# Patient Record
Sex: Female | Born: 1935 | Race: White | Hispanic: No | Marital: Married | Smoking: Never smoker
Health system: Southern US, Community
[De-identification: ages and names within clinical notes are randomized; demographics above are authoritative.]

## PROBLEM LIST (undated history)

## (undated) DIAGNOSIS — K859 Acute pancreatitis without necrosis or infection, unspecified: Secondary | ICD-10-CM

## (undated) DIAGNOSIS — D649 Anemia, unspecified: Secondary | ICD-10-CM

## (undated) DIAGNOSIS — Z8719 Personal history of other diseases of the digestive system: Secondary | ICD-10-CM

## (undated) DIAGNOSIS — R569 Unspecified convulsions: Secondary | ICD-10-CM

## (undated) DIAGNOSIS — N179 Acute kidney failure, unspecified: Secondary | ICD-10-CM

## (undated) DIAGNOSIS — F329 Major depressive disorder, single episode, unspecified: Secondary | ICD-10-CM

## (undated) DIAGNOSIS — I739 Peripheral vascular disease, unspecified: Secondary | ICD-10-CM

## (undated) DIAGNOSIS — L719 Rosacea, unspecified: Secondary | ICD-10-CM

## (undated) DIAGNOSIS — Z9889 Other specified postprocedural states: Secondary | ICD-10-CM

## (undated) DIAGNOSIS — F419 Anxiety disorder, unspecified: Secondary | ICD-10-CM

## (undated) DIAGNOSIS — K219 Gastro-esophageal reflux disease without esophagitis: Secondary | ICD-10-CM

## (undated) DIAGNOSIS — E785 Hyperlipidemia, unspecified: Secondary | ICD-10-CM

## (undated) DIAGNOSIS — M1990 Unspecified osteoarthritis, unspecified site: Secondary | ICD-10-CM

## (undated) DIAGNOSIS — I499 Cardiac arrhythmia, unspecified: Secondary | ICD-10-CM

## (undated) DIAGNOSIS — R112 Nausea with vomiting, unspecified: Secondary | ICD-10-CM

## (undated) DIAGNOSIS — I5032 Chronic diastolic (congestive) heart failure: Secondary | ICD-10-CM

## (undated) DIAGNOSIS — E039 Hypothyroidism, unspecified: Secondary | ICD-10-CM

## (undated) DIAGNOSIS — E079 Disorder of thyroid, unspecified: Secondary | ICD-10-CM

## (undated) DIAGNOSIS — M549 Dorsalgia, unspecified: Secondary | ICD-10-CM

## (undated) DIAGNOSIS — I6529 Occlusion and stenosis of unspecified carotid artery: Secondary | ICD-10-CM

## (undated) DIAGNOSIS — Z5189 Encounter for other specified aftercare: Secondary | ICD-10-CM

## (undated) DIAGNOSIS — K589 Irritable bowel syndrome without diarrhea: Secondary | ICD-10-CM

## (undated) DIAGNOSIS — F32A Depression, unspecified: Secondary | ICD-10-CM

## (undated) DIAGNOSIS — I639 Cerebral infarction, unspecified: Secondary | ICD-10-CM

## (undated) DIAGNOSIS — IMO0001 Reserved for inherently not codable concepts without codable children: Secondary | ICD-10-CM

## (undated) DIAGNOSIS — I10 Essential (primary) hypertension: Secondary | ICD-10-CM

## (undated) DIAGNOSIS — L659 Nonscarring hair loss, unspecified: Secondary | ICD-10-CM

## (undated) HISTORY — DX: Unspecified osteoarthritis, unspecified site: M19.90

## (undated) HISTORY — PX: ERCP: SHX60

## (undated) HISTORY — DX: Anxiety disorder, unspecified: F41.9

## (undated) HISTORY — PX: TONSILLECTOMY: SUR1361

## (undated) HISTORY — DX: Essential (primary) hypertension: I10

## (undated) HISTORY — PX: COLONOSCOPY: SHX174

## (undated) HISTORY — DX: Encounter for other specified aftercare: Z51.89

## (undated) HISTORY — DX: Gastro-esophageal reflux disease without esophagitis: K21.9

## (undated) HISTORY — DX: Reserved for inherently not codable concepts without codable children: IMO0001

## (undated) HISTORY — DX: Disorder of thyroid, unspecified: E07.9

## (undated) HISTORY — DX: Hyperlipidemia, unspecified: E78.5

## (undated) HISTORY — PX: ABDOMINAL HYSTERECTOMY: SHX81

## (undated) HISTORY — DX: Irritable bowel syndrome, unspecified: K58.9

## (undated) HISTORY — PX: BREAST REDUCTION SURGERY: SHX8

## (undated) HISTORY — DX: Dorsalgia, unspecified: M54.9

## (undated) HISTORY — PX: EYE SURGERY: SHX253

---

## 1986-12-13 HISTORY — PX: CHOLECYSTECTOMY: SHX55

## 2000-11-08 ENCOUNTER — Encounter: Admission: RE | Admit: 2000-11-08 | Discharge: 2000-11-08 | Payer: Self-pay | Admitting: *Deleted

## 2000-11-08 ENCOUNTER — Encounter: Payer: Self-pay | Admitting: *Deleted

## 2002-01-02 ENCOUNTER — Encounter (INDEPENDENT_AMBULATORY_CARE_PROVIDER_SITE_OTHER): Payer: Self-pay | Admitting: Specialist

## 2002-01-02 ENCOUNTER — Inpatient Hospital Stay (HOSPITAL_COMMUNITY): Admission: EM | Admit: 2002-01-02 | Discharge: 2002-01-04 | Payer: Self-pay | Admitting: Emergency Medicine

## 2003-01-29 ENCOUNTER — Encounter: Payer: Self-pay | Admitting: Internal Medicine

## 2003-01-29 ENCOUNTER — Encounter: Admission: RE | Admit: 2003-01-29 | Discharge: 2003-01-29 | Payer: Self-pay | Admitting: Internal Medicine

## 2003-12-14 HISTORY — PX: JOINT REPLACEMENT: SHX530

## 2004-02-07 ENCOUNTER — Ambulatory Visit (HOSPITAL_BASED_OUTPATIENT_CLINIC_OR_DEPARTMENT_OTHER): Admission: RE | Admit: 2004-02-07 | Discharge: 2004-02-07 | Payer: Self-pay | Admitting: Plastic Surgery

## 2004-02-07 ENCOUNTER — Ambulatory Visit (HOSPITAL_COMMUNITY): Admission: RE | Admit: 2004-02-07 | Discharge: 2004-02-07 | Payer: Self-pay | Admitting: Plastic Surgery

## 2004-02-07 ENCOUNTER — Encounter (INDEPENDENT_AMBULATORY_CARE_PROVIDER_SITE_OTHER): Payer: Self-pay | Admitting: Specialist

## 2004-02-08 ENCOUNTER — Inpatient Hospital Stay (HOSPITAL_COMMUNITY): Admission: EM | Admit: 2004-02-08 | Discharge: 2004-02-10 | Payer: Self-pay | Admitting: Emergency Medicine

## 2005-06-03 ENCOUNTER — Ambulatory Visit (HOSPITAL_COMMUNITY): Admission: RE | Admit: 2005-06-03 | Discharge: 2005-06-03 | Payer: Self-pay | Admitting: Gastroenterology

## 2007-04-28 ENCOUNTER — Ambulatory Visit: Admission: RE | Admit: 2007-04-28 | Discharge: 2007-04-28 | Payer: Self-pay | Admitting: Orthopedic Surgery

## 2007-05-31 ENCOUNTER — Inpatient Hospital Stay (HOSPITAL_COMMUNITY): Admission: RE | Admit: 2007-05-31 | Discharge: 2007-06-01 | Payer: Self-pay | Admitting: Orthopedic Surgery

## 2008-12-13 HISTORY — PX: BACK SURGERY: SHX140

## 2009-06-23 ENCOUNTER — Encounter: Admission: RE | Admit: 2009-06-23 | Discharge: 2009-06-23 | Payer: Self-pay | Admitting: Internal Medicine

## 2009-10-31 ENCOUNTER — Encounter: Admission: RE | Admit: 2009-10-31 | Discharge: 2009-10-31 | Payer: Self-pay | Admitting: Neurological Surgery

## 2010-06-29 ENCOUNTER — Encounter
Admission: RE | Admit: 2010-06-29 | Discharge: 2010-09-27 | Payer: Self-pay | Source: Home / Self Care | Admitting: Orthopedic Surgery

## 2010-09-28 ENCOUNTER — Encounter
Admission: RE | Admit: 2010-09-28 | Discharge: 2010-12-10 | Payer: Self-pay | Source: Home / Self Care | Attending: Orthopedic Surgery | Admitting: Orthopedic Surgery

## 2010-12-01 ENCOUNTER — Encounter
Admission: RE | Admit: 2010-12-01 | Discharge: 2010-12-08 | Payer: Self-pay | Source: Home / Self Care | Attending: Orthopedic Surgery | Admitting: Orthopedic Surgery

## 2010-12-14 ENCOUNTER — Encounter
Admission: RE | Admit: 2010-12-14 | Discharge: 2011-01-12 | Payer: Self-pay | Source: Home / Self Care | Attending: Orthopedic Surgery | Admitting: Orthopedic Surgery

## 2010-12-29 ENCOUNTER — Encounter: Admit: 2010-12-29 | Payer: Self-pay | Admitting: Orthopedic Surgery

## 2011-01-03 ENCOUNTER — Encounter: Payer: Self-pay | Admitting: Gastroenterology

## 2011-01-20 ENCOUNTER — Ambulatory Visit: Payer: Medicare Other | Attending: Orthopedic Surgery | Admitting: Rehabilitation

## 2011-01-20 DIAGNOSIS — IMO0001 Reserved for inherently not codable concepts without codable children: Secondary | ICD-10-CM | POA: Insufficient documentation

## 2011-01-20 DIAGNOSIS — M6281 Muscle weakness (generalized): Secondary | ICD-10-CM | POA: Insufficient documentation

## 2011-01-20 DIAGNOSIS — M546 Pain in thoracic spine: Secondary | ICD-10-CM | POA: Insufficient documentation

## 2011-01-20 DIAGNOSIS — R262 Difficulty in walking, not elsewhere classified: Secondary | ICD-10-CM | POA: Insufficient documentation

## 2011-01-21 ENCOUNTER — Ambulatory Visit: Payer: Medicare Other | Admitting: Rehabilitation

## 2011-01-26 ENCOUNTER — Ambulatory Visit: Payer: Medicare Other | Admitting: Rehabilitation

## 2011-01-27 ENCOUNTER — Ambulatory Visit: Payer: Medicare Other | Admitting: Rehabilitation

## 2011-01-28 ENCOUNTER — Encounter: Payer: Self-pay | Admitting: Rehabilitation

## 2011-02-03 ENCOUNTER — Ambulatory Visit: Payer: Medicare Other | Admitting: Rehabilitation

## 2011-02-04 ENCOUNTER — Encounter: Payer: Medicare Other | Admitting: Rehabilitation

## 2011-02-09 ENCOUNTER — Ambulatory Visit: Payer: Medicare Other | Admitting: Rehabilitation

## 2011-02-11 ENCOUNTER — Encounter: Payer: Medicare Other | Admitting: Rehabilitation

## 2011-02-24 ENCOUNTER — Encounter: Payer: Medicare Other | Admitting: Rehabilitation

## 2011-03-03 ENCOUNTER — Encounter: Payer: Medicare Other | Admitting: Rehabilitation

## 2011-03-09 ENCOUNTER — Ambulatory Visit: Payer: Medicare Other | Attending: Orthopedic Surgery | Admitting: Rehabilitation

## 2011-03-09 DIAGNOSIS — M6281 Muscle weakness (generalized): Secondary | ICD-10-CM | POA: Insufficient documentation

## 2011-03-09 DIAGNOSIS — IMO0001 Reserved for inherently not codable concepts without codable children: Secondary | ICD-10-CM | POA: Insufficient documentation

## 2011-03-09 DIAGNOSIS — R262 Difficulty in walking, not elsewhere classified: Secondary | ICD-10-CM | POA: Insufficient documentation

## 2011-03-09 DIAGNOSIS — M546 Pain in thoracic spine: Secondary | ICD-10-CM | POA: Insufficient documentation

## 2011-03-17 ENCOUNTER — Encounter: Payer: Medicare Other | Admitting: Rehabilitation

## 2011-03-31 ENCOUNTER — Encounter: Payer: Medicare Other | Admitting: Rehabilitation

## 2011-04-30 NOTE — Discharge Summary (Signed)
[place]. [facility]  Patient:    [name] [name], [name] [name] Visit Number: [id number] MRN: [id number]          Service Type: MED Location: [id number] Attending Physician:  Darnelle Bos Dictated by:   Theressa Millard, M.D. Admit Date:  01/02/2002 Discharge Date: 01/04/2002                             Discharge Summary  ADMISSION DIAGNOSIS:  Lower gastrointestinal bleed.  DISCHARGE DIAGNOSES: 1. Acute gastrointestinal bleed secondary to diverticular bleeding. 2. Acute blood loss anemia. 3. Hypertension. 4. Hypothyroidism.  Please see the admitting history and physical examination for details.  HOSPITAL COURSE:  The patient was admitted and her hemoglobin fell to 8.3. She was transfused with two units of packed cells.  Her hemoglobin came up to 10.8 and subsequently 11.1.  She was then colonoscoped by Dr. Laural Benes with findings of diverticular disease and two small polyps but no malignancy noted. She was discharged in improved condition.  DISCHARGE MEDICATIONS: 1. Lexapro 10 mg q.d. 2. Ativan at bedtime. 3. Tenoretic one daily. 4. Flovent two puffs b.i.d. 5. Prevacid 20 mg q.d. 6. Synthroid as before. 7. Xanax 0.5 mg b.i.d. 8. Lasix 40 mg q.d. 9. K-Dur three q.d.  FOLLOW-UP:  She will call to make and appointment to see me in approximately one week.  DIET:  No restrictions.  ACTIVITY:  No restrictions. Dictated by:   Theressa Millard, M.D. Attending Physician:  Darnelle Bos DD:  01/04/01 TD:  01/05/02 Job: [id number]

## 2011-04-30 NOTE — Op Note (Signed)
[name], [name]                 ACCOUNT NO.:  [id number]   MEDICAL RECORD NO.:  [id number]          PATIENT TYPE:  AMB   LOCATION:  ENDO                         FACILITY:  [facility]   PHYSICIAN:  Danise Edge, M.D.   DATE OF BIRTH:  Mar 23, 1936   DATE OF PROCEDURE:  06/03/2005  DATE OF DISCHARGE:                                 OPERATIVE REPORT   PROCEDURE:  Esophagogastroduodenoscopy with Savary esophageal dilatation.   PROCEDURE INDICATION:  [name] [name] is a 75 year old female, born  08-04-1936. [name] [name] has chronic heartburn and has intermittent solid  food dysphagia.   ENDOSCOPIST:  Danise Edge, M.D.   PREMEDICATION:  Versed 12 mg, Demerol 100 mg.   DESCRIPTION OF PROCEDURE:  After obtaining informed consent, [name] [name] was  placed in the left lateral decubitus position. I administered intravenous  Demerol and intravenous Versed to achieve conscious sedation for the  procedure. The patient's blood pressure, oxygen saturation, and cardiac  rhythm were monitored throughout the procedure and documented in the medical  record.   The Olympus gastroscope was passed through the posterior hypopharynx into  the proximal esophagus without difficulty. The hypopharynx, larynx, and  vocal cords appeared normal.   Esophagoscopy:  The proximal, mid, and lower segments of the esophageal  mucosa appear completely normal. There is no endoscopic evidence for the  presence of Barrett's esophagus, erosive esophagitis, esophageal mucosal  scarring, or esophageal stricture formation.   Gastroscopy:  Retroflex view of the gastric cardia and fundus was normal.  The gastric body, antrum, and pylorus appeared normal. There is a moderate  amount of retained solid food in the gastric body and antrum, compatible  with gastroparesis.  The pylorus is patent. and there are no signs of  gastric outlet obstruction.   Duodenoscopy:  The duodenal bulb and descending duodenum appeared  normal.   Savary esophageal dilation:  The Savary dilator wire was passed through the  gastroscope and the tip of the guide wire advanced to the distal gastric  antrum as confirmed endoscopically and fluoroscopically. Under fluoroscopic  guidance, the 15-mm Savary dilator passed without resistance. Repeat  esophagogastrostomy revealed an intact esophageal mucosa without signs of  stricture, dilation, and no gastric trauma due to the guide wire.   ASSESSMENT:  Chronic gastroesophageal reflux manifested by heartburn  associated with a possible gastroparesis and an esophageal motility disorder  accounting for her intermittent dysphagia.   PLAN:  I will place [name] [name] on sublingual hyoscyamine p.r.n. dysphagia.       MJ/MEDQ  D:  06/03/2005  T:  06/03/2005  Job:  [id number]   cc:   Theressa Millard, M.D.  [address] [place]  [place] [address]  Fax: [phone]

## 2011-04-30 NOTE — Consult Note (Signed)
NAME:  [name] [name], [name] [name]                           ACCOUNT NO.:  [id number]   MEDICAL RECORD NO.:  [id number]                   PATIENT TYPE:  INP   LOCATION:  0358                                 FACILITY:  [facility]   PHYSICIAN:  Georgann Housekeeper, M.D.                 DATE OF BIRTH:  Aug 17, 1936   DATE OF CONSULTATION:  02/09/2004  DATE OF DISCHARGE:                                   CONSULTATION   REFERRING PHYSICIAN:  Dr. Benna Dunks, plastic surgeon.   PRIMARY CARE PHYSICIAN:  Theressa Millard, M.D.   REASON FOR CONSULTATION:  Nausea, vomiting, and hypokalemia.   The patient is 75 years old with a past medical history of hypertension,  hypothyroidism, mild anxiety and had an outpatient procedure with breast  reduction surgery by Dr. Benna Dunks on February 07, 2004 uncomplicated.  Went  home.  The patient felt a little nauseous at the time of discharge, but the  last 24 hours unable to keep anything down, nausea and vomiting, no  diarrhea, no fevers, little bit of burning in the stomach.  No chest pain,  shortness of breath.  She did take some pain pills postop, Tylox, but never  had any problem with codeine.  Has not been able to take anything down.  Came in yesterday with a significant finding of hypokalemia with potassium  2.6.  She has been on Lasix and diuretics at home.  White count was mildly  elevated at 10.3 and mildly elevated liver function SGOT and SGPT.  Amylase  and lipase was negative.   PAST MEDICAL HISTORY:  1. Hypertension.  2. Hypothyroidism.  3. Anxiety.   SURGICAL HISTORY:  1. Gallbladder surgery.  2. Partial hysterectomy and liposuction.  3. Recent breast reduction on February 25th.   Lab data significant:  Potassium was 2.6, today is 3.3.  Normal BUN and  creatinine.  Sodium 141.  LFTs:  SGPT 103, SGOT 49, amylase and lipase 12  and 39 respectively.  UA was negative.   MEDICATION LIST AT HOME:  1. Tenoretic 50/25 mg one per day.  2. Potassium 10 mEq three times a  day.  3. Lasix 40 mg daily.  4. Effexor 37.5 mg q.h.s.  5. Synthroid 0.05 mg daily.   PHYSICAL EXAMINATION:  VITAL SIGNS:  Today, blood pressure 144/79,  temperature 97, 96% saturation, 90 heart rate normal sinus.  LUNGS:  Clear.  NECK:  Supple.  CARDIAC:  Normal S1 & S2 with no murmurs.  ABDOMEN:  Soft, slightly decreased bowel sounds without any tenderness or  distention.  BREASTS:  There are bandages on the breasts from the breast reduction.   LABORATORY DATA:  As mentioned above.  KUB upright obtained today showed no  air/fluid levels, no distention of the colon, some stool.   ASSESSMENT:  1. Nausea, vomiting, and hypokalemia.  Differential includes mild ileus with     some gastroenteritis.  I  suspect more ileus postop.  2. Hypertension.  3. Status post breast reduction surgery.   PLAN/RECOMMENDATION:  1. Continue with supportive care, NPO except meds, hold IV fluids with     potassium replacement.  Recheck BMET in the evening.  2. As far as blood pressure hold off Lasix and Tenoretic.  3. Hypothyroidism:  Continue on Synthroid and the Effexor.  4. The patient does not feel to be in pain.  She does not need any pain     medication at this point.  Will follow.                                               Georgann Housekeeper, M.D.    Arliss Journey  D:  02/09/2004  T:  02/09/2004  Job:  [id number]   cc:   Alfredia Ferguson, M.D.  [address] [address]  [place]  [place] [address]  Fax: [phone]

## 2011-04-30 NOTE — Procedures (Signed)
[place]. [facility]  Patient:    [name] [name], [name] [name] Visit Number: [id number] MRN: [id number]          Service Type: MED Location: [id number] Attending Physician:  Darnelle Bos Dictated by:   Verlin Grills, M.D. Proc. Date: 01/04/02 Admit Date:  01/02/2002 Discharge Date: 01/04/2002   CC:         Theressa Millard, M.D.                           Procedure Report  PROCEDURE:  Colonoscopy and polypectomy.  ENDOSCOPIST:  Verlin Grills, M.D.  INDICATIONS:  [name] [name] is a 75 year old female born Jun 04, 1936. She was hospitalized with hematochezia which has resolved.  PREMEDICATION:  Versed 15 mg, fentanyl 100 mcg.  ENDOSCOPE:  Olympus pediatric colonoscope.  DESCRIPTION OF PROCEDURE:  After obtaining informed consent, the patient was placed in the left lateral decubitus position.  I administered intravenous fentanyl and intravenous Versed to achieve conscious sedation for the procedure.  The patients blood pressure, oxygen saturation and cardiac rhythm were monitored throughout the procedure and documented in the medical record.  Anal inspection was normal.  Digital rectal exam was normal.  The Olympus pediatric videocolonoscope was introduced into the rectum and easily advanced to the cecum.  Colonic preparation for the exam today was satisfactory.  Rectum normal.  Sigmoid colon and descending colon:  From the distal sigmoid colon, a 1 mm sessile polyp was removed with electrocautery snare but not retrieved for pathological evaluation.  Left colonic diverticulosis noted.  Splenic flexure negative.  Transverse colon normal.  Hepatic flexure normal.  Ascending colon:  From the mid ascending colon a 1 mm sessile polyp was removed e with the electrocautery snare and submitted for pathologic interpretation.  Cecum and ileocecal valve:  Normal.  ASSESSMENT: 1. Left colonic diverticulosis. 2. A 1 mm sessile polyp  was removed from the mid ascending colon with    electrocautery snare and submitted for pathologic interpretation. 3. From the distal sigmoid colon, a 1 mm sessile polyp was removed with the    electrocautery snare but not retrieved for pathological evaluation.  RECOMMENDATIONS:  If ascending colon polyp returns neoplastic, [name] [name] should undergo a repeat colonoscopy in five years.  If her polyp is non-neoplastic, she should undergo a repeat colonoscopy in 10 years. Dictated by:   Verlin Grills, M.D. Attending Physician:  Darnelle Bos DD:  01/04/02 TD:  01/05/02 Job: [id number]

## 2011-09-29 LAB — CBC
HCT: 30.6 — ABNORMAL LOW
Hemoglobin: 10.4 — ABNORMAL LOW
MCV: 89.8
Platelets: 245
RDW: 12.3

## 2011-09-29 LAB — BASIC METABOLIC PANEL
BUN: 36 — ABNORMAL HIGH
Chloride: 99
Glucose, Bld: 146 — ABNORMAL HIGH
Potassium: 2.9 — ABNORMAL LOW
Sodium: 135

## 2011-09-30 LAB — COMPREHENSIVE METABOLIC PANEL
Albumin: 4.3
Alkaline Phosphatase: 48
BUN: 45 — ABNORMAL HIGH
Calcium: 9.8
Glucose, Bld: 97
Potassium: 4.3
Sodium: 141
Total Protein: 7.1

## 2011-09-30 LAB — URINE MICROSCOPIC-ADD ON

## 2011-09-30 LAB — PROTIME-INR
INR: 1
Prothrombin Time: 13

## 2011-09-30 LAB — CBC
HCT: 35.6 — ABNORMAL LOW
MCHC: 34.1
Platelets: 282
RDW: 12.6

## 2011-09-30 LAB — URINALYSIS, ROUTINE W REFLEX MICROSCOPIC
Hgb urine dipstick: NEGATIVE
Nitrite: NEGATIVE
Protein, ur: NEGATIVE
Specific Gravity, Urine: 1.01 (ref 1.005–1.035)
Urobilinogen, UA: 0.2

## 2012-02-15 DIAGNOSIS — E039 Hypothyroidism, unspecified: Secondary | ICD-10-CM | POA: Diagnosis not present

## 2012-02-15 DIAGNOSIS — I10 Essential (primary) hypertension: Secondary | ICD-10-CM | POA: Diagnosis not present

## 2012-02-15 DIAGNOSIS — N183 Chronic kidney disease, stage 3 unspecified: Secondary | ICD-10-CM | POA: Diagnosis not present

## 2012-02-15 DIAGNOSIS — R1031 Right lower quadrant pain: Secondary | ICD-10-CM | POA: Diagnosis not present

## 2012-02-15 DIAGNOSIS — Z1331 Encounter for screening for depression: Secondary | ICD-10-CM | POA: Diagnosis not present

## 2012-02-21 DIAGNOSIS — M545 Low back pain: Secondary | ICD-10-CM | POA: Diagnosis not present

## 2012-02-22 ENCOUNTER — Other Ambulatory Visit: Payer: Self-pay | Admitting: Internal Medicine

## 2012-02-28 ENCOUNTER — Ambulatory Visit
Admission: RE | Admit: 2012-02-28 | Discharge: 2012-02-28 | Disposition: A | Discharge status: Home / Self Care | Payer: Medicare Other | Source: Ambulatory Visit | Attending: Internal Medicine | Admitting: Internal Medicine

## 2012-02-28 DIAGNOSIS — R109 Unspecified abdominal pain: Secondary | ICD-10-CM | POA: Diagnosis not present

## 2012-02-28 DIAGNOSIS — K5730 Diverticulosis of large intestine without perforation or abscess without bleeding: Secondary | ICD-10-CM | POA: Diagnosis not present

## 2012-02-28 MED ORDER — IOHEXOL 300 MG/ML  SOLN
100.0000 mL | Freq: Once | INTRAMUSCULAR | Status: AC | PRN
  Administered 2012-02-28: 100 mL via INTRAVENOUS

## 2012-03-20 ENCOUNTER — Encounter (INDEPENDENT_AMBULATORY_CARE_PROVIDER_SITE_OTHER): Payer: Self-pay | Admitting: General Surgery

## 2012-03-22 ENCOUNTER — Ambulatory Visit (INDEPENDENT_AMBULATORY_CARE_PROVIDER_SITE_OTHER): Payer: Self-pay | Admitting: General Surgery

## 2012-03-23 ENCOUNTER — Telehealth (INDEPENDENT_AMBULATORY_CARE_PROVIDER_SITE_OTHER): Payer: Self-pay | Admitting: General Surgery

## 2012-03-23 ENCOUNTER — Encounter (INDEPENDENT_AMBULATORY_CARE_PROVIDER_SITE_OTHER): Payer: Self-pay | Admitting: General Surgery

## 2012-03-24 ENCOUNTER — Telehealth (INDEPENDENT_AMBULATORY_CARE_PROVIDER_SITE_OTHER): Payer: Self-pay

## 2012-03-24 NOTE — Telephone Encounter (Signed)
Pt notified that Dr Derrell Lolling has reviewed this case with Dr Michaell Cowing and we will be making appt with Him to see pt. Pt advised since this hernia is in an unusual area Dr Michaell Cowing will be seeing her. Info will be given to Alisha to call pt for appt.

## 2012-03-28 ENCOUNTER — Emergency Department (HOSPITAL_COMMUNITY): Payer: Medicare Other

## 2012-03-28 ENCOUNTER — Emergency Department (HOSPITAL_COMMUNITY)
Admission: EM | Admit: 2012-03-28 | Discharge: 2012-03-28 | Disposition: A | Discharge status: Home / Self Care | Payer: Medicare Other | Attending: Emergency Medicine | Admitting: Emergency Medicine

## 2012-03-28 ENCOUNTER — Encounter (HOSPITAL_COMMUNITY): Payer: Self-pay

## 2012-03-28 DIAGNOSIS — E079 Disorder of thyroid, unspecified: Secondary | ICD-10-CM | POA: Diagnosis not present

## 2012-03-28 DIAGNOSIS — I440 Atrioventricular block, first degree: Secondary | ICD-10-CM | POA: Diagnosis not present

## 2012-03-28 DIAGNOSIS — F4489 Other dissociative and conversion disorders: Secondary | ICD-10-CM | POA: Diagnosis not present

## 2012-03-28 DIAGNOSIS — K219 Gastro-esophageal reflux disease without esophagitis: Secondary | ICD-10-CM | POA: Diagnosis not present

## 2012-03-28 DIAGNOSIS — K589 Irritable bowel syndrome without diarrhea: Secondary | ICD-10-CM | POA: Diagnosis not present

## 2012-03-28 DIAGNOSIS — N189 Chronic kidney disease, unspecified: Secondary | ICD-10-CM | POA: Diagnosis not present

## 2012-03-28 DIAGNOSIS — R4182 Altered mental status, unspecified: Secondary | ICD-10-CM | POA: Insufficient documentation

## 2012-03-28 DIAGNOSIS — R51 Headache: Secondary | ICD-10-CM | POA: Diagnosis not present

## 2012-03-28 DIAGNOSIS — E785 Hyperlipidemia, unspecified: Secondary | ICD-10-CM | POA: Diagnosis not present

## 2012-03-28 DIAGNOSIS — I129 Hypertensive chronic kidney disease with stage 1 through stage 4 chronic kidney disease, or unspecified chronic kidney disease: Secondary | ICD-10-CM | POA: Diagnosis not present

## 2012-03-28 DIAGNOSIS — F29 Unspecified psychosis not due to a substance or known physiological condition: Secondary | ICD-10-CM | POA: Insufficient documentation

## 2012-03-28 DIAGNOSIS — Z0389 Encounter for observation for other suspected diseases and conditions ruled out: Secondary | ICD-10-CM | POA: Diagnosis not present

## 2012-03-28 DIAGNOSIS — M129 Arthropathy, unspecified: Secondary | ICD-10-CM | POA: Diagnosis not present

## 2012-03-28 DIAGNOSIS — M545 Low back pain: Secondary | ICD-10-CM | POA: Diagnosis not present

## 2012-03-28 LAB — POCT I-STAT, CHEM 8
BUN: 37 mg/dL — ABNORMAL HIGH (ref 6–23)
Calcium, Ion: 1.16 mmol/L (ref 1.12–1.32)
Chloride: 99 mEq/L (ref 96–112)
Creatinine, Ser: 1.1 mg/dL (ref 0.50–1.10)
Glucose, Bld: 118 mg/dL — ABNORMAL HIGH (ref 70–99)
HCT: 37 % (ref 36.0–46.0)
Potassium: 3.4 mEq/L — ABNORMAL LOW (ref 3.5–5.1)

## 2012-03-28 MED ORDER — ASPIRIN 81 MG PO CHEW
324.0000 mg | CHEWABLE_TABLET | Freq: Once | ORAL | Status: AC
  Administered 2012-03-28: 324 mg via ORAL
  Filled 2012-03-28: qty 4

## 2012-03-28 NOTE — ED Notes (Signed)
Per ems. Pt husband called stating that his wife was standing by the microwave and as he was trying to talk to her she was not responding and he states she did not recognize him.

## 2012-03-28 NOTE — Discharge Instructions (Signed)
I would like for you to take one aspirin daily and followup with your family doctor as soon as possible   Confusion Confusion is the inability to think with your usual speed or clarity. Confusion may come on quickly or slowly over time. How quickly the confusion comes on depends on the cause. Confusion can be due to any number of causes. CAUSES   Concussion, head injury, or head trauma.   Seizures.   Stroke.   Fever.   Senility.   Heightened emotional states like rage or terror.   Mental illness in which the person loses the ability to determine what is real and what is not (hallucinations).   Infections.   Toxic effects from alcohol, drugs, or prescription medicines.   Dehydration and an imbalance of salts in the body (electrolytes).   Lack of sleep.   Low blood sugar (diabetes).   Low levels of oxygen (for example from chronic lung disorders).   Drug interactions or other medication side effects.   Nutritional deficiencies, especially niacin, thiamine, vitamin C, or vitamin B.   Sudden drop in body temperature (hypothermia).   Illness in the elderly. Constipation can result in confusion. An elderly person who is hospitalized may become confused due to change in daily routine.  SYMPTOMS  People often describe their thinking as cloudy or unclear when they are confused. Confusion can also include feeling disoriented. That means you are unaware of where or who you are. You may also not know what the date or time is. If confused, you may also have difficulty paying attention, remembering and making decisions. Some people also act aggressively when they are confused.  DIAGNOSIS  The medical evaluation of confusion may include:  Blood and urine tests.   X-rays.   Brain and nervous system tests.   Analyzing your brain waves (electroencphalogram or EEG).   A special X-ray (MRI) of your head or other special studies.  Your physician will ask questions such as:  Do you  get days and nights mixed up?   Are you awake during regular sleep times?   Do you have trouble recognizing people?   Do you know where you are?   Do you know the date and time?   Does the confusion come and go?   Is the confusion quickly getting worse?   Has there been a recent illness?   Has there been a recent head injury?   Are you diabetic?   Do you have a lung disorder?   What medication are you taking?   Have you taken drugs or alcohol?  TREATMENT  An admission to the hospital may not be needed, but a confused person should not be left alone. Stay with a family member or friend until the confusion clears. Avoid alcohol, pain relievers or sedative drugs until you have fully recovered. Do not drive until your caregiver says it is okay. HOME CARE INSTRUCTIONS What family and friends can do:  To find out if someone is confused ask him or her their name, age, and the date. If the person is unsure or answers incorrectly, he or she is confused.   Always introduce yourself, no matter how well the person knows you.   Often remind the person of his or her location.   Place a calendar and clock near the confused person.   Talk about current events and plans for the day.   Try to keep the environment calm, quiet and peaceful.   Make sure the patient  keeps follow up appointments with their physician.  PREVENTION  Ways to prevent confusion:  Avoid alcohol.   Eat a balanced diet.   Get enough sleep.   Do not become isolated. Spend time with other people and make plans for your days.   Keep careful watch on your blood sugar levels if you are diabetic.  SEEK IMMEDIATE MEDICAL CARE IF:   You develop severe headaches, repeated vomiting, seizures, blackouts or slurred speech.   There is increasing confusion, weakness, numbness, restlessness or personality changes.   You develop a loss of balance, have marked dizziness, feel uncoordinated or fall.   You have  delusions, hallucinations or develop severe anxiety.   Your family members think you need to be rechecked.  Document Released: 01/06/2005 Document Revised: 11/18/2011 Document Reviewed: 09/03/2008 [facility] Patient Information 2012 [place], [place].

## 2012-03-28 NOTE — ED Provider Notes (Signed)
History     CSN: [id number]  Arrival date & time 03/28/12  8469   First MD Initiated Contact with Patient 03/28/12 [phone]      Chief Complaint  Patient presents with  . Altered Mental Status     HPI Per ems. Pt husband called stating that his wife was standing by the microwave and as he was trying to talk to her she was not responding and he states she did not recognize him.  Patient shortly returned to baseline and now has no complaints.  Patient does not have recollection of the events during this brief period.  Patient also states that she did take her pain medicines this morning which she normally does.  Patient suffers from chronic back pain.  Patient denies any lateralizing weakness, vision problems numbness or speech problems.  Past Medical History  Diagnosis Date  . Hypertension   . Hyperlipidemia   . Thyroid disease   . Arthritis   . GERD (gastroesophageal reflux disease)   . Anxiety   . IBS (irritable bowel syndrome)   . Back pain   . Chronic kidney disease     Past Surgical History  Procedure Date  . Back surgery   . Cholecystectomy     No family history on file.  History  Substance Use Topics  . Smoking status: Never Smoker   . Smokeless tobacco: Not on file  . Alcohol Use: Yes    OB History    Grav Para Term Preterm Abortions TAB SAB Ect Mult Living                  Review of Systems  All other systems reviewed and are negative.    Allergies  Yellow dyes (non-tartrazine)  Home Medications   Current Outpatient Rx  Name Route Sig Dispense Refill  . ALPRAZOLAM 1 MG PO TABS Oral Take 1 mg by mouth 4 (four) times daily.    . ATENOLOL 50 MG PO TABS Oral Take 50 mg by mouth daily.    . BUSPIRONE HCL 15 MG PO TABS Oral Take 30 mg by mouth 2 (two) times daily.    . CYCLOBENZAPRINE HCL 10 MG PO TABS Oral Take 10 mg by mouth every 8 (eight) hours as needed. For muscle spasms.    [name] ESOMEPRAZOLE MAGNESIUM 20 MG PO PACK Oral Take 20 mg by mouth daily.      . FENOFIBRATE 145 MG PO TABS Oral Take 145 mg by mouth daily.    . FUROSEMIDE 20 MG PO TABS Oral Take 20 mg by mouth daily.     [name] HYDROCHLOROTHIAZIDE 25 MG PO TABS Oral Take 25 mg by mouth daily.    [name] HYOSCYAMINE SULFATE ER 0.375 MG PO TB12 Oral Take 0.375 mg by mouth every 12 (twelve) hours as needed.    [name] LEVOTHYROXINE SODIUM 112 MCG PO TABS Oral Take 112 mcg by mouth daily.    [name] LIDOCAINE 5 % EX PTCH Transdermal Place 1-3 patches onto the skin daily. Remove & Discard patch within 12 hours or as directed by MD    . OXYCODONE HCL 15 MG PO TABS Oral Take 15-30 mg by mouth every 6 (six) hours as needed. For pain.    [name] POTASSIUM CHLORIDE CRYS ER 20 MEQ PO TBCR Oral Take 20 mEq by mouth 2 (two) times daily.    . VENLAFAXINE HCL ER 150 MG PO CP24 Oral Take 150 mg by mouth daily.    [name] ZOLPIDEM TARTRATE 10 MG  PO TABS Oral Take 10 mg by mouth at bedtime as needed. For sleep.      BP 115/45  Pulse 63  Temp(Src) 98.3 F (36.8 C) (Oral)  Resp 15  SpO2 95%  Physical Exam  Nursing note and vitals reviewed. Constitutional: She is oriented to person, place, and time. She appears well-developed and well-nourished. No distress.  HENT:  Head: Normocephalic and atraumatic.  Eyes: Pupils are equal, round, and reactive to light.  Neck: Normal range of motion.  Cardiovascular: Normal rate and intact distal pulses.          Date: 03/28/2012  Rate: 65  Rhythm: normal sinus rhythm  QRS Axis: normal  Intervals: PR prolonged  ST/T Wave abnormalities: nonspecific T wave changes  Conduction Disutrbances:first-degree A-V block :   Old EKG Reviewed: changes noted     Pulmonary/Chest: No respiratory distress.  Abdominal: Normal appearance. She exhibits no distension.  Musculoskeletal: Normal range of motion.  Neurological: She is alert and oriented to person, place, and time. No cranial nerve deficit or sensory deficit. GCS eye subscore is 4. GCS verbal subscore is 5. GCS motor subscore is 6.  Skin:  Skin is warm and dry. No rash noted.  Psychiatric: She has a normal mood and affect. Her behavior is normal.    ED Course  Procedures (including critical care time)  Labs Reviewed  POCT I-STAT, CHEM 8 - Abnormal; Notable for the following:    Potassium 3.4 (*)    BUN 37 (*)    Glucose, Bld 118 (*)    All other components within normal limits  LAB REPORT - SCANNED   Ct Head Wo Contrast  03/28/2012  *RADIOLOGY REPORT*  Clinical Data: Confusion. Transient inability to speak. Pain in the head.  CT HEAD WITHOUT CONTRAST  Technique:  Contiguous axial images were obtained from the base of the skull through the vertex without contrast.  Comparison: None.  Findings: The brain stem, cerebellum, cerebral peduncles, thalami, basal ganglia, basilar cisterns, and ventricular system appear unremarkable.  No intracranial hemorrhage, mass lesion, or acute infarction is identified.  The visualized paranasal sinuses appear clear.  IMPRESSION:  No significant abnormality identified.  Original Report Authenticated By: Dellia Cloud, M.D.     1. Change in mental status       MDM         Nelia Shi, MD 03/29/12 [phone]

## 2012-04-18 ENCOUNTER — Ambulatory Visit (INDEPENDENT_AMBULATORY_CARE_PROVIDER_SITE_OTHER): Payer: Medicare Other | Admitting: Surgery

## 2012-04-18 ENCOUNTER — Encounter (INDEPENDENT_AMBULATORY_CARE_PROVIDER_SITE_OTHER): Payer: Self-pay | Admitting: Surgery

## 2012-04-18 VITALS — BP 132/68 | HR 60 | Temp 97.2°F | Resp 12 | Ht 61.0 in | Wt 138.2 lb

## 2012-04-18 DIAGNOSIS — K458 Other specified abdominal hernia without obstruction or gangrene: Secondary | ICD-10-CM | POA: Diagnosis not present

## 2012-04-18 NOTE — Patient Instructions (Signed)
Hernia  A hernia occurs when an internal organ pushes out through a weak spot in the abdominal wall. Hernias most commonly occur in the groin and around the navel. Hernias often can be pushed back into place (reduced). Most hernias tend to get worse over time. Some abdominal hernias can get stuck in the opening (irreducible or incarcerated hernia) and cannot be reduced. An irreducible abdominal hernia which is tightly squeezed into the opening is at risk for impaired blood supply (strangulated hernia). A strangulated hernia is a medical emergency. Because of the risk for an irreducible or strangulated hernia, surgery may be recommended to repair a hernia.  CAUSES    Heavy lifting.   Prolonged coughing.   Straining to have a bowel movement.   A cut (incision) made during an abdominal surgery.  HOME CARE INSTRUCTIONS    Bed rest is not required. You may continue your normal activities.   Avoid lifting more than 10 pounds (4.5 kg) or straining.   Cough gently. If you are a smoker it is best to stop. Even the best hernia repair can break down with the continual strain of coughing. Even if you do not have your hernia repaired, a cough will continue to aggravate the problem.   Do not wear anything tight over your hernia. Do not try to keep it in with an outside bandage or truss. These can damage abdominal contents if they are trapped within the hernia sac.   Eat a normal diet.   Avoid constipation. Straining over long periods of time will increase hernia size and encourage breakdown of repairs. If you cannot do this with diet alone, stool softeners may be used.  SEEK IMMEDIATE MEDICAL CARE IF:    You have a fever.   You develop increasing abdominal pain.   You feel nauseous or vomit.   Your hernia is stuck outside the abdomen, looks discolored, feels hard, or is tender.   You have any changes in your bowel habits or in the hernia that are unusual for you.   You have increased pain or swelling around the  hernia.   You cannot push the hernia back in place by applying gentle pressure while lying down.  MAKE SURE YOU:    Understand these instructions.   Will watch your condition.   Will get help right away if you are not doing well or get worse.  Document Released: 11/29/2005 Document Revised: 11/18/2011 Document Reviewed: 07/18/2008  [employer] Patient Information 2012 [employer].

## 2012-04-18 NOTE — Progress Notes (Signed)
Subjective:     Patient ID: [name] [name], female   DOB: Aug 29, 1936, 76 y.o.   MRN: [id number]  HPI  KATHIE POSA  03/12/1936 [id number]  Patient Care Team: Darnelle Bos, MD as PCP - General (Internal Medicine)  This patient is a 76 y.o.female who presents today for surgical evaluation at the request of Dr. Earl Gala.   Reason for visit: Right posterior flank hernia.  The patient is a pleasant elderly woman with numerous back issues including scoliosis. She was referred by Dr. Yetta Barre to [facility]. She underwent complex back surgery with bilateral releases. She recovered from that. That was back in 2010. However, she noticed an abnormal lump on her right side. It has gotten bigger. It is uncomfortable for her. No nausea or vomiting. No fevers or chills. Pain can radiate around the side.  She noticed her primary care physician. CAT scan revealed evidence of small bowel exiting the right posterior flank consistent with a flank hernia. She was sent to [place] for surgical evaluation.  Patient Active Problem List  Diagnoses  . Hernia of Right posterior flank    Past Medical History  Diagnosis Date  . Hypertension   . Hyperlipidemia   . Thyroid disease   . Arthritis   . GERD (gastroesophageal reflux disease)   . Anxiety   . IBS (irritable bowel syndrome)   . Back pain   . Chronic kidney disease   . Blood transfusion     at age of 4    Past Surgical History  Procedure Date  . Back surgery   . Cholecystectomy   . Tonsillectomy   . Abdominal hysterectomy     partial    History   Social History  . Marital Status: Married    Spouse Name: N/A    Number of Children: N/A  . Years of Education: N/A   Occupational History  . Not on file.   Social History Main Topics  . Smoking status: Never Smoker   . Smokeless tobacco: Not on file  . Alcohol Use: Yes  . Drug Use: No  . Sexually Active:    Other Topics Concern  . Not on file   Social History Narrative   . No narrative on file    Family History  Problem Relation Age of Onset  . Heart disease Mother   . Cancer Father     lung    Current Outpatient Prescriptions  Medication Sig Dispense Refill  . ALPRAZolam (XANAX) 1 MG tablet Take 1 mg by mouth 4 (four) times daily.      [name] atenolol (TENORMIN) 50 MG tablet Take 50 mg by mouth daily.      . busPIRone (BUSPAR) 15 MG tablet Take 30 mg by mouth 2 (two) times daily.      . cyclobenzaprine (FLEXERIL) 10 MG tablet Take 10 mg by mouth every 8 (eight) hours as needed. For muscle spasms.      [name] esomeprazole (NEXIUM) 20 MG packet Take 20 mg by mouth daily.       . fenofibrate (TRICOR) 145 MG tablet Take 145 mg by mouth daily.      . furosemide (LASIX) 20 MG tablet Take 20 mg by mouth daily.       . hydrochlorothiazide (HYDRODIURIL) 25 MG tablet Take 25 mg by mouth daily.      . hyoscyamine (LEVBID) 0.375 MG 12 hr tablet Take 0.375 mg by mouth every 12 (twelve) hours as needed.      [name]  levothyroxine (SYNTHROID, LEVOTHROID) 112 MCG tablet Take 112 mcg by mouth daily.      [name] lidocaine (LIDODERM) 5 % Place 1-3 patches onto the skin daily. Remove & Discard patch within 12 hours or as directed by MD      . oxyCODONE (ROXICODONE) 15 MG immediate release tablet Take 15-30 mg by mouth every 6 (six) hours as needed. For pain.      . potassium chloride SA (K-DUR,KLOR-CON) 20 MEQ tablet Take 20 mEq by mouth 2 (two) times daily.      [name] venlafaxine (EFFEXOR-XR) 150 MG 24 hr capsule Take 150 mg by mouth daily.      [name] zolpidem (AMBIEN) 10 MG tablet Take 10 mg by mouth at bedtime as needed. For sleep.         Allergies  Allergen Reactions  . Yellow Dyes (Non-Tartrazine) Itching    BP 132/68  Pulse 60  Temp(Src) 97.2 F (36.2 C) (Temporal)  Resp 12  Ht 5\' 1"  (1.549 m)  Wt 138 lb 3.2 oz (62.687 kg)  BMI 26.11 kg/m2     Review of Systems  Constitutional: Negative for fever, chills, diaphoresis, appetite change and fatigue.  HENT: Positive for hearing  loss. Negative for ear pain, sore throat, trouble swallowing, neck pain and ear discharge.   Eyes: Negative for photophobia, discharge and visual disturbance.  Respiratory: Negative for cough, choking, chest tightness and shortness of breath.   Cardiovascular: Negative for chest pain and palpitations.       Patient walks 60 minutes for about 2 miles without difficulty on treadmill.  No exertional chest/neck/shoulder/arm pain.   Gastrointestinal: Negative for nausea, vomiting, abdominal pain, diarrhea, constipation, anal bleeding and rectal pain.       No personal nor family history of GI/colon cancer, inflammatory bowel disease, allergy such as Celiac Sprue, dietary/dairy problems, colitis, ulcers nor gastritis.   Possible IBS but no severe Constipation/Diarrhea  No recent sick contacts/gastroenteritis.  No travel outside the country.  No changes in diet.    Genitourinary: Negative for dysuria, frequency and difficulty urinating.  Musculoskeletal: Positive for myalgias and back pain. Negative for gait problem.  Skin: Negative for color change, pallor and rash.  Neurological: Negative for dizziness, speech difficulty, weakness and numbness.  Hematological: Negative for adenopathy. Bruises/bleeds easily.  Psychiatric/Behavioral: Negative for confusion and agitation. The patient is not nervous/anxious.        Objective:   Physical Exam  Constitutional: She is oriented to person, place, and time. She appears well-developed and well-nourished. No distress.  HENT:  Head: Normocephalic.  Mouth/Throat: Oropharynx is clear and moist. No oropharyngeal exudate.  Eyes: Conjunctivae and EOM are normal. Pupils are equal, round, and reactive to light. No scleral icterus.  Neck: Normal range of motion. Neck supple. No tracheal deviation present.  Cardiovascular: Normal rate, regular rhythm and intact distal pulses.   Pulmonary/Chest: Effort normal and breath sounds normal. No respiratory distress. She  exhibits no tenderness.  Abdominal: Soft. She exhibits no distension and no mass. There is no tenderness. Hernia confirmed negative in the right inguinal area and confirmed negative in the left inguinal area.  Genitourinary: No vaginal discharge found.  Musculoskeletal: Normal range of motion. She exhibits no tenderness.       Arms: Lymphadenopathy:    She has no cervical adenopathy.       Right: No inguinal adenopathy present.       Left: No inguinal adenopathy present.  Neurological: She is alert and oriented to person, place,  and time. No cranial nerve deficit. She exhibits normal muscle tone. Coordination normal.  Skin: Skin is warm and dry. No rash noted. She is not diaphoretic. No erythema.  Psychiatric: She has a normal mood and affect. Her behavior is normal. Judgment and thought content normal.       Assessment:     Right posterior flank hernia s/p numerous back surgeries    Plan:     I think she would benefit from repair especially since a loop of intestine is going up into this hernia. It is causing a lot of pain and discomfort in her. She and her husband are very interested in getting this repaired. It is reasonable start a laparoscopic approach with her in a 90 decubitus position. Probably split at the kidney level to help open up the area.  The anatomy & physiology of the abdominal wall was discussed.  The pathophysiology of hernias was discussed.  Natural history risks without surgery including progeressive enlargement, pain, incarceration & strangulation was discussed.   Contributors to complications such as smoking, obesity, diabetes, prior surgery, etc were discussed.   I feel the risks of no intervention will lead to serious problems that outweigh the operative risks; therefore, I recommended surgery to reduce and repair the hernia.  I explained laparoscopic techniques with possible need for an open approach.  I noted the probable use of mesh to patch and/or buttress the  hernia repair  Risks such as bleeding, infection, abscess, need for further treatment, heart attack, death, and other risks were discussed.  I noted a good likelihood this will help address the problem.   Goals of post-operative recovery were discussed as well.  Possibility that this will not correct all symptoms was explained.  I stressed the importance of low-impact activity, aggressive pain control, avoiding constipation, & not pushing through pain to minimize risk of post-operative chronic pain or injury. Possibility of reherniation especially with smoking, obesity, diabetes, immunosuppression, and other health conditions was discussed.  We will work to minimize complications.     An educational handout further explaining the pathology & treatment options was given as well.  Questions were answered.  The patient expresses understanding & wishes to proceed with surgery.

## 2012-04-26 DIAGNOSIS — M545 Low back pain: Secondary | ICD-10-CM | POA: Diagnosis not present

## 2012-05-29 ENCOUNTER — Encounter (HOSPITAL_COMMUNITY): Payer: Self-pay | Admitting: Pharmacy Technician

## 2012-06-01 ENCOUNTER — Encounter (HOSPITAL_COMMUNITY)
Admission: RE | Admit: 2012-06-01 | Discharge: 2012-06-01 | Disposition: A | Discharge status: Home / Self Care | Payer: Medicare Other | Source: Ambulatory Visit | Attending: Surgery | Admitting: Surgery

## 2012-06-01 ENCOUNTER — Encounter (HOSPITAL_COMMUNITY): Payer: Self-pay

## 2012-06-01 DIAGNOSIS — K660 Peritoneal adhesions (postprocedural) (postinfection): Secondary | ICD-10-CM | POA: Diagnosis not present

## 2012-06-01 DIAGNOSIS — R109 Unspecified abdominal pain: Secondary | ICD-10-CM | POA: Diagnosis not present

## 2012-06-01 DIAGNOSIS — F411 Generalized anxiety disorder: Secondary | ICD-10-CM | POA: Diagnosis not present

## 2012-06-01 DIAGNOSIS — K219 Gastro-esophageal reflux disease without esophagitis: Secondary | ICD-10-CM | POA: Diagnosis not present

## 2012-06-01 DIAGNOSIS — I10 Essential (primary) hypertension: Secondary | ICD-10-CM | POA: Diagnosis not present

## 2012-06-01 DIAGNOSIS — Z79899 Other long term (current) drug therapy: Secondary | ICD-10-CM | POA: Diagnosis not present

## 2012-06-01 DIAGNOSIS — K589 Irritable bowel syndrome without diarrhea: Secondary | ICD-10-CM | POA: Diagnosis not present

## 2012-06-01 DIAGNOSIS — K432 Incisional hernia without obstruction or gangrene: Secondary | ICD-10-CM | POA: Diagnosis not present

## 2012-06-01 DIAGNOSIS — M549 Dorsalgia, unspecified: Secondary | ICD-10-CM | POA: Diagnosis not present

## 2012-06-01 DIAGNOSIS — K4090 Unilateral inguinal hernia, without obstruction or gangrene, not specified as recurrent: Secondary | ICD-10-CM | POA: Diagnosis not present

## 2012-06-01 DIAGNOSIS — E785 Hyperlipidemia, unspecified: Secondary | ICD-10-CM | POA: Diagnosis not present

## 2012-06-01 DIAGNOSIS — E039 Hypothyroidism, unspecified: Secondary | ICD-10-CM | POA: Diagnosis not present

## 2012-06-01 DIAGNOSIS — M129 Arthropathy, unspecified: Secondary | ICD-10-CM | POA: Diagnosis not present

## 2012-06-01 HISTORY — DX: Anemia, unspecified: D64.9

## 2012-06-01 HISTORY — DX: Hypothyroidism, unspecified: E03.9

## 2012-06-01 HISTORY — DX: Personal history of other diseases of the digestive system: Z87.19

## 2012-06-01 LAB — BASIC METABOLIC PANEL
CO2: 32 mEq/L (ref 19–32)
Chloride: 98 mEq/L (ref 96–112)
Creatinine, Ser: 1.08 mg/dL (ref 0.50–1.10)
Glucose, Bld: 110 mg/dL — ABNORMAL HIGH (ref 70–99)

## 2012-06-01 LAB — CBC
Hemoglobin: 13.8 g/dL (ref 12.0–15.0)
MCH: 29.1 pg (ref 26.0–34.0)
MCV: 86.3 fL (ref 78.0–100.0)
Platelets: 260 10*3/uL (ref 150–400)
RBC: 4.75 MIL/uL (ref 3.87–5.11)
WBC: 8.6 10*3/uL (ref 4.0–10.5)

## 2012-06-01 NOTE — Pre-Procedure Instructions (Signed)
LOV of 02/15/12 with PCP - Dr Theressa Millard on chart

## 2012-06-01 NOTE — Progress Notes (Signed)
06/01/12 Dr Michaell Cowing ,       Just wanted to give you some information regarding this patient if you are not already aware.   Patient presented to ER on 03/28/12 with " Altered Mental Status" . CT of head done 03/28/12 - negative. Pt had EKG done with confirmed EKG reading " Consider anterior infarct. "  On 03/28/12.  Pt has had not followup with PCP done since ER visit of 03/28/12.  Last visit with Dr Earl Gala who is PCP was on 02/15/12.  Pt does not report any problems on preop visit done today.        Also I noted on CT abdomen/pelvis done 02/28/12 lung nodule noted in left lung.        Followup chest CT was recommended at 6-12 months.  Just wanted to make sure you were aware of above.

## 2012-06-01 NOTE — Pre-Procedure Instructions (Signed)
Teach Back Method of teaching used for preop appointment.

## 2012-06-01 NOTE — Pre-Procedure Instructions (Signed)
06/01/12 Patient reports history of yellow dye allergy.  Called pharmacy and asked if yellow dyes in betasept.  Pharmacy stated not listed in ingredients but Pharmacy stated there " probably was ".  Patient instructed to use Dial Soap for preop shower nite before and am of surgery.  Patient stated she could use Dial Soap without a problem.

## 2012-06-01 NOTE — Patient Instructions (Signed)
22 [name] [name]  06/01/2012   Your procedure is scheduled on:  06/08/12 0730am-1100am  Report to [facility] at 0530 AM.  Call this number if you have problems the morning of surgery: [phone]   Remember:   Do not eat food:After Midnight.  May have clear liquids:until Midnight . [name]  Take these medicines the morning of surgery with A SIP OF WATER:   Do not wear jewelry, make-up or nail polish.  Do not wear lotions, powders, or perfumes.   Do not shave 48 hours prior to surgery  Do not bring valuables to the hospital.  Contacts, dentures or bridgework may not be worn into surgery.  Leave suitcase in the car. After surgery it may be brought to your room.  For patients admitted to the hospital, checkout time is 11:00 AM the day of discharge.       Special Instructions: CHG Shower Use Special Wash: 1/2 bottle night before surgery and 1/2 bottle morning of surgery. shower chin to toes with CHG.  Wash face and private parts with regular soap.    Please read over the following fact sheets that you were given: MRSA Information, coughing and deep breathing exercises, leg exercises

## 2012-06-01 NOTE — Pre-Procedure Instructions (Signed)
06/01/12 Requested last office visit note from Dr Earl Gala- PCP.  They are to fax.  Requested due to Ct abdomen and pelvis done 02/28/12 to see if any followup done or notes regarding nodule in lung.   Also, EKG shows"  consider anterior infarct"  done on 03/28/12 at time of ER Visit for Altered Mental Status.  Pt states she has not had any followup since ER visit of 03/28/12.Pt denies any history of cardiac problems.  EKG from 2005 shows normal sinus rhythm.  Both EKGS are located on chart .   CT of Head done.   03/28/12 located on chart from ER visit.  Patient denies any problems at time of preop visit done 06/01/12.

## 2012-06-05 ENCOUNTER — Telehealth (INDEPENDENT_AMBULATORY_CARE_PROVIDER_SITE_OTHER): Payer: Self-pay | Admitting: General Surgery

## 2012-06-05 NOTE — Telephone Encounter (Signed)
If she has an abscess, surgery must be postponed.  No placement of mesh with an active infection occurring!

## 2012-06-05 NOTE — Telephone Encounter (Signed)
Pt called her dentist because she has a toothache.  DDS called in Pen VK to her take.  She is scheduled for surgery on Thursday.  Concerned she surgery will be cancelled.  She was not seen at the dentist today, only called them.  Please advise so I can call her back.

## 2012-06-05 NOTE — Progress Notes (Signed)
Patient called and reported that she had been seen by dentist for a toothache on 06/05/12.  Patient reported that she has been placed on Penicillin 500mg  every 6 hours by mouth for a total of 30 pills.  Patient instructed to call and to inform Dr Michaell Cowing at [phone].

## 2012-06-06 ENCOUNTER — Telehealth (INDEPENDENT_AMBULATORY_CARE_PROVIDER_SITE_OTHER): Payer: Self-pay

## 2012-06-06 NOTE — Telephone Encounter (Signed)
Called pt to notify her that the hospital notified Dr Michaell Cowing about her having a toothache and had to be started on Penicillin. The pt is scheduled for surgery on 06/08/12 to have hernia repair but Dr Michaell Cowing wants to delay her surgery if she is having an infection. I notified pt of Dr Gordy Savers recommendations about r/s and the pt is not happy with r/s her surgery. The pt put her husband on the phone which he started to tell me how we needed to think about this before we just r/s her sx b/c he is worried that something wrong is going to happen with the hernia if not fixed this week. I explained to the husband like I did the pt if she has an infection anywhere in her body at the time we do the hernia repair you are putting yourself at a high risk of having your body reject the mesh. I told the pt that Dr Michaell Cowing is the one recommending this but I will page him again and go over this with him. I will call the pt back.

## 2012-06-06 NOTE — Telephone Encounter (Signed)
Called pt after I spoke to Dr Michaell Cowing again about the toothache. Per Dr Michaell Cowing if the pt can go see her dentist today to prove she does not have an active infection in her mouth and can stop the Penicilling then the pt will be able to keep the surgery as planned. If the dentist see's an infection then we will have to r/s surgery. The husband will call the pt's dentist today to see about an appt. The husband will call me back to let me know what is going on.

## 2012-06-06 NOTE — Telephone Encounter (Signed)
Returned pt's call. The pt did go and speak with her dentist today Dr Idelle Leech who said she would have no mouth infection by her surgery date. Dr Idelle Leech requested to speak with Dr Michaell Cowing so I am paging Dr Michaell Cowing to call Dr Idelle Leech [phone].

## 2012-06-08 ENCOUNTER — Encounter (HOSPITAL_COMMUNITY): Payer: Self-pay | Admitting: Anesthesiology

## 2012-06-08 ENCOUNTER — Ambulatory Visit (HOSPITAL_COMMUNITY)
Admission: RE | Admit: 2012-06-08 | Discharge: 2012-06-12 | Disposition: A | Discharge status: Home / Self Care | Payer: Medicare Other | Source: Ambulatory Visit | Attending: Surgery | Admitting: Surgery

## 2012-06-08 ENCOUNTER — Encounter (HOSPITAL_COMMUNITY): Payer: Self-pay | Admitting: *Deleted

## 2012-06-08 ENCOUNTER — Ambulatory Visit (HOSPITAL_COMMUNITY): Payer: Medicare Other | Admitting: Anesthesiology

## 2012-06-08 ENCOUNTER — Encounter (HOSPITAL_COMMUNITY)
Admission: RE | Disposition: A | Discharge status: Home / Self Care | Payer: Self-pay | Source: Ambulatory Visit | Attending: Surgery

## 2012-06-08 DIAGNOSIS — M549 Dorsalgia, unspecified: Secondary | ICD-10-CM | POA: Diagnosis present

## 2012-06-08 DIAGNOSIS — K432 Incisional hernia without obstruction or gangrene: Secondary | ICD-10-CM | POA: Insufficient documentation

## 2012-06-08 DIAGNOSIS — K660 Peritoneal adhesions (postprocedural) (postinfection): Secondary | ICD-10-CM | POA: Diagnosis not present

## 2012-06-08 DIAGNOSIS — R109 Unspecified abdominal pain: Secondary | ICD-10-CM | POA: Insufficient documentation

## 2012-06-08 DIAGNOSIS — E039 Hypothyroidism, unspecified: Secondary | ICD-10-CM | POA: Insufficient documentation

## 2012-06-08 DIAGNOSIS — K458 Other specified abdominal hernia without obstruction or gangrene: Secondary | ICD-10-CM | POA: Diagnosis not present

## 2012-06-08 DIAGNOSIS — K4090 Unilateral inguinal hernia, without obstruction or gangrene, not specified as recurrent: Secondary | ICD-10-CM | POA: Insufficient documentation

## 2012-06-08 DIAGNOSIS — M1990 Unspecified osteoarthritis, unspecified site: Secondary | ICD-10-CM | POA: Diagnosis present

## 2012-06-08 DIAGNOSIS — I10 Essential (primary) hypertension: Secondary | ICD-10-CM | POA: Insufficient documentation

## 2012-06-08 DIAGNOSIS — F411 Generalized anxiety disorder: Secondary | ICD-10-CM | POA: Diagnosis present

## 2012-06-08 DIAGNOSIS — M129 Arthropathy, unspecified: Secondary | ICD-10-CM | POA: Insufficient documentation

## 2012-06-08 DIAGNOSIS — E785 Hyperlipidemia, unspecified: Secondary | ICD-10-CM | POA: Insufficient documentation

## 2012-06-08 DIAGNOSIS — K589 Irritable bowel syndrome without diarrhea: Secondary | ICD-10-CM | POA: Insufficient documentation

## 2012-06-08 DIAGNOSIS — Z79899 Other long term (current) drug therapy: Secondary | ICD-10-CM | POA: Insufficient documentation

## 2012-06-08 DIAGNOSIS — K219 Gastro-esophageal reflux disease without esophagitis: Secondary | ICD-10-CM | POA: Insufficient documentation

## 2012-06-08 HISTORY — PX: VENTRAL HERNIA REPAIR: SHX424

## 2012-06-08 LAB — CBC
MCH: 29.4 pg (ref 26.0–34.0)
MCV: 85.2 fL (ref 78.0–100.0)
Platelets: 220 10*3/uL (ref 150–400)
RDW: 13 % (ref 11.5–15.5)

## 2012-06-08 SURGERY — REPAIR, HERNIA, VENTRAL, LAPAROSCOPIC
Anesthesia: General | Site: Flank | Laterality: Right | Wound class: Clean

## 2012-06-08 MED ORDER — PROMETHAZINE HCL 25 MG/ML IJ SOLN: 12.5000 mg | Freq: Four times a day (QID) | INTRAMUSCULAR | Status: DC | PRN

## 2012-06-08 MED ORDER — METOCLOPRAMIDE HCL 5 MG/ML IJ SOLN
INTRAMUSCULAR | Status: DC | PRN
  Administered 2012-06-08: 10 mg via INTRAVENOUS

## 2012-06-08 MED ORDER — BUPIVACAINE 0.25 % ON-Q PUMP DUAL CATH 300 ML
300.0000 mL | INJECTION | Status: DC
  Filled 2012-06-08: qty 300

## 2012-06-08 MED ORDER — VENLAFAXINE HCL ER 75 MG PO CP24
75.0000 mg | ORAL_CAPSULE | Freq: Two times a day (BID) | ORAL | Status: DC
  Filled 2012-06-08 (×2): qty 1

## 2012-06-08 MED ORDER — BUPIVACAINE-EPINEPHRINE 0.25% -1:200000 IJ SOLN
INTRAMUSCULAR | Status: DC | PRN
  Administered 2012-06-08: 60 mL

## 2012-06-08 MED ORDER — HYDROMORPHONE HCL PF 1 MG/ML IJ SOLN
INTRAMUSCULAR | Status: AC
  Filled 2012-06-08: qty 1

## 2012-06-08 MED ORDER — DIPHENHYDRAMINE HCL 50 MG/ML IJ SOLN: 12.5000 mg | Freq: Four times a day (QID) | INTRAMUSCULAR | Status: DC | PRN

## 2012-06-08 MED ORDER — ATENOLOL 50 MG PO TABS
50.0000 mg | ORAL_TABLET | Freq: Every day | ORAL | Status: DC
  Administered 2012-06-09 – 2012-06-12 (×4): 50 mg via ORAL
  Filled 2012-06-08 (×6): qty 1

## 2012-06-08 MED ORDER — BUPIVACAINE-EPINEPHRINE 0.25% -1:200000 IJ SOLN
INTRAMUSCULAR | Status: AC
  Filled 2012-06-08: qty 1

## 2012-06-08 MED ORDER — BUPIVACAINE 0.25 % ON-Q PUMP DUAL CATH 300 ML
300.0000 mL | INJECTION | Status: DC
  Administered 2012-06-08: 300 mL
  Filled 2012-06-08: qty 300

## 2012-06-08 MED ORDER — HYDROMORPHONE HCL PF 1 MG/ML IJ SOLN
0.2500 mg | INTRAMUSCULAR | Status: DC | PRN
  Administered 2012-06-08 (×3): 0.5 mg via INTRAVENOUS

## 2012-06-08 MED ORDER — PANTOPRAZOLE SODIUM 40 MG PO TBEC
80.0000 mg | DELAYED_RELEASE_TABLET | Freq: Every day | ORAL | Status: DC
  Administered 2012-06-09: 40 mg via ORAL
  Administered 2012-06-10 – 2012-06-11 (×3): 80 mg via ORAL
  Filled 2012-06-08 (×5): qty 2

## 2012-06-08 MED ORDER — ONDANSETRON HCL 4 MG/2ML IJ SOLN
INTRAMUSCULAR | Status: DC | PRN
  Administered 2012-06-08: 4 mg via INTRAVENOUS

## 2012-06-08 MED ORDER — NEOSTIGMINE METHYLSULFATE 1 MG/ML IJ SOLN
INTRAMUSCULAR | Status: DC | PRN
  Administered 2012-06-08: 3 mg via INTRAVENOUS

## 2012-06-08 MED ORDER — MIDAZOLAM HCL 5 MG/5ML IJ SOLN
INTRAMUSCULAR | Status: DC | PRN
  Administered 2012-06-08 (×2): 1 mg via INTRAVENOUS

## 2012-06-08 MED ORDER — HYDROMORPHONE HCL PF 1 MG/ML IJ SOLN
0.5000 mg | INTRAMUSCULAR | Status: DC | PRN
  Administered 2012-06-08 – 2012-06-10 (×9): 1 mg via INTRAVENOUS
  Filled 2012-06-08 (×8): qty 1

## 2012-06-08 MED ORDER — STERILE WATER FOR IRRIGATION IR SOLN
Status: DC | PRN
  Administered 2012-06-08: 1500 mL

## 2012-06-08 MED ORDER — HYDROMORPHONE HCL PF 1 MG/ML IJ SOLN
INTRAMUSCULAR | Status: AC
  Administered 2012-06-08: 1 mg via INTRAVENOUS
  Filled 2012-06-08: qty 1

## 2012-06-08 MED ORDER — LIDOCAINE HCL (CARDIAC) 20 MG/ML IV SOLN
INTRAVENOUS | Status: DC | PRN
  Administered 2012-06-08: 50 mg via INTRAVENOUS

## 2012-06-08 MED ORDER — ALPRAZOLAM 1 MG PO TABS
1.0000 mg | ORAL_TABLET | Freq: Four times a day (QID) | ORAL | Status: DC
  Administered 2012-06-08 – 2012-06-12 (×15): 1 mg via ORAL
  Filled 2012-06-08 (×14): qty 1

## 2012-06-08 MED ORDER — LACTATED RINGERS IV BOLUS (SEPSIS): 1000.0000 mL | Freq: Three times a day (TID) | INTRAVENOUS | Status: AC | PRN

## 2012-06-08 MED ORDER — LACTATED RINGERS IV SOLN
INTRAVENOUS | Status: DC | PRN
  Administered 2012-06-08 (×3): via INTRAVENOUS

## 2012-06-08 MED ORDER — FENOFIBRATE 160 MG PO TABS
160.0000 mg | ORAL_TABLET | Freq: Every day | ORAL | Status: DC
  Administered 2012-06-08 – 2012-06-12 (×5): 160 mg via ORAL
  Filled 2012-06-08 (×5): qty 1

## 2012-06-08 MED ORDER — PROPOFOL 10 MG/ML IV BOLUS
INTRAVENOUS | Status: DC | PRN
  Administered 2012-06-08: 165 mg via INTRAVENOUS

## 2012-06-08 MED ORDER — ACETAMINOPHEN 10 MG/ML IV SOLN
INTRAVENOUS | Status: DC | PRN
  Administered 2012-06-08: 1000 mg via INTRAVENOUS

## 2012-06-08 MED ORDER — HYOSCYAMINE SULFATE ER 0.375 MG PO TB12
0.3750 mg | ORAL_TABLET | Freq: Two times a day (BID) | ORAL | Status: DC | PRN
  Filled 2012-06-08: qty 1

## 2012-06-08 MED ORDER — BIOTENE DRY MOUTH MT LIQD
15.0000 mL | Freq: Two times a day (BID) | OROMUCOSAL | Status: DC
  Administered 2012-06-08 – 2012-06-12 (×7): 15 mL via OROMUCOSAL

## 2012-06-08 MED ORDER — CHLORHEXIDINE GLUCONATE 4 % EX LIQD
1.0000 "application " | Freq: Once | CUTANEOUS | Status: DC
  Filled 2012-06-08: qty 15

## 2012-06-08 MED ORDER — CYCLOBENZAPRINE HCL 10 MG PO TABS
10.0000 mg | ORAL_TABLET | Freq: Three times a day (TID) | ORAL | Status: DC | PRN
  Filled 2012-06-08: qty 1

## 2012-06-08 MED ORDER — BISACODYL 10 MG RE SUPP: 10.0000 mg | Freq: Two times a day (BID) | RECTAL | Status: DC | PRN

## 2012-06-08 MED ORDER — BUSPIRONE HCL 15 MG PO TABS
15.0000 mg | ORAL_TABLET | Freq: Every day | ORAL | Status: DC
  Administered 2012-06-09 – 2012-06-12 (×4): 15 mg via ORAL
  Filled 2012-06-08 (×6): qty 1

## 2012-06-08 MED ORDER — ACETAMINOPHEN 325 MG PO TABS
650.0000 mg | ORAL_TABLET | Freq: Four times a day (QID) | ORAL | Status: DC
  Administered 2012-06-08 – 2012-06-12 (×16): 650 mg via ORAL
  Filled 2012-06-08 (×20): qty 2

## 2012-06-08 MED ORDER — HEPARIN SODIUM (PORCINE) 5000 UNIT/ML IJ SOLN
5000.0000 [IU] | Freq: Three times a day (TID) | INTRAMUSCULAR | Status: DC
  Administered 2012-06-09 – 2012-06-12 (×10): 5000 [IU] via SUBCUTANEOUS
  Filled 2012-06-08 (×14): qty 1

## 2012-06-08 MED ORDER — ZOLPIDEM TARTRATE 10 MG PO TABS: 20.0000 mg | ORAL_TABLET | Freq: Every day | ORAL | Status: DC

## 2012-06-08 MED ORDER — POTASSIUM CHLORIDE CRYS ER 20 MEQ PO TBCR
20.0000 meq | EXTENDED_RELEASE_TABLET | Freq: Two times a day (BID) | ORAL | Status: DC
  Administered 2012-06-08 – 2012-06-12 (×9): 20 meq via ORAL
  Filled 2012-06-08 (×10): qty 1

## 2012-06-08 MED ORDER — ACETAMINOPHEN 10 MG/ML IV SOLN
INTRAVENOUS | Status: AC
  Filled 2012-06-08: qty 100

## 2012-06-08 MED ORDER — LACTATED RINGERS IV SOLN
INTRAVENOUS | Status: DC
  Administered 2012-06-08: 19:00:00 via INTRAVENOUS

## 2012-06-08 MED ORDER — CISATRACURIUM BESYLATE (PF) 10 MG/5ML IV SOLN
INTRAVENOUS | Status: DC | PRN
  Administered 2012-06-08: 2 mg via INTRAVENOUS
  Administered 2012-06-08: 8 mg via INTRAVENOUS
  Administered 2012-06-08: 3 mg via INTRAVENOUS
  Administered 2012-06-08: 2 mg via INTRAVENOUS

## 2012-06-08 MED ORDER — MAGIC MOUTHWASH
15.0000 mL | Freq: Four times a day (QID) | ORAL | Status: DC | PRN
  Filled 2012-06-08: qty 15

## 2012-06-08 MED ORDER — LIDOCAINE 5 % EX PTCH
1.0000 | MEDICATED_PATCH | CUTANEOUS | Status: DC
  Administered 2012-06-09 – 2012-06-11 (×2): 1 via TRANSDERMAL
  Filled 2012-06-08 (×5): qty 3

## 2012-06-08 MED ORDER — HYDROCHLOROTHIAZIDE 25 MG PO TABS
25.0000 mg | ORAL_TABLET | Freq: Every day | ORAL | Status: DC
  Administered 2012-06-09 – 2012-06-12 (×4): 25 mg via ORAL
  Filled 2012-06-08 (×6): qty 1

## 2012-06-08 MED ORDER — LIP MEDEX EX OINT
1.0000 "application " | TOPICAL_OINTMENT | Freq: Two times a day (BID) | CUTANEOUS | Status: DC
  Administered 2012-06-08 – 2012-06-12 (×8): 1 via TOPICAL
  Filled 2012-06-08: qty 7

## 2012-06-08 MED ORDER — CEFAZOLIN SODIUM-DEXTROSE 2-3 GM-% IV SOLR
2.0000 g | INTRAVENOUS | Status: AC
  Administered 2012-06-08: 2 g via INTRAVENOUS

## 2012-06-08 MED ORDER — OXYCODONE HCL 15 MG PO TABS: 15.0000 mg | ORAL_TABLET | Freq: Four times a day (QID) | ORAL | Status: DC | PRN

## 2012-06-08 MED ORDER — LEVOTHYROXINE SODIUM 112 MCG PO TABS
112.0000 ug | ORAL_TABLET | Freq: Every day | ORAL | Status: DC
  Administered 2012-06-09 – 2012-06-12 (×4): 112 ug via ORAL
  Filled 2012-06-08 (×6): qty 1

## 2012-06-08 MED ORDER — EPHEDRINE SULFATE 50 MG/ML IJ SOLN
INTRAMUSCULAR | Status: DC | PRN
  Administered 2012-06-08: 10 mg via INTRAVENOUS
  Administered 2012-06-08: 5 mg via INTRAVENOUS
  Administered 2012-06-08: 10 mg via INTRAVENOUS

## 2012-06-08 MED ORDER — PHENOL 1.4 % MT LIQD
2.0000 | OROMUCOSAL | Status: DC | PRN
  Filled 2012-06-08: qty 177

## 2012-06-08 MED ORDER — OXYCODONE HCL 5 MG PO TABS
15.0000 mg | ORAL_TABLET | Freq: Four times a day (QID) | ORAL | Status: DC | PRN
  Administered 2012-06-09 (×2): 15 mg via ORAL
  Administered 2012-06-10: 30 mg via ORAL
  Administered 2012-06-10: 15 mg via ORAL
  Administered 2012-06-10 – 2012-06-12 (×7): 30 mg via ORAL
  Filled 2012-06-08: qty 2
  Filled 2012-06-08: qty 3
  Filled 2012-06-08: qty 6
  Filled 2012-06-08: qty 2
  Filled 2012-06-08 (×3): qty 6
  Filled 2012-06-08: qty 3
  Filled 2012-06-08: qty 4
  Filled 2012-06-08: qty 3
  Filled 2012-06-08 (×4): qty 6

## 2012-06-08 MED ORDER — GLYCOPYRROLATE 0.2 MG/ML IJ SOLN
INTRAMUSCULAR | Status: DC | PRN
  Administered 2012-06-08: .5 mg via INTRAVENOUS
  Administered 2012-06-08: 0.2 mg via INTRAVENOUS

## 2012-06-08 MED ORDER — FUROSEMIDE 20 MG PO TABS
20.0000 mg | ORAL_TABLET | Freq: Every day | ORAL | Status: DC
  Administered 2012-06-09 – 2012-06-12 (×4): 20 mg via ORAL
  Filled 2012-06-08 (×6): qty 1

## 2012-06-08 MED ORDER — ONDANSETRON HCL 4 MG PO TABS: 4.0000 mg | ORAL_TABLET | Freq: Four times a day (QID) | ORAL | Status: DC | PRN

## 2012-06-08 MED ORDER — ZOLPIDEM TARTRATE 5 MG PO TABS
5.0000 mg | ORAL_TABLET | Freq: Every day | ORAL | Status: DC
  Administered 2012-06-08 – 2012-06-11 (×4): 5 mg via ORAL
  Filled 2012-06-08 (×4): qty 1

## 2012-06-08 MED ORDER — VENLAFAXINE HCL ER 75 MG PO CP24
75.0000 mg | ORAL_CAPSULE | Freq: Two times a day (BID) | ORAL | Status: DC
  Administered 2012-06-08 – 2012-06-12 (×8): 75 mg via ORAL
  Filled 2012-06-08 (×10): qty 1

## 2012-06-08 MED ORDER — CEFAZOLIN SODIUM 1-5 GM-% IV SOLN
INTRAVENOUS | Status: AC
  Filled 2012-06-08: qty 100

## 2012-06-08 MED ORDER — MAGNESIUM HYDROXIDE 400 MG/5ML PO SUSP: 30.0000 mL | Freq: Two times a day (BID) | ORAL | Status: DC | PRN

## 2012-06-08 MED ORDER — BUPIVACAINE-EPINEPHRINE PF 0.25-1:200000 % IJ SOLN
INTRAMUSCULAR | Status: AC
  Filled 2012-06-08: qty 30

## 2012-06-08 MED ORDER — PROMETHAZINE HCL 25 MG/ML IJ SOLN: 6.2500 mg | INTRAMUSCULAR | Status: DC | PRN

## 2012-06-08 MED ORDER — PSYLLIUM 95 % PO PACK
1.0000 | PACK | Freq: Two times a day (BID) | ORAL | Status: DC
  Administered 2012-06-08 – 2012-06-12 (×9): 1 via ORAL
  Filled 2012-06-08 (×10): qty 1

## 2012-06-08 MED ORDER — 0.9 % SODIUM CHLORIDE (POUR BTL) OPTIME
TOPICAL | Status: DC | PRN
  Administered 2012-06-08: 2000 mL

## 2012-06-08 MED ORDER — ONDANSETRON HCL 4 MG/2ML IJ SOLN: 4.0000 mg | Freq: Four times a day (QID) | INTRAMUSCULAR | Status: DC | PRN

## 2012-06-08 MED ORDER — LORAZEPAM BOLUS VIA INFUSION
0.5000 mg | Freq: Three times a day (TID) | INTRAVENOUS | Status: DC | PRN
  Filled 2012-06-08: qty 1

## 2012-06-08 MED ORDER — LACTATED RINGERS IR SOLN
Status: DC | PRN
  Administered 2012-06-08: 1000 mL

## 2012-06-08 MED ORDER — FENTANYL CITRATE 0.05 MG/ML IJ SOLN
INTRAMUSCULAR | Status: DC | PRN
  Administered 2012-06-08 (×7): 50 ug via INTRAVENOUS

## 2012-06-08 SURGICAL SUPPLY — 48 items
APPLIER CLIP 5 13 M/L LIGAMAX5 (MISCELLANEOUS)
APR CLP MED LRG 5 ANG JAW (MISCELLANEOUS)
BINDER ABD UNIV 12 45-62 (WOUND CARE) IMPLANT
BINDER ABDOMINAL 46IN 62IN (WOUND CARE)
CANISTER SUCTION 2500CC (MISCELLANEOUS) ×3 IMPLANT
CATH KIT ON Q 7.5IN SLV (PAIN MANAGEMENT) ×2 IMPLANT
CLIP APPLIE 5 13 M/L LIGAMAX5 (MISCELLANEOUS) IMPLANT
CLOTH BEACON ORANGE TIMEOUT ST (SAFETY) ×2 IMPLANT
CLSR STERI-STRIP ANTIMIC 1/2X4 (GAUZE/BANDAGES/DRESSINGS) ×4 IMPLANT
DECANTER SPIKE VIAL GLASS SM (MISCELLANEOUS) ×2 IMPLANT
DEVICE SECURE STRAP 25 ABSORB (INSTRUMENTS) ×1 IMPLANT
DEVICE TROCAR PUNCTURE CLOSURE (ENDOMECHANICALS) ×1 IMPLANT
DRAPE LAPAROSCOPIC ABDOMINAL (DRAPES) ×2 IMPLANT
DRSG TEGADERM 2-3/8X2-3/4 SM (GAUZE/BANDAGES/DRESSINGS) ×6 IMPLANT
ELECT REM PT RETURN 9FT ADLT (ELECTROSURGICAL) ×2
ELECTRODE REM PT RTRN 9FT ADLT (ELECTROSURGICAL) ×1 IMPLANT
FILTER SMOKE EVAC LAPAROSHD (FILTER) IMPLANT
GAUZE SPONGE 2X2 8PLY STRL LF (GAUZE/BANDAGES/DRESSINGS) IMPLANT
GLOVE ECLIPSE 8.0 STRL XLNG CF (GLOVE) ×2 IMPLANT
GLOVE INDICATOR 8.0 STRL GRN (GLOVE) ×4 IMPLANT
GOWN STRL NON-REIN LRG LVL3 (GOWN DISPOSABLE) ×2 IMPLANT
GOWN STRL REIN XL XLG (GOWN DISPOSABLE) ×4 IMPLANT
HAND ACTIVATED (MISCELLANEOUS) IMPLANT
KIT BASIN OR (CUSTOM PROCEDURE TRAY) ×2 IMPLANT
MESH PHYSIO OVAL 25X35CM (Mesh General) ×1 IMPLANT
NDL SPNL 22GX3.5 QUINCKE BK (NEEDLE) IMPLANT
NEEDLE SPNL 22GX3.5 QUINCKE BK (NEEDLE) IMPLANT
NS IRRIG 1000ML POUR BTL (IV SOLUTION) ×2 IMPLANT
PEN SKIN MARKING BROAD (MISCELLANEOUS) ×2 IMPLANT
PENCIL BUTTON HOLSTER BLD 10FT (ELECTRODE) IMPLANT
SCISSORS LAP 5X35 DISP (ENDOMECHANICALS) ×2 IMPLANT
SET IRRIG TUBING LAPAROSCOPIC (IRRIGATION / IRRIGATOR) IMPLANT
SLEEVE Z-THREAD 5X100MM (TROCAR) ×4 IMPLANT
SPONGE GAUZE 2X2 STER 10/PKG (GAUZE/BANDAGES/DRESSINGS) ×5
STAPLER VISISTAT (STAPLE) ×1 IMPLANT
STRIP CLOSURE SKIN 1/2X4 (GAUZE/BANDAGES/DRESSINGS) ×2 IMPLANT
SUT MNCRL AB 4-0 PS2 18 (SUTURE) ×2 IMPLANT
SUT PROLENE 1 CT 1 30 (SUTURE) ×6 IMPLANT
SUT VIC AB 2-0 UR6 27 (SUTURE) IMPLANT
TACKER 5MM HERNIA 3.5CML NAB (ENDOMECHANICALS) ×1 IMPLANT
TOWEL OR 17X26 10 PK STRL BLUE (TOWEL DISPOSABLE) ×2 IMPLANT
TRAY FOLEY CATH 14FRSI W/METER (CATHETERS) IMPLANT
TRAY LAP CHOLE (CUSTOM PROCEDURE TRAY) ×2 IMPLANT
TROCAR XCEL BLADELESS 5X75MML (TROCAR) ×2 IMPLANT
TROCAR Z-THREAD FIOS 11X100 BL (TROCAR) ×2 IMPLANT
TROCAR Z-THREAD FIOS 5X100MM (TROCAR) ×2 IMPLANT
TUBING INSUFFLATION 10FT LAP (TUBING) ×2 IMPLANT
TUNNELER SHEATH ON-Q 16GX12 DP (PAIN MANAGEMENT) ×1 IMPLANT

## 2012-06-08 NOTE — Anesthesia Postprocedure Evaluation (Signed)
  Anesthesia Post-op Note  Patient: [name] [name]  Procedure(s) Performed: Procedure(s) (LRB): LAPAROSCOPIC VENTRAL HERNIA (Right) INSERTION OF MESH (Right)  Patient Location: PACU  Anesthesia Type: General  Level of Consciousness: awake and alert   Airway and Oxygen Therapy: Patient Spontanous Breathing  Post-op Pain: mild  Post-op Assessment: Post-op Vital signs reviewed, Patient's Cardiovascular Status Stable, Respiratory Function Stable, Patent Airway and No signs of Nausea or vomiting  Post-op Vital Signs: stable  Complications: No apparent anesthesia complications

## 2012-06-08 NOTE — H&P (Signed)
[name] [name]  Apr 24, 1936 [id number]  CARE TEAM:  PCP: Darnelle Bos, MD  Outpatient Care Team: Patient Care Team: Darnelle Bos, MD as PCP - General (Internal Medicine)  Inpatient Treatment Team: Treatment Team: Attending Provider: Ardeth Sportsman, MD   This patient is a 76 y.o.female who presents today for surgical evaluation.   Reason for visit: Right posterior flank hernia.  The patient is a pleasant elderly woman with numerous back issues including scoliosis. She was referred by Dr. Yetta Barre to [facility]. She underwent complex back surgery with bilateral releases. She recovered from that. That was back in 2010. However, she noticed an abnormal lump on her right side. It has gotten bigger. It is uncomfortable for her. No nausea or vomiting. No fevers or chills. Pain can radiate around the side.  She noticed her primary care physician. CAT scan revealed evidence of small bowel exiting the right posterior flank consistent with a flank hernia. She was sent to [place] for surgical evaluation Recent toothahce & cavities.  Initially a concern of abscess but was minor & has underwent root canals earlier this week.  No new events   Patient Active Problem List  Diagnosis  . Hernia of Right posterior flank    Past Medical History  Diagnosis Date  . Hypertension   . Hyperlipidemia   . Thyroid disease   . GERD (gastroesophageal reflux disease)   . Anxiety   . IBS (irritable bowel syndrome)   . Back pain   . Blood transfusion     at age of 4  . Hypothyroidism   . H/O hiatal hernia   . Arthritis     generalized   . Anemia     hx of years ago     Past Surgical History  Procedure Date  . Back surgery   . Cholecystectomy   . Tonsillectomy   . Abdominal hysterectomy     partial  . Joint replacement     left shoulder replacement     History   Social History  . Marital Status: Married    Spouse Name: N/A    Number of Children: N/A  . Years of  Education: N/A   Occupational History  . Not on file.   Social History Main Topics  . Smoking status: Never Smoker   . Smokeless tobacco: Never Used  . Alcohol Use: No  . Drug Use: No  . Sexually Active:    Other Topics Concern  . Not on file   Social History Narrative  . No narrative on file    Family History  Problem Relation Age of Onset  . Heart disease Mother   . Cancer Father     lung    Current Facility-Administered Medications  Medication Dose Route Frequency Provider Last Rate Last Dose  . ceFAZolin (ANCEF) IVPB 2 g/50 mL premix  2 g Intravenous 60 min Pre-Op Ardeth Sportsman, MD      . chlorhexidine (HIBICLENS) 4 % liquid 1 application  1 application Topical Once Ardeth Sportsman, MD      . chlorhexidine (HIBICLENS) 4 % liquid 1 application  1 application Topical Once Ardeth Sportsman, MD       Facility-Administered Medications Ordered in Other Encounters  Medication Dose Route Frequency Provider Last Rate Last Dose  . lactated ringers infusion    Continuous PRN Garth Bigness, CRNA         Allergies  Allergen Reactions  . Yellow Dyes (Non-Tartrazine) Itching  Makes patient nervous     Review of Systems  Constitutional: Negative for fever, chills, diaphoresis, appetite change and fatigue.  HENT: Positive for hearing loss. Negative for ear pain, sore throat, trouble swallowing, neck pain and ear discharge.  Eyes: Negative for photophobia, discharge and visual disturbance.  Respiratory: Negative for cough, choking, chest tightness and shortness of breath.  Cardiovascular: Negative for chest pain and palpitations.  Patient walks 60 minutes for about 2 miles without difficulty on treadmill. No exertional chest/neck/shoulder/arm pain.  Gastrointestinal: Negative for nausea, vomiting, abdominal pain, diarrhea, constipation, anal bleeding and rectal pain.  No personal nor family history of GI/colon cancer, inflammatory bowel disease, allergy such as Celiac  Sprue, dietary/dairy problems, colitis, ulcers nor gastritis.  Possible IBS but no severe Constipation/Diarrhea  No recent sick contacts/gastroenteritis. No travel outside the country. No changes in diet.  Genitourinary: Negative for dysuria, frequency and difficulty urinating.  Musculoskeletal: Positive for myalgias and back pain. Negative for gait problem.  Skin: Negative for color change, pallor and rash.  Neurological: Negative for dizziness, speech difficulty, weakness and numbness.  Hematological: Negative for adenopathy. Bruises/bleeds easily.  Psychiatric/Behavioral: Negative for confusion and agitation. The patient is not nervous/anxious.    Objective:   Physical Exam  Constitutional: She is oriented to person, place, and time. She appears well-developed and well-nourished. No distress.  HENT:  Head: Normocephalic.  Mouth/Throat: Oropharynx is clear and moist. No oropharyngeal exudate.  Eyes: Conjunctivae and EOM are normal. Pupils are equal, round, and reactive to light. No scleral icterus.  Neck: Normal range of motion. Neck supple. No tracheal deviation present.  Cardiovascular: Normal rate, regular rhythm and intact distal pulses.  Pulmonary/Chest: Effort normal and breath sounds normal. No respiratory distress. She exhibits no tenderness.  Abdominal: Soft. She exhibits no distension and no mass. There is no tenderness. Hernia confirmed negative in the right inguinal area and confirmed negative in the left inguinal area.  Genitourinary: No vaginal discharge found.  Musculoskeletal: Normal range of motion. She exhibits no tenderness.  R post VWH defect on flank Lymphadenopathy:  She has no cervical adenopathy.  Right: No inguinal adenopathy present.  Left: No inguinal adenopathy present.  Neurological: She is alert and oriented to person, place, and time. No cranial nerve deficit. She exhibits normal muscle tone. Coordination normal.  Skin: Skin is warm and dry. No rash  noted. She is not diaphoretic. No erythema.  Psychiatric: She has a normal mood and affect. Her behavior is normal. Judgment and thought content normal.       BP 118/73  Pulse 59  Temp 98.6 F (37 C)  Resp 20  SpO2 95%   Results:   Labs: No results found for this or any previous visit (from the past 48 hour(s)).  Imaging / Studies: No results found.  Medications / Allergies: per chart  Antibiotics: Anti-infectives     Start     Dose/Rate Route Frequency Ordered Stop   06/08/12 0556   ceFAZolin (ANCEF) IVPB 2 g/50 mL premix        2 g 100 mL/hr over 30 Minutes Intravenous 60 min pre-op 06/08/12 0556            Assessment  Lorenso Courier  77 y.o. female  Day of Surgery  Procedure(s): LAPAROSCOPIC VENTRAL HERNIA INSERTION OF MESH  Problem List:  Active Problems:  * No active hospital problems. *   Right posterior flank hernia s/p numerous back surgeries   Plan:    I think she  would benefit from repair especially since a loop of intestine is going up into this hernia. It is causing a lot of pain and discomfort in her. She and her husband are very interested in getting this repaired. It is reasonable start a laparoscopic approach with her in a 90 decubitus position. Probably split at the kidney level to help open up the area.  The anatomy & physiology of the abdominal wall was discussed. The pathophysiology of hernias was discussed. Natural history risks without surgery including progeressive enlargement, pain, incarceration & strangulation was discussed. Contributors to complications such as smoking, obesity, diabetes, prior surgery, etc were discussed.  I feel the risks of no intervention will lead to serious problems that outweigh the operative risks; therefore, I recommended surgery to reduce and repair the hernia. I explained laparoscopic techniques with possible need for an open approach. I noted the probable use of mesh to patch and/or buttress the hernia  repair  Risks such as bleeding, infection, abscess, need for further treatment, heart attack, death, and other risks were discussed. I noted a good likelihood this will help address the problem. Goals of post-operative recovery were discussed as well. Possibility that this will not correct all symptoms was explained. I stressed the importance of low-impact activity, aggressive pain control, avoiding constipation, & not pushing through pain to minimize risk of post-operative chronic pain or injury. Possibility of reherniation especially with smoking, obesity, diabetes, immunosuppression, and other health conditions was discussed. We will work to minimize complications.  An educational handout further explaining the pathology & treatment options was given as well. Questions were answered. The patient expresses understanding & wishes to proceed with surgery.   I have re-reviewed the the patient's records, history, medications, and allergies.  I have re-examined the patient.  I again discussed intraoperative plans and goals of post-operative recovery.  The patient agrees to proceed.    Ardeth Sportsman, M.D., F.A.C.S. Gastrointestinal and Minimally Invasive Surgery [facility] [address] [place], [place] [address] [phone] Main / Paging [phone] Voice Mail   06/08/2012

## 2012-06-08 NOTE — Progress Notes (Signed)
Dental extraction 06/07/2012.

## 2012-06-08 NOTE — Anesthesia Preprocedure Evaluation (Addendum)
Anesthesia Evaluation  Patient identified by MRN, date of birth, ID band Patient awake    Reviewed: Allergy & Precautions, H&P , NPO status , Patient's Chart, lab work & pertinent test results  Airway Mallampati: II TM Distance: >3 FB Neck ROM: Full    Dental No notable dental hx.    Pulmonary neg pulmonary ROS,  breath sounds clear to auscultation  Pulmonary exam normal       Cardiovascular hypertension, Pt. on home beta blockers Rhythm:Regular Rate:Normal     Neuro/Psych Anxiety  Neuromuscular disease    GI/Hepatic Neg liver ROS, hiatal hernia, GERD-  Medicated,  Endo/Other  Hypothyroidism   Renal/GU negative Renal ROS  negative genitourinary   Musculoskeletal negative musculoskeletal ROS (+)   Abdominal   Peds negative pediatric ROS (+)  Hematology negative hematology ROS (+)   Anesthesia Other Findings   Reproductive/Obstetrics negative OB ROS                           Anesthesia Physical Anesthesia Plan  ASA: II  Anesthesia Plan: General   Post-op Pain Management:    Induction: Intravenous  Airway Management Planned: Oral ETT  Additional Equipment:   Intra-op Plan:   Post-operative Plan: Extubation in OR  Informed Consent: I have reviewed the patients History and Physical, chart, labs and discussed the procedure including the risks, benefits and alternatives for the proposed anesthesia with the patient or authorized representative who has indicated his/her understanding and acceptance.   Dental advisory given  Plan Discussed with: CRNA  Anesthesia Plan Comments:        Anesthesia Quick Evaluation

## 2012-06-08 NOTE — Op Note (Signed)
[name] [name] NO.:  [id number]  MEDICAL RECORD NO.:  [id number]  LOCATION:  [facility]                         FACILITY:  [facility]  PHYSICIAN:  [name] Sportsman, MD     DATE OF BIRTH:  November 26, 1936  DATE OF PROCEDURE:  06/08/2012 DATE OF DISCHARGE:                              OPERATIVE REPORT   PRIMARY CARE PHYSICIAN:  [name] [name], M.D.  SURGEON:  [name] Sportsman, MD.  ASSIST:  R.N.  PREOPERATIVE DIAGNOSIS:  Right flank incisional hernia with colon within it.  POSTOPERATIVE DIAGNOSES: 1. Right flank incisional hernias x3 Swiss cheese pattern. 2. Right inguinal hernia.  PROCEDURE PERFORMED: 1. Laparoscopic lysis adhesions x45 minutes equals 1/3 of case. 2. Laparoscopic flank ventral hernia repair with mesh x3. 3. Laparoscopic right inguinal hernia repair with mesh.  ANESTHESIA: 1. General anesthesia. 2. Local anesthetic in a field block. 3. ON-Q continuous bupivacaine pain pump placed in the preperitoneal     plane.  SPECIMEN:  None.  DRAINS:  None.  ESTIMATED BLOOD LOSS:  30 mL.  COMPLICATIONS:  None.  MAJOR INDICATIONS:  Mr. [name] [name] is a pleasant 76 year old female, who has had numerous back issues including scoliosis.  She had surgery done at [facility], and had bilateral flank releases for spine reconstruction by Dr. Yetta [name] there.  Three years later, she has developed a lump on her right side.  She notes that it is gotten larger in size and uncomfortable.  CAT scan shows evidence of her ascending colon going into it.  Pathophysiology of herniation & risks of incarceration and strangulation were discussed.  Options were discussed. Recommendation was made for laparoscopic, possible open, repair of hernia with mesh.  Risks, benefits, and alternatives were discussed. Probable prolonged pain given her chronic pain already and given the location were discussed.  Questions answered.  She and her husband agreed to proceed.  She  did have an episode of toothache and pain and had a tooth infection last month.  She had recurrent episode.  She had getting root canals done, and there was no evidence of any abscess or infection, and therefore, we felt it was safe to proceed with surgery.  OPERATIVE FINDINGS:  She had a 15 x 10 cm region of Swiss-cheese hernias on her right posterior flank x3.  She also had a right inguinal hernia indirect in nature.  DESCRIPTION OF PROCEDURE:  Informed consent was confirmed.  Patient underwent general anesthesia without any difficulty.  She was placed in left decubitus with a sand bag with a gentle right arm air planed out and carefully padded.  She had sequential compression devices active during the entire case.  Her abdomen, chest, and back were prepped and draped in sterile fashion.  Surgical time-out confirmed our plan.  I placed a 5-mm port in the right upper quadrant using optical entry technique with the patient in steep reverse Trendelenburg and right side up. Entry was clean.  I induced carbon dioxide insufflation.  Camera inspection revealed some moderate adhesions of greater omentum in the upper abdomen.  I was able to move through them and placed a 5-mm port in the right medial abdomen and another  one in the right lower quadrant.  I did sharp dissection to free the omental adhesions off the anterior abdominal wall using scissors with minimized cautery.  I upsized the right upper quadrant port site to a 10-mm port site.  I did further dissection and mobilize the right colon and right kidney and right lobe of the liver in a lateral to medial fashion.  I went up more towards the right hepatic lobe in the area that was not operated on, cleaned, and then gradually marched inferiorly.  With that, I freed the peritoneum off the right flank and came around posteriorly.  With that, I could reduce the hernia sac and the ascending colon contents that were incarcerated in the  larger hernia.  Once this was reduced, it became apparent that she had 2 smaller but definite hernias just above that in sort of a Swiss cheese pattern consistent with all these transverse cuts that had happened in the midaxillary line from her prior back surgery.  I felt all these areas had to be repaired and mobilizing the peritoneum off the suprapubic region and noted that she had right inguinal hernia as well.  There was some cord lipomas within it and I was able to reduce that down, and freed that off the round ligament to help free that down.  I saw the right ureter and kept that posterior preserved.  I had good mobilization to the psoas muscle posteriorly and actually freed and mobilized the psoas muscle lateral to medial using careful blunt dissection as well.  Given the numerous defects, I chose a 35 x 25 cm dual sided mesh (Physiomesh = ultralight polypropylene/Monocryl mesh.  I laid it in the abdomen and onto the flank.  I secured it posteriorly using #1 Prolene stitches in and out the mesh around the level of the erector spinae posterolateral to the psoas muscle on that right side.  I could bring it around anteriorly and secured the medial edge in the right rectus paramedian region using #1 Prolene interrupted stitches x5.  That helped Lay the mesh coverage out.  I then proceeded to place some transabdominal fascial stitches in and out the mesh x4 5 cm medial to the hernias and then a few inferiorly along the iliac crest, where the largest and most inferior hernia was very close to.  I did have 1 cm of fascia just above the iliac crest between that and the hernia that I used to try and help hold the mesh to attach some descent tissue to hold to.  I then used a spiral Tacker to tack few areas on the iliac crest and then also on the pubic rim on the right side to help make sure that there was plenty of mesh covering over the right inguinal hernia as well.  I used a total of 16  transfascial abdominal stitches.  Then, I used an absorbable Tacker to re-tack the peritoneum off the right flank and abdomen and back up to part of the mesh to help cover and hold that place and help sandwich the posterior half of the mesh between vascularized layers.  The mesh laid well.  I used On-Q tunneler in the posterior axillary line down towards the posterior-superior iliac spine.  I used another catheter in the anterior axillary line and brought that around to the right paramedian region. The 10 mm port I closed using a 0 Vicryl stitch using laparoscopic intracorporeal suturing.  Hemostasis was excellent.  I evacuated carbon dioxide and removed  the ports.  I placed ON-Q catheter through the sheath and peeled away the sheaths, and then closed the skin sites using 4-0 Monocryl stitch.  Sterile dressings applied.  Puncture holes for the transabdominal fascial stitches were closed using Steri-Strips.  The patient is being extubated to go to recovery room.  We will watch her at least overnight.     [name] Sportsman, MD     SCG/MEDQ  D:  06/08/2012  T:  06/08/2012  Job:  [id number]  cc:   [name] [name], M.D. Fax: [phone]

## 2012-06-08 NOTE — Brief Op Note (Signed)
06/08/2012  11:17 AM  PATIENT:  [name] [name]  76 y.o. female  Patient Care Team: Darnelle Bos, MD as PCP - General (Internal Medicine)  PRE-OPERATIVE DIAGNOSIS:  right flank hernia   POST-OPERATIVE DIAGNOSIS:    Right flank incisional hernias x3 RIH  PROCEDURE:  Procedure(s) (LRB): LAPAROSCOPIC VENTRAL HERNIA (Right) x 3 Lap RIH repair w mesh INSERTION OF MESH (Right)  SURGEON:  Surgeon(s) and Role:    * Ardeth Sportsman, MD - Primary  PHYSICIAN ASSISTANT:   ASSISTANTS: none   ANESTHESIA:   local and general  EBL:  Total I/O In: 2300 [I.V.:2300] Out: 550 [Urine:475; Blood:75]  BLOOD ADMINISTERED:none  DRAINS: none   LOCAL MEDICATIONS USED:  BUPIVICAINE   SPECIMEN:  No Specimen  DISPOSITION OF SPECIMEN:  N/A  COUNTS:  YES  TOURNIQUET:  * No tourniquets in log *  DICTATION: .Other Dictation: Dictation Number [id number]  PLAN OF CARE: Admit for overnight observation  PATIENT DISPOSITION:  PACU - hemodynamically stable.   Delay start of Pharmacological VTE agent (>24hrs) due to surgical blood loss or risk of bleeding: no

## 2012-06-08 NOTE — Transfer of Care (Signed)
Immediate Anesthesia Transfer of Care Note  Patient: [name] [name]  Procedure(s) Performed: Procedure(s) (LRB): LAPAROSCOPIC VENTRAL HERNIA (Right) INSERTION OF MESH (Right)  Patient Location: PACU  Anesthesia Type: General  Level of Consciousness: awake, alert , oriented, sedated and patient cooperative  Airway & Oxygen Therapy: Patient Spontanous Breathing and Patient connected to face mask oxygen  Post-op Assessment: Report given to PACU RN and Patient moving all extremities  Post vital signs: Reviewed and stable  Complications: No apparent anesthesia complications

## 2012-06-08 NOTE — Plan of Care (Signed)
Problem: Phase I Progression Outcomes Goal: OOB as tolerated unless otherwise ordered Outcome: Completed/Met Date Met:  06/08/12 Dangled and stood at bedside.

## 2012-06-09 MED ORDER — BISACODYL 10 MG RE SUPP
10.0000 mg | Freq: Every day | RECTAL | Status: DC
  Administered 2012-06-09 – 2012-06-12 (×4): 10 mg via RECTAL
  Filled 2012-06-09 (×4): qty 1

## 2012-06-09 NOTE — Evaluation (Signed)
Occupational Therapy Evaluation Patient Details Name: [name] [name] MRN: [id number] DOB: Jun 13, 1936 Today's Date: 06/09/2012 Time: 4098-1191 OT Time Calculation (min): 33 min  OT Assessment / Plan / Recommendation Clinical Impression  This 76 y.o. admitted for hernia repair.  Pt limited by pain, but is motivated.  Currently she requires mod A overall for ADLs.  Feel she will benefit from OT to maximize safety and independence with BADLs to allow pt. to return home with husband at supervision to min A level    OT Assessment  Patient needs continued OT Services    Follow Up Recommendations  Home health OT;Supervision/Assistance - 24 hour    Barriers to Discharge None    Equipment Recommendations  None recommended by PT (pt states she has access to RW)    Recommendations for Other Services    Frequency  Min 2X/week    Precautions / Restrictions Precautions Precautions: Fall Precaution Comments: abdominal binder. On-Q pump R side Restrictions Weight Bearing Restrictions: No       ADL  Eating/Feeding: Simulated;Independent Where Assessed - Eating/Feeding: Bed level Grooming: Simulated;Wash/dry hands;Wash/dry face;Teeth care;Brushing hair;Minimal assistance Where Assessed - Grooming: Supported standing Upper Body Bathing: Simulated;Minimal assistance Where Assessed - Upper Body Bathing: Supported sitting Lower Body Bathing: Simulated;Maximal assistance Where Assessed - Lower Body Bathing: Supported sit to stand Upper Body Dressing: Performed;Moderate assistance Where Assessed - Upper Body Dressing: Unsupported sitting Lower Body Dressing: Simulated;+1 Total assistance Where Assessed - Lower Body Dressing: Supported sit to stand Toilet Transfer: Performed;Minimal assistance Toilet Transfer Method: Sit to Barista: Comfort height toilet Toileting - Clothing Manipulation and Hygiene: Performed;Moderate assistance Where Assessed - Toileting Clothing  Manipulation and Hygiene: Sit to stand from 3-in-1 or toilet Equipment Used: Gait belt;Rolling walker Transfers/Ambulation Related to ADLs: min A ADL Comments: Pt. crossed ankles over knees PTA for LB ADLs, but unable to access feet at this time due to increased pain    OT Diagnosis: Generalized weakness;Acute pain  OT Problem List: Decreased strength;Decreased activity tolerance;Impaired balance (sitting and/or standing);Decreased safety awareness;Pain OT Treatment Interventions: Self-care/ADL training;DME and/or AE instruction;Therapeutic activities;Patient/family education;Balance training   OT Goals Acute Rehab OT Goals OT Goal Formulation: With patient Time For Goal Achievement: 06/16/12 Potential to Achieve Goals: Good ADL Goals Pt Will Perform Lower Body Bathing: with supervision;Sit to stand from chair;Sit to stand from bed ADL Goal: Lower Body Bathing - Progress: Goal set today Pt Will Perform Lower Body Dressing: with supervision;Sit to stand from chair;Sit to stand from bed ADL Goal: Lower Body Dressing - Progress: Goal set today Pt Will Transfer to Toilet: with supervision;Ambulation;Comfort height toilet ADL Goal: Toilet Transfer - Progress: Goal set today Pt Will Perform Toileting - Clothing Manipulation: with supervision;Standing ADL Goal: Toileting - Clothing Manipulation - Progress: Goal set today Pt Will Perform Toileting - Hygiene: with supervision;Sit to stand from 3-in-1/toilet ADL Goal: Toileting - Hygiene - Progress: Goal set today Pt Will Perform Tub/Shower Transfer: with min assist;Ambulation;Shower seat with back ADL Goal: Tub/Shower Transfer - Progress: Goal set today  Visit Information  Last OT Received On: 06/09/12 Assistance Needed: +1 PT/OT Co-Evaluation/Treatment: Yes    Subjective Data  Subjective: "Oh, it hurts when I get moving" Patient Stated Goal: To regain independence   Prior Functioning  Home Living Lives With: Spouse Available Help at  Discharge: Available 24 hours/day;Family Type of Home: House Home Access: Stairs to enter [employer] of Steps: 1 Entrance Stairs-Rails: None Home Layout: Two level;Able to live on main level with  bedroom/bathroom (Pt. sleeps on the couch ) Alternate Level Stairs-Number of Steps: full flight Alternate Level Stairs-Rails: Left Bathroom Shower/Tub: Tub/shower unit;Curtain (1/2 bath on first floor) Bathroom Toilet: Standard Bathroom Accessibility: Yes How Accessible: Accessible via walker Home Adaptive Equipment: Walker - rolling;Shower chair with back;Bedside commode/3-in-1;Straight cane Prior Function Level of Independence: Independent Able to Take Stairs?: Yes Driving: Yes Vocation: Retired Musician: No difficulties Dominant Hand: Right    Cognition  Overall Cognitive Status: Appears within functional limits for tasks assessed/performed Arousal/Alertness: Awake/alert Orientation Level: Appears intact for tasks assessed Behavior During Session: [facility] for tasks performed    Extremity/Trunk Assessment Right Upper Extremity Assessment RUE ROM/Strength/Tone: Within functional levels RUE Coordination: WFL - gross/fine motor Left Upper Extremity Assessment LUE ROM/Strength/Tone: Within functional levels LUE Coordination: WFL - gross/fine motor Right Lower Extremity Assessment RLE ROM/Strength/Tone: Deficits RLE ROM/Strength/Tone Deficits: Strength at least 4/5 with functional activity RLE Coordination: WFL - gross motor Left Lower Extremity Assessment LLE ROM/Strength/Tone: Deficits LLE ROM/Strength/Tone Deficits: Strength at least 4/5 with functional activity LLE Coordination: WFL - gross motor Trunk Assessment Trunk Assessment: Normal   Mobility Bed Mobility Bed Mobility: Rolling Left;Left Sidelying to Sit;Sit to Sidelying Left Rolling Left: 4: Min assist Left Sidelying to Sit: 3: Mod assist Sitting - Scoot to Edge of Bed: 4: Min guard Sit to  Sidelying Left: 3: Mod assist Details for Bed Mobility Assistance: Assist for bil LEs off/onto bed and trunk to upright/sidelying. Increased time. VCs safety, technique, hand placment. Utilized bedpad to assist with positioning, scooting Transfers Transfers: Sit to Stand;Stand to Sit Sit to Stand: 4: Min assist;With upper extremity assist;From bed;From toilet Stand to Sit: 4: Min assist;With upper extremity assist;To bed;To toilet Details for Transfer Assistance: VCs safety, technique, hand placement. Assist to rise, stabilize, control descent.    Exercise    Balance    End of Session OT - End of Session Equipment Utilized During Treatment: Gait belt Activity Tolerance: Patient limited by pain Patient left: in bed;with call bell/phone within reach;with nursing in room Nurse Communication: Mobility status;Patient requests pain meds  GO Functional Limitation: Self care Self Care Current Status [phone]): At least 40 percent but less than 60 percent impaired, limited or restricted Self Care Goal Status (Q6578): At least 1 percent but less than 20 percent impaired, limited or restricted   [name] [name] M 06/09/2012, 3:04 PM

## 2012-06-09 NOTE — Evaluation (Signed)
Physical Therapy Evaluation Patient Details Name: [name] [name] MRN: [id number] DOB: 06-25-1936 Today's Date: 06/09/2012 Time: 7829-5621 PT Time Calculation (min): 32 min  PT Assessment / Plan / Recommendation Clinical Impression  76 yo female s/p lap ventral and R inguinal hernia repair(s). Pt was Independent PTA. On eval pt requires Min-Mod A for mobility, limited by pain and activity tolerance. Recommend HHPT at discharge.     PT Assessment  Patient needs continued PT services    Follow Up Recommendations  Home health PT;Supervision for mobility/OOB    Barriers to Discharge        Equipment Recommendations  None recommended by PT (pt states she has access to RW)    Recommendations for Other Services OT consult   Frequency Min 3X/week    Precautions / Restrictions Precautions Precautions: Fall Precaution Comments: abdominal binder. On-Q pump R side Restrictions Weight Bearing Restrictions: No   Pertinent Vitals/Pain       Mobility  Bed Mobility Bed Mobility: Rolling Left;Left Sidelying to Sit;Sit to Sidelying Left Rolling Left: 4: Min assist Left Sidelying to Sit: 3: Mod assist Sit to Sidelying Left: 3: Mod assist Details for Bed Mobility Assistance: Assist for bil LEs off/onto bed and trunk to upright/sidelying. Increased time. VCs safety, technique, hand placment. Utilized bedpad to assist with positioning, scooting Transfers Transfers: Sit to Stand;Stand to Sit Sit to Stand: 4: Min assist;With upper extremity assist;From bed;From toilet Stand to Sit: 4: Min assist;With upper extremity assist;To bed;To toilet Details for Transfer Assistance: VCs safety, technique, hand placement. Assist to rise, stabilize, control descent.  Ambulation/Gait Ambulation/Gait Assistance: 4: Min assist Ambulation Distance (Feet): 75 Feet Ambulation/Gait Assistance Details: VCs safety, distance from RW. Slow gait speed. Fatigues fairly easily.  Gait Pattern: Step-through  pattern;Decreased stride length;Decreased step length - right;Decreased step length - left    Exercises     PT Diagnosis: Difficulty walking;Generalized weakness;Acute pain  PT Problem List: Decreased strength;Decreased activity tolerance;Decreased mobility;Pain;Decreased knowledge of use of DME PT Treatment Interventions: DME instruction;Gait training;Functional mobility training;Therapeutic activities;Therapeutic exercise;Patient/family education   PT Goals Acute Rehab PT Goals PT Goal Formulation: With patient Time For Goal Achievement: 06/23/12 Potential to Achieve Goals: Good Pt will Roll Supine to Right Side: with supervision PT Goal: Rolling Supine to Right Side - Progress: Goal set today Pt will Roll Supine to Left Side: with supervision PT Goal: Rolling Supine to Left Side - Progress: Goal set today Pt will go Supine/Side to Sit: with supervision PT Goal: Supine/Side to Sit - Progress: Goal set today Pt will go Sit to Supine/Side: with supervision PT Goal: Sit to Supine/Side - Progress: Goal set today Pt will go Sit to Stand: with supervision PT Goal: Sit to Stand - Progress: Goal set today Pt will Ambulate: >150 feet;with supervision;with least restrictive assistive device PT Goal: Ambulate - Progress: Goal set today  Visit Information  Last PT Received On: 06/09/12 Assistance Needed: +1 PT/OT Co-Evaluation/Treatment: Yes    Subjective Data  Subjective: "The back and R side are both hurting now" Patient Stated Goal: Less pain. Get better   Prior Functioning  Home Living Lives With: Spouse Available Help at Discharge: Available 24 hours/day;Family Type of Home: House Home Access: Stairs to enter Entrance Stairs-Number of Steps: 1 Entrance Stairs-Rails: None Home Layout: Two level;Able to live on main level with bedroom/bathroom (Pt. sleeps on the couch ) Alternate Level Stairs-Number of Steps: full flight Alternate Level Stairs-Rails: Left Bathroom Shower/Tub:  Tub/shower unit;Curtain (1/2 bath on first floor) Bathroom Toilet:  Standard Bathroom Accessibility: Yes How Accessible: Accessible via walker Home Adaptive Equipment: Walker - rolling;Shower chair with back;Bedside commode/3-in-1;Straight cane Prior Function Level of Independence: Independent Able to Take Stairs?: Yes Driving: Yes Vocation: Retired Musician: No difficulties Dominant Hand: Right    Cognition  Overall Cognitive Status: Appears within functional limits for tasks assessed/performed Arousal/Alertness: Awake/alert Orientation Level: Appears intact for tasks assessed Behavior During Session: [facility] for tasks performed    Extremity/Trunk Assessment Right Lower Extremity Assessment RLE ROM/Strength/Tone: Deficits RLE ROM/Strength/Tone Deficits: Strength at least 4/5 with functional activity RLE Coordination: WFL - gross motor Left Lower Extremity Assessment LLE ROM/Strength/Tone: Deficits LLE ROM/Strength/Tone Deficits: Strength at least 4/5 with functional activity LLE Coordination: WFL - gross motor Trunk Assessment Trunk Assessment: Normal   Balance    End of Session PT - End of Session Equipment Utilized During Treatment: Gait belt Activity Tolerance: Patient limited by pain;Patient limited by fatigue Patient left: in bed;with call bell/phone within reach  GP Functional Assessment Tool Used: clinical judgement Functional Limitation: Mobility: Walking and moving around Mobility: Walking and Moving Around Current Status (Z6109): At least 40 percent but less than 60 percent impaired, limited or restricted Mobility: Walking and Moving Around Goal Status [phone]): At least 1 percent but less than 20 percent impaired, limited or restricted   [name] [name] Alert [facility] 06/09/2012, 2:48 PM [phone]

## 2012-06-09 NOTE — Progress Notes (Signed)
[name] [name] [id number] [birth year]  CARE TEAM:  PCP: Darnelle Bos, MD  Outpatient Care Team: Patient Care Team: Darnelle Bos, MD as PCP - General (Internal Medicine)  Inpatient Treatment Team: Treatment Team: Attending Provider: Ardeth Sportsman, MD; Technician: Joellyn Haff, NT; Technician: Lynden Ang, NT; Technician: Burnard Bunting, [place]; Registered Nurse: Skipper Cliche, RN  Subjective:  Tol clears but tired of it No n/v Stood up Sore in 1 spot   Objective:  Vital signs:  Filed Vitals:   06/08/12 1800 06/08/12 2200 06/09/12 0209 06/09/12 0540  BP: 132/66 133/68 131/68 141/69  Pulse: 72 71 75 77  Temp: 98.4 F (36.9 C) 99.1 F (37.3 C) 98.9 F (37.2 C) 98.1 F (36.7 C)  TempSrc: Oral Oral Oral Oral  Resp: 18 16 18 18   Height:      Weight:      SpO2: 96% 97% 97% 97%    Last BM Date: 06/07/12  Intake/Output   Yesterday:  06/27 0701 - 06/28 0700 In: 3460 [P.O.:660; I.V.:2800] Out: 2225 [Urine:2125; Blood:100] This shift:     Bowel function:  Flatus: small  BM: no  Physical Exam:  General: Pt awake/alert/oriented x4 in no acute distress Eyes: PERRL, normal EOM.  Sclera clear.  No icterus Neuro: CN II-XII intact w/o focal sensory/motor deficits. Lymph: No head/neck/groin lymphadenopathy Psych:  No delerium/psychosis/paranoia HENT: Normocephalic, Mucus membranes moist.  No thrush Neck: Supple, No tracheal deviation Chest: Mild R lat chest wall pain at stitch sites w good excursion CV:  Pulses intact.  Regular rhythm Abdomen: Soft.  Nondistended.  Mildly tender at incisions only.  No incarcerated hernias. Ext:  SCDs BLE.  No mjr edema.  No cyanosis Skin: No petechiae / purpurae  Results:   Labs: Results for orders placed during the hospital encounter of 06/08/12 (from the past 48 hour(s))  CBC     Status: Abnormal   Collection Time   06/08/12 11:41 AM      Component Value Range Comment   WBC 20.2 (*) 4.0 - 10.5  K/uL    RBC 3.98  3.87 - 5.11 MIL/uL    Hemoglobin 11.7 (*) 12.0 - 15.0 g/dL    HCT 40.9 (*) 81.1 - 46.0 %    MCV 85.2  78.0 - 100.0 fL    MCH 29.4  26.0 - 34.0 pg    MCHC 34.5  30.0 - 36.0 g/dL    RDW 91.4  78.2 - 95.6 %    Platelets 220  150 - 400 K/uL   CREATININE, SERUM     Status: Abnormal   Collection Time   06/08/12 11:41 AM      Component Value Range Comment   Creatinine, Ser 0.93  0.50 - 1.10 mg/dL    GFR calc non Af Amer 60 (*) >90 mL/min    GFR calc Af Amer 69 (*) >90 mL/min     Imaging / Studies: No results found.  Medications / Allergies: per chart  Antibiotics: Anti-infectives     Start     Dose/Rate Route Frequency Ordered Stop   06/08/12 0556   ceFAZolin (ANCEF) IVPB 2 g/50 mL premix        2 g 100 mL/hr over 30 Minutes Intravenous 60 min pre-op 06/08/12 0556 06/08/12 0730          Problem List:  Principal Problem:  *Hernia of Right posterior flank Active Problems:  Back pain  Arthritis  Hypertension  GERD (gastroesophageal reflux disease)  Anxiety  IBS (irritable bowel syndrome)   Assessment  [name] [name]  76 y.o. female  1 Day Post-Op  Procedure(s): LAPAROSCOPIC VENTRAL HERNIA INSERTION OF MESH  Stable  Plan:  -adv diet -VTE prophylaxis- SCDs, etc -mobilize as tolerated to help recovery -pain control regimen -bowel regimen -anxiolysis -PPI for GERD  -d/c when meets criteria - PO pain control the key limiting step ?home 1-2 days?  Not yet..  I discussed the patient's status to the family last night (could not locate family immediately post-op dur to communication error).  Questions were answered.  They expressed understanding & appreciation.   Ardeth Sportsman, M.D., F.A.C.S. Gastrointestinal and Minimally Invasive Surgery [facility] [address] [place], [place] [address] [phone] Main / Paging [phone] Voice Mail   06/09/2012

## 2012-06-10 NOTE — Progress Notes (Signed)
Dr. Michaell Cowing aware via phone pt's urine pink-tinged to cherry-colored. Frequency high this am yet pt going less frequent this pm with more volume noted. No complaints of burning or discomfort. No new orders received.

## 2012-06-10 NOTE — Progress Notes (Signed)
Patient ID: [name] [name], female   DOB: 1936/03/28, 76 y.o.   MRN: [id number]  General Surgery - [facility] - Progress Note  POD# 2  Subjective: Patient complains of right sided abdominal pain at surgical site.  Husband at bedside.  Tolerating diet.  Objective: Vital signs in last 24 hours: Temp:  [97.9 F (36.6 C)-98.8 F (37.1 C)] 98.8 F (37.1 C) (06/28 2200) Pulse Rate:  [76-92] 83  (06/28 2200) Resp:  [18] 18  (06/28 2200) BP: (131-173)/(47-78) 131/47 mmHg (06/28 2200) SpO2:  [95 %-96 %] 96 % (06/28 2200) Last BM Date: 06/10/12  Intake/Output from previous day: 06/28 0701 - 06/29 0700 In: 335 [P.O.:185; I.V.:150] Out: 2300 [Urine:2300]  Exam: HEENT - clear, not icteric Neck - soft Chest - clear bilaterally Cor - RRR, no murmur Abd - soft, binder on; wounds clear and dry; On-Q pump in place; BS present Ext - no significant edema Neuro - grossly intact, no focal deficits  Lab Results:   Basename 06/08/12 1141  WBC 20.2*  HGB 11.7*  HCT 33.9*  PLT 220     Basename 06/08/12 1141  NA --  K --  CL --  CO2 --  GLUCOSE --  BUN --  CREATININE 0.93  CALCIUM --    Studies/Results: No results found.  Assessment / Plan: 1.  Status post ventral hernia repair with mesh  - pain control with oral narcotics  - mobilize - encouraged OOB, ambulation  - home per Dr. Michaell Cowing - likely tomorrow  Velora Heckler, MD, [facility] Office: [phone]  06/10/2012

## 2012-06-10 NOTE — Progress Notes (Signed)
Occupational Therapy Treatment Patient Details Name: [name] [name] MRN: [id number] DOB: Mar 18, 1936 Today's Date: 06/10/2012 Time: 3244-0102 OT Time Calculation (min): 26 min  OT Assessment / Plan / Recommendation Comments on Treatment Session      Follow Up Recommendations       Barriers to Discharge       Equipment Recommendations       Recommendations for Other Services    Frequency     Plan      Precautions / Restrictions Precautions Precautions: Fall Precaution Comments: abdominal binder. On-Q pump R side   Pertinent Vitals/Pain     ADL  Lower Body Bathing: Simulated;Moderate assistance Where Assessed - Lower Body Bathing: Unsupported sit to stand Lower Body Dressing: Simulated;Maximal assistance Where Assessed - Lower Body Dressing: Supported sit to stand Toilet Transfer: Performed;Minimal assistance Toilet Transfer Method: Sit to Barista: Comfort height toilet Tub/Shower Transfer: Paramedic Method: Science writer: Shower seat with back Equipment Used: Rolling walker Transfers/Ambulation Related to ADLs: min guard assist ADL Comments: Pt. able to cross Lt. ankle over Rt. knee.  Unable to cross Rt. ankle over Lt. knee due to pain.  Pt reports husband will assist her at home.  She is eager to discharge    OT Diagnosis:    OT Problem List:   OT Treatment Interventions:     OT Goals    Visit Information  Last OT Received On: 06/10/12    Subjective Data      Prior Functioning       Cognition  Overall Cognitive Status: Appears within functional limits for tasks assessed/performed Arousal/Alertness: Awake/alert Orientation Level: Appears intact for tasks assessed Behavior During Session: [facility] for tasks performed    Mobility Bed Mobility Sit to Sidelying Left: 5: Supervision;HOB flat Transfers Transfers: Sit to Stand;Stand to Sit Sit to Stand: 4: Min assist;With  upper extremity assist;From bed Stand to Sit: 4: Min guard;To bed;With upper extremity assist   Exercises    Balance    End of Session    GO     Jeani Hawking M 06/10/2012, 2:02 PM

## 2012-06-11 NOTE — Progress Notes (Signed)
Patient ID: [name] [name], female   DOB: 07/23/1936, 76 y.o.   MRN: [id number]  General Surgery - [facility] - Progress Note  POD# 3  Subjective: Patient with complaints of pain.  Small oral intake - no nausea or emesis.  Husband at bedside.  Ambulatory.  Objective: Vital signs in last 24 hours: Temp:  [98.1 F (36.7 C)-98.6 F (37 C)] 98.6 F (37 C) (06/30 0645) Pulse Rate:  [69-99] 78  (06/30 0645) Resp:  [18] 18  (06/30 0645) BP: (116-144)/(51-70) 131/70 mmHg (06/30 0645) SpO2:  [94 %-95 %] 94 % (06/30 0645) Last BM Date: 06/10/12  Intake/Output from previous day: 06/29 0701 - 06/30 0700 In: 360 [P.O.:360] Out: 2175 [Urine:2175]  Exam: HEENT - clear, not icteric Neck - soft Chest - clear bilaterally Cor - RRR, no murmur Abd - soft without distension; BS present; BM yesterday; On-Q remains in place; wounds clear and dry Ext - no significant edema Neuro - grossly intact, no focal deficits  Lab Results:   Basename 06/08/12 1141  WBC 20.2*  HGB 11.7*  HCT 33.9*  PLT 220     Basename 06/08/12 1141  NA --  K --  CL --  CO2 --  GLUCOSE --  BUN --  CREATININE 0.93  CALCIUM --    Studies/Results: No results found.  Assessment / Plan: 1.  Status post lap ventral hernia repair with mesh, lap inguinal hernia repair with mesh  - pain control with oral narcotics   - mobilize - encouraged OOB, ambulation   - patient requests one more day in hospital prior to discharge - home per Dr. Michaell Cowing - likely tomorrow  Velora Heckler, MD, [facility] Office: [phone]  06/11/2012

## 2012-06-12 ENCOUNTER — Encounter (HOSPITAL_COMMUNITY): Payer: Self-pay | Admitting: Surgery

## 2012-06-12 MED ORDER — OXYCODONE HCL 15 MG PO TABS: 15.0000 mg | ORAL_TABLET | ORAL | Status: AC | PRN

## 2012-06-12 MED ORDER — BUPIVACAINE 0.25 % ON-Q PUMP DUAL CATH 300 ML
300.0000 mL | INJECTION | Status: DC
  Filled 2012-06-12: qty 300

## 2012-06-12 NOTE — Discharge Instructions (Signed)
HERNIA REPAIR: POST OP INSTRUCTIONS  1. DIET: Follow a light bland diet the first 24 hours after arrival home, such as soup, liquids, crackers, etc.  Be sure to include lots of fluids daily.  Avoid fast food or heavy meals as your are more likely to get nauseated.  Eat a low fat the next few days after surgery. 2. Take your usually prescribed home medications unless otherwise directed. 3. PAIN CONTROL: a. Pain is best controlled by a usual combination of three different methods TOGETHER: i. Ice/Heat ii. Over the counter pain medication iii. Prescription pain medication b. Most patients will experience some swelling and bruising around the hernia(s) such as the bellybutton, groins, or old incisions.  Ice packs or heating pads (30-60 minutes up to 6 times a day) will help. Use ice for the first few days to help decrease swelling and bruising, then switch to heat to help relax tight/sore spots and speed recovery.  Some people prefer to use ice alone, heat alone, alternating between ice & heat.  Experiment to what works for you.  Swelling and bruising can take several weeks to resolve.   c. It is helpful to take an over-the-counter pain medication regularly for the first few weeks.  Choose one of the following that works best for you: i. Naproxen (Aleve, etc)  Two 220mg  tabs twice a day ii. Ibuprofen (Advil, etc) Three 200mg  tabs four times a day (every meal & bedtime) iii. Acetaminophen (Tylenol, etc) 325-650mg  four times a day (every meal & bedtime) d. A  prescription for pain medication should be given to you upon discharge.  Take your pain medication as prescribed.  i. If you are having problems/concerns with the prescription medicine (does not control pain, nausea, vomiting, rash, itching, etc), please call us [phone] to see if we need to switch you to a different pain medicine that will work better for you and/or control your side effect better. ii. If you need a refill on your pain  medication, please contact your pharmacy.  They will contact our office to request authorization. Prescriptions will not be filled after 5 pm or on week-ends. 4. Avoid getting constipated.  Between the surgery and the pain medications, it is common to experience some constipation.  Increasing fluid intake and taking a fiber supplement (such as Metamucil, Citrucel, FiberCon, MiraLax, etc) 1-2 times a day regularly will usually help prevent this problem from occurring.  A mild laxative (prune juice, Milk of Magnesia, MiraLax, etc) should be taken according to package directions if there are no bowel movements after 48 hours.   5. Wash / shower every day.  You may shower over the dressings as they are waterproof.   6. Remove your waterproof bandages 3 days after arriving home.  This includes removing the plastic ON-Q pain pump catheters out of the skin.  You may leave the incision open to air.  You may replace a dressing/Band-Aid to cover the incision for comfort if you wish.  Continue to shower over incision(s) after the dressings are off.    7. ACTIVITIES as tolerated:   a. You may resume regular (light) daily activities beginning the next day--such as daily self-care, walking, climbing stairs--gradually increasing activities as tolerated.  If you can walk 30 minutes without difficulty, it is safe to try more intense activity such as jogging, treadmill, bicycling, low-impact aerobics, swimming, etc. b. Save the most intensive and strenuous activity for last such as sit-ups, heavy lifting, contact sports, etc  Refrain from  any heavy lifting or straining until you are off narcotics for pain control.   c. DO NOT PUSH THROUGH PAIN.  Let pain be your guide: If it hurts to do something, don't do it.  Pain is your body warning you to avoid that activity for another week until the pain goes down. d. You may drive when you are no longer taking prescription pain medication, you can comfortably wear a seatbelt, and  you can safely maneuver your car and apply brakes. e. Bonita Quin may have sexual intercourse when it is comfortable.  8. FOLLOW UP in our office a. Please call [facility] at [phone] to set up an appointment to see your surgeon in the office for a follow-up appointment approximately 2-3 weeks after your surgery. b. Make sure that you call for this appointment the day you arrive home to insure a convenient appointment time. 9.  IF YOU HAVE DISABILITY OR FAMILY LEAVE FORMS, BRING THEM TO THE OFFICE FOR PROCESSING.  DO NOT GIVE THEM TO YOUR DOCTOR.  WHEN TO CALL us [phone]: 1. Poor pain control 2. Reactions / problems with new medications (rash/itching, nausea, etc)  3. Fever over 101.5 F (38.5 C) 4. Inability to urinate 5. Nausea and/or vomiting 6. Worsening swelling or bruising 7. Continued bleeding from incision. 8. Increased pain, redness, or drainage from the incision   The clinic staff is available to answer your questions during regular business hours (8:30am-5pm).  Please don't hesitate to call and ask to speak to one of our nurses for clinical concerns.   If you have a medical emergency, go to the nearest emergency room or call 911.  A surgeon from [facility] is always on call at the hospitals in [facility], [place] [address], [address], [place], [place]  [address] ?  [address] [address], [place], [place]   [address] MAIN: [phone] ? TOLL FREE: [phone] ? FAX: [phone] [email or web address]

## 2012-06-12 NOTE — Progress Notes (Signed)
Pt is alert and oriented, vital signs are stable, discharge instructions reviewed with patient and spouse, pt to follow up with MD Gross, pt sent home with On Q Pump as ordered, other incisions x19 were clean dry and intact, questions and concerns answered, IV removed, will continue to monitor Means, Yoshua Geisinger N RN 06-12-12 13:29pm

## 2012-06-12 NOTE — Discharge Summary (Signed)
Physician Discharge Summary  Patient ID: [name] [name] MRN: [id number] DOB/AGE: September 24, 1936 76 y.o.  Admit date: 06/08/2012 Discharge date: 06/12/2012  Patient Care Team: Darnelle Bos, MD as PCP - General (Internal Medicine)  Admission Diagnoses: Principal Problem:  *Hernia of Right posterior flank Active Problems:  Back pain  Arthritis  Hypertension  GERD (gastroesophageal reflux disease)  Anxiety  IBS (irritable bowel syndrome)  Discharge Diagnoses:  Principal Problem:  *Hernia of Right posterior flank Active Problems:  Back pain  Arthritis  Hypertension  GERD (gastroesophageal reflux disease)  Anxiety  IBS (irritable bowel syndrome)   Discharged Condition: good  Hospital Course: The patient is a pleasant woman with chronic pain on chronic narcotics.  Has a worsening right flank incisional hernia.  She underwent laparoscopic repair.  She was found to have an inguinal hernia on the same side.  The mesh repair that as well.  Postoperatively she required parenteral narcotic pain control.  She was gradually transitioned over to her home pain regimen with increased oxycodone to compensate.  Physical and occupational therapy consultations were made & she gradually mobilized.  By the time of discharge she was walking with a walker, had adequate oral pain control, was urinating, was having bowel movements.  Was tolerating a solid diet.  Therefore I felt it was safe for her to be discharged home with close followup.  We will set up physical and occupational therapy consultations to help her continue to recover.  I will follow her closely.  I discussed instructions with the patient in detail.  They are written as well.  Consults: None  Significant Diagnostic Studies:   Treatments: surgery: Lap LOA & repair of Right flank & inguinal hernias  Discharge Exam: Blood pressure 138/74, pulse 79, temperature 97.8 F (36.6 C), temperature source Oral, resp. rate 18, height 5\' 1"   (1.549 m), weight 136 lb (61.689 kg), SpO2 97.00%.  General: Pt awake/alert/oriented x4 in no major acute distress Eyes: PERRL, normal EOM. Sclera nonicteric Neuro: CN II-XII intact w/o focal sensory/motor deficits. Lymph: No head/neck/groin lymphadenopathy Psych:  No delerium/psychosis/paranoia HENT: Normocephalic, Mucus membranes moist.  No thrush Neck: Supple, No tracheal deviation Chest: No pain.  Good respiratory excursion. CV:  Pulses intact.  Regular rhythm Abdomen: Soft, Nondistended.  Mild RLQ TTP.  No incarcerated hernias.  No cellulitis Ext:  SCDs BLE.  No significant edema.  No cyanosis Skin: No petechiae / purpurae   Disposition: 01-Home or Self Care  Discharge Orders    Future Orders Please Complete By Expires   Diet - low sodium heart healthy      Increase activity slowly        Medication List  As of 06/12/2012  7:03 AM   TAKE these medications         ALPRAZolam 1 MG tablet   Commonly known as: XANAX   Take 1 mg by mouth 4 (four) times daily. As needed      atenolol 50 MG tablet   Commonly known as: TENORMIN   Take 50 mg by mouth daily with breakfast.      busPIRone 15 MG tablet   Commonly known as: BUSPAR   Take 15 mg by mouth daily with breakfast.      cyclobenzaprine 10 MG tablet   Commonly known as: FLEXERIL   Take 10 mg by mouth every 8 (eight) hours as needed. For muscle spasms.      esomeprazole 40 MG capsule   Commonly known as: NEXIUM   Take  40 mg by mouth daily before breakfast.      fenofibrate 145 MG tablet   Commonly known as: TRICOR   Take 145 mg by mouth daily with breakfast.      furosemide 20 MG tablet   Commonly known as: LASIX   Take 20 mg by mouth daily with breakfast.      hydrochlorothiazide 25 MG tablet   Commonly known as: HYDRODIURIL   Take 25 mg by mouth daily with breakfast.      hyoscyamine 0.375 MG 12 hr tablet   Commonly known as: LEVBID   Take 0.375 mg by mouth every 12 (twelve) hours as needed. For stomach  cramps      levothyroxine 112 MCG tablet   Commonly known as: SYNTHROID, LEVOTHROID   Take 112 mcg by mouth daily with breakfast.      lidocaine 5 %   Commonly known as: LIDODERM   Place 1-3 patches onto the skin daily. Remove & Discard patch within 12 hours or as directed by MD, as needed per pt      oxyCODONE 15 MG immediate release tablet   Commonly known as: ROXICODONE   Take 15-30 mg by mouth every 6 (six) hours as needed. For pain.      oxyCODONE 15 MG immediate release tablet   Commonly known as: ROXICODONE   Take 1-2 tablets (15-30 mg total) by mouth every 6 (six) hours as needed for pain.      oxyCODONE 15 MG immediate release tablet   Commonly known as: ROXICODONE   Take 1-2 tablets (15-30 mg total) by mouth every 4 (four) hours as needed for pain.      penicillin v potassium 500 MG tablet   Commonly known as: VEETID   Take 500 mg by mouth 4 (four) times daily. Patient started on 06/05/12 .  Patient is to take every 6 hours for a total of 30 pills per patient.      potassium chloride SA 20 MEQ tablet   Commonly known as: K-DUR,KLOR-CON   Take 20 mEq by mouth 2 (two) times daily.      venlafaxine XR 75 MG 24 hr capsule   Commonly known as: EFFEXOR-XR   Take 75 mg by mouth 2 (two) times daily.      zolpidem 10 MG tablet   Commonly known as: AMBIEN   Take 20 mg by mouth at bedtime.           Follow-up Information    Follow up with Mariadelaluz Guggenheim C., MD in 2 weeks.   Contact information:   [employer] [address] [place] [address] [phone]          Signed: Ardeth Sportsman. 06/12/2012, 7:03 AM

## 2012-06-14 ENCOUNTER — Telehealth (INDEPENDENT_AMBULATORY_CARE_PROVIDER_SITE_OTHER): Payer: Self-pay

## 2012-06-14 ENCOUNTER — Telehealth (INDEPENDENT_AMBULATORY_CARE_PROVIDER_SITE_OTHER): Payer: Self-pay | Admitting: General Surgery

## 2012-06-14 DIAGNOSIS — R300 Dysuria: Secondary | ICD-10-CM

## 2012-06-14 NOTE — Telephone Encounter (Signed)
Per Dr Michaell Cowing, patient really needs a urinalysis prior to getting antibiotics. She may just be having sensitivity in the bladder due to the mesh and he would not want to treat the patient with antibiotics when unnecessary. Patient should be up moving around as much as possible and occupational therapy should be coming out to work with her. Called patient to discuss and number is busy.

## 2012-06-14 NOTE — Telephone Encounter (Signed)
Patient's husband called stating patient is having burning and difficulty with urination that has gradually gotten worse. Patient had hernia repair last week. Husband states that patient can not make it out to have any testing done when I advised we would probably need a urinalysis or have patient see her PCP to be diagnosed. He states she is having too difficult of a time moving around. Please advise.

## 2012-06-14 NOTE — Telephone Encounter (Signed)
Spoke with patient's husband and explained the importance of making sure antibiotics are necessary prior to prescribing them. I will put in an order for a urinalysis for when they are able to make it to [facility] lab. I explained where that was located. They have not heard from PT or Occupational therapy. I told them we would check on that referral and make sure they are scheduled to go out to see her. He will call with any questions. Urinalysis order faxed to [facility].

## 2012-06-14 NOTE — Telephone Encounter (Signed)
Called [facility] to check on the status of a referral that was made on the pt when she was discharged from the hospital. The nurse has an appt to see the pt on Friday. They will call the pt before they go to the home.

## 2012-06-30 DIAGNOSIS — M545 Low back pain: Secondary | ICD-10-CM | POA: Diagnosis not present

## 2012-07-12 ENCOUNTER — Ambulatory Visit (INDEPENDENT_AMBULATORY_CARE_PROVIDER_SITE_OTHER): Payer: Medicare Other | Admitting: Surgery

## 2012-07-12 ENCOUNTER — Encounter (INDEPENDENT_AMBULATORY_CARE_PROVIDER_SITE_OTHER): Payer: Self-pay | Admitting: Surgery

## 2012-07-12 VITALS — BP 130/70 | HR 64 | Temp 97.0°F | Resp 12 | Ht 61.0 in | Wt 131.4 lb

## 2012-07-12 DIAGNOSIS — K458 Other specified abdominal hernia without obstruction or gangrene: Secondary | ICD-10-CM

## 2012-07-12 NOTE — Progress Notes (Signed)
Subjective:     Patient ID: [name] [name], female   DOB: 04-Sep-1936, 76 y.o.   MRN: [id number]  HPI  [name] [name]  [birth year] [id number]  Patient Care Team: Darnelle Bos, MD as PCP - General (Internal Medicine)  This patient is a 76 y.o.female who presents today for surgical evaluation.   Procedure: Laparoscopic: Mobilization lysis lesions and repair of right flank ventral hernias 06/08/2012  The patient comes today with her husband.  Soreness is going down.  Still on some oxycodone.  She gets that from her chronic pain physician.  Overall the pain is gradually improving.  Walking better.  Eating well.  Regular bowel movements.  No fevers or chills.  Energy level slowly returning.  Definitely improved since discharge from hospital  Patient Active Problem List  Diagnosis  . Hernia of Right posterior flank  . Back pain  . Arthritis  . Hypertension  . GERD (gastroesophageal reflux disease)  . Anxiety  . IBS (irritable bowel syndrome)    Past Medical History  Diagnosis Date  . Hypertension   . Hyperlipidemia   . Thyroid disease   . GERD (gastroesophageal reflux disease)   . Anxiety   . IBS (irritable bowel syndrome)   . Back pain   . Blood transfusion     at age of 4  . Hypothyroidism   . H/O hiatal hernia   . Arthritis     generalized   . Anemia     hx of years ago     Past Surgical History  Procedure Date  . Back surgery   . Cholecystectomy   . Tonsillectomy   . Abdominal hysterectomy     partial  . Joint replacement     left shoulder replacement   . Ventral hernia repair 06/08/2012    Procedure: LAPAROSCOPIC VENTRAL HERNIA;  Surgeon: Ardeth Sportsman, MD;  Location: [facility];  Service: General;  Laterality: Right;    History   Social History  . Marital Status: Married    Spouse Name: N/A    Number of Children: N/A  . Years of Education: N/A   Occupational History  . Not on file.   Social History Main Topics  . Smoking status: Never Smoker   .  Smokeless tobacco: Never Used  . Alcohol Use: No  . Drug Use: No  . Sexually Active:    Other Topics Concern  . Not on file   Social History Narrative  . No narrative on file    Family History  Problem Relation Age of Onset  . Heart disease Mother   . Cancer Father     lung    Current Outpatient Prescriptions  Medication Sig Dispense Refill  . ALPRAZolam (XANAX) 1 MG tablet Take 1 mg by mouth 4 (four) times daily. As needed      . atenolol (TENORMIN) 50 MG tablet Take 50 mg by mouth daily with breakfast.       . busPIRone (BUSPAR) 15 MG tablet Take 15 mg by mouth daily with breakfast.       . cyclobenzaprine (FLEXERIL) 10 MG tablet Take 10 mg by mouth every 8 (eight) hours as needed. For muscle spasms.      [name] esomeprazole (NEXIUM) 40 MG capsule Take 40 mg by mouth daily before breakfast.      . fenofibrate (TRICOR) 145 MG tablet Take 145 mg by mouth daily with breakfast.       . furosemide (LASIX) 20 MG tablet Take  20 mg by mouth daily with breakfast.       . hydrochlorothiazide (HYDRODIURIL) 25 MG tablet Take 25 mg by mouth daily with breakfast.       . hyoscyamine (LEVBID) 0.375 MG 12 hr tablet Take 0.375 mg by mouth every 12 (twelve) hours as needed. For stomach cramps      . levothyroxine (SYNTHROID, LEVOTHROID) 112 MCG tablet Take 112 mcg by mouth daily with breakfast.       . lidocaine (LIDODERM) 5 % Place 1-3 patches onto the skin daily. Remove & Discard patch within 12 hours or as directed by MD, as needed per pt      . oxyCODONE (ROXICODONE) 15 MG immediate release tablet Take 15-30 mg by mouth every 6 (six) hours as needed. For pain.      [name] penicillin v potassium (VEETID) 500 MG tablet Take 500 mg by mouth 4 (four) times daily. Patient started on 06/05/12 .  Patient is to take every 6 hours for a total of 30 pills per patient.      . potassium chloride SA (K-DUR,KLOR-CON) 20 MEQ tablet Take 20 mEq by mouth 2 (two) times daily.      [name] venlafaxine XR (EFFEXOR-XR) 75 MG 24 hr  capsule Take 75 mg by mouth 2 (two) times daily.      [name] zolpidem (AMBIEN) 10 MG tablet Take 20 mg by mouth at bedtime.          Allergies  Allergen Reactions  . Yellow Dyes (Non-Tartrazine) Itching    Makes patient nervous     BP 130/70  Pulse 64  Temp 97 F (36.1 C) (Temporal)  Resp 12  Ht 5\' 1"  (1.549 m)  Wt 131 lb 6.4 oz (59.603 kg)  BMI 24.83 kg/m2  No results found.   Review of Systems  Constitutional: Negative for fever, chills and diaphoresis.  HENT: Negative for ear pain, sore throat and trouble swallowing.   Eyes: Negative for photophobia and visual disturbance.  Respiratory: Negative for cough and choking.   Cardiovascular: Negative for chest pain and palpitations.  Gastrointestinal: Negative for nausea, vomiting, abdominal pain, diarrhea, constipation, abdominal distention, anal bleeding and rectal pain.  Genitourinary: Negative for dysuria, frequency and difficulty urinating.  Musculoskeletal: Positive for myalgias, back pain and arthralgias.  Skin: Negative for color change, pallor and rash.  Neurological: Negative for dizziness, speech difficulty, weakness and numbness.  Hematological: Negative for adenopathy.  Psychiatric/Behavioral: Negative for confusion and agitation. The patient is not nervous/anxious.        Objective:   Physical Exam  Constitutional: She is oriented to person, place, and time. She appears well-developed and well-nourished. No distress.  HENT:  Head: Normocephalic.  Mouth/Throat: Oropharynx is clear and moist. No oropharyngeal exudate.  Eyes: Conjunctivae and EOM are normal. Pupils are equal, round, and reactive to light. No scleral icterus.  Neck: Normal range of motion. No tracheal deviation present.  Cardiovascular: Normal rate and intact distal pulses.   Pulmonary/Chest: Effort normal. No respiratory distress. She exhibits no tenderness.  Abdominal: Soft. She exhibits no distension. There is no tenderness. Hernia confirmed  negative in the right inguinal area and confirmed negative in the left inguinal area.       Incisions clean with normal healing ridges.  No hernias  Genitourinary: No vaginal discharge found.  Musculoskeletal: Normal range of motion. She exhibits no tenderness.  Lymphadenopathy:       Right: No inguinal adenopathy present.       Left: No  inguinal adenopathy present.  Neurological: She is alert and oriented to person, place, and time. She displays tremor. No cranial nerve deficit or sensory deficit. She exhibits normal muscle tone. Coordination normal. GCS eye subscore is 4. GCS verbal subscore is 5. GCS motor subscore is 6.       Mild facial tics/tremor  Skin: Skin is warm and dry. No rash noted. She is not diaphoretic.  Psychiatric: She has a normal mood and affect. Her behavior is normal.       Assessment:     One month status post repair of right flank ventral hernias that had incarcerated colon    Plan:     Increase activity as tolerated.  Do not push through pain.  Heat and anti-inflammatories as tolerated to deal with the soreness.  Advanced on diet as tolerated. Bowel regimen to avoid problems.  Return to clinic in a month versus p.r.n. The patient definitely feels like she is made improvement.  She postop trouble and just calling us if she needs [place].  She feels reassured.  The patient & husband expressed understanding and appreciation

## 2012-07-12 NOTE — Patient Instructions (Signed)
Managing Pain  Pain after surgery or related to activity is often due to strain/injury to muscle, tendon, nerves and/or incisions.  This pain is usually short-term and will improve in a few months.   Many people find it helpful to do the following things TOGETHER to help speed the process of healing and to get back to regular activity more quickly:  1. Avoid heavy physical activity a.  no lifting greater than 20 pounds b. Do not "push through" the pain.  Listen to your body and avoid positions and maneuvers than reproduce the pain c. Walking is okay as tolerated, but go slowly and stop when getting sore.  d. Remember: If it hurts to do it, then don't do it! 2. Take Anti-inflammatory medication  a. Take with food/snack around the clock for 1-2 weeks i. This helps the muscle and nerve tissues become less irritable and calm down faster b. Choose ONE of the following over-the-counter medications: i. Naproxen 220mg tabs (ex. Aleve) 1-2 pills twice a day  ii. Ibuprofen 200mg tabs (ex. Advil, Motrin) 3-4 pills with every meal and just before bedtime iii. Acetaminophen 500mg tabs (Tylenol) 1-2 pills with every meal and just before bedtime 3. Use a Heating pad or Ice/Cold Pack a. 4-6 times a day b. May use warm bath/hottub  or showers 4. Try Gentle Massage and/or Stretching  a. at the area of pain many times a day b. stop if you feel pain - do not overdo it  Try these steps together to help you body heal faster and avoid making things get worse.  Doing just one of these things may not be enough.    If you are not getting better after two weeks or are noticing you are getting worse, contact our office for further advice; we may need to re-evaluate you & see what other things we can do to help.  

## 2012-08-08 DIAGNOSIS — G4731 Primary central sleep apnea: Secondary | ICD-10-CM | POA: Diagnosis not present

## 2012-08-29 DIAGNOSIS — M545 Low back pain: Secondary | ICD-10-CM | POA: Diagnosis not present

## 2012-09-18 DIAGNOSIS — N183 Chronic kidney disease, stage 3 unspecified: Secondary | ICD-10-CM | POA: Diagnosis not present

## 2012-09-18 DIAGNOSIS — I10 Essential (primary) hypertension: Secondary | ICD-10-CM | POA: Diagnosis not present

## 2012-09-18 DIAGNOSIS — Z23 Encounter for immunization: Secondary | ICD-10-CM | POA: Diagnosis not present

## 2012-10-26 DIAGNOSIS — M545 Low back pain: Secondary | ICD-10-CM | POA: Diagnosis not present

## 2012-12-26 DIAGNOSIS — M545 Low back pain: Secondary | ICD-10-CM | POA: Diagnosis not present

## 2012-12-28 DIAGNOSIS — M545 Low back pain: Secondary | ICD-10-CM | POA: Diagnosis not present

## 2013-01-08 DIAGNOSIS — G4734 Idiopathic sleep related nonobstructive alveolar hypoventilation: Secondary | ICD-10-CM | POA: Diagnosis not present

## 2013-01-08 DIAGNOSIS — M26629 Arthralgia of temporomandibular joint, unspecified side: Secondary | ICD-10-CM | POA: Diagnosis not present

## 2013-01-08 DIAGNOSIS — G4737 Central sleep apnea in conditions classified elsewhere: Secondary | ICD-10-CM | POA: Diagnosis not present

## 2013-01-08 DIAGNOSIS — T426X5A Adverse effect of other antiepileptic and sedative-hypnotic drugs, initial encounter: Secondary | ICD-10-CM | POA: Diagnosis not present

## 2013-01-11 ENCOUNTER — Other Ambulatory Visit: Payer: Self-pay | Admitting: Dermatology

## 2013-01-11 DIAGNOSIS — D485 Neoplasm of uncertain behavior of skin: Secondary | ICD-10-CM | POA: Diagnosis not present

## 2013-01-11 DIAGNOSIS — L570 Actinic keratosis: Secondary | ICD-10-CM | POA: Diagnosis not present

## 2013-01-11 DIAGNOSIS — Z85828 Personal history of other malignant neoplasm of skin: Secondary | ICD-10-CM | POA: Diagnosis not present

## 2013-01-11 DIAGNOSIS — L719 Rosacea, unspecified: Secondary | ICD-10-CM | POA: Diagnosis not present

## 2013-03-05 DIAGNOSIS — M545 Low back pain: Secondary | ICD-10-CM | POA: Diagnosis not present

## 2013-05-11 ENCOUNTER — Encounter: Payer: Self-pay | Admitting: Neurology

## 2013-05-24 DIAGNOSIS — L570 Actinic keratosis: Secondary | ICD-10-CM | POA: Diagnosis not present

## 2013-05-29 DIAGNOSIS — Z961 Presence of intraocular lens: Secondary | ICD-10-CM | POA: Diagnosis not present

## 2013-05-29 DIAGNOSIS — H35379 Puckering of macula, unspecified eye: Secondary | ICD-10-CM | POA: Diagnosis not present

## 2013-05-29 DIAGNOSIS — H26499 Other secondary cataract, unspecified eye: Secondary | ICD-10-CM | POA: Diagnosis not present

## 2013-05-29 DIAGNOSIS — H40029 Open angle with borderline findings, high risk, unspecified eye: Secondary | ICD-10-CM | POA: Diagnosis not present

## 2013-06-28 DIAGNOSIS — H40059 Ocular hypertension, unspecified eye: Secondary | ICD-10-CM | POA: Diagnosis not present

## 2013-06-28 DIAGNOSIS — H35379 Puckering of macula, unspecified eye: Secondary | ICD-10-CM | POA: Diagnosis not present

## 2013-06-28 DIAGNOSIS — H04129 Dry eye syndrome of unspecified lacrimal gland: Secondary | ICD-10-CM | POA: Diagnosis not present

## 2013-06-28 DIAGNOSIS — H26499 Other secondary cataract, unspecified eye: Secondary | ICD-10-CM | POA: Diagnosis not present

## 2013-07-10 DIAGNOSIS — E039 Hypothyroidism, unspecified: Secondary | ICD-10-CM | POA: Diagnosis not present

## 2013-07-10 DIAGNOSIS — R5381 Other malaise: Secondary | ICD-10-CM | POA: Diagnosis not present

## 2013-07-10 DIAGNOSIS — R197 Diarrhea, unspecified: Secondary | ICD-10-CM | POA: Diagnosis not present

## 2013-07-10 DIAGNOSIS — R159 Full incontinence of feces: Secondary | ICD-10-CM | POA: Diagnosis not present

## 2013-07-10 DIAGNOSIS — R5383 Other fatigue: Secondary | ICD-10-CM | POA: Diagnosis not present

## 2013-07-11 DIAGNOSIS — R197 Diarrhea, unspecified: Secondary | ICD-10-CM | POA: Diagnosis not present

## 2013-07-11 DIAGNOSIS — E039 Hypothyroidism, unspecified: Secondary | ICD-10-CM | POA: Diagnosis not present

## 2013-07-19 DIAGNOSIS — R197 Diarrhea, unspecified: Secondary | ICD-10-CM | POA: Diagnosis not present

## 2013-07-24 ENCOUNTER — Other Ambulatory Visit: Payer: Self-pay | Admitting: Internal Medicine

## 2013-07-24 DIAGNOSIS — D126 Benign neoplasm of colon, unspecified: Secondary | ICD-10-CM | POA: Diagnosis not present

## 2013-07-24 DIAGNOSIS — R197 Diarrhea, unspecified: Secondary | ICD-10-CM | POA: Diagnosis not present

## 2013-08-15 DIAGNOSIS — N183 Chronic kidney disease, stage 3 unspecified: Secondary | ICD-10-CM | POA: Diagnosis not present

## 2013-08-15 DIAGNOSIS — M545 Low back pain, unspecified: Secondary | ICD-10-CM | POA: Diagnosis not present

## 2013-08-15 DIAGNOSIS — I10 Essential (primary) hypertension: Secondary | ICD-10-CM | POA: Diagnosis not present

## 2013-09-28 DIAGNOSIS — Z23 Encounter for immunization: Secondary | ICD-10-CM | POA: Diagnosis not present

## 2014-02-13 DIAGNOSIS — G479 Sleep disorder, unspecified: Secondary | ICD-10-CM | POA: Diagnosis not present

## 2014-02-13 DIAGNOSIS — M545 Low back pain, unspecified: Secondary | ICD-10-CM | POA: Diagnosis not present

## 2014-02-13 DIAGNOSIS — I10 Essential (primary) hypertension: Secondary | ICD-10-CM | POA: Diagnosis not present

## 2014-02-13 DIAGNOSIS — E039 Hypothyroidism, unspecified: Secondary | ICD-10-CM | POA: Diagnosis not present

## 2014-03-18 DIAGNOSIS — H02109 Unspecified ectropion of unspecified eye, unspecified eyelid: Secondary | ICD-10-CM | POA: Diagnosis not present

## 2014-03-18 DIAGNOSIS — H02839 Dermatochalasis of unspecified eye, unspecified eyelid: Secondary | ICD-10-CM | POA: Diagnosis not present

## 2014-05-15 DIAGNOSIS — Z981 Arthrodesis status: Secondary | ICD-10-CM | POA: Diagnosis not present

## 2014-06-18 ENCOUNTER — Emergency Department (HOSPITAL_COMMUNITY)
Admission: EM | Admit: 2014-06-18 | Discharge: 2014-06-18 | Disposition: A | Discharge status: Home / Self Care | Payer: Medicare Other | Attending: Emergency Medicine | Admitting: Emergency Medicine

## 2014-06-18 ENCOUNTER — Emergency Department (HOSPITAL_COMMUNITY): Payer: Medicare Other

## 2014-06-18 ENCOUNTER — Encounter (HOSPITAL_COMMUNITY): Payer: Self-pay | Admitting: Emergency Medicine

## 2014-06-18 DIAGNOSIS — K589 Irritable bowel syndrome without diarrhea: Secondary | ICD-10-CM | POA: Insufficient documentation

## 2014-06-18 DIAGNOSIS — F411 Generalized anxiety disorder: Secondary | ICD-10-CM | POA: Diagnosis not present

## 2014-06-18 DIAGNOSIS — I10 Essential (primary) hypertension: Secondary | ICD-10-CM | POA: Diagnosis not present

## 2014-06-18 DIAGNOSIS — E079 Disorder of thyroid, unspecified: Secondary | ICD-10-CM | POA: Diagnosis not present

## 2014-06-18 DIAGNOSIS — S42033A Displaced fracture of lateral end of unspecified clavicle, initial encounter for closed fracture: Secondary | ICD-10-CM | POA: Diagnosis not present

## 2014-06-18 DIAGNOSIS — Y9389 Activity, other specified: Secondary | ICD-10-CM | POA: Insufficient documentation

## 2014-06-18 DIAGNOSIS — Z96619 Presence of unspecified artificial shoulder joint: Secondary | ICD-10-CM | POA: Diagnosis not present

## 2014-06-18 DIAGNOSIS — Z79899 Other long term (current) drug therapy: Secondary | ICD-10-CM | POA: Insufficient documentation

## 2014-06-18 DIAGNOSIS — Z862 Personal history of diseases of the blood and blood-forming organs and certain disorders involving the immune mechanism: Secondary | ICD-10-CM | POA: Diagnosis not present

## 2014-06-18 DIAGNOSIS — M129 Arthropathy, unspecified: Secondary | ICD-10-CM | POA: Diagnosis not present

## 2014-06-18 DIAGNOSIS — S51809A Unspecified open wound of unspecified forearm, initial encounter: Secondary | ICD-10-CM | POA: Diagnosis not present

## 2014-06-18 DIAGNOSIS — S42002A Fracture of unspecified part of left clavicle, initial encounter for closed fracture: Secondary | ICD-10-CM

## 2014-06-18 DIAGNOSIS — Y9289 Other specified places as the place of occurrence of the external cause: Secondary | ICD-10-CM | POA: Insufficient documentation

## 2014-06-18 DIAGNOSIS — E039 Hypothyroidism, unspecified: Secondary | ICD-10-CM | POA: Insufficient documentation

## 2014-06-18 DIAGNOSIS — K219 Gastro-esophageal reflux disease without esophagitis: Secondary | ICD-10-CM | POA: Diagnosis not present

## 2014-06-18 DIAGNOSIS — E785 Hyperlipidemia, unspecified: Secondary | ICD-10-CM | POA: Diagnosis not present

## 2014-06-18 DIAGNOSIS — W010XXA Fall on same level from slipping, tripping and stumbling without subsequent striking against object, initial encounter: Secondary | ICD-10-CM | POA: Insufficient documentation

## 2014-06-18 DIAGNOSIS — S42009A Fracture of unspecified part of unspecified clavicle, initial encounter for closed fracture: Secondary | ICD-10-CM | POA: Diagnosis not present

## 2014-06-18 MED ORDER — OXYCODONE-ACETAMINOPHEN 5-325 MG PO TABS
2.0000 | ORAL_TABLET | Freq: Once | ORAL | Status: AC
  Administered 2014-06-18: 2 via ORAL
  Filled 2014-06-18: qty 2

## 2014-06-18 MED ORDER — HYDROCODONE-ACETAMINOPHEN 5-325 MG PO TABS: 2.0000 | ORAL_TABLET | ORAL | Status: DC | PRN

## 2014-06-18 NOTE — Discharge Instructions (Signed)
Wear sling-and-swathe as applied.  Apply ice for 20 minutes every 2 hours while awake for the next 2 days.  Hydrocodone as needed for pain.  Per Dr. French Ana, followup with Dr. Percell Miller in 7-10 days for a recheck.   Clavicle Fracture The clavicle, also called the collarbone, is the long bone that connects your shoulder to your rib cage. You can feel your collarbone at the top of your shoulders and rib cage. A clavicle fracture is a broken clavicle. It is a common injury that can happen at any age.  CAUSES Common causes of a clavicle fracture include:  A direct blow to your shoulder.  A car accident.  A fall, especially if you try to break your fall with an outstretched arm. RISK FACTORS You may be at increased risk if:  You are younger than 25 years or older than 71 years. Most clavicle fractures happen to people who are younger than 25 years.  You are a female.  You play contact sports. SIGNS AND SYMPTOMS A fractured clavicle is painful. It also makes it hard to move your arm. Other signs and symptoms may include:  A shoulder that drops downward and forward.  Pain when trying to lift your shoulder.  Bruising, swelling, and tenderness over your clavicle.  A grinding noise when you try to move your shoulder.  A bump over your clavicle. DIAGNOSIS Your health care provider can usually diagnose a clavicle fracture by asking about your injury and examining your shoulder and clavicle. He or she may take an X-ray to determine the position of your clavicle. TREATMENT Treatment depends on the position of your clavicle after the fracture:  If the broken ends of the bone are not out of place, your health care provider may put your arm in a sling or wrap a support bandage around your chest (figure-of-eight wrap).  If the broken ends of the bone are out of place, you may need surgery. Surgery may involve placing screws, pins, or plates to keep your clavicle stable while it heals. Healing  may take about 3 months. When your health care provider thinks your fracture has healed enough, you may have to do physical therapy to regain normal movement and build up your arm strength. HOME CARE INSTRUCTIONS   Apply ice to the injured area:  Put ice in a plastic bag.  Place a towel between your skin and the bag.  Leave the ice on for 20 minutes, 2-3 times a day.  If you have a wrap or splint:  Wear it all the time, and remove it only to take a bath or shower.  When you bathe or shower, keep your shoulder in the same position as when the sling or wrap is on.  Do not lift your arm.  If you have a figure-of-eight wrap:  Another person must tighten it every day.  It should be tight enough to hold your shoulders back.  Allow enough room to place your index finger between your body and the strap.  Loosen the wrap immediately if you feel numbness or tingling in your hands.  Only take medicines as directed by your health care provider.  Avoid activities that make the injury or pain worse for 4-6 weeks after surgery.  Keep all follow-up appointments. SEEK MEDICAL CARE IF:  Your medicine is not helping to relieve pain and swelling. SEEK IMMEDIATE MEDICAL CARE IF:  Your arm is numb, cold, or pale, even when the splint is loose. MAKE SURE YOU:  Understand these instructions.  Will watch your condition.  Will get help right away if you are not doing well or get worse. Document Released: 09/08/2005 Document Revised: 12/04/2013 Document Reviewed: 10/22/2013 [facility] Patient Information 2015 [place], [place]. This information is not intended to replace advice given to you by your health care provider. Make sure you discuss any questions you have with your health care provider.

## 2014-06-18 NOTE — ED Notes (Signed)
Pt reports falling up the steps while trying to carry something up the stairs, no loc. Having left shoulder pain and skin tear to left forearm. Decreased rom to left arm, +radial pulse.

## 2014-06-18 NOTE — ED Notes (Signed)
Placed sling on left arm and gauze dressing on left arm abrasion

## 2014-06-18 NOTE — ED Provider Notes (Signed)
CSN: [id number]     Arrival date & time 06/18/14  1314 History   First MD Initiated Contact with Patient 06/18/14 1329     Chief Complaint  Patient presents with  . Fall     (Consider location/radiation/quality/duration/timing/severity/associated sxs/prior Treatment) HPI Comments: Patient is a 78 year old female with past medical history hypertension, shoulder replacement. She presents today with complaints of left shoulder pain after a fall. 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 She hurt her left shoulder. She also has a skin tear to the dorsal aspect of her left forearm.  Patient is a 78 y.o. female presenting with fall. The history is provided by the patient.  Fall This is a new problem. The current episode started less than 1 hour ago. The problem occurs constantly. The problem has not changed since onset.Pertinent negatives include no abdominal pain, no headaches and no shortness of breath. Nothing aggravates the symptoms. Nothing relieves the symptoms. She has tried nothing for the symptoms. The treatment provided no relief.    Past Medical History  Diagnosis Date  . Hypertension   . Hyperlipidemia   . Thyroid disease   . GERD (gastroesophageal reflux disease)   . Anxiety   . IBS (irritable bowel syndrome)   . Back pain   . Blood transfusion     at age of 4  . Hypothyroidism   . H/O hiatal hernia   . Arthritis     generalized   . Anemia     hx of years ago    Past Surgical History  Procedure Laterality Date  . Back surgery    . Cholecystectomy    . Tonsillectomy    . Abdominal hysterectomy      partial  . Joint replacement      left shoulder replacement   . Ventral hernia repair  06/08/2012    Procedure: LAPAROSCOPIC VENTRAL HERNIA;  Surgeon: Adin Hector, MD;  Location: [facility];  Service: General;  Laterality: Right;   Family History  Problem Relation Age of Onset  . Heart disease Mother   . Cancer Father     lung   History  Substance Use  Topics  . Smoking status: Never Smoker   . Smokeless tobacco: Never Used  . Alcohol Use: No   OB History   Grav Para Term Preterm Abortions TAB SAB Ect Mult Living                 Review of Systems  Respiratory: Negative for shortness of breath.   Gastrointestinal: Negative for abdominal pain.  Neurological: Negative for headaches.  All other systems reviewed and are negative.     Allergies  Tetanus toxoids and Yellow dyes (non-tartrazine)  Home Medications   Prior to Admission medications   Medication Sig Start Date End Date Taking? Authorizing Provider  ALPRAZolam Duanne Moron) 1 MG tablet Take 1 mg by mouth 4 (four) times daily. As needed    Historical Provider, MD  atenolol (TENORMIN) 50 MG tablet Take 50 mg by mouth daily with breakfast.     Historical Provider, MD  busPIRone (BUSPAR) 15 MG tablet Take 15 mg by mouth daily with breakfast.     Historical Provider, MD  cyclobenzaprine (FLEXERIL) 10 MG tablet Take 10 mg by mouth every 8 (eight) hours as needed. For muscle spasms.    Historical Provider, MD  esomeprazole (NEXIUM) 40 MG capsule Take 40 mg by mouth daily before breakfast.    Historical Provider, MD  fenofibrate (  TRICOR) 145 MG tablet Take 145 mg by mouth daily with breakfast.     Historical Provider, MD  furosemide (LASIX) 20 MG tablet Take 20 mg by mouth daily with breakfast.     Historical Provider, MD  hydrochlorothiazide (HYDRODIURIL) 25 MG tablet Take 25 mg by mouth daily with breakfast.     Historical Provider, MD  hyoscyamine (LEVBID) 0.375 MG 12 hr tablet Take 0.375 mg by mouth every 12 (twelve) hours as needed. For stomach cramps    Historical Provider, MD  levothyroxine (SYNTHROID, LEVOTHROID) 112 MCG tablet Take 112 mcg by mouth daily with breakfast.     Historical Provider, MD  lidocaine (LIDODERM) 5 % Place 1-3 patches onto the skin daily. Remove & Discard patch within 12 hours or as directed by MD, as needed per pt    Historical Provider, MD  oxyCODONE  (ROXICODONE) 15 MG immediate release tablet Take 15-30 mg by mouth every 6 (six) hours as needed. For pain.    Historical Provider, MD  penicillin v potassium (VEETID) 500 MG tablet Take 500 mg by mouth 4 (four) times daily. Patient started on 06/05/12 .  Patient is to take every 6 hours for a total of 30 pills per patient.    Historical Provider, MD  potassium chloride SA (K-DUR,KLOR-CON) 20 MEQ tablet Take 20 mEq by mouth 2 (two) times daily.    Historical Provider, MD  venlafaxine XR (EFFEXOR-XR) 75 MG 24 hr capsule Take 75 mg by mouth 2 (two) times daily.    Historical Provider, MD  zolpidem (AMBIEN) 10 MG tablet Take 20 mg by mouth at bedtime.     Historical Provider, MD   BP 130/58  Pulse 64  Temp(Src) 98.6 F (37 C) (Oral)  Resp 18  SpO2 96% Physical Exam  Nursing note and vitals reviewed. Constitutional: She is oriented to person, place, and time. She appears well-developed and well-nourished. No distress.  HENT:  Head: Normocephalic and atraumatic.  Neck: Normal range of motion. Neck supple.  Cardiovascular: Normal rate, regular rhythm and normal heart sounds.   No murmur heard. Pulmonary/Chest: Effort normal and breath sounds normal. No respiratory distress.  Musculoskeletal: Normal range of motion.  There is tenderness to palpation over the anterior aspect of the left shoulder and clavicle. The distal ulnar and radial pulses are intact. She is able to flex and extend all fingers.  Neurological: She is alert and oriented to person, place, and time.  Skin: Skin is warm and dry. She is not diaphoretic.    ED Course  Procedures (including critical care time) Labs Review Labs Reviewed - No data to display  Imaging Review No results found.   EKG Interpretation None      MDM   Final diagnoses:  None    Patient presents after a fall. Her x-rays reveal a comminuted distal clavicle fracture. I discussed the results of the x-rays with Dr. French Ana from orthopedics. His  recommendations are a sling-and-swathe and followup in orthopedics in 7-10 days. She will be discharged with pain medication and followup with orthopedics.    Veryl Speak, MD 06/18/14 [phone]

## 2014-06-25 DIAGNOSIS — M19019 Primary osteoarthritis, unspecified shoulder: Secondary | ICD-10-CM | POA: Diagnosis not present

## 2014-07-02 DIAGNOSIS — M25519 Pain in unspecified shoulder: Secondary | ICD-10-CM | POA: Diagnosis not present

## 2014-07-16 ENCOUNTER — Encounter (HOSPITAL_BASED_OUTPATIENT_CLINIC_OR_DEPARTMENT_OTHER): Payer: Self-pay | Admitting: *Deleted

## 2014-07-16 ENCOUNTER — Other Ambulatory Visit: Payer: Self-pay | Admitting: Physician Assistant

## 2014-07-16 DIAGNOSIS — M25519 Pain in unspecified shoulder: Secondary | ICD-10-CM | POA: Diagnosis not present

## 2014-07-16 NOTE — Progress Notes (Signed)
To come in for ekg-bmet-no cardiac problems-will come prepaired to stay -bring overnight bag-all meds-if ok will go home-

## 2014-07-17 ENCOUNTER — Encounter (HOSPITAL_BASED_OUTPATIENT_CLINIC_OR_DEPARTMENT_OTHER)
Admission: RE | Admit: 2014-07-17 | Discharge: 2014-07-17 | Disposition: A | Discharge status: Home / Self Care | Payer: Medicare Other | Source: Ambulatory Visit | Attending: Orthopedic Surgery | Admitting: Orthopedic Surgery

## 2014-07-17 ENCOUNTER — Other Ambulatory Visit: Payer: Self-pay

## 2014-07-17 DIAGNOSIS — I10 Essential (primary) hypertension: Secondary | ICD-10-CM | POA: Diagnosis not present

## 2014-07-17 DIAGNOSIS — W108XXA Fall (on) (from) other stairs and steps, initial encounter: Secondary | ICD-10-CM | POA: Diagnosis not present

## 2014-07-17 DIAGNOSIS — Y929 Unspecified place or not applicable: Secondary | ICD-10-CM | POA: Diagnosis not present

## 2014-07-17 DIAGNOSIS — S42009A Fracture of unspecified part of unspecified clavicle, initial encounter for closed fracture: Secondary | ICD-10-CM | POA: Diagnosis not present

## 2014-07-17 LAB — BASIC METABOLIC PANEL
Anion gap: 15 (ref 5–15)
BUN: 51 mg/dL — ABNORMAL HIGH (ref 6–23)
CALCIUM: 9.9 mg/dL (ref 8.4–10.5)
CO2: 28 mEq/L (ref 19–32)
Chloride: 99 mEq/L (ref 96–112)
Creatinine, Ser: 1.25 mg/dL — ABNORMAL HIGH (ref 0.50–1.10)
GFR, EST AFRICAN AMERICAN: 48 mL/min — AB (ref >90)
GFR, EST NON AFRICAN AMERICAN: 41 mL/min — AB (ref >90)
GLUCOSE: 97 mg/dL (ref 70–99)
POTASSIUM: 5.3 meq/L (ref 3.7–5.3)
SODIUM: 142 meq/L (ref 137–147)

## 2014-07-17 NOTE — H&P (Signed)
  Dayn [facility] [address] [place], [place] [address] [phone] A Division of [facility]  Ninetta Lights, M.D.   Robert A. Noemi Chapel, M.D.   Faythe Casa, M.D.   Johnny Bridge, M.D.   Almedia Balls, M.D. Ernesta Amble. Percell Miller, M.D.  Joseph Pierini, M.D.  Lanier Prude, M.D.    Verner Chol, M.D. Mary L. Fenton Malling, PA-C  Kirstin A. Shepperson, PA-C  Josh Malta, PA-C [place], [place]  RE: [name], [name]   [id number]      DOB: 1936-02-04 PROGRESS NOTE: 07-16-14  HPI: [name] [name] comes in for follow-up.  Markedly displaced left clavicle fracture.  On her film on the last visit in her sling, this looked reduced enough that I was hoping we could get by with conservative treatment.  Coming in for recheck today.  Her pain is under control but she feels like the deformity in her shoulder is getting worse.     History and general exam is outlined and included in the chart.    EXAMINATION: Specifically, she is obviously displaced this more with the medial shaft of her clavicle starting to lift up almost tenting her skin.  She is neurovascularly intact.  X-RAYS: Follow-up x-rays show again recurrent displacement back to where we started.   DISPOSITION: I spent 25 minutes going over everything, most of it face-to-face with [name] [name] and her husband.  Based on what I am seeing now, I do not think this is going to work out with conservative treatment.  I have discussed open reduction internal fixation with an anterior plate.  Procedure, risks, benefits and complications were reviewed in detail.  Paperwork completed and all questions answered.  She and her husband understand and agree.  I will see her at the time of operative intervention.     Ninetta Lights, M.D.  Electronically verified by Ninetta Lights, M.D.  DFM:gde D 07-16-14 T 07-17-14

## 2014-07-17 NOTE — H&P (Signed)
  [name] [name]/[facility] [address], [place] [address] [phone] A Division of [facility]  Ninetta Lights, M.D.   Robert A. Noemi Chapel, M.D.   Faythe Casa, M.D.   Johnny Bridge, M.D.   Almedia Balls, M.D Ernesta Amble. Percell Miller, M.D.  Joseph Pierini, M.D.  Lanier Prude, M.D.    Verner Chol, M.D. Mary L. Fenton Malling, PA-C  Kirstin A. Shepperson, PA-C  Josh Exeter, PA-C [place], [place]   RE: Chann[place], [place]   [id number]      DOB: 11-08-1936 PROGRESS NOTE: 06-25-14 [name] is a 78 year old who presents with left clavicle pain. One week ago she fell going down a set of stairs landing on her left shoulder. She was seen at [facility] where x-rays showed a type I distal clavicle fracture. She had a left total shoulder replacement  in 6/08. She is seen today for follow-up. She is in a sling. She admits to a fair amount of pain over the left clavicle.  She has pain with left shoulder range of motion. She is not taking any pain medications for this.  Past medical, family, social history is detailed on the chart. Review of systems detailed in HPI all others are reviewed and are negative.   EXAMINATION: Well-developed well-nourished female in no acute distress. Alert and oriented x3. Exam of the left clavicle reveals tenderness to palpation over the distal clavicle. She has ecchymosis around the clavicle. There is obvious deformity noted. She is neurovascularly intact distally.  X-RAYS: X-rays from [facility] reviewed show type I distal clavicle fracture.   IMPRESSION: Type I distal clavicle fracture on the left.  PLAN: She will continue in the sling for a week. She will follow-up in a week when swelling and bruising has subsided for repeat x-rays. If she improves we will try to hold off from surgery.   Ninetta Lights, M.D.  Electronically verified by Ninetta Lights, M.D. DFM(MLA):kah D 06-25-14 T  06-26-14

## 2014-07-18 ENCOUNTER — Ambulatory Visit (HOSPITAL_BASED_OUTPATIENT_CLINIC_OR_DEPARTMENT_OTHER)
Admission: RE | Admit: 2014-07-18 | Discharge: 2014-07-18 | Disposition: A | Discharge status: Home / Self Care | Payer: Medicare Other | Source: Ambulatory Visit | Attending: Orthopedic Surgery | Admitting: Orthopedic Surgery

## 2014-07-18 ENCOUNTER — Encounter (HOSPITAL_BASED_OUTPATIENT_CLINIC_OR_DEPARTMENT_OTHER): Payer: Medicare Other | Admitting: Anesthesiology

## 2014-07-18 ENCOUNTER — Ambulatory Visit (HOSPITAL_BASED_OUTPATIENT_CLINIC_OR_DEPARTMENT_OTHER): Payer: Medicare Other | Admitting: Anesthesiology

## 2014-07-18 ENCOUNTER — Encounter (HOSPITAL_BASED_OUTPATIENT_CLINIC_OR_DEPARTMENT_OTHER)
Admission: RE | Disposition: A | Discharge status: Home / Self Care | Payer: Self-pay | Source: Ambulatory Visit | Attending: Orthopedic Surgery

## 2014-07-18 DIAGNOSIS — Y929 Unspecified place or not applicable: Secondary | ICD-10-CM | POA: Insufficient documentation

## 2014-07-18 DIAGNOSIS — S42009A Fracture of unspecified part of unspecified clavicle, initial encounter for closed fracture: Secondary | ICD-10-CM | POA: Diagnosis not present

## 2014-07-18 DIAGNOSIS — W108XXA Fall (on) (from) other stairs and steps, initial encounter: Secondary | ICD-10-CM | POA: Insufficient documentation

## 2014-07-18 DIAGNOSIS — S42023A Displaced fracture of shaft of unspecified clavicle, initial encounter for closed fracture: Secondary | ICD-10-CM | POA: Diagnosis not present

## 2014-07-18 DIAGNOSIS — I10 Essential (primary) hypertension: Secondary | ICD-10-CM | POA: Diagnosis not present

## 2014-07-18 HISTORY — PX: ORIF CLAVICULAR FRACTURE: SHX5055

## 2014-07-18 LAB — POCT HEMOGLOBIN-HEMACUE: Hemoglobin: 12.8 g/dL (ref 12.0–15.0)

## 2014-07-18 SURGERY — OPEN REDUCTION INTERNAL FIXATION (ORIF) CLAVICULAR FRACTURE
Anesthesia: General | Site: Shoulder | Laterality: Left

## 2014-07-18 MED ORDER — FENTANYL CITRATE 0.05 MG/ML IJ SOLN
25.0000 ug | INTRAMUSCULAR | Status: DC | PRN
  Administered 2014-07-18 (×2): 25 ug via INTRAVENOUS

## 2014-07-18 MED ORDER — CHLORHEXIDINE GLUCONATE 4 % EX LIQD: 60.0000 mL | Freq: Once | CUTANEOUS | Status: DC

## 2014-07-18 MED ORDER — LACTATED RINGERS IV SOLN
INTRAVENOUS | Status: DC
  Administered 2014-07-18: 13:00:00 via INTRAVENOUS

## 2014-07-18 MED ORDER — FENTANYL CITRATE 0.05 MG/ML IJ SOLN: 50.0000 ug | INTRAMUSCULAR | Status: DC | PRN

## 2014-07-18 MED ORDER — CEFAZOLIN SODIUM-DEXTROSE 2-3 GM-% IV SOLR
INTRAVENOUS | Status: AC
  Filled 2014-07-18: qty 50

## 2014-07-18 MED ORDER — DEXAMETHASONE SODIUM PHOSPHATE 4 MG/ML IJ SOLN
INTRAMUSCULAR | Status: DC | PRN
  Administered 2014-07-18: 10 mg via INTRAVENOUS

## 2014-07-18 MED ORDER — CEFAZOLIN SODIUM-DEXTROSE 2-3 GM-% IV SOLR
2.0000 g | INTRAVENOUS | Status: AC
  Administered 2014-07-18: 2 mg via INTRAVENOUS

## 2014-07-18 MED ORDER — ONDANSETRON HCL 4 MG/2ML IJ SOLN
INTRAMUSCULAR | Status: DC | PRN
  Administered 2014-07-18: 4 mg via INTRAVENOUS

## 2014-07-18 MED ORDER — METOCLOPRAMIDE HCL 5 MG/ML IJ SOLN: 5.0000 mg | Freq: Three times a day (TID) | INTRAMUSCULAR | Status: DC | PRN

## 2014-07-18 MED ORDER — HYDROMORPHONE HCL PF 1 MG/ML IJ SOLN: 0.5000 mg | INTRAMUSCULAR | Status: DC | PRN

## 2014-07-18 MED ORDER — ONDANSETRON HCL 4 MG/2ML IJ SOLN: 4.0000 mg | Freq: Four times a day (QID) | INTRAMUSCULAR | Status: DC | PRN

## 2014-07-18 MED ORDER — MIDAZOLAM HCL 2 MG/2ML IJ SOLN
INTRAMUSCULAR | Status: AC
  Filled 2014-07-18: qty 2

## 2014-07-18 MED ORDER — GLYCOPYRROLATE 0.2 MG/ML IJ SOLN
INTRAMUSCULAR | Status: DC | PRN
  Administered 2014-07-18: 0.2 mg via INTRAVENOUS

## 2014-07-18 MED ORDER — OXYCODONE-ACETAMINOPHEN 5-325 MG PO TABS: 1.0000 | ORAL_TABLET | ORAL | Status: DC | PRN

## 2014-07-18 MED ORDER — FENTANYL CITRATE 0.05 MG/ML IJ SOLN
INTRAMUSCULAR | Status: AC
  Filled 2014-07-18: qty 2

## 2014-07-18 MED ORDER — EPHEDRINE SULFATE 50 MG/ML IJ SOLN
INTRAMUSCULAR | Status: DC | PRN
  Administered 2014-07-18: 10 mg via INTRAVENOUS

## 2014-07-18 MED ORDER — BUPIVACAINE HCL (PF) 0.5 % IJ SOLN
INTRAMUSCULAR | Status: DC | PRN
  Administered 2014-07-18: 20 mL

## 2014-07-18 MED ORDER — LACTATED RINGERS IV SOLN: INTRAVENOUS | Status: DC

## 2014-07-18 MED ORDER — ONDANSETRON HCL 4 MG PO TABS: 4.0000 mg | ORAL_TABLET | Freq: Three times a day (TID) | ORAL | Status: DC | PRN

## 2014-07-18 MED ORDER — KETOROLAC TROMETHAMINE 30 MG/ML IJ SOLN
INTRAMUSCULAR | Status: AC
  Filled 2014-07-18: qty 1

## 2014-07-18 MED ORDER — BUPIVACAINE HCL (PF) 0.5 % IJ SOLN
INTRAMUSCULAR | Status: AC
  Filled 2014-07-18: qty 30

## 2014-07-18 MED ORDER — KETOROLAC TROMETHAMINE 30 MG/ML IJ SOLN
15.0000 mg | Freq: Once | INTRAMUSCULAR | Status: AC | PRN
  Administered 2014-07-18: 30 mg via INTRAVENOUS

## 2014-07-18 MED ORDER — METOCLOPRAMIDE HCL 5 MG PO TABS: 5.0000 mg | ORAL_TABLET | Freq: Three times a day (TID) | ORAL | Status: DC | PRN

## 2014-07-18 MED ORDER — FENTANYL CITRATE 0.05 MG/ML IJ SOLN
INTRAMUSCULAR | Status: AC
  Filled 2014-07-18: qty 4

## 2014-07-18 MED ORDER — HYDROMORPHONE HCL PF 1 MG/ML IJ SOLN
INTRAMUSCULAR | Status: AC
  Filled 2014-07-18: qty 1

## 2014-07-18 MED ORDER — OXYCODONE HCL 5 MG PO TABS
ORAL_TABLET | ORAL | Status: AC
  Filled 2014-07-18: qty 1

## 2014-07-18 MED ORDER — PROPOFOL 10 MG/ML IV BOLUS
INTRAVENOUS | Status: DC | PRN
  Administered 2014-07-18: 150 mg via INTRAVENOUS

## 2014-07-18 MED ORDER — MIDAZOLAM HCL 2 MG/2ML IJ SOLN: 1.0000 mg | INTRAMUSCULAR | Status: DC | PRN

## 2014-07-18 MED ORDER — FENTANYL CITRATE 0.05 MG/ML IJ SOLN
INTRAMUSCULAR | Status: DC | PRN
  Administered 2014-07-18 (×3): 50 ug via INTRAVENOUS
  Administered 2014-07-18 (×2): 25 ug via INTRAVENOUS
  Administered 2014-07-18 (×2): 50 ug via INTRAVENOUS

## 2014-07-18 MED ORDER — SUCCINYLCHOLINE CHLORIDE 20 MG/ML IJ SOLN
INTRAMUSCULAR | Status: DC | PRN
  Administered 2014-07-18: 90 mg via INTRAVENOUS

## 2014-07-18 MED ORDER — LIDOCAINE HCL (CARDIAC) 20 MG/ML IV SOLN
INTRAVENOUS | Status: DC | PRN
  Administered 2014-07-18: 50 mg via INTRAVENOUS

## 2014-07-18 MED ORDER — OXYCODONE HCL 5 MG PO TABS
5.0000 mg | ORAL_TABLET | Freq: Once | ORAL | Status: AC
  Administered 2014-07-18: 5 mg via ORAL

## 2014-07-18 MED ORDER — ONDANSETRON HCL 4 MG/2ML IJ SOLN: 4.0000 mg | Freq: Once | INTRAMUSCULAR | Status: DC | PRN

## 2014-07-18 MED ORDER — ONDANSETRON HCL 4 MG PO TABS: 4.0000 mg | ORAL_TABLET | Freq: Four times a day (QID) | ORAL | Status: DC | PRN

## 2014-07-18 SURGICAL SUPPLY — 60 items
APL SKNCLS STERI-STRIP NONHPOA (GAUZE/BANDAGES/DRESSINGS)
BENZOIN TINCTURE PRP APPL 2/3 (GAUZE/BANDAGES/DRESSINGS) IMPLANT
BIT DRILL 2.8X5 QR DISP (BIT) ×2 IMPLANT
BIT DRILL QUICK RELEASE 3.5MM (BIT) IMPLANT
BLADE CLIPPER SURG (BLADE) IMPLANT
BLADE SURG 15 STRL LF DISP TIS (BLADE) ×1 IMPLANT
BLADE SURG 15 STRL SS (BLADE) ×3
CLOSURE WOUND 1/2 X4 (GAUZE/BANDAGES/DRESSINGS) ×1
DECANTER SPIKE VIAL GLASS SM (MISCELLANEOUS) IMPLANT
DRAPE U-SHAPE 47X51 STRL (DRAPES) ×3 IMPLANT
DRAPE U-SHAPE 76X120 STRL (DRAPES) ×6 IMPLANT
DRILL QUICK RELEASE 3.5MM (BIT) ×3
DRSG TEGADERM 2-3/8X2-3/4 SM (GAUZE/BANDAGES/DRESSINGS) ×2 IMPLANT
DURAPREP 26ML APPLICATOR (WOUND CARE) ×3 IMPLANT
ELECT REM PT RETURN 9FT ADLT (ELECTROSURGICAL) ×3
ELECTRODE REM PT RTRN 9FT ADLT (ELECTROSURGICAL) ×1 IMPLANT
GAUZE SPONGE 4X4 12PLY STRL (GAUZE/BANDAGES/DRESSINGS) ×3 IMPLANT
GAUZE XEROFORM 1X8 LF (GAUZE/BANDAGES/DRESSINGS) ×3 IMPLANT
GLOVE BIOGEL PI IND STRL 7.0 (GLOVE) ×1 IMPLANT
GLOVE BIOGEL PI INDICATOR 7.0 (GLOVE) ×4
GLOVE ECLIPSE 6.5 STRL STRAW (GLOVE) ×5 IMPLANT
GLOVE EXAM NITRILE LRG STRL (GLOVE) ×2 IMPLANT
GLOVE ORTHO TXT STRL SZ7.5 (GLOVE) ×3 IMPLANT
GOWN STRL REUS W/ TWL LRG LVL3 (GOWN DISPOSABLE) ×2 IMPLANT
GOWN STRL REUS W/ TWL XL LVL3 (GOWN DISPOSABLE) ×1 IMPLANT
GOWN STRL REUS W/TWL LRG LVL3 (GOWN DISPOSABLE) ×3
GOWN STRL REUS W/TWL XL LVL3 (GOWN DISPOSABLE) ×7 IMPLANT
NS IRRIG 1000ML POUR BTL (IV SOLUTION) ×3 IMPLANT
PACK ARTHROSCOPY DSU (CUSTOM PROCEDURE TRAY) ×3 IMPLANT
PACK BASIN DAY SURGERY FS (CUSTOM PROCEDURE TRAY) ×3 IMPLANT
PENCIL BUTTON HOLSTER BLD 10FT (ELECTRODE) ×3 IMPLANT
PLATE CLAVICLE ANTERIOR 6HOLE (Plate) ×2 IMPLANT
SCREW HEXALOBE NON-LOCK 3.5X14 (Screw) ×2 IMPLANT
SCREW HEXALOBE NON-LOCK 3.5X16 (Screw) ×6 IMPLANT
SCREW NONLOCK HEX 3.5X12 (Screw) ×4 IMPLANT
SLEEVE SCD COMPRESS KNEE MED (MISCELLANEOUS) ×2 IMPLANT
SLING ARM IMMOBILIZER LRG (SOFTGOODS) IMPLANT
SLING ARM IMMOBILIZER MED (SOFTGOODS) IMPLANT
SLING ARM LRG ADULT FOAM STRAP (SOFTGOODS) ×2 IMPLANT
SLING ARM MED ADULT FOAM STRAP (SOFTGOODS) IMPLANT
SLING ARM XL FOAM STRAP (SOFTGOODS) IMPLANT
SPONGE LAP 18X18 X RAY DECT (DISPOSABLE) ×4 IMPLANT
STAPLER VISISTAT 35W (STAPLE) IMPLANT
STRIP CLOSURE SKIN 1/2X4 (GAUZE/BANDAGES/DRESSINGS) ×1 IMPLANT
SUCTION FRAZIER TIP 10 FR DISP (SUCTIONS) IMPLANT
SUT ETHILON 3 0 PS 1 (SUTURE) IMPLANT
SUT FIBERWIRE #2 38 T-5 BLUE (SUTURE) ×9
SUT MNCRL AB 4-0 PS2 18 (SUTURE) ×2 IMPLANT
SUT RETRIEVER MED (INSTRUMENTS) IMPLANT
SUT VIC AB 0 CT1 27 (SUTURE) ×3
SUT VIC AB 0 CT1 27XBRD ANBCTR (SUTURE) IMPLANT
SUT VIC AB 2-0 SH 27 (SUTURE)
SUT VIC AB 2-0 SH 27XBRD (SUTURE) IMPLANT
SUT VIC AB 3-0 SH 27 (SUTURE) ×3
SUT VIC AB 3-0 SH 27X BRD (SUTURE) IMPLANT
SUTURE FIBERWR #2 38 T-5 BLUE (SUTURE) ×3 IMPLANT
SYR BULB 3OZ (MISCELLANEOUS) ×3 IMPLANT
TOWEL OR 17X24 6PK STRL BLUE (TOWEL DISPOSABLE) ×3 IMPLANT
TOWEL OR NON WOVEN STRL DISP B (DISPOSABLE) ×2 IMPLANT
YANKAUER SUCT BULB TIP NO VENT (SUCTIONS) ×3 IMPLANT

## 2014-07-18 NOTE — Anesthesia Preprocedure Evaluation (Signed)
Anesthesia Evaluation  Patient identified by MRN, date of birth, ID band Patient awake    Reviewed: Allergy & Precautions, H&P , NPO status , Patient's Chart, lab work & pertinent test results  Airway Mallampati: II TM Distance: >3 FB Neck ROM: Full    Dental  (+) Teeth Intact   Pulmonary  breath sounds clear to auscultation        Cardiovascular hypertension, Rhythm:Regular Rate:Normal     Neuro/Psych    GI/Hepatic   Endo/Other    Renal/GU      Musculoskeletal   Abdominal   Peds  Hematology   Anesthesia Other Findings   Reproductive/Obstetrics                           Anesthesia Physical Anesthesia Plan  ASA: II  Anesthesia Plan: General   Post-op Pain Management:    Induction: Intravenous  Airway Management Planned: Oral ETT  Additional Equipment:   Intra-op Plan:   Post-operative Plan: Extubation in OR  Informed Consent: I have reviewed the patients History and Physical, chart, labs and discussed the procedure including the risks, benefits and alternatives for the proposed anesthesia with the patient or authorized representative who has indicated his/her understanding and acceptance.   Dental advisory given  Plan Discussed with: CRNA and Anesthesiologist  Anesthesia Plan Comments:         Anesthesia Quick Evaluation

## 2014-07-18 NOTE — Anesthesia Procedure Notes (Signed)
Procedure Name: Intubation Performed by: Terrance Mass Pre-anesthesia Checklist: Patient identified, Timeout performed, Emergency Drugs available, Suction available and Patient being monitored Patient Re-evaluated:Patient Re-evaluated prior to inductionOxygen Delivery Method: Circle system utilized Preoxygenation: Pre-oxygenation with 100% oxygen Intubation Type: IV induction Ventilation: Mask ventilation without difficulty Laryngoscope Size: Miller and 2 Grade View: Grade I Tube type: Oral Tube size: 7.0 mm Number of attempts: 1 Airway Equipment and Method: Stylet Secured at: 22 cm Tube secured with: Tape Dental Injury: Teeth and Oropharynx as per pre-operative assessment

## 2014-07-18 NOTE — Interval H&P Note (Signed)
History and Physical Interval Note:  07/18/2014 7:32 AM  [name] [name]  has presented today for surgery, with the diagnosis of LEFT CLAVICLE FRACTURE CLOSED   The various methods of treatment have been discussed with the patient and family. After consideration of risks, benefits and other options for treatment, the patient has consented to  Procedure(s): OPEN REDUCTION INTERNAL FIXATION (ORIF) LEFT CLAVICULAR FRACTURE (Left) as a surgical intervention .  The patient's history has been reviewed, patient examined, no change in status, stable for surgery.  I have reviewed the patient's chart and labs.  Questions were answered to the patient's satisfaction.     [name]

## 2014-07-18 NOTE — Discharge Instructions (Signed)
ORIF Clavicle Fracture   Wear sling until follow up appointment.  Change dressing daily starting in 3 days.  May shower in 3 days, but do not soak incision.  May apply ice for up to 20 minutes at a time for pain and swelling.  Follow up appointment in one week.  SEEK IMMEDIATE MEDICAL CARE IF:   You develop increased redness, swelling, or pain around your incision sites.  There is pus or any unusual drainage coming from your incision sites.  You develop a fever.  You notice a bad smell coming from your incision sites.  Any of your incisions break open (edges do not stay together) after sutures or staples have been removed. Document Released: 06/18/2005 Document Revised: 04/15/2014 Document Reviewed: 04/23/2012 [facility] Patient Information 2015 [place], [place]. This information is not intended to replace advice given to you by your health care provider. Make sure you discuss any questions you have with your health care provider.   Post Anesthesia Home Care Instructions  Activity: Get plenty of rest for the remainder of the day. A responsible adult should stay with you for 24 hours following the procedure.  For the next 24 hours, DO NOT: -Drive a car -Paediatric nurse -Drink alcoholic beverages -Take any medication unless instructed by your physician -Make any legal decisions or sign important papers.  Meals: Start with liquid foods such as gelatin or soup. Progress to regular foods as tolerated. Avoid greasy, spicy, heavy foods. If nausea and/or vomiting occur, drink only clear liquids until the nausea and/or vomiting subsides. Call your physician if vomiting continues.  Special Instructions/Symptoms: Your throat may feel dry or sore from the anesthesia or the breathing tube placed in your throat during surgery. If this causes discomfort, gargle with warm salt water. The discomfort should disappear within 24 hours.

## 2014-07-18 NOTE — Transfer of Care (Signed)
Immediate Anesthesia Transfer of Care Note  Patient: [name] [name]  Procedure(s) Performed: Procedure(s): OPEN REDUCTION INTERNAL FIXATION (ORIF) LEFT CLAVICULAR FRACTURE (Left)  Patient Location: PACU  Anesthesia Type:General  Level of Consciousness: awake, alert  and oriented  Airway & Oxygen Therapy: Patient Spontanous Breathing and Patient connected to face mask oxygen  Post-op Assessment: Report given to PACU RN and Post -op Vital signs reviewed and stable  Post vital signs: Reviewed and stable  Complications: No apparent anesthesia complications

## 2014-07-19 ENCOUNTER — Encounter (HOSPITAL_BASED_OUTPATIENT_CLINIC_OR_DEPARTMENT_OTHER): Payer: Self-pay | Admitting: Orthopedic Surgery

## 2014-07-19 NOTE — Op Note (Signed)
[name], [name] NO.:  [id number]  MEDICAL RECORD NO.:  [id number]  LOCATION:                                 FACILITY:  PHYSICIAN:  Ninetta Lights, M.D. DATE OF BIRTH:  08-Mar-1936  DATE OF PROCEDURE:  07/18/2014 DATE OF DISCHARGE:  07/18/2014                              OPERATIVE REPORT   PREOPERATIVE DIAGNOSIS:  Comminuted three-part closed left clavicle fracture.  POSTOPERATIVE DIAGNOSIS:  Comminuted three-part closed left clavicle fracture.  PROCEDURE:  Open reduction and internal fixation with an anterior based 6-hole pre shaped Acumed plate and screws.  SURGEON:  Ninetta Lights, MD  ASSISTANT:  Doran Stabler, PA, present throughout the entire case and necessary for timely completion of procedure.  ANESTHESIA:  General.  BLOOD LOSS:  Minimal.  SPECIMENS:  None.  CULTURES:  None.  COMPLICATIONS:  None.  DRESSINGS:  Soft compressive sling.  DESCRIPTION OF PROCEDURE:  The patient was brought to the operating room and after adequate anesthesia had been obtained, placed in a beach-chair position on the shoulder positioner, prepped and draped in usual sterile fashion.  Anterior incision.  Skin and subcutaneous tissue divided. Subperiosteal exposure of the markedly displaced fracture.  A large medial fragment elevated superiorly.  The lateral fragment had a triangular butterfly piece displaced inferiorly and anteriorly.  All of this reduced anatomically, held with clamp.  Applying an anterior plate. Three screws on the medial side, two on the lateral side, and then the last screw bridging the inner fragmentary comminuted butterfly fragment lag front to back onto  the plate.  At completion, nice solid stable fixation and anatomic alignment.  Wound irrigated.  The fascia oversewn in deltopectoral fashion with Vicryl over top of the repair.  Subcutaneous and subcuticular closure.  Margins were injected with Marcaine.  Sterile compressive  dressing applied. Anesthesia reversed.  Brought to the recovery room.  Tolerated the surgery well.  No complications.     Ninetta Lights, M.D.     DFM/MEDQ  D:  07/18/2014  T:  07/18/2014  Job:  [id number]

## 2014-07-19 NOTE — Anesthesia Postprocedure Evaluation (Signed)
  Anesthesia Post-op Note  Patient: [name] [name]  Procedure(s) Performed: Procedure(s): OPEN REDUCTION INTERNAL FIXATION (ORIF) LEFT CLAVICULAR FRACTURE (Left)  Patient Location: PACU  Anesthesia Type:General  Level of Consciousness: awake, alert  and oriented  Airway and Oxygen Therapy: Patient Spontanous Breathing and Patient connected to nasal cannula oxygen  Post-op Pain: mild  Post-op Assessment: Post-op Vital signs reviewed, Patient's Cardiovascular Status Stable, Respiratory Function Stable, Patent Airway and Pain level controlled  Post-op Vital Signs: stable  Last Vitals:  Filed Vitals:   07/18/14 1605  BP:   Pulse: 68  Temp: 36.9 C  Resp: 18    Complications: No apparent anesthesia complications

## 2014-08-23 ENCOUNTER — Emergency Department (HOSPITAL_COMMUNITY): Payer: Medicare Other

## 2014-08-23 ENCOUNTER — Inpatient Hospital Stay (HOSPITAL_COMMUNITY)
Admission: EM | Admit: 2014-08-23 | Discharge: 2014-08-28 | DRG: 444 | Disposition: A | Discharge status: Home Health | Payer: Medicare Other | Attending: Internal Medicine | Admitting: Internal Medicine

## 2014-08-23 ENCOUNTER — Encounter (HOSPITAL_COMMUNITY): Payer: Self-pay | Admitting: Emergency Medicine

## 2014-08-23 DIAGNOSIS — N179 Acute kidney failure, unspecified: Secondary | ICD-10-CM | POA: Diagnosis not present

## 2014-08-23 DIAGNOSIS — Z9089 Acquired absence of other organs: Secondary | ICD-10-CM

## 2014-08-23 DIAGNOSIS — R17 Unspecified jaundice: Secondary | ICD-10-CM

## 2014-08-23 DIAGNOSIS — K8050 Calculus of bile duct without cholangitis or cholecystitis without obstruction: Secondary | ICD-10-CM | POA: Diagnosis not present

## 2014-08-23 DIAGNOSIS — E43 Unspecified severe protein-calorie malnutrition: Secondary | ICD-10-CM | POA: Diagnosis present

## 2014-08-23 DIAGNOSIS — A0472 Enterocolitis due to Clostridium difficile, not specified as recurrent: Secondary | ICD-10-CM

## 2014-08-23 DIAGNOSIS — R630 Anorexia: Secondary | ICD-10-CM | POA: Diagnosis present

## 2014-08-23 DIAGNOSIS — R791 Abnormal coagulation profile: Secondary | ICD-10-CM | POA: Diagnosis present

## 2014-08-23 DIAGNOSIS — E46 Unspecified protein-calorie malnutrition: Secondary | ICD-10-CM | POA: Insufficient documentation

## 2014-08-23 DIAGNOSIS — K219 Gastro-esophageal reflux disease without esophagitis: Secondary | ICD-10-CM | POA: Diagnosis present

## 2014-08-23 DIAGNOSIS — G8929 Other chronic pain: Secondary | ICD-10-CM | POA: Diagnosis present

## 2014-08-23 DIAGNOSIS — M1990 Unspecified osteoarthritis, unspecified site: Secondary | ICD-10-CM

## 2014-08-23 DIAGNOSIS — R197 Diarrhea, unspecified: Secondary | ICD-10-CM | POA: Diagnosis not present

## 2014-08-23 DIAGNOSIS — I10 Essential (primary) hypertension: Secondary | ICD-10-CM | POA: Diagnosis not present

## 2014-08-23 DIAGNOSIS — Z9181 History of falling: Secondary | ICD-10-CM

## 2014-08-23 DIAGNOSIS — E785 Hyperlipidemia, unspecified: Secondary | ICD-10-CM | POA: Diagnosis present

## 2014-08-23 DIAGNOSIS — F419 Anxiety disorder, unspecified: Secondary | ICD-10-CM

## 2014-08-23 DIAGNOSIS — K869 Disease of pancreas, unspecified: Secondary | ICD-10-CM | POA: Diagnosis present

## 2014-08-23 DIAGNOSIS — R0602 Shortness of breath: Secondary | ICD-10-CM | POA: Diagnosis not present

## 2014-08-23 DIAGNOSIS — K838 Other specified diseases of biliary tract: Secondary | ICD-10-CM | POA: Diagnosis not present

## 2014-08-23 DIAGNOSIS — R634 Abnormal weight loss: Secondary | ICD-10-CM | POA: Diagnosis present

## 2014-08-23 DIAGNOSIS — K831 Obstruction of bile duct: Secondary | ICD-10-CM | POA: Diagnosis present

## 2014-08-23 DIAGNOSIS — F411 Generalized anxiety disorder: Secondary | ICD-10-CM | POA: Diagnosis present

## 2014-08-23 DIAGNOSIS — R296 Repeated falls: Secondary | ICD-10-CM

## 2014-08-23 DIAGNOSIS — Z96619 Presence of unspecified artificial shoulder joint: Secondary | ICD-10-CM | POA: Diagnosis not present

## 2014-08-23 DIAGNOSIS — E039 Hypothyroidism, unspecified: Secondary | ICD-10-CM | POA: Diagnosis present

## 2014-08-23 DIAGNOSIS — E876 Hypokalemia: Secondary | ICD-10-CM | POA: Diagnosis present

## 2014-08-23 DIAGNOSIS — M47812 Spondylosis without myelopathy or radiculopathy, cervical region: Secondary | ICD-10-CM | POA: Diagnosis not present

## 2014-08-23 DIAGNOSIS — S0993XA Unspecified injury of face, initial encounter: Secondary | ICD-10-CM | POA: Diagnosis not present

## 2014-08-23 DIAGNOSIS — S0990XA Unspecified injury of head, initial encounter: Secondary | ICD-10-CM | POA: Diagnosis not present

## 2014-08-23 DIAGNOSIS — D689 Coagulation defect, unspecified: Secondary | ICD-10-CM | POA: Diagnosis not present

## 2014-08-23 DIAGNOSIS — R5383 Other fatigue: Secondary | ICD-10-CM | POA: Diagnosis not present

## 2014-08-23 DIAGNOSIS — M545 Low back pain, unspecified: Secondary | ICD-10-CM | POA: Diagnosis present

## 2014-08-23 DIAGNOSIS — K8309 Other cholangitis: Principal | ICD-10-CM | POA: Diagnosis present

## 2014-08-23 DIAGNOSIS — K458 Other specified abdominal hernia without obstruction or gangrene: Secondary | ICD-10-CM

## 2014-08-23 DIAGNOSIS — R404 Transient alteration of awareness: Secondary | ICD-10-CM | POA: Diagnosis not present

## 2014-08-23 DIAGNOSIS — S199XXA Unspecified injury of neck, initial encounter: Secondary | ICD-10-CM | POA: Diagnosis not present

## 2014-08-23 DIAGNOSIS — K589 Irritable bowel syndrome without diarrhea: Secondary | ICD-10-CM | POA: Diagnosis present

## 2014-08-23 DIAGNOSIS — R5381 Other malaise: Secondary | ICD-10-CM | POA: Diagnosis not present

## 2014-08-23 DIAGNOSIS — J40 Bronchitis, not specified as acute or chronic: Secondary | ICD-10-CM | POA: Diagnosis not present

## 2014-08-23 DIAGNOSIS — R932 Abnormal findings on diagnostic imaging of liver and biliary tract: Secondary | ICD-10-CM | POA: Diagnosis not present

## 2014-08-23 DIAGNOSIS — D376 Neoplasm of uncertain behavior of liver, gallbladder and bile ducts: Secondary | ICD-10-CM | POA: Diagnosis not present

## 2014-08-23 DIAGNOSIS — N281 Cyst of kidney, acquired: Secondary | ICD-10-CM | POA: Diagnosis not present

## 2014-08-23 DIAGNOSIS — K5730 Diverticulosis of large intestine without perforation or abscess without bleeding: Secondary | ICD-10-CM | POA: Diagnosis not present

## 2014-08-23 DIAGNOSIS — R1011 Right upper quadrant pain: Secondary | ICD-10-CM | POA: Diagnosis not present

## 2014-08-23 DIAGNOSIS — R935 Abnormal findings on diagnostic imaging of other abdominal regions, including retroperitoneum: Secondary | ICD-10-CM | POA: Diagnosis not present

## 2014-08-23 HISTORY — DX: Acute kidney failure, unspecified: N17.9

## 2014-08-23 LAB — LIPASE, BLOOD: Lipase: 810 U/L — ABNORMAL HIGH (ref 11–59)

## 2014-08-23 LAB — HEPATIC FUNCTION PANEL
ALBUMIN: 2.2 g/dL — AB (ref 3.5–5.2)
ALT: 56 U/L — ABNORMAL HIGH (ref 0–35)
AST: 89 U/L — AB (ref 0–37)
Alkaline Phosphatase: 342 U/L — ABNORMAL HIGH (ref 39–117)
BILIRUBIN TOTAL: 9.2 mg/dL — AB (ref 0.3–1.2)
Bilirubin, Direct: 7.5 mg/dL — ABNORMAL HIGH (ref 0.0–0.3)
Indirect Bilirubin: 1.7 mg/dL — ABNORMAL HIGH (ref 0.3–0.9)
TOTAL PROTEIN: 6.7 g/dL (ref 6.0–8.3)

## 2014-08-23 LAB — CBC WITH DIFFERENTIAL/PLATELET
Basophils Absolute: 0 10*3/uL (ref 0.0–0.1)
Basophils Relative: 0 % (ref 0–1)
EOS ABS: 0.3 10*3/uL (ref 0.0–0.7)
Eosinophils Relative: 2 % (ref 0–5)
HCT: 32.4 % — ABNORMAL LOW (ref 36.0–46.0)
Hemoglobin: 10.9 g/dL — ABNORMAL LOW (ref 12.0–15.0)
LYMPHS ABS: 1.3 10*3/uL (ref 0.7–4.0)
Lymphocytes Relative: 9 % — ABNORMAL LOW (ref 12–46)
MCH: 29.8 pg (ref 26.0–34.0)
MCHC: 33.6 g/dL (ref 30.0–36.0)
MCV: 88.5 fL (ref 78.0–100.0)
MONOS PCT: 10 % (ref 3–12)
Monocytes Absolute: 1.4 10*3/uL — ABNORMAL HIGH (ref 0.1–1.0)
NEUTROS ABS: 11.1 10*3/uL — AB (ref 1.7–7.7)
NEUTROS PCT: 79 % — AB (ref 43–77)
PLATELETS: 197 10*3/uL (ref 150–400)
RBC: 3.66 MIL/uL — AB (ref 3.87–5.11)
RDW: 14.3 % (ref 11.5–15.5)
WBC: 14.2 10*3/uL — ABNORMAL HIGH (ref 4.0–10.5)

## 2014-08-23 LAB — I-STAT TROPONIN, ED: Troponin i, poc: 0.01 ng/mL (ref 0.00–0.08)

## 2014-08-23 LAB — PROTIME-INR
INR: 1.87 — AB (ref 0.00–1.49)
Prothrombin Time: 21.5 seconds — ABNORMAL HIGH (ref 11.6–15.2)

## 2014-08-23 LAB — BASIC METABOLIC PANEL
Anion gap: 18 — ABNORMAL HIGH (ref 5–15)
BUN: 57 mg/dL — AB (ref 6–23)
CHLORIDE: 100 meq/L (ref 96–112)
CO2: 20 mEq/L (ref 19–32)
Calcium: 9 mg/dL (ref 8.4–10.5)
Creatinine, Ser: 1.81 mg/dL — ABNORMAL HIGH (ref 0.50–1.10)
GFR, EST AFRICAN AMERICAN: 30 mL/min — AB (ref >90)
GFR, EST NON AFRICAN AMERICAN: 26 mL/min — AB (ref >90)
GLUCOSE: 87 mg/dL (ref 70–99)
POTASSIUM: 3.1 meq/L — AB (ref 3.7–5.3)
SODIUM: 138 meq/L (ref 137–147)

## 2014-08-23 LAB — APTT: APTT: 42 s — AB (ref 24–37)

## 2014-08-23 LAB — ACETAMINOPHEN LEVEL: Acetaminophen (Tylenol), Serum: <15 ug/mL (ref 10–30)

## 2014-08-23 MED ORDER — ACETAMINOPHEN 325 MG PO TABS
650.0000 mg | ORAL_TABLET | Freq: Four times a day (QID) | ORAL | Status: DC | PRN
  Administered 2014-08-28: 650 mg via ORAL
  Filled 2014-08-23 (×2): qty 2

## 2014-08-23 MED ORDER — ONDANSETRON HCL 4 MG PO TABS: 4.0000 mg | ORAL_TABLET | Freq: Four times a day (QID) | ORAL | Status: DC | PRN

## 2014-08-23 MED ORDER — PIPERACILLIN-TAZOBACTAM 3.375 G IVPB
3.3750 g | Freq: Three times a day (TID) | INTRAVENOUS | Status: DC
  Administered 2014-08-24 – 2014-08-26 (×7): 3.375 g via INTRAVENOUS
  Filled 2014-08-23 (×9): qty 50

## 2014-08-23 MED ORDER — GUAIFENESIN-DM 100-10 MG/5ML PO SYRP
5.0000 mL | ORAL_SOLUTION | ORAL | Status: DC | PRN
  Filled 2014-08-23: qty 5

## 2014-08-23 MED ORDER — IOHEXOL 300 MG/ML  SOLN
25.0000 mL | INTRAMUSCULAR | Status: DC
  Administered 2014-08-23: 25 mL via ORAL

## 2014-08-23 MED ORDER — SODIUM CHLORIDE 0.9 % IV SOLN
INTRAVENOUS | Status: DC
  Administered 2014-08-23 – 2014-08-24 (×2): via INTRAVENOUS

## 2014-08-23 MED ORDER — PANTOPRAZOLE SODIUM 40 MG IV SOLR
40.0000 mg | INTRAVENOUS | Status: DC
  Administered 2014-08-23 – 2014-08-25 (×3): 40 mg via INTRAVENOUS
  Filled 2014-08-23 (×4): qty 40

## 2014-08-23 MED ORDER — PHYTONADIONE 5 MG PO TABS
5.0000 mg | ORAL_TABLET | Freq: Once | ORAL | Status: AC
  Administered 2014-08-23: 5 mg via ORAL
  Filled 2014-08-23: qty 1

## 2014-08-23 MED ORDER — MORPHINE SULFATE 2 MG/ML IJ SOLN
1.0000 mg | INTRAMUSCULAR | Status: DC | PRN
  Administered 2014-08-26 – 2014-08-27 (×2): 1 mg via INTRAVENOUS
  Filled 2014-08-23 (×2): qty 1

## 2014-08-23 MED ORDER — ZOLPIDEM TARTRATE 5 MG PO TABS
10.0000 mg | ORAL_TABLET | Freq: Every evening | ORAL | Status: DC | PRN
  Administered 2014-08-23 – 2014-08-27 (×3): 10 mg via ORAL
  Filled 2014-08-23 (×3): qty 2

## 2014-08-23 MED ORDER — ONDANSETRON HCL 4 MG/2ML IJ SOLN
4.0000 mg | Freq: Four times a day (QID) | INTRAMUSCULAR | Status: DC | PRN
  Administered 2014-08-25 – 2014-08-27 (×2): 4 mg via INTRAVENOUS
  Filled 2014-08-23 (×2): qty 2

## 2014-08-23 MED ORDER — POTASSIUM CHLORIDE 10 MEQ/100ML IV SOLN
10.0000 meq | Freq: Once | INTRAVENOUS | Status: AC
Start: 2014-08-23 — End: 2014-08-23
  Administered 2014-08-23: 10 meq via INTRAVENOUS
  Filled 2014-08-23: qty 100

## 2014-08-23 MED ORDER — ACETAMINOPHEN 650 MG RE SUPP: 650.0000 mg | Freq: Four times a day (QID) | RECTAL | Status: DC | PRN

## 2014-08-23 MED ORDER — FAMOTIDINE IN NACL 20-0.9 MG/50ML-% IV SOLN
20.0000 mg | Freq: Once | INTRAVENOUS | Status: AC
  Administered 2014-08-23: 20 mg via INTRAVENOUS
  Filled 2014-08-23: qty 50

## 2014-08-23 MED ORDER — LEVOTHYROXINE SODIUM 100 MCG PO TABS
100.0000 ug | ORAL_TABLET | Freq: Every day | ORAL | Status: DC
  Administered 2014-08-24 – 2014-08-28 (×5): 100 ug via ORAL
  Filled 2014-08-23 (×6): qty 1

## 2014-08-23 MED ORDER — HYDROMORPHONE HCL PF 1 MG/ML IJ SOLN: 1.0000 mg | INTRAMUSCULAR | Status: DC | PRN

## 2014-08-23 MED ORDER — ALPRAZOLAM 0.5 MG PO TABS
1.0000 mg | ORAL_TABLET | Freq: Four times a day (QID) | ORAL | Status: DC
  Administered 2014-08-23 – 2014-08-27 (×11): 1 mg via ORAL
  Filled 2014-08-23 (×13): qty 2

## 2014-08-23 MED ORDER — SODIUM CHLORIDE 0.9 % IV BOLUS (SEPSIS)
1000.0000 mL | Freq: Once | INTRAVENOUS | Status: AC
  Administered 2014-08-23: 1000 mL via INTRAVENOUS

## 2014-08-23 MED ORDER — ONDANSETRON HCL 4 MG/2ML IJ SOLN
4.0000 mg | Freq: Three times a day (TID) | INTRAMUSCULAR | Status: DC | PRN
Start: 2014-08-23 — End: 2014-08-23

## 2014-08-23 MED ORDER — ALBUTEROL SULFATE (2.5 MG/3ML) 0.083% IN NEBU: 2.5000 mg | INHALATION_SOLUTION | RESPIRATORY_TRACT | Status: DC | PRN

## 2014-08-23 MED ORDER — ATENOLOL 25 MG PO TABS
25.0000 mg | ORAL_TABLET | Freq: Every day | ORAL | Status: DC
  Administered 2014-08-24 – 2014-08-25 (×2): 25 mg via ORAL
  Filled 2014-08-23 (×3): qty 1

## 2014-08-23 MED ORDER — LABETALOL HCL 5 MG/ML IV SOLN
10.0000 mg | INTRAVENOUS | Status: DC | PRN
  Administered 2014-08-27 – 2014-08-28 (×3): 10 mg via INTRAVENOUS
  Filled 2014-08-23 (×4): qty 4

## 2014-08-23 MED ORDER — OXYCODONE HCL 5 MG PO TABS
5.0000 mg | ORAL_TABLET | ORAL | Status: DC | PRN
  Administered 2014-08-24 – 2014-08-28 (×6): 5 mg via ORAL
  Filled 2014-08-23 (×6): qty 1

## 2014-08-23 MED ORDER — LORAZEPAM 2 MG/ML IJ SOLN
1.0000 mg | Freq: Four times a day (QID) | INTRAMUSCULAR | Status: DC | PRN
  Administered 2014-08-25 – 2014-08-26 (×2): 1 mg via INTRAVENOUS
  Filled 2014-08-23 (×2): qty 1

## 2014-08-23 NOTE — ED Notes (Signed)
C/o weakness and noticed jaundice 1 week ago also has loss of appetite pt had fx lt clavicle 8 weeks ago and prresc ribed oxycodone prn as needed pt states she has been taking it around the clock every 4 hours

## 2014-08-23 NOTE — H&P (Addendum)
PATIENT DETAILS Name: [name] [name] Age: 78 y.o. Sex: female Date of Birth: 30-Mar-1936 Admit Date: 08/23/2014 OIN:[facility],NOBSJ CHARLES, MD   CHIEF COMPLAINT:  Weakness, yellowish discoloration of  skin, nausea, vomiting-6 weeks  HPI: [name] [name] is a 78 y.o. female with a Past Medical History of hypertension, irritable bowel syndrome, hypothyroidism, gastroesophageal reflux disease who presents today with the above noted complaint. Per patient, in early August she fell and broke her clavicle- for which she underwent open reduction and internal fixation on 07/18/14. She claims that immediately following this procedure she was in her usual state of health and without major complaints. A few weeks later, she then started developing weakness, loss of appetite. This was then associated with nausea and a few episodes of vomiting over the past few weeks. She denies any abdominal pain. She's also noticed that her stools are light yellow/brown in color for the past 2-3 weeks. She has had some intermittent itching as well. She initially refused to seek medical, however after her husband noted that she was jaundiced 2-3 days back, she reluctantly agreed to come to the emergency room today. Patient says husband also give a history of having "chills"-lasting hours almost on a daily basis, however they have not checked her temperature at home. In the ED, she was found to be markedly jaundiced with a bilirubin of almost 9, a CT scan of the abdomen without contrast showed a possible distal CBD lesion with both extrahepatic intrahepatic ductal dilatation. I was asked to admit this patient for further evaluation and treatment  Please note, patient denies any abdominal pain. She has irritable bowel syndrome and has approximately 2-3 bowel movements at baseline. She has approximately lost 8-10 pounds in the past 6 weeks. Patient also gives a history of worsening weakness for the past 6 weeks, this has  apparently resulted in 2-3 falls as well. No syncopal episodes however. Husband claims, that at times he needs to lift her out of bed.   ALLERGIES:   Allergies  Allergen Reactions  . Tetanus Toxoids Swelling  . Yellow Dyes (Non-Tartrazine) Itching    Makes patient nervous     PAST MEDICAL HISTORY: Past Medical History  Diagnosis Date  . Hypertension   . Hyperlipidemia   . Thyroid disease   . GERD (gastroesophageal reflux disease)   . Anxiety   . IBS (irritable bowel syndrome)   . Back pain   . Blood transfusion     at age of 4  . Hypothyroidism   . H/O hiatal hernia   . Anemia     hx of years ago   . Arthritis     psoriatic arthritis  . ARF (acute renal failure) 08/23/2014    PAST SURGICAL HISTORY: Past Surgical History  Procedure Laterality Date  . Tonsillectomy    . Ventral hernia repair  06/08/2012    Procedure: LAPAROSCOPIC VENTRAL HERNIA;  Surgeon: Adin Hector, MD;  Location: [facility];  Service: General;  Laterality: Right;  . Back surgery  2010    thoractic-screws and rods  . Joint replacement  2005    left shoulder replacement   . Cholecystectomy  1988    lap  . Abdominal hysterectomy      partial  . Colonoscopy    . Ercp    . Orif clavicular fracture Left 07/18/2014    Procedure: OPEN REDUCTION INTERNAL FIXATION (ORIF) LEFT CLAVICULAR FRACTURE;  Surgeon: Ninetta Lights, MD;  Location: [facility];  Service: Orthopedics;  Laterality: Left;    MEDICATIONS AT HOME: Prior to Admission medications   Medication Sig Start Date End Date Taking? Authorizing Provider  ALPRAZolam Duanne Moron) 1 MG tablet Take 1 mg by mouth 4 (four) times daily. As needed   Yes Historical Provider, MD  atenolol (TENORMIN) 50 MG tablet Take 50 mg by mouth daily with breakfast.    Yes Historical Provider, MD  desvenlafaxine (PRISTIQ) 50 MG 24 hr tablet Take 50 mg by mouth daily.   Yes Historical Provider, MD  Dietary Management Product (ENLYTE) CAPS Take 1 tablet by mouth  daily.   Yes Historical Provider, MD  esomeprazole (NEXIUM) 40 MG capsule Take 40 mg by mouth daily before breakfast.   Yes Historical Provider, MD  fenofibrate (TRICOR) 145 MG tablet Take 145 mg by mouth daily with breakfast.    Yes Historical Provider, MD  furosemide (LASIX) 20 MG tablet Take 20 mg by mouth daily with breakfast.    Yes Historical Provider, MD  gabapentin (NEURONTIN) 400 MG capsule Take 400 mg by mouth 6 (six) times daily.   Yes Historical Provider, MD  hydrochlorothiazide (HYDRODIURIL) 25 MG tablet Take 25 mg by mouth daily with breakfast.    Yes Historical Provider, MD  hyoscyamine (LEVBID) 0.375 MG 12 hr tablet Take 0.375 mg by mouth every 12 (twelve) hours as needed. For stomach cramps   Yes Historical Provider, MD  levothyroxine (SYNTHROID, LEVOTHROID) 100 MCG tablet Take 100 mcg by mouth daily before breakfast.   Yes Historical Provider, MD  Multiple Vitamin (MULTIVITAMIN WITH MINERALS) TABS tablet Take 1 tablet by mouth daily.   Yes Historical Provider, MD  ondansetron (ZOFRAN) 4 MG tablet Take 1 tablet (4 mg total) by mouth every 8 (eight) hours as needed for nausea or vomiting. 07/18/14  Yes M. Doran Stabler, PA-C  oxyCODONE-acetaminophen (ROXICET) 5-325 MG per tablet Take 1-2 tablets by mouth every 4 (four) hours as needed. 07/18/14  Yes M. Doran Stabler, PA-C  potassium chloride SA (K-DUR,KLOR-CON) 20 MEQ tablet Take 40 mEq by mouth 2 (two) times daily.    Yes Historical Provider, MD  tiZANidine (ZANAFLEX) 4 MG tablet Take 4 mg by mouth every 8 (eight) hours as needed for muscle spasms.   Yes Historical Provider, MD  traMADol (ULTRAM) 50 MG tablet Take 50 mg by mouth every 6 (six) hours as needed.   Yes Historical Provider, MD  zolpidem (AMBIEN) 10 MG tablet Take 10-20 mg by mouth at bedtime.    Yes Historical Provider, MD    FAMILY HISTORY: Family History  Problem Relation Age of Onset  . Heart disease Mother   . Cancer Father     lung    SOCIAL HISTORY:  reports  that she has never smoked. She has never used smokeless tobacco. She reports that she does not drink alcohol or use illicit drugs.  REVIEW OF SYSTEMS:  Constitutional:   + generalized weakness, +chills  HEENT:    No headaches, Difficulty swallowing,Tooth/dental problems,Sore throat,    Cardio-vascular: No chest pain,  Orthopnea, PND, swelling in lower extremities, anasarca, dizziness, palpitations  GI:  No heartburn, indigestion, abdominal pain  Resp: No shortness of breath with exertion or at rest.  No excess mucus, no productive cough, No non-productive cough,  No coughing up of blood.No change in color of mucus.No wheezing.No chest wall deformity  Skin:  no rash   GU:  no dysuria, change in color of urine, no urgency or frequency.  No  flank pain.  Musculoskeletal: No joint pain or swelling.  No decreased range of motion.  No back pain.  Psych: No change in mood or affect. No depression or anxiety.  No memory loss.   PHYSICAL EXAM: Blood pressure 114/78, pulse 81, temperature 98.3 F (36.8 C), temperature source Oral, resp. rate 21, height 5\' 5"  (1.651 m), weight 59.421 kg (131 lb), SpO2 97.00%.  General appearance :Awake, alert, not in any distress. Speech Clear. Chronically sick-looking. Grossly icteric. HEENT: Atraumatic and Normocephalic, pupils equally reactive to light and accomodation Neck: supple, no JVD. No cervical lymphadenopathy.  Chest:Good air entry bilaterally, no added sounds  CVS: S1 S2 regular, no murmurs.  Abdomen: Bowel sounds present, Non tender and not distended with no gaurding, rigidity or rebound. Extremities: B/L Lower Ext shows no edema, both legs are warm to touch Neurology: Awake alert, and oriented X 3, CN II-XII intact, Non focal- probably generalized weakness Skin:No Rash Wounds:N/A  LABS ON ADMISSION:   Recent Labs  08/23/14 1237  NA 138  K 3.1*  CL 100  CO2 20  GLUCOSE 87  BUN 57*  CREATININE 1.81*  CALCIUM 9.0     Recent Labs  08/23/14 1237  AST 89*  ALT 56*  ALKPHOS 342*  BILITOT 9.2*  PROT 6.7  ALBUMIN 2.2*    Recent Labs  08/23/14 1237  LIPASE 810*    Recent Labs  08/23/14 1237  WBC 14.2*  NEUTROABS 11.1*  HGB 10.9*  HCT 32.4*  MCV 88.5  PLT 197   No results found for this basename: CKTOTAL, CKMB, CKMBINDEX, TROPONINI,  in the last 72 hours No results found for this basename: DDIMER,  in the last 72 hours No components found with this basename: POCBNP,    RADIOLOGIC STUDIES ON ADMISSION: Ct Abdomen Pelvis Wo Contrast  08/23/2014   CLINICAL DATA:  Anorexia. Generalized weakness. Symptoms worse over the last 8 weeks. 15 lb weight loss. Frequent falls. Jaundice. No abdominal pain, nausea, vomiting.  EXAM: CT ABDOMEN AND PELVIS WITHOUT CONTRAST  TECHNIQUE: Multidetector CT imaging of the abdomen and pelvis was performed following the standard protocol without IV contrast.  COMPARISON:  CT of the abdomen and pelvis on 02/28/2012  FINDINGS: Lower chest: There is mild fibrosis at the lung bases which may be chronic. Atherosclerotic calcification is noted in the coronary vessels. There is a diaphragmatic node measuring 1.0 cm.  Upper abdomen: There is marked intrahepatic and extrahepatic biliary ductal dilatation. The common bile duct measures as large as 2.6 cm. A distal common bile duct hyperdense mass is faintly seen and measures 1.4 cm. This may represent a gallstone or soft tissue mass. The patient has had previous cholecystectomy. Given the lack of intravenous contrast, it would be difficult to exclude a liver lesion but none are seen. A probable hyperdense cyst is identified involving the midpole region right kidney and measures 1.6 cm. The left kidney has a normal appearance. The spleen has a normal appearance. Adrenal glands are unremarkable in appearance. No focal abnormality identified within the pancreas.  Bowel: The stomach and small bowel loops are normal in appearance. There  is moderate sigmoid diverticulosis. Significant stool is identified throughout the colon. The appendix is well seen and has a normal appearance.  Pelvis: The uterus is surgically absent. The urinary bladder is distended. Suspect right adnexal mass/cyst measuring 1.7 cm. Left adnexal region has a normal appearance. There is no free pelvic fluid.  Retroperitoneum: There is atherosclerotic calcification of the aorta. No retroperitoneal  or mesenteric adenopathy.  Abdominal wall: Unremarkable.  Osseous structures: Significant hardware from prior spinal fusion. No suspicious lytic or blastic lesions.  IMPRESSION: 1. Marked biliary dilatation to the level of the distal common bile duct. Faint hyperdense mass measuring 1.4 cm seen in the distal common bile duct. Considerations include a stone or tumor. Further evaluation is warranted. Consider MRI/MRCP if the patient is able to tolerate MRI. Otherwise, consider ultrasound of the abdomen. 2. Probable right renal cyst. 3. Sigmoid diverticulosis. 4. Suspect right adnexal mass/cyst measuring 1.7 cm. This has benign characteristics. No further evaluation is felt to be necessary. This recommendation follows ACR consensus guidelines: White Paper of the ACR Incidental Findings Committee II on Adnexal Findings. J Am Coll Radiol [id number].   Electronically Signed   By: Shon Hale M.D.   On: 08/23/2014 16:54   Ct Head Wo Contrast  08/23/2014   CLINICAL DATA:  78 year old female with fall, weakness, head and neck injury.  EXAM: CT HEAD WITHOUT CONTRAST  CT CERVICAL SPINE WITHOUT CONTRAST  TECHNIQUE: Multidetector CT imaging of the head and cervical spine was performed following the standard protocol without intravenous contrast. Multiplanar CT image reconstructions of the cervical spine were also generated.  COMPARISON:  03/28/2012 head CT  FINDINGS: CT HEAD FINDINGS  Minimal chronic small-vessel white matter ischemic changes again noted.  No acute intracranial abnormalities  are identified, including mass lesion or mass effect, hydrocephalus, extra-axial fluid collection, midline shift, hemorrhage, or acute infarction. The visualized bony calvarium is unremarkable.  CT CERVICAL SPINE FINDINGS  There is no evidence of acute fracture or subluxation or prevertebral soft tissue swelling.  Multilevel degenerative disc disease, spondylosis and facet arthropathy identified with moderate degenerative disc disease from C5-C7.  The soft tissue structures are unremarkable.  IMPRESSION: No evidence of acute intracranial abnormality or static evidence of acute injury to the cervical spine.  Degenerative changes within the cervical spine.   Electronically Signed   By: Hassan Rowan M.D.   On: 08/23/2014 16:47   Ct Cervical Spine Wo Contrast  08/23/2014   CLINICAL DATA:  78 year old female with fall, weakness, head and neck injury.  EXAM: CT HEAD WITHOUT CONTRAST  CT CERVICAL SPINE WITHOUT CONTRAST  TECHNIQUE: Multidetector CT imaging of the head and cervical spine was performed following the standard protocol without intravenous contrast. Multiplanar CT image reconstructions of the cervical spine were also generated.  COMPARISON:  03/28/2012 head CT  FINDINGS: CT HEAD FINDINGS  Minimal chronic small-vessel white matter ischemic changes again noted.  No acute intracranial abnormalities are identified, including mass lesion or mass effect, hydrocephalus, extra-axial fluid collection, midline shift, hemorrhage, or acute infarction. The visualized bony calvarium is unremarkable.  CT CERVICAL SPINE FINDINGS  There is no evidence of acute fracture or subluxation or prevertebral soft tissue swelling.  Multilevel degenerative disc disease, spondylosis and facet arthropathy identified with moderate degenerative disc disease from C5-C7.  The soft tissue structures are unremarkable.  IMPRESSION: No evidence of acute intracranial abnormality or static evidence of acute injury to the cervical spine.  Degenerative  changes within the cervical spine.   Electronically Signed   By: Hassan Rowan M.D.   On: 08/23/2014 16:47   US Abdomen Complete  08/23/2014   CLINICAL DATA:  78 year old female with jaundice. History of cholecystectomy.  EXAM: ULTRASOUND ABDOMEN COMPLETE  COMPARISON:  02/28/2012 CT  FINDINGS: Gallbladder:  Gallbladder is not visualized compatible with cholecystectomy.  Common bile duct:  Diameter: 18.6 mm. Intrahepatic biliary dilatation is identified. An  obstructing cause is not identified on this study but the mid and distal CBD are not well visualized.  Liver:  No focal lesion identified. Within normal limits in parenchymal echogenicity.  IVC:  No abnormality visualized.  Pancreas:  The head and tail of the pancreas are difficult to visualize. Pancreatic ductal dilatation is noted.  Spleen:  Size and appearance within normal limits.  Right Kidney:  Length: 12.1 cm. Echogenicity is upper limits of normal. No mass or hydronephrosis visualized. Multiple cysts are identified.  Left Kidney:  Length: 11.7 cm. Echogenicity is upper limits of normal. No mass or hydronephrosis visualized.  Abdominal aorta:  No aneurysm visualized.  Other findings:  None.  IMPRESSION: CBD and intrahepatic biliary dilatation and pancreatic ductal dilatation without identifiable obstructing cause on this study. The mid and distal CBD as well as the pancreatic head are not well visualized. Recommend further evaluation.  Upper limits of normal renal echogenicity which may be seen with medical renal disease.  Cholecystectomy.   Electronically Signed   By: Hassan Rowan M.D.   On: 08/23/2014 16:26   Dg Chest Port 1 View  08/23/2014   CLINICAL DATA:  SOB.  Weakness, jaundice, hypertension.  EXAM: PORTABLE CHEST - 1 VIEW  COMPARISON:  04/28/2007  FINDINGS: Shallow lung inflation. The heart is enlarged. There is perihilar peribronchial thickening. No focal consolidations or pleural effusions. No pulmonary edema. The patient has had previous left  clavicle ORIF an left shoulder arthroplasty. Patient has had posterior thoracolumbar fusion. Surgical clips are noted in the right upper quadrant of the abdomen.  IMPRESSION: 1. Shallow inflation. 2. Bronchitic changes. 3.  No focal acute pulmonary abnormality.   Electronically Signed   By: Shon Hale M.D.   On: 08/23/2014 13:32     EKG: Independently reviewed. Normal sinus rhythm  ASSESSMENT AND PLAN: Present on Admission:  . Obstructive jaundice - Painless jaundice - CT scan of the abdomen without contrast - shows a distal CBD lesion-patient is status post cholecystectomy (done in the 1990s) - at this time not sure whether this is malignant lesion or secondary to choledocholithiasis. She will require a ERCP. Given subjective fever we will empirically start on Zosyn, clear liquids tonight, keep n.p.o. post midnight. Case was discussed with Dr. Alferd Apa gastroenterology who will evaluate and provide further recommendations tomorrow. Note, afebrile here in the emergency room, with stable hemodynamics.  . Suspected Acute cholangitis - Given history of chills, leukocytosis-suspect some amount of cholangitis. Afebrile here in the emergency room, vital signs stable. Empirically started on Zosyn, obtain blood cultures. Await GI evaluation.   . ARF (acute renal failure) - Likely prerenal, given poor oral intake, use of diuretics and acute illness. - Hydrate with IV fluids, hold diuretics-follow electrolytes closely. If no improvement, will need renal ultrasound and further workup.  . Coagulopathy - Mildly coagulopathic, INR 1.87-likely secondary to obstructive jaundice. - Will give 5 mg of vitamin K orally, and repeat INR in a.m.  [name] GERD (gastroesophageal reflux disease) - Stable, continue PPI.   [name] Anxiety - On chronic Xanax at home-will continue   . IBS (irritable bowel syndrome) - per history-diarrhea predominant irritable bowel syndrome - given acute illness, elevated LFTs-will hold  all home medications for now. Resume when able   . Hypertension - Hold diuretics, cautiously continue with atenolol-half of home dose. Follow closely   . Frequent falls - Likely secondary to generalized weakness from acute illness/dehydration. Hydrate, treat underlying acute illness, PT evaluation. CT of the head and  CT of the cervical spine negative for acute abnormalities.  Further plan will depend as patient's clinical course evolves and further radiologic and laboratory data become available. Patient will be monitored closely.  Above noted plan was discussed with patient/spouse, they were in agreement.   DVT Prophylaxis: SCD's-may need ERCP soon  Code Status: Full Code  Total time spent for admission equals 45 minutes.  [facility] Pager [phone]  If 7PM-7AM, please contact night-coverage [email or web address] Password [facility] 08/23/2014, 6:42 PM  **Disclaimer: This note may have been dictated with voice recognition software. Similar sounding words can inadvertently be transcribed and this note may contain transcription errors which may not have been corrected upon publication of note.**

## 2014-08-23 NOTE — ED Provider Notes (Signed)
See prior note   Janice Norrie, MD 08/23/14 [phone]

## 2014-08-23 NOTE — ED Notes (Signed)
rerurned from ct

## 2014-08-23 NOTE — ED Provider Notes (Signed)
Pt presents with jaundice for the past 3 days,she has unchanged chronic back pain for the past 5 years. She has no abdominal pain but has had loss of appetite and weight loss. She denies dark urine.   Pt has obvious jaundice and scleral icterus, her abdomen is soft.   Medical screening examination/treatment/procedure(s) were conducted as a shared visit with non-physician practitioner(s) and myself.  I personally evaluated the patient during the encounter.   EKG Interpretation   Date/Time:  Friday August 23 2014 11:56:01 EDT Ventricular Rate:  66 PR Interval:    QRS Duration: 94 QT Interval:  442 QTC Calculation: 463 R Axis:   -25 Text Interpretation:  Normal sinus rhythm Left ventricular hypertrophy  Probable anterior infarct, age indeterminate Baseline wander No  significant change since last tracing 17 Jul 2014 Confirmed by [facility]   MD-I, Merryl Buckels ([id number]) on 08/23/2014 12:42:22 PM       Rolland Porter, MD, Alanson Aly, MD 08/23/14 1511

## 2014-08-23 NOTE — ED Notes (Signed)
To us

## 2014-08-23 NOTE — ED Notes (Signed)
Admitting at Bedside.  

## 2014-08-23 NOTE — ED Provider Notes (Signed)
Pt with painless jaundice, along with weakness/anorexia/unintentional weight loss.  Currently await CT.  Suspect pancreatic cancer.    5:48 PM Patient's labs are remarkable for an INR of 1.87 currently not on anticoagulants, WBC of 14.2 without left shift, potassium of 3.1 which she has received oral supplementation in the ED. Evidence of acute renal injury with BUN 57, creatinine 1.81 with a GFR of 26. Anion gap is 18. Evidence of transaminitis with AST 89, ALT 56, alkaline phosphatase 342. Her total bili is 9.2, direct bili is 7.5 and her indirect bili is 1.7. She has an elevated lipase of 810 her abdominal ultrasound reveals a common bile duct and intrahepatic biliary dilatation and pancreatic duct dilatation without identifiable cause on this study. Abdominal and pelvic CT scan reveals marked biliary dilatation to the level of the distal common bile duct. Faint hyperdense mass measuring 1.0 cm seen in the distal common bile duct. Considerations include stone or tumor. Consider MRI/MRCP if patient is able to tolerates MRI.  Given this finding, concerns for pancreatic cancer. Patient made aware of the finding. Patient will be admitted for further evaluation. I have consulted Dr. Sloan Leiter who agrees to admit pt to med surg, team 10, under his care.    BP 142/60  Pulse 71  Temp(Src) 98.3 F (36.8 C) (Oral)  Resp 23  Ht 5\' 5"  (1.651 m)  Wt 131 lb (59.421 kg)  BMI 21.80 kg/m2  SpO2 99%  I have reviewed nursing notes and vital signs. I personally reviewed the imaging tests through PACS system  I reviewed available ER/hospitalization records thought the EMR  Results for orders placed during the hospital encounter of 08/23/14  CBC WITH DIFFERENTIAL      Result Value Ref Range   WBC 14.2 (*) 4.0 - 10.5 K/uL   RBC 3.66 (*) 3.87 - 5.11 MIL/uL   Hemoglobin 10.9 (*) 12.0 - 15.0 g/dL   HCT 32.4 (*) 36.0 - 46.0 %   MCV 88.5  78.0 - 100.0 fL   MCH 29.8  26.0 - 34.0 pg   MCHC 33.6  30.0 - 36.0 g/dL    RDW 14.3  11.5 - 15.5 %   Platelets 197  150 - 400 K/uL   Neutrophils Relative % 79 (*) 43 - 77 %   Neutro Abs 11.1 (*) 1.7 - 7.7 K/uL   Lymphocytes Relative 9 (*) 12 - 46 %   Lymphs Abs 1.3  0.7 - 4.0 K/uL   Monocytes Relative 10  3 - 12 %   Monocytes Absolute 1.4 (*) 0.1 - 1.0 K/uL   Eosinophils Relative 2  0 - 5 %   Eosinophils Absolute 0.3  0.0 - 0.7 K/uL   Basophils Relative 0  0 - 1 %   Basophils Absolute 0.0  0.0 - 0.1 K/uL  BASIC METABOLIC PANEL      Result Value Ref Range   Sodium 138  137 - 147 mEq/L   Potassium 3.1 (*) 3.7 - 5.3 mEq/L   Chloride 100  96 - 112 mEq/L   CO2 20  19 - 32 mEq/L   Glucose, Bld 87  70 - 99 mg/dL   BUN 57 (*) 6 - 23 mg/dL   Creatinine, Ser 1.81 (*) 0.50 - 1.10 mg/dL   Calcium 9.0  8.4 - 10.5 mg/dL   GFR calc non Af Amer 26 (*) >90 mL/min   GFR calc Af Amer 30 (*) >90 mL/min   Anion gap 18 (*) 5 - 15  HEPATIC FUNCTION PANEL      Result Value Ref Range   Total Protein 6.7  6.0 - 8.3 g/dL   Albumin 2.2 (*) 3.5 - 5.2 g/dL   AST 89 (*) 0 - 37 U/L   ALT 56 (*) 0 - 35 U/L   Alkaline Phosphatase 342 (*) 39 - 117 U/L   Total Bilirubin 9.2 (*) 0.3 - 1.2 mg/dL   Bilirubin, Direct 7.5 (*) 0.0 - 0.3 mg/dL   Indirect Bilirubin 1.7 (*) 0.3 - 0.9 mg/dL  LIPASE, BLOOD      Result Value Ref Range   Lipase 810 (*) 11 - 59 U/L  PROTIME-INR      Result Value Ref Range   Prothrombin Time 21.5 (*) 11.6 - 15.2 seconds   INR 1.87 (*) 0.00 - 1.49  APTT      Result Value Ref Range   aPTT 42 (*) 24 - 37 seconds  ACETAMINOPHEN LEVEL      Result Value Ref Range   Acetaminophen (Tylenol), Serum <15.0  10 - 30 ug/mL  I-STAT TROPOININ, ED      Result Value Ref Range   Troponin i, poc 0.01  0.00 - 0.08 ng/mL   Comment 3            Ct Abdomen Pelvis Wo Contrast  08/23/2014   CLINICAL DATA:  Anorexia. Generalized weakness. Symptoms worse over the last 8 weeks. 15 lb weight loss. Frequent falls. Jaundice. No abdominal pain, nausea, vomiting.  EXAM: CT ABDOMEN AND  PELVIS WITHOUT CONTRAST  TECHNIQUE: Multidetector CT imaging of the abdomen and pelvis was performed following the standard protocol without IV contrast.  COMPARISON:  CT of the abdomen and pelvis on 02/28/2012  FINDINGS: Lower chest: There is mild fibrosis at the lung bases which may be chronic. Atherosclerotic calcification is noted in the coronary vessels. There is a diaphragmatic node measuring 1.0 cm.  Upper abdomen: There is marked intrahepatic and extrahepatic biliary ductal dilatation. The common bile duct measures as large as 2.6 cm. A distal common bile duct hyperdense mass is faintly seen and measures 1.4 cm. This may represent a gallstone or soft tissue mass. The patient has had previous cholecystectomy. Given the lack of intravenous contrast, it would be difficult to exclude a liver lesion but none are seen. A probable hyperdense cyst is identified involving the midpole region right kidney and measures 1.6 cm. The left kidney has a normal appearance. The spleen has a normal appearance. Adrenal glands are unremarkable in appearance. No focal abnormality identified within the pancreas.  Bowel: The stomach and small bowel loops are normal in appearance. There is moderate sigmoid diverticulosis. Significant stool is identified throughout the colon. The appendix is well seen and has a normal appearance.  Pelvis: The uterus is surgically absent. The urinary bladder is distended. Suspect right adnexal mass/cyst measuring 1.7 cm. Left adnexal region has a normal appearance. There is no free pelvic fluid.  Retroperitoneum: There is atherosclerotic calcification of the aorta. No retroperitoneal or mesenteric adenopathy.  Abdominal wall: Unremarkable.  Osseous structures: Significant hardware from prior spinal fusion. No suspicious lytic or blastic lesions.  IMPRESSION: 1. Marked biliary dilatation to the level of the distal common bile duct. Faint hyperdense mass measuring 1.4 cm seen in the distal common bile  duct. Considerations include a stone or tumor. Further evaluation is warranted. Consider MRI/MRCP if the patient is able to tolerate MRI. Otherwise, consider ultrasound of the abdomen. 2. Probable right renal cyst. 3. Sigmoid diverticulosis.  4. Suspect right adnexal mass/cyst measuring 1.7 cm. This has benign characteristics. No further evaluation is felt to be necessary. This recommendation follows ACR consensus guidelines: White Paper of the ACR Incidental Findings Committee II on Adnexal Findings. J Am Coll Radiol [id number].   Electronically Signed   By: Shon Hale M.D.   On: 08/23/2014 16:54   Ct Head Wo Contrast  08/23/2014   CLINICAL DATA:  78 year old female with fall, weakness, head and neck injury.  EXAM: CT HEAD WITHOUT CONTRAST  CT CERVICAL SPINE WITHOUT CONTRAST  TECHNIQUE: Multidetector CT imaging of the head and cervical spine was performed following the standard protocol without intravenous contrast. Multiplanar CT image reconstructions of the cervical spine were also generated.  COMPARISON:  03/28/2012 head CT  FINDINGS: CT HEAD FINDINGS  Minimal chronic small-vessel white matter ischemic changes again noted.  No acute intracranial abnormalities are identified, including mass lesion or mass effect, hydrocephalus, extra-axial fluid collection, midline shift, hemorrhage, or acute infarction. The visualized bony calvarium is unremarkable.  CT CERVICAL SPINE FINDINGS  There is no evidence of acute fracture or subluxation or prevertebral soft tissue swelling.  Multilevel degenerative disc disease, spondylosis and facet arthropathy identified with moderate degenerative disc disease from C5-C7.  The soft tissue structures are unremarkable.  IMPRESSION: No evidence of acute intracranial abnormality or static evidence of acute injury to the cervical spine.  Degenerative changes within the cervical spine.   Electronically Signed   By: Hassan Rowan M.D.   On: 08/23/2014 16:47   Ct Cervical Spine Wo  Contrast  08/23/2014   CLINICAL DATA:  78 year old female with fall, weakness, head and neck injury.  EXAM: CT HEAD WITHOUT CONTRAST  CT CERVICAL SPINE WITHOUT CONTRAST  TECHNIQUE: Multidetector CT imaging of the head and cervical spine was performed following the standard protocol without intravenous contrast. Multiplanar CT image reconstructions of the cervical spine were also generated.  COMPARISON:  03/28/2012 head CT  FINDINGS: CT HEAD FINDINGS  Minimal chronic small-vessel white matter ischemic changes again noted.  No acute intracranial abnormalities are identified, including mass lesion or mass effect, hydrocephalus, extra-axial fluid collection, midline shift, hemorrhage, or acute infarction. The visualized bony calvarium is unremarkable.  CT CERVICAL SPINE FINDINGS  There is no evidence of acute fracture or subluxation or prevertebral soft tissue swelling.  Multilevel degenerative disc disease, spondylosis and facet arthropathy identified with moderate degenerative disc disease from C5-C7.  The soft tissue structures are unremarkable.  IMPRESSION: No evidence of acute intracranial abnormality or static evidence of acute injury to the cervical spine.  Degenerative changes within the cervical spine.   Electronically Signed   By: Hassan Rowan M.D.   On: 08/23/2014 16:47   US Abdomen Complete  08/23/2014   CLINICAL DATA:  78 year old female with jaundice. History of cholecystectomy.  EXAM: ULTRASOUND ABDOMEN COMPLETE  COMPARISON:  02/28/2012 CT  FINDINGS: Gallbladder:  Gallbladder is not visualized compatible with cholecystectomy.  Common bile duct:  Diameter: 18.6 mm. Intrahepatic biliary dilatation is identified. An obstructing cause is not identified on this study but the mid and distal CBD are not well visualized.  Liver:  No focal lesion identified. Within normal limits in parenchymal echogenicity.  IVC:  No abnormality visualized.  Pancreas:  The head and tail of the pancreas are difficult to visualize.  Pancreatic ductal dilatation is noted.  Spleen:  Size and appearance within normal limits.  Right Kidney:  Length: 12.1 cm. Echogenicity is upper limits of normal. No mass or hydronephrosis visualized. Multiple  cysts are identified.  Left Kidney:  Length: 11.7 cm. Echogenicity is upper limits of normal. No mass or hydronephrosis visualized.  Abdominal aorta:  No aneurysm visualized.  Other findings:  None.  IMPRESSION: CBD and intrahepatic biliary dilatation and pancreatic ductal dilatation without identifiable obstructing cause on this study. The mid and distal CBD as well as the pancreatic head are not well visualized. Recommend further evaluation.  Upper limits of normal renal echogenicity which may be seen with medical renal disease.  Cholecystectomy.   Electronically Signed   By: Hassan Rowan M.D.   On: 08/23/2014 16:26   Dg Chest Port 1 View  08/23/2014   CLINICAL DATA:  SOB.  Weakness, jaundice, hypertension.  EXAM: PORTABLE CHEST - 1 VIEW  COMPARISON:  04/28/2007  FINDINGS: Shallow lung inflation. The heart is enlarged. There is perihilar peribronchial thickening. No focal consolidations or pleural effusions. No pulmonary edema. The patient has had previous left clavicle ORIF an left shoulder arthroplasty. Patient has had posterior thoracolumbar fusion. Surgical clips are noted in the right upper quadrant of the abdomen.  IMPRESSION: 1. Shallow inflation. 2. Bronchitic changes. 3.  No focal acute pulmonary abnormality.   Electronically Signed   By: Shon Hale M.D.   On: 08/23/2014 13:32      Domenic Moras, PA-C 08/23/14 1752

## 2014-08-23 NOTE — ED Provider Notes (Signed)
CSN: [id number]     Arrival date & time 08/23/14  1145 History   First MD Initiated Contact with Patient 08/23/14 1218     Chief Complaint  Patient presents with  . Weakness     (Consider location/radiation/quality/duration/timing/severity/associated sxs/prior Treatment) HPI  [name] [name] is a pleasant 78 y.o. female with past medical history significant for hypertension, hyperlipidemia, hypothyroid, IBS, chronic low back pain, complaining of anorexia, generalized weakness worsening over the course of 8 weeks (15lb weight loss). Patient states she is so weak that she is having frequent falls with 3 falls yesterday. Her husband convinced her to come to the emergency room today. Jaundice was noticed 2 days ago. She denies any abdominal pain, nausea or vomiting. She does not drink any alcohol now and has never been a heavy drinker. Patient has been taking 5 mg Percocets 1-2 tabs every 4 hours. She has not been taking any extra acetaminophen. She is status post remote cholecystectomy. She states she has not had this in 3 days. Patient endorses chills with no fever. States that she is falling from generalized weakness. Denies headache, ataxia, change in vision, significant head trauma with falls, anticoagulation.  Patient had a clavicular fracture ago with ORIF on 8/6/20015.   PCP [name]: [name] (seen for colonoscopy)   Past Medical History  Diagnosis Date  . Hypertension   . Hyperlipidemia   . Thyroid disease   . GERD (gastroesophageal reflux disease)   . Anxiety   . IBS (irritable bowel syndrome)   . Back pain   . Blood transfusion     at age of 4  . Hypothyroidism   . H/O hiatal hernia   . Anemia     hx of years ago   . Arthritis     psoriatic arthritis   Past Surgical History  Procedure Laterality Date  . Tonsillectomy    . Ventral hernia repair  06/08/2012    Procedure: LAPAROSCOPIC VENTRAL HERNIA;  Surgeon: Adin Hector, MD;  Location: [facility];   Service: General;  Laterality: Right;  . Back surgery  2010    thoractic-screws and rods  . Joint replacement  2005    left shoulder replacement   . Cholecystectomy  1988    lap  . Abdominal hysterectomy      partial  . Colonoscopy    . Ercp    . Orif clavicular fracture Left 07/18/2014    Procedure: OPEN REDUCTION INTERNAL FIXATION (ORIF) LEFT CLAVICULAR FRACTURE;  Surgeon: Ninetta Lights, MD;  Location: [facility];  Service: Orthopedics;  Laterality: Left;   Family History  Problem Relation Age of Onset  . Heart disease Mother   . Cancer Father     lung   History  Substance Use Topics  . Smoking status: Never Smoker   . Smokeless tobacco: Never Used  . Alcohol Use: No     Comment: rare   OB History   Grav Para Term Preterm Abortions TAB SAB Ect Mult Living                 Review of Systems  10 systems reviewed and found to be negative, except as noted in the HPI.   Allergies  Tetanus toxoids and Yellow dyes (non-tartrazine)  Home Medications   Prior to Admission medications   Medication Sig Start Date End Date Taking? Authorizing Provider  ALPRAZolam Duanne Moron) 1 MG tablet Take 1 mg by mouth 4 (four) times daily. As needed  Yes Historical Provider, MD  atenolol (TENORMIN) 50 MG tablet Take 50 mg by mouth daily with breakfast.    Yes Historical Provider, MD  desvenlafaxine (PRISTIQ) 50 MG 24 hr tablet Take 50 mg by mouth daily.   Yes Historical Provider, MD  Dietary Management Product (ENLYTE) CAPS Take 1 tablet by mouth daily.   Yes Historical Provider, MD  esomeprazole (NEXIUM) 40 MG capsule Take 40 mg by mouth daily before breakfast.   Yes Historical Provider, MD  fenofibrate (TRICOR) 145 MG tablet Take 145 mg by mouth daily with breakfast.    Yes Historical Provider, MD  furosemide (LASIX) 20 MG tablet Take 20 mg by mouth daily with breakfast.    Yes Historical Provider, MD  gabapentin (NEURONTIN) 400 MG capsule Take 400 mg by mouth 6 (six) times  daily.   Yes Historical Provider, MD  hydrochlorothiazide (HYDRODIURIL) 25 MG tablet Take 25 mg by mouth daily with breakfast.    Yes Historical Provider, MD  hyoscyamine (LEVBID) 0.375 MG 12 hr tablet Take 0.375 mg by mouth every 12 (twelve) hours as needed. For stomach cramps   Yes Historical Provider, MD  levothyroxine (SYNTHROID, LEVOTHROID) 100 MCG tablet Take 100 mcg by mouth daily before breakfast.   Yes Historical Provider, MD  Multiple Vitamin (MULTIVITAMIN WITH MINERALS) TABS tablet Take 1 tablet by mouth daily.   Yes Historical Provider, MD  ondansetron (ZOFRAN) 4 MG tablet Take 1 tablet (4 mg total) by mouth every 8 (eight) hours as needed for nausea or vomiting. 07/18/14  Yes M. Doran Stabler, PA-C  oxyCODONE-acetaminophen (ROXICET) 5-325 MG per tablet Take 1-2 tablets by mouth every 4 (four) hours as needed. 07/18/14  Yes M. Doran Stabler, PA-C  potassium chloride SA (K-DUR,KLOR-CON) 20 MEQ tablet Take 40 mEq by mouth 2 (two) times daily.    Yes Historical Provider, MD  tiZANidine (ZANAFLEX) 4 MG tablet Take 4 mg by mouth every 8 (eight) hours as needed for muscle spasms.   Yes Historical Provider, MD  traMADol (ULTRAM) 50 MG tablet Take 50 mg by mouth every 6 (six) hours as needed.   Yes Historical Provider, MD  zolpidem (AMBIEN) 10 MG tablet Take 10-20 mg by mouth at bedtime.    Yes Historical Provider, MD   BP 136/61  Pulse 70  Temp(Src) 98.3 F (36.8 C) (Oral)  Resp 14  Ht 5\' 5"  (1.651 m)  Wt 131 lb (59.421 kg)  BMI 21.80 kg/m2  SpO2 96% Physical Exam  Nursing note and vitals reviewed. Constitutional: She is oriented to person, place, and time. She appears well-developed and well-nourished. No distress.  HENT:  Head: Normocephalic.  Mouth/Throat: Oropharynx is clear and moist.  Severe jaundice  Mild contusion on the right forehead  Eyes: Conjunctivae and EOM are normal. Pupils are equal, round, and reactive to light.  Neck: Normal range of motion. Neck supple.  No  midline C-spine  tenderness to palpation or step-offs appreciated. Patient has full range of motion without pain.   Cardiovascular: Normal rate, regular rhythm and intact distal pulses.   Pulmonary/Chest: Effort normal. No stridor.  Abdominal: Soft. She exhibits no distension and no mass. There is tenderness. There is no rebound and no guarding.  Mild tenderness palpation in the right upper quadrant, no guarding or rebound  Musculoskeletal: Normal range of motion. She exhibits no edema and no tenderness.  Remote surgical scars to lumbar back. No midline tenderness palpation.  Neurological: She is alert and oriented to person, place, and time.  Patient  is 3/5 strength to bilateral lower extremities, 4/5 strength to bilateral upper extremities. Distal sensation is intact. Patient is able to ambulate with support on both sides.  Psychiatric: She has a normal mood and affect.    ED Course  Procedures (including critical care time) Labs Review Labs Reviewed  CBC WITH DIFFERENTIAL - Abnormal; Notable for the following:    WBC 14.2 (*)    RBC 3.66 (*)    Hemoglobin 10.9 (*)    HCT 32.4 (*)    Neutrophils Relative % 79 (*)    Neutro Abs 11.1 (*)    Lymphocytes Relative 9 (*)    Monocytes Absolute 1.4 (*)    All other components within normal limits  BASIC METABOLIC PANEL - Abnormal; Notable for the following:    Potassium 3.1 (*)    BUN 57 (*)    Creatinine, Ser 1.81 (*)    GFR calc non Af Amer 26 (*)    GFR calc Af Amer 30 (*)    Anion gap 18 (*)    All other components within normal limits  HEPATIC FUNCTION PANEL - Abnormal; Notable for the following:    Albumin 2.2 (*)    AST 89 (*)    ALT 56 (*)    Alkaline Phosphatase 342 (*)    Total Bilirubin 9.2 (*)    Bilirubin, Direct 7.5 (*)    Indirect Bilirubin 1.7 (*)    All other components within normal limits  LIPASE, BLOOD - Abnormal; Notable for the following:    Lipase 810 (*)    All other components within normal limits   PROTIME-INR - Abnormal; Notable for the following:    Prothrombin Time 21.5 (*)    INR 1.87 (*)    All other components within normal limits  APTT - Abnormal; Notable for the following:    aPTT 42 (*)    All other components within normal limits  ACETAMINOPHEN LEVEL  I-STAT TROPOININ, ED    Imaging Review Dg Chest Port 1 View  08/23/2014   CLINICAL DATA:  SOB.  Weakness, jaundice, hypertension.  EXAM: PORTABLE CHEST - 1 VIEW  COMPARISON:  04/28/2007  FINDINGS: Shallow lung inflation. The heart is enlarged. There is perihilar peribronchial thickening. No focal consolidations or pleural effusions. No pulmonary edema. The patient has had previous left clavicle ORIF an left shoulder arthroplasty. Patient has had posterior thoracolumbar fusion. Surgical clips are noted in the right upper quadrant of the abdomen.  IMPRESSION: 1. Shallow inflation. 2. Bronchitic changes. 3.  No focal acute pulmonary abnormality.   Electronically Signed   By: Shon Hale M.D.   On: 08/23/2014 13:32     EKG Interpretation   Date/Time:  Friday August 23 2014 11:56:01 EDT Ventricular Rate:  66 PR Interval:    QRS Duration: 94 QT Interval:  442 QTC Calculation: 463 R Axis:   -25 Text Interpretation:  Normal sinus rhythm Left ventricular hypertrophy  Probable anterior infarct, age indeterminate Baseline wander No  significant change since last tracing 17 Jul 2014 Confirmed by KNAPP   MD-I, IVA ([id number]) on 08/23/2014 12:42:22 PM      MDM   Final diagnoses:  AKI (acute kidney injury)  Jaundice  Frequent falls    Filed Vitals:   08/23/14 1215 08/23/14 1230 08/23/14 1245 08/23/14 1415  BP: 123/57 125/51 112/46 136/61  Pulse: 66 65 65 70  Temp:      TempSrc:      Resp: 20 23 23 14   Height:  Weight:      SpO2: 98% 99% 100% 96%    Medications  iohexol (OMNIPAQUE) 300 MG/ML solution 25 mL (25 mLs Oral Contrast Given 08/23/14 1438)  potassium chloride 10 mEq in 100 mL IVPB (10 mEq Intravenous  New Bag/Given 08/23/14 1436)  sodium chloride 0.9 % bolus 1,000 mL (1,000 mLs Intravenous New Bag/Given 08/23/14 1356)  famotidine (PEPCID) IVPB 20 mg (0 mg Intravenous Stopped 08/23/14 1420)    GENEVER HENTGES is a 78 y.o. female presenting with generalized weakness, anorexia, unintentional weight loss, painless jaundice worsening over the course of 2 months. John the cyst appeared 3 days ago. Abdominal exam is benign. Patient has acute kidney injury with a creatinine of 1.8. Her liver function tests are mildly elevated at AST of 89 and ALT of 56. Alkaline phosphatase is 342, bili is 9.2 and direct bilirubin is 7.5. Her lipase is >800. In concern for a mass lesion blocking the biliary tree. Patient cannot have scan with contrast secondary to GFR. She will receive oral only contrast. Patient will have abdominal ultrasound and her head and neck CT secondary to frequent falls and increased INR.  Patient is pain-free, declines pain medication she will be aggressively hydrated (no cardiac issues).   Case signed out to PA Rona Ravens at shift change plan is to followup imaging and admission for acute kidney injury.  This is a shared visit with the attending physician who personally evaluated the patient and agrees with the care plan.         Monico Blitz, PA-C 08/23/14 1622

## 2014-08-23 NOTE — Progress Notes (Signed)
ANTIBIOTIC CONSULT NOTE - INITIAL  Pharmacy Consult for Zosyn Indication: Cholangitis  Allergies  Allergen Reactions  . Tetanus Toxoids Swelling  . Yellow Dyes (Non-Tartrazine) Itching    Makes patient nervous     Patient Measurements: Height: 5\' 5"  (165.1 cm) Weight: 131 lb (59.421 kg) IBW/kg (Calculated) : 57  Vital Signs: Temp: 98.3 F (36.8 C) (09/11 1156) Temp src: Oral (09/11 1156) BP: 114/78 mmHg (09/11 1830) Pulse Rate: 81 (09/11 1830) Intake/Output from previous day:   Intake/Output from this shift:    Labs:  Recent Labs  08/23/14 1237  WBC 14.2*  HGB 10.9*  PLT 197  CREATININE 1.81*   Estimated Creatinine Clearance: 24.2 ml/min (by C-G formula based on Cr of 1.81). No results found for this basename: VANCOTROUGH, VANCOPEAK, VANCORANDOM, GENTTROUGH, GENTPEAK, GENTRANDOM, TOBRATROUGH, TOBRAPEAK, TOBRARND, AMIKACINPEAK, AMIKACINTROU, AMIKACIN,  in the last 72 hours   Microbiology: No results found for this or any previous visit (from the past 720 hour(s)).  Medical History: Past Medical History  Diagnosis Date  . Hypertension   . Hyperlipidemia   . Thyroid disease   . GERD (gastroesophageal reflux disease)   . Anxiety   . IBS (irritable bowel syndrome)   . Back pain   . Blood transfusion     at age of 4  . Hypothyroidism   . H/O hiatal hernia   . Anemia     hx of years ago   . Arthritis     psoriatic arthritis    Medications:   (Not in a hospital admission) Assessment: 78 yo F admitted 08/23/2014  With jaundice, weakness and weight loss.  Pharmacy consulted to dose zosyn for cholangitis.  PMH: HTN, hyperlipidemia, hypothyroidism, arthritis, GERD, anxiety.  ID: cholangitis, WBC elevated, afeb 9/11 >> Zosyn  Goal of Therapy:  Renal adjustment of antibiotics.   Plan:  Zosyn 3.375g IV q8h infuse over 4h Follow up SCr, UOP, cultures, clinical course and adjust as clinically indicated.   Thank you for allowing pharmacy to be a part  of this patients care team.  Rowe Robert Pharm.D., BCPS, AQ-Cardiology Clinical Pharmacist 08/23/2014 6:41 PM Pager: [phone] Phone: [phone]

## 2014-08-24 DIAGNOSIS — E46 Unspecified protein-calorie malnutrition: Secondary | ICD-10-CM | POA: Insufficient documentation

## 2014-08-24 LAB — BASIC METABOLIC PANEL
ANION GAP: 17 — AB (ref 5–15)
BUN: 28 mg/dL — ABNORMAL HIGH (ref 6–23)
CALCIUM: 8 mg/dL — AB (ref 8.4–10.5)
CO2: 16 mEq/L — ABNORMAL LOW (ref 19–32)
CREATININE: 0.84 mg/dL (ref 0.50–1.10)
Chloride: 108 mEq/L (ref 96–112)
GFR calc Af Amer: 77 mL/min — ABNORMAL LOW (ref >90)
GFR, EST NON AFRICAN AMERICAN: 66 mL/min — AB (ref >90)
Glucose, Bld: 89 mg/dL (ref 70–99)
Potassium: 2.9 mEq/L — CL (ref 3.7–5.3)
Sodium: 141 mEq/L (ref 137–147)

## 2014-08-24 LAB — CBC
HEMATOCRIT: 29.1 % — AB (ref 36.0–46.0)
HEMOGLOBIN: 9.7 g/dL — AB (ref 12.0–15.0)
MCH: 29.4 pg (ref 26.0–34.0)
MCHC: 33.3 g/dL (ref 30.0–36.0)
MCV: 88.2 fL (ref 78.0–100.0)
Platelets: 199 10*3/uL (ref 150–400)
RBC: 3.3 MIL/uL — AB (ref 3.87–5.11)
RDW: 14.4 % (ref 11.5–15.5)
WBC: 13.6 10*3/uL — ABNORMAL HIGH (ref 4.0–10.5)

## 2014-08-24 LAB — HEPATIC FUNCTION PANEL
ALK PHOS: 367 U/L — AB (ref 39–117)
ALT: 47 U/L — ABNORMAL HIGH (ref 0–35)
AST: 64 U/L — ABNORMAL HIGH (ref 0–37)
Albumin: 1.8 g/dL — ABNORMAL LOW (ref 3.5–5.2)
BILIRUBIN INDIRECT: 1.1 mg/dL — AB (ref 0.3–0.9)
BILIRUBIN TOTAL: 8.5 mg/dL — AB (ref 0.3–1.2)
Bilirubin, Direct: 7.4 mg/dL — ABNORMAL HIGH (ref 0.0–0.3)
Total Protein: 5.5 g/dL — ABNORMAL LOW (ref 6.0–8.3)

## 2014-08-24 LAB — PROTIME-INR
INR: 1.93 — ABNORMAL HIGH (ref 0.00–1.49)
Prothrombin Time: 22.1 seconds — ABNORMAL HIGH (ref 11.6–15.2)

## 2014-08-24 LAB — CLOSTRIDIUM DIFFICILE BY PCR: Toxigenic C. Difficile by PCR: POSITIVE — AB

## 2014-08-24 MED ORDER — VITAMIN K1 10 MG/ML IJ SOLN
5.0000 mg | Freq: Once | INTRAMUSCULAR | Status: AC
  Administered 2014-08-24: 5 mg via INTRAVENOUS
  Filled 2014-08-24: qty 0.5

## 2014-08-24 MED ORDER — POTASSIUM CHLORIDE 10 MEQ/100ML IV SOLN
10.0000 meq | INTRAVENOUS | Status: AC
  Administered 2014-08-24 (×4): 10 meq via INTRAVENOUS
  Filled 2014-08-24 (×4): qty 100

## 2014-08-24 MED ORDER — SODIUM CHLORIDE 0.9 % IV SOLN
INTRAVENOUS | Status: DC
  Administered 2014-08-24 – 2014-08-28 (×5): via INTRAVENOUS
  Filled 2014-08-24 (×9): qty 1000

## 2014-08-24 MED ORDER — METRONIDAZOLE 500 MG PO TABS
500.0000 mg | ORAL_TABLET | Freq: Three times a day (TID) | ORAL | Status: DC
  Administered 2014-08-24 – 2014-08-28 (×12): 500 mg via ORAL
  Filled 2014-08-24 (×15): qty 1

## 2014-08-24 NOTE — ED Provider Notes (Signed)
Medical screening examination/treatment/procedure(s) were performed by non-physician practitioner and as supervising physician I was immediately available for consultation/collaboration.   Dot Lanes, MD 08/24/14 1043

## 2014-08-24 NOTE — Progress Notes (Signed)
Pt arrived on unit, alert oriented x4. Able to make needs known. In no acute distress. No SOB noted. Vital signs taken and stable. LFA IV site clean, dry and intact. Skin dry and intact, bruising noted and charted. Pt care guide packet provided. Oriented to room and staff. Call light place within reached. Bed on its lowest position. Pt categorized as high fall risk, fall risk plan initiated, pt verbalized understanding. We will continue to monitor.

## 2014-08-24 NOTE — Consult Note (Signed)
[facility] CONSULT Reason for consult: Biliary Obstruction Referring Physician: [facility]. PCP: Dr. Mirian Capuchin [name] [name] is an 78 y.o. female.  HPI: patient had a fall with a fractured clavicle about 6 weeks ago and underwent open reduction and internal fixation 8/6. This procedure was done with general anesthesia. She had a preop Bmet no LFT is done at that time. According to her husband she went home and gradually developed increasing weakness and anorexia but was not complaining of any pain. She did have some pain medicine initially because of her fractured clavicle. The patient denied any postprandial abdominal pain. Husband notes that for a couple days she had pale stools and they notice that her eyes were yellow. She was nauseated. She reported having chills but no fever. She presented to the emergency room and was found to have a bilirubin 9 and CT scan done without contrast a cause of elevated renal functions. Patient had marked dilated intra-and extra hepatic biliary system with a 1.4 cm mass located near the junction of the common bile duct and the pancreatic duct. I have reviewed these films with radiology and it appears that this is more of a soft tissue mass and may not be a stone. Unfortunately oral contrast is around the area and there is no IV contrast. It was felt by radiology that this was suspicious for a soft tissue tumor but a CBD stone could not be ruled out. Patient reports that she simply feels bad at this not have any abdominal pain currently. She is post cholecystectomy many years ago. Husband thinks it was approximately 25. Patient has had previous EGD is in dilatation's in the past by Dr. [name] and previous colonoscopies by Dr. [name]. It appears that she has lost approximately 15 pounds over the past 4 to 6 weeks. Past Medical History  Diagnosis Date  . Hypertension   . Hyperlipidemia   . Thyroid disease   . GERD (gastroesophageal reflux disease)    . Anxiety   . IBS (irritable bowel syndrome)   . Back pain   . Blood transfusion     at age of 4  . Hypothyroidism   . H/O hiatal hernia   . Anemia     hx of years ago   . Arthritis     psoriatic arthritis  . ARF (acute renal failure) 08/23/2014    Past Surgical History  Procedure Laterality Date  . Tonsillectomy    . Ventral hernia repair  06/08/2012    Procedure: LAPAROSCOPIC VENTRAL HERNIA;  Surgeon: Adin Hector, MD;  Location: [facility];  Service: General;  Laterality: Right;  . Back surgery  2010    thoractic-screws and rods  . Joint replacement  2005    left shoulder replacement   . Cholecystectomy  1988    lap  . Abdominal hysterectomy      partial  . Colonoscopy    . Ercp    . Orif clavicular fracture Left 07/18/2014    Procedure: OPEN REDUCTION INTERNAL FIXATION (ORIF) LEFT CLAVICULAR FRACTURE;  Surgeon: Ninetta Lights, MD;  Location: [facility];  Service: Orthopedics;  Laterality: Left;    Family History  Problem Relation Age of Onset  . Heart disease Mother   . Cancer Father     lung    Social History:  reports that she has never smoked. She has never used smokeless tobacco. She reports that she does not drink alcohol or use illicit drugs.  Allergies:  Allergies  Allergen Reactions  . Tetanus Toxoids Swelling  . Yellow Dyes (Non-Tartrazine) Itching    Makes patient nervous     Medications; Prior to Admission medications   Medication Sig Start Date End Date Taking? Authorizing Provider  ALPRAZolam Duanne Moron) 1 MG tablet Take 1 mg by mouth 4 (four) times daily. As needed   Yes Historical Provider, MD  atenolol (TENORMIN) 50 MG tablet Take 50 mg by mouth daily with breakfast.    Yes Historical Provider, MD  desvenlafaxine (PRISTIQ) 50 MG 24 hr tablet Take 50 mg by mouth daily.   Yes Historical Provider, MD  Dietary Management Product (ENLYTE) CAPS Take 1 tablet by mouth daily.   Yes Historical Provider, MD  esomeprazole (NEXIUM) 40 MG  capsule Take 40 mg by mouth daily before breakfast.   Yes Historical Provider, MD  fenofibrate (TRICOR) 145 MG tablet Take 145 mg by mouth daily with breakfast.    Yes Historical Provider, MD  furosemide (LASIX) 20 MG tablet Take 20 mg by mouth daily with breakfast.    Yes Historical Provider, MD  gabapentin (NEURONTIN) 400 MG capsule Take 400 mg by mouth 6 (six) times daily.   Yes Historical Provider, MD  hydrochlorothiazide (HYDRODIURIL) 25 MG tablet Take 25 mg by mouth daily with breakfast.    Yes Historical Provider, MD  hyoscyamine (LEVBID) 0.375 MG 12 hr tablet Take 0.375 mg by mouth every 12 (twelve) hours as needed. For stomach cramps   Yes Historical Provider, MD  levothyroxine (SYNTHROID, LEVOTHROID) 100 MCG tablet Take 100 mcg by mouth daily before breakfast.   Yes Historical Provider, MD  Multiple Vitamin (MULTIVITAMIN WITH MINERALS) TABS tablet Take 1 tablet by mouth daily.   Yes Historical Provider, MD  ondansetron (ZOFRAN) 4 MG tablet Take 1 tablet (4 mg total) by mouth every 8 (eight) hours as needed for nausea or vomiting. 07/18/14  Yes M. Doran Stabler, PA-C  oxyCODONE-acetaminophen (ROXICET) 5-325 MG per tablet Take 1-2 tablets by mouth every 4 (four) hours as needed. 07/18/14  Yes M. Doran Stabler, PA-C  potassium chloride SA (K-DUR,KLOR-CON) 20 MEQ tablet Take 40 mEq by mouth 2 (two) times daily.    Yes Historical Provider, MD  tiZANidine (ZANAFLEX) 4 MG tablet Take 4 mg by mouth every 8 (eight) hours as needed for muscle spasms.   Yes Historical Provider, MD  traMADol (ULTRAM) 50 MG tablet Take 50 mg by mouth every 6 (six) hours as needed.   Yes Historical Provider, MD  zolpidem (AMBIEN) 10 MG tablet Take 10-20 mg by mouth at bedtime.    Yes Historical Provider, MD   . ALPRAZolam  1 mg Oral QID  . atenolol  25 mg Oral Q breakfast  . levothyroxine  100 mcg Oral QAC breakfast  . pantoprazole (PROTONIX) IV  40 mg Intravenous Q24H  . piperacillin-tazobactam (ZOSYN)  IV  3.375 g  Intravenous 3 times per day  . potassium chloride  10 mEq Intravenous Q1 Hr x 4   PRN Meds acetaminophen, acetaminophen, albuterol, guaiFENesin-dextromethorphan, labetalol, LORazepam, morphine injection, ondansetron (ZOFRAN) IV, ondansetron, oxyCODONE, zolpidem Results for orders placed during the hospital encounter of 08/23/14 (from the past 48 hour(s))  CBC WITH DIFFERENTIAL     Status: Abnormal   Collection Time    08/23/14 12:37 PM      Result Value Ref Range   WBC 14.2 (*) 4.0 - 10.5 K/uL   RBC 3.66 (*) 3.87 - 5.11 MIL/uL   Hemoglobin 10.9 (*) 12.0 - 15.0 g/dL  HCT 32.4 (*) 36.0 - 46.0 %   MCV 88.5  78.0 - 100.0 fL   MCH 29.8  26.0 - 34.0 pg   MCHC 33.6  30.0 - 36.0 g/dL   RDW 14.3  11.5 - 15.5 %   Platelets 197  150 - 400 K/uL   Neutrophils Relative % 79 (*) 43 - 77 %   Neutro Abs 11.1 (*) 1.7 - 7.7 K/uL   Lymphocytes Relative 9 (*) 12 - 46 %   Lymphs Abs 1.3  0.7 - 4.0 K/uL   Monocytes Relative 10  3 - 12 %   Monocytes Absolute 1.4 (*) 0.1 - 1.0 K/uL   Eosinophils Relative 2  0 - 5 %   Eosinophils Absolute 0.3  0.0 - 0.7 K/uL   Basophils Relative 0  0 - 1 %   Basophils Absolute 0.0  0.0 - 0.1 K/uL  BASIC METABOLIC PANEL     Status: Abnormal   Collection Time    08/23/14 12:37 PM      Result Value Ref Range   Sodium 138  137 - 147 mEq/L   Potassium 3.1 (*) 3.7 - 5.3 mEq/L   Chloride 100  96 - 112 mEq/L   CO2 20  19 - 32 mEq/L   Glucose, Bld 87  70 - 99 mg/dL   BUN 57 (*) 6 - 23 mg/dL   Creatinine, Ser 1.81 (*) 0.50 - 1.10 mg/dL   Calcium 9.0  8.4 - 10.5 mg/dL   GFR calc non Af Amer 26 (*) >90 mL/min   GFR calc Af Amer 30 (*) >90 mL/min   Comment: (NOTE)     The eGFR has been calculated using the CKD EPI equation.     This calculation has not been validated in all clinical situations.     eGFR's persistently <90 mL/min signify possible Chronic Kidney     Disease.   Anion gap 18 (*) 5 - 15  HEPATIC FUNCTION PANEL     Status: Abnormal   Collection Time     08/23/14 12:37 PM      Result Value Ref Range   Total Protein 6.7  6.0 - 8.3 g/dL   Albumin 2.2 (*) 3.5 - 5.2 g/dL   AST 89 (*) 0 - 37 U/L   ALT 56 (*) 0 - 35 U/L   Alkaline Phosphatase 342 (*) 39 - 117 U/L   Total Bilirubin 9.2 (*) 0.3 - 1.2 mg/dL   Bilirubin, Direct 7.5 (*) 0.0 - 0.3 mg/dL   Indirect Bilirubin 1.7 (*) 0.3 - 0.9 mg/dL  LIPASE, BLOOD     Status: Abnormal   Collection Time    08/23/14 12:37 PM      Result Value Ref Range   Lipase 810 (*) 11 - 59 U/L  I-STAT TROPOININ, ED     Status: None   Collection Time    08/23/14 12:52 PM      Result Value Ref Range   Troponin i, poc 0.01  0.00 - 0.08 ng/mL   Comment 3            Comment: Due to the release kinetics of cTnI,     a negative result within the first hours     of the onset of symptoms does not rule out     myocardial infarction with certainty.     If myocardial infarction is still suspected,     repeat the test at appropriate intervals.  PROTIME-INR  Status: Abnormal   Collection Time    08/23/14  2:15 PM      Result Value Ref Range   Prothrombin Time 21.5 (*) 11.6 - 15.2 seconds   INR 1.87 (*) 0.00 - 1.49  APTT     Status: Abnormal   Collection Time    08/23/14  2:15 PM      Result Value Ref Range   aPTT 42 (*) 24 - 37 seconds   Comment:            IF BASELINE aPTT IS ELEVATED,     SUGGEST PATIENT RISK ASSESSMENT     BE USED TO DETERMINE APPROPRIATE     ANTICOAGULANT THERAPY.  ACETAMINOPHEN LEVEL     Status: None   Collection Time    08/23/14  2:29 PM      Result Value Ref Range   Acetaminophen (Tylenol), Serum <15.0  10 - 30 ug/mL   Comment:            THERAPEUTIC CONCENTRATIONS VARY     SIGNIFICANTLY. A RANGE OF 10-30     ug/mL MAY BE AN EFFECTIVE     CONCENTRATION FOR MANY PATIENTS.     HOWEVER, SOME ARE BEST TREATED     AT CONCENTRATIONS OUTSIDE THIS     RANGE.     ACETAMINOPHEN CONCENTRATIONS     >150 ug/mL AT 4 HOURS AFTER     INGESTION AND >50 ug/mL AT 12     HOURS AFTER INGESTION  ARE     OFTEN ASSOCIATED WITH TOXIC     REACTIONS.  CBC     Status: Abnormal   Collection Time    08/24/14  6:20 AM      Result Value Ref Range   WBC 13.6 (*) 4.0 - 10.5 K/uL   RBC 3.30 (*) 3.87 - 5.11 MIL/uL   Hemoglobin 9.7 (*) 12.0 - 15.0 g/dL   HCT 29.1 (*) 36.0 - 46.0 %   MCV 88.2  78.0 - 100.0 fL   MCH 29.4  26.0 - 34.0 pg   MCHC 33.3  30.0 - 36.0 g/dL   RDW 14.4  11.5 - 15.5 %   Platelets 199  150 - 400 K/uL  HEPATIC FUNCTION PANEL     Status: Abnormal   Collection Time    08/24/14  6:20 AM      Result Value Ref Range   Total Protein 5.5 (*) 6.0 - 8.3 g/dL   Albumin 1.8 (*) 3.5 - 5.2 g/dL   AST 64 (*) 0 - 37 U/L   ALT 47 (*) 0 - 35 U/L   Alkaline Phosphatase 367 (*) 39 - 117 U/L   Total Bilirubin 8.5 (*) 0.3 - 1.2 mg/dL   Bilirubin, Direct 7.4 (*) 0.0 - 0.3 mg/dL   Indirect Bilirubin 1.1 (*) 0.3 - 0.9 mg/dL  BASIC METABOLIC PANEL     Status: Abnormal   Collection Time    08/24/14  6:20 AM      Result Value Ref Range   Sodium 141  137 - 147 mEq/L   Potassium 2.9 (*) 3.7 - 5.3 mEq/L   Comment: CRITICAL RESULT CALLED TO, READ BACK BY AND VERIFIED WITH:     ZELLNERRRN [id number] MCCAULEG   Chloride 108  96 - 112 mEq/L   CO2 16 (*) 19 - 32 mEq/L   Glucose, Bld 89  70 - 99 mg/dL   BUN 28 (*) 6 - 23 mg/dL   Creatinine, Ser 0.84  0.50 - 1.10 mg/dL   Calcium 8.0 (*) 8.4 - 10.5 mg/dL   GFR calc non Af Amer 66 (*) >90 mL/min   GFR calc Af Amer 77 (*) >90 mL/min   Comment: (NOTE)     The eGFR has been calculated using the CKD EPI equation.     This calculation has not been validated in all clinical situations.     eGFR's persistently <90 mL/min signify possible Chronic Kidney     Disease.   Anion gap 17 (*) 5 - 15  PROTIME-INR     Status: Abnormal   Collection Time    08/24/14  6:20 AM      Result Value Ref Range   Prothrombin Time 22.1 (*) 11.6 - 15.2 seconds   INR 1.93 (*) 0.00 - 1.49    Ct Abdomen Pelvis Wo Contrast  08/23/2014   CLINICAL DATA:  Anorexia.  Generalized weakness. Symptoms worse over the last 8 weeks. 15 lb weight loss. Frequent falls. Jaundice. No abdominal pain, nausea, vomiting.  EXAM: CT ABDOMEN AND PELVIS WITHOUT CONTRAST  TECHNIQUE: Multidetector CT imaging of the abdomen and pelvis was performed following the standard protocol without IV contrast.  COMPARISON:  CT of the abdomen and pelvis on 02/28/2012  FINDINGS: Lower chest: There is mild fibrosis at the lung bases which may be chronic. Atherosclerotic calcification is noted in the coronary vessels. There is a diaphragmatic node measuring 1.0 cm.  Upper abdomen: There is marked intrahepatic and extrahepatic biliary ductal dilatation. The common bile duct measures as large as 2.6 cm. A distal common bile duct hyperdense mass is faintly seen and measures 1.4 cm. This may represent a gallstone or soft tissue mass. The patient has had previous cholecystectomy. Given the lack of intravenous contrast, it would be difficult to exclude a liver lesion but none are seen. A probable hyperdense cyst is identified involving the midpole region right kidney and measures 1.6 cm. The left kidney has a normal appearance. The spleen has a normal appearance. Adrenal glands are unremarkable in appearance. No focal abnormality identified within the pancreas.  Bowel: The stomach and small bowel loops are normal in appearance. There is moderate sigmoid diverticulosis. Significant stool is identified throughout the colon. The appendix is well seen and has a normal appearance.  Pelvis: The uterus is surgically absent. The urinary bladder is distended. Suspect right adnexal mass/cyst measuring 1.7 cm. Left adnexal region has a normal appearance. There is no free pelvic fluid.  Retroperitoneum: There is atherosclerotic calcification of the aorta. No retroperitoneal or mesenteric adenopathy.  Abdominal wall: Unremarkable.  Osseous structures: Significant hardware from prior spinal fusion. No suspicious lytic or blastic  lesions.  IMPRESSION: 1. Marked biliary dilatation to the level of the distal common bile duct. Faint hyperdense mass measuring 1.4 cm seen in the distal common bile duct. Considerations include a stone or tumor. Further evaluation is warranted. Consider MRI/MRCP if the patient is able to tolerate MRI. Otherwise, consider ultrasound of the abdomen. 2. Probable right renal cyst. 3. Sigmoid diverticulosis. 4. Suspect right adnexal mass/cyst measuring 1.7 cm. This has benign characteristics. No further evaluation is felt to be necessary. This recommendation follows ACR consensus guidelines: White Paper of the ACR Incidental Findings Committee II on Adnexal Findings. J Am Coll Radiol [phone].   Electronically Signed   By: Shon Hale M.D.   On: 08/23/2014 16:54   Ct Head Wo Contrast  08/23/2014   CLINICAL DATA:  78 year old female with fall, weakness, head and neck  injury.  EXAM: CT HEAD WITHOUT CONTRAST  CT CERVICAL SPINE WITHOUT CONTRAST  TECHNIQUE: Multidetector CT imaging of the head and cervical spine was performed following the standard protocol without intravenous contrast. Multiplanar CT image reconstructions of the cervical spine were also generated.  COMPARISON:  03/28/2012 head CT  FINDINGS: CT HEAD FINDINGS  Minimal chronic small-vessel white matter ischemic changes again noted.  No acute intracranial abnormalities are identified, including mass lesion or mass effect, hydrocephalus, extra-axial fluid collection, midline shift, hemorrhage, or acute infarction. The visualized bony calvarium is unremarkable.  CT CERVICAL SPINE FINDINGS  There is no evidence of acute fracture or subluxation or prevertebral soft tissue swelling.  Multilevel degenerative disc disease, spondylosis and facet arthropathy identified with moderate degenerative disc disease from C5-C7.  The soft tissue structures are unremarkable.  IMPRESSION: No evidence of acute intracranial abnormality or static evidence of acute injury to  the cervical spine.  Degenerative changes within the cervical spine.   Electronically Signed   By: Hassan Rowan M.D.   On: 08/23/2014 16:47   Ct Cervical Spine Wo Contrast  08/23/2014   CLINICAL DATA:  78 year old female with fall, weakness, head and neck injury.  EXAM: CT HEAD WITHOUT CONTRAST  CT CERVICAL SPINE WITHOUT CONTRAST  TECHNIQUE: Multidetector CT imaging of the head and cervical spine was performed following the standard protocol without intravenous contrast. Multiplanar CT image reconstructions of the cervical spine were also generated.  COMPARISON:  03/28/2012 head CT  FINDINGS: CT HEAD FINDINGS  Minimal chronic small-vessel white matter ischemic changes again noted.  No acute intracranial abnormalities are identified, including mass lesion or mass effect, hydrocephalus, extra-axial fluid collection, midline shift, hemorrhage, or acute infarction. The visualized bony calvarium is unremarkable.  CT CERVICAL SPINE FINDINGS  There is no evidence of acute fracture or subluxation or prevertebral soft tissue swelling.  Multilevel degenerative disc disease, spondylosis and facet arthropathy identified with moderate degenerative disc disease from C5-C7.  The soft tissue structures are unremarkable.  IMPRESSION: No evidence of acute intracranial abnormality or static evidence of acute injury to the cervical spine.  Degenerative changes within the cervical spine.   Electronically Signed   By: Hassan Rowan M.D.   On: 08/23/2014 16:47   US Abdomen Complete  08/23/2014   CLINICAL DATA:  78 year old female with jaundice. History of cholecystectomy.  EXAM: ULTRASOUND ABDOMEN COMPLETE  COMPARISON:  02/28/2012 CT  FINDINGS: Gallbladder:  Gallbladder is not visualized compatible with cholecystectomy.  Common bile duct:  Diameter: 18.6 mm. Intrahepatic biliary dilatation is identified. An obstructing cause is not identified on this study but the mid and distal CBD are not well visualized.  Liver:  No focal lesion  identified. Within normal limits in parenchymal echogenicity.  IVC:  No abnormality visualized.  Pancreas:  The head and tail of the pancreas are difficult to visualize. Pancreatic ductal dilatation is noted.  Spleen:  Size and appearance within normal limits.  Right Kidney:  Length: 12.1 cm. Echogenicity is upper limits of normal. No mass or hydronephrosis visualized. Multiple cysts are identified.  Left Kidney:  Length: 11.7 cm. Echogenicity is upper limits of normal. No mass or hydronephrosis visualized.  Abdominal aorta:  No aneurysm visualized.  Other findings:  None.  IMPRESSION: CBD and intrahepatic biliary dilatation and pancreatic ductal dilatation without identifiable obstructing cause on this study. The mid and distal CBD as well as the pancreatic head are not well visualized. Recommend further evaluation.  Upper limits of normal renal echogenicity which may be seen  with medical renal disease.  Cholecystectomy.   Electronically Signed   By: Hassan Rowan M.D.   On: 08/23/2014 16:26   Dg Chest Port 1 View  08/23/2014   CLINICAL DATA:  SOB.  Weakness, jaundice, hypertension.  EXAM: PORTABLE CHEST - 1 VIEW  COMPARISON:  04/28/2007  FINDINGS: Shallow lung inflation. The heart is enlarged. There is perihilar peribronchial thickening. No focal consolidations or pleural effusions. No pulmonary edema. The patient has had previous left clavicle ORIF an left shoulder arthroplasty. Patient has had posterior thoracolumbar fusion. Surgical clips are noted in the right upper quadrant of the abdomen.  IMPRESSION: 1. Shallow inflation. 2. Bronchitic changes. 3.  No focal acute pulmonary abnormality.   Electronically Signed   By: Shon Hale M.D.   On: 08/23/2014 13:32   ROS: Constitutional: weakness, chills, anorexia and weight loss. HEENT: has chronic dental problems no headache or neck pain  Cardiovascular: no chest pain Respiratory: negative GI: has history of dysphagia and reflux requiring dilatation in the past  history of colon polyps denies abdominal pain GU: no dysuria Musculoskeletal: weak and unable to get around the house Neuro/Psychiatric: family feels that she has been somewhat depressed Endocrine/Heme:            Blood pressure 146/58, pulse 94, temperature 99.9 F (37.7 C), temperature source Oral, resp. rate 25, height _0  (1.549 m), weight 63.05 kg (139 lb), SpO2 97.00%.  Physical exam:   General-- frail appearing white female Eyes-- sclera are enteric Heart-- normal as one has to without murmurs are gallops Lungs--clear Abdomen-- none distended in soft with good bowel sounds and nontender   Assessment: 1. Biliary obstruction. CT done without IV contrast to the azotemia. Appears that there is a mass of the distal bile duct appears to be soft tissue but cannot rule out a common duct stone. 2. Anorexia and Weight Loss. This does appear to be more consistent with the tumor and with a stone. 3. Painless jaundice. 4. Status post cholecystectomy 5. Possible cholangitis. Patient on antibiotics 6 Coagulopathy. Probably due to biliary obstruction she is not in any anticoagulants  Plan: 1. We will go ahead and obtain MRCP to try to determine if this is a stone or a soft tissue mass. If this is a mass she will likely need EUS in addition to ERCP stent. Will need to obtain tissue that is the case. That appears to be simply a stone ERCP sphincterotomy etc. would be performed. 2. Continue antibiotics for now 3. Will need to correct coags before any procedures would recommend some IV vitamin K. Have discussed this extensively with the patient and her family.   Jaskirat Zertuche JR,Eunice Winecoff L 08/24/2014, 12:13 PM

## 2014-08-24 NOTE — Evaluation (Signed)
Physical Therapy Evaluation Patient Details Name: [name] [name] MRN: [id number] DOB: [birth year] Today's Date: 08/24/2014   History of Present Illness  Patient is a 78 y/o female admitted with weakness, yellowish discoloration of skin, nausea and vomiting for 6 weeks. Pt had a fall with a fractured clavicle about 6 weeks ago and underwent open reduction and internal fixation 8/6. In ED, pt markedly jaundiced with a bilirubin of almost 9, a CT scan of the abdomen without contrast showed a possible distal CBD lesion with both extrahepatic intrahepatic ductal dilatation and 1.4 cm mass located near the junction of the common bile duct and the pancreatic duct. Workup pending.   Clinical Impression  Patient presents with increased pain in BLEs during gait training, balance deficits and generalized weakness putting pt at increased risk for falls. Pt required encouragement to participate in therapy. Tolerated short distance ambulation with extremely slow gait speed. Fatigues easily. Poor activity tolerance. [facility]iscussed [facility] of [facility] however pt plans on going home. TBD pending progress. Pt would benefit from acute PT for transfers, gait, balance and overall safe mobility so pt can maximize independence and decrease fall risk prior to discharge home.    Follow Up Recommendations Home health PT;Supervision/Assistance - 24 hour    Equipment Recommendations  None recommended by PT    Recommendations for Other Services OT consult     Precautions / Restrictions Precautions Precautions: Fall Restrictions Weight Bearing Restrictions: No      Mobility  Bed Mobility   Bed Mobility: Sit to Supine       Sit to supine: Mod assist   General bed mobility comments: Sitting EOB upon PT arrival. Required assist mobilizing BLEs into bed and assist with repositioning. HOB elevated, use of rails.  Transfers Overall transfer level: Needs assistance Equipment used: Rolling walker (2 wheeled) Transfers: Sit  to/from Stand Sit to Stand: Mod assist         General transfer comment: VC for hand placement, anterior weight shift and safety. Increased time and difficulty. VC for upright as pt in increased forward flexion.  Ambulation/Gait Ambulation/Gait assistance: Min assist Ambulation Distance (Feet): 8 Feet Assistive device: Rolling walker (2 wheeled) Gait Pattern/deviations: Step-through pattern Gait velocity: Very slow gait speed. Gait velocity interpretation: Below normal speed for age/gender General Gait Details: Pt with slow gait speed with difficulty advancing BLEs. Assist for weight shifting. Notable trembling in BUEs due to increased WB with stepping. Fatigues easily. Increased pain in BLEs. VC for posture and RW management.  Stairs            Wheelchair Mobility    Modified Rankin (Stroke Patients Only)       Balance Overall balance assessment: Needs assistance   Sitting balance-Leahy Scale: Fair     Standing balance support: During functional activity Standing balance-Leahy Scale: Poor                               Pertinent Vitals/Pain Pain Assessment: 0-10 Pain Score: 7  Pain Location: BLEs. Pain Descriptors / Indicators: Aching;Sore;Guarding Pain Intervention(s): Limited activity within patient's tolerance;Monitored during session;Repositioned    Home Living Family/patient expects to be discharged to:: Private residence Living Arrangements: Spouse/significant other Available Help at Discharge: Available 24 hours/day Type of Home: House Home Access: Stairs to enter Entrance Stairs-Rails: None Entrance Stairs-Number of Steps: 1 Home Layout: Bed/bath upstairs;Two level Home Equipment: Walker - 2 wheels;Cane - single point;Bedside commode  Prior Function Level of Independence: Independent         Comments: Pt independent for ADLs, IADLs, drives and does pilates for an hour weekly. Pt reports she got so weak her husband was picking  her up and carrying her to the bathroom.     Hand Dominance   Dominant Hand: Right    Extremity/Trunk Assessment   Upper Extremity Assessment: Generalized weakness (Pain with LUE movement due to recent clavicle fx.)           Lower Extremity Assessment: Generalized weakness         Communication   Communication: No difficulties  Cognition Arousal/Alertness: Awake/alert Behavior During Therapy: WFL for tasks assessed/performed Overall Cognitive Status: Within Functional Limits for tasks assessed                      General Comments General comments (skin integrity, edema, etc.): Pt jaundice all over with yellowish tint in eyes.    Exercises General Exercises - Lower Extremity Ankle Circles/Pumps: Both;10 reps;Seated Long Arc Quad: Both;10 reps;Seated      Assessment/Plan    PT Assessment Patient needs continued PT services  PT Diagnosis Difficulty walking;Generalized weakness;Acute pain   PT Problem List Decreased strength;Pain;Decreased activity tolerance;Decreased balance;Decreased mobility;Decreased safety awareness  PT Treatment Interventions DME instruction;Balance training;Gait training;Patient/family education;Functional mobility training;Therapeutic activities;Therapeutic exercise   PT Goals (Current goals can be found in the Care Plan section) Acute Rehab PT Goals Patient Stated Goal: to get better and get home PT Goal Formulation: With patient Time For Goal Achievement: 09/07/14 Potential to Achieve Goals: Good    Frequency Min 3X/week   Barriers to discharge        Co-evaluation               End of Session Equipment Utilized During Treatment: Gait belt Activity Tolerance: Patient limited by fatigue Patient left: in bed;with call bell/phone within reach;with nursing/sitter in room Nurse Communication: Mobility status         Time: 1308-6578 PT Time Calculation (min): 25 min   Charges:   PT Evaluation $Initial PT  Evaluation Tier I: 1 Procedure PT Treatments $Gait Training: 8-22 mins   PT G CodesCandy Sledge A 08/24/2014, 3:39 PM Candy Sledge, [place], DPT [phone]

## 2014-08-24 NOTE — Progress Notes (Signed)
PATIENT DETAILS Name: [name] [name] Age: 78 y.o. Sex: female Date of Birth: May 22, 1936 Admit Date: 08/23/2014 Admitting Physician Evalee Mutton Kristeen Mans, MD FOY:DXAJOIN,OMVEH [name], MD  Subjective: Essentially unchanged. Developed Diarrhea overnight  Assessment/Plan: Principal Problem: Obstructive jaundice  - Painless jaundice - CT scan of the abdomen without contrast - shows a distal CBD lesion-patient is status post cholecystectomy (done in the 1990s) -not sure whether this is malignant lesion or secondary to choledocholithiasis -GI consulted-recommendations are for MRCP to further delineate pathology-before deciding on further course of action.  Active Problems: Suspected Acute cholangitis  - Given history of chills, leukocytosis-suspect some amount of cholangitis started empirically on Zosyn-day 2. Remains afebrile since admission, leukocytosis down trending.Unfortunately developed diarrhea overnight-C Diff PCR positive-will start Flagyl, but given risk of Cholangitis-will continue with Zosyn. Blood Cultures on 9/11-pending  C Diff Colitis -developed diarrhea post admission. Suspect patient may have received prophylactic antibiotics during her 2 most recent admissions. C Diff PCR positive on 9/12 -start Flagyl-day 1 -will extend treatment by 1 week following completion of antibiotics for presumed cholangitis  ARF (acute renal failure)  - Likely prerenal, given poor oral intake, use of diuretics and acute illness. -resolved with IVF  Hypokalemia -secondary to diarrhea/diuretics -replete and recheck in am  Coagulopathy -secondary to obstructive jaundice -will give Vit K today as well-recheck INR in am  Hypertension -controlled with Atenolol-at half home dose. Diuretics remain on hold  IBS (irritable bowel syndrome)  - per history-diarrhea predominant irritable bowel syndrome - given acute illness, elevated LFTs-will hold all home medications for now. Resume when able     Frequent falls  - Likely secondary to generalized weakness from acute illness/dehydration. Hydrate, treat underlying acute illness, PT evaluation. CT of the head and CT of the cervical spine negative for acute abnormalities.  GERD (gastroesophageal reflux disease)  - Stable, continue PPI.   Anxiety  - On chronic Xanax at home-will continue   Disposition: Remain inpatient  DVT Prophylaxis:  SCD's  Code Status: Full code   Family Communication Husband on admission  Procedures:  NONE  CONSULTS:  GI  Time spent 40 minutes-which includes 50% of the time with face-to-face with patient/ family and coordinating care related to the above assessment and plan.  MEDICATIONS: Scheduled Meds: . ALPRAZolam  1 mg Oral QID  . atenolol  25 mg Oral Q breakfast  . levothyroxine  100 mcg Oral QAC breakfast  . pantoprazole (PROTONIX) IV  40 mg Intravenous Q24H  . piperacillin-tazobactam (ZOSYN)  IV  3.375 g Intravenous 3 times per day  . potassium chloride  10 mEq Intravenous Q1 Hr x 4   Continuous Infusions: . sodium chloride 125 mL/hr at 08/24/14 0440   PRN Meds:.acetaminophen, acetaminophen, albuterol, guaiFENesin-dextromethorphan, labetalol, LORazepam, morphine injection, ondansetron (ZOFRAN) IV, ondansetron, oxyCODONE, zolpidem  Antibiotics: Anti-infectives   Start     Dose/Rate Route Frequency Ordered Stop   08/23/14 1845  piperacillin-tazobactam (ZOSYN) IVPB 3.375 g     3.375 g 12.5 mL/hr over 240 Minutes Intravenous 3 times per day 08/23/14 1836         PHYSICAL EXAM: Vital signs in last 24 hours: Filed Vitals:   08/23/14 1830 08/23/14 1929 08/24/14 0702 08/24/14 1326  BP: 114/78 153/65 146/58 138/56  Pulse: 81 81 94 63  Temp:  98.6 F (37 C) 99.9 F (37.7 C) 97.9 F (36.6 C)  TempSrc:  Oral Oral Oral  Resp: 21 16 25 20   Height:  5'  1" (1.549 m)    Weight:  63.05 kg (139 lb)    SpO2: 97% 100% 97% 98%    Weight change:  Filed Weights   08/23/14 1156  08/23/14 1929  Weight: 59.421 kg (131 lb) 63.05 kg (139 lb)   Body mass index is 26.28 kg/(m^2).   Gen Exam: Awake and alert with clear speech.  +Grossly icteric Neck: Supple, No JVD.   Chest: B/L Clear.   CVS: S1 S2 Regular, no murmurs.  Abdomen: soft, BS +, non tender, non distended.  Extremities: no edema, lower extremities warm to touch. Neurologic: Non Focal.   Skin: No Rash.   Wounds: N/A.   Intake/Output from previous day:  Intake/Output Summary (Last 24 hours) at 08/24/14 1346 Last data filed at 08/24/14 0929  Gross per 24 hour  Intake 1493.75 ml  Output      0 ml  Net 1493.75 ml     LAB RESULTS: CBC  Recent Labs Lab 08/23/14 1237 08/24/14 0620  WBC 14.2* 13.6*  HGB 10.9* 9.7*  HCT 32.4* 29.1*  PLT 197 199  MCV 88.5 88.2  MCH 29.8 29.4  MCHC 33.6 33.3  RDW 14.3 14.4  LYMPHSABS 1.3  --   MONOABS 1.4*  --   EOSABS 0.3  --   BASOSABS 0.0  --     Chemistries   Recent Labs Lab 08/23/14 1237 08/24/14 0620  NA 138 141  K 3.1* 2.9*  CL 100 108  CO2 20 16*  GLUCOSE 87 89  BUN 57* 28*  CREATININE 1.81* 0.84  CALCIUM 9.0 8.0*    CBG: No results found for this basename: GLUCAP,  in the last 168 hours  GFR Estimated Creatinine Clearance: 49.2 ml/min (by C-G formula based on Cr of 0.84).  Coagulation profile  Recent Labs Lab 08/23/14 1415 08/24/14 0620  INR 1.87* 1.93*    Cardiac Enzymes No results found for this basename: CK, CKMB, TROPONINI, MYOGLOBIN,  in the last 168 hours  No components found with this basename: POCBNP,  No results found for this basename: DDIMER,  in the last 72 hours No results found for this basename: HGBA1C,  in the last 72 hours No results found for this basename: CHOL, HDL, LDLCALC, TRIG, CHOLHDL, LDLDIRECT,  in the last 72 hours No results found for this basename: TSH, T4TOTAL, FREET3, T3FREE, THYROIDAB,  in the last 72 hours No results found for this basename: VITAMINB12, FOLATE, FERRITIN, TIBC, IRON,  RETICCTPCT,  in the last 72 hours  Recent Labs  08/23/14 1237  LIPASE 810*    Urine Studies No results found for this basename: UACOL, UAPR, USPG, UPH, UTP, UGL, UKET, UBIL, UHGB, UNIT, UROB, ULEU, UEPI, UWBC, URBC, UBAC, CAST, CRYS, UCOM, BILUA,  in the last 72 hours  MICROBIOLOGY: Recent Results (from the past 240 hour(s))  CLOSTRIDIUM DIFFICILE BY PCR     Status: Abnormal   Collection Time    08/24/14  2:51 AM      Result Value Ref Range Status   C difficile by pcr POSITIVE (*) NEGATIVE Final   Comment: CRITICAL RESULT CALLED TO, READ BACK BY AND VERIFIED WITH:     ZELLNER R.,RN 08/24/14 1231 BY JONESJ    RADIOLOGY STUDIES/RESULTS: Ct Abdomen Pelvis Wo Contrast  08/23/2014   CLINICAL DATA:  Anorexia. Generalized weakness. Symptoms worse over the last 8 weeks. 15 lb weight loss. Frequent falls. Jaundice. No abdominal pain, nausea, vomiting.  EXAM: CT ABDOMEN AND PELVIS WITHOUT CONTRAST  TECHNIQUE: Multidetector CT  imaging of the abdomen and pelvis was performed following the standard protocol without IV contrast.  COMPARISON:  CT of the abdomen and pelvis on 02/28/2012  FINDINGS: Lower chest: There is mild fibrosis at the lung bases which may be chronic. Atherosclerotic calcification is noted in the coronary vessels. There is a diaphragmatic node measuring 1.0 cm.  Upper abdomen: There is marked intrahepatic and extrahepatic biliary ductal dilatation. The common bile duct measures as large as 2.6 cm. A distal common bile duct hyperdense mass is faintly seen and measures 1.4 cm. This may represent a gallstone or soft tissue mass. The patient has had previous cholecystectomy. Given the lack of intravenous contrast, it would be difficult to exclude a liver lesion but none are seen. A probable hyperdense cyst is identified involving the midpole region right kidney and measures 1.6 cm. The left kidney has a normal appearance. The spleen has a normal appearance. Adrenal glands are unremarkable in  appearance. No focal abnormality identified within the pancreas.  Bowel: The stomach and small bowel loops are normal in appearance. There is moderate sigmoid diverticulosis. Significant stool is identified throughout the colon. The appendix is well seen and has a normal appearance.  Pelvis: The uterus is surgically absent. The urinary bladder is distended. Suspect right adnexal mass/cyst measuring 1.7 cm. Left adnexal region has a normal appearance. There is no free pelvic fluid.  Retroperitoneum: There is atherosclerotic calcification of the aorta. No retroperitoneal or mesenteric adenopathy.  Abdominal wall: Unremarkable.  Osseous structures: Significant hardware from prior spinal fusion. No suspicious lytic or blastic lesions.  IMPRESSION: 1. Marked biliary dilatation to the level of the distal common bile duct. Faint hyperdense mass measuring 1.4 cm seen in the distal common bile duct. Considerations include a stone or tumor. Further evaluation is warranted. Consider MRI/MRCP if the patient is able to tolerate MRI. Otherwise, consider ultrasound of the abdomen. 2. Probable right renal cyst. 3. Sigmoid diverticulosis. 4. Suspect right adnexal mass/cyst measuring 1.7 cm. This has benign characteristics. No further evaluation is felt to be necessary. This recommendation follows ACR consensus guidelines: White Paper of the ACR Incidental Findings Committee II on Adnexal Findings. J Am Coll Radiol [phone].   Electronically Signed   By: Shon Hale M.D.   On: 08/23/2014 16:54   Ct Head Wo Contrast  08/23/2014   CLINICAL DATA:  78 year old female with fall, weakness, head and neck injury.  EXAM: CT HEAD WITHOUT CONTRAST  CT CERVICAL SPINE WITHOUT CONTRAST  TECHNIQUE: Multidetector CT imaging of the head and cervical spine was performed following the standard protocol without intravenous contrast. Multiplanar CT image reconstructions of the cervical spine were also generated.  COMPARISON:  03/28/2012 head CT   FINDINGS: CT HEAD FINDINGS  Minimal chronic small-vessel white matter ischemic changes again noted.  No acute intracranial abnormalities are identified, including mass lesion or mass effect, hydrocephalus, extra-axial fluid collection, midline shift, hemorrhage, or acute infarction. The visualized bony calvarium is unremarkable.  CT CERVICAL SPINE FINDINGS  There is no evidence of acute fracture or subluxation or prevertebral soft tissue swelling.  Multilevel degenerative disc disease, spondylosis and facet arthropathy identified with moderate degenerative disc disease from C5-C7.  The soft tissue structures are unremarkable.  IMPRESSION: No evidence of acute intracranial abnormality or static evidence of acute injury to the cervical spine.  Degenerative changes within the cervical spine.   Electronically Signed   By: Hassan Rowan M.D.   On: 08/23/2014 16:47   Ct Cervical Spine Wo Contrast  08/23/2014   CLINICAL DATA:  78 year old female with fall, weakness, head and neck injury.  EXAM: CT HEAD WITHOUT CONTRAST  CT CERVICAL SPINE WITHOUT CONTRAST  TECHNIQUE: Multidetector CT imaging of the head and cervical spine was performed following the standard protocol without intravenous contrast. Multiplanar CT image reconstructions of the cervical spine were also generated.  COMPARISON:  03/28/2012 head CT  FINDINGS: CT HEAD FINDINGS  Minimal chronic small-vessel white matter ischemic changes again noted.  No acute intracranial abnormalities are identified, including mass lesion or mass effect, hydrocephalus, extra-axial fluid collection, midline shift, hemorrhage, or acute infarction. The visualized bony calvarium is unremarkable.  CT CERVICAL SPINE FINDINGS  There is no evidence of acute fracture or subluxation or prevertebral soft tissue swelling.  Multilevel degenerative disc disease, spondylosis and facet arthropathy identified with moderate degenerative disc disease from C5-C7.  The soft tissue structures are  unremarkable.  IMPRESSION: No evidence of acute intracranial abnormality or static evidence of acute injury to the cervical spine.  Degenerative changes within the cervical spine.   Electronically Signed   By: Hassan Rowan M.D.   On: 08/23/2014 16:47   US Abdomen Complete  08/23/2014   CLINICAL DATA:  78 year old female with jaundice. History of cholecystectomy.  EXAM: ULTRASOUND ABDOMEN COMPLETE  COMPARISON:  02/28/2012 CT  FINDINGS: Gallbladder:  Gallbladder is not visualized compatible with cholecystectomy.  Common bile duct:  Diameter: 18.6 mm. Intrahepatic biliary dilatation is identified. An obstructing cause is not identified on this study but the mid and distal CBD are not well visualized.  Liver:  No focal lesion identified. Within normal limits in parenchymal echogenicity.  IVC:  No abnormality visualized.  Pancreas:  The head and tail of the pancreas are difficult to visualize. Pancreatic ductal dilatation is noted.  Spleen:  Size and appearance within normal limits.  Right Kidney:  Length: 12.1 cm. Echogenicity is upper limits of normal. No mass or hydronephrosis visualized. Multiple cysts are identified.  Left Kidney:  Length: 11.7 cm. Echogenicity is upper limits of normal. No mass or hydronephrosis visualized.  Abdominal aorta:  No aneurysm visualized.  Other findings:  None.  IMPRESSION: CBD and intrahepatic biliary dilatation and pancreatic ductal dilatation without identifiable obstructing cause on this study. The mid and distal CBD as well as the pancreatic head are not well visualized. Recommend further evaluation.  Upper limits of normal renal echogenicity which may be seen with medical renal disease.  Cholecystectomy.   Electronically Signed   By: Hassan Rowan M.D.   On: 08/23/2014 16:26   Dg Chest Port 1 View  08/23/2014   CLINICAL DATA:  SOB.  Weakness, jaundice, hypertension.  EXAM: PORTABLE CHEST - 1 VIEW  COMPARISON:  04/28/2007  FINDINGS: Shallow lung inflation. The heart is enlarged. There  is perihilar peribronchial thickening. No focal consolidations or pleural effusions. No pulmonary edema. The patient has had previous left clavicle ORIF an left shoulder arthroplasty. Patient has had posterior thoracolumbar fusion. Surgical clips are noted in the right upper quadrant of the abdomen.  IMPRESSION: 1. Shallow inflation. 2. Bronchitic changes. 3.  No focal acute pulmonary abnormality.   Electronically Signed   By: Shon Hale M.D.   On: 08/23/2014 13:32    Oren Binet, MD  [facility] Pager:[phone]  If 7PM-7AM, please contact night-coverage [email or web address] Password [email or web address] 08/24/2014, 1:46 PM   LOS: 1 day   **Disclaimer: This note may have been dictated with voice recognition software. Similar sounding words can inadvertently be transcribed and this note may  contain transcription errors which may not have been corrected upon publication of note.**

## 2014-08-24 NOTE — Progress Notes (Addendum)
INITIAL NUTRITION ASSESSMENT  DOCUMENTATION CODES Per approved criteria  -Severe malnutrition in the context of acute illness or injury   INTERVENTION: Magic cup TID with meals, each supplement provides 290 kcal and 9 grams of protein RD to follow for nutrition care plan  NUTRITION DIAGNOSIS: Inadequate oral intake related to altered GI function as evidenced by patient report  Goal: Pt to meet >/= 90% of their estimated nutrition needs   Monitor:  PO intake, weight, labs, I/O's  Reason for Assessment: Malnutrition Screening Tool Report  78 y.o. female  Admitting Dx: Obstructive jaundice  ASSESSMENT: 78 y.o. Female with PMH of HTN, irritable bowel syndrome, hypothyroidism, gastroesophageal reflux disease who presented with weakness, yellowish discoloration of skin, nausea and vomiting.  In the ED, she was found to be markedly jaundiced with a bilirubin of almost 9, a CT scan of the abdomen without contrast showed a possible distal CBD lesion with both extrahepatic intrahepatic ductal dilatation.   RD spoke with patient at bedside; reports her appetite has been decreased for the past 6 weeks; she was consuming on average 2 meals per day (ie [employer]), however, for the last week has only been consuming ice cream; also endorses an 18 lb weight loss in the past </= 4 weeks; amenable to [employer] supplement; RD to order.  Patient meets criteria for severe malnutrition in the context of acute illness as evidenced by < 50% intake of estimated energy requirement for > 5 days and 11% weight loss x 1 month.  Height: Ht Readings from Last 1 Encounters:  08/23/14 5\' 1"  (1.549 m)    Weight: Wt Readings from Last 1 Encounters:  08/23/14 139 lb (63.05 kg)    Ideal Body Weight: 105 lb  % Ideal Body Weight: 132%  Wt Readings from Last 10 Encounters:  08/23/14 139 lb (63.05 kg)  07/18/14 137 lb (62.143 kg)  07/18/14 137 lb (62.143 kg)  07/12/12 131 lb 6.4 oz (59.603 kg)   06/08/12 136 lb (61.689 kg)  06/08/12 136 lb (61.689 kg)  06/01/12 137 lb 11.2 oz (62.46 kg)  04/18/12 138 lb 3.2 oz (62.687 kg)    Usual Body Weight: 137 lb  % Usual Body Weight: 101%  BMI:  Body mass index is 26.28 kg/(m^2).  Estimated Nutritional Needs: Kcal: 1700-1900 Protein: 80-90 gm Fluid: 1.7-1.9 L  Skin: Intact  Diet Order: Full Liquid  EDUCATION NEEDS: -No education needs identified at this time   Intake/Output Summary (Last 24 hours) at 08/24/14 1444 Last data filed at 08/24/14 0929  Gross per 24 hour  Intake 1493.75 ml  Output      0 ml  Net 1493.75 ml    Labs:   Recent Labs Lab 08/23/14 1237 08/24/14 0620  NA 138 141  K 3.1* 2.9*  CL 100 108  CO2 20 16*  BUN 57* 28*  CREATININE 1.81* 0.84  CALCIUM 9.0 8.0*  GLUCOSE 87 89    Scheduled Meds: . ALPRAZolam  1 mg Oral QID  . atenolol  25 mg Oral Q breakfast  . levothyroxine  100 mcg Oral QAC breakfast  . metroNIDAZOLE  500 mg Oral 3 times per day  . pantoprazole (PROTONIX) IV  40 mg Intravenous Q24H  . phytonadione (VITAMIN K) IV  5 mg Intravenous Once  . piperacillin-tazobactam (ZOSYN)  IV  3.375 g Intravenous 3 times per day  . potassium chloride  10 mEq Intravenous Q1 Hr x 4    Continuous Infusions: . sodium chloride 0.9 %  1,000 mL with potassium chloride 10 mEq infusion      Past Medical History  Diagnosis Date  . Hypertension   . Hyperlipidemia   . Thyroid disease   . GERD (gastroesophageal reflux disease)   . Anxiety   . IBS (irritable bowel syndrome)   . Back pain   . Blood transfusion     at age of 4  . Hypothyroidism   . H/O hiatal hernia   . Anemia     hx of years ago   . Arthritis     psoriatic arthritis  . ARF (acute renal failure) 08/23/2014    Past Surgical History  Procedure Laterality Date  . Tonsillectomy    . Ventral hernia repair  06/08/2012    Procedure: LAPAROSCOPIC VENTRAL HERNIA;  Surgeon: Adin Hector, MD;  Location: [facility];  Service: General;   Laterality: Right;  . Back surgery  2010    thoractic-screws and rods  . Joint replacement  2005    left shoulder replacement   . Cholecystectomy  1988    lap  . Abdominal hysterectomy      partial  . Colonoscopy    . Ercp    . Orif clavicular fracture Left 07/18/2014    Procedure: OPEN REDUCTION INTERNAL FIXATION (ORIF) LEFT CLAVICULAR FRACTURE;  Surgeon: Ninetta Lights, MD;  Location: [place];  Service: Orthopedics;  Laterality: Left;    Arthur Holms, RD, LDN Pager #: [phone] After-Hours Pager #: [phone]

## 2014-08-24 NOTE — Progress Notes (Signed)
08/24/14 Lab called with positive results of PCR C-Diff.

## 2014-08-25 ENCOUNTER — Inpatient Hospital Stay (HOSPITAL_COMMUNITY): Payer: Medicare Other

## 2014-08-25 DIAGNOSIS — A0472 Enterocolitis due to Clostridium difficile, not specified as recurrent: Secondary | ICD-10-CM

## 2014-08-25 LAB — LIPASE, BLOOD: Lipase: 51 U/L (ref 11–59)

## 2014-08-25 LAB — HEPATIC FUNCTION PANEL
ALK PHOS: 345 U/L — AB (ref 39–117)
ALT: 40 U/L — ABNORMAL HIGH (ref 0–35)
AST: 37 U/L (ref 0–37)
Albumin: 1.7 g/dL — ABNORMAL LOW (ref 3.5–5.2)
BILIRUBIN DIRECT: 7.5 mg/dL — AB (ref 0.0–0.3)
BILIRUBIN TOTAL: 9.6 mg/dL — AB (ref 0.3–1.2)
Indirect Bilirubin: 2.1 mg/dL — ABNORMAL HIGH (ref 0.3–0.9)
Total Protein: 5.3 g/dL — ABNORMAL LOW (ref 6.0–8.3)

## 2014-08-25 LAB — BASIC METABOLIC PANEL
Anion gap: 13 (ref 5–15)
BUN: 19 mg/dL (ref 6–23)
CO2: 19 mEq/L (ref 19–32)
CREATININE: 0.67 mg/dL (ref 0.50–1.10)
Calcium: 8.5 mg/dL (ref 8.4–10.5)
Chloride: 111 mEq/L (ref 96–112)
GFR calc Af Amer: >90 mL/min (ref >90)
GFR, EST NON AFRICAN AMERICAN: 84 mL/min — AB (ref >90)
GLUCOSE: 94 mg/dL (ref 70–99)
Potassium: 3.3 mEq/L — ABNORMAL LOW (ref 3.7–5.3)
Sodium: 143 mEq/L (ref 137–147)

## 2014-08-25 LAB — CBC
HCT: 28.3 % — ABNORMAL LOW (ref 36.0–46.0)
Hemoglobin: 9.4 g/dL — ABNORMAL LOW (ref 12.0–15.0)
MCH: 29.3 pg (ref 26.0–34.0)
MCHC: 33.2 g/dL (ref 30.0–36.0)
MCV: 88.2 fL (ref 78.0–100.0)
PLATELETS: 214 10*3/uL (ref 150–400)
RBC: 3.21 MIL/uL — ABNORMAL LOW (ref 3.87–5.11)
RDW: 14.4 % (ref 11.5–15.5)
WBC: 9.2 10*3/uL (ref 4.0–10.5)

## 2014-08-25 LAB — CEA: CEA: 3.8 ng/mL (ref 0.0–5.0)

## 2014-08-25 LAB — PROTIME-INR
INR: 1.11 (ref 0.00–1.49)
Prothrombin Time: 14.3 seconds (ref 11.6–15.2)

## 2014-08-25 LAB — CANCER ANTIGEN 19-9: CA 19 9: 2.6 U/mL — AB (ref <35.0)

## 2014-08-25 MED ORDER — POTASSIUM CHLORIDE 10 MEQ/100ML IV SOLN
10.0000 meq | INTRAVENOUS | Status: AC
  Administered 2014-08-25 (×2): 10 meq via INTRAVENOUS
  Filled 2014-08-25 (×2): qty 100

## 2014-08-25 MED ORDER — SODIUM CHLORIDE 0.9 % IV SOLN
INTRAVENOUS | Status: DC
  Administered 2014-08-25 (×2): via INTRAVENOUS

## 2014-08-25 MED ORDER — ATENOLOL 50 MG PO TABS
50.0000 mg | ORAL_TABLET | Freq: Every day | ORAL | Status: DC
  Administered 2014-08-26 – 2014-08-28 (×3): 50 mg via ORAL
  Filled 2014-08-25 (×4): qty 1

## 2014-08-25 MED ORDER — GADOBENATE DIMEGLUMINE 529 MG/ML IV SOLN
13.0000 mL | Freq: Once | INTRAVENOUS | Status: AC | PRN
  Administered 2014-08-25: 13 mL via INTRAVENOUS

## 2014-08-25 NOTE — Progress Notes (Signed)
[facility] PROGRESS NOTE Subjective Pt feels better but "sick". + for c diff but no real diarrhea per staff. MRCP pending.   Objective: Vital signs in last 24 hours: Temp:  [97.9 F (36.6 C)-98.4 F (36.9 C)] 98.4 F (36.9 C) (09/13 0553) Pulse Rate:  [61-68] 68 (09/13 0553) Resp:  [17-20] 17 (09/13 0553) BP: (138-166)/(47-69) 166/69 mmHg (09/13 0553) SpO2:  [97 %-98 %] 97 % (09/13 0553) Last BM Date: 08/24/14  Intake/Output from previous day: 09/12 0701 - 09/13 0700 In: 1880 [P.O.:120; I.V.:1660; IV Piggyback:100] Out: -  Intake/Output this shift:    PE: General--alert, looks better, more talkative Heart--normal s1s2 Lungs--clear Abdomen--completely soft and nontender  Lab Results:  Recent Labs  08/23/14 1237 08/24/14 0620  WBC 14.2* 13.6*  HGB 10.9* 9.7*  HCT 32.4* 29.1*  PLT 197 199   BMET  Recent Labs  08/23/14 1237 08/24/14 0620  NA 138 141  K 3.1* 2.9*  CL 100 108  CO2 20 16*  CREATININE 1.81* 0.84   LFT  Recent Labs  08/23/14 1237 08/24/14 0620  PROT 6.7 5.5*  AST 89* 64*  ALT 56* 47*  ALKPHOS 342* 367*  BILITOT 9.2* 8.5*  BILIDIR 7.5* 7.4*  IBILI 1.7* 1.1*   PT/INR  Recent Labs  08/23/14 1415 08/24/14 0620  LABPROT 21.5* 22.1*  INR 1.87* 1.93*   PANCREAS  Recent Labs  08/23/14 1237  LIPASE 810*         Studies/Results: Ct Abdomen Pelvis Wo Contrast  08/23/2014   CLINICAL DATA:  Anorexia. Generalized weakness. Symptoms worse over the last 8 weeks. 15 lb weight loss. Frequent falls. Jaundice. No abdominal pain, nausea, vomiting.  EXAM: CT ABDOMEN AND PELVIS WITHOUT CONTRAST  TECHNIQUE: Multidetector CT imaging of the abdomen and pelvis was performed following the standard protocol without IV contrast.  COMPARISON:  CT of the abdomen and pelvis on 02/28/2012  FINDINGS: Lower chest: There is mild fibrosis at the lung bases which may be chronic. Atherosclerotic calcification is noted in the coronary vessels.  There is a diaphragmatic node measuring 1.0 cm.  Upper abdomen: There is marked intrahepatic and extrahepatic biliary ductal dilatation. The common bile duct measures as large as 2.6 cm. A distal common bile duct hyperdense mass is faintly seen and measures 1.4 cm. This may represent a gallstone or soft tissue mass. The patient has had previous cholecystectomy. Given the lack of intravenous contrast, it would be difficult to exclude a liver lesion but none are seen. A probable hyperdense cyst is identified involving the midpole region right kidney and measures 1.6 cm. The left kidney has a normal appearance. The spleen has a normal appearance. Adrenal glands are unremarkable in appearance. No focal abnormality identified within the pancreas.  Bowel: The stomach and small bowel loops are normal in appearance. There is moderate sigmoid diverticulosis. Significant stool is identified throughout the colon. The appendix is well seen and has a normal appearance.  Pelvis: The uterus is surgically absent. The urinary bladder is distended. Suspect right adnexal mass/cyst measuring 1.7 cm. Left adnexal region has a normal appearance. There is no free pelvic fluid.  Retroperitoneum: There is atherosclerotic calcification of the aorta. No retroperitoneal or mesenteric adenopathy.  Abdominal wall: Unremarkable.  Osseous structures: Significant hardware from prior spinal fusion. No suspicious lytic or blastic lesions.  IMPRESSION: 1. Marked biliary dilatation to the level of the distal common bile duct. Faint hyperdense mass measuring 1.4 cm seen in the distal common bile duct. Considerations include a  stone or tumor. Further evaluation is warranted. Consider MRI/MRCP if the patient is able to tolerate MRI. Otherwise, consider ultrasound of the abdomen. 2. Probable right renal cyst. 3. Sigmoid diverticulosis. 4. Suspect right adnexal mass/cyst measuring 1.7 cm. This has benign characteristics. No further evaluation is felt to be  necessary. This recommendation follows ACR consensus guidelines: White Paper of the ACR Incidental Findings Committee II on Adnexal Findings. J Am Coll Radiol [phone].   Electronically Signed   By: Shon Hale M.D.   On: 08/23/2014 16:54   Ct Head Wo Contrast  08/23/2014   CLINICAL DATA:  78 year old female with fall, weakness, head and neck injury.  EXAM: CT HEAD WITHOUT CONTRAST  CT CERVICAL SPINE WITHOUT CONTRAST  TECHNIQUE: Multidetector CT imaging of the head and cervical spine was performed following the standard protocol without intravenous contrast. Multiplanar CT image reconstructions of the cervical spine were also generated.  COMPARISON:  03/28/2012 head CT  FINDINGS: CT HEAD FINDINGS  Minimal chronic small-vessel white matter ischemic changes again noted.  No acute intracranial abnormalities are identified, including mass lesion or mass effect, hydrocephalus, extra-axial fluid collection, midline shift, hemorrhage, or acute infarction. The visualized bony calvarium is unremarkable.  CT CERVICAL SPINE FINDINGS  There is no evidence of acute fracture or subluxation or prevertebral soft tissue swelling.  Multilevel degenerative disc disease, spondylosis and facet arthropathy identified with moderate degenerative disc disease from C5-C7.  The soft tissue structures are unremarkable.  IMPRESSION: No evidence of acute intracranial abnormality or static evidence of acute injury to the cervical spine.  Degenerative changes within the cervical spine.   Electronically Signed   By: Hassan Rowan M.D.   On: 08/23/2014 16:47   Ct Cervical Spine Wo Contrast  08/23/2014   CLINICAL DATA:  78 year old female with fall, weakness, head and neck injury.  EXAM: CT HEAD WITHOUT CONTRAST  CT CERVICAL SPINE WITHOUT CONTRAST  TECHNIQUE: Multidetector CT imaging of the head and cervical spine was performed following the standard protocol without intravenous contrast. Multiplanar CT image reconstructions of the cervical  spine were also generated.  COMPARISON:  03/28/2012 head CT  FINDINGS: CT HEAD FINDINGS  Minimal chronic small-vessel white matter ischemic changes again noted.  No acute intracranial abnormalities are identified, including mass lesion or mass effect, hydrocephalus, extra-axial fluid collection, midline shift, hemorrhage, or acute infarction. The visualized bony calvarium is unremarkable.  CT CERVICAL SPINE FINDINGS  There is no evidence of acute fracture or subluxation or prevertebral soft tissue swelling.  Multilevel degenerative disc disease, spondylosis and facet arthropathy identified with moderate degenerative disc disease from C5-C7.  The soft tissue structures are unremarkable.  IMPRESSION: No evidence of acute intracranial abnormality or static evidence of acute injury to the cervical spine.  Degenerative changes within the cervical spine.   Electronically Signed   By: Hassan Rowan M.D.   On: 08/23/2014 16:47   US Abdomen Complete  08/23/2014   CLINICAL DATA:  78 year old female with jaundice. History of cholecystectomy.  EXAM: ULTRASOUND ABDOMEN COMPLETE  COMPARISON:  02/28/2012 CT  FINDINGS: Gallbladder:  Gallbladder is not visualized compatible with cholecystectomy.  Common bile duct:  Diameter: 18.6 mm. Intrahepatic biliary dilatation is identified. An obstructing cause is not identified on this study but the mid and distal CBD are not well visualized.  Liver:  No focal lesion identified. Within normal limits in parenchymal echogenicity.  IVC:  No abnormality visualized.  Pancreas:  The head and tail of the pancreas are difficult to visualize. Pancreatic ductal  dilatation is noted.  Spleen:  Size and appearance within normal limits.  Right Kidney:  Length: 12.1 cm. Echogenicity is upper limits of normal. No mass or hydronephrosis visualized. Multiple cysts are identified.  Left Kidney:  Length: 11.7 cm. Echogenicity is upper limits of normal. No mass or hydronephrosis visualized.  Abdominal aorta:  No  aneurysm visualized.  Other findings:  None.  IMPRESSION: CBD and intrahepatic biliary dilatation and pancreatic ductal dilatation without identifiable obstructing cause on this study. The mid and distal CBD as well as the pancreatic head are not well visualized. Recommend further evaluation.  Upper limits of normal renal echogenicity which may be seen with medical renal disease.  Cholecystectomy.   Electronically Signed   By: Hassan Rowan M.D.   On: 08/23/2014 16:26   Dg Chest Port 1 View  08/23/2014   CLINICAL DATA:  SOB.  Weakness, jaundice, hypertension.  EXAM: PORTABLE CHEST - 1 VIEW  COMPARISON:  04/28/2007  FINDINGS: Shallow lung inflation. The heart is enlarged. There is perihilar peribronchial thickening. No focal consolidations or pleural effusions. No pulmonary edema. The patient has had previous left clavicle ORIF an left shoulder arthroplasty. Patient has had posterior thoracolumbar fusion. Surgical clips are noted in the right upper quadrant of the abdomen.  IMPRESSION: 1. Shallow inflation. 2. Bronchitic changes. 3.  No focal acute pulmonary abnormality.   Electronically Signed   By: Shon Hale M.D.   On: 08/23/2014 13:32    Medications: I have reviewed the patient's current medications.  Assessment/Plan: 1. Biliary Obstruction. Panc mass vs stone. MRCP pending Wt loss,anorexia, lack of pain bothersome for tumor. 2. C Diff. On flagyl now. Actually having very little diarrhea.  Am trying to get her scheduled for ERCP tomorrow somewhere. [facility] apparently full, looking into [facility]. Will need anesthesia. Continue ABs, check MRCP, check labs.   Berenice Oehlert JR,Tambi Thole L 08/25/2014, 8:31 AM

## 2014-08-25 NOTE — Progress Notes (Addendum)
PATIENT DETAILS Name: [name] [name] Age: 78 y.o. Sex: female Date of Birth: January 21, 1936 Admit Date: 08/23/2014 Admitting Physician Evalee Mutton Kristeen Mans, MD OZY:[id number],[id number] [name], MD  Subjective: Essentially unchanged.Diarrhea better-one BM overnight  Assessment/Plan: Principal Problem: Obstructive jaundice  - Painless jaundice - CT scan of the abdomen without contrast - shows a distal CBD lesion-patient is status post cholecystectomy (done in the 1990s) -not sure whether this is malignant lesion or secondary to choledocholithiasis -GI consulted-recommendations are for MRCP to further delineate pathology-GI tentatively planning ERCP 9/14  Active Problems: Suspected Acute cholangitis  - Given history of chills, leukocytosis-suspect some amount of cholangitis started empirically on Zosyn-day 3. Remains afebrile since admission, leukocytosis down trending.Unfortunately developed slight worsening of chronic diarrhea (has IBS) -C Diff PCR positive-started Flagyl-day 2, but given risk of Cholangitis-will continue with Zosyn. Blood Cultures on 9/11-pending  C Diff Colitis -developed slight worsening of diarrhea post admission-now better and back to usual baseline. Suspect patient may have received prophylactic antibiotics during her 2 most recent admissions. C Diff PCR positive on 9/12. Has chronic diarrhea from what diarrhea predominant IBS. -start Flagyl-day 2 -will extend treatment by 1 week following completion of antibiotics for presumed cholangitis  ARF (acute renal failure)  - Likely prerenal, given poor oral intake, use of diuretics and acute illness. -resolved with IVF  Hypokalemia -secondary to diarrhea/diuretics -replete and recheck in am  Coagulopathy -secondary to obstructive jaundice -INR much better following Vit K   Hypertension -moderately well controlled with Atenolol-will increase Atenolol back to usual dose, currently at half home dose. Diuretics remain on  hold  IBS (irritable bowel syndrome)  - per history-diarrhea predominant irritable bowel syndrome - given acute illness, elevated LFTs-will hold all home medications for now. Resume when able   Frequent falls  - Likely secondary to generalized weakness from acute illness/dehydration. Hydrate, treat underlying acute illness, PT evaluation. CT of the head and CT of the cervical spine negative for acute abnormalities.  GERD (gastroesophageal reflux disease)  - Stable, continue PPI.   Anxiety  - On chronic Xanax at home-will continue   Severe malnutrition in the context of acute illness or injury  -supplements per nutrition  Disposition: Remain inpatient  DVT Prophylaxis:  SCD's  Code Status: Full code   Family Communication Daughter at bedside  Procedures:  NONE  CONSULTS:  GI  Time spent 40 minutes-which includes 50% of the time with face-to-face with patient/ family and coordinating care related to the above assessment and plan.  MEDICATIONS: Scheduled Meds: . ALPRAZolam  1 mg Oral QID  . atenolol  25 mg Oral Q breakfast  . levothyroxine  100 mcg Oral QAC breakfast  . metroNIDAZOLE  500 mg Oral 3 times per day  . pantoprazole (PROTONIX) IV  40 mg Intravenous Q24H  . piperacillin-tazobactam (ZOSYN)  IV  3.375 g Intravenous 3 times per day   Continuous Infusions: . sodium chloride 0.9 % 1,000 mL with potassium chloride 10 mEq infusion 75 mL/hr at 08/24/14 1538   PRN Meds:.acetaminophen, acetaminophen, albuterol, guaiFENesin-dextromethorphan, labetalol, LORazepam, morphine injection, ondansetron (ZOFRAN) IV, ondansetron, oxyCODONE, zolpidem  Antibiotics: Anti-infectives   Start     Dose/Rate Route Frequency Ordered Stop   08/24/14 1400  metroNIDAZOLE (FLAGYL) tablet 500 mg     500 mg Oral 3 times per day 08/24/14 1350 09/07/14 1359   08/23/14 1845  piperacillin-tazobactam (ZOSYN) IVPB 3.375 g     3.375 g 12.5 mL/hr over 240 Minutes Intravenous 3  times per day  08/23/14 1836         PHYSICAL EXAM: Vital signs in last 24 hours: Filed Vitals:   08/24/14 0702 08/24/14 1326 08/24/14 2103 08/25/14 0553  BP: 146/58 138/56 147/47 166/69  Pulse: 94 63 61 68  Temp: 99.9 F (37.7 C) 97.9 F (36.6 C) 98.4 F (36.9 C) 98.4 F (36.9 C)  TempSrc: Oral Oral Oral Oral  Resp: 25 20 18 17   Height:      Weight:      SpO2: 97% 98% 97% 97%    Weight change:  Filed Weights   08/23/14 1156 08/23/14 1929  Weight: 59.421 kg (131 lb) 63.05 kg (139 lb)   Body mass index is 26.28 kg/(m^2).   Gen Exam: Awake and alert with clear speech.  +Grossly icteric Neck: Supple, No JVD.   Chest: B/L Clear. No rales or rhonchi CVS: S1 S2 Regular, no murmurs.  Abdomen: soft, BS +, non tender, non distended.  Extremities: no edema, lower extremities warm to touch. Neurologic: Non Focal.   Skin: No Rash.   Wounds: N/A.   Intake/Output from previous day:  Intake/Output Summary (Last 24 hours) at 08/25/14 1156 Last data filed at 08/24/14 2223  Gross per 24 hour  Intake 436.25 ml  Output      0 ml  Net 436.25 ml     LAB RESULTS: CBC  Recent Labs Lab 08/23/14 1237 08/24/14 0620 08/25/14 0850  WBC 14.2* 13.6* 9.2  HGB 10.9* 9.7* 9.4*  HCT 32.4* 29.1* 28.3*  PLT 197 199 214  MCV 88.5 88.2 88.2  MCH 29.8 29.4 29.3  MCHC 33.6 33.3 33.2  RDW 14.3 14.4 14.4  LYMPHSABS 1.3  --   --   MONOABS 1.4*  --   --   EOSABS 0.3  --   --   BASOSABS 0.0  --   --     Chemistries   Recent Labs Lab 08/23/14 1237 08/24/14 0620 08/25/14 0850  NA 138 141 143  K 3.1* 2.9* 3.3*  CL 100 108 111  CO2 20 16* 19  GLUCOSE 87 89 94  BUN 57* 28* 19  CREATININE 1.81* 0.84 0.67  CALCIUM 9.0 8.0* 8.5    CBG: No results found for this basename: GLUCAP,  in the last 168 hours  GFR Estimated Creatinine Clearance: 51.7 ml/min (by C-G formula based on Cr of 0.67).  Coagulation profile  Recent Labs Lab 08/23/14 1415 08/24/14 0620 08/25/14 0850  INR 1.87* 1.93*  1.11    Cardiac Enzymes No results found for this basename: CK, CKMB, TROPONINI, MYOGLOBIN,  in the last 168 hours  No components found with this basename: POCBNP,  No results found for this basename: DDIMER,  in the last 72 hours No results found for this basename: HGBA1C,  in the last 72 hours No results found for this basename: CHOL, HDL, LDLCALC, TRIG, CHOLHDL, LDLDIRECT,  in the last 72 hours No results found for this basename: TSH, T4TOTAL, FREET3, T3FREE, THYROIDAB,  in the last 72 hours No results found for this basename: VITAMINB12, FOLATE, FERRITIN, TIBC, IRON, RETICCTPCT,  in the last 72 hours  Recent Labs  08/23/14 1237 08/25/14 0850  LIPASE 810* 51    Urine Studies No results found for this basename: UACOL, UAPR, USPG, UPH, UTP, UGL, UKET, UBIL, UHGB, UNIT, UROB, ULEU, UEPI, UWBC, URBC, UBAC, CAST, CRYS, UCOM, BILUA,  in the last 72 hours  MICROBIOLOGY: Recent Results (from the past 240 hour(s))  CLOSTRIDIUM DIFFICILE  BY PCR     Status: Abnormal   Collection Time    08/24/14  2:51 AM      Result Value Ref Range Status   C difficile by pcr POSITIVE (*) NEGATIVE Final   Comment: CRITICAL RESULT CALLED TO, READ BACK BY AND VERIFIED WITH:     ZELLNER R.,RN 08/24/14 1231 BY JONESJ    RADIOLOGY STUDIES/RESULTS: Ct Abdomen Pelvis Wo Contrast  08/23/2014   CLINICAL DATA:  Anorexia. Generalized weakness. Symptoms worse over the last 8 weeks. 15 lb weight loss. Frequent falls. Jaundice. No abdominal pain, nausea, vomiting.  EXAM: CT ABDOMEN AND PELVIS WITHOUT CONTRAST  TECHNIQUE: Multidetector CT imaging of the abdomen and pelvis was performed following the standard protocol without IV contrast.  COMPARISON:  CT of the abdomen and pelvis on 02/28/2012  FINDINGS: Lower chest: There is mild fibrosis at the lung bases which may be chronic. Atherosclerotic calcification is noted in the coronary vessels. There is a diaphragmatic node measuring 1.0 cm.  Upper abdomen: There is marked  intrahepatic and extrahepatic biliary ductal dilatation. The common bile duct measures as large as 2.6 cm. A distal common bile duct hyperdense mass is faintly seen and measures 1.4 cm. This may represent a gallstone or soft tissue mass. The patient has had previous cholecystectomy. Given the lack of intravenous contrast, it would be difficult to exclude a liver lesion but none are seen. A probable hyperdense cyst is identified involving the midpole region right kidney and measures 1.6 cm. The left kidney has a normal appearance. The spleen has a normal appearance. Adrenal glands are unremarkable in appearance. No focal abnormality identified within the pancreas.  Bowel: The stomach and small bowel loops are normal in appearance. There is moderate sigmoid diverticulosis. Significant stool is identified throughout the colon. The appendix is well seen and has a normal appearance.  Pelvis: The uterus is surgically absent. The urinary bladder is distended. Suspect right adnexal mass/cyst measuring 1.7 cm. Left adnexal region has a normal appearance. There is no free pelvic fluid.  Retroperitoneum: There is atherosclerotic calcification of the aorta. No retroperitoneal or mesenteric adenopathy.  Abdominal wall: Unremarkable.  Osseous structures: Significant hardware from prior spinal fusion. No suspicious lytic or blastic lesions.  IMPRESSION: 1. Marked biliary dilatation to the level of the distal common bile duct. Faint hyperdense mass measuring 1.4 cm seen in the distal common bile duct. Considerations include a stone or tumor. Further evaluation is warranted. Consider MRI/MRCP if the patient is able to tolerate MRI. Otherwise, consider ultrasound of the abdomen. 2. Probable right renal cyst. 3. Sigmoid diverticulosis. 4. Suspect right adnexal mass/cyst measuring 1.7 cm. This has benign characteristics. No further evaluation is felt to be necessary. This recommendation follows ACR consensus guidelines: White Paper of  the ACR Incidental Findings Committee II on Adnexal Findings. J Am Coll Radiol [phone].   Electronically Signed   By: Shon Hale M.D.   On: 08/23/2014 16:54   Ct Head Wo Contrast  08/23/2014   CLINICAL DATA:  78 year old female with fall, weakness, head and neck injury.  EXAM: CT HEAD WITHOUT CONTRAST  CT CERVICAL SPINE WITHOUT CONTRAST  TECHNIQUE: Multidetector CT imaging of the head and cervical spine was performed following the standard protocol without intravenous contrast. Multiplanar CT image reconstructions of the cervical spine were also generated.  COMPARISON:  03/28/2012 head CT  FINDINGS: CT HEAD FINDINGS  Minimal chronic small-vessel white matter ischemic changes again noted.  No acute intracranial abnormalities are identified, including mass  lesion or mass effect, hydrocephalus, extra-axial fluid collection, midline shift, hemorrhage, or acute infarction. The visualized bony calvarium is unremarkable.  CT CERVICAL SPINE FINDINGS  There is no evidence of acute fracture or subluxation or prevertebral soft tissue swelling.  Multilevel degenerative disc disease, spondylosis and facet arthropathy identified with moderate degenerative disc disease from C5-C7.  The soft tissue structures are unremarkable.  IMPRESSION: No evidence of acute intracranial abnormality or static evidence of acute injury to the cervical spine.  Degenerative changes within the cervical spine.   Electronically Signed   By: Hassan Rowan M.D.   On: 08/23/2014 16:47   Ct Cervical Spine Wo Contrast  08/23/2014   CLINICAL DATA:  78 year old female with fall, weakness, head and neck injury.  EXAM: CT HEAD WITHOUT CONTRAST  CT CERVICAL SPINE WITHOUT CONTRAST  TECHNIQUE: Multidetector CT imaging of the head and cervical spine was performed following the standard protocol without intravenous contrast. Multiplanar CT image reconstructions of the cervical spine were also generated.  COMPARISON:  03/28/2012 head CT  FINDINGS: CT HEAD  FINDINGS  Minimal chronic small-vessel white matter ischemic changes again noted.  No acute intracranial abnormalities are identified, including mass lesion or mass effect, hydrocephalus, extra-axial fluid collection, midline shift, hemorrhage, or acute infarction. The visualized bony calvarium is unremarkable.  CT CERVICAL SPINE FINDINGS  There is no evidence of acute fracture or subluxation or prevertebral soft tissue swelling.  Multilevel degenerative disc disease, spondylosis and facet arthropathy identified with moderate degenerative disc disease from C5-C7.  The soft tissue structures are unremarkable.  IMPRESSION: No evidence of acute intracranial abnormality or static evidence of acute injury to the cervical spine.  Degenerative changes within the cervical spine.   Electronically Signed   By: Hassan Rowan M.D.   On: 08/23/2014 16:47   US Abdomen Complete  08/23/2014   CLINICAL DATA:  78 year old female with jaundice. History of cholecystectomy.  EXAM: ULTRASOUND ABDOMEN COMPLETE  COMPARISON:  02/28/2012 CT  FINDINGS: Gallbladder:  Gallbladder is not visualized compatible with cholecystectomy.  Common bile duct:  Diameter: 18.6 mm. Intrahepatic biliary dilatation is identified. An obstructing cause is not identified on this study but the mid and distal CBD are not well visualized.  Liver:  No focal lesion identified. Within normal limits in parenchymal echogenicity.  IVC:  No abnormality visualized.  Pancreas:  The head and tail of the pancreas are difficult to visualize. Pancreatic ductal dilatation is noted.  Spleen:  Size and appearance within normal limits.  Right Kidney:  Length: 12.1 cm. Echogenicity is upper limits of normal. No mass or hydronephrosis visualized. Multiple cysts are identified.  Left Kidney:  Length: 11.7 cm. Echogenicity is upper limits of normal. No mass or hydronephrosis visualized.  Abdominal aorta:  No aneurysm visualized.  Other findings:  None.  IMPRESSION: CBD and intrahepatic  biliary dilatation and pancreatic ductal dilatation without identifiable obstructing cause on this study. The mid and distal CBD as well as the pancreatic head are not well visualized. Recommend further evaluation.  Upper limits of normal renal echogenicity which may be seen with medical renal disease.  Cholecystectomy.   Electronically Signed   By: Hassan Rowan M.D.   On: 08/23/2014 16:26   Dg Chest Port 1 View  08/23/2014   CLINICAL DATA:  SOB.  Weakness, jaundice, hypertension.  EXAM: PORTABLE CHEST - 1 VIEW  COMPARISON:  04/28/2007  FINDINGS: Shallow lung inflation. The heart is enlarged. There is perihilar peribronchial thickening. No focal consolidations or pleural effusions. No pulmonary  edema. The patient has had previous left clavicle ORIF an left shoulder arthroplasty. Patient has had posterior thoracolumbar fusion. Surgical clips are noted in the right upper quadrant of the abdomen.  IMPRESSION: 1. Shallow inflation. 2. Bronchitic changes. 3.  No focal acute pulmonary abnormality.   Electronically Signed   By: Shon Hale M.D.   On: 08/23/2014 13:32    Oren Binet, MD  [facility] Pager:[phone]  If 7PM-7AM, please contact night-coverage [email or web address] Password [email or web address] 08/25/2014, 11:56 AM   LOS: 2 days   **Disclaimer: This note may have been dictated with voice recognition software. Similar sounding words can inadvertently be transcribed and this note may contain transcription errors which may not have been corrected upon publication of note.**

## 2014-08-26 ENCOUNTER — Encounter (HOSPITAL_COMMUNITY)
Admission: EM | Disposition: A | Discharge status: Home Health | Payer: Self-pay | Source: Home / Self Care | Attending: Internal Medicine

## 2014-08-26 ENCOUNTER — Inpatient Hospital Stay (HOSPITAL_COMMUNITY): Payer: Medicare Other | Admitting: Anesthesiology

## 2014-08-26 ENCOUNTER — Inpatient Hospital Stay (HOSPITAL_COMMUNITY): Payer: Medicare Other

## 2014-08-26 ENCOUNTER — Encounter (HOSPITAL_COMMUNITY): Payer: Medicare Other | Admitting: Anesthesiology

## 2014-08-26 ENCOUNTER — Encounter (HOSPITAL_COMMUNITY): Payer: Self-pay | Admitting: Anesthesiology

## 2014-08-26 HISTORY — PX: BILE DUCT STENT PLACEMENT: SHX1227

## 2014-08-26 HISTORY — PX: ERCP: SHX5425

## 2014-08-26 LAB — COMPREHENSIVE METABOLIC PANEL
ALT: 35 U/L (ref 0–35)
AST: 28 U/L (ref 0–37)
Albumin: 1.7 g/dL — ABNORMAL LOW (ref 3.5–5.2)
Alkaline Phosphatase: 364 U/L — ABNORMAL HIGH (ref 39–117)
Anion gap: 13 (ref 5–15)
BUN: 12 mg/dL (ref 6–23)
CO2: 20 meq/L (ref 19–32)
CREATININE: 0.61 mg/dL (ref 0.50–1.10)
Calcium: 8.6 mg/dL (ref 8.4–10.5)
Chloride: 108 mEq/L (ref 96–112)
GFR calc Af Amer: >90 mL/min (ref >90)
GFR, EST NON AFRICAN AMERICAN: 87 mL/min — AB (ref >90)
Glucose, Bld: 113 mg/dL — ABNORMAL HIGH (ref 70–99)
Potassium: 3.7 mEq/L (ref 3.7–5.3)
SODIUM: 141 meq/L (ref 137–147)
Total Bilirubin: 6.7 mg/dL — ABNORMAL HIGH (ref 0.3–1.2)
Total Protein: 5.6 g/dL — ABNORMAL LOW (ref 6.0–8.3)

## 2014-08-26 LAB — CBC
HCT: 29.6 % — ABNORMAL LOW (ref 36.0–46.0)
Hemoglobin: 9.8 g/dL — ABNORMAL LOW (ref 12.0–15.0)
MCH: 29.6 pg (ref 26.0–34.0)
MCHC: 33.1 g/dL (ref 30.0–36.0)
MCV: 89.4 fL (ref 78.0–100.0)
PLATELETS: 260 10*3/uL (ref 150–400)
RBC: 3.31 MIL/uL — AB (ref 3.87–5.11)
RDW: 14.5 % (ref 11.5–15.5)
WBC: 7.6 10*3/uL (ref 4.0–10.5)

## 2014-08-26 SURGERY — ERCP, WITH INTERVENTION IF INDICATED
Anesthesia: General

## 2014-08-26 MED ORDER — LACTATED RINGERS IV SOLN
INTRAVENOUS | Status: DC | PRN
  Administered 2014-08-26: 10:00:00 via INTRAVENOUS

## 2014-08-26 MED ORDER — PROMETHAZINE HCL 25 MG/ML IJ SOLN: 6.2500 mg | INTRAMUSCULAR | Status: DC | PRN

## 2014-08-26 MED ORDER — LACTATED RINGERS IV SOLN
INTRAVENOUS | Status: DC
  Administered 2014-08-26: 1000 mL via INTRAVENOUS

## 2014-08-26 MED ORDER — ONDANSETRON HCL 4 MG/2ML IJ SOLN
INTRAMUSCULAR | Status: DC | PRN
  Administered 2014-08-26: 4 mg via INTRAVENOUS

## 2014-08-26 MED ORDER — PROPOFOL 10 MG/ML IV BOLUS
INTRAVENOUS | Status: DC | PRN
  Administered 2014-08-26: 130 mg via INTRAVENOUS

## 2014-08-26 MED ORDER — FENTANYL CITRATE 0.05 MG/ML IJ SOLN
INTRAMUSCULAR | Status: DC | PRN
  Administered 2014-08-26 (×3): 50 ug via INTRAVENOUS

## 2014-08-26 MED ORDER — PANTOPRAZOLE SODIUM 40 MG PO TBEC
40.0000 mg | DELAYED_RELEASE_TABLET | Freq: Every day | ORAL | Status: DC
  Administered 2014-08-26 – 2014-08-27 (×2): 40 mg via ORAL
  Filled 2014-08-26 (×2): qty 1

## 2014-08-26 MED ORDER — GLUCAGON HCL RDNA (DIAGNOSTIC) 1 MG IJ SOLR
INTRAMUSCULAR | Status: AC
  Filled 2014-08-26: qty 2

## 2014-08-26 MED ORDER — GLYCOPYRROLATE 0.2 MG/ML IJ SOLN
INTRAMUSCULAR | Status: DC | PRN
  Administered 2014-08-26: 0.4 mg via INTRAVENOUS

## 2014-08-26 MED ORDER — NEOSTIGMINE METHYLSULFATE 10 MG/10ML IV SOLN
INTRAVENOUS | Status: DC | PRN
  Administered 2014-08-26: 3 mg via INTRAVENOUS

## 2014-08-26 MED ORDER — EPHEDRINE SULFATE 50 MG/ML IJ SOLN
INTRAMUSCULAR | Status: DC | PRN
  Administered 2014-08-26: 10 mg via INTRAVENOUS

## 2014-08-26 MED ORDER — LIDOCAINE HCL (CARDIAC) 20 MG/ML IV SOLN
INTRAVENOUS | Status: DC | PRN
  Administered 2014-08-26: 70 mg via INTRAVENOUS

## 2014-08-26 MED ORDER — SODIUM CHLORIDE 0.9 % IV SOLN
INTRAVENOUS | Status: DC | PRN
  Administered 2014-08-26: 11:00:00

## 2014-08-26 MED ORDER — ROCURONIUM BROMIDE 100 MG/10ML IV SOLN
INTRAVENOUS | Status: DC | PRN
  Administered 2014-08-26: 30 mg via INTRAVENOUS

## 2014-08-26 MED ORDER — SODIUM CHLORIDE 0.9 % IV SOLN: INTRAVENOUS | Status: DC

## 2014-08-26 MED ORDER — HYDROMORPHONE HCL PF 1 MG/ML IJ SOLN: 0.2500 mg | INTRAMUSCULAR | Status: DC | PRN

## 2014-08-26 SURGICAL SUPPLY — 1 items: double pigtail biliary stent 10f-5cm ×2 IMPLANT

## 2014-08-26 NOTE — Care Management Note (Addendum)
    Page 1 of 2   08/28/2014     3:38:59 PM CARE MANAGEMENT NOTE 08/28/2014  Patient:  [name] [name], [name] [name]   Account Number:  [id number]  Date Initiated:  08/26/2014  Documentation initiated by:  Tomi Bamberger  Subjective/Objective Assessment:   dx obs jaudice, c diff (susp) cbd obstruction ? malignancy  admit- lives with spouse.     Action/Plan:   pt eval- rec hhpt with 24 hr  9/14- ERCP , Stent   Anticipated DC Date:  08/28/2014   Anticipated DC Plan:  [place]  CM consult      [facility] Choice  HOME HEALTH   Choice offered to / List presented to:  C-1 Patient        [place] arranged  HH-1 RN  [place]      [place].   Status of service:  Completed, signed off Medicare Important Message given?  YES (If response is "NO", the following Medicare IM given date fields will be blank) Date Medicare IM given:  08/26/2014 Medicare IM given by:  Tomi Bamberger Date Additional Medicare IM given:   Additional Medicare IM given by:    Discharge Disposition:  [place]  Per UR Regulation:  Reviewed for med. necessity/level of care/duration of stay  If discussed at [place] of Stay Meetings, dates discussed:    Comments:  08/28/14 Maish Vaya, BSN [phone] patient is for dc today, she and her husband chose [facility] for [facility], PT, aide and social work.  Referral made to [facility], [facility] notified, soc will begin 24-48 hrs post dc.  08/27/14 1606 Tomi Bamberger RN, BSN [phone] patient had ERCP /stent yesterday.

## 2014-08-26 NOTE — Progress Notes (Signed)
[name] [name] 9:50 AM  Subjective: Patient without any GI complaints and her case was discussed with my partner and the patient and her family and the hospital computer was reviewed including labs MRCP and CT  Objective: Vital signs stable afebrile no acute distress exam please see pre-assessment evaluation labs MRCP and CT reviewed as above  Assessment: Obstructive jaundice  Plan: The risks benefits methods and success rate of ERCP was rediscussed with the patient and her family and will proceed with anesthesia assistance with further workup and plans and recommendations pending those findings  [place] E

## 2014-08-26 NOTE — Anesthesia Preprocedure Evaluation (Addendum)
Anesthesia Evaluation  Patient identified by MRN, date of birth, ID band Patient awake    Reviewed: Allergy & Precautions, H&P , NPO status , Patient's Chart, lab work & pertinent test results  History of Anesthesia Complications Negative for: history of anesthetic complications  Airway Mallampati: II TM Distance: >3 FB Neck ROM: Full    Dental  (+) Teeth Intact, Caps, Dental Advisory Given   Pulmonary neg pulmonary ROS,    Pulmonary exam normal       Cardiovascular hypertension, Pt. on medications     Neuro/Psych Anxiety    GI/Hepatic Neg liver ROS, hiatal hernia, GERD-  Medicated,  Endo/Other  Hypothyroidism   Renal/GU Renal diseasenegative Renal ROS     Musculoskeletal  (+) Arthritis -,   Abdominal   Peds  Hematology   Anesthesia Other Findings   Reproductive/Obstetrics                          Anesthesia Physical Anesthesia Plan  ASA: III  Anesthesia Plan: General   Post-op Pain Management:    Induction: Intravenous  Airway Management Planned: Oral ETT  Additional Equipment:   Intra-op Plan:   Post-operative Plan: Extubation in OR  Informed Consent: I have reviewed the patients History and Physical, chart, labs and discussed the procedure including the risks, benefits and alternatives for the proposed anesthesia with the patient or authorized representative who has indicated his/her understanding and acceptance.   Dental advisory given and Consent reviewed with POA  Plan Discussed with: CRNA, Anesthesiologist and Surgeon  Anesthesia Plan Comments:        Anesthesia Quick Evaluation

## 2014-08-26 NOTE — Progress Notes (Signed)
Physical Therapy Treatment Patient Details Name: [name] [name] MRN: [id number] DOB: 1936/11/19 Today's Date: 08/26/2014    History of Present Illness Patient is a 78 y/o female admitted with weakness, yellowish discoloration of skin, nausea and vomiting for 6 weeks. Pt had a fall with a fractured clavicle about 6 weeks ago and underwent open reduction and internal fixation 8/6. In ED, pt markedly jaundiced with a bilirubin of almost 9, a CT scan of the abdomen without contrast showed a possible distal CBD lesion with both extrahepatic intrahepatic ductal dilatation and 1.4 cm mass located near the junction of the common bile duct and the pancreatic duct. S/p ECRP 9/14.    PT Comments    Patient progressing well with mobility. Tolerated ambulating community distances with use of RW for support. Mild unsteadiness noted during gait. Highly motivated and cooperative with therapy. Encourage daily ambulation with RN. Will need to assess stair negotiation next session. Will continue to follow and progress.   Follow Up Recommendations  Home health PT;Supervision/Assistance - 24 hour     Equipment Recommendations  None recommended by PT    Recommendations for Other Services       Precautions / Restrictions Precautions Precautions: Fall Restrictions Weight Bearing Restrictions: No    Mobility  Bed Mobility Overal bed mobility: Modified Independent Bed Mobility: Supine to Sit;Sit to Supine     Supine to sit: Modified independent (Device/Increase time) Sit to supine: Modified independent (Device/Increase time)   General bed mobility comments: HOB flat, no use of rails to simulate home environment.  Transfers Overall transfer level: Needs assistance Equipment used: Rolling walker (2 wheeled) Transfers: Sit to/from Stand Sit to Stand: Supervision         General transfer comment: SUpervision for safety upon standing. VC for hand placement.  Ambulation/Gait Ambulation/Gait  assistance: Min guard Ambulation Distance (Feet): 300 Feet Assistive device: Rolling walker (2 wheeled) Gait Pattern/deviations: Step-through pattern;Decreased stride length Gait velocity: 1.6 ft/sec   General Gait Details: Pt with mild unsteadiness but no overt LOB. Min guard for safety.    Stairs            Wheelchair Mobility    Modified Rankin (Stroke Patients Only)       Balance Overall balance assessment: Needs assistance   Sitting balance-Leahy Scale: Fair     Standing balance support: During functional activity Standing balance-Leahy Scale: Poor Standing balance comment: Requires UE support for dynamic standing activities due to generalized weakness.                     Cognition Arousal/Alertness: Awake/alert Behavior During Therapy: WFL for tasks assessed/performed Overall Cognitive Status: Within Functional Limits for tasks assessed                      Exercises General Exercises - Lower Extremity Ankle Circles/Pumps: Both;20 reps;Seated Long Arc Quad: Both;20 reps;Seated (x2 sets) Hip ABduction/ADduction: Both;20 reps;Seated (x2 sets) Hip Flexion/Marching: Both;20 reps;Seated Other Exercises Other Exercises: Sit<-> stand x10 without UE support with emphasize on slow descent for eccentric quad control.    General Comments General comments (skin integrity, edema, etc.): Coloring seems to be improving with less yellowish tint noted.      Pertinent Vitals/Pain Pain Assessment: No/denies pain    Home Living                      Prior Function  PT Goals (current goals can now be found in the care plan section) Progress towards PT goals: Progressing toward goals    Frequency  Min 3X/week    PT Plan Current plan remains appropriate    Co-evaluation             End of Session Equipment Utilized During Treatment: Gait belt Activity Tolerance: Patient tolerated treatment well Patient left: in bed;with  call bell/phone within reach;with bed alarm set     Time: 6720-9470 PT Time Calculation (min): 26 min  Charges:  $Gait Training: 8-22 mins $Therapeutic Exercise: 8-22 mins                    G CodesCandy Sledge A 2014-09-04, 4:53 PM Candy Sledge, [place], DPT [phone]

## 2014-08-26 NOTE — Op Note (Signed)
[facility] [place], [id number]   ERCP PROCEDURE REPORT  PATIENT: [name] [name], [name] [name].  MR# :[id number] BIRTHDATE: 05/19/1936  GENDER: Female ENDOSCOPIST: Clarene Essex, MD REFERRED BY: Nehemiah Settle, M.D. PROCEDURE DATE:  08/26/2014 PROCEDURE:   ERCP with sphincterotomy/papillotomy and ERCP with stent placement  with brushing and balloon pull-through ASA CLASS:    3 INDICATIONS: obstructive jaundice MEDICATIONS:    general anesthesia TOPICAL ANESTHETIC:  none  DESCRIPTION OF PROCEDURE:   After the risks benefits and alternatives of the procedure were thoroughly explained, informed consent was obtained.  The ercp pentax [id number]  endoscope was introduced through the mouth and advanced to the second portion of the duodenum .the scope was easily inserted into the duodenum and a slightly bulbous and erythematous ampulla was brought into view and using the triple-lumen sphincterotome loaded with the JAG Jagwire deep selective cannulation was obtained on the first attempt and a wire was advanced into the intrahepatics and CBD and intrahepatics were dilated and there was a short distal stricture and we went ahead and proceeded with a small to medium-sized sphincterotomy in the customary fashion until I could get the fully bowed sphincterotome easily in and out of the duct and then we proceeded with brushings in the customary fashion and then to better characterize the stricture we inserted the occlusion balloon and inflated to 15 mm and injected dye below the balloon and the stricture was confirmed distally and no additional findings were seen and as an aside there was no pancreatic duct injection or wire advancement throughout the procedure and the balloon even at 12 mm could not be pulled through the stricture and unfortunately we did not have a 4 cm removable stent so we elected to place a 10 French 5 cm double pigtail stent which we thought stay in  position better than the customary stent and the stent was deployed an adequate biliary drainage was seen and the stent was in the proper position both endoscopically and under fluoroscopy and the scope was removed and the patient tolerated the procedure well there was no obvious immediate complication        COMPLICATIONS:   none  ENDOSCOPIC IMPRESSION:#1 slightly bulbous and erythematous ampulla status post small to medium sphincterotomy 2 no pancreatic duct injection or wire advancement 3 short distal CBD stricture with dilation of the remaining system status post brushing and stenting as above  RECOMMENDATIONS:customary post-ERCP orders observe for delayed complications otherwise if no signs of other infection probably can stop Zosyn and just continue Flagyl for C. difficile and then slowly advance diet and hopefully go home soon and await cytology to decide further workup and plans and consider EUS and possible biliary endoscopy with spyglass and possible balloon dilatation or metal stenting in the future    _______________________________ eSigned:  Clarene Essex, MD 08/26/2014 11:18 AM   FO:YDXAJ Maxwell Caul, MD  PATIENT NAME:  [name] [name], [name] [name] MR#: [id number]

## 2014-08-26 NOTE — Progress Notes (Signed)
PROGRESS NOTE  [name] [name] HYW:[id number] DOB: 12/29/1935 DOA: 08/23/2014 PCP: Horton Finer, MD  HPI/Subjective: Essentially unchanged.  Pt states she had one bout of diarrhea yesterday, and no BM since.    Assessment/Plan: Principal Problem:  Obstructive jaundice  - Painless jaundice - CT scan of the abdomen without contrast - shows a distal CBD lesion-patient is status post cholecystectomy (done in the 1990s) -not sure whether this is malignant lesion or secondary to choledocholithiasis  -GI consulted-MRCP shows no stones, but CBD dilation and a 1.4 cm segment of stricture - ERCP with stent placement completed today. Await cytology/brushing results.  Active Problems:  Suspected Acute cholangitis  - Given history of chills, leukocytosis-suspect some amount of cholangitis started empirically on Zosyn-day 4. Remains afebrile since admission, leukocytosis down trending.Unfortunately developed slight worsening of chronic diarrhea (has IBS) -C Diff PCR positive-started Flagyl-day 3.  ERCP showed no signs of infection and will discontinue Zosyn on 9/14.  Blood Cultures on 9/11-pending - no growth to date.  C Diff Colitis  -developed slight worsening of diarrhea post admission-now better and back to usual baseline. Suspect patient may have received prophylactic antibiotics during her 2 most recent admissions. C Diff PCR positive on 9/12. Has chronic diarrhea from what diarrhea predominant IBS.  -start Flagyl-day 3 (14 more days from 9/14) -will plan for 14 days treatment from 9/14   ARF (acute renal failure)  - Likely prerenal, given poor oral intake, use of diuretics and acute illness.  - Resolved with IVF   Hypokalemia  - Secondary to diarrhea/diuretics  - Continue to replete and recheck in am   Coagulopathy  -Secondary to obstructive jaundice  -INR much better following Vit K   Hypertension  -Moderately well controlled with Atenolol. Diuretics remain on hold.  BP elevated  this morning. Suspect due to anxiety from ERCP procedure.   - Consider a dose of diuretics if it doesn't subside after procedure.  Continue to monitor.  IBS (irritable bowel syndrome)  - Per history-diarrhea predominant irritable bowel syndrome - given acute illness, elevated LFTs-will hold all home medications for now. Resume when able   Frequent falls  - Likely secondary to generalized weakness from acute illness/dehydration. Hydrate, treat underlying acute illness, PT evaluation. CT of the head and CT of the cervical spine negative for acute abnormalities.   GERD (gastroesophageal reflux disease)  - Stable, continue PPI.   Anxiety  - On chronic Xanax at home-will continue   Severe malnutrition in the context of acute illness or injury  -supplements per nutrition  DVT Prophylaxis:  SCDs  Code Status: Full Family Communication: Husband and daughter at bedside. Disposition Plan: Remain inpatient.    Consultants:  GI  Procedures:  MRCP, ERCP  Antibiotics: Anti-infectives   Start     Dose/Rate Route Frequency Ordered Stop   08/24/14 1400  [MAR Hold]  metroNIDAZOLE (FLAGYL) tablet 500 mg     (On MAR Hold since 08/26/14 0931)   500 mg Oral 3 times per day 08/24/14 1350 09/07/14 1359   08/23/14 1845  [MAR Hold]  piperacillin-tazobactam (ZOSYN) IVPB 3.375 g  Status:  Discontinued     (On MAR Hold since 08/26/14 0931)   3.375 g 12.5 mL/hr over 240 Minutes Intravenous 3 times per day 08/23/14 1836 08/26/14 1135      Objective: Filed Vitals:   08/25/14 1453 08/25/14 2122 08/26/14 0618 08/26/14 0845  BP: 158/65 146/65 145/73 139/78  Pulse: 68 72 66 74  Temp: 97.5 F (36.4 C)  98.6 F (37 C) 97.9 F (36.6 C)   TempSrc: Oral Axillary Oral   Resp: 16 46 66   Height:      Weight:      SpO2: 97% 100% 99%     Intake/Output Summary (Last 24 hours) at 08/26/14 0942 Last data filed at 08/25/14 1800  Gross per 24 hour  Intake 1911.25 ml  Output      0 ml  Net 1911.25 ml    Filed Weights   08/23/14 1156 08/23/14 1929  Weight: 59.421 kg (131 lb) 63.05 kg (139 lb)    Exam: General: Elderly female lying in bed, NAD, appears older than stated age.  Jaundiced.  HEENT:  Icteic Sclera.  Cardiovascular: RRR, S1 S2 auscultated, no rubs, murmurs or gallops.   Respiratory: Clear to auscultation bilaterally with equal chest rise.  No wheezes, rales, or crackles.  Abdomen: Mild tenderness to palpation in RUQ.  Soft, nondistended, + bowel sounds  Extremities: lower extremities warm dry without cyanosis clubbing or edema.  Skin: Bruises present on both arms. Psych: Normal affect and demeanor with poor judgement and insight.   Data Reviewed: Basic Metabolic Panel:  Recent Labs Lab 08/23/14 1237 08/24/14 0620 08/25/14 0850  NA 138 141 143  K 3.1* 2.9* 3.3*  CL 100 108 111  CO2 20 16* 19  GLUCOSE 87 89 94  BUN 57* 28* 19  CREATININE 1.81* 0.84 0.67  CALCIUM 9.0 8.0* 8.5   Liver Function Tests:  Recent Labs Lab 08/23/14 1237 08/24/14 0620 08/25/14 0850  AST 89* 64* 37  ALT 56* 47* 40*  ALKPHOS 342* 367* 345*  BILITOT 9.2* 8.5* 9.6*  PROT 6.7 5.5* 5.3*  ALBUMIN 2.2* 1.8* 1.7*    Recent Labs Lab 08/23/14 1237 08/25/14 0850  LIPASE 810* 51   No results found for this basename: AMMONIA,  in the last 168 hours CBC:  Recent Labs Lab 08/23/14 1237 08/24/14 0620 08/25/14 0850  WBC 14.2* 13.6* 9.2  NEUTROABS 11.1*  --   --   HGB 10.9* 9.7* 9.4*  HCT 32.4* 29.1* 28.3*  MCV 88.5 88.2 88.2  PLT 197 199 214    Recent Results (from the past 240 hour(s))  CULTURE, BLOOD (ROUTINE X 2)     Status: None   Collection Time    08/23/14  8:21 PM      Result Value Ref Range Status   Specimen Description BLOOD RIGHT HAND   Final   Special Requests BOTTLES DRAWN AEROBIC ONLY 2CC   Final   Culture  Setup Time     Final   Value: 08/24/2014 02:06     Performed at [facility]   Culture     Final   Value:        BLOOD CULTURE RECEIVED NO  GROWTH TO DATE CULTURE WILL BE HELD FOR 5 DAYS BEFORE ISSUING A FINAL NEGATIVE REPORT     Performed at [facility]   Report Status PENDING   Incomplete  CULTURE, BLOOD (ROUTINE X 2)     Status: None   Collection Time    08/23/14  8:26 PM      Result Value Ref Range Status   Specimen Description BLOOD LEFT HAND   Final   Special Requests BOTTLES DRAWN AEROBIC ONLY 2CC   Final   Culture  Setup Time     Final   Value: 08/24/2014 02:07     Performed at [facility]  Final   Value:        BLOOD CULTURE RECEIVED NO GROWTH TO DATE CULTURE WILL BE HELD FOR 5 DAYS BEFORE ISSUING A FINAL NEGATIVE REPORT     Performed at [facility]   Report Status PENDING   Incomplete  CLOSTRIDIUM DIFFICILE BY PCR     Status: Abnormal   Collection Time    08/24/14  2:51 AM      Result Value Ref Range Status   C difficile by pcr POSITIVE (*) NEGATIVE Final   Comment: CRITICAL RESULT CALLED TO, READ BACK BY AND VERIFIED WITH:     ZELLNER R.,RN 08/24/14 1231 BY JONESJ     Studies: Mr 3d Recon At Scanner  09-25-2014   CLINICAL DATA:  Evaluate for biliary tract obstruction.  EXAM: MRI ABDOMEN WITHOUT AND WITH CONTRAST (INCLUDING MRCP)  TECHNIQUE: Multiplanar multisequence MR imaging of the abdomen was performed both before and after the administration of intravenous contrast. Heavily T2-weighted images of the biliary and pancreatic ducts were obtained, and three-dimensional MRCP images were rendered by post processing.  CONTRAST:  64mL MULTIHANCE GADOBENATE DIMEGLUMINE 529 MG/ML IV SOLN  COMPARISON:  No priors. CT of the abdomen and pelvis without contrast 08/23/2014.  FINDINGS: Patient is status post cholecystectomy. MRCP images demonstrates moderate intra and extrahepatic biliary ductal dilatation. Common bile duct measures up to 10 mm distally, before it abruptly tapers to approximately 1 mm immediately adjacent to the level of the ampulla. This narrowing spans a length of  approximately 1.4 cm, and there is mild soft tissue prominence around this narrowed area of the duct, which demonstrates very subtle differential enhancement compared to the remainder of the pancreatic parenchyma. While there is no definite pancreatic head mass, the possibility of a tiny ampullary lesion is difficult to entirely exclude; however, these findings are favored to reflect a distal ductal stricture. Additionally, the pancreatic duct is not dilated. On image 31 of series 5 there is a tiny filling defect in the distal common bile duct, however, this is favored to be related to flow as there are no filling defects in this region on any of the other pulse sequences (including MRCP images) to suggest underlying ductal stone.  No suspicious hepatic lesion noted. There is slight heterogeneous signal intensity in a body and tail of the pancreas on T2 weighted images, with a small amount of fluid in the adjacent retroperitoneal regions, suggesting mild or resolving pancreatitis. This amorphous retroperitoneal fluid extends into the paracolic gutters bilaterally. No discrete peripancreatic fluid collections are noted at this time. The appearance of the spleen and bilateral adrenal glands is unremarkable. Multiple renal lesions are noted which are low T1 signal intensity, high T2 signal intensity, and do not enhance, compatible with simple cysts, largest of which measure up to 1.5 cm in diameter in the interpolar region of the right kidney.  Orthopedic fixation hardware in the visualized thoracolumbar spine resulting in artifact throughout this region. Small right pleural effusion layering dependently.  IMPRESSION: 1. The common bile duct is dilated to approximately 10 mm and there is moderate intrahepatic biliary ductal dilatation, indicative of biliary tract obstruction. This is associated with a long segment of narrowing over approximately 1.4 cm in the distal common bile duct immediately before the ampulla where  the duct tapers to approximately 1 mm in diameter. In the region of ductal narrowing there is some prominence of the surrounding soft tissues, which demonstrate very subtle differential enhancement. It is difficult to discern whether or  not this is simply a stricture, or if there is a small circumferential ampullary lesion in this region. Correlation with endoscopic ultrasound, ERCP and potential biopsy is recommended. 2. The appearance of the pancreas suggest either mild or resolving pancreatitis. No discrete peripancreatic fluid collection to suggest pseudocyst at this time. 3. Status post cholecystectomy. 4. Additional incidental findings, as above.   Electronically Signed   By: Vinnie Langton M.D.   On: 08/26/2014 08:41   Mr Abd W/wo Cm/mrcp  08/26/2014   CLINICAL DATA:  Evaluate for biliary tract obstruction.  EXAM: MRI ABDOMEN WITHOUT AND WITH CONTRAST (INCLUDING MRCP)  TECHNIQUE: Multiplanar multisequence MR imaging of the abdomen was performed both before and after the administration of intravenous contrast. Heavily T2-weighted images of the biliary and pancreatic ducts were obtained, and three-dimensional MRCP images were rendered by post processing.  CONTRAST:  57mL MULTIHANCE GADOBENATE DIMEGLUMINE 529 MG/ML IV SOLN  COMPARISON:  No priors. CT of the abdomen and pelvis without contrast 08/23/2014.  FINDINGS: Patient is status post cholecystectomy. MRCP images demonstrates moderate intra and extrahepatic biliary ductal dilatation. Common bile duct measures up to 10 mm distally, before it abruptly tapers to approximately 1 mm immediately adjacent to the level of the ampulla. This narrowing spans a length of approximately 1.4 cm, and there is mild soft tissue prominence around this narrowed area of the duct, which demonstrates very subtle differential enhancement compared to the remainder of the pancreatic parenchyma. While there is no definite pancreatic head mass, the possibility of a tiny ampullary  lesion is difficult to entirely exclude; however, these findings are favored to reflect a distal ductal stricture. Additionally, the pancreatic duct is not dilated. On image 31 of series 5 there is a tiny filling defect in the distal common bile duct, however, this is favored to be related to flow as there are no filling defects in this region on any of the other pulse sequences (including MRCP images) to suggest underlying ductal stone.  No suspicious hepatic lesion noted. There is slight heterogeneous signal intensity in a body and tail of the pancreas on T2 weighted images, with a small amount of fluid in the adjacent retroperitoneal regions, suggesting mild or resolving pancreatitis. This amorphous retroperitoneal fluid extends into the paracolic gutters bilaterally. No discrete peripancreatic fluid collections are noted at this time. The appearance of the spleen and bilateral adrenal glands is unremarkable. Multiple renal lesions are noted which are low T1 signal intensity, high T2 signal intensity, and do not enhance, compatible with simple cysts, largest of which measure up to 1.5 cm in diameter in the interpolar region of the right kidney.  Orthopedic fixation hardware in the visualized thoracolumbar spine resulting in artifact throughout this region. Small right pleural effusion layering dependently.  IMPRESSION: 1. The common bile duct is dilated to approximately 10 mm and there is moderate intrahepatic biliary ductal dilatation, indicative of biliary tract obstruction. This is associated with a long segment of narrowing over approximately 1.4 cm in the distal common bile duct immediately before the ampulla where the duct tapers to approximately 1 mm in diameter. In the region of ductal narrowing there is some prominence of the surrounding soft tissues, which demonstrate very subtle differential enhancement. It is difficult to discern whether or not this is simply a stricture, or if there is a small  circumferential ampullary lesion in this region. Correlation with endoscopic ultrasound, ERCP and potential biopsy is recommended. 2. The appearance of the pancreas suggest either mild or resolving pancreatitis.  No discrete peripancreatic fluid collection to suggest pseudocyst at this time. 3. Status post cholecystectomy. 4. Additional incidental findings, as above.   Electronically Signed   By: Vinnie Langton M.D.   On: 08/26/2014 08:41    Scheduled Meds: . [MAR HOLD] ALPRAZolam  1 mg Oral QID  . [facility] HOLD] atenolol  50 mg Oral Q breakfast  . [MAR HOLD] levothyroxine  100 mcg Oral QAC breakfast  . [MAR HOLD] metroNIDAZOLE  500 mg Oral 3 times per day  . [MAR HOLD] pantoprazole (PROTONIX) IV  40 mg Intravenous Q24H  . [MAR HOLD] piperacillin-tazobactam (ZOSYN)  IV  3.375 g Intravenous 3 times per day   Continuous Infusions: . sodium chloride Stopped (08/25/14 1504)  . sodium chloride    . sodium chloride 0.9 % 1,000 mL with potassium chloride 10 mEq infusion 75 mL/hr at 08/25/14 1504    Principal Problem:   Obstructive jaundice Active Problems:   Hypertension   GERD (gastroesophageal reflux disease)   Anxiety   IBS (irritable bowel syndrome)   Acute cholangitis   ARF (acute renal failure)   Coagulopathy   Protein-calorie malnutrition, severe   Rockwell Germany, PA-S   [facility] Pager [phone]. If 7PM-7AM, please contact night-coverage at [email or web address], password [facility] 08/26/2014, 9:42 AM  LOS: 3 days   Attending Patient was seen, examined,treatment plan was discussed with the Physician extender. I have directly reviewed the clinical findings, lab, imaging studies and management of this patient in detail. I have made the necessary changes to the above noted documentation, and agree with the documentation, as recorded by the Physician extender.  Nena Alexander MD [facility].

## 2014-08-26 NOTE — Transfer of Care (Signed)
Immediate Anesthesia Transfer of Care Note  Patient: [name] [name]  Procedure(s) Performed: Procedure(s): ENDOSCOPIC RETROGRADE CHOLANGIOPANCREATOGRAPHY (ERCP) (N/A)  Patient Location: Endoscopy Unit  Anesthesia Type:General  Level of Consciousness: awake, alert  and oriented  Airway & Oxygen Therapy: Patient Spontanous Breathing and Patient connected to nasal cannula oxygen  Post-op Assessment: Report given to PACU RN and Post -op Vital signs reviewed and stable  Post vital signs: Reviewed and stable  Complications: No apparent anesthesia complications

## 2014-08-26 NOTE — Anesthesia Procedure Notes (Signed)
Procedure Name: Intubation Date/Time: 08/26/2014 10:25 AM Performed by: Susa Loffler Pre-anesthesia Checklist: Patient identified, Patient being monitored, Emergency Drugs available, Timeout performed and Suction available Patient Re-evaluated:Patient Re-evaluated prior to inductionOxygen Delivery Method: Circle system utilized Preoxygenation: Pre-oxygenation with 100% oxygen Intubation Type: IV induction Ventilation: Mask ventilation without difficulty Laryngoscope Size: Mac and 3 Grade View: Grade II Tube type: Oral Tube size: 7.0 mm Airway Equipment and Method: Stylet Placement Confirmation: ETT inserted through vocal cords under direct vision,  positive ETCO2 and breath sounds checked- equal and bilateral Secured at: 22 cm Tube secured with: Tape Dental Injury: Teeth and Oropharynx as per pre-operative assessment

## 2014-08-26 NOTE — Anesthesia Postprocedure Evaluation (Signed)
Anesthesia Post Note  Patient: [name] [name]  Procedure(s) Performed: Procedure(s) (LRB): ENDOSCOPIC RETROGRADE CHOLANGIOPANCREATOGRAPHY (ERCP) (N/A)  Anesthesia type: general  Patient location: PACU  Post pain: Pain level controlled  Post assessment: Patient's Cardiovascular Status Stable  Last Vitals:  Filed Vitals:   08/26/14 1140  BP: 197/66  Pulse: 63  Temp:   Resp: 23    Post vital signs: Reviewed and stable  Level of consciousness: sedated  Complications: No apparent anesthesia complications

## 2014-08-27 ENCOUNTER — Encounter (HOSPITAL_COMMUNITY): Payer: Self-pay | Admitting: Gastroenterology

## 2014-08-27 DIAGNOSIS — K831 Obstruction of bile duct: Secondary | ICD-10-CM | POA: Diagnosis present

## 2014-08-27 DIAGNOSIS — A0472 Enterocolitis due to Clostridium difficile, not specified as recurrent: Secondary | ICD-10-CM

## 2014-08-27 LAB — HEPATIC FUNCTION PANEL
ALBUMIN: 1.7 g/dL — AB (ref 3.5–5.2)
ALT: 31 U/L (ref 0–35)
AST: 28 U/L (ref 0–37)
Alkaline Phosphatase: 338 U/L — ABNORMAL HIGH (ref 39–117)
BILIRUBIN INDIRECT: 1.5 mg/dL — AB (ref 0.3–0.9)
BILIRUBIN TOTAL: 5.4 mg/dL — AB (ref 0.3–1.2)
Bilirubin, Direct: 3.9 mg/dL — ABNORMAL HIGH (ref 0.0–0.3)
Total Protein: 5.2 g/dL — ABNORMAL LOW (ref 6.0–8.3)

## 2014-08-27 LAB — BASIC METABOLIC PANEL
Anion gap: 16 — ABNORMAL HIGH (ref 5–15)
BUN: 8 mg/dL (ref 6–23)
CALCIUM: 8.1 mg/dL — AB (ref 8.4–10.5)
CO2: 18 mEq/L — ABNORMAL LOW (ref 19–32)
Chloride: 111 mEq/L (ref 96–112)
Creatinine, Ser: 0.58 mg/dL (ref 0.50–1.10)
GFR, EST NON AFRICAN AMERICAN: 88 mL/min — AB (ref >90)
Glucose, Bld: 110 mg/dL — ABNORMAL HIGH (ref 70–99)
POTASSIUM: 3.4 meq/L — AB (ref 3.7–5.3)
Sodium: 145 mEq/L (ref 137–147)

## 2014-08-27 LAB — CBC
HEMATOCRIT: 27.8 % — AB (ref 36.0–46.0)
Hemoglobin: 9.1 g/dL — ABNORMAL LOW (ref 12.0–15.0)
MCH: 29.9 pg (ref 26.0–34.0)
MCHC: 32.7 g/dL (ref 30.0–36.0)
MCV: 91.4 fL (ref 78.0–100.0)
Platelets: 277 10*3/uL (ref 150–400)
RBC: 3.04 MIL/uL — ABNORMAL LOW (ref 3.87–5.11)
RDW: 14.8 % (ref 11.5–15.5)
WBC: 8.4 10*3/uL (ref 4.0–10.5)

## 2014-08-27 MED ORDER — DESVENLAFAXINE SUCCINATE ER 50 MG PO TB24
50.0000 mg | ORAL_TABLET | Freq: Every day | ORAL | Status: DC
  Administered 2014-08-27 – 2014-08-28 (×2): 50 mg via ORAL
  Filled 2014-08-27 (×3): qty 1

## 2014-08-27 MED ORDER — HYDROCHLOROTHIAZIDE 25 MG PO TABS
25.0000 mg | ORAL_TABLET | Freq: Every day | ORAL | Status: DC
Start: 2014-08-28 — End: 2014-08-27
  Filled 2014-08-27: qty 1

## 2014-08-27 MED ORDER — ALPRAZOLAM 0.5 MG PO TABS
1.0000 mg | ORAL_TABLET | Freq: Four times a day (QID) | ORAL | Status: DC
  Administered 2014-08-27 – 2014-08-28 (×4): 1 mg via ORAL
  Filled 2014-08-27 (×3): qty 2

## 2014-08-27 MED ORDER — ALPRAZOLAM 0.5 MG PO TABS: 1.0000 mg | ORAL_TABLET | Freq: Four times a day (QID) | ORAL | Status: DC

## 2014-08-27 MED ORDER — FUROSEMIDE 20 MG PO TABS
20.0000 mg | ORAL_TABLET | Freq: Every day | ORAL | Status: DC
  Administered 2014-08-28: 20 mg via ORAL
  Filled 2014-08-27 (×2): qty 1

## 2014-08-27 MED ORDER — POTASSIUM CHLORIDE CRYS ER 20 MEQ PO TBCR
40.0000 meq | EXTENDED_RELEASE_TABLET | Freq: Two times a day (BID) | ORAL | Status: DC
  Administered 2014-08-27 – 2014-08-28 (×3): 40 meq via ORAL
  Filled 2014-08-27 (×4): qty 2

## 2014-08-27 MED ORDER — HYDRALAZINE HCL 20 MG/ML IJ SOLN: 10.0000 mg | Freq: Four times a day (QID) | INTRAMUSCULAR | Status: DC | PRN

## 2014-08-27 NOTE — Progress Notes (Signed)
Physical Therapy Treatment Patient Details Name: [name] [name] MRN: [id number] DOB: September 08, 1936 Today's Date: 08/27/2014    History of Present Illness Patient is a 78 y/o female admitted with weakness, yellowish discoloration of skin, nausea and vomiting for 6 weeks. Pt had a fall with a fractured clavicle about 6 weeks ago and underwent open reduction and internal fixation 8/6. In ED, pt markedly jaundiced with a bilirubin of almost 9, a CT scan of the abdomen without contrast showed a possible distal CBD lesion with both extrahepatic intrahepatic ductal dilatation and 1.4 cm mass located near the junction of the common bile duct and the pancreatic duct. S/p ECRP 9/14.    PT Comments    Patient progressing well with mobility. Tolerated negotiating 1 flight of steps with Min guard assist and VC's for safety and technique. Pt with impaired muscular endurance post stair negotiation resulting in fatigue and muscle cramps on glutes upon return to room. Continues to require cues for safety. Husband present for treatment. Discussed disposition options. At this time, recommend 24/7 supervision at home with HHPT. Education provided on safety techniques if pt discharges home - no climbing steps holding sheets/laundry etc. Encourage sitting in chair for all meals and ambulating to bathroom with RN. Tech notified.  Will continue to follow and progress as tolerated.   Follow Up Recommendations  Home health PT;Supervision/Assistance - 24 hour     Equipment Recommendations  None recommended by PT    Recommendations for Other Services       Precautions / Restrictions Precautions Precautions: Fall Restrictions Weight Bearing Restrictions: No    Mobility  Bed Mobility Overal bed mobility: Modified Independent Bed Mobility: Rolling;Sidelying to Sit;Sit to Sidelying Rolling: Modified independent (Device/Increase time) Sidelying to sit: Modified independent (Device/Increase time)     Sit to  sidelying: Modified independent (Device/Increase time) General bed mobility comments: HOB flat, no use of rails to simulate home environment. Increased time to get to seated position but no physical assist required.  Transfers Overall transfer level: Needs assistance Equipment used: Rolling walker (2 wheeled) Transfers: Sit to/from Stand Sit to Stand: Supervision;Min guard         General transfer comment: Supervision for safety upon standing from EOB. VC for hand placement. Stood from low toilet with VC to use grab bar and Min guard assist.   Ambulation/Gait Ambulation/Gait assistance: Min guard Ambulation Distance (Feet): 250 Feet Assistive device: Rolling walker (2 wheeled) Gait Pattern/deviations: Step-through pattern;Decreased stride length Gait velocity: 1.3 ft/sec Gait velocity interpretation: <1.8 ft/sec, indicative of risk for recurrent falls General Gait Details: Pt with mild unsteadiness but no overt LOB. Min guard for safety. Fatigued post stair negotiation.   Stairs Stairs: Yes Stairs assistance: Min guard Stair Management: One rail Right;Step to pattern Number of Stairs: 11 General stair comments: VC for technique, use of BUEs on 1 rail for support. Slow speed. Rest break on top of steps.   Wheelchair Mobility    Modified Rankin (Stroke Patients Only)       Balance Overall balance assessment: Needs assistance   Sitting balance-Leahy Scale: Fair     Standing balance support: During functional activity Standing balance-Leahy Scale: Fair Standing balance comment: Able to perform dynamic standing activities at sink - washing hands/face without difficutly and no LOB, Min guard for safety. Requires use of UE support during gait training.                    Cognition Arousal/Alertness: Awake/alert Behavior During Therapy: [facility]  for tasks assessed/performed Overall Cognitive Status: Within Functional Limits for tasks assessed                       Exercises      General Comments General comments (skin integrity, edema, etc.): Discussed disposition options with husband and patient. Pt wants to return home, husband apprehensive about home due to multiple fall history.      Pertinent Vitals/Pain Pain Assessment: No/denies pain    Home Living                      Prior Function            PT Goals (current goals can now be found in the care plan section) Progress towards PT goals: Progressing toward goals    Frequency       PT Plan Current plan remains appropriate    Co-evaluation             End of Session Equipment Utilized During Treatment: Gait belt Activity Tolerance: Patient limited by fatigue Patient left: in bed;with call bell/phone within reach;with bed alarm set;with family/visitor present     Time: 1245-8099 PT Time Calculation (min): 32 min  Charges:  $Gait Training: 8-22 mins $Therapeutic Activity: 8-22 mins                    G CodesCandy Sledge A 2014-09-23, 12:39 PM Candy Sledge, [place], DPT [phone]

## 2014-08-27 NOTE — Progress Notes (Signed)
Clinical Social Work Department CLINICAL SOCIAL WORK PLACEMENT NOTE 08/27/2014  Patient:  GABRIELLAH, RABEL  Account Number:  [id number] Admit date:  08/23/2014  Clinical Social Worker:  Berton Mount, Latanya Presser  Date/time:  08/27/2014 11:30 AM  Clinical Social Work is seeking post-discharge placement for this patient at the following level of care:   [place]   (*CSW will update this form in Epic as items are completed)   08/27/2014  Patient/family provided with [facility] Department of Clinical Social Work's list of facilities offering this level of care within the geographic area requested by the patient (or if unable, by the patient's family).  08/27/2014  Patient/family informed of their freedom to choose among providers that offer the needed level of care, that participate in Medicare, Medicaid or managed care program needed by the patient, have an available bed and are willing to accept the patient.  08/27/2014  Patient/family informed of [facility]' ownership interest in [facility], as well as of the fact that they are under no obligation to receive care at this facility.  PASARR submitted to EDS on Existing PASARR number received on   FL2 transmitted to all facilities in geographic area requested by pt/family on  08/27/2014 FL2 transmitted to all facilities within larger geographic area on   Patient informed that his/her managed care company has contracts with or will negotiate with  certain facilities, including the following:     Patient/family informed of bed offers received:   Patient chooses bed at  Physician recommends and patient chooses bed at    Patient to be transferred to  on   Patient to be transferred to facility by  Patient and family notified of transfer on  Name of family member notified:    The following physician request were entered in Epic: Physician Request  Please sign FL2.    Additional CommentsBerton Mount,  [place]

## 2014-08-27 NOTE — Progress Notes (Addendum)
PROGRESS NOTE  [name] [name] HYQ:[id number] DOB: 03/27/1936 DOA: 08/23/2014 PCP: Horton Finer, MD  HPI/Subjective: Patient seems to have improved.  Appears more alert.  Decreased jaundice compared to yesterday.   Assessment/Plan:  Obstructive jaundice  -Secondary to CBD Stricture (?Malignancy). -GI consulted-MRCP shows no stones, but CBD dilation and a 1.4 cm segment of stricture  -ERCP with stent placement 08/26/2014.  No post procedure complications.  -Await cytology/brushing results.  -Bilirubin trending downward.  5.4 on 9/15.  Suspected Acute cholangitis  -Given history of chills, leukocytosis-suspect some amount of cholangitis started empirically on Zosyn.  D/C'd 9/14. -Remains afebrile since admission, leukocytosis down trending.Unfortunately developed slight worsening of chronic diarrhea -ERCP showed no signs of infection  Zosyn discontinued on 9/14.  -Blood Cultures (9/11) - no growth to date.   C Diff Colitis  -developed slight worsening of diarrhea post admission-now better and back to usual baseline.  -C Diff PCR positive on 9/12. Has chronic diarrhea from what diarrhea predominant IBS.  -Continue Flagyl -will plan for 14 days treatment from 9/14   ARF (acute renal failure)  - Likely prerenal, given poor oral intake, use of diuretics and acute illness.  - Resolved with IVF   Hypokalemia  - Secondary to diarrhea/diuretics  - Continue to replete and recheck in am   Coagulopathy  -Secondary to obstructive jaundice  -INR much better following Vit K   Hypertension  - Moderately well controlled with Atenolol. Restarted diuretics. - Continue to monitor. And administer Labetalol prn to maintain control.  IBS (irritable bowel syndrome)  - Per history-diarrhea predominant irritable bowel syndrome  Frequent falls  - Likely secondary to generalized weakness from acute illness/dehydration.  - Hydrate, treat underlying acute illness, PT evaluation completed -  24 assistance recommended.  Will go to SNF. - CT of the head and CT of the cervical spine both negative for acute abnormalities.   GERD (gastroesophageal reflux disease)  - Stable, continue PPI.   Anxiety  - On chronic Xanax at home-will continue   Severe malnutrition in the context of acute illness or injury  -Supplements per nutrition  DVT Prophylaxis:  SCDs  Code Status: Full Family Communication: Husband at bedside. Disposition Plan: Discharge to SNF tomorrow.  Consultants:  Gastroenterology  Procedures:  MRCP, ERCP with sphincterotomy and stent placement  Antibiotics: Anti-infectives   Start     Dose/Rate Route Frequency Ordered Stop   08/24/14 1400  metroNIDAZOLE (FLAGYL) tablet 500 mg     500 mg Oral 3 times per day 08/24/14 1350 09/07/14 1359   08/23/14 1845  [MAR Hold]  piperacillin-tazobactam (ZOSYN) IVPB 3.375 g  Status:  Discontinued     (On MAR Hold since 08/26/14 0931)   3.375 g 12.5 mL/hr over 240 Minutes Intravenous 3 times per day 08/23/14 1836 08/26/14 1135      Objective: Filed Vitals:   08/26/14 2117 08/27/14 0602 08/27/14 0642 08/27/14 0800  BP: 188/62 198/66 162/65 136/71  Pulse: 71 66 68 85  Temp: 98.4 F (36.9 C) 98 F (36.7 C)  97.9 F (36.6 C)  TempSrc: Oral Oral  Oral  Resp: 28 20  18   Height:      Weight:      SpO2: 99% 100%  95%    Intake/Output Summary (Last 24 hours) at 08/27/14 1218 Last data filed at 08/26/14 1550  Gross per 24 hour  Intake    240 ml  Output    200 ml  Net     40 ml  Filed Weights   08/23/14 1156 08/23/14 1929  Weight: 59.421 kg (131 lb) 63.05 kg (139 lb)    Exam: General: Elderly female lying in bed.  Awake and alert, NAD, Appears jaundiced. Smiling pleasant HEENT:  Icteic Sclera.   EOMI Cardiovascular: RRR, S1 S2 auscultated, no rubs, murmurs or gallops.   Respiratory: Clear to auscultation bilaterally with equal chest rise.  No abnormal breath sounds.   Abdomen: Soft, nontender, nondistended, +  bowel sounds.  Extremities: lower extremities warm dry without cyanosis clubbing or edema.  Neuro: AAOx3, cranial nerves grossly intact. Psych: Normal affect and demeanor with intact judgement and insight.  Data Reviewed: Basic Metabolic Panel:  Recent Labs Lab 08/23/14 1237 08/24/14 0620 08/25/14 0850 08/26/14 1343 08/27/14 0719  NA 138 141 143 141 145  K 3.1* 2.9* 3.3* 3.7 3.4*  CL 100 108 111 108 111  CO2 20 16* 19 20 18*  GLUCOSE 87 89 94 113* 110*  BUN 57* 28* 19 12 8   CREATININE 1.81* 0.84 0.67 0.61 0.58  CALCIUM 9.0 8.0* 8.5 8.6 8.1*   Liver Function Tests:  Recent Labs Lab 08/23/14 1237 08/24/14 0620 08/25/14 0850 08/26/14 1343 08/27/14 0719  AST 89* 64* 37 28 28  ALT 56* 47* 40* 35 31  ALKPHOS 342* 367* 345* 364* 338*  BILITOT 9.2* 8.5* 9.6* 6.7* 5.4*  PROT 6.7 5.5* 5.3* 5.6* 5.2*  ALBUMIN 2.2* 1.8* 1.7* 1.7* 1.7*    Recent Labs Lab 08/23/14 1237 08/25/14 0850  LIPASE 810* 51   CBC:  Recent Labs Lab 08/23/14 1237 08/24/14 0620 08/25/14 0850 08/26/14 1343 08/27/14 0719  WBC 14.2* 13.6* 9.2 7.6 8.4  NEUTROABS 11.1*  --   --   --   --   HGB 10.9* 9.7* 9.4* 9.8* 9.1*  HCT 32.4* 29.1* 28.3* 29.6* 27.8*  MCV 88.5 88.2 88.2 89.4 91.4  PLT 197 199 214 260 277    Recent Results (from the past 240 hour(s))  CULTURE, BLOOD (ROUTINE X 2)     Status: None   Collection Time    08/23/14  8:21 PM      Result Value Ref Range Status   Specimen Description BLOOD RIGHT HAND   Final   Special Requests BOTTLES DRAWN AEROBIC ONLY 2CC   Final   Culture  Setup Time     Final   Value: 08/24/2014 02:06     Performed at [facility]   Culture     Final   Value:        BLOOD CULTURE RECEIVED NO GROWTH TO DATE CULTURE WILL BE HELD FOR 5 DAYS BEFORE ISSUING A FINAL NEGATIVE REPORT     Performed at [facility]   Report Status PENDING   Incomplete  CULTURE, BLOOD (ROUTINE X 2)     Status: None   Collection Time    08/23/14  8:26 PM      Result  Value Ref Range Status   Specimen Description BLOOD LEFT HAND   Final   Special Requests BOTTLES DRAWN AEROBIC ONLY 2CC   Final   Culture  Setup Time     Final   Value: 08/24/2014 02:07     Performed at [facility]   Culture     Final   Value:        BLOOD CULTURE RECEIVED NO GROWTH TO DATE CULTURE WILL BE HELD FOR 5 DAYS BEFORE ISSUING A FINAL NEGATIVE REPORT     Performed at [facility]  Report Status PENDING   Incomplete  CLOSTRIDIUM DIFFICILE BY PCR     Status: Abnormal   Collection Time    08/24/14  2:51 AM      Result Value Ref Range Status   C difficile by pcr POSITIVE (*) NEGATIVE Final   Comment: CRITICAL RESULT CALLED TO, READ BACK BY AND VERIFIED WITH:     ZELLNER R.,RN 08/24/14 1231 BY JONESJ     Studies: Mr 3d Recon At Scanner  09-04-14   CLINICAL DATA:  Evaluate for biliary tract obstruction.  EXAM: MRI ABDOMEN WITHOUT AND WITH CONTRAST (INCLUDING MRCP)  TECHNIQUE: Multiplanar multisequence MR imaging of the abdomen was performed both before and after the administration of intravenous contrast. Heavily T2-weighted images of the biliary and pancreatic ducts were obtained, and three-dimensional MRCP images were rendered by post processing.  CONTRAST:  26mL MULTIHANCE GADOBENATE DIMEGLUMINE 529 MG/ML IV SOLN  COMPARISON:  No priors. CT of the abdomen and pelvis without contrast 08/23/2014.  FINDINGS: Patient is status post cholecystectomy. MRCP images demonstrates moderate intra and extrahepatic biliary ductal dilatation. Common bile duct measures up to 10 mm distally, before it abruptly tapers to approximately 1 mm immediately adjacent to the level of the ampulla. This narrowing spans a length of approximately 1.4 cm, and there is mild soft tissue prominence around this narrowed area of the duct, which demonstrates very subtle differential enhancement compared to the remainder of the pancreatic parenchyma. While there is no definite pancreatic head mass, the  possibility of a tiny ampullary lesion is difficult to entirely exclude; however, these findings are favored to reflect a distal ductal stricture. Additionally, the pancreatic duct is not dilated. On image 31 of series 5 there is a tiny filling defect in the distal common bile duct, however, this is favored to be related to flow as there are no filling defects in this region on any of the other pulse sequences (including MRCP images) to suggest underlying ductal stone.  No suspicious hepatic lesion noted. There is slight heterogeneous signal intensity in a body and tail of the pancreas on T2 weighted images, with a small amount of fluid in the adjacent retroperitoneal regions, suggesting mild or resolving pancreatitis. This amorphous retroperitoneal fluid extends into the paracolic gutters bilaterally. No discrete peripancreatic fluid collections are noted at this time. The appearance of the spleen and bilateral adrenal glands is unremarkable. Multiple renal lesions are noted which are low T1 signal intensity, high T2 signal intensity, and do not enhance, compatible with simple cysts, largest of which measure up to 1.5 cm in diameter in the interpolar region of the right kidney.  Orthopedic fixation hardware in the visualized thoracolumbar spine resulting in artifact throughout this region. Small right pleural effusion layering dependently.  IMPRESSION: 1. The common bile duct is dilated to approximately 10 mm and there is moderate intrahepatic biliary ductal dilatation, indicative of biliary tract obstruction. This is associated with a long segment of narrowing over approximately 1.4 cm in the distal common bile duct immediately before the ampulla where the duct tapers to approximately 1 mm in diameter. In the region of ductal narrowing there is some prominence of the surrounding soft tissues, which demonstrate very subtle differential enhancement. It is difficult to discern whether or not this is simply a  stricture, or if there is a small circumferential ampullary lesion in this region. Correlation with endoscopic ultrasound, ERCP and potential biopsy is recommended. 2. The appearance of the pancreas suggest either mild or resolving pancreatitis. No discrete  peripancreatic fluid collection to suggest pseudocyst at this time. 3. Status post cholecystectomy. 4. Additional incidental findings, as above.   Electronically Signed   By: Vinnie Langton M.D.   On: 08/26/2014 08:41   Dg Ercp Biliary & Pancreatic Ducts  08/26/2014   CLINICAL DATA:  Biliary obstruction.  EXAM: ERCP  TECHNIQUE: Multiple spot images obtained with the fluoroscopic device and submitted for interpretation post-procedure.  COMPARISON:  None.  FINDINGS: Images demonstrate cannulation of the common bile duct, contrast filling a dilated biliary tree, and placement of a stent across the ampulla of. Balloon stone extraction is also noted.  IMPRESSION: ERCP.  These images were submitted for radiologic interpretation only. Please see the procedural report for the amount of contrast and the fluoroscopy time utilized.   Electronically Signed   By: Maryclare Bean M.D.   On: 08/26/2014 12:50   Mr Abd W/wo Cm/mrcp  08/26/2014   CLINICAL DATA:  Evaluate for biliary tract obstruction.  EXAM: MRI ABDOMEN WITHOUT AND WITH CONTRAST (INCLUDING MRCP)  TECHNIQUE: Multiplanar multisequence MR imaging of the abdomen was performed both before and after the administration of intravenous contrast. Heavily T2-weighted images of the biliary and pancreatic ducts were obtained, and three-dimensional MRCP images were rendered by post processing.  CONTRAST:  42mL MULTIHANCE GADOBENATE DIMEGLUMINE 529 MG/ML IV SOLN  COMPARISON:  No priors. CT of the abdomen and pelvis without contrast 08/23/2014.  FINDINGS: Patient is status post cholecystectomy. MRCP images demonstrates moderate intra and extrahepatic biliary ductal dilatation. Common bile duct measures up to 10 mm distally,  before it abruptly tapers to approximately 1 mm immediately adjacent to the level of the ampulla. This narrowing spans a length of approximately 1.4 cm, and there is mild soft tissue prominence around this narrowed area of the duct, which demonstrates very subtle differential enhancement compared to the remainder of the pancreatic parenchyma. While there is no definite pancreatic head mass, the possibility of a tiny ampullary lesion is difficult to entirely exclude; however, these findings are favored to reflect a distal ductal stricture. Additionally, the pancreatic duct is not dilated. On image 31 of series 5 there is a tiny filling defect in the distal common bile duct, however, this is favored to be related to flow as there are no filling defects in this region on any of the other pulse sequences (including MRCP images) to suggest underlying ductal stone.  No suspicious hepatic lesion noted. There is slight heterogeneous signal intensity in a body and tail of the pancreas on T2 weighted images, with a small amount of fluid in the adjacent retroperitoneal regions, suggesting mild or resolving pancreatitis. This amorphous retroperitoneal fluid extends into the paracolic gutters bilaterally. No discrete peripancreatic fluid collections are noted at this time. The appearance of the spleen and bilateral adrenal glands is unremarkable. Multiple renal lesions are noted which are low T1 signal intensity, high T2 signal intensity, and do not enhance, compatible with simple cysts, largest of which measure up to 1.5 cm in diameter in the interpolar region of the right kidney.  Orthopedic fixation hardware in the visualized thoracolumbar spine resulting in artifact throughout this region. Small right pleural effusion layering dependently.  IMPRESSION: 1. The common bile duct is dilated to approximately 10 mm and there is moderate intrahepatic biliary ductal dilatation, indicative of biliary tract obstruction. This is  associated with a long segment of narrowing over approximately 1.4 cm in the distal common bile duct immediately before the ampulla where the duct tapers to approximately 1  mm in diameter. In the region of ductal narrowing there is some prominence of the surrounding soft tissues, which demonstrate very subtle differential enhancement. It is difficult to discern whether or not this is simply a stricture, or if there is a small circumferential ampullary lesion in this region. Correlation with endoscopic ultrasound, ERCP and potential biopsy is recommended. 2. The appearance of the pancreas suggest either mild or resolving pancreatitis. No discrete peripancreatic fluid collection to suggest pseudocyst at this time. 3. Status post cholecystectomy. 4. Additional incidental findings, as above.   Electronically Signed   By: Vinnie Langton M.D.   On: 08/26/2014 08:41    Scheduled Meds: . ALPRAZolam  1 mg Oral QID  . atenolol  50 mg Oral Q breakfast  . desvenlafaxine  50 mg Oral Daily  . [START ON 08/28/2014] furosemide  20 mg Oral Q breakfast  . [START ON 08/28/2014] hydrochlorothiazide  25 mg Oral Q breakfast  . levothyroxine  100 mcg Oral QAC breakfast  . metroNIDAZOLE  500 mg Oral 3 times per day  . pantoprazole  40 mg Oral QHS  . potassium chloride SA  40 mEq Oral BID   Continuous Infusions: . sodium chloride 0.9 % 1,000 mL with potassium chloride 10 mEq infusion 75 mL/hr at 08/26/14 2348    Principal Problem:   Obstructive jaundice Active Problems:   Common bile duct (CBD) stricture   Hypertension   GERD (gastroesophageal reflux disease)   Anxiety   IBS (irritable bowel syndrome)   Acute cholangitis   ARF (acute renal failure)   Coagulopathy   Protein-calorie malnutrition, severe   Enteritis due to Clostridium difficile   Rockwell Germany, PA-S [facility] Pager [phone]. If 7PM-7AM, please contact night-coverage at [email or web address], password [facility] 08/27/2014,  12:18 PM  LOS: 4 days   Attending Patient was seen, examined,treatment plan was discussed with the Physician extender. I have directly reviewed the clinical findings, lab, imaging studies and management of this patient in detail. I have made the necessary changes to the above noted documentation, and agree with the documentation, as recorded by the Physician extender.  Nena Alexander MD [facility].

## 2014-08-27 NOTE — Progress Notes (Signed)
[name] 10:57 AM  Subjective: Patient without complaints feeling better and eating better and no obvious post ERCP problems  Objective: Vital signs stable afebrile no acute distress abdomen is soft nontender liver tests decreased  Assessment: Distal biliary stricture awaiting cytology  Plan: Okay to go to rehabilitation we wait on her cytology and then we will see her back in one week to decide the timing of any further workup and plans to include possibly EUS and stent change and possibly intraduct endoscopy as well and all was discussed with her husband who agreed  [facility]

## 2014-08-28 DIAGNOSIS — E876 Hypokalemia: Secondary | ICD-10-CM

## 2014-08-28 LAB — COMPREHENSIVE METABOLIC PANEL
ALBUMIN: 1.8 g/dL — AB (ref 3.5–5.2)
ALK PHOS: 361 U/L — AB (ref 39–117)
ALT: 29 U/L (ref 0–35)
AST: 29 U/L (ref 0–37)
Anion gap: 13 (ref 5–15)
BUN: 5 mg/dL — ABNORMAL LOW (ref 6–23)
CO2: 20 mEq/L (ref 19–32)
Calcium: 7.9 mg/dL — ABNORMAL LOW (ref 8.4–10.5)
Chloride: 109 mEq/L (ref 96–112)
Creatinine, Ser: 0.56 mg/dL (ref 0.50–1.10)
GFR calc Af Amer: >90 mL/min (ref >90)
GFR calc non Af Amer: 89 mL/min — ABNORMAL LOW (ref >90)
Glucose, Bld: 118 mg/dL — ABNORMAL HIGH (ref 70–99)
POTASSIUM: 3.6 meq/L — AB (ref 3.7–5.3)
SODIUM: 142 meq/L (ref 137–147)
Total Bilirubin: 4.3 mg/dL — ABNORMAL HIGH (ref 0.3–1.2)
Total Protein: 5.2 g/dL — ABNORMAL LOW (ref 6.0–8.3)

## 2014-08-28 MED ORDER — AMLODIPINE BESYLATE 10 MG PO TABS: 10.0000 mg | ORAL_TABLET | Freq: Every day | ORAL | Status: DC

## 2014-08-28 MED ORDER — METRONIDAZOLE 500 MG PO TABS: 500.0000 mg | ORAL_TABLET | Freq: Three times a day (TID) | ORAL | Status: DC

## 2014-08-28 MED ORDER — ATENOLOL 50 MG PO TABS: 100.0000 mg | ORAL_TABLET | Freq: Every day | ORAL | Status: DC

## 2014-08-28 MED ORDER — AMLODIPINE BESYLATE 10 MG PO TABS
10.0000 mg | ORAL_TABLET | Freq: Every day | ORAL | Status: DC
  Administered 2014-08-28: 10 mg via ORAL
  Filled 2014-08-28: qty 1

## 2014-08-28 NOTE — Discharge Instructions (Signed)
Continue to increase activity as tolerated.

## 2014-08-28 NOTE — Progress Notes (Deleted)
Spoke to the patient and father about administering the tap water enema and educated them on how it works. Patient refused the enema and stated that it was unnecessary.

## 2014-08-28 NOTE — Progress Notes (Signed)
Transaminases remain nl, bilirubin continues to creep downward (currently 4.3).  Pt appears comfortable and abd is nontender.  Spoke w/ pt, husband and dtr at bedside:   Biliary cytology showed "atypical" cells, but on speaking w/ the pathologist, he feels this is probably reactive, low likelihood of them being malignant.     Plan:    1.  OK for dischg from GI standpoint 2.  Our office will contact pt/husband in the next few days to arrange an EUS/ERCP to be done sometime in the next few weeks, to further characterize the cause of the biliary stricture and decide on what type of further stenting (if any) to do.  Call me if questions or if I can be of further help.  Cleotis Nipper, M.D. [phone]

## 2014-08-28 NOTE — Progress Notes (Signed)
Pt BP 195/72. PRN labetolol requested from pharmacy. Med delay from pharmacy. Rechecked BP 158/66. Will continue to monitor pt.

## 2014-08-28 NOTE — Progress Notes (Signed)
[name] [name] to be D/C'd Home per MD order.  Discussed with the patient and family all questions fully answered.    Medication List    STOP taking these medications       hydrochlorothiazide 25 MG tablet  Commonly known as:  HYDRODIURIL      TAKE these medications       ALPRAZolam 1 MG tablet  Commonly known as:  XANAX  Take 1 mg by mouth 4 (four) times daily. As needed     amLODipine 10 MG tablet  Commonly known as:  NORVASC  Take 1 tablet (10 mg total) by mouth daily.     atenolol 50 MG tablet  Commonly known as:  TENORMIN  Take 2 tablets (100 mg total) by mouth daily with breakfast.     desvenlafaxine 50 MG 24 hr tablet  Commonly known as:  PRISTIQ  Take 50 mg by mouth daily.     ENLYTE Caps  Take 1 tablet by mouth daily.     esomeprazole 40 MG capsule  Commonly known as:  NEXIUM  Take 40 mg by mouth daily before breakfast.     fenofibrate 145 MG tablet  Commonly known as:  TRICOR  Take 145 mg by mouth daily with breakfast.     furosemide 20 MG tablet  Commonly known as:  LASIX  Take 20 mg by mouth daily with breakfast.     gabapentin 400 MG capsule  Commonly known as:  NEURONTIN  Take 400 mg by mouth 6 (six) times daily.     hyoscyamine 0.375 MG 12 hr tablet  Commonly known as:  LEVBID  Take 0.375 mg by mouth every 12 (twelve) hours as needed. For stomach cramps     levothyroxine 100 MCG tablet  Commonly known as:  SYNTHROID, LEVOTHROID  Take 100 mcg by mouth daily before breakfast.     metroNIDAZOLE 500 MG tablet  Commonly known as:  FLAGYL  Take 1 tablet (500 mg total) by mouth every 8 (eight) hours.     multivitamin with minerals Tabs tablet  Take 1 tablet by mouth daily.     ondansetron 4 MG tablet  Commonly known as:  ZOFRAN  Take 1 tablet (4 mg total) by mouth every 8 (eight) hours as needed for nausea or vomiting.     oxyCODONE-acetaminophen 5-325 MG per tablet  Commonly known as:  ROXICET  Take 1-2 tablets by mouth every 4 (four) hours  as needed.     potassium chloride SA 20 MEQ tablet  Commonly known as:  K-DUR,KLOR-CON  Take 40 mEq by mouth 2 (two) times daily.     tiZANidine 4 MG tablet  Commonly known as:  ZANAFLEX  Take 4 mg by mouth every 8 (eight) hours as needed for muscle spasms.     traMADol 50 MG tablet  Commonly known as:  ULTRAM  Take 50 mg by mouth every 6 (six) hours as needed.     zolpidem 10 MG tablet  Commonly known as:  AMBIEN  Take 10-20 mg by mouth at bedtime.        VVS, Skin clean, dry and intact without evidence of skin break down, no evidence of skin tears noted. IV catheter discontinued intact. Site without signs and symptoms of complications. Dressing and pressure applied.  An After Visit Summary was printed and given to the patient.  D/c education completed with patient/family including follow up instructions, medication list, d/c activities limitations if indicated, with other d/c instructions as  indicated by MD - patient able to verbalize understanding, all questions fully answered.   Patient instructed to return to ED, call 911, or call MD for any changes in condition.   Patient escorted via [place], and D/C home via private auto.  [name] L 08/28/2014 1:46 PM

## 2014-08-28 NOTE — Clinical Social Work Note (Signed)
This CSW spoke with patient and her husband at bedside this morning to advise about SNF bed confirmation at [facility]. Patient up ambulating from bathroom to sink independently- they advise CSW they have decided to go home with [facility] and not SNF- CSW will update SNF and RNCM- CSW will sign off-  Eduard Clos, MSW, [place]

## 2014-08-28 NOTE — Discharge Summary (Signed)
Physician Discharge Summary  [name] [name]:[id number] DOB: 08-06-1936 DOA: 08/23/2014  PCP: Horton Finer, MD  Admit date: 08/23/2014 Discharge date: 08/28/2014  Time spent: 45 minutes  Recommendations for Outpatient Follow-up:  1. Bile duct brushings showed atypical cells.  Meet with Dr. Watt Climes to determine next appropriate procedure. 2. Ensure bilirubin levels continue to decrease.   3. Discharge to home with homehealth (refused SNF).    Discharge Diagnoses:  Principal Problem:   Obstructive jaundice Active Problems:   Hypertension   GERD (gastroesophageal reflux disease)   Anxiety   IBS (irritable bowel syndrome)   Acute cholangitis   ARF (acute renal failure)   Coagulopathy   Protein-calorie malnutrition, severe   Enteritis due to Clostridium difficile   Common bile duct (CBD) stricture   Hypokalemia   Discharge Condition: stable    Diet recommendation: Heart healthy diet  Filed Weights   08/23/14 1156 08/23/14 1929  Weight: 59.421 kg (131 lb) 63.05 kg (139 lb)    History of present illness:  [name] [name] is a 78 y.o. female with a Past Medical History of hypertension, irritable bowel syndrome, hypothyroidism, gastroesophageal reflux disease who presented to ED with complaints of weakness, yellow discoloration of skin, nausea and vomiting x 6 weeks. Per patient, in early August she fell and broke her clavicle- for which she underwent open reduction and internal fixation on 07/18/14. She claims that immediately following this procedure she was in her usual state of health and without major complaints. A few weeks later, she then started developing weakness, loss of appetite. This was then associated with nausea and a few episodes of vomiting over the past few weeks. She denies any abdominal pain. She's also noticed that her stools are light yellow/brown in color for the past 2-3 weeks. She has had some intermittent itching as well. She initially refused to seek  medical, however after her husband noted that she was jaundiced 2-3 days back, she reluctantly agreed to come to the emergency room today. Patient says husband also give a history of having "chills"-lasting hours almost on a daily basis, however they have not checked her temperature at home. In the ED, she was found to be markedly jaundiced with a bilirubin of almost 9, a CT scan of the abdomen without contrast showed a possible distal CBD lesion with both extrahepatic intrahepatic ductal dilatation. I was asked to admit this patient for further evaluation and treatment.   Please note, patient denies any abdominal pain. She has irritable bowel syndrome and has approximately 2-3 bowel movements at baseline. She has approximately lost 8-10 pounds in the past 6 weeks.   Patient also gives a history of worsening weakness for the past 6 weeks, this has apparently resulted in 2-3 falls as well. No syncopal episodes however. Husband claims, that at times he needs to lift her out of bed.   Hospital Course:  Obstructive jaundice  -Secondary to CBD Stricture (?Malignancy).  -GI consulted-MRCP shows no stones, but CBD dilation and a 1.4 cm segment of stricture  -ERCP with stent placement 08/26/2014. No post procedure complications.  -Cytology/brushing results showed atypical cells not diagnostic of malignancy.  Will need further evaluation to rule out malignancy-deferred to outpatient setting -Bilirubin trending downward. 4.3 on 9/16.   Suspected Acute cholangitis  -Given history of chills, leukocytosis-suspect some amount of cholangitis started empirically on Zosyn. D/C'd 9/14.  -Remains afebrile since admission, leukocytosis down trending.Unfortunately developed slight worsening of chronic diarrhea  -ERCP showed no signs of infection  Zosyn discontinued on 9/14.  -Blood Cultures (9/11) - no growth to date.   C Diff Colitis  -developed slight worsening of diarrhea post admission-now better and back to  usual baseline.  -C Diff PCR positive on 9/12. Has chronic diarrhea from what diarrhea predominant IBS.  -Continue Flagyl -will plan for 14 days treatment from 9/14   ARF (acute renal failure)  - Likely prerenal, given poor oral intake, use of diuretics and acute illness.  - Resolved with IVF   Hypokalemia  - Secondary to diarrhea/diuretics  - Trending upwards with re-starting the patients K-Dur. 3.6 on 9/16.  Coagulopathy  -Secondary to obstructive jaundice   -INR much better following Vit K   Hypertension  - Moderately well controlled with Atenolol. Restarted diuretics. Administered Labetalol to maintain control. - Elevated blood pressure.  Will discharge home adding Amlodipine to current home regimen and increasing Atenolol while discontinuing HCTZ.     IBS (irritable bowel syndrome)  - Per history-diarrhea predominant irritable bowel syndrome   Frequent falls  - Likely secondary to generalized weakness from acute illness/dehydration.  - Hydrate, treat underlying acute illness, PT evaluation completed - 24 assistance recommended. Will go to SNF.  - CT of the head and CT of the cervical spine both negative for acute abnormalities.   GERD (gastroesophageal reflux disease)  - Stable, continue PPI.   Anxiety  - On chronic Xanax at home-will continue   Severe malnutrition in the context of acute illness or injury  -Supplements per nutrition  Procedures:  MRCP, ERCP with sphincterotomy and stent placement    Consultations:  Gastroenterology  Discharge Exam: Filed Vitals:   08/28/14 1039  BP: 190/62  Pulse:   Temp:   Resp:     General: Elderly female lying in bed. Awake and alert, NAD, Appears slightly jaundiced. Smiling pleasant, eating breakfast.  HEENT: Icteic Sclera.  Cardiovascular: RRR, S1 S2 auscultated, no rubs, murmurs or gallops.  Respiratory: Clear to auscultation bilaterally with equal chest rise. No abnormal breath sounds.  Abdomen: Soft, nontender,  nondistended, + bowel sounds.  Extremities: lower extremities warm dry without cyanosis clubbing or edema.  Neuro: AAOx3, cranial nerves grossly intact.  Psych: Normal affect and demeanor with intact judgement and insight.  Discharge Instructions Discharge Instructions   Call MD for:  persistant nausea and vomiting    Complete by:  As directed      Call MD for:  severe uncontrolled pain    Complete by:  As directed      Call MD for:  temperature >100.4    Complete by:  As directed      Diet - low sodium heart healthy    Complete by:  As directed      Increase activity slowly    Complete by:  As directed           Current Discharge Medication List    START taking these medications   Details  amLODipine (NORVASC) 10 MG tablet Take 1 tablet (10 mg total) by mouth daily. Qty: 30 tablet, Refills: 6    metroNIDAZOLE (FLAGYL) 500 MG tablet Take 1 tablet (500 mg total) by mouth every 8 (eight) hours. Qty: 39 tablet, Refills: 0      CONTINUE these medications which have CHANGED   Details  atenolol (TENORMIN) 50 MG tablet Take 2 tablets (100 mg total) by mouth daily with breakfast. Qty: 60 tablet, Refills: 3      CONTINUE these medications which have NOT CHANGED   Details  ALPRAZolam (XANAX) 1 MG tablet Take 1 mg by mouth 4 (four) times daily. As needed    desvenlafaxine (PRISTIQ) 50 MG 24 hr tablet Take 50 mg by mouth daily.    Dietary Management Product (ENLYTE) CAPS Take 1 tablet by mouth daily.    esomeprazole (NEXIUM) 40 MG capsule Take 40 mg by mouth daily before breakfast.    fenofibrate (TRICOR) 145 MG tablet Take 145 mg by mouth daily with breakfast.     furosemide (LASIX) 20 MG tablet Take 20 mg by mouth daily with breakfast.     gabapentin (NEURONTIN) 400 MG capsule Take 400 mg by mouth 6 (six) times daily.    hyoscyamine (LEVBID) 0.375 MG 12 hr tablet Take 0.375 mg by mouth every 12 (twelve) hours as needed. For stomach cramps    levothyroxine (SYNTHROID,  LEVOTHROID) 100 MCG tablet Take 100 mcg by mouth daily before breakfast.    Multiple Vitamin (MULTIVITAMIN WITH MINERALS) TABS tablet Take 1 tablet by mouth daily.    ondansetron (ZOFRAN) 4 MG tablet Take 1 tablet (4 mg total) by mouth every 8 (eight) hours as needed for nausea or vomiting. Qty: 40 tablet, Refills: 0    oxyCODONE-acetaminophen (ROXICET) 5-325 MG per tablet Take 1-2 tablets by mouth every 4 (four) hours as needed. Qty: 60 tablet, Refills: 0    potassium chloride SA (K-DUR,KLOR-CON) 20 MEQ tablet Take 40 mEq by mouth 2 (two) times daily.     tiZANidine (ZANAFLEX) 4 MG tablet Take 4 mg by mouth every 8 (eight) hours as needed for muscle spasms.    traMADol (ULTRAM) 50 MG tablet Take 50 mg by mouth every 6 (six) hours as needed.    zolpidem (AMBIEN) 10 MG tablet Take 10-20 mg by mouth at bedtime.       STOP taking these medications     hydrochlorothiazide (HYDRODIURIL) 25 MG tablet        Allergies  Allergen Reactions  . Tetanus Toxoids Swelling  . Yellow Dyes (Non-Tartrazine) Itching    Makes patient nervous    Follow-up Information   Follow up with [facility], MD. Schedule an appointment as soon as possible for a visit in 1 week. (for follow up of stent placement)    Specialty:  Gastroenterology   Contact information:   [address], [place] [address] [phone]       Follow up with Horton Finer, MD. Schedule an appointment as soon as possible for a visit in 1 week. (hospital follow up)    Specialty:  Internal Medicine   Contact information:   [address], [place] [address] [phone]        The results of significant diagnostics from this hospitalization (including imaging, microbiology, ancillary and laboratory) are listed below for reference.    Significant Diagnostic Studies: Ct Abdomen Pelvis Wo Contrast  08/23/2014   CLINICAL DATA:  Anorexia. Generalized weakness. Symptoms worse over the  last 8 weeks. 15 lb weight loss. Frequent falls. Jaundice. No abdominal pain, nausea, vomiting.  EXAM: CT ABDOMEN AND PELVIS WITHOUT CONTRAST  TECHNIQUE: Multidetector CT imaging of the abdomen and pelvis was performed following the standard protocol without IV contrast.  COMPARISON:  CT of the abdomen and pelvis on 02/28/2012  FINDINGS: Lower chest: There is mild fibrosis at the lung bases which may be chronic. Atherosclerotic calcification is noted in the coronary vessels. There is a diaphragmatic node measuring 1.0 cm.  Upper abdomen: There is marked intrahepatic and  extrahepatic biliary ductal dilatation. The common bile duct measures as large as 2.6 cm. A distal common bile duct hyperdense mass is faintly seen and measures 1.4 cm. This may represent a gallstone or soft tissue mass. The patient has had previous cholecystectomy. Given the lack of intravenous contrast, it would be difficult to exclude a liver lesion but none are seen. A probable hyperdense cyst is identified involving the midpole region right kidney and measures 1.6 cm. The left kidney has a normal appearance. The spleen has a normal appearance. Adrenal glands are unremarkable in appearance. No focal abnormality identified within the pancreas.  Bowel: The stomach and small bowel loops are normal in appearance. There is moderate sigmoid diverticulosis. Significant stool is identified throughout the colon. The appendix is well seen and has a normal appearance.  Pelvis: The uterus is surgically absent. The urinary bladder is distended. Suspect right adnexal mass/cyst measuring 1.7 cm. Left adnexal region has a normal appearance. There is no free pelvic fluid.  Retroperitoneum: There is atherosclerotic calcification of the aorta. No retroperitoneal or mesenteric adenopathy.  Abdominal wall: Unremarkable.  Osseous structures: Significant hardware from prior spinal fusion. No suspicious lytic or blastic lesions.  IMPRESSION: 1. Marked biliary dilatation  to the level of the distal common bile duct. Faint hyperdense mass measuring 1.4 cm seen in the distal common bile duct. Considerations include a stone or tumor. Further evaluation is warranted. Consider MRI/MRCP if the patient is able to tolerate MRI. Otherwise, consider ultrasound of the abdomen. 2. Probable right renal cyst. 3. Sigmoid diverticulosis. 4. Suspect right adnexal mass/cyst measuring 1.7 cm. This has benign characteristics. No further evaluation is felt to be necessary. This recommendation follows ACR consensus guidelines: White Paper of the ACR Incidental Findings Committee II on Adnexal Findings. J Am Coll Radiol [id number].   Electronically Signed   By: Shon Hale M.D.   On: 08/23/2014 16:54   Ct Head Wo Contrast  08/23/2014   CLINICAL DATA:  78 year old female with fall, weakness, head and neck injury.  EXAM: CT HEAD WITHOUT CONTRAST  CT CERVICAL SPINE WITHOUT CONTRAST  TECHNIQUE: Multidetector CT imaging of the head and cervical spine was performed following the standard protocol without intravenous contrast. Multiplanar CT image reconstructions of the cervical spine were also generated.  COMPARISON:  03/28/2012 head CT  FINDINGS: CT HEAD FINDINGS  Minimal chronic small-vessel white matter ischemic changes again noted.  No acute intracranial abnormalities are identified, including mass lesion or mass effect, hydrocephalus, extra-axial fluid collection, midline shift, hemorrhage, or acute infarction. The visualized bony calvarium is unremarkable.  CT CERVICAL SPINE FINDINGS  There is no evidence of acute fracture or subluxation or prevertebral soft tissue swelling.  Multilevel degenerative disc disease, spondylosis and facet arthropathy identified with moderate degenerative disc disease from C5-C7.  The soft tissue structures are unremarkable.  IMPRESSION: No evidence of acute intracranial abnormality or static evidence of acute injury to the cervical spine.  Degenerative changes within  the cervical spine.   Electronically Signed   By: Hassan Rowan M.D.   On: 08/23/2014 16:47   Ct Cervical Spine Wo Contrast  08/23/2014   CLINICAL DATA:  78 year old female with fall, weakness, head and neck injury.  EXAM: CT HEAD WITHOUT CONTRAST  CT CERVICAL SPINE WITHOUT CONTRAST  TECHNIQUE: Multidetector CT imaging of the head and cervical spine was performed following the standard protocol without intravenous contrast. Multiplanar CT image reconstructions of the cervical spine were also generated.  COMPARISON:  03/28/2012 head CT  FINDINGS:  CT HEAD FINDINGS  Minimal chronic small-vessel white matter ischemic changes again noted.  No acute intracranial abnormalities are identified, including mass lesion or mass effect, hydrocephalus, extra-axial fluid collection, midline shift, hemorrhage, or acute infarction. The visualized bony calvarium is unremarkable.  CT CERVICAL SPINE FINDINGS  There is no evidence of acute fracture or subluxation or prevertebral soft tissue swelling.  Multilevel degenerative disc disease, spondylosis and facet arthropathy identified with moderate degenerative disc disease from C5-C7.  The soft tissue structures are unremarkable.  IMPRESSION: No evidence of acute intracranial abnormality or static evidence of acute injury to the cervical spine.  Degenerative changes within the cervical spine.   Electronically Signed   By: Hassan Rowan M.D.   On: 08/23/2014 16:47   US Abdomen Complete  08/23/2014   CLINICAL DATA:  78 year old female with jaundice. History of cholecystectomy.  EXAM: ULTRASOUND ABDOMEN COMPLETE  COMPARISON:  02/28/2012 CT  FINDINGS: Gallbladder:  Gallbladder is not visualized compatible with cholecystectomy.  Common bile duct:  Diameter: 18.6 mm. Intrahepatic biliary dilatation is identified. An obstructing cause is not identified on this study but the mid and distal CBD are not well visualized.  Liver:  No focal lesion identified. Within normal limits in parenchymal  echogenicity.  IVC:  No abnormality visualized.  Pancreas:  The head and tail of the pancreas are difficult to visualize. Pancreatic ductal dilatation is noted.  Spleen:  Size and appearance within normal limits.  Right Kidney:  Length: 12.1 cm. Echogenicity is upper limits of normal. No mass or hydronephrosis visualized. Multiple cysts are identified.  Left Kidney:  Length: 11.7 cm. Echogenicity is upper limits of normal. No mass or hydronephrosis visualized.  Abdominal aorta:  No aneurysm visualized.  Other findings:  None.  IMPRESSION: CBD and intrahepatic biliary dilatation and pancreatic ductal dilatation without identifiable obstructing cause on this study. The mid and distal CBD as well as the pancreatic head are not well visualized. Recommend further evaluation.  Upper limits of normal renal echogenicity which may be seen with medical renal disease.  Cholecystectomy.   Electronically Signed   By: Hassan Rowan M.D.   On: 08/23/2014 16:26   Mr 3d Recon At Scanner  08/26/2014   CLINICAL DATA:  Evaluate for biliary tract obstruction.  EXAM: MRI ABDOMEN WITHOUT AND WITH CONTRAST (INCLUDING MRCP)  TECHNIQUE: Multiplanar multisequence MR imaging of the abdomen was performed both before and after the administration of intravenous contrast. Heavily T2-weighted images of the biliary and pancreatic ducts were obtained, and three-dimensional MRCP images were rendered by post processing.  CONTRAST:  50mL MULTIHANCE GADOBENATE DIMEGLUMINE 529 MG/ML IV SOLN  COMPARISON:  No priors. CT of the abdomen and pelvis without contrast 08/23/2014.  FINDINGS: Patient is status post cholecystectomy. MRCP images demonstrates moderate intra and extrahepatic biliary ductal dilatation. Common bile duct measures up to 10 mm distally, before it abruptly tapers to approximately 1 mm immediately adjacent to the level of the ampulla. This narrowing spans a length of approximately 1.4 cm, and there is mild soft tissue prominence around this  narrowed area of the duct, which demonstrates very subtle differential enhancement compared to the remainder of the pancreatic parenchyma. While there is no definite pancreatic head mass, the possibility of a tiny ampullary lesion is difficult to entirely exclude; however, these findings are favored to reflect a distal ductal stricture. Additionally, the pancreatic duct is not dilated. On image 31 of series 5 there is a tiny filling defect in the distal common bile duct, however, this  is favored to be related to flow as there are no filling defects in this region on any of the other pulse sequences (including MRCP images) to suggest underlying ductal stone.  No suspicious hepatic lesion noted. There is slight heterogeneous signal intensity in a body and tail of the pancreas on T2 weighted images, with a small amount of fluid in the adjacent retroperitoneal regions, suggesting mild or resolving pancreatitis. This amorphous retroperitoneal fluid extends into the paracolic gutters bilaterally. No discrete peripancreatic fluid collections are noted at this time. The appearance of the spleen and bilateral adrenal glands is unremarkable. Multiple renal lesions are noted which are low T1 signal intensity, high T2 signal intensity, and do not enhance, compatible with simple cysts, largest of which measure up to 1.5 cm in diameter in the interpolar region of the right kidney.  Orthopedic fixation hardware in the visualized thoracolumbar spine resulting in artifact throughout this region. Small right pleural effusion layering dependently.  IMPRESSION: 1. The common bile duct is dilated to approximately 10 mm and there is moderate intrahepatic biliary ductal dilatation, indicative of biliary tract obstruction. This is associated with a long segment of narrowing over approximately 1.4 cm in the distal common bile duct immediately before the ampulla where the duct tapers to approximately 1 mm in diameter. In the region of ductal  narrowing there is some prominence of the surrounding soft tissues, which demonstrate very subtle differential enhancement. It is difficult to discern whether or not this is simply a stricture, or if there is a small circumferential ampullary lesion in this region. Correlation with endoscopic ultrasound, ERCP and potential biopsy is recommended. 2. The appearance of the pancreas suggest either mild or resolving pancreatitis. No discrete peripancreatic fluid collection to suggest pseudocyst at this time. 3. Status post cholecystectomy. 4. Additional incidental findings, as above.   Electronically Signed   By: Vinnie Langton M.D.   On: 08/26/2014 08:41   Dg Chest Port 1 View  08/23/2014   CLINICAL DATA:  SOB.  Weakness, jaundice, hypertension.  EXAM: PORTABLE CHEST - 1 VIEW  COMPARISON:  04/28/2007  FINDINGS: Shallow lung inflation. The heart is enlarged. There is perihilar peribronchial thickening. No focal consolidations or pleural effusions. No pulmonary edema. The patient has had previous left clavicle ORIF an left shoulder arthroplasty. Patient has had posterior thoracolumbar fusion. Surgical clips are noted in the right upper quadrant of the abdomen.  IMPRESSION: 1. Shallow inflation. 2. Bronchitic changes. 3.  No focal acute pulmonary abnormality.   Electronically Signed   By: Shon Hale M.D.   On: 08/23/2014 13:32   Dg Ercp Biliary & Pancreatic Ducts  08/26/2014   CLINICAL DATA:  Biliary obstruction.  EXAM: ERCP  TECHNIQUE: Multiple spot images obtained with the fluoroscopic device and submitted for interpretation post-procedure.  COMPARISON:  None.  FINDINGS: Images demonstrate cannulation of the common bile duct, contrast filling a dilated biliary tree, and placement of a stent across the ampulla of. Balloon stone extraction is also noted.  IMPRESSION: ERCP.  These images were submitted for radiologic interpretation only. Please see the procedural report for the amount of contrast and the  fluoroscopy time utilized.   Electronically Signed   By: Maryclare Bean M.D.   On: 08/26/2014 12:50   Mr Abd W/wo Cm/mrcp  08/26/2014   CLINICAL DATA:  Evaluate for biliary tract obstruction.  EXAM: MRI ABDOMEN WITHOUT AND WITH CONTRAST (INCLUDING MRCP)  TECHNIQUE: Multiplanar multisequence MR imaging of the abdomen was performed both before and after  the administration of intravenous contrast. Heavily T2-weighted images of the biliary and pancreatic ducts were obtained, and three-dimensional MRCP images were rendered by post processing.  CONTRAST:  49mL MULTIHANCE GADOBENATE DIMEGLUMINE 529 MG/ML IV SOLN  COMPARISON:  No priors. CT of the abdomen and pelvis without contrast 08/23/2014.  FINDINGS: Patient is status post cholecystectomy. MRCP images demonstrates moderate intra and extrahepatic biliary ductal dilatation. Common bile duct measures up to 10 mm distally, before it abruptly tapers to approximately 1 mm immediately adjacent to the level of the ampulla. This narrowing spans a length of approximately 1.4 cm, and there is mild soft tissue prominence around this narrowed area of the duct, which demonstrates very subtle differential enhancement compared to the remainder of the pancreatic parenchyma. While there is no definite pancreatic head mass, the possibility of a tiny ampullary lesion is difficult to entirely exclude; however, these findings are favored to reflect a distal ductal stricture. Additionally, the pancreatic duct is not dilated. On image 31 of series 5 there is a tiny filling defect in the distal common bile duct, however, this is favored to be related to flow as there are no filling defects in this region on any of the other pulse sequences (including MRCP images) to suggest underlying ductal stone.  No suspicious hepatic lesion noted. There is slight heterogeneous signal intensity in a body and tail of the pancreas on T2 weighted images, with a small amount of fluid in the adjacent  retroperitoneal regions, suggesting mild or resolving pancreatitis. This amorphous retroperitoneal fluid extends into the paracolic gutters bilaterally. No discrete peripancreatic fluid collections are noted at this time. The appearance of the spleen and bilateral adrenal glands is unremarkable. Multiple renal lesions are noted which are low T1 signal intensity, high T2 signal intensity, and do not enhance, compatible with simple cysts, largest of which measure up to 1.5 cm in diameter in the interpolar region of the right kidney.  Orthopedic fixation hardware in the visualized thoracolumbar spine resulting in artifact throughout this region. Small right pleural effusion layering dependently.  IMPRESSION: 1. The common bile duct is dilated to approximately 10 mm and there is moderate intrahepatic biliary ductal dilatation, indicative of biliary tract obstruction. This is associated with a long segment of narrowing over approximately 1.4 cm in the distal common bile duct immediately before the ampulla where the duct tapers to approximately 1 mm in diameter. In the region of ductal narrowing there is some prominence of the surrounding soft tissues, which demonstrate very subtle differential enhancement. It is difficult to discern whether or not this is simply a stricture, or if there is a small circumferential ampullary lesion in this region. Correlation with endoscopic ultrasound, ERCP and potential biopsy is recommended. 2. The appearance of the pancreas suggest either mild or resolving pancreatitis. No discrete peripancreatic fluid collection to suggest pseudocyst at this time. 3. Status post cholecystectomy. 4. Additional incidental findings, as above.   Electronically Signed   By: Vinnie Langton M.D.   On: 08/26/2014 08:41    Microbiology: Recent Results (from the past 240 hour(s))  CULTURE, BLOOD (ROUTINE X 2)     Status: None   Collection Time    08/23/14  8:21 PM      Result Value Ref Range Status    Specimen Description BLOOD RIGHT HAND   Final   Special Requests BOTTLES DRAWN AEROBIC ONLY 2CC   Final   Culture  Setup Time     Final   Value: 08/24/2014 02:06  Performed at [facility]     Final   Value:        BLOOD CULTURE RECEIVED NO GROWTH TO DATE CULTURE WILL BE HELD FOR 5 DAYS BEFORE ISSUING A FINAL NEGATIVE REPORT     Performed at [facility]   Report Status PENDING   Incomplete  CULTURE, BLOOD (ROUTINE X 2)     Status: None   Collection Time    08/23/14  8:26 PM      Result Value Ref Range Status   Specimen Description BLOOD LEFT HAND   Final   Special Requests BOTTLES DRAWN AEROBIC ONLY 2CC   Final   Culture  Setup Time     Final   Value: 08/24/2014 02:07     Performed at [facility]   Culture     Final   Value:        BLOOD CULTURE RECEIVED NO GROWTH TO DATE CULTURE WILL BE HELD FOR 5 DAYS BEFORE ISSUING A FINAL NEGATIVE REPORT     Performed at [facility]   Report Status PENDING   Incomplete  CLOSTRIDIUM DIFFICILE BY PCR     Status: Abnormal   Collection Time    08/24/14  2:51 AM      Result Value Ref Range Status   C difficile by pcr POSITIVE (*) NEGATIVE Final   Comment: CRITICAL RESULT CALLED TO, READ BACK BY AND VERIFIED WITH:     ZELLNER R.,RN 08/24/14 1231 BY JONESJ     Labs: Basic Metabolic Panel:  Recent Labs Lab 08/24/14 0620 08/25/14 0850 08/26/14 1343 08/27/14 0719 08/28/14 0505  NA 141 143 141 145 142  K 2.9* 3.3* 3.7 3.4* 3.6*  CL 108 111 108 111 109  CO2 16* 19 20 18* 20  GLUCOSE 89 94 113* 110* 118*  BUN 28* 19 12 8  5*  CREATININE 0.84 0.67 0.61 0.58 0.56  CALCIUM 8.0* 8.5 8.6 8.1* 7.9*   Liver Function Tests:  Recent Labs Lab 08/24/14 0620 08/25/14 0850 08/26/14 1343 08/27/14 0719 08/28/14 0505  AST 64* 37 28 28 29   ALT 47* 40* 35 31 29  ALKPHOS 367* 345* 364* 338* 361*  BILITOT 8.5* 9.6* 6.7* 5.4* 4.3*  PROT 5.5* 5.3* 5.6* 5.2* 5.2*  ALBUMIN 1.8* 1.7* 1.7* 1.7* 1.8*     Recent Labs Lab 08/23/14 1237 08/25/14 0850  LIPASE 810* 51   CBC:  Recent Labs Lab 08/23/14 1237 08/24/14 0620 08/25/14 0850 08/26/14 1343 08/27/14 0719  WBC 14.2* 13.6* 9.2 7.6 8.4  NEUTROABS 11.1*  --   --   --   --   HGB 10.9* 9.7* 9.4* 9.8* 9.1*  HCT 32.4* 29.1* 28.3* 29.6* 27.8*  MCV 88.5 88.2 88.2 89.4 91.4  PLT 197 199 214 260 277     Signed:  Keely Reichel, PA-S2 [facility] 08/28/2014, 11:36 AM  Attending Patient was seen, examined,treatment plan was discussed with the Physician extender. I have directly reviewed the clinical findings, lab, imaging studies and management of this patient in detail. I have made the necessary changes to the above noted documentation, and agree with the documentation, as recorded by the Physician extender.  Nena Alexander MD [facility].

## 2014-08-29 DIAGNOSIS — I10 Essential (primary) hypertension: Secondary | ICD-10-CM | POA: Diagnosis not present

## 2014-08-29 DIAGNOSIS — K219 Gastro-esophageal reflux disease without esophagitis: Secondary | ICD-10-CM | POA: Diagnosis not present

## 2014-08-29 DIAGNOSIS — K831 Obstruction of bile duct: Secondary | ICD-10-CM | POA: Diagnosis not present

## 2014-08-29 DIAGNOSIS — K835 Biliary cyst: Secondary | ICD-10-CM | POA: Diagnosis not present

## 2014-08-29 DIAGNOSIS — F419 Anxiety disorder, unspecified: Secondary | ICD-10-CM | POA: Diagnosis not present

## 2014-08-29 DIAGNOSIS — E43 Unspecified severe protein-calorie malnutrition: Secondary | ICD-10-CM | POA: Diagnosis not present

## 2014-08-30 ENCOUNTER — Encounter (HOSPITAL_COMMUNITY): Payer: Self-pay | Admitting: Pharmacy Technician

## 2014-08-30 LAB — CULTURE, BLOOD (ROUTINE X 2)
CULTURE: NO GROWTH
Culture: NO GROWTH

## 2014-09-02 ENCOUNTER — Encounter (HOSPITAL_COMMUNITY): Payer: Self-pay | Admitting: *Deleted

## 2014-09-03 DIAGNOSIS — F419 Anxiety disorder, unspecified: Secondary | ICD-10-CM | POA: Diagnosis not present

## 2014-09-03 DIAGNOSIS — K835 Biliary cyst: Secondary | ICD-10-CM | POA: Diagnosis not present

## 2014-09-03 DIAGNOSIS — K219 Gastro-esophageal reflux disease without esophagitis: Secondary | ICD-10-CM | POA: Diagnosis not present

## 2014-09-03 DIAGNOSIS — I10 Essential (primary) hypertension: Secondary | ICD-10-CM | POA: Diagnosis not present

## 2014-09-03 DIAGNOSIS — K831 Obstruction of bile duct: Secondary | ICD-10-CM | POA: Diagnosis not present

## 2014-09-03 DIAGNOSIS — E43 Unspecified severe protein-calorie malnutrition: Secondary | ICD-10-CM | POA: Diagnosis not present

## 2014-09-04 ENCOUNTER — Emergency Department (HOSPITAL_COMMUNITY): Payer: Medicare Other

## 2014-09-04 ENCOUNTER — Inpatient Hospital Stay (HOSPITAL_COMMUNITY)
Admission: EM | Admit: 2014-09-04 | Discharge: 2014-09-08 | DRG: 919 | Disposition: A | Discharge status: Home / Self Care | Payer: Medicare Other | Attending: Internal Medicine | Admitting: Internal Medicine

## 2014-09-04 ENCOUNTER — Encounter (HOSPITAL_COMMUNITY): Payer: Self-pay | Admitting: Emergency Medicine

## 2014-09-04 DIAGNOSIS — K8309 Other cholangitis: Secondary | ICD-10-CM

## 2014-09-04 DIAGNOSIS — K567 Ileus, unspecified: Secondary | ICD-10-CM

## 2014-09-04 DIAGNOSIS — R7402 Elevation of levels of lactic acid dehydrogenase (LDH): Secondary | ICD-10-CM | POA: Diagnosis not present

## 2014-09-04 DIAGNOSIS — T85898A Other specified complication of other internal prosthetic devices, implants and grafts, initial encounter: Secondary | ICD-10-CM

## 2014-09-04 DIAGNOSIS — R112 Nausea with vomiting, unspecified: Secondary | ICD-10-CM

## 2014-09-04 DIAGNOSIS — F419 Anxiety disorder, unspecified: Secondary | ICD-10-CM

## 2014-09-04 DIAGNOSIS — Y831 Surgical operation with implant of artificial internal device as the cause of abnormal reaction of the patient, or of later complication, without mention of misadventure at the time of the procedure: Secondary | ICD-10-CM | POA: Diagnosis present

## 2014-09-04 DIAGNOSIS — E876 Hypokalemia: Secondary | ICD-10-CM

## 2014-09-04 DIAGNOSIS — T85520A Displacement of bile duct prosthesis, initial encounter: Secondary | ICD-10-CM

## 2014-09-04 DIAGNOSIS — R1110 Vomiting, unspecified: Secondary | ICD-10-CM | POA: Diagnosis present

## 2014-09-04 DIAGNOSIS — R4181 Age-related cognitive decline: Secondary | ICD-10-CM | POA: Diagnosis present

## 2014-09-04 DIAGNOSIS — J811 Chronic pulmonary edema: Secondary | ICD-10-CM | POA: Diagnosis present

## 2014-09-04 DIAGNOSIS — A0472 Enterocolitis due to Clostridium difficile, not specified as recurrent: Secondary | ICD-10-CM | POA: Diagnosis present

## 2014-09-04 DIAGNOSIS — Z801 Family history of malignant neoplasm of trachea, bronchus and lung: Secondary | ICD-10-CM

## 2014-09-04 DIAGNOSIS — Z9089 Acquired absence of other organs: Secondary | ICD-10-CM | POA: Diagnosis not present

## 2014-09-04 DIAGNOSIS — F411 Generalized anxiety disorder: Secondary | ICD-10-CM | POA: Diagnosis present

## 2014-09-04 DIAGNOSIS — K633 Ulcer of intestine: Secondary | ICD-10-CM | POA: Diagnosis present

## 2014-09-04 DIAGNOSIS — Z79899 Other long term (current) drug therapy: Secondary | ICD-10-CM | POA: Diagnosis not present

## 2014-09-04 DIAGNOSIS — N179 Acute kidney failure, unspecified: Secondary | ICD-10-CM | POA: Diagnosis not present

## 2014-09-04 DIAGNOSIS — I10 Essential (primary) hypertension: Secondary | ICD-10-CM | POA: Diagnosis present

## 2014-09-04 DIAGNOSIS — T184XXA Foreign body in colon, initial encounter: Secondary | ICD-10-CM | POA: Diagnosis not present

## 2014-09-04 DIAGNOSIS — K831 Obstruction of bile duct: Secondary | ICD-10-CM | POA: Diagnosis present

## 2014-09-04 DIAGNOSIS — K6389 Other specified diseases of intestine: Secondary | ICD-10-CM | POA: Diagnosis not present

## 2014-09-04 DIAGNOSIS — IMO0002 Reserved for concepts with insufficient information to code with codable children: Secondary | ICD-10-CM | POA: Diagnosis not present

## 2014-09-04 DIAGNOSIS — K219 Gastro-esophageal reflux disease without esophagitis: Secondary | ICD-10-CM | POA: Diagnosis present

## 2014-09-04 DIAGNOSIS — K5730 Diverticulosis of large intestine without perforation or abscess without bleeding: Secondary | ICD-10-CM | POA: Diagnosis not present

## 2014-09-04 DIAGNOSIS — E039 Hypothyroidism, unspecified: Secondary | ICD-10-CM | POA: Diagnosis present

## 2014-09-04 DIAGNOSIS — L4050 Arthropathic psoriasis, unspecified: Secondary | ICD-10-CM | POA: Diagnosis present

## 2014-09-04 DIAGNOSIS — E43 Unspecified severe protein-calorie malnutrition: Secondary | ICD-10-CM

## 2014-09-04 DIAGNOSIS — Z887 Allergy status to serum and vaccine status: Secondary | ICD-10-CM

## 2014-09-04 DIAGNOSIS — Z96619 Presence of unspecified artificial shoulder joint: Secondary | ICD-10-CM | POA: Diagnosis not present

## 2014-09-04 DIAGNOSIS — T85698A Other mechanical complication of other specified internal prosthetic devices, implants and grafts, initial encounter: Principal | ICD-10-CM | POA: Diagnosis present

## 2014-09-04 DIAGNOSIS — D689 Coagulation defect, unspecified: Secondary | ICD-10-CM

## 2014-09-04 DIAGNOSIS — R1011 Right upper quadrant pain: Secondary | ICD-10-CM

## 2014-09-04 DIAGNOSIS — I059 Rheumatic mitral valve disease, unspecified: Secondary | ICD-10-CM | POA: Diagnosis not present

## 2014-09-04 DIAGNOSIS — Z8249 Family history of ischemic heart disease and other diseases of the circulatory system: Secondary | ICD-10-CM | POA: Diagnosis not present

## 2014-09-04 DIAGNOSIS — K5900 Constipation, unspecified: Secondary | ICD-10-CM | POA: Diagnosis not present

## 2014-09-04 DIAGNOSIS — E785 Hyperlipidemia, unspecified: Secondary | ICD-10-CM | POA: Diagnosis present

## 2014-09-04 DIAGNOSIS — D72829 Elevated white blood cell count, unspecified: Secondary | ICD-10-CM | POA: Diagnosis not present

## 2014-09-04 DIAGNOSIS — K859 Acute pancreatitis without necrosis or infection, unspecified: Secondary | ICD-10-CM | POA: Diagnosis not present

## 2014-09-04 DIAGNOSIS — R17 Unspecified jaundice: Secondary | ICD-10-CM | POA: Diagnosis not present

## 2014-09-04 DIAGNOSIS — K589 Irritable bowel syndrome without diarrhea: Secondary | ICD-10-CM | POA: Diagnosis present

## 2014-09-04 DIAGNOSIS — R933 Abnormal findings on diagnostic imaging of other parts of digestive tract: Secondary | ICD-10-CM | POA: Diagnosis not present

## 2014-09-04 DIAGNOSIS — T183XXA Foreign body in small intestine, initial encounter: Secondary | ICD-10-CM | POA: Diagnosis not present

## 2014-09-04 DIAGNOSIS — K560 Paralytic ileus: Secondary | ICD-10-CM | POA: Diagnosis not present

## 2014-09-04 DIAGNOSIS — E46 Unspecified protein-calorie malnutrition: Secondary | ICD-10-CM | POA: Diagnosis present

## 2014-09-04 DIAGNOSIS — R109 Unspecified abdominal pain: Secondary | ICD-10-CM | POA: Diagnosis not present

## 2014-09-04 DIAGNOSIS — M1990 Unspecified osteoarthritis, unspecified site: Secondary | ICD-10-CM

## 2014-09-04 DIAGNOSIS — K458 Other specified abdominal hernia without obstruction or gangrene: Secondary | ICD-10-CM

## 2014-09-04 HISTORY — DX: Acute pancreatitis without necrosis or infection, unspecified: K85.90

## 2014-09-04 LAB — PROTIME-INR
INR: 1.02 (ref 0.00–1.49)
Prothrombin Time: 13.4 seconds (ref 11.6–15.2)

## 2014-09-04 LAB — COMPREHENSIVE METABOLIC PANEL
ALK PHOS: 292 U/L — AB (ref 39–117)
ALT: 21 U/L (ref 0–35)
ANION GAP: 17 — AB (ref 5–15)
AST: 33 U/L (ref 0–37)
Albumin: 3.5 g/dL (ref 3.5–5.2)
BUN: 19 mg/dL (ref 6–23)
CHLORIDE: 101 meq/L (ref 96–112)
CO2: 23 mEq/L (ref 19–32)
CREATININE: 0.83 mg/dL (ref 0.50–1.10)
Calcium: 9.9 mg/dL (ref 8.4–10.5)
GFR, EST AFRICAN AMERICAN: 78 mL/min — AB (ref >90)
GFR, EST NON AFRICAN AMERICAN: 67 mL/min — AB (ref >90)
GLUCOSE: 186 mg/dL — AB (ref 70–99)
POTASSIUM: 4.1 meq/L (ref 3.7–5.3)
Sodium: 141 mEq/L (ref 137–147)
Total Bilirubin: 2.7 mg/dL — ABNORMAL HIGH (ref 0.3–1.2)
Total Protein: 8.1 g/dL (ref 6.0–8.3)

## 2014-09-04 LAB — CBC WITH DIFFERENTIAL/PLATELET
Basophils Absolute: 0 10*3/uL (ref 0.0–0.1)
Basophils Relative: 0 % (ref 0–1)
Eosinophils Absolute: 0.1 10*3/uL (ref 0.0–0.7)
Eosinophils Relative: 0 % (ref 0–5)
HCT: 41.3 % (ref 36.0–46.0)
HEMOGLOBIN: 13.3 g/dL (ref 12.0–15.0)
LYMPHS ABS: 1.5 10*3/uL (ref 0.7–4.0)
Lymphocytes Relative: 10 % — ABNORMAL LOW (ref 12–46)
MCH: 29.9 pg (ref 26.0–34.0)
MCHC: 32.2 g/dL (ref 30.0–36.0)
MCV: 92.8 fL (ref 78.0–100.0)
Monocytes Absolute: 0.6 10*3/uL (ref 0.1–1.0)
Monocytes Relative: 4 % (ref 3–12)
NEUTROS ABS: 12.4 10*3/uL — AB (ref 1.7–7.7)
NEUTROS PCT: 86 % — AB (ref 43–77)
Platelets: 446 10*3/uL — ABNORMAL HIGH (ref 150–400)
RBC: 4.45 MIL/uL (ref 3.87–5.11)
RDW: 15.2 % (ref 11.5–15.5)
WBC: 14.6 10*3/uL — ABNORMAL HIGH (ref 4.0–10.5)

## 2014-09-04 LAB — URINALYSIS, ROUTINE W REFLEX MICROSCOPIC
BILIRUBIN URINE: NEGATIVE
Glucose, UA: NEGATIVE mg/dL
Hgb urine dipstick: NEGATIVE
KETONES UR: NEGATIVE mg/dL
Leukocytes, UA: NEGATIVE
Nitrite: NEGATIVE
PROTEIN: NEGATIVE mg/dL
Specific Gravity, Urine: 1.012 (ref 1.005–1.030)
Urobilinogen, UA: 0.2 mg/dL (ref 0.0–1.0)
pH: 7 (ref 5.0–8.0)

## 2014-09-04 LAB — LIPASE, BLOOD: Lipase: 44 U/L (ref 11–59)

## 2014-09-04 LAB — TROPONIN I: Troponin I: <0.3 ng/mL (ref <0.30)

## 2014-09-04 MED ORDER — HYDROMORPHONE HCL 1 MG/ML IJ SOLN
1.0000 mg | INTRAMUSCULAR | Status: DC | PRN
  Administered 2014-09-04 – 2014-09-05 (×3): 1 mg via INTRAVENOUS
  Filled 2014-09-04 (×3): qty 1

## 2014-09-04 MED ORDER — FUROSEMIDE 10 MG/ML IJ SOLN
20.0000 mg | Freq: Two times a day (BID) | INTRAMUSCULAR | Status: DC
  Administered 2014-09-04 – 2014-09-05 (×2): 20 mg via INTRAVENOUS
  Filled 2014-09-04 (×3): qty 2

## 2014-09-04 MED ORDER — GABAPENTIN 400 MG PO CAPS
400.0000 mg | ORAL_CAPSULE | Freq: Four times a day (QID) | ORAL | Status: DC | PRN
  Filled 2014-09-04: qty 1

## 2014-09-04 MED ORDER — ONDANSETRON HCL 4 MG PO TABS
4.0000 mg | ORAL_TABLET | Freq: Four times a day (QID) | ORAL | Status: DC | PRN
  Administered 2014-09-04: 4 mg via ORAL
  Filled 2014-09-04: qty 1

## 2014-09-04 MED ORDER — POTASSIUM CHLORIDE CRYS ER 20 MEQ PO TBCR
20.0000 meq | EXTENDED_RELEASE_TABLET | Freq: Two times a day (BID) | ORAL | Status: DC
  Administered 2014-09-04 – 2014-09-08 (×8): 20 meq via ORAL
  Filled 2014-09-04 (×12): qty 1

## 2014-09-04 MED ORDER — ZOLPIDEM TARTRATE 5 MG PO TABS
5.0000 mg | ORAL_TABLET | Freq: Every evening | ORAL | Status: DC | PRN
  Administered 2014-09-04 – 2014-09-07 (×4): 5 mg via ORAL
  Filled 2014-09-04 (×4): qty 1

## 2014-09-04 MED ORDER — ONDANSETRON HCL 4 MG/2ML IJ SOLN
4.0000 mg | Freq: Three times a day (TID) | INTRAMUSCULAR | Status: DC | PRN
Start: 2014-09-04 — End: 2014-09-04
  Administered 2014-09-04: 4 mg via INTRAVENOUS
  Filled 2014-09-04 (×2): qty 2

## 2014-09-04 MED ORDER — ENOXAPARIN SODIUM 40 MG/0.4ML ~~LOC~~ SOLN
40.0000 mg | SUBCUTANEOUS | Status: DC
  Administered 2014-09-04 – 2014-09-07 (×4): 40 mg via SUBCUTANEOUS
  Filled 2014-09-04 (×5): qty 0.4

## 2014-09-04 MED ORDER — ONDANSETRON HCL 4 MG/2ML IJ SOLN
4.0000 mg | Freq: Four times a day (QID) | INTRAMUSCULAR | Status: DC | PRN
  Administered 2014-09-05 – 2014-09-07 (×4): 4 mg via INTRAVENOUS
  Filled 2014-09-04 (×5): qty 2

## 2014-09-04 MED ORDER — AMLODIPINE BESYLATE 10 MG PO TABS
10.0000 mg | ORAL_TABLET | Freq: Every morning | ORAL | Status: DC
Start: 2014-09-05 — End: 2014-09-08
  Administered 2014-09-05 – 2014-09-08 (×4): 10 mg via ORAL
  Filled 2014-09-04 (×4): qty 1

## 2014-09-04 MED ORDER — ACETAMINOPHEN 650 MG RE SUPP: 650.0000 mg | Freq: Four times a day (QID) | RECTAL | Status: DC | PRN

## 2014-09-04 MED ORDER — PANTOPRAZOLE SODIUM 40 MG PO TBEC
40.0000 mg | DELAYED_RELEASE_TABLET | Freq: Every day | ORAL | Status: DC
  Administered 2014-09-05 – 2014-09-06 (×2): 40 mg via ORAL
  Filled 2014-09-04: qty 1

## 2014-09-04 MED ORDER — FENOFIBRATE 160 MG PO TABS
160.0000 mg | ORAL_TABLET | Freq: Every day | ORAL | Status: DC
  Administered 2014-09-05 – 2014-09-08 (×4): 160 mg via ORAL
  Filled 2014-09-04 (×4): qty 1

## 2014-09-04 MED ORDER — HYDROMORPHONE HCL 1 MG/ML IJ SOLN: 0.5000 mg | INTRAMUSCULAR | Status: DC | PRN

## 2014-09-04 MED ORDER — ADULT MULTIVITAMIN W/MINERALS CH
1.0000 | ORAL_TABLET | Freq: Every day | ORAL | Status: DC
  Administered 2014-09-05 – 2014-09-08 (×4): 1 via ORAL
  Filled 2014-09-04 (×4): qty 1

## 2014-09-04 MED ORDER — ACETAMINOPHEN 325 MG PO TABS: 650.0000 mg | ORAL_TABLET | Freq: Four times a day (QID) | ORAL | Status: DC | PRN

## 2014-09-04 MED ORDER — LEVOTHYROXINE SODIUM 100 MCG PO TABS
100.0000 ug | ORAL_TABLET | Freq: Every day | ORAL | Status: DC
  Administered 2014-09-05 – 2014-09-08 (×4): 100 ug via ORAL
  Filled 2014-09-04 (×5): qty 1

## 2014-09-04 MED ORDER — ALPRAZOLAM 0.5 MG PO TABS
1.0000 mg | ORAL_TABLET | Freq: Four times a day (QID) | ORAL | Status: DC | PRN
  Administered 2014-09-04 – 2014-09-07 (×7): 1 mg via ORAL
  Filled 2014-09-04 (×7): qty 2

## 2014-09-04 MED ORDER — ATENOLOL 50 MG PO TABS
50.0000 mg | ORAL_TABLET | Freq: Every morning | ORAL | Status: DC
  Administered 2014-09-05 – 2014-09-08 (×4): 50 mg via ORAL
  Filled 2014-09-04 (×4): qty 1

## 2014-09-04 MED ORDER — METRONIDAZOLE 500 MG PO TABS
500.0000 mg | ORAL_TABLET | Freq: Three times a day (TID) | ORAL | Status: DC
  Administered 2014-09-04 – 2014-09-08 (×11): 500 mg via ORAL
  Filled 2014-09-04 (×14): qty 1

## 2014-09-04 MED ORDER — SODIUM CHLORIDE 0.9 % IV SOLN
INTRAVENOUS | Status: DC
  Administered 2014-09-04: 14:00:00 via INTRAVENOUS

## 2014-09-04 NOTE — ED Notes (Signed)
Vomiting since yesterday has bile stent, is to have removed next wed

## 2014-09-04 NOTE — ED Notes (Signed)
IV Team at Bedside. 

## 2014-09-04 NOTE — H&P (Signed)
[facility] History and Physical  TENEE WISH DUK:[id number] DOB: 1936/03/25 DOA: 09/04/2014  Referring physician: EDP PCP: Horton Finer, MD   Chief Complaint: nausea and vomiting  HPI: TANAYIA WAHLQUIST is a 78 y.o. female with PMH of hypertension, irritable bowel syndrome, hypothyroidism, GERD was just discharged from [facility] 9/16 after admission for obstructive jaundice due to biliary stricture, she had MRCP, and ERCP with temporary stent biliary placed 9/14 per Dr.Magod, she did well after this her LFTs started trending down and was discharged home 9/16. In addition she was also diagnosed with Cdiff colitis last admission and was sent home on PO Flagyl to complete a 14 day course starting 9/14. Yesterday she developed R sided abd pain with nausea and vomiting. In addition, also noted some chills, no fever, no diarrhea at this time. This morning too was having incraesing pain and another episode of vomiting and came to the ER. In ER, Labs improving bili and ALP, leukocytosis, KUB with migration of biliary stent, borderline dilated small bowel loops and some interstitial pulm edema. EDP called and d/w Dr.Magod and [facility] was consulted for admission   Review of Systems:  Constitutional:  No weight loss, night sweats, Fevers, chills, fatigue.  HEENT:  No headaches, Difficulty swallowing,Tooth/dental problems,Sore throat,  No sneezing, itching, ear ache, nasal congestion, post nasal drip,  Cardio-vascular:  No chest pain, Orthopnea, PND, swelling in lower extremities, anasarca, dizziness, palpitations  GI:  No heartburn, indigestion, abdominal pain, nausea, vomiting, diarrhea, change in bowel habits, loss of appetite  Resp:  No shortness of breath with exertion or at rest. No excess mucus, no productive cough, No non-productive cough, No coughing up of blood.No change in color of mucus.No wheezing.No chest wall deformity  Skin:  no rash or lesions.  GU:  no dysuria, change in  color of urine, no urgency or frequency. No flank pain.  Musculoskeletal:  No joint pain or swelling. No decreased range of motion. No back pain.  Psych:  No change in mood or affect. No depression or anxiety. No memory loss.   Past Medical History  Diagnosis Date  . Hypertension   . Hyperlipidemia   . Thyroid disease   . GERD (gastroesophageal reflux disease)   . Anxiety   . IBS (irritable bowel syndrome)   . Back pain   . Blood transfusion     at age of 4  . Hypothyroidism   . H/O hiatal hernia   . Anemia     hx of years ago   . Arthritis     psoriatic arthritis  . ARF (acute renal failure) 08/23/2014  . Pancreatitis    Past Surgical History  Procedure Laterality Date  . Ventral hernia repair  06/08/2012    Procedure: LAPAROSCOPIC VENTRAL HERNIA;  Surgeon: Adin Hector, MD;  Location: [facility];  Service: General;  Laterality: Right;  . Back surgery  2010    thoractic-screws and rods  . Joint replacement  2005    left shoulder replacement   . Cholecystectomy  1988    lap  . Abdominal hysterectomy      partial  . Colonoscopy    . Ercp    . Orif clavicular fracture Left 07/18/2014    Procedure: OPEN REDUCTION INTERNAL FIXATION (ORIF) LEFT CLAVICULAR FRACTURE;  Surgeon: Ninetta Lights, MD;  Location: [place];  Service: Orthopedics;  Laterality: Left;  . Ercp N/A 08/26/2014    Procedure: ENDOSCOPIC RETROGRADE CHOLANGIOPANCREATOGRAPHY (ERCP);  Surgeon: Jeryl Columbia,  MD;  Location: [place] [facility];  Service: Endoscopy;  Laterality: N/A;  . Tonsillectomy  as child   Social History:  reports that she has never smoked. She has never used smokeless tobacco. She reports that she does not drink alcohol or use illicit drugs.  Allergies  Allergen Reactions  . Tetanus Toxoids Swelling  . Yellow Dyes (Non-Tartrazine) Itching and Other (See Comments)    Makes patient nervous     Family History  Problem Relation Age of Onset  . Heart disease Mother   . Cancer  Father     lung     Prior to Admission medications   Medication Sig Start Date End Date Taking? Authorizing Provider  acetaminophen (TYLENOL) 500 MG tablet Take 1,000 mg by mouth once as needed for moderate pain.   Yes Historical Provider, MD  ALPRAZolam Duanne Moron) 1 MG tablet Take 1 mg by mouth 4 (four) times daily as needed for anxiety.    Yes Historical Provider, MD  amLODipine (NORVASC) 10 MG tablet Take 10 mg by mouth every morning.   Yes Historical Provider, MD  atenolol (TENORMIN) 50 MG tablet Take 50 mg by mouth every morning.   Yes Historical Provider, MD  desvenlafaxine (PRISTIQ) 50 MG 24 hr tablet Take 50 mg by mouth every morning.    Yes Historical Provider, MD  Dietary Management Product (ENLYTE) CAPS Take 1 tablet by mouth daily.   Yes Historical Provider, MD  esomeprazole (NEXIUM) 40 MG capsule Take 40 mg by mouth daily before breakfast.   Yes Historical Provider, MD  fenofibrate (TRICOR) 145 MG tablet Take 145 mg by mouth daily with breakfast.    Yes Historical Provider, MD  furosemide (LASIX) 20 MG tablet Take 20 mg by mouth daily with breakfast.    Yes Historical Provider, MD  gabapentin (NEURONTIN) 400 MG capsule Take 400 mg by mouth 4 (four) times daily as needed (for pain).    Yes Historical Provider, MD  hydrochlorothiazide (HYDRODIURIL) 25 MG tablet Take 25 mg by mouth daily. 08/29/14  Yes Historical Provider, MD  hyoscyamine (LEVBID) 0.375 MG 12 hr tablet Take 0.375 mg by mouth every 12 (twelve) hours as needed for cramping.    Yes Historical Provider, MD  levothyroxine (SYNTHROID, LEVOTHROID) 100 MCG tablet Take 100 mcg by mouth daily before breakfast.   Yes Historical Provider, MD  metroNIDAZOLE (FLAGYL) 500 MG tablet Take 1 tablet (500 mg total) by mouth every 8 (eight) hours. 08/28/14  Yes Bobby Rumpf York, PA-C  Multiple Vitamin (MULTIVITAMIN WITH MINERALS) TABS tablet Take 1 tablet by mouth daily.   Yes Historical Provider, MD  ondansetron (ZOFRAN) 4 MG tablet Take 1  tablet (4 mg total) by mouth every 8 (eight) hours as needed for nausea or vomiting. 07/18/14  Yes M. Doran Stabler, PA-C  potassium chloride SA (K-DUR,KLOR-CON) 20 MEQ tablet Take 20 mEq by mouth 2 (two) times daily.    Yes Historical Provider, MD  tiZANidine (ZANAFLEX) 4 MG tablet Take 4 mg by mouth every 8 (eight) hours as needed for muscle spasms.   Yes Historical Provider, MD  traMADol (ULTRAM) 50 MG tablet Take 50 mg by mouth every 6 (six) hours as needed for moderate pain.    Yes Historical Provider, MD  zolpidem (AMBIEN) 10 MG tablet Take 10 mg by mouth at bedtime.    Yes Historical Provider, MD   Physical Exam: Filed Vitals:   09/04/14 1422 09/04/14 1430 09/04/14 1500 09/04/14 1600  BP: 154/58 152/60 148/59 152/62  Pulse: 95  96 96 96  Temp:      TempSrc:      Resp: 27 30 22 18   SpO2: 96% 94% 94% 95%    Wt Readings from Last 3 Encounters:  08/23/14 63.05 kg (139 lb)  08/23/14 63.05 kg (139 lb)  08/23/14 63.05 kg (139 lb)    General:  Frail, elderly female, no distress Eyes: PERRL, normal lids, irises & conjunctiva ENT: grossly normal lips & tongue Neck: no LAD, masses or thyromegaly Cardiovascular: RRR, no m/r/g. No LE edema. Telemetry: SR, no arrhythmias  Respiratory: CTA bilaterally, no w/r/r. Normal respiratory effort. Abdomen: soft, NT, ND, BS diminished Skin: no rash or induration seen on limited exam Musculoskeletal: grossly normal tone BUE/BLE Psychiatric: grossly normal mood and affect, speech fluent and appropriate Neurologic: grossly non-focal.          Labs on Admission:  Basic Metabolic Panel:  Recent Labs Lab 09/04/14 1037  NA 141  K 4.1  CL 101  CO2 23  GLUCOSE 186*  BUN 19  CREATININE 0.83  CALCIUM 9.9   Liver Function Tests:  Recent Labs Lab 09/04/14 1037  AST 33  ALT 21  ALKPHOS 292*  BILITOT 2.7*  PROT 8.1  ALBUMIN 3.5    Recent Labs Lab 09/04/14 1426  LIPASE 44   No results found for this basename: AMMONIA,  in the last  168 hours CBC:  Recent Labs Lab 09/04/14 1037  WBC 14.6*  NEUTROABS 12.4*  HGB 13.3  HCT 41.3  MCV 92.8  PLT 446*   Cardiac Enzymes:  Recent Labs Lab 09/04/14 1426  TROPONINI <0.30    BNP (last 3 results) No results found for this basename: PROBNP,  in the last 8760 hours CBG: No results found for this basename: GLUCAP,  in the last 168 hours  Radiological Exams on Admission: US Abdomen Limited  09/04/2014   CLINICAL DATA:  Right upper quadrant pain. History of cholecystectomy and common bile duct stent.  EXAM: US ABDOMEN LIMITED - RIGHT UPPER QUADRANT  COMPARISON:  ERCP 08/26/2014 and MRCP 08/25/2014. Ultrasound abdomen 08/23/2014.  FINDINGS: Gallbladder:  Surgically absent.  Common bile duct:  Diameter: Measures up to 12 mm (previously 10 mm on 08/25/2014).  Liver:  No focal lesion identified. Within normal limits in parenchymal echogenicity.  IMPRESSION: Common bile duct is stable to minimally more prominent than on 08/25/2014. Patient is status post cholecystectomy.   Electronically Signed   By: Lorin Picket M.D.   On: 09/04/2014 14:49   Dg Abd Acute W/chest  09/04/2014   CLINICAL DATA:  78 year old female with abdominal pain and vomiting. Recent bowel obstruction  EXAM: ACUTE ABDOMEN SERIES (ABDOMEN 2 VIEW & CHEST 1 VIEW)  COMPARISON:  CT abdomen pelvis 08/23/2014, chest x-ray 08/23/2014 new  FINDINGS: Chest:  Low lung volumes persist, which accentuates the interstitium. Interlobular septal thickening, with some indistinctness around the central pulmonary vasculature. No pneumothorax or pleural effusion. No confluent airspace disease.  Surgical changes of prior reverse left shoulder hemi arthroplasty, as well as plate and screw fixation of prior left clavicle fracture.  Surgical changes of prior cholecystectomy and spinal fixation.  Abdomen:  Multiple borderline dilated small bowel loops. There is a relative paucity of colonic gas. Formed stool within the rectum.  Left  decubitus image demonstrates no evidence of layering free air.  The biliary stent projects over the right colon, having migrated from its previous placement on 08/26/2014.  No displaced fracture.  IMPRESSION: Evidence of developing interstitial pulmonary edema.  No evidence of free air, however, multiple borderline dilated small bowel loops suggest ileus and/or developing small bowel obstruction. Further imaging is recommended, such as serial plain film until resolution of the bowel gas pattern, or alternatively CT to evaluate for acute abnormality.  Migration of previous biliary stent, potentially within the small bowel or right colon.  Previous surgical changes, as above.  Signed,  Dulcy Fanny. Earleen Newport, DO  [facility]  [facility]   Electronically Signed   By: Corrie Mckusick O.D.   On: 09/04/2014 15:53    Assessment/Plan   Nausea and Vomiting/Biliary stricture with migrated stent -clinically no evidence of cholangitis at this time, no fevers, bili improving -Bile duct brushings with Atypical cells, no malignancy noted on Path -KuB in am to re-eval position of stent -CMet in am -Dr.Magod consulted, will FU in am -Clears for now, NPO after MN   Mild Interstitial Pulm edema -asymptomatic -gentle lasix for now, takes PO lasix and HCTZ at home -check ECHO -Follow I/Os, weights    Hypertension -continue home regimen, except HCTZ    GERD (gastroesophageal reflux disease) -continue PPI     Anxiety -continue anxiolytics PRN    Protein-calorie malnutrition, severe -to be addressed when #1 improves    Clostridium difficile colitis -no diarrhea at this time -complete Flagyl regimen, last day 9/28, based on DC summary  Code Status: Full DVT Prophylaxis: lovenox Family Communication: d/w spouse and daughter at bedside Disposition Plan: home when improved  Time spent: 53min  Graziella Connery [facility] Pager [phone]

## 2014-09-04 NOTE — ED Provider Notes (Signed)
CSN: [id number]     Arrival date & time 09/04/14  8657 History   First MD Initiated Contact with Patient 09/04/14 1151     Chief Complaint  Patient presents with  . Emesis     HPI Pt was seen at 1155. Per pt, c/o gradual onset and persistence of multiple intermittent episodes of N/V that began yesterday. Has been associated with RUQ abd "pain." Describes the vomiting as "forceful" and "projectile." Pt states she was admitted 2 weeks ago for jaundice and biliary duct dilatation, and was tx with biliary stent. Denies diarrhea, no CP/SOB, no back pain, no fevers, no black or blood in stools or emesis.     Past Medical History  Diagnosis Date  . Hypertension   . Hyperlipidemia   . Thyroid disease   . GERD (gastroesophageal reflux disease)   . Anxiety   . IBS (irritable bowel syndrome)   . Back pain   . Blood transfusion     at age of 78  . Hypothyroidism   . H/O hiatal hernia   . Anemia     hx of years ago   . Arthritis     psoriatic arthritis  . ARF (acute renal failure) 08/23/2014  . Pancreatitis    Past Surgical History  Procedure Laterality Date  . Ventral hernia repair  06/08/2012    Procedure: LAPAROSCOPIC VENTRAL HERNIA;  Surgeon: Adin Hector, MD;  Location: [facility];  Service: General;  Laterality: Right;  . Back surgery  2010    thoractic-screws and rods  . Joint replacement  2005    left shoulder replacement   . Cholecystectomy  1988    lap  . Abdominal hysterectomy      partial  . Colonoscopy    . Ercp    . Orif clavicular fracture Left 07/18/2014    Procedure: OPEN REDUCTION INTERNAL FIXATION (ORIF) LEFT CLAVICULAR FRACTURE;  Surgeon: Ninetta Lights, MD;  Location: [facility];  Service: Orthopedics;  Laterality: Left;  . Ercp N/A 08/26/2014    Procedure: ENDOSCOPIC RETROGRADE CHOLANGIOPANCREATOGRAPHY (ERCP);  Surgeon: Jeryl Columbia, MD;  Location: [facility] ENDOSCOPY;  Service: Endoscopy;  Laterality: N/A;  . Tonsillectomy  as child   Family History   Problem Relation Age of Onset  . Heart disease Mother   . Cancer Father     lung   History  Substance Use Topics  . Smoking status: Never Smoker   . Smokeless tobacco: Never Used  . Alcohol Use: No     Comment: rare    Review of Systems ROS: Statement: All systems negative except as marked or noted in the HPI; Constitutional: Negative for fever and chills. ; ; Eyes: Negative for eye pain, redness and discharge. ; ; ENMT: Negative for ear pain, hoarseness, nasal congestion, sinus pressure and sore throat. ; ; Cardiovascular: Negative for chest pain, palpitations, diaphoresis, dyspnea and peripheral edema. ; ; Respiratory: Negative for cough, wheezing and stridor. ; ; Gastrointestinal: +N/V, abd pain. Negative for diarrhea, blood in stool, hematemesis, jaundice and rectal bleeding. . ; ; Genitourinary: Negative for dysuria, flank pain and hematuria. ; ; Musculoskeletal: Negative for back pain and neck pain. Negative for swelling and trauma.; ; Skin: Negative for pruritus, rash, abrasions, blisters, bruising and skin lesion.; ; Neuro: Negative for headache, lightheadedness and neck stiffness. Negative for weakness, altered level of consciousness , altered mental status, extremity weakness, paresthesias, involuntary movement, seizure and syncope.      Allergies  Tetanus toxoids  and Yellow dyes (non-tartrazine)  Home Medications   Prior to Admission medications   Medication Sig Start Date End Date Taking? Authorizing Provider  acetaminophen (TYLENOL) 500 MG tablet Take 1,000 mg by mouth once as needed for moderate pain.   Yes Historical Provider, MD  ALPRAZolam Duanne Moron) 1 MG tablet Take 1 mg by mouth 4 (four) times daily as needed for anxiety.    Yes Historical Provider, MD  amLODipine (NORVASC) 10 MG tablet Take 10 mg by mouth every morning.   Yes Historical Provider, MD  atenolol (TENORMIN) 50 MG tablet Take 50 mg by mouth every morning.   Yes Historical Provider, MD  desvenlafaxine  (PRISTIQ) 50 MG 24 hr tablet Take 50 mg by mouth every morning.    Yes Historical Provider, MD  Dietary Management Product (ENLYTE) CAPS Take 1 tablet by mouth daily.   Yes Historical Provider, MD  esomeprazole (NEXIUM) 40 MG capsule Take 40 mg by mouth daily before breakfast.   Yes Historical Provider, MD  fenofibrate (TRICOR) 145 MG tablet Take 145 mg by mouth daily with breakfast.    Yes Historical Provider, MD  furosemide (LASIX) 20 MG tablet Take 20 mg by mouth daily with breakfast.    Yes Historical Provider, MD  gabapentin (NEURONTIN) 400 MG capsule Take 400 mg by mouth 4 (four) times daily as needed (for pain).    Yes Historical Provider, MD  hydrochlorothiazide (HYDRODIURIL) 25 MG tablet Take 25 mg by mouth daily. 08/29/14  Yes Historical Provider, MD  hyoscyamine (LEVBID) 0.375 MG 12 hr tablet Take 0.375 mg by mouth every 12 (twelve) hours as needed for cramping.    Yes Historical Provider, MD  levothyroxine (SYNTHROID, LEVOTHROID) 100 MCG tablet Take 100 mcg by mouth daily before breakfast.   Yes Historical Provider, MD  metroNIDAZOLE (FLAGYL) 500 MG tablet Take 1 tablet (500 mg total) by mouth every 8 (eight) hours. 08/28/14  Yes Bobby Rumpf York, PA-C  Multiple Vitamin (MULTIVITAMIN WITH MINERALS) TABS tablet Take 1 tablet by mouth daily.   Yes Historical Provider, MD  ondansetron (ZOFRAN) 4 MG tablet Take 1 tablet (4 mg total) by mouth every 8 (eight) hours as needed for nausea or vomiting. 07/18/14  Yes M. Doran Stabler, PA-C  potassium chloride SA (K-DUR,KLOR-CON) 20 MEQ tablet Take 20 mEq by mouth 2 (two) times daily.    Yes Historical Provider, MD  tiZANidine (ZANAFLEX) 4 MG tablet Take 4 mg by mouth every 8 (eight) hours as needed for muscle spasms.   Yes Historical Provider, MD  traMADol (ULTRAM) 50 MG tablet Take 50 mg by mouth every 6 (six) hours as needed for moderate pain.    Yes Historical Provider, MD  zolpidem (AMBIEN) 10 MG tablet Take 10 mg by mouth at bedtime.    Yes  Historical Provider, MD   BP 136/73  Pulse 94  Temp(Src) 97.7 F (36.5 C) (Oral)  Resp 18  SpO2 96% Physical Exam 1200: Physical examination:  Nursing notes reviewed; Vital signs and O2 SAT reviewed;  Constitutional: Well developed, Well nourished, In no acute distress; Head:  Normocephalic, atraumatic; Eyes: EOMI, PERRL, +scleral icterus; ENMT: Mouth and pharynx normal, Mucous membranes dry and cracked; Neck: Supple, Full range of motion, No lymphadenopathy; Cardiovascular: Regular rate and rhythm, No gallop; Respiratory: Breath sounds clear & equal bilaterally, No wheezes.  Speaking full sentences with ease, Normal respiratory effort/excursion; Chest: Nontender, Movement normal; Abdomen: Soft, +RLQ tender to palp. No rebound or guarding. Nondistended, Normal bowel sounds; Genitourinary: No CVA tenderness;  Extremities: Pulses normal, No tenderness, No edema, No calf edema or asymmetry.; Neuro: AA&Ox3, Major CN grossly intact.  Speech clear. No gross focal motor or sensory deficits in extremities.; Skin: Color jaundiced, Warm, Dry.   ED Course  Procedures     EKG Interpretation None      MDM  MDM Reviewed: previous chart, nursing note and vitals Reviewed previous: labs, MRI, ultrasound and CT scan Interpretation: labs, x-ray and ultrasound     Date: 09/04/2014  Rate: 83  Rhythm: normal sinus rhythm  QRS Axis: left  Intervals: normal  ST/T Wave abnormalities: normal, LVH  Conduction Disutrbances:none  Narrative Interpretation:   Old EKG Reviewed: unchanged; no significant changes from previous EKG dated 07/17/2014.   Results for orders placed during the hospital encounter of 09/04/14  CBC WITH DIFFERENTIAL      Result Value Ref Range   WBC 14.6 (*) 4.0 - 10.5 K/uL   RBC 4.45  3.87 - 5.11 MIL/uL   Hemoglobin 13.3  12.0 - 15.0 g/dL   HCT 41.3  36.0 - 46.0 %   MCV 92.8  78.0 - 100.0 fL   MCH 29.9  26.0 - 34.0 pg   MCHC 32.2  30.0 - 36.0 g/dL   RDW 15.2  11.5 - 15.5 %    Platelets 446 (*) 150 - 400 K/uL   Neutrophils Relative % 86 (*) 43 - 77 %   Neutro Abs 12.4 (*) 1.7 - 7.7 K/uL   Lymphocytes Relative 10 (*) 12 - 46 %   Lymphs Abs 1.5  0.7 - 4.0 K/uL   Monocytes Relative 4  3 - 12 %   Monocytes Absolute 0.6  0.1 - 1.0 K/uL   Eosinophils Relative 0  0 - 5 %   Eosinophils Absolute 0.1  0.0 - 0.7 K/uL   Basophils Relative 0  0 - 1 %   Basophils Absolute 0.0  0.0 - 0.1 K/uL  COMPREHENSIVE METABOLIC PANEL      Result Value Ref Range   Sodium 141  137 - 147 mEq/L   Potassium 4.1  3.7 - 5.3 mEq/L   Chloride 101  96 - 112 mEq/L   CO2 23  19 - 32 mEq/L   Glucose, Bld 186 (*) 70 - 99 mg/dL   BUN 19  6 - 23 mg/dL   Creatinine, Ser 0.83  0.50 - 1.10 mg/dL   Calcium 9.9  8.4 - 10.5 mg/dL   Total Protein 8.1  6.0 - 8.3 g/dL   Albumin 3.5  3.5 - 5.2 g/dL   AST 33  0 - 37 U/L   ALT 21  0 - 35 U/L   Alkaline Phosphatase 292 (*) 39 - 117 U/L   Total Bilirubin 2.7 (*) 0.3 - 1.2 mg/dL   GFR calc non Af Amer 67 (*) >90 mL/min   GFR calc Af Amer 78 (*) >90 mL/min   Anion gap 17 (*) 5 - 15  URINALYSIS, ROUTINE W REFLEX MICROSCOPIC      Result Value Ref Range   Color, Urine AMBER (*) YELLOW   APPearance CLEAR  CLEAR   Specific Gravity, Urine 1.012  1.005 - 1.030   pH 7.0  5.0 - 8.0   Glucose, UA NEGATIVE  NEGATIVE mg/dL   Hgb urine dipstick NEGATIVE  NEGATIVE   Bilirubin Urine NEGATIVE  NEGATIVE   Ketones, ur NEGATIVE  NEGATIVE mg/dL   Protein, ur NEGATIVE  NEGATIVE mg/dL   Urobilinogen, UA 0.2  0.0 - 1.0 mg/dL  Nitrite NEGATIVE  NEGATIVE   Leukocytes, UA NEGATIVE  NEGATIVE  PROTIME-INR      Result Value Ref Range   Prothrombin Time 13.4  11.6 - 15.2 seconds   INR 1.02  0.00 - 1.49  LIPASE, BLOOD      Result Value Ref Range   Lipase 44  11 - 59 U/L  TROPONIN I      Result Value Ref Range   Troponin I <0.30  <0.30 ng/mL   US Abdomen Limited 09/04/2014   CLINICAL DATA:  Right upper quadrant pain. History of cholecystectomy and common bile duct stent.   EXAM: US ABDOMEN LIMITED - RIGHT UPPER QUADRANT  COMPARISON:  ERCP 08/26/2014 and MRCP 08/25/2014. Ultrasound abdomen 08/23/2014.  FINDINGS: Gallbladder:  Surgically absent.  Common bile duct:  Diameter: Measures up to 12 mm (previously 10 mm on 08/25/2014).  Liver:  No focal lesion identified. Within normal limits in parenchymal echogenicity.  IMPRESSION: Common bile duct is stable to minimally more prominent than on 08/25/2014. Patient is status post cholecystectomy.   Electronically Signed   By: Lorin Picket M.D.   On: 09/04/2014 14:49    1520:  AXR pending since order at 1154. [place] with mild increase in CBD dilatation compared to recent multiple imaging studies. Tbili trending downward.  T/C to GI Dr. Watt Climes, case discussed, including:  HPI, pertinent PM/SHx, VS/PE, dx testing, ED course and treatment:  Requests to admit overnight to medicine service, recheck labs in morning, he will see pt in the morning. Dx and testing d/w pt and family.  Questions answered.  Verb understanding, agreeable to admit.   1545:  T/C to [facility] Dr. Broadus John, case discussed, including:  HPI, pertinent PM/SHx, VS/PE, dx testing, ED course and treatment:  Aware AXR is pending; she is agreeable to admit, requests to write temporary orders, obtain observation medical bed to team [facility]ADMIT.   Dg Abd Acute W/chest 09/04/2014   CLINICAL DATA:  78 year old female with abdominal pain and vomiting. Recent bowel obstruction  EXAM: ACUTE ABDOMEN SERIES (ABDOMEN 2 VIEW & CHEST 1 VIEW)  COMPARISON:  CT abdomen pelvis 08/23/2014, chest x-ray 08/23/2014 new  FINDINGS: Chest:  Low lung volumes persist, which accentuates the interstitium. Interlobular septal thickening, with some indistinctness around the central pulmonary vasculature. No pneumothorax or pleural effusion. No confluent airspace disease.  Surgical changes of prior reverse left shoulder hemi arthroplasty, as well as plate and screw fixation of prior left clavicle fracture.  Surgical  changes of prior cholecystectomy and spinal fixation.  Abdomen:  Multiple borderline dilated small bowel loops. There is a relative paucity of colonic gas. Formed stool within the rectum.  Left decubitus image demonstrates no evidence of layering free air.  The biliary stent projects over the right colon, having migrated from its previous placement on 08/26/2014.  No displaced fracture.  IMPRESSION: Evidence of developing interstitial pulmonary edema.  No evidence of free air, however, multiple borderline dilated small bowel loops suggest ileus and/or developing small bowel obstruction. Further imaging is recommended, such as serial plain film until resolution of the bowel gas pattern, or alternatively CT to evaluate for acute abnormality.  Migration of previous biliary stent, potentially within the small bowel or right colon.  Previous surgical changes, as above.  Signed,  Dulcy Fanny. Earleen Newport, DO  [facility]  [facility]   Electronically Signed   By: Corrie Mckusick O.D.   On: 09/04/2014 15:53    1605:  AXR results as above. T/C to  GI Dr. Watt Climes, case discussed, including:  HPI, pertinent PM/SHx, VS/PE, dx testing, ED course and treatment:  States pt's generally will pass the stent (inform pt and family of this), have [facility] also obtain repeat AXR in the morning, he will consult tomorrow. [facility] Dr. Broadus John updated.     Francine Graven, DO 09/06/14 2151

## 2014-09-04 NOTE — ED Notes (Signed)
Unable to obtain IV x2 RNs.

## 2014-09-04 NOTE — ED Notes (Signed)
MD at bedside. 

## 2014-09-04 NOTE — ED Notes (Signed)
Patient returned from [place]. PLaced back on the monitor.

## 2014-09-04 NOTE — ED Notes (Signed)
Patient transported to Ultrasound 

## 2014-09-05 ENCOUNTER — Observation Stay (HOSPITAL_COMMUNITY): Payer: Medicare Other

## 2014-09-05 ENCOUNTER — Encounter (HOSPITAL_COMMUNITY): Payer: Self-pay | Admitting: General Practice

## 2014-09-05 DIAGNOSIS — D72829 Elevated white blood cell count, unspecified: Secondary | ICD-10-CM

## 2014-09-05 DIAGNOSIS — T85520A Displacement of bile duct prosthesis, initial encounter: Secondary | ICD-10-CM

## 2014-09-05 DIAGNOSIS — I059 Rheumatic mitral valve disease, unspecified: Secondary | ICD-10-CM

## 2014-09-05 DIAGNOSIS — I10 Essential (primary) hypertension: Secondary | ICD-10-CM

## 2014-09-05 DIAGNOSIS — K567 Ileus, unspecified: Secondary | ICD-10-CM

## 2014-09-05 DIAGNOSIS — K6389 Other specified diseases of intestine: Secondary | ICD-10-CM | POA: Diagnosis not present

## 2014-09-05 LAB — CBC WITH DIFFERENTIAL/PLATELET
BASOS PCT: 0 % (ref 0–1)
Basophils Absolute: 0.1 10*3/uL (ref 0.0–0.1)
EOS ABS: 0.1 10*3/uL (ref 0.0–0.7)
Eosinophils Relative: 1 % (ref 0–5)
HEMATOCRIT: 32.7 % — AB (ref 36.0–46.0)
HEMOGLOBIN: 10.6 g/dL — AB (ref 12.0–15.0)
LYMPHS ABS: 1.4 10*3/uL (ref 0.7–4.0)
Lymphocytes Relative: 8 % — ABNORMAL LOW (ref 12–46)
MCH: 30 pg (ref 26.0–34.0)
MCHC: 32.4 g/dL (ref 30.0–36.0)
MCV: 92.6 fL (ref 78.0–100.0)
MONO ABS: 1.5 10*3/uL — AB (ref 0.1–1.0)
Monocytes Relative: 9 % (ref 3–12)
NEUTROS PCT: 82 % — AB (ref 43–77)
Neutro Abs: 13.7 10*3/uL — ABNORMAL HIGH (ref 1.7–7.7)
Platelets: 319 10*3/uL (ref 150–400)
RBC: 3.53 MIL/uL — AB (ref 3.87–5.11)
RDW: 15 % (ref 11.5–15.5)
WBC: 16.7 10*3/uL — ABNORMAL HIGH (ref 4.0–10.5)

## 2014-09-05 LAB — COMPREHENSIVE METABOLIC PANEL
ALT: 14 U/L (ref 0–35)
AST: 22 U/L (ref 0–37)
Albumin: 2.7 g/dL — ABNORMAL LOW (ref 3.5–5.2)
Alkaline Phosphatase: 208 U/L — ABNORMAL HIGH (ref 39–117)
Anion gap: 15 (ref 5–15)
BUN: 25 mg/dL — ABNORMAL HIGH (ref 6–23)
CHLORIDE: 97 meq/L (ref 96–112)
CO2: 24 meq/L (ref 19–32)
CREATININE: 1.19 mg/dL — AB (ref 0.50–1.10)
Calcium: 8.7 mg/dL (ref 8.4–10.5)
GFR calc Af Amer: 50 mL/min — ABNORMAL LOW (ref >90)
GFR, EST NON AFRICAN AMERICAN: 44 mL/min — AB (ref >90)
Glucose, Bld: 111 mg/dL — ABNORMAL HIGH (ref 70–99)
Potassium: 3.8 mEq/L (ref 3.7–5.3)
Sodium: 136 mEq/L — ABNORMAL LOW (ref 137–147)
Total Bilirubin: 2.5 mg/dL — ABNORMAL HIGH (ref 0.3–1.2)
Total Protein: 6.6 g/dL (ref 6.0–8.3)

## 2014-09-05 LAB — CBC
HCT: 33.7 % — ABNORMAL LOW (ref 36.0–46.0)
Hemoglobin: 10.8 g/dL — ABNORMAL LOW (ref 12.0–15.0)
MCH: 30.7 pg (ref 26.0–34.0)
MCHC: 32 g/dL (ref 30.0–36.0)
MCV: 95.7 fL (ref 78.0–100.0)
PLATELETS: 323 10*3/uL (ref 150–400)
RBC: 3.52 MIL/uL — ABNORMAL LOW (ref 3.87–5.11)
RDW: 15.3 % (ref 11.5–15.5)
WBC: 19.6 10*3/uL — AB (ref 4.0–10.5)

## 2014-09-05 MED ORDER — FLEET ENEMA 7-19 GM/118ML RE ENEM
1.0000 | ENEMA | Freq: Once | RECTAL | Status: DC
  Filled 2014-09-05: qty 1

## 2014-09-05 MED ORDER — METOCLOPRAMIDE HCL 5 MG/ML IJ SOLN
10.0000 mg | Freq: Three times a day (TID) | INTRAMUSCULAR | Status: DC
  Filled 2014-09-05 (×4): qty 2

## 2014-09-05 MED ORDER — SODIUM CHLORIDE 0.9 % IV SOLN
INTRAVENOUS | Status: DC
  Administered 2014-09-05: 11:00:00 via INTRAVENOUS

## 2014-09-05 MED ORDER — POLYETHYLENE GLYCOL 3350 17 GM/SCOOP PO POWD
1.0000 | Freq: Once | ORAL | Status: AC
  Administered 2014-09-05: 255 g via ORAL
  Filled 2014-09-05: qty 255

## 2014-09-05 MED ORDER — BOOST / RESOURCE BREEZE PO LIQD
1.0000 | Freq: Three times a day (TID) | ORAL | Status: DC
Start: 2014-09-05 — End: 2014-09-08
  Administered 2014-09-05 – 2014-09-08 (×5): 1 via ORAL

## 2014-09-05 MED ORDER — DOCUSATE SODIUM 100 MG PO CAPS
100.0000 mg | ORAL_CAPSULE | Freq: Every day | ORAL | Status: DC
  Administered 2014-09-06 – 2014-09-08 (×3): 100 mg via ORAL
  Filled 2014-09-05 (×3): qty 1

## 2014-09-05 MED ORDER — PROMETHAZINE HCL 25 MG/ML IJ SOLN
12.5000 mg | Freq: Four times a day (QID) | INTRAMUSCULAR | Status: DC | PRN
  Administered 2014-09-06 – 2014-09-07 (×3): 12.5 mg via INTRAVENOUS
  Filled 2014-09-05 (×3): qty 1

## 2014-09-05 MED ORDER — HYDROMORPHONE HCL 1 MG/ML IJ SOLN
1.0000 mg | INTRAMUSCULAR | Status: DC | PRN
  Administered 2014-09-05 – 2014-09-07 (×4): 1 mg via INTRAVENOUS
  Filled 2014-09-05 (×4): qty 1

## 2014-09-05 NOTE — Progress Notes (Signed)
[name] [name] 11:48 AM  Subjective: Patient seen and examined and discussed with the hospital team and her husband and well-known to me from previous hospital stay and she was fine at home except for a chill about a week ago which she has had in the past and no medical attention was seeked but then she started vomiting and having upper abdominal pain and presented to the emergency room and had obvious stent migration and probable ileus versus partial obstruct and an elevated white count but her liver tests were decreased and no other new complaint  Objective: Vital signs stable afebrile no acute distress abdomen is mildly tender throughout with good bowel sound minimal guarding no rebound labs and x-rays reviewed and x-ray unchanged over 12 hours and white count slight increase but liver tests continue to decrease  Assessment: Probable pain nausea vomiting from partial obstruction from stent in patient with recent distal biliary stricture awaiting EUS on Wednesday already scheduled  Plan: Will try some laxatives and see if stent we'll move and make her feel better and she might need a colonoscopy to retrieve it but hopefully it'll pass on its own and we'll watch liver tests closely while she is here and consider a CT scan and if she does not improve in all was discussed with the husband and medical team who agrees with the plan [facility] E

## 2014-09-05 NOTE — Progress Notes (Signed)
[facility]  Patient Status: Active (receiving services up to time of hospitalization)  [facility] is providing the following services: RN and PT  If patient discharges after hours, please call [phone].   Consepcion Hearing 09/05/2014, 4:37 PM

## 2014-09-05 NOTE — Progress Notes (Signed)
Echocardiogram 2D Echocardiogram has been performed.  [name] [name] 09/05/2014, 9:49 AM

## 2014-09-05 NOTE — Progress Notes (Signed)
INITIAL NUTRITION ASSESSMENT  DOCUMENTATION CODES Per approved criteria  -Not Applicable   INTERVENTION: Resource Breeze po TID, each supplement provides 250 kcal and 9 grams of protein  NUTRITION DIAGNOSIS: Inadequate oral intake related to altered GI function as evidenced by NPO/clear liquids, n/v.   Goal: Pt will meet >90% of estimated nutritional needs  Monitor:  Po/supplement intake, labs, weight changes, I/O's  Reason for Assessment: MST=4  78 y.o. female  Admitting Dx: <principal problem not specified>  [name] [name] is a 78 y.o. female with PMH of hypertension, irritable bowel syndrome, hypothyroidism, GERD was just discharged from [facility] 9/16 after admission for obstructive jaundice due to biliary stricture, she had MRCP, and ERCP with temporary stent biliary placed 9/14 per Dr.Magod, she did well after this her LFTs started trending down and was discharged home 9/16.  ASSESSMENT: Pt admitted with n/v from partial obstruction from stent. EUS pending. Pt may need colonoscopy tomorrow to retrieve stent. Pt is being given Miralax in attempt to get stent out. Possible d/c 09/06/14 with close GI follow-up.  Attempted to visit pt x 3 however, pt unavailable due to being evaluated by other healthcare providers.  Pt with poor po intake PTA due to n/v. Documented wt hx reveals UBW of 137#. Noted minimal weight loss over the past 1-2 months.  Labs reviewed. Na: 136. BUN/Creat: 25/1.19. Glucose: 111.   Height: Ht Readings from Last 1 Encounters:  09/04/14 5\' 1"  (1.549 m)    Weight: Wt Readings from Last 1 Encounters:  09/04/14 135 lb 9.3 oz (61.5 kg)    Ideal Body Weight: 105#  % Ideal Body Weight: 129%  Wt Readings from Last 10 Encounters:  09/04/14 135 lb 9.3 oz (61.5 kg)  08/23/14 139 lb (63.05 kg)  08/23/14 139 lb (63.05 kg)  08/23/14 139 lb (63.05 kg)  07/18/14 137 lb (62.143 kg)  07/18/14 137 lb (62.143 kg)  07/12/12 131 lb 6.4 oz (59.603 kg)  06/08/12 136 lb  (61.689 kg)  06/08/12 136 lb (61.689 kg)  06/01/12 137 lb 11.2 oz (62.46 kg)    Usual Body Weight: 137#  % Usual Body Weight: 99%  BMI:  Body mass index is 25.63 kg/(m^2). Overweight  Estimated Nutritional Needs: Kcal: 1600-1800 Protein: 74-84 grams Fluid: 1.6-1.8 L  Skin: ecchymosis rt arm and anterior lt leg  Diet Order: NPO  EDUCATION NEEDS: -Education not appropriate at this time   Intake/Output Summary (Last 24 hours) at 09/05/14 0952 Last data filed at 09/04/14 2217  Gross per 24 hour  Intake      0 ml  Output    200 ml  Net   -200 ml    Last BM: 09/02/14  Labs:   Recent Labs Lab 09/04/14 1037 09/05/14 0657  NA 141 136*  K 4.1 3.8  CL 101 97  CO2 23 24  BUN 19 25*  CREATININE 0.83 1.19*  CALCIUM 9.9 8.7  GLUCOSE 186* 111*    CBG (last 3)  No results found for this basename: GLUCAP,  in the last 72 hours  Scheduled Meds: . amLODipine  10 mg Oral q morning - 10a  . atenolol  50 mg Oral q morning - 10a  . docusate sodium  100 mg Oral Daily  . enoxaparin (LOVENOX) injection  40 mg Subcutaneous Q24H  . fenofibrate  160 mg Oral Daily  . furosemide  20 mg Intravenous Q12H  . levothyroxine  100 mcg Oral QAC breakfast  . metroNIDAZOLE  500 mg Oral 3  times per day  . multivitamin with minerals  1 tablet Oral Daily  . pantoprazole  40 mg Oral Daily  . potassium chloride SA  20 mEq Oral BID  . sodium phosphate  1 enema Rectal Once    Continuous Infusions: . sodium chloride      Past Medical History  Diagnosis Date  . Hypertension   . Hyperlipidemia   . Thyroid disease   . GERD (gastroesophageal reflux disease)   . Anxiety   . IBS (irritable bowel syndrome)   . Back pain   . Blood transfusion     at age of 4  . Hypothyroidism   . H/O hiatal hernia   . Anemia     hx of years ago   . Arthritis     psoriatic arthritis  . ARF (acute renal failure) 08/23/2014  . Pancreatitis     Past Surgical History  Procedure Laterality Date  .  Ventral hernia repair  06/08/2012    Procedure: LAPAROSCOPIC VENTRAL HERNIA;  Surgeon: Adin Hector, MD;  Location: [facility];  Service: General;  Laterality: Right;  . Back surgery  2010    thoractic-screws and rods  . Joint replacement  2005    left shoulder replacement   . Cholecystectomy  1988    lap  . Abdominal hysterectomy      partial  . Colonoscopy    . Ercp    . Orif clavicular fracture Left 07/18/2014    Procedure: OPEN REDUCTION INTERNAL FIXATION (ORIF) LEFT CLAVICULAR FRACTURE;  Surgeon: Ninetta Lights, MD;  Location: [place];  Service: Orthopedics;  Laterality: Left;  . Ercp N/A 08/26/2014    Procedure: ENDOSCOPIC RETROGRADE CHOLANGIOPANCREATOGRAPHY (ERCP);  Surgeon: Jeryl Columbia, MD;  Location: [facility] ENDOSCOPY;  Service: Endoscopy;  Laterality: N/A;  . Tonsillectomy  as child    Heiress Williamson A. Jimmye Norman, RD, LDN Pager: [phone] After hours Pager: [phone]

## 2014-09-05 NOTE — Progress Notes (Addendum)
PROGRESS NOTE  [name] [name] CVE:[id number] DOB: [birth year] DOA: 09/04/2014 PCP: Horton Finer, MD  HPI/Subjective: Patient is a 78 yo female with a prior history of hypertension, IBS, hypothyroidism, GERD who was recently hospitalized for obstructive jaundice due to biliary stricture.  She had an MRCP, ERCP with temporary stent placement 9/14 per Dr. Watt Climes.  She is re-admitted with abdominal pain, nausea and vomiting on 9/24 and KUB showed migration of stent.   Appears comfortable in exam room today.  C/O cramping epigastric pain, nausea, vomiting.  Denies diarrhea.    Assessment/Plan: Biliary stricture with migrated stent  -Bile duct brushings with Atypical cells, no malignancy noted on Path  -KuB on 9/24 shows no further migration of stent overnight. Remains in the Small bowel. -Dr.Magod consulted.  He recommends gentle miralax prep to attempt to was the stent out.  Reassess 9/25. -If stent dislodges - advance diet - and likely d/c 9/25.  Will follow up with GI next week to reconsider stenting  -Clear liquid diet.  Ileus with nausea and vomiting. -Due to stent and constipation. -Abdominal radiograph shows small bowel dilatation with rectal stool impaction -GI is following. -Monitor electrolytes.  Ambulate as tolerated. -Give Miralax and Fleet enema.   -Supportive care with phenergan and zofran  Leukocytosis -Unclear etiology -Possibly due to stent obstruction.  Urine and cxr clear.  LFTs continue to trend down.  Patient afebrile.  Unlikely cholangitis. -Recent C-diff.  Now constipated.  Continues on Flagyl PO TID. -Continue to monitor.  Ordered Differential added on to CBC 9/24.    Acute Kidney Injury - Likely pre-renal due to N/V and diuretics. - BUN of 25 and Cr of 1.19 on 9/24.   - Discontinue IV lasix.   PO lasix and HCTZ being held - Recheck BMP tomorrow.    Mild Interstitial Pulm edema  -Asymptomatic  -Will discontinue IV lasix, and saline lock fluids. -Echo  completed - shows grade 1 diastolic dysfunction with no wall motion abnormalities.  EF 60-65% -Follow I/Os, weights   Hypertension  -Continue home regimen, except HCTZ & lasix  GERD (gastroesophageal reflux disease)  -Continue PPI   Anxiety  -Continue anxiolytics PRN   Protein-calorie malnutrition, severe  -To be addressed when N/V and ileus improves  Clostridium difficile colitis  -No diarrhea at this time.  Rather constipation. -Complete Flagyl regimen, last day 9/28, based on DC summary  DVT Prophylaxis:  Lovenox  Code Status: Full Family Communication: Husband at bedside. Disposition Plan: Remain inpatient and DC to home when medically stable.  Consultants:  Gastroenterology  Antibiotics: Anti-infectives   Start     Dose/Rate Route Frequency Ordered Stop   09/04/14 1715  metroNIDAZOLE (FLAGYL) tablet 500 mg     500 mg Oral 3 times per day 09/04/14 1707        Objective: Filed Vitals:   09/04/14 1951 09/04/14 2125 09/05/14 0520 09/05/14 1047  BP: 157/57 141/57 127/51 130/60  Pulse: 107 95 76 72  Temp: 98 F (36.7 C) 99.4 F (37.4 C) 98.2 F (36.8 C)   TempSrc: Oral Oral Oral   Resp: 20 20 18    Height: 5\' 1"  (1.549 m)     Weight: 61.5 kg (135 lb 9.3 oz)     SpO2: 96% 94% 93%     Intake/Output Summary (Last 24 hours) at 09/05/14 1134 Last data filed at 09/04/14 2217  Gross per 24 hour  Intake      0 ml  Output    200 ml  Net   -200 ml   Filed Weights   09/04/14 1951  Weight: 61.5 kg (135 lb 9.3 oz)    Exam: General: Elderly female, NAD, appears stated age  14:  Icteic Sclera, MMM. Neck is supple, no JVD, no masses.  Cardiovascular:  RRR, S1 S2 auscultated, no rubs, murmurs or gallops.   Respiratory: Clear to auscultation bilaterally with equal chest rise  Abdomen: Soft, nondistended, + bowel sounds, mild tenderness to palpation throughout abdomen.  Extremities: Warm dry without cyanosis clubbing or edema.  Neuro: AAOx3, cranial nerves  grossly intact. Strength 5/5 in upper and lower extremities  Skin:  No evidence of jaundice in skin. Psych: Normal affect and demeanor with intact judgement and insight  Data Reviewed: Basic Metabolic Panel:  Recent Labs Lab 09/04/14 1037 09/05/14 0657  NA 141 136*  K 4.1 3.8  CL 101 97  CO2 23 24  GLUCOSE 186* 111*  BUN 19 25*  CREATININE 0.83 1.19*  CALCIUM 9.9 8.7   Liver Function Tests:  Recent Labs Lab 09/04/14 1037 09/05/14 0657  AST 33 22  ALT 21 14  ALKPHOS 292* 208*  BILITOT 2.7* 2.5*  PROT 8.1 6.6  ALBUMIN 3.5 2.7*    Recent Labs Lab 09/04/14 1426  LIPASE 44   CBC  Recent Labs Lab 09/04/14 1037 09/05/14 0657  WBC 14.6* 19.6*  NEUTROABS 12.4*  --   HGB 13.3 10.8*  HCT 41.3 33.7*  MCV 92.8 95.7  PLT 446* 323   Cardiac Enzymes:  Recent Labs Lab 09/04/14 1426  TROPONINI <0.30   Studies:  2D Echo Study Conclusions  - Left ventricle: The cavity size was normal. Wall thickness was normal. Systolic function was normal. The estimated ejection fraction was in the range of 60% to 65%. Wall motion was normal; there were no regional wall motion abnormalities. There was an increased relative contribution of atrial contraction to ventricular filling. Doppler parameters are consistent with abnormal left ventricular relaxation (grade 1 diastolic dysfunction). - Aortic valve: Moderate diffuse thickening and calcification, consistent with sclerosis. - Mitral valve: There was mild regurgitation. - Atrial septum: No defect or patent foramen ovale was identified. - Tricuspid valve: There was trivial regurgitation.   Dg Abd 1 View  09/05/2014   CLINICAL DATA:  Biliary stent.  EXAM: ABDOMEN - 1 VIEW  COMPARISON:  September 04, 2014.  FINDINGS: Status post posterior fusion of lower thoracic and lumbar spine. Status post cholecystectomy. Biliary stent is unchanged in position along the right side of the abdomen most likely within the cecum. Mildly dilated  small bowel loops are again noted and unchanged suggesting possible ileus. Stool is noted in the rectum.  IMPRESSION: Biliary stent is unchanged in position within the cecum. Stable mild small bowel dilatation is noted most consistent with ileus.   Electronically Signed   By: Sabino Dick M.D.   On: 09/05/2014 08:53   US Abdomen Limited  09/04/2014   CLINICAL DATA:  Right upper quadrant pain. History of cholecystectomy and common bile duct stent.  EXAM: US ABDOMEN LIMITED - RIGHT UPPER QUADRANT  COMPARISON:  ERCP 08/26/2014 and MRCP 08/25/2014. Ultrasound abdomen 08/23/2014.  FINDINGS: Gallbladder:  Surgically absent.  Common bile duct:  Diameter: Measures up to 12 mm (previously 10 mm on 08/25/2014).  Liver:  No focal lesion identified. Within normal limits in parenchymal echogenicity.  IMPRESSION: Common bile duct is stable to minimally more prominent than on 08/25/2014. Patient is status post cholecystectomy.   Electronically Signed   By:  Lorin Picket M.D.   On: 09/04/2014 14:49   Dg Abd Acute W/chest  09/04/2014   CLINICAL DATA:  78 year old female with abdominal pain and vomiting. Recent bowel obstruction  EXAM: ACUTE ABDOMEN SERIES (ABDOMEN 2 VIEW & CHEST 1 VIEW)  COMPARISON:  CT abdomen pelvis 08/23/2014, chest x-ray 08/23/2014 new  FINDINGS: Chest:  Low lung volumes persist, which accentuates the interstitium. Interlobular septal thickening, with some indistinctness around the central pulmonary vasculature. No pneumothorax or pleural effusion. No confluent airspace disease.  Surgical changes of prior reverse left shoulder hemi arthroplasty, as well as plate and screw fixation of prior left clavicle fracture.  Surgical changes of prior cholecystectomy and spinal fixation.  Abdomen:  Multiple borderline dilated small bowel loops. There is a relative paucity of colonic gas. Formed stool within the rectum.  Left decubitus image demonstrates no evidence of layering free air.  The biliary stent projects  over the right colon, having migrated from its previous placement on 08/26/2014.  No displaced fracture.  IMPRESSION: Evidence of developing interstitial pulmonary edema.  No evidence of free air, however, multiple borderline dilated small bowel loops suggest ileus and/or developing small bowel obstruction. Further imaging is recommended, such as serial plain film until resolution of the bowel gas pattern, or alternatively CT to evaluate for acute abnormality.  Migration of previous biliary stent, potentially within the small bowel or right colon.  Previous surgical changes, as above.  Signed,  Dulcy Fanny. Earleen Newport, DO  [facility]  [facility]   Electronically Signed   By: Corrie Mckusick O.D.   On: 09/04/2014 15:53    Scheduled Meds: . amLODipine  10 mg Oral q morning - 10a  . atenolol  50 mg Oral q morning - 10a  . docusate sodium  100 mg Oral Daily  . enoxaparin (LOVENOX) injection  40 mg Subcutaneous Q24H  . fenofibrate  160 mg Oral Daily  . furosemide  20 mg Intravenous Q12H  . levothyroxine  100 mcg Oral QAC breakfast  . metroNIDAZOLE  500 mg Oral 3 times per day  . multivitamin with minerals  1 tablet Oral Daily  . pantoprazole  40 mg Oral Daily  . polyethylene glycol powder  1 Container Oral Once  . potassium chloride SA  20 mEq Oral BID  . sodium phosphate  1 enema Rectal Once   Continuous Infusions: . sodium chloride 75 mL/hr at 09/05/14 1046    Principal Problem:   Nausea and vomiting Active Problems:   Common bile duct (CBD) stricture   Migration of biliary stent   Enteritis due to Clostridium difficile   Ileus   Leukocytosis, unspecified   Hypertension   GERD (gastroesophageal reflux disease)   Anxiety   Protein-calorie malnutrition, severe   Pulmonary edema   Rockwell Germany, PA-S2 Imogene Burn, PA-C  [facility] Pager [phone]. If 7PM-7AM, please contact night-coverage at [email or web address], password [facility] 09/05/2014,  11:34 AM  LOS: 1 day   Attending Patient was seen, examined,treatment plan was discussed with the Physician extender. I have directly reviewed the clinical findings, lab, imaging studies and management of this patient in detail. I have made the necessary changes to the above noted documentation, and agree with the documentation, as recorded by the Physician extender.  Recent admission for obstructive jaundice from biliary stricture-brushing neg for malignancy, stent placed and discharged. Readmitted for PSBO from stent migration. Seen by GI-supportive care with laxative to see if passes stent, otherwise will need a colonoscopy and stent  retrievalNena Alexander MD [facility].

## 2014-09-05 NOTE — Care Management Note (Signed)
    Page 1 of 1   09/06/2014     12:16:46 PM CARE MANAGEMENT NOTE 09/06/2014  Patient:  ALAIJAH, GIBLER   Account Number:  [id number]  Date Initiated:  09/05/2014  Documentation initiated by:  Tomi Bamberger  Subjective/Objective Assessment:   dx n/v, ileus  admit- lives with spouse.     Action/Plan:   Anticipated DC Date:  09/07/2014   Anticipated DC Plan:  [place]  CM consult      Choice offered to / List presented to:             Status of service:  In process, will continue to follow Medicare Important Message given?  YES (If response is "NO", the following Medicare IM given date fields will be blank) Date Medicare IM given:  09/06/2014 Medicare IM given by:  Tomi Bamberger Date Additional Medicare IM given:   Additional Medicare IM given by:    Discharge Disposition:    Per UR Regulation:  Reviewed for med. necessity/level of care/duration of stay  If discussed at [place] of Stay Meetings, dates discussed:    Comments:  09/05/14 [place], BSN [phone] patient with n/v, and ileus, NCM will continue to follow for dc needs.

## 2014-09-06 ENCOUNTER — Inpatient Hospital Stay (HOSPITAL_COMMUNITY): Payer: Medicare Other

## 2014-09-06 DIAGNOSIS — N179 Acute kidney failure, unspecified: Secondary | ICD-10-CM

## 2014-09-06 DIAGNOSIS — K219 Gastro-esophageal reflux disease without esophagitis: Secondary | ICD-10-CM

## 2014-09-06 LAB — COMPREHENSIVE METABOLIC PANEL
ALBUMIN: 2.7 g/dL — AB (ref 3.5–5.2)
ALT: 12 U/L (ref 0–35)
AST: 20 U/L (ref 0–37)
Alkaline Phosphatase: 184 U/L — ABNORMAL HIGH (ref 39–117)
Anion gap: 15 (ref 5–15)
BUN: 16 mg/dL (ref 6–23)
CALCIUM: 9.2 mg/dL (ref 8.4–10.5)
CO2: 23 mEq/L (ref 19–32)
Chloride: 101 mEq/L (ref 96–112)
Creatinine, Ser: 0.79 mg/dL (ref 0.50–1.10)
GFR calc non Af Amer: 79 mL/min — ABNORMAL LOW (ref >90)
GLUCOSE: 138 mg/dL — AB (ref 70–99)
Potassium: 4 mEq/L (ref 3.7–5.3)
Sodium: 139 mEq/L (ref 137–147)
TOTAL PROTEIN: 6.7 g/dL (ref 6.0–8.3)
Total Bilirubin: 2.1 mg/dL — ABNORMAL HIGH (ref 0.3–1.2)

## 2014-09-06 LAB — CBC WITH DIFFERENTIAL/PLATELET
BASOS PCT: 1 % (ref 0–1)
Basophils Absolute: 0.1 10*3/uL (ref 0.0–0.1)
EOS PCT: 1 % (ref 0–5)
Eosinophils Absolute: 0.1 10*3/uL (ref 0.0–0.7)
HCT: 33.3 % — ABNORMAL LOW (ref 36.0–46.0)
HEMOGLOBIN: 10.7 g/dL — AB (ref 12.0–15.0)
LYMPHS ABS: 0.9 10*3/uL (ref 0.7–4.0)
Lymphocytes Relative: 9 % — ABNORMAL LOW (ref 12–46)
MCH: 29.8 pg (ref 26.0–34.0)
MCHC: 32.1 g/dL (ref 30.0–36.0)
MCV: 92.8 fL (ref 78.0–100.0)
MONOS PCT: 9 % (ref 3–12)
Monocytes Absolute: 0.9 10*3/uL (ref 0.1–1.0)
NEUTROS PCT: 80 % — AB (ref 43–77)
Neutro Abs: 8.4 10*3/uL — ABNORMAL HIGH (ref 1.7–7.7)
PLATELETS: 305 10*3/uL (ref 150–400)
RBC: 3.59 MIL/uL — AB (ref 3.87–5.11)
RDW: 14.7 % (ref 11.5–15.5)
WBC: 10.4 10*3/uL (ref 4.0–10.5)

## 2014-09-06 MED ORDER — POLYETHYLENE GLYCOL 3350 17 GM/SCOOP PO POWD
255.0000 g | Freq: Once | ORAL | Status: AC
  Administered 2014-09-06: 255 g via ORAL
  Filled 2014-09-06: qty 255

## 2014-09-06 MED ORDER — VENLAFAXINE HCL ER 75 MG PO CP24
75.0000 mg | ORAL_CAPSULE | Freq: Every day | ORAL | Status: DC
  Administered 2014-09-07 – 2014-09-08 (×2): 75 mg via ORAL
  Filled 2014-09-06 (×3): qty 1

## 2014-09-06 MED ORDER — FAMOTIDINE 20 MG PO TABS
20.0000 mg | ORAL_TABLET | Freq: Every day | ORAL | Status: DC
  Administered 2014-09-06 – 2014-09-08 (×3): 20 mg via ORAL
  Filled 2014-09-06 (×3): qty 1

## 2014-09-06 NOTE — Progress Notes (Signed)
[name] [name] 12:20 PM  Subjective: Patient is doing much better and has had multiple bowel movements and is tolerating clear liquid and has no new complaints  Objective: Vital signs stable afebrile no acute distress abdomen is soft nontender good bowel sounds labs improved both white count and liver tests and x-ray seems improved to me and stent does seem to be more medial and possibly in a different spot to me Assessment: Multiple medical problems including improved biliary stricture with stent migration  Plan: I had a very long talk with the patient and her family and we discussed advancing her diet and seeing how she does and hoping this stent will pass on its own but they are uncomfortable taking her home and would rather continue laxatives and clear liquid and probably if she does not pass it I will ask my partner to try to retrieve it with a colonoscopy tomorrow and the risks of that was discussed and we also rediscussed the upcoming EUS and possible biliary endoscopy and a decision about restenting or not which is scheduled as an outpatient on Wednesday and please call me sooner when necessary particularly if stent passes with prep so we can advance diet and stop laxatives at that time [facility]

## 2014-09-06 NOTE — Progress Notes (Signed)
PATIENT DETAILS Name: [name] [name] Age: 78 y.o. Sex: female Date of Birth: 1936/03/21 Admit Date: 09/04/2014 Admitting Physician Domenic Polite, MD [id number] [name], MD  Subjective: Minimal abd pain-feels much better  Assessment/Plan: Principal Problem: Mild ileus with nausea/vomiting - Likely secondary to dislodged biliary stent - Admitted and given laxatives, stent did not pass, essentially at the same level on x-ray of the abdomen today. - Seen by gastroenterology, plans are to continue with laxatives- repeat x-ray in a.m.-if stent still in place, and GI planning a colonoscopy tomorrow.  Biliary stricture with migrated stent  -Bile duct brushings with Atypical cells, no malignancy noted on Path  -KuB on 9/24 shows no further migration of stent overnight. See above for further details - Per GI note-US with possible biliary endoscopy/restenting scheduled for this coming Wednesday as outpatient. LFTs show decreasing bilirubin and alkaline phosphatase levels. Patient afebrile and without any abdominal pain this morning   Leukocytosis  -Unclear etiology- suspect secondary to stress margination - Continue to monitor off antibiotics- remains on Flagyl for recent C. difficile colitis. Remains afebrile and nontoxic looking. No clinical signs/symptoms of cholangitis at this time .  Acute Kidney Injury  - Likely pre-renal due to N/V and diuretics.  - Resolved with IV fluids  Mild Interstitial Pulm edema  -Asymptomatic,Will discontinue IV lasix, and saline lock fluids.  -Echo completed - shows grade 1 diastolic dysfunction with no wall motion abnormalities. EF 60-65%  -Follow I/Os, weights   Hypertension  - Controlled-Continue home regimen, except HCTZ & lasix   GERD (gastroesophageal reflux disease)  - Discontinue PPI-place on Pepcid given  C. difficile colitis  Anxiety  -Continue anxiolytics PRN   Protein-calorie malnutrition, severe  -To be addressed when  N/V and ileus improves   Clostridium difficile colitis  -No diarrhea at this time. Rather constipation.  -Complete Flagyl regimen, last day 9/28, based on DC summary   History of depression - Resume Pristiq  Hypothyroidism - Continue with levothyroxine  Disposition: Remain inpatient  DVT Prophylaxis: Prophylactic Lovenox   Code Status: Full code   Family Communication Spouse at bedside  Procedures:  None  CONSULTS:  GI  Time spent 40 minutes-which includes 50% of the time with face-to-face with patient/ family and coordinating care related to the above assessment and plan.    MEDICATIONS: Scheduled Meds: . amLODipine  10 mg Oral q morning - 10a  . atenolol  50 mg Oral q morning - 10a  . docusate sodium  100 mg Oral Daily  . enoxaparin (LOVENOX) injection  40 mg Subcutaneous Q24H  . feeding supplement (RESOURCE BREEZE)  1 Container Oral TID BM  . fenofibrate  160 mg Oral Daily  . levothyroxine  100 mcg Oral QAC breakfast  . metroNIDAZOLE  500 mg Oral 3 times per day  . multivitamin with minerals  1 tablet Oral Daily  . pantoprazole  40 mg Oral Daily  . potassium chloride SA  20 mEq Oral BID  . sodium phosphate  1 enema Rectal Once   Continuous Infusions:  PRN Meds:.acetaminophen, acetaminophen, ALPRAZolam, gabapentin, HYDROmorphone (DILAUDID) injection, ondansetron (ZOFRAN) IV, ondansetron, promethazine, zolpidem  Antibiotics: Anti-infectives   Start     Dose/Rate Route Frequency Ordered Stop   09/04/14 1715  metroNIDAZOLE (FLAGYL) tablet 500 mg     500 mg Oral 3 times per day 09/04/14 1707         PHYSICAL EXAM: Vital signs in last 24 hours: Filed Vitals:  09/05/14 2311 09/06/14 0518 09/06/14 1001 09/06/14 1325  BP: 158/57 137/80 140/76 149/61  Pulse: 70 80 80 74  Temp: 98.3 F (36.8 C) 99 F (37.2 C)  99.3 F (37.4 C)  TempSrc: Oral Oral  Oral  Resp: 16 20  20   Height:      Weight:      SpO2: 96% 97%  96%    Weight change:  Filed  Weights   09/04/14 1951  Weight: 61.5 kg (135 lb 9.3 oz)   Body mass index is 25.63 kg/(m^2).   Gen Exam: Awake and alert with clear speech.   Neck: Supple, No JVD.   Chest: B/L Clear.   CVS: S1 S2 Regular, no murmurs.  Abdomen: soft, BS +, non tender, non distended.  Extremities: no edema, lower extremities warm to touch. Neurologic: Non Focal.   Skin: No Rash.   Wounds: N/A.   Intake/Output from previous day:  Intake/Output Summary (Last 24 hours) at 09/06/14 1452 Last data filed at 09/05/14 1933  Gross per 24 hour  Intake    120 ml  Output      1 ml  Net    119 ml     LAB RESULTS: CBC  Recent Labs Lab 09/04/14 1037 09/05/14 0657 09/05/14 1235 09/06/14 0929  WBC 14.6* 19.6* 16.7* 10.4  HGB 13.3 10.8* 10.6* 10.7*  HCT 41.3 33.7* 32.7* 33.3*  PLT 446* 323 319 305  MCV 92.8 95.7 92.6 92.8  MCH 29.9 30.7 30.0 29.8  MCHC 32.2 32.0 32.4 32.1  RDW 15.2 15.3 15.0 14.7  LYMPHSABS 1.5  --  1.4 0.9  MONOABS 0.6  --  1.5* 0.9  EOSABS 0.1  --  0.1 0.1  BASOSABS 0.0  --  0.1 0.1    Chemistries   Recent Labs Lab 09/04/14 1037 09/05/14 0657 09/06/14 0929  NA 141 136* 139  K 4.1 3.8 4.0  CL 101 97 101  CO2 23 24 23   GLUCOSE 186* 111* 138*  BUN 19 25* 16  CREATININE 0.83 1.19* 0.79  CALCIUM 9.9 8.7 9.2    CBG: No results found for this basename: GLUCAP,  in the last 168 hours  GFR Estimated Creatinine Clearance: 51.1 ml/min (by C-G formula based on Cr of 0.79).  Coagulation profile  Recent Labs Lab 09/04/14 1153  INR 1.02    Cardiac Enzymes  Recent Labs Lab 09/04/14 1426  TROPONINI <0.30    No components found with this basename: POCBNP,  No results found for this basename: DDIMER,  in the last 72 hours No results found for this basename: HGBA1C,  in the last 72 hours No results found for this basename: CHOL, HDL, LDLCALC, TRIG, CHOLHDL, LDLDIRECT,  in the last 72 hours No results found for this basename: TSH, T4TOTAL, FREET3, T3FREE,  THYROIDAB,  in the last 72 hours No results found for this basename: VITAMINB12, FOLATE, FERRITIN, TIBC, IRON, RETICCTPCT,  in the last 72 hours  Recent Labs  09/04/14 1426  LIPASE 44    Urine Studies No results found for this basename: UACOL, UAPR, USPG, UPH, UTP, UGL, UKET, UBIL, UHGB, UNIT, UROB, ULEU, UEPI, UWBC, URBC, UBAC, CAST, CRYS, UCOM, BILUA,  in the last 72 hours  MICROBIOLOGY: No results found for this or any previous visit (from the past 240 hour(s)).  RADIOLOGY STUDIES/RESULTS: Ct Abdomen Pelvis Wo Contrast  08/23/2014   CLINICAL DATA:  Anorexia. Generalized weakness. Symptoms worse over the last 8 weeks. 15 lb weight loss. Frequent falls. Jaundice. No  abdominal pain, nausea, vomiting.  EXAM: CT ABDOMEN AND PELVIS WITHOUT CONTRAST  TECHNIQUE: Multidetector CT imaging of the abdomen and pelvis was performed following the standard protocol without IV contrast.  COMPARISON:  CT of the abdomen and pelvis on 02/28/2012  FINDINGS: Lower chest: There is mild fibrosis at the lung bases which may be chronic. Atherosclerotic calcification is noted in the coronary vessels. There is a diaphragmatic node measuring 1.0 cm.  Upper abdomen: There is marked intrahepatic and extrahepatic biliary ductal dilatation. The common bile duct measures as large as 2.6 cm. A distal common bile duct hyperdense mass is faintly seen and measures 1.4 cm. This may represent a gallstone or soft tissue mass. The patient has had previous cholecystectomy. Given the lack of intravenous contrast, it would be difficult to exclude a liver lesion but none are seen. A probable hyperdense cyst is identified involving the midpole region right kidney and measures 1.6 cm. The left kidney has a normal appearance. The spleen has a normal appearance. Adrenal glands are unremarkable in appearance. No focal abnormality identified within the pancreas.  Bowel: The stomach and small bowel loops are normal in appearance. There is moderate  sigmoid diverticulosis. Significant stool is identified throughout the colon. The appendix is well seen and has a normal appearance.  Pelvis: The uterus is surgically absent. The urinary bladder is distended. Suspect right adnexal mass/cyst measuring 1.7 cm. Left adnexal region has a normal appearance. There is no free pelvic fluid.  Retroperitoneum: There is atherosclerotic calcification of the aorta. No retroperitoneal or mesenteric adenopathy.  Abdominal wall: Unremarkable.  Osseous structures: Significant hardware from prior spinal fusion. No suspicious lytic or blastic lesions.  IMPRESSION: 1. Marked biliary dilatation to the level of the distal common bile duct. Faint hyperdense mass measuring 1.4 cm seen in the distal common bile duct. Considerations include a stone or tumor. Further evaluation is warranted. Consider MRI/MRCP if the patient is able to tolerate MRI. Otherwise, consider ultrasound of the abdomen. 2. Probable right renal cyst. 3. Sigmoid diverticulosis. 4. Suspect right adnexal mass/cyst measuring 1.7 cm. This has benign characteristics. No further evaluation is felt to be necessary. This recommendation follows ACR consensus guidelines: White Paper of the ACR Incidental Findings Committee II on Adnexal Findings. J Am Coll Radiol [id number].   Electronically Signed   By: Shon Hale M.D.   On: 08/23/2014 16:54   Dg Abd 1 View  09/05/2014   CLINICAL DATA:  Biliary stent.  EXAM: ABDOMEN - 1 VIEW  COMPARISON:  September 04, 2014.  FINDINGS: Status post posterior fusion of lower thoracic and lumbar spine. Status post cholecystectomy. Biliary stent is unchanged in position along the right side of the abdomen most likely within the cecum. Mildly dilated small bowel loops are again noted and unchanged suggesting possible ileus. Stool is noted in the rectum.  IMPRESSION: Biliary stent is unchanged in position within the cecum. Stable mild small bowel dilatation is noted most consistent with  ileus.   Electronically Signed   By: Sabino Dick M.D.   On: 09/05/2014 08:53   Ct Head Wo Contrast  08/23/2014   CLINICAL DATA:  78 year old female with fall, weakness, head and neck injury.  EXAM: CT HEAD WITHOUT CONTRAST  CT CERVICAL SPINE WITHOUT CONTRAST  TECHNIQUE: Multidetector CT imaging of the head and cervical spine was performed following the standard protocol without intravenous contrast. Multiplanar CT image reconstructions of the cervical spine were also generated.  COMPARISON:  03/28/2012 head CT  FINDINGS: CT HEAD  FINDINGS  Minimal chronic small-vessel white matter ischemic changes again noted.  No acute intracranial abnormalities are identified, including mass lesion or mass effect, hydrocephalus, extra-axial fluid collection, midline shift, hemorrhage, or acute infarction. The visualized bony calvarium is unremarkable.  CT CERVICAL SPINE FINDINGS  There is no evidence of acute fracture or subluxation or prevertebral soft tissue swelling.  Multilevel degenerative disc disease, spondylosis and facet arthropathy identified with moderate degenerative disc disease from C5-C7.  The soft tissue structures are unremarkable.  IMPRESSION: No evidence of acute intracranial abnormality or static evidence of acute injury to the cervical spine.  Degenerative changes within the cervical spine.   Electronically Signed   By: Hassan Rowan M.D.   On: 08/23/2014 16:47   Ct Cervical Spine Wo Contrast  08/23/2014   CLINICAL DATA:  78 year old female with fall, weakness, head and neck injury.  EXAM: CT HEAD WITHOUT CONTRAST  CT CERVICAL SPINE WITHOUT CONTRAST  TECHNIQUE: Multidetector CT imaging of the head and cervical spine was performed following the standard protocol without intravenous contrast. Multiplanar CT image reconstructions of the cervical spine were also generated.  COMPARISON:  03/28/2012 head CT  FINDINGS: CT HEAD FINDINGS  Minimal chronic small-vessel white matter ischemic changes again noted.  No  acute intracranial abnormalities are identified, including mass lesion or mass effect, hydrocephalus, extra-axial fluid collection, midline shift, hemorrhage, or acute infarction. The visualized bony calvarium is unremarkable.  CT CERVICAL SPINE FINDINGS  There is no evidence of acute fracture or subluxation or prevertebral soft tissue swelling.  Multilevel degenerative disc disease, spondylosis and facet arthropathy identified with moderate degenerative disc disease from C5-C7.  The soft tissue structures are unremarkable.  IMPRESSION: No evidence of acute intracranial abnormality or static evidence of acute injury to the cervical spine.  Degenerative changes within the cervical spine.   Electronically Signed   By: Hassan Rowan M.D.   On: 08/23/2014 16:47   US Abdomen Complete  08/23/2014   CLINICAL DATA:  78 year old female with jaundice. History of cholecystectomy.  EXAM: ULTRASOUND ABDOMEN COMPLETE  COMPARISON:  02/28/2012 CT  FINDINGS: Gallbladder:  Gallbladder is not visualized compatible with cholecystectomy.  Common bile duct:  Diameter: 18.6 mm. Intrahepatic biliary dilatation is identified. An obstructing cause is not identified on this study but the mid and distal CBD are not well visualized.  Liver:  No focal lesion identified. Within normal limits in parenchymal echogenicity.  IVC:  No abnormality visualized.  Pancreas:  The head and tail of the pancreas are difficult to visualize. Pancreatic ductal dilatation is noted.  Spleen:  Size and appearance within normal limits.  Right Kidney:  Length: 12.1 cm. Echogenicity is upper limits of normal. No mass or hydronephrosis visualized. Multiple cysts are identified.  Left Kidney:  Length: 11.7 cm. Echogenicity is upper limits of normal. No mass or hydronephrosis visualized.  Abdominal aorta:  No aneurysm visualized.  Other findings:  None.  IMPRESSION: CBD and intrahepatic biliary dilatation and pancreatic ductal dilatation without identifiable obstructing  cause on this study. The mid and distal CBD as well as the pancreatic head are not well visualized. Recommend further evaluation.  Upper limits of normal renal echogenicity which may be seen with medical renal disease.  Cholecystectomy.   Electronically Signed   By: Hassan Rowan M.D.   On: 08/23/2014 16:26   Mr 3d Recon At Scanner  08/26/2014   CLINICAL DATA:  Evaluate for biliary tract obstruction.  EXAM: MRI ABDOMEN WITHOUT AND WITH CONTRAST (INCLUDING MRCP)  TECHNIQUE: Multiplanar  multisequence MR imaging of the abdomen was performed both before and after the administration of intravenous contrast. Heavily T2-weighted images of the biliary and pancreatic ducts were obtained, and three-dimensional MRCP images were rendered by post processing.  CONTRAST:  59mL MULTIHANCE GADOBENATE DIMEGLUMINE 529 MG/ML IV SOLN  COMPARISON:  No priors. CT of the abdomen and pelvis without contrast 08/23/2014.  FINDINGS: Patient is status post cholecystectomy. MRCP images demonstrates moderate intra and extrahepatic biliary ductal dilatation. Common bile duct measures up to 10 mm distally, before it abruptly tapers to approximately 1 mm immediately adjacent to the level of the ampulla. This narrowing spans a length of approximately 1.4 cm, and there is mild soft tissue prominence around this narrowed area of the duct, which demonstrates very subtle differential enhancement compared to the remainder of the pancreatic parenchyma. While there is no definite pancreatic head mass, the possibility of a tiny ampullary lesion is difficult to entirely exclude; however, these findings are favored to reflect a distal ductal stricture. Additionally, the pancreatic duct is not dilated. On image 31 of series 5 there is a tiny filling defect in the distal common bile duct, however, this is favored to be related to flow as there are no filling defects in this region on any of the other pulse sequences (including MRCP images) to suggest underlying  ductal stone.  No suspicious hepatic lesion noted. There is slight heterogeneous signal intensity in a body and tail of the pancreas on T2 weighted images, with a small amount of fluid in the adjacent retroperitoneal regions, suggesting mild or resolving pancreatitis. This amorphous retroperitoneal fluid extends into the paracolic gutters bilaterally. No discrete peripancreatic fluid collections are noted at this time. The appearance of the spleen and bilateral adrenal glands is unremarkable. Multiple renal lesions are noted which are low T1 signal intensity, high T2 signal intensity, and do not enhance, compatible with simple cysts, largest of which measure up to 1.5 cm in diameter in the interpolar region of the right kidney.  Orthopedic fixation hardware in the visualized thoracolumbar spine resulting in artifact throughout this region. Small right pleural effusion layering dependently.  IMPRESSION: 1. The common bile duct is dilated to approximately 10 mm and there is moderate intrahepatic biliary ductal dilatation, indicative of biliary tract obstruction. This is associated with a long segment of narrowing over approximately 1.4 cm in the distal common bile duct immediately before the ampulla where the duct tapers to approximately 1 mm in diameter. In the region of ductal narrowing there is some prominence of the surrounding soft tissues, which demonstrate very subtle differential enhancement. It is difficult to discern whether or not this is simply a stricture, or if there is a small circumferential ampullary lesion in this region. Correlation with endoscopic ultrasound, ERCP and potential biopsy is recommended. 2. The appearance of the pancreas suggest either mild or resolving pancreatitis. No discrete peripancreatic fluid collection to suggest pseudocyst at this time. 3. Status post cholecystectomy. 4. Additional incidental findings, as above.   Electronically Signed   By: Vinnie Langton M.D.   On:  08/26/2014 08:41   US Abdomen Limited  09/04/2014   CLINICAL DATA:  Right upper quadrant pain. History of cholecystectomy and common bile duct stent.  EXAM: US ABDOMEN LIMITED - RIGHT UPPER QUADRANT  COMPARISON:  ERCP 08/26/2014 and MRCP 08/25/2014. Ultrasound abdomen 08/23/2014.  FINDINGS: Gallbladder:  Surgically absent.  Common bile duct:  Diameter: Measures up to 12 mm (previously 10 mm on 08/25/2014).  Liver:  No focal  lesion identified. Within normal limits in parenchymal echogenicity.  IMPRESSION: Common bile duct is stable to minimally more prominent than on 08/25/2014. Patient is status post cholecystectomy.   Electronically Signed   By: Lorin Picket M.D.   On: 09/04/2014 14:49   Dg Chest Port 1 View  08/23/2014   CLINICAL DATA:  SOB.  Weakness, jaundice, hypertension.  EXAM: PORTABLE CHEST - 1 VIEW  COMPARISON:  04/28/2007  FINDINGS: Shallow lung inflation. The heart is enlarged. There is perihilar peribronchial thickening. No focal consolidations or pleural effusions. No pulmonary edema. The patient has had previous left clavicle ORIF an left shoulder arthroplasty. Patient has had posterior thoracolumbar fusion. Surgical clips are noted in the right upper quadrant of the abdomen.  IMPRESSION: 1. Shallow inflation. 2. Bronchitic changes. 3.  No focal acute pulmonary abnormality.   Electronically Signed   By: Shon Hale M.D.   On: 08/23/2014 13:32   Dg Ercp Biliary & Pancreatic Ducts  08/26/2014   CLINICAL DATA:  Biliary obstruction.  EXAM: ERCP  TECHNIQUE: Multiple spot images obtained with the fluoroscopic device and submitted for interpretation post-procedure.  COMPARISON:  None.  FINDINGS: Images demonstrate cannulation of the common bile duct, contrast filling a dilated biliary tree, and placement of a stent across the ampulla of. Balloon stone extraction is also noted.  IMPRESSION: ERCP.  These images were submitted for radiologic interpretation only. Please see the procedural report for  the amount of contrast and the fluoroscopy time utilized.   Electronically Signed   By: Maryclare Bean M.D.   On: 08/26/2014 12:50   Dg Abd 2 Views  09/06/2014   CLINICAL DATA:  Reassess stent location.  EXAM: ABDOMEN - 2 VIEW  COMPARISON:  Abdominal films of September 05, 2014.  FINDINGS: The biliary stent tube. Projects over the upper aspect of the right iliac crest likely in the cecum. The bowel gas pattern suggests a mild small bowel ileusbstruction. Extensive postfusion changes of the lumbar spine are present. There surgical clips in the gallbladder fossa.  IMPRESSION: The biliary structure remains in the right lower quadrant of the abdomen. The small bowel gas pattern suggests a mild ileus but no significant obstructive findings are demonstrated.   Electronically Signed   By: David  Martinique   On: 09/06/2014 07:52   Dg Abd Acute W/chest  09/04/2014   CLINICAL DATA:  78 year old female with abdominal pain and vomiting. Recent bowel obstruction  EXAM: ACUTE ABDOMEN SERIES (ABDOMEN 2 VIEW & CHEST 1 VIEW)  COMPARISON:  CT abdomen pelvis 08/23/2014, chest x-ray 08/23/2014 new  FINDINGS: Chest:  Low lung volumes persist, which accentuates the interstitium. Interlobular septal thickening, with some indistinctness around the central pulmonary vasculature. No pneumothorax or pleural effusion. No confluent airspace disease.  Surgical changes of prior reverse left shoulder hemi arthroplasty, as well as plate and screw fixation of prior left clavicle fracture.  Surgical changes of prior cholecystectomy and spinal fixation.  Abdomen:  Multiple borderline dilated small bowel loops. There is a relative paucity of colonic gas. Formed stool within the rectum.  Left decubitus image demonstrates no evidence of layering free air.  The biliary stent projects over the right colon, having migrated from its previous placement on 08/26/2014.  No displaced fracture.  IMPRESSION: Evidence of developing interstitial pulmonary edema.  No  evidence of free air, however, multiple borderline dilated small bowel loops suggest ileus and/or developing small bowel obstruction. Further imaging is recommended, such as serial plain film until resolution of the bowel gas  pattern, or alternatively CT to evaluate for acute abnormality.  Migration of previous biliary stent, potentially within the small bowel or right colon.  Previous surgical changes, as above.  Signed,  Dulcy Fanny. Earleen Newport, DO  [facility]  [facility]   Electronically Signed   By: Corrie Mckusick O.D.   On: 09/04/2014 15:53   Mr Abd W/wo Cm/mrcp  08/26/2014   CLINICAL DATA:  Evaluate for biliary tract obstruction.  EXAM: MRI ABDOMEN WITHOUT AND WITH CONTRAST (INCLUDING MRCP)  TECHNIQUE: Multiplanar multisequence MR imaging of the abdomen was performed both before and after the administration of intravenous contrast. Heavily T2-weighted images of the biliary and pancreatic ducts were obtained, and three-dimensional MRCP images were rendered by post processing.  CONTRAST:  50mL MULTIHANCE GADOBENATE DIMEGLUMINE 529 MG/ML IV SOLN  COMPARISON:  No priors. CT of the abdomen and pelvis without contrast 08/23/2014.  FINDINGS: Patient is status post cholecystectomy. MRCP images demonstrates moderate intra and extrahepatic biliary ductal dilatation. Common bile duct measures up to 10 mm distally, before it abruptly tapers to approximately 1 mm immediately adjacent to the level of the ampulla. This narrowing spans a length of approximately 1.4 cm, and there is mild soft tissue prominence around this narrowed area of the duct, which demonstrates very subtle differential enhancement compared to the remainder of the pancreatic parenchyma. While there is no definite pancreatic head mass, the possibility of a tiny ampullary lesion is difficult to entirely exclude; however, these findings are favored to reflect a distal ductal stricture. Additionally, the pancreatic  duct is not dilated. On image 31 of series 5 there is a tiny filling defect in the distal common bile duct, however, this is favored to be related to flow as there are no filling defects in this region on any of the other pulse sequences (including MRCP images) to suggest underlying ductal stone.  No suspicious hepatic lesion noted. There is slight heterogeneous signal intensity in a body and tail of the pancreas on T2 weighted images, with a small amount of fluid in the adjacent retroperitoneal regions, suggesting mild or resolving pancreatitis. This amorphous retroperitoneal fluid extends into the paracolic gutters bilaterally. No discrete peripancreatic fluid collections are noted at this time. The appearance of the spleen and bilateral adrenal glands is unremarkable. Multiple renal lesions are noted which are low T1 signal intensity, high T2 signal intensity, and do not enhance, compatible with simple cysts, largest of which measure up to 1.5 cm in diameter in the interpolar region of the right kidney.  Orthopedic fixation hardware in the visualized thoracolumbar spine resulting in artifact throughout this region. Small right pleural effusion layering dependently.  IMPRESSION: 1. The common bile duct is dilated to approximately 10 mm and there is moderate intrahepatic biliary ductal dilatation, indicative of biliary tract obstruction. This is associated with a long segment of narrowing over approximately 1.4 cm in the distal common bile duct immediately before the ampulla where the duct tapers to approximately 1 mm in diameter. In the region of ductal narrowing there is some prominence of the surrounding soft tissues, which demonstrate very subtle differential enhancement. It is difficult to discern whether or not this is simply a stricture, or if there is a small circumferential ampullary lesion in this region. Correlation with endoscopic ultrasound, ERCP and potential biopsy is recommended. 2. The appearance  of the pancreas suggest either mild or resolving pancreatitis. No discrete peripancreatic fluid collection to suggest pseudocyst at this time. 3. Status post cholecystectomy. 4.  Additional incidental findings, as above.   Electronically Signed   By: Vinnie Langton M.D.   On: 08/26/2014 08:41    Oren Binet, MD  [facility] Pager:[phone]  If 7PM-7AM, please contact night-coverage [email or web address] Password [email or web address] 09/06/2014, 2:52 PM   LOS: 2 days   **Disclaimer: This note may have been dictated with voice recognition software. Similar sounding words can inadvertently be transcribed and this note may contain transcription errors which may not have been corrected upon publication of note.**

## 2014-09-07 ENCOUNTER — Inpatient Hospital Stay (HOSPITAL_COMMUNITY): Payer: Medicare Other

## 2014-09-07 LAB — COMPREHENSIVE METABOLIC PANEL
ALBUMIN: 3.1 g/dL — AB (ref 3.5–5.2)
ALT: 13 U/L (ref 0–35)
AST: 25 U/L (ref 0–37)
Alkaline Phosphatase: 190 U/L — ABNORMAL HIGH (ref 39–117)
Anion gap: 16 — ABNORMAL HIGH (ref 5–15)
BILIRUBIN TOTAL: 2.1 mg/dL — AB (ref 0.3–1.2)
BUN: 14 mg/dL (ref 6–23)
CHLORIDE: 102 meq/L (ref 96–112)
CO2: 24 meq/L (ref 19–32)
Calcium: 9.6 mg/dL (ref 8.4–10.5)
Creatinine, Ser: 0.79 mg/dL (ref 0.50–1.10)
GFR calc Af Amer: >90 mL/min (ref >90)
GFR calc non Af Amer: 79 mL/min — ABNORMAL LOW (ref >90)
Glucose, Bld: 137 mg/dL — ABNORMAL HIGH (ref 70–99)
POTASSIUM: 3.8 meq/L (ref 3.7–5.3)
SODIUM: 142 meq/L (ref 137–147)
Total Protein: 7.2 g/dL (ref 6.0–8.3)

## 2014-09-07 MED ORDER — LORAZEPAM 2 MG/ML IJ SOLN: 0.5000 mg | Freq: Once | INTRAMUSCULAR | Status: DC

## 2014-09-07 MED ORDER — POLYETHYLENE GLYCOL 3350 17 GM/SCOOP PO POWD
255.0000 g | ORAL | Status: DC
  Administered 2014-09-07: 255 g via ORAL
  Filled 2014-09-07: qty 255

## 2014-09-07 MED ORDER — POLYETHYLENE GLYCOL 3350 17 GM/SCOOP PO POWD
1.0000 | Freq: Once | ORAL | Status: AC
Start: 2014-09-07 — End: 2014-09-07
  Administered 2014-09-07: 255 g via ORAL
  Filled 2014-09-07: qty 255

## 2014-09-07 NOTE — Progress Notes (Signed)
Subjective: Feels nauseated. Didn't take much of the Miralax prep, didn't like taste. Has had several bowel movements, but still has solid fecal matter, which I personally viewed in the toilet.  Objective: Vital signs in last 24 hours: Temp:  [98.5 F (36.9 C)-99.3 F (37.4 C)] 98.6 F (37 C) (09/26 0415) Pulse Rate:  [63-74] 63 (09/26 0415) Resp:  [19-20] 19 (09/26 0415) BP: (149-162)/(61-78) 162/78 mmHg (09/26 0415) SpO2:  [96 %-98 %] 98 % (09/26 0415) Weight:  [58.605 kg (129 lb 3.2 oz)] 58.605 kg (129 lb 3.2 oz) (09/26 0415) Weight change:  Last BM Date: 09/06/14  PE: GEN:  Alert, NAD ABD:  Soft, non-tender.  Lab Results: CBC    Component Value Date/Time   WBC 10.4 09/06/2014 0929   RBC 3.59* 09/06/2014 0929   HGB 10.7* 09/06/2014 0929   HCT 33.3* 09/06/2014 0929   PLT 305 09/06/2014 0929   MCV 92.8 09/06/2014 0929   MCH 29.8 09/06/2014 0929   MCHC 32.1 09/06/2014 0929   RDW 14.7 09/06/2014 0929   LYMPHSABS 0.9 09/06/2014 0929   MONOABS 0.9 09/06/2014 0929   EOSABS 0.1 09/06/2014 0929   BASOSABS 0.1 09/06/2014 0929   CMP     Component Value Date/Time   NA 142 09/07/2014 0642   K 3.8 09/07/2014 0642   CL 102 09/07/2014 0642   CO2 24 09/07/2014 0642   GLUCOSE 137* 09/07/2014 0642   BUN 14 09/07/2014 0642   CREATININE 0.79 09/07/2014 0642   CALCIUM 9.6 09/07/2014 0642   PROT 7.2 09/07/2014 0642   ALBUMIN 3.1* 09/07/2014 0642   AST 25 09/07/2014 0642   ALT 13 09/07/2014 0642   ALKPHOS 190* 09/07/2014 0642   BILITOT 2.1* 09/07/2014 0642   GFRNONAA 79* 09/07/2014 0642   GFRAA >90 09/07/2014 9629   Studies/Results: Abd xray 09/07/14, personally reviewed by me, stent appears to have migrated a few centimeters more distally but is still in the right lower quadrant  Assessment:  1.  Migrated biliary stent.  Now in the region of the cecum. 2.  Partial small bowel obstruction, likely from stent, symptoms are improving in this regard. 3.  Distal bile duct stricture, unclear significance,  LFTs downtrending despite stent passage.  Plan:  1.  I had a long discussion with both the patient and her family.  I feel it is safe to wait and see if this stent passes spontaneously after continued laxative therapy.  However, I understand and respect that patient and her husband want to go ahead and have the stent retrieved.  I have instructed patient that she will need to have further bowel purgatives in order to cleanse the colon in turn so that I can see and safely make my way around her colon in order to both visualized and safely retrieve the stent.  2.  Patient is willing to retry Miralax, but does not want it to be with Gatorade; she wants it to be mixed in some other sort of liquid, which is fine by me. 3.  Will repeat abdominal xray early in the morning; if no significant stent passage, will try to do colonoscopy in the morning.  Patient tells me that she did not tolerate her colonoscopy a few years ago very well and tells me she needs anesthesia for the procedure.  I will see what I can work out.  However, I did tell her that if anesthesia isn't available or is backed up by emergency surgeries, her colonoscopy may have to be  delayed, or she will need to be willing to give moderate sedation another shot. 4.  Patient and husband understand and are in agreement with current course of management.   [name] [name] 09/07/2014, 11:19 AM

## 2014-09-07 NOTE — Progress Notes (Signed)
PATIENT DETAILS Name: [name] [name] Age: 78 y.o. Sex: female Date of Birth: 1936-08-31 Admit Date: 09/04/2014 Admitting Physician Domenic Polite, MD [id number] [name], MD  Subjective: Numerous bowel movements overnight. No major issues.  Assessment/Plan: Principal Problem: Mild ileus with nausea/vomiting - Likely secondary to dislodged biliary stent - Admitted and given laxatives, stent still in place, seen on x-ray of the abdomen today as well. - Seen by gastroenterology, plans are to continue with laxatives- repeat x-ray in a.m.-if stent still in place, and GI planning a colonoscopy tomorrow.  Biliary stricture with migrated stent  -Bile duct brushings with Atypical cells, no malignancy noted on Path  -KuB on 9/24 shows no further migration of stent overnight. See above for further details - Per GI note-US with possible biliary endoscopy/restenting scheduled for this coming Wednesday as outpatient. LFTs show decreasing bilirubin and alkaline phosphatase levels. Patient afebrile and without any abdominal pain this morning   Leukocytosis  -Unclear etiology- suspect secondary to stress margination - Continue to monitor off antibiotics- remains on Flagyl for recent C. difficile colitis. Remains afebrile and nontoxic looking. No clinical signs/symptoms of cholangitis at this time .  Acute Kidney Injury  - Likely pre-renal due to N/V and diuretics.  - Resolved with IV fluids  Mild Interstitial Pulm edema  -Asymptomatic,Will discontinue IV lasix, and saline lock fluids.  -Echo completed - shows grade 1 diastolic dysfunction with no wall motion abnormalities. EF 60-65%  -Follow I/Os, weights   Hypertension  - Controlled-Continue home regimen, except HCTZ & lasix   GERD (gastroesophageal reflux disease)  - Discontinue PPI-place on Pepcid given  C. difficile colitis  Anxiety  -Continue anxiolytics PRN   Protein-calorie malnutrition, severe  -To be addressed when  N/V and ileus improves   Clostridium difficile colitis  -No diarrhea at this time. Rather constipation.  -Complete Flagyl regimen, last day 9/28, based on DC summary   History of depression - Resume Pristiq  Hypothyroidism - Continue with levothyroxine  Disposition: Remain inpatient  DVT Prophylaxis: Prophylactic Lovenox   Code Status: Full code   Family Communication Spouse at bedside  Procedures:  None  CONSULTS:  GI  MEDICATIONS: Scheduled Meds: . amLODipine  10 mg Oral q morning - 10a  . atenolol  50 mg Oral q morning - 10a  . docusate sodium  100 mg Oral Daily  . enoxaparin (LOVENOX) injection  40 mg Subcutaneous Q24H  . famotidine  20 mg Oral Daily  . feeding supplement (RESOURCE BREEZE)  1 Container Oral TID BM  . fenofibrate  160 mg Oral Daily  . levothyroxine  100 mcg Oral QAC breakfast  . metroNIDAZOLE  500 mg Oral 3 times per day  . multivitamin with minerals  1 tablet Oral Daily  . potassium chloride SA  20 mEq Oral BID  . sodium phosphate  1 enema Rectal Once  . venlafaxine XR  75 mg Oral Q breakfast   Continuous Infusions:  PRN Meds:.acetaminophen, acetaminophen, ALPRAZolam, gabapentin, HYDROmorphone (DILAUDID) injection, ondansetron (ZOFRAN) IV, ondansetron, promethazine, zolpidem  Antibiotics: Anti-infectives   Start     Dose/Rate Route Frequency Ordered Stop   09/04/14 1715  metroNIDAZOLE (FLAGYL) tablet 500 mg     500 mg Oral 3 times per day 09/04/14 1707         PHYSICAL EXAM: Vital signs in last 24 hours: Filed Vitals:   09/06/14 1001 09/06/14 1325 09/06/14 2110 09/07/14 0415  BP: 140/76 149/61 158/77 162/78  Pulse: 80  74 66 63  Temp:  99.3 F (37.4 C) 98.5 F (36.9 C) 98.6 F (37 C)  TempSrc:  Oral Oral Oral  Resp:  20 20 19   Height:      Weight:    58.605 kg (129 lb 3.2 oz)  SpO2:  96% 96% 98%    Weight change:  Filed Weights   09/04/14 1951 09/07/14 0415  Weight: 61.5 kg (135 lb 9.3 oz) 58.605 kg (129 lb 3.2 oz)    Body mass index is 24.42 kg/(m^2).   Gen Exam: Awake and alert with clear speech.   Neck: Supple, No JVD.   Chest: B/L Clear.   CVS: S1 S2 Regular, no murmurs.  Abdomen: soft, BS +, non tender, non distended.  Extremities: no edema, lower extremities warm to touch. Neurologic: Non Focal.   Skin: No Rash.   Wounds: N/A.   Intake/Output from previous day:  Intake/Output Summary (Last 24 hours) at 09/07/14 1419 Last data filed at 09/07/14 0704  Gross per 24 hour  Intake    400 ml  Output    200 ml  Net    200 ml     LAB RESULTS: CBC  Recent Labs Lab 09/04/14 1037 09/05/14 0657 09/05/14 1235 09/06/14 0929  WBC 14.6* 19.6* 16.7* 10.4  HGB 13.3 10.8* 10.6* 10.7*  HCT 41.3 33.7* 32.7* 33.3*  PLT 446* 323 319 305  MCV 92.8 95.7 92.6 92.8  MCH 29.9 30.7 30.0 29.8  MCHC 32.2 32.0 32.4 32.1  RDW 15.2 15.3 15.0 14.7  LYMPHSABS 1.5  --  1.4 0.9  MONOABS 0.6  --  1.5* 0.9  EOSABS 0.1  --  0.1 0.1  BASOSABS 0.0  --  0.1 0.1    Chemistries   Recent Labs Lab 09/04/14 1037 09/05/14 0657 09/06/14 0929 09/07/14 0642  NA 141 136* 139 142  K 4.1 3.8 4.0 3.8  CL 101 97 101 102  CO2 23 24 23 24   GLUCOSE 186* 111* 138* 137*  BUN 19 25* 16 14  CREATININE 0.83 1.19* 0.79 0.79  CALCIUM 9.9 8.7 9.2 9.6    CBG: No results found for this basename: GLUCAP,  in the last 168 hours  GFR Estimated Creatinine Clearance: 50 ml/min (by C-G formula based on Cr of 0.79).  Coagulation profile  Recent Labs Lab 09/04/14 1153  INR 1.02    Cardiac Enzymes  Recent Labs Lab 09/04/14 1426  TROPONINI <0.30    No components found with this basename: POCBNP,  No results found for this basename: DDIMER,  in the last 72 hours No results found for this basename: HGBA1C,  in the last 72 hours No results found for this basename: CHOL, HDL, LDLCALC, TRIG, CHOLHDL, LDLDIRECT,  in the last 72 hours No results found for this basename: TSH, T4TOTAL, FREET3, T3FREE, THYROIDAB,  in the  last 72 hours No results found for this basename: VITAMINB12, FOLATE, FERRITIN, TIBC, IRON, RETICCTPCT,  in the last 72 hours  Recent Labs  09/04/14 1426  LIPASE 44    Urine Studies No results found for this basename: UACOL, UAPR, USPG, UPH, UTP, UGL, UKET, UBIL, UHGB, UNIT, UROB, ULEU, UEPI, UWBC, URBC, UBAC, CAST, CRYS, UCOM, BILUA,  in the last 72 hours  MICROBIOLOGY: No results found for this or any previous visit (from the past 240 hour(s)).  RADIOLOGY STUDIES/RESULTS: Ct Abdomen Pelvis Wo Contrast  08/23/2014   CLINICAL DATA:  Anorexia. Generalized weakness. Symptoms worse over the last 8 weeks. 15 lb weight loss.  Frequent falls. Jaundice. No abdominal pain, nausea, vomiting.  EXAM: CT ABDOMEN AND PELVIS WITHOUT CONTRAST  TECHNIQUE: Multidetector CT imaging of the abdomen and pelvis was performed following the standard protocol without IV contrast.  COMPARISON:  CT of the abdomen and pelvis on 02/28/2012  FINDINGS: Lower chest: There is mild fibrosis at the lung bases which may be chronic. Atherosclerotic calcification is noted in the coronary vessels. There is a diaphragmatic node measuring 1.0 cm.  Upper abdomen: There is marked intrahepatic and extrahepatic biliary ductal dilatation. The common bile duct measures as large as 2.6 cm. A distal common bile duct hyperdense mass is faintly seen and measures 1.4 cm. This may represent a gallstone or soft tissue mass. The patient has had previous cholecystectomy. Given the lack of intravenous contrast, it would be difficult to exclude a liver lesion but none are seen. A probable hyperdense cyst is identified involving the midpole region right kidney and measures 1.6 cm. The left kidney has a normal appearance. The spleen has a normal appearance. Adrenal glands are unremarkable in appearance. No focal abnormality identified within the pancreas.  Bowel: The stomach and small bowel loops are normal in appearance. There is moderate sigmoid  diverticulosis. Significant stool is identified throughout the colon. The appendix is well seen and has a normal appearance.  Pelvis: The uterus is surgically absent. The urinary bladder is distended. Suspect right adnexal mass/cyst measuring 1.7 cm. Left adnexal region has a normal appearance. There is no free pelvic fluid.  Retroperitoneum: There is atherosclerotic calcification of the aorta. No retroperitoneal or mesenteric adenopathy.  Abdominal wall: Unremarkable.  Osseous structures: Significant hardware from prior spinal fusion. No suspicious lytic or blastic lesions.  IMPRESSION: 1. Marked biliary dilatation to the level of the distal common bile duct. Faint hyperdense mass measuring 1.4 cm seen in the distal common bile duct. Considerations include a stone or tumor. Further evaluation is warranted. Consider MRI/MRCP if the patient is able to tolerate MRI. Otherwise, consider ultrasound of the abdomen. 2. Probable right renal cyst. 3. Sigmoid diverticulosis. 4. Suspect right adnexal mass/cyst measuring 1.7 cm. This has benign characteristics. No further evaluation is felt to be necessary. This recommendation follows ACR consensus guidelines: White Paper of the ACR Incidental Findings Committee II on Adnexal Findings. J Am Coll Radiol [phone].   Electronically Signed   By: Shon Hale M.D.   On: 08/23/2014 16:54   Dg Abd 1 View  09/05/2014   CLINICAL DATA:  Biliary stent.  EXAM: ABDOMEN - 1 VIEW  COMPARISON:  September 04, 2014.  FINDINGS: Status post posterior fusion of lower thoracic and lumbar spine. Status post cholecystectomy. Biliary stent is unchanged in position along the right side of the abdomen most likely within the cecum. Mildly dilated small bowel loops are again noted and unchanged suggesting possible ileus. Stool is noted in the rectum.  IMPRESSION: Biliary stent is unchanged in position within the cecum. Stable mild small bowel dilatation is noted most consistent with ileus.    Electronically Signed   By: Sabino Dick M.D.   On: 09/05/2014 08:53   Ct Head Wo Contrast  08/23/2014   CLINICAL DATA:  78 year old female with fall, weakness, head and neck injury.  EXAM: CT HEAD WITHOUT CONTRAST  CT CERVICAL SPINE WITHOUT CONTRAST  TECHNIQUE: Multidetector CT imaging of the head and cervical spine was performed following the standard protocol without intravenous contrast. Multiplanar CT image reconstructions of the cervical spine were also generated.  COMPARISON:  03/28/2012 head CT  FINDINGS: CT HEAD FINDINGS  Minimal chronic small-vessel white matter ischemic changes again noted.  No acute intracranial abnormalities are identified, including mass lesion or mass effect, hydrocephalus, extra-axial fluid collection, midline shift, hemorrhage, or acute infarction. The visualized bony calvarium is unremarkable.  CT CERVICAL SPINE FINDINGS  There is no evidence of acute fracture or subluxation or prevertebral soft tissue swelling.  Multilevel degenerative disc disease, spondylosis and facet arthropathy identified with moderate degenerative disc disease from C5-C7.  The soft tissue structures are unremarkable.  IMPRESSION: No evidence of acute intracranial abnormality or static evidence of acute injury to the cervical spine.  Degenerative changes within the cervical spine.   Electronically Signed   By: Hassan Rowan M.D.   On: 08/23/2014 16:47   Ct Cervical Spine Wo Contrast  08/23/2014   CLINICAL DATA:  78 year old female with fall, weakness, head and neck injury.  EXAM: CT HEAD WITHOUT CONTRAST  CT CERVICAL SPINE WITHOUT CONTRAST  TECHNIQUE: Multidetector CT imaging of the head and cervical spine was performed following the standard protocol without intravenous contrast. Multiplanar CT image reconstructions of the cervical spine were also generated.  COMPARISON:  03/28/2012 head CT  FINDINGS: CT HEAD FINDINGS  Minimal chronic small-vessel white matter ischemic changes again noted.  No acute  intracranial abnormalities are identified, including mass lesion or mass effect, hydrocephalus, extra-axial fluid collection, midline shift, hemorrhage, or acute infarction. The visualized bony calvarium is unremarkable.  CT CERVICAL SPINE FINDINGS  There is no evidence of acute fracture or subluxation or prevertebral soft tissue swelling.  Multilevel degenerative disc disease, spondylosis and facet arthropathy identified with moderate degenerative disc disease from C5-C7.  The soft tissue structures are unremarkable.  IMPRESSION: No evidence of acute intracranial abnormality or static evidence of acute injury to the cervical spine.  Degenerative changes within the cervical spine.   Electronically Signed   By: Hassan Rowan M.D.   On: 08/23/2014 16:47   US Abdomen Complete  08/23/2014   CLINICAL DATA:  78 year old female with jaundice. History of cholecystectomy.  EXAM: ULTRASOUND ABDOMEN COMPLETE  COMPARISON:  02/28/2012 CT  FINDINGS: Gallbladder:  Gallbladder is not visualized compatible with cholecystectomy.  Common bile duct:  Diameter: 18.6 mm. Intrahepatic biliary dilatation is identified. An obstructing cause is not identified on this study but the mid and distal CBD are not well visualized.  Liver:  No focal lesion identified. Within normal limits in parenchymal echogenicity.  IVC:  No abnormality visualized.  Pancreas:  The head and tail of the pancreas are difficult to visualize. Pancreatic ductal dilatation is noted.  Spleen:  Size and appearance within normal limits.  Right Kidney:  Length: 12.1 cm. Echogenicity is upper limits of normal. No mass or hydronephrosis visualized. Multiple cysts are identified.  Left Kidney:  Length: 11.7 cm. Echogenicity is upper limits of normal. No mass or hydronephrosis visualized.  Abdominal aorta:  No aneurysm visualized.  Other findings:  None.  IMPRESSION: CBD and intrahepatic biliary dilatation and pancreatic ductal dilatation without identifiable obstructing cause on  this study. The mid and distal CBD as well as the pancreatic head are not well visualized. Recommend further evaluation.  Upper limits of normal renal echogenicity which may be seen with medical renal disease.  Cholecystectomy.   Electronically Signed   By: Hassan Rowan M.D.   On: 08/23/2014 16:26   Mr 3d Recon At Scanner  08/26/2014   CLINICAL DATA:  Evaluate for biliary tract obstruction.  EXAM: MRI ABDOMEN WITHOUT AND WITH CONTRAST (INCLUDING MRCP)  TECHNIQUE: Multiplanar multisequence MR imaging of the abdomen was performed both before and after the administration of intravenous contrast. Heavily T2-weighted images of the biliary and pancreatic ducts were obtained, and three-dimensional MRCP images were rendered by post processing.  CONTRAST:  79mL MULTIHANCE GADOBENATE DIMEGLUMINE 529 MG/ML IV SOLN  COMPARISON:  No priors. CT of the abdomen and pelvis without contrast 08/23/2014.  FINDINGS: Patient is status post cholecystectomy. MRCP images demonstrates moderate intra and extrahepatic biliary ductal dilatation. Common bile duct measures up to 10 mm distally, before it abruptly tapers to approximately 1 mm immediately adjacent to the level of the ampulla. This narrowing spans a length of approximately 1.4 cm, and there is mild soft tissue prominence around this narrowed area of the duct, which demonstrates very subtle differential enhancement compared to the remainder of the pancreatic parenchyma. While there is no definite pancreatic head mass, the possibility of a tiny ampullary lesion is difficult to entirely exclude; however, these findings are favored to reflect a distal ductal stricture. Additionally, the pancreatic duct is not dilated. On image 31 of series 5 there is a tiny filling defect in the distal common bile duct, however, this is favored to be related to flow as there are no filling defects in this region on any of the other pulse sequences (including MRCP images) to suggest underlying ductal  stone.  No suspicious hepatic lesion noted. There is slight heterogeneous signal intensity in a body and tail of the pancreas on T2 weighted images, with a small amount of fluid in the adjacent retroperitoneal regions, suggesting mild or resolving pancreatitis. This amorphous retroperitoneal fluid extends into the paracolic gutters bilaterally. No discrete peripancreatic fluid collections are noted at this time. The appearance of the spleen and bilateral adrenal glands is unremarkable. Multiple renal lesions are noted which are low T1 signal intensity, high T2 signal intensity, and do not enhance, compatible with simple cysts, largest of which measure up to 1.5 cm in diameter in the interpolar region of the right kidney.  Orthopedic fixation hardware in the visualized thoracolumbar spine resulting in artifact throughout this region. Small right pleural effusion layering dependently.  IMPRESSION: 1. The common bile duct is dilated to approximately 10 mm and there is moderate intrahepatic biliary ductal dilatation, indicative of biliary tract obstruction. This is associated with a long segment of narrowing over approximately 1.4 cm in the distal common bile duct immediately before the ampulla where the duct tapers to approximately 1 mm in diameter. In the region of ductal narrowing there is some prominence of the surrounding soft tissues, which demonstrate very subtle differential enhancement. It is difficult to discern whether or not this is simply a stricture, or if there is a small circumferential ampullary lesion in this region. Correlation with endoscopic ultrasound, ERCP and potential biopsy is recommended. 2. The appearance of the pancreas suggest either mild or resolving pancreatitis. No discrete peripancreatic fluid collection to suggest pseudocyst at this time. 3. Status post cholecystectomy. 4. Additional incidental findings, as above.   Electronically Signed   By: Vinnie Langton M.D.   On: 08/26/2014  08:41   US Abdomen Limited  09/04/2014   CLINICAL DATA:  Right upper quadrant pain. History of cholecystectomy and common bile duct stent.  EXAM: US ABDOMEN LIMITED - RIGHT UPPER QUADRANT  COMPARISON:  ERCP 08/26/2014 and MRCP 08/25/2014. Ultrasound abdomen 08/23/2014.  FINDINGS: Gallbladder:  Surgically absent.  Common bile duct:  Diameter: Measures up to 12 mm (previously 10 mm on 08/25/2014).  Liver:  No focal lesion identified. Within normal limits in parenchymal echogenicity.  IMPRESSION: Common bile duct is stable to minimally more prominent than on 08/25/2014. Patient is status post cholecystectomy.   Electronically Signed   By: Lorin Picket M.D.   On: 09/04/2014 14:49   Dg Chest Port 1 View  08/23/2014   CLINICAL DATA:  SOB.  Weakness, jaundice, hypertension.  EXAM: PORTABLE CHEST - 1 VIEW  COMPARISON:  04/28/2007  FINDINGS: Shallow lung inflation. The heart is enlarged. There is perihilar peribronchial thickening. No focal consolidations or pleural effusions. No pulmonary edema. The patient has had previous left clavicle ORIF an left shoulder arthroplasty. Patient has had posterior thoracolumbar fusion. Surgical clips are noted in the right upper quadrant of the abdomen.  IMPRESSION: 1. Shallow inflation. 2. Bronchitic changes. 3.  No focal acute pulmonary abnormality.   Electronically Signed   By: Shon Hale M.D.   On: 08/23/2014 13:32   Dg Ercp Biliary & Pancreatic Ducts  08/26/2014   CLINICAL DATA:  Biliary obstruction.  EXAM: ERCP  TECHNIQUE: Multiple spot images obtained with the fluoroscopic device and submitted for interpretation post-procedure.  COMPARISON:  None.  FINDINGS: Images demonstrate cannulation of the common bile duct, contrast filling a dilated biliary tree, and placement of a stent across the ampulla of. Balloon stone extraction is also noted.  IMPRESSION: ERCP.  These images were submitted for radiologic interpretation only. Please see the procedural report for the amount  of contrast and the fluoroscopy time utilized.   Electronically Signed   By: Maryclare Bean M.D.   On: 08/26/2014 12:50   Dg Abd 2 Views  09/06/2014   CLINICAL DATA:  Reassess stent location.  EXAM: ABDOMEN - 2 VIEW  COMPARISON:  Abdominal films of September 05, 2014.  FINDINGS: The biliary stent tube. Projects over the upper aspect of the right iliac crest likely in the cecum. The bowel gas pattern suggests a mild small bowel ileusbstruction. Extensive postfusion changes of the lumbar spine are present. There surgical clips in the gallbladder fossa.  IMPRESSION: The biliary structure remains in the right lower quadrant of the abdomen. The small bowel gas pattern suggests a mild ileus but no significant obstructive findings are demonstrated.   Electronically Signed   By: David  Martinique   On: 09/06/2014 07:52   Dg Abd Acute W/chest  09/04/2014   CLINICAL DATA:  78 year old female with abdominal pain and vomiting. Recent bowel obstruction  EXAM: ACUTE ABDOMEN SERIES (ABDOMEN 2 VIEW & CHEST 1 VIEW)  COMPARISON:  CT abdomen pelvis 08/23/2014, chest x-ray 08/23/2014 new  FINDINGS: Chest:  Low lung volumes persist, which accentuates the interstitium. Interlobular septal thickening, with some indistinctness around the central pulmonary vasculature. No pneumothorax or pleural effusion. No confluent airspace disease.  Surgical changes of prior reverse left shoulder hemi arthroplasty, as well as plate and screw fixation of prior left clavicle fracture.  Surgical changes of prior cholecystectomy and spinal fixation.  Abdomen:  Multiple borderline dilated small bowel loops. There is a relative paucity of colonic gas. Formed stool within the rectum.  Left decubitus image demonstrates no evidence of layering free air.  The biliary stent projects over the right colon, having migrated from its previous placement on 08/26/2014.  No displaced fracture.  IMPRESSION: Evidence of developing interstitial pulmonary edema.  No evidence of  free air, however, multiple borderline dilated small bowel loops suggest ileus and/or developing small bowel obstruction. Further imaging is recommended, such as serial plain film until resolution of the  bowel gas pattern, or alternatively CT to evaluate for acute abnormality.  Migration of previous biliary stent, potentially within the small bowel or right colon.  Previous surgical changes, as above.  Signed,  Dulcy Fanny. Earleen Newport, DO  [facility]  [facility]   Electronically Signed   By: Corrie Mckusick O.D.   On: 09/04/2014 15:53   Mr Abd W/wo Cm/mrcp  08/26/2014   CLINICAL DATA:  Evaluate for biliary tract obstruction.  EXAM: MRI ABDOMEN WITHOUT AND WITH CONTRAST (INCLUDING MRCP)  TECHNIQUE: Multiplanar multisequence MR imaging of the abdomen was performed both before and after the administration of intravenous contrast. Heavily T2-weighted images of the biliary and pancreatic ducts were obtained, and three-dimensional MRCP images were rendered by post processing.  CONTRAST:  61mL MULTIHANCE GADOBENATE DIMEGLUMINE 529 MG/ML IV SOLN  COMPARISON:  No priors. CT of the abdomen and pelvis without contrast 08/23/2014.  FINDINGS: Patient is status post cholecystectomy. MRCP images demonstrates moderate intra and extrahepatic biliary ductal dilatation. Common bile duct measures up to 10 mm distally, before it abruptly tapers to approximately 1 mm immediately adjacent to the level of the ampulla. This narrowing spans a length of approximately 1.4 cm, and there is mild soft tissue prominence around this narrowed area of the duct, which demonstrates very subtle differential enhancement compared to the remainder of the pancreatic parenchyma. While there is no definite pancreatic head mass, the possibility of a tiny ampullary lesion is difficult to entirely exclude; however, these findings are favored to reflect a distal ductal stricture. Additionally, the pancreatic duct is not  dilated. On image 31 of series 5 there is a tiny filling defect in the distal common bile duct, however, this is favored to be related to flow as there are no filling defects in this region on any of the other pulse sequences (including MRCP images) to suggest underlying ductal stone.  No suspicious hepatic lesion noted. There is slight heterogeneous signal intensity in a body and tail of the pancreas on T2 weighted images, with a small amount of fluid in the adjacent retroperitoneal regions, suggesting mild or resolving pancreatitis. This amorphous retroperitoneal fluid extends into the paracolic gutters bilaterally. No discrete peripancreatic fluid collections are noted at this time. The appearance of the spleen and bilateral adrenal glands is unremarkable. Multiple renal lesions are noted which are low T1 signal intensity, high T2 signal intensity, and do not enhance, compatible with simple cysts, largest of which measure up to 1.5 cm in diameter in the interpolar region of the right kidney.  Orthopedic fixation hardware in the visualized thoracolumbar spine resulting in artifact throughout this region. Small right pleural effusion layering dependently.  IMPRESSION: 1. The common bile duct is dilated to approximately 10 mm and there is moderate intrahepatic biliary ductal dilatation, indicative of biliary tract obstruction. This is associated with a long segment of narrowing over approximately 1.4 cm in the distal common bile duct immediately before the ampulla where the duct tapers to approximately 1 mm in diameter. In the region of ductal narrowing there is some prominence of the surrounding soft tissues, which demonstrate very subtle differential enhancement. It is difficult to discern whether or not this is simply a stricture, or if there is a small circumferential ampullary lesion in this region. Correlation with endoscopic ultrasound, ERCP and potential biopsy is recommended. 2. The appearance of the  pancreas suggest either mild or resolving pancreatitis. No discrete peripancreatic fluid collection to suggest pseudocyst at this time. 3. Status post  cholecystectomy. 4. Additional incidental findings, as above.   Electronically Signed   By: Vinnie Langton M.D.   On: 08/26/2014 08:41    Oren Binet, MD  [facility] Pager:[phone]  If 7PM-7AM, please contact night-coverage [email or web address] Password [email or web address] 09/07/2014, 2:19 PM   LOS: 3 days   **Disclaimer: This note may have been dictated with voice recognition software. Similar sounding words can inadvertently be transcribed and this note may contain transcription errors which may not have been corrected upon publication of note.**

## 2014-09-08 ENCOUNTER — Encounter (HOSPITAL_COMMUNITY)
Admission: EM | Disposition: A | Discharge status: Home / Self Care | Payer: Medicare Other | Source: Home / Self Care | Attending: Internal Medicine

## 2014-09-08 ENCOUNTER — Inpatient Hospital Stay (HOSPITAL_COMMUNITY): Payer: Medicare Other

## 2014-09-08 ENCOUNTER — Encounter (HOSPITAL_COMMUNITY): Payer: Medicare Other | Admitting: Anesthesiology

## 2014-09-08 ENCOUNTER — Encounter (HOSPITAL_COMMUNITY)
Admission: EM | Disposition: A | Discharge status: Home / Self Care | Payer: Self-pay | Source: Home / Self Care | Attending: Internal Medicine

## 2014-09-08 ENCOUNTER — Inpatient Hospital Stay (HOSPITAL_COMMUNITY): Payer: Medicare Other | Admitting: Anesthesiology

## 2014-09-08 HISTORY — PX: COLONOSCOPY: SHX5424

## 2014-09-08 SURGERY — COLONOSCOPY
Anesthesia: Monitor Anesthesia Care | Laterality: Left

## 2014-09-08 SURGERY — COLONOSCOPY
Anesthesia: General

## 2014-09-08 MED ORDER — PROMETHAZINE HCL 25 MG/ML IJ SOLN: 6.2500 mg | INTRAMUSCULAR | Status: DC | PRN

## 2014-09-08 MED ORDER — FENTANYL CITRATE 0.05 MG/ML IJ SOLN: 25.0000 ug | INTRAMUSCULAR | Status: DC | PRN

## 2014-09-08 MED ORDER — MIDAZOLAM HCL 2 MG/2ML IJ SOLN
INTRAMUSCULAR | Status: DC | PRN
  Administered 2014-09-08: 2 mg via INTRAVENOUS

## 2014-09-08 MED ORDER — BOOST / RESOURCE BREEZE PO LIQD: 1.0000 | Freq: Three times a day (TID) | ORAL | Status: DC

## 2014-09-08 MED ORDER — METRONIDAZOLE 500 MG PO TABS: 500.0000 mg | ORAL_TABLET | Freq: Three times a day (TID) | ORAL | Status: DC

## 2014-09-08 MED ORDER — SODIUM CHLORIDE 0.9 % IV SOLN
INTRAVENOUS | Status: DC
  Administered 2014-09-08: 13:00:00 via INTRAVENOUS

## 2014-09-08 MED ORDER — FAMOTIDINE 20 MG PO TABS: 20.0000 mg | ORAL_TABLET | Freq: Every day | ORAL | Status: DC

## 2014-09-08 MED ORDER — LACTATED RINGERS IV SOLN
INTRAVENOUS | Status: DC | PRN
  Administered 2014-09-08: 10:00:00 via INTRAVENOUS

## 2014-09-08 MED ORDER — PROPOFOL 10 MG/ML IV BOLUS
INTRAVENOUS | Status: DC | PRN
  Administered 2014-09-08: 10 mg via INTRAVENOUS
  Administered 2014-09-08 (×2): 20 mg via INTRAVENOUS
  Administered 2014-09-08 (×2): 10 mg via INTRAVENOUS
  Administered 2014-09-08: 20 mg via INTRAVENOUS
  Administered 2014-09-08: 30 mg via INTRAVENOUS
  Administered 2014-09-08: 20 mg via INTRAVENOUS
  Administered 2014-09-08: 30 mg via INTRAVENOUS
  Administered 2014-09-08 (×3): 20 mg via INTRAVENOUS
  Administered 2014-09-08 (×2): 10 mg via INTRAVENOUS

## 2014-09-08 MED ORDER — LORAZEPAM 2 MG/ML IJ SOLN
0.5000 mg | Freq: Once | INTRAMUSCULAR | Status: AC
  Administered 2014-09-08: 0.5 mg via INTRAVENOUS
  Filled 2014-09-08: qty 1

## 2014-09-08 NOTE — H&P (View-Only) (Signed)
PATIENT DETAILS Name: [name] [name] Age: 78 y.o. Sex: female Date of Birth: Dec 10, 1936 Admit Date: 09/04/2014 Admitting Physician Domenic Polite, MD TTS:[id number] [name], MD  Subjective: Numerous bowel movements overnight. No major issues.  Assessment/Plan: Principal Problem: Mild ileus with nausea/vomiting - Likely secondary to dislodged biliary stent - Admitted and given laxatives, stent still in place, seen on x-ray of the abdomen today as well. - Seen by gastroenterology, plans are to continue with laxatives- repeat x-ray in a.m.-if stent still in place, and GI planning a colonoscopy tomorrow.  Biliary stricture with migrated stent  -Bile duct brushings with Atypical cells, no malignancy noted on Path  -KuB on 9/24 shows no further migration of stent overnight. See above for further details - Per GI note-US with possible biliary endoscopy/restenting scheduled for this coming Wednesday as outpatient. LFTs show decreasing bilirubin and alkaline phosphatase levels. Patient afebrile and without any abdominal pain this morning   Leukocytosis  -Unclear etiology- suspect secondary to stress margination - Continue to monitor off antibiotics- remains on Flagyl for recent C. difficile colitis. Remains afebrile and nontoxic looking. No clinical signs/symptoms of cholangitis at this time .  Acute Kidney Injury  - Likely pre-renal due to N/V and diuretics.  - Resolved with IV fluids  Mild Interstitial Pulm edema  -Asymptomatic,Will discontinue IV lasix, and saline lock fluids.  -Echo completed - shows grade 1 diastolic dysfunction with no wall motion abnormalities. EF 60-65%  -Follow I/Os, weights   Hypertension  - Controlled-Continue home regimen, except HCTZ & lasix   GERD (gastroesophageal reflux disease)  - Discontinue PPI-place on Pepcid given  C. difficile colitis  Anxiety  -Continue anxiolytics PRN   Protein-calorie malnutrition, severe  -To be addressed when  N/V and ileus improves   Clostridium difficile colitis  -No diarrhea at this time. Rather constipation.  -Complete Flagyl regimen, last day 9/28, based on DC summary   History of depression - Resume Pristiq  Hypothyroidism - Continue with levothyroxine  Disposition: Remain inpatient  DVT Prophylaxis: Prophylactic Lovenox   Code Status: Full code   Family Communication Spouse at bedside  Procedures:  None  CONSULTS:  GI  MEDICATIONS: Scheduled Meds: . amLODipine  10 mg Oral q morning - 10a  . atenolol  50 mg Oral q morning - 10a  . docusate sodium  100 mg Oral Daily  . enoxaparin (LOVENOX) injection  40 mg Subcutaneous Q24H  . famotidine  20 mg Oral Daily  . feeding supplement (RESOURCE BREEZE)  1 Container Oral TID BM  . fenofibrate  160 mg Oral Daily  . levothyroxine  100 mcg Oral QAC breakfast  . metroNIDAZOLE  500 mg Oral 3 times per day  . multivitamin with minerals  1 tablet Oral Daily  . potassium chloride SA  20 mEq Oral BID  . sodium phosphate  1 enema Rectal Once  . venlafaxine XR  75 mg Oral Q breakfast   Continuous Infusions:  PRN Meds:.acetaminophen, acetaminophen, ALPRAZolam, gabapentin, HYDROmorphone (DILAUDID) injection, ondansetron (ZOFRAN) IV, ondansetron, promethazine, zolpidem  Antibiotics: Anti-infectives   Start     Dose/Rate Route Frequency Ordered Stop   09/04/14 1715  metroNIDAZOLE (FLAGYL) tablet 500 mg     500 mg Oral 3 times per day 09/04/14 1707         PHYSICAL EXAM: Vital signs in last 24 hours: Filed Vitals:   09/06/14 1001 09/06/14 1325 09/06/14 2110 09/07/14 0415  BP: 140/76 149/61 158/77 162/78  Pulse: 80  74 66 63  Temp:  99.3 F (37.4 C) 98.5 F (36.9 C) 98.6 F (37 C)  TempSrc:  Oral Oral Oral  Resp:  20 20 19   Height:      Weight:    58.605 kg (129 lb 3.2 oz)  SpO2:  96% 96% 98%    Weight change:  Filed Weights   09/04/14 1951 09/07/14 0415  Weight: 61.5 kg (135 lb 9.3 oz) 58.605 kg (129 lb 3.2 oz)    Body mass index is 24.42 kg/(m^2).   Gen Exam: Awake and alert with clear speech.   Neck: Supple, No JVD.   Chest: B/L Clear.   CVS: S1 S2 Regular, no murmurs.  Abdomen: soft, BS +, non tender, non distended.  Extremities: no edema, lower extremities warm to touch. Neurologic: Non Focal.   Skin: No Rash.   Wounds: N/A.   Intake/Output from previous day:  Intake/Output Summary (Last 24 hours) at 09/07/14 1419 Last data filed at 09/07/14 0704  Gross per 24 hour  Intake    400 ml  Output    200 ml  Net    200 ml     LAB RESULTS: CBC  Recent Labs Lab 09/04/14 1037 09/05/14 0657 09/05/14 1235 09/06/14 0929  WBC 14.6* 19.6* 16.7* 10.4  HGB 13.3 10.8* 10.6* 10.7*  HCT 41.3 33.7* 32.7* 33.3*  PLT 446* 323 319 305  MCV 92.8 95.7 92.6 92.8  MCH 29.9 30.7 30.0 29.8  MCHC 32.2 32.0 32.4 32.1  RDW 15.2 15.3 15.0 14.7  LYMPHSABS 1.5  --  1.4 0.9  MONOABS 0.6  --  1.5* 0.9  EOSABS 0.1  --  0.1 0.1  BASOSABS 0.0  --  0.1 0.1    Chemistries   Recent Labs Lab 09/04/14 1037 09/05/14 0657 09/06/14 0929 09/07/14 0642  NA 141 136* 139 142  K 4.1 3.8 4.0 3.8  CL 101 97 101 102  CO2 23 24 23 24   GLUCOSE 186* 111* 138* 137*  BUN 19 25* 16 14  CREATININE 0.83 1.19* 0.79 0.79  CALCIUM 9.9 8.7 9.2 9.6    CBG: No results found for this basename: GLUCAP,  in the last 168 hours  GFR Estimated Creatinine Clearance: 50 ml/min (by C-G formula based on Cr of 0.79).  Coagulation profile  Recent Labs Lab 09/04/14 1153  INR 1.02    Cardiac Enzymes  Recent Labs Lab 09/04/14 1426  TROPONINI <0.30    No components found with this basename: POCBNP,  No results found for this basename: DDIMER,  in the last 72 hours No results found for this basename: HGBA1C,  in the last 72 hours No results found for this basename: CHOL, HDL, LDLCALC, TRIG, CHOLHDL, LDLDIRECT,  in the last 72 hours No results found for this basename: TSH, T4TOTAL, FREET3, T3FREE, THYROIDAB,  in the  last 72 hours No results found for this basename: VITAMINB12, FOLATE, FERRITIN, TIBC, IRON, RETICCTPCT,  in the last 72 hours  Recent Labs  09/04/14 1426  LIPASE 44    Urine Studies No results found for this basename: UACOL, UAPR, USPG, UPH, UTP, UGL, UKET, UBIL, UHGB, UNIT, UROB, ULEU, UEPI, UWBC, URBC, UBAC, CAST, CRYS, UCOM, BILUA,  in the last 72 hours  MICROBIOLOGY: No results found for this or any previous visit (from the past 240 hour(s)).  RADIOLOGY STUDIES/RESULTS: Ct Abdomen Pelvis Wo Contrast  08/23/2014   CLINICAL DATA:  Anorexia. Generalized weakness. Symptoms worse over the last 8 weeks. 15 lb weight loss.  Frequent falls. Jaundice. No abdominal pain, nausea, vomiting.  EXAM: CT ABDOMEN AND PELVIS WITHOUT CONTRAST  TECHNIQUE: Multidetector CT imaging of the abdomen and pelvis was performed following the standard protocol without IV contrast.  COMPARISON:  CT of the abdomen and pelvis on 02/28/2012  FINDINGS: Lower chest: There is mild fibrosis at the lung bases which may be chronic. Atherosclerotic calcification is noted in the coronary vessels. There is a diaphragmatic node measuring 1.0 cm.  Upper abdomen: There is marked intrahepatic and extrahepatic biliary ductal dilatation. The common bile duct measures as large as 2.6 cm. A distal common bile duct hyperdense mass is faintly seen and measures 1.4 cm. This may represent a gallstone or soft tissue mass. The patient has had previous cholecystectomy. Given the lack of intravenous contrast, it would be difficult to exclude a liver lesion but none are seen. A probable hyperdense cyst is identified involving the midpole region right kidney and measures 1.6 cm. The left kidney has a normal appearance. The spleen has a normal appearance. Adrenal glands are unremarkable in appearance. No focal abnormality identified within the pancreas.  Bowel: The stomach and small bowel loops are normal in appearance. There is moderate sigmoid  diverticulosis. Significant stool is identified throughout the colon. The appendix is well seen and has a normal appearance.  Pelvis: The uterus is surgically absent. The urinary bladder is distended. Suspect right adnexal mass/cyst measuring 1.7 cm. Left adnexal region has a normal appearance. There is no free pelvic fluid.  Retroperitoneum: There is atherosclerotic calcification of the aorta. No retroperitoneal or mesenteric adenopathy.  Abdominal wall: Unremarkable.  Osseous structures: Significant hardware from prior spinal fusion. No suspicious lytic or blastic lesions.  IMPRESSION: 1. Marked biliary dilatation to the level of the distal common bile duct. Faint hyperdense mass measuring 1.4 cm seen in the distal common bile duct. Considerations include a stone or tumor. Further evaluation is warranted. Consider MRI/MRCP if the patient is able to tolerate MRI. Otherwise, consider ultrasound of the abdomen. 2. Probable right renal cyst. 3. Sigmoid diverticulosis. 4. Suspect right adnexal mass/cyst measuring 1.7 cm. This has benign characteristics. No further evaluation is felt to be necessary. This recommendation follows ACR consensus guidelines: White Paper of the ACR Incidental Findings Committee II on Adnexal Findings. J Am Coll Radiol [phone].   Electronically Signed   By: Shon Hale M.D.   On: 08/23/2014 16:54   Dg Abd 1 View  09/05/2014   CLINICAL DATA:  Biliary stent.  EXAM: ABDOMEN - 1 VIEW  COMPARISON:  September 04, 2014.  FINDINGS: Status post posterior fusion of lower thoracic and lumbar spine. Status post cholecystectomy. Biliary stent is unchanged in position along the right side of the abdomen most likely within the cecum. Mildly dilated small bowel loops are again noted and unchanged suggesting possible ileus. Stool is noted in the rectum.  IMPRESSION: Biliary stent is unchanged in position within the cecum. Stable mild small bowel dilatation is noted most consistent with ileus.    Electronically Signed   By: Sabino Dick M.D.   On: 09/05/2014 08:53   Ct Head Wo Contrast  08/23/2014   CLINICAL DATA:  78 year old female with fall, weakness, head and neck injury.  EXAM: CT HEAD WITHOUT CONTRAST  CT CERVICAL SPINE WITHOUT CONTRAST  TECHNIQUE: Multidetector CT imaging of the head and cervical spine was performed following the standard protocol without intravenous contrast. Multiplanar CT image reconstructions of the cervical spine were also generated.  COMPARISON:  03/28/2012 head CT  FINDINGS: CT HEAD FINDINGS  Minimal chronic small-vessel white matter ischemic changes again noted.  No acute intracranial abnormalities are identified, including mass lesion or mass effect, hydrocephalus, extra-axial fluid collection, midline shift, hemorrhage, or acute infarction. The visualized bony calvarium is unremarkable.  CT CERVICAL SPINE FINDINGS  There is no evidence of acute fracture or subluxation or prevertebral soft tissue swelling.  Multilevel degenerative disc disease, spondylosis and facet arthropathy identified with moderate degenerative disc disease from C5-C7.  The soft tissue structures are unremarkable.  IMPRESSION: No evidence of acute intracranial abnormality or static evidence of acute injury to the cervical spine.  Degenerative changes within the cervical spine.   Electronically Signed   By: Hassan Rowan M.D.   On: 08/23/2014 16:47   Ct Cervical Spine Wo Contrast  08/23/2014   CLINICAL DATA:  79 year old female with fall, weakness, head and neck injury.  EXAM: CT HEAD WITHOUT CONTRAST  CT CERVICAL SPINE WITHOUT CONTRAST  TECHNIQUE: Multidetector CT imaging of the head and cervical spine was performed following the standard protocol without intravenous contrast. Multiplanar CT image reconstructions of the cervical spine were also generated.  COMPARISON:  03/28/2012 head CT  FINDINGS: CT HEAD FINDINGS  Minimal chronic small-vessel white matter ischemic changes again noted.  No acute  intracranial abnormalities are identified, including mass lesion or mass effect, hydrocephalus, extra-axial fluid collection, midline shift, hemorrhage, or acute infarction. The visualized bony calvarium is unremarkable.  CT CERVICAL SPINE FINDINGS  There is no evidence of acute fracture or subluxation or prevertebral soft tissue swelling.  Multilevel degenerative disc disease, spondylosis and facet arthropathy identified with moderate degenerative disc disease from C5-C7.  The soft tissue structures are unremarkable.  IMPRESSION: No evidence of acute intracranial abnormality or static evidence of acute injury to the cervical spine.  Degenerative changes within the cervical spine.   Electronically Signed   By: Hassan Rowan M.D.   On: 08/23/2014 16:47   US Abdomen Complete  08/23/2014   CLINICAL DATA:  78 year old female with jaundice. History of cholecystectomy.  EXAM: ULTRASOUND ABDOMEN COMPLETE  COMPARISON:  02/28/2012 CT  FINDINGS: Gallbladder:  Gallbladder is not visualized compatible with cholecystectomy.  Common bile duct:  Diameter: 18.6 mm. Intrahepatic biliary dilatation is identified. An obstructing cause is not identified on this study but the mid and distal CBD are not well visualized.  Liver:  No focal lesion identified. Within normal limits in parenchymal echogenicity.  IVC:  No abnormality visualized.  Pancreas:  The head and tail of the pancreas are difficult to visualize. Pancreatic ductal dilatation is noted.  Spleen:  Size and appearance within normal limits.  Right Kidney:  Length: 12.1 cm. Echogenicity is upper limits of normal. No mass or hydronephrosis visualized. Multiple cysts are identified.  Left Kidney:  Length: 11.7 cm. Echogenicity is upper limits of normal. No mass or hydronephrosis visualized.  Abdominal aorta:  No aneurysm visualized.  Other findings:  None.  IMPRESSION: CBD and intrahepatic biliary dilatation and pancreatic ductal dilatation without identifiable obstructing cause on  this study. The mid and distal CBD as well as the pancreatic head are not well visualized. Recommend further evaluation.  Upper limits of normal renal echogenicity which may be seen with medical renal disease.  Cholecystectomy.   Electronically Signed   By: Hassan Rowan M.D.   On: 08/23/2014 16:26   Mr 3d Recon At Scanner  08/26/2014   CLINICAL DATA:  Evaluate for biliary tract obstruction.  EXAM: MRI ABDOMEN WITHOUT AND WITH CONTRAST (INCLUDING MRCP)  TECHNIQUE: Multiplanar multisequence MR imaging of the abdomen was performed both before and after the administration of intravenous contrast. Heavily T2-weighted images of the biliary and pancreatic ducts were obtained, and three-dimensional MRCP images were rendered by post processing.  CONTRAST:  37mL MULTIHANCE GADOBENATE DIMEGLUMINE 529 MG/ML IV SOLN  COMPARISON:  No priors. CT of the abdomen and pelvis without contrast 08/23/2014.  FINDINGS: Patient is status post cholecystectomy. MRCP images demonstrates moderate intra and extrahepatic biliary ductal dilatation. Common bile duct measures up to 10 mm distally, before it abruptly tapers to approximately 1 mm immediately adjacent to the level of the ampulla. This narrowing spans a length of approximately 1.4 cm, and there is mild soft tissue prominence around this narrowed area of the duct, which demonstrates very subtle differential enhancement compared to the remainder of the pancreatic parenchyma. While there is no definite pancreatic head mass, the possibility of a tiny ampullary lesion is difficult to entirely exclude; however, these findings are favored to reflect a distal ductal stricture. Additionally, the pancreatic duct is not dilated. On image 31 of series 5 there is a tiny filling defect in the distal common bile duct, however, this is favored to be related to flow as there are no filling defects in this region on any of the other pulse sequences (including MRCP images) to suggest underlying ductal  stone.  No suspicious hepatic lesion noted. There is slight heterogeneous signal intensity in a body and tail of the pancreas on T2 weighted images, with a small amount of fluid in the adjacent retroperitoneal regions, suggesting mild or resolving pancreatitis. This amorphous retroperitoneal fluid extends into the paracolic gutters bilaterally. No discrete peripancreatic fluid collections are noted at this time. The appearance of the spleen and bilateral adrenal glands is unremarkable. Multiple renal lesions are noted which are low T1 signal intensity, high T2 signal intensity, and do not enhance, compatible with simple cysts, largest of which measure up to 1.5 cm in diameter in the interpolar region of the right kidney.  Orthopedic fixation hardware in the visualized thoracolumbar spine resulting in artifact throughout this region. Small right pleural effusion layering dependently.  IMPRESSION: 1. The common bile duct is dilated to approximately 10 mm and there is moderate intrahepatic biliary ductal dilatation, indicative of biliary tract obstruction. This is associated with a long segment of narrowing over approximately 1.4 cm in the distal common bile duct immediately before the ampulla where the duct tapers to approximately 1 mm in diameter. In the region of ductal narrowing there is some prominence of the surrounding soft tissues, which demonstrate very subtle differential enhancement. It is difficult to discern whether or not this is simply a stricture, or if there is a small circumferential ampullary lesion in this region. Correlation with endoscopic ultrasound, ERCP and potential biopsy is recommended. 2. The appearance of the pancreas suggest either mild or resolving pancreatitis. No discrete peripancreatic fluid collection to suggest pseudocyst at this time. 3. Status post cholecystectomy. 4. Additional incidental findings, as above.   Electronically Signed   By: Vinnie Langton M.D.   On: 08/26/2014  08:41   US Abdomen Limited  09/04/2014   CLINICAL DATA:  Right upper quadrant pain. History of cholecystectomy and common bile duct stent.  EXAM: US ABDOMEN LIMITED - RIGHT UPPER QUADRANT  COMPARISON:  ERCP 08/26/2014 and MRCP 08/25/2014. Ultrasound abdomen 08/23/2014.  FINDINGS: Gallbladder:  Surgically absent.  Common bile duct:  Diameter: Measures up to 12 mm (previously 10 mm on 08/25/2014).  Liver:  No focal lesion identified. Within normal limits in parenchymal echogenicity.  IMPRESSION: Common bile duct is stable to minimally more prominent than on 08/25/2014. Patient is status post cholecystectomy.   Electronically Signed   By: Lorin Picket M.D.   On: 09/04/2014 14:49   Dg Chest Port 1 View  08/23/2014   CLINICAL DATA:  SOB.  Weakness, jaundice, hypertension.  EXAM: PORTABLE CHEST - 1 VIEW  COMPARISON:  04/28/2007  FINDINGS: Shallow lung inflation. The heart is enlarged. There is perihilar peribronchial thickening. No focal consolidations or pleural effusions. No pulmonary edema. The patient has had previous left clavicle ORIF an left shoulder arthroplasty. Patient has had posterior thoracolumbar fusion. Surgical clips are noted in the right upper quadrant of the abdomen.  IMPRESSION: 1. Shallow inflation. 2. Bronchitic changes. 3.  No focal acute pulmonary abnormality.   Electronically Signed   By: Shon Hale M.D.   On: 08/23/2014 13:32   Dg Ercp Biliary & Pancreatic Ducts  08/26/2014   CLINICAL DATA:  Biliary obstruction.  EXAM: ERCP  TECHNIQUE: Multiple spot images obtained with the fluoroscopic device and submitted for interpretation post-procedure.  COMPARISON:  None.  FINDINGS: Images demonstrate cannulation of the common bile duct, contrast filling a dilated biliary tree, and placement of a stent across the ampulla of. Balloon stone extraction is also noted.  IMPRESSION: ERCP.  These images were submitted for radiologic interpretation only. Please see the procedural report for the amount  of contrast and the fluoroscopy time utilized.   Electronically Signed   By: Maryclare Bean M.D.   On: 08/26/2014 12:50   Dg Abd 2 Views  09/06/2014   CLINICAL DATA:  Reassess stent location.  EXAM: ABDOMEN - 2 VIEW  COMPARISON:  Abdominal films of September 05, 2014.  FINDINGS: The biliary stent tube. Projects over the upper aspect of the right iliac crest likely in the cecum. The bowel gas pattern suggests a mild small bowel ileusbstruction. Extensive postfusion changes of the lumbar spine are present. There surgical clips in the gallbladder fossa.  IMPRESSION: The biliary structure remains in the right lower quadrant of the abdomen. The small bowel gas pattern suggests a mild ileus but no significant obstructive findings are demonstrated.   Electronically Signed   By: David  Martinique   On: 09/06/2014 07:52   Dg Abd Acute W/chest  09/04/2014   CLINICAL DATA:  78 year old female with abdominal pain and vomiting. Recent bowel obstruction  EXAM: ACUTE ABDOMEN SERIES (ABDOMEN 2 VIEW & CHEST 1 VIEW)  COMPARISON:  CT abdomen pelvis 08/23/2014, chest x-ray 08/23/2014 new  FINDINGS: Chest:  Low lung volumes persist, which accentuates the interstitium. Interlobular septal thickening, with some indistinctness around the central pulmonary vasculature. No pneumothorax or pleural effusion. No confluent airspace disease.  Surgical changes of prior reverse left shoulder hemi arthroplasty, as well as plate and screw fixation of prior left clavicle fracture.  Surgical changes of prior cholecystectomy and spinal fixation.  Abdomen:  Multiple borderline dilated small bowel loops. There is a relative paucity of colonic gas. Formed stool within the rectum.  Left decubitus image demonstrates no evidence of layering free air.  The biliary stent projects over the right colon, having migrated from its previous placement on 08/26/2014.  No displaced fracture.  IMPRESSION: Evidence of developing interstitial pulmonary edema.  No evidence of  free air, however, multiple borderline dilated small bowel loops suggest ileus and/or developing small bowel obstruction. Further imaging is recommended, such as serial plain film until resolution of the  bowel gas pattern, or alternatively CT to evaluate for acute abnormality.  Migration of previous biliary stent, potentially within the small bowel or right colon.  Previous surgical changes, as above.  Signed,  Dulcy Fanny. Earleen Newport, DO  [facility]  [facility]   Electronically Signed   By: Corrie Mckusick O.D.   On: 09/04/2014 15:53   Mr Abd W/wo Cm/mrcp  08/26/2014   CLINICAL DATA:  Evaluate for biliary tract obstruction.  EXAM: MRI ABDOMEN WITHOUT AND WITH CONTRAST (INCLUDING MRCP)  TECHNIQUE: Multiplanar multisequence MR imaging of the abdomen was performed both before and after the administration of intravenous contrast. Heavily T2-weighted images of the biliary and pancreatic ducts were obtained, and three-dimensional MRCP images were rendered by post processing.  CONTRAST:  34mL MULTIHANCE GADOBENATE DIMEGLUMINE 529 MG/ML IV SOLN  COMPARISON:  No priors. CT of the abdomen and pelvis without contrast 08/23/2014.  FINDINGS: Patient is status post cholecystectomy. MRCP images demonstrates moderate intra and extrahepatic biliary ductal dilatation. Common bile duct measures up to 10 mm distally, before it abruptly tapers to approximately 1 mm immediately adjacent to the level of the ampulla. This narrowing spans a length of approximately 1.4 cm, and there is mild soft tissue prominence around this narrowed area of the duct, which demonstrates very subtle differential enhancement compared to the remainder of the pancreatic parenchyma. While there is no definite pancreatic head mass, the possibility of a tiny ampullary lesion is difficult to entirely exclude; however, these findings are favored to reflect a distal ductal stricture. Additionally, the pancreatic duct is not  dilated. On image 31 of series 5 there is a tiny filling defect in the distal common bile duct, however, this is favored to be related to flow as there are no filling defects in this region on any of the other pulse sequences (including MRCP images) to suggest underlying ductal stone.  No suspicious hepatic lesion noted. There is slight heterogeneous signal intensity in a body and tail of the pancreas on T2 weighted images, with a small amount of fluid in the adjacent retroperitoneal regions, suggesting mild or resolving pancreatitis. This amorphous retroperitoneal fluid extends into the paracolic gutters bilaterally. No discrete peripancreatic fluid collections are noted at this time. The appearance of the spleen and bilateral adrenal glands is unremarkable. Multiple renal lesions are noted which are low T1 signal intensity, high T2 signal intensity, and do not enhance, compatible with simple cysts, largest of which measure up to 1.5 cm in diameter in the interpolar region of the right kidney.  Orthopedic fixation hardware in the visualized thoracolumbar spine resulting in artifact throughout this region. Small right pleural effusion layering dependently.  IMPRESSION: 1. The common bile duct is dilated to approximately 10 mm and there is moderate intrahepatic biliary ductal dilatation, indicative of biliary tract obstruction. This is associated with a long segment of narrowing over approximately 1.4 cm in the distal common bile duct immediately before the ampulla where the duct tapers to approximately 1 mm in diameter. In the region of ductal narrowing there is some prominence of the surrounding soft tissues, which demonstrate very subtle differential enhancement. It is difficult to discern whether or not this is simply a stricture, or if there is a small circumferential ampullary lesion in this region. Correlation with endoscopic ultrasound, ERCP and potential biopsy is recommended. 2. The appearance of the  pancreas suggest either mild or resolving pancreatitis. No discrete peripancreatic fluid collection to suggest pseudocyst at this time. 3. Status post  cholecystectomy. 4. Additional incidental findings, as above.   Electronically Signed   By: Vinnie Langton M.D.   On: 08/26/2014 08:41    Oren Binet, MD  [facility] Pager:[phone]  If 7PM-7AM, please contact night-coverage [email or web address] Password [email or web address] 09/07/2014, 2:19 PM   LOS: 3 days   **Disclaimer: This note may have been dictated with voice recognition software. Similar sounding words can inadvertently be transcribed and this note may contain transcription errors which may not have been corrected upon publication of note.**

## 2014-09-08 NOTE — Op Note (Signed)
[facility] [place], [id number]   COLONOSCOPY PROCEDURE REPORT  PATIENT: [name] [name], [name] [name]  MR#: [id number] BIRTHDATE: 19-Feb-1936 , 79  yrs. old GENDER: female ENDOSCOPIST: Arta Silence, MD REFERRED OI:[facility] PROCEDURE DATE:  09/08/2014 PROCEDURE:   Colonoscopy with fb removal ASA CLASS:   Class III INDICATIONS:Foreign body (biliary stent retained in cecum). MEDICATIONS: Monitored anesthesia care  DESCRIPTION OF PROCEDURE:   After the risks benefits and alternatives of the procedure were thoroughly explained, informed consent was obtained.  revealed no abnormalities of the rectum. The pediatric colonoscope was introduced through the anus and advanced to the cecum, which was identified by both the appendix and ileocecal valve. No adverse events experienced.   The quality of the prep was adequate.  The instrument was then slowly withdrawn as the colon was fully examined.    Findings:  Normal digital rectal exam.  Prep quality was adequate. Scattered diverticula seen throughout the colon.  No obvious large colonic pathology was noted, but today's exam was not for colon cancer screening, and I did not evaluate the colon for polyps or cancer.     The double pigtail stent was seen protruding from the cecum; one end of the pigtail was in the base of the cecum and the other end was extending into the proximal ascending colon.  There was some ulceration in the cecum, likely from stent-related irritation.  The distal end of the stent was grasped with a snare and the stent was removed from the colon in toto without immediate complication.  Retroflexion into rectum was not performed. Withdrawal time was   .  The scope was withdrawn and the procedure completed.  COMPLICATIONS: None  ENDOSCOPIC IMPRESSION:     As above.  Successful removal of biliary stent from base of cecum.  RECOMMENDATIONS:     1.  Watch for potential  complications of procedure. 2.  If ok after today's procedure, she can probably go home later today. 3.  Outpatient EUS/ERCP next Wednesday, as already scheduled. 4.  Timing of repeat screening/surveillance colonoscopy should be based on findings after her most recent colonoscopy prior to today; today's exam was not performed for colon cancer screening or polyp surveillance.  eSigned:  Arta Silence, MD 09/08/2014 11:05 AM   cc:

## 2014-09-08 NOTE — Progress Notes (Signed)
09/08/14 Patient being discharged home today, IV site removed, Discharge instructions given for home discussed

## 2014-09-08 NOTE — Interval H&P Note (Signed)
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 After consideration of risks, benefits and other options for treatment, the patient has consented to  Procedure(s): COLONOSCOPY (Left) as a surgical intervention .  The patient's history has been reviewed, patient examined, no change in status, stable for surgery.  I have reviewed the patient's chart and labs.  Questions were answered to the patient's satisfaction.     Danuel Felicetti M  Assessment:    1.  Retained biliary stent near ileocecal valve; not migrating significantly despite bowel purgatives.  Plan:  1.  Colonoscopy for foreign body retrieval. 2.  Risks (bleeding, infection, bowel perforation that could require surgery, sedation-related changes in cardiopulmonary systems), benefits (identification and possible treatment of source of symptoms, exclusion of certain causes of symptoms), and alternatives (watchful waiting, radiographic imaging studies, empiric medical treatment) of colonoscopy were explained to patient/family in detail and patient wishes to proceed.

## 2014-09-08 NOTE — Discharge Summary (Addendum)
PATIENT DETAILS Name: [name] [name] Age: 78 y.o. Sex: female Date of Birth: 1935/12/23 MRN: [id number]. Admitting Physician: Domenic Polite, MD HUT:[id number] [name], MD  Admit Date: 09/04/2014 Discharge date: 09/08/2014  Recommendations for Outpatient Follow-up:  1. Needs further work up for Biliary Stricture-has appt on 9/30 with [facility] for further procedures including biliary endoscopy  PRIMARY DISCHARGE DIAGNOSIS:  Principal Problem:   Nausea and vomiting Active Problems:   Hypertension   GERD (gastroesophageal reflux disease)   Anxiety   Protein-calorie malnutrition, severe   Enteritis due to Clostridium difficile   Common bile duct (CBD) stricture   Pulmonary edema   Migration of biliary stent   Ileus   Leukocytosis, unspecified      PAST MEDICAL HISTORY: Past Medical History  Diagnosis Date  . Hypertension   . Hyperlipidemia   . Thyroid disease   . GERD (gastroesophageal reflux disease)   . Anxiety   . IBS (irritable bowel syndrome)   . Back pain   . Blood transfusion     at age of 4  . Hypothyroidism   . H/O hiatal hernia   . Anemia     hx of years ago   . Arthritis     psoriatic arthritis  . ARF (acute renal failure) 08/23/2014  . Pancreatitis     DISCHARGE MEDICATIONS:   Medication List    STOP taking these medications       esomeprazole 40 MG capsule  Commonly known as:  [place] these medications       acetaminophen 500 MG tablet  Commonly known as:  TYLENOL  Take 1,000 mg by mouth once as needed for moderate pain.     ALPRAZolam 1 MG tablet  Commonly known as:  XANAX  Take 1 mg by mouth 4 (four) times daily as needed for anxiety.     amLODipine 10 MG tablet  Commonly known as:  NORVASC  Take 10 mg by mouth every morning.     atenolol 50 MG tablet  Commonly known as:  TENORMIN  Take 50 mg by mouth every morning.     desvenlafaxine 50 MG 24 hr tablet  Commonly known as:  PRISTIQ  Take 50 mg by mouth every  morning.     ENLYTE Caps  Take 1 tablet by mouth daily.     feeding supplement (RESOURCE BREEZE) Liqd  Take 1 Container by mouth 3 (three) times daily between meals.     fenofibrate 145 MG tablet  Commonly known as:  TRICOR  Take 145 mg by mouth daily with breakfast.     furosemide 20 MG tablet  Commonly known as:  LASIX  Take 20 mg by mouth daily with breakfast.     gabapentin 400 MG capsule  Commonly known as:  NEURONTIN  Take 400 mg by mouth 4 (four) times daily as needed (for pain).     hydrochlorothiazide 25 MG tablet  Commonly known as:  HYDRODIURIL  Take 25 mg by mouth daily.     hyoscyamine 0.375 MG 12 hr tablet  Commonly known as:  LEVBID  Take 0.375 mg by mouth every 12 (twelve) hours as needed for cramping.     levothyroxine 100 MCG tablet  Commonly known as:  SYNTHROID, LEVOTHROID  Take 100 mcg by mouth daily before breakfast.     metroNIDAZOLE 500 MG tablet  Commonly known as:  FLAGYL  Take 1 tablet (500 mg total) by mouth every 8 (eight) hours.  Last day 9/28     multivitamin with minerals Tabs tablet  Take 1 tablet by mouth daily.     ondansetron 4 MG tablet  Commonly known as:  ZOFRAN  Take 1 tablet (4 mg total) by mouth every 8 (eight) hours as needed for nausea or vomiting.     potassium chloride SA 20 MEQ tablet  Commonly known as:  K-DUR,KLOR-CON  Take 20 mEq by mouth 2 (two) times daily.     tiZANidine 4 MG tablet  Commonly known as:  ZANAFLEX  Take 4 mg by mouth every 8 (eight) hours as needed for muscle spasms.     traMADol 50 MG tablet  Commonly known as:  ULTRAM  Take 50 mg by mouth every 6 (six) hours as needed for moderate pain.     zolpidem 10 MG tablet  Commonly known as:  AMBIEN  Take 10 mg by mouth at bedtime.        ALLERGIES:   Allergies  Allergen Reactions  . Tetanus Toxoids Swelling  . Yellow Dyes (Non-Tartrazine) Itching and Other (See Comments)    Makes patient nervous     BRIEF HPI:  See H&P, Labs, Consult  and Test reports for all details in brief, patient is a 78 y.o. female with HTN, recent admission for obstructive jaundice due to biliary stricture-s/p stenting, admitted for vomiting, abdominal pain, XRay in the ED showed dislodged biliary stent.  CONSULTATIONS:   GI  PERTINENT RADIOLOGIC STUDIES: Ct Abdomen Pelvis Wo Contrast  08/23/2014   CLINICAL DATA:  Anorexia. Generalized weakness. Symptoms worse over the last 8 weeks. 15 lb weight loss. Frequent falls. Jaundice. No abdominal pain, nausea, vomiting.  EXAM: CT ABDOMEN AND PELVIS WITHOUT CONTRAST  TECHNIQUE: Multidetector CT imaging of the abdomen and pelvis was performed following the standard protocol without IV contrast.  COMPARISON:  CT of the abdomen and pelvis on 02/28/2012  FINDINGS: Lower chest: There is mild fibrosis at the lung bases which may be chronic. Atherosclerotic calcification is noted in the coronary vessels. There is a diaphragmatic node measuring 1.0 cm.  Upper abdomen: There is marked intrahepatic and extrahepatic biliary ductal dilatation. The common bile duct measures as large as 2.6 cm. A distal common bile duct hyperdense mass is faintly seen and measures 1.4 cm. This may represent a gallstone or soft tissue mass. The patient has had previous cholecystectomy. Given the lack of intravenous contrast, it would be difficult to exclude a liver lesion but none are seen. A probable hyperdense cyst is identified involving the midpole region right kidney and measures 1.6 cm. The left kidney has a normal appearance. The spleen has a normal appearance. Adrenal glands are unremarkable in appearance. No focal abnormality identified within the pancreas.  Bowel: The stomach and small bowel loops are normal in appearance. There is moderate sigmoid diverticulosis. Significant stool is identified throughout the colon. The appendix is well seen and has a normal appearance.  Pelvis: The uterus is surgically absent. The urinary bladder is distended.  Suspect right adnexal mass/cyst measuring 1.7 cm. Left adnexal region has a normal appearance. There is no free pelvic fluid.  Retroperitoneum: There is atherosclerotic calcification of the aorta. No retroperitoneal or mesenteric adenopathy.  Abdominal wall: Unremarkable.  Osseous structures: Significant hardware from prior spinal fusion. No suspicious lytic or blastic lesions.  IMPRESSION: 1. Marked biliary dilatation to the level of the distal common bile duct. Faint hyperdense mass measuring 1.4 cm seen in the distal common bile duct. Considerations include a  stone or tumor. Further evaluation is warranted. Consider MRI/MRCP if the patient is able to tolerate MRI. Otherwise, consider ultrasound of the abdomen. 2. Probable right renal cyst. 3. Sigmoid diverticulosis. 4. Suspect right adnexal mass/cyst measuring 1.7 cm. This has benign characteristics. No further evaluation is felt to be necessary. This recommendation follows ACR consensus guidelines: White Paper of the ACR Incidental Findings Committee II on Adnexal Findings. J Am Coll Radiol [phone].   Electronically Signed   By: Shon Hale M.D.   On: 08/23/2014 16:54   Dg Abd 1 View  09/05/2014   CLINICAL DATA:  Biliary stent.  EXAM: ABDOMEN - 1 VIEW  COMPARISON:  September 04, 2014.  FINDINGS: Status post posterior fusion of lower thoracic and lumbar spine. Status post cholecystectomy. Biliary stent is unchanged in position along the right side of the abdomen most likely within the cecum. Mildly dilated small bowel loops are again noted and unchanged suggesting possible ileus. Stool is noted in the rectum.  IMPRESSION: Biliary stent is unchanged in position within the cecum. Stable mild small bowel dilatation is noted most consistent with ileus.   Electronically Signed   By: Sabino Dick M.D.   On: 09/05/2014 08:53   Ct Head Wo Contrast  08/23/2014   CLINICAL DATA:  78 year old female with fall, weakness, head and neck injury.  EXAM: CT HEAD  WITHOUT CONTRAST  CT CERVICAL SPINE WITHOUT CONTRAST  TECHNIQUE: Multidetector CT imaging of the head and cervical spine was performed following the standard protocol without intravenous contrast. Multiplanar CT image reconstructions of the cervical spine were also generated.  COMPARISON:  03/28/2012 head CT  FINDINGS: CT HEAD FINDINGS  Minimal chronic small-vessel white matter ischemic changes again noted.  No acute intracranial abnormalities are identified, including mass lesion or mass effect, hydrocephalus, extra-axial fluid collection, midline shift, hemorrhage, or acute infarction. The visualized bony calvarium is unremarkable.  CT CERVICAL SPINE FINDINGS  There is no evidence of acute fracture or subluxation or prevertebral soft tissue swelling.  Multilevel degenerative disc disease, spondylosis and facet arthropathy identified with moderate degenerative disc disease from C5-C7.  The soft tissue structures are unremarkable.  IMPRESSION: No evidence of acute intracranial abnormality or static evidence of acute injury to the cervical spine.  Degenerative changes within the cervical spine.   Electronically Signed   By: Hassan Rowan M.D.   On: 08/23/2014 16:47   Ct Cervical Spine Wo Contrast  08/23/2014   CLINICAL DATA:  78 year old female with fall, weakness, head and neck injury.  EXAM: CT HEAD WITHOUT CONTRAST  CT CERVICAL SPINE WITHOUT CONTRAST  TECHNIQUE: Multidetector CT imaging of the head and cervical spine was performed following the standard protocol without intravenous contrast. Multiplanar CT image reconstructions of the cervical spine were also generated.  COMPARISON:  03/28/2012 head CT  FINDINGS: CT HEAD FINDINGS  Minimal chronic small-vessel white matter ischemic changes again noted.  No acute intracranial abnormalities are identified, including mass lesion or mass effect, hydrocephalus, extra-axial fluid collection, midline shift, hemorrhage, or acute infarction. The visualized bony calvarium is  unremarkable.  CT CERVICAL SPINE FINDINGS  There is no evidence of acute fracture or subluxation or prevertebral soft tissue swelling.  Multilevel degenerative disc disease, spondylosis and facet arthropathy identified with moderate degenerative disc disease from C5-C7.  The soft tissue structures are unremarkable.  IMPRESSION: No evidence of acute intracranial abnormality or static evidence of acute injury to the cervical spine.  Degenerative changes within the cervical spine.   Electronically Signed  By: Hassan Rowan M.D.   On: 08/23/2014 16:47   US Abdomen Complete  08/23/2014   CLINICAL DATA:  78 year old female with jaundice. History of cholecystectomy.  EXAM: ULTRASOUND ABDOMEN COMPLETE  COMPARISON:  02/28/2012 CT  FINDINGS: Gallbladder:  Gallbladder is not visualized compatible with cholecystectomy.  Common bile duct:  Diameter: 18.6 mm. Intrahepatic biliary dilatation is identified. An obstructing cause is not identified on this study but the mid and distal CBD are not well visualized.  Liver:  No focal lesion identified. Within normal limits in parenchymal echogenicity.  IVC:  No abnormality visualized.  Pancreas:  The head and tail of the pancreas are difficult to visualize. Pancreatic ductal dilatation is noted.  Spleen:  Size and appearance within normal limits.  Right Kidney:  Length: 12.1 cm. Echogenicity is upper limits of normal. No mass or hydronephrosis visualized. Multiple cysts are identified.  Left Kidney:  Length: 11.7 cm. Echogenicity is upper limits of normal. No mass or hydronephrosis visualized.  Abdominal aorta:  No aneurysm visualized.  Other findings:  None.  IMPRESSION: CBD and intrahepatic biliary dilatation and pancreatic ductal dilatation without identifiable obstructing cause on this study. The mid and distal CBD as well as the pancreatic head are not well visualized. Recommend further evaluation.  Upper limits of normal renal echogenicity which may be seen with medical renal  disease.  Cholecystectomy.   Electronically Signed   By: Hassan Rowan M.D.   On: 08/23/2014 16:26   Mr 3d Recon At Scanner  08/26/2014   CLINICAL DATA:  Evaluate for biliary tract obstruction.  EXAM: MRI ABDOMEN WITHOUT AND WITH CONTRAST (INCLUDING MRCP)  TECHNIQUE: Multiplanar multisequence MR imaging of the abdomen was performed both before and after the administration of intravenous contrast. Heavily T2-weighted images of the biliary and pancreatic ducts were obtained, and three-dimensional MRCP images were rendered by post processing.  CONTRAST:  69mL MULTIHANCE GADOBENATE DIMEGLUMINE 529 MG/ML IV SOLN  COMPARISON:  No priors. CT of the abdomen and pelvis without contrast 08/23/2014.  FINDINGS: Patient is status post cholecystectomy. MRCP images demonstrates moderate intra and extrahepatic biliary ductal dilatation. Common bile duct measures up to 10 mm distally, before it abruptly tapers to approximately 1 mm immediately adjacent to the level of the ampulla. This narrowing spans a length of approximately 1.4 cm, and there is mild soft tissue prominence around this narrowed area of the duct, which demonstrates very subtle differential enhancement compared to the remainder of the pancreatic parenchyma. While there is no definite pancreatic head mass, the possibility of a tiny ampullary lesion is difficult to entirely exclude; however, these findings are favored to reflect a distal ductal stricture. Additionally, the pancreatic duct is not dilated. On image 31 of series 5 there is a tiny filling defect in the distal common bile duct, however, this is favored to be related to flow as there are no filling defects in this region on any of the other pulse sequences (including MRCP images) to suggest underlying ductal stone.  No suspicious hepatic lesion noted. There is slight heterogeneous signal intensity in a body and tail of the pancreas on T2 weighted images, with a small amount of fluid in the adjacent  retroperitoneal regions, suggesting mild or resolving pancreatitis. This amorphous retroperitoneal fluid extends into the paracolic gutters bilaterally. No discrete peripancreatic fluid collections are noted at this time. The appearance of the spleen and bilateral adrenal glands is unremarkable. Multiple renal lesions are noted which are low T1 signal intensity, high T2 signal intensity,  and do not enhance, compatible with simple cysts, largest of which measure up to 1.5 cm in diameter in the interpolar region of the right kidney.  Orthopedic fixation hardware in the visualized thoracolumbar spine resulting in artifact throughout this region. Small right pleural effusion layering dependently.  IMPRESSION: 1. The common bile duct is dilated to approximately 10 mm and there is moderate intrahepatic biliary ductal dilatation, indicative of biliary tract obstruction. This is associated with a long segment of narrowing over approximately 1.4 cm in the distal common bile duct immediately before the ampulla where the duct tapers to approximately 1 mm in diameter. In the region of ductal narrowing there is some prominence of the surrounding soft tissues, which demonstrate very subtle differential enhancement. It is difficult to discern whether or not this is simply a stricture, or if there is a small circumferential ampullary lesion in this region. Correlation with endoscopic ultrasound, ERCP and potential biopsy is recommended. 2. The appearance of the pancreas suggest either mild or resolving pancreatitis. No discrete peripancreatic fluid collection to suggest pseudocyst at this time. 3. Status post cholecystectomy. 4. Additional incidental findings, as above.   Electronically Signed   By: Vinnie Langton M.D.   On: 08/26/2014 08:41   US Abdomen Limited  09/04/2014   CLINICAL DATA:  Right upper quadrant pain. History of cholecystectomy and common bile duct stent.  EXAM: US ABDOMEN LIMITED - RIGHT UPPER QUADRANT   COMPARISON:  ERCP 08/26/2014 and MRCP 08/25/2014. Ultrasound abdomen 08/23/2014.  FINDINGS: Gallbladder:  Surgically absent.  Common bile duct:  Diameter: Measures up to 12 mm (previously 10 mm on 08/25/2014).  Liver:  No focal lesion identified. Within normal limits in parenchymal echogenicity.  IMPRESSION: Common bile duct is stable to minimally more prominent than on 08/25/2014. Patient is status post cholecystectomy.   Electronically Signed   By: Lorin Picket M.D.   On: 09/04/2014 14:49   Dg Chest Port 1 View  08/23/2014   CLINICAL DATA:  SOB.  Weakness, jaundice, hypertension.  EXAM: PORTABLE CHEST - 1 VIEW  COMPARISON:  04/28/2007  FINDINGS: Shallow lung inflation. The heart is enlarged. There is perihilar peribronchial thickening. No focal consolidations or pleural effusions. No pulmonary edema. The patient has had previous left clavicle ORIF an left shoulder arthroplasty. Patient has had posterior thoracolumbar fusion. Surgical clips are noted in the right upper quadrant of the abdomen.  IMPRESSION: 1. Shallow inflation. 2. Bronchitic changes. 3.  No focal acute pulmonary abnormality.   Electronically Signed   By: Shon Hale M.D.   On: 08/23/2014 13:32   Dg Ercp Biliary & Pancreatic Ducts  08/26/2014   CLINICAL DATA:  Biliary obstruction.  EXAM: ERCP  TECHNIQUE: Multiple spot images obtained with the fluoroscopic device and submitted for interpretation post-procedure.  COMPARISON:  None.  FINDINGS: Images demonstrate cannulation of the common bile duct, contrast filling a dilated biliary tree, and placement of a stent across the ampulla of. Balloon stone extraction is also noted.  IMPRESSION: ERCP.  These images were submitted for radiologic interpretation only. Please see the procedural report for the amount of contrast and the fluoroscopy time utilized.   Electronically Signed   By: Maryclare Bean M.D.   On: 08/26/2014 12:50   Dg Abd 2 Views  09/08/2014   CLINICAL DATA:  Biliary stent migration   EXAM: ABDOMEN - 2 VIEW  COMPARISON:  09/07/2014  FINDINGS: Biliary stent projects over the RIGHT lower quadrant likely cecum.  Nonobstructive bowel gas pattern.  No bowel  dilatation or bowel wall thickening.  Nonspecific air-filled loops of small bowel in RIGHT mid abdomen.  Bones demineralized.  Prior thoracolumbar fusion.  Surgical clips RIGHT upper quadrant likely cholecystectomy.  Coils at lateral RIGHT mid abdomen into RIGHT pelvis.  IMPRESSION: Biliary stent is again identified in the RIGHT lower quadrant projecting likely over cecum.  No interval change.   Electronically Signed   By: Lavonia Dana M.D.   On: 09/08/2014 09:34   Dg Abd 2 Views  09/07/2014   CLINICAL DATA:  Pancreatitis.  Evaluate biliary stent position.  EXAM: ABDOMEN - 2 VIEW  COMPARISON:  09/06/2014.  09/05/2014.  FINDINGS: Biliary stent tube again noted projected over the cecum. Surgical clips right upper quadrant. Scattered air-fluid levels noted throughout the small bowel. No prominent bowel distention. Mild adynamic ileus cannot be excluded. Surgical clips right abdomen. Prior thoraco lumbar and lumbosacral fusion.  IMPRESSION: 1. Biliary stent position remains unchanged and is projected over the cecum.  2. Mild adynamic ileus.   Electronically Signed   By: Marcello Moores  Register   On: 09/07/2014 09:45   Dg Abd 2 Views  09/06/2014   CLINICAL DATA:  Reassess stent location.  EXAM: ABDOMEN - 2 VIEW  COMPARISON:  Abdominal films of September 05, 2014.  FINDINGS: The biliary stent tube. Projects over the upper aspect of the right iliac crest likely in the cecum. The bowel gas pattern suggests a mild small bowel ileusbstruction. Extensive postfusion changes of the lumbar spine are present. There surgical clips in the gallbladder fossa.  IMPRESSION: The biliary structure remains in the right lower quadrant of the abdomen. The small bowel gas pattern suggests a mild ileus but no significant obstructive findings are demonstrated.   Electronically  Signed   By: David  Martinique   On: 09/06/2014 07:52   Dg Abd Acute W/chest  09/04/2014   CLINICAL DATA:  78 year old female with abdominal pain and vomiting. Recent bowel obstruction  EXAM: ACUTE ABDOMEN SERIES (ABDOMEN 2 VIEW & CHEST 1 VIEW)  COMPARISON:  CT abdomen pelvis 08/23/2014, chest x-ray 08/23/2014 new  FINDINGS: Chest:  Low lung volumes persist, which accentuates the interstitium. Interlobular septal thickening, with some indistinctness around the central pulmonary vasculature. No pneumothorax or pleural effusion. No confluent airspace disease.  Surgical changes of prior reverse left shoulder hemi arthroplasty, as well as plate and screw fixation of prior left clavicle fracture.  Surgical changes of prior cholecystectomy and spinal fixation.  Abdomen:  Multiple borderline dilated small bowel loops. There is a relative paucity of colonic gas. Formed stool within the rectum.  Left decubitus image demonstrates no evidence of layering free air.  The biliary stent projects over the right colon, having migrated from its previous placement on 08/26/2014.  No displaced fracture.  IMPRESSION: Evidence of developing interstitial pulmonary edema.  No evidence of free air, however, multiple borderline dilated small bowel loops suggest ileus and/or developing small bowel obstruction. Further imaging is recommended, such as serial plain film until resolution of the bowel gas pattern, or alternatively CT to evaluate for acute abnormality.  Migration of previous biliary stent, potentially within the small bowel or right colon.  Previous surgical changes, as above.  Signed,  Dulcy Fanny. Earleen Newport, DO  [facility]  [facility]   Electronically Signed   By: Corrie Mckusick O.D.   On: 09/04/2014 15:53   Mr Abd W/wo Cm/mrcp  08/26/2014   CLINICAL DATA:  Evaluate for biliary tract obstruction.  EXAM: MRI ABDOMEN WITHOUT AND  WITH CONTRAST (INCLUDING MRCP)  TECHNIQUE: Multiplanar  multisequence MR imaging of the abdomen was performed both before and after the administration of intravenous contrast. Heavily T2-weighted images of the biliary and pancreatic ducts were obtained, and three-dimensional MRCP images were rendered by post processing.  CONTRAST:  2mL MULTIHANCE GADOBENATE DIMEGLUMINE 529 MG/ML IV SOLN  COMPARISON:  No priors. CT of the abdomen and pelvis without contrast 08/23/2014.  FINDINGS: Patient is status post cholecystectomy. MRCP images demonstrates moderate intra and extrahepatic biliary ductal dilatation. Common bile duct measures up to 10 mm distally, before it abruptly tapers to approximately 1 mm immediately adjacent to the level of the ampulla. This narrowing spans a length of approximately 1.4 cm, and there is mild soft tissue prominence around this narrowed area of the duct, which demonstrates very subtle differential enhancement compared to the remainder of the pancreatic parenchyma. While there is no definite pancreatic head mass, the possibility of a tiny ampullary lesion is difficult to entirely exclude; however, these findings are favored to reflect a distal ductal stricture. Additionally, the pancreatic duct is not dilated. On image 31 of series 5 there is a tiny filling defect in the distal common bile duct, however, this is favored to be related to flow as there are no filling defects in this region on any of the other pulse sequences (including MRCP images) to suggest underlying ductal stone.  No suspicious hepatic lesion noted. There is slight heterogeneous signal intensity in a body and tail of the pancreas on T2 weighted images, with a small amount of fluid in the adjacent retroperitoneal regions, suggesting mild or resolving pancreatitis. This amorphous retroperitoneal fluid extends into the paracolic gutters bilaterally. No discrete peripancreatic fluid collections are noted at this time. The appearance of the spleen and bilateral adrenal glands is  unremarkable. Multiple renal lesions are noted which are low T1 signal intensity, high T2 signal intensity, and do not enhance, compatible with simple cysts, largest of which measure up to 1.5 cm in diameter in the interpolar region of the right kidney.  Orthopedic fixation hardware in the visualized thoracolumbar spine resulting in artifact throughout this region. Small right pleural effusion layering dependently.  IMPRESSION: 1. The common bile duct is dilated to approximately 10 mm and there is moderate intrahepatic biliary ductal dilatation, indicative of biliary tract obstruction. This is associated with a long segment of narrowing over approximately 1.4 cm in the distal common bile duct immediately before the ampulla where the duct tapers to approximately 1 mm in diameter. In the region of ductal narrowing there is some prominence of the surrounding soft tissues, which demonstrate very subtle differential enhancement. It is difficult to discern whether or not this is simply a stricture, or if there is a small circumferential ampullary lesion in this region. Correlation with endoscopic ultrasound, ERCP and potential biopsy is recommended. 2. The appearance of the pancreas suggest either mild or resolving pancreatitis. No discrete peripancreatic fluid collection to suggest pseudocyst at this time. 3. Status post cholecystectomy. 4. Additional incidental findings, as above.   Electronically Signed   By: Vinnie Langton M.D.   On: 08/26/2014 08:41     PERTINENT LAB RESULTS: CBC:  Recent Labs  09/06/14 0929  WBC 10.4  HGB 10.7*  HCT 33.3*  PLT 305   CMET CMP     Component Value Date/Time   NA 142 09/07/2014 0642   K 3.8 09/07/2014 0642   CL 102 09/07/2014 0642   CO2 24 09/07/2014 0642   GLUCOSE  137* 09/07/2014 0642   BUN 14 09/07/2014 0642   CREATININE 0.79 09/07/2014 0642   CALCIUM 9.6 09/07/2014 0642   PROT 7.2 09/07/2014 0642   ALBUMIN 3.1* 09/07/2014 0642   AST 25 09/07/2014 0642   ALT 13  09/07/2014 0642   ALKPHOS 190* 09/07/2014 0642   BILITOT 2.1* 09/07/2014 0642   GFRNONAA 79* 09/07/2014 0642   GFRAA >90 09/07/2014 0642    GFR Estimated Creatinine Clearance: 50.5 ml/min (by C-G formula based on Cr of 0.79). No results found for this basename: LIPASE, AMYLASE,  in the last 72 hours No results found for this basename: CKTOTAL, CKMB, CKMBINDEX, TROPONINI,  in the last 72 hours No components found with this basename: POCBNP,  No results found for this basename: DDIMER,  in the last 72 hours No results found for this basename: HGBA1C,  in the last 72 hours No results found for this basename: CHOL, HDL, LDLCALC, TRIG, CHOLHDL, LDLDIRECT,  in the last 72 hours No results found for this basename: TSH, T4TOTAL, FREET3, T3FREE, THYROIDAB,  in the last 72 hours No results found for this basename: VITAMINB12, FOLATE, FERRITIN, TIBC, IRON, RETICCTPCT,  in the last 72 hours Coags: No results found for this basename: PT, INR,  in the last 72 hours Microbiology: No results found for this or any previous visit (from the past 240 hour(s)).   BRIEF HOSPITAL COURSE:   Principal Problem:  Mild ileus with nausea/vomiting  - Likely secondary to dislodged biliary stent  - Admitted and given laxatives, stent still in place inspite of numerous BM's following laxatives.  - Seen by gastroenterology, plans were to continue with laxatives to see if stent would pass, since it did not pass-colonoscopy done on 9/27 with stent retrieval. Doing well, eating and ambulating post colonoscopy-stable for discharge. Patient has appt with [facility] on 8/30 for EUS/ERCP.  Biliary stricture with migrated stent  -Bile duct brushings with Atypical cells, no malignancy noted on Path  -stent migrated-and removed by Colonoscopy on 8/30.LFT's stable, even with stent not in place, has appt with [facility] coming up in the next few days. - Per GI note-US with possible biliary endoscopy/restenting scheduled for this  coming Wednesday as outpatient. LFTs show decreasing bilirubin and alkaline phosphatase levels. Patient afebrile and without any abdominal pain this morning   Leukocytosis  -Unclear etiology- suspect secondary to stress margination  - Continue to monitor off antibiotics- remains on Flagyl for recent C. difficile colitis. Remains afebrile and nontoxic looking. No clinical signs/symptoms of cholangitis at this time  .  Acute Kidney Injury  - Likely pre-renal due to N/V and diuretics.  - Resolved with IV fluids   Mild Interstitial Pulm edema  -Asymptomatic,Will discontinue IV lasix, and saline lock fluids.  -Echo completed - shows grade 1 diastolic dysfunction with no wall motion abnormalities. EF 60-65%  -Follow I/Os, weights   Hypertension  - Controlled-Continue home regimen, except HCTZ & lasix   GERD (gastroesophageal reflux disease)  - Discontinue PPI-placed on Pepcid given C. difficile colitis   Anxiety  -Continue anxiolytics PRN   Clostridium difficile colitis  -No diarrhea at this time. Rather constipation.  -Complete Flagyl regimen, last day 9/28, based on DC summary   History of depression  - Resume Pristiq   Hypothyroidism  - Continue with levothyroxine  TODAY-DAY OF DISCHARGE:  Subjective:   Latangela Mccomas today has no headache,no chest abdominal pain,no new weakness tingling or numbness, feels much better wants to go home today.  Objective:   Blood pressure 148/57, pulse 85, temperature 98.2 F (36.8 C), temperature source Oral, resp. rate 22, height 5\' 1"  (1.549 m), weight 59.7 kg (131 lb 9.8 oz), SpO2 99.00%.  Intake/Output Summary (Last 24 hours) at 09/08/14 1349 Last data filed at 09/08/14 1251  Gross per 24 hour  Intake    697 ml  Output    100 ml  Net    597 ml   Filed Weights   09/04/14 1951 09/07/14 0415 09/08/14 0528  Weight: 61.5 kg (135 lb 9.3 oz) 58.605 kg (129 lb 3.2 oz) 59.7 kg (131 lb 9.8 oz)    Exam Awake Alert, Oriented *3, No new F.N  deficits, Normal affect [place].AT,PERRAL Supple Neck,No JVD, No cervical lymphadenopathy appriciated.  Symmetrical Chest wall movement, Good air movement bilaterally, CTAB RRR,No Gallops,Rubs or new Murmurs, No Parasternal Heave +ve B.Sounds, Abd Soft, Non tender, No organomegaly appriciated, No rebound -guarding or rigidity. No Cyanosis, Clubbing or edema, No new Rash or bruise  DISCHARGE CONDITION: Stable  DISPOSITION: Home  DISCHARGE INSTRUCTIONS:    Activity:  As tolerated   Diet recommendation: Heart Healthy diet  Discharge Instructions   Call MD for:  difficulty breathing, headache or visual disturbances    Complete by:  As directed      Call MD for:  extreme fatigue    Complete by:  As directed      Diet - low sodium heart healthy    Complete by:  As directed      Increase activity slowly    Complete by:  As directed            Follow-up Information   Follow up with Horton Finer, MD. Schedule an appointment as soon as possible for a visit in 1 week.   Specialty:  Internal Medicine   Contact information:   [address], [address] [place] [address] [phone]       Follow up with [facility], MD On 09/11/2014. (keep appt for EUS/Biliary Stent)    Specialty:  Gastroenterology   Contact information:   [address], [place] [address] [phone]      Total Time spent on discharge equals 45 minutes.  SignedOren Binet 09/08/2014 1:49 PM  **Disclaimer: This note may have been dictated with voice recognition software. Similar sounding words can inadvertently be transcribed and this note may contain transcription errors which may not have been corrected upon publication of note.**

## 2014-09-08 NOTE — Anesthesia Postprocedure Evaluation (Signed)
  Anesthesia Post-op Note  Patient: [name] [name]  Procedure(s) Performed: Procedure(s): COLONOSCOPY (Left)  Patient Location: PACU  Anesthesia Type:MAC  Level of Consciousness: awake, alert  and oriented  Airway and Oxygen Therapy: Patient Spontanous Breathing  Post-op Pain: none  Post-op Assessment: Post-op Vital signs reviewed. Called by PACU nurse for pt complaint of eye pain. Upon examining pt she states that her eye pain has resolved completely. Pt counseled on periop eye injuries and instructed to have primary team call anesthesia department if pain returns.   Post-op Vital Signs: Reviewed  Last Vitals:  Filed Vitals:   09/08/14 1130  BP:   Pulse:   Temp: 36.7 C  Resp:     Complications: No apparent anesthesia complications

## 2014-09-08 NOTE — Progress Notes (Signed)
09/08/14 Patient ambulated in hallway without difficulty, no SOB or fatigue.

## 2014-09-08 NOTE — Anesthesia Preprocedure Evaluation (Addendum)
Anesthesia Evaluation  Patient identified by MRN, date of birth, ID band Patient awake    Reviewed: Allergy & Precautions, H&P , NPO status , Patient's Chart, lab work & pertinent test results  History of Anesthesia Complications Negative for: history of anesthetic complications  Airway Mallampati: II TM Distance: >3 FB Neck ROM: Full    Dental  (+) Teeth Intact, Caps, Dental Advisory Given   Pulmonary neg pulmonary ROS,    Pulmonary exam normal       Cardiovascular hypertension, Pt. on medications     Neuro/Psych Anxiety negative neurological ROS     GI/Hepatic Neg liver ROS, hiatal hernia, GERD-  Medicated,  Endo/Other  Hypothyroidism   Renal/GU Renal diseasenegative Renal ROS     Musculoskeletal  (+) Arthritis -,   Abdominal   Peds  Hematology  (+) anemia ,   Anesthesia Other Findings   Reproductive/Obstetrics                         Anesthesia Physical  Anesthesia Plan  ASA: III  Anesthesia Plan: MAC   Post-op Pain Management:    Induction: Intravenous  Airway Management Planned:   Additional Equipment:   Intra-op Plan:   Post-operative Plan:   Informed Consent: I have reviewed the patients History and Physical, chart, labs and discussed the procedure including the risks, benefits and alternatives for the proposed anesthesia with the patient or authorized representative who has indicated his/her understanding and acceptance.   Dental advisory given  Plan Discussed with: CRNA, Anesthesiologist and Surgeon  Anesthesia Plan Comments:        Anesthesia Quick Evaluation

## 2014-09-08 NOTE — Transfer of Care (Signed)
Immediate Anesthesia Transfer of Care Note  Patient: [name] [name]  Procedure(s) Performed: Procedure(s): COLONOSCOPY (Left)  Patient Location: PACU  Anesthesia Type:MAC  Level of Consciousness: awake  Airway & Oxygen Therapy: Patient Spontanous Breathing and Patient connected to nasal cannula oxygen  Post-op Assessment: Report given to PACU RN and Post -op Vital signs reviewed and stable  Post vital signs: Reviewed and stable  Complications: No apparent anesthesia complications

## 2014-09-09 ENCOUNTER — Other Ambulatory Visit: Payer: Self-pay | Admitting: Gastroenterology

## 2014-09-09 ENCOUNTER — Encounter (HOSPITAL_COMMUNITY): Payer: Self-pay | Admitting: Gastroenterology

## 2014-09-09 NOTE — Addendum Note (Signed)
Addended by: Arta Silence on: 09/09/2014 06:31 PM   Modules accepted: Orders

## 2014-09-10 NOTE — Anesthesia Preprocedure Evaluation (Addendum)
Anesthesia Evaluation  Patient identified by MRN, date of birth, ID band Patient awake    Reviewed: Allergy & Precautions, H&P , NPO status , Patient's Chart, lab work & pertinent test results, reviewed documented beta blocker date and time   History of Anesthesia Complications Negative for: history of anesthetic complications  Airway Mallampati: II TM Distance: >3 FB Neck ROM: Limited    Dental no notable dental hx.    Pulmonary neg pulmonary ROS,  breath sounds clear to auscultation  Pulmonary exam normal       Cardiovascular Exercise Tolerance: Good hypertension, Pt. on medications and Pt. on home beta blockers Rhythm:Regular Rate:Normal     Neuro/Psych PSYCHIATRIC DISORDERS Anxiety negative neurological ROS     GI/Hepatic negative GI ROS, Neg liver ROS, GERD-  Medicated and Controlled,  Endo/Other  negative endocrine ROSHypothyroidism   Renal/GU Renal diseasenegative Renal ROS  negative genitourinary   Musculoskeletal negative musculoskeletal ROS (+) Arthritis -, Osteoarthritis,    Abdominal   Peds negative pediatric ROS (+)  Hematology negative hematology ROS (+) anemia ,   Anesthesia Other Findings   Reproductive/Obstetrics negative OB ROS                          Anesthesia Physical Anesthesia Plan  ASA: II  Anesthesia Plan: General   Post-op Pain Management:    Induction: Intravenous  Airway Management Planned: Oral ETT  Additional Equipment:   Intra-op Plan:   Post-operative Plan: Extubation in OR  Informed Consent: I have reviewed the patients History and Physical, chart, labs and discussed the procedure including the risks, benefits and alternatives for the proposed anesthesia with the patient or authorized representative who has indicated his/her understanding and acceptance.   Dental advisory given  Plan Discussed with: CRNA  Anesthesia Plan Comments:          Anesthesia Quick Evaluation

## 2014-09-11 ENCOUNTER — Encounter (HOSPITAL_COMMUNITY)
Admission: RE | Disposition: A | Discharge status: Home / Self Care | Payer: Self-pay | Source: Ambulatory Visit | Attending: Gastroenterology

## 2014-09-11 ENCOUNTER — Ambulatory Visit (HOSPITAL_COMMUNITY): Payer: Medicare Other

## 2014-09-11 ENCOUNTER — Other Ambulatory Visit: Payer: Self-pay | Admitting: Gastroenterology

## 2014-09-11 ENCOUNTER — Ambulatory Visit (HOSPITAL_COMMUNITY): Payer: Medicare Other | Admitting: Anesthesiology

## 2014-09-11 ENCOUNTER — Encounter (HOSPITAL_COMMUNITY): Payer: Medicare Other | Admitting: Anesthesiology

## 2014-09-11 ENCOUNTER — Ambulatory Visit (HOSPITAL_COMMUNITY)
Admission: RE | Admit: 2014-09-11 | Discharge: 2014-09-11 | Disposition: A | Discharge status: Home / Self Care | Payer: Medicare Other | Source: Ambulatory Visit | Attending: Gastroenterology | Admitting: Gastroenterology

## 2014-09-11 ENCOUNTER — Encounter (HOSPITAL_COMMUNITY): Payer: Self-pay

## 2014-09-11 DIAGNOSIS — K831 Obstruction of bile duct: Secondary | ICD-10-CM | POA: Insufficient documentation

## 2014-09-11 DIAGNOSIS — K838 Other specified diseases of biliary tract: Secondary | ICD-10-CM | POA: Diagnosis not present

## 2014-09-11 DIAGNOSIS — R932 Abnormal findings on diagnostic imaging of liver and biliary tract: Secondary | ICD-10-CM | POA: Diagnosis not present

## 2014-09-11 DIAGNOSIS — R7401 Elevation of levels of liver transaminase levels: Secondary | ICD-10-CM | POA: Diagnosis not present

## 2014-09-11 DIAGNOSIS — R7402 Elevation of levels of lactic acid dehydrogenase (LDH): Secondary | ICD-10-CM | POA: Diagnosis not present

## 2014-09-11 DIAGNOSIS — R17 Unspecified jaundice: Secondary | ICD-10-CM | POA: Diagnosis not present

## 2014-09-11 DIAGNOSIS — R933 Abnormal findings on diagnostic imaging of other parts of digestive tract: Secondary | ICD-10-CM | POA: Insufficient documentation

## 2014-09-11 HISTORY — PX: ERCP: SHX5425

## 2014-09-11 HISTORY — PX: EUS: SHX5427

## 2014-09-11 HISTORY — PX: SPYGLASS CHOLANGIOSCOPY: SHX5441

## 2014-09-11 LAB — COMPREHENSIVE METABOLIC PANEL
ALBUMIN: 3.6 g/dL (ref 3.5–5.2)
ALT: 13 U/L (ref 0–35)
AST: 21 U/L (ref 0–37)
Alkaline Phosphatase: 147 U/L — ABNORMAL HIGH (ref 39–117)
Anion gap: 16 — ABNORMAL HIGH (ref 5–15)
BUN: 35 mg/dL — ABNORMAL HIGH (ref 6–23)
CALCIUM: 9.2 mg/dL (ref 8.4–10.5)
CO2: 25 mEq/L (ref 19–32)
Chloride: 101 mEq/L (ref 96–112)
Creatinine, Ser: 0.97 mg/dL (ref 0.50–1.10)
GFR calc Af Amer: 65 mL/min — ABNORMAL LOW (ref >90)
GFR calc non Af Amer: 56 mL/min — ABNORMAL LOW (ref >90)
Glucose, Bld: 104 mg/dL — ABNORMAL HIGH (ref 70–99)
Potassium: 3.8 mEq/L (ref 3.7–5.3)
SODIUM: 142 meq/L (ref 137–147)
TOTAL PROTEIN: 7.8 g/dL (ref 6.0–8.3)
Total Bilirubin: 1.8 mg/dL — ABNORMAL HIGH (ref 0.3–1.2)

## 2014-09-11 SURGERY — ESOPHAGEAL ENDOSCOPIC ULTRASOUND (EUS) RADIAL
Anesthesia: General

## 2014-09-11 MED ORDER — PROPOFOL 10 MG/ML IV BOLUS
INTRAVENOUS | Status: AC
  Filled 2014-09-11: qty 20

## 2014-09-11 MED ORDER — GLUCAGON HCL RDNA (DIAGNOSTIC) 1 MG IJ SOLR
INTRAMUSCULAR | Status: AC
  Filled 2014-09-11: qty 2

## 2014-09-11 MED ORDER — SODIUM CHLORIDE 0.9 % IV SOLN
INTRAVENOUS | Status: DC | PRN
  Administered 2014-09-11: 15:00:00

## 2014-09-11 MED ORDER — FENTANYL CITRATE 0.05 MG/ML IJ SOLN
INTRAMUSCULAR | Status: DC | PRN
  Administered 2014-09-11 (×2): 50 ug via INTRAVENOUS

## 2014-09-11 MED ORDER — CIPROFLOXACIN IN D5W 400 MG/200ML IV SOLN
INTRAVENOUS | Status: AC
  Filled 2014-09-11: qty 200

## 2014-09-11 MED ORDER — SODIUM CHLORIDE 0.9 % IV SOLN
INTRAVENOUS | Status: DC
  Administered 2014-09-11: 12:00:00 via INTRAVENOUS

## 2014-09-11 MED ORDER — SODIUM CHLORIDE 0.9 % IV SOLN: INTRAVENOUS | Status: DC

## 2014-09-11 MED ORDER — FENTANYL CITRATE 0.05 MG/ML IJ SOLN
INTRAMUSCULAR | Status: AC
  Filled 2014-09-11: qty 2

## 2014-09-11 MED ORDER — PROPOFOL 10 MG/ML IV BOLUS
INTRAVENOUS | Status: DC | PRN
  Administered 2014-09-11: 150 mg via INTRAVENOUS

## 2014-09-11 MED ORDER — LIDOCAINE HCL (CARDIAC) 20 MG/ML IV SOLN
INTRAVENOUS | Status: AC
  Filled 2014-09-11: qty 5

## 2014-09-11 MED ORDER — LIDOCAINE HCL (CARDIAC) 20 MG/ML IV SOLN
INTRAVENOUS | Status: DC | PRN
Start: 2014-09-11 — End: 2014-09-11
  Administered 2014-09-11: 50 mg via INTRAVENOUS

## 2014-09-11 NOTE — Transfer of Care (Signed)
Immediate Anesthesia Transfer of Care Note  Patient: [name] [name]  Procedure(s) Performed: Procedure(s): ESOPHAGEAL ENDOSCOPIC ULTRASOUND (EUS) RADIAL (N/A) ENDOSCOPIC RETROGRADE CHOLANGIOPANCREATOGRAPHY (ERCP) (N/A) SPYGLASS CHOLANGIOSCOPY (N/A)  Patient Location: PACU  Anesthesia Type:General  Level of Consciousness: awake, alert  and oriented  Airway & Oxygen Therapy: Patient Spontanous Breathing and Patient connected to face mask oxygen  Post-op Assessment: Report given to PACU RN and Post -op Vital signs reviewed and stable  Post vital signs: Reviewed and stable  Complications: No apparent anesthesia complications

## 2014-09-11 NOTE — Anesthesia Postprocedure Evaluation (Signed)
  Anesthesia Post-op Note  Patient: [name] [name]  Procedure(s) Performed: Procedure(s) (LRB): ESOPHAGEAL ENDOSCOPIC ULTRASOUND (EUS) RADIAL (N/A) ENDOSCOPIC RETROGRADE CHOLANGIOPANCREATOGRAPHY (ERCP) (N/A) SPYGLASS CHOLANGIOSCOPY (N/A)  Patient Location: PACU  Anesthesia Type: General  Level of Consciousness: awake and alert   Airway and Oxygen Therapy: Patient Spontanous Breathing  Post-op Pain: mild  Post-op Assessment: Post-op Vital signs reviewed, Patient's Cardiovascular Status Stable, Respiratory Function Stable, Patent Airway and No signs of Nausea or vomiting  Last Vitals:  Filed Vitals:   09/11/14 1447  BP: 129/77  Pulse: 67  Temp: 36.6 C  Resp: 21    Post-op Vital Signs: stable   Complications: No apparent anesthesia complications

## 2014-09-11 NOTE — Discharge Instructions (Addendum)
Clear liquids only until 7 PM and if doing well may have soft solids and call sooner if any question or problem otherwise followup in one month to recheck symptoms and labs and make sure no further workup and plans needed and specifically call if any fever yellow jaundice nausea vomiting or increased abdominal pain   Endoscopic Retrograde Cholangiopancreatography (ERCP), Care After Refer to this sheet in the next few weeks. These instructions provide you with information on caring for yourself after your procedure. Your health care provider may also give you more specific instructions. Your treatment has been planned according to current medical practices, but problems sometimes occur. Call your health care provider if you have any problems or questions after your procedure.  WHAT TO EXPECT AFTER THE PROCEDURE  After your procedure, it is typical to feel:   Soreness in your throat.   Sick to your stomach (nauseous).   Bloated.  Dizzy.   Fatigued. HOME CARE INSTRUCTIONS  Have a friend or family member stay with you for the first 24 hours after your procedure.  Start taking your usual medicines and eating normally as soon as you feel well enough to do so or as directed by your health care provider. SEEK MEDICAL CARE IF:  You have abdominal pain.   You develop signs of infection, such as:   Chills.   Feeling unwell.  SEEK IMMEDIATE MEDICAL CARE IF:  You have difficulty swallowing.  You have worsening throat, chest, or abdominal pain.  You vomit.  You have bloody or very black stools.  You have a fever. Document Released: 09/19/2013 Document Reviewed: 09/19/2013 [facility] Patient Information 2015 [place], [place]. This information is not intended to replace advice given to you by your health care provider. Make sure you discuss any questions you have with your health care provider.   Esophagogastroduodenoscopy Care After Refer to this sheet in the next few weeks.  These instructions provide you with information on caring for yourself after your procedure. Your caregiver may also give you more specific instructions. Your treatment has been planned according to current medical practices, but problems sometimes occur. Call your caregiver if you have any problems or questions after your procedure.  HOME CARE INSTRUCTIONS  Do not eat or drink anything until the numbing medicine (local anesthetic) has worn off and your gag reflex has returned. You will know that the local anesthetic has worn off when you can swallow comfortably.  Do not drive for 12 hours after the procedure or as directed by your caregiver.  Only take medicines as directed by your caregiver. SEEK MEDICAL CARE IF:   You cannot stop coughing.  You are not urinating at all or less than usual. SEEK IMMEDIATE MEDICAL CARE IF:  You have difficulty swallowing.  You cannot eat or drink.  You have worsening throat or chest pain.  You have dizziness, lightheadedness, or you faint.  You have nausea or vomiting.  You have chills.  You have a fever.  You have severe abdominal pain.  You have black, tarry, or bloody stools. Document Released: 11/15/2012 Document Reviewed: 11/15/2012 [facility] Patient Information 2015 [place]. This information is not intended to replace advice given to you by your health care provider. Make sure you discuss any questions you have with your health care provider.

## 2014-09-11 NOTE — H&P (Signed)
Patient interval history reviewed.  Patient examined again.  There has been no change from documented H/P dated 09/09/14 (scanned into chart from our office) except as documented above.  Assessment:  1.  Bile duct stricture.  Plan:  1.  Endoscopic ultrasound with possible fine needle aspiration biopsies (FNA). 2.  Risks (bleeding, infection, bowel perforation that could require surgery, sedation-related changes in cardiopulmonary systems), benefits (identification and possible treatment of source of symptoms, exclusion of certain causes of symptoms), and alternatives (watchful waiting, radiographic imaging studies, empiric medical treatment) of upper endoscopy with ultrasound and possible fine needle aspiration (EUS +/- FNA) were explained to patient/family in detail and patient wishes to proceed. 3.  Endoscopic retrograde cholangiopancreatgraphy with possible cholangioscopy, possible stenting. 4.  Risks (up to and including bleeding, infection, perforation, pancreatitis that can be complicated by infected necrosis and death), benefits (removal of stones, alleviating blockage, decreasing risk of cholangitis or choledocholithiasis-related pancreatitis), and alternatives (watchful waiting, percutaneous transhepatic cholangiography) of ERCP were explained to patient/family in detail and patient elects to proceed.

## 2014-09-11 NOTE — Op Note (Signed)
[facility] [place], [address]   ENDOSCOPIC ULTRASOUND PROCEDURE REPORT  PATIENT: [name], [name]  MR#: [id number] BIRTHDATE: [birth year]  GENDER: female ENDOSCOPIST: Arta Silence, MD REFERRED BY:  Clarene Essex, M.D. PROCEDURE DATE:  09/11/2014 PROCEDURE:   Upper EUS ASA CLASS:      Class II INDICATIONS:   1.  bile duct stricture, elevated LFTs. MEDICATIONS: Per Anesthesia  DESCRIPTION OF PROCEDURE:   After the risks benefits and alternatives of the procedure were  explained, informed consent was obtained. The patient was then placed in the left, lateral, decubitus postion and IV sedation was administered. Throughout the procedure, the patients blood pressure, pulse and oxygen saturations were monitored continuously.  Under direct visualization, the radial forward-viewing echoendoscope was introduced through the mouth  and advanced to the second portion of the duodenum .  Water was used as necessary to provide an acoustic interface.  Upon completion of the imaging, water was removed and the patient was sent to the recovery room in satisfactory condition.    FINDINGS:      Prominent ampulla via EUS, measuring about 15 x 73mm in size, consistent with prior endoscopically prominent ampulla at time of her ERCP.  There was common bile duct dilatation about 12 mm proximal CBD and about 73mm within the intrapancreatic portion. There was some symmetrical thickening of distal bile duct of unclear significance.  No choledocholithiasis was seen.  A very small mass in this area is not completely excluded, but is deemed unlikely.  Pancreatic duct in the body and tail of pancreas is not dilated.  Few periportal triangular benign-appearing lymph nodes were identified.  IMPRESSION:     As above.  Small distal bile duct stricture favored. Prominent ampulla (and not distal bile duct mass) favored.  RECOMMENDATIONS:     1.  Watch for potential complications  of procedure. 2.  ERCP with cholangioscopy to be performed today by Dr. Watt Climes.    _______________________________ Lorrin MaisArta Silence, MD 09/11/2014 1:38 PM   CC:

## 2014-09-11 NOTE — Op Note (Signed)
[facility] [place], [address]   ERCP PROCEDURE REPORT  PATIENT: [name], [name]  MR# :[id number] BIRTHDATE: 1936-05-14  GENDER: female ENDOSCOPIST: Clarene Essex, MD REFERRED BY: PROCEDURE DATE:  09/11/2014 PROCEDURE:   ERCP with balloon dilation and ERCP with removal of calculus/calculi  i.e. balloon pull through  and Spyglas i.e. cholangioscopy ASA CLASS:    3 INDICATIONS: distal CBD stricture want to reevaluate MEDICATIONS:   Gen. anesthesia TOPICAL ANESTHETIC:  none  DESCRIPTION OF PROCEDURE:   After the risks benefits and alternatives of the procedure were thoroughly explained, informed consent was obtained.  The ercp pentax [id number]  endoscope was introduced through the mouth and advanced to the second portion of the duodenum .a normal appearing ampulla with the previous sphincterotomy was brought into view and using the adjustable stone balloon loaded with the Jagwire we initially had a minimal pancreatic injection and once we realized it was the pancreas the balloon and wire were repositioned and keep selective cannulation was obtained the rest of the procedure and we went ahead and injected dye which did reveal a distal tapering but the 12 mm balloon was easily pulled through the patent sphincterotomy site x2 without any obvious debris or sludge being seen we then went ahead then balloon dilated the sphincter using the 10 mm x 4 cm dilating balloon and once done we had excellent biliary drainage and were able to easily cannulated with the spyglass and distal CBD enteroscopy was done without obvious mass lesion or abnormality and we even remove the wire and was able to cannulate with just  the spyglass and we believe an adequate look was obtained and afterward we elected to remove the spyglass and the endoscope was removed after the stomach was suctioned and the patient tolerated the procedure well there was no obvious immediate  complication           COMPLICATIONS: none  ENDOSCOPIC IMPRESSION:#1 dilated CBD with tapered distal duct status post sphincter dilation and negative balloon pull-through x2 and negative spyglass as above 2. 1 minimal pancreatic duct injection only  RECOMMENDATIONS:observe for delayed complications if none followup in the office in one month to recheck symptoms and liver tests and make sure no further workup and plans are needed and call us sooner when necessary particularly if yellow jaundice returns     _______________________________ eSigned:  Clarene Essex, MD 09/11/2014 2:41 PM   CC:  PATIENT NAME:  [name], [name] MR#: [id number]

## 2014-09-11 NOTE — Addendum Note (Signed)
Addended byClarene Essex on: 09/11/2014 09:38 AM   Modules accepted: Orders

## 2014-09-11 NOTE — Progress Notes (Signed)
[name] [name] 1:34 PM  Subjective: Patient seen multiple times last week as an inpatient and doing fine as an outpt and her EUS results was discussed with my partner as well as observed by me  Objective: Vital signs stable afebrile exam please see pre-assessment evaluation labs pertinent for liver tests continue to decrease  Assessment: CBD strictrure  Plan: Will reevaluate the stricture with a ERCP and probable spyglass with further workup and plans pending those findings  [facility] E

## 2014-09-12 ENCOUNTER — Encounter (HOSPITAL_COMMUNITY): Payer: Self-pay | Admitting: Gastroenterology

## 2014-10-04 DIAGNOSIS — Z4789 Encounter for other orthopedic aftercare: Secondary | ICD-10-CM | POA: Diagnosis not present

## 2014-10-10 DIAGNOSIS — M545 Low back pain: Secondary | ICD-10-CM | POA: Diagnosis not present

## 2014-10-10 DIAGNOSIS — K830 Cholangitis: Secondary | ICD-10-CM | POA: Diagnosis not present

## 2014-10-10 DIAGNOSIS — I10 Essential (primary) hypertension: Secondary | ICD-10-CM | POA: Diagnosis not present

## 2014-10-10 DIAGNOSIS — R1310 Dysphagia, unspecified: Secondary | ICD-10-CM | POA: Diagnosis not present

## 2014-10-24 DIAGNOSIS — R1310 Dysphagia, unspecified: Secondary | ICD-10-CM | POA: Diagnosis not present

## 2014-10-24 DIAGNOSIS — K831 Obstruction of bile duct: Secondary | ICD-10-CM | POA: Diagnosis not present

## 2014-11-06 DIAGNOSIS — Z23 Encounter for immunization: Secondary | ICD-10-CM | POA: Diagnosis not present

## 2014-11-12 DIAGNOSIS — M25512 Pain in left shoulder: Secondary | ICD-10-CM | POA: Diagnosis not present

## 2015-02-05 DIAGNOSIS — M25512 Pain in left shoulder: Secondary | ICD-10-CM | POA: Diagnosis not present

## 2015-07-21 DIAGNOSIS — R110 Nausea: Secondary | ICD-10-CM | POA: Diagnosis not present

## 2015-07-21 DIAGNOSIS — I10 Essential (primary) hypertension: Secondary | ICD-10-CM | POA: Diagnosis not present

## 2015-07-21 DIAGNOSIS — F418 Other specified anxiety disorders: Secondary | ICD-10-CM | POA: Diagnosis not present

## 2015-07-21 DIAGNOSIS — N183 Chronic kidney disease, stage 3 (moderate): Secondary | ICD-10-CM | POA: Diagnosis not present

## 2015-07-21 DIAGNOSIS — E785 Hyperlipidemia, unspecified: Secondary | ICD-10-CM | POA: Diagnosis not present

## 2015-07-21 DIAGNOSIS — E039 Hypothyroidism, unspecified: Secondary | ICD-10-CM | POA: Diagnosis not present

## 2015-07-21 DIAGNOSIS — M545 Low back pain: Secondary | ICD-10-CM | POA: Diagnosis not present

## 2015-09-26 DIAGNOSIS — M25562 Pain in left knee: Secondary | ICD-10-CM | POA: Diagnosis not present

## 2016-01-09 DIAGNOSIS — M25562 Pain in left knee: Secondary | ICD-10-CM | POA: Diagnosis not present

## 2016-01-28 DIAGNOSIS — Z01818 Encounter for other preprocedural examination: Secondary | ICD-10-CM | POA: Diagnosis not present

## 2016-02-24 ENCOUNTER — Other Ambulatory Visit: Payer: Self-pay | Admitting: Physician Assistant

## 2016-02-24 DIAGNOSIS — M25562 Pain in left knee: Secondary | ICD-10-CM | POA: Diagnosis not present

## 2016-02-24 NOTE — H&P (Signed)
TOTAL KNEE ADMISSION H&P  Patient is being admitted for left total knee arthroplasty.  Subjective:  Chief Complaint:left knee pain.  HPI: [name] [name], 80 y.o. female, has a history of pain and functional disability in the left knee due to arthritis and has failed non-surgical conservative treatments for greater than 12 weeks to includeNSAID's and/or analgesics and corticosteriod injections.  Onset of symptoms was gradual, starting 1 years ago with gradually worsening course since that time. The patient noted no past surgery on the right knee(s).  Patient currently rates pain in the left knee(s) at 6 out of 10 with activity. Patient has night pain, worsening of pain with activity and weight bearing and pain that interferes with activities of daily living.  Patient has evidence of subchondral sclerosis and joint space narrowing by imaging studies. There is no active infection.  Patient Active Problem List   Diagnosis Date Noted  . 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 Ileus ([place]) 09/05/2014  . Leukocytosis, unspecified 09/05/2014  . Nausea and vomiting 09/04/2014  . Pulmonary edema 09/04/2014  . Hypokalemia 08/28/2014  . Enteritis due to Clostridium difficile 08/27/2014  . 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 Protein-calorie malnutrition, severe ([place]) 08/24/2014  . Obstructive jaundice 08/23/2014  . Acute cholangitis 08/23/2014  . ARF (acute renal failure) ([place]) 08/23/2014  . Coagulopathy ([place]) 08/23/2014  . Back pain   . Arthritis   . Hypertension   . GERD (gastroesophageal reflux disease)   . Anxiety   . IBS (irritable bowel syndrome)   . Hernia of Right posterior flank 04/18/2012   Past Medical History  Diagnosis Date  . Hypertension   . Hyperlipidemia   . Thyroid disease   . GERD (gastroesophageal reflux disease)   . Anxiety   . IBS (irritable bowel syndrome)   . Back pain   . Blood transfusion     at age of 4  . Hypothyroidism   . H/O hiatal hernia   .  Anemia     hx of years ago   . Arthritis     psoriatic arthritis  . ARF (acute renal failure) 08/23/2014  . Pancreatitis     Past Surgical History  Procedure Laterality Date  . Ventral hernia repair  06/08/2012    Procedure: LAPAROSCOPIC VENTRAL HERNIA;  Surgeon: Adin Hector, MD;  Location: [facility];  Service: General;  Laterality: Right;  . Back surgery  2010    thoractic-screws and rods  . Joint replacement  2005    left shoulder replacement   . Cholecystectomy  1988    lap  . Abdominal hysterectomy      partial  . Colonoscopy    . Ercp    . Orif clavicular fracture Left 07/18/2014    Procedure: OPEN REDUCTION INTERNAL FIXATION (ORIF) LEFT CLAVICULAR FRACTURE;  Surgeon: Ninetta Lights, MD;  Location: [facility];  Service: Orthopedics;  Laterality: Left;  . Ercp N/A 08/26/2014    Procedure: ENDOSCOPIC RETROGRADE CHOLANGIOPANCREATOGRAPHY (ERCP);  Surgeon: Jeryl Columbia, MD;  Location: [facility] ENDOSCOPY;  Service: Endoscopy;  Laterality: N/A;  . Tonsillectomy  as child  . Bile duct stent placement  08/26/2014  . Colonoscopy Left 09/08/2014    Procedure: COLONOSCOPY;  Surgeon: Arta Silence, MD;  Location: [facility] ENDOSCOPY;  Service: Endoscopy;  Laterality: Left;  . Eus N/A 09/11/2014    Procedure: ESOPHAGEAL ENDOSCOPIC ULTRASOUND (EUS) RADIAL;  Surgeon: Arta Silence, MD;  Location: [facility] ENDOSCOPY;  Service: Endoscopy;  Laterality: N/A;  .  Ercp N/A 09/11/2014    Procedure: ENDOSCOPIC RETROGRADE CHOLANGIOPANCREATOGRAPHY (ERCP);  Surgeon: Arta Silence, MD;  Location: [facility];  Service: Endoscopy;  Laterality: N/A;  . Spyglass cholangioscopy N/A 09/11/2014    Procedure: [id number] CHOLANGIOSCOPY;  Surgeon: Arta Silence, MD;  Location: [facility] ENDOSCOPY;  Service: Endoscopy;  Laterality: N/A;     (Not in a hospital admission) Allergies  Allergen Reactions  . Tetanus Toxoids Swelling  . Yellow Dyes (Non-Tartrazine) Itching and Other (See Comments)    Makes patient nervous      Social History  Substance Use Topics  . Smoking status: Never Smoker   . Smokeless tobacco: Never Used  . Alcohol Use: No     Comment: rare    Family History  Problem Relation Age of Onset  . Heart disease Mother   . Cancer Father     lung     Review of Systems  Constitutional: Negative.   HENT: Negative.   Eyes: Negative.   Respiratory: Negative.   Cardiovascular: Negative.   Gastrointestinal: Negative.   Genitourinary: Negative.   Musculoskeletal: Positive for joint pain.  Skin: Negative.   Neurological: Negative.   Endo/Heme/Allergies: Negative.   Psychiatric/Behavioral: Negative.     Objective:  Physical Exam  Constitutional: She is oriented to person, place, and time. She appears well-developed and well-nourished.  HENT:  Head: Normocephalic and atraumatic.  Eyes: EOM are normal. Pupils are equal, round, and reactive to light.  Neck: Normal range of motion. Neck supple.  Cardiovascular: Normal rate and regular rhythm.   Respiratory: Effort normal and breath sounds normal.  GI: Soft. Bowel sounds are normal.  Musculoskeletal:  Examination of her left knee reveals a trace effusion.  Range of motion 0-125 degrees.  Medial and lateral joint line tenderness.  Negative log roll.  Negative straight leg raise.  She is neurovascularly intact distally.    Neurological: She is alert and oriented to person, place, and time.  Skin: Skin is warm and dry.  Psychiatric: She has a normal mood and affect. Her behavior is normal. Judgment and thought content normal.    Vital signs in last 24 hours: @VSRANGES @  Labs:   Estimated body mass index is 24.88 kg/(m^2) as calculated from the following:   Height as of 09/04/14: 5\' 1"  (1.549 m).   Weight as of 09/04/14: 59.7 kg (131 lb 9.8 oz).   Imaging Review Plain radiographs demonstrate severe degenerative joint disease of the left knee(s). The overall alignment isneutral. The bone quality appears to be fair for age and  reported activity level.  Assessment/Plan:  End stage arthritis, left knee   The patient history, physical examination, clinical judgment of the provider and imaging studies are consistent with end stage degenerative joint disease of the left knee(s) and total knee arthroplasty is deemed medically necessary. The treatment options including medical management, injection therapy arthroscopy and arthroplasty were discussed at length. The risks and benefits of total knee arthroplasty were presented and reviewed. The risks due to aseptic loosening, infection, stiffness, patella tracking problems, thromboembolic complications and other imponderables were discussed. The patient acknowledged the explanation, agreed to proceed with the plan and consent was signed. Patient is being admitted for inpatient treatment for surgery, pain control, PT, OT, prophylactic antibiotics, VTE prophylaxis, progressive ambulation and ADL's and discharge planning. The patient is planning to be discharged home with home health services

## 2016-02-26 NOTE — Pre-Procedure Instructions (Signed)
[name] [name]  02/26/2016     Your procedure is scheduled on : Wednesday March 10, 2016 at 10:55 AM.  Report to [facility] Admitting at 8:55 AM.  Call this number if you have problems the morning of surgery: [phone]    Remember:  Do not eat food or drink liquids after midnight.  Take these medicines the morning of surgery with A SIP OF WATER : Acetaminophen (Tylenol) if needed, Alprazolam (Xanax) if needed, Atenolol (Tenormin), Hydrocodone if needed, Levothyroxine (Synthroid), Tramadol (Ultram) if needed, Venlafaxine (Effexor XR)   Stop taking any vitamins, herbal medications/supplements, NSAIDs, Ibuprofen, Advil, Motrin, Aleve, etc on Wednesday March 22nd   Do not wear jewelry, make-up or nail polish.  Do not wear lotions, powders, or perfumes.   Do not shave 48 hours prior to surgery.    Do not bring valuables to the hospital.  [facility] is not responsible for any belongings or valuables.  Contacts, dentures or bridgework may not be worn into surgery.  Leave your suitcase in the car.  After surgery it may be brought to your room.  For patients admitted to the hospital, discharge time will be determined by your treatment team.  Patients discharged the day of surgery will not be allowed to drive home.   Name and phone number of your driver:    Special instructions:  Shower using CHG soap the night before and the morning of your surgery  Please read over the following fact sheets that you were given. Pain Booklet, Coughing and Deep Breathing, Blood Transfusion Information, Total Joint Packet, MRSA Information and Surgical Site Infection Prevention

## 2016-02-27 ENCOUNTER — Encounter (HOSPITAL_COMMUNITY): Payer: Self-pay

## 2016-02-27 ENCOUNTER — Encounter (HOSPITAL_COMMUNITY)
Admission: RE | Admit: 2016-02-27 | Discharge: 2016-02-27 | Disposition: A | Discharge status: Home / Self Care | Payer: Medicare Other | Source: Ambulatory Visit | Attending: Orthopedic Surgery | Admitting: Orthopedic Surgery

## 2016-02-27 DIAGNOSIS — R9431 Abnormal electrocardiogram [ECG] [EKG]: Secondary | ICD-10-CM | POA: Insufficient documentation

## 2016-02-27 DIAGNOSIS — Z01812 Encounter for preprocedural laboratory examination: Secondary | ICD-10-CM | POA: Diagnosis not present

## 2016-02-27 DIAGNOSIS — M1712 Unilateral primary osteoarthritis, left knee: Secondary | ICD-10-CM | POA: Diagnosis not present

## 2016-02-27 DIAGNOSIS — Z79899 Other long term (current) drug therapy: Secondary | ICD-10-CM | POA: Insufficient documentation

## 2016-02-27 DIAGNOSIS — E079 Disorder of thyroid, unspecified: Secondary | ICD-10-CM | POA: Diagnosis not present

## 2016-02-27 DIAGNOSIS — Z0183 Encounter for blood typing: Secondary | ICD-10-CM | POA: Diagnosis not present

## 2016-02-27 DIAGNOSIS — Z01818 Encounter for other preprocedural examination: Secondary | ICD-10-CM | POA: Diagnosis not present

## 2016-02-27 DIAGNOSIS — I10 Essential (primary) hypertension: Secondary | ICD-10-CM | POA: Insufficient documentation

## 2016-02-27 DIAGNOSIS — E785 Hyperlipidemia, unspecified: Secondary | ICD-10-CM | POA: Insufficient documentation

## 2016-02-27 DIAGNOSIS — K219 Gastro-esophageal reflux disease without esophagitis: Secondary | ICD-10-CM | POA: Diagnosis not present

## 2016-02-27 HISTORY — DX: Depression, unspecified: F32.A

## 2016-02-27 HISTORY — DX: Other specified postprocedural states: R11.2

## 2016-02-27 HISTORY — DX: Other specified postprocedural states: Z98.890

## 2016-02-27 HISTORY — DX: Major depressive disorder, single episode, unspecified: F32.9

## 2016-02-27 LAB — CBC WITH DIFFERENTIAL/PLATELET
Basophils Absolute: 0 10*3/uL (ref 0.0–0.1)
Basophils Relative: 1 %
EOS PCT: 3 %
Eosinophils Absolute: 0.3 10*3/uL (ref 0.0–0.7)
HCT: 43.1 % (ref 36.0–46.0)
HEMOGLOBIN: 14.4 g/dL (ref 12.0–15.0)
LYMPHS ABS: 2.4 10*3/uL (ref 0.7–4.0)
LYMPHS PCT: 27 %
MCH: 30.1 pg (ref 26.0–34.0)
MCHC: 33.4 g/dL (ref 30.0–36.0)
MCV: 90.2 fL (ref 78.0–100.0)
MONOS PCT: 7 %
Monocytes Absolute: 0.7 10*3/uL (ref 0.1–1.0)
Neutro Abs: 5.5 10*3/uL (ref 1.7–7.7)
Neutrophils Relative %: 62 %
PLATELETS: 305 10*3/uL (ref 150–400)
RBC: 4.78 MIL/uL (ref 3.87–5.11)
RDW: 12.6 % (ref 11.5–15.5)
WBC: 8.9 10*3/uL (ref 4.0–10.5)

## 2016-02-27 LAB — ABO/RH: ABO/RH(D): A POS

## 2016-02-27 LAB — TYPE AND SCREEN
ABO/RH(D): A POS
Antibody Screen: NEGATIVE

## 2016-02-27 LAB — COMPREHENSIVE METABOLIC PANEL
ALK PHOS: 57 U/L (ref 38–126)
ALT: 18 U/L (ref 14–54)
ANION GAP: 16 — AB (ref 5–15)
AST: 22 U/L (ref 15–41)
Albumin: 4.2 g/dL (ref 3.5–5.0)
BUN: 58 mg/dL — ABNORMAL HIGH (ref 6–20)
CALCIUM: 10.5 mg/dL — AB (ref 8.9–10.3)
CO2: 25 mmol/L (ref 22–32)
CREATININE: 1.65 mg/dL — AB (ref 0.44–1.00)
Chloride: 100 mmol/L — ABNORMAL LOW (ref 101–111)
GFR, EST AFRICAN AMERICAN: 33 mL/min — AB (ref >60)
GFR, EST NON AFRICAN AMERICAN: 29 mL/min — AB (ref >60)
Glucose, Bld: 121 mg/dL — ABNORMAL HIGH (ref 65–99)
Potassium: 4.2 mmol/L (ref 3.5–5.1)
Sodium: 141 mmol/L (ref 135–145)
Total Bilirubin: 0.3 mg/dL (ref 0.3–1.2)
Total Protein: 7.4 g/dL (ref 6.5–8.1)

## 2016-02-27 LAB — APTT: aPTT: 30 seconds (ref 24–37)

## 2016-02-27 LAB — SURGICAL PCR SCREEN
MRSA, PCR: NEGATIVE
Staphylococcus aureus: NEGATIVE

## 2016-02-27 LAB — PROTIME-INR
INR: 1.04 (ref 0.00–1.49)
PROTHROMBIN TIME: 13.8 s (ref 11.6–15.2)

## 2016-02-29 LAB — URINE CULTURE

## 2016-03-01 NOTE — Progress Notes (Addendum)
Anesthesia Chart Review:  Pt is a 80 year old female scheduled for L total knee arthroplasty on 03/10/2016 with Dr. Maryla Morrow.   PCP is Dr. Amedeo Kinsman at [facility] at [facility].   PMH includes:  HTN, hyperlipidemia, thyroid disease, GERD, post-op N/V. Never smoker. BMI 27. S/p ERCP 09/11/14, 08/26/14. S/p ORIF L clavicle fx 07/18/14. S/p ventral hernia repair 06/08/12.   Medications include: atenolol, fenofibrate, lasix, levothyroxine, potassium  Preoperative labs reviewed.  Cr 1.65, BUN 58.   EKG 02/27/16: NSR. LAD. LVH with repolarization abnormality. Anteroseptal infarct , age undetermined.  Echo 09/05/14:  - Left ventricle: The cavity size was normal. Wall thickness was normal. Systolic function was normal. The estimated ejection fraction was in the range of 60% to 65%. Wall motion was normal; there were no regional wall motion abnormalities. There was an increased relative contribution of atrial contraction to ventricular filling. Doppler parameters are consistent with abnormal left ventricular relaxation (grade 1 diastolic dysfunction). - Aortic valve: Moderate diffuse thickening and calcification, consistent with sclerosis. - Mitral valve: There was mild regurgitation. - Tricuspid valve: There was trivial regurgitation.  Willeen Cass, FNP-BC [facility]/Anesthesiology Phone: [phone] 03/01/2016 4:46 PM   Addendum:  I faxed lab results to Dr. Amedeo Kinsman for review.  I spoke with Hinton Dyer, Dr. Corinne Ports assistant, and pt is ok to proceed with surgery as scheduled.   Willeen Cass, FNP-BC [facility] Stay Surgical [facility]/Anesthesiology Phone: [phone] 03/08/2016 4:43 PM

## 2016-03-09 MED ORDER — TRANEXAMIC ACID 1000 MG/10ML IV SOLN
1000.0000 mg | INTRAVENOUS | Status: AC
  Administered 2016-03-10: 1000 mg via INTRAVENOUS
  Filled 2016-03-09: qty 10

## 2016-03-09 MED ORDER — LACTATED RINGERS IV SOLN
INTRAVENOUS | Status: DC
  Administered 2016-03-10: 09:00:00 via INTRAVENOUS

## 2016-03-09 MED ORDER — CEFAZOLIN SODIUM-DEXTROSE 2-4 GM/100ML-% IV SOLN
2.0000 g | INTRAVENOUS | Status: AC
  Administered 2016-03-10: 2 g via INTRAVENOUS
  Filled 2016-03-09: qty 100

## 2016-03-10 ENCOUNTER — Inpatient Hospital Stay (HOSPITAL_COMMUNITY): Payer: Medicare Other | Admitting: Anesthesiology

## 2016-03-10 ENCOUNTER — Inpatient Hospital Stay (HOSPITAL_COMMUNITY): Payer: Medicare Other

## 2016-03-10 ENCOUNTER — Inpatient Hospital Stay (HOSPITAL_COMMUNITY)
Admission: RE | Admit: 2016-03-10 | Discharge: 2016-03-11 | DRG: 470 | Disposition: A | Discharge status: Home Health | Payer: Medicare Other | Source: Ambulatory Visit | Attending: Orthopedic Surgery | Admitting: Orthopedic Surgery

## 2016-03-10 ENCOUNTER — Encounter (HOSPITAL_COMMUNITY)
Admission: RE | Disposition: A | Discharge status: Home Health | Payer: Self-pay | Source: Ambulatory Visit | Attending: Orthopedic Surgery

## 2016-03-10 ENCOUNTER — Inpatient Hospital Stay (HOSPITAL_COMMUNITY): Payer: Medicare Other | Admitting: Emergency Medicine

## 2016-03-10 ENCOUNTER — Encounter (HOSPITAL_COMMUNITY): Payer: Self-pay | Admitting: Surgery

## 2016-03-10 DIAGNOSIS — M1712 Unilateral primary osteoarthritis, left knee: Principal | ICD-10-CM | POA: Diagnosis present

## 2016-03-10 DIAGNOSIS — E039 Hypothyroidism, unspecified: Secondary | ICD-10-CM | POA: Diagnosis present

## 2016-03-10 DIAGNOSIS — Z96612 Presence of left artificial shoulder joint: Secondary | ICD-10-CM | POA: Diagnosis present

## 2016-03-10 DIAGNOSIS — Z96659 Presence of unspecified artificial knee joint: Secondary | ICD-10-CM

## 2016-03-10 DIAGNOSIS — K219 Gastro-esophageal reflux disease without esophagitis: Secondary | ICD-10-CM | POA: Diagnosis present

## 2016-03-10 DIAGNOSIS — M179 Osteoarthritis of knee, unspecified: Secondary | ICD-10-CM | POA: Diagnosis not present

## 2016-03-10 DIAGNOSIS — Z471 Aftercare following joint replacement surgery: Secondary | ICD-10-CM | POA: Diagnosis not present

## 2016-03-10 DIAGNOSIS — E785 Hyperlipidemia, unspecified: Secondary | ICD-10-CM | POA: Diagnosis present

## 2016-03-10 DIAGNOSIS — F329 Major depressive disorder, single episode, unspecified: Secondary | ICD-10-CM | POA: Diagnosis present

## 2016-03-10 DIAGNOSIS — Z23 Encounter for immunization: Secondary | ICD-10-CM

## 2016-03-10 DIAGNOSIS — G8918 Other acute postprocedural pain: Secondary | ICD-10-CM | POA: Diagnosis not present

## 2016-03-10 DIAGNOSIS — Z96652 Presence of left artificial knee joint: Secondary | ICD-10-CM | POA: Diagnosis not present

## 2016-03-10 DIAGNOSIS — M25562 Pain in left knee: Secondary | ICD-10-CM | POA: Diagnosis not present

## 2016-03-10 DIAGNOSIS — I10 Essential (primary) hypertension: Secondary | ICD-10-CM | POA: Diagnosis present

## 2016-03-10 HISTORY — PX: TOTAL KNEE ARTHROPLASTY: SHX125

## 2016-03-10 SURGERY — ARTHROPLASTY, KNEE, TOTAL
Anesthesia: General | Site: Knee | Laterality: Left

## 2016-03-10 MED ORDER — ONDANSETRON HCL 4 MG PO TABS: 4.0000 mg | ORAL_TABLET | Freq: Three times a day (TID) | ORAL | Status: DC | PRN

## 2016-03-10 MED ORDER — ONDANSETRON HCL 4 MG/2ML IJ SOLN: 4.0000 mg | Freq: Once | INTRAMUSCULAR | Status: DC | PRN

## 2016-03-10 MED ORDER — BISACODYL 10 MG RE SUPP: 10.0000 mg | Freq: Every day | RECTAL | Status: DC | PRN

## 2016-03-10 MED ORDER — POTASSIUM CHLORIDE CRYS ER 20 MEQ PO TBCR
20.0000 meq | EXTENDED_RELEASE_TABLET | Freq: Every day | ORAL | Status: DC
  Administered 2016-03-11: 20 meq via ORAL
  Filled 2016-03-10 (×2): qty 1

## 2016-03-10 MED ORDER — METOPROLOL TARTRATE 1 MG/ML IV SOLN
INTRAVENOUS | Status: DC | PRN
  Administered 2016-03-10 (×2): 2 mg via INTRAVENOUS

## 2016-03-10 MED ORDER — DEXAMETHASONE SODIUM PHOSPHATE 4 MG/ML IJ SOLN
INTRAMUSCULAR | Status: AC
Start: 2016-03-10 — End: 2016-03-10
  Filled 2016-03-10: qty 1

## 2016-03-10 MED ORDER — ONDANSETRON HCL 4 MG/2ML IJ SOLN
INTRAMUSCULAR | Status: AC
  Filled 2016-03-10: qty 2

## 2016-03-10 MED ORDER — OXYCODONE-ACETAMINOPHEN 5-325 MG PO TABS: 1.0000 | ORAL_TABLET | ORAL | Status: DC | PRN

## 2016-03-10 MED ORDER — LIDOCAINE HCL (CARDIAC) 20 MG/ML IV SOLN
INTRAVENOUS | Status: DC | PRN
  Administered 2016-03-10: 50 mg via INTRAVENOUS

## 2016-03-10 MED ORDER — LACTATED RINGERS IV SOLN
INTRAVENOUS | Status: DC | PRN
  Administered 2016-03-10: 10:00:00 via INTRAVENOUS

## 2016-03-10 MED ORDER — ALUM & MAG HYDROXIDE-SIMETH 200-200-20 MG/5ML PO SUSP: 30.0000 mL | ORAL | Status: DC | PRN

## 2016-03-10 MED ORDER — POTASSIUM CHLORIDE IN NACL 20-0.9 MEQ/L-% IV SOLN
INTRAVENOUS | Status: DC
  Administered 2016-03-10 – 2016-03-11 (×2): via INTRAVENOUS
  Filled 2016-03-10 (×2): qty 1000

## 2016-03-10 MED ORDER — METOCLOPRAMIDE HCL 5 MG/ML IJ SOLN
INTRAMUSCULAR | Status: DC | PRN
  Administered 2016-03-10: 10 mg via INTRAVENOUS

## 2016-03-10 MED ORDER — ACETAMINOPHEN 650 MG RE SUPP: 650.0000 mg | Freq: Four times a day (QID) | RECTAL | Status: DC | PRN

## 2016-03-10 MED ORDER — PHENOL 1.4 % MT LIQD: 1.0000 | OROMUCOSAL | Status: DC | PRN

## 2016-03-10 MED ORDER — FENTANYL CITRATE (PF) 100 MCG/2ML IJ SOLN
INTRAMUSCULAR | Status: DC | PRN
  Administered 2016-03-10 (×4): 50 ug via INTRAVENOUS

## 2016-03-10 MED ORDER — DOCUSATE SODIUM 100 MG PO CAPS
100.0000 mg | ORAL_CAPSULE | Freq: Two times a day (BID) | ORAL | Status: DC
  Administered 2016-03-10 – 2016-03-11 (×2): 100 mg via ORAL
  Filled 2016-03-10 (×2): qty 1

## 2016-03-10 MED ORDER — BUPIVACAINE-EPINEPHRINE (PF) 0.5% -1:200000 IJ SOLN
INTRAMUSCULAR | Status: DC | PRN
  Administered 2016-03-10: 30 mL

## 2016-03-10 MED ORDER — METOCLOPRAMIDE HCL 5 MG/ML IJ SOLN
INTRAMUSCULAR | Status: AC
  Filled 2016-03-10: qty 2

## 2016-03-10 MED ORDER — VENLAFAXINE HCL ER 75 MG PO CP24
75.0000 mg | ORAL_CAPSULE | Freq: Two times a day (BID) | ORAL | Status: DC
  Administered 2016-03-10 – 2016-03-11 (×2): 75 mg via ORAL
  Filled 2016-03-10 (×2): qty 1

## 2016-03-10 MED ORDER — BUPIVACAINE HCL (PF) 0.5 % IJ SOLN
INTRAMUSCULAR | Status: AC
  Filled 2016-03-10: qty 30

## 2016-03-10 MED ORDER — BUPIVACAINE LIPOSOME 1.3 % IJ SUSP
20.0000 mL | INTRAMUSCULAR | Status: AC
  Administered 2016-03-10: 20 mL
  Filled 2016-03-10: qty 20

## 2016-03-10 MED ORDER — ALPRAZOLAM 0.5 MG PO TABS
0.5000 mg | ORAL_TABLET | Freq: Two times a day (BID) | ORAL | Status: DC | PRN
  Administered 2016-03-10 – 2016-03-11 (×2): 0.5 mg via ORAL
  Filled 2016-03-10 (×2): qty 1

## 2016-03-10 MED ORDER — BUPIVACAINE HCL 0.5 % IJ SOLN
INTRAMUSCULAR | Status: DC | PRN
  Administered 2016-03-10: 30 mL

## 2016-03-10 MED ORDER — FENTANYL CITRATE (PF) 250 MCG/5ML IJ SOLN
INTRAMUSCULAR | Status: AC
  Filled 2016-03-10: qty 5

## 2016-03-10 MED ORDER — CHLORHEXIDINE GLUCONATE 4 % EX LIQD: 60.0000 mL | Freq: Once | CUTANEOUS | Status: DC

## 2016-03-10 MED ORDER — ONDANSETRON HCL 4 MG PO TABS: 4.0000 mg | ORAL_TABLET | Freq: Four times a day (QID) | ORAL | Status: DC | PRN

## 2016-03-10 MED ORDER — SODIUM CHLORIDE 0.9 % IJ SOLN
INTRAMUSCULAR | Status: DC | PRN
  Administered 2016-03-10 (×4): 10 mL via INTRAVENOUS

## 2016-03-10 MED ORDER — DIPHENHYDRAMINE HCL 12.5 MG/5ML PO ELIX: 12.5000 mg | ORAL_SOLUTION | ORAL | Status: DC | PRN

## 2016-03-10 MED ORDER — OXYCODONE HCL 5 MG PO TABS
5.0000 mg | ORAL_TABLET | ORAL | Status: DC | PRN
  Administered 2016-03-10 – 2016-03-11 (×3): 10 mg via ORAL
  Filled 2016-03-10 (×4): qty 2

## 2016-03-10 MED ORDER — SODIUM CHLORIDE 0.9 % IR SOLN
Status: DC | PRN
  Administered 2016-03-10 (×2): 1000 mL

## 2016-03-10 MED ORDER — PROPOFOL 10 MG/ML IV BOLUS
INTRAVENOUS | Status: DC | PRN
  Administered 2016-03-10: 140 mg via INTRAVENOUS
  Administered 2016-03-10: 30 mg via INTRAVENOUS

## 2016-03-10 MED ORDER — POLYETHYLENE GLYCOL 3350 17 G PO PACK: 17.0000 g | PACK | Freq: Every day | ORAL | Status: DC | PRN

## 2016-03-10 MED ORDER — APIXABAN 2.5 MG PO TABS
2.5000 mg | ORAL_TABLET | Freq: Two times a day (BID) | ORAL | Status: DC
  Administered 2016-03-11: 2.5 mg via ORAL
  Filled 2016-03-10: qty 1

## 2016-03-10 MED ORDER — 0.9 % SODIUM CHLORIDE (POUR BTL) OPTIME
TOPICAL | Status: DC | PRN
  Administered 2016-03-10: 1000 mL

## 2016-03-10 MED ORDER — DIPHENHYDRAMINE HCL 50 MG/ML IJ SOLN
INTRAMUSCULAR | Status: DC | PRN
  Administered 2016-03-10: 6.25 mg via INTRAVENOUS

## 2016-03-10 MED ORDER — ONDANSETRON HCL 4 MG/2ML IJ SOLN
INTRAMUSCULAR | Status: DC | PRN
  Administered 2016-03-10: 4 mg via INTRAVENOUS

## 2016-03-10 MED ORDER — PROPOFOL 10 MG/ML IV BOLUS
INTRAVENOUS | Status: AC
  Filled 2016-03-10: qty 20

## 2016-03-10 MED ORDER — HYOSCYAMINE SULFATE ER 0.375 MG PO TB12
0.3750 mg | ORAL_TABLET | Freq: Two times a day (BID) | ORAL | Status: DC | PRN
  Administered 2016-03-10 – 2016-03-11 (×2): 0.375 mg via ORAL
  Filled 2016-03-10 (×3): qty 1

## 2016-03-10 MED ORDER — APIXABAN 2.5 MG PO TABS: ORAL_TABLET | ORAL | Status: DC

## 2016-03-10 MED ORDER — ACETAMINOPHEN 325 MG PO TABS: 650.0000 mg | ORAL_TABLET | Freq: Four times a day (QID) | ORAL | Status: DC | PRN

## 2016-03-10 MED ORDER — FENTANYL CITRATE (PF) 100 MCG/2ML IJ SOLN
INTRAMUSCULAR | Status: AC
  Filled 2016-03-10: qty 2

## 2016-03-10 MED ORDER — ZOLPIDEM TARTRATE 5 MG PO TABS
5.0000 mg | ORAL_TABLET | Freq: Every day | ORAL | Status: DC
  Administered 2016-03-10: 5 mg via ORAL
  Filled 2016-03-10: qty 1

## 2016-03-10 MED ORDER — INFLUENZA VAC SPLIT QUAD 0.5 ML IM SUSY
0.5000 mL | PREFILLED_SYRINGE | INTRAMUSCULAR | Status: AC
  Administered 2016-03-11: 0.5 mL via INTRAMUSCULAR
  Filled 2016-03-10: qty 0.5

## 2016-03-10 MED ORDER — METOCLOPRAMIDE HCL 5 MG/ML IJ SOLN
5.0000 mg | Freq: Three times a day (TID) | INTRAMUSCULAR | Status: DC | PRN
Start: 2016-03-10 — End: 2016-03-11

## 2016-03-10 MED ORDER — MENTHOL 3 MG MT LOZG: 1.0000 | LOZENGE | OROMUCOSAL | Status: DC | PRN

## 2016-03-10 MED ORDER — METOCLOPRAMIDE HCL 5 MG PO TABS: 5.0000 mg | ORAL_TABLET | Freq: Three times a day (TID) | ORAL | Status: DC | PRN

## 2016-03-10 MED ORDER — LEVOTHYROXINE SODIUM 100 MCG PO TABS
100.0000 ug | ORAL_TABLET | Freq: Every day | ORAL | Status: DC
  Administered 2016-03-11: 100 ug via ORAL
  Filled 2016-03-10: qty 1

## 2016-03-10 MED ORDER — ZOLPIDEM TARTRATE 5 MG PO TABS: 10.0000 mg | ORAL_TABLET | Freq: Every day | ORAL | Status: DC

## 2016-03-10 MED ORDER — ATENOLOL 50 MG PO TABS
50.0000 mg | ORAL_TABLET | Freq: Every morning | ORAL | Status: DC
  Administered 2016-03-11: 50 mg via ORAL
  Filled 2016-03-10: qty 1

## 2016-03-10 MED ORDER — FENOFIBRATE 54 MG PO TABS
54.0000 mg | ORAL_TABLET | Freq: Every day | ORAL | Status: DC
  Administered 2016-03-10 – 2016-03-11 (×2): 54 mg via ORAL
  Filled 2016-03-10 (×4): qty 1

## 2016-03-10 MED ORDER — DEXAMETHASONE SODIUM PHOSPHATE 4 MG/ML IJ SOLN
INTRAMUSCULAR | Status: DC | PRN
  Administered 2016-03-10: 4 mg via INTRAVENOUS

## 2016-03-10 MED ORDER — CEFAZOLIN SODIUM-DEXTROSE 2-4 GM/100ML-% IV SOLN
2.0000 g | Freq: Two times a day (BID) | INTRAVENOUS | Status: AC
  Administered 2016-03-10: 2 g via INTRAVENOUS
  Filled 2016-03-10: qty 100

## 2016-03-10 MED ORDER — DIPHENHYDRAMINE HCL 50 MG/ML IJ SOLN
INTRAMUSCULAR | Status: AC
  Filled 2016-03-10: qty 1

## 2016-03-10 MED ORDER — ONDANSETRON HCL 4 MG/2ML IJ SOLN: 4.0000 mg | Freq: Four times a day (QID) | INTRAMUSCULAR | Status: DC | PRN

## 2016-03-10 MED ORDER — MIDAZOLAM HCL 2 MG/2ML IJ SOLN
INTRAMUSCULAR | Status: AC
  Filled 2016-03-10: qty 2

## 2016-03-10 MED ORDER — FENTANYL CITRATE (PF) 100 MCG/2ML IJ SOLN
25.0000 ug | INTRAMUSCULAR | Status: DC | PRN
  Administered 2016-03-10 (×3): 50 ug via INTRAVENOUS

## 2016-03-10 MED ORDER — FUROSEMIDE 20 MG PO TABS
20.0000 mg | ORAL_TABLET | Freq: Every day | ORAL | Status: DC
  Filled 2016-03-10: qty 1

## 2016-03-10 MED ORDER — MAGNESIUM CITRATE PO SOLN: 1.0000 | Freq: Once | ORAL | Status: DC | PRN

## 2016-03-10 SURGICAL SUPPLY — 72 items
APL SKNCLS STERI-STRIP NONHPOA (GAUZE/BANDAGES/DRESSINGS) ×1
BANDAGE ACE 6X5 VEL STRL LF (GAUZE/BANDAGES/DRESSINGS) ×2 IMPLANT
BANDAGE ELASTIC 4 VELCRO ST LF (GAUZE/BANDAGES/DRESSINGS) ×3 IMPLANT
BANDAGE ELASTIC 6 VELCRO ST LF (GAUZE/BANDAGES/DRESSINGS) ×3 IMPLANT
BANDAGE ESMARK 6X9 LF (GAUZE/BANDAGES/DRESSINGS) ×1 IMPLANT
BENZOIN TINCTURE PRP APPL 2/3 (GAUZE/BANDAGES/DRESSINGS) ×3 IMPLANT
BLADE SAG 18X100X1.27 (BLADE) ×6 IMPLANT
BNDG CMPR 9X6 STRL LF SNTH (GAUZE/BANDAGES/DRESSINGS) ×1
BNDG ESMARK 6X9 LF (GAUZE/BANDAGES/DRESSINGS) ×3
BOWL SMART MIX CTS (DISPOSABLE) ×3 IMPLANT
CAPT KNEE TOTAL 3 ×2 IMPLANT
CEMENT BONE SIMPLEX SPEEDSET (Cement) ×6 IMPLANT
CLOSURE STERI-STRIP 1/2X4 (GAUZE/BANDAGES/DRESSINGS) ×1
CLOSURE WOUND 1/2 X4 (GAUZE/BANDAGES/DRESSINGS) ×2
CLSR STERI-STRIP ANTIMIC 1/2X4 (GAUZE/BANDAGES/DRESSINGS) ×1 IMPLANT
COVER SURGICAL LIGHT HANDLE (MISCELLANEOUS) ×3 IMPLANT
CUFF TOURNIQUET SINGLE 34IN LL (TOURNIQUET CUFF) ×3 IMPLANT
DRAPE EXTREMITY T 121X128X90 (DRAPE) ×3 IMPLANT
DRAPE IMP U-DRAPE 54X76 (DRAPES) ×3 IMPLANT
DRAPE PROXIMA HALF (DRAPES) ×3 IMPLANT
DRAPE U-SHAPE 47X51 STRL (DRAPES) ×3 IMPLANT
DRSG PAD ABDOMINAL 8X10 ST (GAUZE/BANDAGES/DRESSINGS) ×3 IMPLANT
DURAPREP 26ML APPLICATOR (WOUND CARE) ×2 IMPLANT
ELECT CAUTERY BLADE 6.4 (BLADE) ×3 IMPLANT
ELECT REM PT RETURN 9FT ADLT (ELECTROSURGICAL) ×3
ELECTRODE REM PT RTRN 9FT ADLT (ELECTROSURGICAL) ×1 IMPLANT
EVACUATOR 1/8 PVC DRAIN (DRAIN) ×3 IMPLANT
FACESHIELD WRAPAROUND (MASK) ×6 IMPLANT
FACESHIELD WRAPAROUND OR TEAM (MASK) ×2 IMPLANT
GAUZE SPONGE 4X4 12PLY STRL (GAUZE/BANDAGES/DRESSINGS) ×3 IMPLANT
GLOVE BIOGEL PI IND STRL 7.0 (GLOVE) ×1 IMPLANT
GLOVE BIOGEL PI INDICATOR 7.0 (GLOVE) ×2
GLOVE ORTHO TXT STRL SZ7.5 (GLOVE) ×3 IMPLANT
GLOVE SURG ORTHO 7.0 STRL STRW (GLOVE) ×3 IMPLANT
GOWN STRL REUS W/ TWL LRG LVL3 (GOWN DISPOSABLE) ×2 IMPLANT
GOWN STRL REUS W/ TWL XL LVL3 (GOWN DISPOSABLE) ×1 IMPLANT
GOWN STRL REUS W/TWL LRG LVL3 (GOWN DISPOSABLE) ×6
GOWN STRL REUS W/TWL XL LVL3 (GOWN DISPOSABLE) ×3
HANDPIECE INTERPULSE COAX TIP (DISPOSABLE) ×3
IMMOBILIZER KNEE 22 UNIV (SOFTGOODS) ×3 IMPLANT
IMMOBILIZER KNEE 24 THIGH 36 (MISCELLANEOUS) IMPLANT
IMMOBILIZER KNEE 24 UNIV (MISCELLANEOUS)
KIT BASIN OR (CUSTOM PROCEDURE TRAY) ×3 IMPLANT
KIT ROOM TURNOVER OR (KITS) ×3 IMPLANT
MANIFOLD NEPTUNE II (INSTRUMENTS) ×3 IMPLANT
NDL 18GX1X1/2 (RX/OR ONLY) (NEEDLE) ×1 IMPLANT
NDL HYPO 25GX1X1/2 BEV (NEEDLE) ×1 IMPLANT
NEEDLE 18GX1X1/2 (RX/OR ONLY) (NEEDLE) ×3 IMPLANT
NEEDLE HYPO 25GX1X1/2 BEV (NEEDLE) ×3 IMPLANT
NS IRRIG 1000ML POUR BTL (IV SOLUTION) ×3 IMPLANT
PACK TOTAL JOINT (CUSTOM PROCEDURE TRAY) ×3 IMPLANT
PACK UNIVERSAL I (CUSTOM PROCEDURE TRAY) ×3 IMPLANT
PAD ARMBOARD 7.5X6 YLW CONV (MISCELLANEOUS) ×4 IMPLANT
PAD CAST 4YDX4 CTTN HI CHSV (CAST SUPPLIES) ×1 IMPLANT
PADDING CAST COTTON 4X4 STRL (CAST SUPPLIES) ×3
SET HNDPC FAN SPRY TIP SCT (DISPOSABLE) ×1 IMPLANT
SPONGE GAUZE 4X4 12PLY STER LF (GAUZE/BANDAGES/DRESSINGS) ×2 IMPLANT
STRIP CLOSURE SKIN 1/2X4 (GAUZE/BANDAGES/DRESSINGS) ×4 IMPLANT
SUCTION FRAZIER HANDLE 10FR (MISCELLANEOUS) ×2
SUCTION TUBE FRAZIER 10FR DISP (MISCELLANEOUS) ×1 IMPLANT
SUT MNCRL AB 4-0 PS2 18 (SUTURE) ×3 IMPLANT
SUT VIC AB 0 CT1 27 (SUTURE)
SUT VIC AB 0 CT1 27XBRD ANBCTR (SUTURE) IMPLANT
SUT VIC AB 1 CTX 36 (SUTURE) ×3
SUT VIC AB 1 CTX36XBRD ANBCTR (SUTURE) ×1 IMPLANT
SUT VIC AB 2-0 CT1 27 (SUTURE) ×6
SUT VIC AB 2-0 CT1 TAPERPNT 27 (SUTURE) ×2 IMPLANT
SYR 50ML LL SCALE MARK (SYRINGE) ×3 IMPLANT
SYR CONTROL 10ML LL (SYRINGE) ×3 IMPLANT
TOWEL OR 17X24 6PK STRL BLUE (TOWEL DISPOSABLE) ×3 IMPLANT
TOWEL OR 17X26 10 PK STRL BLUE (TOWEL DISPOSABLE) ×3 IMPLANT
WATER STERILE IRR 1000ML POUR (IV SOLUTION) ×1 IMPLANT

## 2016-03-10 NOTE — Op Note (Signed)
[name] [name], IMGRUND NO.:  [id number]  MEDICAL RECORD NO.:  [id number]  LOCATION:  [facility]                         FACILITY:  [facility]  PHYSICIAN:  Ninetta Lights, M.D. DATE OF BIRTH:  1936/09/26  DATE OF PROCEDURE: DATE OF DISCHARGE:                              OPERATIVE REPORT   PREOPERATIVE DIAGNOSIS:  Left knee end-stage arthritis, primary localized.  Valgus alignment with bone loss, lateral compartment.  POSTOPERATIVE DIAGNOSIS:  Left knee end-stage arthritis, primary localized.  Valgus alignment with bone loss, lateral compartment.  PROCEDURE:  Left knee modified minimally invasive total knee replacement Stryker Triathlon prosthesis.  Cemented pegged cruciate retaining #2 femoral component.  Cemented #3 tibial component, 9 mm CS insert. Cemented resurfacing pegged medial offset 32-mm patellar component.  SURGEON:  Ninetta Lights, MD  ASSISTANT:  Elmyra Ricks, PA, present throughout the entire case and necessary for timely completion of procedure.  ANESTHESIA:  General.  BLOOD LOSS:  Minimal.  SPECIMENS:  None.  CULTURES:  None.  COMPLICATIONS:  None.  DRESSINGS:  Soft compressive knee immobilizer.  TOURNIQUET TIME:  45 minutes.  PROCEDURE IN DETAIL:  Patient was brought to operating room and after adequate anesthesia had been obtained, tourniquet applied, prepped and draped in usual sterile fashion.  Exsanguinated with elevation of Esmarch.  Tourniquet inflated to 300 mmHg.  Valgus alignment correctable.  Longitudinal incision above the patella down to the tibial tubercle.  Medial arthrotomy, vastus splitting.  Intramedullary guide distal femur.  8 mm resection 5 degrees of valgus.  Using epicondylar axis, the femur was sized, cut, and fitted for a pegged cruciate retaining #2 component.  Proximal tibial resection with extramedullary guide.  Size #3 component.  Rotation was set with trials.  With the 9 mm CS insert, pleased with  balancing, alignment, motion, and stability. Patella exposed.  Posterior 10 mm removed, drilled, sized, and fitted for a 32-mm patellar component.  Excellent tracking with trials in place.  All trials removed.  Copious irrigation with pulse irrigating device.  Cement prepared, placed on all components, firmly seated. Polyethylene attached to tibia.  Knee reduced.  Patella held with a clamp.  Once the cement hardened, the knee was irrigated once again. Soft tissues injected with Exparel.  Arthrotomy closed with #1 Vicryl. Subcutaneous and subcuticular closure.  Margins were injected with Marcaine.  Sterile compressive dressing applied.  Tourniquet deflated and removed.  Knee immobilizer applied.  Anesthesia reversed.  Brought to the recovery room.  Tolerated the surgery well.  No complications.     Ninetta Lights, M.D.     DFM/MEDQ  D:  03/10/2016  T:  03/10/2016  Job:  [id number]

## 2016-03-10 NOTE — Anesthesia Preprocedure Evaluation (Signed)
Anesthesia Evaluation  Patient identified by MRN, date of birth, ID band Patient awake    Reviewed: Allergy & Precautions, NPO status , Patient's Chart, lab work & pertinent test results  History of Anesthesia Complications (+) PONV and history of anesthetic complications  Airway Mallampati: III  TM Distance: >3 FB Neck ROM: Full    Dental no notable dental hx. (+) Dental Advisory Given   Pulmonary neg pulmonary ROS,    Pulmonary exam normal breath sounds clear to auscultation       Cardiovascular hypertension, Pt. on medications Normal cardiovascular exam Rhythm:Regular Rate:Normal     Neuro/Psych PSYCHIATRIC DISORDERS Anxiety Depression negative neurological ROS     GI/Hepatic Neg liver ROS, GERD  Medicated and Controlled,  Endo/Other  Hypothyroidism   Renal/GU negative Renal ROS  negative genitourinary   Musculoskeletal  (+) Arthritis , Osteoarthritis,    Abdominal   Peds negative pediatric ROS (+)  Hematology negative hematology ROS (+)   Anesthesia Other Findings   Reproductive/Obstetrics negative OB ROS                             Anesthesia Physical Anesthesia Plan  ASA: II  Anesthesia Plan: General   Post-op Pain Management: GA combined w/ Regional for post-op pain   Induction: Intravenous  Airway Management Planned: Oral ETT  Additional Equipment:   Intra-op Plan:   Post-operative Plan: Extubation in OR  Informed Consent: I have reviewed the patients History and Physical, chart, labs and discussed the procedure including the risks, benefits and alternatives for the proposed anesthesia with the patient or authorized representative who has indicated his/her understanding and acceptance.   Dental advisory given  Plan Discussed with: CRNA  Anesthesia Plan Comments:         Anesthesia Quick Evaluation

## 2016-03-10 NOTE — Progress Notes (Signed)
Orthopedic Tech Progress Note Patient Details:  [name] [name] 01-31-1936 [id number]  CPM Left Knee CPM Left Knee: On Left Knee Flexion (Degrees): 90 Left Knee Extension (Degrees): 0 Pt unable to use trapeze bar patient helper; RN notified and RN agrees   Sharlet Salina, Sharlette Jansma 03/10/2016, 12:20 PM

## 2016-03-10 NOTE — Progress Notes (Signed)
Utilization review completed.  

## 2016-03-10 NOTE — Transfer of Care (Signed)
Immediate Anesthesia Transfer of Care Note  Patient: [name] [name]  Procedure(s) Performed: Procedure(s): LEFT TOTAL KNEE ARTHROPLASTY (Left)  Patient Location: PACU  Anesthesia Type:General  Level of Consciousness: awake  Airway & Oxygen Therapy: Patient Spontanous Breathing and Patient connected to nasal cannula oxygen  Post-op Assessment: Report given to RN and Post -op Vital signs reviewed and stable  Post vital signs: Reviewed and stable  Last Vitals:  Filed Vitals:   03/10/16 0818 03/10/16 0934  BP:  188/64  Pulse:  54  Temp: 36.9 C   Resp:  16    Complications: No apparent anesthesia complications

## 2016-03-10 NOTE — Anesthesia Procedure Notes (Addendum)
Anesthesia Regional Block:  Adductor canal block  Pre-Anesthetic Checklist: ,, timeout performed, Correct Patient, Correct Site, Correct Laterality, Correct Procedure, Correct Position, site marked, Risks and benefits discussed,  Surgical consent,  Pre-op evaluation,  At surgeon's request and post-op pain management  Laterality: Left  Prep: Maximum Sterile Barrier Precautions used and chloraprep       Needles:  Injection technique: Single-shot  Needle Type: Echogenic Stimulator Needle     Needle Length: 10cm 10 cm Needle Gauge: 21 and 21 G    Additional Needles:  Procedures: ultrasound guided (picture in chart) and nerve stimulator Adductor canal block Narrative:  Injection made incrementally with aspirations every 5 mL.  Performed by: Personally   Additional Notes: Patient tolerated the procedure well without complications   Procedure Name: LMA Insertion Date/Time: 03/10/2016 9:53 AM Performed by: Manus Gunning, Sena Hoopingarner J Pre-anesthesia Checklist: Patient identified, Timeout performed, Emergency Drugs available, Suction available and Patient being monitored Patient Re-evaluated:Patient Re-evaluated prior to inductionOxygen Delivery Method: Circle system utilized Preoxygenation: Pre-oxygenation with 100% oxygen Intubation Type: IV induction Ventilation: Mask ventilation without difficulty LMA: LMA inserted LMA Size: 4.0 Number of attempts: 1 Placement Confirmation: positive ETCO2 and breath sounds checked- equal and bilateral Tube secured with: Tape Dental Injury: Teeth and Oropharynx as per pre-operative assessment

## 2016-03-10 NOTE — Progress Notes (Signed)
Orthopedic Tech Progress Note Patient Details:  [name] [name] 26-Jan-1936 [id number] Ortho visit put on cpm at 1810 Patient ID: [name] [name], female   DOB: 1936-02-26, 80 y.o.   MRN: [id number]   Braulio Bosch 03/10/2016, 6:13 PM

## 2016-03-10 NOTE — Anesthesia Postprocedure Evaluation (Signed)
Anesthesia Post Note  Patient: [name] [name]  Procedure(s) Performed: Procedure(s) (LRB): LEFT TOTAL KNEE ARTHROPLASTY (Left)  Patient location during evaluation: PACU Anesthesia Type: General Level of consciousness: awake and alert Pain management: pain level controlled Vital Signs Assessment: post-procedure vital signs reviewed and stable Respiratory status: spontaneous breathing, nonlabored ventilation, respiratory function stable and patient connected to nasal cannula oxygen Cardiovascular status: blood pressure returned to baseline and stable Postop Assessment: no signs of nausea or vomiting Anesthetic complications: no    Last Vitals:  Filed Vitals:   03/10/16 1345 03/10/16 1400  BP: 158/67 168/82  Pulse: 69 75  Temp:    Resp: 16 20    Last Pain:  Filed Vitals:   03/10/16 1413  PainSc: 6                  Olson Lucarelli JENNETTE

## 2016-03-10 NOTE — H&P (View-Only) (Signed)
TOTAL KNEE ADMISSION H&P  Patient is being admitted for left total knee arthroplasty.  Subjective:  Chief Complaint:left knee pain.  HPI: [name] [name], 80 y.o. female, has a history of pain and functional disability in the left knee due to arthritis and has failed non-surgical conservative treatments for greater than 12 weeks to includeNSAID's and/or analgesics and corticosteriod injections.  Onset of symptoms was gradual, starting 1 years ago with gradually worsening course since that time. The patient noted no past surgery on the right knee(s).  Patient currently rates pain in the left knee(s) at 6 out of 10 with activity. Patient has night pain, worsening of pain with activity and weight bearing and pain that interferes with activities of daily living.  Patient has evidence of subchondral sclerosis and joint space narrowing by imaging studies. There is no active infection.  Patient Active Problem List   Diagnosis Date Noted  . 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 Ileus ([place]) 09/05/2014  . Leukocytosis, unspecified 09/05/2014  . Nausea and vomiting 09/04/2014  . Pulmonary edema 09/04/2014  . Hypokalemia 08/28/2014  . Enteritis due to Clostridium difficile 08/27/2014  . 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 Protein-calorie malnutrition, severe ([place]) 08/24/2014  . Obstructive jaundice 08/23/2014  . Acute cholangitis 08/23/2014  . ARF (acute renal failure) ([place]) 08/23/2014  . Coagulopathy ([place]) 08/23/2014  . Back pain   . Arthritis   . Hypertension   . GERD (gastroesophageal reflux disease)   . Anxiety   . IBS (irritable bowel syndrome)   . Hernia of Right posterior flank 04/18/2012   Past Medical History  Diagnosis Date  . Hypertension   . Hyperlipidemia   . Thyroid disease   . GERD (gastroesophageal reflux disease)   . Anxiety   . IBS (irritable bowel syndrome)   . Back pain   . Blood transfusion     at age of 4  . Hypothyroidism   . H/O hiatal hernia   .  Anemia     hx of years ago   . Arthritis     psoriatic arthritis  . ARF (acute renal failure) 08/23/2014  . Pancreatitis     Past Surgical History  Procedure Laterality Date  . Ventral hernia repair  06/08/2012    Procedure: LAPAROSCOPIC VENTRAL HERNIA;  Surgeon: Adin Hector, MD;  Location: [facility];  Service: General;  Laterality: Right;  . Back surgery  2010    thoractic-screws and rods  . Joint replacement  2005    left shoulder replacement   . Cholecystectomy  1988    lap  . Abdominal hysterectomy      partial  . Colonoscopy    . Ercp    . Orif clavicular fracture Left 07/18/2014    Procedure: OPEN REDUCTION INTERNAL FIXATION (ORIF) LEFT CLAVICULAR FRACTURE;  Surgeon: Ninetta Lights, MD;  Location: [facility];  Service: Orthopedics;  Laterality: Left;  . Ercp N/A 08/26/2014    Procedure: ENDOSCOPIC RETROGRADE CHOLANGIOPANCREATOGRAPHY (ERCP);  Surgeon: Jeryl Columbia, MD;  Location: [facility] ENDOSCOPY;  Service: Endoscopy;  Laterality: N/A;  . Tonsillectomy  as child  . Bile duct stent placement  08/26/2014  . Colonoscopy Left 09/08/2014    Procedure: COLONOSCOPY;  Surgeon: Arta Silence, MD;  Location: [facility] ENDOSCOPY;  Service: Endoscopy;  Laterality: Left;  . Eus N/A 09/11/2014    Procedure: ESOPHAGEAL ENDOSCOPIC ULTRASOUND (EUS) RADIAL;  Surgeon: Arta Silence, MD;  Location: [facility] ENDOSCOPY;  Service: Endoscopy;  Laterality: N/A;  .  Ercp N/A 09/11/2014    Procedure: ENDOSCOPIC RETROGRADE CHOLANGIOPANCREATOGRAPHY (ERCP);  Surgeon: Arta Silence, MD;  Location: [facility];  Service: Endoscopy;  Laterality: N/A;  . Spyglass cholangioscopy N/A 09/11/2014    Procedure: [id number] CHOLANGIOSCOPY;  Surgeon: Arta Silence, MD;  Location: [facility] ENDOSCOPY;  Service: Endoscopy;  Laterality: N/A;     (Not in a hospital admission) Allergies  Allergen Reactions  . Tetanus Toxoids Swelling  . Yellow Dyes (Non-Tartrazine) Itching and Other (See Comments)    Makes patient nervous      Social History  Substance Use Topics  . Smoking status: Never Smoker   . Smokeless tobacco: Never Used  . Alcohol Use: No     Comment: rare    Family History  Problem Relation Age of Onset  . Heart disease Mother   . Cancer Father     lung     Review of Systems  Constitutional: Negative.   HENT: Negative.   Eyes: Negative.   Respiratory: Negative.   Cardiovascular: Negative.   Gastrointestinal: Negative.   Genitourinary: Negative.   Musculoskeletal: Positive for joint pain.  Skin: Negative.   Neurological: Negative.   Endo/Heme/Allergies: Negative.   Psychiatric/Behavioral: Negative.     Objective:  Physical Exam  Constitutional: She is oriented to person, place, and time. She appears well-developed and well-nourished.  HENT:  Head: Normocephalic and atraumatic.  Eyes: EOM are normal. Pupils are equal, round, and reactive to light.  Neck: Normal range of motion. Neck supple.  Cardiovascular: Normal rate and regular rhythm.   Respiratory: Effort normal and breath sounds normal.  GI: Soft. Bowel sounds are normal.  Musculoskeletal:  Examination of her left knee reveals a trace effusion.  Range of motion 0-125 degrees.  Medial and lateral joint line tenderness.  Negative log roll.  Negative straight leg raise.  She is neurovascularly intact distally.    Neurological: She is alert and oriented to person, place, and time.  Skin: Skin is warm and dry.  Psychiatric: She has a normal mood and affect. Her behavior is normal. Judgment and thought content normal.    Vital signs in last 24 hours: @VSRANGES @  Labs:   Estimated body mass index is 24.88 kg/(m^2) as calculated from the following:   Height as of 09/04/14: 5\' 1"  (1.549 m).   Weight as of 09/04/14: 59.7 kg (131 lb 9.8 oz).   Imaging Review Plain radiographs demonstrate severe degenerative joint disease of the left knee(s). The overall alignment isneutral. The bone quality appears to be fair for age and  reported activity level.  Assessment/Plan:  End stage arthritis, left knee   The patient history, physical examination, clinical judgment of the provider and imaging studies are consistent with end stage degenerative joint disease of the left knee(s) and total knee arthroplasty is deemed medically necessary. The treatment options including medical management, injection therapy arthroscopy and arthroplasty were discussed at length. The risks and benefits of total knee arthroplasty were presented and reviewed. The risks due to aseptic loosening, infection, stiffness, patella tracking problems, thromboembolic complications and other imponderables were discussed. The patient acknowledged the explanation, agreed to proceed with the plan and consent was signed. Patient is being admitted for inpatient treatment for surgery, pain control, PT, OT, prophylactic antibiotics, VTE prophylaxis, progressive ambulation and ADL's and discharge planning. The patient is planning to be discharged home with home health services

## 2016-03-10 NOTE — Interval H&P Note (Signed)
History and Physical Interval Note:  03/10/2016 8:31 AM  [name] [name]  has presented today for surgery, with the diagnosis of djd left knee  The various methods of treatment have been discussed with the patient and family. After consideration of risks, benefits and other options for treatment, the patient has consented to  Procedure(s): LEFT TOTAL KNEE ARTHROPLASTY (Left) as a surgical intervention .  The patient's history has been reviewed, patient examined, no change in status, stable for surgery.  I have reviewed the patient's chart and labs.  Questions were answered to the patient's satisfaction.     Ninetta Lights

## 2016-03-10 NOTE — Discharge Summary (Addendum)
Patient ID: [name] [name] MRN: [id number] DOB/AGE: 78-09-1936 78 y.o.  Admit date: 03/10/2016 Discharge date: 03/11/2016  Admission Diagnoses:  Active Problems:   S/P total knee replacement   Discharge Diagnoses:  Same  Past Medical History  Diagnosis Date  . Hypertension   . Hyperlipidemia   . Thyroid disease   . GERD (gastroesophageal reflux disease)   . Anxiety   . IBS (irritable bowel syndrome)   . Back pain   . Blood transfusion     at age of 4  . Hypothyroidism   . H/O hiatal hernia   . Anemia     hx of years ago   . Arthritis     psoriatic arthritis  . Pancreatitis   . PONV (postoperative nausea and vomiting)   . Depression   . ARF (acute renal failure) ([place]) 08/23/2014    Surgeries: Procedure(s): LEFT TOTAL KNEE ARTHROPLASTY on 03/10/2016   Consultants:    Discharged Condition: Improved  Hospital Course: [name] [name] is an 80 y.o. female who was admitted 03/10/2016 for operative treatment of primary localized osteoarthritis left knee. Patient has severe unremitting pain that affects sleep, daily activities, and work/hobbies. After pre-op clearance the patient was taken to the operating room on 03/10/2016 and underwent  Procedure(s): LEFT TOTAL KNEE ARTHROPLASTY.  Patient with a pre-op Hb of 14.4 developed ABLA on pod #1 with a Hb of 10.7.  She is currently stable but we will continue to follow.  Patient was given perioperative antibiotics:      Anti-infectives    Start     Dose/Rate Route Frequency Ordered Stop   03/10/16 2200  ceFAZolin (ANCEF) IVPB 2g/100 mL premix     2 g 200 mL/hr over 30 Minutes Intravenous Every 12 hours 03/10/16 1054 03/10/16 2143   03/10/16 0945  ceFAZolin (ANCEF) IVPB 2g/100 mL premix     2 g 200 mL/hr over 30 Minutes Intravenous To ShortStay Surgical 03/09/16 1101 03/10/16 0942       Patient was given sequential compression devices, early ambulation, and chemoprophylaxis to prevent DVT.  Patient benefited maximally from  hospital stay and there were no complications.    Recent vital signs:  Patient Vitals for the past 24 hrs:  BP Temp Temp src Pulse Resp SpO2 Height Weight  03/11/16 0300 (!) 179/69 mmHg - Oral 90 18 94 % - -  03/10/16 2024 (!) 199/74 mmHg 98.1 F (36.7 C) Oral (!) 103 17 100 % - -  03/10/16 1851 - - - - - 95 % - -  03/10/16 1800 - - - - - 98 % - -  03/10/16 1623 - - - - - 100 % - -  03/10/16 1444 (!) 155/62 mmHg 98.2 F (36.8 C) Oral 66 18 93 % - -  03/10/16 1415 - - - 66 13 95 % - -  03/10/16 1400 (!) 168/82 mmHg 98.7 F (37.1 C) - 75 20 97 % - -  03/10/16 1345 (!) 158/67 mmHg - - 69 16 99 % - -  03/10/16 1330 (!) 159/62 mmHg - - 62 11 100 % - -  03/10/16 1315 (!) 167/73 mmHg - - 63 20 100 % - -  03/10/16 1300 (!) 158/67 mmHg - - 63 14 100 % - -  03/10/16 1245 - - - 63 17 100 % - -  03/10/16 1230 (!) 156/64 mmHg - - 62 12 98 % - -  03/10/16 1215 (!) 158/78 mmHg - - 65 (!) 21  97 % - -  03/10/16 1200 (!) 179/78 mmHg - - 65 18 95 % - -  03/10/16 1145 (!) 179/78 mmHg - - 74 18 98 % - -  03/10/16 1136 (!) 188/88 mmHg 97.4 F (36.3 C) - 76 16 98 % - -  03/10/16 0934 (!) 188/64 mmHg - - (!) 54 16 98 % - -  03/10/16 0818 - 98.4 F (36.9 C) - - - - - -  03/10/16 0817 (!) 171/70 mmHg - - (!) 58 20 97 % 5\' 1"  (1.549 m) 64.411 kg (142 lb)     Recent laboratory studies:   Recent Labs  03/11/16 0333  WBC 10.0  HGB 10.7*  HCT 33.7*  PLT 211  NA 142  K 4.3  CL 108  CO2 24  BUN 24*  CREATININE 1.17*  GLUCOSE 131*  CALCIUM 8.7*     Discharge Medications:     Medication List    STOP taking these medications        acetaminophen 500 MG tablet  Commonly known as:  TYLENOL     aspirin-acetaminophen-caffeine 250-250-65 MG tablet  Commonly known as:  EXCEDRIN MIGRAINE     HYDROcodone-acetaminophen 5-325 MG tablet  Commonly known as:  NORCO/VICODIN      TAKE these medications        ALPRAZolam 1 MG tablet  Commonly known as:  XANAX  Take 1 mg by mouth 4 (four) times  daily as needed for anxiety.     apixaban 2.5 MG Tabs tablet  Commonly known as:  ELIQUIS  Take 1 tab po q12 hours x 14 days following surgery to prevent blood clots     atenolol 50 MG tablet  Commonly known as:  TENORMIN  Take 50 mg by mouth every morning.     fenofibrate 145 MG tablet  Commonly known as:  TRICOR  Take 145 mg by mouth daily with breakfast.     furosemide 20 MG tablet  Commonly known as:  LASIX  Take 20 mg by mouth daily with breakfast.     hyoscyamine 0.375 MG 12 hr tablet  Commonly known as:  LEVBID  Take 0.375 mg by mouth every 12 (twelve) hours as needed for cramping.     levothyroxine 100 MCG tablet  Commonly known as:  SYNTHROID, LEVOTHROID  Take 100 mcg by mouth daily before breakfast.     multivitamin with minerals Tabs tablet  Take 1 tablet by mouth daily.     ondansetron 4 MG tablet  Commonly known as:  ZOFRAN  Take 1 tablet (4 mg total) by mouth every 8 (eight) hours as needed for nausea or vomiting.     oxyCODONE-acetaminophen 5-325 MG tablet  Commonly known as:  ROXICET  Take 1-2 tablets by mouth every 4 (four) hours as needed.     potassium chloride SA 20 MEQ tablet  Commonly known as:  K-DUR,KLOR-CON  Take 20 mEq by mouth daily.     venlafaxine XR 75 MG 24 hr capsule  Commonly known as:  EFFEXOR-XR  Take 75 mg by mouth 2 (two) times daily.     zolpidem 10 MG tablet  Commonly known as:  AMBIEN  Take 10 mg by mouth at bedtime.        Diagnostic Studies: Dg Knee Left Port  03/10/2016  CLINICAL DATA:  Status post left knee replacement EXAM: PORTABLE LEFT KNEE - 1-2 VIEW COMPARISON:  None. FINDINGS: A left knee prosthesis seen. Air is noted in the surgical  bed. No acute bony abnormality is noted. IMPRESSION: Status post left knee replacement Electronically Signed   By: Inez Catalina M.D.   On: 03/10/2016 12:31    Disposition: 01-Home or Self Care    Follow-up Information    Follow up with Ninetta Lights, MD. Schedule an  appointment as soon as possible for a visit in 2 weeks.   Specialty:  Orthopedic Surgery   Contact information:   [address] [place] [address] [phone]        Signed: [name] 03/11/2016, 6:43 AM

## 2016-03-11 ENCOUNTER — Encounter (HOSPITAL_COMMUNITY): Payer: Self-pay | Admitting: Orthopedic Surgery

## 2016-03-11 DIAGNOSIS — M1712 Unilateral primary osteoarthritis, left knee: Secondary | ICD-10-CM | POA: Diagnosis not present

## 2016-03-11 LAB — CBC
HCT: 33.7 % — ABNORMAL LOW (ref 36.0–46.0)
Hemoglobin: 10.7 g/dL — ABNORMAL LOW (ref 12.0–15.0)
MCH: 28.7 pg (ref 26.0–34.0)
MCHC: 31.8 g/dL (ref 30.0–36.0)
MCV: 90.3 fL (ref 78.0–100.0)
Platelets: 211 10*3/uL (ref 150–400)
RBC: 3.73 MIL/uL — ABNORMAL LOW (ref 3.87–5.11)
RDW: 12.6 % (ref 11.5–15.5)
WBC: 10 10*3/uL (ref 4.0–10.5)

## 2016-03-11 LAB — BASIC METABOLIC PANEL
Anion gap: 10 (ref 5–15)
BUN: 24 mg/dL — AB (ref 6–20)
CO2: 24 mmol/L (ref 22–32)
CREATININE: 1.17 mg/dL — AB (ref 0.44–1.00)
Calcium: 8.7 mg/dL — ABNORMAL LOW (ref 8.9–10.3)
Chloride: 108 mmol/L (ref 101–111)
GFR calc non Af Amer: 44 mL/min — ABNORMAL LOW (ref >60)
GFR, EST AFRICAN AMERICAN: 51 mL/min — AB (ref >60)
Glucose, Bld: 131 mg/dL — ABNORMAL HIGH (ref 65–99)
Potassium: 4.3 mmol/L (ref 3.5–5.1)
SODIUM: 142 mmol/L (ref 135–145)

## 2016-03-11 MED ORDER — OXYCODONE HCL 5 MG PO TABS
5.0000 mg | ORAL_TABLET | ORAL | Status: DC | PRN
  Administered 2016-03-11 (×2): 15 mg via ORAL
  Filled 2016-03-11 (×2): qty 3

## 2016-03-11 NOTE — Progress Notes (Signed)
Orthopedic Tech Progress Note Patient Details:  [name] [name] 02-23-1936 [id number]  Patient ID: [name], female   DOB: 02/08/1936, 80 y.o.   MRN: [id number] Applied cpm 0-35  [name] 03/11/2016, 6:19 AM

## 2016-03-11 NOTE — Progress Notes (Signed)
Occupational Therapy Evaluation Patient Details Name: [name] [name] MRN: [id number] DOB: 1935-12-18 Today's Date: 03/11/2016    History of Present Illness Pt admitted for L TKA. PMHx: back pain, OA, anxiety, HTN   Clinical Impression   PTA, pt was independent with ADLs and mobility. Pt currently requires min assist for LB ADLs which her husband can provide and min guard assist for transfers and ambulation. Pt also appeared disoriented this session and claimed x3 that it was 9 o'clock at night and had some short-term memory deficits - suspect this is due to medications. Pt plans to d/c home with 24/7 assistance from her husband. Pt will benefit from continued acute OT to increase independence and safety with ADLs and mobility to allow for safe discharge home. Recommend HHOT upon discharge.    Follow Up Recommendations  Home health OT;Supervision/Assistance - 24 hour    Equipment Recommendations  None recommended by OT    Recommendations for Other Services       Precautions / Restrictions Precautions Precautions: Knee;Fall Restrictions Weight Bearing Restrictions: Yes LLE Weight Bearing: Weight bearing as tolerated      Mobility Bed Mobility Overal bed mobility: Needs Assistance Bed Mobility: Supine to Sit;Sit to Supine     Supine to sit: Supervision Sit to supine: Supervision   General bed mobility comments: HOB flat, no use of bedrails, exited on R side to simulate home environment. Educated pt to use R foot under L ankle to assist with moving LLE on/off bed. No physical assist required.  Transfers Overall transfer level: Needs assistance Equipment used: Rolling walker (2 wheeled) Transfers: Sit to/from Stand Sit to Stand: Min guard         General transfer comment: Min guard for safety and balance as pt with increased shaking this session. Max verbal cues for safe hand placement on seated surfaces and proper positioning of RW while ambulating.     Balance  Overall balance assessment: Needs assistance Sitting-balance support: No upper extremity supported;Feet supported Sitting balance-Leahy Scale: Good     Standing balance support: Bilateral upper extremity supported;During functional activity Standing balance-Leahy Scale: Poor                              ADL Overall ADL's : Needs assistance/impaired     Grooming: Wash/dry hands;Min guard;Standing   Upper Body Bathing: Set up;Sitting   Lower Body Bathing: Minimal assistance;Sit to/from stand Lower Body Bathing Details (indicate cue type and reason): unable to reach feet Upper Body Dressing : Set up;Sitting   Lower Body Dressing: Minimal assistance;Cueing for compensatory techniques;Sit to/from stand Lower Body Dressing Details (indicate cue type and reason): Cues to dress LLE first and undress it last, husband can assist Toilet Transfer: Min guard;Cueing for safety;Ambulation;BSC;RW Toilet Transfer Details (indicate cue type and reason): BSC over toilet, cues to feel BSC on back of legs and to reach back before sitting Toileting- Clothing Manipulation and Hygiene: Min guard;Sit to/from stand       Functional mobility during ADLs: Min guard;Rolling walker General ADL Comments: Reviewed knee precautions, use of CPM/foam to elevate L heel, compensatory strategies for LB ADLs, energy conservation, pain/edema management, and fall prevention strategies. Advised pt to wait until Alexander comes to house to attempt getting into tub to bathe - pt agreed.     Vision Vision Assessment?: No apparent visual deficits   Perception     Praxis      Pertinent Vitals/Pain Pain Assessment:  0-10 Pain Score: 7  Pain Location: L knee Pain Descriptors / Indicators: Aching Pain Intervention(s): Monitored during session;Limited activity within patient's tolerance;Repositioned     Hand Dominance Right   Extremity/Trunk Assessment Upper Extremity Assessment Upper Extremity Assessment:  Generalized weakness (overall shaking - pt has bilateral hand tremors from medicat)   Lower Extremity Assessment Lower Extremity Assessment: Generalized weakness;LLE deficits/detail LLE Deficits / Details: decreased strength and ROM post op   Cervical / Trunk Assessment Cervical / Trunk Assessment: Normal   Communication Communication Communication: HOH   Cognition Arousal/Alertness: Awake/alert Behavior During Therapy: WFL for tasks assessed/performed Overall Cognitive Status: Impaired/Different from baseline Area of Impairment: Orientation;Memory Orientation Level: Disoriented to;Time   Memory: Decreased short-term memory             General Comments       Exercises       Shoulder Instructions      Home Living Family/patient expects to be discharged to:: Private residence Living Arrangements: Spouse/significant other Available Help at Discharge: Available 24 hours/day;Family Type of Home: House Home Access: Stairs to enter Technical brewer of Steps: 1   Home Layout: Two level;Able to live on main level with bedroom/bathroom Alternate Level Stairs-Number of Steps: 10 Alternate Level Stairs-Rails: Left Bathroom Shower/Tub: Tub/shower unit;Curtain Shower/tub characteristics: Architectural technologist: Standard     Home Equipment: Environmental consultant - 2 wheels;Cane - single point;Bedside commode;Shower seat          Prior Functioning/Environment Level of Independence: Independent             OT Diagnosis: Generalized weakness;Acute pain;Cognitive deficits   OT Problem List: Decreased strength;Decreased range of motion;Decreased activity tolerance;Impaired balance (sitting and/or standing);Decreased coordination;Decreased cognition;Decreased safety awareness;Decreased knowledge of use of DME or AE;Decreased knowledge of precautions;Pain   OT Treatment/Interventions: Self-care/ADL training;Therapeutic exercise;Energy conservation;DME and/or AE  instruction;Therapeutic activities;Patient/family education;Balance training    OT Goals(Current goals can be found in the care plan section) Acute Rehab OT Goals Patient Stated Goal: to go home today and get back to working out OT Goal Formulation: With patient Time For Goal Achievement: 03/25/16 Potential to Achieve Goals: Good ADL Goals Pt Will Perform Lower Body Bathing: with modified independence;sit to/from stand Pt Will Perform Lower Body Dressing: with modified independence;sit to/from stand Pt Will Transfer to Toilet: with modified independence;ambulating;bedside commode (over toilet) Pt Will Perform Toileting - Clothing Manipulation and hygiene: with modified independence;sit to/from stand Pt Will Perform Tub/Shower Transfer: Tub transfer;with supervision;ambulating;shower seat;rolling walker  OT Frequency: Min 2X/week   Barriers to D/C:            Co-evaluation              End of Session Equipment Utilized During Treatment: Gait belt;Rolling walker CPM Left Knee CPM Left Knee: Off Nurse Communication: Mobility status  Activity Tolerance: Patient tolerated treatment well Patient left: in bed;with call bell/phone within reach;Other (comment) (LLe elevated)   Time: [id number] OT Time Calculation (min): 31 min Charges:  OT General Charges $OT Visit: 1 Procedure OT Evaluation $OT Eval Moderate Complexity: 1 Procedure OT Treatments $Self Care/Home Management : 8-22 mins G-Codes:    Redmond Baseman, OTR/L Pager: [phone] 03/11/2016, 10:22 AM

## 2016-03-11 NOTE — Progress Notes (Signed)
Physical Therapy Treatment Patient Details Name: [name] [name] MRN: [id number] DOB: 1936/11/17 Today's Date: 03/11/2016    History of Present Illness Pt admitted for L TKA. PMHx: back pain, OA, anxiety, HTN    PT Comments    Pt very motivated to continue with therapy to progress towards goals. Pt still has deficits in ROM and strength. Pt shows vast improvements from AM visit with ambulation, strength and ROM, but still requires cueing during transfers. Will continue to recommend HHPT to increase independence and quality of life.  Follow Up Recommendations  Home health PT     Equipment Recommendations       Recommendations for Other Services       Precautions / Restrictions Precautions Precautions: Knee;Fall Restrictions Weight Bearing Restrictions: Yes LLE Weight Bearing: Weight bearing as tolerated    Mobility  Bed Mobility Overal bed mobility: Needs Assistance Bed Mobility: Supine to Sit     Supine to sit: Supervision Sit to supine: Supervision   General bed mobility comments: supervision for safety. pt reported that she learned to use right LE to help move left LE, but pt did not need assistance neither from therapist nor from other foot.  Transfers Overall transfer level: Needs assistance Equipment used: Rolling walker (2 wheeled) Transfers: Sit to/from Stand Sit to Stand: Min guard         General transfer comment: sit to stand from toilet and from bed. min guard for safety and balance. cues for hand placement and foot placement.  Ambulation/Gait Ambulation/Gait assistance: Min guard Ambulation Distance (Feet): 500 Feet Assistive device: Rolling walker (2 wheeled) Gait Pattern/deviations: Step-through pattern;Trunk flexed   Gait velocity interpretation: Below normal speed for age/gender General Gait Details: cues for posture, step length, and increasing dorsiflexion. pt still shaky throughout ambulation, but improved from this morning   Stairs             Wheelchair Mobility    Modified Rankin (Stroke Patients Only)       Balance Overall balance assessment: Needs assistance Sitting-balance support: No upper extremity supported;Feet unsupported Sitting balance-Leahy Scale: Good     Standing balance support: Bilateral upper extremity supported Standing balance-Leahy Scale: Fair                      Cognition Arousal/Alertness: Awake/alert Behavior During Therapy: WFL for tasks assessed/performed Overall Cognitive Status: Impaired/Different from baseline Area of Impairment: Orientation;Memory Orientation Level: Disoriented to;Time   Memory: Decreased short-term memory              Exercises Total Joint Exercises Heel Slides: AAROM;Left;15 reps;Seated Straight Leg Raises: AROM;Left;15 reps;Seated Long Arc Quad: AROM;Left;15 reps;Seated Goniometric ROM: 8-100    General Comments        Pertinent Vitals/Pain Pain Assessment: 0-10 Pain Score: 7  Pain Location: left knee Pain Descriptors / Indicators: Aching Pain Intervention(s): Monitored during session;Premedicated before session;Repositioned    Home Living Family/patient expects to be discharged to:: Private residence Living Arrangements: Spouse/significant other Available Help at Discharge: Available 24 hours/day;Family Type of Home: House Home Access: Stairs to enter   Home Layout: Two level;Able to live on main level with bedroom/bathroom Home Equipment: Gilford Rile - 2 wheels;Cane - single point;Bedside commode;Shower seat      Prior Function Level of Independence: Independent          PT Goals (current goals can now be found in the care plan section) Acute Rehab PT Goals Patient Stated Goal: to go home today and get back  to working out Potential to Achieve Goals: Good Progress towards PT goals: Progressing toward goals    Frequency       PT Plan Current plan remains appropriate    Co-evaluation             End of  Session Equipment Utilized During Treatment: Gait belt Activity Tolerance: Patient tolerated treatment well Patient left: in chair;with call bell/phone within reach;with family/visitor present     Time: [id number] PT Time Calculation (min) (ACUTE ONLY): 37 min  Charges:  $Gait Training: 8-22 mins $Therapeutic Exercise: 8-22 mins                    G CodesHaze Justin 03-21-16, 12:36 PM   Haze Justin, SPT [phone]

## 2016-03-11 NOTE — Progress Notes (Signed)
Physical Therapy Evaluation Patient Details Name: [name] [name] MRN: [id number] DOB: 03/04/1936 Today's Date: 03/11/2016   History of Present Illness  Pt admitted for L TKA. PMHx: back pain, OA, anxiety, HTN  Clinical Impression  Pt somewhat distracted throughout session with cues for attention and precautions. Pt educated for knee without roll under, HEP, plan, progression and stairs this session. Pt with decreased strength, ROM, transfers and function who will benefit from acute therapy to maximize mobility, function, gait and independence. Pt in bone foam end of session.    Follow Up Recommendations Home health PT    Equipment Recommendations  None recommended by PT    Recommendations for Other Services OT consult     Precautions / Restrictions Precautions Precautions: Knee;Fall Restrictions Weight Bearing Restrictions: Yes LLE Weight Bearing: Weight bearing as tolerated      Mobility  Bed Mobility Overal bed mobility: Needs Assistance Bed Mobility: Supine to Sit     Supine to sit: Supervision     General bed mobility comments: increased time, pt with initial posterior lean in sitting needing min assist for balance  Transfers Overall transfer level: Needs assistance   Transfers: Sit to/from Stand Sit to Stand: Min guard         General transfer comment: cues for hand and LLE positioning from bed, BSc and to recliner  Ambulation/Gait Ambulation/Gait assistance: Min guard Ambulation Distance (Feet): 250 Feet Assistive device: Rolling walker (2 wheeled) Gait Pattern/deviations: Step-through pattern;Decreased stride length;Trunk flexed   Gait velocity interpretation: Below normal speed for age/gender General Gait Details: cues for position in RW, posture, sequence and increased dorsiflexion left. pt generally shaky throughout mobility which pt reports as worse than normal  Stairs Stairs: Yes Stairs assistance: Supervision Stair Management: With  walker;Backwards Number of Stairs: 1 General stair comments: cues for sequence  Wheelchair Mobility    Modified Rankin (Stroke Patients Only)       Balance Overall balance assessment: Needs assistance   Sitting balance-Leahy Scale: Fair       Standing balance-Leahy Scale: Poor                               Pertinent Vitals/Pain Pain Assessment: 0-10 Pain Score: 8  Pain Location: left knee Pain Descriptors / Indicators: Aching Pain Intervention(s): Limited activity within patient's tolerance;Monitored during session;Repositioned;RN gave pain meds during session    [place] expects to be discharged to:: Private residence Living Arrangements: Spouse/significant other Available Help at Discharge: Available 24 hours/day Type of Home: House Home Access: Stairs to enter   [employer] of Steps: 1 Home Layout: Two level;Able to live on main level with bedroom/bathroom Home Equipment: Gilford Rile - 2 wheels;Cane - single point;Bedside commode      Prior Function Level of Independence: Independent               Hand Dominance   Dominant Hand: Right    Extremity/Trunk Assessment   Upper Extremity Assessment: Overall WFL for tasks assessed           Lower Extremity Assessment: Generalized weakness;LLE deficits/detail   LLE Deficits / Details: decreased strength and ROM post op     Communication   Communication: HOH  Cognition Arousal/Alertness: Awake/alert Behavior During Therapy: WFL for tasks assessed/performed Overall Cognitive Status: Within Functional Limits for tasks assessed  General Comments      Exercises Total Joint Exercises Heel Slides: AAROM;Left;5 reps;Supine Straight Leg Raises: AROM;Left;5 reps;Supine Long Arc Quad: Left;5 reps;Seated;AAROM      Assessment/Plan    PT Assessment Patient needs continued PT services  PT Diagnosis Difficulty walking;Acute  pain;Generalized weakness   PT Problem List Decreased strength;Decreased range of motion;Decreased activity tolerance;Decreased balance;Pain;Decreased mobility;Decreased knowledge of use of DME  PT Treatment Interventions DME instruction;Gait training;Stair training;Functional mobility training;Therapeutic activities;Therapeutic exercise;Patient/family education   PT Goals (Current goals can be found in the Care Plan section) Acute Rehab PT Goals Patient Stated Goal: return home adn to working out PT Goal Formulation: With patient Time For Goal Achievement: 03/18/16 Potential to Achieve Goals: Good    Frequency 7X/week   Barriers to discharge        Co-evaluation               End of Session Equipment Utilized During Treatment: Gait belt Activity Tolerance: Patient tolerated treatment well Patient left: in chair;with call bell/phone within reach Nurse Communication: Mobility status         Time: 0724-0801 PT Time Calculation (min) (ACUTE ONLY): 37 min   Charges:   PT Evaluation $PT Eval Moderate Complexity: 1 Procedure PT Treatments $Gait Training: 8-22 mins   PT G CodesMelford Aase 03/11/2016, 8:03 AM Elwyn Reach, [place]

## 2016-03-11 NOTE — Progress Notes (Signed)
Subjective: 1 Day Post-Op Procedure(s) (LRB): LEFT TOTAL [name] ARTHROPLASTY (Left) Patient reports pain as moderate.  No nausea/vomiting, lightheadedness/dizziness, chest pain/sob.  Positive flatus but no bm.  Tolerating diet.  Objective: Vital signs in last 24 hours: Temp:  [97.4 F (36.3 C)-98.7 F (37.1 C)] 98.1 F (36.7 C) (03/29 2024) Pulse Rate:  [54-103] 90 (03/30 0300) Resp:  [11-21] 18 (03/30 0300) BP: (155-199)/(62-88) 179/69 mmHg (03/30 0300) SpO2:  [93 %-100 %] 94 % (03/30 0300) Weight:  [64.411 kg (142 lb)] 64.411 kg (142 lb) (03/29 0817)  Intake/Output from previous day: 03/29 0701 - 03/30 0700 In: 1215 [I.V.:1215] Out: 600 [Urine:550; Blood:50] Intake/Output this shift:     Recent Labs  03/11/16 0333  HGB 10.7*    Recent Labs  03/11/16 0333  WBC 10.0  RBC 3.73*  HCT 33.7*  PLT 211    Recent Labs  03/11/16 0333  NA 142  K 4.3  CL 108  CO2 24  BUN 24*  CREATININE 1.17*  GLUCOSE 131*  CALCIUM 8.7*   No results for input(s): LABPT, INR in the last 72 hours.  Neurologically intact Neurovascular intact Sensation intact distally Intact pulses distally Dorsiflexion/Plantar flexion intact Compartment soft  Negative homans bilaterally  Assessment/Plan: 1 Day Post-Op Procedure(s) (LRB): LEFT TOTAL [name] ARTHROPLASTY (Left) Advance diet Up with therapy Discharge home with home health likely today as long as she progresses with PT Will increase pain meds I am not going to add muscle relaxer for cramping as she is on xanax ABLA-mild and stable Dry derssing change prn WBAT LLE Please place bone foam   [name] [name] 03/11/2016, 7:02 AM

## 2016-03-11 NOTE — Care Management Note (Signed)
Case Management Note  Patient Details  Name: [name] [name] MRN: [id number] Date of Birth: 02-11-1936  Subjective/Objective:         S/p left total knee arthroplasty           Action/Plan: Set up with [facility] for HHPT by MD office. Spoke with patient and her husband, no change in discharge plan. OT recommending [place], contacted [facility] with [facility] and set up [place]. Patient's husband will be assisting her after discharge. Patient stated that CPM has been delivered to her home by [employer] and she already had rolling walker and 3N1. No other discharge needs identified.  Expected Discharge Date:                  Expected Discharge Plan:  [place]  In-House Referral:  NA  Discharge planning Services  CM Consult  Post Acute Care Choice:  Durable Medical Equipment, Home Health Choice offered to:  Patient  DME Arranged:  CPM DME Agency:  [employer]  HH Arranged:  PT, OT HH Agency:  [facility]  Status of Service:  Completed, signed off  Medicare Important Message Given:    Date Medicare IM Given:    Medicare IM give by:    Date Additional Medicare IM Given:    Additional Medicare Important Message give by:     If discussed at [place] of Stay Meetings, dates discussed:    Additional Comments:  [name], RN 03/11/2016, 12:00 PM

## 2016-03-11 NOTE — Discharge Instructions (Addendum)
INSTRUCTIONS AFTER JOINT REPLACEMENT  ° °o Remove items at home which could result in a fall. This includes throw rugs or furniture in walking pathways °o ICE to the affected joint every three hours while awake for 30 minutes at a time, for at least the first 3-5 days, and then as needed for pain and swelling.  Continue to use ice for pain and swelling. You may notice swelling that will progress down to the foot and ankle.  This is normal after surgery.  Elevate your leg when you are not up walking on it.   °o Continue to use the breathing machine you got in the hospital (incentive spirometer) which will help keep your temperature down.  It is common for your temperature to cycle up and down following surgery, especially at night when you are not up moving around and exerting yourself.  The breathing machine keeps your lungs expanded and your temperature down. ° ° °DIET:  As you were doing prior to hospitalization, we recommend a well-balanced diet. ° °DRESSING / WOUND CARE / SHOWERING ° °Keep the surgical dressing until follow up.  The dressing is water proof, so you can shower without any extra covering.  IF THE DRESSING FALLS OFF or the wound gets wet inside, change the dressing with sterile gauze.  Please use good hand washing techniques before changing the dressing.  Do not use any lotions or creams on the incision until instructed by your surgeon.   ° °ACTIVITY ° °o Increase activity slowly as tolerated, but follow the weight bearing instructions below.   °o No driving for 6 weeks or until further direction given by your physician.  You cannot drive while taking narcotics.  °o No lifting or carrying greater than 10 lbs. until further directed by your surgeon. °o Avoid periods of inactivity such as sitting longer than an hour when not asleep. This helps prevent blood clots.  °o You may return to work once you are authorized by your doctor.  ° ° ° °WEIGHT BEARING  ° °Weight bearing as tolerated with assist  device (walker, cane, etc) as directed, use it as long as suggested by your surgeon or therapist, typically at least 4-6 weeks. ° ° °EXERCISES ° °Results after joint replacement surgery are often greatly improved when you follow the exercise, range of motion and muscle strengthening exercises prescribed by your doctor. Safety measures are also important to protect the joint from further injury. Any time any of these exercises cause you to have increased pain or swelling, decrease what you are doing until you are comfortable again and then slowly increase them. If you have problems or questions, call your caregiver or physical therapist for advice.  ° °Rehabilitation is important following a joint replacement. After just a few days of immobilization, the muscles of the leg can become weakened and shrink (atrophy).  These exercises are designed to build up the tone and strength of the thigh and leg muscles and to improve motion. Often times heat used for twenty to thirty minutes before working out will loosen up your tissues and help with improving the range of motion but do not use heat for the first two weeks following surgery (sometimes heat can increase post-operative swelling).  ° °These exercises can be done on a training (exercise) mat, on the floor, on a table or on a bed. Use whatever works the best and is most comfortable for you.    Use music or television while you are exercising so that   the exercises are a pleasant break in your day. This will make your life better with the exercises acting as a break in your routine that you can look forward to.   Perform all exercises about fifteen times, three times per day or as directed.  You should exercise both the operative leg and the other leg as well. ° °Exercises include: °  °• Quad Sets - Tighten up the muscle on the front of the thigh (Quad) and hold for 5-10 seconds.   °• Straight Leg Raises - With your knee straight (if you were given a brace, keep it on),  lift the leg to 60 degrees, hold for 3 seconds, and slowly lower the leg.  Perform this exercise against resistance later as your leg gets stronger.  °• Leg Slides: Lying on your back, slowly slide your foot toward your buttocks, bending your knee up off the floor (only go as far as is comfortable). Then slowly slide your foot back down until your leg is flat on the floor again.  °• Angel Wings: Lying on your back spread your legs to the side as far apart as you can without causing discomfort.  °• Hamstring Strength:  Lying on your back, push your heel against the floor with your leg straight by tightening up the muscles of your buttocks.  Repeat, but this time bend your knee to a comfortable angle, and push your heel against the floor.  You may put a pillow under the heel to make it more comfortable if necessary.  ° °A rehabilitation program following joint replacement surgery can speed recovery and prevent re-injury in the future due to weakened muscles. Contact your doctor or a physical therapist for more information on knee rehabilitation.  ° ° °CONSTIPATION ° °Constipation is defined medically as fewer than three stools per week and severe constipation as less than one stool per week.  Even if you have a regular bowel pattern at home, your normal regimen is likely to be disrupted due to multiple reasons following surgery.  Combination of anesthesia, postoperative narcotics, change in appetite and fluid intake all can affect your bowels.  ° °YOU MUST use at least one of the following options; they are listed in order of increasing strength to get the job done.  They are all available over the counter, and you may need to use some, POSSIBLY even all of these options:   ° °Drink plenty of fluids (prune juice may be helpful) and high fiber foods °Colace 100 mg by mouth twice a day  °Senokot for constipation as directed and as needed Dulcolax (bisacodyl), take with full glass of water  °Miralax (polyethylene glycol)  once or twice a day as needed. ° °If you have tried all these things and are unable to have a bowel movement in the first 3-4 days after surgery call either your surgeon or your primary doctor.   ° °If you experience loose stools or diarrhea, hold the medications until you stool forms back up.  If your symptoms do not get better within 1 week or if they get worse, check with your doctor.  If you experience "the worst abdominal pain ever" or develop nausea or vomiting, please contact the office immediately for further recommendations for treatment. ° ° °ITCHING:  If you experience itching with your medications, try taking only a single pain pill, or even half a pain pill at a time.  You can also use Benadryl over the counter for itching or also to   help with sleep.   TED HOSE STOCKINGS:  Use stockings on both legs until for at least 2 weeks or as directed by physician office. They may be removed at night for sleeping.  MEDICATIONS:  See your medication summary on the After Visit Summary that nursing will review with you.  You may have some home medications which will be placed on hold until you complete the course of blood thinner medication.  It is important for you to complete the blood thinner medication as prescribed.  PRECAUTIONS:  If you experience chest pain or shortness of breath - call 911 immediately for transfer to the hospital emergency department.   If you develop a fever greater that 101 F, purulent drainage from wound, increased redness or drainage from wound, foul odor from the wound/dressing, or calf pain - CONTACT YOUR SURGEON.                                                   FOLLOW-UP APPOINTMENTS:  If you do not already have a post-op appointment, please call the office for an appointment to be seen by your surgeon.  Guidelines for how soon to be seen are listed in your After Visit Summary, but are typically between 1-4 weeks after surgery.  OTHER INSTRUCTIONS:   Knee  Replacement:  Do not place pillow under knee, focus on keeping the knee straight while resting. CPM instructions: 0-90 degrees, 2 hours in the morning, 2 hours in the afternoon, and 2 hours in the evening. Place foam block, curve side up under heel at all times except when in CPM or when walking.  DO NOT modify, tear, cut, or change the foam block in any way.  MAKE SURE YOU:   Understand these instructions.   Get help right away if you are not doing well or get worse.    Thank you for letting us be a part of your medical care team.  It is a privilege we respect greatly.  We hope these instructions will help you stay on track for a fast and full recovery!   Information on my medicine - ELIQUIS (apixaban)  This medication education was reviewed with me or my healthcare representative as part of my discharge preparation.  The pharmacist that spoke with me during my hospital stay was:  Arman Bogus, [facility]  Why was Eliquis prescribed for you? Eliquis was prescribed for you to reduce the risk of blood clots forming after orthopedic surgery.    What do You need to know about Eliquis? Take your Eliquis TWICE DAILY - one tablet in the morning and one tablet in the evening with or without food.  It would be best to take the dose about the same time each day.  If you have difficulty swallowing the tablet whole please discuss with your pharmacist how to take the medication safely.  Take Eliquis exactly as prescribed by your doctor and DO NOT stop taking Eliquis without talking to the doctor who prescribed the medication.  Stopping without other medication to take the place of Eliquis may increase your risk of developing a clot.  After discharge, you should have regular check-up appointments with your healthcare provider that is prescribing your Eliquis.  What do you do if you miss a dose? If a dose of ELIQUIS is not taken at the scheduled time, take  it as soon as possible on the same  day and twice-daily administration should be resumed.  The dose should not be doubled to make up for a missed dose.  Do not take more than one tablet of ELIQUIS at the same time.  Important Safety Information A possible side effect of Eliquis is bleeding. You should call your healthcare provider right away if you experience any of the following: ? Bleeding from an injury or your nose that does not stop. ? Unusual colored urine (red or dark brown) or unusual colored stools (red or black). ? Unusual bruising for unknown reasons. ? A serious fall or if you hit your head (even if there is no bleeding).  Some medicines may interact with Eliquis and might increase your risk of bleeding or clotting while on Eliquis. To help avoid this, consult your healthcare provider or pharmacist prior to using any new prescription or non-prescription medications, including herbals, vitamins, non-steroidal anti-inflammatory drugs (NSAIDs) and supplements.  This website has more information on Eliquis (apixaban): http://www.eliquis.com/eliquis/home

## 2016-03-13 DIAGNOSIS — Z96652 Presence of left artificial knee joint: Secondary | ICD-10-CM | POA: Diagnosis not present

## 2016-03-13 DIAGNOSIS — F329 Major depressive disorder, single episode, unspecified: Secondary | ICD-10-CM | POA: Diagnosis not present

## 2016-03-13 DIAGNOSIS — I10 Essential (primary) hypertension: Secondary | ICD-10-CM | POA: Diagnosis not present

## 2016-03-13 DIAGNOSIS — M1990 Unspecified osteoarthritis, unspecified site: Secondary | ICD-10-CM | POA: Diagnosis not present

## 2016-03-13 DIAGNOSIS — Z471 Aftercare following joint replacement surgery: Secondary | ICD-10-CM | POA: Diagnosis not present

## 2016-03-13 DIAGNOSIS — F419 Anxiety disorder, unspecified: Secondary | ICD-10-CM | POA: Diagnosis not present

## 2016-03-15 DIAGNOSIS — Z96652 Presence of left artificial knee joint: Secondary | ICD-10-CM | POA: Diagnosis not present

## 2016-03-15 DIAGNOSIS — M1990 Unspecified osteoarthritis, unspecified site: Secondary | ICD-10-CM | POA: Diagnosis not present

## 2016-03-15 DIAGNOSIS — Z471 Aftercare following joint replacement surgery: Secondary | ICD-10-CM | POA: Diagnosis not present

## 2016-03-15 DIAGNOSIS — F329 Major depressive disorder, single episode, unspecified: Secondary | ICD-10-CM | POA: Diagnosis not present

## 2016-03-15 DIAGNOSIS — I10 Essential (primary) hypertension: Secondary | ICD-10-CM | POA: Diagnosis not present

## 2016-03-15 DIAGNOSIS — F419 Anxiety disorder, unspecified: Secondary | ICD-10-CM | POA: Diagnosis not present

## 2016-03-17 DIAGNOSIS — Z96652 Presence of left artificial knee joint: Secondary | ICD-10-CM | POA: Diagnosis not present

## 2016-03-17 DIAGNOSIS — I10 Essential (primary) hypertension: Secondary | ICD-10-CM | POA: Diagnosis not present

## 2016-03-17 DIAGNOSIS — F329 Major depressive disorder, single episode, unspecified: Secondary | ICD-10-CM | POA: Diagnosis not present

## 2016-03-17 DIAGNOSIS — M1990 Unspecified osteoarthritis, unspecified site: Secondary | ICD-10-CM | POA: Diagnosis not present

## 2016-03-17 DIAGNOSIS — Z471 Aftercare following joint replacement surgery: Secondary | ICD-10-CM | POA: Diagnosis not present

## 2016-03-17 DIAGNOSIS — F419 Anxiety disorder, unspecified: Secondary | ICD-10-CM | POA: Diagnosis not present

## 2016-03-19 DIAGNOSIS — F329 Major depressive disorder, single episode, unspecified: Secondary | ICD-10-CM | POA: Diagnosis not present

## 2016-03-19 DIAGNOSIS — I10 Essential (primary) hypertension: Secondary | ICD-10-CM | POA: Diagnosis not present

## 2016-03-19 DIAGNOSIS — Z471 Aftercare following joint replacement surgery: Secondary | ICD-10-CM | POA: Diagnosis not present

## 2016-03-19 DIAGNOSIS — M1990 Unspecified osteoarthritis, unspecified site: Secondary | ICD-10-CM | POA: Diagnosis not present

## 2016-03-19 DIAGNOSIS — F419 Anxiety disorder, unspecified: Secondary | ICD-10-CM | POA: Diagnosis not present

## 2016-03-19 DIAGNOSIS — Z96652 Presence of left artificial knee joint: Secondary | ICD-10-CM | POA: Diagnosis not present

## 2016-03-22 DIAGNOSIS — M1990 Unspecified osteoarthritis, unspecified site: Secondary | ICD-10-CM | POA: Diagnosis not present

## 2016-03-22 DIAGNOSIS — I10 Essential (primary) hypertension: Secondary | ICD-10-CM | POA: Diagnosis not present

## 2016-03-22 DIAGNOSIS — F419 Anxiety disorder, unspecified: Secondary | ICD-10-CM | POA: Diagnosis not present

## 2016-03-22 DIAGNOSIS — F329 Major depressive disorder, single episode, unspecified: Secondary | ICD-10-CM | POA: Diagnosis not present

## 2016-03-22 DIAGNOSIS — Z471 Aftercare following joint replacement surgery: Secondary | ICD-10-CM | POA: Diagnosis not present

## 2016-03-22 DIAGNOSIS — Z96652 Presence of left artificial knee joint: Secondary | ICD-10-CM | POA: Diagnosis not present

## 2016-03-23 DIAGNOSIS — Z96652 Presence of left artificial knee joint: Secondary | ICD-10-CM | POA: Diagnosis not present

## 2016-03-29 DIAGNOSIS — M1712 Unilateral primary osteoarthritis, left knee: Secondary | ICD-10-CM | POA: Diagnosis not present

## 2016-03-29 DIAGNOSIS — Z96652 Presence of left artificial knee joint: Secondary | ICD-10-CM | POA: Diagnosis not present

## 2016-03-29 DIAGNOSIS — M25562 Pain in left knee: Secondary | ICD-10-CM | POA: Diagnosis not present

## 2016-03-29 DIAGNOSIS — M25662 Stiffness of left knee, not elsewhere classified: Secondary | ICD-10-CM | POA: Diagnosis not present

## 2016-04-02 DIAGNOSIS — M1712 Unilateral primary osteoarthritis, left knee: Secondary | ICD-10-CM | POA: Diagnosis not present

## 2016-04-02 DIAGNOSIS — Z96652 Presence of left artificial knee joint: Secondary | ICD-10-CM | POA: Diagnosis not present

## 2016-04-02 DIAGNOSIS — M25562 Pain in left knee: Secondary | ICD-10-CM | POA: Diagnosis not present

## 2016-04-02 DIAGNOSIS — M25662 Stiffness of left knee, not elsewhere classified: Secondary | ICD-10-CM | POA: Diagnosis not present

## 2016-04-07 DIAGNOSIS — M25562 Pain in left knee: Secondary | ICD-10-CM | POA: Diagnosis not present

## 2016-04-07 DIAGNOSIS — M1712 Unilateral primary osteoarthritis, left knee: Secondary | ICD-10-CM | POA: Diagnosis not present

## 2016-04-07 DIAGNOSIS — Z96652 Presence of left artificial knee joint: Secondary | ICD-10-CM | POA: Diagnosis not present

## 2016-04-07 DIAGNOSIS — M25662 Stiffness of left knee, not elsewhere classified: Secondary | ICD-10-CM | POA: Diagnosis not present

## 2016-04-14 DIAGNOSIS — M25562 Pain in left knee: Secondary | ICD-10-CM | POA: Diagnosis not present

## 2016-04-14 DIAGNOSIS — Z96652 Presence of left artificial knee joint: Secondary | ICD-10-CM | POA: Diagnosis not present

## 2016-04-14 DIAGNOSIS — M25662 Stiffness of left knee, not elsewhere classified: Secondary | ICD-10-CM | POA: Diagnosis not present

## 2016-04-14 DIAGNOSIS — M1712 Unilateral primary osteoarthritis, left knee: Secondary | ICD-10-CM | POA: Diagnosis not present

## 2016-04-16 DIAGNOSIS — M25562 Pain in left knee: Secondary | ICD-10-CM | POA: Diagnosis not present

## 2016-04-16 DIAGNOSIS — Z96652 Presence of left artificial knee joint: Secondary | ICD-10-CM | POA: Diagnosis not present

## 2016-04-16 DIAGNOSIS — M25662 Stiffness of left knee, not elsewhere classified: Secondary | ICD-10-CM | POA: Diagnosis not present

## 2016-04-16 DIAGNOSIS — M1712 Unilateral primary osteoarthritis, left knee: Secondary | ICD-10-CM | POA: Diagnosis not present

## 2016-04-20 DIAGNOSIS — Z96652 Presence of left artificial knee joint: Secondary | ICD-10-CM | POA: Diagnosis not present

## 2016-04-21 DIAGNOSIS — M25662 Stiffness of left knee, not elsewhere classified: Secondary | ICD-10-CM | POA: Diagnosis not present

## 2016-04-21 DIAGNOSIS — M25562 Pain in left knee: Secondary | ICD-10-CM | POA: Diagnosis not present

## 2016-04-21 DIAGNOSIS — Z96652 Presence of left artificial knee joint: Secondary | ICD-10-CM | POA: Diagnosis not present

## 2016-04-21 DIAGNOSIS — M1712 Unilateral primary osteoarthritis, left knee: Secondary | ICD-10-CM | POA: Diagnosis not present

## 2016-05-05 DIAGNOSIS — E039 Hypothyroidism, unspecified: Secondary | ICD-10-CM | POA: Diagnosis not present

## 2016-05-05 DIAGNOSIS — I129 Hypertensive chronic kidney disease with stage 1 through stage 4 chronic kidney disease, or unspecified chronic kidney disease: Secondary | ICD-10-CM | POA: Diagnosis not present

## 2016-05-05 DIAGNOSIS — M545 Low back pain: Secondary | ICD-10-CM | POA: Diagnosis not present

## 2016-05-05 DIAGNOSIS — N183 Chronic kidney disease, stage 3 (moderate): Secondary | ICD-10-CM | POA: Diagnosis not present

## 2016-06-01 DIAGNOSIS — Z96652 Presence of left artificial knee joint: Secondary | ICD-10-CM | POA: Diagnosis not present

## 2016-07-29 DIAGNOSIS — I129 Hypertensive chronic kidney disease with stage 1 through stage 4 chronic kidney disease, or unspecified chronic kidney disease: Secondary | ICD-10-CM | POA: Diagnosis not present

## 2016-07-29 DIAGNOSIS — E7800 Pure hypercholesterolemia, unspecified: Secondary | ICD-10-CM | POA: Diagnosis not present

## 2016-07-29 DIAGNOSIS — M4800 Spinal stenosis, site unspecified: Secondary | ICD-10-CM | POA: Diagnosis not present

## 2016-07-29 DIAGNOSIS — D689 Coagulation defect, unspecified: Secondary | ICD-10-CM | POA: Diagnosis not present

## 2016-07-29 DIAGNOSIS — I10 Essential (primary) hypertension: Secondary | ICD-10-CM | POA: Diagnosis not present

## 2016-07-29 DIAGNOSIS — K767 Hepatorenal syndrome: Secondary | ICD-10-CM | POA: Diagnosis not present

## 2016-07-29 DIAGNOSIS — E039 Hypothyroidism, unspecified: Secondary | ICD-10-CM | POA: Diagnosis not present

## 2016-08-10 DIAGNOSIS — H903 Sensorineural hearing loss, bilateral: Secondary | ICD-10-CM | POA: Diagnosis not present

## 2016-08-11 ENCOUNTER — Other Ambulatory Visit: Payer: Self-pay | Admitting: Otolaryngology

## 2016-08-11 DIAGNOSIS — H903 Sensorineural hearing loss, bilateral: Secondary | ICD-10-CM

## 2016-08-24 ENCOUNTER — Ambulatory Visit
Admission: RE | Admit: 2016-08-24 | Discharge: 2016-08-24 | Disposition: A | Discharge status: Home / Self Care | Payer: Medicare Other | Source: Ambulatory Visit | Attending: Otolaryngology | Admitting: Otolaryngology

## 2016-08-24 DIAGNOSIS — H9313 Tinnitus, bilateral: Secondary | ICD-10-CM | POA: Diagnosis not present

## 2016-08-24 DIAGNOSIS — H903 Sensorineural hearing loss, bilateral: Secondary | ICD-10-CM

## 2016-08-24 MED ORDER — GADOBENATE DIMEGLUMINE 529 MG/ML IV SOLN
13.0000 mL | Freq: Once | INTRAVENOUS | Status: AC | PRN
  Administered 2016-08-24: 13 mL via INTRAVENOUS

## 2016-09-06 ENCOUNTER — Emergency Department (HOSPITAL_COMMUNITY)
Admission: EM | Admit: 2016-09-06 | Discharge: 2016-09-06 | Disposition: A | Discharge status: Home / Self Care | Payer: Medicare Other | Attending: Emergency Medicine | Admitting: Emergency Medicine

## 2016-09-06 ENCOUNTER — Emergency Department (HOSPITAL_COMMUNITY): Payer: Medicare Other

## 2016-09-06 ENCOUNTER — Encounter (HOSPITAL_COMMUNITY): Payer: Self-pay

## 2016-09-06 DIAGNOSIS — Y999 Unspecified external cause status: Secondary | ICD-10-CM | POA: Insufficient documentation

## 2016-09-06 DIAGNOSIS — S0990XA Unspecified injury of head, initial encounter: Secondary | ICD-10-CM

## 2016-09-06 DIAGNOSIS — S0101XA Laceration without foreign body of scalp, initial encounter: Secondary | ICD-10-CM | POA: Diagnosis not present

## 2016-09-06 DIAGNOSIS — Z7901 Long term (current) use of anticoagulants: Secondary | ICD-10-CM | POA: Diagnosis not present

## 2016-09-06 DIAGNOSIS — S00531A Contusion of lip, initial encounter: Secondary | ICD-10-CM | POA: Diagnosis not present

## 2016-09-06 DIAGNOSIS — I10 Essential (primary) hypertension: Secondary | ICD-10-CM | POA: Insufficient documentation

## 2016-09-06 DIAGNOSIS — E039 Hypothyroidism, unspecified: Secondary | ICD-10-CM | POA: Diagnosis not present

## 2016-09-06 DIAGNOSIS — G4489 Other headache syndrome: Secondary | ICD-10-CM | POA: Diagnosis not present

## 2016-09-06 DIAGNOSIS — S0181XA Laceration without foreign body of other part of head, initial encounter: Secondary | ICD-10-CM | POA: Diagnosis not present

## 2016-09-06 DIAGNOSIS — Y929 Unspecified place or not applicable: Secondary | ICD-10-CM | POA: Diagnosis not present

## 2016-09-06 DIAGNOSIS — T148 Other injury of unspecified body region: Secondary | ICD-10-CM | POA: Diagnosis not present

## 2016-09-06 DIAGNOSIS — S0083XA Contusion of other part of head, initial encounter: Secondary | ICD-10-CM | POA: Diagnosis not present

## 2016-09-06 DIAGNOSIS — Z96652 Presence of left artificial knee joint: Secondary | ICD-10-CM | POA: Diagnosis not present

## 2016-09-06 DIAGNOSIS — W109XXA Fall (on) (from) unspecified stairs and steps, initial encounter: Secondary | ICD-10-CM | POA: Insufficient documentation

## 2016-09-06 DIAGNOSIS — Y939 Activity, unspecified: Secondary | ICD-10-CM | POA: Insufficient documentation

## 2016-09-06 DIAGNOSIS — S199XXA Unspecified injury of neck, initial encounter: Secondary | ICD-10-CM | POA: Diagnosis not present

## 2016-09-06 DIAGNOSIS — S299XXA Unspecified injury of thorax, initial encounter: Secondary | ICD-10-CM | POA: Diagnosis not present

## 2016-09-06 DIAGNOSIS — S0003XA Contusion of scalp, initial encounter: Secondary | ICD-10-CM | POA: Diagnosis not present

## 2016-09-06 MED ORDER — LIDOCAINE-EPINEPHRINE 1 %-1:100000 IJ SOLN
10.0000 mL | Freq: Once | INTRAMUSCULAR | Status: AC
  Administered 2016-09-06: 10 mL
  Filled 2016-09-06: qty 1

## 2016-09-06 NOTE — ED Provider Notes (Signed)
[facility] DEPT Provider Note   CSN: [id number] Arrival date & time: 09/06/16  [id number]  By signing my name below, I, [name], attest that this documentation has been prepared under [name] direction and in [name] presence of Virgel Manifold, MD . Electronically Signed: [name], Scribe. 09/06/2016. 7:04 PM.  History   Chief Complaint Chief Complaint  Patient presents with  . Fall  . Bleeding/Bruising    [name] history is provided by [name] patient, a relative and [name] EMS personnel. No language interpreter was used.    HPI Comments:  [name] [name] is a 80 y.o. female brought in by ambulance, who presents to [name] s/p fall this afternoon ~ 1600 complaining of a head injury following [name] incident. Pt has a wound to [name] left forehead region; notes mild pain to [name] site at this time. Pt states she misstepped on [name] living room step which caused her to fall. She denies LOC. Family states she was bleeding profusely from wound site when he found her after [name] fall. Pt denies acute neck or back pain. She also denies abdominal pain and urinary symptoms. She is allergic to Tdap and has never had one. She has no other acute complaints or injuries at this time. Family states pt had an MRI ~ 2 weeks ago that showed pt had a stroke. Per Triage note pt admitted to having wine this afternoon.   Past Medical History:  Diagnosis Date  . Anemia    hx of years ago   . Anxiety   . ARF (acute renal failure) ([place]) 08/23/2014  . Arthritis    psoriatic arthritis  . Back pain   . Blood transfusion    at age of 4  . Depression   . GERD (gastroesophageal reflux disease)   . H/O hiatal hernia   . Hyperlipidemia   . Hypertension   . Hypothyroidism   . IBS (irritable bowel syndrome)   . Pancreatitis   . PONV (postoperative nausea and vomiting)   . Thyroid disease     Patient Active Problem List   Diagnosis Date Noted  . S/P total knee replacement 03/10/2016  . 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 Ileus ([place]) 09/05/2014  . Leukocytosis, unspecified 09/05/2014  . Nausea and vomiting 09/04/2014  . Pulmonary edema 09/04/2014  . Hypokalemia 08/28/2014  . Enteritis due to Clostridium difficile 08/27/2014  . 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 Protein-calorie malnutrition, severe ([place]) 08/24/2014  . Obstructive jaundice 08/23/2014  . Acute cholangitis 08/23/2014  . ARF (acute renal failure) ([place]) 08/23/2014  . Coagulopathy ([place]) 08/23/2014  . Back pain   . Arthritis   . Hypertension   . GERD (gastroesophageal reflux disease)   . Anxiety   . IBS (irritable bowel syndrome)   . Hernia of Right posterior flank 04/18/2012    Past Surgical History:  Procedure Laterality Date  . ABDOMINAL HYSTERECTOMY     partial  . BACK SURGERY  2010   thoractic-screws and rods  . BILE DUCT STENT PLACEMENT  08/26/2014  . BREAST REDUCTION SURGERY    . CHOLECYSTECTOMY  1988   lap  . COLONOSCOPY    . COLONOSCOPY Left 09/08/2014   Procedure: COLONOSCOPY;  Surgeon: Arta Silence, MD;  Location: [facility] ENDOSCOPY;  Service: Endoscopy;  Laterality: Left;  . ERCP    . ERCP N/A 08/26/2014   Procedure: ENDOSCOPIC RETROGRADE CHOLANGIOPANCREATOGRAPHY (ERCP);  Surgeon: Jeryl Columbia, MD;  Location: [facility] ENDOSCOPY;  Service: Endoscopy;  Laterality:  N/A;  . ERCP N/A 09/11/2014   Procedure: ENDOSCOPIC RETROGRADE CHOLANGIOPANCREATOGRAPHY (ERCP);  Surgeon: Arta Silence, MD;  Location: [facility] ENDOSCOPY;  Service: Endoscopy;  Laterality: N/A;  . EUS N/A 09/11/2014   Procedure: ESOPHAGEAL ENDOSCOPIC ULTRASOUND (EUS) RADIAL;  Surgeon: Arta Silence, MD;  Location: [facility] ENDOSCOPY;  Service: Endoscopy;  Laterality: N/A;  . JOINT REPLACEMENT  2005   left shoulder replacement   . ORIF CLAVICULAR FRACTURE Left 07/18/2014   Procedure: OPEN REDUCTION INTERNAL FIXATION (ORIF) LEFT CLAVICULAR FRACTURE;  Surgeon: Ninetta Lights, MD;  Location: [facility];  Service: Orthopedics;  Laterality: Left;  .  SPYGLASS CHOLANGIOSCOPY N/A 09/11/2014   Procedure: SPYGLASS CHOLANGIOSCOPY;  Surgeon: Arta Silence, MD;  Location: [facility] ENDOSCOPY;  Service: Endoscopy;  Laterality: N/A;  . TONSILLECTOMY  as child  . TOTAL KNEE ARTHROPLASTY Left 03/10/2016   Procedure: LEFT TOTAL KNEE ARTHROPLASTY;  Surgeon: Ninetta Lights, MD;  Location: [facility];  Service: Orthopedics;  Laterality: Left;  [name] VENTRAL HERNIA REPAIR  06/08/2012   Procedure: LAPAROSCOPIC VENTRAL HERNIA;  Surgeon: Adin Hector, MD;  Location: [facility] ORS;  Service: General;  Laterality: Right;    OB History    No data available       Home Medications    Prior to Admission medications   Medication Sig Start Date End Date Taking? Authorizing Provider  ALPRAZolam Duanne Moron) 1 MG tablet Take 1 mg by mouth 4 (four) times daily as needed for anxiety.     Historical Provider, MD  apixaban (ELIQUIS) 2.5 MG TABS tablet Take 1 tab po q12 hours x 14 days following surgery to prevent blood clots 03/10/16   Aundra Dubin, PA-C  atenolol (TENORMIN) 50 MG tablet Take 50 mg by mouth every morning.    Historical Provider, MD  fenofibrate (TRICOR) 145 MG tablet Take 145 mg by mouth daily with breakfast.     Historical Provider, MD  furosemide (LASIX) 20 MG tablet Take 20 mg by mouth daily with breakfast.     Historical Provider, MD  hyoscyamine (LEVBID) 0.375 MG 12 hr tablet Take 0.375 mg by mouth every 12 (twelve) hours as needed for cramping.     Historical Provider, MD  levothyroxine (SYNTHROID, LEVOTHROID) 100 MCG tablet Take 100 mcg by mouth daily before breakfast.    Historical Provider, MD  Multiple Vitamin (MULTIVITAMIN WITH MINERALS) TABS tablet Take 1 tablet by mouth daily.    Historical Provider, MD  ondansetron (ZOFRAN) 4 MG tablet Take 1 tablet (4 mg total) by mouth every 8 (eight) hours as needed for nausea or vomiting. 03/10/16   Aundra Dubin, PA-C  oxyCODONE-acetaminophen (ROXICET) 5-325 MG tablet Take 1-2 tablets by mouth every 4 (four) hours as  needed. 03/10/16   Aundra Dubin, PA-C  potassium chloride SA (K-DUR,KLOR-CON) 20 MEQ tablet Take 20 mEq by mouth daily.     Historical Provider, MD  venlafaxine XR (EFFEXOR-XR) 75 MG 24 hr capsule Take 75 mg by mouth 2 (two) times daily.    Historical Provider, MD  zolpidem (AMBIEN) 10 MG tablet Take 10 mg by mouth at bedtime.     Historical Provider, MD    Family History Family History  Problem Relation Age of Onset  . Heart disease Mother   . Cancer Father     lung    Social History Social History  Substance Use Topics  . Smoking status: Never Smoker  . Smokeless tobacco: Never Used  . Alcohol use Yes     Comment:  rare     Allergies   Tetanus toxoids and Yellow dyes (non-tartrazine)   Review of Systems Review of Systems  Gastrointestinal: Negative for abdominal pain.  Genitourinary: Negative for dysuria, flank pain, hematuria and urgency.  Skin: Positive for wound.  Neurological: Negative for syncope and numbness.  All other systems reviewed and are negative.    Physical Exam Updated Vital Signs There were no vitals taken for this visit.  Physical Exam  Constitutional: She is oriented to person, place, and time. She appears well-developed and well-nourished. No distress.  HENT:  Head: Normocephalic and atraumatic.  Eyes: Conjunctivae are normal.  Cardiovascular: Normal rate.   Pulmonary/Chest: Effort normal.  Abdominal: She exhibits no distension.  Musculoskeletal: Normal range of motion.  Neurological: She is alert and oriented to person, place, and time.  Slow to respond to questions   Difficulty following along with EOM   CN otherwise seems intact  Normal strength and sensation in all extremities     Skin: Skin is warm and dry.  Multiple areas of ecchymosis to face and head Bruising on right cheek looks subacute  Large hematoma left forehead with 1 cm laceration overlying it    Psychiatric: She has a normal mood and affect.  Nursing note and vitals  reviewed.  ED Treatments / Results  DIAGNOSTIC STUDIES:  Oxygen Saturation is 99% on [place], normal by my interpretation.    COORDINATION OF CARE:  6:55 PM Discussed treatment plan with pt at bedside and pt agreed to plan.  Labs (all labs ordered are listed, but only abnormal results are displayed) Labs Reviewed - No data to display  EKG  EKG Interpretation None       Radiology Dg Chest 2 View  Result Date: 09/06/2016 CLINICAL DATA:  Fall from standing around 1600 hours today. Facial injuries. Stroke 2 weeks ago. EXAM: CHEST  2 VIEW COMPARISON:  Chest x-ray dated 09/04/2014. FINDINGS: Heart size is upper normal, stable. Overall cardiomediastinal silhouette appears stable in size and configuration. Lungs are clear. No pleural effusion or pneumothorax seen. Plate and screw fixation hardware within [name] distal left clavicle appears stable in position. Left shoulder prosthesis appears stable in position. Fixation hardware within [name] thoracolumbar spine is incompletely imaged but with visualized portions appearing intact and stable in alignment. IMPRESSION: No acute findings. Electronically Signed   By: Franki Cabot M.D.   On: 09/06/2016 20:01   Ct Head Wo Contrast  Result Date: 09/06/2016 CLINICAL DATA:  80 y/o [name]; fell down steps with hematoma of left forehead, bilateral black eyes, and bruising around [name] lips. EXAM: CT HEAD WITHOUT CONTRAST CT MAXILLOFACIAL WITHOUT CONTRAST CT CERVICAL SPINE WITHOUT CONTRAST TECHNIQUE: Multidetector CT imaging of [name] head, cervical spine, and maxillofacial structures were performed using [name] standard protocol without intravenous contrast. Multiplanar CT image reconstructions of [name] cervical spine and maxillofacial structures were also generated. COMPARISON:  08/24/2016 MRI brain. 08/23/2016 CT of head and cervical spine. FINDINGS: CT HEAD FINDINGS Brain: No evidence of acute infarction, hemorrhage, hydrocephalus, extra-axial collection or mass lesion/mass  effect. Vascular: No hyperdense vessel. Moderate calcific atherosclerosis of cavernous internal carotid arteries. Skull: Large left frontal scalp contusion and hematoma. No displaced calvarial fracture is identified. Sinuses/Orbits: [name] bilateral intra-ocular lens replacement. Other: None. CT MAXILLOFACIAL FINDINGS Osseous: No fracture or mandibular dislocation. No destructive process. Orbits: Negative. No traumatic or inflammatory finding. Sinuses: Clear. Soft tissues: Left frontal scalp contusion and hematoma. Soft tissue swelling overlying [name] mandible. Limited intracranial: No significant or unexpected finding. CT  CERVICAL SPINE FINDINGS Alignment: Normal cervical lordosis. Grade 1 C4-5 anterolisthesis is stable. Skull base and vertebrae: No acute fracture. No primary bone lesion or focal pathologic process. Soft tissues and spinal canal: No prevertebral fluid or swelling. No visible canal hematoma. Paraspinal muscles are unremarkable. Patent aerodigestive tract. No lymphadenopathy or discrete cervical mass is identified on this noncontrast examination. Mild calcific atherosclerosis of carotid bifurcations bilaterally. Disc levels: Cervical spondylosis is greatest at [name] C5 through C7 levels where there are endplate degenerative changes and disc space narrowing. Additionally there is upper cervical facet arthropathy, right greater than left, from C3 through C5. Degenerative changes result in mild bilateral C6-7 and moderate left-sided C3-4 bony foraminal narrowing. No high-grade bony canal stenosis. Upper chest: Mosaic attenuation, question mild edema. Other: None. IMPRESSION: 1. No acute intracranial abnormality. 2. Large left frontal scalp contusion and hematoma. No displaced calvarial fracture. 3. No facial fracture or mandibular dislocation. 4. No cervical fracture or malalignment. 5. Stable cervical spondylosis greatest at C5 through C7. Electronically Signed   By: Kristine Garbe M.D.   On:  09/06/2016 21:14   Ct Cervical Spine Wo Contrast  Result Date: 09/06/2016 CLINICAL DATA:  80 y/o [name]; fell down steps with hematoma of left forehead, bilateral black eyes, and bruising around [name] lips. EXAM: CT HEAD WITHOUT CONTRAST CT MAXILLOFACIAL WITHOUT CONTRAST CT CERVICAL SPINE WITHOUT CONTRAST TECHNIQUE: Multidetector CT imaging of [name] head, cervical spine, and maxillofacial structures were performed using [name] standard protocol without intravenous contrast. Multiplanar CT image reconstructions of [name] cervical spine and maxillofacial structures were also generated. COMPARISON:  08/24/2016 MRI brain. 08/23/2016 CT of head and cervical spine. FINDINGS: CT HEAD FINDINGS Brain: No evidence of acute infarction, hemorrhage, hydrocephalus, extra-axial collection or mass lesion/mass effect. Vascular: No hyperdense vessel. Moderate calcific atherosclerosis of cavernous internal carotid arteries. Skull: Large left frontal scalp contusion and hematoma. No displaced calvarial fracture is identified. Sinuses/Orbits: [name] bilateral intra-ocular lens replacement. Other: None. CT MAXILLOFACIAL FINDINGS Osseous: No fracture or mandibular dislocation. No destructive process. Orbits: Negative. No traumatic or inflammatory finding. Sinuses: Clear. Soft tissues: Left frontal scalp contusion and hematoma. Soft tissue swelling overlying [name] mandible. Limited intracranial: No significant or unexpected finding. CT CERVICAL SPINE FINDINGS Alignment: Normal cervical lordosis. Grade 1 C4-5 anterolisthesis is stable. Skull base and vertebrae: No acute fracture. No primary bone lesion or focal pathologic process. Soft tissues and spinal canal: No prevertebral fluid or swelling. No visible canal hematoma. Paraspinal muscles are unremarkable. Patent aerodigestive tract. No lymphadenopathy or discrete cervical mass is identified on this noncontrast examination. Mild calcific atherosclerosis of carotid bifurcations bilaterally. Disc levels:  Cervical spondylosis is greatest at [name] C5 through C7 levels where there are endplate degenerative changes and disc space narrowing. Additionally there is upper cervical facet arthropathy, right greater than left, from C3 through C5. Degenerative changes result in mild bilateral C6-7 and moderate left-sided C3-4 bony foraminal narrowing. No high-grade bony canal stenosis. Upper chest: Mosaic attenuation, question mild edema. Other: None. IMPRESSION: 1. No acute intracranial abnormality. 2. Large left frontal scalp contusion and hematoma. No displaced calvarial fracture. 3. No facial fracture or mandibular dislocation. 4. No cervical fracture or malalignment. 5. Stable cervical spondylosis greatest at C5 through C7. Electronically Signed   By: Kristine Garbe M.D.   On: 09/06/2016 21:14   Ct Maxillofacial Wo Contrast  Result Date: 09/06/2016 CLINICAL DATA:  80 y/o [name]; fell down steps with hematoma of left forehead, bilateral black eyes, and bruising around [name] lips. EXAM: CT HEAD  WITHOUT CONTRAST CT MAXILLOFACIAL WITHOUT CONTRAST CT CERVICAL SPINE WITHOUT CONTRAST TECHNIQUE: Multidetector CT imaging of [name] head, cervical spine, and maxillofacial structures were performed using [name] standard protocol without intravenous contrast. Multiplanar CT image reconstructions of [name] cervical spine and maxillofacial structures were also generated. COMPARISON:  08/24/2016 MRI brain. 08/23/2016 CT of head and cervical spine. FINDINGS: CT HEAD FINDINGS Brain: No evidence of acute infarction, hemorrhage, hydrocephalus, extra-axial collection or mass lesion/mass effect. Vascular: No hyperdense vessel. Moderate calcific atherosclerosis of cavernous internal carotid arteries. Skull: Large left frontal scalp contusion and hematoma. No displaced calvarial fracture is identified. Sinuses/Orbits: [name] bilateral intra-ocular lens replacement. Other: None. CT MAXILLOFACIAL FINDINGS Osseous: No fracture or mandibular dislocation. No  destructive process. Orbits: Negative. No traumatic or inflammatory finding. Sinuses: Clear. Soft tissues: Left frontal scalp contusion and hematoma. Soft tissue swelling overlying [name] mandible. Limited intracranial: No significant or unexpected finding. CT CERVICAL SPINE FINDINGS Alignment: Normal cervical lordosis. Grade 1 C4-5 anterolisthesis is stable. Skull base and vertebrae: No acute fracture. No primary bone lesion or focal pathologic process. Soft tissues and spinal canal: No prevertebral fluid or swelling. No visible canal hematoma. Paraspinal muscles are unremarkable. Patent aerodigestive tract. No lymphadenopathy or discrete cervical mass is identified on this noncontrast examination. Mild calcific atherosclerosis of carotid bifurcations bilaterally. Disc levels: Cervical spondylosis is greatest at [name] C5 through C7 levels where there are endplate degenerative changes and disc space narrowing. Additionally there is upper cervical facet arthropathy, right greater than left, from C3 through C5. Degenerative changes result in mild bilateral C6-7 and moderate left-sided C3-4 bony foraminal narrowing. No high-grade bony canal stenosis. Upper chest: Mosaic attenuation, question mild edema. Other: None. IMPRESSION: 1. No acute intracranial abnormality. 2. Large left frontal scalp contusion and hematoma. No displaced calvarial fracture. 3. No facial fracture or mandibular dislocation. 4. No cervical fracture or malalignment. 5. Stable cervical spondylosis greatest at C5 through C7. Electronically Signed   By: Kristine Garbe M.D.   On: 09/06/2016 21:14    Procedures Procedures (including critical care time)  LACERATION REPAIR Performed by: Virgel Manifold Authorized by: Virgel Manifold Consent: Verbal consent obtained. Risks and benefits: risks, benefits and alternatives were discussed Consent given by: patient Patient identity confirmed: provided demographic data Prepped and Draped in normal  sterile fashion Wound explored  Laceration Location: forehead  Laceration Length: 1 cm  No Foreign Bodies seen or palpated  Anesthesia: local infiltration  Local anesthetic: lidocaine 1% with  Anesthetic total: 2 ml  Irrigation method: syringe Amount of cleaning: standard  Skin closure: 5-0 prolene  Number of sutures: 2  Technique: simple interupted Patient tolerance: Patient tolerated [name] procedure well with no immediate complications.   Medications Ordered in ED Medications - No data to display   Initial Impression / Assessment and Plan / ED Course  I have reviewed [name] triage vital signs and [name] nursing notes.  Pertinent labs & imaging results that were available during my care of [name] patient were reviewed by me and considered in my medical decision making (see chart for details).  Clinical Course     Final Clinical Impressions(s) / ED Diagnoses   Final diagnoses:  Head injury, initial encounter  Facial contusion, initial encounter  Scalp laceration, initial encounter    New Prescriptions New Prescriptions   No medications on file   I personally preformed [name] services scribed in my presence. [name] recorded information has been reviewed is accurate. Virgel Manifold, MD.     Virgel Manifold, MD 09/08/16 [phone]

## 2016-09-06 NOTE — ED Triage Notes (Signed)
Per EMS: Pt admits to ETOH this afternoon (wine). Pt states missed step in living room and fell. Pt struck L side of forehead and back of head. Pt with new hematomas to head and L lip. OLD hematoma to R cheek. Pt a/o x 4. Some slurred speech and repetative questioning upon arrival.

## 2016-09-23 ENCOUNTER — Ambulatory Visit (INDEPENDENT_AMBULATORY_CARE_PROVIDER_SITE_OTHER): Payer: Medicare Other | Admitting: Neurology

## 2016-09-23 ENCOUNTER — Encounter: Payer: Self-pay | Admitting: Neurology

## 2016-09-23 VITALS — BP 141/75 | HR 55 | Ht 61.0 in | Wt 141.0 lb

## 2016-09-23 DIAGNOSIS — E784 Other hyperlipidemia: Secondary | ICD-10-CM | POA: Diagnosis not present

## 2016-09-23 DIAGNOSIS — I63012 Cerebral infarction due to thrombosis of left vertebral artery: Secondary | ICD-10-CM | POA: Diagnosis not present

## 2016-09-23 DIAGNOSIS — E7849 Other hyperlipidemia: Secondary | ICD-10-CM

## 2016-09-23 DIAGNOSIS — E0800 Diabetes mellitus due to underlying condition with hyperosmolarity without nonketotic hyperglycemic-hyperosmolar coma (NKHHC): Secondary | ICD-10-CM | POA: Diagnosis not present

## 2016-09-23 DIAGNOSIS — I6930 Unspecified sequelae of cerebral infarction: Secondary | ICD-10-CM | POA: Diagnosis not present

## 2016-09-23 NOTE — Progress Notes (Signed)
[facility] [address] [place]. [place] [address] [phone]       OFFICE CONSULT NOTE  [name]. [name] [name] Date of Birth:  11-12-1936 Medical Record Number:  [id number]   Referring MD:  Vicie Mutters  Reason for Referral:  Lacunar infarcts HPI: [name] [name] is a 6 year pleasant Caucasian lady who was recently found to have silent lacunar infarcts on brain MRI scan performed 08/24/16 as per evaluation for her hearing loss. I personally reviewed the images but showed tiny remote lacunar infarct in the left cerebellar white matter as well as bilateral basal ganglia. Mild age-related changes of chronic microvascular ischemia and generalized atrophy as well. Patient denies any clinical episodes suggestive of a stroke or TIA. She specifically denies any double vision, vertigo, extremity weakness, numbness. She does have some chronic hearing loss and ringing in the ears for the last 2-3 years for which she was evaluated by Dr. Vicie Mutters from ENT. Audiometry evaluation showed moderate sensory neural hearing loss left greater than right. MRI scan of the brain with internal auditory canal sections did not show any structural vascular lesion. Patient states that she had some chronic balance difficulties. She in fact tripped on 09/06/16 at home and felt she was seen in the ER and was found to have left frontal scalp hematoma requiring 2 stitches. She recovered from that and has now resolving bruises on her cheeks and forehead. The patient has high blood pressure but denies known history of diabetes or hyperlipidemia or smoking. She has not been referred to physical therapy for gait or balance but would like a referral. ROS:   14 system review of systems is positive for  hearing loss, easy bruising, joint pain, ringing in the ears, aching muscles, anxiety, depression, not enough sleep, decreased energy, disinterest in activities, racing thoughts, snoring, insomnia and all the systems  negative  PMH:  Past Medical History:  Diagnosis Date  . Anemia    hx of years ago   . Anxiety   . ARF (acute renal failure) ([place]) 08/23/2014  . Arthritis    psoriatic arthritis  . Back pain   . Blood transfusion    at age of 4  . Depression   . GERD (gastroesophageal reflux disease)   . H/O hiatal hernia   . Hyperlipidemia   . Hypertension   . Hypothyroidism   . IBS (irritable bowel syndrome)   . Pancreatitis   . PONV (postoperative nausea and vomiting)   . Thyroid disease     Social History:  Social History   Social History  . Marital status: Married    Spouse name: N/A  . Number of children: N/A  . Years of education: N/A   Occupational History  . Not on file.   Social History Main Topics  . Smoking status: Never Smoker  . Smokeless tobacco: Never Used  . Alcohol use 0.6 oz/week    1 Glasses of wine per week     Comment: rare  . Drug use: No  . Sexual activity: Not on file   Other Topics Concern  . Not on file   Social History Narrative  . No narrative on file    Medications:   Current Outpatient Prescriptions on File Prior to Visit  Medication Sig Dispense Refill  . ALPRAZolam (XANAX) 1 MG tablet Take 1 mg by mouth 4 (four) times daily as needed for anxiety.     [name] atenolol (TENORMIN) 50 MG tablet Take 50 mg by  mouth every morning.    [name] esomeprazole (NEXIUM) 40 MG capsule Take 40 mg by mouth daily.    . furosemide (LASIX) 20 MG tablet Take 20 mg by mouth daily with breakfast.     . gabapentin (NEURONTIN) 400 MG capsule Take 400 mg by mouth 6 (six) times daily. As needed for nerve pain    . hyoscyamine (LEVBID) 0.375 MG 12 hr tablet Take 0.375 mg by mouth every 12 (twelve) hours as needed for cramping.     [name] levothyroxine (SYNTHROID, LEVOTHROID) 75 MCG tablet Take 75 mcg by mouth daily before breakfast.     . Multiple Vitamin (MULTIVITAMIN WITH MINERALS) TABS tablet Take 1 tablet by mouth daily as needed (for supplementation).     . potassium chloride  SA (K-DUR,KLOR-CON) 20 MEQ tablet Take 40 mEq by mouth daily.     [name] SILENOR 6 MG TABS Take 6 mg by mouth at bedtime.    . simvastatin (ZOCOR) 10 MG tablet Take 10 mg by mouth at bedtime.    [name] tiZANidine (ZANAFLEX) 4 MG tablet Take 4 mg by mouth 3 (three) times daily as needed for muscle spasms.     [name] venlafaxine XR (EFFEXOR-XR) 75 MG 24 hr capsule Take 75 mg by mouth daily with breakfast.     . zolpidem (AMBIEN) 5 MG tablet Take 5 mg by mouth at bedtime.     No current facility-administered medications on file prior to visit.     Allergies:   Allergies  Allergen Reactions  . Tetanus Toxoids Swelling  . Yellow Dyes (Non-Tartrazine) Itching and Other (See Comments)    Makes patient nervous     Physical Exam General: Frail elderly Caucasian lady, seated, in no evident distress Head: head normocephalic and atraumatic.   Neck: supple with no carotid or supraclavicular bruits Cardiovascular: regular rate and rhythm, no murmurs Musculoskeletal: no deformity mild kyphoscoliosis Skin:  no rash.Ecchymosis bilateral cheeks and left forehand. Soft tissue swelling from healing scalp hematoma on the left forehand with scab and stitches Vascular:  Normal pulses all extremities  Neurologic Exam Mental Status: Awake and fully alert. Oriented to place and time. Recent and remote memory intact. Attention span, concentration and fund of knowledge appropriate. Mood and affect appropriate.  Cranial Nerves: Fundoscopic exam reveals sharp disc margins. Pupils equal, briskly reactive to light. Extraocular movements full without nystagmus. Visual fields full to confrontation. Hearing is reduced bilaterally right greater than left. Facial sensation intact. Face, tongue, palate moves normally and symmetrically.  Motor: Normal bulk and tone. Normal strength in all tested extremity muscles. Sensory.: intact to touch , pinprick , position and vibratory sensation.  Coordination: Rapid alternating movements normal in  all extremities. Finger-to-nose and heel-to-shin performed accurately bilaterally. Gait and Station: Arises from chair without difficulty. Stance is normal. Gait demonstrates normal stride length and balance . Unable to heel, toe and tandem walk without difficulty.  Reflexes: 1+ and symmetric. Toes downgoing.   NIHSS  0 Modified Rankin  1   ASSESSMENT: 21 year Caucasian lady with a incidental finding of a silent lacunar infarcts on brain MRI scan. Vascular risk factors of hypertension and age. Recent fall with scalp and face hematomas with history of poor balance and hearing loss    PLAN: I had a long d/w patient and her husband about her recent daignosis of silent lacunar stroke, risk for recurrent stroke/TIAs, personally independently reviewed imaging studies and stroke evaluation results and answered questions.Start  aspirin 81 mg daily  for secondary stroke  prevention and maintain strict control of hypertension with blood pressure goal below 130/90, diabetes with hemoglobin A1c goal below 6.5% and lipids with LDL cholesterol goal below 70 mg/dL. I also advised the patient to eat a healthy diet with plenty of whole grains, cereals, fruits and vegetables, exercise regularly and maintain ideal body weight .refer to outpatient physical and occupational therapy for gait and balance training. I advised her fall prevention precautions. Check MRA of the brain and neck, transthoracic echocardiogram, fasting lipid profile and A1c. The patient requested removal of  sutures from stitches she had got on  her forehead from ER visit from 2 weeks ago. This was performed without incident by my nurse practitioner Ward Givens. Greater than 50% time during this 50 minute visit was spent on counseling and coordination of care about lacunar infarcts, gait and balance Followup in the future with me in 2 months or call earlier if necessary Antony Contras, MD  [facility] [address] [place], [place] [address]  Phone [phone] Fax [phone] Note: This document was prepared with digital dictation and possible smart phrase technology. Any transcriptional errors that result from this process are unintentional.

## 2016-09-23 NOTE — Patient Instructions (Signed)
I had a long d/w patient and her husband about her recent daignosis of silent lacunar stroke, risk for recurrent stroke/TIAs, personally independently reviewed imaging studies and stroke evaluation results and answered questions.Start  aspirin 81 mg daily  for secondary stroke prevention and maintain strict control of hypertension with blood pressure goal below 130/90, diabetes with hemoglobin A1c goal below 6.5% and lipids with LDL cholesterol goal below 70 mg/dL. I also advised the patient to eat a healthy diet with plenty of whole grains, cereals, fruits and vegetables, exercise regularly and maintain ideal body weight .refer to outpatient physical and occupational therapy for gait and balance training. I advised her fall prevention precautions. Check MRA of the brain and neck, transthoracic echocardiogram, fasting lipid profile and A1c. Followup in the future with me in 2 months or call earlier if necessary   Fall Prevention in the Home  Falls can cause injuries. They can happen to people of all ages. There are many things you can do to make your home safe and to help prevent falls.  WHAT CAN I DO ON THE OUTSIDE OF MY HOME?  Regularly fix the edges of walkways and driveways and fix any cracks.  Remove anything that might make you trip as you walk through a door, such as a raised step or threshold.  Trim any bushes or trees on the path to your home.  Use bright outdoor lighting.  Clear any walking paths of anything that might make someone trip, such as rocks or tools.  Regularly check to see if handrails are loose or broken. Make sure that both sides of any steps have handrails.  Any raised decks and porches should have guardrails on the edges.  Have any leaves, snow, or ice cleared regularly.  Use sand or salt on walking paths during winter.  Clean up any spills in your garage right away. This includes oil or grease spills. WHAT CAN I DO IN THE BATHROOM?   Use night lights.  Install  grab bars by the toilet and in the tub and shower. Do not use towel bars as grab bars.  Use non-skid mats or decals in the tub or shower.  If you need to sit down in the shower, use a plastic, non-slip stool.  Keep the floor dry. Clean up any water that spills on the floor as soon as it happens.  Remove soap buildup in the tub or shower regularly.  Attach bath mats securely with double-sided non-slip rug tape.  Do not have throw rugs and other things on the floor that can make you trip. WHAT CAN I DO IN THE BEDROOM?  Use night lights.  Make sure that you have a light by your bed that is easy to reach.  Do not use any sheets or blankets that are too big for your bed. They should not hang down onto the floor.  Have a firm chair that has side arms. You can use this for support while you get dressed.  Do not have throw rugs and other things on the floor that can make you trip. WHAT CAN I DO IN THE KITCHEN?  Clean up any spills right away.  Avoid walking on wet floors.  Keep items that you use a lot in easy-to-reach places.  If you need to reach something above you, use a strong step stool that has a grab bar.  Keep electrical cords out of the way.  Do not use floor polish or wax that makes floors slippery. If  you must use wax, use non-skid floor wax.  Do not have throw rugs and other things on the floor that can make you trip. WHAT CAN I DO WITH MY STAIRS?  Do not leave any items on the stairs.  Make sure that there are handrails on both sides of the stairs and use them. Fix handrails that are broken or loose. Make sure that handrails are as long as the stairways.  Check any carpeting to make sure that it is firmly attached to the stairs. Fix any carpet that is loose or worn.  Avoid having throw rugs at the top or bottom of the stairs. If you do have throw rugs, attach them to the floor with carpet tape.  Make sure that you have a light switch at the top of the stairs and  the bottom of the stairs. If you do not have them, ask someone to add them for you. WHAT ELSE CAN I DO TO HELP PREVENT FALLS?  Wear shoes that:  Do not have high heels.  Have rubber bottoms.  Are comfortable and fit you well.  Are closed at the toe. Do not wear sandals.  If you use a stepladder:  Make sure that it is fully opened. Do not climb a closed stepladder.  Make sure that both sides of the stepladder are locked into place.  Ask someone to hold it for you, if possible.  Clearly mark and make sure that you can see:  Any grab bars or handrails.  First and last steps.  Where the edge of each step is.  Use tools that help you move around (mobility aids) if they are needed. These include:  Canes.  Walkers.  Scooters.  Crutches.  Turn on the lights when you go into a dark area. Replace any light bulbs as soon as they burn out.  Set up your furniture so you have a clear path. Avoid moving your furniture around.  If any of your floors are uneven, fix them.  If there are any pets around you, be aware of where they are.  Review your medicines with your doctor. Some medicines can make you feel dizzy. This can increase your chance of falling. Ask your doctor what other things that you can do to help prevent falls.   This information is not intended to replace advice given to you by your health care provider. Make sure you discuss any questions you have with your health care provider.   Document Released: 09/25/2009 Document Revised: 04/15/2015 Document Reviewed: 01/03/2015 Elsevier Interactive Patient Education [employer].

## 2016-09-30 DIAGNOSIS — M85852 Other specified disorders of bone density and structure, left thigh: Secondary | ICD-10-CM | POA: Diagnosis not present

## 2016-09-30 DIAGNOSIS — M8588 Other specified disorders of bone density and structure, other site: Secondary | ICD-10-CM | POA: Diagnosis not present

## 2016-10-04 DIAGNOSIS — W19XXXD Unspecified fall, subsequent encounter: Secondary | ICD-10-CM | POA: Diagnosis not present

## 2016-10-04 DIAGNOSIS — R269 Unspecified abnormalities of gait and mobility: Secondary | ICD-10-CM | POA: Diagnosis not present

## 2016-10-04 DIAGNOSIS — I639 Cerebral infarction, unspecified: Secondary | ICD-10-CM | POA: Diagnosis not present

## 2016-10-04 DIAGNOSIS — M545 Low back pain: Secondary | ICD-10-CM | POA: Diagnosis not present

## 2016-10-04 DIAGNOSIS — I129 Hypertensive chronic kidney disease with stage 1 through stage 4 chronic kidney disease, or unspecified chronic kidney disease: Secondary | ICD-10-CM | POA: Diagnosis not present

## 2016-10-04 DIAGNOSIS — M4800 Spinal stenosis, site unspecified: Secondary | ICD-10-CM | POA: Diagnosis not present

## 2016-10-04 DIAGNOSIS — K767 Hepatorenal syndrome: Secondary | ICD-10-CM | POA: Diagnosis not present

## 2016-10-04 DIAGNOSIS — I10 Essential (primary) hypertension: Secondary | ICD-10-CM | POA: Diagnosis not present

## 2016-10-04 DIAGNOSIS — Z23 Encounter for immunization: Secondary | ICD-10-CM | POA: Diagnosis not present

## 2016-10-04 DIAGNOSIS — E039 Hypothyroidism, unspecified: Secondary | ICD-10-CM | POA: Diagnosis not present

## 2016-10-19 ENCOUNTER — Ambulatory Visit
Admission: RE | Admit: 2016-10-19 | Discharge: 2016-10-19 | Disposition: A | Discharge status: Home / Self Care | Payer: Medicare Other | Source: Ambulatory Visit | Attending: Neurology | Admitting: Neurology

## 2016-10-19 DIAGNOSIS — I63012 Cerebral infarction due to thrombosis of left vertebral artery: Secondary | ICD-10-CM

## 2016-10-19 DIAGNOSIS — I6930 Unspecified sequelae of cerebral infarction: Secondary | ICD-10-CM

## 2016-10-19 DIAGNOSIS — G468 Other vascular syndromes of brain in cerebrovascular diseases: Secondary | ICD-10-CM | POA: Diagnosis not present

## 2016-10-19 DIAGNOSIS — I6522 Occlusion and stenosis of left carotid artery: Secondary | ICD-10-CM | POA: Diagnosis not present

## 2016-10-19 MED ORDER — GADOBENATE DIMEGLUMINE 529 MG/ML IV SOLN
7.0000 mL | Freq: Once | INTRAVENOUS | Status: AC | PRN
  Administered 2016-10-19: 7 mL via INTRAVENOUS

## 2016-10-29 ENCOUNTER — Other Ambulatory Visit: Payer: Self-pay | Admitting: Neurology

## 2016-10-29 ENCOUNTER — Telehealth: Payer: Self-pay | Admitting: Neurology

## 2016-10-29 DIAGNOSIS — I6522 Occlusion and stenosis of left carotid artery: Secondary | ICD-10-CM | POA: Insufficient documentation

## 2016-10-29 NOTE — Telephone Encounter (Signed)
I called patient and gave her results of MRA of the neck showing severe proximal left ICA stenosis and MRA of the brain showing no significant stenosis. I recommend she see a vascular surgeon for elective left carotid endarterectomy. She voiced understanding. I placed a referral to vascular surgery

## 2016-10-29 NOTE — Telephone Encounter (Signed)
Please check with Dr. Leonie Man before call patient MRA report  Abnormal MRA of the neck w/wo contrast showing high-grade proximal left ICA stenosis of 90% with preserved distal flow. Both vertebral arteries have antegrade flow.  No significant large vessel disease on MRA of brain

## 2016-10-29 NOTE — Telephone Encounter (Signed)
Patient called to request results of MRI, states she hasn't heard from anyone.

## 2016-10-29 NOTE — Telephone Encounter (Addendum)
I see that results are back.

## 2016-11-03 ENCOUNTER — Other Ambulatory Visit (HOSPITAL_COMMUNITY): Payer: Medicare Other

## 2016-11-08 ENCOUNTER — Other Ambulatory Visit (HOSPITAL_COMMUNITY): Payer: Medicare Other

## 2016-11-10 ENCOUNTER — Emergency Department (HOSPITAL_COMMUNITY): Payer: Medicare Other

## 2016-11-10 ENCOUNTER — Encounter (HOSPITAL_COMMUNITY): Payer: Self-pay | Admitting: Emergency Medicine

## 2016-11-10 ENCOUNTER — Inpatient Hospital Stay (HOSPITAL_COMMUNITY)
Admission: EM | Admit: 2016-11-10 | Discharge: 2016-11-18 | DRG: 897 | Disposition: A | Discharge status: Skilled Nursing Facility | Payer: Medicare Other | Attending: Internal Medicine | Admitting: Internal Medicine

## 2016-11-10 DIAGNOSIS — F10931 Alcohol use, unspecified with withdrawal delirium: Secondary | ICD-10-CM

## 2016-11-10 DIAGNOSIS — F419 Anxiety disorder, unspecified: Secondary | ICD-10-CM | POA: Diagnosis not present

## 2016-11-10 DIAGNOSIS — E039 Hypothyroidism, unspecified: Secondary | ICD-10-CM | POA: Diagnosis present

## 2016-11-10 DIAGNOSIS — K589 Irritable bowel syndrome without diarrhea: Secondary | ICD-10-CM | POA: Diagnosis present

## 2016-11-10 DIAGNOSIS — Z9102 Food additives allergy status: Secondary | ICD-10-CM

## 2016-11-10 DIAGNOSIS — R1110 Vomiting, unspecified: Secondary | ICD-10-CM

## 2016-11-10 DIAGNOSIS — F13931 Sedative, hypnotic or anxiolytic use, unspecified with withdrawal delirium: Secondary | ICD-10-CM

## 2016-11-10 DIAGNOSIS — R404 Transient alteration of awareness: Secondary | ICD-10-CM | POA: Diagnosis not present

## 2016-11-10 DIAGNOSIS — G9340 Encephalopathy, unspecified: Secondary | ICD-10-CM

## 2016-11-10 DIAGNOSIS — R410 Disorientation, unspecified: Secondary | ICD-10-CM | POA: Diagnosis not present

## 2016-11-10 DIAGNOSIS — Z781 Physical restraint status: Secondary | ICD-10-CM

## 2016-11-10 DIAGNOSIS — G4089 Other seizures: Secondary | ICD-10-CM | POA: Diagnosis present

## 2016-11-10 DIAGNOSIS — I679 Cerebrovascular disease, unspecified: Secondary | ICD-10-CM

## 2016-11-10 DIAGNOSIS — R4182 Altered mental status, unspecified: Secondary | ICD-10-CM | POA: Diagnosis present

## 2016-11-10 DIAGNOSIS — I110 Hypertensive heart disease with heart failure: Secondary | ICD-10-CM | POA: Diagnosis not present

## 2016-11-10 DIAGNOSIS — F13231 Sedative, hypnotic or anxiolytic dependence with withdrawal delirium: Secondary | ICD-10-CM | POA: Diagnosis present

## 2016-11-10 DIAGNOSIS — Z79899 Other long term (current) drug therapy: Secondary | ICD-10-CM

## 2016-11-10 DIAGNOSIS — F411 Generalized anxiety disorder: Secondary | ICD-10-CM | POA: Diagnosis present

## 2016-11-10 DIAGNOSIS — R509 Fever, unspecified: Secondary | ICD-10-CM

## 2016-11-10 DIAGNOSIS — Z96652 Presence of left artificial knee joint: Secondary | ICD-10-CM | POA: Diagnosis present

## 2016-11-10 DIAGNOSIS — Z8673 Personal history of transient ischemic attack (TIA), and cerebral infarction without residual deficits: Secondary | ICD-10-CM

## 2016-11-10 DIAGNOSIS — Z8249 Family history of ischemic heart disease and other diseases of the circulatory system: Secondary | ICD-10-CM

## 2016-11-10 DIAGNOSIS — R531 Weakness: Secondary | ICD-10-CM | POA: Diagnosis not present

## 2016-11-10 DIAGNOSIS — G40909 Epilepsy, unspecified, not intractable, without status epilepticus: Secondary | ICD-10-CM | POA: Diagnosis not present

## 2016-11-10 DIAGNOSIS — F10231 Alcohol dependence with withdrawal delirium: Secondary | ICD-10-CM | POA: Diagnosis not present

## 2016-11-10 DIAGNOSIS — E876 Hypokalemia: Secondary | ICD-10-CM | POA: Diagnosis not present

## 2016-11-10 DIAGNOSIS — Z9049 Acquired absence of other specified parts of digestive tract: Secondary | ICD-10-CM

## 2016-11-10 DIAGNOSIS — E860 Dehydration: Secondary | ICD-10-CM | POA: Diagnosis present

## 2016-11-10 DIAGNOSIS — R569 Unspecified convulsions: Secondary | ICD-10-CM

## 2016-11-10 DIAGNOSIS — K219 Gastro-esophageal reflux disease without esophagitis: Secondary | ICD-10-CM | POA: Diagnosis present

## 2016-11-10 DIAGNOSIS — I5032 Chronic diastolic (congestive) heart failure: Secondary | ICD-10-CM | POA: Diagnosis not present

## 2016-11-10 DIAGNOSIS — K5641 Fecal impaction: Secondary | ICD-10-CM | POA: Diagnosis present

## 2016-11-10 DIAGNOSIS — I160 Hypertensive urgency: Secondary | ICD-10-CM | POA: Diagnosis present

## 2016-11-10 DIAGNOSIS — F329 Major depressive disorder, single episode, unspecified: Secondary | ICD-10-CM | POA: Diagnosis present

## 2016-11-10 DIAGNOSIS — Z888 Allergy status to other drugs, medicaments and biological substances status: Secondary | ICD-10-CM

## 2016-11-10 DIAGNOSIS — I5042 Chronic combined systolic (congestive) and diastolic (congestive) heart failure: Secondary | ICD-10-CM | POA: Diagnosis present

## 2016-11-10 DIAGNOSIS — I161 Hypertensive emergency: Secondary | ICD-10-CM | POA: Diagnosis present

## 2016-11-10 DIAGNOSIS — Z96612 Presence of left artificial shoulder joint: Secondary | ICD-10-CM | POA: Diagnosis present

## 2016-11-10 DIAGNOSIS — E785 Hyperlipidemia, unspecified: Secondary | ICD-10-CM | POA: Diagnosis present

## 2016-11-10 DIAGNOSIS — I480 Paroxysmal atrial fibrillation: Secondary | ICD-10-CM

## 2016-11-10 HISTORY — DX: Chronic diastolic (congestive) heart failure: I50.32

## 2016-11-10 LAB — DIFFERENTIAL
BASOS ABS: 0 10*3/uL (ref 0.0–0.1)
BASOS PCT: 0 %
Eosinophils Absolute: 0.2 10*3/uL (ref 0.0–0.7)
Eosinophils Relative: 3 %
Lymphocytes Relative: 20 %
Lymphs Abs: 1.6 10*3/uL (ref 0.7–4.0)
MONO ABS: 0.7 10*3/uL (ref 0.1–1.0)
Monocytes Relative: 9 %
NEUTROS ABS: 5.4 10*3/uL (ref 1.7–7.7)
NEUTROS PCT: 68 %

## 2016-11-10 LAB — CBC
HCT: 37.7 % (ref 36.0–46.0)
Hemoglobin: 12.6 g/dL (ref 12.0–15.0)
MCH: 29.8 pg (ref 26.0–34.0)
MCHC: 33.4 g/dL (ref 30.0–36.0)
MCV: 89.1 fL (ref 78.0–100.0)
PLATELETS: 182 10*3/uL (ref 150–400)
RBC: 4.23 MIL/uL (ref 3.87–5.11)
RDW: 13.5 % (ref 11.5–15.5)
WBC: 7.9 10*3/uL (ref 4.0–10.5)

## 2016-11-10 LAB — COMPREHENSIVE METABOLIC PANEL
ALT: 19 U/L (ref 14–54)
AST: 26 U/L (ref 15–41)
Albumin: 3.3 g/dL — ABNORMAL LOW (ref 3.5–5.0)
Alkaline Phosphatase: 95 U/L (ref 38–126)
Anion gap: 9 (ref 5–15)
BUN: 14 mg/dL (ref 6–20)
CHLORIDE: 105 mmol/L (ref 101–111)
CO2: 26 mmol/L (ref 22–32)
CREATININE: 0.84 mg/dL (ref 0.44–1.00)
Calcium: 8.6 mg/dL — ABNORMAL LOW (ref 8.9–10.3)
GFR calc non Af Amer: >60 mL/min (ref >60)
Glucose, Bld: 137 mg/dL — ABNORMAL HIGH (ref 65–99)
Potassium: 3.1 mmol/L — ABNORMAL LOW (ref 3.5–5.1)
SODIUM: 140 mmol/L (ref 135–145)
Total Bilirubin: 0.8 mg/dL (ref 0.3–1.2)
Total Protein: 6.3 g/dL — ABNORMAL LOW (ref 6.5–8.1)

## 2016-11-10 LAB — I-STAT CHEM 8, ED
BUN: 17 mg/dL (ref 6–20)
Calcium, Ion: 1.04 mmol/L — ABNORMAL LOW (ref 1.15–1.40)
Chloride: 104 mmol/L (ref 101–111)
Creatinine, Ser: 0.9 mg/dL (ref 0.44–1.00)
Glucose, Bld: 132 mg/dL — ABNORMAL HIGH (ref 65–99)
HEMATOCRIT: 36 % (ref 36.0–46.0)
HEMOGLOBIN: 12.2 g/dL (ref 12.0–15.0)
POTASSIUM: 3.1 mmol/L — AB (ref 3.5–5.1)
Sodium: 142 mmol/L (ref 135–145)
TCO2: 25 mmol/L (ref 0–100)

## 2016-11-10 LAB — CBG MONITORING, ED: GLUCOSE-CAPILLARY: 127 mg/dL — AB (ref 65–99)

## 2016-11-10 LAB — I-STAT TROPONIN, ED: Troponin i, poc: 0 ng/mL (ref 0.00–0.08)

## 2016-11-10 LAB — PROTIME-INR
INR: 1.01
PROTHROMBIN TIME: 13.3 s (ref 11.4–15.2)

## 2016-11-10 LAB — APTT: APTT: 28 s (ref 24–36)

## 2016-11-10 MED ORDER — ACETAMINOPHEN 325 MG PO TABS
650.0000 mg | ORAL_TABLET | ORAL | Status: DC | PRN
  Administered 2016-11-11 – 2016-11-16 (×5): 650 mg via ORAL
  Filled 2016-11-10 (×6): qty 2

## 2016-11-10 MED ORDER — VENLAFAXINE HCL ER 75 MG PO CP24
75.0000 mg | ORAL_CAPSULE | Freq: Every day | ORAL | Status: DC
  Filled 2016-11-10: qty 1

## 2016-11-10 MED ORDER — ENOXAPARIN SODIUM 40 MG/0.4ML ~~LOC~~ SOLN
40.0000 mg | Freq: Every day | SUBCUTANEOUS | Status: DC
  Filled 2016-11-10 (×2): qty 0.4

## 2016-11-10 MED ORDER — SODIUM CHLORIDE 0.9 % IV SOLN
1000.0000 mg | Freq: Once | INTRAVENOUS | Status: DC
  Filled 2016-11-10: qty 10

## 2016-11-10 MED ORDER — FUROSEMIDE 20 MG PO TABS
20.0000 mg | ORAL_TABLET | Freq: Every day | ORAL | Status: DC
  Filled 2016-11-10 (×2): qty 1

## 2016-11-10 MED ORDER — SIMVASTATIN 5 MG PO TABS: 10.0000 mg | ORAL_TABLET | Freq: Every day | ORAL | Status: DC

## 2016-11-10 MED ORDER — LORAZEPAM 2 MG/ML IJ SOLN
1.0000 mg | INTRAMUSCULAR | Status: DC | PRN
  Administered 2016-11-11 – 2016-11-16 (×5): 1 mg via INTRAVENOUS
  Filled 2016-11-10 (×5): qty 1

## 2016-11-10 MED ORDER — ATENOLOL 25 MG PO TABS
50.0000 mg | ORAL_TABLET | Freq: Every morning | ORAL | Status: DC
  Filled 2016-11-10: qty 1

## 2016-11-10 MED ORDER — ADULT MULTIVITAMIN W/MINERALS CH: 1.0000 | ORAL_TABLET | Freq: Every day | ORAL | Status: DC | PRN

## 2016-11-10 MED ORDER — STROKE: EARLY STAGES OF RECOVERY BOOK
Freq: Once | Status: DC
  Filled 2016-11-10: qty 1

## 2016-11-10 MED ORDER — GABAPENTIN 400 MG PO CAPS
400.0000 mg | ORAL_CAPSULE | Freq: Every day | ORAL | Status: DC
  Filled 2016-11-10 (×4): qty 1

## 2016-11-10 MED ORDER — DOXEPIN HCL 6 MG PO TABS: 6.0000 mg | ORAL_TABLET | Freq: Every day | ORAL | Status: DC

## 2016-11-10 MED ORDER — ALPRAZOLAM 0.5 MG PO TABS: 1.0000 mg | ORAL_TABLET | Freq: Four times a day (QID) | ORAL | Status: DC | PRN

## 2016-11-10 MED ORDER — POTASSIUM CHLORIDE 20 MEQ/15ML (10%) PO SOLN: 40.0000 meq | Freq: Once | ORAL | Status: DC

## 2016-11-10 MED ORDER — PANTOPRAZOLE SODIUM 40 MG PO TBEC
40.0000 mg | DELAYED_RELEASE_TABLET | Freq: Every day | ORAL | Status: DC
  Filled 2016-11-10: qty 1

## 2016-11-10 MED ORDER — HYDROXYZINE HCL 50 MG/ML IM SOLN
25.0000 mg | Freq: Four times a day (QID) | INTRAMUSCULAR | Status: DC | PRN
  Administered 2016-11-11 – 2016-11-12 (×2): 25 mg via INTRAMUSCULAR
  Filled 2016-11-10: qty 1
  Filled 2016-11-10: qty 0.5
  Filled 2016-11-10: qty 1

## 2016-11-10 MED ORDER — TIZANIDINE HCL 4 MG PO TABS: 4.0000 mg | ORAL_TABLET | Freq: Three times a day (TID) | ORAL | Status: DC | PRN

## 2016-11-10 MED ORDER — SENNOSIDES-DOCUSATE SODIUM 8.6-50 MG PO TABS
1.0000 | ORAL_TABLET | Freq: Every evening | ORAL | Status: DC | PRN
  Filled 2016-11-10: qty 1

## 2016-11-10 MED ORDER — SODIUM CHLORIDE 0.9 % IV SOLN
500.0000 mg | Freq: Two times a day (BID) | INTRAVENOUS | Status: DC
  Administered 2016-11-11 – 2016-11-13 (×7): 500 mg via INTRAVENOUS
  Filled 2016-11-10 (×10): qty 5

## 2016-11-10 MED ORDER — LEVOTHYROXINE SODIUM 75 MCG PO TABS
75.0000 ug | ORAL_TABLET | Freq: Every day | ORAL | Status: DC
  Filled 2016-11-10 (×2): qty 1

## 2016-11-10 MED ORDER — ASPIRIN 325 MG PO TABS
325.0000 mg | ORAL_TABLET | Freq: Every day | ORAL | Status: DC
  Administered 2016-11-13 – 2016-11-18 (×5): 325 mg via ORAL
  Filled 2016-11-10 (×7): qty 1

## 2016-11-10 MED ORDER — HYDRALAZINE HCL 20 MG/ML IJ SOLN
10.0000 mg | Freq: Once | INTRAMUSCULAR | Status: AC
  Administered 2016-11-10: 10 mg via INTRAVENOUS
  Filled 2016-11-10: qty 1

## 2016-11-10 MED ORDER — ASPIRIN 300 MG RE SUPP
300.0000 mg | Freq: Every day | RECTAL | Status: DC
  Administered 2016-11-12: 300 mg via RECTAL
  Filled 2016-11-10: qty 1

## 2016-11-10 MED ORDER — HYDRALAZINE HCL 20 MG/ML IJ SOLN
5.0000 mg | INTRAMUSCULAR | Status: DC | PRN
  Administered 2016-11-11 – 2016-11-15 (×7): 5 mg via INTRAVENOUS
  Filled 2016-11-10 (×7): qty 1

## 2016-11-10 MED ORDER — ACETAMINOPHEN 650 MG RE SUPP
650.0000 mg | RECTAL | Status: DC | PRN
  Administered 2016-11-12: 650 mg via RECTAL
  Filled 2016-11-10: qty 1

## 2016-11-10 MED ORDER — HYOSCYAMINE SULFATE ER 0.375 MG PO TB12
0.3750 mg | ORAL_TABLET | Freq: Two times a day (BID) | ORAL | Status: DC | PRN
  Filled 2016-11-10: qty 1

## 2016-11-10 MED ORDER — ZOLPIDEM TARTRATE 5 MG PO TABS: 5.0000 mg | ORAL_TABLET | Freq: Every day | ORAL | Status: DC

## 2016-11-10 MED ORDER — SODIUM CHLORIDE 0.9 % IV SOLN
1000.0000 mg | Freq: Two times a day (BID) | INTRAVENOUS | Status: DC
  Administered 2016-11-10: 1000 mg via INTRAVENOUS
  Filled 2016-11-10 (×2): qty 10

## 2016-11-10 NOTE — ED Notes (Signed)
Family at bedside. 

## 2016-11-10 NOTE — ED Notes (Signed)
The pts husband just told this RN that before I entered the room " she was so rigid, she was levitating off the bed" Pt husband says she has had seizure before but not like that.

## 2016-11-10 NOTE — H&P (Signed)
History and Physical    [name] [name] [id number] DOB: 1936-08-30 DOA: 11/10/2016  Referring MD/NP/PA:   PCP: Birdie Sons, MD (Inactive)   Patient coming from:  The patient is coming from home.  At baseline, pt is independent for most of ADL.   Chief Complaint: Altered mental status and seizure  HPI: [name] [name] is a 80 y.o. female with medical history significant of hypertension, hyperlipidemia, GERD, hypothyroidism, depression, PsyD, pancreatitis, stroke, dCHF, who presents with altered mental status and seizure.  Per pt's husband, pt became confused at about 3:30 PM. No slurred speech, facial droop. No unilateral weakness noted. Per EDP, pt had one episode of seizure activity in ED. She had a witnessed seizure in ED, has a minor lip laceration with minimal bleeding. Per husband, patient had a too loose stool about once yesterday, but no diarrhea today. Patient does not seem to have nausea, vomiting or abdominal pain today. No chest pain, cough or shortness of breath. Patient does not have fever or chills. Blood pressure in the ED was 215/92, which improved to 176/76 after giving 1 dose of hydralazine 10 mg IV.  ED Course: pt was found to have WBC 7.9, negative troponin, INR 1.01, PTT 28, potassium 3.1, normal creatinine, pending urinalysis, temperature normal, O2 saturation 97% on room air, negative CT head for acute intracranial abnormalities. Patient is placed on telemetry bed for observation. Neurology, Dr. Nicole Kindred was consulted by EDP. Patient was loaded with 1 g of Keppra in ED.  Review of Systems: Could not be viewed adequately due to altered mental status.   Allergy:  Allergies  Allergen Reactions  . Tetanus Toxoids Swelling  . Yellow Dyes (Non-Tartrazine) Itching and Other (See Comments)    Makes patient nervous     Past Medical History:  Diagnosis Date  . Anemia    hx of years ago   . Anxiety   . ARF (acute renal failure) ([place]) 08/23/2014  . Arthritis    psoriatic arthritis  . Back pain   . Blood transfusion    at age of 4  . Chronic diastolic CHF (congestive heart failure) ([place])   . Depression   . GERD (gastroesophageal reflux disease)   . H/O hiatal hernia   . Hyperlipidemia   . Hypertension   . Hypothyroidism   . IBS (irritable bowel syndrome)   . Pancreatitis   . PONV (postoperative nausea and vomiting)   . Thyroid disease     Past Surgical History:  Procedure Laterality Date  . ABDOMINAL HYSTERECTOMY     partial  . BACK SURGERY  2010   thoractic-screws and rods  . BILE DUCT STENT PLACEMENT  08/26/2014  . BREAST REDUCTION SURGERY    . CHOLECYSTECTOMY  1988   lap  . COLONOSCOPY    . COLONOSCOPY Left 09/08/2014   Procedure: COLONOSCOPY;  Surgeon: Arta Silence, MD;  Location: [facility] ENDOSCOPY;  Service: Endoscopy;  Laterality: Left;  . ERCP    . ERCP N/A 08/26/2014   Procedure: ENDOSCOPIC RETROGRADE CHOLANGIOPANCREATOGRAPHY (ERCP);  Surgeon: Jeryl Columbia, MD;  Location: [facility] ENDOSCOPY;  Service: Endoscopy;  Laterality: N/A;  . ERCP N/A 09/11/2014   Procedure: ENDOSCOPIC RETROGRADE CHOLANGIOPANCREATOGRAPHY (ERCP);  Surgeon: Arta Silence, MD;  Location: [facility] ENDOSCOPY;  Service: Endoscopy;  Laterality: N/A;  . EUS N/A 09/11/2014   Procedure: ESOPHAGEAL ENDOSCOPIC ULTRASOUND (EUS) RADIAL;  Surgeon: Arta Silence, MD;  Location: [facility] ENDOSCOPY;  Service: Endoscopy;  Laterality: N/A;  . JOINT REPLACEMENT  2005   left shoulder  replacement   . ORIF CLAVICULAR FRACTURE Left 07/18/2014   Procedure: OPEN REDUCTION INTERNAL FIXATION (ORIF) LEFT CLAVICULAR FRACTURE;  Surgeon: Ninetta Lights, MD;  Location: [place];  Service: Orthopedics;  Laterality: Left;  . SPYGLASS CHOLANGIOSCOPY N/A 09/11/2014   Procedure: SPYGLASS CHOLANGIOSCOPY;  Surgeon: Arta Silence, MD;  Location: [facility] ENDOSCOPY;  Service: Endoscopy;  Laterality: N/A;  . TONSILLECTOMY  as child  . TOTAL KNEE ARTHROPLASTY Left 03/10/2016   Procedure: LEFT TOTAL KNEE  ARTHROPLASTY;  Surgeon: Ninetta Lights, MD;  Location: [place];  Service: Orthopedics;  Laterality: Left;  [name] VENTRAL HERNIA REPAIR  06/08/2012   Procedure: LAPAROSCOPIC VENTRAL HERNIA;  Surgeon: Adin Hector, MD;  Location: [facility] ORS;  Service: General;  Laterality: Right;    Social History:  reports that she has never smoked. She has never used smokeless tobacco. She reports that she drinks about 0.6 oz of alcohol per week . She reports that she does not use drugs.  Family History:  Family History  Problem Relation Age of Onset  . Heart disease Mother   . Cancer Father     lung     Prior to Admission medications   Medication Sig Start Date End Date Taking? Authorizing Provider  ALPRAZolam Duanne Moron) 1 MG tablet Take 1 mg by mouth 4 (four) times daily as needed for anxiety.     Historical Provider, MD  atenolol (TENORMIN) 50 MG tablet Take 50 mg by mouth every morning.    Historical Provider, MD  esomeprazole (NEXIUM) 40 MG capsule Take 40 mg by mouth daily. 08/31/16   Historical Provider, MD  furosemide (LASIX) 20 MG tablet Take 20 mg by mouth daily with breakfast.     Historical Provider, MD  gabapentin (NEURONTIN) 400 MG capsule Take 400 mg by mouth 6 (six) times daily. As needed for nerve pain    Historical Provider, MD  hyoscyamine (LEVBID) 0.375 MG 12 hr tablet Take 0.375 mg by mouth every 12 (twelve) hours as needed for cramping.     Historical Provider, MD  levothyroxine (SYNTHROID, LEVOTHROID) 75 MCG tablet Take 75 mcg by mouth daily before breakfast.  08/28/16   Historical Provider, MD  Multiple Vitamin (MULTIVITAMIN WITH MINERALS) TABS tablet Take 1 tablet by mouth daily as needed (for supplementation).     Historical Provider, MD  potassium chloride SA (K-DUR,KLOR-CON) 20 MEQ tablet Take 40 mEq by mouth daily.     Historical Provider, MD  SILENOR 6 MG TABS Take 6 mg by mouth at bedtime. 09/02/16   Historical Provider, MD  simvastatin (ZOCOR) 10 MG tablet Take 10 mg by mouth at bedtime.  08/09/16   Historical Provider, MD  tiZANidine (ZANAFLEX) 4 MG tablet Take 4 mg by mouth 3 (three) times daily as needed for muscle spasms.  07/09/16   Historical Provider, MD  venlafaxine XR (EFFEXOR-XR) 75 MG 24 hr capsule Take 75 mg by mouth daily with breakfast.     Historical Provider, MD  zolpidem (AMBIEN) 5 MG tablet Take 5 mg by mouth at bedtime. 08/09/16   Historical Provider, MD    Physical Exam: Vitals:   11/11/16 0215 11/11/16 0230 11/11/16 0330 11/11/16 0345  BP: 172/84 153/77 166/69 155/73  Pulse: 84 85 82 84  Resp: 23 18 20 19   Temp:      TempSrc:      SpO2: 97% 96% 96% 95%  Weight:      Height:       General: Not in acute distress  HEENT:       Eyes: PERRL, EOMI, no scleral icterus.       ENT: No discharge from the ears and nose, no pharynx injection, no tonsillar enlargement.        Neck: No JVD, no bruit, no mass felt. Heme: No neck lymph node enlargement. Cardiac: S1/S2, RRR, No murmurs, No gallops or rubs. Respiratory:  No rales, wheezing, rhonchi or rubs. GI: Soft, nondistended, nontender, no rebound pain, no organomegaly, BS present. GU: No hematuria Ext: has trace leg edema bilaterally. 2+DP/PT pulse bilaterally. Musculoskeletal: No joint deformities, No joint redness or warmth, no limitation of ROM in spin. Skin: No rashes.  Neuro: confused, oriented to place and person, but not to time, cranial nerves II-XII grossly intact. Muscle strength 5/5 in all extremities, sensation to light touch intact. Brachial reflex 2+ bilaterally. Negative Babinski's sign.   Labs on Admission: I have personally reviewed following labs and imaging studies  CBC:  Recent Labs Lab 11/10/16 2152 11/10/16 2158  WBC 7.9  --   NEUTROABS 5.4  --   HGB 12.6 12.2  HCT 37.7 36.0  MCV 89.1  --   PLT 182  --    Basic Metabolic Panel:  Recent Labs Lab 11/10/16 2152 11/10/16 2158 11/11/16 0200  NA 140 142  --   K 3.1* 3.1*  --   CL 105 104  --   CO2 26  --   --   GLUCOSE  137* 132*  --   BUN 14 17  --   CREATININE 0.84 0.90  --   CALCIUM 8.6*  --   --   MG  --   --  1.6*   GFR: Estimated Creatinine Clearance: 45.4 mL/min (by C-G formula based on SCr of 0.9 mg/dL). Liver Function Tests:  Recent Labs Lab 11/10/16 2152  AST 26  ALT 19  ALKPHOS 95  BILITOT 0.8  PROT 6.3*  ALBUMIN 3.3*   No results for input(s): LIPASE, AMYLASE in the last 168 hours. No results for input(s): AMMONIA in the last 168 hours. Coagulation Profile:  Recent Labs Lab 11/10/16 2152 11/10/16 2348  INR 1.01 1.01   Cardiac Enzymes: No results for input(s): CKTOTAL, CKMB, CKMBINDEX, TROPONINI in the last 168 hours. BNP (last 3 results) No results for input(s): PROBNP in the last 8760 hours. HbA1C: No results for input(s): HGBA1C in the last 72 hours. CBG:  Recent Labs Lab 11/10/16 2151  GLUCAP 127*   Lipid Profile:  Recent Labs  11/11/16 0202  CHOL 204*  HDL 61  LDLCALC 120*  TRIG 117  CHOLHDL 3.3   Thyroid Function Tests:  Recent Labs  11/11/16 0201  TSH 1.125   Anemia Panel: No results for input(s): VITAMINB12, FOLATE, FERRITIN, TIBC, IRON, RETICCTPCT in the last 72 hours. Urine analysis:    Component Value Date/Time   COLORURINE YELLOW 11/11/2016 0418   APPEARANCEUR CLEAR 11/11/2016 0418   LABSPEC 1.010 11/11/2016 0418   PHURINE 6.0 11/11/2016 0418   GLUCOSEU NEGATIVE 11/11/2016 0418   HGBUR TRACE (A) 11/11/2016 0418   BILIRUBINUR NEGATIVE 11/11/2016 0418   KETONESUR NEGATIVE 11/11/2016 0418   PROTEINUR 100 (A) 11/11/2016 0418   UROBILINOGEN 0.2 09/04/2014 1340   NITRITE NEGATIVE 11/11/2016 0418   LEUKOCYTESUR NEGATIVE 11/11/2016 0418   Sepsis Labs: @LABRCNTIP (procalcitonin:4,lacticidven:4) )No results found for this or any previous visit (from the past 240 hour(s)).   Radiological Exams on Admission: Ct Head Code Stroke W/o Cm  Addendum Date: 11/10/2016   ADDENDUM REPORT:  11/10/2016 22:21 ADDENDUM: These results were called by  telephone at the time of interpretation on 11/10/2016 at 10:20 pm to Dr. Carmin Muskrat , who verbally acknowledged these results. Electronically Signed   By: Kristine Garbe M.D.   On: 11/10/2016 22:21   Result Date: 11/10/2016 CLINICAL DATA:  Code stroke.  Acute confusion and weakness. EXAM: CT HEAD WITHOUT CONTRAST TECHNIQUE: Contiguous axial images were obtained from the base of the skull through the vertex without intravenous contrast. COMPARISON:  09/06/2016 CT of the head. FINDINGS: Brain: No evidence of acute infarction, hemorrhage, hydrocephalus, extra-axial collection or mass lesion/mass effect. Few stable nonspecific foci of white matter hypoattenuation are compatible with mild chronic microvascular ischemic changes. Mild brain parenchymal volume loss. Stable lucency in pons compatible with chronic lacunar infarct. Vascular: No hyperdense vessel. Calcific atherosclerosis of cavernous internal carotid arteries. Skull: Normal. Negative for fracture or focal lesion. Sinuses/Orbits: No acute finding. Other: None. ASPECTS [facility] Stroke Program Early CT Score) - Ganglionic level infarction (caudate, lentiform nuclei, internal capsule, insula, M1-M3 cortex): 7 - Supraganglionic infarction (M4-M6 cortex): 3 Total score (0-10 with 10 being normal): 10 IMPRESSION: 1. No acute intracranial abnormality. 2. Mild chronic microvascular ischemic changes and brain parenchymal volume loss. 3. ASPECTS is 10 Electronically Signed: By: Kristine Garbe M.D. On: 11/10/2016 22:13     EKG: Independently reviewed.  Sinus rhythm, QTC 523, anteroseptal infarction pattern.  Assessment/Plan Principal Problem:   Acute encephalopathy Active Problems:   GERD (gastroesophageal reflux disease)   Anxiety   Hypokalemia   Chronic diastolic CHF (congestive heart failure) (HCC)   Hypothyroidism   Hypertensive urgency   Altered mental state   Seizure ([place])   Acute encephalopathy: Etiology is not clear.  CT head is negative for acute intracranial abnormalities. Neurology, Dr. Nicole Kindred was consulted-->possible acute hypertensive encephalopathy. Pt had seizure in Ed. Dr. Nicole Kindred recommended MRI and EEG  -will place on telemetry bed for observation -Highly appreciate Dr. Nicole Kindred consultation, we follow-up recommendations. -MRI for brain -EEG -Seizure precaution -Keppra: Patient was loaded with 1 g of Keppra, we'll continue 500 g twice a day -When necessary Ativan for seizure -Frequent neuro check -will start ASA empirically before ruling out stroke  Seizure: -see above  Hypertensive urgency: blood pressure 251/95, responded to IV hydralazine, improved to 176/76 -Continue atenolol, Lasix -IV hydralazine.  Chronic systolic congestive heart failure: 2-D echo 09/05/14 showed EF 60-65 percent with grade 1 diastolic dysfunction. Patient is taking low-dose Lasix, 20 mg daily at home. No JVD, has only trace amount of leg edema. CHF is compensated on admission. -Continue atenolol and aspirin -Continue home dose Lasix -Check BNP  HLD: Last LDL was not on record -Continue home medications: Zocor -Check FLP  GERD: -Protonix  Depression and anxiety: -Continue home medications: . Xanax, Effexor  Hypokalemia and hypomagnesemia: K= 3.1 on admission. Mg=1.6 - Repleted K - give 2 g of magnesium sulfate of IV  Hypothyroidism: Last TSH was not on record  -Continue home Synthroid -Check TSH    DVT ppx: SQ Lovenox Code Status: Full code Family Communication:  Yes, patient's husband at bed side Disposition Plan:  Anticipate discharge back to previous home environment Consults called:  Neurology, Dr. Nicole Kindred Admission status: Obs / tele    Date of Service 11/11/2016    Ivor Costa [facility] Pager [phone]  If 7PM-7AM, please contact night-coverage [email or web address] Password [email or web address] 11/11/2016, 5:56 AM

## 2016-11-10 NOTE — ED Triage Notes (Signed)
Per EMS: pt has a Hx "small strokes" and was last scene well at 2000 today; pt is currently confused.  Pt is A&Ox2 to just person and place. Her BP on scene was 232/120, CBG of 158 and SPO2 92% on RA but was put on 2 L O2. Code stroke was call in route.

## 2016-11-10 NOTE — Consult Note (Signed)
Admission H&P    Chief Complaint: Altered mental status with hypertensive urgency.  HPI: [name] [name] is an 80 y.o. female with a history of hypertension, hyperlipidemia, hypothyroidism, depression and anxiety, brought to the emergency room and code stroke status following acute onset of mental status with confusion. Patient was noted to have markedly elevated pressure by EMS. No facial droop was noted. There was no dysarthria. No focal weakness was noted. CT scan of the head showed no acute intracranial abnormality. She was disoriented to time and place, but had no focal findings on exam in the ED. Code stroke was canceled. Blood sugar was 132 and potassium was 3.1. Blood pressure in the ED was 215/92. She was given hydralazine 10 mg IV. She was afebrile.  Past Medical History:  Diagnosis Date  . Anemia    hx of years ago   . Anxiety   . ARF (acute renal failure) ([place]) 08/23/2014  . Arthritis    psoriatic arthritis  . Back pain   . Blood transfusion    at age of 4  . Depression   . GERD (gastroesophageal reflux disease)   . H/O hiatal hernia   . Hyperlipidemia   . Hypertension   . Hypothyroidism   . IBS (irritable bowel syndrome)   . Pancreatitis   . PONV (postoperative nausea and vomiting)   . Thyroid disease     Past Surgical History:  Procedure Laterality Date  . ABDOMINAL HYSTERECTOMY     partial  . BACK SURGERY  2010   thoractic-screws and rods  . BILE DUCT STENT PLACEMENT  08/26/2014  . BREAST REDUCTION SURGERY    . CHOLECYSTECTOMY  1988   lap  . COLONOSCOPY    . COLONOSCOPY Left 09/08/2014   Procedure: COLONOSCOPY;  Surgeon: Arta Silence, MD;  Location: [facility] ENDOSCOPY;  Service: Endoscopy;  Laterality: Left;  . ERCP    . ERCP N/A 08/26/2014   Procedure: ENDOSCOPIC RETROGRADE CHOLANGIOPANCREATOGRAPHY (ERCP);  Surgeon: Jeryl Columbia, MD;  Location: [facility] ENDOSCOPY;  Service: Endoscopy;  Laterality: N/A;  . ERCP N/A 09/11/2014   Procedure: ENDOSCOPIC RETROGRADE  CHOLANGIOPANCREATOGRAPHY (ERCP);  Surgeon: Arta Silence, MD;  Location: [facility] ENDOSCOPY;  Service: Endoscopy;  Laterality: N/A;  . EUS N/A 09/11/2014   Procedure: ESOPHAGEAL ENDOSCOPIC ULTRASOUND (EUS) RADIAL;  Surgeon: Arta Silence, MD;  Location: [facility] ENDOSCOPY;  Service: Endoscopy;  Laterality: N/A;  . JOINT REPLACEMENT  2005   left shoulder replacement   . ORIF CLAVICULAR FRACTURE Left 07/18/2014   Procedure: OPEN REDUCTION INTERNAL FIXATION (ORIF) LEFT CLAVICULAR FRACTURE;  Surgeon: Ninetta Lights, MD;  Location: [place];  Service: Orthopedics;  Laterality: Left;  . SPYGLASS CHOLANGIOSCOPY N/A 09/11/2014   Procedure: SPYGLASS CHOLANGIOSCOPY;  Surgeon: Arta Silence, MD;  Location: [facility] ENDOSCOPY;  Service: Endoscopy;  Laterality: N/A;  . TONSILLECTOMY  as child  . TOTAL KNEE ARTHROPLASTY Left 03/10/2016   Procedure: LEFT TOTAL KNEE ARTHROPLASTY;  Surgeon: Ninetta Lights, MD;  Location: [place];  Service: Orthopedics;  Laterality: Left;  [name] VENTRAL HERNIA REPAIR  06/08/2012   Procedure: LAPAROSCOPIC VENTRAL HERNIA;  Surgeon: Adin Hector, MD;  Location: [facility];  Service: General;  Laterality: Right;    Family History  Problem Relation Age of Onset  . Heart disease Mother   . Cancer Father     lung   Social History:  reports that she has never smoked. She has never used smokeless tobacco. She reports that she drinks about 0.6 oz of alcohol  per week . She reports that she does not use drugs.  Allergies:  Allergies  Allergen Reactions  . Tetanus Toxoids Swelling  . Yellow Dyes (Non-Tartrazine) Itching and Other (See Comments)    Makes patient nervous     Medications: Preadmission medications were reviewed by me.  ROS: Unavailable due to patient's confusional state.  Physical Examination: Blood pressure (!) 215/92, pulse 76, temperature 98.7 F (37.1 C), temperature source Oral, resp. rate 22, height 5\' 1"  (1.549 m), weight 67.7 kg (149 lb 4 oz), SpO2 97  %.  HEENT-  Normocephalic, no lesions, without obvious abnormality.  Normal external eye and conjunctiva.  Normal TM's bilaterally.  Normal auditory canals and external ears. Normal external nose, mucus membranes and septum.  Normal pharynx. Neck supple with no masses, nodes, nodules or enlargement. Cardiovascular - regular rate and rhythm, S1, S2 normal, no murmur, click, rub or gallop Lungs - chest clear, no wheezing, rales, normal symmetric air entry Abdomen - soft, non-tender; bowel sounds normal; no masses,  no organomegaly Extremities - no joint deformities, effusion, or inflammation  Neurologic Examination: Mental Status: Alert, disoriented to time and place, no acute distress.  Speech fluent without evidence of aphasia. Able to follow commands without difficulty. Cranial Nerves: II-Visual fields were normal. III/IV/VI-Pupils were equal and reacted normally to light. Extraocular movements were full and conjugate.    V/VII-no facial numbness and no facial weakness. VIII-normal. X-normal speech and symmetrical palatal movement. Motor: 5/5 bilaterally with normal tone and bulk Sensory: Normal throughout. Deep Tendon Reflexes: 1+ and symmetric. Plantars: Flexor bilaterally Cerebellar: Normal finger-to-nose testing. Carotid auscultation: Normal  Results for orders placed or performed during the hospital encounter of 11/10/16 (from the past 48 hour(s))  CBG monitoring, ED     Status: Abnormal   Collection Time: 11/10/16  9:51 PM  Result Value Ref Range   Glucose-Capillary 127 (H) 65 - 99 mg/dL  Protime-INR     Status: None   Collection Time: 11/10/16  9:52 PM  Result Value Ref Range   Prothrombin Time 13.3 11.4 - 15.2 seconds   INR 1.01   APTT     Status: None   Collection Time: 11/10/16  9:52 PM  Result Value Ref Range   aPTT 28 24 - 36 seconds  CBC     Status: None   Collection Time: 11/10/16  9:52 PM  Result Value Ref Range   WBC 7.9 4.0 - 10.5 K/uL   RBC 4.23 3.87 -  5.11 MIL/uL   Hemoglobin 12.6 12.0 - 15.0 g/dL   HCT 37.7 36.0 - 46.0 %   MCV 89.1 78.0 - 100.0 fL   MCH 29.8 26.0 - 34.0 pg   MCHC 33.4 30.0 - 36.0 g/dL   RDW 13.5 11.5 - 15.5 %   Platelets 182 150 - 400 K/uL  Differential     Status: None   Collection Time: 11/10/16  9:52 PM  Result Value Ref Range   Neutrophils Relative % 68 %   Neutro Abs 5.4 1.7 - 7.7 K/uL   Lymphocytes Relative 20 %   Lymphs Abs 1.6 0.7 - 4.0 K/uL   Monocytes Relative 9 %   Monocytes Absolute 0.7 0.1 - 1.0 K/uL   Eosinophils Relative 3 %   Eosinophils Absolute 0.2 0.0 - 0.7 K/uL   Basophils Relative 0 %   Basophils Absolute 0.0 0.0 - 0.1 K/uL  I-stat troponin, ED     Status: None   Collection Time: 11/10/16  9:56 PM  Result Value Ref Range   Troponin i, poc 0.00 0.00 - 0.08 ng/mL   Comment 3            Comment: Due to the release kinetics of cTnI, a negative result within the first hours of the onset of symptoms does not rule out myocardial infarction with certainty. If myocardial infarction is still suspected, repeat the test at appropriate intervals.   I-Stat Chem 8, ED     Status: Abnormal   Collection Time: 11/10/16  9:58 PM  Result Value Ref Range   Sodium 142 135 - 145 mmol/L   Potassium 3.1 (L) 3.5 - 5.1 mmol/L   Chloride 104 101 - 111 mmol/L   BUN 17 6 - 20 mg/dL   Creatinine, Ser 0.90 0.44 - 1.00 mg/dL   Glucose, Bld 132 (H) 65 - 99 mg/dL   Calcium, Ion 1.04 (L) 1.15 - 1.40 mmol/L   TCO2 25 0 - 100 mmol/L   Hemoglobin 12.2 12.0 - 15.0 g/dL   HCT 36.0 36.0 - 46.0 %   Ct Head Code Stroke W/o Cm  Result Date: 11/10/2016 CLINICAL DATA:  Code stroke.  Acute confusion and weakness. EXAM: CT HEAD WITHOUT CONTRAST TECHNIQUE: Contiguous axial images were obtained from the base of the skull through the vertex without intravenous contrast. COMPARISON:  09/06/2016 CT of the head. FINDINGS: Brain: No evidence of acute infarction, hemorrhage, hydrocephalus, extra-axial collection or mass lesion/mass  effect. Few stable nonspecific foci of white matter hypoattenuation are compatible with mild chronic microvascular ischemic changes. Mild brain parenchymal volume loss. Stable lucency in pons compatible with chronic lacunar infarct. Vascular: No hyperdense vessel. Calcific atherosclerosis of cavernous internal carotid arteries. Skull: Normal. Negative for fracture or focal lesion. Sinuses/Orbits: No acute finding. Other: None. ASPECTS [facility] Stroke Program Early CT Score) - Ganglionic level infarction (caudate, lentiform nuclei, internal capsule, insula, M1-M3 cortex): 7 - Supraganglionic infarction (M4-M6 cortex): 3 Total score (0-10 with 10 being normal): 10 IMPRESSION: 1. No acute intracranial abnormality. 2. Mild chronic microvascular ischemic changes and brain parenchymal volume loss. 3. ASPECTS is 10 Electronically Signed   By: Kristine Garbe M.D.   On: 11/10/2016 22:13    Assessment/Plan 80 year old lady presenting with markedly elevated blood pressure and altered mental status, most likely manifestations of acute hypertensive encephalopathy. She has no focal deficits. Acute stroke is unlikely. She does not appear to have an acute febrile illness. Urinalysis is pending. Infectious process cannot be ruled out at this point.  Recommendations: 1. MRI of the brain to rule out acute stroke as well as to rule out signs of hypertensive encephalopathy, such as PRES 2. EEG, routine about study to assess severity of encephalopathy as well as rule out possible focal seizure activity 3. Management of hypertensive emergency per ED physician and admitting hospitalist  We will continue to follow this patient with you.  C.R. Nicole Kindred, MD [facility] [phone]  11/10/2016, 10:19 PM

## 2016-11-10 NOTE — ED Notes (Signed)
Pt is denying any pain, numbness, or tingling. Pt has clear speech.

## 2016-11-10 NOTE — ED Provider Notes (Signed)
[place] DEPT Provider Note   CSN: [id number] Arrival date & time: 11/10/16  2148     History   Chief Complaint Chief Complaint  Patient presents with  . Altered Mental Status    HPI [name] [name] is a 80 y.o. female.  HPI  Patient presents as a code stroke. Last seen normal time was about 2 hours prior to ED arrival. History is provided by the patient's husband after the initial evaluation, and after initial fluid resuscitation. Patient has a history of prior stroke, and today, had relatively sudden onset of confusion, disorientation, generalized weakness. On arrival the patient was incapable of providing history of present illness, level V caveat No report of fall, trauma.   Past Medical History:  Diagnosis Date  . Anemia    hx of years ago   . Anxiety   . ARF (acute renal failure) ([place]) 08/23/2014  . Arthritis    psoriatic arthritis  . Back pain   . Blood transfusion    at age of 4  . Chronic diastolic CHF (congestive heart failure) ([place])   . Depression   . GERD (gastroesophageal reflux disease)   . H/O hiatal hernia   . Hyperlipidemia   . Hypertension   . Hypothyroidism   . IBS (irritable bowel syndrome)   . Pancreatitis   . PONV (postoperative nausea and vomiting)   . Thyroid disease     Patient Active Problem List   Diagnosis Date Noted  . Acute encephalopathy 11/10/2016  . Hypothyroidism 11/10/2016  . Hypertensive urgency 11/10/2016  . Altered mental state 11/10/2016  . Seizure ([place]) 11/10/2016  . Chronic diastolic CHF (congestive heart failure) ([place])   . Left carotid stenosis 10/29/2016  . S/P total knee replacement 03/10/2016  . 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 Ileus ([place]) 09/05/2014  . Leukocytosis, unspecified 09/05/2014  . Nausea and vomiting 09/04/2014  . Pulmonary edema 09/04/2014  . Hypokalemia 08/28/2014  . Enteritis due to Clostridium difficile 08/27/2014  . 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  Protein-calorie malnutrition, severe ([place]) 08/24/2014  . Obstructive jaundice 08/23/2014  . Acute cholangitis 08/23/2014  . ARF (acute renal failure) ([place]) 08/23/2014  . Coagulopathy ([place]) 08/23/2014  . Back pain   . Arthritis   . Hypertension   . GERD (gastroesophageal reflux disease)   . Anxiety   . IBS (irritable bowel syndrome)   . Hernia of Right posterior flank 04/18/2012    Past Surgical History:  Procedure Laterality Date  . ABDOMINAL HYSTERECTOMY     partial  . BACK SURGERY  2010   thoractic-screws and rods  . BILE DUCT STENT PLACEMENT  08/26/2014  . BREAST REDUCTION SURGERY    . CHOLECYSTECTOMY  1988   lap  . COLONOSCOPY    . COLONOSCOPY Left 09/08/2014   Procedure: COLONOSCOPY;  Surgeon: Arta Silence, MD;  Location: [facility] ENDOSCOPY;  Service: Endoscopy;  Laterality: Left;  . ERCP    . ERCP N/A 08/26/2014   Procedure: ENDOSCOPIC RETROGRADE CHOLANGIOPANCREATOGRAPHY (ERCP);  Surgeon: Jeryl Columbia, MD;  Location[facility] ENDOSCOPY;  Service: Endoscopy;  Laterality: N/A;  . ERCP N/A 09/11/2014   Procedure: ENDOSCOPIC RETROGRADE CHOLANGIOPANCREATOGRAPHY (ERCP);  Surgeon: Arta Silence, MD;  Location: [facility] ENDOSCOPY;  Service: Endoscopy;  Laterality: N/A;  . EUS N/A 09/11/2014   Procedure: ESOPHAGEAL ENDOSCOPIC ULTRASOUND (EUS) RADIAL;  Surgeon: Arta Silence, MD;  Location: [facility] ENDOSCOPY;  Service: Endoscopy;  Laterality: N/A;  . JOINT REPLACEMENT  2005   left shoulder replacement   .  ORIF CLAVICULAR FRACTURE Left 07/18/2014   Procedure: OPEN REDUCTION INTERNAL FIXATION (ORIF) LEFT CLAVICULAR FRACTURE;  Surgeon: Ninetta Lights, MD;  Location: [place];  Service: Orthopedics;  Laterality: Left;  . SPYGLASS CHOLANGIOSCOPY N/A 09/11/2014   Procedure: SPYGLASS CHOLANGIOSCOPY;  Surgeon: Arta Silence, MD;  Location: [facility] ENDOSCOPY;  Service: Endoscopy;  Laterality: N/A;  . TONSILLECTOMY  as child  . TOTAL KNEE ARTHROPLASTY Left 03/10/2016   Procedure: LEFT TOTAL KNEE  ARTHROPLASTY;  Surgeon: Ninetta Lights, MD;  Location: [place];  Service: Orthopedics;  Laterality: Left;  [name] VENTRAL HERNIA REPAIR  06/08/2012   Procedure: LAPAROSCOPIC VENTRAL HERNIA;  Surgeon: Adin Hector, MD;  Location: [facility] ORS;  Service: General;  Laterality: Right;    OB History    No data available       Home Medications    Prior to Admission medications   Medication Sig Start Date End Date Taking? Authorizing Provider  ALPRAZolam Duanne Moron) 1 MG tablet Take 1 mg by mouth 4 (four) times daily as needed for anxiety.    Yes Historical Provider, MD  atenolol (TENORMIN) 50 MG tablet Take 50 mg by mouth every morning.   Yes Historical Provider, MD  esomeprazole (NEXIUM) 40 MG capsule Take 40 mg by mouth daily. 08/31/16  Yes Historical Provider, MD  furosemide (LASIX) 20 MG tablet Take 20 mg by mouth daily with breakfast.    Yes Historical Provider, MD  gabapentin (NEURONTIN) 400 MG capsule Take 400 mg by mouth 6 (six) times daily. As needed for nerve pain   Yes Historical Provider, MD  hyoscyamine (LEVBID) 0.375 MG 12 hr tablet Take 0.375 mg by mouth every 12 (twelve) hours as needed for cramping.    Yes Historical Provider, MD  levothyroxine (SYNTHROID, LEVOTHROID) 75 MCG tablet Take 75 mcg by mouth daily before breakfast.  08/28/16  Yes Historical Provider, MD  Multiple Vitamin (MULTIVITAMIN WITH MINERALS) TABS tablet Take 1 tablet by mouth daily as needed (for supplementation).    Yes Historical Provider, MD  potassium chloride SA (K-DUR,KLOR-CON) 20 MEQ tablet Take 40 mEq by mouth daily.    Yes Historical Provider, MD  SILENOR 6 MG TABS Take 6 mg by mouth at bedtime. 09/02/16  Yes Historical Provider, MD  simvastatin (ZOCOR) 10 MG tablet Take 10 mg by mouth at bedtime. 08/09/16  Yes Historical Provider, MD  tiZANidine (ZANAFLEX) 4 MG tablet Take 4 mg by mouth 3 (three) times daily as needed for muscle spasms.  07/09/16  Yes Historical Provider, MD  venlafaxine XR (EFFEXOR-XR) 75 MG 24 hr  capsule Take 75 mg by mouth daily with breakfast.    Yes Historical Provider, MD  zolpidem (AMBIEN) 5 MG tablet Take 5 mg by mouth at bedtime. 08/09/16  Yes Historical Provider, MD    Family History Family History  Problem Relation Age of Onset  . Heart disease Mother   . Cancer Father     lung    Social History Social History  Substance Use Topics  . Smoking status: Never Smoker  . Smokeless tobacco: Never Used  . Alcohol use 0.6 oz/week    1 Glasses of wine per week     Comment: rare     Allergies   Tetanus toxoids and Yellow dyes (non-tartrazine)   Review of Systems Review of Systems  Unable to perform ROS: Mental status change     Physical Exam Updated Vital Signs BP 169/66   Pulse 81   Temp 98.7 F (37.1 C) (  Oral)   Resp 23   Ht 5\' 1"  (1.549 m)   Wt 149 lb 4 oz (67.7 kg)   SpO2 95%   BMI 28.20 kg/m   Physical Exam  Constitutional: No distress.  Minimally interactive elderly frail-appearing female inconsistently follows commands, does deny pain speech is weak, slow, clear  HENT:  Head: Normocephalic and atraumatic.  Eyes: Conjunctivae and EOM are normal.  Cardiovascular: Normal rate and regular rhythm.   Pulmonary/Chest: Effort normal and breath sounds normal. No stridor. No respiratory distress.  Abdominal: She exhibits no distension.  Musculoskeletal: She exhibits no edema.  Neurological: She is disoriented. She displays no tremor. No cranial nerve deficit. She exhibits abnormal muscle tone.  Skin: Skin is warm and dry.  Psychiatric: Cognition and memory are impaired.  Nursing note and vitals reviewed.    ED Treatments / Results  Labs (all labs ordered are listed, but only abnormal results are displayed) Labs Reviewed  COMPREHENSIVE METABOLIC PANEL - Abnormal; Notable for the following:       Result Value   Potassium 3.1 (*)    Glucose, Bld 137 (*)    Calcium 8.6 (*)    Total Protein 6.3 (*)    Albumin 3.3 (*)    All other components  within normal limits  CBG MONITORING, ED - Abnormal; Notable for the following:    Glucose-Capillary 127 (*)    All other components within normal limits  I-STAT CHEM 8, ED - Abnormal; Notable for the following:    Potassium 3.1 (*)    Glucose, Bld 132 (*)    Calcium, Ion 1.04 (*)    All other components within normal limits  PROTIME-INR  APTT  CBC  DIFFERENTIAL  MAGNESIUM  BRAIN NATRIURETIC PEPTIDE  TSH  HEMOGLOBIN A1C  LIPID PANEL  PROTIME-INR  I-STAT TROPOININ, ED    EKG  EKG Interpretation  Date/Time:  Wednesday November 10 2016 22:12:44 EST Ventricular Rate:  75 PR Interval:    QRS Duration: 90 QT Interval:  468 QTC Calculation: 523 R Axis:   -22 Text Interpretation:  Sinus rhythm Prolonged PR interval Left axis deviation Artifact T wave abnormality Abnormal ekg Confirmed by Carmin Muskrat  MD [phone]) on 11/11/2016 12:07:09 AM       Radiology Ct Head Code Stroke W/o Cm  Addendum Date: 11/10/2016   ADDENDUM REPORT: 11/10/2016 22:21 ADDENDUM: These results were called by telephone at the time of interpretation on 11/10/2016 at 10:20 pm to Dr. Carmin Muskrat , who verbally acknowledged these results. Electronically Signed   By: Kristine Garbe M.D.   On: 11/10/2016 22:21   Result Date: 11/10/2016 CLINICAL DATA:  Code stroke.  Acute confusion and weakness. EXAM: CT HEAD WITHOUT CONTRAST TECHNIQUE: Contiguous axial images were obtained from the base of the skull through the vertex without intravenous contrast. COMPARISON:  09/06/2016 CT of the head. FINDINGS: Brain: No evidence of acute infarction, hemorrhage, hydrocephalus, extra-axial collection or mass lesion/mass effect. Few stable nonspecific foci of white matter hypoattenuation are compatible with mild chronic microvascular ischemic changes. Mild brain parenchymal volume loss. Stable lucency in pons compatible with chronic lacunar infarct. Vascular: No hyperdense vessel. Calcific atherosclerosis of  cavernous internal carotid arteries. Skull: Normal. Negative for fracture or focal lesion. Sinuses/Orbits: No acute finding. Other: None. ASPECTS [facility] Stroke Program Early CT Score) - Ganglionic level infarction (caudate, lentiform nuclei, internal capsule, insula, M1-M3 cortex): 7 - Supraganglionic infarction (M4-M6 cortex): 3 Total score (0-10 with 10 being normal): 10 IMPRESSION: 1. No acute  intracranial abnormality. 2. Mild chronic microvascular ischemic changes and brain parenchymal volume loss. 3. ASPECTS is 10 Electronically Signed: By: Kristine Garbe M.D. On: 11/10/2016 22:13    Procedures Procedures (including critical care time)  Medications Ordered in ED Medications  levETIRAcetam (KEPPRA) 500 mg in sodium chloride 0.9 % 100 mL IVPB (not administered)  pantoprazole (PROTONIX) EC tablet 40 mg (not administered)  gabapentin (NEURONTIN) capsule 400 mg (not administered)  levothyroxine (SYNTHROID, LEVOTHROID) tablet 75 mcg (not administered)  Doxepin HCl TABS 6 mg (not administered)  simvastatin (ZOCOR) tablet 10 mg (not administered)  tiZANidine (ZANAFLEX) tablet 4 mg (not administered)  zolpidem (AMBIEN) tablet 5 mg (not administered)  venlafaxine XR (EFFEXOR-XR) 24 hr capsule 75 mg (not administered)  atenolol (TENORMIN) tablet 50 mg (not administered)  multivitamin with minerals tablet 1 tablet (not administered)  ALPRAZolam (XANAX) tablet 1 mg (not administered)  furosemide (LASIX) tablet 20 mg (not administered)  hyoscyamine (LEVBID) 0.375 MG 12 hr tablet 0.375 mg (not administered)  potassium chloride 20 MEQ/15ML (10%) solution 40 mEq (not administered)  hydrALAZINE (APRESOLINE) injection 5 mg (not administered)  LORazepam (ATIVAN) injection 1 mg (not administered)   stroke: mapping our early stages of recovery book (not administered)  senna-docusate (Senokot-S) tablet 1 tablet (not administered)  enoxaparin (LOVENOX) injection 40 mg (not administered)  aspirin  suppository 300 mg (not administered)    Or  aspirin tablet 325 mg (not administered)  acetaminophen (TYLENOL) tablet 650 mg (not administered)    Or  acetaminophen (TYLENOL) suppository 650 mg (not administered)  hydrOXYzine (VISTARIL) injection 25 mg (not administered)  hydrALAZINE (APRESOLINE) injection 10 mg (10 mg Intravenous Given 11/10/16 2223)     Initial Impression / Assessment and Plan / ED Course  I have reviewed the triage vital signs and the nursing notes.  Pertinent labs & imaging results that were available during my care of the patient were reviewed by me and considered in my medical decision making (see chart for details).  Clinical Course   Patient's initial blood pressure greater than [id number] systolic. Patient received hydralazine, family members confirm that the patient is only taking atenolol. Not long after the patient's initial evaluation she had a witnessed seizure, has a minor lip laceration, minimal bleeding. Patient received Keppra after additional consultation with our neurology colleagues.  On repeat exam the patient's blood pressure has improved substantially, no additional seizure activity, she is slightly more interactive.  Patient with history of prior strokes presents after an episode of change in cognition. Here the patient is slow to respond, but is moving all extremity spontaneously. There is a lower suspicion for acute stroke, but given the patient's seizure activity here, there is some suspicion for this entity. Patient received Keppra after discussion with neurology, was admitted to the hospitalist team for further evaluation and management.  Final Clinical Impressions(s) / ED Diagnoses   Final diagnoses:  Chronic diastolic CHF (congestive heart failure) ([place])  Delirium  Seizure ([place])  Acute encephalopathy     Carmin Muskrat, MD 11/11/16 0010

## 2016-11-10 NOTE — ED Notes (Signed)
This RN was called into the room by the pt's husband.  The Pt appeared to be having a seizure. Pt was rigid in bed not responding to voice or touch. Pt appeared to have bitten her lip. Pt's SPO2 dropped down to the 50's and I placed  the pt on 10L  Non-rebreather. Pt was unresponsive for 45 seconds but her SP02 stayed at 100%.  Once the pt was lucid she could only answer what her first name was. Pt keeps saying " I feel sick, I feel sick".

## 2016-11-11 ENCOUNTER — Observation Stay (HOSPITAL_COMMUNITY): Payer: Medicare Other

## 2016-11-11 DIAGNOSIS — I5042 Chronic combined systolic (congestive) and diastolic (congestive) heart failure: Secondary | ICD-10-CM | POA: Diagnosis present

## 2016-11-11 DIAGNOSIS — F10231 Alcohol dependence with withdrawal delirium: Secondary | ICD-10-CM | POA: Diagnosis present

## 2016-11-11 DIAGNOSIS — I160 Hypertensive urgency: Secondary | ICD-10-CM

## 2016-11-11 DIAGNOSIS — Z888 Allergy status to other drugs, medicaments and biological substances status: Secondary | ICD-10-CM | POA: Diagnosis not present

## 2016-11-11 DIAGNOSIS — Z96652 Presence of left artificial knee joint: Secondary | ICD-10-CM | POA: Diagnosis present

## 2016-11-11 DIAGNOSIS — Z9049 Acquired absence of other specified parts of digestive tract: Secondary | ICD-10-CM | POA: Diagnosis not present

## 2016-11-11 DIAGNOSIS — F329 Major depressive disorder, single episode, unspecified: Secondary | ICD-10-CM | POA: Diagnosis present

## 2016-11-11 DIAGNOSIS — E785 Hyperlipidemia, unspecified: Secondary | ICD-10-CM | POA: Diagnosis present

## 2016-11-11 DIAGNOSIS — I10 Essential (primary) hypertension: Secondary | ICD-10-CM | POA: Diagnosis not present

## 2016-11-11 DIAGNOSIS — G4089 Other seizures: Secondary | ICD-10-CM | POA: Diagnosis present

## 2016-11-11 DIAGNOSIS — R1110 Vomiting, unspecified: Secondary | ICD-10-CM | POA: Diagnosis not present

## 2016-11-11 DIAGNOSIS — R4182 Altered mental status, unspecified: Secondary | ICD-10-CM | POA: Diagnosis not present

## 2016-11-11 DIAGNOSIS — R509 Fever, unspecified: Secondary | ICD-10-CM | POA: Diagnosis not present

## 2016-11-11 DIAGNOSIS — G9340 Encephalopathy, unspecified: Secondary | ICD-10-CM | POA: Diagnosis not present

## 2016-11-11 DIAGNOSIS — I5032 Chronic diastolic (congestive) heart failure: Secondary | ICD-10-CM | POA: Diagnosis not present

## 2016-11-11 DIAGNOSIS — E876 Hypokalemia: Secondary | ICD-10-CM | POA: Diagnosis not present

## 2016-11-11 DIAGNOSIS — I161 Hypertensive emergency: Secondary | ICD-10-CM | POA: Diagnosis present

## 2016-11-11 DIAGNOSIS — K5641 Fecal impaction: Secondary | ICD-10-CM | POA: Diagnosis present

## 2016-11-11 DIAGNOSIS — F13931 Sedative, hypnotic or anxiolytic use, unspecified with withdrawal delirium: Secondary | ICD-10-CM | POA: Diagnosis not present

## 2016-11-11 DIAGNOSIS — K589 Irritable bowel syndrome without diarrhea: Secondary | ICD-10-CM | POA: Diagnosis present

## 2016-11-11 DIAGNOSIS — I4891 Unspecified atrial fibrillation: Secondary | ICD-10-CM | POA: Diagnosis not present

## 2016-11-11 DIAGNOSIS — K219 Gastro-esophageal reflux disease without esophagitis: Secondary | ICD-10-CM | POA: Diagnosis present

## 2016-11-11 DIAGNOSIS — F419 Anxiety disorder, unspecified: Secondary | ICD-10-CM | POA: Diagnosis not present

## 2016-11-11 DIAGNOSIS — Z781 Physical restraint status: Secondary | ICD-10-CM | POA: Diagnosis not present

## 2016-11-11 DIAGNOSIS — R410 Disorientation, unspecified: Secondary | ICD-10-CM | POA: Diagnosis not present

## 2016-11-11 DIAGNOSIS — F13231 Sedative, hypnotic or anxiolytic dependence with withdrawal delirium: Secondary | ICD-10-CM | POA: Diagnosis present

## 2016-11-11 DIAGNOSIS — Z8249 Family history of ischemic heart disease and other diseases of the circulatory system: Secondary | ICD-10-CM | POA: Diagnosis not present

## 2016-11-11 DIAGNOSIS — I110 Hypertensive heart disease with heart failure: Secondary | ICD-10-CM | POA: Diagnosis present

## 2016-11-11 DIAGNOSIS — Z79899 Other long term (current) drug therapy: Secondary | ICD-10-CM | POA: Diagnosis not present

## 2016-11-11 DIAGNOSIS — E860 Dehydration: Secondary | ICD-10-CM | POA: Diagnosis present

## 2016-11-11 DIAGNOSIS — I480 Paroxysmal atrial fibrillation: Secondary | ICD-10-CM | POA: Diagnosis not present

## 2016-11-11 DIAGNOSIS — E039 Hypothyroidism, unspecified: Secondary | ICD-10-CM | POA: Diagnosis not present

## 2016-11-11 DIAGNOSIS — Z96612 Presence of left artificial shoulder joint: Secondary | ICD-10-CM | POA: Diagnosis present

## 2016-11-11 LAB — URINE MICROSCOPIC-ADD ON: SQUAMOUS EPITHELIAL / LPF: NONE SEEN

## 2016-11-11 LAB — CBC
HEMATOCRIT: 36 % (ref 36.0–46.0)
Hemoglobin: 12.1 g/dL (ref 12.0–15.0)
MCH: 29.7 pg (ref 26.0–34.0)
MCHC: 33.6 g/dL (ref 30.0–36.0)
MCV: 88.2 fL (ref 78.0–100.0)
Platelets: 226 10*3/uL (ref 150–400)
RBC: 4.08 MIL/uL (ref 3.87–5.11)
RDW: 13.6 % (ref 11.5–15.5)
WBC: 9.6 10*3/uL (ref 4.0–10.5)

## 2016-11-11 LAB — COMPREHENSIVE METABOLIC PANEL
ALBUMIN: 3.4 g/dL — AB (ref 3.5–5.0)
ALT: 19 U/L (ref 14–54)
ANION GAP: 15 (ref 5–15)
AST: 25 U/L (ref 15–41)
Alkaline Phosphatase: 84 U/L (ref 38–126)
BILIRUBIN TOTAL: 1.3 mg/dL — AB (ref 0.3–1.2)
BUN: 13 mg/dL (ref 6–20)
CO2: 22 mmol/L (ref 22–32)
Calcium: 8.6 mg/dL — ABNORMAL LOW (ref 8.9–10.3)
Chloride: 104 mmol/L (ref 101–111)
Creatinine, Ser: 0.97 mg/dL (ref 0.44–1.00)
GFR, EST NON AFRICAN AMERICAN: 55 mL/min — AB (ref >60)
Glucose, Bld: 132 mg/dL — ABNORMAL HIGH (ref 65–99)
POTASSIUM: 3 mmol/L — AB (ref 3.5–5.1)
Sodium: 141 mmol/L (ref 135–145)
TOTAL PROTEIN: 6.2 g/dL — AB (ref 6.5–8.1)

## 2016-11-11 LAB — TSH: TSH: 1.125 u[IU]/mL (ref 0.350–4.500)

## 2016-11-11 LAB — PROTIME-INR
INR: 1.01
Prothrombin Time: 13.3 seconds (ref 11.4–15.2)

## 2016-11-11 LAB — URINALYSIS, ROUTINE W REFLEX MICROSCOPIC
BILIRUBIN URINE: NEGATIVE
Glucose, UA: NEGATIVE mg/dL
Ketones, ur: NEGATIVE mg/dL
Leukocytes, UA: NEGATIVE
NITRITE: NEGATIVE
Protein, ur: 100 mg/dL — AB
SPECIFIC GRAVITY, URINE: 1.01 (ref 1.005–1.030)
pH: 6 (ref 5.0–8.0)

## 2016-11-11 LAB — LIPID PANEL
CHOLESTEROL: 204 mg/dL — AB (ref 0–200)
HDL: 61 mg/dL (ref >40)
LDL CALC: 120 mg/dL — AB (ref 0–99)
TRIGLYCERIDES: 117 mg/dL (ref <150)
Total CHOL/HDL Ratio: 3.3 RATIO
VLDL: 23 mg/dL (ref 0–40)

## 2016-11-11 LAB — HEMOGLOBIN A1C
HEMOGLOBIN A1C: 5.4 % (ref 4.8–5.6)
MEAN PLASMA GLUCOSE: 108 mg/dL

## 2016-11-11 LAB — CBG MONITORING, ED: GLUCOSE-CAPILLARY: 145 mg/dL — AB (ref 65–99)

## 2016-11-11 LAB — BRAIN NATRIURETIC PEPTIDE: B Natriuretic Peptide: 257.8 pg/mL — ABNORMAL HIGH (ref 0.0–100.0)

## 2016-11-11 LAB — MAGNESIUM: Magnesium: 1.6 mg/dL — ABNORMAL LOW (ref 1.7–2.4)

## 2016-11-11 MED ORDER — LORAZEPAM 2 MG/ML IJ SOLN: 1.0000 mg | Freq: Four times a day (QID) | INTRAMUSCULAR | Status: DC | PRN

## 2016-11-11 MED ORDER — THIAMINE HCL 100 MG/ML IJ SOLN
100.0000 mg | Freq: Every day | INTRAMUSCULAR | Status: DC
  Administered 2016-11-12: 100 mg via INTRAVENOUS
  Filled 2016-11-11: qty 2

## 2016-11-11 MED ORDER — LORAZEPAM 1 MG PO TABS: 1.0000 mg | ORAL_TABLET | Freq: Four times a day (QID) | ORAL | Status: DC | PRN

## 2016-11-11 MED ORDER — SODIUM CHLORIDE 0.9 % IV SOLN
1.5000 g | Freq: Four times a day (QID) | INTRAVENOUS | Status: DC
  Administered 2016-11-11 – 2016-11-12 (×4): 1.5 g via INTRAVENOUS
  Filled 2016-11-11 (×6): qty 1.5

## 2016-11-11 MED ORDER — FOLIC ACID 1 MG PO TABS: 1.0000 mg | ORAL_TABLET | Freq: Every day | ORAL | Status: DC

## 2016-11-11 MED ORDER — SODIUM CHLORIDE 0.9 % IV SOLN
30.0000 meq | Freq: Once | INTRAVENOUS | Status: AC
  Administered 2016-11-11: 30 meq via INTRAVENOUS
  Filled 2016-11-11: qty 15

## 2016-11-11 MED ORDER — LORAZEPAM 2 MG/ML IJ SOLN
1.0000 mg | Freq: Once | INTRAMUSCULAR | Status: AC
  Administered 2016-11-11: 1 mg via INTRAVENOUS
  Filled 2016-11-11: qty 1

## 2016-11-11 MED ORDER — ONDANSETRON HCL 4 MG/2ML IJ SOLN
4.0000 mg | Freq: Once | INTRAMUSCULAR | Status: AC
  Administered 2016-11-11: 4 mg via INTRAVENOUS
  Filled 2016-11-11: qty 2

## 2016-11-11 MED ORDER — ADULT MULTIVITAMIN W/MINERALS CH: 1.0000 | ORAL_TABLET | Freq: Every day | ORAL | Status: DC

## 2016-11-11 MED ORDER — VITAMIN B-1 100 MG PO TABS
100.0000 mg | ORAL_TABLET | Freq: Every day | ORAL | Status: DC
  Administered 2016-11-13 – 2016-11-14 (×2): 100 mg via ORAL
  Filled 2016-11-11 (×3): qty 1

## 2016-11-11 MED ORDER — ONDANSETRON HCL 4 MG/2ML IJ SOLN
4.0000 mg | Freq: Four times a day (QID) | INTRAMUSCULAR | Status: DC | PRN
  Administered 2016-11-11 – 2016-11-16 (×6): 4 mg via INTRAVENOUS
  Filled 2016-11-11 (×8): qty 2

## 2016-11-11 MED ORDER — LIP MEDEX EX OINT
TOPICAL_OINTMENT | CUTANEOUS | Status: DC | PRN
  Filled 2016-11-11: qty 7

## 2016-11-11 MED ORDER — MAGNESIUM SULFATE 2 GM/50ML IV SOLN: 2.0000 g | Freq: Once | INTRAVENOUS | Status: DC

## 2016-11-11 MED ORDER — BLISTEX MEDICATED EX OINT
TOPICAL_OINTMENT | CUTANEOUS | Status: DC | PRN
  Administered 2016-11-11: 12:00:00 via TOPICAL
  Filled 2016-11-11: qty 6.3

## 2016-11-11 NOTE — Progress Notes (Signed)
Pt on telemetry monitoring.

## 2016-11-11 NOTE — ED Notes (Signed)
Gave mouth swabs and cup of water so patient could wet mouth.

## 2016-11-11 NOTE — Progress Notes (Signed)
[facility] PROGRESS NOTE  [name] [name] [id number] DOB: Jul 08, 1936 DOA: 11/10/2016  PCP: Birdie Sons, MD (Inactive)  Brief History/Interval Summary: 80 year old Caucasian female with a past medical history of hypertension, hyperlipidemia, GERD, hypothyroidism, history of stroke, presented from home with complaints of altered mental status and seizure activity. History was provided by the patient's husband. She does not have any known history of seizure disorder. Patient was found to have elevated blood pressure. She was seen by neurology. Hospitalized for further management.  Reason for Visit: Seizure  Consultants: Neurology  Procedures: EEG is pending  Antibiotics: None currently  Subjective/Interval History: Patient appears to be distracted and confused this morning. Unable to answer any questions. Her husband is at the bedside.  ROS: Unable to do due to altered mental status  Objective:  Vital Signs  Vitals:   11/11/16 0715 11/11/16 0730 11/11/16 0745 11/11/16 1014  BP: 153/74 155/74 156/94   Pulse: 83 81 90   Resp: 25 19 22    Temp:    100.6 F (38.1 C)  TempSrc:    Rectal  SpO2: 96% 94% 90%   Weight:      Height:        Intake/Output Summary (Last 24 hours) at 11/11/16 1136 Last data filed at 11/11/16 1015  Gross per 24 hour  Intake              120 ml  Output              650 ml  Net             -530 ml   Filed Weights   11/10/16 2215  Weight: 67.7 kg (149 lb 4 oz)    General appearance: alert, appears stated age, distracted, no distress and slowed mentation Resp: Diminished air entry at the bases. Crackles or wheezing. No rhonchi. Cardio: regular rate and rhythm, S1, S2 normal, no murmur, click, rub or gallop GI: Abdomen is soft, nondistended. Prominence is palpated in the suprapubic area. Slightly tender in that area. Bowel sounds present. No masses or organomegaly otherwise. Extremities: extremities normal, atraumatic, no  cyanosis or edema Neurologic: She is awake, alert. Slightly tremulous. No facial asymmetry. Motor strength is equal bilateral upper and lower extremities. No definite pronator drift. Some difficulty in getting the patient to cooperate with examination.  Lab Results:  Data Reviewed: I have personally reviewed following labs and imaging studies  CBC:  Recent Labs Lab 11/10/16 2152 11/10/16 2158  WBC 7.9  --   NEUTROABS 5.4  --   HGB 12.6 12.2  HCT 37.7 36.0  MCV 89.1  --   PLT 182  --     Basic Metabolic Panel:  Recent Labs Lab 11/10/16 2152 11/10/16 2158 11/11/16 0200  NA 140 142  --   K 3.1* 3.1*  --   CL 105 104  --   CO2 26  --   --   GLUCOSE 137* 132*  --   BUN 14 17  --   CREATININE 0.84 0.90  --   CALCIUM 8.6*  --   --   MG  --   --  1.6*    GFR: Estimated Creatinine Clearance: 45.4 mL/min (by C-G formula based on SCr of 0.9 mg/dL).  Liver Function Tests:  Recent Labs Lab 11/10/16 2152  AST 26  ALT 19  ALKPHOS 95  BILITOT 0.8  PROT 6.3*  ALBUMIN 3.3*    Coagulation Profile:  Recent Labs Lab 11/10/16 2152 11/10/16  2348  INR 1.01 1.01    CBG:  Recent Labs Lab 11/10/16 2151 11/11/16 0813  GLUCAP 127* 145*    Lipid Profile:  Recent Labs  11/11/16 0202  CHOL 204*  HDL 61  LDLCALC 120*  TRIG 117  CHOLHDL 3.3    Thyroid Function Tests:  Recent Labs  11/11/16 0201  TSH 1.125     Radiology Studies: Mr Brain Wo Contrast  Result Date: 11/11/2016 CLINICAL DATA:  Acute encephalopathy EXAM: MRI HEAD WITHOUT CONTRAST TECHNIQUE: Multiplanar, multiecho pulse sequences of the brain and surrounding structures were obtained without intravenous contrast. COMPARISON:  CT head 11/10/2016 FINDINGS: Brain: Generalized atrophy. Negative for hydrocephalus. Negative for acute infarct. Scattered small white matter hyperintensities consistent with chronic microvascular ischemia. Mild chronic ischemia in the central pons. Negative for hemorrhage  or mass. No shift of the midline structures. Vascular: Normal arterial flow voids. Skull and upper cervical spine: Negative Sinuses/Orbits: Bilateral lens replacement. Paranasal sinuses clear. Other: None IMPRESSION: Atrophy and mild chronic microvascular ischemic change. No acute abnormality. Electronically Signed   By: Franchot Gallo M.D.   On: 11/11/2016 06:56   Ct Head Code Stroke W/o Cm  Addendum Date: 11/10/2016   ADDENDUM REPORT: 11/10/2016 22:21 ADDENDUM: These results were called by telephone at the time of interpretation on 11/10/2016 at 10:20 pm to Dr. Carmin Muskrat , who verbally acknowledged these results. Electronically Signed   By: Kristine Garbe M.D.   On: 11/10/2016 22:21   Result Date: 11/10/2016 CLINICAL DATA:  Code stroke.  Acute confusion and weakness. EXAM: CT HEAD WITHOUT CONTRAST TECHNIQUE: Contiguous axial images were obtained from the base of the skull through the vertex without intravenous contrast. COMPARISON:  09/06/2016 CT of the head. FINDINGS: Brain: No evidence of acute infarction, hemorrhage, hydrocephalus, extra-axial collection or mass lesion/mass effect. Few stable nonspecific foci of white matter hypoattenuation are compatible with mild chronic microvascular ischemic changes. Mild brain parenchymal volume loss. Stable lucency in pons compatible with chronic lacunar infarct. Vascular: No hyperdense vessel. Calcific atherosclerosis of cavernous internal carotid arteries. Skull: Normal. Negative for fracture or focal lesion. Sinuses/Orbits: No acute finding. Other: None. ASPECTS [facility] Stroke Program Early CT Score) - Ganglionic level infarction (caudate, lentiform nuclei, internal capsule, insula, M1-M3 cortex): 7 - Supraganglionic infarction (M4-M6 cortex): 3 Total score (0-10 with 10 being normal): 10 IMPRESSION: 1. No acute intracranial abnormality. 2. Mild chronic microvascular ischemic changes and brain parenchymal volume loss. 3. ASPECTS is 10  Electronically Signed: By: Kristine Garbe M.D. On: 11/10/2016 22:13     Medications:  Scheduled: .  stroke: mapping our early stages of recovery book   Does not apply Once  . aspirin  300 mg Rectal Daily   Or  . aspirin  325 mg Oral Daily  . atenolol  50 mg Oral q morning - 10a  . enoxaparin (LOVENOX) injection  40 mg Subcutaneous Daily  . furosemide  20 mg Oral Q breakfast  . gabapentin  400 mg Oral 6 X Daily  . levothyroxine  75 mcg Oral QAC breakfast  . ondansetron (ZOFRAN) IV  4 mg Intravenous Once  . pantoprazole  40 mg Oral Daily  . potassium chloride  40 mEq Oral Once  . simvastatin  10 mg Oral QHS  . venlafaxine XR  75 mg Oral Q breakfast  . zolpidem  5 mg Oral QHS   Continuous: . levETIRAcetam    . magnesium sulfate 1 - 4 g bolus IVPB     [id number] **OR** acetaminophen,  ALPRAZolam, hydrALAZINE, hydrOXYzine, hyoscyamine, lip balm, LORazepam, multivitamin with minerals, ondansetron (ZOFRAN) IV, senna-docusate, tiZANidine  Assessment/Plan:  Principal Problem:   Acute encephalopathy Active Problems:   GERD (gastroesophageal reflux disease)   Anxiety   Hypokalemia   Chronic diastolic CHF (congestive heart failure) (H[facility])   Hypothyroidism   Hypertensive urgency   Altered mental state   Seizure ([place])     Acute encephalopathy Encephalopathy is most likely due to seizures. CT head was unremarkable. MRI brain does not show any stroke or any other concerning findings. EEG is pending. Reorient daily.   Seizures. No known history of same. Continue seizure precautions. Continue Keppra. Neurochecks. EEG is pending. MRI as mentioned above.  Low-grade fever UA does not suggest any infection. Get blood cultures, chest x-ray. Repeat labs. Acetaminophen as needed.  Hypertensive urgency Blood pressure was 251/95 . At initial assessment. Responded to intravenous hydralazine. Continue her home medications. Hydralazine as needed.  Chronic systolic  congestive heart failure 2-D echo 09/05/14 showed EF 60-65 percent with grade 1 diastolic dysfunction. Patient is taking low-dose Lasix, 20 mg daily at home. No JVD, has only trace amount of leg edema. CHF is compensated on admission. Continue atenolol and aspirin. Continue home dose Lasix.   Hyperlipidemia  Continue home medications. LDL is 120.  GERD: Protonix  Depression and anxiety: Continue home medications. Could consider holding her psychotropic medications if her confusion persists.  Hypokalemia and hypomagnesemia These have been repleted. Repeat labs tomorrow morning.  Hypothyroidism Continue home Synthroid. TSH is normal.  DVT Prophylaxis: Lovenox   Code Status: Full code  Family Communication: Discussed with the patient and her husband  Disposition Plan: Management as outlined above. PT and OT evaluation.    LOS: 0 days   [facility] Pager [phone] 11/11/2016, 11:36 AM  If 7PM-7AM, please contact night-coverage at [email or web address], password [facility]

## 2016-11-11 NOTE — Progress Notes (Signed)
Pt had x1 episode of green emesis.

## 2016-11-11 NOTE — Procedures (Signed)
Electroencephalogram (EEG) Report  Date of study: 11/11/16  Requesting clinician: Ivor Costa, MD  Reason for study: Evaluate for seizure  Brief clinical history: This is a 58-yo woman admitted with delirium. EEG is performed for further evaluation.   Medications:  Current Facility-Administered Medications:  .   stroke: mapping our early stages of recovery book, , Does not apply, Once, [name] Costa, MD .  acetaminophen (TYLENOL) tablet 650 mg, 650 mg, Oral, Q4H PRN, 650 mg at 11/11/16 0247 **OR** acetaminophen (TYLENOL) suppository 650 mg, 650 mg, Rectal, Q4H PRN, [name] Costa, MD .  ALPRAZolam Duanne Moron) tablet 1 mg, 1 mg, Oral, QID PRN, [name] Costa, MD .  ampicillin-sulbactam (UNASYN) 1.5 g in sodium chloride 0.9 % 50 mL IVPB, 1.5 g, Intravenous, Q6H, [name] Haff, MD, 1.5 g at 11/11/16 1540 .  aspirin suppository 300 mg, 300 mg, Rectal, Daily **OR** aspirin tablet 325 mg, 325 mg, Oral, Daily, [name] Costa, MD, Stopped at 11/11/16 1136 .  atenolol (TENORMIN) tablet 50 mg, 50 mg, Oral, q morning - 10a, [name] Costa, MD, Stopped at 11/11/16 1137 .  enoxaparin (LOVENOX) injection 40 mg, 40 mg, Subcutaneous, Daily, [name] Costa, MD .  furosemide (LASIX) tablet 20 mg, 20 mg, Oral, Q breakfast, [name] Costa, MD, Stopped at 11/11/16 1137 .  gabapentin (NEURONTIN) capsule 400 mg, 400 mg, Oral, 6 X Daily, [name] Costa, MD, Stopped at 11/11/16 1138 .  hydrALAZINE (APRESOLINE) injection 5 mg, 5 mg, Intravenous, Q2H PRN, [name] Costa, MD, 5 mg at 11/11/16 1722 .  hydrOXYzine (VISTARIL) injection 25 mg, 25 mg, Intramuscular, Q6H PRN, [name] Costa, MD .  hyoscyamine (LEVBID) 0.375 MG 12 hr tablet 0.375 mg, 0.375 mg, Oral, Q12H PRN, [name] Costa, MD .  levETIRAcetam (KEPPRA) 500 mg in sodium chloride 0.9 % 100 mL IVPB, 500 mg, Intravenous, BID, [name] Muskrat, MD, Stopped at 11/11/16 1433 .  levothyroxine (SYNTHROID, LEVOTHROID) tablet 75 mcg, 75 mcg, Oral, QAC breakfast, [name] Costa, MD, Stopped at 11/11/16 1139 .  lip balm (BLISTEX)  ointment, , Topical, PRN, [name] Haff, MD .  LORazepam (ATIVAN) injection 1 mg, 1 mg, Intravenous, Q2H PRN, [name] Costa, MD .  magnesium sulfate IVPB 2 g 50 mL, 2 g, Intravenous, Once, [name] Costa, MD .  multivitamin with minerals tablet 1 tablet, 1 tablet, Oral, Daily PRN, [name] Costa, MD .  ondansetron [facility]) injection 4 mg, 4 mg, Intravenous, Q6H PRN, [name] Haff, MD, 4 mg at 11/11/16 1535 .  pantoprazole (PROTONIX) EC tablet 40 mg, 40 mg, Oral, Daily, [name] Costa, MD, Stopped at 11/11/16 1139 .  potassium chloride 20 MEQ/15ML (10%) solution 40 mEq, 40 mEq, Oral, Once, [name] Costa, MD .  potassium chloride 30 mEq in sodium chloride 0.9 % 265 mL (KCL MULTIRUN) IVPB, 30 mEq, Intravenous, Once, [name] Haff, MD, 30 mEq at 11/11/16 1759 .  senna-docusate (Senokot-S) tablet 1 tablet, 1 tablet, Oral, QHS PRN, [name] Costa, MD .  simvastatin (ZOCOR) tablet 10 mg, 10 mg, Oral, QHS, [name] Costa, MD .  tiZANidine (ZANAFLEX) tablet 4 mg, 4 mg, Oral, TID PRN, [name] Costa, MD .  venlafaxine XR (EFFEXOR-XR) 24 hr capsule 75 mg, 75 mg, Oral, Q breakfast, [name] Costa, MD, Stopped at 11/11/16 1140 .  zolpidem (AMBIEN) tablet 5 mg, 5 mg, Oral, QHS, [name] Costa, MD  Description: This is a routine EEG performed using standard international 10-20 electrode placement. A total of 18 channels are recorded, including one for the EKG. Wakefulness and drowsiness are recorded.   Activating Maneuvers: None  Findings:  The EKG channel demonstrates a regular rhythm with a rate of 80-90 beats per minute.   The background consists of alpha activity averaging about 8 Hz. The best dominant posterior rhythm is 9 Hz. This is symmetric and reacts as expected with eye opening. There is a mild degree of intermixed theta.  There are no focal asymmetries. No epileptiform discharges are present. No seizures are recorded.   Frequent tremors are noted by the technician but there are no electrical correlates for these to suggest they are  seizure.   Drowsiness is recorded and is normal in appearance.   Impression: This is a mildly abnormal EEG due to mild intermixed slowing. This is suggestive of a mild encephalopathy but is nonspecific as to etiology. No evidence of seizure. Tremors are noted but have no electrical correlate.    [name] Coon, MD [facility]

## 2016-11-11 NOTE — Care Management Note (Signed)
Case Management Note  Patient Details  Name: AMELDA CUTAIA MRN: [id number] Date of Birth: [birth year]  Subjective/Objective:                  80 y.o. female with medical history significant of hypertension, hyperlipidemia, GERD, hypothyroidism, depression, PsyD, pancreatitis, stroke, dCHF, who presents with altered mental status and seizure./ From home with spouse.  Action/Plan: Follow for disposition needs.   Expected Discharge Date:  11/13/16               Expected Discharge Plan:  [place]  In-House Referral:  NA  Discharge planning Services  CM Consult  Post Acute Care Choice:    Choice offered to:     DME Arranged:    DME Agency:     HH Arranged:    HH Agency:     Status of Service:  In process, will continue to follow  If discussed at Long Length of Stay Meetings, dates discussed:    Additional Comments:  Fuller Mandril, RN 11/11/2016, 11:45 AM

## 2016-11-11 NOTE — ED Notes (Signed)
Bladder scan showed >490ml

## 2016-11-11 NOTE — ED Notes (Signed)
Admitting doctor notified of bladder scan results. He request a foley inserted.

## 2016-11-11 NOTE — Progress Notes (Signed)
Bedside EEG completed, results pending. 

## 2016-11-11 NOTE — Progress Notes (Signed)
Patient placed in 4 point soft wrist and ankle restraints per order. Patient given 1 mg ativan and 25 mg IV for nausea and vomiting. Patient and family informed and update on current treatment plan. RN will continue to monitor patient.

## 2016-11-11 NOTE — ED Notes (Signed)
Notified doctor of patient actively vomiting. Doctor requested that I hold all meds and give patient one time additional dose of zofran.

## 2016-11-11 NOTE — Progress Notes (Signed)
Pt received at this time from ED reporting extreme nausea. Administered prn Zofran. Pt alert, oriented x3. Safety measures in place. Pt oriented to room. Call bell within reach. Will continue to monitor.

## 2016-11-11 NOTE — ED Notes (Signed)
Dr at bedside.  Requested an order for nausea meds. Dr wants a bladder scan of bladder to make sure patient is not retaining.

## 2016-11-11 NOTE — ED Notes (Signed)
Notified Doctor of patient's increased temp and to discontinue the NPO order. Patient has passed swallow screen.

## 2016-11-11 NOTE — ED Notes (Addendum)
Patient presented to ED with stroke like symptoms.  CT and MRI were negative of stroke. Initial NIH was 2 related to questions being answered.  My NIH showed 1 with questions.  Patient is Alert/oriented x 4. Foley catheter placed due to urinary retention.  Potassium 3.0, Calcium 8.6, Albumin 3.4, Total Protein 6.2, BNP 257.8.  Latest temp was 100.6 Rectal.  Patient has been extremely nauseated and having vomiting spells. All meds were held due to vomiting and nausea per Admitting Doc.  IV's are placed in both left and right forearms.  Patient passed swallow screen.  EEG recently done in ED. Pt hard of hearing at times.  Husband normally at bedside.

## 2016-11-11 NOTE — ED Notes (Signed)
Pt in MRI.

## 2016-11-11 NOTE — Progress Notes (Signed)
RN reported seizure lasting 40 seconds to oncall MD. Patient seizure activity witnessed by both NT following patients BM. Patient had full body stiffness, hypoxic during and following event. RN applied oxygen which brought patient o2 level to 94%, patient placed on left side, RN provided oral suction and administered 1 mg ativan IV.

## 2016-11-12 ENCOUNTER — Inpatient Hospital Stay (HOSPITAL_COMMUNITY): Payer: Medicare Other

## 2016-11-12 DIAGNOSIS — R509 Fever, unspecified: Secondary | ICD-10-CM

## 2016-11-12 LAB — COMPREHENSIVE METABOLIC PANEL
ALT: 17 U/L (ref 14–54)
AST: 25 U/L (ref 15–41)
Albumin: 3.3 g/dL — ABNORMAL LOW (ref 3.5–5.0)
Alkaline Phosphatase: 83 U/L (ref 38–126)
Anion gap: 13 (ref 5–15)
BUN: 11 mg/dL (ref 6–20)
CHLORIDE: 108 mmol/L (ref 101–111)
CO2: 24 mmol/L (ref 22–32)
Calcium: 8.7 mg/dL — ABNORMAL LOW (ref 8.9–10.3)
Creatinine, Ser: 1.03 mg/dL — ABNORMAL HIGH (ref 0.44–1.00)
GFR, EST AFRICAN AMERICAN: 59 mL/min — AB (ref >60)
GFR, EST NON AFRICAN AMERICAN: 51 mL/min — AB (ref >60)
Glucose, Bld: 146 mg/dL — ABNORMAL HIGH (ref 65–99)
POTASSIUM: 3 mmol/L — AB (ref 3.5–5.1)
SODIUM: 145 mmol/L (ref 135–145)
Total Bilirubin: 0.9 mg/dL (ref 0.3–1.2)
Total Protein: 6.3 g/dL — ABNORMAL LOW (ref 6.5–8.1)

## 2016-11-12 LAB — BLOOD GAS, ARTERIAL
Acid-Base Excess: 2.3 mmol/L — ABNORMAL HIGH (ref 0.0–2.0)
BICARBONATE: 26 mmol/L (ref 20.0–28.0)
Drawn by: 330991
FIO2: 21
O2 Saturation: 90.5 %
PCO2 ART: 37 mmHg (ref 32.0–48.0)
PH ART: 7.459 — AB (ref 7.350–7.450)
PO2 ART: 59.1 mmHg — AB (ref 83.0–108.0)
Patient temperature: 98.2

## 2016-11-12 LAB — GLUCOSE, CAPILLARY: Glucose-Capillary: 146 mg/dL — ABNORMAL HIGH (ref 65–99)

## 2016-11-12 LAB — CSF CELL COUNT WITH DIFFERENTIAL
EOS CSF: 0 % (ref 0–1)
LYMPHS CSF: 8 % — AB (ref 40–80)
MONOCYTE-MACROPHAGE-SPINAL FLUID: 12 % — AB (ref 15–45)
OTHER CELLS CSF: 0
RBC COUNT CSF: 17 /mm3 — AB
SEGMENTED NEUTROPHILS-CSF: 80 % — AB (ref 0–6)
Tube #: 4
WBC, CSF: 18 /mm3 (ref 0–5)

## 2016-11-12 LAB — CBC
HEMATOCRIT: 35.9 % — AB (ref 36.0–46.0)
HEMOGLOBIN: 11.9 g/dL — AB (ref 12.0–15.0)
MCH: 29.8 pg (ref 26.0–34.0)
MCHC: 33.1 g/dL (ref 30.0–36.0)
MCV: 89.8 fL (ref 78.0–100.0)
PLATELETS: 232 10*3/uL (ref 150–400)
RBC: 4 MIL/uL (ref 3.87–5.11)
RDW: 14.1 % (ref 11.5–15.5)
WBC: 14.3 10*3/uL — ABNORMAL HIGH (ref 4.0–10.5)

## 2016-11-12 LAB — GLUCOSE, CSF: GLUCOSE CSF: 78 mg/dL — AB (ref 40–70)

## 2016-11-12 LAB — PROTEIN, CSF: TOTAL PROTEIN, CSF: 62 mg/dL — AB (ref 15–45)

## 2016-11-12 MED ORDER — WHITE PETROLATUM GEL
Status: AC
  Administered 2016-11-12: 04:00:00
  Filled 2016-11-12: qty 1

## 2016-11-12 MED ORDER — LIDOCAINE HCL 1 % IJ SOLN
INTRAMUSCULAR | Status: AC
  Filled 2016-11-12: qty 10

## 2016-11-12 MED ORDER — LIDOCAINE HCL (PF) 1 % IJ SOLN
5.0000 mL | Freq: Once | INTRAMUSCULAR | Status: DC
  Filled 2016-11-12: qty 5

## 2016-11-12 MED ORDER — VANCOMYCIN HCL IN DEXTROSE 1-5 GM/200ML-% IV SOLN
1000.0000 mg | INTRAVENOUS | Status: DC
  Administered 2016-11-13: 1000 mg via INTRAVENOUS
  Filled 2016-11-12 (×2): qty 200

## 2016-11-12 MED ORDER — MAGNESIUM SULFATE 2 GM/50ML IV SOLN
2.0000 g | Freq: Once | INTRAVENOUS | Status: AC
  Administered 2016-11-12: 2 g via INTRAVENOUS
  Filled 2016-11-12: qty 50

## 2016-11-12 MED ORDER — DEXTROSE 5 % IV SOLN
2.0000 g | Freq: Two times a day (BID) | INTRAVENOUS | Status: DC
  Administered 2016-11-12 – 2016-11-14 (×5): 2 g via INTRAVENOUS
  Filled 2016-11-12 (×6): qty 2

## 2016-11-12 MED ORDER — FLEET ENEMA 7-19 GM/118ML RE ENEM: 1.0000 | ENEMA | Freq: Once | RECTAL | Status: DC

## 2016-11-12 MED ORDER — LORAZEPAM 2 MG/ML IJ SOLN
2.0000 mg | Freq: Four times a day (QID) | INTRAMUSCULAR | Status: AC | PRN
  Filled 2016-11-12: qty 1

## 2016-11-12 MED ORDER — LORAZEPAM 1 MG PO TABS: 2.0000 mg | ORAL_TABLET | Freq: Four times a day (QID) | ORAL | Status: AC | PRN

## 2016-11-12 MED ORDER — SODIUM CHLORIDE 0.9 % IV SOLN
30.0000 meq | Freq: Once | INTRAVENOUS | Status: AC
  Administered 2016-11-12: 30 meq via INTRAVENOUS
  Filled 2016-11-12: qty 15

## 2016-11-12 MED ORDER — SODIUM CHLORIDE 0.9 % IV SOLN
2.0000 g | Freq: Four times a day (QID) | INTRAVENOUS | Status: DC
  Administered 2016-11-12 – 2016-11-14 (×7): 2 g via INTRAVENOUS
  Filled 2016-11-12 (×10): qty 2000

## 2016-11-12 MED ORDER — SODIUM CHLORIDE 0.9 % IV SOLN
1250.0000 mg | Freq: Once | INTRAVENOUS | Status: AC
  Administered 2016-11-12: 1250 mg via INTRAVENOUS
  Filled 2016-11-12: qty 1250

## 2016-11-12 MED ORDER — SODIUM CHLORIDE 0.9 % IV SOLN
INTRAVENOUS | Status: DC
  Administered 2016-11-12: 09:00:00 via INTRAVENOUS

## 2016-11-12 NOTE — Significant Event (Signed)
CRITICAL VALUE ALERT  Critical value received:  WBC 18  Date of notification:  11/12/16  Time of notification:  1909  Critical value read back:Yes.    Nurse who received alert:  Madalyn Rob, RN  MD notified (1st page):  M. Donnal Debar, NP  Time of first page:  1929  MD notified (2nd page):  Time of second page:  Responding MD:  M. Donnal Debar, NP  Time MD responded:  1931, no new orders received at this time

## 2016-11-12 NOTE — Procedures (Signed)
CLINICAL DATA: [Acute encephalopathy in altered mental status.] EXAM: DIAGNOSTIC LUMBAR PUNCTURE UNDER FLUOROSCOPIC GUIDANCE FLUOROSCOPY TIME: Fluoroscopy Time: [0 minutes, 36 seconds] Radiation Exposure Index (if provided by the fluoroscopic device): [2.8 mGy] Number of Acquired Spot Images: [0] PROCEDURE:  I discussed the risks (including hemorrhage, infection, headache, and nerve damage, among others), benefits, and alternatives to fluoroscopically guided lumbar puncture with the patient's husband, who is making her medical decisions at this time.  We specifically discussed the high technical likelihood of success of the procedure.  Mr. Kamber understood and elected for the patient to undergo the procedure.    Standard time-out was employed. Following sterile skin prep and local anesthetic administration consisting of 1 percent lidocaine, a 22 gauge spinal needle was advanced without difficulty into the thecal sac at the at the [L2-3] level. Clear CSF was returned.  Opening pressure was [18 cm of water].  However, this estimate was made with the patient supine, as I did not feel it was safe to try to turn her on her side due to the altered mental status.    13 cc of CSF was collected.  The first 2 both blood tinged, but this cleared on the subsequent tubes.  The needle was subsequently removed and the skin cleansed and bandaged. No immediate complications were observed.     IMPRESSION: [ 1. Technically successful fluoroscopically guided lumbar puncture, yielding 13 cc of CSF. No immediate complications observed.  ]

## 2016-11-12 NOTE — Progress Notes (Signed)
Neurology Progress Note  Subjective: Chart reviewed.  In brief, this is a 73-yo woman who initially presented to the ED on 11/10/16 as a CODE STROKE after she was noted to have the acute onset of confusion and altered mental status at home. She was seen by the on-call neurologist, Dr. Nicole Kindred in the ED. She was noted to have BP 215/92 with disorientation, no aphasia, no focal deficits. It was felt that her presentation was most likely due to hypertensive encephalopathy and he recommended MRI brain and EEG for further evaluation. In the ED she apparently had a witnessed seizure, presumably GTC in nature, for which she was given Keppra 1000 mg, followed by 500 mg BID. She was admitted to the hospitalist service for further evaluation. MRI brain showed no acute abnormality and EEG showed mild slowing but nothing epileptiform.   Last night she had a witnessed seizure on the floor. Per RN note, the nursing techs witnessed full body stiffening with hypoxia after she had a bowel movement. This lasted 40 seconds and she was given Ativan 1 mg IV. About an hour later she complained of headache to the RN who noted upper extremity tremor and diaphoresis. She also noted that the patient was reachign for objects that were not present. The patient's daughter told the RN that the "patient drinks alcohol and overmedicates herself." The RN discussed this with the patient who admitted taking extra doses of her Xanax but denied alcohol use. The patient required four-point soft limb restraints due to agitation. She had some nausea and vomiting for which she was given Ativan 1 mg IV and phenergan 25 mg IV. She was febrile overnight, peaking at 102.9 F this morning. She was given Tylenol and CXR, urine cx, and blood cx were ordered.   The patient is currently encephalopathy and non-verbal so she does not participate with ROS.   Current Meds:   Current Facility-Administered Medications:  .   stroke: mapping our early stages  of recovery book, , Does not apply, Once, Ivor Costa, MD .  0.9 %  sodium chloride infusion, , Intravenous, Continuous, Bonnielee Haff, MD .  acetaminophen (TYLENOL) tablet 650 mg, 650 mg, Oral, Q4H PRN, 650 mg at 11/11/16 0247 **OR** acetaminophen (TYLENOL) suppository 650 mg, 650 mg, Rectal, Q4H PRN, Ivor Costa, MD, 650 mg at 11/12/16 [id number] .  ampicillin-sulbactam (UNASYN) 1.5 g in sodium chloride 0.9 % 50 mL IVPB, 1.5 g, Intravenous, Q6H, Bonnielee Haff, MD, 1.5 g at 11/12/16 0644 .  aspirin suppository 300 mg, 300 mg, Rectal, Daily **OR** aspirin tablet 325 mg, 325 mg, Oral, Daily, Ivor Costa, MD, Stopped at 11/11/16 1136 .  enoxaparin (LOVENOX) injection 40 mg, 40 mg, Subcutaneous, Daily, Ivor Costa, MD .  hydrALAZINE (APRESOLINE) injection 5 mg, 5 mg, Intravenous, Q2H PRN, Ivor Costa, MD, 5 mg at 11/12/16 0253 .  levETIRAcetam (KEPPRA) 500 mg in sodium chloride 0.9 % 100 mL IVPB, 500 mg, Intravenous, BID, Carmin Muskrat, MD, Last Rate: 420 mL/hr at 11/12/16 0358, 500 mg at 11/12/16 0358 .  lip balm (BLISTEX) ointment, , Topical, PRN, Bonnielee Haff, MD .  LORazepam (ATIVAN) injection 1 mg, 1 mg, Intravenous, Q2H PRN, Ivor Costa, MD, 1 mg at 11/12/16 0210 .  LORazepam (ATIVAN) tablet 2 mg, 2 mg, Oral, Q6H PRN **OR** LORazepam (ATIVAN) injection 2 mg, 2 mg, Intravenous, Q6H PRN, Wallie Char .  magnesium sulfate IVPB 2 g 50 mL, 2 g, Intravenous, Once, Ivor Costa, MD .  ondansetron [facility]) injection 4 mg, 4 mg,  Intravenous, Q6H PRN, Bonnielee Haff, MD, 4 mg at 11/12/16 0247 .  potassium chloride 30 mEq in sodium chloride 0.9 % 265 mL (KCL MULTIRUN) IVPB, 30 mEq, Intravenous, Once, Bonnielee Haff, MD .  senna-docusate (Senokot-S) tablet 1 tablet, 1 tablet, Oral, QHS PRN, Ivor Costa, MD .  thiamine (VITAMIN B-1) tablet 100 mg, 100 mg, Oral, Daily **OR** thiamine (B-1) injection 100 mg, 100 mg, Intravenous, Daily, Wallie Char  Objective:  Temp:  [98.2 F (36.8 C)-102.9 F (39.4 C)] 99.7 F (37.6  C) (12/01 0846) Pulse Rate:  [76-113] 98 (12/01 0846) Resp:  [17-42] 20 (12/01 0846) BP: (132-215)/(66-141) 160/141 (12/01 0846) SpO2:  [84 %-99 %] 95 % (12/01 0846)  General: WDWN elderly woman lying in bed. She is initially sleeping and snoring through an open mouth. She rouses briefly during portions of the exam but goes immediately back to sleep again. She moans intermittently but is largely nonverbal except for saying "stop that" when I was checking her reflexes. HEENT: Neck is supple without lymphadenopathy. Sclerae are anicteric. There is mild conjunctival injection.  CV: Tachy, regular, no murmur. Carotid pulses are 2+ and symmetric with no bruits. Distal pulses 2+ and symmetric.  Lungs: CTAB on anterior exam Extremities: No C/C/E. Soft limb restraints are in place.  Neuro: MS: As noted above. No aphasia.  CN: Pupils are equal and reactive from 3-->2 mm bilaterally. She blinks to threat x4. Eyes are conjugate. Corneals are intact and symmetric. Face is symmetric at rest with symmetric grimace. She resists passive eye opening with good strength. The remainder of her cranial nerves cannot be accurately assessed as she does not participate with the exam.   Motor: Normal bulk, tone. She had gegenhalten paratonia. She moves and pulls against restraints with good strength x4. She had some mild tremulousness when alert.  Sensation: She withdraws from light noxious stimulation x4.  DTRs: Brisk 2+, symmetric. Toes are downgoing bilaterally. No pathological reflexes.  Coordination and gait: These cannot be assessed as the patient is unable to participate.   Labs: Lab Results  Component Value Date   WBC 14.3 (H) 11/12/2016   HGB 11.9 (L) 11/12/2016   HCT 35.9 (L) 11/12/2016   PLT 232 11/12/2016   GLUCOSE 146 (H) 11/12/2016   CHOL 204 (H) 11/11/2016   TRIG 117 11/11/2016   HDL 61 11/11/2016   LDLCALC 120 (H) 11/11/2016   ALT 17 11/12/2016   AST 25 11/12/2016   NA 145 11/12/2016   K 3.0  (L) 11/12/2016   CL 108 11/12/2016   CREATININE 1.03 (H) 11/12/2016   BUN 11 11/12/2016   CO2 24 11/12/2016   TSH 1.125 11/11/2016   INR 1.01 11/10/2016   HGBA1C 5.4 11/11/2016   CBC Latest Ref Rng & Units 11/12/2016 11/11/2016 11/10/2016  WBC 4.0 - 10.5 K/uL 14.3(H) 9.6 -  Hemoglobin 12.0 - 15.0 g/dL 11.9(L) 12.1 12.2  Hematocrit 36.0 - 46.0 % 35.9(L) 36.0 36.0  Platelets 150 - 400 K/uL 232 226 -    Lab Results  Component Value Date   HGBA1C 5.4 11/11/2016   Lab Results  Component Value Date   ALT 17 11/12/2016   AST 25 11/12/2016   ALKPHOS 83 11/12/2016   BILITOT 0.9 11/12/2016    Radiology:  I have personally and independently reviewed the MRI brain without contrast from 11/11/16. This shows a moderate burden of chronic small vessel ischemic disease in the bihemispheric white matter and pons. There is mild to moderate diffuse generalized atrophy. No  acute ischemia or other acute intracranial pathology is present.   Other diagnostic studies:  EEG 11/11/16 showed mild diffuse generalized slowing with no epileptiform activity and no seizure.   A/P:   1. Seizure: Based upon available information, I suspect these are most likely due to EtOH/benzo withdrawal. Her fever, tachycardia, hypertension, diaphoresis, and hallucinations overnight are concerning for severe withdrawal vs DTs. CNS infection is possible though seems less likely given absence of meningismus and lack of initial fever. LP could be considered if she is able to tolerate the procedure safely given her agitation. EEG did not show any epileptic focus and MRI brain did not show any acute pathology. Continue Keppra. Continue Ativan per CIWA protocol. Seizure precautions.   2. Acute encephalopathy: This is most likely multifactorial in etiology with potential contributions from seizure and withdrawal. Continue to optimize metabolic status as you are. Continue to treat any underlying infection as needed. Minimize the use of  opiates or any medication with strong anticholinergic properties as much as possible. Benzos should be reserved for treatment of withdrawal symptoms per CIWA protocol or for seizure activity lasting more than two minutes. Optimize sleep-wake cycles as much as you can by keeping the room bright with activity during the day and dark and quiet at night.   3. Cerebrovascular disease: This is chronic with no acute stroke on MRI. Known risk factors in this patient include HTN, hyperlipidemia, possible EtOH abuse, and age. Continue risk factor modification. Continue aspirin, statin.   4. Alcohol withdrawal vs. Benzo withdrawal: Patient's daughter reported that the patient drinks excessively. The patient denied this to the RN but admitted to taking excessive Xanax. Continue CIWA protocol. Substance abuse counseling.   No family was present at the time of my visit.   [name], MD [facility]

## 2016-11-12 NOTE — Progress Notes (Signed)
Patient visible upper extremity tremor, diaphoretic, c/o of horrible h/a and continual reaching for objects that are not present. Patients daughter reported to RN that "patient drinks alcohol and overmedicates herself." Patients daughter states she encouraged her father to be honest with treatment team. RN spoke with patient with no family members present and patient reports taking extra doses of her xanax but denies alcohol use. All information reported to MD and CIWA order initiated.

## 2016-11-12 NOTE — Significant Event (Signed)
Rapid Response Event Note  Overview: Time Called: [id number] Arrival Time: [id number] Event Type: Neurologic, Respiratory  Initial Focused Assessment: Informed by [facility] that per nursing staff, patient was having frequent seizures and a SDU transfer might be needed.  I went to assess the patient.  Upon assessment, patient did respond to voice, did follow commands in all extremities, patient was not completely alert, but appeared encephalopathic.  Patient was mouth breathing but not in distress, clear lung sounds, + pulses, bruises all over (fall at home), + restraints for agitation.  Per primary RN patient's vitals have been stable, BP in the 150-160s/60s, HR 80-90s, on 2L [place].  Overnight patient did have fever and tachycardia along with HTN and diaphoresis --> patient might need LP. Patient is on CIWA for withdrawal.  Pupils are reactive.   Once I spoke to primary nurse, primary nurse stated patient has not had frequent seizures.    Interventions: -none-  Plan of Care (if not transferred): - per this assessment, patient does not need SDU level transfer.   - If LP is done at bedside, RR RN can help with sedation if it is needed. - Informed to call RR RN for changes or concerns  Event Summary:   at      at    Outcome: Stayed in room and stabalized  Event End Time: [place], [place]

## 2016-11-12 NOTE — Progress Notes (Signed)
Unable to administer po medication. Md notified. Temp 99.7

## 2016-11-12 NOTE — Progress Notes (Addendum)
[facility] PROGRESS NOTE  [name] [name] [id number] DOB: July 09, 1936 DOA: 11/10/2016  PCP: Birdie Sons, MD (Inactive)  Brief History/Interval Summary: 80 year old Caucasian female with a past medical history of hypertension, hyperlipidemia, GERD, hypothyroidism, history of stroke, presented from home with complaints of altered mental status and seizure activity. History was provided by the patient's husband. She does not have any known history of seizure disorder. Patient was found to have elevated blood pressure. She was seen by neurology. Hospitalized for further management. Patient became more encephalopathic. She had more episodes of seizures and became febrile.  Reason for Visit: Seizure  Consultants: Neurology  Procedures:  EEG "Impression: This is a mildly abnormal EEG due to mild intermixed slowing. This is suggestive of a mild encephalopathy but is nonspecific as to etiology. No evidence of seizure. Tremors are noted but have no electrical correlate. "  Antibiotics: Started on Unasyn last night. Changed over to ceftriaxone, vancomycin and ampicillin this morning for presumed CNS infection.  Subjective/Interval History: Patient very encephalopathic. Distracted and confused.   ROS: Unable to do due to altered mental status  Objective:  Vital Signs  Vitals:   11/12/16 0253 11/12/16 0422 11/12/16 0545 11/12/16 0846  BP: (!) 183/121 (!) 156/75 (!) 162/71 (!) 160/141  Pulse:  (!) 112 (!) 110 98  Resp:    20  Temp:  (!) 101.3 F (38.5 C) (!) 102.4 F (39.1 C) 99.7 F (37.6 C)  TempSrc:  Axillary Axillary Oral  SpO2:    95%  Weight:      Height:        Intake/Output Summary (Last 24 hours) at 11/12/16 0900 Last data filed at 11/11/16 1433  Gross per 24 hour  Intake              115 ml  Output                0 ml  Net              115 ml   Filed Weights   11/10/16 2215  Weight: 67.7 kg (149 lb 4 oz)    General appearance: Awake, alert,  delirious Resp: Diminished air entry at the bases. Crackles or wheezing. No rhonchi. Cardio: regular rate and rhythm, S1, S2 normal, no murmur, click, rub or gallop GI: Abdomen is soft, nondistended. Bowel sounds present. No masses or organomegaly otherwise. Extremities: extremities normal, atraumatic, no cyanosis or edema Neurologic: Mental status is waxing and waning. She has been agitated. She is delirious. Currently, she is in restraints. Tremulous. Slight restriction with range of motion of neck. Unable to get the patient to cooperate with examination.  Lab Results:  Data Reviewed: I have personally reviewed following labs and imaging studies  CBC:  Recent Labs Lab 11/10/16 2152 11/10/16 2158 11/11/16 1217 11/12/16 0530  WBC 7.9  --  9.6 14.3*  NEUTROABS 5.4  --   --   --   HGB 12.6 12.2 12.1 11.9*  HCT 37.7 36.0 36.0 35.9*  MCV 89.1  --  88.2 89.8  PLT 182  --  226 [id number]    Basic Metabolic Panel:  Recent Labs Lab 11/10/16 2152 11/10/16 2158 11/11/16 0200 11/11/16 1217 11/12/16 0530  NA 140 142  --  141 145  K 3.1* 3.1*  --  3.0* 3.0*  CL 105 104  --  104 108  CO2 26  --   --  22 24  GLUCOSE 137* 132*  --  132* 146*  BUN 14 17  --  13 11  CREATININE 0.84 0.90  --  0.97 1.03*  CALCIUM 8.6*  --   --  8.6* 8.7*  MG  --   --  1.6*  --   --     GFR: Estimated Creatinine Clearance: 39.7 mL/min (by C-G formula based on SCr of 1.03 mg/dL (H)).  Liver Function Tests:  Recent Labs Lab 11/10/16 2152 11/11/16 1217 11/12/16 0530  AST 26 25 25   ALT 19 19 17   ALKPHOS 95 84 83  BILITOT 0.8 1.3* 0.9  PROT 6.3* 6.2* 6.3*  ALBUMIN 3.3* 3.4* 3.3*    Coagulation Profile:  Recent Labs Lab 11/10/16 2152 11/10/16 2348  INR 1.01 1.01    CBG:  Recent Labs Lab 11/10/16 2151 11/11/16 0813 11/12/16 0626  GLUCAP 127* 145* 146*    Lipid Profile:  Recent Labs  11/11/16 0202  CHOL 204*  HDL 61  LDLCALC 120*  TRIG 117  CHOLHDL 3.3    Thyroid Function  Tests:  Recent Labs  11/11/16 0201  TSH 1.125     Radiology Studies: Mr Brain Wo Contrast  Result Date: 11/11/2016 CLINICAL DATA:  Acute encephalopathy EXAM: MRI HEAD WITHOUT CONTRAST TECHNIQUE: Multiplanar, multiecho pulse sequences of the brain and surrounding structures were obtained without intravenous contrast. COMPARISON:  CT head 11/10/2016 FINDINGS: Brain: Generalized atrophy. Negative for hydrocephalus. Negative for acute infarct. Scattered small white matter hyperintensities consistent with chronic microvascular ischemia. Mild chronic ischemia in the central pons. Negative for hemorrhage or mass. No shift of the midline structures. Vascular: Normal arterial flow voids. Skull and upper cervical spine: Negative Sinuses/Orbits: Bilateral lens replacement. Paranasal sinuses clear. Other: None IMPRESSION: Atrophy and mild chronic microvascular ischemic change. No acute abnormality. Electronically Signed   By: Franchot Gallo M.D.   On: 11/11/2016 06:56   Dg Chest Port 1 View  Result Date: 11/11/2016 CLINICAL DATA:  Fever.  Possible code stroke . EXAM: PORTABLE CHEST 1 VIEW COMPARISON:  09/06/2016 . FINDINGS: Mediastinum and hilar structures normal. Stable mild cardiomegaly. Mild bilateral pulmonary interstitial prominence. Pneumonitis cannot be excluded. No pleural effusion or pneumothorax. ORIF left clavicle again noted. Left shoulder replacement again noted. Prior thoracolumbar spine fusion. Prior cholecystectomy. IMPRESSION: Mild bilateral pulmonary interstitial prominence consistent with mild pneumonitis. Electronically Signed   By: Marcello Moores  Register   On: 11/11/2016 12:08   Dg Abd Portable 2v  Result Date: 11/11/2016 CLINICAL DATA:  Vomiting EXAM: PORTABLE ABDOMEN - 2 VIEW COMPARISON:  09/08/2014 FINDINGS: Streaky atelectasis or possible scarring at the left lung base. Stable mild elevation of the right diaphragm. No free air beneath the diaphragm. Surgical clips in the right upper  quadrant. Overall nonobstructed bowel-gas pattern with mild fecal impaction in the rectum. Surgical coils over the right abdomen. Similar appearance of posterior spinal stabilization rods and fixating screws. IMPRESSION: Nonobstructed bowel gas pattern with mild fecal impaction in the rectum. Electronically Signed   By: Donavan Foil M.D.   On: 11/11/2016 18:41   Ct Head Code Stroke W/o Cm  Addendum Date: 11/10/2016   ADDENDUM REPORT: 11/10/2016 22:21 ADDENDUM: These results were called by telephone at the time of interpretation on 11/10/2016 at 10:20 pm to Dr. Carmin Muskrat , who verbally acknowledged these results. Electronically Signed   By: Kristine Garbe M.D.   On: 11/10/2016 22:21   Result Date: 11/10/2016 CLINICAL DATA:  Code stroke.  Acute confusion and weakness. EXAM: CT HEAD WITHOUT CONTRAST TECHNIQUE: Contiguous axial images were obtained from the base  of the skull through the vertex without intravenous contrast. COMPARISON:  09/06/2016 CT of the head. FINDINGS: Brain: No evidence of acute infarction, hemorrhage, hydrocephalus, extra-axial collection or mass lesion/mass effect. Few stable nonspecific foci of white matter hypoattenuation are compatible with mild chronic microvascular ischemic changes. Mild brain parenchymal volume loss. Stable lucency in pons compatible with chronic lacunar infarct. Vascular: No hyperdense vessel. Calcific atherosclerosis of cavernous internal carotid arteries. Skull: Normal. Negative for fracture or focal lesion. Sinuses/Orbits: No acute finding. Other: None. ASPECTS [facility] Stroke Program Early CT Score) - Ganglionic level infarction (caudate, lentiform nuclei, internal capsule, insula, M1-M3 cortex): 7 - Supraganglionic infarction (M4-M6 cortex): 3 Total score (0-10 with 10 being normal): 10 IMPRESSION: 1. No acute intracranial abnormality. 2. Mild chronic microvascular ischemic changes and brain parenchymal volume loss. 3. ASPECTS is 10  Electronically Signed: By: Kristine Garbe M.D. On: 11/10/2016 22:13     Medications:  Scheduled: .  stroke: mapping our early stages of recovery book   Does not apply Once  . ampicillin-sulbactam (UNASYN) IV  1.5 g Intravenous Q6H  . aspirin  300 mg Rectal Daily   Or  . aspirin  325 mg Oral Daily  . atenolol  50 mg Oral q morning - 10a  . enoxaparin (LOVENOX) injection  40 mg Subcutaneous Daily  . folic acid  1 mg Oral Daily  . furosemide  20 mg Oral Q breakfast  . gabapentin  400 mg Oral 6 X Daily  . levETIRAcetam  500 mg Intravenous BID  . levothyroxine  75 mcg Oral QAC breakfast  . magnesium sulfate 1 - 4 g bolus IVPB  2 g Intravenous Once  . multivitamin with minerals  1 tablet Oral Daily  . pantoprazole  40 mg Oral Daily  . potassium chloride  40 mEq Oral Once  . simvastatin  10 mg Oral QHS  . thiamine  100 mg Oral Daily   Or  . thiamine  100 mg Intravenous Daily  . venlafaxine XR  75 mg Oral Q breakfast  . zolpidem  5 mg Oral QHS   Continuous:  [id number] **OR** acetaminophen, ALPRAZolam, hydrALAZINE, hydrOXYzine, hyoscyamine, lip balm, LORazepam, LORazepam **OR** LORazepam, multivitamin with minerals, ondansetron (ZOFRAN) IV, senna-docusate, tiZANidine  Assessment/Plan:  Principal Problem:   Acute encephalopathy Active Problems:   GERD (gastroesophageal reflux disease)   Anxiety   Hypokalemia   Chronic diastolic CHF (congestive heart failure) (HCC)   Hypothyroidism   Hypertensive urgency   Altered mental state   Seizure ([place])     Acute encephalopathy, Acute delirium/possible withdrawal Apparently, according to patient's family, she abuses xanax and consumes alcohol at home. It is unclear how much she drinks. I was told at the time of initial assessment that it was 2 glasses of wines per day, but apparently it's more than that. Etiology for delirium appears to be multifactorial including drug withdrawal, alcohol withdrawal, seizures. She also  has developed a fever. Chest x-ray done last night, suggesting pneumonitis. She has had a few episodes of vomiting and so she was started on Unasyn. However, chest x-ray this morning appears to be clear. CNS infection needs to be ruled out. An LP will be done today. CT head was unremarkable. MRI brain does not show any stroke or any other concerning findings. EEG is as above. Neurology is following  Fever See above. Could also be due to the withdrawal process. Blood cultures have been sent. LP is pending. Stop Unasyn and initiated ceftriaxone, vancomycin and ampicillin for now.  Proper precautions.   Seizures. No known history of same. Continue seizure precautions. Continue Keppra. Neurochecks. EEG is as above. MRI as mentioned above. Urology is following.  Nausea and vomiting Apparently had multiple episodes of emesis overnight. Abdominal film done last night does not show any obstruction. Evidence for mild fecal impaction. Abdomen is soft morning. Continue to monitor for now. Could be due to withdrawal process.  Hypertensive urgency Blood pressure remains high, but improved compared to the time of admission. Continue hydralazine as needed.   Dehydration with mildly elevated creatinine She has had poor oral intake over the last few days. Her mouth is dry. Creatinine has crept up. Initiate IV fluids.  Chronic systolic congestive heart failure 2-D echo 09/05/14 showed EF 60-65 percent with grade 1 diastolic dysfunction. See above. Mildly dehydrated today. Initiate IV fluids and monitor carefully.   Hyperlipidemia  Continuing home medications when able to take orally  GERD: Protonix  Depression and anxiety: Hold her home medications.  Hypokalemia and hypomagnesemia Replace potassium.  Hypothyroidism TSH is normal. Reinitiate Synthroid, when able to take orally.  DVT Prophylaxis: Lovenox   Code Status: Full code  Family Communication: No family at bedside. Tried calling husband  with no success. Disposition Plan: Management as outlined above.     LOS: 1 day   [place] [facility] Pager [phone] 11/12/2016, 9:00 AM  If 7PM-7AM, please contact night-coverage at [email or web address], password [facility]

## 2016-11-12 NOTE — Progress Notes (Signed)
Patient administered 650 mg Tylenol rectal for axillary temp of 102. Per Dr Nicole Kindred CXR, urine culture and blood culture ordered. Patient BP WDL following IV hydralazine administration. RN will continue to monitor.

## 2016-11-12 NOTE — Progress Notes (Signed)
Pharmacy Antibiotic Note  [name] [name] is a 80 y.o. female admitted on 11/10/2016 with questionable meningitis.  Pharmacy has been consulted for vancomycin and ampicillin dosing.  SCr has been trending up slowly since admission- currently 1.03 with eCrCl ~35-80mL/min.    Plan: -Ceftriaxone 2g IV q12h per MD (appropriate) -Vancomycin 1250mg  IV x1 as loading dose, then 1g IV q24h. Goal trough 15-18mcg/mL -Ampicillin 2g IV q6h -follow clinical progression, c/s, renal function, trough at SS   Height: 5\' 1"  (154.9 cm) Weight: 149 lb 4 oz (67.7 kg) IBW/kg (Calculated) : 47.8  Temp (24hrs), Avg:100.3 F (37.9 C), Min:98.2 F (36.8 C), Max:102.9 F (39.4 C)   Recent Labs Lab 11/10/16 2152 11/10/16 2158 11/11/16 1217 11/12/16 0530  WBC 7.9  --  9.6 14.3*  CREATININE 0.84 0.90 0.97 1.03*    Estimated Creatinine Clearance: 39.7 mL/min (by C-G formula based on SCr of 1.03 mg/dL (H)).    Allergies  Allergen Reactions  . Tetanus Toxoids Swelling  . Yellow Dyes (Non-Tartrazine) Itching and Other (See Comments)    Makes patient nervous     Antimicrobials this admission:  CTX 12/1 >>  Vanc 12/1 >>  Amp 12/1>>  Dose adjustments this admission:  N/a  Microbiology results:  11/30 BCx x2: sent 12/1 BCx x1: sent 12/1 UCx: sent  Thank you for allowing pharmacy to be a part of this patient's care.  [name] [name] D. Damarie Schoolfield, PharmD, BCPS Clinical Pharmacist Pager: [phone] 11/12/2016 11:38 AM

## 2016-11-12 NOTE — Progress Notes (Signed)
SLP Cancellation Note  Patient Details Name: [name] [name] MRN: [id number] DOB: [birth year]   Cancelled treatment:       Reason Eval/Treat Not Completed: Medical issues which prohibited therapy  Alinda Egolf B. Rutherford Nail, M.S., CCC-SLP Speech-Language Pathologist   Loral Campi 11/12/2016, 2:10 PM

## 2016-11-12 NOTE — Progress Notes (Signed)
Initial Nutrition Assessment   INTERVENTION:  Diet advancement per MD Recommend providing Boost Breeze and/or Ensure Enlive supplements 2-3 times daily when diet is advanced Multivitamin with minerals daily   NUTRITION DIAGNOSIS:   Predicted suboptimal nutrient intake related to lethargy/confusion, inability to eat as evidenced by NPO status.   GOAL:   Patient will meet greater than or equal to 90% of their needs   MONITOR:   Diet advancement, Supplement acceptance, PO intake, Skin, I & O's, Labs, Weight trends  REASON FOR ASSESSMENT:   Malnutrition Screening Tool    ASSESSMENT:   80 year old Caucasian female with a past medical history of hypertension, hyperlipidemia, GERD, hypothyroidism, history of stroke, presented from home with complaints of altered mental status and seizure activity.   Pt out of room at time of visit. Per malnutrition screening tool, pt has had unintentional weight loss and has been eating poorly due to a decreased appetite. There is no evidence of weight loss per weight history. Pt is currently NPO. SLP unable to evaluate today due to medical issues.  Nausea and vomiting; ossible alcohol withdrawal per MD note.   Labs: low potassium, low calcium   Diet Order:  Diet NPO time specified  Skin:  Reviewed, no issues  Last BM:  PTA  Height:   Ht Readings from Last 1 Encounters:  11/10/16 5\' 1"  (1.549 m)    Weight:   Wt Readings from Last 1 Encounters:  11/10/16 149 lb 4 oz (67.7 kg)    Ideal Body Weight:  47.7 kg  BMI:  Body mass index is 28.2 kg/m.  Estimated Nutritional Needs:   Kcal:  1500-1700  Protein:  70-80 grams  Fluid:  1.6-1.8 L/day  EDUCATION NEEDS:   No education needs identified at this time  Scarlette Ar RD, CSP, LDN Inpatient Clinical Dietitian Pager: [phone] After Hours Pager: [phone]

## 2016-11-12 NOTE — Progress Notes (Signed)
OT Cancellation Note  Patient Details Name: LAURIELLE PLOCK MRN: [id number] DOB: 23-Sep-1936   Cancelled Treatment:    Reason Eval/Treat Not Completed: Patient not medically ready Rn and rapid response at bedside evaluating patient at this time. Staff requesting OT / PT to hold evaluations. Pt to have LP at radiology later this afternoon  Vonita Moss   OTR/L Pager: [phone] Office: [phone] .  11/12/2016, 11:36 AM

## 2016-11-12 NOTE — Progress Notes (Signed)
PT Cancellation Note  Patient Details Name: MACADY TORBET MRN: [id number] DOB: 09/15/1936   Cancelled Treatment:    Reason Eval/Treat Not Completed: Medical issues which prohibited therapy   Per notes, pt with frequent seizures this morning and not following commands. To have further diagnostic testing. Will attempt evaluation 12/2, if appropriate.   Jeanie Cooks Naphtali Zywicki 11/12/2016, 12:31 PM Pager [phone]

## 2016-11-12 NOTE — Progress Notes (Signed)
RRT follow up visit - patient sleeping soundly from procedure - arouses to name - face flushed - follows commands - oriented to person and place - calm and co-op - slight tremors noted but no agitation -pleasant - conversing with daughter.  Feels hot to touch - rectal temp done - 99.3.  Bp 170/65 RR 20 O2 sats 97% on RA.  Available as needed.  Handoff to [employer].

## 2016-11-13 ENCOUNTER — Encounter (HOSPITAL_COMMUNITY): Payer: Self-pay | Admitting: *Deleted

## 2016-11-13 DIAGNOSIS — F10231 Alcohol dependence with withdrawal delirium: Secondary | ICD-10-CM

## 2016-11-13 DIAGNOSIS — I679 Cerebrovascular disease, unspecified: Secondary | ICD-10-CM

## 2016-11-13 DIAGNOSIS — R112 Nausea with vomiting, unspecified: Secondary | ICD-10-CM

## 2016-11-13 DIAGNOSIS — R569 Unspecified convulsions: Secondary | ICD-10-CM

## 2016-11-13 DIAGNOSIS — I499 Cardiac arrhythmia, unspecified: Secondary | ICD-10-CM

## 2016-11-13 DIAGNOSIS — F10931 Alcohol use, unspecified with withdrawal delirium: Secondary | ICD-10-CM

## 2016-11-13 HISTORY — DX: Unspecified convulsions: R56.9

## 2016-11-13 HISTORY — DX: Cardiac arrhythmia, unspecified: I49.9

## 2016-11-13 LAB — CBC
HCT: 33.8 % — ABNORMAL LOW (ref 36.0–46.0)
Hemoglobin: 10.7 g/dL — ABNORMAL LOW (ref 12.0–15.0)
MCH: 29.6 pg (ref 26.0–34.0)
MCHC: 31.7 g/dL (ref 30.0–36.0)
MCV: 93.6 fL (ref 78.0–100.0)
PLATELETS: 170 10*3/uL (ref 150–400)
RBC: 3.61 MIL/uL — AB (ref 3.87–5.11)
RDW: 14.9 % (ref 11.5–15.5)
WBC: 9.4 10*3/uL (ref 4.0–10.5)

## 2016-11-13 LAB — COMPREHENSIVE METABOLIC PANEL
ALBUMIN: 2.5 g/dL — AB (ref 3.5–5.0)
ALT: 15 U/L (ref 14–54)
AST: 18 U/L (ref 15–41)
Alkaline Phosphatase: 68 U/L (ref 38–126)
Anion gap: 11 (ref 5–15)
BUN: 13 mg/dL (ref 6–20)
CHLORIDE: 115 mmol/L — AB (ref 101–111)
CO2: 22 mmol/L (ref 22–32)
CREATININE: 0.89 mg/dL (ref 0.44–1.00)
Calcium: 8.1 mg/dL — ABNORMAL LOW (ref 8.9–10.3)
GFR calc Af Amer: >60 mL/min (ref >60)
GLUCOSE: 107 mg/dL — AB (ref 65–99)
POTASSIUM: 3.1 mmol/L — AB (ref 3.5–5.1)
SODIUM: 148 mmol/L — AB (ref 135–145)
Total Bilirubin: 0.6 mg/dL (ref 0.3–1.2)
Total Protein: 5 g/dL — ABNORMAL LOW (ref 6.5–8.1)

## 2016-11-13 LAB — URINE CULTURE: CULTURE: NO GROWTH

## 2016-11-13 LAB — GLUCOSE, CAPILLARY: Glucose-Capillary: 114 mg/dL — ABNORMAL HIGH (ref 65–99)

## 2016-11-13 LAB — MAGNESIUM: MAGNESIUM: 2.3 mg/dL (ref 1.7–2.4)

## 2016-11-13 MED ORDER — METOPROLOL TARTRATE 5 MG/5ML IV SOLN
5.0000 mg | Freq: Once | INTRAVENOUS | Status: AC
  Administered 2016-11-13: 5 mg via INTRAVENOUS
  Filled 2016-11-13: qty 5

## 2016-11-13 MED ORDER — POTASSIUM CHLORIDE CRYS ER 20 MEQ PO TBCR
40.0000 meq | EXTENDED_RELEASE_TABLET | Freq: Once | ORAL | Status: AC
  Administered 2016-11-13: 40 meq via ORAL
  Filled 2016-11-13: qty 2

## 2016-11-13 MED ORDER — SODIUM CHLORIDE 0.9 % IV SOLN
30.0000 meq | Freq: Once | INTRAVENOUS | Status: DC
  Filled 2016-11-13: qty 15

## 2016-11-13 MED ORDER — LEVOTHYROXINE SODIUM 75 MCG PO TABS
75.0000 ug | ORAL_TABLET | Freq: Every day | ORAL | Status: DC
  Administered 2016-11-14 – 2016-11-18 (×5): 75 ug via ORAL
  Filled 2016-11-13 (×5): qty 1

## 2016-11-13 MED ORDER — ATENOLOL 25 MG PO TABS
50.0000 mg | ORAL_TABLET | Freq: Every morning | ORAL | Status: DC
  Administered 2016-11-13 – 2016-11-14 (×2): 50 mg via ORAL
  Filled 2016-11-13 (×2): qty 2

## 2016-11-13 MED ORDER — SODIUM CHLORIDE 0.9% FLUSH
10.0000 mL | INTRAVENOUS | Status: DC | PRN
  Administered 2016-11-14: 10 mL
  Filled 2016-11-13: qty 40

## 2016-11-13 MED ORDER — SODIUM CHLORIDE 0.9 % IV SOLN
30.0000 meq | Freq: Once | INTRAVENOUS | Status: AC
  Administered 2016-11-13: 30 meq via INTRAVENOUS
  Filled 2016-11-13: qty 15

## 2016-11-13 MED ORDER — DILTIAZEM HCL 30 MG PO TABS
60.0000 mg | ORAL_TABLET | Freq: Four times a day (QID) | ORAL | Status: DC
  Administered 2016-11-13 – 2016-11-14 (×3): 60 mg via ORAL
  Filled 2016-11-13 (×3): qty 2

## 2016-11-13 MED ORDER — PANTOPRAZOLE SODIUM 40 MG PO TBEC
40.0000 mg | DELAYED_RELEASE_TABLET | Freq: Every day | ORAL | Status: DC
  Administered 2016-11-13 – 2016-11-18 (×5): 40 mg via ORAL
  Filled 2016-11-13 (×6): qty 1

## 2016-11-13 MED ORDER — KCL IN DEXTROSE-NACL 20-5-0.45 MEQ/L-%-% IV SOLN
INTRAVENOUS | Status: DC
  Administered 2016-11-13 – 2016-11-16 (×3): via INTRAVENOUS
  Filled 2016-11-13 (×4): qty 1000

## 2016-11-13 MED ORDER — DEXTROSE 5 % IV SOLN
10.0000 mg/kg | Freq: Three times a day (TID) | INTRAVENOUS | Status: DC
  Administered 2016-11-13 – 2016-11-15 (×7): 675 mg via INTRAVENOUS
  Filled 2016-11-13 (×11): qty 13.5

## 2016-11-13 NOTE — Evaluation (Signed)
Clinical/Bedside Swallow Evaluation Patient Details  Name: [name] [name] MRN: [id number] Date of Birth: 1936-10-23  Today's Date: 11/13/2016 Time: SLP Start Time (ACUTE ONLY): [id number] SLP Stop Time (ACUTE ONLY): 1146 SLP Time Calculation (min) (ACUTE ONLY): 15 min  Past Medical History:  Past Medical History:  Diagnosis Date  . Anemia    hx of years ago   . Anxiety   . ARF (acute renal failure) ([place]) 08/23/2014  . Arthritis    psoriatic arthritis  . Back pain   . Blood transfusion    at age of 4  . Chronic diastolic CHF (congestive heart failure) ([place])   . Depression   . GERD (gastroesophageal reflux disease)   . H/O hiatal hernia   . Hyperlipidemia   . Hypertension   . Hypothyroidism   . IBS (irritable bowel syndrome)   . Pancreatitis   . PONV (postoperative nausea and vomiting)   . Thyroid disease    Past Surgical History:  Past Surgical History:  Procedure Laterality Date  . ABDOMINAL HYSTERECTOMY     partial  . BACK SURGERY  2010   thoractic-screws and rods  . BILE DUCT STENT PLACEMENT  08/26/2014  . BREAST REDUCTION SURGERY    . CHOLECYSTECTOMY  1988   lap  . COLONOSCOPY    . COLONOSCOPY Left 09/08/2014   Procedure: COLONOSCOPY;  Surgeon: Arta Silence, MD;  Location[facility] ENDOSCOPY;  Service: Endoscopy;  Laterality: Left;  . ERCP    . ERCP N/A 08/26/2014   Procedure: ENDOSCOPIC RETROGRADE CHOLANGIOPANCREATOGRAPHY (ERCP);  Surgeon: Jeryl Columbia, MD;  Location: [facility] ENDOSCOPY;  Service: Endoscopy;  Laterality: N/A;  . ERCP N/A 09/11/2014   Procedure: ENDOSCOPIC RETROGRADE CHOLANGIOPANCREATOGRAPHY (ERCP);  Surgeon: Arta Silence, MD;  Location: [facility] ENDOSCOPY;  Service: Endoscopy;  Laterality: N/A;  . EUS N/A 09/11/2014   Procedure: ESOPHAGEAL ENDOSCOPIC ULTRASOUND (EUS) RADIAL;  Surgeon: Arta Silence, MD;  Location: [facility] ENDOSCOPY;  Service: Endoscopy;  Laterality: N/A;  . JOINT REPLACEMENT  2005   left shoulder replacement   . ORIF CLAVICULAR FRACTURE Left 07/18/2014   Procedure: OPEN REDUCTION INTERNAL FIXATION (ORIF) LEFT CLAVICULAR FRACTURE;  Surgeon: Ninetta Lights, MD;  Location: [place];  Service: Orthopedics;  Laterality: Left;  . SPYGLASS CHOLANGIOSCOPY N/A 09/11/2014   Procedure: SPYGLASS CHOLANGIOSCOPY;  Surgeon: Arta Silence, MD;  Location: [facility] ENDOSCOPY;  Service: Endoscopy;  Laterality: N/A;  . TONSILLECTOMY  as child  . TOTAL KNEE ARTHROPLASTY Left 03/10/2016   Procedure: LEFT TOTAL KNEE ARTHROPLASTY;  Surgeon: Ninetta Lights, MD;  Location: [place];  Service: Orthopedics;  Laterality: Left;  [name] VENTRAL HERNIA REPAIR  06/08/2012   Procedure: LAPAROSCOPIC VENTRAL HERNIA;  Surgeon: Adin Hector, MD;  Location: [facility] ORS;  Service: General;  Laterality: Right;   HPI:  [name] Cramblit Dixonis a 80 y.o.femalewith medical history significant of hypertension, hyperlipidemia, GERD, hypothyroidism, depression, PsyD, pancreatitis, stroke, dCHF, whopresents with altered mental status and seizure. MRI of brain without acute abnormalities.   Assessment / Plan / Recommendation Clinical Impression  Pts swallow appears within functional limits. No overt signs or symptoms of aspiration with any PO this date. Oral motor exam unremarkable. Pt will likely require some assistance during self feeding secondary to upper exremity tremors which she states having at baseline. Recommend regular diet and thin liquids no further swallow needs identified. ST to evaluate cognitive linguistic function.     Aspiration Risk  Mild aspiration risk    Diet Recommendation   Regular, thin liquids  Medication Administration: Whole meds with liquid    Other  Recommendations Oral Care Recommendations: Oral care BID   Follow up Recommendations 24 hour supervision/assistance      Frequency and Duration            Prognosis        Swallow Study   General Date of Onset: 11/10/16 HPI: [name] Keefauver Dixonis a 80 y.o.femalewith medical history significant of  hypertension, hyperlipidemia, GERD, hypothyroidism, depression, PsyD, pancreatitis, stroke, dCHF, whopresents with altered mental status and seizure. MRI of brain without acute abnormalities. Type of Study: Bedside Swallow Evaluation Previous Swallow Assessment: none on file Diet Prior to this Study: NPO Temperature Spikes Noted: Yes Respiratory Status: Room air History of Recent Intubation: No Behavior/Cognition: Alert;Cooperative Oral Cavity Assessment: Dry Oral Cavity - Dentition: Adequate natural dentition Self-Feeding Abilities: Able to feed self;Other (Comment) (upper extremity tremors, pt states having at basline ) Patient Positioning: Upright in bed Baseline Vocal Quality: Low vocal intensity Volitional Cough: Strong Volitional Swallow: Able to elicit    Oral/Motor/Sensory Function Overall Oral Motor/Sensory Function: Generalized oral weakness   Ice Chips Ice chips: Within functional limits   Thin Liquid Thin Liquid: Within functional limits    Nectar Thick Nectar Thick Liquid: Not tested   Honey Thick Honey Thick Liquid: Not tested   Puree Puree: Within functional limits   Solid   GO   Solid: Within functional limits       Arvil Chaco MA, CCC-SLP Acute Care Speech Language Pathologist    Levi Aland 11/13/2016,12:02 PM

## 2016-11-13 NOTE — Evaluation (Signed)
Speech Language Pathology Evaluation Patient Details Name: [name] [name] MRN: [id number] DOB: [birth year] Today's Date: 11/13/2016 Time: [id number] SLP Time Calculation (min) (ACUTE ONLY): 14 min  Problem List:  Patient Active Problem List   Diagnosis Date Noted  . Cerebrovascular disease   . Alcohol withdrawal delirium ([place])   . Febrile   . Acute encephalopathy 11/10/2016  . Hypothyroidism 11/10/2016  . Hypertensive urgency 11/10/2016  . Altered mental state 11/10/2016  . Seizure ([place]) 11/10/2016  . Chronic diastolic CHF (congestive heart failure) ([place])   . Left carotid stenosis 10/29/2016  . S/P total knee replacement 03/10/2016  . 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 Ileus ([place]) 09/05/2014  . Leukocytosis, unspecified 09/05/2014  . Vomiting 09/04/2014  . Pulmonary edema 09/04/2014  . Hypokalemia 08/28/2014  . Enteritis due to Clostridium difficile 08/27/2014  . 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 Protein-calorie malnutrition, severe ([place]) 08/24/2014  . Obstructive jaundice 08/23/2014  . Acute cholangitis 08/23/2014  . ARF (acute renal failure) ([place]) 08/23/2014  . Coagulopathy ([place]) 08/23/2014  . Back pain   . Arthritis   . Hypertension   . GERD (gastroesophageal reflux disease)   . Anxiety   . IBS (irritable bowel syndrome)   . Hernia of Right posterior flank 04/18/2012   Past Medical History:  Past Medical History:  Diagnosis Date  . Anemia    hx of years ago   . Anxiety   . ARF (acute renal failure) ([place]) 08/23/2014  . Arthritis    psoriatic arthritis  . Back pain   . Blood transfusion    at age of 4  . Chronic diastolic CHF (congestive heart failure) ([place])   . Depression   . GERD (gastroesophageal reflux disease)   . H/O hiatal hernia   . Hyperlipidemia   . Hypertension   . Hypothyroidism   . IBS (irritable bowel syndrome)   . Pancreatitis   . PONV (postoperative nausea and vomiting)   . Thyroid disease    Past Surgical History:   Past Surgical History:  Procedure Laterality Date  . ABDOMINAL HYSTERECTOMY     partial  . BACK SURGERY  2010   thoractic-screws and rods  . BILE DUCT STENT PLACEMENT  08/26/2014  . BREAST REDUCTION SURGERY    . CHOLECYSTECTOMY  1988   lap  . COLONOSCOPY    . COLONOSCOPY Left 09/08/2014   Procedure: COLONOSCOPY;  Surgeon: Arta Silence, MD;  Location[name] ENDOSCOPY;  Service: Endoscopy;  Laterality: Left;  . ERCP    . ERCP N/A 08/26/2014   Procedure: ENDOSCOPIC RETROGRADE CHOLANGIOPANCREATOGRAPHY (ERCP);  Surgeon: Jeryl Columbia, MD;  Location: [facility] ENDOSCOPY;  Service: Endoscopy;  Laterality: N/A;  . ERCP N/A 09/11/2014   Procedure: ENDOSCOPIC RETROGRADE CHOLANGIOPANCREATOGRAPHY (ERCP);  Surgeon: Arta Silence, MD;  Location: [facility] ENDOSCOPY;  Service: Endoscopy;  Laterality: N/A;  . EUS N/A 09/11/2014   Procedure: ESOPHAGEAL ENDOSCOPIC ULTRASOUND (EUS) RADIAL;  Surgeon: Arta Silence, MD;  Location: [facility] ENDOSCOPY;  Service: Endoscopy;  Laterality: N/A;  . JOINT REPLACEMENT  2005   left shoulder replacement   . ORIF CLAVICULAR FRACTURE Left 07/18/2014   Procedure: OPEN REDUCTION INTERNAL FIXATION (ORIF) LEFT CLAVICULAR FRACTURE;  Surgeon: Ninetta Lights, MD;  Location: [facility];  Service: Orthopedics;  Laterality: Left;  . SPYGLASS CHOLANGIOSCOPY N/A 09/11/2014   Procedure: SPYGLASS CHOLANGIOSCOPY;  Surgeon: Arta Silence, MD;  Location: [facility] ENDOSCOPY;  Service: Endoscopy;  Laterality: N/A;  . TONSILLECTOMY  as child  . TOTAL KNEE ARTHROPLASTY  Left 03/10/2016   Procedure: LEFT TOTAL KNEE ARTHROPLASTY;  Surgeon: Ninetta Lights, MD;  Location: [place];  Service: Orthopedics;  Laterality: Left;  [name] VENTRAL HERNIA REPAIR  06/08/2012   Procedure: LAPAROSCOPIC VENTRAL HERNIA;  Surgeon: Adin Hector, MD;  Location: [facility] ORS;  Service: General;  Laterality: Right;   HPI:  Pt is a 80 year old Caucasian female with a past medical history of hypertension, hyperlipidemia, GERD,  hypothyroidism, history of stroke, presented from home with complaints of altered mental status and seizure activity. History was provided by the patient's husband. She does not have any known history of seizure disorder. Patient was found to have elevated blood pressure. She was seen by neurology. Hospitalized for further management. Patient became more encephalopathic. She had more episodes of seizures and became febrile. According to patient's family, she abuses xanax and consumes alcohol at home. It is unclear how much she drinks. I was told at the time of initial assessment that it was 2 glasses of wines per day, but apparently it's more than that. Etiology for delirium appears to be multifactorial including drug withdrawal, alcohol withdrawal, seizures.   Assessment / Plan / Recommendation Clinical Impression  Pt presents with mild to moderate cognitive deficits unclear if worsening from baseline, no family or caregiver at bedside. Note MRI of brain negative for acute intracranial abnormality. Per MD notes family reports xanax abuse and alcohol consumption possibly attributing to cognitive decline. Pt affirms multiple falls at home over the past few months. Informal cognitive assessment reveals reduced thought organization, mild temporal disorientation, decreased novel recall, decreased executive functioning skills, and slowed information processing. PLOF pt states living with spouse and independenlty managing medicines and other complex ADL tasks. Concern for continued fall risk and interference with complex daily living tasks including medicine mismanagment. Recommend 24 hour care and SLP follow up to aid in improved cognitive functioning/ recommendations for care needs post DC.      SLP Assessment  Patient needs continued Speech Lanaguage Pathology Services    Follow Up Recommendations  24 hour supervision/assistance    Frequency and Duration min 1 x/week  1 week      SLP  Evaluation Cognition  Overall Cognitive Status: Difficult to assess Orientation Level: Disoriented to time;Oriented to person;Oriented to place;Oriented to situation Memory: Impaired Memory Impairment: Decreased recall of new information Problem Solving: Impaired Problem Solving Impairment: Functional complex Executive Function: Self Monitoring;Organizing;Sequencing Sequencing: Impaired Organizing: Impaired Self Monitoring: Impaired       Comprehension  Auditory Comprehension Overall Auditory Comprehension: Appears within functional limits for tasks assessed Yes/No Questions: Within Functional Limits    Expression Expression Primary Mode of Expression: Verbal Verbal Expression Overall Verbal Expression: Appears within functional limits for tasks assessed Written Expression Dominant Hand: Right   Oral / Motor  Oral Motor/Sensory Function Overall Oral Motor/Sensory Function: Generalized oral weakness Motor Speech Overall Motor Speech: Appears within functional limits for tasks assessed   GO                   Arvil Chaco MA, CCC-SLP Acute Care Speech Language Pathologist    Levi Aland 11/13/2016, 12:18 PM

## 2016-11-13 NOTE — Progress Notes (Signed)
Neurology Progress Note  Subjective: Patient is more after waking her up this morning. She remains confused, however. She denies any headache or neck stiffness. She is thirsty and wants something to drink. She is mildly tremulous. She denies any other problems on 12-pt ROS. LP was performed yesterday and showed a mild pleocytosis with neutrophilic predominance. She has since been placed on empiric abx vs meningitis including ceftriaxone, vancomycin, and ampicillin. She had not had any further seizure activity reported. She has been afebrile over the past 24 hours.  Pertinent meds: Ampicillin 2 g IV q6h Ceftriaxone 2 g IV q12h Vancomycin 1000 mg q24h Keppra 500 mg q12h Lorazepam PRN, last received 1 mg 12/1 0210  Current Meds:   Current Facility-Administered Medications:  .   stroke: mapping our early stages of recovery book, , Does not apply, Once, Ivor Costa, MD .  0.9 %  sodium chloride infusion, , Intravenous, Continuous, Bonnielee Haff, MD, Last Rate: 100 mL/hr at 11/12/16 0915 .  acetaminophen (TYLENOL) tablet 650 mg, 650 mg, Oral, Q4H PRN, 650 mg at 11/11/16 0247 **OR** acetaminophen (TYLENOL) suppository 650 mg, 650 mg, Rectal, Q4H PRN, Ivor Costa, MD, 650 mg at 11/12/16 [id number] .  ampicillin (OMNIPEN) 2 g in sodium chloride 0.9 % 50 mL IVPB, 2 g, Intravenous, Q6H, Lauren D Bajbus, RPH, 2 g at 11/13/16 0533 .  aspirin suppository 300 mg, 300 mg, Rectal, Daily, 300 mg at 11/12/16 1100 **OR** aspirin tablet 325 mg, 325 mg, Oral, Daily, Ivor Costa, MD, Stopped at 11/11/16 1136 .  cefTRIAXone (ROCEPHIN) 2 g in dextrose 5 % 50 mL IVPB, 2 g, Intravenous, Q12H, Bonnielee Haff, MD, 2 g at 11/12/16 2239 .  hydrALAZINE (APRESOLINE) injection 5 mg, 5 mg, Intravenous, Q2H PRN, Ivor Costa, MD, 5 mg at 11/12/16 0253 .  levETIRAcetam (KEPPRA) 500 mg in sodium chloride 0.9 % 100 mL IVPB, 500 mg, Intravenous, BID, Carmin Muskrat, MD, Last Rate: 420 mL/hr at 11/12/16 2239, 500 mg at 11/12/16 2239 .  lidocaine  (PF) (XYLOCAINE) 1 % injection 5 mL, 5 mL, Intradermal, Once, Bonnielee Haff, MD .  lip balm (BLISTEX) ointment, , Topical, PRN, Bonnielee Haff, MD .  LORazepam (ATIVAN) injection 1 mg, 1 mg, Intravenous, Q2H PRN, Ivor Costa, MD, 1 mg at 11/12/16 0210 .  LORazepam (ATIVAN) tablet 2 mg, 2 mg, Oral, Q6H PRN **OR** LORazepam (ATIVAN) injection 2 mg, 2 mg, Intravenous, Q6H PRN, Wallie Char .  ondansetron (ZOFRAN) injection 4 mg, 4 mg, Intravenous, Q6H PRN, Bonnielee Haff, MD, 4 mg at 11/12/16 0247 .  senna-docusate (Senokot-S) tablet 1 tablet, 1 tablet, Oral, QHS PRN, Ivor Costa, MD .  sodium phosphate (FLEET) 7-19 GM/118ML enema 1 enema, 1 enema, Rectal, Once, Bonnielee Haff, MD .  thiamine (VITAMIN B-1) tablet 100 mg, 100 mg, Oral, Daily **OR** thiamine (B-1) injection 100 mg, 100 mg, Intravenous, Daily, Wallie Char, 100 mg at 11/12/16 1108 .  vancomycin (VANCOCIN) IVPB 1000 mg/200 mL premix, 1,000 mg, Intravenous, Q24H, Lauren D Bajbus, RPH  Objective:  Temp:  [98 F (36.7 C)-99.7 F (37.6 C)] 99.3 F (37.4 C) (12/02 0653) Pulse Rate:  [81-98] 85 (12/02 0653) Resp:  [18-20] 18 (12/02 0653) BP: (156-188)/(65-141) 188/76 (12/02 0653) SpO2:  [93 %-98 %] 93 % (12/02 0653)  General: WDWN who was initially sleeping but who roused easily to voice. When alert she is oriented to self, month, day and date. Sustained attention is mildly impaired and she has some mild impulsivity and perseveration. Speech is clear without dysarthria.  Comportment is normal.  HEENT: Neck is supple without lymphadenopathy. Mucous membranes are slightly dry and the oropharynx is clear. Sclerae are anicteric. There is mild conjunctival injection.  CV: Regular, no murmur. Carotid pulses are 2+ and symmetric with no bruits. Distal pulses 2+ and symmetric.  Lungs: CTAB  Extremities: No C/C/E. Neuro: MS: As noted above. No aphasia.  CN: Pupils are equal and reactive from 4-->2 mm bilaterally. Visual fields appear full.  EOMI with breakup of smooth pursuits, no nystagmus. Facial sensation is intact to light touch. Face is symmetric at rest with normal strength and mobility. Hearing is intact to conversational voice. Voice is normal in tone and quality. Palate elevates symmetrically. Uvula is midline. Bilateral SCM and trapezii are 5/5. Tongue is midline with normal bulk and mobility.  Motor: Normal bulk, tone, and strength throughout. No pronator drift. She is mildly tremulous.   Sensation: Intact to light touch.  DTRs: Brisk 2+, symmetric. Toes are downgoing bilaterally. No pathological reflexes.  Coordination: Finger-to-nose is without dysmetria bilaterally.    Labs: Lab Results  Component Value Date   WBC 9.4 11/13/2016   HGB 10.7 (L) 11/13/2016   HCT 33.8 (L) 11/13/2016   PLT 170 11/13/2016   GLUCOSE 107 (H) 11/13/2016   CHOL 204 (H) 11/11/2016   TRIG 117 11/11/2016   HDL 61 11/11/2016   LDLCALC 120 (H) 11/11/2016   ALT 15 11/13/2016   AST 18 11/13/2016   NA 148 (H) 11/13/2016   K 3.1 (L) 11/13/2016   CL 115 (H) 11/13/2016   CREATININE 0.89 11/13/2016   BUN 13 11/13/2016   CO2 22 11/13/2016   TSH 1.125 11/11/2016   INR 1.01 11/10/2016   HGBA1C 5.4 11/11/2016   CBC Latest Ref Rng & Units 11/13/2016 11/12/2016 11/11/2016  WBC 4.0 - 10.5 K/uL 9.4 14.3(H) 9.6  Hemoglobin 12.0 - 15.0 g/dL 10.7(L) 11.9(L) 12.1  Hematocrit 36.0 - 46.0 % 33.8(L) 35.9(L) 36.0  Platelets 150 - 400 K/uL 170 232 226    Lab Results  Component Value Date   HGBA1C 5.4 11/11/2016   Lab Results  Component Value Date   ALT 15 11/13/2016   AST 18 11/13/2016   ALKPHOS 68 11/13/2016   BILITOT 0.6 11/13/2016   Mg 2.3  CSF wbc 18, 80% PMNs CSF rbc 17 CSF protein 62 CSF glucose 78 CSF gs negative CSF cx pending  Urine cx 11/12/16 pending Blood cx 12/1/17pending Blood cx 11/11/16 NGTD  Radiology:  There is no new neuroimaging for review   A/P:   1. Seizure: Presentation is most suggestive of possible  withdrawal (benzos +/- EtOH based on reports). LP showed a mild neutrophilic pleocytosis with slight elevation in protein and normal glucose. This profile is non-specific with potential explanations including post-seizure changes and early viral meningitis. It is not particularly consistent with bacterial meningitis with normal glucose and only slight elevations in WBCs and protein. It is reasonable, however, to continue empiric antibiotics until CSF cx proven negative. Continue CIWA protocol to cover possible withdrawal. Continue Keppra for now, switch to PO once able. Seizure precautions. Side effects and tolerability of Keppra were discussed with her, though her encephalopathy limits discussion.   2. Acute encephalopathy: Likely multifactorial with seizure, possible withdrawal. CSF shows slight pleocytosis and mild protein elevation, most suggestive of post-seizure changes or early viral meningitis. Doubt bacterial meningitis but will await cx and continue abx until this is resulted. She is better this morning, able to participate with the exam and answer some  questions. However, she remains pleasantly confused. Continue CIWA protocol. Minimize CNS active meds, particularly opiates and anything with strong anticholinergic properties. Benzos should be reserved for management of withdrawal symptoms or seizure lasting longer than two minutes. Continue to optimize metabolic status.   3. Cerebrovascular disease: Chronic, no acute issues. Continue risk factor modification, aspirin, statin.   4. Possible alcohol vs benzo withdrawal: Continue CIWA protocol.   This was discussed with the patient as much as her encephalopathy allows. No family was present.   [name], MD [facility]

## 2016-11-13 NOTE — Progress Notes (Signed)
Patient oriented x3-4 during shift (occasionaly disorientation with time or place). Slight tremors noted in patient at times. Patient appropriate with response, attention, questions, following commands. Continue to monitor for changes.

## 2016-11-13 NOTE — Progress Notes (Signed)
PT Cancellation Note  Patient Details Name: [name] [name] MRN: [id number] DOB: [birth year]   Cancelled Treatment:    Reason Eval/Treat Not Completed: Other (comment). Pt currently with bedrest orders in place. PT will continue to f/u with pt as activity level is adjusted, when pt is appropriate for PT evaluation.    Clearnce Sorrel Armstrong Creasy 11/13/2016, 8:29 AM Sherie Don, PT, DPT [phone]

## 2016-11-13 NOTE — Progress Notes (Addendum)
[facility] PROGRESS NOTE  PANTERA MICHELL [id number] DOB: 12-Jan-1936 DOA: 11/10/2016  PCP: Birdie Sons, MD (Inactive)  Brief History/Interval Summary: 80 year old Caucasian female with a past medical history of hypertension, hyperlipidemia, GERD, hypothyroidism, history of stroke, presented from home with complaints of altered mental status and seizure activity. History was provided by the patient's husband. She does not have any known history of seizure disorder. Patient was found to have elevated blood pressure. She was seen by neurology. Hospitalized for further management. Patient became more encephalopathic. She had more episodes of seizures and became febrile. Patient underwent lumbar puncture. CSF was inconclusive for infection.  Reason for Visit: Seizure  Consultants: Neurology  Procedures:  EEG "Impression: This is a mildly abnormal EEG due to mild intermixed slowing. This is suggestive of a mild encephalopathy but is nonspecific as to etiology. No evidence of seizure. Tremors are noted but have no electrical correlate. "  Lumbar puncture under fluoroscopy  Antibiotics: Started on Unasyn 11/30. Changed over to ceftriaxone, vancomycin and ampicillin 12/1 for presumed CNS infection. Acyclovir added 12/2  Subjective/Interval History: Patient's mental status has improved. She is answering some questions appropriately today. Still remains distracted.  ROS: Unable to do due to altered mental status  Objective:  Vital Signs  Vitals:   11/12/16 2252 11/13/16 0146 11/13/16 0200 11/13/16 0653  BP: (!) 169/72 (!) 170/70  (!) 188/76  Pulse:  88  85  Resp:  18  18  Temp:  99.7 F (37.6 C) 99.2 F (37.3 C) 99.3 F (37.4 C)  TempSrc:  Oral Oral Oral  SpO2:  93%  93%  Weight:      Height:        Intake/Output Summary (Last 24 hours) at 11/13/16 0846 Last data filed at 11/13/16 [id number]  Gross per 24 hour  Intake             2245 ml  Output               750 ml  Net             1495 ml   Filed Weights   11/10/16 2215  Weight: 67.7 kg (149 lb 4 oz)    General appearance: Awake, alert, Not as delirious as yesterday Resp: Diminished air entry at the bases. Crackles or wheezing. No rhonchi. Cardio: regular rate and rhythm, S1, S2 normal, no murmur, click, rub or gallop GI: Abdomen is soft, nondistended. Bowel sounds present. No masses or organomegaly otherwise. Extremities: extremities normal, atraumatic, no cyanosis or edema Neurologic: Awake, alert. Oriented to place, month. Cannot tell me the year. Could tell me the president's name. Off of restraints. Moving all her extremities. Continues to be tremulous.   Lab Results:  Data Reviewed: I have personally reviewed following labs and imaging studies  CBC:  Recent Labs Lab 11/10/16 2152 11/10/16 2158 11/11/16 1217 11/12/16 0530 11/13/16 0522  WBC 7.9  --  9.6 14.3* 9.4  NEUTROABS 5.4  --   --   --   --   HGB 12.6 12.2 12.1 11.9* 10.7*  HCT 37.7 36.0 36.0 35.9* 33.8*  MCV 89.1  --  88.2 89.8 93.6  PLT 182  --  226 232 [id number]    Basic Metabolic Panel:  Recent Labs Lab 11/10/16 2152 11/10/16 2158 11/11/16 0200 11/11/16 1217 11/12/16 0530 11/13/16 0522  NA 140 142  --  141 145 148*  K 3.1* 3.1*  --  3.0* 3.0* 3.1*  CL 105 104  --  104 108 115*  CO2 26  --   --  22 24 22   GLUCOSE 137* 132*  --  132* 146* 107*  BUN 14 17  --  13 11 13   CREATININE 0.84 0.90  --  0.97 1.03* 0.89  CALCIUM 8.6*  --   --  8.6* 8.7* 8.1*  MG  --   --  1.6*  --   --  2.3    GFR: Estimated Creatinine Clearance: 45.9 mL/min (by C-G formula based on SCr of 0.89 mg/dL).  Liver Function Tests:  Recent Labs Lab 11/10/16 2152 11/11/16 1217 11/12/16 0530 11/13/16 0522  AST 26 25 25 18   ALT 19 19 17 15   ALKPHOS 95 84 83 68  BILITOT 0.8 1.3* 0.9 0.6  PROT 6.3* 6.2* 6.3* 5.0*  ALBUMIN 3.3* 3.4* 3.3* 2.5*    Coagulation Profile:  Recent Labs Lab 11/10/16 2152 11/10/16 2348  INR 1.01  1.01    CBG:  Recent Labs Lab 11/10/16 2151 11/11/16 0813 11/12/16 0626 11/13/16 0658  GLUCAP 127* 145* 146* 114*    Lipid Profile:  Recent Labs  11/11/16 0202  CHOL 204*  HDL 61  LDLCALC 120*  TRIG 117  CHOLHDL 3.3    Thyroid Function Tests:  Recent Labs  11/11/16 0201  TSH 1.125     Radiology Studies: Dg Chest Port 1 View  Result Date: 11/12/2016 CLINICAL DATA:  Fever.  Confusion. EXAM: PORTABLE CHEST 1 VIEW COMPARISON:  11/11/2016 FINDINGS: Chronic cardiomegaly. Stable aortic and hilar contours. Low volume chest with interstitial crowding. No edema, effusion, or air bronchogram. Previous spinal, left glenohumeral, and left clavicle surgery with hardware. IMPRESSION: Limited low volume chest without acute or focal finding. Electronically Signed   By: Monte Fantasia M.D.   On: 11/12/2016 09:51   Dg Chest Port 1 View  Result Date: 11/11/2016 CLINICAL DATA:  Fever.  Possible code stroke . EXAM: PORTABLE CHEST 1 VIEW COMPARISON:  09/06/2016 . FINDINGS: Mediastinum and hilar structures normal. Stable mild cardiomegaly. Mild bilateral pulmonary interstitial prominence. Pneumonitis cannot be excluded. No pleural effusion or pneumothorax. ORIF left clavicle again noted. Left shoulder replacement again noted. Prior thoracolumbar spine fusion. Prior cholecystectomy. IMPRESSION: Mild bilateral pulmonary interstitial prominence consistent with mild pneumonitis. Electronically Signed   By: Marcello Moores  Register   On: 11/11/2016 12:08   Dg Abd Portable 2v  Result Date: 11/11/2016 CLINICAL DATA:  Vomiting EXAM: PORTABLE ABDOMEN - 2 VIEW COMPARISON:  09/08/2014 FINDINGS: Streaky atelectasis or possible scarring at the left lung base. Stable mild elevation of the right diaphragm. No free air beneath the diaphragm. Surgical clips in the right upper quadrant. Overall nonobstructed bowel-gas pattern with mild fecal impaction in the rectum. Surgical coils over the right abdomen. Similar  appearance of posterior spinal stabilization rods and fixating screws. IMPRESSION: Nonobstructed bowel gas pattern with mild fecal impaction in the rectum. Electronically Signed   By: Donavan Foil M.D.   On: 11/11/2016 18:41   Dg Fluoro Guide Lumbar Puncture  Result Date: 11/12/2016 CLINICAL DATA:  Acute encephalopathy in altered mental status. EXAM: DIAGNOSTIC LUMBAR PUNCTURE UNDER FLUOROSCOPIC GUIDANCE FLUOROSCOPY TIME:  Fluoroscopy Time:  0 minutes, 36 seconds Radiation Exposure Index (if provided by the fluoroscopic device): 2.8 mGy Number of Acquired Spot Images: 0 PROCEDURE: I discussed the risks (including hemorrhage, infection, headache, and nerve damage, among others), benefits, and alternatives to fluoroscopically guided lumbar puncture with the patient's husband, who is making her medical decisions at this time. We specifically discussed the high  technical likelihood of success of the procedure. Mr. Titterington understood and elected for the patient to undergo the procedure. Standard time-out was employed. Following sterile skin prep and local anesthetic administration consisting of 1 percent lidocaine, a 22 gauge spinal needle was advanced without difficulty into the thecal sac at the at the L2-3 level. Clear CSF was returned. Opening pressure was 18 cm of water. However, this estimate was made with the patient supine, as I did not feel it was safe to try to turn her on her side due to the altered mental status. 13 cc of CSF was collected. The first 2 both blood tinged, but this cleared on the subsequent tubes. The needle was subsequently removed and the skin cleansed and bandaged. No immediate complications were observed. IMPRESSION: 1. Technically successful fluoroscopically guided lumbar puncture, yielding 13 cc of CSF. No immediate complications observed. Electronically Signed   By: Van Clines M.D.   On: 11/12/2016 16:51     Medications:  Scheduled: .  stroke: mapping our early stages of  recovery book   Does not apply Once  . acyclovir  10 mg/kg Intravenous Q8H  . ampicillin (OMNIPEN) IV  2 g Intravenous Q6H  . aspirin  300 mg Rectal Daily   Or  . aspirin  325 mg Oral Daily  . cefTRIAXone (ROCEPHIN)  IV  2 g Intravenous Q12H  . levETIRAcetam  500 mg Intravenous BID  . lidocaine (PF)  5 mL Intradermal Once  . sodium phosphate  1 enema Rectal Once  . thiamine  100 mg Oral Daily   Or  . thiamine  100 mg Intravenous Daily  . vancomycin  1,000 mg Intravenous Q24H   Continuous: . sodium chloride 100 mL/hr at 11/12/16 0915   [id number] **OR** acetaminophen, hydrALAZINE, lip balm, LORazepam, LORazepam **OR** LORazepam, ondansetron (ZOFRAN) IV, senna-docusate  Assessment/Plan:  Principal Problem:   Acute encephalopathy Active Problems:   GERD (gastroesophageal reflux disease)   Anxiety   Hypokalemia   Chronic diastolic CHF (congestive heart failure) (HCC)   Hypothyroidism   Hypertensive urgency   Altered mental state   Seizure ([place])   Fever   Cerebrovascular disease   Alcohol withdrawal delirium ([place])     Acute encephalopathy, Acute delirium/possible withdrawal Apparently, according to patient's family, she abuses xanax and consumes alcohol at home. It is unclear how much she drinks. I was told that She drinks 2-4 glasses of wines per day, but apparently it's more than that. Etiology for delirium appears to be multifactorial including drug withdrawal, alcohol withdrawal, seizures. She also developed fever. Initial x-rays suggested pneumonitis. However, subsequent x-ray did not show any acute abnormalities. Mental status is much improved today. LP was done yesterday. Appreciate radiology assistance. CT head was unremarkable. MRI brain does not show any stroke or any other concerning findings. EEG is as above. Neurology is following  Fever See above. Could also be due to the withdrawal process. Blood cultures have been sent. LP was done yesterday. CSF does  show few WBCs. Glucose is normal. Protein is mildly elevated. Not conclusive for bacterial meningitis. Could be suggestive of viral meningitis. Gram stain did not show any organisms. Continue ceftriaxone, vancomycin and ampicillin for now. Add acyclovir. HSV PCR is pending. Cultures are pending. VDRL is pending. Continue droplet precautions.   Seizures. No known history of same. Hasn't had any further seizures in the last 24 hours Continue seizure precautions. Continue Keppra. Neurochecks. EEG is as above. MRI as mentioned above. Neurology is following.  Nausea  and vomiting No episodes of vomiting since yesterday. Abdominal film done last night does not show any obstruction. Evidence for mild fecal impaction. Abdomen remains benign. Continue to monitor for now.  Hypertensive urgency Blood pressure remains high, but improved compared to the time of admission. Continue hydralazine as needed. We'll reinitiate oral medications when she is able to take orally.  Dehydration with mildly elevated creatinine She has had poor oral intake over the last few days. Creatinine is improved this morning. Continue gentle IV hydration.   Chronic systolic congestive heart failure 2-D echo 09/05/14 showed EF 60-65 percent with grade 1 diastolic dysfunction.   Hyperlipidemia  Continuing home medications when able to take orally  GERD: Protonix  Depression and anxiety: Hold her home medications.  Hypokalemia and hypomagnesemia Replace potassium.  Hypothyroidism TSH is normal. Reinitiate Synthroid, when able to take orally.  ADDENDUM Called by Rn for tachycardia. Tele shows Afib. Patient asymptomatic. No previous history of afib per patient or husband. Will get EKG. Metoprolol Iv x1. She received Atenolol earlier today. Cardizem orally. If HR worsens and if she needs IV infusion, will transfer to stepdown or cardiac tele. No previous history of stroke. Will get ECHO. TSH was normal. Will need to discuss  anticoagulation once more stable.  DVT Prophylaxis: Lovenox   Code Status: Full code  Family Communication: No family at bedside. Tried calling husband with no success. Disposition Plan: Management as outlined above. Await further improvement. PT/OT evaluation is pending.    LOS: 2 days   [facility] Pager [phone] 11/13/2016, 8:46 AM  If 7PM-7AM, please contact night-coverage at [email or web address], password [facility]

## 2016-11-13 NOTE — Progress Notes (Addendum)
Patient converted to afib this afternoon with HR as high in 160s, MD aware by dayshift RN, orders were changed.  At beginning of shift, CCMD called stating patient back in NSR, HR 71. 2150 patient still in NSR with HR in 60s. Patient continues answering most questions appropriately. Continue to monitor patient.

## 2016-11-14 ENCOUNTER — Encounter (HOSPITAL_COMMUNITY): Payer: Self-pay | Admitting: *Deleted

## 2016-11-14 ENCOUNTER — Inpatient Hospital Stay (HOSPITAL_COMMUNITY): Payer: Medicare Other

## 2016-11-14 DIAGNOSIS — I4891 Unspecified atrial fibrillation: Secondary | ICD-10-CM

## 2016-11-14 DIAGNOSIS — I480 Paroxysmal atrial fibrillation: Secondary | ICD-10-CM

## 2016-11-14 LAB — CBC
HCT: 33.3 % — ABNORMAL LOW (ref 36.0–46.0)
Hemoglobin: 10.9 g/dL — ABNORMAL LOW (ref 12.0–15.0)
MCH: 29.9 pg (ref 26.0–34.0)
MCHC: 32.7 g/dL (ref 30.0–36.0)
MCV: 91.2 fL (ref 78.0–100.0)
PLATELETS: 201 10*3/uL (ref 150–400)
RBC: 3.65 MIL/uL — ABNORMAL LOW (ref 3.87–5.11)
RDW: 14.2 % (ref 11.5–15.5)
WBC: 11.7 10*3/uL — AB (ref 4.0–10.5)

## 2016-11-14 LAB — BASIC METABOLIC PANEL
ANION GAP: 8 (ref 5–15)
BUN: 11 mg/dL (ref 6–20)
CALCIUM: 8.2 mg/dL — AB (ref 8.9–10.3)
CO2: 22 mmol/L (ref 22–32)
CREATININE: 0.86 mg/dL (ref 0.44–1.00)
Chloride: 110 mmol/L (ref 101–111)
Glucose, Bld: 130 mg/dL — ABNORMAL HIGH (ref 65–99)
Potassium: 3.4 mmol/L — ABNORMAL LOW (ref 3.5–5.1)
SODIUM: 140 mmol/L (ref 135–145)

## 2016-11-14 LAB — ECHOCARDIOGRAM COMPLETE
HEIGHTINCHES: 61 in
WEIGHTICAEL: 2388.02 [oz_av]

## 2016-11-14 LAB — HERPES SIMPLEX VIRUS(HSV) DNA BY PCR
HSV 1 DNA: NEGATIVE
HSV 2 DNA: NEGATIVE

## 2016-11-14 LAB — GLUCOSE, CAPILLARY: Glucose-Capillary: 127 mg/dL — ABNORMAL HIGH (ref 65–99)

## 2016-11-14 MED ORDER — SACCHAROMYCES BOULARDII 250 MG PO CAPS
250.0000 mg | ORAL_CAPSULE | Freq: Two times a day (BID) | ORAL | Status: DC
  Administered 2016-11-14 – 2016-11-18 (×8): 250 mg via ORAL
  Filled 2016-11-14 (×8): qty 1

## 2016-11-14 MED ORDER — LEVETIRACETAM 500 MG PO TABS
500.0000 mg | ORAL_TABLET | Freq: Two times a day (BID) | ORAL | Status: DC
  Administered 2016-11-14 – 2016-11-16 (×4): 500 mg via ORAL
  Filled 2016-11-14 (×5): qty 1

## 2016-11-14 MED ORDER — DILTIAZEM HCL ER COATED BEADS 180 MG PO CP24
180.0000 mg | ORAL_CAPSULE | Freq: Every day | ORAL | Status: DC
  Administered 2016-11-14 – 2016-11-18 (×4): 180 mg via ORAL
  Filled 2016-11-14 (×5): qty 1

## 2016-11-14 MED ORDER — POTASSIUM CHLORIDE CRYS ER 20 MEQ PO TBCR
40.0000 meq | EXTENDED_RELEASE_TABLET | Freq: Once | ORAL | Status: AC
  Administered 2016-11-14: 40 meq via ORAL
  Filled 2016-11-14: qty 2

## 2016-11-14 NOTE — Progress Notes (Signed)
  Echocardiogram 2D Echocardiogram has been performed.  [name] [name] 11/14/2016, 10:17 AM

## 2016-11-14 NOTE — Progress Notes (Signed)
Neurology Progress Note  Subjective: Chart reviewed. She went into afib/flutter yesterday afternoon with HR into the 160s. She converted to sinus rhythm with diltiazem. She has remained more alert and per RN notes has been able to answer most questions appropriately. On my exam today, she has no specific complaints on 14-pt ROS. She is alert. RN reports that she has not eaten much yesterday; patient reports that she is not hungry and she doesn't want chicken. She seemed appropriate through most of the encounter but then asked me, "Do you love Jesus?" She then proceeded to tell me that she is ready to be with Jesus and that she thinks the he is coming soon. She says that she knows Edmonia Lynch is here and points to her SCD pump. She believes that Edmonia Lynch is using her pump to torture her by shooting water into her vagina. She became slightly paranoid and evasive at this point, unable to be convinced that the pump is not sinister in any way.   No seizure activity has been reported.   Pertinent meds: Acyclovir 675 mg q8h Ampicillin 2 g IV q6h Ceftriaxone 2 g IV q12h Vancomycin 1000 mg q24h Keppra 500 mg q12h Lorazepam PRN, last received 1 mg 12/1 0210, since d/c'd  Current Meds:   Current Facility-Administered Medications:  .   stroke: mapping our early stages of recovery book, , Does not apply, Once, Ivor Costa, MD .  acetaminophen (TYLENOL) tablet 650 mg, 650 mg, Oral, Q4H PRN, 650 mg at 11/14/16 0217 **OR** acetaminophen (TYLENOL) suppository 650 mg, 650 mg, Rectal, Q4H PRN, Ivor Costa, MD, 650 mg at 11/12/16 [id number] .  acyclovir (ZOVIRAX) 675 mg in dextrose 5 % 100 mL IVPB, 10 mg/kg, Intravenous, Q8H, Bonnielee Haff, MD, 675 mg at 11/14/16 0550 .  ampicillin (OMNIPEN) 2 g in sodium chloride 0.9 % 50 mL IVPB, 2 g, Intravenous, Q6H, Lauren D Bajbus, RPH, 2 g at 11/14/16 0522 .  [DISCONTINUED] aspirin suppository 300 mg, 300 mg, Rectal, Daily, 300 mg at 11/12/16 1100 **OR** aspirin tablet 325 mg, 325 mg, Oral,  Daily, Ivor Costa, MD, 325 mg at 11/13/16 1211 .  atenolol (TENORMIN) tablet 50 mg, 50 mg, Oral, q morning - 10a, Bonnielee Haff, MD, 50 mg at 11/13/16 1318 .  cefTRIAXone (ROCEPHIN) 2 g in dextrose 5 % 50 mL IVPB, 2 g, Intravenous, Q12H, Bonnielee Haff, MD, 2 g at 11/13/16 2209 .  dextrose 5 % and 0.45 % NaCl with KCl 20 mEq/L infusion, , Intravenous, Continuous, Bonnielee Haff, MD, Last Rate: 75 mL/hr at 11/13/16 0922 .  diltiazem (CARDIZEM) tablet 60 mg, 60 mg, Oral, Q6H, Bonnielee Haff, MD, 60 mg at 11/14/16 0522 .  hydrALAZINE (APRESOLINE) injection 5 mg, 5 mg, Intravenous, Q2H PRN, Ivor Costa, MD, 5 mg at 11/14/16 0534 .  levETIRAcetam (KEPPRA) 500 mg in sodium chloride 0.9 % 100 mL IVPB, 500 mg, Intravenous, BID, Carmin Muskrat, MD, Last Rate: 420 mL/hr at 11/13/16 2027, 500 mg at 11/13/16 2027 .  levothyroxine (SYNTHROID, LEVOTHROID) tablet 75 mcg, 75 mcg, Oral, QAC breakfast, Bonnielee Haff, MD, 75 mcg at 11/14/16 0803 .  lidocaine (PF) (XYLOCAINE) 1 % injection 5 mL, 5 mL, Intradermal, Once, Bonnielee Haff, MD .  lip balm (BLISTEX) ointment, , Topical, PRN, Bonnielee Haff, MD .  LORazepam (ATIVAN) injection 1 mg, 1 mg, Intravenous, Q2H PRN, Ivor Costa, MD, 1 mg at 11/12/16 0210 .  LORazepam (ATIVAN) tablet 2 mg, 2 mg, Oral, Q6H PRN **OR** LORazepam (ATIVAN) injection 2 mg, 2 mg,  Intravenous, Q6H PRN, Wallie Char .  ondansetron (ZOFRAN) injection 4 mg, 4 mg, Intravenous, Q6H PRN, Bonnielee Haff, MD, 4 mg at 11/14/16 0342 .  pantoprazole (PROTONIX) EC tablet 40 mg, 40 mg, Oral, Daily, Bonnielee Haff, MD, 40 mg at 11/13/16 1319 .  senna-docusate (Senokot-S) tablet 1 tablet, 1 tablet, Oral, QHS PRN, Ivor Costa, MD .  sodium chloride flush (NS) 0.9 % injection 10-40 mL, 10-40 mL, Intracatheter, PRN, Bonnielee Haff, MD, 10 mL at 11/14/16 0447 .  sodium phosphate (FLEET) 7-19 GM/118ML enema 1 enema, 1 enema, Rectal, Once, Bonnielee Haff, MD .  thiamine (VITAMIN B-1) tablet 100 mg, 100 mg, Oral,  Daily, 100 mg at 11/13/16 1210 **OR** [DISCONTINUED] thiamine (B-1) injection 100 mg, 100 mg, Intravenous, Daily, Wallie Char, 100 mg at 11/12/16 1108 .  vancomycin (VANCOCIN) IVPB 1000 mg/200 mL premix, 1,000 mg, Intravenous, Q24H, Lauren D Bajbus, RPH, 1,000 mg at 11/13/16 1404  Objective:  Temp:  [98.4 F (36.9 C)-99.3 F (37.4 C)] 98.8 F (37.1 C) (12/03 0522) Pulse Rate:  [63-160] 65 (12/03 0522) Resp:  [16-20] 18 (12/03 0134) BP: (128-194)/(53-87) 158/55 (12/03 0556) SpO2:  [95 %-98 %] 95 % (12/03 0522)  General: WDWN who is awake and alert. She was initially appropriate, able to answer questions and oriented to all but the year (said date was the 3rd or 4th).  Sustained attention is better today, still with a little impulsivity. She is delusional and a little bit paranoid as noted in subjective above. No overt evidence of AH or VH. She is not attending to internal stimuli during my visit. Comportment is normal.  HEENT: Neck is supple without lymphadenopathy. Mucous membranes are slightly dry and the oropharynx is clear. Sclerae are anicteric. There is mild conjunctival injection.  CV: Regular, no murmur. Carotid pulses are 2+ and symmetric with no bruits. Distal pulses 2+ and symmetric.  Lungs: CTAB  Extremities: No C/C/E. Neuro: MS: As noted above. No aphasia.  CN: Pupils are equal and reactive from 4-->2 mm bilaterally. Visual fields appear full. EOMI with breakup of smooth pursuits, no nystagmus. Facial sensation is intact to light touch. Face is symmetric at rest with normal strength and mobility. Hearing is intact to conversational voice. Voice is normal in tone and quality. Palate elevates symmetrically. Uvula is midline. Bilateral SCM and trapezii are 5/5. Tongue is midline with normal bulk and mobility.  Motor: Normal bulk, tone, and strength throughout. No pronator drift. She is mildly tremulous.   Sensation: Intact to light touch.  DTRs: Brisk 2+, symmetric. Toes are  downgoing bilaterally. No pathological reflexes.  Coordination: Finger-to-nose is without dysmetria bilaterally.    Labs: Lab Results  Component Value Date   WBC 11.7 (H) 11/14/2016   HGB 10.9 (L) 11/14/2016   HCT 33.3 (L) 11/14/2016   PLT 201 11/14/2016   GLUCOSE 130 (H) 11/14/2016   CHOL 204 (H) 11/11/2016   TRIG 117 11/11/2016   HDL 61 11/11/2016   LDLCALC 120 (H) 11/11/2016   ALT 15 11/13/2016   AST 18 11/13/2016   NA 140 11/14/2016   K 3.4 (L) 11/14/2016   CL 110 11/14/2016   CREATININE 0.86 11/14/2016   BUN 11 11/14/2016   CO2 22 11/14/2016   TSH 1.125 11/11/2016   INR 1.01 11/10/2016   HGBA1C 5.4 11/11/2016   CBC Latest Ref Rng & Units 11/14/2016 11/13/2016 11/12/2016  WBC 4.0 - 10.5 K/uL 11.7(H) 9.4 14.3(H)  Hemoglobin 12.0 - 15.0 g/dL 10.9(L) 10.7(L) 11.9(L)  Hematocrit 36.0 -  46.0 % 33.3(L) 33.8(L) 35.9(L)  Platelets 150 - 400 K/uL 201 170 232    Lab Results  Component Value Date   HGBA1C 5.4 11/11/2016   Lab Results  Component Value Date   ALT 15 11/13/2016   AST 18 11/13/2016   ALKPHOS 68 11/13/2016   BILITOT 0.6 11/13/2016   Mg 2.3  CSF wbc 18, 80% PMNs CSF rbc 17 CSF protein 62 CSF glucose 78 CSF gs negative CSF cx NGTD CSF HSV PCR pending CSF VDRL pending  Urine cx 11/12/16 NGTD Blood cx 11/12/16 NG at 1 day Blood cx 11/11/16 NGTD  Radiology:  There is no new neuroimaging for review   A/P:   1. Seizure: Presentation is most suggestive of possible withdrawal (benzos +/- EtOH based on reports). Cannot exclude the possibility of aseptic meningitis or even early HSV encephalitis based upon CSF findings. Continue CIWA protocol to cover possible withdrawal. Continue Keppra for now. Seizure precautions. Side effects and tolerability of Keppra were discussed with her, She voices understanding. She is a delusional and paranoid this morning. I suspect this is due to her encephalopathy but may need to consider switching AEDs if this persists as Keppra  can rarely cause psychosis.   2. Acute encephalopathy: Likely multifactorial with seizure, possible withdrawal possible CNS infection. CSF shows slight pleocytosis and mild protein elevation, most suggestive of post-seizure changes or early viral meningitis or early HSV encephalitis. This is not consistent bacterial meningitis with CSF cx negative. Stop antibiotics, continue acyclovir until HSV PCR confirmed negative. Overall her mentation is better but she is delusional this morning. Continue CIWA protocol. Minimize CNS active meds, particularly opiates and anything with strong anticholinergic properties. Benzos should be reserved for management of withdrawal symptoms or seizure lasting longer than two minutes. Continue to optimize metabolic status. She may benefit from low dose olanzapine 2.5 mg BID to start.   3. Cerebrovascular disease: Chronic, no acute issues. Continue risk factor modification, aspirin, statin.   4. Possible alcohol vs benzo withdrawal: Continue CIWA protocol.   This was discussed with the patient as much as her encephalopathy allows. No family was present.   This was also discussed with Dr. [place] Pink at the time of my visit.   [name], MD [facility]

## 2016-11-14 NOTE — Progress Notes (Signed)
[facility] PROGRESS NOTE  [name] [name] [id number] DOB: 20-Jun-1936 DOA: 11/10/2016  PCP: Birdie Sons, MD (Inactive)  Brief History/Interval Summary: 80 year old Caucasian female with a past medical history of hypertension, hyperlipidemia, GERD, hypothyroidism, history of stroke, presented from home with complaints of altered mental status and seizure activity. History was provided by the patient's husband. She does not have any known history of seizure disorder. Patient was found to have elevated blood pressure. She was seen by neurology. Hospitalized for further management. Patient became more encephalopathic. She had more episodes of seizures and became febrile. Patient underwent lumbar puncture. CSF was inconclusive for infection. Patient's mental status started improving.  Reason for Visit: Seizure  Consultants: Neurology  Procedures:  EEG "Impression: This is a mildly abnormal EEG due to mild intermixed slowing. This is suggestive of a mild encephalopathy but is nonspecific as to etiology. No evidence of seizure. Tremors are noted but have no electrical correlate. "  Lumbar puncture under fluoroscopy  Antibiotics: Started on Unasyn 11/30. Changed over to ceftriaxone, vancomycin and ampicillin 12/1 for presumed CNS infection. Acyclovir added 12/2  Subjective/Interval History: Patient seems to be doing well. She denies any complaints. Still appears to be distracted. Asking about going home.   ROS: Unable to do due to altered mental status  Objective:  Vital Signs  Vitals:   11/14/16 0134 11/14/16 0522 11/14/16 0545 11/14/16 0556  BP: (!) 153/53 (!) 182/70 (!) 148/60 (!) 158/55  Pulse: 63 65    Resp: 18     Temp: 98.4 F (36.9 C) 98.8 F (37.1 C)    TempSrc: Oral Oral    SpO2: 97% 95%    Weight:      Height:        Intake/Output Summary (Last 24 hours) at 11/14/16 0807 Last data filed at 11/14/16 0400  Gross per 24 hour  Intake              2448 ml  Output                1 ml  Net             2447 ml   Filed Weights   11/10/16 2215  Weight: 67.7 kg (149 lb 4 oz)    General appearance: Awake, alert, Mildly distracted Resp: Diminished air entry at the bases. Crackles or wheezing. No rhonchi. Cardio: regular rate and rhythm, S1, S2 normal, no murmur, click, rub or gallop GI: Abdomen is soft, nondistended. Bowel sounds present. No masses or organomegaly otherwise. Neurologic: Awake, alert. Oriented to place, month. Cannot tell me the year. Could tell me the president's name. Moving all her extremities. Continues to be tremulous.   Lab Results:  Data Reviewed: I have personally reviewed following labs and imaging studies  CBC:  Recent Labs Lab 11/10/16 2152 11/10/16 2158 11/11/16 1217 11/12/16 0530 11/13/16 0522 11/14/16 0220  WBC 7.9  --  9.6 14.3* 9.4 11.7*  NEUTROABS 5.4  --   --   --   --   --   HGB 12.6 12.2 12.1 11.9* 10.7* 10.9*  HCT 37.7 36.0 36.0 35.9* 33.8* 33.3*  MCV 89.1  --  88.2 89.8 93.6 91.2  PLT 182  --  226 232 170 [id number]    Basic Metabolic Panel:  Recent Labs Lab 11/10/16 2152 11/10/16 2158 11/11/16 0200 11/11/16 1217 11/12/16 0530 11/13/16 0522 11/14/16 0220  NA 140 142  --  141 145 148* 140  K 3.1* 3.1*  --  3.0* 3.0* 3.1* 3.4*  CL 105 104  --  104 108 115* 110  CO2 26  --   --  22 24 22 22   GLUCOSE 137* 132*  --  132* 146* 107* 130*  BUN 14 17  --  13 11 13 11   CREATININE 0.84 0.90  --  0.97 1.03* 0.89 0.86  CALCIUM 8.6*  --   --  8.6* 8.7* 8.1* 8.2*  MG  --   --  1.6*  --   --  2.3  --     GFR: Estimated Creatinine Clearance: 47.5 mL/min (by C-G formula based on SCr of 0.86 mg/dL).  Liver Function Tests:  Recent Labs Lab 11/10/16 2152 11/11/16 1217 11/12/16 0530 11/13/16 0522  AST 26 25 25 18   ALT 19 19 17 15   ALKPHOS 95 84 83 68  BILITOT 0.8 1.3* 0.9 0.6  PROT 6.3* 6.2* 6.3* 5.0*  ALBUMIN 3.3* 3.4* 3.3* 2.5*    Coagulation Profile:  Recent Labs Lab  11/10/16 2152 11/10/16 2348  INR 1.01 1.01    CBG:  Recent Labs Lab 11/10/16 2151 11/11/16 0813 11/12/16 0626 11/13/16 0658 11/14/16 0655  GLUCAP 127* 145* 146* 114* 127*     Radiology Studies: Dg Chest Port 1 View  Result Date: 11/12/2016 CLINICAL DATA:  Fever.  Confusion. EXAM: PORTABLE CHEST 1 VIEW COMPARISON:  11/11/2016 FINDINGS: Chronic cardiomegaly. Stable aortic and hilar contours. Low volume chest with interstitial crowding. No edema, effusion, or air bronchogram. Previous spinal, left glenohumeral, and left clavicle surgery with hardware. IMPRESSION: Limited low volume chest without acute or focal finding. Electronically Signed   By: Monte Fantasia M.D.   On: 11/12/2016 09:51   Dg Fluoro Guide Lumbar Puncture  Result Date: 11/12/2016 CLINICAL DATA:  Acute encephalopathy in altered mental status. EXAM: DIAGNOSTIC LUMBAR PUNCTURE UNDER FLUOROSCOPIC GUIDANCE FLUOROSCOPY TIME:  Fluoroscopy Time:  0 minutes, 36 seconds Radiation Exposure Index (if provided by the fluoroscopic device): 2.8 mGy Number of Acquired Spot Images: 0 PROCEDURE: I discussed the risks (including hemorrhage, infection, headache, and nerve damage, among others), benefits, and alternatives to fluoroscopically guided lumbar puncture with the patient's husband, who is making her medical decisions at this time. We specifically discussed the high technical likelihood of success of the procedure. Mr. Corrie understood and elected for the patient to undergo the procedure. Standard time-out was employed. Following sterile skin prep and local anesthetic administration consisting of 1 percent lidocaine, a 22 gauge spinal needle was advanced without difficulty into the thecal sac at the at the L2-3 level. Clear CSF was returned. Opening pressure was 18 cm of water. However, this estimate was made with the patient supine, as I did not feel it was safe to try to turn her on her side due to the altered mental status. 13 cc of  CSF was collected. The first 2 both blood tinged, but this cleared on the subsequent tubes. The needle was subsequently removed and the skin cleansed and bandaged. No immediate complications were observed. IMPRESSION: 1. Technically successful fluoroscopically guided lumbar puncture, yielding 13 cc of CSF. No immediate complications observed. Electronically Signed   By: Van Clines M.D.   On: 11/12/2016 16:51     Medications:  Scheduled: .  stroke: mapping our early stages of recovery book   Does not apply Once  . acyclovir  10 mg/kg Intravenous Q8H  . ampicillin (OMNIPEN) IV  2 g Intravenous Q6H  . aspirin  325 mg Oral Daily  . atenolol  50 mg Oral q morning - 10a  . cefTRIAXone (ROCEPHIN)  IV  2 g Intravenous Q12H  . diltiazem  180 mg Oral Daily  . levETIRAcetam  500 mg Intravenous BID  . levothyroxine  75 mcg Oral QAC breakfast  . lidocaine (PF)  5 mL Intradermal Once  . pantoprazole  40 mg Oral Daily  . potassium chloride  40 mEq Oral Once  . saccharomyces boulardii  250 mg Oral BID  . sodium phosphate  1 enema Rectal Once  . thiamine  100 mg Oral Daily  . vancomycin  1,000 mg Intravenous Q24H   Continuous: . dextrose 5 % and 0.45 % NaCl with KCl 20 mEq/L 75 mL/hr at 11/13/16 [id number]   [id number] **OR** acetaminophen, hydrALAZINE, lip balm, LORazepam, LORazepam **OR** LORazepam, ondansetron (ZOFRAN) IV, senna-docusate, sodium chloride flush  Assessment/Plan:  Principal Problem:   Acute encephalopathy Active Problems:   GERD (gastroesophageal reflux disease)   Anxiety   Hypokalemia   Chronic diastolic CHF (congestive heart failure) (HCC)   Hypothyroidism   Hypertensive urgency   Altered mental state   Seizure ([place])   Febrile   Cerebrovascular disease   Alcohol withdrawal delirium ([place])     Acute encephalopathy, Acute delirium/possible withdrawal Apparently, according to patient's family, she abuses xanax and consumes alcohol at home. It is unclear how  much she drinks. I was told that she drinks 2-4 glasses of wines per day, but apparently it's more than that. Etiology for delirium appears to be multifactorial including drug withdrawal, alcohol withdrawal, seizures. She also developed fever. Initial x-rays suggested pneumonitis. However, subsequent x-ray did not show any acute abnormalities. LP was done 12/1. CT head was unremarkable. MRI brain does not show any stroke or any other concerning findings. EEG is as above. Neurology is following  Paroxysmal Atrial Fibrillation Patient went into rapid atrial fibrillation on 12/2. Patient does not have any history of same. Could be due to acute illness. TSH was normal. Echocardiogram has been ordered. Patient was already on a beta blocker. Patient was given oral Cardizem. Sometime yesterday evening she converted back to sinus rhythm.  Fever/possible viral meningitis See above. Could also be due to the withdrawal process. Blood cultures negative so far. Lumbar puncture was done. CSF does show few WBCs. Glucose is normal. Protein is mildly elevated. Not conclusive for bacterial meningitis. Could be suggestive of viral meningitis. Gram stain did not show any organisms. Continue ceftriaxone, vancomycin and ampicillin for now. Added acyclovir. HSV PCR is pending. Cultures are pending. VDRL is pending. Continue droplet precautions. If cultures remain negative, could discontinue antibacterial agents. Discussed with neurology.  Seizures. No known history of same. Hasn't had any further seizures in the last 24 hours Continue seizure precautions. Continue Keppra, but change to oral. EEG is as above. MRI as mentioned above. Neurology is following.  Nausea and vomiting Appears to have resolved. Abdominal film did not show any obstruction. Evidence for mild fecal impaction. Abdomen remains benign. Continue to monitor for now.  Hypertensive urgency Blood pressure remains high, but improved compared to the time of  admission. Continue hydralazine as needed. Her home medications have been reinitiated.  Dehydration with mildly elevated creatinine/mild acute renal failure Creatinine is improved. Patient is now taking orally. Cut back on IV fluids.  Chronic systolic congestive heart failure 2-D echo 09/05/14 showed EF 60-65 percent with grade 1 diastolic dysfunction. Echocardiogram has been ordered due to A. fib as discussed above.  Hyperlipidemia  Continuing home medications when able to take  orally  GERD: Protonix  Depression and anxiety: Hold her home medications. Due to altered mental status.  Hypokalemia and hypomagnesemia Replace potassium.  Hypothyroidism TSH is normal. Continue Synthroid   DVT Prophylaxis: Lovenox   Code Status: Full code  Family Communication: No family at bedside. Discussed with husband at bedside yesterday Disposition Plan: Management as outlined above. Await further improvement. PT/OT evaluation is pending.    LOS: 3 days   [facility] Pager [phone] 11/14/2016, 8:07 AM  If 7PM-7AM, please contact night-coverage at [email or web address], password [facility]

## 2016-11-14 NOTE — Progress Notes (Signed)
Occupational Therapy Evaluation Patient Details Name: [name] [name] MRN: [id number] DOB: Feb 17, 1936 Today's Date: 11/14/2016    History of Present Illness 80 year old Caucasian female with a past medical history of hypertension, hyperlipidemia, GERD, hypothyroidism, history of stroke, presented from home with complaints of altered mental status and seizure activity. History was provided by the patient's husband. She does not have any known history of seizure disorder. Patient was found to have elevated blood pressure. She was seen by neurology. Hospitalized for further management. Patient became more encephalopathic. She had more episodes of seizures and became febrile. Patient underwent lumbar puncture. CSF was inconclusive for infection.   Clinical Impression   PTA, pt lived at home with husband and was independent with ADL and mobility. Pt currently requrires min A with mobility and mod A with ADL and demonstrates apparent cognitive deficits. Pt will benefi tform rehab at SNF. Family agreeable and  prefers SNF in [facility] area. Will follow acutley to address established goals.     Follow Up Recommendations  SNF;Supervision/Assistance - 24 hour    Equipment Recommendations  3 in 1 bedside comode    Recommendations for Other Services       Precautions / Restrictions Precautions Precautions: Fall;Other (comment) (seizure) Precaution Comments: rail pads intact, mat on right side floor next to bed, alarm      Mobility Bed Mobility Overal bed mobility: Needs Assistance Bed Mobility: Supine to Sit;Sit to Supine;Rolling Rolling: Modified independent (Device/Increase time)   Supine to sit: Supervision Sit to supine: Supervision   General bed mobility comments: supervision for safety given cog/psych concerns and seizure precautions but physically able to perform unassisted  Transfers Overall transfer level: Needs assistance Equipment used: 1 person hand held assist Transfers:  Sit to/from Stand Sit to Stand: Min assist         General transfer comment: needs incr assist first trial due to insecurity about stability; improved to min assist pt-90% with RW    Balance Overall balance assessment: History of Falls Sitting-balance support: Single extremity supported;Feet supported Sitting balance-Leahy Scale: Good     Standing balance support: Bilateral upper extremity supported;During functional activity Standing balance-Leahy Scale: Poor                              ADL Overall ADL's : Needs assistance/impaired Eating/Feeding: Set up;Supervision/ safety   Grooming: Minimal assistance Grooming Details (indicate cue type and reason): difficulty with sequencing. Cues to put cap on toothbrush. Cues to turn off water Upper Body Bathing: Moderate assistance   Lower Body Bathing: Moderate assistance;Sit to/from stand   Upper Body Dressing : Moderate assistance;Sitting   Lower Body Dressing: Maximal assistance;Sit to/from stand   Toilet Transfer: Ambulation;Minimal assistance   Toileting- Clothing Manipulation and Hygiene: Maximal assistance (foley/incontinenet)       Functional mobility during ADLs: Minimal assistance       Vision Vision Assessment?:  (will further assess)   Perception     Praxis Praxis Praxis tested?: Deficits Deficits: Organization    Pertinent Vitals/Pain Pain Assessment: No/denies pain     Hand Dominance Right   Extremity/Trunk Assessment Upper Extremity Assessment Upper Extremity Assessment: Generalized weakness   Lower Extremity Assessment Lower Extremity Assessment: Generalized weakness   Cervical / Trunk Assessment Cervical / Trunk Assessment: Normal   Communication Communication Communication: HOH   Cognition Arousal/Alertness: Awake/alert Behavior During Therapy: Anxious Overall Cognitive Status: Impaired/Different from baseline Area of Impairment: Awareness;Following  commands;Safety/judgement;Problem solving  Memory: Decreased short-term memory Following Commands: Follows one step commands with increased time Safety/Judgement: Decreased awareness of deficits;Decreased awareness of safety Awareness: Intellectual Problem Solving: Slow processing;Decreased initiation;Difficulty sequencing General Comments: Pt's communciation appropriate today. Difficulty with sequencing ADL tasks. Pt saw herself in [place] and stated that she looked like an "alien"   General Comments       Exercises       Shoulder Instructions      Home Living Family/patient expects to be discharged to:: Skilled nursing facility Living Arrangements: Spouse/significant other Available Help at Discharge: Available 24 hours/day;Family Type of Home: House Home Access: Stairs to enter Entrance Stairs-Number of Steps: 1   Home Layout: Two level;Able to live on main level with bedroom/bathroom Alternate Level Stairs-Number of Steps: 10 Alternate Level Stairs-Rails: Left Bathroom Shower/Tub: Tub/shower unit;Curtain   Bathroom Toilet: Standard     Home Equipment: Environmental consultant - 2 wheels;Cane - single point;Bedside commode;Shower seat      Lives With: Spouse    Prior Functioning/Environment Level of Independence: Independent with assistive device(s)        Comments: per daughter, pt has variable independence, needing RW some days, walking without on others; hx falls, could not suss out details        OT Problem List: Decreased strength;Decreased activity tolerance;Impaired balance (sitting and/or standing);Decreased cognition;Decreased safety awareness;Decreased knowledge of use of DME or AE   OT Treatment/Interventions: Self-care/ADL training;Therapeutic exercise;Energy conservation;DME and/or AE instruction;Therapeutic activities;Cognitive remediation/compensation;Patient/family education;Balance training    OT Goals(Current goals can be found in the care plan section) Acute  Rehab OT Goals Patient Stated Goal: to get better OT Goal Formulation: With patient Time For Goal Achievement: 11/28/16 Potential to Achieve Goals: Good ADL Goals Pt Will Perform Upper Body Bathing: with set-up;with supervision;sitting Pt Will Perform Lower Body Bathing: with set-up;sitting/lateral leans;with supervision Pt Will Perform Upper Body Dressing: with set-up;with supervision;sitting Pt Will Perform Lower Body Dressing: with set-up;with supervision;sit to/from stand Pt Will Transfer to Toilet: with supervision;ambulating;bedside commode Pt Will Perform Toileting - Clothing Manipulation and hygiene: with supervision;sitting/lateral leans Additional ADL Goal #1: Pt will demonstrate emegent awareness in moderately distracting environment  OT Frequency: Min 2X/week   Barriers to D/C:            Co-evaluation              End of Session Equipment Utilized During Treatment: Gait belt Nurse Communication: Mobility status  Activity Tolerance: Patient tolerated treatment well Patient left: in bed;with call bell/phone within reach;with bed alarm set;with family/visitor present;with SCD's reapplied   Time: [id number] OT Time Calculation (min): 26 min Charges:  OT General Charges $OT Visit: 1 Procedure OT Evaluation $OT Eval Moderate Complexity: 1 Procedure OT Treatments $Self Care/Home Management : 8-22 mins G-Codes:    Rubens Cranston,HILLARY 2016/12/14, 2:42 PM   [facility], OTR/L  [phone] 12/14/16

## 2016-11-14 NOTE — Progress Notes (Signed)
Physical Therapy Evaluation Patient Details Name: [name] [name] MRN: [id number] DOB: [birth year] Today's Date: 11/14/2016   History of Present Illness   80 year old Caucasian female with a past medical history of hypertension, hyperlipidemia, GERD, hypothyroidism, history of stroke, presented from home with complaints of altered mental status and seizure activity. History was provided by the patient's husband. She does not have any known history of seizure disorder. Patient was found to have elevated blood pressure. She was seen by neurology. Hospitalized for further management. Patient became more encephalopathic. She had more episodes of seizures and became febrile. Patient underwent lumbar puncture. CSF was inconclusive for infection.   Clinical Impression  Pt presents with moderate limitations to functional mobility due to weakness and instability resulting in dependencies in basic mobility primarily for safety.  Pt is delusional, would neuropsych consult be indicated?  Pt was independent prior to admission, though daughter reports fluctuating need for RW for ambulation at home over past 1-2 weeks.  Recommend initiate PT in acute setting to progress mobility. At d/c pt will likely need 24 hour supervision/assistance, will need to clarify with spouse how independent she needs to be for him to provide care at home. If pt cannot perform adequately for safe home d/c (which daughter prefers), may need to consider SNF.  PT will assist with d/c recommendations as d/c draws near.      Follow Up Recommendations  (tbd as cog/psych resolves or progresses)    Equipment Recommendations  None recommended by PT    Recommendations for Other Services       Precautions / Restrictions Precautions Precautions: Fall;Other (comment) (seizure) Precaution Comments: rail pads intact, mat on right side floor next to bed, alarm      Mobility  Bed Mobility Overal bed mobility: Needs Assistance Bed Mobility:  Supine to Sit;Sit to Supine;Rolling Rolling: Modified independent (Device/Increase time)   Supine to sit: Supervision Sit to supine: Supervision   General bed mobility comments: supervision for safety given cog/psych concerns and seizure precautions but physically able to perform unassisted  Transfers Overall transfer level: Needs assistance Equipment used: Rolling walker (2 wheeled) Transfers: Sit to/from Stand Sit to Stand: Min assist         General transfer comment: needs incr assist first trial due to insecurity about stability; improved to min assist pt-90% with RW  Ambulation/Gait Ambulation/Gait assistance: Min assist Ambulation Distance (Feet): 20 Feet Assistive device: Rolling walker (2 wheeled) Gait Pattern/deviations: Step-through pattern;Decreased stride length;Trunk flexed;Narrow base of support     General Gait Details: in room only (droplet precautions and pt reluctant to wear mask), needs incr assist manevuering over fall mat on floor and in tight spaces, especially to adjust RW relative to body with turns and to prevent line entanglement  Stairs            Wheelchair Mobility    Modified Rankin (Stroke Patients Only)       Balance Overall balance assessment: History of Falls Sitting-balance support: Single extremity supported;Feet supported Sitting balance-Leahy Scale: Good     Standing balance support: Bilateral upper extremity supported;During functional activity Standing balance-Leahy Scale: Poor                               Pertinent Vitals/Pain Pain Assessment: No/denies pain    Home Living Family/patient expects to be discharged to:: Private residence Living Arrangements: Spouse/significant other Available Help at Discharge: Available 24 hours/day;Family Type of Home: [place]  Access: Stairs to enter   [employer] of Steps: 1 Home Layout: Two level;Able to live on main level with bedroom/bathroom Home  Equipment: Gilford Rile - 2 wheels;Cane - single point;Bedside commode;Shower seat      Prior Function Level of Independence: Independent with assistive device(s)         Comments: per daughter, pt has variable independence, needing RW some days, walking without on others; hx falls, could not suss out details     Hand Dominance   Dominant Hand: Right    Extremity/Trunk Assessment   Upper Extremity Assessment: Overall WFL for tasks assessed;Defer to OT evaluation           Lower Extremity Assessment: Overall WFL for tasks assessed;Generalized weakness      Cervical / Trunk Assessment: Normal  Communication   Communication: HOH  Cognition Arousal/Alertness: Awake/alert Behavior During Therapy: Anxious Overall Cognitive Status: Impaired/Different from baseline Area of Impairment: Awareness           Awareness: Emergent   General Comments: pt remains delusional, says she's ready to die, to go to a better place.  RN aware, unable to determine etiology currently    General Comments      Exercises     Assessment/Plan    PT Assessment Patient needs continued PT services  PT Problem List Decreased safety awareness;Decreased knowledge of use of DME;Decreased cognition;Decreased mobility;Decreased strength          PT Treatment Interventions DME instruction;Gait training;Functional mobility training;Therapeutic activities;Patient/family education    PT Goals (Current goals can be found in the Care Plan section)  Acute Rehab PT Goals Patient Stated Goal: go to a better place... PT Goal Formulation: With family Time For Goal Achievement: 11/28/16 Potential to Achieve Goals: Good    Frequency Min 3X/week   Barriers to discharge   tbd what level of independence spouse requires and whether cog/psych problem persists or risks safety    Co-evaluation               End of Session Equipment Utilized During Treatment: Gait belt Activity Tolerance: Patient  tolerated treatment well Patient left: in bed;with call bell/phone within reach;with family/visitor present Nurse Communication: Mobility status         Time: [id number] PT Time Calculation (min) (ACUTE ONLY): 27 min   Charges:   PT Evaluation $PT Eval Moderate Complexity: 1 Procedure PT Treatments $Gait Training: 8-22 mins   PT G Codes:        Herbie Drape 11/14/2016, 12:01 PM

## 2016-11-15 DIAGNOSIS — Z888 Allergy status to other drugs, medicaments and biological substances status: Secondary | ICD-10-CM

## 2016-11-15 DIAGNOSIS — Z8249 Family history of ischemic heart disease and other diseases of the circulatory system: Secondary | ICD-10-CM

## 2016-11-15 DIAGNOSIS — Z79899 Other long term (current) drug therapy: Secondary | ICD-10-CM

## 2016-11-15 DIAGNOSIS — F13931 Sedative, hypnotic or anxiolytic use, unspecified with withdrawal delirium: Secondary | ICD-10-CM

## 2016-11-15 DIAGNOSIS — R410 Disorientation, unspecified: Secondary | ICD-10-CM

## 2016-11-15 DIAGNOSIS — Z801 Family history of malignant neoplasm of trachea, bronchus and lung: Secondary | ICD-10-CM

## 2016-11-15 LAB — GLUCOSE, CAPILLARY: GLUCOSE-CAPILLARY: 146 mg/dL — AB (ref 65–99)

## 2016-11-15 LAB — VDRL, CSF: VDRL Quant, CSF: NONREACTIVE

## 2016-11-15 LAB — PATHOLOGIST SMEAR REVIEW

## 2016-11-15 MED ORDER — CLONAZEPAM 0.5 MG PO TABS
0.5000 mg | ORAL_TABLET | Freq: Two times a day (BID) | ORAL | Status: DC
  Administered 2016-11-15 – 2016-11-18 (×6): 0.5 mg via ORAL
  Filled 2016-11-15 (×6): qty 1

## 2016-11-15 MED ORDER — THIAMINE HCL 100 MG/ML IJ SOLN: 100.0000 mg | Freq: Every day | INTRAMUSCULAR | Status: DC

## 2016-11-15 MED ORDER — ENSURE ENLIVE PO LIQD
237.0000 mL | Freq: Two times a day (BID) | ORAL | Status: DC
  Administered 2016-11-15 – 2016-11-18 (×5): 237 mL via ORAL
  Filled 2016-11-15 (×9): qty 237

## 2016-11-15 MED ORDER — QUETIAPINE FUMARATE 25 MG PO TABS
25.0000 mg | ORAL_TABLET | Freq: Every day | ORAL | Status: DC
  Administered 2016-11-15 – 2016-11-17 (×3): 25 mg via ORAL
  Filled 2016-11-15 (×3): qty 1

## 2016-11-15 MED ORDER — GABAPENTIN 800 MG PO TABS
400.0000 mg | ORAL_TABLET | Freq: Three times a day (TID) | ORAL | Status: DC
  Filled 2016-11-15 (×2): qty 0.5

## 2016-11-15 MED ORDER — VENLAFAXINE HCL ER 75 MG PO CP24
75.0000 mg | ORAL_CAPSULE | Freq: Every day | ORAL | Status: DC
  Administered 2016-11-16 – 2016-11-18 (×3): 75 mg via ORAL
  Filled 2016-11-15 (×3): qty 1

## 2016-11-15 MED ORDER — GABAPENTIN 400 MG PO CAPS
400.0000 mg | ORAL_CAPSULE | Freq: Three times a day (TID) | ORAL | Status: DC
  Administered 2016-11-15 – 2016-11-18 (×8): 400 mg via ORAL
  Filled 2016-11-15 (×9): qty 1

## 2016-11-15 MED ORDER — THIAMINE HCL 100 MG/ML IJ SOLN
500.0000 mg | Freq: Three times a day (TID) | INTRAVENOUS | Status: DC
  Administered 2016-11-15 – 2016-11-17 (×7): 500 mg via INTRAVENOUS
  Filled 2016-11-15 (×9): qty 5

## 2016-11-15 MED ORDER — ATENOLOL 100 MG PO TABS
100.0000 mg | ORAL_TABLET | Freq: Every morning | ORAL | Status: DC
  Administered 2016-11-16 – 2016-11-18 (×3): 100 mg via ORAL
  Filled 2016-11-15 (×4): qty 1

## 2016-11-15 NOTE — Progress Notes (Signed)
Attempting a few bites of her dinner meal; drinking fluids without issue; patient states,"I have no taste for food". Patient stating, "these people are still over me and want me to do bad things"; support and reassurance of safety given to patient; continue with CIWA assessment.

## 2016-11-15 NOTE — Progress Notes (Signed)
[facility] PROGRESS NOTE  [name] [name] [id number] DOB: Mar 05, 1936 DOA: 11/10/2016  PCP: Birdie Sons, MD (Inactive)  Brief History/Interval Summary: 80 year old Caucasian female with a past medical history of hypertension, hyperlipidemia, GERD, hypothyroidism, history of stroke, presented from home with complaints of altered mental status and seizure activity. History was provided by the patient's husband. She does not have any known history of seizure disorder. Patient was found to have elevated blood pressure. She was seen by neurology. Hospitalized for further management. Patient became more encephalopathic. She had more episodes of seizures and became febrile. Patient underwent lumbar puncture. CSF was inconclusive for infection. Patient's mental status started improving.  Reason for Visit: Seizure  Consultants: Neurology. Psychiatry  Procedures:  EEG "Impression: This is a mildly abnormal EEG due to mild intermixed slowing. This is suggestive of a mild encephalopathy but is nonspecific as to etiology. No evidence of seizure. Tremors are noted but have no electrical correlate. "  Lumbar puncture under fluoroscopy  Transthoracic echocardiogram Study Conclusions  - Procedure narrative: Transthoracic echocardiography. Image   quality was adequate. The study was technically difficult. - Left ventricle: The cavity size was normal. There was moderate   concentric hypertrophy. Systolic function was normal. The   estimated ejection fraction was in the range of 60% to 65%. Wall   motion was normal; there were no regional wall motion   abnormalities. Features are consistent with a pseudonormal left   ventricular filling pattern, with concomitant abnormal relaxation   and increased filling pressure (grade 2 diastolic dysfunction). - Aortic valve: There was trivial regurgitation. - Mitral valve: Calcified annulus. Mildly thickened leaflets .   There was mild  regurgitation. - Left atrium: The atrium was mildly dilated. - Pulmonary arteries: Systolic pressure was mildly increased. PA   peak pressure: 37 mm Hg (S).  Antibiotics: Started on Unasyn 11/30. Changed over to ceftriaxone, vancomycin and ampicillin 12/1 for presumed CNS infection. Acyclovir added 12/2 Ceftriaxone, vancomycin and ampicillin discontinued 12/3  Subjective/Interval History: Patient not as talkative today as yesterday. Denies any complaints.   ROS: Denies any nausea or vomiting.  Objective:  Vital Signs  Vitals:   11/15/16 0137 11/15/16 0548 11/15/16 0623 11/15/16 0655  BP: (!) 171/62 (!) 190/67 (!) 183/63 (!) 170/62  Pulse: 87 80    Resp: 20 20    Temp: 98.4 F (36.9 C) 98.8 F (37.1 C)    TempSrc: Oral Oral    SpO2: 96% 96%    Weight:      Height:        Intake/Output Summary (Last 24 hours) at 11/15/16 0927 Last data filed at 11/15/16 0449  Gross per 24 hour  Intake           2035.5 ml  Output             1625 ml  Net            410.5 ml   Filed Weights   11/10/16 2215  Weight: 67.7 kg (149 lb 4 oz)    General appearance: Awake, alert, Mildly distracted Resp: Diminished air entry at the bases. Crackles or wheezing. No rhonchi. Cardio: regular rate and rhythm, S1, S2 normal, no murmur, click, rub or gallop GI: Abdomen is soft, nondistended. Bowel sounds present. No masses or organomegaly otherwise. Neurologic: Awake, alert. Oriented to place, month. Cannot tell me the year. Could tell me the president's name. Moving all her extremities. Continues to be tremulous. Somewhat flat affect this morning.  Lab  Results:  Data Reviewed: I have personally reviewed following labs and imaging studies  CBC:  Recent Labs Lab 11/10/16 2152 11/10/16 2158 11/11/16 1217 11/12/16 0530 11/13/16 0522 11/14/16 0220  WBC 7.9  --  9.6 14.3* 9.4 11.7*  NEUTROABS 5.4  --   --   --   --   --   HGB 12.6 12.2 12.1 11.9* 10.7* 10.9*  HCT 37.7 36.0 36.0 35.9* 33.8*  33.3*  MCV 89.1  --  88.2 89.8 93.6 91.2  PLT 182  --  226 232 170 [id number]    Basic Metabolic Panel:  Recent Labs Lab 11/10/16 2152 11/10/16 2158 11/11/16 0200 11/11/16 1217 11/12/16 0530 11/13/16 0522 11/14/16 0220  NA 140 142  --  141 145 148* 140  K 3.1* 3.1*  --  3.0* 3.0* 3.1* 3.4*  CL 105 104  --  104 108 115* 110  CO2 26  --   --  22 24 22 22   GLUCOSE 137* 132*  --  132* 146* 107* 130*  BUN 14 17  --  13 11 13 11   CREATININE 0.84 0.90  --  0.97 1.03* 0.89 0.86  CALCIUM 8.6*  --   --  8.6* 8.7* 8.1* 8.2*  MG  --   --  1.6*  --   --  2.3  --     GFR: Estimated Creatinine Clearance: 47.5 mL/min (by C-G formula based on SCr of 0.86 mg/dL).  Liver Function Tests:  Recent Labs Lab 11/10/16 2152 11/11/16 1217 11/12/16 0530 11/13/16 0522  AST 26 25 25 18   ALT 19 19 17 15   ALKPHOS 95 84 83 68  BILITOT 0.8 1.3* 0.9 0.6  PROT 6.3* 6.2* 6.3* 5.0*  ALBUMIN 3.3* 3.4* 3.3* 2.5*    Coagulation Profile:  Recent Labs Lab 11/10/16 2152 11/10/16 2348  INR 1.01 1.01    CBG:  Recent Labs Lab 11/11/16 0813 11/12/16 0626 11/13/16 0658 11/14/16 0655 11/15/16 0634  GLUCAP 145* 146* 114* 127* 146*     Radiology Studies: No results found.   Medications:  Scheduled: .  stroke: mapping our early stages of recovery book   Does not apply Once  . acyclovir  10 mg/kg Intravenous Q8H  . aspirin  325 mg Oral Daily  . atenolol  100 mg Oral q morning - 10a  . diltiazem  180 mg Oral Daily  . levETIRAcetam  500 mg Oral BID  . levothyroxine  75 mcg Oral QAC breakfast  . lidocaine (PF)  5 mL Intradermal Once  . pantoprazole  40 mg Oral Daily  . saccharomyces boulardii  250 mg Oral BID  . sodium phosphate  1 enema Rectal Once  . thiamine  100 mg Oral Daily   Continuous: . dextrose 5 % and 0.45 % NaCl with KCl 20 mEq/L 50 mL/hr at 11/15/16 0449   [id number] **OR** acetaminophen, hydrALAZINE, lip balm, LORazepam, ondansetron (ZOFRAN) IV, senna-docusate, sodium  chloride flush  Assessment/Plan:  Principal Problem:   Acute encephalopathy Active Problems:   GERD (gastroesophageal reflux disease)   Anxiety   Hypokalemia   Chronic diastolic CHF (congestive heart failure) (HCC)   Hypothyroidism   Hypertensive urgency   Altered mental state   Seizure ([place])   Fever   Cerebrovascular disease   Alcohol withdrawal delirium (HCC)   Paroxysmal atrial fibrillation (HCC)     Acute encephalopathy, Acute delirium/possible withdrawal Apparently, according to patient's family, she abuses xanax and consumes alcohol at home. It is unclear how much she  drinks. I was told that she drinks 2-4 glasses of wines per day, but apparently it's more than that. Etiology for delirium appears to be multifactorial including drug withdrawal, alcohol withdrawal, seizures. She also developed fever. Initial x-rays suggested pneumonitis. However, subsequent x-ray did not show any acute abnormalities. LP was done 12/1. CT head was unremarkable. MRI brain does not show any stroke or any other concerning findings. EEG is as above. Neurology is following. Patient has made bizarre statements. See neurology note from 12/3. Will consult psychiatry to assist with management.  Paroxysmal Atrial Fibrillation Patient went into rapid atrial fibrillation on 12/2. Patient does not have any history of same. Could be due to acute illness. TSH was normal. Echocardiogram has been ordered. Patient was already on a beta blocker. Patient was given oral Cardizem. She then converted back to sinus rhythm within a few hours. Echocardiogram does show evidence for diastolic dysfunction. Chads2vasc score appears to be 5. However, patient does have a significant history of alcohol abuse. The risks of putting her on oral anticoagulation at this time is high. Her HAS-BLED is 3 with an estimated risk of major bleeding with one year of oral anticoagulation to be 4.9-19.6%. We will place her just on aspirin for now.  Outpatient follow-up with primary care to determine if she would be a candidate for oral anticoagulation in the near future. But she will need to stop abusing alcohol.  Fever/possible viral meningitis See above. Could also be due to the withdrawal process. Blood cultures negative so far. Lumbar puncture was done. CSF does show few WBCs. Glucose is normal. Protein is mildly elevated. Not conclusive for bacterial meningitis. Could be suggestive of viral meningitis. Gram stain did not show any organisms. Patient was placed on ceftriaxone, vancomycin and ampicillin. Acyclovir was added. CSF cultures are all negative so far. Antibiotics were stopped yesterday. Acyclovir is being continued. HSV appears to be negative. VDRL is pending. Okay to discontinue droplet precautions.  Seizures possibly due to drug withdrawal. No known history of same. Hasn't had any further seizures in the last 24 hours Continue seizure precautions. Continue Keppra, EEG is as above. MRI as mentioned above. Neurology is following.  Nausea and vomiting Appears to have resolved. Abdominal film did not show any obstruction. Evidence for mild fecal impaction. Abdomen remains benign. Continue to monitor for now.  Hypertensive urgency Blood pressure remains high, but improved compared to the time of admission. Continue hydralazine as needed. Increase dose of atenolol. Continue Cardizem. May need to further increase the doses of these medications.   Dehydration with mildly elevated creatinine/mild acute renal failure Creatinine is improved. Patient is now taking orally. Cut back on IV fluids.  Chronic systolic congestive heart failure Cardiogram as above. Grade 2 diastolic dysfunction noted.  Hyperlipidemia  Continuing home medications when able to take orally  GERD: Protonix  Depression and anxiety: Since she is now much more awake and alert, she could be restarted on her home medications. However, due to her bizarre  behavior would prefer psychiatry address these.  Hypokalemia and hypomagnesemia Replace potassium. Recheck labs tomorrow  Hypothyroidism TSH is normal. Continue Synthroid   DVT Prophylaxis: Lovenox   Code Status: Full code  Family Communication: No family at bedside. Discussed with husband. Disposition Plan: Management as outlined above. Await further improvement in mental status. Will likely need to go to skilled nursing facility for rehabilitation.    LOS: 4 days   [facility] Pager [phone] 11/15/2016, 9:27 AM  If 7PM-7AM,  please contact night-coverage at [email or web address], password [facility]

## 2016-11-15 NOTE — Progress Notes (Signed)
Patient refusing po medications; patient stating,"I don't need them, I am going to die."  My husband would agree to me not taking them. MD notified, husband in to visit at bedside now.

## 2016-11-15 NOTE — Progress Notes (Addendum)
Subjective: No further seizures overnight. Husband states that she's been waxing and waning as far as her mentation. Currently she is confabulating about tape on her nose. Patient states that she is feeling very edgy and husband states that she seems to be paranoid and very anxious more than usual. She is currently on the CIWA protocol.  In talking to the patient she admits to abusing her Xanax. She states that she takes 1 mg or times a day however she admits at times to be taking up to 8 or 9 Xanax a day. Husband states that when she would run out she would drink wine, when she would drink wine this would include 2-3 bottles a day.  Currently she is tremulous, dilated pupils, confabulating and having difficulty with thought processing.  She continues to currently be on Keppra 500 mg twice a day with no complications.  She has been on thiamine 100 mg daily however given her prolonged history of Xanax use and also alcohol use will start patient on 500 mg of thiamine IV 3 times a day for 3 days and then go back down to 100 mg daily.  Exam: Vitals:   11/15/16 0655 11/15/16 1022  BP: (!) 170/62 (!) 181/77  Pulse:  99  Resp:  20  Temp:  98.3 F (36.8 C)        Gen: In bed, NAD MS: Patient is alert, able to follow simple commands, confused at date and place. Patient is discussing about a piece of tape that is on her nose which makes no sense. CN: Pupils are 4 mm and equally and reactive, extra intraocular muscles are intact however she has significant lateral nystagmus but no vertical nystagmus. Face is symmetrical and sensation is intact Motor: Patient is moving all extremities 5 out of 5 strength however she has significant tremor with arms held outstretched. Sensory: Sensation is intact.   Pertinent Labs/Diagnostics: HSV lab was nonreactive and thus acyclovir will be stopped  [name] PA-C [facility] [phone]  Impression/ plan 1) seizure: As per husband patient  ran out of her Xanax. Patient seizure most likely provoked secondary to benzo and alcohol withdrawal. Patient was on the CIWA protocol. Given patient's significant amount of Xanax she was on prior to being in the hospital would start her on clonazepam at a lower dose possibly .5 mg twice a day.  2) it was noted the patient was on 75 mg Effexor XR daily. This drug was immediately [place]. Effexor is one of the dopamine reuptake medications and I cannot be stopped abruptly. As this will cause significant side effects. Would place patient back on 75 mg a day or 37.5 mg a day if possible. To help with side effects.   3) Per [facility] statutes, patients with seizures are not allowed to drive until  they have been seizure-free for six months. Use caution when using heavy equipment or power tools. Avoid working on ladders or at heights. Take showers instead of baths. Ensure the water temperature is not too high on the home water heater. Do not go swimming alone. When caring for infants or small children, sit down when holding, feeding, or changing them to minimize risk of injury to the child in the event you have a seizure.   Dr. Leonel Ramsay to follow up in addend this note   11/15/2016, 11:57 AM    I have seen the patient and agree with the above note. 80 year old female with new onset seizures, subsequent delirium in  the setting of likely alcohol/benzodiazepine withdrawal. I suspect this essentially represents delirium tremens and appreciate psychiatry assistance. The pleocytosis is unsusual in such a case, but this could be due to seizure alone. Aseptic meningitis also possible.  Benzodiazepine withdrawal has much more prolonged course then alcohol withdrawal. I suspect that she will need a very long slow taper from benzodiazepines over the course of weeks to months.  Roland Rack, MD [facility] [phone]  If 7pm- 7am, please page neurology on call as listed in [place].

## 2016-11-15 NOTE — Care Management Note (Signed)
Case Management Note  Patient Details  Name: [name] [name] MRN: [id number] Date of Birth: [birth year]  Subjective/Objective:                    Action/Plan: Pt continues with confusion. On IV Zovirax. CM following for d/c needs.  Expected Discharge Date:  11/13/16               Expected Discharge Plan:  [place]  In-House Referral:  NA  Discharge planning Services  CM Consult  Post Acute Care Choice:    Choice offered to:     DME Arranged:    DME Agency:     HH Arranged:    HH Agency:     Status of Service:  In process, will continue to follow  If discussed at Long Length of Stay Meetings, dates discussed:    Additional Comments:  Pollie Friar, RN 11/15/2016, 11:39 AM

## 2016-11-15 NOTE — Consult Note (Signed)
[facility] Consult   Reason for Consult:  AMS, history of substance abuse (alcohol and xanax) and depression Referring Physician:  Dr.Krishnan Patient Identification: KARUNA BALDUCCI MRN:  [id number] Principal Diagnosis: Alcohol withdrawal delirium [facility]) Diagnosis:   Patient Active Problem List   Diagnosis Date Noted  . Paroxysmal atrial fibrillation (HCC) [I48.0]   . Cerebrovascular disease [I67.9]   . Alcohol withdrawal delirium ([place]) [F10.231]   . Fever [R50.9]   . Acute encephalopathy [G93.40] 11/10/2016  . Hypothyroidism [E03.9] 11/10/2016  . Hypertensive urgency [I16.0] 11/10/2016  . Altered mental state [R41.82] 11/10/2016  . Seizure ([place]) [R56.9] 11/10/2016  . Chronic diastolic CHF (congestive heart failure) ([place]) [I50.32]   . Left carotid stenosis [I65.22] 10/29/2016  . S/P total knee replacement [Z96.659] 03/10/2016  . 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 Ileus ([place]) [K56.7] 09/05/2014  . Leukocytosis, unspecified [D72.829] 09/05/2014  . Vomiting [R11.10] 09/04/2014  . Pulmonary edema [J81.1] 09/04/2014  . Hypokalemia [E87.6] 08/28/2014  . Enteritis due to Clostridium difficile [A04.72] 08/27/2014  . Common bile duct (CBD) stricture [K83.1] 08/27/2014  . Protein-calorie malnutrition, severe ([place]) [E43] 08/24/2014  . Obstructive jaundice [K83.8] 08/23/2014  . Acute cholangitis [K83.0] 08/23/2014  . ARF (acute renal failure) ([place]) [N17.9] 08/23/2014  . Coagulopathy ([place]) [D68.9] 08/23/2014  . Back pain [M54.9]   . Arthritis [M19.90]   . Hypertension [I10]   . GERD (gastroesophageal reflux disease) [K21.9]   . Anxiety [F41.9]   . IBS (irritable bowel syndrome) [K58.9]   . Hernia of Right posterior flank [K45.8] 04/18/2012    Total Time spent with patient: 45 minutes  Subjective:   CRISSIE ALOI is a 80 y.o. female patient admitted with Withdrawal seizures.   HPI: Joycelyn Liska is a 80 years old female admitted to [facility] with  clonic tonic seizures without history of seizure disorder. Patient was initially received neurology consult in the emergency department and loaded with Keppra. Further neurological evaluation indicated patient does not have seizure activity or stroke. Patient family reported that patient has been suffering with chronic depression, anxiety and receiving outpatient psychiatric medications including Xanax, Effexor and Neurontin and also reportedly taking Xanax excessively up to 7-8 pills a day and then ran out of the medications he started drinking wine up to 1-2 bottles a day. Patient is a poor historian during my evaluation reported she has visions of being doomed, rumination about destruction especially 911 event and reportedly seeing people who were not there and reportedly they are going to cause significant damage. Patient feels she is weak and tired could not fight with them. Patient also felt that "I want to die because of tired being here". Patient husband at bedside reported she has been seeing Dr. Toy Care, is a private psychiatrist providing outpatient medication management for long time. Patient denies previous history of alcohol/benzodiazepine withdrawal seizures and encephalopathy. She has no urine drug screen or blood alcohol level the time of presentation due to being presented with the violent seizure, encephalopathy in the emergency department.  Medical history: 80 year old Caucasian female with a past medical history of hypertension, hyperlipidemia, GERD, hypothyroidism, history of stroke, presented from home with complaints of altered mental status and seizure activity. History was provided by the patient's husband. She does not have any known history of seizure disorder. Patient was found to have elevated blood pressure. She was seen by neurology. Hospitalized for further management. Patient became more encephalopathic. She had more episodes of seizures and became febrile. Patient underwent lumbar  puncture. CSF was inconclusive for infection. Patient's mental status started improving.   Past Psychiatric History: Patient has been diagnosed with a chronic anxiety and depression and denied acute psychiatric admission.  Risk to Self: Is patient at risk for suicide?: No Risk to Others:   Prior Inpatient Therapy:   Prior Outpatient Therapy:    Past Medical History:  Past Medical History:  Diagnosis Date  . Anemia    hx of years ago   . Anxiety   . ARF (acute renal failure) ([place]) 08/23/2014  . Arthritis    psoriatic arthritis  . Back pain   . Blood transfusion    at age of 4  . Chronic diastolic CHF (congestive heart failure) ([place])   . Depression   . GERD (gastroesophageal reflux disease)   . H/O hiatal hernia   . Hyperlipidemia   . Hypertension   . Hypothyroidism   . IBS (irritable bowel syndrome)   . Pancreatitis   . PONV (postoperative nausea and vomiting)   . Thyroid disease     Past Surgical History:  Procedure Laterality Date  . ABDOMINAL HYSTERECTOMY     partial  . BACK SURGERY  2010   thoractic-screws and rods  . BILE DUCT STENT PLACEMENT  08/26/2014  . BREAST REDUCTION SURGERY    . CHOLECYSTECTOMY  1988   lap  . COLONOSCOPY    . COLONOSCOPY Left 09/08/2014   Procedure: COLONOSCOPY;  Surgeon: Arta Silence, MD;  Location: [facility] ENDOSCOPY;  Service: Endoscopy;  Laterality: Left;  . ERCP    . ERCP N/A 08/26/2014   Procedure: ENDOSCOPIC RETROGRADE CHOLANGIOPANCREATOGRAPHY (ERCP);  Surgeon: Jeryl Columbia, MD;  Location: [facility] ENDOSCOPY;  Service: Endoscopy;  Laterality: N/A;  . ERCP N/A 09/11/2014   Procedure: ENDOSCOPIC RETROGRADE CHOLANGIOPANCREATOGRAPHY (ERCP);  Surgeon: Arta Silence, MD;  Location: [facility];  Service: Endoscopy;  Laterality: N/A;  . EUS N/A 09/11/2014   Procedure: ESOPHAGEAL ENDOSCOPIC ULTRASOUND (EUS) RADIAL;  Surgeon: Arta Silence, MD;  Location: [facility] ENDOSCOPY;  Service: Endoscopy;  Laterality: N/A;  . JOINT REPLACEMENT  2005   left  shoulder replacement   . ORIF CLAVICULAR FRACTURE Left 07/18/2014   Procedure: OPEN REDUCTION INTERNAL FIXATION (ORIF) LEFT CLAVICULAR FRACTURE;  Surgeon: Ninetta Lights, MD;  Location: [facility];  Service: Orthopedics;  Laterality: Left;  . SPYGLASS CHOLANGIOSCOPY N/A 09/11/2014   Procedure: SPYGLASS CHOLANGIOSCOPY;  Surgeon: Arta Silence, MD;  Location: [facility] ENDOSCOPY;  Service: Endoscopy;  Laterality: N/A;  . TONSILLECTOMY  as child  . TOTAL KNEE ARTHROPLASTY Left 03/10/2016   Procedure: LEFT TOTAL KNEE ARTHROPLASTY;  Surgeon: Ninetta Lights, MD;  Location: [facility];  Service: Orthopedics;  Laterality: Left;  [name] VENTRAL HERNIA REPAIR  06/08/2012   Procedure: LAPAROSCOPIC VENTRAL HERNIA;  Surgeon: Adin Hector, MD;  Location: [facility] ORS;  Service: General;  Laterality: Right;   Family History:  Family History  Problem Relation Age of Onset  . Heart disease Mother   . Cancer Father     lung   Family Psychiatric  History: Patient has a brother with unknown mental illness and has a daughter who is a grownup with the depression and anxiety and also takes medications Xanax.  Social History:  History  Alcohol Use  . 0.6 oz/week  . 1 Glasses of wine per week    Comment: rare     History  Drug Use  . Types: Benzodiazepines    Social History   Social History  . Marital status: Married  Spouse name: N/A  . Number of children: N/A  . Years of education: N/A   Social History Main Topics  . Smoking status: Never Smoker  . Smokeless tobacco: Never Used  . Alcohol use 0.6 oz/week    1 Glasses of wine per week     Comment: rare  . Drug use:     Types: Benzodiazepines  . Sexual activity: Not Currently   Other Topics Concern  . None   Social History Narrative  . None   Additional Social History:    Allergies:   Allergies  Allergen Reactions  . Tetanus Toxoids Swelling  . Yellow Dyes (Non-Tartrazine) Itching and Other (See Comments)    Makes patient nervous      Labs:  Results for orders placed or performed during the hospital encounter of 11/10/16 (from the past 48 hour(s))  CBC     Status: Abnormal   Collection Time: 11/14/16  2:20 AM  Result Value Ref Range   WBC 11.7 (H) 4.0 - 10.5 K/uL   RBC 3.65 (L) 3.87 - 5.11 MIL/uL   Hemoglobin 10.9 (L) 12.0 - 15.0 g/dL   HCT 33.3 (L) 36.0 - 46.0 %   MCV 91.2 78.0 - 100.0 fL   MCH 29.9 26.0 - 34.0 pg   MCHC 32.7 30.0 - 36.0 g/dL   RDW 14.2 11.5 - 15.5 %   Platelets 201 150 - 400 K/uL  Basic metabolic panel     Status: Abnormal   Collection Time: 11/14/16  2:20 AM  Result Value Ref Range   Sodium 140 135 - 145 mmol/L    Comment: DELTA CHECK NOTED   Potassium 3.4 (L) 3.5 - 5.1 mmol/L   Chloride 110 101 - 111 mmol/L   CO2 22 22 - 32 mmol/L   Glucose, Bld 130 (H) 65 - 99 mg/dL   BUN 11 6 - 20 mg/dL   Creatinine, Ser 0.86 0.44 - 1.00 mg/dL   Calcium 8.2 (L) 8.9 - 10.3 mg/dL   GFR calc non Af Amer >60 >60 mL/min   GFR calc Af Amer >60 >60 mL/min    Comment: (NOTE) The eGFR has been calculated using the CKD EPI equation. This calculation has not been validated in all clinical situations. eGFR's persistently <60 mL/min signify possible Chronic Kidney Disease.    Anion gap 8 5 - 15  Glucose, capillary     Status: Abnormal   Collection Time: 11/14/16  6:55 AM  Result Value Ref Range   Glucose-Capillary 127 (H) 65 - 99 mg/dL  Glucose, capillary     Status: Abnormal   Collection Time: 11/15/16  6:34 AM  Result Value Ref Range   Glucose-Capillary 146 (H) 65 - 99 mg/dL   Comment 1 Notify RN    Comment 2 Document in Chart     Current Facility-Administered Medications  Medication Dose Route Frequency Provider Last Rate Last Dose  .  stroke: mapping our early stages of recovery book   Does not apply Once Ivor Costa, MD      . acetaminophen (TYLENOL) tablet 650 mg  650 mg Oral Q4H PRN Ivor Costa, MD   650 mg at 11/14/16 0217   Or  . acetaminophen (TYLENOL) suppository 650 mg  650 mg Rectal Q4H  PRN Ivor Costa, MD   650 mg at 11/12/16 1610  . acyclovir (ZOVIRAX) 675 mg in dextrose 5 % 100 mL IVPB  10 mg/kg Intravenous Q8H Bonnielee Haff, MD   675 mg at 11/15/16 0607  .  aspirin tablet 325 mg  325 mg Oral Daily Ivor Costa, MD   325 mg at 11/15/16 1053  . atenolol (TENORMIN) tablet 100 mg  100 mg Oral q morning - 10a Bonnielee Haff, MD   100 mg at 11/15/16 1053  . dextrose 5 % and 0.45 % NaCl with KCl 20 mEq/L infusion   Intravenous Continuous Bonnielee Haff, MD 50 mL/hr at 11/15/16 0449    . diltiazem (CARDIZEM CD) 24 hr capsule 180 mg  180 mg Oral Daily Bonnielee Haff, MD   180 mg at 11/15/16 1053  . hydrALAZINE (APRESOLINE) injection 5 mg  5 mg Intravenous Q2H PRN Ivor Costa, MD   5 mg at 11/15/16 0607  . levETIRAcetam (KEPPRA) tablet 500 mg  500 mg Oral BID Bonnielee Haff, MD   500 mg at 11/15/16 1053  . levothyroxine (SYNTHROID, LEVOTHROID) tablet 75 mcg  75 mcg Oral QAC breakfast Bonnielee Haff, MD   75 mcg at 11/15/16 0801  . lidocaine (PF) (XYLOCAINE) 1 % injection 5 mL  5 mL Intradermal Once Bonnielee Haff, MD      . lip balm (BLISTEX) ointment   Topical PRN Bonnielee Haff, MD      . LORazepam (ATIVAN) injection 1 mg  1 mg Intravenous Q2H PRN Ivor Costa, MD   1 mg at 11/12/16 0210  . ondansetron (ZOFRAN) injection 4 mg  4 mg Intravenous Q6H PRN Bonnielee Haff, MD   4 mg at 11/14/16 0342  . pantoprazole (PROTONIX) EC tablet 40 mg  40 mg Oral Daily Bonnielee Haff, MD   40 mg at 11/15/16 1053  . saccharomyces boulardii (FLORASTOR) capsule 250 mg  250 mg Oral BID Bonnielee Haff, MD   250 mg at 11/15/16 1053  . senna-docusate (Senokot-S) tablet 1 tablet  1 tablet Oral QHS PRN Ivor Costa, MD      . sodium chloride flush (NS) 0.9 % injection 10-40 mL  10-40 mL Intracatheter PRN Bonnielee Haff, MD   10 mL at 11/14/16 0447  . sodium phosphate (FLEET) 7-19 GM/118ML enema 1 enema  1 enema Rectal Once Bonnielee Haff, MD      . thiamine (VITAMIN B-1) tablet 100 mg  100 mg Oral Daily Charles Stewart    100 mg at 11/15/16 1053    Musculoskeletal: Strength & Muscle Tone: decreased Gait & Station: unable to stand Patient leans: N/A  Psychiatric Specialty Exam: Physical Exam as per history and physical   ROS complaining about depression, anxiety, auditory/visual hallucinations, delusions and paranoia. Patients has status post seizure episodes and initially required loading with Keppra.  No Fever-chills, No Headache, No changes with Vision or hearing, reports vertigo No problems swallowing food or Liquids, No Chest pain, Cough or Shortness of Breath, No Abdominal pain, No Nausea or Vommitting, Bowel movements are regular, No Blood in stool or Urine, No dysuria, No new skin rashes or bruises, No new joints pains-aches,  No new weakness, tingling, numbness in any extremity, No recent weight gain or loss, No polyuria, polydypsia or polyphagia,   A full 10 point Review of Systems was done, except as stated above, all other Review of Systems were negative.  Blood pressure (!) 181/77, pulse 99, temperature 98.3 F (36.8 C), temperature source Oral, resp. rate 20, height 5' 1"  (1.549 m), weight 67.7 kg (149 lb 4 oz), SpO2 96 %.Body mass index is 28.2 kg/m.  General Appearance: Bizarre and Guarded  Eye Contact:  Fair  Speech:  Blocked, Slow and Slurred  Volume:  Decreased  Mood:  Anxious, Depressed and Worthless  Affect:  Constricted and Depressed  Thought Process:  Disorganized and Irrelevant  Orientation:  Full (Time, Place, and Person)  Thought Content:  Delusions, Hallucinations: Auditory Visual, Rumination and Tangential  Suicidal Thoughts:  No  Homicidal Thoughts:  No  Memory:  Immediate;   Fair Recent;   Poor Remote;   Fair  Judgement:  Impaired  Insight:  Fair  Psychomotor Activity:  Restlessness  Concentration:  Concentration: Fair and Attention Span: Poor  Recall:  Poor  Fund of Knowledge:  Fair  Language:  Good  Akathisia:  Negative  Handed:  Right  AIMS (if  indicated):     Assets:  Communication Skills Desire for Improvement Financial Resources/Insurance Housing Intimacy Leisure Time Resilience Social Support Transportation  ADL's:  Impaired  Cognition:  Impaired,  Mild  Sleep:        Treatment Plan Summary: 80 years old female with a history of chronic mental illness manic depression, anxiety and also possibly abuse /dependence on Xanax and alcohol presented with possibly withdrawal seizures required loading up with Keppra on admission. Patient does not have a history of seizure disorder  and further neurological examination to be patient has no evidence of stroke or seizures.    Case discussed with Dr. [place] Pink and patient husband Reviewed neurology consultation and appreciated findings and recommendations Will start Seroquel 25 mg twice daily for hallucinations We start clonazepam 0.5 mg twice daily for possible withdrawal from benzodiazepines Will restart Effexor XR 75 mg daily for depression and anxiety We will restart gabapentin 400 mg 3 times daily for anxiety Recommend acute psychiatric hospitalization when medically stable as patient cannot care for herself due to excessive symptoms of anxiety and psychosis.  Daily contact with patient to assess and evaluate symptoms and progress in treatment and Medication management Appreciate psychiatric consultation and follow up as clinically required Please contact [phone] or [phone] if needs further assistance  Disposition: Patient does not recover from current medication management benefit from acute geriatric psychiatric hospitalization. Referred to the unit social service for possible placement Recommend psychiatric Inpatient admission when medically cleared. Supportive therapy provided about ongoing stressors.  Ambrose Finland, MD 11/15/2016 11:21 AM

## 2016-11-15 NOTE — Care Management Important Message (Signed)
Important Message  Patient Details  Name: [name] [name] MRN: [id number] Date of Birth: 12/28/1935   Medicare Important Message Given:  Yes    [name] 11/15/2016, 11:02 AM

## 2016-11-15 NOTE — Progress Notes (Signed)
Nutrition Follow-up  INTERVENTION:  Ensure Enlive po BID, each supplement provides 350 kcal and 20 grams of protein Magic cup BID with meals/meds, each supplement provides 290 kcal and 9 grams of protein Multivitamin with minerals daily   NUTRITION DIAGNOSIS:   Inadequate oral intake related to lethargy/confusion as evidenced by meal completion < 25%.  ongoing  GOAL:   Patient will meet greater than or equal to 90% of their needs  Unmet  MONITOR:   Supplement acceptance, Skin, I & O's, Labs, Weight trends  REASON FOR ASSESSMENT:   Malnutrition Screening Tool    ASSESSMENT:   80 year old Caucasian female with a past medical history of hypertension, hyperlipidemia, GERD, hypothyroidism, history of stroke, presented from home with complaints of altered mental status and seizure activity.   RN reports that patient has been very confused and refusing meals; pt drank an Ensure yesterday, but is refusing all food and meds today. RD visited patient and patient was talking about being punished, not deserving to get better, and seeing bad tapes play. She reports having no appetite. She states that she doesn't like the Ensure. Per husband at bedside, pt doesn't typically eat much and is a picky eater, but has maintained her usual weight of 135 lbs. RD offered a variety of foods and assessed pt's usual intake. RD discussed the importance of nutrition and encouraged patient to try to eat at each meal.   Labs: low calcium, low hemoglobin, low potassium  Diet Order:  Diet heart healthy/carb modified Room service appropriate? Yes; Fluid consistency: Thin  Skin:  Reviewed, no issues  Last BM:  12/3  Height:   Ht Readings from Last 1 Encounters:  11/10/16 5\' 1"  (1.549 m)    Weight:   Wt Readings from Last 1 Encounters:  11/10/16 149 lb 4 oz (67.7 kg)    Ideal Body Weight:  47.7 kg  BMI:  Body mass index is 28.2 kg/m.  Estimated Nutritional Needs:   Kcal:   1500-1700  Protein:  70-80 grams  Fluid:  1.6-1.8 L/day  EDUCATION NEEDS:   No education needs identified at this time  Scarlette Ar RD, CSP, LDN Inpatient Clinical Dietitian Pager: [phone] After Hours Pager: [phone]

## 2016-11-16 DIAGNOSIS — F13931 Sedative, hypnotic or anxiolytic use, unspecified with withdrawal delirium: Secondary | ICD-10-CM

## 2016-11-16 LAB — CSF CULTURE: CULTURE: NO GROWTH

## 2016-11-16 LAB — FUNGUS STAIN

## 2016-11-16 LAB — CBC
HCT: 34.3 % — ABNORMAL LOW (ref 36.0–46.0)
HEMOGLOBIN: 11 g/dL — AB (ref 12.0–15.0)
MCH: 29.8 pg (ref 26.0–34.0)
MCHC: 32.1 g/dL (ref 30.0–36.0)
MCV: 93 fL (ref 78.0–100.0)
PLATELETS: 196 10*3/uL (ref 150–400)
RBC: 3.69 MIL/uL — ABNORMAL LOW (ref 3.87–5.11)
RDW: 14.6 % (ref 11.5–15.5)
WBC: 9.9 10*3/uL (ref 4.0–10.5)

## 2016-11-16 LAB — CULTURE, BLOOD (ROUTINE X 2)
CULTURE: NO GROWTH
Culture: NO GROWTH

## 2016-11-16 LAB — BASIC METABOLIC PANEL
Anion gap: 7 (ref 5–15)
BUN: 8 mg/dL (ref 6–20)
CALCIUM: 8.9 mg/dL (ref 8.9–10.3)
CHLORIDE: 110 mmol/L (ref 101–111)
CO2: 25 mmol/L (ref 22–32)
CREATININE: 0.88 mg/dL (ref 0.44–1.00)
GFR calc non Af Amer: >60 mL/min (ref >60)
GLUCOSE: 124 mg/dL — AB (ref 65–99)
Potassium: 3.8 mmol/L (ref 3.5–5.1)
Sodium: 142 mmol/L (ref 135–145)

## 2016-11-16 LAB — FUNGAL STAIN REFLEX

## 2016-11-16 LAB — GLUCOSE, CAPILLARY: GLUCOSE-CAPILLARY: 110 mg/dL — AB (ref 65–99)

## 2016-11-16 LAB — CSF CULTURE W GRAM STAIN

## 2016-11-16 MED ORDER — SODIUM CHLORIDE 0.9 % IV SOLN
INTRAVENOUS | Status: DC
  Administered 2016-11-16: 17:00:00 via INTRAVENOUS

## 2016-11-16 MED ORDER — CLONAZEPAM 0.5 MG PO TABS
0.5000 mg | ORAL_TABLET | Freq: Once | ORAL | Status: AC
  Administered 2016-11-16: 0.5 mg via ORAL
  Filled 2016-11-16: qty 1

## 2016-11-16 NOTE — Progress Notes (Signed)
Speech Language Pathology Treatment: Cognitive-Linquistic  Patient Details Name: [name] [name] MRN: [id number] DOB: 1936/03/16 Today's Date: 11/16/2016 Time: [id number] SLP Time Calculation (min) (ACUTE ONLY): 18 min  Assessment / Plan / Recommendation Clinical Impression  Treatment focused on cognition primarily memory/organization techniques. SLP educated pt on strategies including continuing to write information on calendars and put in same location, use of an am/pm pill box to fill weekly to keep track of medications taken daily. Per chart plan is for SNF for continued rehab. She may benefit from ST eval and treat (home health) once back home.    HPI HPI: Pt is a 80 year old Caucasian female with a past medical history of hypertension, hyperlipidemia, GERD, hypothyroidism, history of stroke, presented from home with complaints of altered mental status and seizure activity. History was provided by the patient's husband. She does not have any known history of seizure disorder. Patient was found to have elevated blood pressure. She was seen by neurology. Hospitalized for further management. Patient became more encephalopathic. She had more episodes of seizures and became febrile. According to patient's family, she abuses xanax and consumes alcohol at home. It is unclear how much she drinks. I was told at the time of initial assessment that it was 2 glasses of wines per day, but apparently it's more than that. Etiology for delirium appears to be multifactorial including drug withdrawal, alcohol withdrawal, seizures.      SLP Plan  Continue with current plan of care     Recommendations                   Oral Care Recommendations: Oral care BID Follow up Recommendations: 24 hour supervision/assistance Plan: Continue with current plan of care       GO                [name] [name] 11/16/2016, 3:05 PM  [name] [name] Caroli.[employer] [phone]

## 2016-11-16 NOTE — Consult Note (Signed)
[facility] CM Primary Care Navigator  11/16/2016  [name] [name] June 12, 1936 [id number]   Met with patient at the bedside to identify possible discharge needs. Patient reports that "confusion and hallucinations" had led to this admission.  Patient states that she had Dr. Louie Casa for 30 years as a primary care provider but had been assigned to Dr. Carney Living with [facility] as her primary care provider.   Patient shared using [facility] to obtain medications without difficulty at present.   She reports managing her own medications at home straight out of the containers and mentioned having "some trouble keeping up with it". She was encouraged to use a "pill box" to help her organize and she stated to use it when she gets home.  Husband [name]) will be providing transportation to her doctors' appointments after discharge.  Her husband will be her primary caregiver at home as stated.  Discharge plan per MD is await for further improvement in mental status and will likely need to go to skilled nursing facility for rehabilitation.  Patient voiced understanding to call primary care provider's office when she gets home, for a post discharge follow-up appointment within a week or sooner if needed. Patient letter provided as a reminder.  Patient denies any care needs or concerns at this time. [facility] care management contact information provided if future needs arise.  For additional questions please contact:  Edwena Felty A. Aasiya Creasey, BSN, RN-BC [facility] PRIMARY CARE Navigator Cell: [phone]

## 2016-11-16 NOTE — Progress Notes (Signed)
[facility] PROGRESS NOTE  SEVEN CHALLA [id number] DOB: [birth year] DOA: 11/10/2016  PCP: Birdie Sons, MD (Inactive)  Brief History/Interval Summary: 80 year old Caucasian female with a past medical history of hypertension, hyperlipidemia, GERD, hypothyroidism, history of stroke, presented from home with complaints of altered mental status and seizure activity. History was provided by the patient's husband. She does not have any known history of seizure disorder. Patient was found to have elevated blood pressure. She was seen by neurology. Hospitalized for further management. Patient became more encephalopathic. She had more episodes of seizures and became febrile. Patient underwent lumbar puncture. CSF was inconclusive for infection. Patient's mental status started improving. Psychiatry was also consulted to assist with management.  Reason for Visit: Seizure. Withdrawal from benzodiazepines and alcohol.  Consultants: Neurology. Psychiatry  Procedures:  EEG "Impression: This is a mildly abnormal EEG due to mild intermixed slowing. This is suggestive of a mild encephalopathy but is nonspecific as to etiology. No evidence of seizure. Tremors are noted but have no electrical correlate. "  Lumbar puncture under fluoroscopy  Transthoracic echocardiogram Study Conclusions  - Procedure narrative: Transthoracic echocardiography. Image   quality was adequate. The study was technically difficult. - Left ventricle: The cavity size was normal. There was moderate   concentric hypertrophy. Systolic function was normal. The   estimated ejection fraction was in the range of 60% to 65%. Wall   motion was normal; there were no regional wall motion   abnormalities. Features are consistent with a pseudonormal left   ventricular filling pattern, with concomitant abnormal relaxation   and increased filling pressure (grade 2 diastolic dysfunction). - Aortic valve: There was trivial  regurgitation. - Mitral valve: Calcified annulus. Mildly thickened leaflets .   There was mild regurgitation. - Left atrium: The atrium was mildly dilated. - Pulmonary arteries: Systolic pressure was mildly increased. PA   peak pressure: 37 mm Hg (S).  Antibiotics: Started on Unasyn 11/30. Changed over to ceftriaxone, vancomycin and ampicillin 12/1 for presumed CNS infection. Acyclovir added 12/2 Ceftriaxone, vancomycin and ampicillin discontinued 12/3  Subjective/Interval History: Patient states that she feels better this morning. Denies any nausea, vomiting. Denies any abdominal pain.  ROS: Denies any chest pain or shortness of breath  Objective:  Vital Signs  Vitals:   11/15/16 2350 11/16/16 0107 11/16/16 0531 11/16/16 0938  BP: (!) 167/65 (!) 175/66 (!) 176/70 (!) 162/58  Pulse:  88 82 95  Resp:  18 18 18   Temp:  99 F (37.2 C) 98.8 F (37.1 C) 98.6 F (37 C)  TempSrc:  Oral Oral Oral  SpO2:  98% 98% 100%  Weight:      Height:        Intake/Output Summary (Last 24 hours) at 11/16/16 1259 Last data filed at 11/16/16 1000  Gross per 24 hour  Intake              320 ml  Output              525 ml  Net             -205 ml   Filed Weights   11/10/16 2215  Weight: 67.7 kg (149 lb 4 oz)    General appearance: Awake, alert, distracted Resp: Diminished air entry at the bases. Crackles or wheezing. No rhonchi. Cardio: regular rate and rhythm, S1, S2 normal, no murmur, click, rub or gallop GI: Abdomen is soft, nondistended. Bowel sounds present. No masses or organomegaly otherwise. Neurologic: Awake, alert. Oriented to  place, month. Cannot tell me the year. Could tell me the president's name. Moving all her extremities. Tremors have improved.  Lab Results:  Data Reviewed: I have personally reviewed following labs and imaging studies  CBC:  Recent Labs Lab 11/10/16 2152  11/11/16 1217 11/12/16 0530 11/13/16 0522 11/14/16 0220 11/16/16 0556  WBC 7.9  --  9.6  14.3* 9.4 11.7* 9.9  NEUTROABS 5.4  --   --   --   --   --   --   HGB 12.6  < > 12.1 11.9* 10.7* 10.9* 11.0*  HCT 37.7  < > 36.0 35.9* 33.8* 33.3* 34.3*  MCV 89.1  --  88.2 89.8 93.6 91.2 93.0  PLT 182  --  226 232 170 201 196  < > = values in this interval not displayed.  Basic Metabolic Panel:  Recent Labs Lab 11/11/16 0200 11/11/16 1217 11/12/16 0530 11/13/16 0522 11/14/16 0220 11/16/16 1028  NA  --  141 145 148* 140 142  K  --  3.0* 3.0* 3.1* 3.4* 3.8  CL  --  104 108 115* 110 110  CO2  --  22 24 22 22 25   GLUCOSE  --  132* 146* 107* 130* 124*  BUN  --  13 11 13 11 8   CREATININE  --  0.97 1.03* 0.89 0.86 0.88  CALCIUM  --  8.6* 8.7* 8.1* 8.2* 8.9  MG 1.6*  --   --  2.3  --   --     GFR: Estimated Creatinine Clearance: 46.4 mL/min (by C-G formula based on SCr of 0.88 mg/dL).  Liver Function Tests:  Recent Labs Lab 11/10/16 2152 11/11/16 1217 11/12/16 0530 11/13/16 0522  AST 26 25 25 18   ALT 19 19 17 15   ALKPHOS 95 84 83 68  BILITOT 0.8 1.3* 0.9 0.6  PROT 6.3* 6.2* 6.3* 5.0*  ALBUMIN 3.3* 3.4* 3.3* 2.5*    Coagulation Profile:  Recent Labs Lab 11/10/16 2152 11/10/16 2348  INR 1.01 1.01    CBG:  Recent Labs Lab 11/12/16 0626 11/13/16 0658 11/14/16 0655 11/15/16 0634 11/16/16 0630  GLUCAP 146* 114* 127* 146* 110*     Radiology Studies: No results found.   Medications:  Scheduled: .  stroke: mapping our early stages of recovery book   Does not apply Once  . aspirin  325 mg Oral Daily  . atenolol  100 mg Oral q morning - 10a  . clonazePAM  0.5 mg Oral BID  . diltiazem  180 mg Oral Daily  . feeding supplement (ENSURE ENLIVE)  237 mL Oral BID BM  . gabapentin  400 mg Oral TID  . levETIRAcetam  500 mg Oral BID  . levothyroxine  75 mcg Oral QAC breakfast  . lidocaine (PF)  5 mL Intradermal Once  . pantoprazole  40 mg Oral Daily  . QUEtiapine  25 mg Oral QHS  . saccharomyces boulardii  250 mg Oral BID  . sodium phosphate  1 enema  Rectal Once  . [START ON 11/18/2016] thiamine injection  100 mg Intravenous Daily  . thiamine injection  500 mg Intravenous TID  . venlafaxine XR  75 mg Oral Q breakfast   Continuous: . dextrose 5 % and 0.45 % NaCl with KCl 20 mEq/L 50 mL/hr at 11/16/16 0607   [id number] **OR** acetaminophen, hydrALAZINE, lip balm, LORazepam, ondansetron (ZOFRAN) IV, senna-docusate, sodium chloride flush  Assessment/Plan:  Principal Problem:   Alcohol withdrawal delirium (HCC) Active Problems:   GERD (gastroesophageal reflux  disease)   Anxiety   Hypokalemia   Acute encephalopathy   Chronic diastolic CHF (congestive heart failure) (HCC)   Hypothyroidism   Hypertensive urgency   Altered mental state   Seizure ([place])   Fever   Cerebrovascular disease   Paroxysmal atrial fibrillation (HCC)   Sedative, hypnotic, or anxiolytic withdrawal delirium ([place])     Acute encephalopathy, Acute delirium/possible withdrawal Apparently, according to patient's family, she abuses xanax and consumes alcohol at home. It is unclear how much she drinks. I was told that she drinks 2-4 glasses of wines per day, but apparently it's more than that. Etiology for delirium appears to be multifactorial including drug withdrawal, alcohol withdrawal, seizures. She also developed fever. Initial x-rays suggested pneumonitis. However, subsequent x-ray did not show any acute abnormalities. LP was done 12/1. CT head was unremarkable. MRI brain does not show any stroke or any other concerning findings. EEG is as above. Neurology is following. Patient has made bizarre statements. See neurology note from 12/3. Psychiatry was consulted. Appreciate their assistance. Patient started on Klonopin, Seroquel. Restarted on her Effexor.  Paroxysmal Atrial Fibrillation Patient went into rapid atrial fibrillation on 12/2. Patient does not have any history of same. Most likely due to acute illness. TSH was normal. Echocardiogram has been  ordered. Patient was already on a beta blocker. Patient was given oral Cardizem. She then converted back to sinus rhythm within a few hours. Echocardiogram does show evidence for diastolic dysfunction. Chads2vasc score appears to be 5. However, patient does have a significant history of alcohol abuse. The risks of putting her on oral anticoagulation at this time is high. Her HAS-BLED is 3 with an estimated risk of major bleeding with one year of oral anticoagulation to be 4.9-19.6%. We will place her just on aspirin for now. Outpatient follow-up with primary care to determine if she would be a candidate for oral anticoagulation in the near future. But she will need to stop abusing alcohol.  Fever/possible viral meningitis See above. Could also be due to the withdrawal process. Blood cultures negative so far. Lumbar puncture was done. CSF does show few WBCs. Glucose is normal. Protein is mildly elevated. Not conclusive for bacterial meningitis. Could be suggestive of viral meningitis. Gram stain did not show any organisms. Patient was placed on ceftriaxone, vancomycin and ampicillin. Acyclovir was added. CSF cultures are all negative so far. Antibiotics were stopped yesterday. Acyclovir is being continued. HSV appears to be negative. VDRL is negative as well.   Seizures possibly due to drug withdrawal. No known history of same. Seizures are most likely due to withdrawal from benzodiazepine and alcohol. Patient remains on [place] for now. EEG as above. MRI as mentioned below. Neurology is following.   Nausea and vomiting Appears to have resolved. Abdominal film did not show any obstruction. Evidence for mild fecal impaction. Given an enema. Abdomen remains benign. Continue to monitor for now.  Accelerated hypertension Blood pressure remains high, but improved compared to the time of admission. Continue hydralazine as needed. Dose of atenolol was increased. Continue Cardizem. May need to increase the dose of  Cardizem as well. Some of the elevation of blood pressures is due to withdrawal. Continue to monitor for now.   Dehydration with mildly elevated creatinine/mild acute renal failure Creatinine is improved. Patient is now taking orally. Okay to stop IV fluids  Chronic systolic congestive heart failure EchoCardiogram as above. Grade 2 diastolic dysfunction noted. She is well compensated. Blood pressure control.  Hyperlipidemia  Continuing home  medications when able to take orally  GERD: Protonix  Depression and anxiety: Appreciate psychiatry assistance. Patient restarted on psychotropic medications. Depending on how she does over the next few days, she may be referred to the geriatric inpatient psychiatric center. Patient started on Klonopin, Seroquel. Restarted on her Effexor.  Hypokalemia and hypomagnesemia Calcium is improved this morning.  Hypothyroidism TSH is normal. Continue Synthroid   DVT Prophylaxis: Lovenox   Code Status: Full code  Family Communication: No family at bedside.  Disposition Plan: Management as outlined above. Await further improvement in mental status. Will likely need to go to skilled nursing facility for rehabilitation. Wait for withdrawal symptoms to improve. May need inpatient psychiatric care.    LOS: 5 days   [facility] Pager [phone] 11/16/2016, 12:59 PM  If 7PM-7AM, please contact night-coverage at [email or web address], password [facility]

## 2016-11-16 NOTE — Progress Notes (Signed)
Subjective: Patient is feeling much more calm today after receiving both her Effexor and 0.5 mg of clonazepam today. Patient admits that she does not want to be on benzodiazepines and in addition would like to stay away from drinking.  Exam: Vitals:   11/16/16 0531 11/16/16 0938  BP: (!) 176/70 (!) 162/58  Pulse: 82 95  Resp: 18 18  Temp: 98.8 F (37.1 C) 98.6 F (37 C)        Gen: In bed, NAD MS: She is alert and oriented follows all commands speech is clear CN: Cranial nerves II through XII are intact Motor: Mild tremor when hands are held out straight but much improved Sensory: Sensory grossly intact   Pertinent Labs/Diagnostics: None  [name] PA-C [facility] [phone]  I discussed with her husband is at bedside. He states that she seems like she is essentially at baseline.  Given her improvement with radiation of the benzodiazepines and Effexor, I think that this hospitalization is predominantly been related to alcohol/benzodiazepine withdrawal. I discussed with both her and her husband that the pleocytosis on LP is unusual in the setting, but that seizure(including withdrawal seizures) can sometimes cause a mild pleocytosis. Also possible is that this represented a mild aseptic meningitis which contributed to a multifactorial delirium, but with a negative HSV, no specific treatment remains for this.  Impression:  1) slow benzodiazepine taper 2) can discontinue Keppra 3) no further recommendations at this time, neurology to sign off. Please call with any further questions or concerns.  Roland Rack, MD [facility] [phone]  If 7pm- 7am, please page neurology on call as listed in [place].

## 2016-11-17 DIAGNOSIS — F10231 Alcohol dependence with withdrawal delirium: Principal | ICD-10-CM

## 2016-11-17 LAB — ANAEROBIC CULTURE

## 2016-11-17 LAB — CBC
HEMATOCRIT: 32.1 % — AB (ref 36.0–46.0)
HEMOGLOBIN: 10.3 g/dL — AB (ref 12.0–15.0)
MCH: 29.9 pg (ref 26.0–34.0)
MCHC: 32.1 g/dL (ref 30.0–36.0)
MCV: 93.3 fL (ref 78.0–100.0)
Platelets: 179 10*3/uL (ref 150–400)
RBC: 3.44 MIL/uL — ABNORMAL LOW (ref 3.87–5.11)
RDW: 14.5 % (ref 11.5–15.5)
WBC: 13.2 10*3/uL — AB (ref 4.0–10.5)

## 2016-11-17 LAB — BASIC METABOLIC PANEL
ANION GAP: 5 (ref 5–15)
BUN: 14 mg/dL (ref 6–20)
CALCIUM: 8.4 mg/dL — AB (ref 8.9–10.3)
CHLORIDE: 112 mmol/L — AB (ref 101–111)
CO2: 24 mmol/L (ref 22–32)
Creatinine, Ser: 0.82 mg/dL (ref 0.44–1.00)
GFR calc non Af Amer: >60 mL/min (ref >60)
Glucose, Bld: 102 mg/dL — ABNORMAL HIGH (ref 65–99)
POTASSIUM: 3.7 mmol/L (ref 3.5–5.1)
Sodium: 141 mmol/L (ref 135–145)

## 2016-11-17 LAB — GLUCOSE, CAPILLARY: Glucose-Capillary: 110 mg/dL — ABNORMAL HIGH (ref 65–99)

## 2016-11-17 MED ORDER — VITAMIN B-1 100 MG PO TABS: 100.0000 mg | ORAL_TABLET | Freq: Every day | ORAL | Status: DC

## 2016-11-17 MED ORDER — AMLODIPINE BESYLATE 2.5 MG PO TABS
2.5000 mg | ORAL_TABLET | Freq: Every day | ORAL | Status: DC
  Administered 2016-11-17 – 2016-11-18 (×2): 2.5 mg via ORAL
  Filled 2016-11-17 (×2): qty 1

## 2016-11-17 NOTE — NC FL2 (Signed)
[place] LEVEL OF CARE SCREENING TOOL     IDENTIFICATION  Patient Name: [name] [name] Birthdate: 1936/06/21 Sex: female Admission Date (Current Location): 11/10/2016  [facility] and [place] Number:  Herbalist and Address:  [facility], [place] [address], [place], [place] [address]      Provider Number: [id number]  Attending Physician Name and Address:  Elmarie Shiley, MD  Relative Name and Phone Number:  [place];  [phone]     Current Level of Care: SNF Recommended Level of Care: [place] Prior Approval Number:    Date Approved/Denied:   PASRR Number:  (submitted for PASRR 11/17/16 - MUST [id number])  Discharge Plan: SNF    Current Diagnoses: Patient Active Problem List   Diagnosis Date Noted  . Sedative, hypnotic, or anxiolytic withdrawal delirium ([place]) 11/15/2016  . Paroxysmal atrial fibrillation (HCC)   . Cerebrovascular disease   . Alcohol withdrawal delirium ([place])   . Fever   . Acute encephalopathy 11/10/2016  . Hypothyroidism 11/10/2016  . Hypertensive urgency 11/10/2016  . Altered mental state 11/10/2016  . Seizure ([place]) 11/10/2016  . Chronic diastolic CHF (congestive heart failure) ([place])   . Left carotid stenosis 10/29/2016  . S/P total knee replacement 03/10/2016  . 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 Ileus ([place]) 09/05/2014  . Leukocytosis, unspecified 09/05/2014  . Vomiting 09/04/2014  . Pulmonary edema 09/04/2014  . Hypokalemia 08/28/2014  . Enteritis due to Clostridium difficile 08/27/2014  . 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 Protein-calorie malnutrition, severe ([place]) 08/24/2014  . Obstructive jaundice 08/23/2014  . Acute cholangitis 08/23/2014  . ARF (acute renal failure) ([place]) 08/23/2014  . Coagulopathy ([place]) 08/23/2014  . Back pain   . Arthritis   . Hypertension   . GERD (gastroesophageal reflux disease)   . Anxiety   . IBS (irritable  bowel syndrome)   . Hernia of Right posterior flank 04/18/2012    Orientation RESPIRATION BLADDER Height & Weight     Self, Time, Situation, Place  Normal Incontinent Weight: 149 lb 4 oz (67.7 kg) Height:  5\' 1"  (154.9 cm)  BEHAVIORAL SYMPTOMS/MOOD NEUROLOGICAL BOWEL NUTRITION STATUS    Convulsions/Seizures (History of seizures) Incontinent Diet (Heart healthy-carb modified)  AMBULATORY STATUS COMMUNICATION OF NEEDS Skin   Limited Assist Verbally Other (Comment) (Ecchymosis: face, breast, back, knee, flank, hip, arm, cervical, ankle, leg. Excoriated arm and buttocks)                       Personal Care Assistance Level of Assistance  Bathing, Feeding, Dressing Bathing Assistance: Maximum assistance Feeding assistance: Independent (Assistance with set-up) Dressing Assistance: Maximum assistance     Functional Limitations Info  Sight, Hearing, Speech Sight Info: Adequate Hearing Info: Adequate Speech Info: Impaired (Patient HOH at times)    Mobile  PT (By licensed PT), OT (By licensed OT)     PT Frequency: Evaluated 12/3 and a minimum of 3X per week therapy recommended OT Frequency: Evaluated 12/3 and a minimum of 2X per week therapy recommended            Contractures Contractures Info: Not present    Additional Factors Info  Code Status, Allergies Code Status Info: Full Allergies Info: Tetanus Toxoids, Yellow Dyes (Non-Tartrazine)           Current Medications (11/17/2016):  This is the current hospital active medication list Current Facility-Administered Medications  Medication Dose  Route Frequency Provider Last Rate Last Dose  .  stroke: mapping our early stages of recovery book   Does not apply Once Ivor Costa, MD      . 0.9 %  sodium chloride infusion   Intravenous Continuous Bonnielee Haff, MD 10 mL/hr at 11/16/16 1659    . acetaminophen (TYLENOL) tablet 650 mg  650 mg Oral Q4H PRN Ivor Costa, MD   650 mg at 11/16/16 1135   Or  .  acetaminophen (TYLENOL) suppository 650 mg  650 mg Rectal Q4H PRN Ivor Costa, MD   650 mg at 11/12/16 [id number]  . aspirin tablet 325 mg  325 mg Oral Daily Ivor Costa, MD   325 mg at 11/17/16 1028  . atenolol (TENORMIN) tablet 100 mg  100 mg Oral q morning - 10a Bonnielee Haff, MD   100 mg at 11/17/16 1027  . clonazePAM (KLONOPIN) tablet 0.5 mg  0.5 mg Oral BID Ambrose Finland, MD   0.5 mg at 11/17/16 1028  . diltiazem (CARDIZEM CD) 24 hr capsule 180 mg  180 mg Oral Daily Bonnielee Haff, MD   180 mg at 11/17/16 1027  . feeding supplement (ENSURE ENLIVE) (ENSURE ENLIVE) liquid 237 mL  237 mL Oral BID BM Bonnielee Haff, MD   237 mL at 11/17/16 1029  . gabapentin (NEURONTIN) capsule 400 mg  400 mg Oral TID Bonnielee Haff, MD   400 mg at 11/17/16 1028  . hydrALAZINE (APRESOLINE) injection 5 mg  5 mg Intravenous Q2H PRN Ivor Costa, MD   5 mg at 11/15/16 0607  . levothyroxine (SYNTHROID, LEVOTHROID) tablet 75 mcg  75 mcg Oral QAC breakfast Bonnielee Haff, MD   75 mcg at 11/17/16 0751  . lidocaine (PF) (XYLOCAINE) 1 % injection 5 mL  5 mL Intradermal Once Bonnielee Haff, MD      . lip balm (BLISTEX) ointment   Topical PRN Bonnielee Haff, MD      . LORazepam (ATIVAN) injection 1 mg  1 mg Intravenous Q2H PRN Ivor Costa, MD   1 mg at 11/16/16 1853  . ondansetron (ZOFRAN) injection 4 mg  4 mg Intravenous Q6H PRN Bonnielee Haff, MD   4 mg at 11/16/16 1234  . pantoprazole (PROTONIX) EC tablet 40 mg  40 mg Oral Daily Bonnielee Haff, MD   40 mg at 11/17/16 1029  . QUEtiapine (SEROQUEL) tablet 25 mg  25 mg Oral QHS Ambrose Finland, MD   25 mg at 11/16/16 2216  . saccharomyces boulardii (FLORASTOR) capsule 250 mg  250 mg Oral BID Bonnielee Haff, MD   250 mg at 11/17/16 1028  . senna-docusate (Senokot-S) tablet 1 tablet  1 tablet Oral QHS PRN Ivor Costa, MD      . sodium chloride flush (NS) 0.9 % injection 10-40 mL  10-40 mL Intracatheter PRN Bonnielee Haff, MD   10 mL at 11/14/16 0447  . sodium phosphate  (FLEET) 7-19 GM/118ML enema 1 enema  1 enema Rectal Once Bonnielee Haff, MD      . Derrill Memo ON 11/18/2016] thiamine (VITAMIN B-1) tablet 100 mg  100 mg Oral Daily Marliss Coots, PA-C      . venlafaxine XR (EFFEXOR-XR) 24 hr capsule 75 mg  75 mg Oral Q breakfast Ambrose Finland, MD   75 mg at 11/17/16 0750     Discharge Medications: Please see discharge summary for a list of discharge medications.  Relevant Imaging Results:  Relevant Lab Results:   Additional Information [phone].  Sable Feil, LCSW

## 2016-11-17 NOTE — Progress Notes (Signed)
[facility] PROGRESS NOTE  DARELY NOVEL [id number] DOB: [birth year] DOA: 11/10/2016  PCP: Birdie Sons, MD (Inactive)  Brief History/Interval Summary: 80 year old Caucasian female with a past medical history of hypertension, hyperlipidemia, GERD, hypothyroidism, history of stroke, presented from home with complaints of altered mental status and seizure activity. History was provided by the patient's husband. She does not have any known history of seizure disorder. Patient was found to have elevated blood pressure. She was seen by neurology. Hospitalized for further management. Patient became more encephalopathic. She had more episodes of seizures and became febrile. Patient underwent lumbar puncture. CSF was inconclusive for infection. Patient's mental status started improving. Psychiatry was also consulted to assist with management.  Reason for Visit: Seizure. Withdrawal from benzodiazepines and alcohol.  Consultants: Neurology. Psychiatry  Procedures:  EEG "Impression: This is a mildly abnormal EEG due to mild intermixed slowing. This is suggestive of a mild encephalopathy but is nonspecific as to etiology. No evidence of seizure. Tremors are noted but have no electrical correlate. "  Lumbar puncture under fluoroscopy  Transthoracic echocardiogram Study Conclusions  - Procedure narrative: Transthoracic echocardiography. Image   quality was adequate. The study was technically difficult. - Left ventricle: The cavity size was normal. There was moderate   concentric hypertrophy. Systolic function was normal. The   estimated ejection fraction was in the range of 60% to 65%. Wall   motion was normal; there were no regional wall motion   abnormalities. Features are consistent with a pseudonormal left   ventricular filling pattern, with concomitant abnormal relaxation   and increased filling pressure (grade 2 diastolic dysfunction). - Aortic valve: There was trivial  regurgitation. - Mitral valve: Calcified annulus. Mildly thickened leaflets .   There was mild regurgitation. - Left atrium: The atrium was mildly dilated. - Pulmonary arteries: Systolic pressure was mildly increased. PA   peak pressure: 37 mm Hg (S).  Antibiotics: Started on Unasyn 11/30. Changed over to ceftriaxone, vancomycin and ampicillin 12/1 for presumed CNS infection. Acyclovir added 12/2 Ceftriaxone, vancomycin and ampicillin discontinued 12/3  Subjective/Interval History: Report 2 BM, loose, has history of IBS.  She is tired, was not able to sleep last night.  Oriented, MS improving.   ROS: Denies any chest pain or shortness of breath  Objective:  Vital Signs  Vitals:   11/17/16 0056 11/17/16 0555 11/17/16 1001 11/17/16 1415  BP: (!) 133/53 (!) 144/59 (!) 180/66 (!) 165/65  Pulse: (!) 58 67 63 72  Resp: 18 18 18 16   Temp: 98.1 F (36.7 C) 98.5 F (36.9 C) 98.3 F (36.8 C) 98.5 F (36.9 C)  TempSrc: Axillary Oral Oral Oral  SpO2: 95% 94% 98% 97%  Weight:      Height:        Intake/Output Summary (Last 24 hours) at 11/17/16 1550 Last data filed at 11/17/16 1100  Gross per 24 hour  Intake              480 ml  Output                0 ml  Net              480 ml   Filed Weights   11/10/16 2215  Weight: 67.7 kg (149 lb 4 oz)    General appearance: Awake, alert, distracted Resp: Diminished air entry at the bases. Crackles or wheezing. No rhonchi. Cardio: regular rate and rhythm, S1, S2 normal, no murmur, click, rub or gallop GI: Abdomen is  soft, nondistended. Bowel sounds present. No masses or organomegaly otherwise. Neurologic: Awake, alert. Oriented to place, month. Cannot tell me the year. Could tell me the president's name. Moving all her extremities. Tremors have improved.  Lab Results:  Data Reviewed: I have personally reviewed following labs and imaging studies  CBC:  Recent Labs Lab 11/10/16 2152  11/12/16 0530 11/13/16 0522 11/14/16 0220  11/16/16 0556 11/17/16 0731  WBC 7.9  < > 14.3* 9.4 11.7* 9.9 13.2*  NEUTROABS 5.4  --   --   --   --   --   --   HGB 12.6  < > 11.9* 10.7* 10.9* 11.0* 10.3*  HCT 37.7  < > 35.9* 33.8* 33.3* 34.3* 32.1*  MCV 89.1  < > 89.8 93.6 91.2 93.0 93.3  PLT 182  < > 232 170 201 196 179  < > = values in this interval not displayed.  Basic Metabolic Panel:  Recent Labs Lab 11/11/16 0200  11/12/16 0530 11/13/16 0522 11/14/16 0220 11/16/16 1028 11/17/16 0731  NA  --   < > 145 148* 140 142 141  K  --   < > 3.0* 3.1* 3.4* 3.8 3.7  CL  --   < > 108 115* 110 110 112*  CO2  --   < > 24 22 22 25 24   GLUCOSE  --   < > 146* 107* 130* 124* 102*  BUN  --   < > 11 13 11 8 14   CREATININE  --   < > 1.03* 0.89 0.86 0.88 0.82  CALCIUM  --   < > 8.7* 8.1* 8.2* 8.9 8.4*  MG 1.6*  --   --  2.3  --   --   --   < > = values in this interval not displayed.  GFR: Estimated Creatinine Clearance: 49.8 mL/min (by C-G formula based on SCr of 0.82 mg/dL).  Liver Function Tests:  Recent Labs Lab 11/10/16 2152 11/11/16 1217 11/12/16 0530 11/13/16 0522  AST 26 25 25 18   ALT 19 19 17 15   ALKPHOS 95 84 83 68  BILITOT 0.8 1.3* 0.9 0.6  PROT 6.3* 6.2* 6.3* 5.0*  ALBUMIN 3.3* 3.4* 3.3* 2.5*    Coagulation Profile:  Recent Labs Lab 11/10/16 2152 11/10/16 2348  INR 1.01 1.01    CBG:  Recent Labs Lab 11/13/16 0658 11/14/16 0655 11/15/16 0634 11/16/16 0630 11/17/16 0554  GLUCAP 114* 127* 146* 110* 110*     Radiology Studies: No results found.   Medications:  Scheduled: .  stroke: mapping our early stages of recovery book   Does not apply Once  . aspirin  325 mg Oral Daily  . atenolol  100 mg Oral q morning - 10a  . clonazePAM  0.5 mg Oral BID  . diltiazem  180 mg Oral Daily  . feeding supplement (ENSURE ENLIVE)  237 mL Oral BID BM  . gabapentin  400 mg Oral TID  . levothyroxine  75 mcg Oral QAC breakfast  . lidocaine (PF)  5 mL Intradermal Once  . pantoprazole  40 mg Oral Daily  .  QUEtiapine  25 mg Oral QHS  . saccharomyces boulardii  250 mg Oral BID  . sodium phosphate  1 enema Rectal Once  . [START ON 11/18/2016] thiamine  100 mg Oral Daily  . venlafaxine XR  75 mg Oral Q breakfast   Continuous: . sodium chloride 10 mL/hr at 11/17/16 1500   [id number] **OR** acetaminophen, hydrALAZINE, lip balm, LORazepam, ondansetron (  ZOFRAN) IV, senna-docusate, sodium chloride flush  Assessment/Plan:  Principal Problem:   Alcohol withdrawal delirium (HCC) Active Problems:   GERD (gastroesophageal reflux disease)   Anxiety   Hypokalemia   Acute encephalopathy   Chronic diastolic CHF (congestive heart failure) (HCC)   Hypothyroidism   Hypertensive urgency   Altered mental state   Seizure ([place])   Fever   Cerebrovascular disease   Paroxysmal atrial fibrillation (HCC)   Sedative, hypnotic, or anxiolytic withdrawal delirium ([place])     Acute encephalopathy, Acute delirium/possible withdrawal Apparently, according to patient's family, she abuses xanax and consumes alcohol at home. -Etiology for delirium appears to be multifactorial including drug withdrawal, alcohol withdrawal, seizures. She also developed fever. Initial x-rays suggested pneumonitis. However, subsequent x-ray did not show any acute abnormalities. LP was done 12/1. CT head was unremarkable. MRI brain does not show any stroke or any other concerning findings. EEG is as above. Neurology is following. Patient has made bizarre statements. See neurology note from 12/3. Psychiatry was consulted. Appreciate their assistance. Patient started on Klonopin, Seroquel. Restarted on her Effexor. AMS related to withdrawal per neurology.  Per psych patient needs inpatient Psych facility   Paroxysmal Atrial Fibrillation Patient went into rapid atrial fibrillation on 12/2. Patient does not have any history of same. Most likely due to acute illness. TSH was normal. Echocardiogram has been ordered. Patient was already on  a beta blocker. Patient was given oral Cardizem. She then converted back to sinus rhythm within a few hours. Echocardiogram does show evidence for diastolic dysfunction. Chads2vasc score appears to be 5. However, patient does have a significant history of alcohol abuse. The risks of putting her on oral anticoagulation at this time is high. Her HAS-BLED is 3 with an estimated risk of major bleeding with one year of oral anticoagulation to be 4.9-19.6%. Agree with aspirin. Outpatient follow-up with primary care to determine if she would be a candidate for oral anticoagulation in the near future. But she will need to stop abusing alcohol.  Fever/possible viral meningitis See above. Could also be due to the withdrawal process. Blood cultures negative so far. Lumbar puncture was done. CSF does show few WBCs. Glucose is normal. Protein is mildly elevated. Not conclusive for bacterial meningitis. Could be suggestive of viral meningitis. Gram stain did not show any organisms. Patient was placed on ceftriaxone, vancomycin and ampicillin. Acyclovir was added. CSF cultures are all negative so far. Antibiotics were stopped yesterday. Acyclovir is being continued. HSV appears to be negative. VDRL is negative as well.   Seizures possibly due to drug withdrawal. No known history of same. Seizures are most likely due to withdrawal from benzodiazepine and alcohol.  EEG as above. MRI as mentioned below. Neurology is following.  Neurology recommend stopping keppra.   Nausea and vomiting Appears to have resolved. Abdominal film did not show any obstruction. Evidence for mild fecal impaction. Given an enema. Abdomen remains benign. Continue to monitor for now.  Accelerated hypertension Blood pressure remains high, but improved compared to the time of admission. Continue hydralazine as needed. Dose of atenolol was increased. Continue Cardizem. May need to increase the dose of Cardizem as well. Some of the elevation of blood  pressures is due to withdrawal. Continue to monitor for now.   Dehydration with mildly elevated creatinine/mild acute renal failure Creatinine is improved.   Chronic systolic congestive heart failure EchoCardiogram as above. Grade 2 diastolic dysfunction noted. She is well compensated. Blood pressure control.  Hyperlipidemia  Continuing home medications  when able to take orally  GERD: Protonix  Depression and anxiety: Appreciate psychiatry assistance. Patient restarted on psychotropic medications. Depending on how she does over the next few days, she may be referred to the geriatric inpatient psychiatric center. Patient started on Klonopin, Seroquel. Restarted on her Effexor.  Hypokalemia and hypomagnesemia Calcium is improved   Hypothyroidism TSH is normal. Continue Synthroid Leukocytosis; fluctuates.  Diarrhea; has history of IBS. Monitor.    DVT Prophylaxis: Lovenox   Code Status: Full code  Family Communication: No family at bedside.  Disposition Plan: Management as outlined above. Await further improvement in mental status. Will likely need to go to skilled nursing facility for rehabilitation. Wait for withdrawal symptoms to improve. May need inpatient psychiatric care.    LOS: 6 days   Niel Hummer A  [facility] Pager [phone] 11/17/2016, 3:50 PM  If 7PM-7AM, please contact night-coverage at [email or web address], password [facility]

## 2016-11-17 NOTE — Clinical Social Work Note (Signed)
CSW received consult for inpatient psychiatric placement and is following. SNF placement is recommended and an option if psychiatric placement not needed at discharge. CSW talked with patient and family (husband, daughter) at bedside regarding both options and provided bed offers for SNF placement. Patient's facility preference is [facility] as she and husband live in that area and patient has been to [facility] before. Full assessment to follow.   Carrera Kiesel Givens, MSW, LCSW Licensed Clinical Social Worker [place] [phone]

## 2016-11-17 NOTE — Progress Notes (Signed)
Physical Therapy Treatment Patient Details Name: [name] [name] MRN: [id number] DOB: 21-Jan-1936 Today's Date: 11/17/2016    History of Present Illness 80 year old Caucasian female with a past medical history of hypertension, hyperlipidemia, GERD, hypothyroidism, history of stroke, presented from home with complaints of altered mental status and seizure activity. History was provided by the patient's husband. She does not have any known history of seizure disorder. Patient was found to have elevated blood pressure. She was seen by neurology. Hospitalized for further management. Patient became more encephalopathic. She had more episodes of seizures and became febrile. Patient underwent lumbar puncture. CSF was inconclusive for infection.    PT Comments    Balance and ability to follow commands improved. Tends to shuffle with gait with decr velocity (despite stating she needed "to hurry" to get to toilet). Noted ? inpt psych admission per MD notes. Patient not yet independent with RW (if that is a requirement).   Follow Up Recommendations  SNF (?inpt psych per MD )     Equipment Recommendations  None recommended by PT    Recommendations for Other Services       Precautions / Restrictions Precautions Precautions: Fall;Other (comment) (seizure) Precaution Comments: rail pads intact, mat on right side floor next to bed, alarm    Mobility  Bed Mobility Overal bed mobility: Needs Assistance Bed Mobility: Sit to Supine       Sit to supine: Supervision   General bed mobility comments: supervision for safety  Transfers Overall transfer level: Needs assistance Equipment used: Rolling walker (2 wheeled) Transfers: Sit to/from Stand Sit to Stand: Min guard         General transfer comment: proper sequencing; for safety  Ambulation/Gait Ambulation/Gait assistance: Min guard Ambulation Distance (Feet): 10 Feet (toileted; 10) Assistive device: Rolling walker (2 wheeled) Gait  Pattern/deviations: Step-through pattern;Decreased stride length;Shuffle;Trunk flexed   Gait velocity interpretation: Below normal speed for age/gender General Gait Details: needed to use bathroom (having diarrhea) and then did not want to walk more due to fear of incontinence   Stairs            Wheelchair Mobility    Modified Rankin (Stroke Patients Only)       Balance     Sitting balance-Leahy Scale: Good     Standing balance support: Single extremity supported Standing balance-Leahy Scale: Poor                      Cognition Arousal/Alertness: Awake/alert Behavior During Therapy: WFL for tasks assessed/performed Overall Cognitive Status: Impaired/Different from baseline Area of Impairment: Awareness;Following commands;Problem solving       Following Commands: Follows one step commands consistently   Awareness: Anticipatory (re: diarrhea and not wanting to walk much) Problem Solving: Slow processing;Difficulty sequencing General Comments: Difficulty problem-solving how to avoid IV tubing wrap around her    Exercises Low Level/ICU Exercises Ankle Circles/Pumps: AROM;Both;15 reps;Supine Stabilized Bridging: AROM;Both;5 reps;Supine    General Comments        Pertinent Vitals/Pain Pain Assessment: Faces Faces Pain Scale: Hurts whole lot Pain Location: bottom Pain Descriptors / Indicators: Burning (raw) Pain Intervention(s): Repositioned;Other (comment) (RN notified re: ?cream)    Home Living                      Prior Function            PT Goals (current goals can now be found in the care plan section) Acute Rehab PT Goals Patient Stated  Goal: to get better Time For Goal Achievement: 11/28/16 Progress towards PT goals: Progressing toward goals    Frequency    Min 2X/week      PT Plan Current plan remains appropriate    Co-evaluation             End of Session Equipment Utilized During Treatment: Gait  belt Activity Tolerance: Patient tolerated treatment well Patient left: in bed;with call bell/phone within reach     Time: 1046-1110 PT Time Calculation (min) (ACUTE ONLY): 24 min  Charges:  $Gait Training: 8-22 mins $Therapeutic Exercise: 8-22 mins                    G CodesJeanie Cooks Mashell Sieben Nov 20, 2016, 12:27 PM Pager [phone]

## 2016-11-18 LAB — CULTURE, BLOOD (SINGLE): Culture: NO GROWTH

## 2016-11-18 LAB — GLUCOSE, CAPILLARY: Glucose-Capillary: 114 mg/dL — ABNORMAL HIGH (ref 65–99)

## 2016-11-18 MED ORDER — ASPIRIN 325 MG PO TABS: 325.0000 mg | ORAL_TABLET | Freq: Every day | ORAL | 0 refills | Status: DC

## 2016-11-18 MED ORDER — FOLIC ACID 1 MG PO TABS: 1.0000 mg | ORAL_TABLET | Freq: Every day | ORAL | 0 refills | Status: DC

## 2016-11-18 MED ORDER — THIAMINE HCL 100 MG PO TABS: 100.0000 mg | ORAL_TABLET | Freq: Every day | ORAL | 0 refills | Status: DC

## 2016-11-18 MED ORDER — ENSURE ENLIVE PO LIQD: 237.0000 mL | Freq: Two times a day (BID) | ORAL | 12 refills | Status: DC

## 2016-11-18 MED ORDER — LORAZEPAM 1 MG PO TABS
2.0000 mg | ORAL_TABLET | Freq: Four times a day (QID) | ORAL | Status: DC | PRN
  Administered 2016-11-18: 2 mg via ORAL
  Filled 2016-11-18 (×2): qty 2

## 2016-11-18 MED ORDER — QUETIAPINE FUMARATE 25 MG PO TABS: 25.0000 mg | ORAL_TABLET | Freq: Every day | ORAL | 0 refills | Status: DC

## 2016-11-18 MED ORDER — DILTIAZEM HCL ER COATED BEADS 180 MG PO CP24: 180.0000 mg | ORAL_CAPSULE | Freq: Every day | ORAL | 0 refills | Status: DC

## 2016-11-18 MED ORDER — FOLIC ACID 1 MG PO TABS
1.0000 mg | ORAL_TABLET | Freq: Every day | ORAL | Status: DC
  Administered 2016-11-18: 1 mg via ORAL
  Filled 2016-11-18: qty 1

## 2016-11-18 MED ORDER — ATENOLOL 100 MG PO TABS: 100.0000 mg | ORAL_TABLET | Freq: Every morning | ORAL | 0 refills | Status: DC

## 2016-11-18 MED ORDER — THIAMINE HCL 100 MG/ML IJ SOLN: 100.0000 mg | Freq: Every day | INTRAMUSCULAR | Status: DC

## 2016-11-18 MED ORDER — SACCHAROMYCES BOULARDII 250 MG PO CAPS: 250.0000 mg | ORAL_CAPSULE | Freq: Two times a day (BID) | ORAL | 0 refills | Status: DC

## 2016-11-18 MED ORDER — CLONAZEPAM 0.5 MG PO TABS: 0.5000 mg | ORAL_TABLET | Freq: Two times a day (BID) | ORAL | 0 refills | Status: DC

## 2016-11-18 MED ORDER — VITAMIN B-1 100 MG PO TABS
100.0000 mg | ORAL_TABLET | Freq: Every day | ORAL | Status: DC
  Administered 2016-11-18: 100 mg via ORAL
  Filled 2016-11-18: qty 1

## 2016-11-18 MED ORDER — LORAZEPAM 2 MG/ML IJ SOLN: 2.0000 mg | Freq: Four times a day (QID) | INTRAMUSCULAR | Status: DC | PRN

## 2016-11-18 MED ORDER — AMLODIPINE BESYLATE 2.5 MG PO TABS: 2.5000 mg | ORAL_TABLET | Freq: Every day | ORAL | 0 refills | Status: DC

## 2016-11-18 NOTE — Consult Note (Signed)
[place] [facility] Consult   Reason for Consult:  AMS, history of substance abuse (alcohol and xanax) and depression Referring Physician:  Dr.Krishnan Patient Identification: [name] [name] MRN:  [id number] Principal Diagnosis: Alcohol withdrawal delirium [facility]) Diagnosis:   Patient Active Problem List   Diagnosis Date Noted  . 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 Paroxysmal atrial fibrillation (HCC) [I48.0]   . Cerebrovascular disease [I67.9]   . Alcohol withdrawal delirium ([place]) [F10.231]   . Fever [R50.9]   . Acute encephalopathy [G93.40] 11/10/2016  . Hypothyroidism [E03.9] 11/10/2016  . Hypertensive urgency [I16.0] 11/10/2016  . Altered mental state [R41.82] 11/10/2016  . Seizure ([place]) [R56.9] 11/10/2016  . Chronic diastolic CHF (congestive heart failure) ([place]) [I50.32]   . Left carotid stenosis [I65.22] 10/29/2016  . S/P total knee replacement [Z96.659] 03/10/2016  . 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 Ileus ([place]) [K56.7] 09/05/2014  . Leukocytosis, unspecified [D72.829] 09/05/2014  . Vomiting [R11.10] 09/04/2014  . Pulmonary edema [J81.1] 09/04/2014  . Hypokalemia [E87.6] 08/28/2014  . Enteritis due to Clostridium difficile [A04.72] 08/27/2014  . Common bile duct (CBD) stricture [K83.1] 08/27/2014  . Protein-calorie malnutrition, severe ([place]) [E43] 08/24/2014  . Obstructive jaundice [K83.8] 08/23/2014  . Acute cholangitis [K83.0] 08/23/2014  . ARF (acute renal failure) ([place]) [N17.9] 08/23/2014  . Coagulopathy ([place]) [D68.9] 08/23/2014  . Back pain [M54.9]   . Arthritis [M19.90]   . Hypertension [I10]   . GERD (gastroesophageal reflux disease) [K21.9]   . Anxiety [F41.9]   . IBS (irritable bowel syndrome) [K58.9]   . Hernia of Right posterior flank [K45.8] 04/18/2012    Total Time spent with patient: 45 minutes  Subjective:   [name] [name] is a 80 y.o. female patient admitted with Withdrawal  seizures.   HPI: [name] [name] is a 80 years old female admitted to [facility] with clonic tonic seizures without history of seizure disorder. Patient was initially received neurology consult in the emergency department and loaded with Keppra. Further neurological evaluation indicated patient does not have seizure activity or stroke. Patient family reported that patient has been suffering with chronic depression, anxiety and receiving outpatient psychiatric medications including Xanax, Effexor and Neurontin and also reportedly taking Xanax excessively up to 7-8 pills a day and then ran out of the medications he started drinking wine up to 1-2 bottles a day. Patient is a poor historian during my evaluation reported she has visions of being doomed, rumination about destruction especially 911 event and reportedly seeing people who were not there and reportedly they are going to cause significant damage. Patient feels she is weak and tired could not fight with them. Patient also felt that "I want to die because of tired being here". Patient husband at bedside reported she has been seeing Dr. Toy Care, is a private psychiatrist providing outpatient medication management for long time. Patient denies previous history of alcohol/benzodiazepine withdrawal seizures and encephalopathy. She has no urine drug screen or blood alcohol level the time of presentation due to being presented with the violent seizure, encephalopathy in the emergency department.  Medical history: 80 year old Caucasian female with a past medical history of hypertension, hyperlipidemia, GERD, hypothyroidism, history of stroke, presented from home with complaints of altered mental status and seizure activity. History was provided by the patient's husband. She does not have any known history of seizure disorder. Patient was found to have elevated blood pressure. She was seen by neurology. Hospitalized for further management. Patient became more  encephalopathic.  She had more episodes of seizures and became febrile. Patient underwent lumbar puncture. CSF was inconclusive for infection. Patient's mental status started improving.   Past Psychiatric History: Patient has been diagnosed with a chronic anxiety and depression and denied acute psychiatric admission.  Interval History: Patient seen for this face-to-face psychiatric consultation follow-up. Patient appeared awake, alert, oriented to time place person and situation. Patient has been free from altered mental status and her mental status is back to normal and she also has no known symptoms of acute alcohol or benzodiazepine withdrawal symptoms. Patient has no safety concerns and has mild generalized weakness and also complaining about disturbed sleep last night and able to sleep only after giving sleep medications morning. Patient is agreeing to go to [facility] - skilled nursing facility for rehabilitation services.  Risk to Self: Is patient at risk for suicide?: No Risk to Others:   Prior Inpatient Therapy:   Prior Outpatient Therapy:    Past Medical History:  Past Medical History:  Diagnosis Date  . Anemia    hx of years ago   . Anxiety   . ARF (acute renal failure) (HCC) 08/23/2014  . Arthritis    psoriatic arthritis  . Back pain   . Blood transfusion    at age of 4  . Chronic diastolic CHF (congestive heart failure) (HCC)   . Depression   . GERD (gastroesophageal reflux disease)   . H/O hiatal hernia   . Hyperlipidemia   . Hypertension   . Hypothyroidism   . IBS (irritable bowel syndrome)   . Pancreatitis   . PONV (postoperative nausea and vomiting)   . Thyroid disease     Past Surgical History:  Procedure Laterality Date  . ABDOMINAL HYSTERECTOMY     partial  . BACK SURGERY  2010   thoractic-screws and rods  . BILE DUCT STENT PLACEMENT  08/26/2014  . BREAST REDUCTION SURGERY    . CHOLECYSTECTOMY  1988   lap  . COLONOSCOPY    . COLONOSCOPY Left 09/08/2014    Procedure: COLONOSCOPY;  Surgeon: [name] Modena, MD;  Location: [facility] ENDOSCOPY;  Service: Endoscopy;  Laterality: Left;  . ERCP    . ERCP N/A 08/26/2014   Procedure: ENDOSCOPIC RETROGRADE CHOLANGIOPANCREATOGRAPHY (ERCP);  Surgeon: [name] Kuba, MD;  Location: [facility] ENDOSCOPY;  Service: Endoscopy;  Laterality: N/A;  . ERCP N/A 09/11/2014   Procedure: ENDOSCOPIC RETROGRADE CHOLANGIOPANCREATOGRAPHY (ERCP);  Surgeon: [name] Modena, MD;  Location: [facility] ENDOSCOPY;  Service: Endoscopy;  Laterality: N/A;  . EUS N/A 09/11/2014   Procedure: ESOPHAGEAL ENDOSCOPIC ULTRASOUND (EUS) RADIAL;  Surgeon: [name] Modena, MD;  Location: [facility] ENDOSCOPY;  Service: Endoscopy;  Laterality: N/A;  . JOINT REPLACEMENT  2005   left shoulder replacement   . ORIF CLAVICULAR FRACTURE Left 07/18/2014   Procedure: OPEN REDUCTION INTERNAL FIXATION (ORIF) LEFT CLAVICULAR FRACTURE;  Surgeon: Loreta Ave, MD;  Location: [facility];  Service: Orthopedics;  Laterality: Left;  . SPYGLASS CHOLANGIOSCOPY N/A 09/11/2014   Procedure: SPYGLASS CHOLANGIOSCOPY;  Surgeon: [name] Modena, MD;  Location: [facility] ENDOSCOPY;  Service: Endoscopy;  Laterality: N/A;  . TONSILLECTOMY  as child  . TOTAL KNEE ARTHROPLASTY Left 03/10/2016   Procedure: LEFT TOTAL KNEE ARTHROPLASTY;  Surgeon: Loreta Ave, MD;  Location: [facility] OR;  Service: Orthopedics;  Laterality: Left;  [name] VENTRAL HERNIA REPAIR  06/08/2012   Procedure: LAPAROSCOPIC VENTRAL HERNIA;  Surgeon: Ardeth Sportsman, MD;  Location: [facility] ORS;  Service: General;  Laterality: Right;   Family History:  Family  History  Problem Relation Age of Onset  . Heart disease Mother   . Cancer Father     lung   Family Psychiatric  History: Patient has a brother with unknown mental illness and has a daughter who is a grownup with the depression and anxiety and also takes medications Xanax.  Social History:  History  Alcohol Use  . 0.6 oz/week  . 1 Glasses of wine per week    Comment: rare     History   Drug Use  . Types: Benzodiazepines    Social History   Social History  . Marital status: Married    Spouse name: N/A  . Number of children: N/A  . Years of education: N/A   Social History Main Topics  . Smoking status: Never Smoker  . Smokeless tobacco: Never Used  . Alcohol use 0.6 oz/week    1 Glasses of wine per week     Comment: rare  . Drug use:     Types: Benzodiazepines  . Sexual activity: Not Currently   Other Topics Concern  . None   Social History Narrative  . None   Additional Social History:    Allergies:   Allergies  Allergen Reactions  . Tetanus Toxoids Swelling  . Yellow Dyes (Non-Tartrazine) Itching and Other (See Comments)    Makes patient nervous     Labs:  Results for orders placed or performed during the hospital encounter of 11/10/16 (from the past 48 hour(s))  Glucose, capillary     Status: Abnormal   Collection Time: 11/17/16  5:54 AM  Result Value Ref Range   Glucose-Capillary 110 (H) 65 - 99 mg/dL   Comment 1 Notify RN    Comment 2 Document in Chart   CBC     Status: Abnormal   Collection Time: 11/17/16  7:31 AM  Result Value Ref Range   WBC 13.2 (H) 4.0 - 10.5 K/uL   RBC 3.44 (L) 3.87 - 5.11 MIL/uL   Hemoglobin 10.3 (L) 12.0 - 15.0 g/dL   HCT 32.1 (L) 36.0 - 46.0 %   MCV 93.3 78.0 - 100.0 fL   MCH 29.9 26.0 - 34.0 pg   MCHC 32.1 30.0 - 36.0 g/dL   RDW 14.5 11.5 - 15.5 %   Platelets 179 150 - 400 K/uL  Basic metabolic panel     Status: Abnormal   Collection Time: 11/17/16  7:31 AM  Result Value Ref Range   Sodium 141 135 - 145 mmol/L   Potassium 3.7 3.5 - 5.1 mmol/L   Chloride 112 (H) 101 - 111 mmol/L   CO2 24 22 - 32 mmol/L   Glucose, Bld 102 (H) 65 - 99 mg/dL   BUN 14 6 - 20 mg/dL   Creatinine, Ser 0.82 0.44 - 1.00 mg/dL   Calcium 8.4 (L) 8.9 - 10.3 mg/dL   GFR calc non Af Amer >60 >60 mL/min   GFR calc Af Amer >60 >60 mL/min    Comment: (NOTE) The eGFR has been calculated using the CKD EPI equation. This  calculation has not been validated in all clinical situations. eGFR's persistently <60 mL/min signify possible Chronic Kidney Disease.    Anion gap 5 5 - 15  Glucose, capillary     Status: Abnormal   Collection Time: 11/18/16  6:15 AM  Result Value Ref Range   Glucose-Capillary 114 (H) 65 - 99 mg/dL   Comment 1 Notify RN    Comment 2 Document in Chart  Current Facility-Administered Medications  Medication Dose Route Frequency Provider Last Rate Last Dose  .  stroke: mapping our early stages of recovery book   Does not apply Once [name] Costa, MD      . 0.9 %  sodium chloride infusion   Intravenous Continuous [name] Haff, MD   Stopped at 11/17/16 1930  . acetaminophen (TYLENOL) tablet 650 mg  650 mg Oral Q4H PRN [name] Costa, MD   650 mg at 11/16/16 1135   Or  . acetaminophen (TYLENOL) suppository 650 mg  650 mg Rectal Q4H PRN [name] Costa, MD   650 mg at 11/12/16 7829  . amLODipine (NORVASC) tablet 2.5 mg  2.5 mg Oral Daily [name] A Regalado, MD   2.5 mg at 11/18/16 1023  . aspirin tablet 325 mg  325 mg Oral Daily [name] Costa, MD   325 mg at 11/18/16 1022  . atenolol (TENORMIN) tablet 100 mg  100 mg Oral q morning - 10a [name] Haff, MD   100 mg at 11/18/16 1021  . clonazePAM (KLONOPIN) tablet 0.5 mg  0.5 mg Oral BID [name] Finland, MD   0.5 mg at 11/18/16 1023  . diltiazem (CARDIZEM CD) 24 hr capsule 180 mg  180 mg Oral Daily [name] Haff, MD   180 mg at 11/18/16 1020  . feeding supplement (ENSURE ENLIVE) (ENSURE ENLIVE) liquid 237 mL  237 mL Oral BID BM [name] Haff, MD   237 mL at 11/18/16 1019  . folic acid (FOLVITE) tablet 1 mg  1 mg Oral Daily [name] Reach, MD   1 mg at 11/18/16 1024  . gabapentin (NEURONTIN) capsule 400 mg  400 mg Oral TID [name] Haff, MD   400 mg at 11/18/16 1022  . hydrALAZINE (APRESOLINE) injection 5 mg  5 mg Intravenous Q2H PRN [name] Costa, MD   5 mg at 11/15/16 0607  . levothyroxine (SYNTHROID, LEVOTHROID) tablet 75 mcg  75 mcg Oral QAC  breakfast [name] Haff, MD   75 mcg at 11/18/16 0751  . lidocaine (PF) (XYLOCAINE) 1 % injection 5 mL  5 mL Intradermal Once [name] Haff, MD      . lip balm (BLISTEX) ointment   Topical PRN [name] Haff, MD      . LORazepam (ATIVAN) injection 1 mg  1 mg Intravenous Q2H PRN [name] Costa, MD   1 mg at 11/16/16 1853  . LORazepam (ATIVAN) tablet 2 mg  2 mg Oral Q6H PRN [name] Reach, MD   2 mg at 11/18/16 0335   Or  . LORazepam (ATIVAN) injection 2 mg  2 mg Intravenous Q6H PRN [name] Reach, MD      . ondansetron [facility]) injection 4 mg  4 mg Intravenous Q6H PRN [name] Haff, MD   4 mg at 11/16/16 1234  . pantoprazole (PROTONIX) EC tablet 40 mg  40 mg Oral Daily [name] Haff, MD   40 mg at 11/18/16 1024  . QUEtiapine (SEROQUEL) tablet 25 mg  25 mg Oral QHS [name] Finland, MD   25 mg at 11/17/16 2302  . saccharomyces boulardii (FLORASTOR) capsule 250 mg  250 mg Oral BID [name] Haff, MD   250 mg at 11/18/16 1021  . senna-docusate (Senokot-S) tablet 1 tablet  1 tablet Oral QHS PRN [name] Costa, MD      . sodium chloride flush (NS) 0.9 % injection 10-40 mL  10-40 mL Intracatheter PRN [name] Haff, MD   10 mL at 11/14/16 0447  . thiamine (VITAMIN B-1) tablet 100 mg  100 mg Oral Daily [name] Reach, MD   100 mg at 11/18/16 1022  . venlafaxine XR (EFFEXOR-XR) 24 hr capsule 75 mg  75 mg Oral Q breakfast [name] Finland, MD   75 mg at 11/18/16 0751    Musculoskeletal: Strength & Muscle Tone: decreased Gait & Station: unable to stand Patient leans: N/A  Psychiatric Specialty Exam: Physical Exam  as per history and physical   ROS   Blood pressure (!) 127/48, pulse 68, temperature 98.4 F (36.9 C), temperature source Oral, resp. rate 18, height _0  (1.549 m), weight 67.7 kg (149 lb 4 oz), SpO2 96 %.Body mass index is 28.2 kg/m.  General Appearance: Casual  Eye Contact:  Fair  Speech:  Clear and Coherent  Volume:  Decreased  Mood:  Euthymic  Affect:   Appropriate and Congruent  Thought Process:  Coherent and Goal Directed  Orientation:  Full (Time, Place, and Person)  Thought Content:  WDL  Suicidal Thoughts:  No  Homicidal Thoughts:  No  Memory:  Immediate;   Fair Recent;   Poor Remote;   Fair  Judgement:  Intact  Insight:  Fair  Psychomotor Activity:  Normal  Concentration:  Concentration: Good and Attention Span: Good  Recall:  Good  Fund of Knowledge:  Fair  Language:  Good  Akathisia:  Negative  Handed:  Right  AIMS (if indicated):     Assets:  Communication Skills Desire for Improvement Financial Resources/Insurance Housing Intimacy Leisure Time Resilience Social Support Transportation  ADL's:  Intact  Cognition:  WNL  Sleep:        Treatment Plan Summary: 80 years old female with a history of chronic mental illness manic depression, anxiety and also possibly abuse /dependence on Xanax and alcohol presented with possibly withdrawal seizures required loading up with Keppra on admission. Patient does not have a history of seizure disorder  and further neurological examination to be patient has no evidence of stroke or seizures.   Case discussed with case manager Altered mental status: Patient improved mental status back to her baseline  Patient has no safety concerns WAnd has no further withdrawal seizures.  Patient has completed her detox treatment for benzodiazepines and alcohol Continue Seroquel 25 mg twice daily for mood Continue clonazepam 0.5 mg mg twice daily as needed for possible withdrawal from benzo Continue Effexor XR 75 mg mg daily for depression and anxiety Continue  gabapentin 400 mg 3 times daily for anxiety Patient counseled stay free from alcohol and benzodiazepines abuse versus dependence Patient does not meet criteria for acute psychiatric hospitalization   Appreciate psychiatric consultation sign off as of today Please contact [phone] or [phone] if needs further  assistance  Disposition: Patient will be referred to the skilled nursing facility with rehabilitation No evidence of imminent risk to self or others at present.   Supportive therapy provided about ongoing stressors.  [name] Finland, MD 11/18/2016 2:05 PM

## 2016-11-18 NOTE — Progress Notes (Signed)
Patient was transported to [facility] via Ambulance. Report was called. Family was notified.  Arta Silence, RN  11/18/2016 4:16 PM

## 2016-11-18 NOTE — Progress Notes (Signed)
Occupational Therapy Treatment Patient Details Name: [name] [name] MRN: [id number] DOB: 03/11/1936 Today's Date: 11/18/2016    History of present illness 80 year old Caucasian female with a past medical history of hypertension, hyperlipidemia, GERD, hypothyroidism, history of stroke, presented from home with complaints of altered mental status and seizure activity. History was provided by the patient's husband. She does not have any known history of seizure disorder. Patient was found to have elevated blood pressure. She was seen by neurology. Hospitalized for further management. Patient became more encephalopathic. She had more episodes of seizures and became febrile. Patient underwent lumbar puncture. CSF was inconclusive for infection.   OT comments  Pt progressing towards acute OT goals. Focus of session was toilet transfer and pericare, and grooming task standing at sink. Pt min A for LB ADLs and pericare. D/c plan remains appropriate.   Follow Up Recommendations  SNF;Supervision/Assistance - 24 hour    Equipment Recommendations  3 in 1 bedside commode    Recommendations for Other Services      Precautions / Restrictions Precautions Precautions: Fall;Other (comment) Precaution Comments: rail pads intact, mat on right side floor next to bed, alarm Restrictions Weight Bearing Restrictions: No       Mobility Bed Mobility Overal bed mobility: Needs Assistance Bed Mobility: Sit to Supine;Rolling;Sit to Sidelying Rolling: Modified independent (Device/Increase time)   Supine to sit: Min guard   Sit to sidelying: Min guard General bed mobility comments: instructed on log rolling on return to bed for comfort due to back and bottom pain.   Transfers Overall transfer level: Needs assistance Equipment used: Rolling walker (2 wheeled) Transfers: Sit to/from Stand Sit to Stand: Min guard         General transfer comment: from EOB and 3n1 over toilet    Balance Overall balance  assessment: History of Falls         Standing balance support: Single extremity supported;During functional activity Standing balance-Leahy Scale: Poor                     ADL Overall ADL's : Needs assistance/impaired     Grooming: Min guard;Standing;Oral care               Lower Body Dressing: Minimal assistance;Sit to/from stand Lower Body Dressing Details (indicate cue type and reason): assist for balance Toilet Transfer: Min guard;Ambulation;RW   Toileting- Clothing Manipulation and Hygiene: Minimal assistance;Sit to/from stand Toileting - Clothing Manipulation Details (indicate cue type and reason): assist for pericare     Functional mobility during ADLs: Min guard;Rolling walker General ADL Comments: Pt completed toilet transfer, pericare, grooming task at sink as detailed above. Pt alert and appropriately conversational.       Vision                     Perception     Praxis      Cognition   Behavior During Therapy: [facility] for tasks assessed/performed Overall Cognitive Status: Impaired/Different from baseline Area of Impairment: Problem solving     Memory: Decreased short-term memory  Following Commands: Follows one step commands consistently;Follows multi-step commands with increased time   Awareness: Anticipatory Problem Solving: Slow processing      Extremity/Trunk Assessment               Exercises     Shoulder Instructions       General Comments      Pertinent Vitals/ Pain       Pain Assessment:  Faces Faces Pain Scale: Hurts whole lot Pain Location: bottom; back pain Pain Descriptors / Indicators: Burning;Aching Pain Intervention(s): Limited activity within patient's tolerance;Monitored during session;Repositioned;Other (comment) (applied pericare cream after toileting)  Home Living                                          Prior Functioning/Environment              Frequency  Min 2X/week         Progress Toward Goals  OT Goals(current goals can now be found in the care plan section)  Progress towards OT goals: Progressing toward goals  Acute Rehab OT Goals Patient Stated Goal: to get better OT Goal Formulation: With patient Time For Goal Achievement: 11/28/16 Potential to Achieve Goals: Good ADL Goals Pt Will Perform Upper Body Bathing: with set-up;with supervision;sitting Pt Will Perform Lower Body Bathing: with set-up;sitting/lateral leans;with supervision Pt Will Perform Upper Body Dressing: with set-up;with supervision;sitting Pt Will Perform Lower Body Dressing: with set-up;with supervision;sit to/from stand Pt Will Transfer to Toilet: with supervision;ambulating;bedside commode Pt Will Perform Toileting - Clothing Manipulation and hygiene: with supervision;sitting/lateral leans Additional ADL Goal #1: Pt will demonstrate emegent awareness in moderately distracting environment  Plan Discharge plan remains appropriate    Co-evaluation                 End of Session Equipment Utilized During Treatment: Rolling walker   Activity Tolerance Patient tolerated treatment well   Patient Left in bed;with call bell/phone within reach;with bed alarm set   Nurse Communication Other (comment) (improved cognition)        Time: 1125-1150 OT Time Calculation (min): 25 min  Charges: OT General Charges $OT Visit: 1 Procedure OT Treatments $Self Care/Home Management : 23-37 mins  Hortencia Pilar 11/18/2016, 2:07 PM

## 2016-11-18 NOTE — Care Management Note (Signed)
Case Management Note  Patient Details  Name: KOA CRICKMORE MRN: [id number] Date of Birth: [birth year]  Subjective/Objective:                    Action/Plan: Pt discharging to [facility]. No further needs per CM.   Expected Discharge Date:  11/13/16               Expected Discharge Plan:  [place]  In-House Referral:  NA  Discharge planning Services  CM Consult  Post Acute Care Choice:    Choice offered to:     DME Arranged:    DME Agency:     HH Arranged:    HH Agency:     Status of Service:  Completed, signed off  If discussed at [employer], dates discussed:    Additional Comments:  Pollie Friar, RN 11/18/2016, 3:09 PM

## 2016-11-18 NOTE — Clinical Social Work Placement (Signed)
   CLINICAL SOCIAL WORK PLACEMENT  NOTE 11/18/16 - DISCHARGED TO [facility]  Date:  11/18/2016  Patient Details  Name: [name] [name] MRN: [id number] Date of Birth: [birth year]  Clinical Social Work is seeking post-discharge placement for this patient at the [place] level of care (*CSW will initial, date and re-position this form in  chart as items are completed):  No (family had facility preference)   Patient/family provided with [facility] Work Department's list of facilities offering this level of care within the geographic area requested by the patient (or if unable, by the patient's family).  Yes   Patient/family informed of their freedom to choose among providers that offer the needed level of care, that participate in Medicare, Medicaid or managed care program needed by the patient, have an available bed and are willing to accept the patient.  No   Patient/family informed of [facility]'s ownership interest in [facility] and [facility], as well as of the fact that they are under no obligation to receive care at these facilities.  PASRR submitted to EDS on 11/17/16     PASRR number received on 11/18/16     Existing PASRR number confirmed on       FL2 transmitted to all facilities in geographic area requested by pt/family on 11/17/16     FL2 transmitted to all facilities within larger geographic area on       Patient informed that his/her managed care company has contracts with or will negotiate with certain facilities, including the following:        Yes   Patient/family informed of bed offers received.  Patient chooses bed at [facility], [place]     Physician recommends and patient chooses bed at      Patient to be transferred to [facility] on 11/18/16.  Patient to be transferred to facility by ambulance     Patient family notified on 11/18/16 of transfer.  Name of family member notified:  Ettie Picton (nurse contacted spouse 12/7 to confirm that patient going to [facility])     PHYSICIAN       Additional Comment:    _______________________________________________ Sable Feil, LCSW 11/18/2016, 2:38 PM

## 2016-11-18 NOTE — Discharge Summary (Signed)
Physician Discharge Summary  [name] [name] [id number] DOB: [birth year] DOA: 11/10/2016  PCP: Birdie Sons, MD (Inactive)  Admit date: 11/10/2016 Discharge date: 11/18/2016  Admitted From: Home  Disposition: SNF  Recommendations for Outpatient Follow-up:  1. Follow up with PCP in 1-2 weeks 2. Please obtain BMP/CBC in one week 3. Adjust medications as needed. Needs slow taper of benzo,.     Discharge Condition; Stable.  CODE STATUS; Full code.  Diet recommendation: Heart Healthy  Brief/Interim Summary: 80 year old Caucasian female with a past medical history of hypertension, hyperlipidemia, GERD, hypothyroidism, history of stroke, presented from home with complaints of altered mental status and seizure activity. History was provided by the patient's husband. She does not have any known history of seizure disorder. Patient was found to have elevated blood pressure. She was seen by neurology. Hospitalized for further management. Patient became more encephalopathic. She had more episodes of seizures and became febrile. Patient underwent lumbar puncture. CSF was inconclusive for infection. Patient's mental status started improving. Psychiatry was also consulted to assist with management.  Acute encephalopathy, Acute delirium/possible withdrawal Apparently, according to patient's family, she abuses xanax and consumes alcohol at home. -Etiology for delirium appears to be multifactorial including drug withdrawal, alcohol withdrawal, seizures. She also developed fever. Initial x-rays suggested pneumonitis. However, subsequent x-ray did not show any acute abnormalities. LP was done 12/1. CT head was unremarkable. MRI brain does not show any stroke or any other concerning findings. EEG is as above. Neurology is following. Patient has made bizarre statements. See neurology note from 12/3. Psychiatry was consulted. Appreciate their assistance. Patient started on Klonopin, Seroquel. Restarted  on her Effexor. Needs benzo taper.  AMS related to withdrawal per neurology.  Clear by psych, ok to discharge patient to SNF>   Paroxysmal Atrial Fibrillation Patient went into rapid atrial fibrillation on 12/2. Patient does not have any history of same. Most likely due to acute illness. TSH was normal. Echocardiogram has been ordered. Patient was already on a beta blocker. Patient was given oral Cardizem. She then converted back to sinus rhythm within a few hours. Echocardiogram does show evidence for diastolic dysfunction. Chads2vasc score appears to be 5. However, patient does have a significant history of alcohol abuse. The risks of putting her on oral anticoagulation at this time is high. Her HAS-BLED is 3 with an estimated risk of major bleeding with one year of oral anticoagulation to be 4.9-19.6%. Agree with aspirin. Outpatient follow-up with primary care to determine if she would be a candidate for oral anticoagulation in the near future. But she will need to stop abusing alcohol.  Fever/possible viral meningitis See above. Could also be due to the withdrawal process. Blood cultures negative so far. Lumbar puncture was done. CSF does show few WBCs. Glucose is normal. Protein is mildly elevated. Not conclusive for bacterial meningitis. Could be suggestive of viral meningitis. Gram stain did not show any organisms. Patient was placed on ceftriaxone, vancomycin and ampicillin. Acyclovir was added. CSF cultures are all negative so far. Antibiotics were stopped . Acyclovir is being continued. HSV appears to be negative. VDRL is negative as well.  resolved.   Seizures possibly due to drug withdrawal. No known history of same. Seizures are most likely due to withdrawal from benzodiazepine and alcohol.  EEG as above. MRI as mentioned below. Neurology is following.  Neurology recommend stopping keppra.   Nausea and vomiting Appears to have resolved. Abdominal film did not show any obstruction.  Evidence for mild fecal impaction. Given  an enema. Abdomen remains benign. Continue to monitor for now.  Accelerated hypertension Blood pressure remains high, but improved compared to the time of admission. Continue hydralazine as needed. Dose of atenolol was increased. Continue Cardizem. May need to increase the dose of Cardizem as well. Some of the elevation of blood pressures is due to withdrawal. Continue to monitor for now.   Dehydration with mildly elevated creatinine/mild acute renal failure Creatinine is improved.   Chronic systolic congestive heart failure EchoCardiogram as above. Grade 2 diastolic dysfunction noted. She is well compensated. Blood pressure control.  Hyperlipidemia  Continuing home medications   GERD: Protonix  Depression and anxiety: Appreciate psychiatry assistance. Patient restarted on psychotropic medications. Depending on how she does over the next few days, she may be referred to the geriatric inpatient psychiatric center. Patient started on Klonopin, Seroquel. Restarted on her Effexor. needs follow up with psychiatrist   Hypokalemia and hypomagnesemia Calcium is improved   Hypothyroidism TSH is normal. Continue Synthroid Leukocytosis; fluctuates.  Diarrhea; has history of IBS. Monitor. Improved.     Discharge Diagnoses:  Principal Problem:   Alcohol withdrawal delirium (HCC) Active Problems:   GERD (gastroesophageal reflux disease)   Anxiety   Hypokalemia   Acute encephalopathy   Chronic diastolic CHF (congestive heart failure) (HCC)   Hypothyroidism   Hypertensive urgency   Altered mental state   Seizure ([place])   Fever   Cerebrovascular disease   Paroxysmal atrial fibrillation (HCC)   Sedative, hypnotic, or anxiolytic withdrawal delirium [facility])    Discharge Instructions  Discharge Instructions    Diet - low sodium heart healthy    Complete by:  As directed    Increase activity slowly    Complete by:  As directed         Medication List    STOP taking these medications   ALPRAZolam 1 MG tablet Commonly known as:  XANAX   SILENOR 6 MG Tabs Generic drug:  Doxepin HCl   tiZANidine 4 MG tablet Commonly known as:  ZANAFLEX   zolpidem 5 MG tablet Commonly known as:  AMBIEN     TAKE these medications   amLODipine 2.5 MG tablet Commonly known as:  NORVASC Take 1 tablet (2.5 mg total) by mouth daily. Start taking on:  11/19/2016   aspirin 325 MG tablet Take 1 tablet (325 mg total) by mouth daily. Start taking on:  11/19/2016   atenolol 100 MG tablet Commonly known as:  TENORMIN Take 1 tablet (100 mg total) by mouth every morning. Start taking on:  11/19/2016 What changed:  medication strength  how much to take   clonazePAM 0.5 MG tablet Commonly known as:  KLONOPIN Take 1 tablet (0.5 mg total) by mouth 2 (two) times daily.   diltiazem 180 MG 24 hr capsule Commonly known as:  CARDIZEM CD Take 1 capsule (180 mg total) by mouth daily. Start taking on:  11/19/2016   esomeprazole 40 MG capsule Commonly known as:  NEXIUM Take 40 mg by mouth daily.   feeding supplement (ENSURE ENLIVE) Liqd Take 237 mLs by mouth 2 (two) times daily between meals.   folic acid 1 MG tablet Commonly known as:  FOLVITE Take 1 tablet (1 mg total) by mouth daily. Start taking on:  11/19/2016   furosemide 20 MG tablet Commonly known as:  LASIX Take 20 mg by mouth daily with breakfast.   gabapentin 400 MG capsule Commonly known as:  NEURONTIN Take 400 mg by mouth 6 (six) times daily. As needed  for nerve pain   hyoscyamine 0.375 MG 12 hr tablet Commonly known as:  LEVBID Take 0.375 mg by mouth every 12 (twelve) hours as needed for cramping.   levothyroxine 75 MCG tablet Commonly known as:  SYNTHROID, LEVOTHROID Take 75 mcg by mouth daily before breakfast.   multivitamin with minerals Tabs tablet Take 1 tablet by mouth daily as needed (for supplementation).   potassium chloride SA 20 MEQ  tablet Commonly known as:  K-DUR,KLOR-CON Take 40 mEq by mouth daily.   QUEtiapine 25 MG tablet Commonly known as:  SEROQUEL Take 1 tablet (25 mg total) by mouth at bedtime.   saccharomyces boulardii 250 MG capsule Commonly known as:  FLORASTOR Take 1 capsule (250 mg total) by mouth 2 (two) times daily.   simvastatin 10 MG tablet Commonly known as:  ZOCOR Take 10 mg by mouth at bedtime.   thiamine 100 MG tablet Take 1 tablet (100 mg total) by mouth daily. Start taking on:  11/19/2016   venlafaxine XR 75 MG 24 hr capsule Commonly known as:  EFFEXOR-XR Take 75 mg by mouth daily with breakfast.       Allergies  Allergen Reactions  . Tetanus Toxoids Swelling  . Yellow Dyes (Non-Tartrazine) Itching and Other (See Comments)    Makes patient nervous     Consultations: Psych Neurology   Procedures/Studies: Mr Virgel Paling Wo Contrast  Result Date: 10/22/2016  [facility] [address], [place], [place] [address] [phone] NEUROIMAGING REPORT STUDY DATE: 10/20/2016 PATIENT NAME: TALEIGH SEPE DOB: Aug 27, 1936 MRN: [id number] ORDERING CLINICIAN: Dr Leonie Man CLINICAL HISTORY:  67 year patient with silent lacunar infarcts COMPARISON FILMS:  None EXAM: MRA brain wo TECHNIQUE: MR angiogram of the head was obtained utilizing 3D time of flight sequences from below the vertebrobasilar junction up to the intracranial vasculature without contrast.  Computerized reconstructions were obtained. CONTRAST: none IMAGING SITE: [facility] FINDINGS: Both internal carotid arteries demonstrate normal flow and caliber in depth petrous, cavernous and terminal supraclinoid portions. Right middle cerebral artery show normal flow and caliber. Both anterior cerebral arteries appear to fill from the right and the A1 segment of the left anterior cerebral artery appears to be hypoplastic which is a birth variants. The right vertebral artery is dominant and left vertebral artery appears to  be hypoplastic in its terminal portion but is patent. Both posterior cerebral arteries arise from the basilar artery and show no significant stenosis. There are no definite visualized aneurysms aneurysms smaller than 3 mm in size are not adequately visualized on MRA due to technical limitations    Unremarkable bowel MRA of the brain show no significant stenosis of large and medium size intracranial vessels. 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 [address] [phone] NEUROIMAGING REPORT STUDY DATE: 10/20/2016 PATIENT NAME: JOLYNN CLOUATRE DOB: 07-05-1936 MRN: [id number] ORDERING CLINICIAN: Dr Leonie Man CLINICAL HISTORY:  19 year lady with silent infarcts COMPARISON FILMS: none EXAM: MRA neck w/wo TECHNIQUE: MR angiogram of the neck was obtained utilizing 3D time-of-flight sequences from the aortic arch up to the intracranial vasculature postbolus contrast infusion.  2D time-of-flight noncontrast views and computerized reconstructions were obtained. CONTRAST: 7 ml iv  multihance IMAGING SITE: [facility] FINDINGS: The great vessels of the neck show normal pattern of origin from the aorta. The right common carotid artery and carotid bifurcation appear widely patent. Both right internal and external carotid arteries appear normal. Left common carotid artery appears normal. Left carotid bifurcation is patent but that there is a focal area of high-grade stenosis of 90% involving proximal left internal carotid artery with a kink but  with good flow beyond it. Both vertebral arteries have antegrade flow to the right appears to be the dominant one.     Abnormal MRA of the neck w/wo contrast showing high-grade proximal left ICA stenosis of 90% with preserved distal flow. Both vertebral arteries have antegrade flow. INTERPRETING PHYSICIAN: Antony Contras, MD Certified in  Neuroimaging by [place] [employer] [facility] and [place] for Neurological Subspecialities   Mr Brain Wo Contrast  Result Date: 11/11/2016 CLINICAL DATA:  Acute encephalopathy EXAM: MRI HEAD WITHOUT CONTRAST TECHNIQUE: Multiplanar, multiecho pulse sequences of the brain and surrounding structures were obtained without intravenous contrast. COMPARISON:  CT head 11/10/2016 FINDINGS: Brain: Generalized atrophy. Negative for hydrocephalus. Negative for acute infarct. Scattered small white matter hyperintensities consistent with chronic microvascular ischemia. Mild chronic ischemia in the central pons. Negative for hemorrhage or mass. No shift of the midline structures. Vascular: Normal arterial flow voids. Skull and upper cervical spine: Negative Sinuses/Orbits: Bilateral lens replacement. Paranasal sinuses clear. Other: None IMPRESSION: Atrophy and mild chronic microvascular ischemic change. No acute abnormality. Electronically Signed   By: Franchot Gallo M.D.   On: 11/11/2016 06:56   Dg Chest Port 1 View  Result Date: 11/12/2016 CLINICAL DATA:  Fever.  Confusion. EXAM: PORTABLE CHEST 1 VIEW COMPARISON:  11/11/2016 FINDINGS: Chronic cardiomegaly. Stable aortic and hilar contours. Low volume chest with interstitial crowding. No edema, effusion, or air bronchogram. Previous spinal, left glenohumeral, and left clavicle surgery with hardware. IMPRESSION: Limited low volume chest without acute or focal finding. Electronically Signed   By: Monte Fantasia M.D.   On: 11/12/2016 09:51   Dg Chest Port 1 View  Result Date: 11/11/2016 CLINICAL DATA:  Fever.  Possible code stroke . EXAM: PORTABLE CHEST 1 VIEW COMPARISON:  09/06/2016 . FINDINGS: Mediastinum and hilar structures normal.  Stable mild cardiomegaly. Mild bilateral pulmonary interstitial prominence. Pneumonitis cannot be excluded. No pleural effusion or pneumothorax. ORIF left clavicle again noted. Left shoulder replacement again noted. Prior thoracolumbar spine fusion. Prior cholecystectomy. IMPRESSION: Mild bilateral pulmonary interstitial prominence consistent with mild pneumonitis. Electronically Signed   By: Marcello Moores  Register   On: 11/11/2016 12:08   Dg Abd Portable 2v  Result Date: 11/11/2016 CLINICAL DATA:  Vomiting EXAM: PORTABLE ABDOMEN - 2 VIEW COMPARISON:  09/08/2014 FINDINGS: Streaky atelectasis or possible scarring at the left lung base. Stable mild elevation of the right diaphragm. No free air beneath the diaphragm. Surgical clips in the right upper quadrant. Overall nonobstructed bowel-gas pattern with mild fecal impaction in the rectum. Surgical coils over the right abdomen. Similar appearance of posterior spinal stabilization rods and fixating screws. IMPRESSION: Nonobstructed bowel gas pattern with mild fecal impaction in the rectum. Electronically Signed   By: Donavan Foil M.D.   On: 11/11/2016 18:41   Ct Head Code Stroke W/o Cm  Addendum Date: 11/10/2016   ADDENDUM REPORT: 11/10/2016 22:21 ADDENDUM: These results were called by telephone at the time of interpretation on 11/10/2016 at 10:20 pm to Dr. Carmin Muskrat , who verbally acknowledged these results. Electronically Signed   By: Edgardo Roys.D.  On: 11/10/2016 22:21   Result Date: 11/10/2016 CLINICAL DATA:  Code stroke.  Acute confusion and weakness. EXAM: CT HEAD WITHOUT CONTRAST TECHNIQUE: Contiguous axial images were obtained from the base of the skull through the vertex without intravenous contrast. COMPARISON:  09/06/2016 CT of the head. FINDINGS: Brain: No evidence of acute infarction, hemorrhage, hydrocephalus, extra-axial collection or mass lesion/mass effect. Few stable nonspecific foci of white matter hypoattenuation are  compatible with mild chronic microvascular ischemic changes. Mild brain parenchymal volume loss. Stable lucency in pons compatible with chronic lacunar infarct. Vascular: No hyperdense vessel. Calcific atherosclerosis of cavernous internal carotid arteries. Skull: Normal. Negative for fracture or focal lesion. Sinuses/Orbits: No acute finding. Other: None. ASPECTS [facility] Stroke Program Early CT Score) - Ganglionic level infarction (caudate, lentiform nuclei, internal capsule, insula, M1-M3 cortex): 7 - Supraganglionic infarction (M4-M6 cortex): 3 Total score (0-10 with 10 being normal): 10 IMPRESSION: 1. No acute intracranial abnormality. 2. Mild chronic microvascular ischemic changes and brain parenchymal volume loss. 3. ASPECTS is 10 Electronically Signed: By: Kristine Garbe M.D. On: 11/10/2016 22:13   Dg Fluoro Guide Lumbar Puncture  Result Date: 11/12/2016 CLINICAL DATA:  Acute encephalopathy in altered mental status. EXAM: DIAGNOSTIC LUMBAR PUNCTURE UNDER FLUOROSCOPIC GUIDANCE FLUOROSCOPY TIME:  Fluoroscopy Time:  0 minutes, 36 seconds Radiation Exposure Index (if provided by the fluoroscopic device): 2.8 mGy Number of Acquired Spot Images: 0 PROCEDURE: I discussed the risks (including hemorrhage, infection, headache, and nerve damage, among others), benefits, and alternatives to fluoroscopically guided lumbar puncture with the patient's husband, who is making her medical decisions at this time. We specifically discussed the high technical likelihood of success of the procedure. Mr. Wishon understood and elected for the patient to undergo the procedure. Standard time-out was employed. Following sterile skin prep and local anesthetic administration consisting of 1 percent lidocaine, a 22 gauge spinal needle was advanced without difficulty into the thecal sac at the at the L2-3 level. Clear CSF was returned. Opening pressure was 18 cm of water. However, this estimate was made with the patient  supine, as I did not feel it was safe to try to turn her on her side due to the altered mental status. 13 cc of CSF was collected. The first 2 both blood tinged, but this cleared on the subsequent tubes. The needle was subsequently removed and the skin cleansed and bandaged. No immediate complications were observed. IMPRESSION: 1. Technically successful fluoroscopically guided lumbar puncture, yielding 13 cc of CSF. No immediate complications observed. Electronically Signed   By: Van Clines M.D.   On: 11/12/2016 16:51       Subjective: Feeling better. Alert and oriented.    Discharge Exam: Vitals:   11/18/16 0946 11/18/16 1336  BP: (!) 142/48 (!) 127/48  Pulse: 73 68  Resp: 18 18  Temp: 99.3 F (37.4 C) 98.4 F (36.9 C)   Vitals:   11/18/16 0200 11/18/16 0514 11/18/16 0946 11/18/16 1336  BP: (!) 162/64 (!) 120/54 (!) 142/48 (!) 127/48  Pulse:  61 73 68  Resp:  18 18 18   Temp:  98.6 F (37 C) 99.3 F (37.4 C) 98.4 F (36.9 C)  TempSrc:  Oral Oral Oral  SpO2:  97% 94% 96%  Weight:      Height:        General: Pt is alert, awake, not in acute distress Cardiovascular: RRR, S1/S2 +, no rubs, no gallops Respiratory: CTA bilaterally, no wheezing, no rhonchi Abdominal: Soft, NT, ND, bowel sounds + Extremities: no  edema, no cyanosis    The results of significant diagnostics from this hospitalization (including imaging, microbiology, ancillary and laboratory) are listed below for reference.     Microbiology: Recent Results (from the past 240 hour(s))  Culture, blood (Routine X 2) w Reflex to ID Panel     Status: None   Collection Time: 11/11/16 12:20 PM  Result Value Ref Range Status   Specimen Description BLOOD RIGHT ANTECUBITAL  Final   Special Requests BOTTLES DRAWN AEROBIC AND ANAEROBIC  10CC  Final   Culture NO GROWTH 5 DAYS  Final   Report Status 11/16/2016 FINAL  Final  Culture, blood (Routine X 2) w Reflex to ID Panel     Status: None   Collection Time:  11/11/16 12:30 PM  Result Value Ref Range Status   Specimen Description BLOOD RIGHT HAND  Final   Special Requests BOTTLES DRAWN AEROBIC AND ANAEROBIC  5CC  Final   Culture NO GROWTH 5 DAYS  Final   Report Status 11/16/2016 FINAL  Final  Culture, Urine     Status: None   Collection Time: 11/12/16  7:57 AM  Result Value Ref Range Status   Specimen Description URINE, RANDOM  Final   Special Requests NONE  Final   Culture NO GROWTH  Final   Report Status 11/13/2016 FINAL  Final  Culture, blood (single)     Status: None   Collection Time: 11/12/16  9:03 AM  Result Value Ref Range Status   Specimen Description BLOOD LEFT HAND  Final   Special Requests IN PEDIATRIC BOTTLE 2CC  Final   Culture NO GROWTH 6 DAYS  Final   Report Status 11/18/2016 FINAL  Final  Anaerobic culture     Status: None   Collection Time: 11/12/16  4:00 PM  Result Value Ref Range Status   Specimen Description CSF  Final   Special Requests NONE  Final   Culture NO ANAEROBES ISOLATED  Final   Report Status 11/17/2016 FINAL  Final  CSF culture     Status: None   Collection Time: 11/12/16  4:00 PM  Result Value Ref Range Status   Specimen Description CSF  Final   Special Requests NONE  Final   Gram Stain   Final    WBC PRESENT, PREDOMINANTLY PMN NO ORGANISMS SEEN CYTOSPIN SMEAR    Culture NO GROWTH 3 DAYS  Final   Report Status 11/16/2016 FINAL  Final  Fungus Stain     Status: None   Collection Time: 11/12/16  4:00 PM  Result Value Ref Range Status   FUNGUS STAIN Final report  Final    Comment: (NOTE) Performed At: [facility] [place], [place] [id number] Lindon Romp MD [id number]    Fungal Source CSF  Final  Fungal Stain reflex     Status: None   Collection Time: 11/12/16  4:00 PM  Result Value Ref Range Status   Fungal stain result 1 Comment  Final    Comment: (NOTE) KOH/Calcofluor preparation:  no fungus observed. Performed At: [facility] [place], [place] [id number] Lindon Romp MD [id number]      Labs: BNP (last 3 results)  Recent Labs  11/10/16 2346  BNP [id number]*   Basic Metabolic Panel:  Recent Labs Lab 11/12/16 0530 11/13/16 0522 11/14/16 0220 11/16/16 1028 11/17/16 0731  NA 145 148* 140 142 141  K 3.0* 3.1* 3.4* 3.8 3.7  CL 108 115* 110 110 112*  CO2 24 22  22 25 24   GLUCOSE 146* 107* 130* 124* 102*  BUN 11 13 11 8 14   CREATININE 1.03* 0.89 0.86 0.88 0.82  CALCIUM 8.7* 8.1* 8.2* 8.9 8.4*  MG  --  2.3  --   --   --    Liver Function Tests:  Recent Labs Lab 11/12/16 0530 11/13/16 0522  AST 25 18  ALT 17 15  ALKPHOS 83 68  BILITOT 0.9 0.6  PROT 6.3* 5.0*  ALBUMIN 3.3* 2.5*   No results for input(s): LIPASE, AMYLASE in the last 168 hours. No results for input(s): AMMONIA in the last 168 hours. CBC:  Recent Labs Lab 11/12/16 0530 11/13/16 0522 11/14/16 0220 11/16/16 0556 11/17/16 0731  WBC 14.3* 9.4 11.7* 9.9 13.2*  HGB 11.9* 10.7* 10.9* 11.0* 10.3*  HCT 35.9* 33.8* 33.3* 34.3* 32.1*  MCV 89.8 93.6 91.2 93.0 93.3  PLT 232 170 201 196 179   Cardiac Enzymes: No results for input(s): CKTOTAL, CKMB, CKMBINDEX, TROPONINI in the last 168 hours. BNP: Invalid input(s): POCBNP CBG:  Recent Labs Lab 11/14/16 0655 11/15/16 0634 11/16/16 0630 11/17/16 0554 11/18/16 0615  GLUCAP 127* 146* 110* 110* 114*   D-Dimer No results for input(s): DDIMER in the last 72 hours. Hgb A1c No results for input(s): HGBA1C in the last 72 hours. Lipid Profile No results for input(s): CHOL, HDL, LDLCALC, TRIG, CHOLHDL, LDLDIRECT in the last 72 hours. Thyroid function studies No results for input(s): TSH, T4TOTAL, T3FREE, THYROIDAB in the last 72 hours.  Invalid input(s): FREET3 Anemia work up No results for input(s): VITAMINB12, FOLATE, FERRITIN, TIBC, IRON, RETICCTPCT in the last 72 hours. Urinalysis    Component Value Date/Time   COLORURINE YELLOW 11/11/2016 0418   APPEARANCEUR CLEAR  11/11/2016 0418   LABSPEC 1.010 11/11/2016 0418   PHURINE 6.0 11/11/2016 0418   GLUCOSEU NEGATIVE 11/11/2016 0418   HGBUR TRACE (A) 11/11/2016 0418   BILIRUBINUR NEGATIVE 11/11/2016 0418   KETONESUR NEGATIVE 11/11/2016 0418   PROTEINUR 100 (A) 11/11/2016 0418   UROBILINOGEN 0.2 09/04/2014 1340   NITRITE NEGATIVE 11/11/2016 0418   LEUKOCYTESUR NEGATIVE 11/11/2016 0418   Sepsis Labs Invalid input(s): PROCALCITONIN,  WBC,  LACTICIDVEN Microbiology Recent Results (from the past 240 hour(s))  Culture, blood (Routine X 2) w Reflex to ID Panel     Status: None   Collection Time: 11/11/16 12:20 PM  Result Value Ref Range Status   Specimen Description BLOOD RIGHT ANTECUBITAL  Final   Special Requests BOTTLES DRAWN AEROBIC AND ANAEROBIC  10CC  Final   Culture NO GROWTH 5 DAYS  Final   Report Status 11/16/2016 FINAL  Final  Culture, blood (Routine X 2) w Reflex to ID Panel     Status: None   Collection Time: 11/11/16 12:30 PM  Result Value Ref Range Status   Specimen Description BLOOD RIGHT HAND  Final   Special Requests BOTTLES DRAWN AEROBIC AND ANAEROBIC  5CC  Final   Culture NO GROWTH 5 DAYS  Final   Report Status 11/16/2016 FINAL  Final  Culture, Urine     Status: None   Collection Time: 11/12/16  7:57 AM  Result Value Ref Range Status   Specimen Description URINE, RANDOM  Final   Special Requests NONE  Final   Culture NO GROWTH  Final   Report Status 11/13/2016 FINAL  Final  Culture, blood (single)     Status: None   Collection Time: 11/12/16  9:03 AM  Result Value Ref Range Status   Specimen Description BLOOD  LEFT HAND  Final   Special Requests IN PEDIATRIC BOTTLE [facility]  Final   Culture NO GROWTH 6 DAYS  Final   Report Status 11/18/2016 FINAL  Final  Anaerobic culture     Status: None   Collection Time: 11/12/16  4:00 PM  Result Value Ref Range Status   Specimen Description CSF  Final   Special Requests NONE  Final   Culture NO ANAEROBES ISOLATED  Final   Report Status  11/17/2016 FINAL  Final  CSF culture     Status: None   Collection Time: 11/12/16  4:00 PM  Result Value Ref Range Status   Specimen Description CSF  Final   Special Requests NONE  Final   Gram Stain   Final    WBC PRESENT, PREDOMINANTLY PMN NO ORGANISMS SEEN CYTOSPIN SMEAR    Culture NO GROWTH 3 DAYS  Final   Report Status 11/16/2016 FINAL  Final  Fungus Stain     Status: None   Collection Time: 11/12/16  4:00 PM  Result Value Ref Range Status   FUNGUS STAIN Final report  Final    Comment: (NOTE) Performed At: [facility] [place], [place] [id number] Lindon Romp MD [id number]    Fungal Source CSF  Final  Fungal Stain reflex     Status: None   Collection Time: 11/12/16  4:00 PM  Result Value Ref Range Status   Fungal stain result 1 Comment  Final    Comment: (NOTE) KOH/Calcofluor preparation:  no fungus observed. Performed At: [facility] [place], [place] [id number] Lindon Romp MD [id number]      Time coordinating discharge: Over 30 minutes  SIGNED:   Elmarie Shiley, MD  [facility] 11/18/2016, 1:58 PM Pager [phone]  If 7PM-7AM, please contact night-coverage [email or web address] Password [email or web address]

## 2016-11-18 NOTE — Care Management (Signed)
CM spoke to Dr Louretta Shorten and he feels the patient no longer needs inpatient geripsyche. He states she is appropriate for a regular SNF discharge.

## 2016-11-18 NOTE — Clinical Social Work Note (Signed)
Clinical Social Work Assessment  Patient Details  Name: [name] [name] MRN: [id number] Date of Birth: 24-Feb-1936  Date of referral:  11/17/16               Reason for consult:  Facility Placement                Permission sought to share information with:  Family Supports Permission granted to share information::  Yes, Verbal Permission Granted  Name::     [name] [name] and [name] [name]::     Relationship::  Husband and daughter  Sport and exercise psychologist Information:  [phone] and [phone] (dau.-mobile)  Housing/Transportation Living arrangements for the past 2 months:  Single Family Home Source of Information:  Patient, Adult Children, Spouse Patient Interpreter Needed:  None Criminal Activity/Legal Involvement Pertinent to Current Situation/Hospitalization:  No - Comment as needed Significant Relationships:  Adult Children, Spouse Lives with:  Spouse Do you feel safe going back to the place where you live?  No (Patient and family in agreement that ST rehab needed prior to returinng home) Need for family participation in patient care:  Yes (Comment)  Care giving concerns:  Patient understands that rehab needed as her husband unable to care for her at home.   Social Worker assessment / plan:  CSW talked with patient, her spouse and daughter at the bedside regarding discharge disposition and recommendation of ST rehab. Patient was sitting up in a chair and was alert, oriented and pleasant during conversation with CSW. [name] [name] in agreement and requested [facility] as she has been there before.  Employment status:  Retired Forensic scientist:  Medicare PT Recommendations:  [place] / Referral to community resources:  Other (Comment Required) (SNF list not needed as patient as facility prefefrence)  Patient/Family's Response to care:  No concerns expressed regarding care during hospitalization.  Patient/Family's Understanding of and  Emotional Response to Diagnosis, Current Treatment, and Prognosis:  Not discussed.  Emotional Assessment Appearance:  Appears stated age Attitude/Demeanor/Rapport:  Other (appropriate) Affect (typically observed):  Appropriate Orientation:  Oriented to Self, Oriented to Place, Oriented to  Time, Oriented to Situation Alcohol / Substance use:  Alcohol Use (Patient reports that she does not smoke, drinks 0.6 ouces of alcohol per week and does not smoke) Psych involvement (Current and /or in the community):  No (Comment)  Discharge Needs  Concerns to be addressed:  Discharge Planning Concerns Readmission within the last 30 days:  No Current discharge risk:  None Barriers to Discharge:  No Barriers Identified   [name] Feil, LCSW 11/18/2016, 11:52 AM

## 2016-11-23 ENCOUNTER — Other Ambulatory Visit: Payer: Self-pay

## 2016-11-23 DIAGNOSIS — I6522 Occlusion and stenosis of left carotid artery: Secondary | ICD-10-CM

## 2016-12-01 ENCOUNTER — Telehealth: Payer: Self-pay | Admitting: Neurology

## 2016-12-01 NOTE — Telephone Encounter (Signed)
Dr.Sethi just FYI.

## 2016-12-01 NOTE — Telephone Encounter (Signed)
Barbara/[facility] called to advise the pt c/a her against medical advise to keep it  [facility]

## 2016-12-03 DIAGNOSIS — G9340 Encephalopathy, unspecified: Secondary | ICD-10-CM | POA: Diagnosis not present

## 2016-12-03 DIAGNOSIS — I10 Essential (primary) hypertension: Secondary | ICD-10-CM | POA: Diagnosis not present

## 2016-12-03 DIAGNOSIS — E7800 Pure hypercholesterolemia, unspecified: Secondary | ICD-10-CM | POA: Diagnosis not present

## 2016-12-03 DIAGNOSIS — R569 Unspecified convulsions: Secondary | ICD-10-CM | POA: Diagnosis not present

## 2016-12-03 DIAGNOSIS — G4700 Insomnia, unspecified: Secondary | ICD-10-CM | POA: Diagnosis not present

## 2016-12-03 DIAGNOSIS — N183 Chronic kidney disease, stage 3 (moderate): Secondary | ICD-10-CM | POA: Diagnosis not present

## 2016-12-03 DIAGNOSIS — F419 Anxiety disorder, unspecified: Secondary | ICD-10-CM | POA: Diagnosis not present

## 2016-12-03 DIAGNOSIS — E039 Hypothyroidism, unspecified: Secondary | ICD-10-CM | POA: Diagnosis not present

## 2016-12-13 DIAGNOSIS — I6529 Occlusion and stenosis of unspecified carotid artery: Secondary | ICD-10-CM

## 2016-12-13 HISTORY — DX: Occlusion and stenosis of unspecified carotid artery: I65.29

## 2016-12-15 ENCOUNTER — Encounter: Payer: Self-pay | Admitting: Vascular Surgery

## 2016-12-15 ENCOUNTER — Ambulatory Visit (INDEPENDENT_AMBULATORY_CARE_PROVIDER_SITE_OTHER): Payer: Medicare Other | Admitting: Vascular Surgery

## 2016-12-15 ENCOUNTER — Ambulatory Visit (HOSPITAL_COMMUNITY)
Admission: RE | Admit: 2016-12-15 | Discharge: 2016-12-15 | Disposition: A | Discharge status: Home / Self Care | Payer: Medicare Other | Source: Ambulatory Visit | Attending: Vascular Surgery | Admitting: Vascular Surgery

## 2016-12-15 ENCOUNTER — Other Ambulatory Visit: Payer: Self-pay

## 2016-12-15 VITALS — BP 148/70 | HR 55 | Temp 97.9°F | Resp 18 | Ht 61.0 in | Wt 147.8 lb

## 2016-12-15 DIAGNOSIS — I6522 Occlusion and stenosis of left carotid artery: Secondary | ICD-10-CM | POA: Diagnosis not present

## 2016-12-15 DIAGNOSIS — I6523 Occlusion and stenosis of bilateral carotid arteries: Secondary | ICD-10-CM | POA: Diagnosis not present

## 2016-12-15 LAB — VAS US CAROTID
LCCADDIAS: 20 cm/s
LCCAPDIAS: 13 cm/s
LEFT ECA DIAS: -13 cm/s
LEFT VERTEBRAL DIAS: 17 cm/s
LICADDIAS: -23 cm/s
LICADSYS: -88 cm/s
Left CCA dist sys: 96 cm/s
Left CCA prox sys: 126 cm/s
RIGHT CCA MID DIAS: 24 cm/s
RIGHT ECA DIAS: -12 cm/s
RIGHT VERTEBRAL DIAS: 17 cm/s
Right CCA prox dias: -14 cm/s
Right CCA prox sys: -142 cm/s
Right cca dist sys: -92 cm/s

## 2016-12-15 NOTE — Progress Notes (Signed)
Patient name: [name] [name] MRN: [id number] DOB: 1936-01-25 Sex: female  REASON FOR CONSULT: Left carotid stenosis. Referred by Dr. Leonie Man.  HPI: [name] [name] is a 81 y.o. female, who was admitted on 11/10/2016 with altered mental status and seizure activity. MRI of the brain on 11/11/2016 showed atrophy and some mild chronic microvascular ischemic changes. There was no acute abnormality noted. MR angiogram showed a 90% left carotid stenosis. The patient sent for carotid evaluation.  The patient is right-handed. She denies any history of stroke, TIAs, expressive or receptive aphasia, or amaurosis fugax. She is on aspirin and is on a statin. She is not a smoker.  She has complained of some frontal headaches for several months.  She transiently had some chest pain when she was coming off of Xanax but this has resolved.  Past Medical History:  Diagnosis Date  . Anemia    hx of years ago   . Anxiety   . ARF (acute renal failure) ([place]) 08/23/2014  . Arthritis    psoriatic arthritis  . Back pain   . Blood transfusion    at age of 4  . Chronic diastolic CHF (congestive heart failure) ([place])   . Depression   . GERD (gastroesophageal reflux disease)   . H/O hiatal hernia   . Hyperlipidemia   . Hypertension   . Hypothyroidism   . IBS (irritable bowel syndrome)   . Pancreatitis   . PONV (postoperative nausea and vomiting)   . Thyroid disease     Family History  Problem Relation Age of Onset  . Heart disease Mother   . Cancer Father     lung    SOCIAL HISTORY: Social History   Social History  . Marital status: Married    Spouse name: N/A  . Number of children: N/A  . Years of education: N/A   Occupational History  . Not on file.   Social History Main Topics  . Smoking status: Never Smoker  . Smokeless tobacco: Never Used  . Alcohol use 0.6 oz/week    1 Glasses of wine per week     Comment: rare  . Drug use:     Types: Benzodiazepines  . Sexual activity: Not  Currently   Other Topics Concern  . Not on file   Social History Narrative  . No narrative on file    Allergies  Allergen Reactions  . Tetanus Toxoids Swelling  . Yellow Dyes (Non-Tartrazine) Itching and Other (See Comments)    Makes patient nervous     Current Outpatient Prescriptions  Medication Sig Dispense Refill  . acetaminophen (TYLENOL 8 HOUR ARTHRITIS PAIN) 650 MG CR tablet Take 650 mg by mouth every 8 (eight) hours as needed for pain.    [name] amLODipine (NORVASC) 2.5 MG tablet Take 1 tablet (2.5 mg total) by mouth daily. 30 tablet 0  . aspirin 325 MG tablet Take 1 tablet (325 mg total) by mouth daily. 30 tablet 0  . atenolol (TENORMIN) 100 MG tablet Take 1 tablet (100 mg total) by mouth every morning. 30 tablet 0  . clonazePAM (KLONOPIN) 0.5 MG tablet Take 1 tablet (0.5 mg total) by mouth 2 (two) times daily. 30 tablet 0  . diltiazem (CARDIZEM CD) 180 MG 24 hr capsule Take 1 capsule (180 mg total) by mouth daily. 30 capsule 0  . esomeprazole (NEXIUM) 40 MG capsule Take 40 mg by mouth daily.    . folic acid (FOLVITE) 1 MG tablet Take 1 tablet (  1 mg total) by mouth daily. 30 tablet 0  . furosemide (LASIX) 20 MG tablet Take 20 mg by mouth daily with breakfast.     . gabapentin (NEURONTIN) 400 MG capsule Take 400 mg by mouth 6 (six) times daily. As needed for nerve pain    . levothyroxine (SYNTHROID, LEVOTHROID) 75 MCG tablet Take 75 mcg by mouth daily before breakfast.     . Multiple Vitamin (MULTIVITAMIN WITH MINERALS) TABS tablet Take 1 tablet by mouth daily as needed (for supplementation).     . potassium chloride SA (K-DUR,KLOR-CON) 20 MEQ tablet Take 40 mEq by mouth daily.     . QUEtiapine (SEROQUEL) 25 MG tablet Take 1 tablet (25 mg total) by mouth at bedtime. 30 tablet 0  . saccharomyces boulardii (FLORASTOR) 250 MG capsule Take 1 capsule (250 mg total) by mouth 2 (two) times daily. 60 capsule 0  . simvastatin (ZOCOR) 10 MG tablet Take 10 mg by mouth at bedtime.    .  thiamine 100 MG tablet Take 1 tablet (100 mg total) by mouth daily. 30 tablet 0  . venlafaxine XR (EFFEXOR-XR) 75 MG 24 hr capsule Take 75 mg by mouth daily with breakfast.     . zolpidem (AMBIEN) 5 MG tablet Take 5 mg by mouth at bedtime as needed for sleep.    . feeding supplement, ENSURE ENLIVE, (ENSURE ENLIVE) LIQD Take 237 mLs by mouth 2 (two) times daily between meals. (Patient not taking: Reported on 12/15/2016) 237 mL 12  . hyoscyamine (LEVBID) 0.375 MG 12 hr tablet Take 0.375 mg by mouth every 12 (twelve) hours as needed for cramping.      No current facility-administered medications for this visit.     REVIEW OF SYSTEMS:  [X]  denotes positive finding, [ ]  denotes negative finding Cardiac  Comments:  Chest pain or chest pressure: X   Shortness of breath upon exertion:    Short of breath when lying flat:    Irregular heart rhythm:        Vascular    Pain in calf, thigh, or hip brought on by ambulation:    Pain in feet at night that wakes you up from your sleep:     Blood clot in your veins:    Leg swelling:         Pulmonary    Oxygen at home:    Productive cough:     Wheezing:         Neurologic    Sudden weakness in arms or legs:     Sudden numbness in arms or legs:     Sudden onset of difficulty speaking or slurred speech:    Temporary loss of vision in one eye:     Problems with dizziness:         Gastrointestinal    Blood in stool:     Vomited blood:         Genitourinary    Burning when urinating:     Blood in urine:        Psychiatric    Major depression:  X       Hematologic    Bleeding problems:    Problems with blood clotting too easily:        Skin    Rashes or ulcers:        Constitutional    Fever or chills:      PHYSICAL EXAM: Vitals:   12/15/16 1219 12/15/16 1222  BP: 137/68 (!) 148/70  Pulse: [name])  55   Resp: 18   Temp: 97.9 F (36.6 C)   TempSrc: Oral   SpO2: 96%   Weight: 147 lb 12.8 oz (67 kg)   Height: 5\' 1"  (1.549 m)      GENERAL: The patient is a well-nourished female, in no acute distress. The vital signs are documented above. CARDIAC: There is a regular rate and rhythm.  VASCULAR: She has a left carotid bruit. She has mild bilateral lower extremity swelling. PULMONARY: There is good air exchange bilaterally without wheezing or rales. ABDOMEN: Soft and non-tender with normal pitched bowel sounds.  MUSCULOSKELETAL: There are no major deformities or cyanosis. NEUROLOGIC: No focal weakness or paresthesias are detected. SKIN: There are no ulcers or rashes noted. PSYCHIATRIC: The patient has a normal affect.  DATA:   CAROTID DUPLEX: I have independently interpreted her carotid duplex scan today.   On the left side, there is a greater than 80% left carotid stenosis. The ICA appears normal beyond the stenosis. The bifurcation is noted to be somewhat high.  There is a less than 40% carotid stenosis on the right. Both vertebral arteries are patent with antegrade flow.  MR ANGIOGRAM NECK: I have reviewed the MR angiogram of the neck that was done on 10/19/2016. This was a workup for silent infarcts. This shows a high-grade proximal left internal carotid artery stenosis of 90%.  MEDICAL ISSUES:  ASYMPTOMATIC 90% LEFT CAROTID STENOSIS: Given the severity of stenosis and risk of stroke I have recommended left carotid endarterectomy. I have reviewed the indications for carotid endarterectomy, that is to lower the risk of future stroke. I have also reviewed the potential complications of surgery, including but not limited to: bleeding, stroke (perioperative risk 1-2%), MI, nerve injury of other unpredictable medical problems. All of the patients questions were answered and they are agreeable to proceed with surgery. She knows to continue her aspirin perioperatively. Her surgery is scheduled for 12/28/2016.  Deitra Mayo [facility] [phone]

## 2016-12-16 ENCOUNTER — Ambulatory Visit: Payer: Medicare Other | Admitting: Neurology

## 2016-12-22 ENCOUNTER — Encounter: Payer: Medicare Other | Admitting: Vascular Surgery

## 2016-12-22 ENCOUNTER — Encounter (HOSPITAL_COMMUNITY): Payer: Medicare Other

## 2016-12-23 ENCOUNTER — Encounter (HOSPITAL_COMMUNITY)
Admission: RE | Admit: 2016-12-23 | Discharge: 2016-12-23 | Disposition: A | Discharge status: Home / Self Care | Payer: Medicare Other | Source: Ambulatory Visit | Attending: Vascular Surgery | Admitting: Vascular Surgery

## 2016-12-23 ENCOUNTER — Encounter (HOSPITAL_COMMUNITY): Payer: Self-pay

## 2016-12-23 DIAGNOSIS — K567 Ileus, unspecified: Secondary | ICD-10-CM | POA: Diagnosis not present

## 2016-12-23 DIAGNOSIS — F419 Anxiety disorder, unspecified: Secondary | ICD-10-CM | POA: Insufficient documentation

## 2016-12-23 DIAGNOSIS — G9340 Encephalopathy, unspecified: Secondary | ICD-10-CM | POA: Insufficient documentation

## 2016-12-23 DIAGNOSIS — F10231 Alcohol dependence with withdrawal delirium: Secondary | ICD-10-CM | POA: Insufficient documentation

## 2016-12-23 DIAGNOSIS — R4182 Altered mental status, unspecified: Secondary | ICD-10-CM | POA: Diagnosis not present

## 2016-12-23 DIAGNOSIS — K458 Other specified abdominal hernia without obstruction or gangrene: Secondary | ICD-10-CM | POA: Insufficient documentation

## 2016-12-23 DIAGNOSIS — D689 Coagulation defect, unspecified: Secondary | ICD-10-CM | POA: Insufficient documentation

## 2016-12-23 DIAGNOSIS — Z01812 Encounter for preprocedural laboratory examination: Secondary | ICD-10-CM | POA: Insufficient documentation

## 2016-12-23 DIAGNOSIS — I480 Paroxysmal atrial fibrillation: Secondary | ICD-10-CM | POA: Insufficient documentation

## 2016-12-23 DIAGNOSIS — D72829 Elevated white blood cell count, unspecified: Secondary | ICD-10-CM | POA: Diagnosis not present

## 2016-12-23 DIAGNOSIS — K830 Cholangitis: Secondary | ICD-10-CM | POA: Diagnosis not present

## 2016-12-23 DIAGNOSIS — K219 Gastro-esophageal reflux disease without esophagitis: Secondary | ICD-10-CM | POA: Insufficient documentation

## 2016-12-23 DIAGNOSIS — I10 Essential (primary) hypertension: Secondary | ICD-10-CM | POA: Diagnosis not present

## 2016-12-23 DIAGNOSIS — I6522 Occlusion and stenosis of left carotid artery: Secondary | ICD-10-CM | POA: Insufficient documentation

## 2016-12-23 HISTORY — DX: Unspecified convulsions: R56.9

## 2016-12-23 HISTORY — DX: Cerebral infarction, unspecified: I63.9

## 2016-12-23 HISTORY — DX: Nonscarring hair loss, unspecified: L65.9

## 2016-12-23 HISTORY — DX: Peripheral vascular disease, unspecified: I73.9

## 2016-12-23 HISTORY — DX: Cardiac arrhythmia, unspecified: I49.9

## 2016-12-23 HISTORY — DX: Rosacea, unspecified: L71.9

## 2016-12-23 LAB — CBC
HCT: 36.6 % (ref 36.0–46.0)
HEMOGLOBIN: 11.6 g/dL — AB (ref 12.0–15.0)
MCH: 28 pg (ref 26.0–34.0)
MCHC: 31.7 g/dL (ref 30.0–36.0)
MCV: 88.4 fL (ref 78.0–100.0)
PLATELETS: 231 10*3/uL (ref 150–400)
RBC: 4.14 MIL/uL (ref 3.87–5.11)
RDW: 13.3 % (ref 11.5–15.5)
WBC: 8 10*3/uL (ref 4.0–10.5)

## 2016-12-23 LAB — COMPREHENSIVE METABOLIC PANEL
ALK PHOS: 91 U/L (ref 38–126)
ALT: 13 U/L — ABNORMAL LOW (ref 14–54)
ANION GAP: 11 (ref 5–15)
AST: 18 U/L (ref 15–41)
Albumin: 3.6 g/dL (ref 3.5–5.0)
BUN: 24 mg/dL — ABNORMAL HIGH (ref 6–20)
CALCIUM: 8.9 mg/dL (ref 8.9–10.3)
CO2: 23 mmol/L (ref 22–32)
Chloride: 106 mmol/L (ref 101–111)
Creatinine, Ser: 1.1 mg/dL — ABNORMAL HIGH (ref 0.44–1.00)
GFR calc non Af Amer: 47 mL/min — ABNORMAL LOW (ref >60)
GFR, EST AFRICAN AMERICAN: 54 mL/min — AB (ref >60)
Glucose, Bld: 103 mg/dL — ABNORMAL HIGH (ref 65–99)
POTASSIUM: 4.3 mmol/L (ref 3.5–5.1)
SODIUM: 140 mmol/L (ref 135–145)
TOTAL PROTEIN: 6.8 g/dL (ref 6.5–8.1)
Total Bilirubin: 1 mg/dL (ref 0.3–1.2)

## 2016-12-23 LAB — URINALYSIS, ROUTINE W REFLEX MICROSCOPIC
BILIRUBIN URINE: NEGATIVE
Glucose, UA: NEGATIVE mg/dL
Hgb urine dipstick: NEGATIVE
KETONES UR: NEGATIVE mg/dL
Nitrite: NEGATIVE
Protein, ur: 30 mg/dL — AB
Specific Gravity, Urine: 1.008 (ref 1.005–1.030)
pH: 5 (ref 5.0–8.0)

## 2016-12-23 LAB — TYPE AND SCREEN
ABO/RH(D): A POS
Antibody Screen: NEGATIVE

## 2016-12-23 LAB — APTT: aPTT: 32 seconds (ref 24–36)

## 2016-12-23 LAB — SURGICAL PCR SCREEN
MRSA, PCR: NEGATIVE
STAPHYLOCOCCUS AUREUS: NEGATIVE

## 2016-12-23 LAB — PROTIME-INR
INR: 0.97
Prothrombin Time: 12.9 seconds (ref 11.4–15.2)

## 2016-12-23 NOTE — Pre-Procedure Instructions (Signed)
    [name] [name]  12/23/2016     Your procedure is scheduled on Tuesday, January 16.  Report to [facility] Admitting at 5:30  A.M.                For any other questions, please call [phone], Monday - Friday 8 AM - 4 PM.   Call this number if you have problems the morning of surgery: [phone]               For any other questions, please call [phone], Monday - Friday 8 AM - 4 PM.   Remember:  Do not eat food or drink liquids after midnight Monday, January 15  Take these medicines the morning of surgery with A SIP OF WATER: amLODipine (NORVASC), aspirin, atenolol (TENORMIN), clonazePAM (KLONOPIN), diltiazem (CARDIZEM CD), esomeprazole ([place]), levothyroxine (SYNTHROID, LEVOTHROID).                     STOP taking,Aspirin Products [employer], Excedrin Migraine), Ibuprofen (Advil), Naproxen (Aleve), Vitamins and Herbal Products (ie Fish Oil)   Do not wear jewelry, make-up or nail polish.  Do not wear lotions, powders, or perfumes, or deodorant.  Do not shave 48 hours prior to surgery.    Do not bring valuables to the hospital.  [name] is not responsible for any belongings or valuables.  Contacts, dentures or bridgework may not be worn into surgery.  Leave your suitcase in the car.  After surgery it may be brought to your room.  For patients admitted to the hospital, discharge time will be determined by your treatment team.  Special instructions:  Review  [facility] - Preparing For Surgery.  Please read over the following fact sheets that you were given: [facility]- Preparing For Surgery and Patient Instructions for Mupirocin Application,Coughing and Deep Breathing,Pain Booklet

## 2016-12-23 NOTE — Progress Notes (Addendum)
[name] [name] denies chest pain irregular heart. Patient reported that she was placed on Klonopin for seizures in Dec.  Patient is poor historian, but alert and oriented and is knowlegable about surgery schedule for 12/28/16.  Patient denies history of stroke or ARF (2015).  Creatinine today was 1.1 , was 0.82 11/17/16.   Chart to be reviewed by Shelby Dubin. PA for anesthesia.

## 2016-12-24 NOTE — Progress Notes (Signed)
I spoke with [name] [name] and asked if she has had an EKG at PCP, patient said she has not.  I told patient that we would like an EKG prior to the day of surgery to show that she is in a regular rhythm as noted in discharge notes.  "Oh yes, it was just irregular because they stopped my Xanax, it has been fine."  Patient said that she will come in Monday, January 15 prior to noon.

## 2016-12-24 NOTE — Progress Notes (Addendum)
Anesthesia Chart Review: Patient is a 81 year old female scheduled for left carotid endarterectomy on 12/28/16 by Dr. Deitra Mayo. In September, patient had a brain MRI by ENT Dr. Vicie Mutters for evaluation of hearing loss and tinnitus. MRI showed remote lacunar infarct within the central pons. She was referred to neurology. MRA 123456 showed severe LICA stenosis. She was referred to vascular surgery for surgical intervention.  PMH includes non-smoker, HTN, hyperlipidemia, thyroid disease, GERD, hiatal hernia, anxiety, depression, pancreatitis, post-op N/V, IBS, chronic diastolic CHF, paroxsymal atrial flutter 11/13/16, seizure (felt related to benzo/Xanax withdrawal) 10/2016, CVA (silent CVA; remote lacunar infarcts within the central pons MRI 08/24/16), PVD, anemia, acute renal failure 08/23/14, alopecia, back surgery '10, cholecystectomy, VHR '13, left TKA 03/10/16.  - Hospitalization 11/10/16-11/18/16 for altered mental status and seizure activity. She became febrile and underwent LP which was inconclusive for infection. Brain MRI did not show any acute abnormality. EEG was suggestive of encephalopathy. Ultimately seizures and encephalopathy were felt related to Xanax and ETOH withdrawal. Neurology and psychiatry consulted. She completed detox treatment for benzodiazepine and alcohol. Of note, on 11/13/16 she developed aflutter and given oral Cardizem (was already on b-blocker). She converted to NSR within a few hours. Echo showed diastolic dysfunction. Chads2vasc score was 5, but given history of ETOH abuse, anticoagulation therapy was not initiated and instead placed on ASA only. Hospitalist deferred long term decision to her PCP. They felt PAF was likely due to acute illness. Cardiology was not consulted.   PCP is listed as Dr. Birdie Sons. Neurologist is Dr. Leonie Man.  Medications include amlodipine, aspirin 81 mg, Excedrin, atenolol, clonazepam, Cardizem CD, Nexium, folic acid, Lasix,  Neurontin, hyoscyamine, levothyroxine, KCL, Seroquel, Florastor, Zocor, thiamine, Effexor XR, Ambien.   BP (!) 148/68   Pulse (!) 54   Temp 36.7 C   Resp 18   Ht 5\' 1"  (1.549 m)   Wt 148 lb 14.4 oz (67.5 kg)   SpO2 97%   BMI 28.13 kg/m   EKG 11/13/16: Aflutter with variable AV block. LVH with repolarization abnormality. Cannot rule out septal infarct (age undetermined). Aflutter is new when compared to 11/10/16 tracing. She denied CP and irregular heart rate at PAT.   Echo 11/14/16: Study Conclusions - Procedure narrative: Transthoracic echocardiography. Image   quality was adequate. The study was technically difficult. - Left ventricle: The cavity size was normal. There was moderate   concentric hypertrophy. Systolic function was normal. The   estimated ejection fraction was in the range of 60% to 65%. Wall   motion was normal; there were no regional wall motion   abnormalities. Features are consistent with a pseudonormal left   ventricular filling pattern, with concomitant abnormal relaxation   and increased filling pressure (grade 2 diastolic dysfunction). - Aortic valve: There was trivial regurgitation. - Mitral valve: Calcified annulus. Mildly thickened leaflets .   There was mild regurgitation. - Left atrium: The atrium was mildly dilated. - Pulmonary arteries: Systolic pressure was mildly increased. PA   peak pressure: 37 mm Hg (S).  1V CXR 11/12/16: IMPRESSION: Limited low volume chest without acute or focal finding.  Carotid U/S 12/15/16: Impression: 1. < 40% RICA stenosis. 2. 123456 LICA stenosis.  EEG 11/11/16: Impression: This is a mildly abnormal EEG due to mild intermixed slowing. This is suggestive of a mild encephalopathy but is nonspecific as to etiology. No evidence of seizure. Tremors are noted but have no electrical correlate.   MRA head/neck 10/19/16:  IMPRESSION (brain):  Unremarkable bowel  MRA of the brain show no significant stenosis of large and  medium size intracranial vessels. 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 Both vertebral arteries have antegrade flow.  MRI Brain 11/11/16: FINDINGS: Brain: Generalized atrophy. Negative for hydrocephalus. Negative for acute infarct. Scattered small white matter hyperintensities consistent with chronic microvascular ischemia. Mild chronic ischemia in the central pons. Negative for hemorrhage or mass. No shift of the midline structures. Vascular: Normal arterial flow voids. Skull and upper cervical spine: Negative Sinuses/Orbits: Bilateral lens replacement. Paranasal sinuses clear. Other: None IMPRESSION: Atrophy and mild chronic microvascular ischemic change. No acute abnormality.  Preoperative labs noted. Cr 1.10. H/H 11.6/36.6. PT/PTT WNL. Glucose 103. T&S done. UA showed small leukocytes, negative nitrites, rare bacteria, 6-30 WBC.  Discussed above with anesthesiologist Dr. Linna Caprice. Brief  paroxsymal aflutter was in the setting of acute illness/hospitalization for Xanax/ETOH withdrawal. She is on atenolol and diltiazem. LVEF and wall motion were normal on echo. Recommend repeat EKG prior to surgery (preferrable prior to the day of surgery if patient is able to come back in). If no acute CV symptoms and maintaining SR with out worrisome EKG changes then it is anticipated that she could proceed as planned. PAT staff will contact patient to see if she can come in for EKG.  George Hugh [facility] Stay [facility]/Anesthesiology Phone [phone] 12/24/2016 2:26 PM  Addendum: EKG 12/27/16 showed SB at 58 bpm, first degree AV block, LVH, cannot rule out septal infarct (age undetermined). Patient is maintaining SR. No significant T wave abnormalities, and possible  septal infarct changes have been present going back to at least 08/23/14. Borderline LAD is also an old finding. If no acute changes then I would anticipate that she can proceed as planned.  George Hugh [facility] Short Stay Center/Anesthesiology Phone [phone] 12/27/2016 11:49 AM

## 2016-12-27 DIAGNOSIS — I5032 Chronic diastolic (congestive) heart failure: Secondary | ICD-10-CM | POA: Diagnosis not present

## 2016-12-27 DIAGNOSIS — K589 Irritable bowel syndrome without diarrhea: Secondary | ICD-10-CM | POA: Diagnosis not present

## 2016-12-27 DIAGNOSIS — Z8249 Family history of ischemic heart disease and other diseases of the circulatory system: Secondary | ICD-10-CM | POA: Diagnosis not present

## 2016-12-27 DIAGNOSIS — E039 Hypothyroidism, unspecified: Secondary | ICD-10-CM | POA: Diagnosis not present

## 2016-12-27 DIAGNOSIS — I6522 Occlusion and stenosis of left carotid artery: Secondary | ICD-10-CM | POA: Diagnosis not present

## 2016-12-27 DIAGNOSIS — K219 Gastro-esophageal reflux disease without esophagitis: Secondary | ICD-10-CM | POA: Diagnosis not present

## 2016-12-27 DIAGNOSIS — K449 Diaphragmatic hernia without obstruction or gangrene: Secondary | ICD-10-CM | POA: Diagnosis not present

## 2016-12-27 DIAGNOSIS — I739 Peripheral vascular disease, unspecified: Secondary | ICD-10-CM | POA: Diagnosis not present

## 2016-12-27 DIAGNOSIS — F329 Major depressive disorder, single episode, unspecified: Secondary | ICD-10-CM | POA: Diagnosis not present

## 2016-12-27 DIAGNOSIS — I110 Hypertensive heart disease with heart failure: Secondary | ICD-10-CM | POA: Diagnosis not present

## 2016-12-27 DIAGNOSIS — E785 Hyperlipidemia, unspecified: Secondary | ICD-10-CM | POA: Diagnosis not present

## 2016-12-27 DIAGNOSIS — F419 Anxiety disorder, unspecified: Secondary | ICD-10-CM | POA: Diagnosis not present

## 2016-12-27 NOTE — Anesthesia Preprocedure Evaluation (Addendum)
Anesthesia Evaluation  Patient identified by MRN, date of birth, ID band Patient awake    Reviewed: Allergy & Precautions, H&P , NPO status , Patient's Chart, lab work & pertinent test results  History of Anesthesia Complications (+) PONV  Airway Mallampati: II  TM Distance: >3 FB Neck ROM: Full    Dental no notable dental hx. (+) Teeth Intact, Dental Advisory Given   Pulmonary neg pulmonary ROS,    Pulmonary exam normal breath sounds clear to auscultation       Cardiovascular hypertension, Pt. on medications and Pt. on home beta blockers + Peripheral Vascular Disease  + dysrhythmias Atrial Fibrillation  Rhythm:Regular Rate:Normal     Neuro/Psych Seizures -,  Anxiety Depression CVA negative psych ROS   GI/Hepatic Neg liver ROS, GERD  Medicated and Controlled,  Endo/Other  Hypothyroidism   Renal/GU negative Renal ROS  negative genitourinary   Musculoskeletal  (+) Arthritis , Osteoarthritis,    Abdominal   Peds  Hematology negative hematology ROS (+) anemia ,   Anesthesia Other Findings   Reproductive/Obstetrics negative OB ROS                           Anesthesia Physical Anesthesia Plan  ASA: III  Anesthesia Plan: General   Post-op Pain Management:    Induction: Intravenous  Airway Management Planned: Oral ETT  Additional Equipment: Arterial line  Intra-op Plan:   Post-operative Plan: Extubation in OR and Possible Post-op intubation/ventilation  Informed Consent: I have reviewed the patients History and Physical, chart, labs and discussed the procedure including the risks, benefits and alternatives for the proposed anesthesia with the patient or authorized representative who has indicated his/her understanding and acceptance.   Dental advisory given  Plan Discussed with: CRNA  Anesthesia Plan Comments:         Anesthesia Quick Evaluation

## 2016-12-28 ENCOUNTER — Encounter: Payer: Medicare Other | Admitting: Vascular Surgery

## 2016-12-28 ENCOUNTER — Encounter (HOSPITAL_COMMUNITY): Payer: Medicare Other

## 2016-12-28 ENCOUNTER — Inpatient Hospital Stay (HOSPITAL_COMMUNITY)
Admission: RE | Admit: 2016-12-28 | Discharge: 2016-12-29 | DRG: 038 | Disposition: A | Discharge status: Home / Self Care | Payer: Medicare Other | Source: Ambulatory Visit | Attending: Vascular Surgery | Admitting: Vascular Surgery

## 2016-12-28 ENCOUNTER — Inpatient Hospital Stay (HOSPITAL_COMMUNITY): Payer: Medicare Other | Admitting: Anesthesiology

## 2016-12-28 ENCOUNTER — Inpatient Hospital Stay (HOSPITAL_COMMUNITY): Payer: Medicare Other | Admitting: Vascular Surgery

## 2016-12-28 ENCOUNTER — Encounter (HOSPITAL_COMMUNITY)
Admission: RE | Disposition: A | Discharge status: Home / Self Care | Payer: Self-pay | Source: Ambulatory Visit | Attending: Vascular Surgery

## 2016-12-28 ENCOUNTER — Encounter (HOSPITAL_COMMUNITY): Payer: Self-pay | Admitting: *Deleted

## 2016-12-28 DIAGNOSIS — I739 Peripheral vascular disease, unspecified: Secondary | ICD-10-CM | POA: Diagnosis present

## 2016-12-28 DIAGNOSIS — E876 Hypokalemia: Secondary | ICD-10-CM | POA: Diagnosis not present

## 2016-12-28 DIAGNOSIS — K219 Gastro-esophageal reflux disease without esophagitis: Secondary | ICD-10-CM | POA: Diagnosis present

## 2016-12-28 DIAGNOSIS — F329 Major depressive disorder, single episode, unspecified: Secondary | ICD-10-CM | POA: Diagnosis present

## 2016-12-28 DIAGNOSIS — Z888 Allergy status to other drugs, medicaments and biological substances status: Secondary | ICD-10-CM

## 2016-12-28 DIAGNOSIS — Z7982 Long term (current) use of aspirin: Secondary | ICD-10-CM

## 2016-12-28 DIAGNOSIS — Z8249 Family history of ischemic heart disease and other diseases of the circulatory system: Secondary | ICD-10-CM | POA: Diagnosis not present

## 2016-12-28 DIAGNOSIS — F419 Anxiety disorder, unspecified: Secondary | ICD-10-CM | POA: Diagnosis present

## 2016-12-28 DIAGNOSIS — E785 Hyperlipidemia, unspecified: Secondary | ICD-10-CM | POA: Diagnosis present

## 2016-12-28 DIAGNOSIS — K589 Irritable bowel syndrome without diarrhea: Secondary | ICD-10-CM | POA: Diagnosis present

## 2016-12-28 DIAGNOSIS — K449 Diaphragmatic hernia without obstruction or gangrene: Secondary | ICD-10-CM | POA: Diagnosis present

## 2016-12-28 DIAGNOSIS — I6529 Occlusion and stenosis of unspecified carotid artery: Secondary | ICD-10-CM | POA: Diagnosis present

## 2016-12-28 DIAGNOSIS — I110 Hypertensive heart disease with heart failure: Secondary | ICD-10-CM | POA: Diagnosis present

## 2016-12-28 DIAGNOSIS — E039 Hypothyroidism, unspecified: Secondary | ICD-10-CM | POA: Diagnosis present

## 2016-12-28 DIAGNOSIS — I5032 Chronic diastolic (congestive) heart failure: Secondary | ICD-10-CM | POA: Diagnosis present

## 2016-12-28 DIAGNOSIS — Z9102 Food additives allergy status: Secondary | ICD-10-CM

## 2016-12-28 DIAGNOSIS — Z79899 Other long term (current) drug therapy: Secondary | ICD-10-CM | POA: Diagnosis not present

## 2016-12-28 DIAGNOSIS — I6522 Occlusion and stenosis of left carotid artery: Principal | ICD-10-CM | POA: Diagnosis present

## 2016-12-28 DIAGNOSIS — M1990 Unspecified osteoarthritis, unspecified site: Secondary | ICD-10-CM | POA: Diagnosis not present

## 2016-12-28 HISTORY — PX: ENDARTERECTOMY: SHX5162

## 2016-12-28 HISTORY — DX: Personal history of other diseases of the digestive system: Z87.19

## 2016-12-28 HISTORY — DX: Occlusion and stenosis of unspecified carotid artery: I65.29

## 2016-12-28 LAB — CREATININE, SERUM: CREATININE: 0.88 mg/dL (ref 0.44–1.00)

## 2016-12-28 LAB — CBC
HCT: 36.8 % (ref 36.0–46.0)
Hemoglobin: 11.7 g/dL — ABNORMAL LOW (ref 12.0–15.0)
MCH: 28.5 pg (ref 26.0–34.0)
MCHC: 31.8 g/dL (ref 30.0–36.0)
MCV: 89.8 fL (ref 78.0–100.0)
Platelets: 166 10*3/uL (ref 150–400)
RBC: 4.1 MIL/uL (ref 3.87–5.11)
RDW: 13.5 % (ref 11.5–15.5)
WBC: 9.4 10*3/uL (ref 4.0–10.5)

## 2016-12-28 SURGERY — ENDARTERECTOMY, CAROTID
Anesthesia: General | Site: Neck | Laterality: Left

## 2016-12-28 MED ORDER — HYOSCYAMINE SULFATE ER 0.375 MG PO TB12
0.3750 mg | ORAL_TABLET | Freq: Two times a day (BID) | ORAL | Status: DC | PRN
  Filled 2016-12-28: qty 1

## 2016-12-28 MED ORDER — SODIUM CHLORIDE 0.9 % IV SOLN: INTRAVENOUS | Status: DC

## 2016-12-28 MED ORDER — PHENYLEPHRINE HCL 10 MG/ML IJ SOLN
INTRAVENOUS | Status: DC | PRN
  Administered 2016-12-28: 25 ug/min via INTRAVENOUS

## 2016-12-28 MED ORDER — LABETALOL HCL 5 MG/ML IV SOLN
INTRAVENOUS | Status: AC
  Filled 2016-12-28: qty 4

## 2016-12-28 MED ORDER — DEXTRAN 40 IN SALINE 10-0.9 % IV SOLN
INTRAVENOUS | Status: DC | PRN
  Administered 2016-12-28: 500 mL

## 2016-12-28 MED ORDER — ACETAMINOPHEN 325 MG PO TABS
325.0000 mg | ORAL_TABLET | ORAL | Status: DC | PRN
  Administered 2016-12-28: 650 mg via ORAL
  Filled 2016-12-28: qty 2

## 2016-12-28 MED ORDER — SUGAMMADEX SODIUM 200 MG/2ML IV SOLN
INTRAVENOUS | Status: DC | PRN
  Administered 2016-12-28: 200 mg via INTRAVENOUS

## 2016-12-28 MED ORDER — FUROSEMIDE 40 MG PO TABS
20.0000 mg | ORAL_TABLET | Freq: Every day | ORAL | Status: DC
  Administered 2016-12-29: 20 mg via ORAL
  Filled 2016-12-28: qty 1

## 2016-12-28 MED ORDER — ACETAMINOPHEN 650 MG RE SUPP: 325.0000 mg | RECTAL | Status: DC | PRN

## 2016-12-28 MED ORDER — ATENOLOL 50 MG PO TABS
100.0000 mg | ORAL_TABLET | Freq: Every morning | ORAL | Status: DC
  Administered 2016-12-29: 100 mg via ORAL
  Filled 2016-12-28: qty 2

## 2016-12-28 MED ORDER — BISACODYL 5 MG PO TBEC: 5.0000 mg | DELAYED_RELEASE_TABLET | Freq: Every day | ORAL | Status: DC | PRN

## 2016-12-28 MED ORDER — HEPARIN SODIUM (PORCINE) 1000 UNIT/ML IJ SOLN
INTRAMUSCULAR | Status: DC | PRN
  Administered 2016-12-28: 6500 [IU] via INTRAVENOUS

## 2016-12-28 MED ORDER — VENLAFAXINE HCL ER 75 MG PO CP24
75.0000 mg | ORAL_CAPSULE | Freq: Every day | ORAL | Status: DC
  Administered 2016-12-28 – 2016-12-29 (×2): 75 mg via ORAL
  Filled 2016-12-28 (×2): qty 1

## 2016-12-28 MED ORDER — SIMVASTATIN 20 MG PO TABS
10.0000 mg | ORAL_TABLET | Freq: Every day | ORAL | Status: DC
  Administered 2016-12-28: 10 mg via ORAL
  Filled 2016-12-28: qty 1

## 2016-12-28 MED ORDER — POTASSIUM CHLORIDE CRYS ER 20 MEQ PO TBCR
40.0000 meq | EXTENDED_RELEASE_TABLET | Freq: Every day | ORAL | Status: DC
  Administered 2016-12-28 – 2016-12-29 (×2): 40 meq via ORAL
  Filled 2016-12-28 (×2): qty 2

## 2016-12-28 MED ORDER — SENNOSIDES-DOCUSATE SODIUM 8.6-50 MG PO TABS: 1.0000 | ORAL_TABLET | Freq: Every evening | ORAL | Status: DC | PRN

## 2016-12-28 MED ORDER — SODIUM CHLORIDE 0.9 % IV SOLN
INTRAVENOUS | Status: DC | PRN
  Administered 2016-12-28: 08:00:00

## 2016-12-28 MED ORDER — PHENOL 1.4 % MT LIQD: 1.0000 | OROMUCOSAL | Status: DC | PRN

## 2016-12-28 MED ORDER — GLYCOPYRROLATE 0.2 MG/ML IJ SOLN
INTRAMUSCULAR | Status: DC | PRN
  Administered 2016-12-28: .2 mg via INTRAVENOUS

## 2016-12-28 MED ORDER — PROTAMINE SULFATE 10 MG/ML IV SOLN
INTRAVENOUS | Status: DC | PRN
  Administered 2016-12-28: 40 mg via INTRAVENOUS

## 2016-12-28 MED ORDER — FOLIC ACID 1 MG PO TABS
1.0000 mg | ORAL_TABLET | Freq: Every day | ORAL | Status: DC
  Administered 2016-12-28 – 2016-12-29 (×2): 1 mg via ORAL
  Filled 2016-12-28 (×2): qty 1

## 2016-12-28 MED ORDER — DILTIAZEM HCL ER COATED BEADS 180 MG PO CP24
180.0000 mg | ORAL_CAPSULE | Freq: Every day | ORAL | Status: DC
  Administered 2016-12-29: 180 mg via ORAL
  Filled 2016-12-28: qty 1

## 2016-12-28 MED ORDER — LIDOCAINE HCL (CARDIAC) 20 MG/ML IV SOLN
INTRAVENOUS | Status: DC | PRN
  Administered 2016-12-28: 80 mg via INTRATRACHEAL

## 2016-12-28 MED ORDER — PROPOFOL 10 MG/ML IV BOLUS
INTRAVENOUS | Status: DC | PRN
  Administered 2016-12-28: 90 mg via INTRAVENOUS

## 2016-12-28 MED ORDER — ASPIRIN EC 81 MG PO TBEC
81.0000 mg | DELAYED_RELEASE_TABLET | Freq: Every day | ORAL | Status: DC
  Administered 2016-12-29: 81 mg via ORAL
  Filled 2016-12-28: qty 1

## 2016-12-28 MED ORDER — ALUM & MAG HYDROXIDE-SIMETH 200-200-20 MG/5ML PO SUSP: 15.0000 mL | ORAL | Status: DC | PRN

## 2016-12-28 MED ORDER — 0.9 % SODIUM CHLORIDE (POUR BTL) OPTIME
TOPICAL | Status: DC | PRN
  Administered 2016-12-28 (×3): 1000 mL

## 2016-12-28 MED ORDER — FENTANYL CITRATE (PF) 250 MCG/5ML IJ SOLN
INTRAMUSCULAR | Status: DC | PRN
  Administered 2016-12-28: 100 ug via INTRAVENOUS

## 2016-12-28 MED ORDER — LACTATED RINGERS IV SOLN
INTRAVENOUS | Status: DC | PRN
  Administered 2016-12-28 (×2): via INTRAVENOUS

## 2016-12-28 MED ORDER — LIDOCAINE-EPINEPHRINE (PF) 1 %-1:200000 IJ SOLN
INTRAMUSCULAR | Status: DC | PRN
  Administered 2016-12-28: 5 mL via INTRADERMAL

## 2016-12-28 MED ORDER — PANTOPRAZOLE SODIUM 40 MG PO TBEC
40.0000 mg | DELAYED_RELEASE_TABLET | Freq: Every day | ORAL | Status: DC
  Administered 2016-12-28 – 2016-12-29 (×2): 40 mg via ORAL
  Filled 2016-12-28 (×2): qty 1

## 2016-12-28 MED ORDER — ZOLPIDEM TARTRATE 5 MG PO TABS
5.0000 mg | ORAL_TABLET | Freq: Every evening | ORAL | Status: DC | PRN
  Administered 2016-12-28: 5 mg via ORAL
  Filled 2016-12-28: qty 1

## 2016-12-28 MED ORDER — LEVOTHYROXINE SODIUM 75 MCG PO TABS
75.0000 ug | ORAL_TABLET | Freq: Every day | ORAL | Status: DC
  Administered 2016-12-29: 75 ug via ORAL
  Filled 2016-12-28: qty 1

## 2016-12-28 MED ORDER — LIDOCAINE HCL (PF) 1 % IJ SOLN
INTRAMUSCULAR | Status: AC
  Filled 2016-12-28: qty 30

## 2016-12-28 MED ORDER — FENTANYL CITRATE (PF) 100 MCG/2ML IJ SOLN
INTRAMUSCULAR | Status: AC
  Filled 2016-12-28: qty 2

## 2016-12-28 MED ORDER — HEPARIN SODIUM (PORCINE) 1000 UNIT/ML IJ SOLN
INTRAMUSCULAR | Status: AC
  Filled 2016-12-28: qty 1

## 2016-12-28 MED ORDER — THROMBIN 20000 UNITS EX SOLR
CUTANEOUS | Status: AC
  Filled 2016-12-28: qty 20000

## 2016-12-28 MED ORDER — PROPOFOL 10 MG/ML IV BOLUS
INTRAVENOUS | Status: AC
  Filled 2016-12-28: qty 20

## 2016-12-28 MED ORDER — ONDANSETRON HCL 4 MG/2ML IJ SOLN
INTRAMUSCULAR | Status: DC | PRN
  Administered 2016-12-28: 4 mg via INTRAVENOUS

## 2016-12-28 MED ORDER — ROCURONIUM BROMIDE 100 MG/10ML IV SOLN
INTRAVENOUS | Status: DC | PRN
  Administered 2016-12-28: 50 mg via INTRAVENOUS

## 2016-12-28 MED ORDER — SODIUM CHLORIDE 0.9 % IV SOLN: 500.0000 mL | Freq: Once | INTRAVENOUS | Status: DC | PRN

## 2016-12-28 MED ORDER — FENTANYL CITRATE (PF) 250 MCG/5ML IJ SOLN
INTRAMUSCULAR | Status: AC
  Filled 2016-12-28: qty 5

## 2016-12-28 MED ORDER — DEXTROSE 5 % IV SOLN
1.5000 g | Freq: Two times a day (BID) | INTRAVENOUS | Status: AC
  Administered 2016-12-28 – 2016-12-29 (×2): 1.5 g via INTRAVENOUS
  Filled 2016-12-28 (×2): qty 1.5

## 2016-12-28 MED ORDER — SODIUM CHLORIDE 0.9 % IV SOLN
INTRAVENOUS | Status: DC
  Administered 2016-12-28: 23:00:00 via INTRAVENOUS
  Administered 2016-12-28: 100 mL/h via INTRAVENOUS

## 2016-12-28 MED ORDER — QUETIAPINE FUMARATE 25 MG PO TABS
25.0000 mg | ORAL_TABLET | Freq: Every day | ORAL | Status: DC
  Administered 2016-12-28: 25 mg via ORAL
  Filled 2016-12-28: qty 1

## 2016-12-28 MED ORDER — ENOXAPARIN SODIUM 40 MG/0.4ML ~~LOC~~ SOLN: 40.0000 mg | SUBCUTANEOUS | Status: DC

## 2016-12-28 MED ORDER — FENTANYL CITRATE (PF) 100 MCG/2ML IJ SOLN
25.0000 ug | INTRAMUSCULAR | Status: DC | PRN
  Administered 2016-12-28 (×4): 25 ug via INTRAVENOUS

## 2016-12-28 MED ORDER — CHLORHEXIDINE GLUCONATE CLOTH 2 % EX PADS: 6.0000 | MEDICATED_PAD | Freq: Once | CUTANEOUS | Status: DC

## 2016-12-28 MED ORDER — CLONAZEPAM 0.5 MG PO TABS
0.2500 mg | ORAL_TABLET | Freq: Two times a day (BID) | ORAL | Status: DC
  Administered 2016-12-28 – 2016-12-29 (×2): 0.25 mg via ORAL
  Filled 2016-12-28 (×2): qty 1

## 2016-12-28 MED ORDER — DEXTROSE 5 % IV SOLN
1.5000 g | INTRAVENOUS | Status: AC
  Administered 2016-12-28: 1.5 g via INTRAVENOUS
  Filled 2016-12-28: qty 1.5

## 2016-12-28 MED ORDER — LIDOCAINE-EPINEPHRINE (PF) 1 %-1:200000 IJ SOLN
INTRAMUSCULAR | Status: AC
  Filled 2016-12-28: qty 30

## 2016-12-28 MED ORDER — DOCUSATE SODIUM 100 MG PO CAPS
100.0000 mg | ORAL_CAPSULE | Freq: Every day | ORAL | Status: DC
  Administered 2016-12-29: 100 mg via ORAL
  Filled 2016-12-28: qty 1

## 2016-12-28 MED ORDER — MORPHINE SULFATE (PF) 2 MG/ML IV SOLN
2.0000 mg | INTRAVENOUS | Status: DC | PRN
  Administered 2016-12-28 (×3): 2 mg via INTRAVENOUS
  Administered 2016-12-29: 3 mg via INTRAVENOUS
  Filled 2016-12-28 (×2): qty 1
  Filled 2016-12-28: qty 2
  Filled 2016-12-28: qty 1

## 2016-12-28 MED ORDER — POTASSIUM CHLORIDE CRYS ER 20 MEQ PO TBCR: 20.0000 meq | EXTENDED_RELEASE_TABLET | Freq: Every day | ORAL | Status: DC | PRN

## 2016-12-28 MED ORDER — ADULT MULTIVITAMIN W/MINERALS CH: 1.0000 | ORAL_TABLET | Freq: Every day | ORAL | Status: DC | PRN

## 2016-12-28 MED ORDER — OXYCODONE-ACETAMINOPHEN 5-325 MG PO TABS
1.0000 | ORAL_TABLET | ORAL | Status: DC | PRN
  Administered 2016-12-28 – 2016-12-29 (×2): 2 via ORAL
  Filled 2016-12-28 (×2): qty 2

## 2016-12-28 MED ORDER — GABAPENTIN 400 MG PO CAPS
400.0000 mg | ORAL_CAPSULE | Freq: Three times a day (TID) | ORAL | Status: DC
  Administered 2016-12-28 – 2016-12-29 (×3): 400 mg via ORAL
  Filled 2016-12-28 (×3): qty 1

## 2016-12-28 MED ORDER — DEXTRAN 40 IN SALINE 10-0.9 % IV SOLN
INTRAVENOUS | Status: AC
  Filled 2016-12-28: qty 500

## 2016-12-28 MED ORDER — ONDANSETRON HCL 4 MG/2ML IJ SOLN: 4.0000 mg | Freq: Four times a day (QID) | INTRAMUSCULAR | Status: DC | PRN

## 2016-12-28 MED ORDER — AMLODIPINE BESYLATE 2.5 MG PO TABS
2.5000 mg | ORAL_TABLET | Freq: Every day | ORAL | Status: DC
  Administered 2016-12-29: 2.5 mg via ORAL
  Filled 2016-12-28: qty 1

## 2016-12-28 MED ORDER — HYDRALAZINE HCL 20 MG/ML IJ SOLN
5.0000 mg | INTRAMUSCULAR | Status: AC | PRN
  Administered 2016-12-28 (×2): 5 mg via INTRAVENOUS
  Filled 2016-12-28 (×2): qty 1

## 2016-12-28 MED ORDER — METOPROLOL TARTRATE 5 MG/5ML IV SOLN: 2.0000 mg | INTRAVENOUS | Status: DC | PRN

## 2016-12-28 MED ORDER — PROTAMINE SULFATE 10 MG/ML IV SOLN
INTRAVENOUS | Status: AC
  Filled 2016-12-28: qty 5

## 2016-12-28 MED ORDER — GUAIFENESIN-DM 100-10 MG/5ML PO SYRP: 15.0000 mL | ORAL_SOLUTION | ORAL | Status: DC | PRN

## 2016-12-28 MED ORDER — VITAMIN B-1 100 MG PO TABS
100.0000 mg | ORAL_TABLET | Freq: Every day | ORAL | Status: DC
  Administered 2016-12-28 – 2016-12-29 (×2): 100 mg via ORAL
  Filled 2016-12-28 (×2): qty 1

## 2016-12-28 MED ORDER — MAGNESIUM SULFATE 2 GM/50ML IV SOLN: 2.0000 g | Freq: Every day | INTRAVENOUS | Status: DC | PRN

## 2016-12-28 MED ORDER — LABETALOL HCL 5 MG/ML IV SOLN
10.0000 mg | INTRAVENOUS | Status: AC | PRN
  Administered 2016-12-28 (×4): 10 mg via INTRAVENOUS
  Filled 2016-12-28 (×3): qty 4

## 2016-12-28 SURGICAL SUPPLY — 57 items
ADH SKN CLS APL DERMABOND .7 (GAUZE/BANDAGES/DRESSINGS) ×1
BAG DECANTER FOR FLEXI CONT (MISCELLANEOUS) ×3 IMPLANT
CANISTER SUCTION 2500CC (MISCELLANEOUS) ×3 IMPLANT
CANNULA VESSEL 3MM 2 BLNT TIP (CANNULA) ×3 IMPLANT
CATH ROBINSON RED A/P 18FR (CATHETERS) ×3 IMPLANT
CLIP TI MEDIUM 24 (CLIP) ×3 IMPLANT
CLIP TI WIDE RED SMALL 24 (CLIP) ×3 IMPLANT
CLIP TI WIDE RED SMALL 6 (CLIP) ×2 IMPLANT
CRADLE DONUT ADULT HEAD (MISCELLANEOUS) ×3 IMPLANT
DERMABOND ADVANCED (GAUZE/BANDAGES/DRESSINGS) ×2
DERMABOND ADVANCED .7 DNX12 (GAUZE/BANDAGES/DRESSINGS) ×1 IMPLANT
DRAIN CHANNEL 15F RND FF W/TCR (WOUND CARE) IMPLANT
ELECT REM PT RETURN 9FT ADLT (ELECTROSURGICAL) ×3
ELECTRODE REM PT RTRN 9FT ADLT (ELECTROSURGICAL) ×1 IMPLANT
EVACUATOR SILICONE 100CC (DRAIN) IMPLANT
GLOVE BIO SURGEON STRL SZ7 (GLOVE) ×2 IMPLANT
GLOVE BIO SURGEON STRL SZ7.5 (GLOVE) ×5 IMPLANT
GLOVE BIOGEL PI IND STRL 6.5 (GLOVE) IMPLANT
GLOVE BIOGEL PI IND STRL 7.0 (GLOVE) IMPLANT
GLOVE BIOGEL PI IND STRL 7.5 (GLOVE) IMPLANT
GLOVE BIOGEL PI IND STRL 8 (GLOVE) ×1 IMPLANT
GLOVE BIOGEL PI INDICATOR 6.5 (GLOVE) ×2
GLOVE BIOGEL PI INDICATOR 7.0 (GLOVE) ×2
GLOVE BIOGEL PI INDICATOR 7.5 (GLOVE) ×4
GLOVE BIOGEL PI INDICATOR 8 (GLOVE) ×4
GLOVE ECLIPSE 6.5 STRL STRAW (GLOVE) ×8 IMPLANT
GOWN STRL REUS W/ TWL LRG LVL3 (GOWN DISPOSABLE) ×3 IMPLANT
GOWN STRL REUS W/ TWL XL LVL3 (GOWN DISPOSABLE) IMPLANT
GOWN STRL REUS W/TWL LRG LVL3 (GOWN DISPOSABLE) ×21
GOWN STRL REUS W/TWL XL LVL3 (GOWN DISPOSABLE) ×3
KIT BASIN OR (CUSTOM PROCEDURE TRAY) ×3 IMPLANT
KIT ROOM TURNOVER OR (KITS) ×3 IMPLANT
NDL 18GX1X1/2 (RX/OR ONLY) (NEEDLE) IMPLANT
NDL HYPO 25GX1X1/2 BEV (NEEDLE) IMPLANT
NDL HYPO 25X1 1.5 SAFETY (NEEDLE) ×1 IMPLANT
NEEDLE 18GX1X1/2 (RX/OR ONLY) (NEEDLE) ×3 IMPLANT
NEEDLE HYPO 25GX1X1/2 BEV (NEEDLE) ×3 IMPLANT
NEEDLE HYPO 25X1 1.5 SAFETY (NEEDLE) ×3 IMPLANT
NS IRRIG 1000ML POUR BTL (IV SOLUTION) ×6 IMPLANT
PACK CAROTID (CUSTOM PROCEDURE TRAY) ×3 IMPLANT
PAD ARMBOARD 7.5X6 YLW CONV (MISCELLANEOUS) ×6 IMPLANT
PATCH VASC XENOSURE 1CMX6CM (Vascular Products) ×3 IMPLANT
PATCH VASC XENOSURE 1X6 (Vascular Products) IMPLANT
SHUNT CAROTID BYPASS 10 (VASCULAR PRODUCTS) IMPLANT
SHUNT CAROTID BYPASS 12 (VASCULAR PRODUCTS) ×2 IMPLANT
SHUNT CAROTID BYPASS 12FRX15.5 (VASCULAR PRODUCTS) ×2 IMPLANT
SPONGE INTESTINAL PEANUT (DISPOSABLE) ×3 IMPLANT
SPONGE SURGIFOAM ABS GEL 100 (HEMOSTASIS) IMPLANT
SUT PROLENE 6 0 BV (SUTURE) ×3 IMPLANT
SUT PROLENE 7 0 BV 1 (SUTURE) IMPLANT
SUT SILK 2 0 FS (SUTURE) IMPLANT
SUT VIC AB 3-0 SH 27 (SUTURE) ×3
SUT VIC AB 3-0 SH 27X BRD (SUTURE) ×1 IMPLANT
SUT VICRYL 4-0 PS2 18IN ABS (SUTURE) ×3 IMPLANT
SYR 20CC LL (SYRINGE) ×4 IMPLANT
SYR CONTROL 10ML LL (SYRINGE) ×5 IMPLANT
WATER STERILE IRR 1000ML POUR (IV SOLUTION) ×3 IMPLANT

## 2016-12-28 NOTE — Progress Notes (Signed)
Patient received from PACU with a left radial A-line in place. BP's between A-line and cuff are 60-70 off. Whip is present. Maureen, [place] notified and stated it was ok to d/c A-line at this time. Line removed, pressure dressing applied, pt tolerated without any issues.  Joellen Jersey, RN.

## 2016-12-28 NOTE — Progress Notes (Addendum)
   Left neck incision clean and dry without hematoma No tongue deviation, and smile is symmetric  S/P Left CEA Stable disposition  COLLINS, EMMA MAUREEN PA-C  I have interviewed the patient and examined the patient. I agree with the findings by the PA.  Gae Gallop, MD [phone]

## 2016-12-28 NOTE — Transfer of Care (Signed)
Immediate Anesthesia Transfer of Care Note  Patient: [name] [name]  Procedure(s) Performed: Procedure(s): ENDARTERECTOMY CAROTID LEFT WITH XENOSURE BIOLOGIC PATCH ANGIOPLASTY (Left)  Patient Location: PACU  Anesthesia Type:General  Level of Consciousness: awake, alert  and oriented  Airway & Oxygen Therapy: Patient Spontanous Breathing and Patient connected to nasal cannula oxygen  Post-op Assessment: Report given to RN, Post -op Vital signs reviewed and stable and Patient moving all extremities X 4  Post vital signs: Reviewed and stable  Last Vitals:  Vitals:   12/28/16 0637  BP: (!) 153/48  Pulse: (!) 55  Resp: 18  Temp: 36.7 C    Last Pain:  Vitals:   12/28/16 0637  TempSrc: Oral         Complications: No apparent anesthesia complications

## 2016-12-28 NOTE — Anesthesia Postprocedure Evaluation (Signed)
Anesthesia Post Note  Patient: [name] [name]  Procedure(s) Performed: Procedure(s) (LRB): ENDARTERECTOMY CAROTID LEFT WITH XENOSURE BIOLOGIC PATCH ANGIOPLASTY (Left)  Patient location during evaluation: PACU Anesthesia Type: General Level of consciousness: awake and alert Pain management: pain level controlled Vital Signs Assessment: post-procedure vital signs reviewed and stable Respiratory status: spontaneous breathing, nonlabored ventilation, respiratory function stable and patient connected to nasal cannula oxygen Cardiovascular status: blood pressure returned to baseline and stable Postop Assessment: no signs of nausea or vomiting Anesthetic complications: no       Last Vitals:  Vitals:   12/28/16 1140 12/28/16 1145  BP: (!) 138/57   Pulse: 65 66  Resp: 14 (!) 21  Temp:      Last Pain:  Vitals:   12/28/16 1045  TempSrc:   PainSc: Asleep                 Sneijder Bernards,W. EDMOND

## 2016-12-28 NOTE — Anesthesia Procedure Notes (Signed)
Procedure Name: Intubation Date/Time: 12/28/2016 7:45 AM Performed by: Mariea Clonts Pre-anesthesia Checklist: Patient identified, Emergency Drugs available, Suction available and Patient being monitored Patient Re-evaluated:Patient Re-evaluated prior to inductionOxygen Delivery Method: Circle System Utilized Preoxygenation: Pre-oxygenation with 100% oxygen Intubation Type: IV induction Ventilation: Mask ventilation without difficulty Laryngoscope Size: Miller and 2 Grade View: Grade II Tube type: Oral Tube size: 7.0 mm Number of attempts: 1 Airway Equipment and Method: Stylet and Oral airway Placement Confirmation: ETT inserted through vocal cords under direct vision,  positive ETCO2 and breath sounds checked- equal and bilateral Tube secured with: Tape Dental Injury: Teeth and Oropharynx as per pre-operative assessment

## 2016-12-28 NOTE — Op Note (Signed)
    NAME: [name] [name]    MRN: [id number] DOB: Sep 24, 1936    DATE OF OPERATION: 12/28/2016  PREOP DIAGNOSIS: Greater than 80% left carotid stenosis  POSTOP DIAGNOSIS: Same  PROCEDURE: Left carotid endarterectomy with bovine pericardial patch angioplasty  SURGEON: Judeth Cornfield. Scot Dock, MD, FACS  ASSIST: Adele Barthel, MD, Gerri Lins PA  ANESTHESIA: Gen.   EBL: Minimal  INDICATIONS: [name] [name] is a 81 y.o. female who was admitted in November with an altered mental status and seizure activity. Workup included an MRI which showed a 90% left carotid stenosis. This was confirmed by duplex scan. Left carotid endarterectomy was recommended to lower her risk of future stroke.  FINDINGS: Focal 90% left carotid stenosis  TECHNIQUE: The patient was taken to the operating room and received a general anesthetic. Arterial line had been placed by anesthesia. The left neck was prepped and draped in usual sterile fashion. An incision was made along the anterior border of the sternocleidomastoid and the dissection carried down to the common carotid artery which was dissected free and controlled with a Rummel tourniquet. The facial vein was divided between 2-0 silk ties. The internal carotid artery was controlled above the plaque area and the superior thyroid artery and external carotid arteries were controlled. The patient was heparinized. Clamps were then placed on the internal then the common and then the external carotid artery. A longitudinal arteriotomy was made in the common carotid artery and this is extended through the plaque into the internal carotid artery above the plaque. A 12 shunt was placed into the internal carotid artery, backbled and then placed into the common carotid artery and secured with Rummel tourniquet. Flow was reestablished shunt. An endarterectomy plane was established proximally and the plaque was sharply divided. Eversion endarterectomy was performed of the external carotid  artery. Distally there was a nice taper in the plaque and no tacking sutures were required. The artery was irrigated with Some months of heparin and dextran and all loose debris removed. A bovine pericardial patch was sewn using continuous 6-0 Prolene suture. Prior to completing the patch closure, the shunt was removed. The arteries were back bled and flushed appropriately and the anastomosis completed. Flow was reestablished first to the external carotid artery and into the internal carotid artery. At the completion was a good pulse distal to the patch and a good Doppler signal. The heparin was partially reversed with protamine. Hemostasis was obtained in the wound. The wound was closed with deeper 3-0 Vicryl, the platysma was closed with running 3-0 Vicryl, and the skin was closed with a 4-0 subcuticular stitch. Liquid band was applied. The patient tolerated the procedure well and was transferred to the recovery room in stable condition. All needle and sponge counts were correct.  Deitra Mayo, MD, FACS [facility]  DATE OF DICTATION:   12/28/2016

## 2016-12-28 NOTE — Care Management Note (Signed)
Case Management Note  Patient Details  Name: [name] [name] MRN: [id number] Date of Birth: 23-Aug-1936  Subjective/Objective:   S/p CEA, NCM will cont to follow for dc needs.                 Action/Plan:   Expected Discharge Date:                  Expected Discharge Plan:  [place]  In-House Referral:     Discharge planning Services  CM Consult  Post Acute Care Choice:    Choice offered to:     DME Arranged:    DME Agency:     HH Arranged:    [place] Agency:     Status of Service:  In process, will continue to follow  If discussed at Long Length of Stay Meetings, dates discussed:    Additional Comments:  Zenon Mayo, RN 12/28/2016, 3:33 PM

## 2016-12-28 NOTE — H&P (View-Only) (Signed)
Patient name: [name] [name] MRN: [id number] DOB: January 06, 1936 Sex: female  REASON FOR CONSULT: Left carotid stenosis. Referred by Dr. Leonie [name].  HPI: [name] [name] is a 81 y.o. female, who was admitted on 11/10/2016 with altered mental status and seizure activity. MRI of the brain on 11/11/2016 showed atrophy and some mild chronic microvascular ischemic changes. There was no acute abnormality noted. MR angiogram showed a 90% left carotid stenosis. The patient sent for carotid evaluation.  The patient is right-handed. She denies any history of stroke, TIAs, expressive or receptive aphasia, or amaurosis fugax. She is on aspirin and is on a statin. She is not a smoker.  She has complained of some frontal headaches for several months.  She transiently had some chest pain when she was coming off of Xanax but this has resolved.  Past Medical History:  Diagnosis Date  . Anemia    hx of years ago   . Anxiety   . ARF (acute renal failure) ([place]) 08/23/2014  . Arthritis    psoriatic arthritis  . Back pain   . Blood transfusion    at age of 4  . Chronic diastolic CHF (congestive heart failure) ([place])   . Depression   . GERD (gastroesophageal reflux disease)   . H/O hiatal hernia   . Hyperlipidemia   . Hypertension   . Hypothyroidism   . IBS (irritable bowel syndrome)   . Pancreatitis   . PONV (postoperative nausea and vomiting)   . Thyroid disease     Family History  Problem Relation Age of Onset  . Heart disease Mother   . Cancer Father     lung    SOCIAL HISTORY: Social History   Social History  . Marital status: Married    Spouse name: N/A  . Number of children: N/A  . Years of education: N/A   Occupational History  . Not on file.   Social History Main Topics  . Smoking status: Never Smoker  . Smokeless tobacco: Never Used  . Alcohol use 0.6 oz/week    1 Glasses of wine per week     Comment: rare  . Drug use:     Types: Benzodiazepines  . Sexual activity: Not  Currently   Other Topics Concern  . Not on file   Social History Narrative  . No narrative on file    Allergies  Allergen Reactions  . Tetanus Toxoids Swelling  . Yellow Dyes (Non-Tartrazine) Itching and Other (See Comments)    Makes patient nervous     Current Outpatient Prescriptions  Medication Sig Dispense Refill  . acetaminophen (TYLENOL 8 HOUR ARTHRITIS PAIN) 650 MG CR tablet Take 650 mg by mouth every 8 (eight) hours as needed for pain.    [name] amLODipine (NORVASC) 2.5 MG tablet Take 1 tablet (2.5 mg total) by mouth daily. 30 tablet 0  . aspirin 325 MG tablet Take 1 tablet (325 mg total) by mouth daily. 30 tablet 0  . atenolol (TENORMIN) 100 MG tablet Take 1 tablet (100 mg total) by mouth every morning. 30 tablet 0  . clonazePAM (KLONOPIN) 0.5 MG tablet Take 1 tablet (0.5 mg total) by mouth 2 (two) times daily. 30 tablet 0  . diltiazem (CARDIZEM CD) 180 MG 24 hr capsule Take 1 capsule (180 mg total) by mouth daily. 30 capsule 0  . esomeprazole (NEXIUM) 40 MG capsule Take 40 mg by mouth daily.    . folic acid (FOLVITE) 1 MG tablet Take 1 tablet (  1 mg total) by mouth daily. 30 tablet 0  . furosemide (LASIX) 20 MG tablet Take 20 mg by mouth daily with breakfast.     . gabapentin (NEURONTIN) 400 MG capsule Take 400 mg by mouth 6 (six) times daily. As needed for nerve pain    . levothyroxine (SYNTHROID, LEVOTHROID) 75 MCG tablet Take 75 mcg by mouth daily before breakfast.     . Multiple Vitamin (MULTIVITAMIN WITH MINERALS) TABS tablet Take 1 tablet by mouth daily as needed (for supplementation).     . potassium chloride SA (K-DUR,KLOR-CON) 20 MEQ tablet Take 40 mEq by mouth daily.     . QUEtiapine (SEROQUEL) 25 MG tablet Take 1 tablet (25 mg total) by mouth at bedtime. 30 tablet 0  . saccharomyces boulardii (FLORASTOR) 250 MG capsule Take 1 capsule (250 mg total) by mouth 2 (two) times daily. 60 capsule 0  . simvastatin (ZOCOR) 10 MG tablet Take 10 mg by mouth at bedtime.    .  thiamine 100 MG tablet Take 1 tablet (100 mg total) by mouth daily. 30 tablet 0  . venlafaxine XR (EFFEXOR-XR) 75 MG 24 hr capsule Take 75 mg by mouth daily with breakfast.     . zolpidem (AMBIEN) 5 MG tablet Take 5 mg by mouth at bedtime as needed for sleep.    . feeding supplement, ENSURE ENLIVE, (ENSURE ENLIVE) LIQD Take 237 mLs by mouth 2 (two) times daily between meals. (Patient not taking: Reported on 12/15/2016) 237 mL 12  . hyoscyamine (LEVBID) 0.375 MG 12 hr tablet Take 0.375 mg by mouth every 12 (twelve) hours as needed for cramping.      No current facility-administered medications for this visit.     REVIEW OF SYSTEMS:  [X]  denotes positive finding, [ ]  denotes negative finding Cardiac  Comments:  Chest pain or chest pressure: X   Shortness of breath upon exertion:    Short of breath when lying flat:    Irregular heart rhythm:        Vascular    Pain in calf, thigh, or hip brought on by ambulation:    Pain in feet at night that wakes you up from your sleep:     Blood clot in your veins:    Leg swelling:         Pulmonary    Oxygen at home:    Productive cough:     Wheezing:         Neurologic    Sudden weakness in arms or legs:     Sudden numbness in arms or legs:     Sudden onset of difficulty speaking or slurred speech:    Temporary loss of vision in one eye:     Problems with dizziness:         Gastrointestinal    Blood in stool:     Vomited blood:         Genitourinary    Burning when urinating:     Blood in urine:        Psychiatric    Major depression:  X       Hematologic    Bleeding problems:    Problems with blood clotting too easily:        Skin    Rashes or ulcers:        Constitutional    Fever or chills:      PHYSICAL EXAM: Vitals:   12/15/16 1219 12/15/16 1222  BP: 137/68 (!) 148/70  Pulse: [name])  55   Resp: 18   Temp: 97.9 F (36.6 C)   TempSrc: Oral   SpO2: 96%   Weight: 147 lb 12.8 oz (67 kg)   Height: 5\' 1"  (1.549 m)      GENERAL: The patient is a well-nourished female, in no acute distress. The vital signs are documented above. CARDIAC: There is a regular rate and rhythm.  VASCULAR: She has a left carotid bruit. She has mild bilateral lower extremity swelling. PULMONARY: There is good air exchange bilaterally without wheezing or rales. ABDOMEN: Soft and non-tender with normal pitched bowel sounds.  MUSCULOSKELETAL: There are no major deformities or cyanosis. NEUROLOGIC: No focal weakness or paresthesias are detected. SKIN: There are no ulcers or rashes noted. PSYCHIATRIC: The patient has a normal affect.  DATA:   CAROTID DUPLEX: I have independently interpreted her carotid duplex scan today.   On the left side, there is a greater than 80% left carotid stenosis. The ICA appears normal beyond the stenosis. The bifurcation is noted to be somewhat high.  There is a less than 40% carotid stenosis on the right. Both vertebral arteries are patent with antegrade flow.  MR ANGIOGRAM NECK: I have reviewed the MR angiogram of the neck that was done on 10/19/2016. This was a workup for silent infarcts. This shows a high-grade proximal left internal carotid artery stenosis of 90%.  MEDICAL ISSUES:  ASYMPTOMATIC 90% LEFT CAROTID STENOSIS: Given the severity of stenosis and risk of stroke I have recommended left carotid endarterectomy. I have reviewed the indications for carotid endarterectomy, that is to lower the risk of future stroke. I have also reviewed the potential complications of surgery, including but not limited to: bleeding, stroke (perioperative risk 1-2%), MI, nerve injury of other unpredictable medical problems. All of the patients questions were answered and they are agreeable to proceed with surgery. She knows to continue her aspirin perioperatively. Her surgery is scheduled for 12/28/2016.  Deitra Mayo [facility] [place] [phone]

## 2016-12-28 NOTE — Interval H&P Note (Signed)
History and Physical Interval Note:  12/28/2016 7:23 AM  [name] [name]  has presented today for surgery, with the diagnosis of Left carotid artery stenosis I65.22  The various methods of treatment have been discussed with the patient and family. After consideration of risks, benefits and other options for treatment, the patient has consented to  Procedure(s): ENDARTERECTOMY CAROTID (Left) as a surgical intervention .  The patient's history has been reviewed, patient examined, no change in status, stable for surgery.  I have reviewed the patient's chart and labs.  Questions were answered to the patient's satisfaction.     Deitra Mayo

## 2016-12-29 ENCOUNTER — Encounter (HOSPITAL_COMMUNITY): Payer: Self-pay | Admitting: Vascular Surgery

## 2016-12-29 LAB — CBC
HEMATOCRIT: 33.4 % — AB (ref 36.0–46.0)
HEMOGLOBIN: 10.6 g/dL — AB (ref 12.0–15.0)
MCH: 29 pg (ref 26.0–34.0)
MCHC: 31.7 g/dL (ref 30.0–36.0)
MCV: 91.3 fL (ref 78.0–100.0)
Platelets: 166 10*3/uL (ref 150–400)
RBC: 3.66 MIL/uL — ABNORMAL LOW (ref 3.87–5.11)
RDW: 13.8 % (ref 11.5–15.5)
WBC: 7.5 10*3/uL (ref 4.0–10.5)

## 2016-12-29 LAB — BASIC METABOLIC PANEL
Anion gap: 8 (ref 5–15)
BUN: 10 mg/dL (ref 6–20)
CALCIUM: 8.5 mg/dL — AB (ref 8.9–10.3)
CHLORIDE: 105 mmol/L (ref 101–111)
CO2: 24 mmol/L (ref 22–32)
CREATININE: 0.86 mg/dL (ref 0.44–1.00)
GFR calc non Af Amer: >60 mL/min (ref >60)
GLUCOSE: 111 mg/dL — AB (ref 65–99)
Potassium: 4.2 mmol/L (ref 3.5–5.1)
Sodium: 137 mmol/L (ref 135–145)

## 2016-12-29 MED ORDER — OXYCODONE-ACETAMINOPHEN 5-325 MG PO TABS: 1.0000 | ORAL_TABLET | ORAL | 0 refills | Status: DC | PRN

## 2016-12-29 NOTE — Progress Notes (Signed)
Explained and discussed discharge instructions, f/u appt. Prescriptions given to patient. Pt going home with husband via w/c with belongings.

## 2016-12-29 NOTE — Care Management Note (Signed)
Case Management Note  Patient Details  Name: [name] [name] MRN: [id number] Date of Birth: [birth year]  Subjective/Objective:   S/p CEA,, for dc to home today, no needs.                 Action/Plan:   Expected Discharge Date:  12/29/16               Expected Discharge Plan:  Home/Self Care  In-House Referral:     Discharge planning Services  CM Consult  Post Acute Care Choice:    Choice offered to:     DME Arranged:    DME Agency:     HH Arranged:    HH Agency:     Status of Service:  Completed, signed off  If discussed at [employer] of Stay Meetings, dates discussed:    Additional Comments:  Zenon Mayo, RN 12/29/2016, 10:13 AM

## 2016-12-29 NOTE — Progress Notes (Signed)
   VASCULAR SURGERY ASSESSMENT & PLAN:  1 Day Post-Op s/p: Left CEA.  Doing well.  Home today on ASA and Zocor.   SUBJECTIVE: Some incisional discomfort.   PHYSICAL EXAM: Vitals:   12/29/16 0000 12/29/16 0100 12/29/16 0319 12/29/16 0400  BP:   (!) 173/72   Pulse:   72   Resp:   14   Temp:   99.1 F (37.3 C)   TempSrc:   Oral   SpO2: 90% 98% 94%   Weight:      Height:    5\' 1"  (1.549 m)   Neuro intact. Left neck incision looks fine.   LABS: Lab Results  Component Value Date   WBC 9.4 12/28/2016   HGB 11.7 (L) 12/28/2016   HCT 36.8 12/28/2016   MCV 89.8 12/28/2016   PLT 166 12/28/2016   Lab Results  Component Value Date   CREATININE 0.88 12/28/2016   Lab Results  Component Value Date   INR 0.97 12/23/2016   Active Problems:   Carotid stenosis  Gae Gallop Beeper: [id number] 12/29/2016

## 2016-12-30 ENCOUNTER — Telehealth: Payer: Self-pay | Admitting: Vascular Surgery

## 2016-12-30 NOTE — Telephone Encounter (Signed)
-----   Message from Mena Goes, RN sent at 12/29/2016  1:46 PM EST ----- Regarding: 2 weeks post op CEA   ----- Message ----- From: Alvia Grove, PA-C Sent: 12/29/2016   7:45 AM To: [facility] Charge Pool  S/p left CEA 12/28/16  F/u with Dr. Scot Dock in 2 weeks  Thanks Maudie Mercury

## 2016-12-30 NOTE — Telephone Encounter (Signed)
spoke to pt for appt on 2/7 will mail letter as well

## 2016-12-31 NOTE — Discharge Summary (Signed)
[facility] Discharge Summary  [name] [name] June 07, 1936 81 y.o. female  [id number]  Admission Date: 12/28/2016  Discharge Date: 12/29/2016  Physician: Deitra Mayo, MD  Admission Diagnosis: Left carotid artery stenosis I65.22  HPI:   This is a 81 y.o. female who was admitted on 11/10/2016 with altered mental status and seizure activity. MRI of the brain on 11/11/2016 showed atrophy and some mild chronic microvascular ischemic changes. There was no acute abnormality noted. MR angiogram showed a 90% left carotid stenosis. The patient sent for carotid evaluation.  The patient is right-handed. She denies any history of stroke, TIAs, expressive or receptive aphasia, or amaurosis fugax. She is on aspirin and is on a statin. She is not a smoker.  She has complained of some frontal headaches for several months.  She transiently had some chest pain when she was coming off of Xanax but this has resolved.  Hospital Course:  The patient was admitted to the hospital and taken to the operating room on 12/28/2016 and underwent left carotid endarterectomy.  The patient tolerated the procedure well and was transported to the PACU in stable condition.  By POD 1, the patient's neuro status was intact. Her left neck incision was without hematoma. She was tolerating a diet, ambulating without difficulty and pain well controlled. She was discharged home on POD 1 in good condition.     Recent Labs  12/29/16 0733  NA 137  K 4.2  CL 105  CO2 24  GLUCOSE 111*  BUN 10  CALCIUM 8.5*    Recent Labs  12/28/16 1655 12/29/16 0733  WBC 9.4 7.5  HGB 11.7* 10.6*  HCT 36.8 33.4*  PLT 166 166   No results for input(s): INR in the last 72 hours.  Discharge Instructions:   The patient is discharged to home with extensive instructions on wound care and progressive ambulation.  They are instructed not to drive or perform any heavy lifting until returning to see the  physician in his office.  Discharge Instructions    Call MD for:  redness, tenderness, or signs of infection (pain, swelling, bleeding, redness, odor or green/yellow discharge around incision site)    Complete by:  As directed    Call MD for:  severe or increased pain, loss or decreased feeling  in affected limb(s)    Complete by:  As directed    Call MD for:  temperature >100.5    Complete by:  As directed    Discharge instructions    Complete by:  As directed    You may shower in 24 hours   Driving Restrictions    Complete by:  As directed    No driving for 1 week   Increase activity slowly    Complete by:  As directed    Walk with assistance use walker or cane as needed   Lifting restrictions    Complete by:  As directed    No heavy  lifting for 4 weeks   Resume previous diet    Complete by:  As directed       Discharge Diagnosis:  Left carotid artery stenosis I65.22  Secondary Diagnosis: Patient Active Problem List   Diagnosis Date Noted  . Carotid stenosis 12/28/2016  . 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 Paroxysmal atrial fibrillation (HCC)   . Cerebrovascular disease   . Alcohol withdrawal delirium ([place])   . Fever   . Acute encephalopathy 11/10/2016  . Hypothyroidism 11/10/2016  .  Hypertensive urgency 11/10/2016  . Altered mental state 11/10/2016  . Seizure ([place]) 11/10/2016  . Chronic diastolic CHF (congestive heart failure) ([place])   . Left carotid stenosis 10/29/2016  . S/P total knee replacement 03/10/2016  . 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 Ileus ([place]) 09/05/2014  . Leukocytosis, unspecified 09/05/2014  . Vomiting 09/04/2014  . Pulmonary edema 09/04/2014  . Hypokalemia 08/28/2014  . Enteritis due to Clostridium difficile 08/27/2014  . 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 Protein-calorie malnutrition, severe ([place]) 08/24/2014  . Obstructive jaundice 08/23/2014  . Acute cholangitis 08/23/2014  . ARF  (acute renal failure) ([place]) 08/23/2014  . Coagulopathy ([place]) 08/23/2014  . Back pain   . Arthritis   . Hypertension   . GERD (gastroesophageal reflux disease)   . Anxiety   . IBS (irritable bowel syndrome)   . Hernia of Right posterior flank 04/18/2012   Past Medical History:  Diagnosis Date  . Alopecia   . Anemia    hx of years ago   . Anxiety   . ARF (acute renal failure) ([place]) 08/23/2014  . Arthritis    psoriatic arthritis  . Back pain   . Blood transfusion    at age of 4  . Carotid stenosis 12/2016  . Chronic diastolic CHF (congestive heart failure) ([place])   . Depression   . Dysrhythmia 11/13/2016   PAFib for a short time- Echo done 11/14/16 PAF- to follow up with PCP- to see if she is a candiate for anticoags.  Not started at times time due to alcohol abuse.  [name] GERD (gastroesophageal reflux disease)   . H/O hiatal hernia   . History of acute cholangitis   . Hyperlipidemia   . Hypertension   . Hypothyroidism   . IBS (irritable bowel syndrome)   . Pancreatitis   . Peripheral vascular disease ([place])   . PONV (postoperative nausea and vomiting)    with Breast reduction- only time  . Rosacea   . Seizures ([place]) 11/13/2016   Thopught to be from withdrawl Benzodiazepine  . Stroke [facility])    patient denies-  . Thyroid disease     Allergies as of 12/29/2016      Reactions   Tetanus Toxoids Swelling   Yellow Dyes (non-tartrazine) Itching, Other (See Comments)   Makes patient nervous       Medication List    TAKE these medications   amLODipine 2.5 MG tablet Commonly known as:  NORVASC Take 1 tablet (2.5 mg total) by mouth daily.   aspirin EC 81 MG tablet Take 81 mg by mouth daily.   atenolol 100 MG tablet Commonly known as:  TENORMIN Take 1 tablet (100 mg total) by mouth every morning.   clonazePAM 0.5 MG tablet Commonly known as:  KLONOPIN Take 1 tablet (0.5 mg total) by mouth 2 (two) times daily. What changed:  how much to take   diltiazem 180 MG 24 hr  capsule Commonly known as:  CARDIZEM CD Take 1 capsule (180 mg total) by mouth daily.   esomeprazole 40 MG capsule Commonly known as:  NEXIUM Take 40 mg by mouth daily.   EXCEDRIN PO Take 2 tablets by mouth every 4 (four) hours as needed (headaches).   folic acid 1 MG tablet Commonly known as:  FOLVITE Take 1 tablet (1 mg total) by mouth daily.   furosemide 20 MG tablet Commonly known as:  LASIX Take 20 mg by mouth daily with breakfast.   gabapentin 400 MG capsule Commonly  known as:  NEURONTIN Take 400 mg by mouth 3 (three) times daily. As needed for nerve pain   hyoscyamine 0.375 MG 12 hr tablet Commonly known as:  LEVBID Take 0.375 mg by mouth every 12 (twelve) hours as needed for cramping.   levothyroxine 75 MCG tablet Commonly known as:  SYNTHROID, LEVOTHROID Take 75 mcg by mouth daily before breakfast.   multivitamin with minerals Tabs tablet Take 1 tablet by mouth daily as needed (for supplementation).   oxyCODONE-acetaminophen 5-325 MG tablet Commonly known as:  PERCOCET/ROXICET Take 1-2 tablets by mouth every 4 (four) hours as needed for moderate pain.   potassium chloride SA 20 MEQ tablet Commonly known as:  K-DUR,KLOR-CON Take 40 mEq by mouth daily.   QUEtiapine 25 MG tablet Commonly known as:  SEROQUEL Take 1 tablet (25 mg total) by mouth at bedtime.   simvastatin 10 MG tablet Commonly known as:  ZOCOR Take 10 mg by mouth at bedtime.   thiamine 100 MG tablet Take 1 tablet (100 mg total) by mouth daily.   TYLENOL 8 HOUR ARTHRITIS PAIN 650 MG CR tablet Generic drug:  acetaminophen Take 1,300 mg by mouth every 8 (eight) hours as needed for pain.   venlafaxine XR 75 MG 24 hr capsule Commonly known as:  EFFEXOR-XR Take 75 mg by mouth daily with breakfast.   zolpidem 5 MG tablet Commonly known as:  AMBIEN Take 5 mg by mouth at bedtime.       Percocet #8 No Refill  Disposition: Home  Patient's condition: is Good  Follow up: 1. Dr.   Scot Dock in 2 weeks.   Virgina Jock, PA-C [facility] [phone]  --- For [facility] use --- Instructions: Press F2 to tab through selections.  Delete question if not applicable.   Modified Rankin score at D/C (0-6): 0  IV medication needed for:  1. Hypertension: No 2. Hypotension: No  Post-op Complications: No  1. Post-op CVA or TIA: No  2. CN injury: No  3. Myocardial infarction: No  4.  CHF: No  5.  Dysrhythmia (new): No  6. Wound infection: No  7. Reperfusion symptoms: No  8. Return to OR: No   Discharge medications: Statin use:  Yes If No: [ ]  For Medical reasons, [ ]  Non-compliant, [ ]  Not-indicated ASA use:  Yes  If No: [ ]  For Medical reasons, [ ]  Non-compliant, [ ]  Not-indicated Beta blocker use:  Yes If No: [ ]  For Medical reasons, [ ]  Non-compliant, [ ]  Not-indicated ACE-Inhibitor use:  No If No: [ ]  For Medical reasons, [ ]  Non-compliant, [x ] Not-indicated P2Y12 Antagonist use: No, [ ]  Plavix, [ ]  Plasugrel, [ ]  Ticlopinine, [ ]  Ticagrelor, [ ]  Other, [ ]  No for medical reason, [ ]  Non-compliant, [x ] Not-indicated Anti-coagulant use:  No, [ ]  Warfarin, [ ]  Rivaroxaban, [ ]  Dabigatran, [ ]  Other, [ ]  No for medical reason, [ ]  Non-compliant, [x ] Not-indicated

## 2017-01-03 DIAGNOSIS — N183 Chronic kidney disease, stage 3 (moderate): Secondary | ICD-10-CM | POA: Diagnosis not present

## 2017-01-03 DIAGNOSIS — I779 Disorder of arteries and arterioles, unspecified: Secondary | ICD-10-CM | POA: Diagnosis not present

## 2017-01-03 DIAGNOSIS — F419 Anxiety disorder, unspecified: Secondary | ICD-10-CM | POA: Diagnosis not present

## 2017-01-03 DIAGNOSIS — E7800 Pure hypercholesterolemia, unspecified: Secondary | ICD-10-CM | POA: Diagnosis not present

## 2017-01-04 ENCOUNTER — Other Ambulatory Visit: Payer: Self-pay | Admitting: *Deleted

## 2017-01-04 DIAGNOSIS — G8918 Other acute postprocedural pain: Secondary | ICD-10-CM

## 2017-01-04 MED ORDER — OXYCODONE-ACETAMINOPHEN 5-325 MG PO TABS: 1.0000 | ORAL_TABLET | Freq: Four times a day (QID) | ORAL | 0 refills | Status: DC | PRN

## 2017-01-04 NOTE — Telephone Encounter (Signed)
Patient called to see if we could Refill her pain meds. She was Rx'd Percocet 5-325 #8 tablets at discharge. She had a left CEA with Xenosure patch on 12-28-16 by Dr. Scot Dock. She is eating and drinking well and has no problems with breathing. Her incision is well approximated and there is no erythema or drainage. She is afebrile. She states that her pain is 7-10 and 10/10 when she rotates her head. Per Dr. Donnetta Hutching, we will refill her pain medication; Percocet 5-325mg  # 8 tablets and she knows that this has to last until her postop appt.on 01-19-17. Patient voiced understanding and agreement with this plan.

## 2017-01-14 ENCOUNTER — Encounter: Payer: Self-pay | Admitting: Vascular Surgery

## 2017-01-19 ENCOUNTER — Encounter: Payer: Medicare Other | Admitting: Vascular Surgery

## 2017-01-25 DIAGNOSIS — G4700 Insomnia, unspecified: Secondary | ICD-10-CM | POA: Diagnosis not present

## 2017-02-02 ENCOUNTER — Encounter: Payer: Self-pay | Admitting: Vascular Surgery

## 2017-02-09 ENCOUNTER — Ambulatory Visit (INDEPENDENT_AMBULATORY_CARE_PROVIDER_SITE_OTHER): Payer: Self-pay | Admitting: Vascular Surgery

## 2017-02-09 ENCOUNTER — Encounter: Payer: Self-pay | Admitting: Vascular Surgery

## 2017-02-09 VITALS — BP 196/90 | HR 66 | Temp 98.0°F | Resp 20 | Ht 61.0 in | Wt 146.0 lb

## 2017-02-09 DIAGNOSIS — I6523 Occlusion and stenosis of bilateral carotid arteries: Secondary | ICD-10-CM

## 2017-02-09 DIAGNOSIS — Z48812 Encounter for surgical aftercare following surgery on the circulatory system: Secondary | ICD-10-CM

## 2017-02-09 NOTE — Progress Notes (Signed)
Patient name: [name] [name] MRN: [id number] DOB: [birth year] Sex: female  REASON FOR VISIT: Follow up after left carotid endarterectomy.  HPI: [name] [name] is a 81 y.o. female who was admitted in November 2017 with an altered mental status. Workup included an MRI which showed a 90% left carotid stenosis. This was confirmed by duplex. She had a less than 40% right carotid stenosis. Left carotid endarterectomy was recommended in order to lower her risk of future stroke.  She underwent a left carotid endarterectomy with bovine pericardial patch angioplasty on 12/28/2016. She did well postoperatively and returns for her first outpatient visit. She denies any focal weakness or paresthesias.  She is on aspirin and is on a statin.  Current Outpatient Prescriptions  Medication Sig Dispense Refill  . acetaminophen (TYLENOL 8 HOUR ARTHRITIS PAIN) 650 MG CR tablet Take 1,300 mg by mouth every 8 (eight) hours as needed for pain.     [name] amLODipine (NORVASC) 2.5 MG tablet Take 1 tablet (2.5 mg total) by mouth daily. 30 tablet 0  . aspirin EC 81 MG tablet Take 81 mg by mouth daily.    . Aspirin-Acetaminophen-Caffeine (EXCEDRIN PO) Take 2 tablets by mouth every 4 (four) hours as needed (headaches).    [name] atenolol (TENORMIN) 100 MG tablet Take 1 tablet (100 mg total) by mouth every morning. 30 tablet 0  . clonazePAM (KLONOPIN) 0.5 MG tablet Take 1 tablet (0.5 mg total) by mouth 2 (two) times daily. (Patient taking differently: Take 0.25 mg by mouth 2 (two) times daily. ) 30 tablet 0  . diltiazem (CARDIZEM CD) 180 MG 24 hr capsule Take 1 capsule (180 mg total) by mouth daily. 30 capsule 0  . esomeprazole (NEXIUM) 40 MG capsule Take 40 mg by mouth daily.    . folic acid (FOLVITE) 1 MG tablet Take 1 tablet (1 mg total) by mouth daily. 30 tablet 0  . furosemide (LASIX) 20 MG tablet Take 20 mg by mouth daily with breakfast.     . gabapentin (NEURONTIN) 400 MG capsule Take 400 mg by mouth 3 (three) times daily. As  needed for nerve pain    . hyoscyamine (LEVBID) 0.375 MG 12 hr tablet Take 0.375 mg by mouth every 12 (twelve) hours as needed for cramping.     [name] levothyroxine (SYNTHROID, LEVOTHROID) 75 MCG tablet Take 75 mcg by mouth daily before breakfast.     . Multiple Vitamin (MULTIVITAMIN WITH MINERALS) TABS tablet Take 1 tablet by mouth daily as needed (for supplementation).     . potassium chloride SA (K-DUR,KLOR-CON) 20 MEQ tablet Take 40 mEq by mouth daily.     . QUEtiapine (SEROQUEL) 25 MG tablet Take 1 tablet (25 mg total) by mouth at bedtime. 30 tablet 0  . simvastatin (ZOCOR) 10 MG tablet Take 10 mg by mouth at bedtime.    . thiamine 100 MG tablet Take 1 tablet (100 mg total) by mouth daily. 30 tablet 0  . venlafaxine XR (EFFEXOR-XR) 75 MG 24 hr capsule Take 75 mg by mouth daily with breakfast.     . zolpidem (AMBIEN) 5 MG tablet Take 5 mg by mouth at bedtime.      No current facility-administered medications for this visit.     REVIEW OF SYSTEMS:  [X]  denotes positive finding, [ ]  denotes negative finding Cardiac  Comments:  Chest pain or chest pressure:    Shortness of breath upon exertion:    Short of breath when lying flat:    Irregular  heart rhythm:    Constitutional    Fever or chills:      PHYSICAL EXAM: Vitals:   02/09/17 1531 02/09/17 1542  BP: (!) 169/89 (!) 196/90  Pulse: 66   Resp: 20   Temp: 98 F (36.7 C)   TempSrc: Oral   SpO2: 99%   Weight: 146 lb (66.2 kg)   Height: 5\' 1"  (1.549 m)     GENERAL: The patient is a well-nourished female, in no acute distress. The vital signs are documented above. CARDIOVASCULAR: There is a regular rate and rhythm. PULMONARY: There is good air exchange bilaterally without wheezing or rales. Her left neck incision is healing nicely. NEURO: She has no focal weakness or paresthesias.  MEDICAL ISSUES:  STATUS POST LEFT CAROTID ENDARTERECTOMY: The patient is doing well status post left carotid endarterectomy. She is on aspirin and  is on a statin. She has a less than 40% right carotid stenosis. I have ordered a follow up carotid duplex scan in 6 months and I'll see her back at that time. She knows to call sooner if she has problems.  Deitra Mayo [facility] [place] [phone]

## 2017-02-15 NOTE — Addendum Note (Signed)
Addended by: Lianne Cure A on: 02/15/2017 03:27 PM   Modules accepted: Orders

## 2017-03-10 ENCOUNTER — Encounter (HOSPITAL_COMMUNITY): Payer: Self-pay

## 2017-03-10 ENCOUNTER — Inpatient Hospital Stay (HOSPITAL_COMMUNITY)
Admission: EM | Admit: 2017-03-10 | Discharge: 2017-03-15 | DRG: 690 | Disposition: A | Discharge status: Home Health | Payer: Medicare Other | Attending: Family Medicine | Admitting: Family Medicine

## 2017-03-10 DIAGNOSIS — Z887 Allergy status to serum and vaccine status: Secondary | ICD-10-CM

## 2017-03-10 DIAGNOSIS — F419 Anxiety disorder, unspecified: Secondary | ICD-10-CM

## 2017-03-10 DIAGNOSIS — N390 Urinary tract infection, site not specified: Secondary | ICD-10-CM | POA: Diagnosis not present

## 2017-03-10 DIAGNOSIS — E039 Hypothyroidism, unspecified: Secondary | ICD-10-CM | POA: Diagnosis not present

## 2017-03-10 DIAGNOSIS — K589 Irritable bowel syndrome without diarrhea: Secondary | ICD-10-CM | POA: Diagnosis present

## 2017-03-10 DIAGNOSIS — Z8249 Family history of ischemic heart disease and other diseases of the circulatory system: Secondary | ICD-10-CM

## 2017-03-10 DIAGNOSIS — D72829 Elevated white blood cell count, unspecified: Secondary | ICD-10-CM | POA: Diagnosis present

## 2017-03-10 DIAGNOSIS — E876 Hypokalemia: Secondary | ICD-10-CM | POA: Diagnosis present

## 2017-03-10 DIAGNOSIS — R42 Dizziness and giddiness: Secondary | ICD-10-CM | POA: Diagnosis not present

## 2017-03-10 DIAGNOSIS — Z90710 Acquired absence of both cervix and uterus: Secondary | ICD-10-CM

## 2017-03-10 DIAGNOSIS — Z96612 Presence of left artificial shoulder joint: Secondary | ICD-10-CM | POA: Diagnosis present

## 2017-03-10 DIAGNOSIS — Z96652 Presence of left artificial knee joint: Secondary | ICD-10-CM | POA: Diagnosis present

## 2017-03-10 DIAGNOSIS — A084 Viral intestinal infection, unspecified: Secondary | ICD-10-CM | POA: Diagnosis present

## 2017-03-10 DIAGNOSIS — I110 Hypertensive heart disease with heart failure: Secondary | ICD-10-CM | POA: Diagnosis present

## 2017-03-10 DIAGNOSIS — R030 Elevated blood-pressure reading, without diagnosis of hypertension: Secondary | ICD-10-CM | POA: Diagnosis not present

## 2017-03-10 DIAGNOSIS — R1111 Vomiting without nausea: Secondary | ICD-10-CM | POA: Diagnosis not present

## 2017-03-10 DIAGNOSIS — Z79899 Other long term (current) drug therapy: Secondary | ICD-10-CM

## 2017-03-10 DIAGNOSIS — Z9049 Acquired absence of other specified parts of digestive tract: Secondary | ICD-10-CM

## 2017-03-10 DIAGNOSIS — I160 Hypertensive urgency: Secondary | ICD-10-CM

## 2017-03-10 DIAGNOSIS — Z7982 Long term (current) use of aspirin: Secondary | ICD-10-CM

## 2017-03-10 DIAGNOSIS — R1114 Bilious vomiting: Secondary | ICD-10-CM | POA: Diagnosis not present

## 2017-03-10 DIAGNOSIS — I10 Essential (primary) hypertension: Secondary | ICD-10-CM

## 2017-03-10 DIAGNOSIS — F329 Major depressive disorder, single episode, unspecified: Secondary | ICD-10-CM | POA: Diagnosis present

## 2017-03-10 DIAGNOSIS — R112 Nausea with vomiting, unspecified: Secondary | ICD-10-CM

## 2017-03-10 DIAGNOSIS — I5032 Chronic diastolic (congestive) heart failure: Secondary | ICD-10-CM | POA: Diagnosis present

## 2017-03-10 DIAGNOSIS — R531 Weakness: Secondary | ICD-10-CM | POA: Diagnosis present

## 2017-03-10 DIAGNOSIS — E785 Hyperlipidemia, unspecified: Secondary | ICD-10-CM | POA: Diagnosis present

## 2017-03-10 DIAGNOSIS — K219 Gastro-esophageal reflux disease without esophagitis: Secondary | ICD-10-CM | POA: Diagnosis present

## 2017-03-10 DIAGNOSIS — R110 Nausea: Secondary | ICD-10-CM | POA: Diagnosis not present

## 2017-03-10 DIAGNOSIS — W19XXXA Unspecified fall, initial encounter: Secondary | ICD-10-CM | POA: Diagnosis present

## 2017-03-10 DIAGNOSIS — B9620 Unspecified Escherichia coli [E. coli] as the cause of diseases classified elsewhere: Secondary | ICD-10-CM | POA: Diagnosis present

## 2017-03-10 DIAGNOSIS — I480 Paroxysmal atrial fibrillation: Secondary | ICD-10-CM | POA: Diagnosis present

## 2017-03-10 DIAGNOSIS — F411 Generalized anxiety disorder: Secondary | ICD-10-CM | POA: Diagnosis present

## 2017-03-10 DIAGNOSIS — I739 Peripheral vascular disease, unspecified: Secondary | ICD-10-CM | POA: Diagnosis present

## 2017-03-10 DIAGNOSIS — B961 Klebsiella pneumoniae [K. pneumoniae] as the cause of diseases classified elsewhere: Secondary | ICD-10-CM | POA: Diagnosis present

## 2017-03-10 DIAGNOSIS — L4050 Arthropathic psoriasis, unspecified: Secondary | ICD-10-CM | POA: Diagnosis present

## 2017-03-10 DIAGNOSIS — Z9102 Food additives allergy status: Secondary | ICD-10-CM

## 2017-03-10 LAB — CBC
HEMATOCRIT: 45.5 % (ref 36.0–46.0)
HEMOGLOBIN: 15.1 g/dL — AB (ref 12.0–15.0)
MCH: 27.9 pg (ref 26.0–34.0)
MCHC: 33.2 g/dL (ref 30.0–36.0)
MCV: 83.9 fL (ref 78.0–100.0)
Platelets: 208 10*3/uL (ref 150–400)
RBC: 5.42 MIL/uL — AB (ref 3.87–5.11)
RDW: 13.1 % (ref 11.5–15.5)
WBC: 13.4 10*3/uL — AB (ref 4.0–10.5)

## 2017-03-10 LAB — URINALYSIS, ROUTINE W REFLEX MICROSCOPIC
BILIRUBIN URINE: NEGATIVE
GLUCOSE, UA: NEGATIVE mg/dL
KETONES UR: NEGATIVE mg/dL
NITRITE: NEGATIVE
PH: 5 (ref 5.0–8.0)
Protein, ur: >=300 mg/dL — AB
Specific Gravity, Urine: 1.014 (ref 1.005–1.030)

## 2017-03-10 LAB — COMPREHENSIVE METABOLIC PANEL
ALT: 15 U/L (ref 14–54)
AST: 25 U/L (ref 15–41)
Albumin: 4 g/dL (ref 3.5–5.0)
Alkaline Phosphatase: 96 U/L (ref 38–126)
Anion gap: 12 (ref 5–15)
BUN: 11 mg/dL (ref 6–20)
CHLORIDE: 106 mmol/L (ref 101–111)
CO2: 20 mmol/L — AB (ref 22–32)
CREATININE: 0.84 mg/dL (ref 0.44–1.00)
Calcium: 9.3 mg/dL (ref 8.9–10.3)
GFR calc Af Amer: >60 mL/min (ref >60)
Glucose, Bld: 130 mg/dL — ABNORMAL HIGH (ref 65–99)
Potassium: 4.2 mmol/L (ref 3.5–5.1)
SODIUM: 138 mmol/L (ref 135–145)
Total Bilirubin: 1.3 mg/dL — ABNORMAL HIGH (ref 0.3–1.2)
Total Protein: 7.2 g/dL (ref 6.5–8.1)

## 2017-03-10 LAB — I-STAT TROPONIN, ED: TROPONIN I, POC: 0 ng/mL (ref 0.00–0.08)

## 2017-03-10 LAB — POC OCCULT BLOOD, ED: Fecal Occult Bld: NEGATIVE

## 2017-03-10 LAB — LIPASE, BLOOD: LIPASE: 11 U/L (ref 11–51)

## 2017-03-10 MED ORDER — ONDANSETRON HCL 4 MG/2ML IJ SOLN
4.0000 mg | Freq: Once | INTRAMUSCULAR | Status: AC
  Administered 2017-03-10: 4 mg via INTRAVENOUS
  Filled 2017-03-10: qty 2

## 2017-03-10 MED ORDER — ATENOLOL 50 MG PO TABS
100.0000 mg | ORAL_TABLET | ORAL | Status: AC
Start: 2017-03-10 — End: 2017-03-10
  Administered 2017-03-10: 100 mg via ORAL
  Filled 2017-03-10: qty 2

## 2017-03-10 MED ORDER — SODIUM CHLORIDE 0.9 % IV BOLUS (SEPSIS)
500.0000 mL | Freq: Once | INTRAVENOUS | Status: AC
  Administered 2017-03-10: 500 mL via INTRAVENOUS

## 2017-03-10 MED ORDER — DILTIAZEM HCL ER COATED BEADS 180 MG PO CP24
180.0000 mg | ORAL_CAPSULE | Freq: Every day | ORAL | Status: DC
  Administered 2017-03-10 – 2017-03-15 (×6): 180 mg via ORAL
  Filled 2017-03-10 (×6): qty 1

## 2017-03-10 NOTE — ED Notes (Signed)
Pt up to use bedside commode upon sitting up pt became very dizzy and stated that she is very nauseous still and that she threw up about 30 min ago.

## 2017-03-10 NOTE — ED Provider Notes (Signed)
[facility] DEPT Provider Note   CSN: [id number] Arrival date & time: 03/10/17  1739     History   Chief Complaint Chief Complaint  Patient presents with  . Nausea  . Emesis    HPI [name] [name] is a 81 y.o. female.  HPI  This is a 81 year old female with history of right is stenosis, anemia, and acute renal failure who presents today with nausea and vomiting that began yesterday.  She was unable to keep down her medications today. She has had some greenish emesis. She denies abdominal pain. She is not that that think that she has had chills but has had some subjective fever she was given Zofran prior to my valuation. She continues to be very nauseated but has not vomited here in the department.  Past Medical History:  Diagnosis Date  . Alopecia   . Anemia    hx of years ago   . Anxiety   . ARF (acute renal failure) ([place]) 08/23/2014  . Arthritis    psoriatic arthritis  . Back pain   . Blood transfusion    at age of 4  . Carotid stenosis 12/2016  . Chronic diastolic CHF (congestive heart failure) ([place])   . Depression   . Dysrhythmia 11/13/2016   PAFib for a short time- Echo done 11/14/16 PAF- to follow up with PCP- to see if she is a candiate for anticoags.  Not started at times time due to alcohol abuse.  [name] GERD (gastroesophageal reflux disease)   . H/O hiatal hernia   . History of acute cholangitis   . Hyperlipidemia   . Hypertension   . Hypothyroidism   . IBS (irritable bowel syndrome)   . Pancreatitis   . Peripheral vascular disease ([place])   . PONV (postoperative nausea and vomiting)    with Breast reduction- only time  . Rosacea   . Seizures ([place]) 11/13/2016   Thopught to be from withdrawl Benzodiazepine  . Stroke [facility])    patient denies-  . Thyroid disease     Patient Active Problem List   Diagnosis Date Noted  . Carotid stenosis 12/28/2016  . 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 Paroxysmal atrial fibrillation  (H[facility])   . Cerebrovascular disease   . Alcohol withdrawal delirium ([place])   . Fever   . Acute encephalopathy 11/10/2016  . Hypothyroidism 11/10/2016  . Hypertensive urgency 11/10/2016  . Altered mental state 11/10/2016  . Seizure ([place]) 11/10/2016  . Chronic diastolic CHF (congestive heart failure) ([place])   . Left carotid stenosis 10/29/2016  . S/P total knee replacement 03/10/2016  . 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 Ileus ([place]) 09/05/2014  . Leukocytosis, unspecified 09/05/2014  . Vomiting 09/04/2014  . Pulmonary edema 09/04/2014  . Hypokalemia 08/28/2014  . Enteritis due to Clostridium difficile 08/27/2014  . 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 Protein-calorie malnutrition, severe ([place]) 08/24/2014  . Obstructive jaundice 08/23/2014  . Acute cholangitis 08/23/2014  . ARF (acute renal failure) ([place]) 08/23/2014  . Coagulopathy ([place]) 08/23/2014  . Back pain   . Arthritis   . Hypertension   . GERD (gastroesophageal reflux disease)   . Anxiety   . IBS (irritable bowel syndrome)   . Hernia of Right posterior flank 04/18/2012    Past Surgical History:  Procedure Laterality Date  . ABDOMINAL HYSTERECTOMY     partial  . BACK SURGERY  2010   thoractic-screws and rods  . BILE DUCT STENT PLACEMENT  08/26/2014  .  BREAST REDUCTION SURGERY    . CHOLECYSTECTOMY  1988   lap  . COLONOSCOPY    . COLONOSCOPY Left 09/08/2014   Procedure: COLONOSCOPY;  Surgeon: Arta Silence, MD;  Location: [facility] ENDOSCOPY;  Service: Endoscopy;  Laterality: Left;  . ENDARTERECTOMY Left 12/28/2016  . ENDARTERECTOMY Left 12/28/2016   Procedure: ENDARTERECTOMY CAROTID LEFT WITH XENOSURE BIOLOGIC PATCH ANGIOPLASTY;  Surgeon: Angelia Mould, MD;  Location: [facility];  Service: Vascular;  Laterality: Left;  . ERCP    . ERCP N/A 08/26/2014   Procedure: ENDOSCOPIC RETROGRADE CHOLANGIOPANCREATOGRAPHY (ERCP);  Surgeon: Jeryl Columbia, MD;  Location: [facility] ENDOSCOPY;  Service: Endoscopy;  Laterality: N/A;   . ERCP N/A 09/11/2014   Procedure: ENDOSCOPIC RETROGRADE CHOLANGIOPANCREATOGRAPHY (ERCP);  Surgeon: Arta Silence, MD;  Location: [facility] ENDOSCOPY;  Service: Endoscopy;  Laterality: N/A;  . EUS N/A 09/11/2014   Procedure: ESOPHAGEAL ENDOSCOPIC ULTRASOUND (EUS) RADIAL;  Surgeon: Arta Silence, MD;  Location: [facility] ENDOSCOPY;  Service: Endoscopy;  Laterality: N/A;  . EYE SURGERY Bilateral    Cataract  . JOINT REPLACEMENT  2005   left shoulder replacement   . ORIF CLAVICULAR FRACTURE Left 07/18/2014   Procedure: OPEN REDUCTION INTERNAL FIXATION (ORIF) LEFT CLAVICULAR FRACTURE;  Surgeon: Ninetta Lights, MD;  Location: [place];  Service: Orthopedics;  Laterality: Left;  . SPYGLASS CHOLANGIOSCOPY N/A 09/11/2014   Procedure: SPYGLASS CHOLANGIOSCOPY;  Surgeon: Arta Silence, MD;  Location: [facility] ENDOSCOPY;  Service: Endoscopy;  Laterality: N/A;  . TONSILLECTOMY  as child  . TOTAL KNEE ARTHROPLASTY Left 03/10/2016   Procedure: LEFT TOTAL KNEE ARTHROPLASTY;  Surgeon: Ninetta Lights, MD;  Location: [facility];  Service: Orthopedics;  Laterality: Left;  [name] VENTRAL HERNIA REPAIR  06/08/2012   Procedure: LAPAROSCOPIC VENTRAL HERNIA;  Surgeon: Adin Hector, MD;  Location: [facility] ORS;  Service: General;  Laterality: Right;    OB History    No data available       Home Medications    Prior to Admission medications   Medication Sig Start Date End Date Taking? Authorizing Provider  acetaminophen (TYLENOL 8 HOUR ARTHRITIS PAIN) 650 MG CR tablet Take 1,300 mg by mouth every 8 (eight) hours as needed for pain.    Yes Historical Provider, MD  ALPRAZolam Duanne Moron) 0.5 MG tablet Take 0.5 mg by mouth daily as needed for anxiety. 02/18/17  Yes Historical Provider, MD  amLODipine (NORVASC) 2.5 MG tablet Take 1 tablet (2.5 mg total) by mouth daily. 11/19/16  Yes [place] A Regalado, MD  Aspirin-Acetaminophen-Caffeine (EXCEDRIN PO) Take 2 tablets by mouth every 4 (four) hours as needed (headaches).   Yes Historical  Provider, MD  atenolol (TENORMIN) 100 MG tablet Take 1 tablet (100 mg total) by mouth every morning. 11/19/16  Yes Belkys A Regalado, MD  esomeprazole (NEXIUM) 40 MG capsule Take 40 mg by mouth daily. 08/31/16  Yes Historical Provider, MD  folic acid (FOLVITE) 1 MG tablet Take 1 tablet (1 mg total) by mouth daily. 11/19/16  Yes Belkys A Regalado, MD  furosemide (LASIX) 20 MG tablet Take 20 mg by mouth daily with breakfast.    Yes Historical Provider, MD  gabapentin (NEURONTIN) 400 MG capsule Take 400 mg by mouth 3 (three) times daily as needed (neuropathy). As needed for nerve pain   Yes Historical Provider, MD  levothyroxine (SYNTHROID, LEVOTHROID) 75 MCG tablet Take 75 mcg by mouth daily before breakfast.  08/28/16  Yes Historical Provider, MD  potassium chloride SA (K-DUR,KLOR-CON) 20 MEQ tablet Take 40 mEq by mouth  daily.    Yes Historical Provider, MD  QUEtiapine (SEROQUEL) 25 MG tablet Take 1 tablet (25 mg total) by mouth at bedtime. 11/18/16  Yes Belkys A Regalado, MD  simvastatin (ZOCOR) 10 MG tablet Take 10 mg by mouth at bedtime. 08/09/16  Yes Historical Provider, MD  thiamine 100 MG tablet Take 1 tablet (100 mg total) by mouth daily. Patient taking differently: Take 100 mg by mouth 3 (three) times a week.  11/19/16  Yes Belkys A Regalado, MD  venlafaxine XR (EFFEXOR-XR) 150 MG 24 hr capsule Take 150 mg by mouth daily. 02/18/17  Yes Historical Provider, MD  zolpidem (AMBIEN) 10 MG tablet Take 10 mg by mouth at bedtime. 02/14/17  Yes Historical Provider, MD  aspirin EC 81 MG tablet Take 81 mg by mouth daily.    Historical Provider, MD  diltiazem (CARDIZEM CD) 180 MG 24 hr capsule Take 1 capsule (180 mg total) by mouth daily. Patient not taking: Reported on 03/10/2017 11/19/16   Elmarie Shiley, MD    Family History Family History  Problem Relation Age of Onset  . Heart disease Mother   . Cancer Father     lung    Social History Social History  Substance Use Topics  . Smoking status: Never  Smoker  . Smokeless tobacco: Never Used  . Alcohol use No     Comment: rare     Allergies   Tetanus toxoids and Yellow dyes (non-tartrazine)   Review of Systems Review of Systems  All other systems reviewed and are negative.    Physical Exam Updated Vital Signs BP (!) 228/84   Pulse 61   Temp 97.4 F (36.3 C) (Rectal)   Resp (!) 22   Ht 5\' 1"  (1.549 m)   Wt 63.5 kg   SpO2 98%   BMI 26.45 kg/m   Physical Exam  Constitutional: She is oriented to person, place, and time. She appears well-developed and well-nourished. No distress.  HENT:  Head: Normocephalic and atraumatic.  Right Ear: External ear normal.  Left Ear: External ear normal.  Nose: Nose normal.  Eyes: Conjunctivae and EOM are normal. Pupils are equal, round, and reactive to light.  Neck: Normal range of motion. Neck supple.  Cardiovascular: Normal rate, regular rhythm, normal heart sounds and intact distal pulses.   Pulmonary/Chest: Effort normal and breath sounds normal.  Abdominal: Soft. Bowel sounds are normal. There is no tenderness.  Musculoskeletal: Normal range of motion.  Neurological: She is alert and oriented to person, place, and time. She exhibits normal muscle tone. Coordination normal.  Skin: Skin is warm and dry.  Psychiatric: She has a normal mood and affect. Her behavior is normal. Thought content normal.  Nursing note and vitals reviewed.    ED Treatments / Results  Labs (all labs ordered are listed, but only abnormal results are displayed) Labs Reviewed  COMPREHENSIVE METABOLIC PANEL - Abnormal; Notable for the following:       Result Value   CO2 20 (*)    Glucose, Bld 130 (*)    Total Bilirubin 1.3 (*)    All other components within normal limits  CBC - Abnormal; Notable for the following:    WBC 13.4 (*)    RBC 5.42 (*)    Hemoglobin 15.1 (*)    All other components within normal limits  LIPASE, BLOOD  URINALYSIS, ROUTINE W REFLEX MICROSCOPIC  OCCULT BLOOD X 1 CARD TO  LAB, STOOL  POC OCCULT BLOOD, ED  Randolm Idol, ED  EKG  EKG Interpretation  Date/Time:  Thursday March 10 2017 17:44:37 EDT Ventricular Rate:  114 PR Interval:    QRS Duration: 99 QT Interval:  415 QTC Calculation: 497 R Axis:   -17 Text Interpretation:  Normal sinus rhythm Electrode noise No significant change since last tracing Confirmed by Ananya Mccleese MD, Andee Poles ([id number]) on 03/10/2017 6:17:24 PM       Radiology No results found.  Procedures Procedures (including critical care time)  Medications Ordered in ED Medications  ondansetron (ZOFRAN) injection 4 mg (not administered)  sodium chloride 0.9 % bolus 500 mL (not administered)  atenolol (TENORMIN) tablet 100 mg (not administered)  diltiazem (CARDIZEM CD) 24 hr capsule 180 mg (not administered)     Initial Impression / Assessment and Plan / ED Course  I have reviewed the triage vital signs and the nursing notes.  Pertinent labs & imaging results that were available during my care of the patient were reviewed by me and considered in my medical decision making (see chart for details).  1- nausea/vomiting- patient unable to tolerate by mouth fluids and is receiving IV fluids and Antiemetics.Patient has attempted to drink fluids but continues to have green emesis. 2-hypertension-she is unable to tolerate her by mouth fluids today and is extremely hypertensive.  Patient given Her Tenormin and Cardizem here but vomited afterwards.  Discussed with Dr. Tamala Julian and he will see and admit  Final Clinical Impressions(s) / ED Diagnoses   Final diagnoses:  Bilious vomiting with nausea  Hypertension, unspecified type    New Prescriptions New Prescriptions   No medications on file     Pattricia Boss, MD 03/10/17 [id number]

## 2017-03-10 NOTE — ED Triage Notes (Signed)
Pt from home with nausea and vomiting today. Pt has new on set of slight irregular heart beat per EMS. Pt had some a-fib but not consistent. Pt got 8 mg of zofran which helped.

## 2017-03-10 NOTE — ED Notes (Signed)
Assisted pt back to bed cause she felt like she was going to be sick. Pt dry heaving at this time

## 2017-03-10 NOTE — ED Notes (Signed)
Pt vomiting after fluid/po challenge. Emesis green in appearance.

## 2017-03-10 NOTE — H&P (Signed)
History and Physical    ELON LOMELI ZOX:[id number] DOB: [birth year] DOA: 03/10/2017  Referring MD/NP/PA: Dr. Jeanell Sparrow PCP: Kandice Hams, MD  Patient coming from: home  Chief Complaint: nausea and vomiting  HPI: [name] [name] is a 81 y.o. female with medical history significant of chronic diastolic CHF, PAF, psoriatic arthritis, back pain, GERD, and hypothyroidism; who presents with complaints of vomiting starting today. Patient reports that she's actually been nauseous, however for the last 3 days. Patient reports acute onset of nonbilious emesis with inability to keep any fluids or liquids down. Associated symptoms include reports of diarrhea, headache, dysuria, and generalized weakness all starting today. She was very weak today and reports falling hitting her back and right hip. She complains of some mild discomfort on her right hip, but was still able to ambulate and bear weight on the affected leg. Denies any fever, chills, chest pain, shortness of breath, abdominal pain, or leg swelling.  ED Course:  Upon admission into the emergency department patient was seen to be afebrile , pulse 59 and 74, respirations 13-22, blood pressure as high as 228/84, and O2 saturations maintained on room air. Labs revealed WBC 13.4, hemoglobin 15.1, BUN 11, creatinine 0.84, total bilirubin 1.3. Urinalysis appeared to show possible signs of infection versus contamination. Patient was just given a one-time dose of Zofran and 500 mL of normal saline IV fluids.  Review of Systems: As per HPI otherwise 10 point review of systems negative.   Past Medical History:  Diagnosis Date  . Alopecia   . Anemia    hx of years ago   . Anxiety   . ARF (acute renal failure) ([place]) 08/23/2014  . Arthritis    psoriatic arthritis  . Back pain   . Blood transfusion    at age of 4  . Carotid stenosis 12/2016  . Chronic diastolic CHF (congestive heart failure) ([place])   . Depression   . Dysrhythmia 11/13/2016   PAFib for a  short time- Echo done 11/14/16 PAF- to follow up with PCP- to see if she is a candiate for anticoags.  Not started at times time due to alcohol abuse.  [name] GERD (gastroesophageal reflux disease)   . H/O hiatal hernia   . History of acute cholangitis   . Hyperlipidemia   . Hypertension   . Hypothyroidism   . IBS (irritable bowel syndrome)   . Pancreatitis   . Peripheral vascular disease ([place])   . PONV (postoperative nausea and vomiting)    with Breast reduction- only time  . Rosacea   . Seizures ([place]) 11/13/2016   Thopught to be from withdrawl Benzodiazepine  . Stroke [facility])    patient denies-  . Thyroid disease     Past Surgical History:  Procedure Laterality Date  . ABDOMINAL HYSTERECTOMY     partial  . BACK SURGERY  2010   thoractic-screws and rods  . BILE DUCT STENT PLACEMENT  08/26/2014  . BREAST REDUCTION SURGERY    . CHOLECYSTECTOMY  1988   lap  . COLONOSCOPY    . COLONOSCOPY Left 09/08/2014   Procedure: COLONOSCOPY;  Surgeon: Arta Silence, MD;  Location: [facility] ENDOSCOPY;  Service: Endoscopy;  Laterality: Left;  . ENDARTERECTOMY Left 12/28/2016  . ENDARTERECTOMY Left 12/28/2016   Procedure: ENDARTERECTOMY CAROTID LEFT WITH XENOSURE BIOLOGIC PATCH ANGIOPLASTY;  Surgeon: Angelia Mould, MD;  Location: [place];  Service: Vascular;  Laterality: Left;  . ERCP    . ERCP N/A 08/26/2014   Procedure: ENDOSCOPIC RETROGRADE  CHOLANGIOPANCREATOGRAPHY (ERCP);  Surgeon: Jeryl Columbia, MD;  Location: [facility] ENDOSCOPY;  Service: Endoscopy;  Laterality: N/A;  . ERCP N/A 09/11/2014   Procedure: ENDOSCOPIC RETROGRADE CHOLANGIOPANCREATOGRAPHY (ERCP);  Surgeon: Arta Silence, MD;  Location: [facility] ENDOSCOPY;  Service: Endoscopy;  Laterality: N/A;  . EUS N/A 09/11/2014   Procedure: ESOPHAGEAL ENDOSCOPIC ULTRASOUND (EUS) RADIAL;  Surgeon: Arta Silence, MD;  Location: [facility] ENDOSCOPY;  Service: Endoscopy;  Laterality: N/A;  . EYE SURGERY Bilateral    Cataract  . JOINT REPLACEMENT  2005   left shoulder  replacement   . ORIF CLAVICULAR FRACTURE Left 07/18/2014   Procedure: OPEN REDUCTION INTERNAL FIXATION (ORIF) LEFT CLAVICULAR FRACTURE;  Surgeon: Ninetta Lights, MD;  Location: [place];  Service: Orthopedics;  Laterality: Left;  . SPYGLASS CHOLANGIOSCOPY N/A 09/11/2014   Procedure: SPYGLASS CHOLANGIOSCOPY;  Surgeon: Arta Silence, MD;  Location: [facility] ENDOSCOPY;  Service: Endoscopy;  Laterality: N/A;  . TONSILLECTOMY  as child  . TOTAL KNEE ARTHROPLASTY Left 03/10/2016   Procedure: LEFT TOTAL KNEE ARTHROPLASTY;  Surgeon: Ninetta Lights, MD;  Location: [place];  Service: Orthopedics;  Laterality: Left;  [name] VENTRAL HERNIA REPAIR  06/08/2012   Procedure: LAPAROSCOPIC VENTRAL HERNIA;  Surgeon: Adin Hector, MD;  Location: [facility] ORS;  Service: General;  Laterality: Right;     reports that she has never smoked. She has never used smokeless tobacco. She reports that she uses drugs, including Benzodiazepines. She reports that she does not drink alcohol.  Allergies  Allergen Reactions  . Tetanus Toxoids Swelling  . Yellow Dyes (Non-Tartrazine) Itching and Other (See Comments)    Makes patient nervous     Family History  Problem Relation Age of Onset  . Heart disease Mother   . Cancer Father     lung    Prior to Admission medications   Medication Sig Start Date End Date Taking? Authorizing Provider  acetaminophen (TYLENOL 8 HOUR ARTHRITIS PAIN) 650 MG CR tablet Take 1,300 mg by mouth every 8 (eight) hours as needed for pain.    Yes Historical Provider, MD  ALPRAZolam Duanne Moron) 0.5 MG tablet Take 0.5 mg by mouth daily as needed for anxiety. 02/18/17  Yes Historical Provider, MD  amLODipine (NORVASC) 2.5 MG tablet Take 1 tablet (2.5 mg total) by mouth daily. 11/19/16  Yes Belkys A Regalado, MD  Aspirin-Acetaminophen-Caffeine (EXCEDRIN PO) Take 2 tablets by mouth every 4 (four) hours as needed (headaches).   Yes Historical Provider, MD  atenolol (TENORMIN) 100 MG tablet Take 1 tablet (100  mg total) by mouth every morning. 11/19/16  Yes Belkys A Regalado, MD  esomeprazole (NEXIUM) 40 MG capsule Take 40 mg by mouth daily. 08/31/16  Yes Historical Provider, MD  folic acid (FOLVITE) 1 MG tablet Take 1 tablet (1 mg total) by mouth daily. 11/19/16  Yes Belkys A Regalado, MD  furosemide (LASIX) 20 MG tablet Take 20 mg by mouth daily with breakfast.    Yes Historical Provider, MD  gabapentin (NEURONTIN) 400 MG capsule Take 400 mg by mouth 3 (three) times daily as needed (neuropathy). As needed for nerve pain   Yes Historical Provider, MD  levothyroxine (SYNTHROID, LEVOTHROID) 75 MCG tablet Take 75 mcg by mouth daily before breakfast.  08/28/16  Yes Historical Provider, MD  potassium chloride SA (K-DUR,KLOR-CON) 20 MEQ tablet Take 40 mEq by mouth daily.    Yes Historical Provider, MD  QUEtiapine (SEROQUEL) 25 MG tablet Take 1 tablet (25 mg total) by mouth at bedtime. 11/18/16  Yes Belkys  A Regalado, MD  simvastatin (ZOCOR) 10 MG tablet Take 10 mg by mouth at bedtime. 08/09/16  Yes Historical Provider, MD  thiamine 100 MG tablet Take 1 tablet (100 mg total) by mouth daily. Patient taking differently: Take 100 mg by mouth 3 (three) times a week.  11/19/16  Yes Belkys A Regalado, MD  venlafaxine XR (EFFEXOR-XR) 150 MG 24 hr capsule Take 150 mg by mouth daily. 02/18/17  Yes Historical Provider, MD  zolpidem (AMBIEN) 10 MG tablet Take 10 mg by mouth at bedtime. 02/14/17  Yes Historical Provider, MD  aspirin EC 81 MG tablet Take 81 mg by mouth daily.    Historical Provider, MD  diltiazem (CARDIZEM CD) 180 MG 24 hr capsule Take 1 capsule (180 mg total) by mouth daily. Patient not taking: Reported on 03/10/2017 11/19/16   Elmarie Shiley, MD    Physical Exam: Constitutional: NAD, calm, comfortable Vitals:   03/10/17 2000 03/10/17 2030 03/10/17 2130 03/10/17 2300  BP: (!) 200/81 (!) 214/85 (!) 215/69 (!) 212/89  Pulse: 61 62 63 63  Resp: 15 16 18  (!) 21  Temp:      TempSrc:      SpO2: 99% 97% 97% 98%    Weight:      Height:       Eyes: PERRL, lids and conjunctivae normal ENMT: Mucous membranes are moist. Posterior pharynx clear of any exudate or lesions.Normal dentition.  Neck: normal, supple, no masses, no thyromegaly Respiratory: clear to auscultation bilaterally, no wheezing, no crackles. Normal respiratory effort. No accessory muscle use.  Cardiovascular: Regular rate and rhythm, no murmurs / rubs / gallops. No extremity edema. 2+ pedal pulses. No carotid bruits.  Abdomen: no tenderness, no masses palpated. No hepatosplenomegaly. Bowel sounds positive.  Musculoskeletal: no clubbing / cyanosis. No joint deformity upper and lower extremities. Good ROM, no contractures. Normal muscle tone.  Skin: no rashes, lesions, ulcers. No induration Neurologic: CN 2-12 grossly intact. Sensation intact, DTR normal. Strength 5/5 in all 4.  Psychiatric: Normal judgment and insight. Alert and oriented x 3. Normal mood.     Labs on Admission: I have personally reviewed following labs and imaging studies  CBC:  Recent Labs Lab 03/10/17 1800  WBC 13.4*  HGB 15.1*  HCT 45.5  MCV 83.9  PLT 592   Basic Metabolic Panel:  Recent Labs Lab 03/10/17 1800  NA 138  K 4.2  CL 106  CO2 20*  GLUCOSE 130*  BUN 11  CREATININE 0.84  CALCIUM 9.3   GFR: Estimated Creatinine Clearance: 47.1 mL/min (by C-G formula based on SCr of 0.84 mg/dL). Liver Function Tests:  Recent Labs Lab 03/10/17 1800  AST 25  ALT 15  ALKPHOS 96  BILITOT 1.3*  PROT 7.2  ALBUMIN 4.0    Recent Labs Lab 03/10/17 1800  LIPASE 11   No results for input(s): AMMONIA in the last 168 hours. Coagulation Profile: No results for input(s): INR, PROTIME in the last 168 hours. Cardiac Enzymes: No results for input(s): CKTOTAL, CKMB, CKMBINDEX, TROPONINI in the last 168 hours. BNP (last 3 results) No results for input(s): PROBNP in the last 8760 hours. HbA1C: No results for input(s): HGBA1C in the last 72  hours. CBG: No results for input(s): GLUCAP in the last 168 hours. Lipid Profile: No results for input(s): CHOL, HDL, LDLCALC, TRIG, CHOLHDL, LDLDIRECT in the last 72 hours. Thyroid Function Tests: No results for input(s): TSH, T4TOTAL, FREET4, T3FREE, THYROIDAB in the last 72 hours. Anemia Panel: No results for  input(s): VITAMINB12, FOLATE, FERRITIN, TIBC, IRON, RETICCTPCT in the last 72 hours. Urine analysis:    Component Value Date/Time   COLORURINE AMBER (A) 03/10/2017 2011   APPEARANCEUR CLOUDY (A) 03/10/2017 2011   LABSPEC 1.014 03/10/2017 2011   PHURINE 5.0 03/10/2017 2011   GLUCOSEU NEGATIVE 03/10/2017 2011   HGBUR SMALL (A) 03/10/2017 2011   [place] NEGATIVE 03/10/2017 2011   KETONESUR NEGATIVE 03/10/2017 2011   PROTEINUR >=300 (A) 03/10/2017 2011   UROBILINOGEN 0.2 09/04/2014 1340   NITRITE NEGATIVE 03/10/2017 2011   LEUKOCYTESUR SMALL (A) 03/10/2017 2011   Sepsis Labs: No results found for this or any previous visit (from the past 240 hour(s)).   Radiological Exams on Admission: No results found.  EKG: Independently reviewed. Sinus rhythm with background artifact.  Assessment/Plan Intractable nausea and vomiting: Acute than the last 24 hours. Suspect possibility of gastroenteritis versus underlying infection such as UTI causing symptoms. Patient only given Zofran 1 dose while in the ED. - Admit to a telemetry bed - NPO except for meds and will advance diet as tolerated - Monitor In and outs - Check KUB - Zofran/Compazine IV   - gentle IVF NS at 50 ml/hr  Leukocytosis: WBC elevated to 13.4. Urinalysis appears - Check chest x-ray for possible aspiration - Follow-up repeat CBC in a.m. `  Hypertensive urgency: Patient wasSeen to have blood pressure as high as 228/84 on the ED. Patient was given atenolol and diltiazem while in the ED, but unsure if these medications stayed down as patient had vomiting episode thereafter. - Continue diltiazem, atenolol,  amlodipine - Hydralazine IV prn   Abnormal UA /suspected urinary tract infection - Follow-up urine culture - Started on empiric antibiotics of Rocephin IV  Anxiety - Xanax po changed to Ativan IV  Prn anxiety for now  Hypothyroidism - Added on TSH - Continue Synthroid  Hyperlipidemia - Continue simvastatin   DVT prophylaxis: lovenox   Code Status: Full Family Communication: No family present at bedside Disposition Plan: Likely discharge home in 1-2 days  Consults called: none  Admission status: Observation  Norval Morton MD [facility] Pager [phone]  If 7PM-7AM, please contact night-coverage [email or web address] Password [email or web address]  03/10/2017, 11:19 PM

## 2017-03-10 NOTE — ED Notes (Signed)
Pt able to keep water down. Still feels nauseous.

## 2017-03-11 ENCOUNTER — Observation Stay (HOSPITAL_COMMUNITY): Payer: Medicare Other

## 2017-03-11 DIAGNOSIS — N390 Urinary tract infection, site not specified: Secondary | ICD-10-CM | POA: Diagnosis not present

## 2017-03-11 DIAGNOSIS — F329 Major depressive disorder, single episode, unspecified: Secondary | ICD-10-CM | POA: Diagnosis not present

## 2017-03-11 DIAGNOSIS — Z79899 Other long term (current) drug therapy: Secondary | ICD-10-CM | POA: Diagnosis not present

## 2017-03-11 DIAGNOSIS — Z96612 Presence of left artificial shoulder joint: Secondary | ICD-10-CM | POA: Diagnosis present

## 2017-03-11 DIAGNOSIS — A084 Viral intestinal infection, unspecified: Secondary | ICD-10-CM | POA: Diagnosis not present

## 2017-03-11 DIAGNOSIS — R112 Nausea with vomiting, unspecified: Secondary | ICD-10-CM | POA: Diagnosis not present

## 2017-03-11 DIAGNOSIS — R531 Weakness: Secondary | ICD-10-CM | POA: Diagnosis present

## 2017-03-11 DIAGNOSIS — I110 Hypertensive heart disease with heart failure: Secondary | ICD-10-CM | POA: Diagnosis not present

## 2017-03-11 DIAGNOSIS — B961 Klebsiella pneumoniae [K. pneumoniae] as the cause of diseases classified elsewhere: Secondary | ICD-10-CM | POA: Diagnosis present

## 2017-03-11 DIAGNOSIS — E039 Hypothyroidism, unspecified: Secondary | ICD-10-CM | POA: Diagnosis not present

## 2017-03-11 DIAGNOSIS — Z7982 Long term (current) use of aspirin: Secondary | ICD-10-CM | POA: Diagnosis not present

## 2017-03-11 DIAGNOSIS — K589 Irritable bowel syndrome without diarrhea: Secondary | ICD-10-CM | POA: Diagnosis present

## 2017-03-11 DIAGNOSIS — Z96652 Presence of left artificial knee joint: Secondary | ICD-10-CM | POA: Diagnosis present

## 2017-03-11 DIAGNOSIS — W19XXXA Unspecified fall, initial encounter: Secondary | ICD-10-CM | POA: Diagnosis present

## 2017-03-11 DIAGNOSIS — Z9049 Acquired absence of other specified parts of digestive tract: Secondary | ICD-10-CM | POA: Diagnosis not present

## 2017-03-11 DIAGNOSIS — K219 Gastro-esophageal reflux disease without esophagitis: Secondary | ICD-10-CM | POA: Diagnosis not present

## 2017-03-11 DIAGNOSIS — E785 Hyperlipidemia, unspecified: Secondary | ICD-10-CM | POA: Diagnosis not present

## 2017-03-11 DIAGNOSIS — I480 Paroxysmal atrial fibrillation: Secondary | ICD-10-CM | POA: Diagnosis not present

## 2017-03-11 DIAGNOSIS — E038 Other specified hypothyroidism: Secondary | ICD-10-CM | POA: Diagnosis not present

## 2017-03-11 DIAGNOSIS — Z90710 Acquired absence of both cervix and uterus: Secondary | ICD-10-CM | POA: Diagnosis not present

## 2017-03-11 DIAGNOSIS — I5032 Chronic diastolic (congestive) heart failure: Secondary | ICD-10-CM | POA: Diagnosis not present

## 2017-03-11 DIAGNOSIS — B9620 Unspecified Escherichia coli [E. coli] as the cause of diseases classified elsewhere: Secondary | ICD-10-CM | POA: Diagnosis present

## 2017-03-11 DIAGNOSIS — I10 Essential (primary) hypertension: Secondary | ICD-10-CM | POA: Diagnosis not present

## 2017-03-11 DIAGNOSIS — I739 Peripheral vascular disease, unspecified: Secondary | ICD-10-CM | POA: Diagnosis not present

## 2017-03-11 DIAGNOSIS — F419 Anxiety disorder, unspecified: Secondary | ICD-10-CM | POA: Diagnosis not present

## 2017-03-11 DIAGNOSIS — E876 Hypokalemia: Secondary | ICD-10-CM | POA: Diagnosis present

## 2017-03-11 DIAGNOSIS — I160 Hypertensive urgency: Secondary | ICD-10-CM | POA: Diagnosis present

## 2017-03-11 DIAGNOSIS — R1114 Bilious vomiting: Secondary | ICD-10-CM | POA: Diagnosis not present

## 2017-03-11 DIAGNOSIS — L4050 Arthropathic psoriasis, unspecified: Secondary | ICD-10-CM | POA: Diagnosis not present

## 2017-03-11 LAB — CBC
HEMATOCRIT: 41 % (ref 36.0–46.0)
Hemoglobin: 13.7 g/dL (ref 12.0–15.0)
MCH: 27.5 pg (ref 26.0–34.0)
MCHC: 33.4 g/dL (ref 30.0–36.0)
MCV: 82.3 fL (ref 78.0–100.0)
PLATELETS: 254 10*3/uL (ref 150–400)
RBC: 4.98 MIL/uL (ref 3.87–5.11)
RDW: 13 % (ref 11.5–15.5)
WBC: 14.3 10*3/uL — AB (ref 4.0–10.5)

## 2017-03-11 LAB — TSH: TSH: 1.364 u[IU]/mL (ref 0.350–4.500)

## 2017-03-11 LAB — BASIC METABOLIC PANEL
ANION GAP: 15 (ref 5–15)
BUN: 12 mg/dL (ref 6–20)
CALCIUM: 9.2 mg/dL (ref 8.9–10.3)
CO2: 21 mmol/L — AB (ref 22–32)
CREATININE: 0.83 mg/dL (ref 0.44–1.00)
Chloride: 105 mmol/L (ref 101–111)
Glucose, Bld: 129 mg/dL — ABNORMAL HIGH (ref 65–99)
Potassium: 3.2 mmol/L — ABNORMAL LOW (ref 3.5–5.1)
SODIUM: 141 mmol/L (ref 135–145)

## 2017-03-11 MED ORDER — DEXTROSE 5 % IV SOLN
1.0000 g | INTRAVENOUS | Status: DC
  Administered 2017-03-11 – 2017-03-14 (×4): 1 g via INTRAVENOUS
  Filled 2017-03-11 (×5): qty 10

## 2017-03-11 MED ORDER — SODIUM CHLORIDE 0.9 % IV SOLN
INTRAVENOUS | Status: DC
  Administered 2017-03-11 – 2017-03-12 (×2): via INTRAVENOUS

## 2017-03-11 MED ORDER — SODIUM CHLORIDE 0.9 % IV SOLN
30.0000 meq | Freq: Once | INTRAVENOUS | Status: AC
  Administered 2017-03-11: 30 meq via INTRAVENOUS
  Filled 2017-03-11 (×2): qty 15

## 2017-03-11 MED ORDER — PROMETHAZINE HCL 25 MG/ML IJ SOLN
12.5000 mg | Freq: Four times a day (QID) | INTRAMUSCULAR | Status: DC | PRN
  Administered 2017-03-11: 12.5 mg via INTRAVENOUS
  Filled 2017-03-11 (×2): qty 1

## 2017-03-11 MED ORDER — PROCHLORPERAZINE EDISYLATE 5 MG/ML IJ SOLN
10.0000 mg | Freq: Four times a day (QID) | INTRAMUSCULAR | Status: DC | PRN
  Administered 2017-03-11 (×2): 10 mg via INTRAVENOUS
  Filled 2017-03-11 (×3): qty 2

## 2017-03-11 MED ORDER — QUETIAPINE FUMARATE 25 MG PO TABS
25.0000 mg | ORAL_TABLET | Freq: Every day | ORAL | Status: DC
  Administered 2017-03-11 – 2017-03-14 (×4): 25 mg via ORAL
  Filled 2017-03-11 (×4): qty 1

## 2017-03-11 MED ORDER — ZOLPIDEM TARTRATE 5 MG PO TABS
5.0000 mg | ORAL_TABLET | Freq: Every day | ORAL | Status: DC
  Administered 2017-03-11 – 2017-03-14 (×4): 5 mg via ORAL
  Filled 2017-03-11 (×4): qty 1

## 2017-03-11 MED ORDER — ACETAMINOPHEN 325 MG PO TABS
650.0000 mg | ORAL_TABLET | Freq: Four times a day (QID) | ORAL | Status: DC | PRN
Start: 2017-03-11 — End: 2017-03-15

## 2017-03-11 MED ORDER — POTASSIUM CHLORIDE CRYS ER 20 MEQ PO TBCR
40.0000 meq | EXTENDED_RELEASE_TABLET | Freq: Every day | ORAL | Status: DC
  Administered 2017-03-11 – 2017-03-15 (×5): 40 meq via ORAL
  Filled 2017-03-11 (×6): qty 2

## 2017-03-11 MED ORDER — ACETAMINOPHEN 650 MG RE SUPP: 650.0000 mg | Freq: Four times a day (QID) | RECTAL | Status: DC | PRN

## 2017-03-11 MED ORDER — ALPRAZOLAM 0.5 MG PO TABS
0.5000 mg | ORAL_TABLET | Freq: Every day | ORAL | Status: DC | PRN
  Administered 2017-03-12 – 2017-03-14 (×3): 0.5 mg via ORAL
  Filled 2017-03-11 (×3): qty 1

## 2017-03-11 MED ORDER — DEXTROSE 5 % IV SOLN
1.0000 g | INTRAVENOUS | Status: AC
  Administered 2017-03-11: 1 g via INTRAVENOUS
  Filled 2017-03-11: qty 10

## 2017-03-11 MED ORDER — ONDANSETRON HCL 4 MG PO TABS: 4.0000 mg | ORAL_TABLET | Freq: Four times a day (QID) | ORAL | Status: DC | PRN

## 2017-03-11 MED ORDER — AMLODIPINE BESYLATE 2.5 MG PO TABS
2.5000 mg | ORAL_TABLET | Freq: Every day | ORAL | Status: DC
  Administered 2017-03-11 – 2017-03-13 (×3): 2.5 mg via ORAL
  Filled 2017-03-11 (×3): qty 1

## 2017-03-11 MED ORDER — SIMVASTATIN 10 MG PO TABS
10.0000 mg | ORAL_TABLET | Freq: Every day | ORAL | Status: DC
  Administered 2017-03-11 – 2017-03-14 (×4): 10 mg via ORAL
  Filled 2017-03-11 (×4): qty 1

## 2017-03-11 MED ORDER — LORAZEPAM 2 MG/ML IJ SOLN: 0.2500 mg | Freq: Four times a day (QID) | INTRAMUSCULAR | Status: DC | PRN

## 2017-03-11 MED ORDER — ONDANSETRON HCL 4 MG/2ML IJ SOLN
4.0000 mg | Freq: Four times a day (QID) | INTRAMUSCULAR | Status: DC | PRN
  Administered 2017-03-11: 4 mg via INTRAVENOUS
  Filled 2017-03-11: qty 2

## 2017-03-11 MED ORDER — VENLAFAXINE HCL ER 75 MG PO CP24
150.0000 mg | ORAL_CAPSULE | Freq: Every day | ORAL | Status: DC
  Administered 2017-03-11 – 2017-03-15 (×5): 150 mg via ORAL
  Filled 2017-03-11 (×5): qty 2

## 2017-03-11 MED ORDER — ALBUTEROL SULFATE (2.5 MG/3ML) 0.083% IN NEBU: 2.5000 mg | INHALATION_SOLUTION | RESPIRATORY_TRACT | Status: DC | PRN

## 2017-03-11 MED ORDER — ASPIRIN EC 81 MG PO TBEC
81.0000 mg | DELAYED_RELEASE_TABLET | Freq: Every day | ORAL | Status: DC
  Administered 2017-03-11 – 2017-03-15 (×5): 81 mg via ORAL
  Filled 2017-03-11 (×5): qty 1

## 2017-03-11 MED ORDER — ENOXAPARIN SODIUM 40 MG/0.4ML ~~LOC~~ SOLN
40.0000 mg | Freq: Every day | SUBCUTANEOUS | Status: DC
  Administered 2017-03-11 – 2017-03-14 (×4): 40 mg via SUBCUTANEOUS
  Filled 2017-03-11 (×5): qty 0.4

## 2017-03-11 MED ORDER — PANTOPRAZOLE SODIUM 40 MG PO TBEC
40.0000 mg | DELAYED_RELEASE_TABLET | Freq: Every day | ORAL | Status: DC
  Administered 2017-03-11 – 2017-03-15 (×5): 40 mg via ORAL
  Filled 2017-03-11 (×5): qty 1

## 2017-03-11 MED ORDER — GABAPENTIN 400 MG PO CAPS: 400.0000 mg | ORAL_CAPSULE | Freq: Three times a day (TID) | ORAL | Status: DC | PRN

## 2017-03-11 MED ORDER — HYDRALAZINE HCL 20 MG/ML IJ SOLN
10.0000 mg | INTRAMUSCULAR | Status: DC | PRN
  Administered 2017-03-11 – 2017-03-14 (×3): 10 mg via INTRAVENOUS
  Filled 2017-03-11 (×4): qty 1

## 2017-03-11 MED ORDER — LEVOTHYROXINE SODIUM 75 MCG PO TABS
75.0000 ug | ORAL_TABLET | Freq: Every day | ORAL | Status: DC
  Administered 2017-03-12 – 2017-03-15 (×4): 75 ug via ORAL
  Filled 2017-03-11 (×4): qty 1

## 2017-03-11 MED ORDER — ONDANSETRON HCL 4 MG/2ML IJ SOLN
4.0000 mg | Freq: Four times a day (QID) | INTRAMUSCULAR | Status: DC
  Administered 2017-03-11 – 2017-03-13 (×8): 4 mg via INTRAVENOUS
  Filled 2017-03-11 (×8): qty 2

## 2017-03-11 MED ORDER — POTASSIUM CHLORIDE CRYS ER 20 MEQ PO TBCR
40.0000 meq | EXTENDED_RELEASE_TABLET | ORAL | Status: DC
  Filled 2017-03-11: qty 2

## 2017-03-11 MED ORDER — FOLIC ACID 1 MG PO TABS
1.0000 mg | ORAL_TABLET | Freq: Every day | ORAL | Status: DC
  Administered 2017-03-11 – 2017-03-15 (×5): 1 mg via ORAL
  Filled 2017-03-11 (×5): qty 1

## 2017-03-11 MED ORDER — VITAMIN B-1 100 MG PO TABS
100.0000 mg | ORAL_TABLET | ORAL | Status: DC
  Administered 2017-03-11 – 2017-03-14 (×2): 100 mg via ORAL
  Filled 2017-03-11 (×2): qty 1

## 2017-03-11 NOTE — Care Management Obs Status (Signed)
[place] NOTIFICATION   Patient Details  Name: [name] [name] MRN: [id number] Date of Birth: 12-27-1935   Medicare Observation Status Notification Given:  Yes    Carles Collet, RN 03/11/2017, 2:33 PM

## 2017-03-11 NOTE — Progress Notes (Signed)
Pharmacy Antibiotic Note  [name] [name] is a 81 y.o. female admitted on 03/10/2017 with UTI.  Pharmacy has been consulted for Ceftriaxone dosing. Here with N/V, found to have abnormal U/A. WBC mildly elevated.   Plan: -Ceftriaxone 1g IV q24h -F/U urine culture for directed therapy  Height: 5\' 1"  (154.9 cm) Weight: 140 lb (63.5 kg) IBW/kg (Calculated) : 47.8  Temp (24hrs), Avg:97.8 F (36.6 C), Min:97.4 F (36.3 C), Max:98.1 F (36.7 C)   Recent Labs Lab 03/10/17 1800  WBC 13.4*  CREATININE 0.84    Estimated Creatinine Clearance: 47.1 mL/min (by C-G formula based on SCr of 0.84 mg/dL).    Allergies  Allergen Reactions  . Tetanus Toxoids Swelling  . Yellow Dyes (Non-Tartrazine) Itching and Other (See Comments)    Makes patient nervous     [name] 03/11/2017 12:36 AM

## 2017-03-11 NOTE — ED Notes (Signed)
Smith, MD at bedside.  

## 2017-03-11 NOTE — Progress Notes (Signed)
Pt tolerated glass of water and morning medications. No reports of emesis. Ordered pt clear liquid tray and she does not want to try anything at this time. Will continue to monitor  [name] [name]

## 2017-03-11 NOTE — Progress Notes (Addendum)
PROGRESS NOTE        PATIENT DETAILS Name: [name] [name] Age: 81 y.o. Sex: female Date of Birth: April 01, 1936 Admit Date: 03/10/2017 Admitting Physician Norval Morton, MD GNO:[id number] D, MD  Brief Narrative: Patient is a 81 y.o. female with past medical history of chronic diastolic heart failure, hypertension, hypothyroidism, dyslipidemia-history of left carotid endarterectomy January 2018, paroxysmal atrial fibrillation-and not felt to be a anticoagulation for prior chart review (see discharge summary on 11/18/16) admitted for intractable vomiting for the past 2 days. See below for further details  Subjective: Vomited earlier this morning. No vomiting overnight. Vomiting Seems to have slowed down. Also had a few episodes of loose stools yesterday-none since admission.  ROS: No abdominal pain  No SOB No headache  Assessment/Plan: Intractable vomiting: Suspect a viral syndrome. Her abdominal exam is completely benign. Denies any sick contacts, her husband does not have any of her symptoms. She did have some diarrhea yesterday that has since resolved. I would continue with supportive measures, will change Zofran to schedule and advance to clear liquids when she is able to tolerate diet. If she continues to vomit over the next few days, we will then initiate further workup.  Hypokalemia: Replete and recheck. Likely secondary to GI loss in the setting of vomiting.  UTI: Denies dysuria, but does acknowledge frequency. Continue Rocephin, await cultures-would plan a 3 day course.  Anxiety: Continue as needed Xanax.  Hypertension: Blood pressure was uncontrolled on admission-slowly improving-continue with amlodipine, atenolol for now-watch another 24 hours before adjusting any further. Suspect as the vomiting improves, blood pressure should improve with time.  Hypothyroidism: Continue with Synthroid  Dyslipidemia: Continue Zocor.  Depression: Continue  Seroquel and Effexor or  Anxiety: Slightly anxious due to ongoing vomiting-continue as needed Xanax for now.  History of paroxysmal atrial fibrillation: Maintaining sinus rhythm, reviewed most recent discharge summary December 2017-continue with aspirin.  DVT Prophylaxis: Prophylactic Lovenox   Code Status: Full code   Family Communication: None at bedside  Disposition Plan: Remain inpatient-home when no longer vomting  Antimicrobial agents: Anti-infectives    Start     Dose/Rate Route Frequency Ordered Stop   03/11/17 2200  cefTRIAXone (ROCEPHIN) 1 g in dextrose 5 % 50 mL IVPB     1 g 100 mL/hr over 30 Minutes Intravenous Every 24 hours 03/11/17 0038     03/11/17 0030  cefTRIAXone (ROCEPHIN) 1 g in dextrose 5 % 50 mL IVPB     1 g 100 mL/hr over 30 Minutes Intravenous STAT 03/11/17 0023 03/11/17 0142      Procedures: None  CONSULTS:  None  Time spent: 25 minutes-Greater than 50% of this time was spent in counseling, explanation of diagnosis, planning of further management, and coordination of care.  MEDICATIONS: Scheduled Meds: . amLODipine  2.5 mg Oral Daily  . aspirin EC  81 mg Oral Daily  . cefTRIAXone (ROCEPHIN)  IV  1 g Intravenous Q24H  . diltiazem  180 mg Oral Daily  . enoxaparin (LOVENOX) injection  40 mg Subcutaneous Daily  . folic acid  1 mg Oral Daily  . [START ON 03/12/2017] levothyroxine  75 mcg Oral QAC breakfast  . ondansetron (ZOFRAN) IV  4 mg Intravenous Q6H  . pantoprazole  40 mg Oral Daily  . potassium chloride (KCL MULTIRUN) 30 mEq in 265 mL IVPB  30 mEq Intravenous  Once  . potassium chloride  40 mEq Oral STAT  . potassium chloride SA  40 mEq Oral Daily  . QUEtiapine  25 mg Oral QHS  . simvastatin  10 mg Oral QHS  . thiamine  100 mg Oral Once per day on Mon Wed Fri  . venlafaxine XR  150 mg Oral Daily  . zolpidem  5 mg Oral QHS   Continuous Infusions: . sodium chloride 50 mL/hr at 03/11/17 0234   PRN Meds:.acetaminophen **OR**  acetaminophen, albuterol, ALPRAZolam, gabapentin, hydrALAZINE, promethazine   PHYSICAL EXAM: Vital signs: Vitals:   03/11/17 0100 03/11/17 0126 03/11/17 0218 03/11/17 0455  BP: (!) 208/86 (!) 154/87 (!) 192/62 (!) 172/60  Pulse: 66 74 77 73  Resp: 20 (!) 21 20 18   Temp:   98.2 F (36.8 C) 98.2 F (36.8 C)  TempSrc:   Oral Oral  SpO2: 95% 96% 98% 95%  Weight:   62 kg (136 lb 11.2 oz)   Height:   5\' 1"  (1.549 m)    Filed Weights   03/10/17 1746 03/11/17 0218  Weight: 63.5 kg (140 lb) 62 kg (136 lb 11.2 oz)   Body mass index is 25.83 kg/m.   General appearance :Awake, alert, not in any distress. Speech Clear. Not toxic Looking Eyes:, pupils equally reactive to light and accomodation,no scleral icterus.Pink conjunctiva HEENT: Atraumatic and Normocephalic Neck: supple, no JVD. No cervical lymphadenopathy. No thyromegaly Resp:Good air entry bilaterally, no added sounds  CVS: S1 S2 regular, no murmurs.  GI: Bowel sounds present, Non tender and not distended with no gaurding, rigidity or rebound.No organomegaly Extremities: B/L Lower Ext shows no edema, both legs are warm to touch Neurology:  speech clear,Non focal, sensation is grossly intact. Psychiatric: Normal judgment and insight. Alert and oriented x 3. Normal mood. Musculoskeletal:No digital cyanosis Skin:No Rash, warm and dry Wounds:N/A  I have personally reviewed following labs and imaging studies  LABORATORY DATA: CBC:  Recent Labs Lab 03/10/17 1800 03/11/17 0423  WBC 13.4* 14.3*  HGB 15.1* 13.7  HCT 45.5 41.0  MCV 83.9 82.3  PLT 208 937    Basic Metabolic Panel:  Recent Labs Lab 03/10/17 1800 03/11/17 0423  NA 138 141  K 4.2 3.2*  CL 106 105  CO2 20* 21*  GLUCOSE 130* 129*  BUN 11 12  CREATININE 0.84 0.83  CALCIUM 9.3 9.2    GFR: Estimated Creatinine Clearance: 47.2 mL/min (by C-G formula based on SCr of 0.83 mg/dL).  Liver Function Tests:  Recent Labs Lab 03/10/17 1800  AST 25  ALT  15  ALKPHOS 96  BILITOT 1.3*  PROT 7.2  ALBUMIN 4.0    Recent Labs Lab 03/10/17 1800  LIPASE 11   No results for input(s): AMMONIA in the last 168 hours.  Coagulation Profile: No results for input(s): INR, PROTIME in the last 168 hours.  Cardiac Enzymes: No results for input(s): CKTOTAL, CKMB, CKMBINDEX, TROPONINI in the last 168 hours.  BNP (last 3 results) No results for input(s): PROBNP in the last 8760 hours.  HbA1C: No results for input(s): HGBA1C in the last 72 hours.  CBG: No results for input(s): GLUCAP in the last 168 hours.  Lipid Profile: No results for input(s): CHOL, HDL, LDLCALC, TRIG, CHOLHDL, LDLDIRECT in the last 72 hours.  Thyroid Function Tests:  Recent Labs  03/11/17 0423  TSH 1.364    Anemia Panel: No results for input(s): VITAMINB12, FOLATE, FERRITIN, TIBC, IRON, RETICCTPCT in the last 72 hours.  Urine analysis:  Component Value Date/Time   COLORURINE AMBER (A) 03/10/2017 2011   APPEARANCEUR CLOUDY (A) 03/10/2017 2011   LABSPEC 1.014 03/10/2017 2011   PHURINE 5.0 03/10/2017 2011   GLUCOSEU NEGATIVE 03/10/2017 2011   HGBUR SMALL (A) 03/10/2017 2011   BILIRUBINUR NEGATIVE 03/10/2017 2011   KETONESUR NEGATIVE 03/10/2017 2011   PROTEINUR >=300 (A) 03/10/2017 2011   UROBILINOGEN 0.2 09/04/2014 1340   NITRITE NEGATIVE 03/10/2017 2011   LEUKOCYTESUR SMALL (A) 03/10/2017 2011    Sepsis Labs: Lactic Acid, Venous No results found for: LATICACIDVEN  MICROBIOLOGY: No results found for this or any previous visit (from the past 240 hour(s)).  RADIOLOGY STUDIES/RESULTS: Dg Abd 1 View  Result Date: 03/11/2017 CLINICAL DATA:  Nausea, vomiting. EXAM: ABDOMEN - 1 VIEW COMPARISON:  11/11/2016 FINDINGS: Postoperative changes in the thoracolumbar spine. No evidence of bowel obstruction. No supine evidence of free air. No organomegaly or suspicious calcification. IMPRESSION: No acute findings. Electronically Signed   By: Rolm Baptise M.D.   On:  03/11/2017 09:37   Dg Chest Port 1 View  Result Date: 03/11/2017 CLINICAL DATA:  Intractable nausea and vomiting. EXAM: PORTABLE CHEST 1 VIEW COMPARISON:  Chest radiograph 11/12/2016 FINDINGS: Low lung volumes persist. Unchanged heart size and mediastinal contours. Stable mild elevation of right hemidiaphragm. No pulmonary edema, focal airspace disease, pleural effusion or pneumothorax. Postsurgical change of the left distal clavicle and humerus. Postsurgical change in the thoracolumbar spine, partially included. The bones are under mineralized. IMPRESSION: Unchanged low lung volumes without acute abnormality. Electronically Signed   By: Jeb Levering M.D.   On: 03/11/2017 00:41     LOS: 0 days   Oren Binet, MD  [facility] Pager:[phone]  If 7PM-7AM, please contact night-coverage [email or web address] Password [email or web address] 03/11/2017, 10:00 AM

## 2017-03-11 NOTE — ED Notes (Signed)
Pt in room vomiting   

## 2017-03-11 NOTE — Evaluation (Signed)
Physical Therapy Evaluation Patient Details Name: [name] [name] MRN: [id number] DOB: [birth year] Today's Date: 03/11/2017   History of Present Illness  Pt is a 81 y.o. female admitted to ED on 03/10/17 secondary to 3-day history of nausea and recent fall; suspect due to viral syndrome. Pertinent PMH includes CHF, HTN, psoriatic arthritis, back pain, GERD, CVA, depression, PVD, a-fib, seizures.   Clinical Impression  Pt presents to PT with generalized weakness, increased confusion, dizziness with position changes, and an overall decrease in functional mobility secondary to above. PTA, pt was indep with all functional mobility and ADLs, and lives at home with husband; pt acknowledged confusion regarding PLOF and home set-up, but husband able to verify her details are correct via phone convo. Husband reports ability to provide 24-7 care at home. Today, pt able to stand and take steps to chair with minA; limited by c/o dizziness with sitting and standing. Pt would benefit from continued PT services to maximize functional mobility and independence.    Follow Up Recommendations SNF;Supervision for mobility/OOB    Equipment Recommendations  None recommended by PT    Recommendations for Other Services OT consult     Precautions / Restrictions Precautions Precautions: Fall Restrictions Weight Bearing Restrictions: No      Mobility  Bed Mobility Overal bed mobility: Needs Assistance Bed Mobility: Supine to Sit     Supine to sit: HOB elevated;Min assist     General bed mobility comments: Min assist to scoot hips to EOB.   Transfers Overall transfer level: Needs assistance Equipment used: Rolling walker (2 wheeled) Transfers: Sit to/from Stand Sit to Stand: Min assist         General transfer comment: Sit<>stand x4 with RW and minA for balance and trunk support. C/o dizziness with standing, needing to sit immediately x1; subsided in sitting.  Ambulation/Gait Ambulation/Gait  assistance: Min assist Ambulation Distance (Feet): 3 Feet Assistive device: Rolling walker (2 wheeled) Gait Pattern/deviations: Step-to pattern     General Gait Details: Amb a few steps to bedside chair with RW and minA for balance. Unable to amb further secondary to c/o dizziness with amb.  Stairs            Wheelchair Mobility    Modified Rankin (Stroke Patients Only)       Balance Overall balance assessment: Needs assistance Sitting-balance support: No upper extremity supported;Feet supported Sitting balance-Leahy Scale: Fair     Standing balance support: Bilateral upper extremity supported;During functional activity Standing balance-Leahy Scale: Poor Standing balance comment: Reliant on RW for standing balance                             Pertinent Vitals/Pain Pain Assessment: No/denies pain    Home Living Family/patient expects to be discharged to:: Private residence Living Arrangements: Spouse/significant other Available Help at Discharge: Available 24 hours/day;Family (Husband) Type of Home: House Home Access: Level entry     Home Layout: Two level Home Equipment: Environmental consultant - 2 wheels;Cane - single point;Bedside commode      Prior Function Level of Independence: Independent         Comments: Pt reports indep with all mobility and ADLs, still driving; husband able to confirm this over phone     Hand Dominance        Extremity/Trunk Assessment   Upper Extremity Assessment Upper Extremity Assessment: Generalized weakness    Lower Extremity Assessment Lower Extremity Assessment: Generalized weakness    Cervical /  Trunk Assessment Cervical / Trunk Assessment: Kyphotic  Communication   Communication: HOH  Cognition Arousal/Alertness: Awake/alert Behavior During Therapy: WFL for tasks assessed/performed Overall Cognitive Status: No family/caregiver present to determine baseline cognitive functioning Area of Impairment:  Attention;Memory;Following commands;Awareness;Problem solving                   Current Attention Level: Sustained Memory: Decreased short-term memory Following Commands: Follows one step commands with increased time;Follows multi-step commands inconsistently   Awareness: Emergent Problem Solving: Slow processing;Requires verbal cues General Comments: Pt A&O x4. Reports feeling "scatter brained" and anxious about her confusion. Husband able to verify all details are correct from pt regarding PLOF and home set-up. Pt occasionally saying something unrelated to conversation, but then says "I'm sorry, that's not right". According to her and her husband (via phone convo), she has no cognitive deficits at baseline.       General Comments General comments (skin integrity, edema, etc.): C/o dizziness with all position changes; sitting BP 130s/50s, standing BP 116/53    Exercises     Assessment/Plan    PT Assessment Patient needs continued PT services  PT Problem List Decreased strength;Decreased mobility;Decreased safety awareness;Decreased activity tolerance;Decreased cognition;Decreased balance;Cardiopulmonary status limiting activity;Decreased knowledge of use of DME       PT Treatment Interventions DME instruction;Gait training;Therapeutic activities;Therapeutic exercise;Cognitive remediation;Patient/family education;Balance training;Stair training;Functional mobility training    PT Goals (Current goals can be found in the Care Plan section)  Acute Rehab PT Goals Patient Stated Goal: Return home and feel less confused PT Goal Formulation: With patient Time For Goal Achievement: 03/25/17 Potential to Achieve Goals: Good    Frequency Min 2X/week   Barriers to discharge        Co-evaluation               End of Session Equipment Utilized During Treatment: Gait belt Activity Tolerance: Patient tolerated treatment well;Other (comment) (Limited by dizziness) Patient  left: in chair;with chair alarm set;with call bell/phone within reach Nurse Communication: Mobility status PT Visit Diagnosis: Muscle weakness (generalized) (M62.81);Unsteadiness on feet (R26.81)    Time: 8343-7357 PT Time Calculation (min) (ACUTE ONLY): 42 min   Charges:   PT Evaluation $PT Eval Low Complexity: 1 Procedure PT Treatments $Therapeutic Activity: 23-37 mins   PT G Codes:   PT G-Codes **NOT FOR INPATIENT CLASS** Functional Assessment Tool Used: Clinical judgement Functional Limitation: Mobility: Walking and moving around Mobility: Walking and Moving Around Current Status (I9784): At least 20 percent but less than 40 percent impaired, limited or restricted Mobility: Walking and Moving Around Goal Status [phone]): At least 1 percent but less than 20 percent impaired, limited or restricted   Enis Gash, SPT [phone]  Mabeline Caras 03/11/2017, 4:17 PM

## 2017-03-11 NOTE — Progress Notes (Signed)
MD aware pt has been vomiting green on night shift, not seen on day shift. MD ordered EKG, completed and placed in chart. Tried to progress pt diet, pt states she can not eat or drink anything at this time. Will try again later  [name] [name]

## 2017-03-11 NOTE — Progress Notes (Signed)
Patient arrived to unit, complained of nausea. Patient oriented to unit, CCMD called, call bell within reach.

## 2017-03-11 NOTE — Progress Notes (Addendum)
Patient hasn't vomited since admitted to unit, has complained of some nausea and "spit" (green in color) after dry heaving. No real emesis.

## 2017-03-12 DIAGNOSIS — R1114 Bilious vomiting: Secondary | ICD-10-CM

## 2017-03-12 DIAGNOSIS — E038 Other specified hypothyroidism: Secondary | ICD-10-CM

## 2017-03-12 LAB — BASIC METABOLIC PANEL
Anion gap: 9 (ref 5–15)
BUN: 17 mg/dL (ref 6–20)
CALCIUM: 9 mg/dL (ref 8.9–10.3)
CO2: 22 mmol/L (ref 22–32)
CREATININE: 0.96 mg/dL (ref 0.44–1.00)
Chloride: 110 mmol/L (ref 101–111)
GFR calc Af Amer: >60 mL/min (ref >60)
GFR calc non Af Amer: 55 mL/min — ABNORMAL LOW (ref >60)
Glucose, Bld: 122 mg/dL — ABNORMAL HIGH (ref 65–99)
Potassium: 3.5 mmol/L (ref 3.5–5.1)
SODIUM: 141 mmol/L (ref 135–145)

## 2017-03-12 LAB — CBC
HCT: 37.8 % (ref 36.0–46.0)
Hemoglobin: 12.3 g/dL (ref 12.0–15.0)
MCH: 27.3 pg (ref 26.0–34.0)
MCHC: 32.5 g/dL (ref 30.0–36.0)
MCV: 84 fL (ref 78.0–100.0)
PLATELETS: 210 10*3/uL (ref 150–400)
RBC: 4.5 MIL/uL (ref 3.87–5.11)
RDW: 13.5 % (ref 11.5–15.5)
WBC: 11.2 10*3/uL — ABNORMAL HIGH (ref 4.0–10.5)

## 2017-03-12 LAB — MAGNESIUM: MAGNESIUM: 1.7 mg/dL (ref 1.7–2.4)

## 2017-03-12 MED ORDER — POTASSIUM CHLORIDE CRYS ER 20 MEQ PO TBCR
40.0000 meq | EXTENDED_RELEASE_TABLET | Freq: Once | ORAL | Status: AC
  Administered 2017-03-12: 40 meq via ORAL
  Filled 2017-03-12: qty 2

## 2017-03-12 MED ORDER — SODIUM CHLORIDE 0.9 % IV SOLN
INTRAVENOUS | Status: DC
  Administered 2017-03-12: 50 mL/h via INTRAVENOUS

## 2017-03-12 MED ORDER — ATENOLOL 50 MG PO TABS
50.0000 mg | ORAL_TABLET | Freq: Every day | ORAL | Status: DC
  Administered 2017-03-12 – 2017-03-15 (×4): 50 mg via ORAL
  Filled 2017-03-12 (×5): qty 1

## 2017-03-12 NOTE — Progress Notes (Signed)
PROGRESS NOTE        PATIENT DETAILS Name: [name] [name] Age: 81 y.o. Sex: female Date of Birth: 03-25-1936 Admit Date: 03/10/2017 Admitting Physician Norval Morton, MD ION:[id number],UXLKGM D, MD  Brief Narrative: Patient is a 81 y.o. female with past medical history of chronic diastolic heart failure, hypertension, hypothyroidism, dyslipidemia-history of left carotid endarterectomy January 2018, paroxysmal atrial fibrillation-and not felt to be a anticoagulation for prior chart review (see discharge summary on 11/18/16) admitted for intractable vomiting for the past 2 days due to combination of UTI and possibly gastroenteritis as well.    Subjective: Patient in bed, denies any headache, no fever or chills, no abdominal pain, minimal nausea but no emesis in the last 16-18 hours. No abdominal pain or chest pain. No focal weakness. Does have some generalized weakness.  Assessment/Plan:  UTI: Denies dysuria, but does acknowledge frequency. Continue Rocephin, await cultures-would plan a 3 day course.  Intractable vomiting: Likely due to viral gastroenteritis, x-ray unremarkable, was placed on scheduled Zofran along with as needed Phenergan with good relief, no emesis in the last 16-18 hours, only mild nausea, will advance diet to soft, gentle hydration for another 10 hours and monitor. If stable discontinue scheduled Zofran tomorrow and switch to when necessary Zofran with further increasing her diet.  Hypokalemia: Repleted , monitor.  Anxiety: Continue as needed Xanax.  Hypertension: Norvasc and diltiazem, resume atenolol at 50 mg daily and monitor.  Hypothyroidism: Continue with Synthroid, TSH stable.  Dyslipidemia: Continue Zocor.  Depression: Continue Seroquel and Effexor.  History of paroxysmal atrial fibrillation: Maintaining sinus rhythm, reviewed most recent discharge summary December 2017-continue with aspirin. Continue on combination of beta blocker and  diltiazem.  Generalized weakness. Seen by PT. Recommended SNF, social work informed to arrange.   DVT Prophylaxis: Prophylactic Lovenox   Code Status: Full code   Family Communication: None at bedside  Disposition Plan: SNF  Antimicrobial agents: Anti-infectives    Start     Dose/Rate Route Frequency Ordered Stop   03/11/17 2200  cefTRIAXone (ROCEPHIN) 1 g in dextrose 5 % 50 mL IVPB     1 g 100 mL/hr over 30 Minutes Intravenous Every 24 hours 03/11/17 0038     03/11/17 0030  cefTRIAXone (ROCEPHIN) 1 g in dextrose 5 % 50 mL IVPB     1 g 100 mL/hr over 30 Minutes Intravenous STAT 03/11/17 0023 03/11/17 0142      Procedures: None  CONSULTS:  None  Time spent: 25 minutes-Greater than 50% of this time was spent in counseling, explanation of diagnosis, planning of further management, and coordination of care.  MEDICATIONS: Scheduled Meds: . amLODipine  2.5 mg Oral Daily  . aspirin EC  81 mg Oral Daily  . cefTRIAXone (ROCEPHIN)  IV  1 g Intravenous Q24H  . diltiazem  180 mg Oral Daily  . enoxaparin (LOVENOX) injection  40 mg Subcutaneous Daily  . folic acid  1 mg Oral Daily  . levothyroxine  75 mcg Oral QAC breakfast  . ondansetron (ZOFRAN) IV  4 mg Intravenous Q6H  . pantoprazole  40 mg Oral Daily  . potassium chloride SA  40 mEq Oral Daily  . potassium chloride  40 mEq Oral Once  . QUEtiapine  25 mg Oral QHS  . simvastatin  10 mg Oral QHS  . thiamine  100 mg Oral Once per  day on Mon Wed Fri  . venlafaxine XR  150 mg Oral Daily  . zolpidem  5 mg Oral QHS   Continuous Infusions: . sodium chloride     PRN Meds:.acetaminophen **OR** [DISCONTINUED] acetaminophen, albuterol, ALPRAZolam, gabapentin, hydrALAZINE, promethazine   PHYSICAL EXAM: Vital signs: Vitals:   03/11/17 2135 03/11/17 2136 03/12/17 0019 03/12/17 0531  BP: (!) 171/80 (!) 165/71 (!) 148/64 (!) 152/74  Pulse: 70  60 66  Resp: 18  18 16   Temp: 98.2 F (36.8 C)  98.5 F (36.9 C) 98 F (36.7  C)  TempSrc: Tympanic  Oral Oral  SpO2: 96%  95% 97%  Weight:    61.3 kg (135 lb 1.6 oz)  Height:       Filed Weights   03/10/17 1746 03/11/17 0218 03/12/17 0531  Weight: 63.5 kg (140 lb) 62 kg (136 lb 11.2 oz) 61.3 kg (135 lb 1.6 oz)   Body mass index is 25.53 kg/m.   General appearance :Awake, alert, not in any distress. Speech Clear. Not toxic Looking Eyes:, pupils equally reactive to light and accomodation,no scleral icterus.Pink conjunctiva HEENT: Atraumatic and Normocephalic Neck: supple, no JVD. No cervical lymphadenopathy. No thyromegaly Resp:Good air entry bilaterally, no added sounds  CVS: S1 S2 regular, no murmurs.  GI: Bowel sounds present, Non tender and not distended with no gaurding, rigidity or rebound.No organomegaly Extremities: B/L Lower Ext shows no edema, both legs are warm to touch Neurology:  speech clear,Non focal, sensation is grossly intact. Psychiatric: Normal judgment and insight. Alert and oriented x 3. Normal mood. Musculoskeletal:No digital cyanosis Skin:No Rash, warm and dry Wounds:N/A  I have personally reviewed following labs and imaging studies  LABORATORY DATA: CBC:  Recent Labs Lab 03/10/17 1800 03/11/17 0423 03/12/17 0300  WBC 13.4* 14.3* 11.2*  HGB 15.1* 13.7 12.3  HCT 45.5 41.0 37.8  MCV 83.9 82.3 84.0  PLT 208 254 700    Basic Metabolic Panel:  Recent Labs Lab 03/10/17 1800 03/11/17 0423 03/12/17 0300  NA 138 141 141  K 4.2 3.2* 3.5  CL 106 105 110  CO2 20* 21* 22  GLUCOSE 130* 129* 122*  BUN 11 12 17   CREATININE 0.84 0.83 0.96  CALCIUM 9.3 9.2 9.0  MG  --   --  1.7    GFR: Estimated Creatinine Clearance: 40.6 mL/min (by C-G formula based on SCr of 0.96 mg/dL).  Liver Function Tests:  Recent Labs Lab 03/10/17 1800  AST 25  ALT 15  ALKPHOS 96  BILITOT 1.3*  PROT 7.2  ALBUMIN 4.0    Recent Labs Lab 03/10/17 1800  LIPASE 11   No results for input(s): AMMONIA in the last 168 hours.  Coagulation  Profile: No results for input(s): INR, PROTIME in the last 168 hours.  Cardiac Enzymes: No results for input(s): CKTOTAL, CKMB, CKMBINDEX, TROPONINI in the last 168 hours.  BNP (last 3 results) No results for input(s): PROBNP in the last 8760 hours.  HbA1C: No results for input(s): HGBA1C in the last 72 hours.  CBG: No results for input(s): GLUCAP in the last 168 hours.  Lipid Profile: No results for input(s): CHOL, HDL, LDLCALC, TRIG, CHOLHDL, LDLDIRECT in the last 72 hours.  Thyroid Function Tests:  Recent Labs  03/11/17 0423  TSH 1.364    Anemia Panel: No results for input(s): VITAMINB12, FOLATE, FERRITIN, TIBC, IRON, RETICCTPCT in the last 72 hours.  Urine analysis:    Component Value Date/Time   COLORURINE AMBER (A) 03/10/2017 2011  APPEARANCEUR CLOUDY (A) 03/10/2017 2011   LABSPEC 1.014 03/10/2017 2011   PHURINE 5.0 03/10/2017 2011   GLUCOSEU NEGATIVE 03/10/2017 2011   HGBUR SMALL (A) 03/10/2017 2011   BILIRUBINUR NEGATIVE 03/10/2017 2011   KETONESUR NEGATIVE 03/10/2017 2011   PROTEINUR >=300 (A) 03/10/2017 2011   UROBILINOGEN 0.2 09/04/2014 1340   NITRITE NEGATIVE 03/10/2017 2011   LEUKOCYTESUR SMALL (A) 03/10/2017 2011    Sepsis Labs: Lactic Acid, Venous No results found for: LATICACIDVEN  MICROBIOLOGY: No results found for this or any previous visit (from the past 240 hour(s)).  RADIOLOGY STUDIES/RESULTS: Dg Abd 1 View  Result Date: 03/11/2017 CLINICAL DATA:  Nausea, vomiting. EXAM: ABDOMEN - 1 VIEW COMPARISON:  11/11/2016 FINDINGS: Postoperative changes in the thoracolumbar spine. No evidence of bowel obstruction. No supine evidence of free air. No organomegaly or suspicious calcification. IMPRESSION: No acute findings. Electronically Signed   By: Rolm Baptise M.D.   On: 03/11/2017 09:37   Dg Chest Port 1 View  Result Date: 03/11/2017 CLINICAL DATA:  Intractable nausea and vomiting. EXAM: PORTABLE CHEST 1 VIEW COMPARISON:  Chest radiograph  11/12/2016 FINDINGS: Low lung volumes persist. Unchanged heart size and mediastinal contours. Stable mild elevation of right hemidiaphragm. No pulmonary edema, focal airspace disease, pleural effusion or pneumothorax. Postsurgical change of the left distal clavicle and humerus. Postsurgical change in the thoracolumbar spine, partially included. The bones are under mineralized. IMPRESSION: Unchanged low lung volumes without acute abnormality. Electronically Signed   By: Jeb Levering M.D.   On: 03/11/2017 00:41     LOS: 1 day   Signature  Lala Lund K M.D on 03/12/2017 at 9:52 AM  Between 7am to 7pm - Pager - [phone] ( page via [place], text pages only, please mention full 10 digit call back number).  After 7pm go to [email or web address] - password [facility]

## 2017-03-12 NOTE — Progress Notes (Addendum)
Patient is feeling a lot better tonight, she is transferring better (seems like she's getting stronger), and has even been asking to get up by herself. We informed her that staff needs to be present for safety. Only complained of nausea once.

## 2017-03-13 LAB — BASIC METABOLIC PANEL
ANION GAP: 9 (ref 5–15)
BUN: 16 mg/dL (ref 6–20)
CALCIUM: 8.8 mg/dL — AB (ref 8.9–10.3)
CO2: 22 mmol/L (ref 22–32)
CREATININE: 1.05 mg/dL — AB (ref 0.44–1.00)
Chloride: 108 mmol/L (ref 101–111)
GFR, EST AFRICAN AMERICAN: 57 mL/min — AB (ref >60)
GFR, EST NON AFRICAN AMERICAN: 50 mL/min — AB (ref >60)
Glucose, Bld: 118 mg/dL — ABNORMAL HIGH (ref 65–99)
Potassium: 3.5 mmol/L (ref 3.5–5.1)
SODIUM: 139 mmol/L (ref 135–145)

## 2017-03-13 LAB — URINE CULTURE: Culture: >=100000 — AB

## 2017-03-13 MED ORDER — ONDANSETRON HCL 4 MG/2ML IJ SOLN: 4.0000 mg | Freq: Four times a day (QID) | INTRAMUSCULAR | Status: DC | PRN

## 2017-03-13 NOTE — Progress Notes (Signed)
Pt has had  Restful night with no complaints. IV flushed a patent.

## 2017-03-13 NOTE — Progress Notes (Signed)
PROGRESS NOTE        PATIENT DETAILS Name: [name] [name] Age: 81 y.o. Sex: female Date of Birth: [birth year] Admit Date: 03/10/2017 Admitting Physician Norval Morton, MD WUJ:[id number],NWGNFA D, MD  Brief Narrative: Patient is a 81 y.o. female with past medical history of chronic diastolic heart failure, hypertension, hypothyroidism, dyslipidemia-history of left carotid endarterectomy January 2018, paroxysmal atrial fibrillation-and not felt to be a anticoagulation for prior chart review (see discharge summary on 11/18/16) admitted for intractable vomiting for the past 2 days due to combination of UTI and possibly gastroenteritis as well.    Subjective: No new complaints reported.  Assessment/Plan:  UTI: Denies dysuria, but does acknowledge frequency. Continue Rocephin, awaiting cultures-would plan a 3 day course.  Intractable vomiting: Likely due to viral gastroenteritis, x-ray unremarkable, was placed on scheduled Zofran along with as needed Phenergan with good relief, no emesis in the last 16-18 hours, only mild nausea, will advance diet to soft, gentle hydration for another 10 hours and monitor. If stable discontinue scheduled Zofran tomorrow and switch to when necessary Zofran with further increasing her diet.  Hypokalemia: Repleted , wnl on last check.  Anxiety: Continue as needed Xanax.  Hypertension: Norvasc and diltiazem, resume atenolol at 50 mg daily and monitor.  Hypothyroidism: Continue with Synthroid, TSH stable.  Dyslipidemia: Continue Zocor.  Depression: Continue Seroquel and Effexor.  History of paroxysmal atrial fibrillation: Maintaining sinus rhythm, reviewed most recent discharge summary December 2017-continue with aspirin. Continue on combination of beta blocker and diltiazem.  Generalized weakness. Seen by PT. Recommended SNF, social work informed to arrange.   DVT Prophylaxis: Prophylactic Lovenox   Code Status: Full code    Family Communication: None at bedside  Disposition Plan: SNF  Antimicrobial agents: Anti-infectives    Start     Dose/Rate Route Frequency Ordered Stop   03/11/17 2200  cefTRIAXone (ROCEPHIN) 1 g in dextrose 5 % 50 mL IVPB     1 g 100 mL/hr over 30 Minutes Intravenous Every 24 hours 03/11/17 0038     03/11/17 0030  cefTRIAXone (ROCEPHIN) 1 g in dextrose 5 % 50 mL IVPB     1 g 100 mL/hr over 30 Minutes Intravenous STAT 03/11/17 0023 03/11/17 0142      Procedures: None  CONSULTS:  None  Time spent: 36 minutes-Greater than 50% of this time was spent in counseling, explanation of diagnosis, planning of further management, and coordination of care.  MEDICATIONS: Scheduled Meds: . amLODipine  2.5 mg Oral Daily  . aspirin EC  81 mg Oral Daily  . atenolol  50 mg Oral Daily  . cefTRIAXone (ROCEPHIN)  IV  1 g Intravenous Q24H  . diltiazem  180 mg Oral Daily  . enoxaparin (LOVENOX) injection  40 mg Subcutaneous Daily  . folic acid  1 mg Oral Daily  . levothyroxine  75 mcg Oral QAC breakfast  . pantoprazole  40 mg Oral Daily  . potassium chloride SA  40 mEq Oral Daily  . QUEtiapine  25 mg Oral QHS  . simvastatin  10 mg Oral QHS  . thiamine  100 mg Oral Once per day on Mon Wed Fri  . venlafaxine XR  150 mg Oral Daily  . zolpidem  5 mg Oral QHS   Continuous Infusions:  PRN Meds:.acetaminophen **OR** [DISCONTINUED] acetaminophen, albuterol, ALPRAZolam, gabapentin, hydrALAZINE, ondansetron (ZOFRAN) IV, promethazine  PHYSICAL EXAM: Vital signs: Vitals:   03/13/17 0035 03/13/17 0300 03/13/17 0620 03/13/17 1007  BP: 139/67  (!) 196/74 (!) 151/58  Pulse: 69  65 87  Resp: 18  18 18   Temp: 98.2 F (36.8 C)  98.3 F (36.8 C)   TempSrc: Oral  Oral   SpO2: 97%  98% (!) 84%  Weight:  63 kg (139 lb)    Height:       Filed Weights   03/11/17 0218 03/12/17 0531 03/13/17 0300  Weight: 62 kg (136 lb 11.2 oz) 61.3 kg (135 lb 1.6 oz) 63 kg (139 lb)   Body mass index is 26.26  kg/m.   General appearance :Awake, alert, not in any distress. Speech Clear. Not toxic Looking Eyes:, pupils equally reactive to light and accomodation,no scleral icterus.Pink conjunctiva HEENT: Atraumatic and Normocephalic Neck: supple, no JVD. No cervical lymphadenopathy. No thyromegaly Resp: equal chest rise, no wheezes, no rhales CVS: S1 S2 regular, no murmurs.  GI: Bowel sounds present, Non tender and not distended with no gaurding, rigidity or rebound.No organomegaly Extremities: B/L Lower Ext shows no edema, both legs are warm to touch Neurology:  speech clear,Non focal, sensation is grossly intact. Psychiatric: Normal judgment and insight. Alert and oriented x 3. Normal mood. Musculoskeletal:No digital cyanosis Skin:No Rash, warm and dry Wounds:N/A  I have personally reviewed following labs and imaging studies  LABORATORY DATA: CBC:  Recent Labs Lab 03/10/17 1800 03/11/17 0423 03/12/17 0300  WBC 13.4* 14.3* 11.2*  HGB 15.1* 13.7 12.3  HCT 45.5 41.0 37.8  MCV 83.9 82.3 84.0  PLT 208 254 323    Basic Metabolic Panel:  Recent Labs Lab 03/10/17 1800 03/11/17 0423 03/12/17 0300 03/13/17 0421  NA 138 141 141 139  K 4.2 3.2* 3.5 3.5  CL 106 105 110 108  CO2 20* 21* 22 22  GLUCOSE 130* 129* 122* 118*  BUN 11 12 17 16   CREATININE 0.84 0.83 0.96 1.05*  CALCIUM 9.3 9.2 9.0 8.8*  MG  --   --  1.7  --     GFR: Estimated Creatinine Clearance: 37.6 mL/min (A) (by C-G formula based on SCr of 1.05 mg/dL (H)).  Liver Function Tests:  Recent Labs Lab 03/10/17 1800  AST 25  ALT 15  ALKPHOS 96  BILITOT 1.3*  PROT 7.2  ALBUMIN 4.0    Recent Labs Lab 03/10/17 1800  LIPASE 11   No results for input(s): AMMONIA in the last 168 hours.  Coagulation Profile: No results for input(s): INR, PROTIME in the last 168 hours.  Cardiac Enzymes: No results for input(s): CKTOTAL, CKMB, CKMBINDEX, TROPONINI in the last 168 hours.  BNP (last 3 results) No results for  input(s): PROBNP in the last 8760 hours.  HbA1C: No results for input(s): HGBA1C in the last 72 hours.  CBG: No results for input(s): GLUCAP in the last 168 hours.  Lipid Profile: No results for input(s): CHOL, HDL, LDLCALC, TRIG, CHOLHDL, LDLDIRECT in the last 72 hours.  Thyroid Function Tests:  Recent Labs  03/11/17 0423  TSH 1.364    Anemia Panel: No results for input(s): VITAMINB12, FOLATE, FERRITIN, TIBC, IRON, RETICCTPCT in the last 72 hours.  Urine analysis:    Component Value Date/Time   COLORURINE AMBER (A) 03/10/2017 2011   APPEARANCEUR CLOUDY (A) 03/10/2017 2011   LABSPEC 1.014 03/10/2017 2011   PHURINE 5.0 03/10/2017 2011   GLUCOSEU NEGATIVE 03/10/2017 2011   HGBUR SMALL (A) 03/10/2017 2011   [place] NEGATIVE 03/10/2017 2011  [place] NEGATIVE 03/10/2017 2011   PROTEINUR >=300 (A) 03/10/2017 2011   UROBILINOGEN 0.2 09/04/2014 1340   NITRITE NEGATIVE 03/10/2017 2011   LEUKOCYTESUR SMALL (A) 03/10/2017 2011    Sepsis Labs: Lactic Acid, Venous No results found for: LATICACIDVEN  MICROBIOLOGY: Recent Results (from the past 240 hour(s))  Culture, Urine     Status: Abnormal   Collection Time: 03/10/17  8:11 PM  Result Value Ref Range Status   Specimen Description URINE, RANDOM  Final   Special Requests NONE  Final   Culture (A)  Final    >=100,000 COLONIES/mL KLEBSIELLA PNEUMONIAE >=100,000 COLONIES/mL ESCHERICHIA COLI    Report Status 03/13/2017 FINAL  Final   Organism ID, Bacteria KLEBSIELLA PNEUMONIAE (A)  Final   Organism ID, Bacteria ESCHERICHIA COLI (A)  Final      Susceptibility   Escherichia coli - MIC*    AMPICILLIN 4 SENSITIVE Sensitive     CEFAZOLIN <=4 SENSITIVE Sensitive     CEFTRIAXONE <=1 SENSITIVE Sensitive     CIPROFLOXACIN <=0.25 SENSITIVE Sensitive     GENTAMICIN <=1 SENSITIVE Sensitive     IMIPENEM <=0.25 SENSITIVE Sensitive     NITROFURANTOIN <=16 SENSITIVE Sensitive     TRIMETH/SULFA <=20 SENSITIVE Sensitive      AMPICILLIN/SULBACTAM <=2 SENSITIVE Sensitive     PIP/TAZO <=4 SENSITIVE Sensitive     Extended ESBL NEGATIVE Sensitive     * >=100,000 COLONIES/mL ESCHERICHIA COLI   Klebsiella pneumoniae - MIC*    AMPICILLIN RESISTANT Resistant     CEFAZOLIN <=4 SENSITIVE Sensitive     CEFTRIAXONE <=1 SENSITIVE Sensitive     CIPROFLOXACIN <=0.25 SENSITIVE Sensitive     GENTAMICIN <=1 SENSITIVE Sensitive     IMIPENEM 0.5 SENSITIVE Sensitive     NITROFURANTOIN 32 SENSITIVE Sensitive     TRIMETH/SULFA <=20 SENSITIVE Sensitive     AMPICILLIN/SULBACTAM 4 SENSITIVE Sensitive     PIP/TAZO <=4 SENSITIVE Sensitive     Extended ESBL NEGATIVE Sensitive     * >=100,000 COLONIES/mL KLEBSIELLA PNEUMONIAE    RADIOLOGY STUDIES/RESULTS: Dg Abd 1 View  Result Date: 03/11/2017 CLINICAL DATA:  Nausea, vomiting. EXAM: ABDOMEN - 1 VIEW COMPARISON:  11/11/2016 FINDINGS: Postoperative changes in the thoracolumbar spine. No evidence of bowel obstruction. No supine evidence of free air. No organomegaly or suspicious calcification. IMPRESSION: No acute findings. Electronically Signed   By: Rolm Baptise M.D.   On: 03/11/2017 09:37   Dg Chest Port 1 View  Result Date: 03/11/2017 CLINICAL DATA:  Intractable nausea and vomiting. EXAM: PORTABLE CHEST 1 VIEW COMPARISON:  Chest radiograph 11/12/2016 FINDINGS: Low lung volumes persist. Unchanged heart size and mediastinal contours. Stable mild elevation of right hemidiaphragm. No pulmonary edema, focal airspace disease, pleural effusion or pneumothorax. Postsurgical change of the left distal clavicle and humerus. Postsurgical change in the thoracolumbar spine, partially included. The bones are under mineralized. IMPRESSION: Unchanged low lung volumes without acute abnormality. Electronically Signed   By: Jeb Levering M.D.   On: 03/11/2017 00:41     LOS: 2 days   Signature  Velvet Bathe M.D on 03/13/2017 at 2:11 PM  Between 7am to 7pm - Pager - [phone] ( page via [facility], text  pages only, please mention full 10 digit call back number).  After 7pm go to [email or web address] - password [facility]

## 2017-03-13 NOTE — Progress Notes (Signed)
Pt is experiencing fine tremors and flushed in face. With blood pressure 175/62 pulse 76 a febrile.

## 2017-03-13 NOTE — Progress Notes (Signed)
qPhysical Therapy Treatment Patient Details Name: [name] [name] MRN: [id number] DOB: Jul 26, 1936 Today's Date: 03/13/2017    History of Present Illness Pt is a 81 y.o. female admitted to ED on 03/10/17 secondary to 3-day history of nausea and recent fall; suspect due to viral syndrome. Pertinent PMH includes CHF, HTN, psoriatic arthritis, back pain, GERD, CVA, depression, PVD, a-fib, seizures.     PT Comments    Pt with much improved mobility and no dizziness with ambulation today. If husband able to provide needed assist pt likely can go home instead of SNF. If husband unable to provide will still need SNF.   Follow Up Recommendations  Supervision for mobility/OOB;Home health PT (if husband able to provide 24 hour assist)     Equipment Recommendations  None recommended by PT    Recommendations for Other Services       Precautions / Restrictions Precautions Precautions: Fall Restrictions Weight Bearing Restrictions: No    Mobility  Bed Mobility               General bed mobility comments: Pt sitting in chair  Transfers Overall transfer level: Needs assistance Equipment used: Rolling walker (2 wheeled) Transfers: Sit to/from Stand Sit to Stand: Min guard         General transfer comment: Assist for safety  Ambulation/Gait Ambulation/Gait assistance: Min guard Ambulation Distance (Feet): 150 Feet Assistive device: Rolling walker (2 wheeled) Gait Pattern/deviations: Step-to pattern;Decreased step length - right;Decreased step length - left Gait velocity: decr Gait velocity interpretation: Below normal speed for age/gender General Gait Details: Assist for safety. Pt denies any dizziness.   Stairs            Wheelchair Mobility    Modified Rankin (Stroke Patients Only)       Balance Overall balance assessment: Needs assistance Sitting-balance support: No upper extremity supported;Feet supported Sitting balance-Leahy Scale: Fair     Standing  balance support: Bilateral upper extremity supported;During functional activity Standing balance-Leahy Scale: Poor Standing balance comment: walker and supervision for static standing                            Cognition Arousal/Alertness: Awake/alert Behavior During Therapy: WFL for tasks assessed/performed Overall Cognitive Status: Within Functional Limits for tasks assessed                                        Exercises      General Comments        Pertinent Vitals/Pain Pain Assessment: No/denies pain    Home Living                      Prior Function            PT Goals (current goals can now be found in the care plan section) Progress towards PT goals: Progressing toward goals    Frequency    Min 3X/week      PT Plan Frequency needs to be updated;Discharge plan needs to be updated    Co-evaluation             End of Session Equipment Utilized During Treatment: Gait belt Activity Tolerance: Patient tolerated treatment well Patient left: in chair;with chair alarm set;with call bell/phone within reach Nurse Communication: Mobility status PT Visit Diagnosis: Muscle weakness (generalized) (M62.81);Unsteadiness on feet (R26.81)  Time: 1209-1224 PT Time Calculation (min) (ACUTE ONLY): 15 min  Charges:  $Gait Training: 8-22 mins                    G Codes:       [facility] PT [place] 03/13/2017, 1:29 PM

## 2017-03-14 MED ORDER — AMLODIPINE BESYLATE 5 MG PO TABS
5.0000 mg | ORAL_TABLET | Freq: Every day | ORAL | Status: DC
  Administered 2017-03-14 – 2017-03-15 (×2): 5 mg via ORAL
  Filled 2017-03-14 (×2): qty 1

## 2017-03-14 NOTE — Progress Notes (Signed)
Patient stable throughout the night, was asking for antianxiety medicine to help her rest.

## 2017-03-14 NOTE — Progress Notes (Signed)
qPhysical Therapy Treatment Patient Details Name: [name] [name] MRN: [id number] DOB: [birth year] Today's Date: 03/14/2017    History of Present Illness Pt is a 81 y.o. female admitted to ED on 03/10/17 secondary to 3-day history of nausea and recent fall; suspect due to viral syndrome. Pertinent PMH includes CHF, HTN, psoriatic arthritis, back pain, GERD, CVA, depression, PVD, a-fib, seizures.     PT Comments    Pt progressing well toward goals with increase ambulation distance. Pt confirmed husband will be able to provide 24 hour assist/supervision. Recommend d/c home with HHPT for safe transition home, when medically ready. PT will continue to follow.   Follow Up Recommendations  Home health PT;Supervision for mobility/OOB     Equipment Recommendations  None recommended by PT    Recommendations for Other Services       Precautions / Restrictions Precautions Precautions: Fall Restrictions Weight Bearing Restrictions: No    Mobility  Bed Mobility               General bed mobility comments: seated in chair upon PT arrival  Transfers Overall transfer level: Needs assistance Equipment used: Rolling walker (2 wheeled) Transfers: Sit to/from Stand Sit to Stand: Min guard         General transfer comment: min guard for safety. no reports of dizziness. sit<>stand x 2 with transfer to bathroom. verbal cues for hand placement  Ambulation/Gait Ambulation/Gait assistance: Min guard Ambulation Distance (Feet): 300 Feet Assistive device: Rolling walker (2 wheeled) Gait Pattern/deviations: Step-through pattern;Decreased stride length;Trunk flexed Gait velocity: decreased Gait velocity interpretation: Below normal speed for age/gender General Gait Details: min guard for safety. verbal cues for upright posture. denies dizziness.    Stairs            Wheelchair Mobility    Modified Rankin (Stroke Patients Only)       Balance Overall balance assessment: Needs  assistance Sitting-balance support: No upper extremity supported;Feet supported Sitting balance-Leahy Scale: Fair     Standing balance support: No upper extremity supported;During functional activity Standing balance-Leahy Scale: Fair Standing balance comment: able to perform toilet hygeine with supervision without LOB or UE assist                            Cognition Arousal/Alertness: Awake/alert Behavior During Therapy: WFL for tasks assessed/performed Overall Cognitive Status: Within Functional Limits for tasks assessed                                 General Comments: Pt reports that she has been having trouble with confusion for several days, however, was A&O x 4 and does not exhibit any cognitive limitations during session      Exercises      General Comments General comments (skin integrity, edema, etc.): transferred to toilet in bathroom, pt able to perform toilet hygeine and hand hygeine safely with supervision      Pertinent Vitals/Pain Pain Assessment: No/denies pain    Home Living                      Prior Function            PT Goals (current goals can now be found in the care plan section) Acute Rehab PT Goals Patient Stated Goal: Go home with husband Progress towards PT goals: Progressing toward goals    Frequency  Min 3X/week      PT Plan Current plan remains appropriate    Co-evaluation             End of Session Equipment Utilized During Treatment: Gait belt Activity Tolerance: Patient tolerated treatment well Patient left: in chair;with call bell/phone within reach;with chair alarm set Nurse Communication: Mobility status PT Visit Diagnosis: Unsteadiness on feet (R26.81);Muscle weakness (generalized) (M62.81)     Time: 8185-9093 PT Time Calculation (min) (ACUTE ONLY): 24 min  Charges:  $Gait Training: 8-22 mins $Therapeutic Activity: 8-22 mins                    G Codes:       [employer], SPT Acute Rehab SPT [place] 03/14/2017, 10:45 AM

## 2017-03-14 NOTE — Progress Notes (Addendum)
PROGRESS NOTE        PATIENT DETAILS Name: [name] [name] Age: 81 y.o. Sex: female Date of Birth: Sep 21, 1936 Admit Date: 03/10/2017 Admitting Physician Norval Morton, MD LOV:FIEPPI,RJJOAC D, MD  Brief Narrative: Patient is a 81 y.o. female with past medical history of chronic diastolic heart failure, hypertension, hypothyroidism, dyslipidemia-history of left carotid endarterectomy January 2018, paroxysmal atrial fibrillation-and not felt to be a anticoagulation for prior chart review (see discharge summary on 11/18/16) admitted for intractable vomiting for the past 2 days due to combination of UTI and possibly gastroenteritis as well.   Nausea and emesis resolving but hospital stay complicated by uncontrolled HTN  Subjective: No new complaints reported.  Assessment/Plan:  UTI: Denies dysuria, but does acknowledge frequency. Continue Rocephin  Addendum: U/C: growing Klebsiella and E coli both sensitive to Rocephin. Will administer another dose to complete a 5 day treatment regimen. May consider prolonging antibiotic regimen will defer to pcp caring for patient next am.  Intractable vomiting: Likely due to viral gastroenteritis, x-ray unremarkable, was placed on scheduled Zofran along with as needed Phenergan with good relief, no emesis in the last 16-18 hours, only mild nausea, will advance diet to soft, gentle hydration for another 10 hours and monitor. If stable discontinue scheduled Zofran tomorrow and switch to when necessary Zofran with further increasing her diet.  Hypokalemia: Repleted , wnl on last check.  Anxiety: Continue as needed Xanax.  Hypertension: Norvasc and diltiazem, resume atenolol at 50 mg daily and monitor. - Currently not well controlled will increase Norvasc dose and reassess through the course of the next 24 hours  Hypothyroidism: Continue with Synthroid, TSH stable.  Dyslipidemia: Continue Zocor.  Depression: Continue Seroquel and  Effexor.  History of paroxysmal atrial fibrillation: Maintaining sinus rhythm, reviewed most recent discharge summary December 2017-continue with aspirin. Continue on combination of beta blocker and diltiazem.  Generalized weakness. Seen by PT. Recommended HH PT   DVT Prophylaxis: Prophylactic Lovenox   Code Status: Full code   Family Communication: None at bedside  Disposition Plan: [place] [facility]. D/c with improvement in blood pressures  Antimicrobial agents: Anti-infectives    Start     Dose/Rate Route Frequency Ordered Stop   03/11/17 2200  cefTRIAXone (ROCEPHIN) 1 g in dextrose 5 % 50 mL IVPB     1 g 100 mL/hr over 30 Minutes Intravenous Every 24 hours 03/11/17 0038     03/11/17 0030  cefTRIAXone (ROCEPHIN) 1 g in dextrose 5 % 50 mL IVPB     1 g 100 mL/hr over 30 Minutes Intravenous STAT 03/11/17 0023 03/11/17 0142      Procedures: None  CONSULTS:  None  Time spent: 36 minutes-Greater than 50% of this time was spent in counseling, explanation of diagnosis, planning of further management, and coordination of care.  MEDICATIONS: Scheduled Meds: . amLODipine  5 mg Oral Daily  . aspirin EC  81 mg Oral Daily  . atenolol  50 mg Oral Daily  . cefTRIAXone (ROCEPHIN)  IV  1 g Intravenous Q24H  . diltiazem  180 mg Oral Daily  . enoxaparin (LOVENOX) injection  40 mg Subcutaneous Daily  . folic acid  1 mg Oral Daily  . levothyroxine  75 mcg Oral QAC breakfast  . pantoprazole  40 mg Oral Daily  . potassium chloride SA  40 mEq Oral Daily  . QUEtiapine  25 mg Oral QHS  . simvastatin  10 mg Oral QHS  . thiamine  100 mg Oral Once per day on Mon Wed Fri  . venlafaxine XR  150 mg Oral Daily  . zolpidem  5 mg Oral QHS   Continuous Infusions:  PRN Meds:.acetaminophen **OR** [DISCONTINUED] acetaminophen, albuterol, ALPRAZolam, gabapentin, hydrALAZINE, ondansetron (ZOFRAN) IV, promethazine   PHYSICAL EXAM: Vital signs: Vitals:   03/13/17 1400 03/13/17 2042 03/14/17 0500  03/14/17 1251  BP: (!) 191/48 (!) 181/60 (!) 172/40 (!) 190/64  Pulse: (!) 59 61 67 (!) 59  Resp: 18 18 18 18   Temp: 98.7 F (37.1 C) 98.4 F (36.9 C) 98.2 F (36.8 C) 98 F (36.7 C)  TempSrc: Oral Oral Oral Oral  SpO2: 95% 99% 98% 98%  Weight:   63 kg (138 lb 14.4 oz)   Height:       Filed Weights   03/12/17 0531 03/13/17 0300 03/14/17 0500  Weight: 61.3 kg (135 lb 1.6 oz) 63 kg (139 lb) 63 kg (138 lb 14.4 oz)   Body mass index is 26.24 kg/m.   General appearance :Awake, alert, not in any distress. Speech Clear. Not toxic Looking Eyes:, pupils equally reactive to light and accomodation,no scleral icterus.Pink conjunctiva HEENT: Atraumatic and Normocephalic Neck: supple, no JVD. No cervical lymphadenopathy. No thyromegaly Resp: equal chest rise, no wheezes, no rhales CVS: S1 S2 regular, no murmurs.  GI: Bowel sounds present, Non tender and not distended with no gaurding, rigidity or rebound.No organomegaly Extremities: B/L Lower Ext shows no edema, both legs are warm to touch Neurology:  speech clear,Non focal, sensation is grossly intact. Psychiatric: Normal judgment and insight. Alert and oriented x 3. Normal mood. Musculoskeletal:No digital cyanosis Skin:No Rash, warm and dry Wounds:N/A  I have personally reviewed following labs and imaging studies  LABORATORY DATA: CBC:  Recent Labs Lab 03/10/17 1800 03/11/17 0423 03/12/17 0300  WBC 13.4* 14.3* 11.2*  HGB 15.1* 13.7 12.3  HCT 45.5 41.0 37.8  MCV 83.9 82.3 84.0  PLT 208 254 656    Basic Metabolic Panel:  Recent Labs Lab 03/10/17 1800 03/11/17 0423 03/12/17 0300 03/13/17 0421  NA 138 141 141 139  K 4.2 3.2* 3.5 3.5  CL 106 105 110 108  CO2 20* 21* 22 22  GLUCOSE 130* 129* 122* 118*  BUN 11 12 17 16   CREATININE 0.84 0.83 0.96 1.05*  CALCIUM 9.3 9.2 9.0 8.8*  MG  --   --  1.7  --     GFR: Estimated Creatinine Clearance: 37.6 mL/min (A) (by C-G formula based on SCr of 1.05 mg/dL (H)).  Liver  Function Tests:  Recent Labs Lab 03/10/17 1800  AST 25  ALT 15  ALKPHOS 96  BILITOT 1.3*  PROT 7.2  ALBUMIN 4.0    Recent Labs Lab 03/10/17 1800  LIPASE 11   No results for input(s): AMMONIA in the last 168 hours.  Coagulation Profile: No results for input(s): INR, PROTIME in the last 168 hours.  Cardiac Enzymes: No results for input(s): CKTOTAL, CKMB, CKMBINDEX, TROPONINI in the last 168 hours.  BNP (last 3 results) No results for input(s): PROBNP in the last 8760 hours.  HbA1C: No results for input(s): HGBA1C in the last 72 hours.  CBG: No results for input(s): GLUCAP in the last 168 hours.  Lipid Profile: No results for input(s): CHOL, HDL, LDLCALC, TRIG, CHOLHDL, LDLDIRECT in the last 72 hours.  Thyroid Function Tests: No results for input(s): TSH, T4TOTAL, FREET4, T3FREE, THYROIDAB  in the last 72 hours.  Anemia Panel: No results for input(s): VITAMINB12, FOLATE, FERRITIN, TIBC, IRON, RETICCTPCT in the last 72 hours.  Urine analysis:    Component Value Date/Time   COLORURINE AMBER (A) 03/10/2017 2011   APPEARANCEUR CLOUDY (A) 03/10/2017 2011   LABSPEC 1.014 03/10/2017 2011   PHURINE 5.0 03/10/2017 2011   GLUCOSEU NEGATIVE 03/10/2017 2011   HGBUR SMALL (A) 03/10/2017 2011   BILIRUBINUR NEGATIVE 03/10/2017 2011   [place] 03/10/2017 2011   PROTEINUR >=300 (A) 03/10/2017 2011   UROBILINOGEN 0.2 09/04/2014 1340   NITRITE NEGATIVE 03/10/2017 2011   LEUKOCYTESUR SMALL (A) 03/10/2017 2011    Sepsis Labs: Lactic Acid, Venous No results found for: LATICACIDVEN  MICROBIOLOGY: Recent Results (from the past 240 hour(s))  Culture, Urine     Status: Abnormal   Collection Time: 03/10/17  8:11 PM  Result Value Ref Range Status   Specimen Description URINE, RANDOM  Final   Special Requests NONE  Final   Culture (A)  Final    >=100,000 COLONIES/mL KLEBSIELLA PNEUMONIAE >=100,000 COLONIES/mL ESCHERICHIA COLI    Report Status 03/13/2017 FINAL   Final   Organism ID, Bacteria KLEBSIELLA PNEUMONIAE (A)  Final   Organism ID, Bacteria ESCHERICHIA COLI (A)  Final      Susceptibility   Escherichia coli - MIC*    AMPICILLIN 4 SENSITIVE Sensitive     CEFAZOLIN <=4 SENSITIVE Sensitive     CEFTRIAXONE <=1 SENSITIVE Sensitive     CIPROFLOXACIN <=0.25 SENSITIVE Sensitive     GENTAMICIN <=1 SENSITIVE Sensitive     IMIPENEM <=0.25 SENSITIVE Sensitive     NITROFURANTOIN <=16 SENSITIVE Sensitive     TRIMETH/SULFA <=20 SENSITIVE Sensitive     AMPICILLIN/SULBACTAM <=2 SENSITIVE Sensitive     PIP/TAZO <=4 SENSITIVE Sensitive     Extended ESBL NEGATIVE Sensitive     * >=100,000 COLONIES/mL ESCHERICHIA COLI   Klebsiella pneumoniae - MIC*    AMPICILLIN RESISTANT Resistant     CEFAZOLIN <=4 SENSITIVE Sensitive     CEFTRIAXONE <=1 SENSITIVE Sensitive     CIPROFLOXACIN <=0.25 SENSITIVE Sensitive     GENTAMICIN <=1 SENSITIVE Sensitive     IMIPENEM 0.5 SENSITIVE Sensitive     NITROFURANTOIN 32 SENSITIVE Sensitive     TRIMETH/SULFA <=20 SENSITIVE Sensitive     AMPICILLIN/SULBACTAM 4 SENSITIVE Sensitive     PIP/TAZO <=4 SENSITIVE Sensitive     Extended ESBL NEGATIVE Sensitive     * >=100,000 COLONIES/mL KLEBSIELLA PNEUMONIAE    RADIOLOGY STUDIES/RESULTS: Dg Abd 1 View  Result Date: 03/11/2017 CLINICAL DATA:  Nausea, vomiting. EXAM: ABDOMEN - 1 VIEW COMPARISON:  11/11/2016 FINDINGS: Postoperative changes in the thoracolumbar spine. No evidence of bowel obstruction. No supine evidence of free air. No organomegaly or suspicious calcification. IMPRESSION: No acute findings. Electronically Signed   By: Rolm Baptise M.D.   On: 03/11/2017 09:37   Dg Chest Port 1 View  Result Date: 03/11/2017 CLINICAL DATA:  Intractable nausea and vomiting. EXAM: PORTABLE CHEST 1 VIEW COMPARISON:  Chest radiograph 11/12/2016 FINDINGS: Low lung volumes persist. Unchanged heart size and mediastinal contours. Stable mild elevation of right hemidiaphragm. No pulmonary edema,  focal airspace disease, pleural effusion or pneumothorax. Postsurgical change of the left distal clavicle and humerus. Postsurgical change in the thoracolumbar spine, partially included. The bones are under mineralized. IMPRESSION: Unchanged low lung volumes without acute abnormality. Electronically Signed   By: Jeb Levering M.D.   On: 03/11/2017 00:41     LOS: 3 days  Jorje Guild M.D on 03/14/2017 at 1:50 PM  Between 7am to 7pm - Pager - [phone] ( page via [facility], text pages only, please mention full 10 digit call back number).  After 7pm go to [email or web address] - password [facility]

## 2017-03-15 MED ORDER — AMLODIPINE BESYLATE 5 MG PO TABS: 5.0000 mg | ORAL_TABLET | Freq: Every day | ORAL | 0 refills | Status: DC

## 2017-03-15 MED ORDER — CEPHALEXIN 500 MG PO CAPS: 500.0000 mg | ORAL_CAPSULE | Freq: Two times a day (BID) | ORAL | 0 refills | Status: AC

## 2017-03-15 MED ORDER — ATENOLOL 50 MG PO TABS: 50.0000 mg | ORAL_TABLET | Freq: Every day | ORAL | 0 refills | Status: DC

## 2017-03-15 NOTE — Progress Notes (Signed)
CM talked to patient to offer [facility] choices. Pt chose [facility]; Butch Penny with [facility] called for arrangements. Mindi Slicker [facility] [phone]

## 2017-03-15 NOTE — Clinical Social Work Note (Signed)
CSW acknowledges SNF consult. PT recommending HHPT.  CSW signing off. Consult again if any social work needs arise.  Alberto Pina, CSW [phone]  

## 2017-03-15 NOTE — Care Management Important Message (Signed)
Important Message  Patient Details  Name: [name] [name] MRN: [id number] Date of Birth: 1936/10/21   Medicare Important Message Given:  Yes    [name] 03/15/2017, 2:16 PM

## 2017-03-15 NOTE — Progress Notes (Signed)
qPhysical Therapy Treatment Patient Details Name: BRITTNE KAWASAKI MRN: [id number] DOB: 01-28-1936 Today's Date: 03/15/2017    History of Present Illness Pt is a 81 y.o. female admitted to ED on 03/10/17 secondary to 3-day history of nausea and recent fall; suspect due to viral syndrome. Pertinent PMH includes CHF, HTN, psoriatic arthritis, back pain, GERD, CVA, depression, PVD, a-fib, seizures.     PT Comments    Pt making excellent progress with mobility. Ready for DC home from PT standpoint.   Follow Up Recommendations  Home health PT;Supervision - Intermittent     Equipment Recommendations  None recommended by PT    Recommendations for Other Services       Precautions / Restrictions Precautions Precautions: Fall Restrictions Weight Bearing Restrictions: No    Mobility  Bed Mobility               General bed mobility comments: Up in chair  Transfers Overall transfer level: Modified independent Equipment used: Rolling walker (2 wheeled) Transfers: Sit to/from Stand Sit to Stand: Modified independent (Device/Increase time)         General transfer comment: Good hand placement and safe transfer  Ambulation/Gait Ambulation/Gait assistance: Supervision Ambulation Distance (Feet): 400 Feet Assistive device: Rolling walker (2 wheeled) Gait Pattern/deviations: Step-through pattern;Decreased stride length;Trunk flexed Gait velocity: decreased Gait velocity interpretation: Below normal speed for age/gender General Gait Details: Steady gait with walker   Stairs            Wheelchair Mobility    Modified Rankin (Stroke Patients Only)       Balance Overall balance assessment: Needs assistance Sitting-balance support: No upper extremity supported;Feet supported Sitting balance-Leahy Scale: Good     Standing balance support: No upper extremity supported;During functional activity Standing balance-Leahy Scale: Fair                               Cognition Arousal/Alertness: Awake/alert Behavior During Therapy: WFL for tasks assessed/performed Overall Cognitive Status: Within Functional Limits for tasks assessed                                        Exercises      General Comments        Pertinent Vitals/Pain      Home Living                      Prior Function            PT Goals (current goals can now be found in the care plan section) Progress towards PT goals: Progressing toward goals    Frequency    Min 3X/week      PT Plan Current plan remains appropriate    Co-evaluation             End of Session   Activity Tolerance: Patient tolerated treatment well Patient left: in chair;with call bell/phone within reach Nurse Communication: Mobility status PT Visit Diagnosis: Muscle weakness (generalized) (M62.81)     Time: 1191-4782 PT Time Calculation (min) (ACUTE ONLY): 12 min  Charges:  $Gait Training: 8-22 mins                    G Codes:       [facility] [place] 03/15/2017, 12:44 PM

## 2017-03-15 NOTE — Progress Notes (Addendum)
Patient ambulating well, more steady on her feet. As been complaining of not getting much rest, although she states that she slept better last night. Stable throughout the night, no nausea at all, asking about going home.

## 2017-03-15 NOTE — Discharge Summary (Signed)
Physician Discharge Summary  [name] [name] XBD:[id number] DOB: 03/08/1936 DOA: 03/10/2017  PCP: Kandice Hams, MD  Admit date: 03/10/2017 Discharge date: 03/15/2017  Time spent: > 35 minutes  Recommendations for Outpatient Follow-up:  1. Monitor blood pressures and adjust antihypertensives  accordingly   Discharge Diagnoses:  Principal Problem:   Intractable nausea and vomiting Active Problems:   Anxiety   Leukocytosis   Hypothyroidism   Hypertensive urgency   Acute lower UTI   Discharge Condition: stable  Diet recommendation: Heart healthy  Filed Weights   03/13/17 0300 03/14/17 0500 03/15/17 0304  Weight: 63 kg (139 lb) 63 kg (138 lb 14.4 oz) 63 kg (138 lb 14.4 oz)    History of present illness:   81 y.o. female with medical history significant of chronic diastolic CHF, PAF, psoriatic arthritis, back pain, GERD, and hypothyroidism; who presents with complaints of vomiting starting today. Patient reports that she's actually been nauseous, however for the last 3 days. Patient reports acute onset of nonbilious emesis with inability to keep any fluids or liquids down  Hospital Course:  UTI: U/C: growing Klebsiella and E coli both sensitive to Rocephin. Patient received 5 days of IV antibiotics. Will discharge on 2 more days of oral antibiotics  Intractable vomiting: Likely due to viral gastroenteritis or UTI. Improved on antibiotics and currently resolved.  Hypokalemia: Repleted , wnl on last check.  Anxiety: Continue as needed Xanax.  Hypertension: continue regimen listed below  Hypothyroidism: Continue with Synthroid, TSH stable.  Dyslipidemia: Continue Zocor.  Depression: Continue Seroquel and Effexor.  History of paroxysmal atrial fibrillation: Continue on combination of beta blocker and diltiazem.  Generalized weakness. Seen by PT. Recommended HH PT orders placed   Procedures:  None  Consultations:  None  Discharge Exam: Vitals:   03/15/17  0304 03/15/17 1215  BP: (!) 151/86 (!) 152/61  Pulse: 74 (!) 58  Resp:  18  Temp: 98 F (36.7 C) 97.8 F (36.6 C)    General: Pt has no new complaints Cardiovascular: RRR, no rubs Respiratory: no increased wob, no wheezes  Discharge Instructions   Discharge Instructions    Call MD for:  severe uncontrolled pain    Complete by:  As directed    Call MD for:  temperature >100.4    Complete by:  As directed    Diet - low sodium heart healthy    Complete by:  As directed    Discharge instructions    Complete by:  As directed    Please be sure to follow up with your primary care physician in 1-2 weeks or sooner should any new concerns arise.   Increase activity slowly    Complete by:  As directed      Current Discharge Medication List    START taking these medications   Details  cephALEXin (KEFLEX) 500 MG capsule Take 1 capsule (500 mg total) by mouth 2 (two) times daily. Qty: 4 capsule, Refills: 0      CONTINUE these medications which have CHANGED   Details  amLODipine (NORVASC) 5 MG tablet Take 1 tablet (5 mg total) by mouth daily. Qty: 30 tablet, Refills: 0    atenolol (TENORMIN) 50 MG tablet Take 1 tablet (50 mg total) by mouth daily. Qty: 30 tablet, Refills: 0      CONTINUE these medications which have NOT CHANGED   Details  acetaminophen (TYLENOL 8 HOUR ARTHRITIS PAIN) 650 MG CR tablet Take 1,300 mg by mouth every 8 (eight) hours as needed for  pain.     ALPRAZolam (XANAX) 0.5 MG tablet Take 0.5 mg by mouth daily as needed for anxiety.    esomeprazole (NEXIUM) 40 MG capsule Take 40 mg by mouth daily.    folic acid (FOLVITE) 1 MG tablet Take 1 tablet (1 mg total) by mouth daily. Qty: 30 tablet, Refills: 0    furosemide (LASIX) 20 MG tablet Take 20 mg by mouth daily with breakfast.     gabapentin (NEURONTIN) 400 MG capsule Take 400 mg by mouth 3 (three) times daily as needed (neuropathy). As needed for nerve pain    levothyroxine (SYNTHROID, LEVOTHROID) 75  MCG tablet Take 75 mcg by mouth daily before breakfast.     potassium chloride SA (K-DUR,KLOR-CON) 20 MEQ tablet Take 40 mEq by mouth daily.     QUEtiapine (SEROQUEL) 25 MG tablet Take 1 tablet (25 mg total) by mouth at bedtime. Qty: 30 tablet, Refills: 0    simvastatin (ZOCOR) 10 MG tablet Take 10 mg by mouth at bedtime.    thiamine 100 MG tablet Take 1 tablet (100 mg total) by mouth daily. Qty: 30 tablet, Refills: 0    venlafaxine XR (EFFEXOR-XR) 150 MG 24 hr capsule Take 150 mg by mouth daily.    zolpidem (AMBIEN) 10 MG tablet Take 10 mg by mouth at bedtime.    aspirin EC 81 MG tablet Take 81 mg by mouth daily.    diltiazem (CARDIZEM CD) 180 MG 24 hr capsule Take 1 capsule (180 mg total) by mouth daily. Qty: 30 capsule, Refills: 0      STOP taking these medications     Aspirin-Acetaminophen-Caffeine (EXCEDRIN PO)        Allergies  Allergen Reactions  . Tetanus Toxoids Swelling  . Yellow Dyes (Non-Tartrazine) Itching and Other (See Comments)    Makes patient nervous       The results of significant diagnostics from this hospitalization (including imaging, microbiology, ancillary and laboratory) are listed below for reference.    Significant Diagnostic Studies: Dg Abd 1 View  Result Date: 03/11/2017 CLINICAL DATA:  Nausea, vomiting. EXAM: ABDOMEN - 1 VIEW COMPARISON:  11/11/2016 FINDINGS: Postoperative changes in the thoracolumbar spine. No evidence of bowel obstruction. No supine evidence of free air. No organomegaly or suspicious calcification. IMPRESSION: No acute findings. Electronically Signed   By: Rolm Baptise M.D.   On: 03/11/2017 09:37   Dg Chest Port 1 View  Result Date: 03/11/2017 CLINICAL DATA:  Intractable nausea and vomiting. EXAM: PORTABLE CHEST 1 VIEW COMPARISON:  Chest radiograph 11/12/2016 FINDINGS: Low lung volumes persist. Unchanged heart size and mediastinal contours. Stable mild elevation of right hemidiaphragm. No pulmonary edema, focal airspace  disease, pleural effusion or pneumothorax. Postsurgical change of the left distal clavicle and humerus. Postsurgical change in the thoracolumbar spine, partially included. The bones are under mineralized. IMPRESSION: Unchanged low lung volumes without acute abnormality. Electronically Signed   By: Jeb Levering M.D.   On: 03/11/2017 00:41    Microbiology: Recent Results (from the past 240 hour(s))  Culture, Urine     Status: Abnormal   Collection Time: 03/10/17  8:11 PM  Result Value Ref Range Status   Specimen Description URINE, RANDOM  Final   Special Requests NONE  Final   Culture (A)  Final    >=100,000 COLONIES/mL KLEBSIELLA PNEUMONIAE >=100,000 COLONIES/mL ESCHERICHIA COLI    Report Status 03/13/2017 FINAL  Final   Organism ID, Bacteria KLEBSIELLA PNEUMONIAE (A)  Final   Organism ID, Bacteria ESCHERICHIA COLI (A)  Final      Susceptibility   Escherichia coli - MIC*    AMPICILLIN 4 SENSITIVE Sensitive     CEFAZOLIN <=4 SENSITIVE Sensitive     CEFTRIAXONE <=1 SENSITIVE Sensitive     CIPROFLOXACIN <=0.25 SENSITIVE Sensitive     GENTAMICIN <=1 SENSITIVE Sensitive     IMIPENEM <=0.25 SENSITIVE Sensitive     NITROFURANTOIN <=16 SENSITIVE Sensitive     TRIMETH/SULFA <=20 SENSITIVE Sensitive     AMPICILLIN/SULBACTAM <=2 SENSITIVE Sensitive     PIP/TAZO <=4 SENSITIVE Sensitive     Extended ESBL NEGATIVE Sensitive     * >=100,000 COLONIES/mL ESCHERICHIA COLI   Klebsiella pneumoniae - MIC*    AMPICILLIN RESISTANT Resistant     CEFAZOLIN <=4 SENSITIVE Sensitive     CEFTRIAXONE <=1 SENSITIVE Sensitive     CIPROFLOXACIN <=0.25 SENSITIVE Sensitive     GENTAMICIN <=1 SENSITIVE Sensitive     IMIPENEM 0.5 SENSITIVE Sensitive     NITROFURANTOIN 32 SENSITIVE Sensitive     TRIMETH/SULFA <=20 SENSITIVE Sensitive     AMPICILLIN/SULBACTAM 4 SENSITIVE Sensitive     PIP/TAZO <=4 SENSITIVE Sensitive     Extended ESBL NEGATIVE Sensitive     * >=100,000 COLONIES/mL KLEBSIELLA PNEUMONIAE      Labs: Basic Metabolic Panel:  Recent Labs Lab 03/10/17 1800 03/11/17 0423 03/12/17 0300 03/13/17 0421  NA 138 141 141 139  K 4.2 3.2* 3.5 3.5  CL 106 105 110 108  CO2 20* 21* 22 22  GLUCOSE 130* 129* 122* 118*  BUN 11 12 17 16   CREATININE 0.84 0.83 0.96 1.05*  CALCIUM 9.3 9.2 9.0 8.8*  MG  --   --  1.7  --    Liver Function Tests:  Recent Labs Lab 03/10/17 1800  AST 25  ALT 15  ALKPHOS 96  BILITOT 1.3*  PROT 7.2  ALBUMIN 4.0    Recent Labs Lab 03/10/17 1800  LIPASE 11   No results for input(s): AMMONIA in the last 168 hours. CBC:  Recent Labs Lab 03/10/17 1800 03/11/17 0423 03/12/17 0300  WBC 13.4* 14.3* 11.2*  HGB 15.1* 13.7 12.3  HCT 45.5 41.0 37.8  MCV 83.9 82.3 84.0  PLT 208 254 210   Cardiac Enzymes: No results for input(s): CKTOTAL, CKMB, CKMBINDEX, TROPONINI in the last 168 hours. BNP: BNP (last 3 results)  Recent Labs  11/10/16 2346  BNP 257.8*    ProBNP (last 3 results) No results for input(s): PROBNP in the last 8760 hours.  CBG: No results for input(s): GLUCAP in the last 168 hours.   Signed:  Velvet Bathe MD.  [facility] 03/15/2017, 12:31 PM

## 2017-03-17 DIAGNOSIS — F329 Major depressive disorder, single episode, unspecified: Secondary | ICD-10-CM | POA: Diagnosis not present

## 2017-03-17 DIAGNOSIS — N390 Urinary tract infection, site not specified: Secondary | ICD-10-CM | POA: Diagnosis not present

## 2017-03-17 DIAGNOSIS — E785 Hyperlipidemia, unspecified: Secondary | ICD-10-CM | POA: Diagnosis not present

## 2017-03-17 DIAGNOSIS — I110 Hypertensive heart disease with heart failure: Secondary | ICD-10-CM | POA: Diagnosis not present

## 2017-03-17 DIAGNOSIS — F419 Anxiety disorder, unspecified: Secondary | ICD-10-CM | POA: Diagnosis not present

## 2017-03-17 DIAGNOSIS — L4050 Arthropathic psoriasis, unspecified: Secondary | ICD-10-CM | POA: Diagnosis not present

## 2017-03-17 DIAGNOSIS — I739 Peripheral vascular disease, unspecified: Secondary | ICD-10-CM | POA: Diagnosis not present

## 2017-03-17 DIAGNOSIS — I480 Paroxysmal atrial fibrillation: Secondary | ICD-10-CM | POA: Diagnosis not present

## 2017-03-17 DIAGNOSIS — E039 Hypothyroidism, unspecified: Secondary | ICD-10-CM | POA: Diagnosis not present

## 2017-03-17 DIAGNOSIS — Z7982 Long term (current) use of aspirin: Secondary | ICD-10-CM | POA: Diagnosis not present

## 2017-03-17 DIAGNOSIS — K589 Irritable bowel syndrome without diarrhea: Secondary | ICD-10-CM | POA: Diagnosis not present

## 2017-03-17 DIAGNOSIS — I5032 Chronic diastolic (congestive) heart failure: Secondary | ICD-10-CM | POA: Diagnosis not present

## 2017-03-22 DIAGNOSIS — L4050 Arthropathic psoriasis, unspecified: Secondary | ICD-10-CM | POA: Diagnosis not present

## 2017-03-22 DIAGNOSIS — K589 Irritable bowel syndrome without diarrhea: Secondary | ICD-10-CM | POA: Diagnosis not present

## 2017-03-22 DIAGNOSIS — I110 Hypertensive heart disease with heart failure: Secondary | ICD-10-CM | POA: Diagnosis not present

## 2017-03-22 DIAGNOSIS — I5032 Chronic diastolic (congestive) heart failure: Secondary | ICD-10-CM | POA: Diagnosis not present

## 2017-03-22 DIAGNOSIS — I739 Peripheral vascular disease, unspecified: Secondary | ICD-10-CM | POA: Diagnosis not present

## 2017-03-22 DIAGNOSIS — N390 Urinary tract infection, site not specified: Secondary | ICD-10-CM | POA: Diagnosis not present

## 2017-03-29 DIAGNOSIS — I10 Essential (primary) hypertension: Secondary | ICD-10-CM | POA: Diagnosis not present

## 2017-03-29 DIAGNOSIS — R112 Nausea with vomiting, unspecified: Secondary | ICD-10-CM | POA: Diagnosis not present

## 2017-03-29 DIAGNOSIS — N390 Urinary tract infection, site not specified: Secondary | ICD-10-CM | POA: Diagnosis not present

## 2017-03-30 DIAGNOSIS — I110 Hypertensive heart disease with heart failure: Secondary | ICD-10-CM | POA: Diagnosis not present

## 2017-03-30 DIAGNOSIS — K589 Irritable bowel syndrome without diarrhea: Secondary | ICD-10-CM | POA: Diagnosis not present

## 2017-03-30 DIAGNOSIS — I5032 Chronic diastolic (congestive) heart failure: Secondary | ICD-10-CM | POA: Diagnosis not present

## 2017-03-30 DIAGNOSIS — N390 Urinary tract infection, site not specified: Secondary | ICD-10-CM | POA: Diagnosis not present

## 2017-03-30 DIAGNOSIS — I739 Peripheral vascular disease, unspecified: Secondary | ICD-10-CM | POA: Diagnosis not present

## 2017-03-30 DIAGNOSIS — L4050 Arthropathic psoriasis, unspecified: Secondary | ICD-10-CM | POA: Diagnosis not present

## 2017-04-04 DIAGNOSIS — I110 Hypertensive heart disease with heart failure: Secondary | ICD-10-CM | POA: Diagnosis not present

## 2017-04-04 DIAGNOSIS — L4050 Arthropathic psoriasis, unspecified: Secondary | ICD-10-CM | POA: Diagnosis not present

## 2017-04-04 DIAGNOSIS — N390 Urinary tract infection, site not specified: Secondary | ICD-10-CM | POA: Diagnosis not present

## 2017-04-04 DIAGNOSIS — K589 Irritable bowel syndrome without diarrhea: Secondary | ICD-10-CM | POA: Diagnosis not present

## 2017-04-04 DIAGNOSIS — I5032 Chronic diastolic (congestive) heart failure: Secondary | ICD-10-CM | POA: Diagnosis not present

## 2017-04-04 DIAGNOSIS — I739 Peripheral vascular disease, unspecified: Secondary | ICD-10-CM | POA: Diagnosis not present

## 2017-04-14 DIAGNOSIS — I110 Hypertensive heart disease with heart failure: Secondary | ICD-10-CM | POA: Diagnosis not present

## 2017-04-14 DIAGNOSIS — N390 Urinary tract infection, site not specified: Secondary | ICD-10-CM | POA: Diagnosis not present

## 2017-04-14 DIAGNOSIS — I739 Peripheral vascular disease, unspecified: Secondary | ICD-10-CM | POA: Diagnosis not present

## 2017-04-14 DIAGNOSIS — K589 Irritable bowel syndrome without diarrhea: Secondary | ICD-10-CM | POA: Diagnosis not present

## 2017-04-14 DIAGNOSIS — L4050 Arthropathic psoriasis, unspecified: Secondary | ICD-10-CM | POA: Diagnosis not present

## 2017-04-14 DIAGNOSIS — I5032 Chronic diastolic (congestive) heart failure: Secondary | ICD-10-CM | POA: Diagnosis not present

## 2017-05-23 DIAGNOSIS — H903 Sensorineural hearing loss, bilateral: Secondary | ICD-10-CM | POA: Diagnosis not present

## 2017-06-02 ENCOUNTER — Emergency Department (HOSPITAL_COMMUNITY)
Admission: EM | Admit: 2017-06-02 | Discharge: 2017-06-02 | Disposition: A | Discharge status: Home / Self Care | Payer: Medicare Other | Attending: Emergency Medicine | Admitting: Emergency Medicine

## 2017-06-02 ENCOUNTER — Encounter (HOSPITAL_COMMUNITY): Payer: Self-pay

## 2017-06-02 DIAGNOSIS — Z7982 Long term (current) use of aspirin: Secondary | ICD-10-CM | POA: Insufficient documentation

## 2017-06-02 DIAGNOSIS — Z79899 Other long term (current) drug therapy: Secondary | ICD-10-CM | POA: Diagnosis not present

## 2017-06-02 DIAGNOSIS — I110 Hypertensive heart disease with heart failure: Secondary | ICD-10-CM | POA: Diagnosis not present

## 2017-06-02 DIAGNOSIS — T50901A Poisoning by unspecified drugs, medicaments and biological substances, accidental (unintentional), initial encounter: Secondary | ICD-10-CM | POA: Diagnosis not present

## 2017-06-02 DIAGNOSIS — Z8673 Personal history of transient ischemic attack (TIA), and cerebral infarction without residual deficits: Secondary | ICD-10-CM | POA: Diagnosis not present

## 2017-06-02 DIAGNOSIS — I5032 Chronic diastolic (congestive) heart failure: Secondary | ICD-10-CM | POA: Insufficient documentation

## 2017-06-02 DIAGNOSIS — F419 Anxiety disorder, unspecified: Secondary | ICD-10-CM | POA: Diagnosis not present

## 2017-06-02 DIAGNOSIS — E039 Hypothyroidism, unspecified: Secondary | ICD-10-CM | POA: Insufficient documentation

## 2017-06-02 DIAGNOSIS — T4271XA Poisoning by unspecified antiepileptic and sedative-hypnotic drugs, accidental (unintentional), initial encounter: Secondary | ICD-10-CM | POA: Diagnosis not present

## 2017-06-02 DIAGNOSIS — R000 Tachycardia, unspecified: Secondary | ICD-10-CM | POA: Diagnosis not present

## 2017-06-02 LAB — COMPREHENSIVE METABOLIC PANEL
ALT: 14 U/L (ref 14–54)
AST: 25 U/L (ref 15–41)
Albumin: 3.5 g/dL (ref 3.5–5.0)
Alkaline Phosphatase: 114 U/L (ref 38–126)
Anion gap: 11 (ref 5–15)
BUN: 16 mg/dL (ref 6–20)
CO2: 25 mmol/L (ref 22–32)
Calcium: 8.6 mg/dL — ABNORMAL LOW (ref 8.9–10.3)
Chloride: 106 mmol/L (ref 101–111)
Creatinine, Ser: 1.08 mg/dL — ABNORMAL HIGH (ref 0.44–1.00)
GFR calc Af Amer: 55 mL/min — ABNORMAL LOW (ref >60)
GFR calc non Af Amer: 48 mL/min — ABNORMAL LOW (ref >60)
Glucose, Bld: 104 mg/dL — ABNORMAL HIGH (ref 65–99)
Potassium: 3.4 mmol/L — ABNORMAL LOW (ref 3.5–5.1)
Sodium: 142 mmol/L (ref 135–145)
Total Bilirubin: 1.2 mg/dL (ref 0.3–1.2)
Total Protein: 6.3 g/dL — ABNORMAL LOW (ref 6.5–8.1)

## 2017-06-02 LAB — ACETAMINOPHEN LEVEL: Acetaminophen (Tylenol), Serum: <10 ug/mL — ABNORMAL LOW (ref 10–30)

## 2017-06-02 LAB — MAGNESIUM: Magnesium: 1.7 mg/dL (ref 1.7–2.4)

## 2017-06-02 LAB — ETHANOL: Alcohol, Ethyl (B): <5 mg/dL (ref <5)

## 2017-06-02 LAB — SALICYLATE LEVEL: Salicylate Lvl: <7 mg/dL (ref 2.8–30.0)

## 2017-06-02 MED ORDER — SODIUM CHLORIDE 0.9 % IV BOLUS (SEPSIS)
1000.0000 mL | Freq: Once | INTRAVENOUS | Status: AC
  Administered 2017-06-02: 1000 mL via INTRAVENOUS

## 2017-06-02 NOTE — ED Provider Notes (Signed)
[facility] DEPT Provider Note   CSN: [id number] Arrival date & time: 06/02/17  1338     History   Chief Complaint Chief Complaint  Patient presents with  . Drug Overdose    HPI [name] [name] is a 81 y.o. female with history of anxiety, depression who presents after medication overdose and for medication refill. Patient reports she has been under a lot of stress and anxiety lately due to her husband being sick. Patient reported she was in argument became very anxious and took one 1mg  Xanax, 1 10mg  Ambien, and 3 25mg  Seroquel. She reports she was not trying to hurt herself, but just trying to calm down. Patient reports she hides her medication because a maid comes in the house, and the patient has not been able to find them. She is concerned she is going to run out of Xanax and withdrawal. She states that if she does not have the Xanax that she will have seizure. She is not sure when she can get a refill and she does not think her primary care provider will give her anymore. Patient reports she just feels tired and a little confused now. She denies any other symptoms including chest pain, shortness of breath, abdominal pain, nausea, vomiting, urinary symptoms, lightheadedness, dizziness, headaches, numbness or tingling. She also denies any SI, HI, or AVH.   HPI  Past Medical History:  Diagnosis Date  . Alopecia   . Anemia    hx of years ago   . Anxiety   . ARF (acute renal failure) ([place]) 08/23/2014  . Arthritis    psoriatic arthritis  . Back pain   . Blood transfusion    at age of 4  . Carotid stenosis 12/2016  . Chronic diastolic CHF (congestive heart failure) ([place])   . Depression   . Dysrhythmia 11/13/2016   PAFib for a short time- Echo done 11/14/16 PAF- to follow up with PCP- to see if she is a candiate for anticoags.  Not started at times time due to alcohol abuse.  [name] GERD (gastroesophageal reflux disease)   . H/O hiatal hernia   . History of acute cholangitis   .  Hyperlipidemia   . Hypertension   . Hypothyroidism   . IBS (irritable bowel syndrome)   . Pancreatitis   . Peripheral vascular disease ([place])   . PONV (postoperative nausea and vomiting)    with Breast reduction- only time  . Rosacea   . Seizures ([place]) 11/13/2016   Thopught to be from withdrawl Benzodiazepine  . Stroke [facility])    patient denies-  . Thyroid disease     Patient Active Problem List   Diagnosis Date Noted  . Intractable nausea and vomiting 03/11/2017  . Acute lower UTI 03/11/2017  . Carotid stenosis 12/28/2016  . 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 Paroxysmal atrial fibrillation ([facility])   . Cerebrovascular disease   . Alcohol withdrawal delirium ([place])   . Fever   . Acute encephalopathy 11/10/2016  . Hypothyroidism 11/10/2016  . Hypertensive urgency 11/10/2016  . Altered mental state 11/10/2016  . Seizure ([place]) 11/10/2016  . Chronic diastolic CHF (congestive heart failure) ([place])   . Left carotid stenosis 10/29/2016  . S/P total knee replacement 03/10/2016  . 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 Ileus ([place]) 09/05/2014  . Leukocytosis 09/05/2014  . Vomiting 09/04/2014  . Pulmonary edema 09/04/2014  . Hypokalemia 08/28/2014  . Enteritis due to Clostridium difficile 08/27/2014  . Common bile duct (  CBD) stricture 08/27/2014  . Protein-calorie malnutrition, severe ([place]) 08/24/2014  . Obstructive jaundice 08/23/2014  . Acute cholangitis 08/23/2014  . ARF (acute renal failure) ([place]) 08/23/2014  . Coagulopathy ([place]) 08/23/2014  . Back pain   . Arthritis   . Hypertension   . GERD (gastroesophageal reflux disease)   . Anxiety   . IBS (irritable bowel syndrome)   . Hernia of Right posterior flank 04/18/2012    Past Surgical History:  Procedure Laterality Date  . ABDOMINAL HYSTERECTOMY     partial  . BACK SURGERY  2010   thoractic-screws and rods  . BILE DUCT STENT PLACEMENT  08/26/2014  . BREAST REDUCTION SURGERY      . CHOLECYSTECTOMY  1988   lap  . COLONOSCOPY    . COLONOSCOPY Left 09/08/2014   Procedure: COLONOSCOPY;  Surgeon: Arta Silence, MD;  Location[facility] ENDOSCOPY;  Service: Endoscopy;  Laterality: Left;  . ENDARTERECTOMY Left 12/28/2016  . ENDARTERECTOMY Left 12/28/2016   Procedure: ENDARTERECTOMY CAROTID LEFT WITH XENOSURE BIOLOGIC PATCH ANGIOPLASTY;  Surgeon: Angelia Mould, MD;  Location: [facility];  Service: Vascular;  Laterality: Left;  . ERCP    . ERCP N/A 08/26/2014   Procedure: ENDOSCOPIC RETROGRADE CHOLANGIOPANCREATOGRAPHY (ERCP);  Surgeon: Jeryl Columbia, MD;  Location: [facility] ENDOSCOPY;  Service: Endoscopy;  Laterality: N/A;  . ERCP N/A 09/11/2014   Procedure: ENDOSCOPIC RETROGRADE CHOLANGIOPANCREATOGRAPHY (ERCP);  Surgeon: Arta Silence, MD;  Location: [facility] ENDOSCOPY;  Service: Endoscopy;  Laterality: N/A;  . EUS N/A 09/11/2014   Procedure: ESOPHAGEAL ENDOSCOPIC ULTRASOUND (EUS) RADIAL;  Surgeon: Arta Silence, MD;  Location: [facility] ENDOSCOPY;  Service: Endoscopy;  Laterality: N/A;  . EYE SURGERY Bilateral    Cataract  . JOINT REPLACEMENT  2005   left shoulder replacement   . ORIF CLAVICULAR FRACTURE Left 07/18/2014   Procedure: OPEN REDUCTION INTERNAL FIXATION (ORIF) LEFT CLAVICULAR FRACTURE;  Surgeon: Ninetta Lights, MD;  Location: [facility];  Service: Orthopedics;  Laterality: Left;  . SPYGLASS CHOLANGIOSCOPY N/A 09/11/2014   Procedure: SPYGLASS CHOLANGIOSCOPY;  Surgeon: Arta Silence, MD;  Location: [facility] ENDOSCOPY;  Service: Endoscopy;  Laterality: N/A;  . TONSILLECTOMY  as child  . TOTAL KNEE ARTHROPLASTY Left 03/10/2016   Procedure: LEFT TOTAL KNEE ARTHROPLASTY;  Surgeon: Ninetta Lights, MD;  Location: [facility];  Service: Orthopedics;  Laterality: Left;  [name] VENTRAL HERNIA REPAIR  06/08/2012   Procedure: LAPAROSCOPIC VENTRAL HERNIA;  Surgeon: Adin Hector, MD;  Location: [facility] ORS;  Service: General;  Laterality: Right;    OB History    No data available       Home  Medications    Prior to Admission medications   Medication Sig Start Date End Date Taking? Authorizing Provider  acetaminophen (TYLENOL 8 HOUR ARTHRITIS PAIN) 650 MG CR tablet Take 1,300 mg by mouth every 8 (eight) hours as needed for pain (or headaches).    Yes [provider]  ALPRAZolam Duanne Moron) 1 MG tablet Take 1 mg by mouth 4 (four) times daily as needed for anxiety.    Yes [provider]  amLODipine (NORVASC) 5 MG tablet Take 1 tablet (5 mg total) by mouth daily. 03/16/17  Yes Velvet Bathe, MD  aspirin EC 81 MG tablet Take 81 mg by mouth daily.   Yes [provider]  aspirin-acetaminophen-caffeine (EXCEDRIN MIGRAINE) [phone] MG tablet Take 1-2 tablets by mouth every 6 (six) hours as needed for headache.   Yes [provider]  atenolol (TENORMIN) 50 MG tablet Take 1 tablet (  50 mg total) by mouth daily. 03/16/17  Yes Velvet Bathe, MD  esomeprazole (NEXIUM) 40 MG capsule Take 40 mg by mouth daily. 08/31/16  Yes [provider]  furosemide (LASIX) 20 MG tablet Take 20 mg by mouth daily with breakfast.    Yes [provider]  gabapentin (NEURONTIN) 400 MG capsule Take 400 mg by mouth 3 (three) times daily as needed (for nerve pain).    Yes [provider]  levothyroxine (SYNTHROID, LEVOTHROID) 75 MCG tablet Take 75 mcg by mouth daily before breakfast.  08/28/16  Yes [provider]  potassium chloride SA (K-DUR,KLOR-CON) 20 MEQ tablet Take 40 mEq by mouth daily.    Yes [provider]  QUEtiapine (SEROQUEL) 25 MG tablet Take 1 tablet (25 mg total) by mouth at bedtime. Patient taking differently: Take 25-50 mg by mouth at bedtime.  11/18/16  Yes Regalado, Belkys A, MD  simvastatin (ZOCOR) 10 MG tablet Take 10 mg by mouth at bedtime. 08/09/16  Yes [provider]  venlafaxine XR (EFFEXOR-XR) 150 MG 24 hr capsule Take 150 mg by mouth daily. 02/18/17  Yes [provider]  zolpidem (AMBIEN) 10 MG tablet Take  10 mg by mouth at bedtime. 02/14/17  Yes [provider]  diltiazem (CARDIZEM CD) 180 MG 24 hr capsule Take 1 capsule (180 mg total) by mouth daily. Patient not taking: Reported on 06/02/2017 11/19/16   Niel Hummer A, MD  folic acid (FOLVITE) 1 MG tablet Take 1 tablet (1 mg total) by mouth daily. Patient not taking: Reported on 06/02/2017 11/19/16   Niel Hummer A, MD  thiamine 100 MG tablet Take 1 tablet (100 mg total) by mouth daily. Patient not taking: Reported on 06/02/2017 11/19/16   Elmarie Shiley, MD    Family History Family History  Problem Relation Age of Onset  . Heart disease Mother   . Cancer Father        lung    Social History Social History  Substance Use Topics  . Smoking status: Never Smoker  . Smokeless tobacco: Never Used  . Alcohol use No     Comment: rare     Allergies   Tetanus toxoids and Yellow dyes (non-tartrazine)   Review of Systems Review of Systems  Constitutional: Negative for chills and fever.  HENT: Negative for facial swelling and sore throat.   Respiratory: Negative for shortness of breath.   Cardiovascular: Negative for chest pain.  Gastrointestinal: Negative for abdominal pain, nausea and vomiting.  Genitourinary: Negative for dysuria.  Musculoskeletal: Negative for back pain.  Skin: Negative for rash and wound.  Neurological: Negative for headaches.  Psychiatric/Behavioral: The patient is nervous/anxious.      Physical Exam Updated Vital Signs BP (!) 178/96   Pulse (!) 111   Temp 98.8 F (37.1 C) (Oral)   Resp (!) 23   SpO2 96%   Physical Exam  Constitutional: She appears well-developed and well-nourished. No distress.  HENT:  Head: Normocephalic and atraumatic.  Mouth/Throat: Oropharynx is clear and moist. No oropharyngeal exudate.  Eyes: Conjunctivae are normal. Pupils are equal, round, and reactive to light. Right eye exhibits no discharge. Left eye exhibits no discharge. No scleral icterus.  Neck:  Normal range of motion. Neck supple. No thyromegaly present.  Cardiovascular: Regular rhythm, normal heart sounds and intact distal pulses.  Tachycardia present.  Exam reveals no gallop and no friction rub.   No murmur heard. Pulmonary/Chest: Effort normal and breath sounds normal. No stridor. No respiratory distress.  She has no wheezes. She has no rales.  Abdominal: Soft. Bowel sounds are normal. She exhibits no distension. There is no tenderness. There is no rebound and no guarding.  Musculoskeletal: She exhibits no edema.  Lymphadenopathy:    She has no cervical adenopathy.  Neurological: She is alert. Coordination normal.  Skin: Skin is warm and dry. No rash noted. She is not diaphoretic. No pallor.  Psychiatric: Her mood appears anxious. She is not actively hallucinating. Cognition and memory are not impaired. She expresses no homicidal and no suicidal ideation.  Nursing note and vitals reviewed.    ED Treatments / Results  Labs (all labs ordered are listed, but only abnormal results are displayed) Labs Reviewed  COMPREHENSIVE METABOLIC PANEL - Abnormal; Notable for the following:       Result Value   Potassium 3.4 (*)    Glucose, Bld 104 (*)    Creatinine, Ser 1.08 (*)    Calcium 8.6 (*)    Total Protein 6.3 (*)    GFR calc non Af Amer 48 (*)    GFR calc Af Amer 55 (*)    All other components within normal limits  ACETAMINOPHEN LEVEL - Abnormal; Notable for the following:    Acetaminophen (Tylenol), Serum <10 (*)    All other components within normal limits  SALICYLATE LEVEL  ETHANOL  MAGNESIUM    EKG  EKG Interpretation  Date/Time:  Thursday June 02 2017 13:46:05 EDT Ventricular Rate:  121 PR Interval:  178 QRS Duration: 76 QT Interval:  312 QTC Calculation: 443 R Axis:   -41 Text Interpretation:  Sinus tachycardia with Premature atrial complexes Left axis deviation Left ventricular hypertrophy with repolarization abnormality Anteroseptal infarct , age  undetermined Abnormal ECG Otherwise no significant change Confirmed by Addison Lank [phone]) on 06/02/2017 4:56:52 PM       Radiology No results found.  Procedures Procedures (including critical care time)  Medications Ordered in ED Medications  sodium chloride 0.9 % bolus 1,000 mL (0 mLs Intravenous Stopped 06/02/17 1909)  sodium chloride 0.9 % bolus 1,000 mL (0 mLs Intravenous Stopped 06/02/17 2112)     Initial Impression / Assessment and Plan / ED Course  I have reviewed the triage vital signs and the nursing notes.  Pertinent labs & imaging results that were available during my care of the patient were reviewed by me and considered in my medical decision making (see chart for details).     Janelle from poison control reported tachycardia and drowsiness most common with Seroquel ingestion. Minimum 6 hour obs from time of arrival until return to baseline. R/o out co-ingestion, as well as CMP. IVF, Cardiac monitoring. If QTc to 500, replete potassium, magnesium. Repeat EKG prior discharge to assess QTc stays normal.   CMP, magnesium, Tylenol, salicylate, ethanol levels within normal limits. Patient continuously anxious and mildly tachycardic, however returning back to baseline. Repeat EKG showed QTC within normal limits as well. Patient advised to call her primary care provider for any necessary refills of her Xanax. Return precautions discussed. Patient understands and agrees with plan. Patient discharged in satisfactory condition. I discussed patient case with Dr. Leonette Monarch who guided the patient's management and agrees with plan.   Final Clinical Impressions(s) / ED Diagnoses   Final diagnoses:  Accidental drug overdose, initial encounter    New Prescriptions Discharge Medication List as of 06/02/2017  9:06 PM       Vardaan Depascale, Bea Graff, PA-C 06/03/17 0020    Fatima Blank, MD 06/15/17  0840  

## 2017-06-02 NOTE — ED Notes (Signed)
Patient ambulatory to restroom and back with steady gait. NAD noted.  

## 2017-06-02 NOTE — ED Notes (Signed)
Poison control closing the pt case.

## 2017-06-02 NOTE — ED Triage Notes (Signed)
Pt is here with daughter after a argument with pts husband. She reports she has been under a lot of stress with husband being sick. She misplaced her medications and started to get anxious. Pt reports she then took 1mg  xanax, 1- 10mg  ambien, 3- 25mg  seroquel  That she had on counter in attempts to calm down and help her get some rest because she was panicking about misplacing her meds. She reports she was not trying to harm herself. Pt is appropriate in triage but appears anxious. She states she is afraid she will withdraw from xanax because she cant get a refill until the beginning of the month.

## 2017-06-02 NOTE — Discharge Instructions (Signed)
Make sure to take your medications only as prescribed. Please return to the emergency department if you develop any new or worsening symptoms.

## 2017-06-02 NOTE — ED Notes (Signed)
Pt family is asking for a update from Dr

## 2017-06-26 ENCOUNTER — Emergency Department (HOSPITAL_COMMUNITY): Payer: Medicare Other

## 2017-06-26 ENCOUNTER — Inpatient Hospital Stay (HOSPITAL_COMMUNITY): Payer: Medicare Other

## 2017-06-26 ENCOUNTER — Inpatient Hospital Stay (HOSPITAL_COMMUNITY)
Admission: EM | Admit: 2017-06-26 | Discharge: 2017-07-02 | DRG: 896 | Disposition: A | Discharge status: Skilled Nursing Facility | Payer: Medicare Other | Attending: Internal Medicine | Admitting: Internal Medicine

## 2017-06-26 ENCOUNTER — Encounter (HOSPITAL_COMMUNITY): Payer: Self-pay

## 2017-06-26 DIAGNOSIS — R739 Hyperglycemia, unspecified: Secondary | ICD-10-CM | POA: Diagnosis present

## 2017-06-26 DIAGNOSIS — R404 Transient alteration of awareness: Secondary | ICD-10-CM | POA: Diagnosis not present

## 2017-06-26 DIAGNOSIS — Z8249 Family history of ischemic heart disease and other diseases of the circulatory system: Secondary | ICD-10-CM

## 2017-06-26 DIAGNOSIS — I110 Hypertensive heart disease with heart failure: Secondary | ICD-10-CM | POA: Diagnosis present

## 2017-06-26 DIAGNOSIS — R29810 Facial weakness: Secondary | ICD-10-CM | POA: Diagnosis not present

## 2017-06-26 DIAGNOSIS — R6889 Other general symptoms and signs: Secondary | ICD-10-CM | POA: Diagnosis not present

## 2017-06-26 DIAGNOSIS — S0081XA Abrasion of other part of head, initial encounter: Secondary | ICD-10-CM | POA: Diagnosis not present

## 2017-06-26 DIAGNOSIS — E039 Hypothyroidism, unspecified: Secondary | ICD-10-CM | POA: Diagnosis present

## 2017-06-26 DIAGNOSIS — Z9889 Other specified postprocedural states: Secondary | ICD-10-CM | POA: Diagnosis not present

## 2017-06-26 DIAGNOSIS — G9340 Encephalopathy, unspecified: Secondary | ICD-10-CM | POA: Diagnosis not present

## 2017-06-26 DIAGNOSIS — Z9049 Acquired absence of other specified parts of digestive tract: Secondary | ICD-10-CM

## 2017-06-26 DIAGNOSIS — M1990 Unspecified osteoarthritis, unspecified site: Secondary | ICD-10-CM | POA: Diagnosis present

## 2017-06-26 DIAGNOSIS — R400 Somnolence: Secondary | ICD-10-CM | POA: Diagnosis not present

## 2017-06-26 DIAGNOSIS — R627 Adult failure to thrive: Secondary | ICD-10-CM | POA: Diagnosis present

## 2017-06-26 DIAGNOSIS — K589 Irritable bowel syndrome without diarrhea: Secondary | ICD-10-CM | POA: Diagnosis present

## 2017-06-26 DIAGNOSIS — Z9181 History of falling: Secondary | ICD-10-CM

## 2017-06-26 DIAGNOSIS — S299XXA Unspecified injury of thorax, initial encounter: Secondary | ICD-10-CM | POA: Diagnosis not present

## 2017-06-26 DIAGNOSIS — Z96612 Presence of left artificial shoulder joint: Secondary | ICD-10-CM | POA: Diagnosis present

## 2017-06-26 DIAGNOSIS — E785 Hyperlipidemia, unspecified: Secondary | ICD-10-CM | POA: Diagnosis present

## 2017-06-26 DIAGNOSIS — W19XXXA Unspecified fall, initial encounter: Secondary | ICD-10-CM | POA: Diagnosis not present

## 2017-06-26 DIAGNOSIS — Z96652 Presence of left artificial knee joint: Secondary | ICD-10-CM | POA: Diagnosis present

## 2017-06-26 DIAGNOSIS — R296 Repeated falls: Secondary | ICD-10-CM | POA: Diagnosis present

## 2017-06-26 DIAGNOSIS — Z87440 Personal history of urinary (tract) infections: Secondary | ICD-10-CM

## 2017-06-26 DIAGNOSIS — Z91041 Radiographic dye allergy status: Secondary | ICD-10-CM | POA: Diagnosis not present

## 2017-06-26 DIAGNOSIS — F419 Anxiety disorder, unspecified: Secondary | ICD-10-CM | POA: Diagnosis not present

## 2017-06-26 DIAGNOSIS — F411 Generalized anxiety disorder: Secondary | ICD-10-CM | POA: Diagnosis present

## 2017-06-26 DIAGNOSIS — I480 Paroxysmal atrial fibrillation: Secondary | ICD-10-CM | POA: Diagnosis present

## 2017-06-26 DIAGNOSIS — K219 Gastro-esophageal reflux disease without esophagitis: Secondary | ICD-10-CM | POA: Diagnosis present

## 2017-06-26 DIAGNOSIS — E860 Dehydration: Secondary | ICD-10-CM

## 2017-06-26 DIAGNOSIS — T148XXA Other injury of unspecified body region, initial encounter: Secondary | ICD-10-CM | POA: Diagnosis not present

## 2017-06-26 DIAGNOSIS — Z90710 Acquired absence of both cervix and uterus: Secondary | ICD-10-CM

## 2017-06-26 DIAGNOSIS — Z79899 Other long term (current) drug therapy: Secondary | ICD-10-CM

## 2017-06-26 DIAGNOSIS — F13239 Sedative, hypnotic or anxiolytic dependence with withdrawal, unspecified: Secondary | ICD-10-CM | POA: Diagnosis present

## 2017-06-26 DIAGNOSIS — Z811 Family history of alcohol abuse and dependence: Secondary | ICD-10-CM | POA: Diagnosis not present

## 2017-06-26 DIAGNOSIS — Z801 Family history of malignant neoplasm of trachea, bronchus and lung: Secondary | ICD-10-CM

## 2017-06-26 DIAGNOSIS — F418 Other specified anxiety disorders: Secondary | ICD-10-CM | POA: Diagnosis present

## 2017-06-26 DIAGNOSIS — R32 Unspecified urinary incontinence: Secondary | ICD-10-CM | POA: Diagnosis present

## 2017-06-26 DIAGNOSIS — R4182 Altered mental status, unspecified: Secondary | ICD-10-CM | POA: Diagnosis present

## 2017-06-26 DIAGNOSIS — I5032 Chronic diastolic (congestive) heart failure: Secondary | ICD-10-CM | POA: Diagnosis present

## 2017-06-26 DIAGNOSIS — Z813 Family history of other psychoactive substance abuse and dependence: Secondary | ICD-10-CM | POA: Diagnosis not present

## 2017-06-26 DIAGNOSIS — Z7982 Long term (current) use of aspirin: Secondary | ICD-10-CM | POA: Diagnosis not present

## 2017-06-26 DIAGNOSIS — R4781 Slurred speech: Secondary | ICD-10-CM | POA: Diagnosis not present

## 2017-06-26 DIAGNOSIS — S0990XA Unspecified injury of head, initial encounter: Secondary | ICD-10-CM | POA: Diagnosis not present

## 2017-06-26 DIAGNOSIS — F13180 Sedative, hypnotic or anxiolytic abuse with sedative, hypnotic or anxiolytic-induced anxiety disorder: Secondary | ICD-10-CM | POA: Diagnosis not present

## 2017-06-26 DIAGNOSIS — R569 Unspecified convulsions: Secondary | ICD-10-CM

## 2017-06-26 DIAGNOSIS — Z887 Allergy status to serum and vaccine status: Secondary | ICD-10-CM | POA: Diagnosis not present

## 2017-06-26 DIAGNOSIS — Z9842 Cataract extraction status, left eye: Secondary | ICD-10-CM

## 2017-06-26 DIAGNOSIS — D72829 Elevated white blood cell count, unspecified: Secondary | ICD-10-CM | POA: Diagnosis present

## 2017-06-26 DIAGNOSIS — Z9841 Cataract extraction status, right eye: Secondary | ICD-10-CM

## 2017-06-26 DIAGNOSIS — M6282 Rhabdomyolysis: Secondary | ICD-10-CM | POA: Diagnosis present

## 2017-06-26 DIAGNOSIS — I739 Peripheral vascular disease, unspecified: Secondary | ICD-10-CM | POA: Diagnosis present

## 2017-06-26 DIAGNOSIS — R55 Syncope and collapse: Secondary | ICD-10-CM | POA: Diagnosis not present

## 2017-06-26 DIAGNOSIS — Z818 Family history of other mental and behavioral disorders: Secondary | ICD-10-CM | POA: Diagnosis not present

## 2017-06-26 DIAGNOSIS — G40909 Epilepsy, unspecified, not intractable, without status epilepticus: Secondary | ICD-10-CM | POA: Diagnosis not present

## 2017-06-26 DIAGNOSIS — I10 Essential (primary) hypertension: Secondary | ICD-10-CM | POA: Diagnosis present

## 2017-06-26 LAB — CBC WITH DIFFERENTIAL/PLATELET
BASOS PCT: 0 %
Basophils Absolute: 0 10*3/uL (ref 0.0–0.1)
EOS ABS: 0 10*3/uL (ref 0.0–0.7)
Eosinophils Relative: 0 %
HCT: 39.5 % (ref 36.0–46.0)
HEMOGLOBIN: 12.9 g/dL (ref 12.0–15.0)
Lymphocytes Relative: 3 %
Lymphs Abs: 0.5 10*3/uL — ABNORMAL LOW (ref 0.7–4.0)
MCH: 28 pg (ref 26.0–34.0)
MCHC: 32.7 g/dL (ref 30.0–36.0)
MCV: 85.9 fL (ref 78.0–100.0)
MONOS PCT: 6 %
Monocytes Absolute: 0.8 10*3/uL (ref 0.1–1.0)
NEUTROS PCT: 91 %
Neutro Abs: 13.7 10*3/uL — ABNORMAL HIGH (ref 1.7–7.7)
Platelets: 226 10*3/uL (ref 150–400)
RBC: 4.6 MIL/uL (ref 3.87–5.11)
RDW: 14.6 % (ref 11.5–15.5)
WBC: 15.1 10*3/uL — ABNORMAL HIGH (ref 4.0–10.5)

## 2017-06-26 LAB — URINALYSIS, ROUTINE W REFLEX MICROSCOPIC
Bacteria, UA: NONE SEEN
Bilirubin Urine: NEGATIVE
Glucose, UA: NEGATIVE mg/dL
Ketones, ur: 5 mg/dL — AB
Leukocytes, UA: NEGATIVE
Nitrite: NEGATIVE
PH: 5 (ref 5.0–8.0)
Protein, ur: 100 mg/dL — AB
SPECIFIC GRAVITY, URINE: 1.014 (ref 1.005–1.030)
SQUAMOUS EPITHELIAL / LPF: NONE SEEN

## 2017-06-26 LAB — I-STAT CHEM 8, ED
BUN: 16 mg/dL (ref 6–20)
Calcium, Ion: 1.18 mmol/L (ref 1.15–1.40)
Chloride: 110 mmol/L (ref 101–111)
Creatinine, Ser: 1 mg/dL (ref 0.44–1.00)
Glucose, Bld: 121 mg/dL — ABNORMAL HIGH (ref 65–99)
HEMATOCRIT: 39 % (ref 36.0–46.0)
HEMOGLOBIN: 13.3 g/dL (ref 12.0–15.0)
Potassium: 3.8 mmol/L (ref 3.5–5.1)
SODIUM: 144 mmol/L (ref 135–145)
TCO2: 21 mmol/L (ref 0–100)

## 2017-06-26 LAB — MAGNESIUM: Magnesium: 1.8 mg/dL (ref 1.7–2.4)

## 2017-06-26 LAB — COMPREHENSIVE METABOLIC PANEL
ALK PHOS: 96 U/L (ref 38–126)
ALT: 36 U/L (ref 14–54)
AST: 68 U/L — ABNORMAL HIGH (ref 15–41)
Albumin: 3.4 g/dL — ABNORMAL LOW (ref 3.5–5.0)
Anion gap: 12 (ref 5–15)
BILIRUBIN TOTAL: 1.2 mg/dL (ref 0.3–1.2)
BUN: 14 mg/dL (ref 6–20)
CALCIUM: 9.2 mg/dL (ref 8.9–10.3)
CO2: 19 mmol/L — AB (ref 22–32)
CREATININE: 1.13 mg/dL — AB (ref 0.44–1.00)
Chloride: 111 mmol/L (ref 101–111)
GFR, EST AFRICAN AMERICAN: 53 mL/min — AB (ref >60)
GFR, EST NON AFRICAN AMERICAN: 45 mL/min — AB (ref >60)
Glucose, Bld: 120 mg/dL — ABNORMAL HIGH (ref 65–99)
Potassium: 3.9 mmol/L (ref 3.5–5.1)
SODIUM: 142 mmol/L (ref 135–145)
Total Protein: 6.5 g/dL (ref 6.5–8.1)

## 2017-06-26 LAB — I-STAT CG4 LACTIC ACID, ED: LACTIC ACID, VENOUS: 1.17 mmol/L (ref 0.5–1.9)

## 2017-06-26 LAB — RAPID URINE DRUG SCREEN, HOSP PERFORMED
Amphetamines: NOT DETECTED
Barbiturates: NOT DETECTED
Benzodiazepines: POSITIVE — AB
Cocaine: NOT DETECTED
OPIATES: NOT DETECTED
Tetrahydrocannabinol: NOT DETECTED

## 2017-06-26 LAB — I-STAT TROPONIN, ED: Troponin i, poc: 0.04 ng/mL (ref 0.00–0.08)

## 2017-06-26 LAB — PHOSPHORUS: PHOSPHORUS: 4 mg/dL (ref 2.5–4.6)

## 2017-06-26 LAB — CK: Total CK: 2013 U/L — ABNORMAL HIGH (ref 38–234)

## 2017-06-26 MED ORDER — QUETIAPINE FUMARATE 25 MG PO TABS
25.0000 mg | ORAL_TABLET | Freq: Every day | ORAL | Status: DC
  Administered 2017-06-27 – 2017-07-01 (×5): 25 mg via ORAL
  Filled 2017-06-26 (×5): qty 1

## 2017-06-26 MED ORDER — VENLAFAXINE HCL ER 150 MG PO CP24
150.0000 mg | ORAL_CAPSULE | Freq: Every day | ORAL | Status: DC
  Administered 2017-06-27 – 2017-07-02 (×6): 150 mg via ORAL
  Filled 2017-06-26: qty 2
  Filled 2017-06-26 (×3): qty 1
  Filled 2017-06-26: qty 2
  Filled 2017-06-26: qty 1
  Filled 2017-06-26: qty 2
  Filled 2017-06-26 (×2): qty 1
  Filled 2017-06-26 (×3): qty 2

## 2017-06-26 MED ORDER — DEXTROSE 5 % IV SOLN
1.0000 g | Freq: Once | INTRAVENOUS | Status: AC
  Administered 2017-06-26: 1 g via INTRAVENOUS
  Filled 2017-06-26: qty 10

## 2017-06-26 MED ORDER — ASPIRIN EC 81 MG PO TBEC
81.0000 mg | DELAYED_RELEASE_TABLET | Freq: Every day | ORAL | Status: DC
  Administered 2017-06-27 – 2017-07-02 (×6): 81 mg via ORAL
  Filled 2017-06-26 (×6): qty 1

## 2017-06-26 MED ORDER — POTASSIUM CHLORIDE CRYS ER 20 MEQ PO TBCR
40.0000 meq | EXTENDED_RELEASE_TABLET | Freq: Every day | ORAL | Status: DC
  Administered 2017-06-27 – 2017-07-02 (×6): 40 meq via ORAL
  Filled 2017-06-26 (×6): qty 2

## 2017-06-26 MED ORDER — ATENOLOL 50 MG PO TABS
50.0000 mg | ORAL_TABLET | Freq: Every day | ORAL | Status: DC
  Administered 2017-06-27 – 2017-07-02 (×6): 50 mg via ORAL
  Filled 2017-06-26: qty 1
  Filled 2017-06-26 (×2): qty 2
  Filled 2017-06-26: qty 1
  Filled 2017-06-26 (×2): qty 2
  Filled 2017-06-26 (×4): qty 1
  Filled 2017-06-26 (×2): qty 2

## 2017-06-26 MED ORDER — PANTOPRAZOLE SODIUM 40 MG PO TBEC
40.0000 mg | DELAYED_RELEASE_TABLET | Freq: Every day | ORAL | Status: DC
  Administered 2017-06-27 – 2017-07-02 (×6): 40 mg via ORAL
  Filled 2017-06-26 (×6): qty 1

## 2017-06-26 MED ORDER — AMLODIPINE BESYLATE 5 MG PO TABS
5.0000 mg | ORAL_TABLET | Freq: Every day | ORAL | Status: DC
  Administered 2017-06-27 – 2017-06-30 (×4): 5 mg via ORAL
  Filled 2017-06-26 (×5): qty 1

## 2017-06-26 MED ORDER — ONDANSETRON HCL 4 MG/2ML IJ SOLN
4.0000 mg | Freq: Four times a day (QID) | INTRAMUSCULAR | Status: DC | PRN
Start: 2017-06-26 — End: 2017-07-02
  Administered 2017-06-28 – 2017-06-29 (×2): 4 mg via INTRAVENOUS
  Filled 2017-06-26 (×2): qty 2

## 2017-06-26 MED ORDER — FUROSEMIDE 20 MG PO TABS
20.0000 mg | ORAL_TABLET | Freq: Every day | ORAL | Status: DC
  Administered 2017-06-27 – 2017-07-02 (×6): 20 mg via ORAL
  Filled 2017-06-26 (×6): qty 1

## 2017-06-26 MED ORDER — SIMVASTATIN 10 MG PO TABS
10.0000 mg | ORAL_TABLET | Freq: Every day | ORAL | Status: DC
  Administered 2017-06-26 – 2017-07-01 (×5): 10 mg via ORAL
  Filled 2017-06-26 (×7): qty 1

## 2017-06-26 MED ORDER — ACETAMINOPHEN 650 MG RE SUPP
650.0000 mg | Freq: Four times a day (QID) | RECTAL | Status: DC | PRN
Start: 2017-06-26 — End: 2017-07-02

## 2017-06-26 MED ORDER — SODIUM CHLORIDE 0.9 % IV BOLUS (SEPSIS)
500.0000 mL | Freq: Once | INTRAVENOUS | Status: AC
  Administered 2017-06-26: 500 mL via INTRAVENOUS

## 2017-06-26 MED ORDER — HYDRALAZINE HCL 20 MG/ML IJ SOLN: 5.0000 mg | Freq: Three times a day (TID) | INTRAMUSCULAR | Status: DC | PRN

## 2017-06-26 MED ORDER — ACETAMINOPHEN ER 650 MG PO TBCR: 650.0000 mg | EXTENDED_RELEASE_TABLET | Freq: Three times a day (TID) | ORAL | Status: DC

## 2017-06-26 MED ORDER — BISACODYL 10 MG RE SUPP: 10.0000 mg | Freq: Every day | RECTAL | Status: DC | PRN

## 2017-06-26 MED ORDER — LEVOTHYROXINE SODIUM 75 MCG PO TABS
75.0000 ug | ORAL_TABLET | Freq: Every day | ORAL | Status: DC
  Administered 2017-06-27 – 2017-07-02 (×6): 75 ug via ORAL
  Filled 2017-06-26 (×6): qty 1

## 2017-06-26 MED ORDER — ONDANSETRON HCL 4 MG PO TABS
4.0000 mg | ORAL_TABLET | Freq: Four times a day (QID) | ORAL | Status: DC | PRN
  Administered 2017-06-28: 4 mg via ORAL
  Filled 2017-06-26: qty 1

## 2017-06-26 MED ORDER — ACETAMINOPHEN 325 MG PO TABS
650.0000 mg | ORAL_TABLET | Freq: Four times a day (QID) | ORAL | Status: DC | PRN
  Administered 2017-06-26 – 2017-07-02 (×7): 650 mg via ORAL
  Filled 2017-06-26 (×7): qty 2

## 2017-06-26 MED ORDER — DEXTROSE 5 % IV SOLN
1.0000 g | INTRAVENOUS | Status: DC
  Administered 2017-06-27 – 2017-06-30 (×4): 1 g via INTRAVENOUS
  Filled 2017-06-26 (×6): qty 10

## 2017-06-26 NOTE — ED Provider Notes (Signed)
[facility] DEPT Provider Note   CSN: [id number] Arrival date & time: 06/26/17  1112     History   Chief Complaint Chief Complaint  Patient presents with  . Fall    HPI [name] [name] is a 81 y.o. female.  The history is provided by the patient and the EMS personnel. No language interpreter was used.  Fall     [name] [name] is a 81 y.o. female who presents to the Emergency Department complaining of fall.  Level V caveat due to confusion.  Per EMS report she was found on the floor, face down by her husband at 3 in the morning. She had an unwitnessed fall. He covered her with a blanket and when she could not get up at 10 in the morning he called 911. At her baseline she is alert and oriented 4 and per EMS report she is confused compared to baseline. She states that she does not recall why she fell and she denies any pain in the emergency department.  Past Medical History:  Diagnosis Date  . Alopecia   . Anemia    hx of years ago   . Anxiety   . ARF (acute renal failure) ([place]) 08/23/2014  . Arthritis    psoriatic arthritis  . Back pain   . Blood transfusion    at age of 4  . Carotid stenosis 12/2016  . Chronic diastolic CHF (congestive heart failure) ([place])   . Depression   . Dysrhythmia 11/13/2016   PAFib for a short time- Echo done 11/14/16 PAF- to follow up with PCP- to see if she is a candiate for anticoags.  Not started at times time due to alcohol abuse.  [name] GERD (gastroesophageal reflux disease)   . H/O hiatal hernia   . History of acute cholangitis   . Hyperlipidemia   . Hypertension   . Hypothyroidism   . IBS (irritable bowel syndrome)   . Pancreatitis   . Peripheral vascular disease ([place])   . PONV (postoperative nausea and vomiting)    with Breast reduction- only time  . Rosacea   . Seizures ([place]) 11/13/2016   Thopught to be from withdrawl Benzodiazepine  . Stroke [facility])    patient denies-  . Thyroid disease     Patient Active Problem List   Diagnosis  Date Noted  . Fall 06/26/2017  . Intractable nausea and vomiting 03/11/2017  . Acute lower UTI 03/11/2017  . Carotid stenosis 12/28/2016  . 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 Paroxysmal atrial fibrillation ([facility])   . Cerebrovascular disease   . Alcohol withdrawal delirium ([place])   . Fever   . Acute encephalopathy 11/10/2016  . Hypothyroidism 11/10/2016  . Hypertensive urgency 11/10/2016  . Altered mental state 11/10/2016  . Seizure ([place]) 11/10/2016  . Chronic diastolic CHF (congestive heart failure) ([place])   . Left carotid stenosis 10/29/2016  . S/P total knee replacement 03/10/2016  . 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 Ileus ([place]) 09/05/2014  . Leukocytosis 09/05/2014  . Vomiting 09/04/2014  . Pulmonary edema 09/04/2014  . Hypokalemia 08/28/2014  . Enteritis due to Clostridium difficile 08/27/2014  . 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 Protein-calorie malnutrition, severe ([place]) 08/24/2014  . Obstructive jaundice 08/23/2014  . Acute cholangitis 08/23/2014  . ARF (acute renal failure) ([place]) 08/23/2014  . Coagulopathy ([place]) 08/23/2014  . Back pain   . Arthritis   . Hypertension   . GERD (gastroesophageal reflux disease)   .  Anxiety   . IBS (irritable bowel syndrome)   . Hernia of Right posterior flank 04/18/2012    Past Surgical History:  Procedure Laterality Date  . ABDOMINAL HYSTERECTOMY     partial  . BACK SURGERY  2010   thoractic-screws and rods  . BILE DUCT STENT PLACEMENT  08/26/2014  . BREAST REDUCTION SURGERY    . CHOLECYSTECTOMY  1988   lap  . COLONOSCOPY    . COLONOSCOPY Left 09/08/2014   Procedure: COLONOSCOPY;  Surgeon: Arta Silence, MD;  Location: [facility] ENDOSCOPY;  Service: Endoscopy;  Laterality: Left;  . ENDARTERECTOMY Left 12/28/2016  . ENDARTERECTOMY Left 12/28/2016   Procedure: ENDARTERECTOMY CAROTID LEFT WITH XENOSURE BIOLOGIC PATCH ANGIOPLASTY;  Surgeon: Angelia Mould, MD;  Location: [place] [facility];  Service: Vascular;  Laterality: Left;  . ERCP    . ERCP N/A 08/26/2014   Procedure: ENDOSCOPIC RETROGRADE CHOLANGIOPANCREATOGRAPHY (ERCP);  Surgeon: Jeryl Columbia, MD;  Location: [facility] ENDOSCOPY;  Service: Endoscopy;  Laterality: N/A;  . ERCP N/A 09/11/2014   Procedure: ENDOSCOPIC RETROGRADE CHOLANGIOPANCREATOGRAPHY (ERCP);  Surgeon: Arta Silence, MD;  Location: [facility] ENDOSCOPY;  Service: Endoscopy;  Laterality: N/A;  . EUS N/A 09/11/2014   Procedure: ESOPHAGEAL ENDOSCOPIC ULTRASOUND (EUS) RADIAL;  Surgeon: Arta Silence, MD;  Location: [facility] ENDOSCOPY;  Service: Endoscopy;  Laterality: N/A;  . EYE SURGERY Bilateral    Cataract  . JOINT REPLACEMENT  2005   left shoulder replacement   . ORIF CLAVICULAR FRACTURE Left 07/18/2014   Procedure: OPEN REDUCTION INTERNAL FIXATION (ORIF) LEFT CLAVICULAR FRACTURE;  Surgeon: Ninetta Lights, MD;  Location: [place];  Service: Orthopedics;  Laterality: Left;  . SPYGLASS CHOLANGIOSCOPY N/A 09/11/2014   Procedure: SPYGLASS CHOLANGIOSCOPY;  Surgeon: Arta Silence, MD;  Location: [facility] ENDOSCOPY;  Service: Endoscopy;  Laterality: N/A;  . TONSILLECTOMY  as child  . TOTAL KNEE ARTHROPLASTY Left 03/10/2016   Procedure: LEFT TOTAL KNEE ARTHROPLASTY;  Surgeon: Ninetta Lights, MD;  Location: [place] [facility];  Service: Orthopedics;  Laterality: Left;  [name] VENTRAL HERNIA REPAIR  06/08/2012   Procedure: LAPAROSCOPIC VENTRAL HERNIA;  Surgeon: Adin Hector, MD;  Location: [facility] ORS;  Service: General;  Laterality: Right;    OB History    No data available       Home Medications    Prior to Admission medications   Medication Sig Start Date End Date Taking? Authorizing Provider  acetaminophen (TYLENOL 8 HOUR ARTHRITIS PAIN) 650 MG CR tablet Take 1,300 mg by mouth every 8 (eight) hours as needed for pain (or headaches).     [provider]  ALPRAZolam Duanne Moron) 1 MG tablet Take 1 mg by mouth 4 (four) times daily as needed for anxiety.     [provider]  amLODipine (NORVASC) 5 MG tablet Take 1 tablet (5 mg total) by mouth daily. 03/16/17   Velvet Bathe, MD  aspirin EC 81 MG tablet Take 81 mg by mouth daily.    [provider]  aspirin-acetaminophen-caffeine (EXCEDRIN MIGRAINE) [phone] MG tablet Take 1-2 tablets by mouth every 6 (six) hours as needed for headache.    [provider]  atenolol (TENORMIN) 50 MG tablet Take 1 tablet (50 mg total) by mouth daily. 03/16/17   Velvet Bathe, MD  diltiazem (CARDIZEM CD) 180 MG 24 hr capsule Take 1 capsule (180 mg total) by mouth daily. Patient not taking: Reported on 06/02/2017 11/19/16   Regalado, Jerald Kief A, MD  esomeprazole (NEXIUM) 40 MG capsule Take 40 mg by mouth daily. 08/31/16  [provider]  folic acid (FOLVITE) 1 MG tablet Take 1 tablet (1 mg total) by mouth daily. Patient not taking: Reported on 06/02/2017 11/19/16   Regalado, Jerald Kief A, MD  furosemide (LASIX) 20 MG tablet Take 20 mg by mouth daily with breakfast.     [provider]  gabapentin (NEURONTIN) 400 MG capsule Take 400 mg by mouth 3 (three) times daily as needed (for nerve pain).     [provider]  levothyroxine (SYNTHROID, LEVOTHROID) 75 MCG tablet Take 75 mcg by mouth daily before breakfast.  08/28/16   [provider]  potassium chloride SA (K-DUR,KLOR-CON) 20 MEQ tablet Take 40 mEq by mouth daily.     [provider]  QUEtiapine (SEROQUEL) 25 MG tablet Take 1 tablet (25 mg total) by mouth at bedtime. Patient taking differently: Take 25-50 mg by mouth at bedtime.  11/18/16   Regalado, Belkys A, MD  simvastatin (ZOCOR) 10 MG tablet Take 10 mg by mouth at bedtime. 08/09/16   [provider]  thiamine 100 MG tablet Take 1 tablet (100 mg total) by mouth daily. Patient not taking: Reported on 06/02/2017 11/19/16   Niel Hummer A, MD  venlafaxine XR (EFFEXOR-XR) 150 MG 24 hr capsule Take 150 mg by mouth daily. 02/18/17   [provider]  zolpidem (AMBIEN) 10  MG tablet Take 10 mg by mouth at bedtime. 02/14/17   [provider]    Family History Family History  Problem Relation Age of Onset  . Heart disease Mother   . Cancer Father        lung    Social History Social History  Substance Use Topics  . Smoking status: Never Smoker  . Smokeless tobacco: Never Used  . Alcohol use No     Comment: rare     Allergies   Tetanus toxoids and Yellow dyes (non-tartrazine)   Review of Systems Review of Systems  All other systems reviewed and are negative.    Physical Exam Updated Vital Signs BP (!) 183/98   Pulse (!) 109   Temp 98.4 F (36.9 C) (Oral)   Resp (!) 22   Ht 5\' 1"  (1.549 m)   Wt 68 kg (150 lb)   SpO2 100%   BMI 28.34 kg/m   Physical Exam  Constitutional: She appears well-developed and well-nourished.  HENT:  Head: Normocephalic.  Abrasion to left cheek with mild tenderness to palpation. Dry mucous membranes   Eyes: Pupils are equal, round, and reactive to light. EOM are normal.  Neck:  No C-spine tenderness to palpation  Cardiovascular: Regular rhythm.   No murmur heard. Tachycardic  Pulmonary/Chest: Effort normal and breath sounds normal. No respiratory distress.  Abdominal: Soft. There is no tenderness. There is no rebound and no guarding.  Musculoskeletal: She exhibits no edema or tenderness.  Multiple contusions to all 4 extremities  Neurological:  Disoriented to time. 4 out of 5 strength in all 4 extremities mild right lower facial droop.  Drowsy but arouses to verbal stimuli.    Skin: Skin is warm and dry.  Psychiatric: She has a normal mood and affect. Her behavior is normal.  Nursing note and vitals reviewed.    ED Treatments / Results  Labs (all labs ordered are listed, but only abnormal results are displayed) Labs Reviewed  COMPREHENSIVE METABOLIC PANEL - Abnormal; Notable for the following:       Result Value   CO2 19 (*)    Glucose, Bld 120 (*)  Creatinine, Ser 1.13 (*)     Albumin 3.4 (*)    AST 68 (*)    GFR calc non Af Amer 45 (*)    GFR calc Af Amer 53 (*)    All other components within normal limits  CBC WITH DIFFERENTIAL/PLATELET - Abnormal; Notable for the following:    WBC 15.1 (*)    Neutro Abs 13.7 (*)    Lymphs Abs 0.5 (*)    All other components within normal limits  URINALYSIS, ROUTINE W REFLEX MICROSCOPIC - Abnormal; Notable for the following:    Hgb urine dipstick MODERATE (*)    Ketones, ur 5 (*)    Protein, ur 100 (*)    All other components within normal limits  CK - Abnormal; Notable for the following:    Total CK 2,013 (*)    All other components within normal limits  RAPID URINE DRUG SCREEN, HOSP PERFORMED - Abnormal; Notable for the following:    Benzodiazepines POSITIVE (*)    All other components within normal limits  I-STAT CHEM 8, ED - Abnormal; Notable for the following:    Glucose, Bld 121 (*)    All other components within normal limits  URINE CULTURE  CULTURE, BLOOD (ROUTINE X 2)  CULTURE, BLOOD (ROUTINE X 2)  HEMOGLOBIN A1C  I-STAT TROPOININ, ED  I-STAT CG4 LACTIC ACID, ED    EKG  EKG Interpretation None       Radiology Dg Chest 2 View  Result Date: 06/26/2017 CLINICAL DATA:  Fall, altered mental status EXAM: CHEST  2 VIEW COMPARISON:  03/11/2017 FINDINGS: Mild cardiomegaly without CHF, focal pneumonia, collapse, or consolidation. No effusion or pneumothorax. Trachea is midline. Postop changes of the thoracolumbar spine, left clavicle and left shoulder. Monitor leads overlie the chest. Remote cholecystectomy noted. IMPRESSION: Cardiomegaly without acute chest process Electronically Signed   By: Jerilynn Mages.  Shick M.D.   On: 06/26/2017 12:22   Ct Head Wo Contrast  Result Date: 06/26/2017 CLINICAL DATA:  Slurred speech. Possible right facial droop. Found on the floor this morning at 3 a.m. EXAM: CT HEAD WITHOUT CONTRAST CT CERVICAL SPINE WITHOUT CONTRAST TECHNIQUE: Multidetector CT imaging of the head and cervical spine  was performed following the standard protocol without intravenous contrast. Multiplanar CT image reconstructions of the cervical spine were also generated. COMPARISON:  Brain MR dated 11/11/2016. Head CT dated 11/10/2016 and cervical spine CT dated 09/06/2016. FINDINGS: CT HEAD FINDINGS Brain: Diffusely enlarged ventricles and subarachnoid spaces. Patchy white matter low density in both cerebral hemispheres. No intracranial hemorrhage, mass lesion or CT evidence of acute infarction. Vascular: No hyperdense vessel or unexpected calcification. Skull: Normal. Negative for fracture or focal lesion. Sinuses/Orbits: Small amount of fluid in the left maxillary sinus. Unremarkable orbits. Other: None. CT CERVICAL SPINE FINDINGS Alignment: Normal. Skull base and vertebrae: No acute fracture. No primary bone lesion or focal pathologic process. Soft tissues and spinal canal: No prevertebral fluid or swelling. No visible canal hematoma. Disc levels: Stable disc space narrowing and mild anterior and minimal posterior spur formation at the C5-6 and C6-7 levels. Facet degenerative changes at multiple levels. Upper chest: Stable mild patchy interstitial prominence at both lung apices. Other: Post carotid endarterectomy changes on the left and mild right carotid artery calcifications. IMPRESSION: 1. No skull fracture or intracranial hemorrhage. 2. No cervical spine fracture or subluxation. 3. Stable mild diffuse cerebral and cerebellar atrophy and mild chronic small vessel white matter ischemic changes in both cerebral hemispheres. 4. Stable cervical spine degenerative  changes. 5. Mild right carotid artery atheromatous calcifications. 6. Stable mild chronic interstitial lung disease. Electronically Signed   By: Claudie Revering M.D.   On: 06/26/2017 12:15   Ct Cervical Spine Wo Contrast  Result Date: 06/26/2017 CLINICAL DATA:  Slurred speech. Possible right facial droop. Found on the floor this morning at 3 a.m. EXAM: CT HEAD  WITHOUT CONTRAST CT CERVICAL SPINE WITHOUT CONTRAST TECHNIQUE: Multidetector CT imaging of the head and cervical spine was performed following the standard protocol without intravenous contrast. Multiplanar CT image reconstructions of the cervical spine were also generated. COMPARISON:  Brain MR dated 11/11/2016. Head CT dated 11/10/2016 and cervical spine CT dated 09/06/2016. FINDINGS: CT HEAD FINDINGS Brain: Diffusely enlarged ventricles and subarachnoid spaces. Patchy white matter low density in both cerebral hemispheres. No intracranial hemorrhage, mass lesion or CT evidence of acute infarction. Vascular: No hyperdense vessel or unexpected calcification. Skull: Normal. Negative for fracture or focal lesion. Sinuses/Orbits: Small amount of fluid in the left maxillary sinus. Unremarkable orbits. Other: None. CT CERVICAL SPINE FINDINGS Alignment: Normal. Skull base and vertebrae: No acute fracture. No primary bone lesion or focal pathologic process. Soft tissues and spinal canal: No prevertebral fluid or swelling. No visible canal hematoma. Disc levels: Stable disc space narrowing and mild anterior and minimal posterior spur formation at the C5-6 and C6-7 levels. Facet degenerative changes at multiple levels. Upper chest: Stable mild patchy interstitial prominence at both lung apices. Other: Post carotid endarterectomy changes on the left and mild right carotid artery calcifications. IMPRESSION: 1. No skull fracture or intracranial hemorrhage. 2. No cervical spine fracture or subluxation. 3. Stable mild diffuse cerebral and cerebellar atrophy and mild chronic small vessel white matter ischemic changes in both cerebral hemispheres. 4. Stable cervical spine degenerative changes. 5. Mild right carotid artery atheromatous calcifications. 6. Stable mild chronic interstitial lung disease. Electronically Signed   By: Claudie Revering M.D.   On: 06/26/2017 12:15    Procedures Procedures (including critical care  time)  Medications Ordered in ED Medications  amLODipine (NORVASC) tablet 5 mg (not administered)  atenolol (TENORMIN) tablet 50 mg (not administered)  venlafaxine XR (EFFEXOR-XR) 24 hr capsule 150 mg (not administered)  aspirin EC tablet 81 mg (not administered)  acetaminophen (TYLENOL) CR tablet 650 mg (not administered)  QUEtiapine (SEROQUEL) tablet 25 mg (not administered)  pantoprazole (PROTONIX) EC tablet 40 mg (not administered)  levothyroxine (SYNTHROID, LEVOTHROID) tablet 75 mcg (not administered)  simvastatin (ZOCOR) tablet 10 mg (not administered)  potassium chloride SA (K-DUR,KLOR-CON) CR tablet 40 mEq (not administered)  furosemide (LASIX) tablet 20 mg (not administered)  acetaminophen (TYLENOL) tablet 650 mg (not administered)    Or  acetaminophen (TYLENOL) suppository 650 mg (not administered)  bisacodyl (DULCOLAX) suppository 10 mg (not administered)  ondansetron (ZOFRAN) tablet 4 mg (not administered)    Or  ondansetron (ZOFRAN) injection 4 mg (not administered)  hydrALAZINE (APRESOLINE) injection 5-10 mg (not administered)  cefTRIAXone (ROCEPHIN) 1 g in dextrose 5 % 50 mL IVPB (not administered)  sodium chloride 0.9 % bolus 500 mL (0 mLs Intravenous Stopped 06/26/17 1411)  sodium chloride 0.9 % bolus 500 mL (500 mLs Intravenous New Bag/Given 06/26/17 1403)  cefTRIAXone (ROCEPHIN) 1 g in dextrose 5 % 50 mL IVPB (0 g Intravenous Stopped 06/26/17 1433)     Initial Impression / Assessment and Plan / ED Course  I have reviewed the triage vital signs and the nursing notes.  Pertinent labs & imaging results that were available during my care of the  patient were reviewed by me and considered in my medical decision making (see chart for details).  Clinical Course as of Jun 26 1529  Sun Jun 26, 2017  1315 Additional history available from family. They state that she has a history of frequent falls as well as Xanax and Ambien abuse. Her husband states that she had a fall last  night and he attempted to get her off the floor for 2-3 hours. When he is unable to get her off the floor she was given a pillow and blanket and she slept on the floor for the rest tonight. When he awoke at 9 this morning she was still lying on the floor and he was unable to get her up so 911 was called.  [ER]    Clinical Course User Index [ER] Quintella Reichert, MD  Patient here for evaluation of injuries following a fall with altered mental status. Patient is lethargic and dehydrated on examination. She has generalized weakness with mild right lower facial droop, poor effort on examination. CT head and neck are negative for acute abnormalities. Plan to obtain MRI to rule out CVA. Patient is not a TPA candidate given unclear duration of symptoms. Family reports the patient has a history of overtaking her medications. She was treated with IV fluids for dehydration, rhabdomyolysis. Concern for possible UTI given her history and she was given one empiric dose of antibiotics. UA is not consistent with UTI. Hospitalist consulted for admission for further workup and treatment.  Final Clinical Impressions(s) / ED Diagnoses   Final diagnoses:  None    New Prescriptions New Prescriptions   No medications on file     Quintella Reichert, MD 06/26/17 1530

## 2017-06-26 NOTE — Progress Notes (Signed)
Patient arrived to unit.  Transferred from stretcher to bed.  Alert, verbal.  Telemetry applied, verified.

## 2017-06-26 NOTE — H&P (Signed)
History and Physical    [name] [name]:[id number] DOB: 14-Feb-1936 DOA: 06/26/2017   PCP: [name], MD   Patient coming from:  Home    Chief Complaint: Fall and mild confusion  HPI: [name] [name] is a 81 y.o. female with extensive medical history including carotid artery disease s/p CEA 12/2016,  History of  Seizure in 2017 with extensive Neuro and Psychiatric workup, likely due to benzo withdrawal Anxiety with sedative habituation, brought by EMS after unwitnessed fall. In review, she was in her usual state of health until about 3 am, when she was found on the floor by her husband, face down. He tried to pick her up but could not do it on his own, covered her on a blanket and pillows. As she did not get up he called EMS around 10 to bring her into the ER. She was mildly confuse to time upon arrival. She does not recall the event. No apparent seizures per husband report. She denies any dysphagia, or vision changes. Her daughter noted a mild R facial droop. No unilateral weakness or decreased sensation. Denies shortness of breath or chest pain. No history of syncope, presyncope. SHe was covered in urine but she is incontinent. She denies any pain. Her most recent infection was EColi/Klebsiella UTI 1 month before. Denies any recent ETOH or cocaine or marijuana. No tobacco   ED Course:  BP (!) 188/84   Pulse 97   Temp 98.4 F (36.9 C) (Oral)   Resp 19   Ht 5\' 1"  (1.549 m)   Wt 68 kg (150 lb)   SpO2 100%   BMI 28.34 kg/m    UA negative  glucose 121 creatinine 1 troponin 0.04 lactic acid 1.17 CK 2013 white count 15.1 hemoglobin 13.3 platelets 226 UDS + BZD Received 1 L IVF and Rocephin IV   Review of Systems:  As per HPI otherwise all other systems reviewed and are negative  Past Medical History:  Diagnosis Date  . Alopecia   . Anemia    hx of years ago   . Anxiety   . ARF (acute renal failure) ([place]) 08/23/2014  . Arthritis    psoriatic arthritis  . Back pain   . Blood  transfusion    at age of 4  . Carotid stenosis 12/2016  . Chronic diastolic CHF (congestive heart failure) ([place])   . Depression   . Dysrhythmia 11/13/2016   PAFib for a short time- Echo done 11/14/16 PAF- to follow up with PCP- to see if she is a candiate for anticoags.  Not started at times time due to alcohol abuse.  [name] GERD (gastroesophageal reflux disease)   . H/O hiatal hernia   . History of acute cholangitis   . Hyperlipidemia   . Hypertension   . Hypothyroidism   . IBS (irritable bowel syndrome)   . Pancreatitis   . Peripheral vascular disease ([place])   . PONV (postoperative nausea and vomiting)    with Breast reduction- only time  . Rosacea   . Seizures ([place]) 11/13/2016   Thopught to be from withdrawl Benzodiazepine  . Stroke [facility])    patient denies-  . Thyroid disease     Past Surgical History:  Procedure Laterality Date  . ABDOMINAL HYSTERECTOMY     partial  . BACK SURGERY  2010   thoractic-screws and rods  . BILE DUCT STENT PLACEMENT  08/26/2014  . BREAST REDUCTION SURGERY    . CHOLECYSTECTOMY  1988   lap  .  COLONOSCOPY    . COLONOSCOPY Left 09/08/2014   Procedure: COLONOSCOPY;  Surgeon: Arta Silence, MD;  Location: [facility] ENDOSCOPY;  Service: Endoscopy;  Laterality: Left;  . ENDARTERECTOMY Left 12/28/2016  . ENDARTERECTOMY Left 12/28/2016   Procedure: ENDARTERECTOMY CAROTID LEFT WITH XENOSURE BIOLOGIC PATCH ANGIOPLASTY;  Surgeon: Angelia Mould, MD;  Location: [place];  Service: Vascular;  Laterality: Left;  . ERCP    . ERCP N/A 08/26/2014   Procedure: ENDOSCOPIC RETROGRADE CHOLANGIOPANCREATOGRAPHY (ERCP);  Surgeon: Jeryl Columbia, MD;  Location: [facility] ENDOSCOPY;  Service: Endoscopy;  Laterality: N/A;  . ERCP N/A 09/11/2014   Procedure: ENDOSCOPIC RETROGRADE CHOLANGIOPANCREATOGRAPHY (ERCP);  Surgeon: Arta Silence, MD;  Location: [facility] ENDOSCOPY;  Service: Endoscopy;  Laterality: N/A;  . EUS N/A 09/11/2014   Procedure: ESOPHAGEAL ENDOSCOPIC ULTRASOUND (EUS) RADIAL;   Surgeon: Arta Silence, MD;  Location: [facility] ENDOSCOPY;  Service: Endoscopy;  Laterality: N/A;  . EYE SURGERY Bilateral    Cataract  . JOINT REPLACEMENT  2005   left shoulder replacement   . ORIF CLAVICULAR FRACTURE Left 07/18/2014   Procedure: OPEN REDUCTION INTERNAL FIXATION (ORIF) LEFT CLAVICULAR FRACTURE;  Surgeon: Ninetta Lights, MD;  Location: [place];  Service: Orthopedics;  Laterality: Left;  . SPYGLASS CHOLANGIOSCOPY N/A 09/11/2014   Procedure: SPYGLASS CHOLANGIOSCOPY;  Surgeon: Arta Silence, MD;  Location: [facility] ENDOSCOPY;  Service: Endoscopy;  Laterality: N/A;  . TONSILLECTOMY  as child  . TOTAL KNEE ARTHROPLASTY Left 03/10/2016   Procedure: LEFT TOTAL KNEE ARTHROPLASTY;  Surgeon: Ninetta Lights, MD;  Location: [place];  Service: Orthopedics;  Laterality: Left;  [name] VENTRAL HERNIA REPAIR  06/08/2012   Procedure: LAPAROSCOPIC VENTRAL HERNIA;  Surgeon: Adin Hector, MD;  Location: [facility] ORS;  Service: General;  Laterality: Right;    Social History Social History   Social History  . Marital status: Married    Spouse name: N/A  . Number of children: N/A  . Years of education: N/A   Occupational History  . Not on file.   Social History Main Topics  . Smoking status: Never Smoker  . Smokeless tobacco: Never Used  . Alcohol use No     Comment: rare  . Drug use: Yes    Types: Benzodiazepines  . Sexual activity: Not Currently   Other Topics Concern  . Not on file   Social History Narrative  . No narrative on file     Allergies  Allergen Reactions  . Tetanus Toxoids Swelling    Arm (site) became swollen as a child  . Yellow Dyes (Non-Tartrazine) Itching and Other (See Comments)    Makes patient nervous     Family History  Problem Relation Age of Onset  . Heart disease Mother   . Cancer Father        lung      Prior to Admission medications   Medication Sig Start Date End Date Taking? Authorizing Provider  acetaminophen (TYLENOL 8 HOUR ARTHRITIS  PAIN) 650 MG CR tablet Take 1,300 mg by mouth every 8 (eight) hours as needed for pain (or headaches).     [provider]  ALPRAZolam Duanne Moron) 1 MG tablet Take 1 mg by mouth 4 (four) times daily as needed for anxiety.     [provider]  amLODipine (NORVASC) 5 MG tablet Take 1 tablet (5 mg total) by mouth daily. 03/16/17   Velvet Bathe, MD  aspirin EC 81 MG tablet Take 81 mg by mouth daily.    [provider]  aspirin-acetaminophen-caffeine (  EXCEDRIN MIGRAINE) 250-250-65 MG tablet Take 1-2 tablets by mouth every 6 (six) hours as needed for headache.    [provider]  atenolol (TENORMIN) 50 MG tablet Take 1 tablet (50 mg total) by mouth daily. 03/16/17   Velvet Bathe, MD  diltiazem (CARDIZEM CD) 180 MG 24 hr capsule Take 1 capsule (180 mg total) by mouth daily. Patient not taking: Reported on 06/02/2017 11/19/16   Regalado, Jerald Kief A, MD  esomeprazole (NEXIUM) 40 MG capsule Take 40 mg by mouth daily. 08/31/16   [provider]  folic acid (FOLVITE) 1 MG tablet Take 1 tablet (1 mg total) by mouth daily. Patient not taking: Reported on 06/02/2017 11/19/16   Regalado, Jerald Kief A, MD  furosemide (LASIX) 20 MG tablet Take 20 mg by mouth daily with breakfast.     [provider]  gabapentin (NEURONTIN) 400 MG capsule Take 400 mg by mouth 3 (three) times daily as needed (for nerve pain).     [provider]  levothyroxine (SYNTHROID, LEVOTHROID) 75 MCG tablet Take 75 mcg by mouth daily before breakfast.  08/28/16   [provider]  potassium chloride SA (K-DUR,KLOR-CON) 20 MEQ tablet Take 40 mEq by mouth daily.     [provider]  QUEtiapine (SEROQUEL) 25 MG tablet Take 1 tablet (25 mg total) by mouth at bedtime. Patient taking differently: Take 25-50 mg by mouth at bedtime.  11/18/16   Regalado, Belkys A, MD  simvastatin (ZOCOR) 10 MG tablet Take 10 mg by mouth at bedtime. 08/09/16   [provider]  thiamine 100 MG tablet  Take 1 tablet (100 mg total) by mouth daily. Patient not taking: Reported on 06/02/2017 11/19/16   Niel Hummer A, MD  venlafaxine XR (EFFEXOR-XR) 150 MG 24 hr capsule Take 150 mg by mouth daily. 02/18/17   [provider]  zolpidem (AMBIEN) 10 MG tablet Take 10 mg by mouth at bedtime. 02/14/17   [provider]    Physical Exam:  Vitals:   06/26/17 1322 06/26/17 1324 06/26/17 1345 06/26/17 1415  BP: (!) 166/85  (!) 171/80 (!) 188/84  Pulse: (!) 103  (!) 104 97  Resp: 16  (!) 21 19  Temp: 98.4 F (36.9 C)     TempSrc: Oral     SpO2: 100%  98% 100%  Weight:  68 kg (150 lb)    Height: 5\' 1"  (1.549 m)      Constitutional: NAD, calm, comfortable, but lethargic Eyes: PERRL, lids and conjunctivae normal ENMT: Mucous membranes are dry, without exudate or lesions   L cheek abrasion, mildly TTP Neck: normal, supple, no masses, no thyromegaly. No tenderness Respiratory: clear to auscultation bilaterally, no wheezing, no crackles. Normal respiratory effort  Cardiovascular:  Mildly tachy rate and rhythm, no murmurs, rubs or gallops. No extremity edema. 2+ pedal pulses. No carotid bruits.  Abdomen: Soft, non tender, No hepatosplenomegaly. Bowel sounds positive.  Musculoskeletal: no clubbing / cyanosis. Moves all extremities Skin: abrasion in cheek, left lip, and diffuesly around the body there are some areas of bruising  Neurologic: Sensation intact  Strength equal in all extremities 4/4, mild R droop Psychiatric:   Alert and oriented x 2, not to time . Normal mood.     Labs on Admission: I have personally reviewed following labs and imaging studies  CBC:  Recent Labs Lab 06/26/17 1143 06/26/17 1204  WBC 15.1*  --   NEUTROABS 13.7*  --   HGB 12.9 13.3  HCT 39.5 39.0  MCV  85.9  --   PLT 226  --     Basic Metabolic Panel:  Recent Labs Lab 06/26/17 1143 06/26/17 1204  NA 142 144  K 3.9 3.8  CL 111 110  CO2 19*  --   GLUCOSE 120* 121*  BUN 14 16    CREATININE 1.13* 1.00  CALCIUM 9.2  --     GFR: Estimated Creatinine Clearance: 40.9 mL/min (by C-G formula based on SCr of 1 mg/dL).  Liver Function Tests:  Recent Labs Lab 06/26/17 1143  AST 68*  ALT 36  ALKPHOS 96  BILITOT 1.2  PROT 6.5  ALBUMIN 3.4*   No results for input(s): LIPASE, AMYLASE in the last 168 hours. No results for input(s): AMMONIA in the last 168 hours.  Coagulation Profile: No results for input(s): INR, PROTIME in the last 168 hours.  Cardiac Enzymes:  Recent Labs Lab 06/26/17 1143  CKTOTAL 2,013*    BNP (last 3 results) No results for input(s): PROBNP in the last 8760 hours.  HbA1C: No results for input(s): HGBA1C in the last 72 hours.  CBG: No results for input(s): GLUCAP in the last 168 hours.  Lipid Profile: No results for input(s): CHOL, HDL, LDLCALC, TRIG, CHOLHDL, LDLDIRECT in the last 72 hours.  Thyroid Function Tests: No results for input(s): TSH, T4TOTAL, FREET4, T3FREE, THYROIDAB in the last 72 hours.  Anemia Panel: No results for input(s): VITAMINB12, FOLATE, FERRITIN, TIBC, IRON, RETICCTPCT in the last 72 hours.  Urine analysis:    Component Value Date/Time   COLORURINE YELLOW 06/26/2017 1322   APPEARANCEUR CLEAR 06/26/2017 1322   LABSPEC 1.014 06/26/2017 1322   PHURINE 5.0 06/26/2017 1322   GLUCOSEU NEGATIVE 06/26/2017 1322   HGBUR MODERATE (A) 06/26/2017 1322   BILIRUBINUR NEGATIVE 06/26/2017 1322   KETONESUR 5 (A) 06/26/2017 1322   PROTEINUR 100 (A) 06/26/2017 1322   UROBILINOGEN 0.2 09/04/2014 1340   NITRITE NEGATIVE 06/26/2017 1322   LEUKOCYTESUR NEGATIVE 06/26/2017 1322    Sepsis Labs: @LABRCNTIP (procalcitonin:4,lacticidven:4) )No results found for this or any previous visit (from the past 240 hour(s)).   Radiological Exams on Admission: Dg Chest 2 View  Result Date: 06/26/2017 CLINICAL DATA:  Fall, altered mental status EXAM: CHEST  2 VIEW COMPARISON:  03/11/2017 FINDINGS: Mild cardiomegaly without  CHF, focal pneumonia, collapse, or consolidation. No effusion or pneumothorax. Trachea is midline. Postop changes of the thoracolumbar spine, left clavicle and left shoulder. Monitor leads overlie the chest. Remote cholecystectomy noted. IMPRESSION: Cardiomegaly without acute chest process Electronically Signed   By: Jerilynn Mages.  Shick M.D.   On: 06/26/2017 12:22   Ct Head Wo Contrast  Result Date: 06/26/2017 CLINICAL DATA:  Slurred speech. Possible right facial droop. Found on the floor this morning at 3 a.m. EXAM: CT HEAD WITHOUT CONTRAST CT CERVICAL SPINE WITHOUT CONTRAST TECHNIQUE: Multidetector CT imaging of the head and cervical spine was performed following the standard protocol without intravenous contrast. Multiplanar CT image reconstructions of the cervical spine were also generated. COMPARISON:  Brain MR dated 11/11/2016. Head CT dated 11/10/2016 and cervical spine CT dated 09/06/2016. FINDINGS: CT HEAD FINDINGS Brain: Diffusely enlarged ventricles and subarachnoid spaces. Patchy white matter low density in both cerebral hemispheres. No intracranial hemorrhage, mass lesion or CT evidence of acute infarction. Vascular: No hyperdense vessel or unexpected calcification. Skull: Normal. Negative for fracture or focal lesion. Sinuses/Orbits: Small amount of fluid in the left maxillary sinus. Unremarkable orbits. Other: None. CT CERVICAL SPINE FINDINGS Alignment: Normal. Skull base and vertebrae: No acute fracture. No  primary bone lesion or focal pathologic process. Soft tissues and spinal canal: No prevertebral fluid or swelling. No visible canal hematoma. Disc levels: Stable disc space narrowing and mild anterior and minimal posterior spur formation at the C5-6 and C6-7 levels. Facet degenerative changes at multiple levels. Upper chest: Stable mild patchy interstitial prominence at both lung apices. Other: Post carotid endarterectomy changes on the left and mild right carotid artery calcifications. IMPRESSION: 1.  No skull fracture or intracranial hemorrhage. 2. No cervical spine fracture or subluxation. 3. Stable mild diffuse cerebral and cerebellar atrophy and mild chronic small vessel white matter ischemic changes in both cerebral hemispheres. 4. Stable cervical spine degenerative changes. 5. Mild right carotid artery atheromatous calcifications. 6. Stable mild chronic interstitial lung disease. Electronically Signed   By: Claudie Revering M.D.   On: 06/26/2017 12:15   Ct Cervical Spine Wo Contrast  Result Date: 06/26/2017 CLINICAL DATA:  Slurred speech. Possible right facial droop. Found on the floor this morning at 3 a.m. EXAM: CT HEAD WITHOUT CONTRAST CT CERVICAL SPINE WITHOUT CONTRAST TECHNIQUE: Multidetector CT imaging of the head and cervical spine was performed following the standard protocol without intravenous contrast. Multiplanar CT image reconstructions of the cervical spine were also generated. COMPARISON:  Brain MR dated 11/11/2016. Head CT dated 11/10/2016 and cervical spine CT dated 09/06/2016. FINDINGS: CT HEAD FINDINGS Brain: Diffusely enlarged ventricles and subarachnoid spaces. Patchy white matter low density in both cerebral hemispheres. No intracranial hemorrhage, mass lesion or CT evidence of acute infarction. Vascular: No hyperdense vessel or unexpected calcification. Skull: Normal. Negative for fracture or focal lesion. Sinuses/Orbits: Small amount of fluid in the left maxillary sinus. Unremarkable orbits. Other: None. CT CERVICAL SPINE FINDINGS Alignment: Normal. Skull base and vertebrae: No acute fracture. No primary bone lesion or focal pathologic process. Soft tissues and spinal canal: No prevertebral fluid or swelling. No visible canal hematoma. Disc levels: Stable disc space narrowing and mild anterior and minimal posterior spur formation at the C5-6 and C6-7 levels. Facet degenerative changes at multiple levels. Upper chest: Stable mild patchy interstitial prominence at both lung apices.  Other: Post carotid endarterectomy changes on the left and mild right carotid artery calcifications. IMPRESSION: 1. No skull fracture or intracranial hemorrhage. 2. No cervical spine fracture or subluxation. 3. Stable mild diffuse cerebral and cerebellar atrophy and mild chronic small vessel white matter ischemic changes in both cerebral hemispheres. 4. Stable cervical spine degenerative changes. 5. Mild right carotid artery atheromatous calcifications. 6. Stable mild chronic interstitial lung disease. Electronically Signed   By: Claudie Revering M.D.   On: 06/26/2017 12:15    EKG: Independently reviewed.  Assessment/Plan Active Problems:   Fall   Arthritis   Hypertension   GERD (gastroesophageal reflux disease)   Anxiety   IBS (irritable bowel syndrome)   Leukocytosis   Chronic diastolic CHF (congestive heart failure) ([facility])   Hypothyroidism   Altered mental state   Seizure ([place])   Paroxysmal atrial fibrillation ([place])   Fall, in the setting of Anxiolytics and Sedatives, rule out seizures versus infection versus overdose. No apparent syncope. She has a history of PAF but on SR at this time. No history of TIA or stroke.   WBC 15.1 CK 2000. Lactic 1.19  Tn neg  Neuro exam remarkable for patient's lethargy,, but slowly returning to baseline, can follow commands. CT head negative, MRI brain is pending . Received IVF and IV Rocephin x1  Inpatient tele Seizure and fall precautions EEG IV fluids 100 cc/h  EKG   Blood and Urine cultures  Continue Rocephin for now in view of recent infection with Ecoli UTI and Klebsiella UTI  Orthostatics Hold sedatives and anxiolytics or now.  Air overlay mattress OT/PT for deconditioning  Pending  MRI results and EEG may need Neuro evaluation   Hypertension BP   188/84   Pulse 97  Controlled Continue home anti-hypertensive medications in am  Add Hydralazine Q6 hours as needed for BP 035/465  Diastolic CHF: No acute decompensation weight 150 lbs Obtain  daily weights Monitor intake and output Continue with meds in am   Hyperlipidemia Continue home statins   Hypothyroidism: Continue home Synthroid  Anxiety Continue home anxiolytics in view of possible overdose May need Psych eval in view of anxiolytic and sedative dependence  Depression Continue home Effexor  GERD, no acute symptoms Continue PPI  Elevated glucose in blood, no history of DM Check A1C Monitor blood sugar.    DVT prophylaxis:  SCD's   Code Status:   Full   Family Communication:  Discussed with patient Disposition Plan: Expect patient to be discharged to home after condition improves Consults called:   None  Admission status: Inpatient tele   Rondel Jumbo, PA-C [facility]   06/26/2017, 2:42 PM

## 2017-06-26 NOTE — ED Notes (Signed)
Admitting at bedside 

## 2017-06-26 NOTE — ED Notes (Addendum)
Attempted report to Dawn. Will call back.

## 2017-06-26 NOTE — ED Notes (Addendum)
Family at bedside.   Face washed by RN. Some dried blood remains. Small cut noted to left eyebrow.   Pt. Hooked to external catheter for urine specimen. Pt. Educated to urinate for sample.

## 2017-06-26 NOTE — ED Notes (Signed)
Pt in MRI, report given, family in room updated.

## 2017-06-26 NOTE — ED Notes (Signed)
Patient transported to MRI 

## 2017-06-26 NOTE — ED Notes (Signed)
Patient transported to CT on telemetry monitor at this time.

## 2017-06-26 NOTE — ED Notes (Signed)
Rocephin started prior to blood culture orders.

## 2017-06-26 NOTE — ED Notes (Signed)
Pt. repetitive questioning at this time.

## 2017-06-26 NOTE — ED Triage Notes (Signed)
Pt. Coming from home via [facility] for fall. Pt. Husband found her on tile floor at 0300 and did not call EMS. Pt. On plavix. EMS reports pt. Prone naked on tile floor upon arrival and confused. Pt. Cold to touch and shivering. EMS reports slurred speech and possible facial droop on right. Bruising noted all over. EDP at bedside.

## 2017-06-26 NOTE — ED Notes (Signed)
Pt. To xray after CT.

## 2017-06-26 NOTE — ED Notes (Signed)
Pt. Family states they were trying all night to lift her from the floor. They let her sleep there and were going to get neighbors to help them in the morning. Pt. Husband states he woke up at 9am and called EMS because he was still unable to move her.

## 2017-06-26 NOTE — ED Notes (Signed)
Meds not verified, so not given at this time. Will contact pharmacy.

## 2017-06-26 NOTE — ED Notes (Signed)
Pt. Given water with admitting approval.

## 2017-06-26 NOTE — ED Notes (Signed)
Family at bedside. 

## 2017-06-27 ENCOUNTER — Inpatient Hospital Stay (HOSPITAL_COMMUNITY)
Admit: 2017-06-27 | Discharge: 2017-06-27 | Disposition: A | Discharge status: Home / Self Care | Payer: Medicare Other | Attending: Physician Assistant | Admitting: Physician Assistant

## 2017-06-27 DIAGNOSIS — R400 Somnolence: Secondary | ICD-10-CM

## 2017-06-27 DIAGNOSIS — G9340 Encephalopathy, unspecified: Secondary | ICD-10-CM

## 2017-06-27 LAB — GLUCOSE, CAPILLARY: GLUCOSE-CAPILLARY: 214 mg/dL — AB (ref 65–99)

## 2017-06-27 LAB — COMPREHENSIVE METABOLIC PANEL
ALT: 36 U/L (ref 14–54)
ANION GAP: 9 (ref 5–15)
AST: 60 U/L — ABNORMAL HIGH (ref 15–41)
Albumin: 3 g/dL — ABNORMAL LOW (ref 3.5–5.0)
Alkaline Phosphatase: 78 U/L (ref 38–126)
BUN: 10 mg/dL (ref 6–20)
CHLORIDE: 112 mmol/L — AB (ref 101–111)
CO2: 20 mmol/L — AB (ref 22–32)
CREATININE: 0.93 mg/dL (ref 0.44–1.00)
Calcium: 8.4 mg/dL — ABNORMAL LOW (ref 8.9–10.3)
GFR, EST NON AFRICAN AMERICAN: 57 mL/min — AB (ref >60)
Glucose, Bld: 106 mg/dL — ABNORMAL HIGH (ref 65–99)
Potassium: 3.5 mmol/L (ref 3.5–5.1)
SODIUM: 141 mmol/L (ref 135–145)
Total Bilirubin: 0.8 mg/dL (ref 0.3–1.2)
Total Protein: 5.7 g/dL — ABNORMAL LOW (ref 6.5–8.1)

## 2017-06-27 LAB — CBC
HCT: 35.7 % — ABNORMAL LOW (ref 36.0–46.0)
HEMOGLOBIN: 11.3 g/dL — AB (ref 12.0–15.0)
MCH: 27.5 pg (ref 26.0–34.0)
MCHC: 31.7 g/dL (ref 30.0–36.0)
MCV: 86.9 fL (ref 78.0–100.0)
PLATELETS: 189 10*3/uL (ref 150–400)
RBC: 4.11 MIL/uL (ref 3.87–5.11)
RDW: 14.8 % (ref 11.5–15.5)
WBC: 9.6 10*3/uL (ref 4.0–10.5)

## 2017-06-27 LAB — URINE CULTURE: Culture: NO GROWTH

## 2017-06-27 LAB — CK: CK TOTAL: 1877 U/L — AB (ref 38–234)

## 2017-06-27 LAB — PROTIME-INR
INR: 1.01
PROTHROMBIN TIME: 13.3 s (ref 11.4–15.2)

## 2017-06-27 MED ORDER — ENSURE ENLIVE PO LIQD
237.0000 mL | Freq: Two times a day (BID) | ORAL | Status: DC
  Administered 2017-06-27 – 2017-07-02 (×9): 237 mL via ORAL

## 2017-06-27 MED ORDER — SODIUM CHLORIDE 0.9 % IV SOLN
INTRAVENOUS | Status: DC
  Administered 2017-06-27 – 2017-06-29 (×3): via INTRAVENOUS
  Administered 2017-06-30: 1000 mL via INTRAVENOUS

## 2017-06-27 NOTE — Progress Notes (Signed)
OT Cancellation Note  Patient Details Name: ABBIEGAIL LANDGREN MRN: [id number] DOB: 1936/03/01   Cancelled Treatment:    Reason Eval/Treat Not Completed: Patient at procedure or test/ unavailable  [place], OT/L  [phone] 06/27/2017 06/27/2017, 11:38 AM

## 2017-06-27 NOTE — Progress Notes (Signed)
On call paged regarding patient blood pressure.

## 2017-06-27 NOTE — Progress Notes (Signed)
Patient sitting up in recliner chair.  Fed self after set up.  Alert, more verbal.  Patient alert, oriented times 4.

## 2017-06-27 NOTE — Progress Notes (Signed)
Initial Nutrition Assessment  DOCUMENTATION CODES:   Obesity unspecified  INTERVENTION:    Ensure Enlive po BID, each supplement provides 350 kcal and 20 grams of protein  NUTRITION DIAGNOSIS:   Inadequate oral intake related to lethargy/confusion as evidenced by meal completion < 25%.  GOAL:   Patient will meet greater than or equal to 90% of their needs  MONITOR:   PO intake, Supplement acceptance  REASON FOR ASSESSMENT:   Malnutrition Screening Tool   ASSESSMENT:   Pt with PMH of carotid stenosis, diastolic CHF, depression, HTN, HLD, pancreatitis, and IBS. Admitted for fall in the setting of anxiolytics and sedatives.   Pt currently on a Heart Healthy diet, consuming only bites per meal. Per RN, pt is having difficulties with self-feeding due to tremors. RN assisted with lunch today and pt tolerated about 25%. Pt confused upon interview, states she does not want to eat until the EGD are resulted.  RN states she will talk to MD about OT consultation for self feeding difficulties. Pt reports having a great appetite at home, consuming 2-3 meals per day prior to admission. Pt reports UBW of 150 lbs, an unknown time frame of being at that wt last, and no unintentional wt loss. Records show a wt increase of 26 lbs since April 2018, though hard to tell if it's dry wt gain compared to fluid accumulation. Nutrition-Focused physical exam completed. Findings are no fat depletion, no muscle depletion, and mild edema in BLE. Will provide supplementation for decreased PO intake.     Medications reviewed and include: lasix, KCl, IV abx Labs reviewed: AST 60 (H), CBG 106-121  Diet Order:  Diet Heart Room service appropriate? Yes; Fluid consistency: Thin  Skin:  Reviewed, no issues  Last BM:  06/27/2017  Height:   Ht Readings from Last 1 Encounters:  06/26/17 5\' 1"  (1.549 m)    Weight:   Wt Readings from Last 1 Encounters:  06/27/17 164 lb 14.5 oz (74.8 kg)    Ideal Body  Weight:  47.7 kg  BMI:  Body mass index is 31.16 kg/m.  Estimated Nutritional Needs:   Kcal:  1300-1500 (MSJ)  Protein:  75-85 grams (1-1.1 g/kg)  Fluid:  >1.3 L/day  EDUCATION NEEDS:   No education needs identified at this time  [place], LDN Pager # - [phone]

## 2017-06-27 NOTE — Procedures (Signed)
ELECTROENCEPHALOGRAM REPORT  Date of Study: 06/27/2017  Patient's Name: [name] [name] MRN: [id number] Date of Birth: [birth year]  Referring Provider: Rondel Jumbo, PA-C  Clinical History: 81 year old female with history of seizure in 2017 presents confused status post fall.  Medications: acetaminophen (TYLENOL) tablet 650 mg amLODipine (NORVASC) tablet 5 mg  aspirin EC tablet 81 mg  atenolol (TENORMIN) tablet 50 mg  bisacodyl (DULCOLAX) suppository 10 mg  cefTRIAXone (ROCEPHIN) 1 g in dextrose 5 % 50 mL IVPB  furosemide (LASIX) tablet 20 mg  levothyroxine (SYNTHROID, LEVOTHROID) tablet 75 mcg  ondansetron (ZOFRAN) tablet 4 mg  pantoprazole (PROTONIX) EC tablet 40 mg  potassium chloride SA (K-DUR,KLOR-CON) CR tablet 40 mEq  QUEtiapine (SEROQUEL) tablet 25 mg  simvastatin (ZOCOR) tablet 10 mg  venlafaxine XR (EFFEXOR-XR) 24 hr capsule 150 mg  Technical Summary: A multichannel digital EEG recording measured by the international 10-20 system with electrodes applied with paste and impedances below 5000 ohms performed as portable with EKG monitoring in an awake and patient.  Hyperventilation was not performed.  Photic stimulation was performed.  The digital EEG was referentially recorded, reformatted, and digitally filtered in a variety of bipolar and referential montages for optimal display.   Description: The patient is awake during the recording.  During maximal wakefulness, there is a symmetric, medium voltage 8 Hz posterior dominant rhythm that attenuates with eye opening. This is admixed with diffuse 6-7 Hz theta slowing of the waking background.  Stage 2 sleep was not seen.  Photic stimulation did not elicit any abnormalities.  There were no epileptiform discharges or electrographic seizures seen.    EKG lead was unremarkable.  Impression: This awake EEG is abnormal due to mild diffuse slowing of the waking background.  Clinical Correlation of the above findings indicates  diffuse cerebral dysfunction that is non-specific in etiology and can be seen with hypoxic/ischemic injury, toxic/metabolic encephalopathies, neurodegenerative disorders, or medication effect.  The absence of epileptiform discharges does not rule out a clinical diagnosis of epilepsy.  Clinical correlation is advised.  Metta Clines, DO

## 2017-06-27 NOTE — Plan of Care (Signed)
Problem: Health Behavior/Discharge Planning: Goal: Compliance with prescribed medication regimen will improve Outcome: Progressing expressive asphasia inproving

## 2017-06-27 NOTE — Progress Notes (Signed)
Occupational Therapy Evaluation Patient Details Name: [name] [name] MRN: [id number] DOB: [birth year] Today's Date: 06/27/2017    History of Present Illness 81 y.o. female with extensive medical history including carotid artery disease s/p CEA 12/2016,  History of  Seizure in 2017 with extensive Neuro and Psychiatric workup, likely due to benzo withdrawal Anxiety with sedative habituation, brought by EMS after unwitnessed fall - found on the floor by her husband, face down. He tried to pick her up but could not do it on his own, covered her on a blanket and pillows. Husband eventaully called 911.   Clinical Impression   PTA, pt lived at home with her husband and states she was independent with ADL and mobility with occasional use of RW - unsure of accuracy. Pt currently requires max A with limited mobility to recliner and ADL. Pt requires mod A to feed self due to UE weakness. Pt states that she has not eaten much lately because she "had a hard time getting food". Pt continued to say "I'm just so weak". Pt is not safe to DC home and will benefit from rehab at SNF to maximize functional level of independence. Unsure of husband's ability to care for his wife. Will follow acutely to address established goals and facilitate safe DC to next venue of care.     Follow Up Recommendations  SNF;Supervision/Assistance - 24 hour    Equipment Recommendations  Other (comment) (TBA at SNF)    Recommendations for Other Services       Precautions / Restrictions Precautions Precautions: Fall      Mobility Bed Mobility Overal bed mobility: Needs Assistance Bed Mobility: Supine to Sit     Supine to sit: Max assist        Transfers Overall transfer level: Needs assistance   Transfers: Sit to/from Stand;Stand Pivot Transfers Sit to Stand: Mod assist Stand pivot transfers: Max assist       General transfer comment: Pt appeared extremely anxious aoubt transferring to chair    Balance Overall  balance assessment: Needs assistance;History of Falls   Sitting balance-Leahy Scale: Poor       Standing balance-Leahy Scale: Poor                             ADL either performed or assessed with clinical judgement   ADL Overall ADL's : Needs assistance/impaired Eating/Feeding: Moderate assistance Eating/Feeding Details (indicate cue type and reason): pt unablet o bring food to mouth due to apparent weakness Grooming: Maximal assistance   Upper Body Bathing: Maximal assistance;Sitting   Lower Body Bathing: Maximal assistance;Sit to/from stand   Upper Body Dressing : Maximal assistance   Lower Body Dressing: Total assistance;Sit to/from stand   Toilet Transfer: Maximal assistance   Toileting- Clothing Manipulation and Hygiene: Total assistance Toileting - Clothing Manipulation Details (indicate cue type and reason): incontinenet of urine. When asked if she knew she had  "an accident". Pt repleid and said "yes, but they told me not to get out of bed."      Functional mobility during ADLs: Maximal assistance       Vision         Perception     Praxis      Pertinent Vitals/Pain Pain Assessment: Faces Faces Pain Scale: Hurts little more Pain Location: "all over" Pain Descriptors / Indicators: Discomfort;Grimacing Pain Intervention(s): Limited activity within patient's tolerance     Hand Dominance Right   Extremity/Trunk Assessment Upper  Extremity Assessment Upper Extremity Assessment: Generalized weakness   Lower Extremity Assessment Lower Extremity Assessment: Defer to PT evaluation   Cervical / Trunk Assessment Cervical / Trunk Assessment: Kyphotic   Communication Communication Communication: HOH   Cognition Arousal/Alertness: Awake/alert Behavior During Therapy: Flat affect;Anxious Overall Cognitive Status: Difficult to assess                                 General Comments: Pt with apparent cognitive deficits, however  unsure what is her baseline. Pt oriented to place, month, year and situation.    General Comments       Exercises     Shoulder Instructions      Home Living Family/patient expects to be discharged to:: Skilled nursing facility Living Arrangements: Spouse/significant other                               Additional Comments: Unsure of husbnad's ability to care for pt. Pt states she has not eaten becuase she was unable to get to food.      Prior Functioning/Environment Level of Independence: Independent        Comments: Pt reports whe was not using a RW and was completing her own self care        OT Problem List: Decreased strength;Decreased activity tolerance;Impaired balance (sitting and/or standing);Decreased safety awareness;Decreased knowledge of use of DME or AE;Pain      OT Treatment/Interventions: Self-care/ADL training;Therapeutic exercise;DME and/or AE instruction;Therapeutic activities;Cognitive remediation/compensation;Patient/family education;Balance training    OT Goals(Current goals can be found in the care plan section) Acute Rehab OT Goals Patient Stated Goal: to get stronger OT Goal Formulation: With patient Time For Goal Achievement: 07/11/17 Potential to Achieve Goals: Good ADL Goals Pt Will Perform Eating: with supervision;with set-up;sitting Pt Will Perform Grooming: with supervision;with set-up;sitting Pt Will Perform Upper Body Bathing: with set-up;with supervision;sitting Pt Will Transfer to Toilet: with min assist;stand pivot transfer;bedside commode Additional ADL Goal #1: Complte bed mobility with min A in preparation for ADL tasks  OT Frequency: Min 2X/week   Barriers to D/C: Decreased caregiver support          Co-evaluation              AM-PAC PT "6 Clicks" Daily Activity     Outcome Measure Help from another person eating meals?: A Lot Help from another person taking care of personal grooming?: A Lot Help from  another person toileting, which includes using toliet, bedpan, or urinal?: A Lot Help from another person bathing (including washing, rinsing, drying)?: A Lot Help from another person to put on and taking off regular upper body clothing?: A Lot Help from another person to put on and taking off regular lower body clothing?: A Lot 6 Click Score: 12   End of Session Equipment Utilized During Treatment: Gait belt Nurse Communication: Mobility status;Need for lift equipment (may need Stedy)  Activity Tolerance: Patient tolerated treatment well Patient left: in chair;with call bell/phone within reach;with chair alarm set;with family/visitor present  OT Visit Diagnosis: Unsteadiness on feet (R26.81);Repeated falls (R29.6);Muscle weakness (generalized) (M62.81);History of falling (Z91.81)                Time: 6767-2094 OT Time Calculation (min): 32 min Charges:  OT General Charges $OT Visit: 1 Procedure OT Evaluation $OT Eval Moderate Complexity: 1 Procedure OT Treatments $Self Care/Home Management : 8-22 mins  G-CodesMaurie Boettcher, OT/L  [phone] 06/27/2017  Tenleigh Byer,HILLARY 06/27/2017, 2:31 PM

## 2017-06-27 NOTE — Progress Notes (Signed)
[facility]                                                                              Patient Demographics  [name] [name], is a 81 y.o. female, DOB - [birth year], ZOX:[id number]  Admit date - 06/26/2017   Admitting Physician Elwin Mocha, MD  Outpatient Primary MD for the patient is [name], MD  Outpatient specialists:   LOS - 1  days    Chief Complaint  Patient presents with  . 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 Head CT negative, UDS positive for benzodiazepines with leukocytosis of more than 15,000.  Patient admitted to the hospital after initiation of Rocephin in view of recent Escherichia coli and Klebsiella urinary infection with leukocytosis.   Assessment & Plan    Active Problems:   Arthritis   Hypertension   GERD (gastroesophageal reflux disease)   Anxiety   IBS (irritable bowel syndrome)   Leukocytosis   Chronic diastolic CHF (congestive heart failure) (HCC)   Hypothyroidism   Altered mental state   Seizure ([place])   Paroxysmal atrial fibrillation ([place])   Fall  #1. Acute Confusion: No evidence of obvious precipitating factor-? Urinary infection/toxic metabolic etiology CT head negative MRI brain negative EEG -mild diffuse slowing of the waking background -non-specific; toxic/metabolic encephalopathy vs medication effect Hold sedatives and anxiolytics or now.  Doubt infectious etiology-follow cultures of blood and urine and transition to oral antibiotic     #2. Dehydration:  IV rehydration  #3. Fall: Likely related to  deconditioning with dehydration  #4. Rhabdomyolysis: Due to fall/lying on floor overnight IV rehydration with monitoring of electrolytes and renal function  #5 Failure to Thrive in adult: Due to deconditioning Seen by PT/OT - not safe to DC home; will benefit from rehab at SNF to maximize functional level of independence.  #6 Left orbital inferolateral mass: Incidental finding per MRI of 06/26/2017 Out-pt opthamology f/u    #7 Hypertension: Continue anti-hypertensive medication  #8 Chronic Diastolic CHF: No acute decompensation Watch for fluid overload-  daily weights, intake and output    #9 Depression with Anxiety  Continue home Effexor Maximize dose of SSRI as tolerated Consider tapering off benzo; avoidance of sedative-hypnotics- risk of dependence  #10 Hyperglycemia:  No prior history of diabetes mellitus. Possible stress related Check A1c Sliding-scale insulin as needed        Code Status: Full DVT Prophylaxis:  SCD's Family Communication:  Disposition Plan: Home  Time Spent in minutes  30 minutes  Procedures:    Consultants:    Antimicrobials: Rocephin, started 06/26/17   Medications  Scheduled Meds: . amLODipine  5 mg Oral Daily  . aspirin EC  81 mg Oral Daily  . atenolol  50 mg Oral Daily  .  furosemide  20 mg Oral Q breakfast  . levothyroxine  75 mcg Oral QAC breakfast  . pantoprazole  40 mg Oral Daily  . potassium chloride SA  40 mEq Oral Daily  . QUEtiapine  25 mg Oral QHS  . simvastatin  10 mg Oral QHS  . venlafaxine XR  150 mg Oral Q breakfast   Continuous Infusions: . cefTRIAXone (ROCEPHIN)  IV     PRN Meds:.acetaminophen **OR** acetaminophen, bisacodyl, hydrALAZINE, ondansetron **OR** ondansetron (ZOFRAN) IV   Antibiotics   Anti-infectives    Start     Dose/Rate Route Frequency Ordered Stop   06/27/17 1400  cefTRIAXone (ROCEPHIN) 1 g in dextrose 5 % 50 mL IVPB     1 g 100 mL/hr over 30 Minutes Intravenous Every  24 hours 06/26/17 1444     06/26/17 1330  cefTRIAXone (ROCEPHIN) 1 g in dextrose 5 % 50 mL IVPB     1 g 100 mL/hr over 30 Minutes Intravenous  Once 06/26/17 1317 06/26/17 1433        Subjective:   [name] [name] was seen and examined today. H& P reviewed. No acute vents noted overnight.she denies fever or chills. No dysuria. No dizziness or headaches.   Objective:   Vitals:   06/26/17 2200 06/27/17 0123 06/27/17 0502 06/27/17 1045  BP: (!) 179/80 (!) 146/71 (!) 145/66 120/65  Pulse: 98 86 81 70  Resp: 18 20 20 20   Temp: 98.4 F (36.9 C) 98.2 F (36.8 C) 98.2 F (36.8 C) 97.7 F (36.5 C)  TempSrc: Oral Axillary Oral Oral  SpO2: 100% 96% 98% 97%  Weight:   74.8 kg (164 lb 14.5 oz)   Height:        Intake/Output Summary (Last 24 hours) at 06/27/17 1136 Last data filed at 06/26/17 2330  Gross per 24 hour  Intake              790 ml  Output              325 ml  Net              465 ml     Wt Readings from Last 3 Encounters:  06/27/17 74.8 kg (164 lb 14.5 oz)  03/15/17 63 kg (138 lb 14.4 oz)  02/09/17 66.2 kg (146 lb)     Exam  General: NAD  HEENT: NCAT,  PERRL, mildly dry MM  Neck: SUPPLE, (-) JVD  Cardiovascular: RRR, (-) GALLOP, (-) MURMUR  Respiratory: CTA  Gastrointestinal: SOFT, (-) DISTENSION, BS(+), (_) TENDERNESS  Ext: (-) CYANOSIS, (-) EDEMA  Neuro: Alert and Responsive, appropriate, follows commands. Oriented 3   Skin:(-) RASH  Psych:NORMAL AFFECT/MOOD   Data Reviewed:  I have personally reviewed following labs and imaging studies  Micro Results Recent Results (from the past 240 hour(s))  Urine culture     Status: None   Collection Time: 06/26/17  1:20 PM  Result Value Ref Range Status   Specimen Description URINE, CATHETERIZED  Final   Special Requests NONE  Final   Culture NO GROWTH  Final   Report Status 06/27/2017 FINAL  Final    Radiology Reports Dg Chest 2 View  Result Date: 06/26/2017 CLINICAL DATA:  Fall, altered mental  status EXAM: CHEST  2 VIEW COMPARISON:  03/11/2017 FINDINGS: Mild cardiomegaly without CHF, focal pneumonia, collapse, or consolidation. No effusion or pneumothorax. Trachea is midline. Postop changes of the thoracolumbar spine, left clavicle and left shoulder. Monitor leads overlie the chest. Remote cholecystectomy  noted. IMPRESSION: Cardiomegaly without acute chest process Electronically Signed   By: Jerilynn Mages.  Shick M.D.   On: 06/26/2017 12:22   Ct Head Wo Contrast  Result Date: 06/26/2017 CLINICAL DATA:  Slurred speech. Possible right facial droop. Found on the floor this morning at 3 a.m. EXAM: CT HEAD WITHOUT CONTRAST CT CERVICAL SPINE WITHOUT CONTRAST TECHNIQUE: Multidetector CT imaging of the head and cervical spine was performed following the standard protocol without intravenous contrast. Multiplanar CT image reconstructions of the cervical spine were also generated. COMPARISON:  Brain MR dated 11/11/2016. Head CT dated 11/10/2016 and cervical spine CT dated 09/06/2016. FINDINGS: CT HEAD FINDINGS Brain: Diffusely enlarged ventricles and subarachnoid spaces. Patchy white matter low density in both cerebral hemispheres. No intracranial hemorrhage, mass lesion or CT evidence of acute infarction. Vascular: No hyperdense vessel or unexpected calcification. Skull: Normal. Negative for fracture or focal lesion. Sinuses/Orbits: Small amount of fluid in the left maxillary sinus. Unremarkable orbits. Other: None. CT CERVICAL SPINE FINDINGS Alignment: Normal. Skull base and vertebrae: No acute fracture. No primary bone lesion or focal pathologic process. Soft tissues and spinal canal: No prevertebral fluid or swelling. No visible canal hematoma. Disc levels: Stable disc space narrowing and mild anterior and minimal posterior spur formation at the C5-6 and C6-7 levels. Facet degenerative changes at multiple levels. Upper chest: Stable mild patchy interstitial prominence at both lung apices. Other: Post carotid  endarterectomy changes on the left and mild right carotid artery calcifications. IMPRESSION: 1. No skull fracture or intracranial hemorrhage. 2. No cervical spine fracture or subluxation. 3. Stable mild diffuse cerebral and cerebellar atrophy and mild chronic small vessel white matter ischemic changes in both cerebral hemispheres. 4. Stable cervical spine degenerative changes. 5. Mild right carotid artery atheromatous calcifications. 6. Stable mild chronic interstitial lung disease. Electronically Signed   By: Claudie Revering M.D.   On: 06/26/2017 12:15   Ct Cervical Spine Wo Contrast  Result Date: 06/26/2017 CLINICAL DATA:  Slurred speech. Possible right facial droop. Found on the floor this morning at 3 a.m. EXAM: CT HEAD WITHOUT CONTRAST CT CERVICAL SPINE WITHOUT CONTRAST TECHNIQUE: Multidetector CT imaging of the head and cervical spine was performed following the standard protocol without intravenous contrast. Multiplanar CT image reconstructions of the cervical spine were also generated. COMPARISON:  Brain MR dated 11/11/2016. Head CT dated 11/10/2016 and cervical spine CT dated 09/06/2016. FINDINGS: CT HEAD FINDINGS Brain: Diffusely enlarged ventricles and subarachnoid spaces. Patchy white matter low density in both cerebral hemispheres. No intracranial hemorrhage, mass lesion or CT evidence of acute infarction. Vascular: No hyperdense vessel or unexpected calcification. Skull: Normal. Negative for fracture or focal lesion. Sinuses/Orbits: Small amount of fluid in the left maxillary sinus. Unremarkable orbits. Other: None. CT CERVICAL SPINE FINDINGS Alignment: Normal. Skull base and vertebrae: No acute fracture. No primary bone lesion or focal pathologic process. Soft tissues and spinal canal: No prevertebral fluid or swelling. No visible canal hematoma. Disc levels: Stable disc space narrowing and mild anterior and minimal posterior spur formation at the C5-6 and C6-7 levels. Facet degenerative changes at  multiple levels. Upper chest: Stable mild patchy interstitial prominence at both lung apices. Other: Post carotid endarterectomy changes on the left and mild right carotid artery calcifications. IMPRESSION: 1. No skull fracture or intracranial hemorrhage. 2. No cervical spine fracture or subluxation. 3. Stable mild diffuse cerebral and cerebellar atrophy and mild chronic small vessel white matter ischemic changes in both cerebral hemispheres. 4. Stable cervical spine degenerative changes. 5. Mild right carotid  artery atheromatous calcifications. 6. Stable mild chronic interstitial lung disease. Electronically Signed   By: Claudie Revering M.D.   On: 06/26/2017 12:15   Mr Brain Wo Contrast  Result Date: 06/26/2017 CLINICAL DATA:  Unwitnessed fall. Patient found down. Query seizure, infection, versus overdose. EXAM: MRI HEAD WITHOUT CONTRAST TECHNIQUE: Multiplanar, multiecho pulse sequences of the brain and surrounding structures were obtained without intravenous contrast. COMPARISON:  MR brain 11/11/2016.  CT head 06/26/2017. FINDINGS: Brain: No evidence for acute infarction, hemorrhage, mass lesion, hydrocephalus, or extra-axial fluid. Generalized atrophy. Chronic microvascular ischemic change. Vascular: Normal flow voids. Skull and upper cervical spine: Normal marrow signal. Sinuses/Orbits: Negative sinuses. In the inferolateral orbit on the LEFT there is an 11 x 10 x 11 mm rounded mass, which may AP extraconal or may be intimately associated with an extra ocular muscle (inferior oblique close). On CT the lesion is hyperdense. On MR there is no significant restriction. No associated sinus or osseous pathology. Lacrimal glands appear normal. Compared with the prior CT and MR studies, the lesion has grown since 2017. Other: None. IMPRESSION: Atrophy and small vessel disease similar to priors. No acute intracranial findings. LEFT orbital inferolateral mass. This is either extraconal or associated with the muscle. This  represents an unusual lesion, and the differential is wide. Considerations would include unusual thyroid orbitopathy, lymphoma, orbital pseudotumor, metastasis, sarcoidosis, neurofibroma, or meningioma. Recommend ophthalmologic evaluation. It is possible the lesion could be better characterized with sonography. Electronically Signed   By: Staci Righter M.D.   On: 06/26/2017 15:51    Lab Data:  CBC:  Recent Labs Lab 06/26/17 1143 06/26/17 1204 06/27/17 0325  WBC 15.1*  --  9.6  NEUTROABS 13.7*  --   --   HGB 12.9 13.3 11.3*  HCT 39.5 39.0 35.7*  MCV 85.9  --  86.9  PLT 226  --  914   Basic Metabolic Panel:  Recent Labs Lab 06/26/17 1143 06/26/17 1204 06/26/17 1701 06/27/17 0325  NA 142 144  --  141  K 3.9 3.8  --  3.5  CL 111 110  --  112*  CO2 19*  --   --  20*  GLUCOSE 120* 121*  --  106*  BUN 14 16  --  10  CREATININE 1.13* 1.00  --  0.93  CALCIUM 9.2  --   --  8.4*  MG  --   --  1.8  --   PHOS  --   --  4.0  --    GFR: Estimated Creatinine Clearance: 46.1 mL/min (by C-G formula based on SCr of 0.93 mg/dL). Liver Function Tests:  Recent Labs Lab 06/26/17 1143 06/27/17 0325  AST 68* 60*  ALT 36 36  ALKPHOS 96 78  BILITOT 1.2 0.8  PROT 6.5 5.7*  ALBUMIN 3.4* 3.0*   No results for input(s): LIPASE, AMYLASE in the last 168 hours. No results for input(s): AMMONIA in the last 168 hours. Coagulation Profile:  Recent Labs Lab 06/27/17 0325  INR 1.01   Cardiac Enzymes:  Recent Labs Lab 06/26/17 1143 06/27/17 0325  CKTOTAL 2,013* 1,877*   BNP (last 3 results) No results for input(s): PROBNP in the last 8760 hours. HbA1C: No results for input(s): HGBA1C in the last 72 hours. CBG:  Recent Labs Lab 06/27/17 0612  GLUCAP 214*   Lipid Profile: No results for input(s): CHOL, HDL, LDLCALC, TRIG, CHOLHDL, LDLDIRECT in the last 72 hours. Thyroid Function Tests: No results for input(s): TSH, T4TOTAL, FREET4, T3FREE,  THYROIDAB in the last 72  hours. Anemia Panel: No results for input(s): VITAMINB12, FOLATE, FERRITIN, TIBC, IRON, RETICCTPCT in the last 72 hours. Urine analysis:    Component Value Date/Time   COLORURINE YELLOW 06/26/2017 [place] 06/26/2017 1322   LABSPEC 1.014 06/26/2017 1322   PHURINE 5.0 06/26/2017 1322   GLUCOSEU NEGATIVE 06/26/2017 1322   HGBUR MODERATE (A) 06/26/2017 1322   BILIRUBINUR NEGATIVE 06/26/2017 1322   KETONESUR 5 (A) 06/26/2017 1322   PROTEINUR 100 (A) 06/26/2017 1322   UROBILINOGEN 0.2 09/04/2014 1340   NITRITE NEGATIVE 06/26/2017 1322   LEUKOCYTESUR NEGATIVE 06/26/2017 1322     OSEI-BONSU,Jurline Folger M.D. [facility] 06/27/2017, 11:36 AM  Pager: [phone] Between 7am to 7pm - call Pager - [phone]  After 7pm go to [email or web address] - password [email or web address]  Call night coverage person covering after 7pm

## 2017-06-27 NOTE — Progress Notes (Signed)
EEG Completed; Results Pending  

## 2017-06-27 NOTE — Evaluation (Signed)
Physical Therapy Evaluation Patient Details Name: [name] [name] MRN: [id number] DOB: 09-Jan-1936 Today's Date: 06/27/2017   History of Present Illness  81 y.o. female admitted on 06/26/17 aftering being found down on the floor by her husband and unable to get up.  Pt admitted to r/o seizure vs medication interaction vs infection.  Subsequently found to be hypertensive.  Head CT negative, UDS positive for benzodiazepines with leukocytosis.  EEG results pending.  Pt with significant PMH of stroke, PVD, HTN, PAF, chronic diastolic CHF, ARF, anxiety, L TKA, back surgery and ORIF clavicular fx.    Clinical Impression  Pt is very weak and trembling.  She needed mod assist for all mobility and I did not feel safe transferring her back to bed by myself with just a RW, so I used the steady standing frame.  She would benefit from two person assist next session to attempt to stand with RW and progress gait safely.  She would benefit from SNF level rehab at d/c.   PT to follow acutely for deficits listed below.  .    Follow Up Recommendations SNF    Equipment Recommendations  None recommended by PT    Recommendations for Other Services   NA     Precautions / Restrictions Precautions Precautions: Fall      Mobility  Bed Mobility Overal bed mobility: Needs Assistance Bed Mobility: Sit to Supine       Sit to supine: Mod assist   General bed mobility comments: Mod assist to help lift bil legs into bed from sitting.  Pt was generally able to control trunk with use of bed rail.   Transfers Overall transfer level: Needs assistance   Transfers: Sit to/from Stand Sit to Stand: Mod assist Stand pivot transfers:  (used the steady)       General transfer comment: Heavy mod assist to stand from low recliner chair.  Pt reporting weak legs.  Verbal cues for safe hand placement.  Steady used for transfer due to increased safety and no +2 help at this time.   Ambulation/Gait             General  Gait Details: Pt is unable at this time.       Modified Rankin (Stroke Patients Only) Modified Rankin (Stroke Patients Only) Pre-Morbid Rankin Score: No symptoms Modified Rankin: Severe disability     Balance Overall balance assessment: Needs assistance Sitting-balance support: Bilateral upper extremity supported;Feet unsupported Sitting balance-Leahy Scale: Poor Sitting balance - Comments: Needs min assist to prevent posterior LOB while seated EOB with feet off of the ground.  Postural control: Posterior lean Standing balance support: Bilateral upper extremity supported Standing balance-Leahy Scale: Poor Standing balance comment: mod assist to maintain standing.                              Pertinent Vitals/Pain Pain Assessment: Faces Faces Pain Scale: No hurt    Home Living Family/patient expects to be discharged to:: Private residence Living Arrangements: Spouse/significant other Available Help at Discharge: Family;Available 24 hours/day (her spouse) Type of Home: House Home Access: Level entry     Home Layout: One level Home Equipment: Walker - 2 wheels;Cane - single point;Bedside commode Additional Comments: Unsure of husbnad's ability to care for pt. Pt states she has not eaten becuase she was unable to get to food.  Pt report that both she and her husband drive.     Prior Function Level  of Independence: Independent         Comments: Pt reports whe was not using a RW and was completing her own self care     Hand Dominance   Dominant Hand: Right    Extremity/Trunk Assessment   Upper Extremity Assessment Upper Extremity Assessment: Defer to OT evaluation    Lower Extremity Assessment Lower Extremity Assessment: Generalized weakness;RLE deficits/detail;LLE deficits/detail RLE Deficits / Details: functionally, pt was able to stand weakly and with signnificant support over legs.  Ankles and knees at least 3/5, pt unable to flex hips against  gravity to lift them into bed.  Legs seem equally weak bil.   LLE Deficits / Details: functionally, pt was able to stand weakly and with signnificant support over legs.  Ankles and knees at least 3/5, pt unable to flex hips against gravity to lift them into bed.  Legs seem equally weak bil.      Cervical / Trunk Assessment Cervical / Trunk Assessment: Kyphotic;Other exceptions Cervical / Trunk Exceptions: Pt was leaning heavily to the right over the recliner chair armrests when I entered the room.  She has h/o back surgery.   Communication   Communication: HOH  Cognition Arousal/Alertness: Awake/alert Behavior During Therapy: Anxious;Restless Overall Cognitive Status: No family/caregiver present to determine baseline cognitive functioning                                 General Comments: Pt very gittery and restless during my session.  She was eating her food with her hands because she could not grip a fork because of trembling.  No family to confirm or deny history given.              Assessment/Plan    PT Assessment Patient needs continued PT services  PT Problem List Decreased strength;Decreased activity tolerance;Decreased balance;Decreased mobility;Decreased coordination;Decreased cognition;Decreased knowledge of use of DME;Decreased safety awareness;Decreased knowledge of precautions       PT Treatment Interventions DME instruction;Gait training;Functional mobility training;Therapeutic activities;Stair training;Therapeutic exercise;Balance training;Neuromuscular re-education;Cognitive remediation;Patient/family education    PT Goals (Current goals can be found in the Care Plan section)  Acute Rehab PT Goals Patient Stated Goal: to get stronger, back to her normal self.  PT Goal Formulation: With patient Time For Goal Achievement: 07/11/17 Potential to Achieve Goals: Fair    Frequency Min 3X/week   Barriers to discharge Other (comment) I am not sure that  her husband can provide physical assistance.        AM-PAC PT "6 Clicks" Daily Activity  Outcome Measure Difficulty turning over in bed (including adjusting bedclothes, sheets and blankets)?: Total Difficulty moving from lying on back to sitting on the side of the bed? : Total Difficulty sitting down on and standing up from a chair with arms (e.g., wheelchair, bedside commode, etc,.)?: Total Help needed moving to and from a bed to chair (including a wheelchair)?: A Lot Help needed walking in hospital room?: Total Help needed climbing 3-5 steps with a railing? : Total 6 Click Score: 7    End of Session   Activity Tolerance: Patient limited by fatigue Patient left: in bed;with call bell/phone within reach;with bed alarm set   PT Visit Diagnosis: Unsteadiness on feet (R26.81);Muscle weakness (generalized) (M62.81);Difficulty in walking, not elsewhere classified (R26.2);Other symptoms and signs involving the nervous system (U31.497)    Time: 0263-7858 PT Time Calculation (min) (ACUTE ONLY): 19 min     Charges:  Barbarann Ehlers Annye Forrey, PT, DPT [phone]     PT Evaluation $PT Eval Moderate Complexity: 1 Procedure     06/27/2017, 10:50 PM

## 2017-06-28 LAB — CBC
HEMATOCRIT: 39.1 % (ref 36.0–46.0)
Hemoglobin: 12.4 g/dL (ref 12.0–15.0)
MCH: 27.7 pg (ref 26.0–34.0)
MCHC: 31.7 g/dL (ref 30.0–36.0)
MCV: 87.3 fL (ref 78.0–100.0)
Platelets: 196 10*3/uL (ref 150–400)
RBC: 4.48 MIL/uL (ref 3.87–5.11)
RDW: 14.9 % (ref 11.5–15.5)
WBC: 12 10*3/uL — AB (ref 4.0–10.5)

## 2017-06-28 LAB — BASIC METABOLIC PANEL
Anion gap: 7 (ref 5–15)
Anion gap: 9 (ref 5–15)
BUN: 8 mg/dL (ref 6–20)
BUN: 12 mg/dL (ref 6–20)
CALCIUM: 8.1 mg/dL — AB (ref 8.9–10.3)
CHLORIDE: 107 mmol/L (ref 101–111)
CO2: 22 mmol/L (ref 22–32)
CO2: 25 mmol/L (ref 22–32)
CREATININE: 0.78 mg/dL (ref 0.44–1.00)
Calcium: 8.6 mg/dL — ABNORMAL LOW (ref 8.9–10.3)
Chloride: 110 mmol/L (ref 101–111)
Creatinine, Ser: 0.94 mg/dL (ref 0.44–1.00)
GFR calc non Af Amer: >60 mL/min (ref >60)
GFR calc non Af Amer: 57 mL/min — ABNORMAL LOW (ref >60)
Glucose, Bld: 108 mg/dL — ABNORMAL HIGH (ref 65–99)
Glucose, Bld: 150 mg/dL — ABNORMAL HIGH (ref 65–99)
POTASSIUM: 3.5 mmol/L (ref 3.5–5.1)
Potassium: 3.4 mmol/L — ABNORMAL LOW (ref 3.5–5.1)
SODIUM: 141 mmol/L (ref 135–145)
Sodium: 139 mmol/L (ref 135–145)

## 2017-06-28 LAB — BLOOD GAS, ARTERIAL
ACID-BASE DEFICIT: 11.6 mmol/L — AB (ref 0.0–2.0)
Bicarbonate: 14.8 mmol/L — ABNORMAL LOW (ref 20.0–28.0)
Drawn by: 39899
FIO2: 100
O2 Saturation: 99.1 %
PCO2 ART: 38.8 mmHg (ref 32.0–48.0)
PH ART: 7.206 — AB (ref 7.350–7.450)
PO2 ART: 376 mmHg — AB (ref 83.0–108.0)
Patient temperature: 98.6

## 2017-06-28 LAB — PREALBUMIN: PREALBUMIN: 20 mg/dL (ref 18–38)

## 2017-06-28 LAB — LACTIC ACID, PLASMA: Lactic Acid, Venous: 1.8 mmol/L (ref 0.5–1.9)

## 2017-06-28 LAB — HEMOGLOBIN A1C
HEMOGLOBIN A1C: 5.4 % (ref 4.8–5.6)
MEAN PLASMA GLUCOSE: 108 mg/dL

## 2017-06-28 LAB — CK: Total CK: 725 U/L — ABNORMAL HIGH (ref 38–234)

## 2017-06-28 MED ORDER — ALPRAZOLAM 0.5 MG PO TABS
1.0000 mg | ORAL_TABLET | Freq: Three times a day (TID) | ORAL | Status: DC | PRN
  Administered 2017-06-28 – 2017-06-30 (×4): 1 mg via ORAL
  Filled 2017-06-28 (×4): qty 2

## 2017-06-28 MED ORDER — ZOLPIDEM TARTRATE 5 MG PO TABS
5.0000 mg | ORAL_TABLET | Freq: Every evening | ORAL | Status: DC | PRN
  Administered 2017-06-28: 5 mg via ORAL
  Filled 2017-06-28: qty 1

## 2017-06-28 MED ORDER — SODIUM CHLORIDE 0.9 % IV SOLN
INTRAVENOUS | Status: AC
  Administered 2017-06-28: 18:00:00 via INTRAVENOUS

## 2017-06-28 MED ORDER — ALPRAZOLAM 0.5 MG PO TABS
0.5000 mg | ORAL_TABLET | Freq: Three times a day (TID) | ORAL | Status: DC | PRN
  Administered 2017-06-28: 0.5 mg via ORAL
  Filled 2017-06-28: qty 1

## 2017-06-28 MED ORDER — LORAZEPAM 2 MG/ML IJ SOLN
0.5000 mg | Freq: Once | INTRAMUSCULAR | Status: AC
  Administered 2017-06-28: 0.5 mg via INTRAVENOUS

## 2017-06-28 MED ORDER — LORAZEPAM 2 MG/ML IJ SOLN
INTRAMUSCULAR | Status: AC
  Administered 2017-06-28: 0.5 mg
  Filled 2017-06-28: qty 1

## 2017-06-28 NOTE — Progress Notes (Signed)
Physical Therapy Treatment Patient Details Name: [name] [name] MRN: [id number] DOB: 1936/02/12 Today's Date: 06/28/2017    History of Present Illness 81 y.o. female admitted on 06/26/17 aftering being found down on the floor by her husband and unable to get up.  Pt admitted to r/o seizure vs medication interaction vs infection.  Subsequently found to be hypertensive.  Head CT negative, UDS positive for benzodiazepines with leukocytosis.  EEG results pending.  Pt with significant PMH of stroke, PVD, HTN, PAF, chronic diastolic CHF, ARF, anxiety, L TKA, back surgery and ORIF clavicular fx.      PT Comments    Pt on arrival incontinent of urine and required multiple reps of rolling to clean patient in prep for transfer OOB.  Upon standing patient able to take 1 step forward before shaking uncontrollably and requiring chair to be brought behind patient for transfer to seated position.  Upon sitting patient presents in complete extensor tone and became verbally unresponsive.  Pt required increased assist to scoot back further into chair and staff emergnecy button pressed.  Upon nurse arrival assisted patient back to bed for nurse assessment, Pt required total +2-3 for back to bed transfer.  Pt remains unresponsive in bed, Obtained O2 saturations due to decreased color in patient's face and )2 sats had decreased to 68%, RN applied O2 and obtaining further vitals.  Physical Therapist Assistant reported findings during session to patient's nurse and rapid response team.  Pt left supine in bed with nursing team and rapid response team at bedside.    Follow Up Recommendations  SNF (Pt is not safe to return home.  )     Equipment Recommendations  None recommended by PT    Recommendations for Other Services       Precautions / Restrictions Precautions Precautions: Fall Precaution Comments: seizure precautions    Mobility  Bed Mobility Overal bed mobility: Needs Assistance Bed Mobility: Supine to  Sit;Sit to Supine;Rolling Rolling: Mod assist (Pt required assist to roll right and Left to perform perianal care.  Pt saturated in urine and require multiple reps of rolling.  )   Supine to sit: +2 for physical assistance;Mod assist Sit to supine: Total assist;+2 for physical assistance (Pt unresponsive and placed back into bed for nursing assessment.  Rapid response called on patient.  )   General bed mobility comments: Pt required mod assist to advance LEs to edge of bed, and mod +2 to elevate trunk into sitting.  Pt remains jittery and shakey upon sitting, but able to communicate while sitting edge of bed and agreeable to stand.    After standing patient transferred to chair and presented in extensor tone, eyes rolling and glazed over, Pt required transfer back to bed and total assistance to return to supine and scoot to head of bed.  Pt unreponsive during transfer back to bed.    Transfers Overall transfer level: Needs assistance Equipment used: Rolling walker (2 wheeled) Transfers: Sit to/from Stand Sit to Stand: Mod assist;+2 physical assistance;Total assist (Mod assistance +2 for sit to stand with cues for hand placement to and from seated surface.  For transfer back to bed patient unresponsive and required total assist +2.  )         General transfer comment: Pt required cues for hand placement to and from seated surface.  Pt reaching for RW and required cues to push from seated surface.  Pt reports feeling tired in sitting and requesting to sit.  PTA moved chair  behind patient in prep to sit.  Pt turned from bed to chair and began shaking uncontrollably and presented in full extensor tone.  Assisted patient back in chair further and pushed staff emergency call button.  Once nursing arrived patient required transfer back to bed requiring total +2 assist with no trunk or head control.      Ambulation/Gait Ambulation/Gait assistance:  (Pt performed 1-2 steps before presenting with seizure  like activity see transfer details.  Max +2 for 1-2 steps. )               Stairs            Wheelchair Mobility    Modified Rankin (Stroke Patients Only)       Balance Overall balance assessment: Needs assistance Sitting-balance support: Bilateral upper extremity supported;Feet unsupported Sitting balance-Leahy Scale: Zero (Poor when patient alert zero aftr becoming unreponsive during tx.  )       Standing balance-Leahy Scale: Zero Standing balance comment: Pt requiring total assist after seizure like activity during tx.                              Cognition Arousal/Alertness: Awake/alert Behavior During Therapy: Anxious;Restless Overall Cognitive Status: Difficult to assess (Pt and wife both appear to be poor historians.  )                                 General Comments: Pt alert during session speaking to therapist and students in room during session.  Pt remains jittery during session.  After standing patient presented with seizure like symptoms.  Increased extensor tone noted and patient unreponsive.  Staff emegency pushed and nursing into assess and assist with patient symptoms.        Exercises      General Comments        Pertinent Vitals/Pain Pain Assessment: Faces Faces Pain Scale: No hurt Pain Location: "all over"    Home Living                      Prior Function            PT Goals (current goals can now be found in the care plan section) Acute Rehab PT Goals Patient Stated Goal: none stated. Potential to Achieve Goals: Fair Progress towards PT goals: Progressing toward goals    Frequency    Min 3X/week      PT Plan Current plan remains appropriate    Co-evaluation              AM-PAC PT "6 Clicks" Daily Activity  Outcome Measure  Difficulty turning over in bed (including adjusting bedclothes, sheets and blankets)?: Total Difficulty moving from lying on back to sitting on the  side of the bed? : Total Difficulty sitting down on and standing up from a chair with arms (e.g., wheelchair, bedside commode, etc,.)?: Total Help needed moving to and from a bed to chair (including a wheelchair)?: Total Help needed walking in hospital room?: Total Help needed climbing 3-5 steps with a railing? : Total 6 Click Score: 6    End of Session Equipment Utilized During Treatment: Gait belt Activity Tolerance: Treatment limited secondary to medical complications (Comment) (Rapid response called during session.  ) Patient left: in bed;with call bell/phone within reach;with bed alarm set   PT Visit Diagnosis: Unsteadiness on  feet (R26.81);Muscle weakness (generalized) (M62.81);Difficulty in walking, not elsewhere classified (R26.2);Other symptoms and signs involving the nervous system (R29.898)     Time: 2841-3244 PT Time Calculation (min) (ACUTE ONLY): 45 min  Charges:  $Therapeutic Activity: 38-52 mins                    G Codes:       Governor Rooks, PTA pager [phone]    Cristela Blue 06/28/2017, 5:45 PM

## 2017-06-28 NOTE — Progress Notes (Signed)
[facility]                                                                              Patient Demographics  [name] [name], is a 81 y.o. female, DOB - 1936-07-13, DGL:[id number]  Admit date - 06/26/2017   Admitting Physician Elwin Mocha, MD  Outpatient Primary MD for the patient is [name], MD  Outpatient specialists:   LOS - 2  days    Chief Complaint  Patient presents with  . 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 Head CT negative, UDS positive for benzodiazepines with leukocytosis of more than 15,000.  Patient admitted to the hospital after initiation of Rocephin in view of recent Escherichia coli and Klebsiella urinary infection with leukocytosis.   Assessment & Plan    Active Problems:   Arthritis   Hypertension   GERD (gastroesophageal reflux disease)   Anxiety   IBS (irritable bowel syndrome)   Leukocytosis   Chronic diastolic CHF (congestive heart failure) (HCC)   Hypothyroidism   Altered mental state   Seizure ([place])   Paroxysmal atrial fibrillation ([place])   Fall  #1. Acute Confusion: At baseline No evidence of obvious precipitating factor-? Urinary infection/toxic metabolic etiology CT head negative MRI brain negative EEG -mild diffuse slowing of the waking background -non-specific; toxic/metabolic encephalopathy vs medication effect Hold sedatives and anxiolytics or now.  Doubt infectious etiology-follow cultures of blood and urine and transition to oral antibiotic     #2. Dehydration:  resolved  #3. Fall: Likely related  to deconditioning with dehydration  #4. Rhabdomyolysis: Improving Due to fall/lying on floor overnight IV rehydration with monitoring of electrolytes and renal function  #5 Failure to Thrive in adult: Due to deconditioning Seen by PT/OT - not safe to DC home; will benefit from rehab at SNF to maximize functional level of independence.  #6 Left orbital inferolateral mass: Incidental finding per MRI of 06/26/2017 Out-pt opthamology f/u    #7 Hypertension: Systolic bp high - repeat for consistent numbers and consider further optimization as needed  #8 Chronic Diastolic CHF: No acute decompensation Watch for fluid overload-  daily weights, intake and output    #9 Depression with Anxiety  Continue home Effexor Maximize dose of SSRI as tolerated Consider tapering off benzo; avoidance of sedative-hypnotics- risk of dependence  #10 Hyperglycemia:  No prior history of diabetes mellitus. Possible stress related  Sliding-scale insulin as needed        Code Status: Full DVT Prophylaxis:  SCD's Family Communication:  Disposition Plan: Home  Time Spent in minutes  30 minutes  Procedures:    Consultants:    Antimicrobials: Rocephin, started 06/26/17   Medications  Scheduled Meds: . amLODipine  5 mg Oral Daily  . aspirin EC  81  mg Oral Daily  . atenolol  50 mg Oral Daily  . feeding supplement (ENSURE ENLIVE)  237 mL Oral BID BM  . furosemide  20 mg Oral Q breakfast  . levothyroxine  75 mcg Oral QAC breakfast  . pantoprazole  40 mg Oral Daily  . potassium chloride SA  40 mEq Oral Daily  . QUEtiapine  25 mg Oral QHS  . simvastatin  10 mg Oral QHS  . venlafaxine XR  150 mg Oral Q breakfast   Continuous Infusions: . sodium chloride 125 mL/hr at 06/28/17 0117  . cefTRIAXone (ROCEPHIN)  IV Stopped (06/27/17 1610)   PRN Meds:.acetaminophen **OR** acetaminophen, bisacodyl, hydrALAZINE, ondansetron **OR** ondansetron (ZOFRAN) IV, zolpidem   Antibiotics    Anti-infectives    Start     Dose/Rate Route Frequency Ordered Stop   06/27/17 1400  cefTRIAXone (ROCEPHIN) 1 g in dextrose 5 % 50 mL IVPB     1 g 100 mL/hr over 30 Minutes Intravenous Every 24 hours 06/26/17 1444     06/26/17 1330  cefTRIAXone (ROCEPHIN) 1 g in dextrose 5 % 50 mL IVPB     1 g 100 mL/hr over 30 Minutes Intravenous  Once 06/26/17 1317 06/26/17 1433        Subjective:   Korryn Pancoast was seen and examined today. No acute vents noted overnight;she denies fever or chills. No dysuria. No dizziness or headaches.   Objective:   Vitals:   06/27/17 2139 06/28/17 0020 06/28/17 0500 06/28/17 0551  BP: (!) 184/83 (!) 190/81  132/85  Pulse: 63 68  70  Resp: 18 18  18   Temp: 98.5 F (36.9 C) 97.7 F (36.5 C)  98.3 F (36.8 C)  TempSrc: Oral Oral  Oral  SpO2: 96% 95%  93%  Weight:   75.2 kg (165 lb 12.6 oz)   Height:        Intake/Output Summary (Last 24 hours) at 06/28/17 0820 Last data filed at 06/28/17 0600  Gross per 24 hour  Intake          2385.83 ml  Output                0 ml  Net          2385.83 ml     Wt Readings from Last 3 Encounters:  06/28/17 75.2 kg (165 lb 12.6 oz)  03/15/17 63 kg (138 lb 14.4 oz)  02/09/17 66.2 kg (146 lb)     Exam  General: NAD  HEENT: NCAT,  PERRL, moist MM  Neck: SUPPLE, (-) JVD  Cardiovascular: RRR, (-) GALLOP, (-) MURMUR  Respiratory: CTA  Gastrointestinal: SOFT, (-) DISTENSION, BS(+), (_) TENDERNESS  Ext: (-) CYANOSIS, (-) EDEMA  Neuro: Alert and Responsive, appropriate, follows commands. Oriented 3   Skin:(-) RASH  Psych:NORMAL AFFECT/MOOD   Data Reviewed:  I have personally reviewed following labs and imaging studies  Micro Results Recent Results (from the past 240 hour(s))  Urine culture     Status: None   Collection Time: 06/26/17  1:20 PM  Result Value Ref Range Status   Specimen Description URINE, CATHETERIZED  Final   Special Requests NONE  Final   Culture NO GROWTH  Final   Report  Status 06/27/2017 FINAL  Final  Culture, blood (Routine X 2) w Reflex to ID Panel     Status: None (Preliminary result)   Collection Time: 06/26/17  4:46 PM  Result Value Ref Range Status   Specimen Description BLOOD LEFT HAND  Final   Special Requests IN PEDIATRIC BOTTLE Blood Culture adequate volume  Final   Culture NO GROWTH < 24 HOURS  Final   Report Status PENDING  Incomplete  Culture, blood (Routine X 2) w Reflex to ID Panel     Status: None (Preliminary result)   Collection Time: 06/26/17  5:41 PM  Result Value Ref Range Status   Specimen Description BLOOD RIGHT ANTECUBITAL  Final   Special Requests IN PEDIATRIC BOTTLE Blood Culture adequate volume  Final   Culture NO GROWTH < 24 HOURS  Final   Report Status PENDING  Incomplete    Radiology Reports Dg Chest 2 View  Result Date: 06/26/2017 CLINICAL DATA:  Fall, altered mental status EXAM: CHEST  2 VIEW COMPARISON:  03/11/2017 FINDINGS: Mild cardiomegaly without CHF, focal pneumonia, collapse, or consolidation. No effusion or pneumothorax. Trachea is midline. Postop changes of the thoracolumbar spine, left clavicle and left shoulder. Monitor leads overlie the chest. Remote cholecystectomy noted. IMPRESSION: Cardiomegaly without acute chest process Electronically Signed   By: Jerilynn Mages.  Shick M.D.   On: 06/26/2017 12:22   Ct Head Wo Contrast  Result Date: 06/26/2017 CLINICAL DATA:  Slurred speech. Possible right facial droop. Found on the floor this morning at 3 a.m. EXAM: CT HEAD WITHOUT CONTRAST CT CERVICAL SPINE WITHOUT CONTRAST TECHNIQUE: Multidetector CT imaging of the head and cervical spine was performed following the standard protocol without intravenous contrast. Multiplanar CT image reconstructions of the cervical spine were also generated. COMPARISON:  Brain MR dated 11/11/2016. Head CT dated 11/10/2016 and cervical spine CT dated 09/06/2016. FINDINGS: CT HEAD FINDINGS Brain: Diffusely enlarged ventricles and subarachnoid spaces.  Patchy white matter low density in both cerebral hemispheres. No intracranial hemorrhage, mass lesion or CT evidence of acute infarction. Vascular: No hyperdense vessel or unexpected calcification. Skull: Normal. Negative for fracture or focal lesion. Sinuses/Orbits: Small amount of fluid in the left maxillary sinus. Unremarkable orbits. Other: None. CT CERVICAL SPINE FINDINGS Alignment: Normal. Skull base and vertebrae: No acute fracture. No primary bone lesion or focal pathologic process. Soft tissues and spinal canal: No prevertebral fluid or swelling. No visible canal hematoma. Disc levels: Stable disc space narrowing and mild anterior and minimal posterior spur formation at the C5-6 and C6-7 levels. Facet degenerative changes at multiple levels. Upper chest: Stable mild patchy interstitial prominence at both lung apices. Other: Post carotid endarterectomy changes on the left and mild right carotid artery calcifications. IMPRESSION: 1. No skull fracture or intracranial hemorrhage. 2. No cervical spine fracture or subluxation. 3. Stable mild diffuse cerebral and cerebellar atrophy and mild chronic small vessel white matter ischemic changes in both cerebral hemispheres. 4. Stable cervical spine degenerative changes. 5. Mild right carotid artery atheromatous calcifications. 6. Stable mild chronic interstitial lung disease. Electronically Signed   By: Claudie Revering M.D.   On: 06/26/2017 12:15   Ct Cervical Spine Wo Contrast  Result Date: 06/26/2017 CLINICAL DATA:  Slurred speech. Possible right facial droop. Found on the floor this morning at 3 a.m. EXAM: CT HEAD WITHOUT CONTRAST CT CERVICAL SPINE WITHOUT CONTRAST TECHNIQUE: Multidetector CT imaging of the head and cervical spine was performed following the standard protocol without intravenous contrast. Multiplanar CT image reconstructions of the cervical spine were also generated. COMPARISON:  Brain MR dated 11/11/2016. Head CT dated 11/10/2016 and cervical  spine CT dated 09/06/2016. FINDINGS: CT HEAD FINDINGS Brain: Diffusely enlarged ventricles and subarachnoid spaces. Patchy white matter low density in both cerebral hemispheres. No intracranial hemorrhage, mass lesion or  CT evidence of acute infarction. Vascular: No hyperdense vessel or unexpected calcification. Skull: Normal. Negative for fracture or focal lesion. Sinuses/Orbits: Small amount of fluid in the left maxillary sinus. Unremarkable orbits. Other: None. CT CERVICAL SPINE FINDINGS Alignment: Normal. Skull base and vertebrae: No acute fracture. No primary bone lesion or focal pathologic process. Soft tissues and spinal canal: No prevertebral fluid or swelling. No visible canal hematoma. Disc levels: Stable disc space narrowing and mild anterior and minimal posterior spur formation at the C5-6 and C6-7 levels. Facet degenerative changes at multiple levels. Upper chest: Stable mild patchy interstitial prominence at both lung apices. Other: Post carotid endarterectomy changes on the left and mild right carotid artery calcifications. IMPRESSION: 1. No skull fracture or intracranial hemorrhage. 2. No cervical spine fracture or subluxation. 3. Stable mild diffuse cerebral and cerebellar atrophy and mild chronic small vessel white matter ischemic changes in both cerebral hemispheres. 4. Stable cervical spine degenerative changes. 5. Mild right carotid artery atheromatous calcifications. 6. Stable mild chronic interstitial lung disease. Electronically Signed   By: Claudie Revering M.D.   On: 06/26/2017 12:15   Mr Brain Wo Contrast  Result Date: 06/26/2017 CLINICAL DATA:  Unwitnessed fall. Patient found down. Query seizure, infection, versus overdose. EXAM: MRI HEAD WITHOUT CONTRAST TECHNIQUE: Multiplanar, multiecho pulse sequences of the brain and surrounding structures were obtained without intravenous contrast. COMPARISON:  MR brain 11/11/2016.  CT head 06/26/2017. FINDINGS: Brain: No evidence for acute  infarction, hemorrhage, mass lesion, hydrocephalus, or extra-axial fluid. Generalized atrophy. Chronic microvascular ischemic change. Vascular: Normal flow voids. Skull and upper cervical spine: Normal marrow signal. Sinuses/Orbits: Negative sinuses. In the inferolateral orbit on the LEFT there is an 11 x 10 x 11 mm rounded mass, which may AP extraconal or may be intimately associated with an extra ocular muscle (inferior oblique close). On CT the lesion is hyperdense. On MR there is no significant restriction. No associated sinus or osseous pathology. Lacrimal glands appear normal. Compared with the prior CT and MR studies, the lesion has grown since 2017. Other: None. IMPRESSION: Atrophy and small vessel disease similar to priors. No acute intracranial findings. LEFT orbital inferolateral mass. This is either extraconal or associated with the muscle. This represents an unusual lesion, and the differential is wide. Considerations would include unusual thyroid orbitopathy, lymphoma, orbital pseudotumor, metastasis, sarcoidosis, neurofibroma, or meningioma. Recommend ophthalmologic evaluation. It is possible the lesion could be better characterized with sonography. Electronically Signed   By: Staci Righter M.D.   On: 06/26/2017 15:51    Lab Data:  CBC:  Recent Labs Lab 06/26/17 1143 06/26/17 1204 06/27/17 0325  WBC 15.1*  --  9.6  NEUTROABS 13.7*  --   --   HGB 12.9 13.3 11.3*  HCT 39.5 39.0 35.7*  MCV 85.9  --  86.9  PLT 226  --  938   Basic Metabolic Panel:  Recent Labs Lab 06/26/17 1143 06/26/17 1204 06/26/17 1701 06/27/17 0325 06/28/17 0620  NA 142 144  --  141 139  K 3.9 3.8  --  3.5 3.4*  CL 111 110  --  112* 110  CO2 19*  --   --  20* 22  GLUCOSE 120* 121*  --  106* 108*  BUN 14 16  --  10 8  CREATININE 1.13* 1.00  --  0.93 0.78  CALCIUM 9.2  --   --  8.4* 8.1*  MG  --   --  1.8  --   --   PHOS  --   --  4.0  --   --    GFR: Estimated Creatinine Clearance: 53.8 mL/min  (by C-G formula based on SCr of 0.78 mg/dL). Liver Function Tests:  Recent Labs Lab 06/26/17 1143 06/27/17 0325  AST 68* 60*  ALT 36 36  ALKPHOS 96 78  BILITOT 1.2 0.8  PROT 6.5 5.7*  ALBUMIN 3.4* 3.0*   No results for input(s): LIPASE, AMYLASE in the last 168 hours. No results for input(s): AMMONIA in the last 168 hours. Coagulation Profile:  Recent Labs Lab 06/27/17 0325  INR 1.01   Cardiac Enzymes:  Recent Labs Lab 06/26/17 1143 06/27/17 0325 06/28/17 0620  CKTOTAL 2,013* 1,877* 725*   BNP (last 3 results) No results for input(s): PROBNP in the last 8760 hours. HbA1C:  Recent Labs  06/26/17 1701  HGBA1C 5.4   CBG:  Recent Labs Lab 06/27/17 0612  GLUCAP 214*   Lipid Profile: No results for input(s): CHOL, HDL, LDLCALC, TRIG, CHOLHDL, LDLDIRECT in the last 72 hours. Thyroid Function Tests: No results for input(s): TSH, T4TOTAL, FREET4, T3FREE, THYROIDAB in the last 72 hours. Anemia Panel: No results for input(s): VITAMINB12, FOLATE, FERRITIN, TIBC, IRON, RETICCTPCT in the last 72 hours. Urine analysis:    Component Value Date/Time   COLORURINE YELLOW 06/26/2017 [place] 06/26/2017 1322   LABSPEC 1.014 06/26/2017 1322   PHURINE 5.0 06/26/2017 1322   GLUCOSEU NEGATIVE 06/26/2017 1322   HGBUR MODERATE (A) 06/26/2017 1322   BILIRUBINUR NEGATIVE 06/26/2017 1322   KETONESUR 5 (A) 06/26/2017 1322   PROTEINUR 100 (A) 06/26/2017 1322   UROBILINOGEN 0.2 09/04/2014 1340   NITRITE NEGATIVE 06/26/2017 1322   LEUKOCYTESUR NEGATIVE 06/26/2017 1322     OSEI-BONSU,Shyana Kulakowski M.D. [facility] 06/28/2017, 8:20 AM  Pager: [phone] Between 7am to 7pm - call Pager - [phone]  After 7pm go to [email or web address] - password [email or web address]  Call night coverage person covering after 7pm

## 2017-06-28 NOTE — Significant Event (Addendum)
Rapid Response Event Note  Overview:  Called to see patient with change in neuro status Time Called: 1658 Arrival Time: 1703 Event Type: Neurologic  Initial Focused Assessment:  On arrival patient supine in bed - hot and moist to touch - looks to be post-ictal - resps rapid - snoring resps  moves all 4's spont - pupils large 52mmERL - deviated upward - grimaces and withdraws to pain.  PT staff report patient was at baseline speaking with them- stood to chair - had episode of "stiffing extremites - extension and jerking of both arms then became unresponsive."  Incontinent of urine - no evidence of tongue biting noted.  BP 214/88 ST 112 RR 28 on NRB mask 100% - PT reports she did have blue color change to face with event.     Interventions:  Stat ABG done - Dr. Vista Lawman on the phone with [facility] staff - patient now having some right shoulder twitching - eyes deviated upward - 0.5 mg Ativan IV ordered - IV leaking - new #22 angio started left forearm times one attempt - Ativan given.  Patient becoming more responsive - purposeful to stimulation.  Dr. Renne Crigler at bedside.  Meds reviewed.  Patient remains extremely diaphoretic - NS started per order - repositioned in bed - IV out.  #22 angio inserted right wrist times one attempt - NS to line.  Patient alert - answering few questions - follows simple commands - denies headache or dizziness.  Some nausea.  Zofran IV given by Kasandra Knudsen RN per order Dr. Renne Crigler.  More alert - recognizes family - answers questions - moves all 4 to command.  Dr. Vista Lawman at bedside.  Handoff to [employer].  Bp 166/68 HR 101 RR 22 O2 sats 97% on 3 liters.    Plan of Care (if not transferred):  Event Summary: Name of Physician Notified: Dr. Vista Lawman at  (pta RRT arrival)    at    Outcome: Stayed in room and stabalized  Event End Time: [place]  Quin Hoop

## 2017-06-28 NOTE — Progress Notes (Signed)
Received bedside report from Quillian Quince, [place]. Patient here d/t fall.  Right AC with NS infusing at 150. Telemetry 16 verified. Bed alarm on. Safety maintained. Will Monitor.

## 2017-06-28 NOTE — Progress Notes (Signed)
Rapid response page called to patient room.  Upon arrival patient was noted to be unresponsive and slightly pale in color.  Placed patient on non-rebreather mask until could obtain sat and ABG.  ABG was obtained, patient did not respond to sticking, and sent resulted.  Results were given to MD once arrived.  Due to ABG results of PaO2 of 376, placed patient back on 4L nasal cannula.  Will continue to monitor.

## 2017-06-28 NOTE — Progress Notes (Signed)
PT EXPERIENCING TREMORS THIS AM, UNABLE TO HANDLE CUPS/MEDS. ALERT ORIENTED. STATED THAT SHE HAS BEEN TAKING XANAX 4X DAILY. RECEIVED ORDER FOR XANAX .5 TREMORS STOPPED FOLLOWING ADMIN. NO AGITATION, NO CONFUSION, HUSBAND AT BEDSIDE. NO EVIDENCE OF SEIZURE ACTIVITY.

## 2017-06-28 NOTE — Consult Note (Signed)
NEURO HOSPITALIST CONSULT NOTE   Requestig physician: Dr. Vista Lawman  Reason for Consult: Possible recurrence of seizure  History obtained from:  Patient and Chart     HPI:                                                                                                                                          [name] [name] is an 81 y.o. female who initially presented with possible re-occurrence of seizure. She has a prior history of seizure in 2017 felt most likely to be due to benzodiazepine withdrawal after an extensive Neurological and Psychiatric workup. Also of note, she has a history of anxiety with sedative habituation. She was brought to the ED on 7/15 by EMS after what was felt to be an unwitnessed fall. Was in [place] until about 3 AM, when she was found on the floor by her husband, face down. He was unable to pick her up on his own, so he covered her with a blanket, eventually calling EMS at about 10 AM after she continued to be unable to get up. She was mildly confused on arrival and did not recall the event. Her husband did not notice any seizure activity after finding her down. Her daughter noted that the patient had a mild right facial droop. There was no lateralized weakness or sensory change, as well as no CP or SOB. She had a UTI about 1 month ago. She has had multiple falls recently. EEG obtained on 7/16 revealed mild diffuse slowing but no epileptiform discharges.   Yesterday (7/17), she experienced tremors that abated with re-initiation of Xanax. Subsequently, she had an episode that based upon the description most likely was a seizure. Per rapid response nursing note at 4:59 PM on Tuesday: "On arrival patient supine in bed - hot and moist to touch - looks to be post-ictal - resps rapid - snoring resps  moves all 4's spont - pupils large 56mmERL - deviated upward - grimaces and withdraws to pain.  PT staff report patient was at baseline speaking with them- stood to  chair - had episode of "stiffing extremites - extension and jerking of both arms then became unresponsive."  Incontinent of urine - no evidence of tongue biting noted.  BP 214/88 ST 112 RR 28 on NRB mask 100% - PT reports she did have blue color change to face with event."    PMHx includes carotid artery atherosclerotic disease s/p CEA in January of this year, anxiety, diastolic CHF, paroxysmal atrial fibrillation, HLD, HTN, hypothyroidism and PVD.   Past Medical History:  Diagnosis Date  . Alopecia   . Anemia    hx of years ago   . Anxiety   . ARF (acute renal failure) ([place]) 08/23/2014  . Arthritis  psoriatic arthritis  . Back pain   . Blood transfusion    at age of 4  . Carotid stenosis 12/2016  . Chronic diastolic CHF (congestive heart failure) ([place])   . Depression   . Dysrhythmia 11/13/2016   PAFib for a short time- Echo done 11/14/16 PAF- to follow up with PCP- to see if she is a candiate for anticoags.  Not started at times time due to alcohol abuse.  [name] GERD (gastroesophageal reflux disease)   . H/O hiatal hernia   . History of acute cholangitis   . Hyperlipidemia   . Hypertension   . Hypothyroidism   . IBS (irritable bowel syndrome)   . Pancreatitis   . Peripheral vascular disease ([place])   . PONV (postoperative nausea and vomiting)    with Breast reduction- only time  . Rosacea   . Seizures ([place]) 11/13/2016   Thopught to be from withdrawl Benzodiazepine  . Stroke [facility])    patient denies-  . Thyroid disease     Past Surgical History:  Procedure Laterality Date  . ABDOMINAL HYSTERECTOMY     partial  . BACK SURGERY  2010   thoractic-screws and rods  . BILE DUCT STENT PLACEMENT  08/26/2014  . BREAST REDUCTION SURGERY    . CHOLECYSTECTOMY  1988   lap  . COLONOSCOPY    . COLONOSCOPY Left 09/08/2014   Procedure: COLONOSCOPY;  Surgeon: Arta Silence, MD;  Location: [facility] ENDOSCOPY;  Service: Endoscopy;  Laterality: Left;  . ENDARTERECTOMY Left 12/28/2016  .  ENDARTERECTOMY Left 12/28/2016   Procedure: ENDARTERECTOMY CAROTID LEFT WITH XENOSURE BIOLOGIC PATCH ANGIOPLASTY;  Surgeon: Angelia Mould, MD;  Location: [place];  Service: Vascular;  Laterality: Left;  . ERCP    . ERCP N/A 08/26/2014   Procedure: ENDOSCOPIC RETROGRADE CHOLANGIOPANCREATOGRAPHY (ERCP);  Surgeon: Jeryl Columbia, MD;  Location: [facility] ENDOSCOPY;  Service: Endoscopy;  Laterality: N/A;  . ERCP N/A 09/11/2014   Procedure: ENDOSCOPIC RETROGRADE CHOLANGIOPANCREATOGRAPHY (ERCP);  Surgeon: Arta Silence, MD;  Location: [facility] ENDOSCOPY;  Service: Endoscopy;  Laterality: N/A;  . EUS N/A 09/11/2014   Procedure: ESOPHAGEAL ENDOSCOPIC ULTRASOUND (EUS) RADIAL;  Surgeon: Arta Silence, MD;  Location: [facility] ENDOSCOPY;  Service: Endoscopy;  Laterality: N/A;  . EYE SURGERY Bilateral    Cataract  . JOINT REPLACEMENT  2005   left shoulder replacement   . ORIF CLAVICULAR FRACTURE Left 07/18/2014   Procedure: OPEN REDUCTION INTERNAL FIXATION (ORIF) LEFT CLAVICULAR FRACTURE;  Surgeon: Ninetta Lights, MD;  Location: [place];  Service: Orthopedics;  Laterality: Left;  . SPYGLASS CHOLANGIOSCOPY N/A 09/11/2014   Procedure: SPYGLASS CHOLANGIOSCOPY;  Surgeon: Arta Silence, MD;  Location: [facility] ENDOSCOPY;  Service: Endoscopy;  Laterality: N/A;  . TONSILLECTOMY  as child  . TOTAL KNEE ARTHROPLASTY Left 03/10/2016   Procedure: LEFT TOTAL KNEE ARTHROPLASTY;  Surgeon: Ninetta Lights, MD;  Location: [place];  Service: Orthopedics;  Laterality: Left;  [name] VENTRAL HERNIA REPAIR  06/08/2012   Procedure: LAPAROSCOPIC VENTRAL HERNIA;  Surgeon: Adin Hector, MD;  Location: [facility] ORS;  Service: General;  Laterality: Right;    Family History  Problem Relation Age of Onset  . Heart disease Mother   . Cancer Father        lung    Social History:  reports that she has never smoked. She has never used smokeless tobacco. She reports that she uses drugs, including Benzodiazepines. She reports that she does not drink  alcohol.  Allergies  Allergen Reactions  . Tetanus  Toxoids Swelling and Other (See Comments)    Arm (site) became swollen as a child  . Yellow Dyes (Non-Tartrazine) Itching and Other (See Comments)    Makes patient nervous     MEDICATIONS:                                                                                                                     Prior to Admission:  Prescriptions Prior to Admission  Medication Sig Dispense Refill Last Dose  . acetaminophen (TYLENOL 8 HOUR ARTHRITIS PAIN) 650 MG CR tablet Take 1,300 mg by mouth every 8 (eight) hours as needed for pain (or headaches).    PRN  . ALPRAZolam (XANAX) 1 MG tablet Take 1 mg by mouth 4 (four) times daily as needed for anxiety.    Past Week at Unknown time  . amLODipine (NORVASC) 5 MG tablet Take 1 tablet (5 mg total) by mouth daily. 30 tablet 0 Past Week at Unknown time  . aspirin EC 81 MG tablet Take 81 mg by mouth daily.   Past Week at Unknown time  . aspirin-acetaminophen-caffeine (EXCEDRIN MIGRAINE) 250-250-65 MG tablet Take 1-2 tablets by mouth every 6 (six) hours as needed for headache.   Past Week at Unknown time  . atenolol (TENORMIN) 50 MG tablet Take 1 tablet (50 mg total) by mouth daily. 30 tablet 0 Past Week at Unknown time  . esomeprazole (NEXIUM) 40 MG capsule Take 40 mg by mouth daily.   Past Week at Unknown time  . furosemide (LASIX) 20 MG tablet Take 20 mg by mouth daily with breakfast.    Past Week at Unknown time  . gabapentin (NEURONTIN) 400 MG capsule Take 400 mg by mouth 3 (three) times daily as needed (for nerve pain).    PRN  . hyoscyamine (LEVBID) 0.375 MG 12 hr tablet Take 0.375 mg by mouth 2 (two) times daily as needed for cramping.    PRN  . levothyroxine (SYNTHROID, LEVOTHROID) 75 MCG tablet Take 75 mcg by mouth daily before breakfast.    Past Week at Unknown time  . potassium chloride SA (K-DUR,KLOR-CON) 20 MEQ tablet Take 40 mEq by mouth daily.    Past Week at Unknown time  . QUEtiapine  (SEROQUEL) 25 MG tablet Take 1 tablet (25 mg total) by mouth at bedtime. 30 tablet 0 Past Week at Unknown time  . simvastatin (ZOCOR) 10 MG tablet Take 10 mg by mouth at bedtime.   Past Week at Unknown time  . venlafaxine XR (EFFEXOR-XR) 150 MG 24 hr capsule Take 150 mg by mouth daily.   Past Week at Unknown time  . zolpidem (AMBIEN) 10 MG tablet Take 10 mg by mouth at bedtime.   Past Week at Unknown time  . diltiazem (CARDIZEM CD) 180 MG 24 hr capsule Take 1 capsule (180 mg total) by mouth daily. (Patient not taking: Reported on 06/27/2017) 30 capsule 0 Not Taking at Unknown time  . folic acid (FOLVITE) 1 MG tablet Take 1 tablet (1  mg total) by mouth daily. (Patient not taking: Reported on 06/02/2017) 30 tablet 0 Not Taking at Unknown time  . thiamine 100 MG tablet Take 1 tablet (100 mg total) by mouth daily. (Patient not taking: Reported on 06/02/2017) 30 tablet 0 Not Taking at Unknown time   Scheduled: . amLODipine  5 mg Oral Daily  . aspirin EC  81 mg Oral Daily  . atenolol  50 mg Oral Daily  . feeding supplement (ENSURE ENLIVE)  237 mL Oral BID BM  . furosemide  20 mg Oral Q breakfast  . levothyroxine  75 mcg Oral QAC breakfast  . pantoprazole  40 mg Oral Daily  . potassium chloride SA  40 mEq Oral Daily  . QUEtiapine  25 mg Oral QHS  . simvastatin  10 mg Oral QHS  . venlafaxine XR  150 mg Oral Q breakfast   Continuous: . sodium chloride 125 mL/hr at 06/28/17 0117  . sodium chloride 150 mL/hr at 06/28/17 1747  . cefTRIAXone (ROCEPHIN)  IV Stopped (06/28/17 1540)   OEU:[id number] **OR** acetaminophen, ALPRAZolam, bisacodyl, hydrALAZINE, ondansetron **OR** ondansetron (ZOFRAN) IV, zolpidem   ROS:                                                                                                                                       Positive for anxiety. Other ROS as per HPI.  Blood pressure (!) 214/88, pulse 99, temperature 98.6 F (37 C), temperature source Axillary, resp. rate (!)  100, height 5\' 1"  (1.549 m), weight 75.2 kg (165 lb 12.6 oz), SpO2 95 %.  General Examination:                                                                                                      HEENT-  Multiple facial bruises noted   Lungs- Respirations unlabored Extremities- Bruises to upper extremities noted. Abrasions to knees bilaterally  Neurological Examination Mental Status: Awake and alert. Oriented to city, state, day of week and month, but not year. Anxious affect. Concentration is fair. Speech is fluent but with paucity of ideas. Naming intact. Able to follow all simple commands without difficulty. Has difficulty with comprehension of complex questions and commands.  Cranial Nerves: II: Visual fields normal. Pupils 5 mm bilaterally in ambient light, constricting to 3 mm with penlight.   III,IV, VI: ptosis not present. Saccadic visual pursuits and mild oculomotor apraxia are noted. In this context EOM are full. There is fast beating nystagmus to the right with  rightward gaze. VII: No facial droop noted. Occasional tremulousness of facial musculature is noted.  VIII: hearing intact to voice IX,X: no hypophonia XI: bilateral shoulder shrug is symmetric XII: midline tongue extension with lingual tremor noted.  Motor:  Right : Upper extremity   4+/5    Left:     Upper extremity   4+/5  Lower extremity   4+/5    Lower extremity   4+/5 Decreased muscle bulk in all 4 extremities, worse than expected for age. No spasticity or paratonia noted. Diffusely tremulous with waxing/waning quality.  Sensory: Temp and light touch intact in all 4 limbs proximally. No extinction noted.  Deep Tendon Reflexes: 1+ biceps and brachioradialis bilaterally. 3+ patellae. 1+ achilles. Toes equivocal.   Cerebellar: Mild action tremor noted to bilateral upper extremities. Optic ataxia is noted with FNF bilaterally, but no appendicular ataxia is present.  Gait: Deferred   Lab Results: Basic Metabolic  Panel:  Recent Labs Lab 06/26/17 1143 06/26/17 1204 06/26/17 1701 06/27/17 0325 06/28/17 0620 06/28/17 1845  NA 142 144  --  141 139 141  K 3.9 3.8  --  3.5 3.4* 3.5  CL 111 110  --  112* 110 107  CO2 19*  --   --  20* 22 25  GLUCOSE 120* 121*  --  106* 108* 150*  BUN 14 16  --  10 8 12   CREATININE 1.13* 1.00  --  0.93 0.78 0.94  CALCIUM 9.2  --   --  8.4* 8.1* 8.6*  MG  --   --  1.8  --   --   --   PHOS  --   --  4.0  --   --   --     Liver Function Tests:  Recent Labs Lab 06/26/17 1143 06/27/17 0325  AST 68* 60*  ALT 36 36  ALKPHOS 96 78  BILITOT 1.2 0.8  PROT 6.5 5.7*  ALBUMIN 3.4* 3.0*   No results for input(s): LIPASE, AMYLASE in the last 168 hours. No results for input(s): AMMONIA in the last 168 hours.  CBC:  Recent Labs Lab 06/26/17 1143 06/26/17 1204 06/27/17 0325 06/28/17 1845  WBC 15.1*  --  9.6 12.0*  NEUTROABS 13.7*  --   --   --   HGB 12.9 13.3 11.3* 12.4  HCT 39.5 39.0 35.7* 39.1  MCV 85.9  --  86.9 87.3  PLT 226  --  189 196    Cardiac Enzymes:  Recent Labs Lab 06/26/17 1143 06/27/17 0325 06/28/17 0620  CKTOTAL 2,013* 1,877* 725*    Lipid Panel: No results for input(s): CHOL, TRIG, HDL, CHOLHDL, VLDL, LDLCALC in the last 168 hours.  CBG:  Recent Labs Lab 06/27/17 0612  GLUCAP 57*    Microbiology: Results for orders placed or performed during the hospital encounter of 06/26/17  Urine culture     Status: None   Collection Time: 06/26/17  1:20 PM  Result Value Ref Range Status   Specimen Description URINE, CATHETERIZED  Final   Special Requests NONE  Final   Culture NO GROWTH  Final   Report Status 06/27/2017 FINAL  Final  Culture, blood (Routine X 2) w Reflex to ID Panel     Status: None (Preliminary result)   Collection Time: 06/26/17  4:46 PM  Result Value Ref Range Status   Specimen Description BLOOD LEFT HAND  Final   Special Requests IN PEDIATRIC BOTTLE Blood Culture adequate volume  Final   Culture NO GROWTH  2 DAYS  Final   Report Status PENDING  Incomplete  Culture, blood (Routine X 2) w Reflex to ID Panel     Status: None (Preliminary result)   Collection Time: 06/26/17  5:41 PM  Result Value Ref Range Status   Specimen Description BLOOD RIGHT ANTECUBITAL  Final   Special Requests IN PEDIATRIC BOTTLE Blood Culture adequate volume  Final   Culture NO GROWTH 2 DAYS  Final   Report Status PENDING  Incomplete    Coagulation Studies:  Recent Labs  06/27/17 0325  LABPROT 13.3  INR 1.01    Imaging: No results found.  Assessment: 81 year old female with re-occurrence of seizure 1. Found down by husband at home, possibly secondary to a fall versus postictal state from seizure.  2. Known benzodiazepine dependence with recent missed doses, suggesting benzodiazepine withdrawal as the etiology for the seizure this admission. Has a prior history of suspected benzodiazepine withdrawal seizure.  3. Recent multiple falls. DDx includes gait instability due to deconditioning or vestibulopathy, seizures and syncope.   Recommendations: 1. Continue scheduled benzodiazepine.  2. CIWA protocol.  3. EEG 4. Thiamine level 5. Continue thiamine supplementation 6. TSH level 7. Work up for etiology of recent multiple falls.   Electronically signed: Dr. Kerney Elbe 06/28/2017, 8:12 PM

## 2017-06-28 NOTE — NC FL2 (Signed)
[place] LEVEL OF CARE SCREENING TOOL     IDENTIFICATION  Patient Name: [name] [name] Birthdate: 12/24/1935 Sex: female Admission Date (Current Location): 06/26/2017  [facility] and [place] Number:  Herbalist and Address:  [facility], [place] [address] [place], [place] [address]      Provider Number: [id number]  Attending Physician Name and Address:  Benito Mccreedy, MD  Relative Name and Phone Number:       Current Level of Care: Hospital Recommended Level of Care: [place] Prior Approval Number:    Date Approved/Denied:   PASRR Number: Pending  Discharge Plan: SNF    Current Diagnoses: Patient Active Problem List   Diagnosis Date Noted  . Fall 06/26/2017  . Intractable nausea and vomiting 03/11/2017  . Acute lower UTI 03/11/2017  . Carotid stenosis 12/28/2016  . Sedative, hypnotic, or anxiolytic withdrawal delirium ([place]) 11/15/2016  . Paroxysmal atrial fibrillation ([facility])   . Cerebrovascular disease   . Alcohol withdrawal delirium ([place])   . Fever   . Acute encephalopathy 11/10/2016  . Hypothyroidism 11/10/2016  . Hypertensive urgency 11/10/2016  . Altered mental state 11/10/2016  . Seizure ([place]) 11/10/2016  . Chronic diastolic CHF (congestive heart failure) ([place])   . Left carotid stenosis 10/29/2016  . S/P total knee replacement 03/10/2016  . 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 Ileus ([place]) 09/05/2014  . Leukocytosis 09/05/2014  . Vomiting 09/04/2014  . Pulmonary edema 09/04/2014  . Hypokalemia 08/28/2014  . Enteritis due to Clostridium difficile 08/27/2014  . 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 Protein-calorie malnutrition, severe ([place]) 08/24/2014  . Obstructive jaundice 08/23/2014  . Acute cholangitis 08/23/2014  . ARF (acute renal failure) ([place]) 08/23/2014  . Coagulopathy ([place]) 08/23/2014  . Back pain   . Arthritis   . Hypertension   . GERD (gastroesophageal reflux  disease)   . Anxiety   . IBS (irritable bowel syndrome)   . Hernia of Right posterior flank 04/18/2012    Orientation RESPIRATION BLADDER Height & Weight     Self  Normal External catheter Weight: 165 lb 12.6 oz (75.2 kg) Height:  5\' 1"  (154.9 cm)  BEHAVIORAL SYMPTOMS/MOOD NEUROLOGICAL BOWEL NUTRITION STATUS    Convulsions/Seizures Continent Diet (carb modified)  AMBULATORY STATUS COMMUNICATION OF NEEDS Skin   Extensive Assist Verbally Bruising                       Personal Care Assistance Level of Assistance  Bathing, Feeding, Dressing Bathing Assistance: Maximum assistance Feeding assistance: Limited assistance Dressing Assistance: Maximum assistance     Functional Limitations Info             SPECIAL CARE FACTORS FREQUENCY  PT (By licensed PT), OT (By licensed OT)     PT Frequency: 5x/wk OT Frequency: 5x/wk            Contractures      Additional Factors Info  Code Status, Allergies, Psychotropic Code Status Info: full Allergies Info: Tetanus Toxoids, Yellow Dyes (Non-tartrazine) Psychotropic Info: Seroquel 25mg , Effexor 150mg          Current Medications (06/28/2017):  This is the current hospital active medication list Current Facility-Administered Medications  Medication Dose Route Frequency Provider Last Rate Last Dose  . 0.9 %  sodium chloride infusion   Intravenous Continuous Benito Mccreedy, MD 125 mL/hr at 06/28/17 0117    . 0.9 %  sodium chloride infusion   Intravenous Continuous  Caren Griffins, MD 150 mL/hr at 06/28/17 1747    . acetaminophen (TYLENOL) tablet 650 mg  650 mg Oral Q6H PRN Rondel Jumbo, PA-C   650 mg at 06/27/17 7893   Or  . acetaminophen (TYLENOL) suppository 650 mg  650 mg Rectal Q6H PRN Rondel Jumbo, PA-C      . ALPRAZolam Duanne Moron) tablet 0.5 mg  0.5 mg Oral TID PRN Benito Mccreedy, MD   0.5 mg at 06/28/17 1330  . amLODipine (NORVASC) tablet 5 mg  5 mg Oral Daily Rondel Jumbo, PA-C   5 mg at 06/28/17 1036   . aspirin EC tablet 81 mg  81 mg Oral Daily Rondel Jumbo, PA-C   81 mg at 06/28/17 0816  . atenolol (TENORMIN) tablet 50 mg  50 mg Oral Daily Rondel Jumbo, PA-C   50 mg at 06/28/17 0815  . bisacodyl (DULCOLAX) suppository 10 mg  10 mg Rectal Daily PRN Sharene Butters E, PA-C      . cefTRIAXone (ROCEPHIN) 1 g in dextrose 5 % 50 mL IVPB  1 g Intravenous Q24H Tyrone Apple, [facility]   Stopped at 06/28/17 1540  . feeding supplement (ENSURE ENLIVE) (ENSURE ENLIVE) liquid 237 mL  237 mL Oral BID BM Osei-Bonsu, George, MD   237 mL at 06/28/17 1454  . furosemide (LASIX) tablet 20 mg  20 mg Oral Q breakfast Rondel Jumbo, PA-C   20 mg at 06/28/17 8101  . hydrALAZINE (APRESOLINE) injection 5-10 mg  5-10 mg Intravenous Q8H PRN Rondel Jumbo, PA-C      . levothyroxine (SYNTHROID, LEVOTHROID) tablet 75 mcg  75 mcg Oral QAC breakfast Rondel Jumbo, PA-C   75 mcg at 06/28/17 0815  . ondansetron (ZOFRAN) tablet 4 mg  4 mg Oral Q6H PRN Rondel Jumbo, PA-C   4 mg at 06/28/17 7510   Or  . ondansetron (ZOFRAN) injection 4 mg  4 mg Intravenous Q6H PRN Sharene Butters E, PA-C   4 mg at 06/28/17 1747  . pantoprazole (PROTONIX) EC tablet 40 mg  40 mg Oral Daily Rondel Jumbo, PA-C   40 mg at 06/28/17 0815  . potassium chloride SA (K-DUR,KLOR-CON) CR tablet 40 mEq  40 mEq Oral Daily Rondel Jumbo, PA-C   40 mEq at 06/28/17 0815  . QUEtiapine (SEROQUEL) tablet 25 mg  25 mg Oral QHS Rondel Jumbo, PA-C   25 mg at 06/27/17 2231  . simvastatin (ZOCOR) tablet 10 mg  10 mg Oral QHS Rondel Jumbo, PA-C   10 mg at 06/27/17 2231  . venlafaxine XR (EFFEXOR-XR) 24 hr capsule 150 mg  150 mg Oral Q breakfast Rondel Jumbo, PA-C   150 mg at 06/28/17 2585  . zolpidem (AMBIEN) tablet 5 mg  5 mg Oral QHS PRN Rise Patience, MD   5 mg at 06/28/17 0117     Discharge Medications: Please see discharge summary for a list of discharge medications.  Relevant Imaging Results:  Relevant Lab Results:   Additional  Information SS#: [id number]  Geralynn Ochs, LCSW

## 2017-06-29 LAB — GLUCOSE, CAPILLARY: GLUCOSE-CAPILLARY: 132 mg/dL — AB (ref 65–99)

## 2017-06-29 LAB — TSH: TSH: 1.146 u[IU]/mL (ref 0.350–4.500)

## 2017-06-29 LAB — FOLATE: FOLATE: 15.8 ng/mL (ref >5.9)

## 2017-06-29 LAB — LACTIC ACID, PLASMA: LACTIC ACID, VENOUS: 1.2 mmol/L (ref 0.5–1.9)

## 2017-06-29 LAB — VITAMIN B12: VITAMIN B 12: 822 pg/mL (ref 180–914)

## 2017-06-29 MED ORDER — VITAMIN B-1 100 MG PO TABS
100.0000 mg | ORAL_TABLET | Freq: Every day | ORAL | Status: DC
  Administered 2017-06-29 – 2017-07-02 (×4): 100 mg via ORAL
  Filled 2017-06-29 (×3): qty 1

## 2017-06-29 NOTE — Clinical Social Work Note (Signed)
Clinical Social Work Assessment  Patient Details  Name: [name] [name] MRN: [id number] Date of Birth: 09-12-1936  Date of referral:  06/28/17               Reason for consult:  Facility Placement, Discharge Planning                Permission sought to share information with:  Facility Sport and exercise psychologist, Family Supports Permission granted to share information::  Yes, Verbal Permission Granted  Name::     English as a second language teacher::  SNF  Relationship::  Husband  Contact Information:     Housing/Transportation Living arrangements for the past 2 months:  [place] of Information:  Patient, Spouse Patient Interpreter Needed:  None Criminal Activity/Legal Involvement Pertinent to Current Situation/Hospitalization:  No - Comment as needed Significant Relationships:  Adult Children, Spouse Lives with:  Self, Spouse Do you feel safe going back to the place where you live?  Yes Need for family participation in patient care:  No (Coment)  Care giving concerns:  Patient currently resides at home with spouse, but needs additional support at this time and short term rehab prior to returning home in order to successfully complete ADLs.   Social Worker assessment / plan:  CSW introduced self to patient and patient's husband, [name], at bedside. CSW explained role and discussed recommendation for SNF placement. Patient and patient's husband indicated that patient had been to [facility] in the past and it had worked out well. CSW will complete referral and follow to facilitate discharge to SNF.  Employment status:  Retired Forensic scientist:  Medicare PT Recommendations:  [place] / Referral to community resources:  [place]  Patient/Family's Response to care:  Patient agreeable to SNF placement.  Patient/Family's Understanding of and Emotional Response to Diagnosis, Current Treatment, and Prognosis:  Patient and patient's husband seem  aware of the patient's need for additional support and therapy at this time to improve strength and mobility. Patient and patient's husband indicated understanding of CSW role in discharge planning.  Emotional Assessment Appearance:  Appears stated age Attitude/Demeanor/Rapport:    Affect (typically observed):  Appropriate Orientation:  Oriented to Self, Oriented to Place, Oriented to  Time, Oriented to Situation Alcohol / Substance use:  Not Applicable Psych involvement (Current and /or in the community):  No (Comment)  Discharge Needs  Concerns to be addressed:  Care Coordination, Discharge Planning Concerns Readmission within the last 30 days:  No Current discharge risk:  Physical Impairment Barriers to Discharge:  Continued Medical Work up   [employer], [place] 06/29/2017, 9:14 AM

## 2017-06-29 NOTE — Progress Notes (Addendum)
Subjective: HPI:                                                                                                                                         [name] [name] is laying comfortably in bed. She is alert and oriented with her husband at bedside. She stated that she had been taking 4 x 1mg  Xanex daily until approximately two weeks ago when she started taking 1 tablet per day. She also stopped drinking alcohol two weeks ago. Husband corroborates dates. She states that she takes 1-2 Effexor per day, depending on her depression status.  On 7/17, she developed tremors that were abated with 0.5mg  Xanex. Later that day, she was found to have an episode of "stiffening extremities - extension and jerking of both arms", after which she became "unresponsive". On rapid response arrival, she had "resps rapid - snoring resps moves all 4's spont - pupils large 37mmERL - deviated upward - grimaces and withdraws to pain"  She was incontinent or urine, pale/cyanotic without tongue biting during this episode. Neurohospitalist team consulted for seizure workup  Additional history from this visit noted Xanex and Ambien abuse per the family, but patient and husband do not acknowledge this. [name] [name] was admitted to [facility] in December, 2017 for AMS where she had a witnessed seizure that lasted 40 seconds and resolved with 1mg  of ativan. Per one progress note: "The patient's daughter told the RN that the "patient drinks alcohol and overmedicates herself." The RN discussed this with the patient who admitted taking extra doses of her Xanax [up to 8 or 9 per day] but denied alcohol use."  Pertinent medications (Home) Xanex 1mg  TID PRN Synthroid .312mcg daily Seroquel 25mg  qhs Effexor 150mg   Pertinent imaging and diagnostics CT head 7/15: No acute abnormality. Cerebral and cerebellar atrophy, chronic small vessel disease. MRI brain 7/15: No acute abnormality.  EEG 7/16: Mild diffuse slowing of the waking background WBC:  12 CK: 725  Physical Examination: Vitals:   06/29/17 0445 06/29/17 0943  BP: (!) 191/75 (!) 191/75  Pulse: 65 71  Resp: 20 18  Temp: 98 F (36.7 C) 98.7 F (37.1 C)    General: WDWN female. Tremors of the face, RUE>LUE HEENT:  Normocephalic, lesions as above, without obvious abnormality.  Normal external eye and conjunctiva.  Normal external ears. Normal external nose, mucus membranes and septum.  Normal pharynx. Cardiovascular: S1, S2 normal, pulses palpable throughout   Pulmonary: chest clear, no wheezing, rales, normal symmetric air entry Abdomen: soft, non-tender Extremities: no joint deformities, effusion, or inflammation. Lesions and bruises on knees, ankles, toes bilaterally Musculoskeletal: no joint tenderness, deformity or swelling  Skin: warm and dry. Lesions as above.  Neurological Examination:  CN: Pupils are equal and round. They are symmetrically reactive from 3-->2 mm. EOMI without nystagmus. Facial sensation is intact to light touch. Face is symmetric at rest with normal strength and  mobility. Hearing is intact to conversational voice. Palate elevates symmetrically and uvula is midline. Voice is normal in tone, pitch and quality. Bilateral SCM and trapezii are 5/5. Tongue is midline with normal bulk and mobility.  Motor: She has a significant postural and intentional tremor right greater than sign left Sensation: Intact to light touch.  DTRs: 2+ symmetric BUE. Dropped patellar reflexes, 1+ AJ bilaterally. Toes downgoing bilaterally.  Coordination: Finger-to-nose with ataxia that appears second to tremor. Heel-to-shin are within normal limits.   Assessment and recommendations per attending neurologist.  Solon Augusta PA-C [facility] [phone]  06/29/2017, 10:02 AM  I have seen the patient and reviewed the above note. The time frame 2 weeks out from her decrease in the dose of Xanax would fit with benzodiazepine withdrawal, and her other symptoms of  tremulousness, anxiety would go along with this as well.  Assessment: 81 year old female with benzodiazepine withdrawal seizure. I would favor a long slow taper of a long acting benzo, rather than continuing to taper Xanax. I have used Klonopin in the past, but the patient states that this made her "head feel funny" and didn't help her anxiety is much and therefore she does not want to take it. It may be worthwhile to ask psychiatry for other recommendations for long acting benzo taper.  Recommendations: 1) long-acting benzodiazepine taper 2) no antiepileptics at this time 3) neurology will sign off, please call with other questions or concerns.  Roland Rack, MD [facility] [phone]  If 7pm- 7am, please page neurology on call as listed in [place].

## 2017-06-29 NOTE — Progress Notes (Signed)
Received bedside report from [place]. Upon assessment, I found patient to be very shaky on R Hand. R great toe has a scab on. R 6middle toes scabbed. 2 Left toes have scabs. There are bilaterally scabs on knees. Bruises R upper arm and R upper chest, R side facial, swelling/ bruises on L forehead. Patient is hard of hearing.

## 2017-06-29 NOTE — Progress Notes (Signed)
[facility]                                                                              Patient Demographics  [name] [name], is a 81 y.o. female, DOB - [birth year], QBH:[id number]  Admit date - 06/26/2017   Admitting Physician Elwin Mocha, MD  Outpatient Primary MD for the patient is [name], MD  Outpatient specialists:   LOS - 3  days   Medical records reviewed and are as summarized below:    Chief Complaint  Patient presents with  . Fall       Brief summary   Patient is a 81 year old female with history of seizures in 2017 felt most likely to be due to benzodiazepine withdrawal after an extensive neurological and psychiatric workup, she has history of anxiety. Patient presented to ED on 7/15 by EMS after unwitnessed fall. She was found on the floor by her husband, face down at 3 AM. He was unable to pick her up on his own, covered her with a blanket and eventually called the EMS at 10 AM. She was mildly confused on arrival, daughter noticed a mild right facial droop. EEG on 7/16 revealed mild diffuse slowing but no epileptiform discharges.  Assessment & Plan    Principal problem Acute encephalopathy and seizures - Likely due to benzodiazepine withdrawals - Patient has a history of prior benzodiazepine withdrawal seizures after an extensive neurological and psychiatric workup in 2017 - EEG on 7/16 revealed mild diffuse slowing but no epileptiform discharges. - Neurology was consulted, on 7/17 she had tremors that decreased after initiation of Xanax. Subsequently she had an episode that was similar to in a seizure-like activity with extension and jerking of both arms and then subsequently became unresponsive - Discussed with neurology today, Dr. Leonel Ramsay, patient refuses Klonopin, she really needs slow and prolonged tapering and weaning off benzodiazepines rather than abrupt cold [place] stopping of Xanax which can cause withdrawal seizures.  Psychiatry consulted for assistance if Valium would be a good choice? And patient will need a slow weaning protocol. - Continue thiamine supplementation, follow thiamine level - TSH 1.1, B12 822, folate 15.8  Dehydration - Improved, continue IV fluids  Multiple falls - Likely due to benzodiazepines and deconditioning - PTOT evaluation recommended skilled nursing facility  Rhabdomyolysis - Improving, continue IV fluid hydration, check CK in a.m.  Failure to thrive - PTOT evaluation recommended skilled nursing facility  Left orbital inferior lateral mass - Incidental finding on MRI of the 06/26/2017 - Outpatient ophthalmology workup  Hypertension - Currently stable  Chronic diastolic CHF - No acute decompensation, currently stable, monitor I's and O's and daily weights   Anxiety, depression - Currently stable, psychiatry consult, called  Hyperglycemia - Monitor CBGs closely  Code Status: Full CODE STATUS DVT Prophylaxis:  Family Communication: Discussed in detail with the patient, all imaging results, lab results explained to the patient or *   Disposition Plan:   Time Spent in minutes  25 minutes  Procedures:  EEG  Consultants:   Neurology Psychiatry  Antimicrobials:   IV Rocephin   Medications  Scheduled Meds: . amLODipine  5  mg Oral Daily  . aspirin EC  81 mg Oral Daily  . atenolol  50 mg Oral Daily  . feeding supplement (ENSURE ENLIVE)  237 mL Oral BID BM  . furosemide  20 mg Oral Q breakfast  . levothyroxine  75 mcg Oral QAC breakfast  . pantoprazole  40 mg Oral Daily  . potassium chloride SA  40 mEq Oral Daily  . QUEtiapine  25 mg Oral QHS  . simvastatin  10 mg Oral QHS  . venlafaxine XR  150 mg Oral Q breakfast   Continuous Infusions: . sodium chloride 125 mL/hr at 06/29/17 0016  . cefTRIAXone (ROCEPHIN)  IV Stopped (06/28/17 1540)   PRN Meds:.acetaminophen **OR** acetaminophen, ALPRAZolam, bisacodyl, hydrALAZINE, ondansetron **OR**  ondansetron (ZOFRAN) IV, zolpidem   Antibiotics   Anti-infectives    Start     Dose/Rate Route Frequency Ordered Stop   06/27/17 1400  cefTRIAXone (ROCEPHIN) 1 g in dextrose 5 % 50 mL IVPB     1 g 100 mL/hr over 30 Minutes Intravenous Every 24 hours 06/26/17 1444     06/26/17 1330  cefTRIAXone (ROCEPHIN) 1 g in dextrose 5 % 50 mL IVPB     1 g 100 mL/hr over 30 Minutes Intravenous  Once 06/26/17 1317 06/26/17 1433        Subjective:   [name] [name] was seen and examined today.  Slurred speech improving Patient denies dizziness, chest pain, shortness of breath, abdominal pain, N/V/D/C, new weakness, numbess, tingling  Objective:   Vitals:   06/28/17 2100 06/29/17 0100 06/29/17 0445 06/29/17 0943  BP: (!) 189/92 (!) 172/74 (!) 191/75 (!) 191/75  Pulse: 69 76 65 71  Resp:  20 20 18   Temp: 98.4 F (36.9 C) 97.9 F (36.6 C) 98 F (36.7 C) 98.7 F (37.1 C)  TempSrc: Oral Oral Oral Oral  SpO2: 100% 97% 96% 96%  Weight:      Height:       No intake or output data in the 24 hours ending 06/29/17 1418   Wt Readings from Last 3 Encounters:  06/28/17 75.2 kg (165 lb 12.6 oz)  03/15/17 63 kg (138 lb 14.4 oz)  02/09/17 66.2 kg (146 lb)     Exam  General: Alert and oriented x 3, NAD  Eyes: PERRLA, EOMI, Anicteric Sclera,  HEENT:  Atraumatic, normocephalic, normal oropharynx  Cardiovascular: S1 S2 auscultated, no rubs, murmurs or gallops. Regular rate and rhythm.  Respiratory: Clear to auscultation bilaterally, no wheezing, rales or rhonchi  Gastrointestinal: Soft, nontender, nondistended, + bowel sounds  Ext: no pedal edema bilaterally  Neuro: No new deficits  Musculoskeletal: No digital cyanosis, clubbing  Skin: No rashes  Psych: Normal affect and demeanor, alert and oriented x3    Data Reviewed:  I have personally reviewed following labs and imaging studies  Micro Results Recent Results (from the past 240 hour(s))  Urine culture     Status: None    Collection Time: 06/26/17  1:20 PM  Result Value Ref Range Status   Specimen Description URINE, CATHETERIZED  Final   Special Requests NONE  Final   Culture NO GROWTH  Final   Report Status 06/27/2017 FINAL  Final  Culture, blood (Routine X 2) w Reflex to ID Panel     Status: None (Preliminary result)   Collection Time: 06/26/17  4:46 PM  Result Value Ref Range Status   Specimen Description BLOOD LEFT HAND  Final   Special Requests IN PEDIATRIC BOTTLE Blood Culture adequate volume  Final   Culture NO GROWTH 2 DAYS  Final   Report Status PENDING  Incomplete  Culture, blood (Routine X 2) w Reflex to ID Panel     Status: None (Preliminary result)   Collection Time: 06/26/17  5:41 PM  Result Value Ref Range Status   Specimen Description BLOOD RIGHT ANTECUBITAL  Final   Special Requests IN PEDIATRIC BOTTLE Blood Culture adequate volume  Final   Culture NO GROWTH 2 DAYS  Final   Report Status PENDING  Incomplete    Radiology Reports Dg Chest 2 View  Result Date: 06/26/2017 CLINICAL DATA:  Fall, altered mental status EXAM: CHEST  2 VIEW COMPARISON:  03/11/2017 FINDINGS: Mild cardiomegaly without CHF, focal pneumonia, collapse, or consolidation. No effusion or pneumothorax. Trachea is midline. Postop changes of the thoracolumbar spine, left clavicle and left shoulder. Monitor leads overlie the chest. Remote cholecystectomy noted. IMPRESSION: Cardiomegaly without acute chest process Electronically Signed   By: Jerilynn Mages.  Shick M.D.   On: 06/26/2017 12:22   Ct Head Wo Contrast  Result Date: 06/26/2017 CLINICAL DATA:  Slurred speech. Possible right facial droop. Found on the floor this morning at 3 a.m. EXAM: CT HEAD WITHOUT CONTRAST CT CERVICAL SPINE WITHOUT CONTRAST TECHNIQUE: Multidetector CT imaging of the head and cervical spine was performed following the standard protocol without intravenous contrast. Multiplanar CT image reconstructions of the cervical spine were also generated. COMPARISON:  Brain  MR dated 11/11/2016. Head CT dated 11/10/2016 and cervical spine CT dated 09/06/2016. FINDINGS: CT HEAD FINDINGS Brain: Diffusely enlarged ventricles and subarachnoid spaces. Patchy white matter low density in both cerebral hemispheres. No intracranial hemorrhage, mass lesion or CT evidence of acute infarction. Vascular: No hyperdense vessel or unexpected calcification. Skull: Normal. Negative for fracture or focal lesion. Sinuses/Orbits: Small amount of fluid in the left maxillary sinus. Unremarkable orbits. Other: None. CT CERVICAL SPINE FINDINGS Alignment: Normal. Skull base and vertebrae: No acute fracture. No primary bone lesion or focal pathologic process. Soft tissues and spinal canal: No prevertebral fluid or swelling. No visible canal hematoma. Disc levels: Stable disc space narrowing and mild anterior and minimal posterior spur formation at the C5-6 and C6-7 levels. Facet degenerative changes at multiple levels. Upper chest: Stable mild patchy interstitial prominence at both lung apices. Other: Post carotid endarterectomy changes on the left and mild right carotid artery calcifications. IMPRESSION: 1. No skull fracture or intracranial hemorrhage. 2. No cervical spine fracture or subluxation. 3. Stable mild diffuse cerebral and cerebellar atrophy and mild chronic small vessel white matter ischemic changes in both cerebral hemispheres. 4. Stable cervical spine degenerative changes. 5. Mild right carotid artery atheromatous calcifications. 6. Stable mild chronic interstitial lung disease. Electronically Signed   By: Claudie Revering M.D.   On: 06/26/2017 12:15   Ct Cervical Spine Wo Contrast  Result Date: 06/26/2017 CLINICAL DATA:  Slurred speech. Possible right facial droop. Found on the floor this morning at 3 a.m. EXAM: CT HEAD WITHOUT CONTRAST CT CERVICAL SPINE WITHOUT CONTRAST TECHNIQUE: Multidetector CT imaging of the head and cervical spine was performed following the standard protocol without  intravenous contrast. Multiplanar CT image reconstructions of the cervical spine were also generated. COMPARISON:  Brain MR dated 11/11/2016. Head CT dated 11/10/2016 and cervical spine CT dated 09/06/2016. FINDINGS: CT HEAD FINDINGS Brain: Diffusely enlarged ventricles and subarachnoid spaces. Patchy white matter low density in both cerebral hemispheres. No intracranial hemorrhage, mass lesion or CT evidence of acute infarction. Vascular: No hyperdense vessel or unexpected calcification. Skull: Normal. Negative  for fracture or focal lesion. Sinuses/Orbits: Small amount of fluid in the left maxillary sinus. Unremarkable orbits. Other: None. CT CERVICAL SPINE FINDINGS Alignment: Normal. Skull base and vertebrae: No acute fracture. No primary bone lesion or focal pathologic process. Soft tissues and spinal canal: No prevertebral fluid or swelling. No visible canal hematoma. Disc levels: Stable disc space narrowing and mild anterior and minimal posterior spur formation at the C5-6 and C6-7 levels. Facet degenerative changes at multiple levels. Upper chest: Stable mild patchy interstitial prominence at both lung apices. Other: Post carotid endarterectomy changes on the left and mild right carotid artery calcifications. IMPRESSION: 1. No skull fracture or intracranial hemorrhage. 2. No cervical spine fracture or subluxation. 3. Stable mild diffuse cerebral and cerebellar atrophy and mild chronic small vessel white matter ischemic changes in both cerebral hemispheres. 4. Stable cervical spine degenerative changes. 5. Mild right carotid artery atheromatous calcifications. 6. Stable mild chronic interstitial lung disease. Electronically Signed   By: Claudie Revering M.D.   On: 06/26/2017 12:15   Mr Brain Wo Contrast  Result Date: 06/26/2017 CLINICAL DATA:  Unwitnessed fall. Patient found down. Query seizure, infection, versus overdose. EXAM: MRI HEAD WITHOUT CONTRAST TECHNIQUE: Multiplanar, multiecho pulse sequences of the  brain and surrounding structures were obtained without intravenous contrast. COMPARISON:  MR brain 11/11/2016.  CT head 06/26/2017. FINDINGS: Brain: No evidence for acute infarction, hemorrhage, mass lesion, hydrocephalus, or extra-axial fluid. Generalized atrophy. Chronic microvascular ischemic change. Vascular: Normal flow voids. Skull and upper cervical spine: Normal marrow signal. Sinuses/Orbits: Negative sinuses. In the inferolateral orbit on the LEFT there is an 11 x 10 x 11 mm rounded mass, which may AP extraconal or may be intimately associated with an extra ocular muscle (inferior oblique close). On CT the lesion is hyperdense. On MR there is no significant restriction. No associated sinus or osseous pathology. Lacrimal glands appear normal. Compared with the prior CT and MR studies, the lesion has grown since 2017. Other: None. IMPRESSION: Atrophy and small vessel disease similar to priors. No acute intracranial findings. LEFT orbital inferolateral mass. This is either extraconal or associated with the muscle. This represents an unusual lesion, and the differential is wide. Considerations would include unusual thyroid orbitopathy, lymphoma, orbital pseudotumor, metastasis, sarcoidosis, neurofibroma, or meningioma. Recommend ophthalmologic evaluation. It is possible the lesion could be better characterized with sonography. Electronically Signed   By: Staci Righter M.D.   On: 06/26/2017 15:51    Lab Data:  CBC:  Recent Labs Lab 06/26/17 1143 06/26/17 1204 06/27/17 0325 06/28/17 1845  WBC 15.1*  --  9.6 12.0*  NEUTROABS 13.7*  --   --   --   HGB 12.9 13.3 11.3* 12.4  HCT 39.5 39.0 35.7* 39.1  MCV 85.9  --  86.9 87.3  PLT 226  --  189 756   Basic Metabolic Panel:  Recent Labs Lab 06/26/17 1143 06/26/17 1204 06/26/17 1701 06/27/17 0325 06/28/17 0620 06/28/17 1845  NA 142 144  --  141 139 141  K 3.9 3.8  --  3.5 3.4* 3.5  CL 111 110  --  112* 110 107  CO2 19*  --   --  20* 22 25    GLUCOSE 120* 121*  --  106* 108* 150*  BUN 14 16  --  10 8 12   CREATININE 1.13* 1.00  --  0.93 0.78 0.94  CALCIUM 9.2  --   --  8.4* 8.1* 8.6*  MG  --   --  1.8  --   --   --  PHOS  --   --  4.0  --   --   --    GFR: Estimated Creatinine Clearance: 45.8 mL/min (by C-G formula based on SCr of 0.94 mg/dL). Liver Function Tests:  Recent Labs Lab 06/26/17 1143 06/27/17 0325  AST 68* 60*  ALT 36 36  ALKPHOS 96 78  BILITOT 1.2 0.8  PROT 6.5 5.7*  ALBUMIN 3.4* 3.0*   No results for input(s): LIPASE, AMYLASE in the last 168 hours. No results for input(s): AMMONIA in the last 168 hours. Coagulation Profile:  Recent Labs Lab 06/27/17 0325  INR 1.01   Cardiac Enzymes:  Recent Labs Lab 06/26/17 1143 06/27/17 0325 06/28/17 0620  CKTOTAL 2,013* 1,877* 725*   BNP (last 3 results) No results for input(s): PROBNP in the last 8760 hours. HbA1C:  Recent Labs  06/26/17 1701  HGBA1C 5.4   CBG:  Recent Labs Lab 06/27/17 0612 06/28/17 1703  GLUCAP 214* 132*   Lipid Profile: No results for input(s): CHOL, HDL, LDLCALC, TRIG, CHOLHDL, LDLDIRECT in the last 72 hours. Thyroid Function Tests:  Recent Labs  06/29/17 1028  TSH 1.146   Anemia Panel:  Recent Labs  06/29/17 1028  VITAMINB12 822  FOLATE 15.8   Urine analysis:    Component Value Date/Time   COLORURINE YELLOW 06/26/2017 [place] 06/26/2017 1322   LABSPEC 1.014 06/26/2017 1322   PHURINE 5.0 06/26/2017 1322   GLUCOSEU NEGATIVE 06/26/2017 1322   HGBUR MODERATE (A) 06/26/2017 1322   BILIRUBINUR NEGATIVE 06/26/2017 1322   KETONESUR 5 (A) 06/26/2017 1322   PROTEINUR 100 (A) 06/26/2017 1322   UROBILINOGEN 0.2 09/04/2014 1340   NITRITE NEGATIVE 06/26/2017 1322   LEUKOCYTESUR NEGATIVE 06/26/2017 1322     [name] [name] M.D. [facility] 06/29/2017, 2:18 PM  Pager: [phone] Between 7am to 7pm - call Pager - [phone]  After 7pm go to [email or web address] - password [email or web address]  Call  night coverage person covering after 7pm

## 2017-06-30 DIAGNOSIS — Z813 Family history of other psychoactive substance abuse and dependence: Secondary | ICD-10-CM

## 2017-06-30 DIAGNOSIS — R4182 Altered mental status, unspecified: Secondary | ICD-10-CM

## 2017-06-30 DIAGNOSIS — I5032 Chronic diastolic (congestive) heart failure: Secondary | ICD-10-CM

## 2017-06-30 DIAGNOSIS — Z811 Family history of alcohol abuse and dependence: Secondary | ICD-10-CM

## 2017-06-30 DIAGNOSIS — M1990 Unspecified osteoarthritis, unspecified site: Secondary | ICD-10-CM

## 2017-06-30 DIAGNOSIS — F419 Anxiety disorder, unspecified: Secondary | ICD-10-CM

## 2017-06-30 DIAGNOSIS — Z818 Family history of other mental and behavioral disorders: Secondary | ICD-10-CM

## 2017-06-30 DIAGNOSIS — F13180 Sedative, hypnotic or anxiolytic abuse with sedative, hypnotic or anxiolytic-induced anxiety disorder: Secondary | ICD-10-CM | POA: Diagnosis present

## 2017-06-30 DIAGNOSIS — W19XXXA Unspecified fall, initial encounter: Secondary | ICD-10-CM

## 2017-06-30 MED ORDER — HYDRALAZINE HCL 20 MG/ML IJ SOLN
10.0000 mg | Freq: Three times a day (TID) | INTRAMUSCULAR | Status: DC | PRN
  Administered 2017-06-30 – 2017-07-01 (×2): 10 mg via INTRAVENOUS
  Filled 2017-06-30 (×2): qty 1

## 2017-06-30 MED ORDER — ALPRAZOLAM 1 MG PO TABS: 1.0000 mg | ORAL_TABLET | Freq: Three times a day (TID) | ORAL | 0 refills | Status: DC | PRN

## 2017-06-30 MED ORDER — ZOLPIDEM TARTRATE 5 MG PO TABS: 5.0000 mg | ORAL_TABLET | Freq: Every evening | ORAL | 0 refills | Status: DC | PRN

## 2017-06-30 MED ORDER — ACETAMINOPHEN 325 MG PO TABS: 650.0000 mg | ORAL_TABLET | Freq: Four times a day (QID) | ORAL | Status: DC | PRN

## 2017-06-30 MED ORDER — CHLORDIAZEPOXIDE HCL 5 MG PO CAPS
10.0000 mg | ORAL_CAPSULE | Freq: Three times a day (TID) | ORAL | Status: DC
  Filled 2017-06-30: qty 1

## 2017-06-30 MED ORDER — ENSURE ENLIVE PO LIQD: 237.0000 mL | Freq: Two times a day (BID) | ORAL | 12 refills | Status: DC

## 2017-06-30 MED ORDER — ALPRAZOLAM 0.5 MG PO TABS
0.5000 mg | ORAL_TABLET | Freq: Three times a day (TID) | ORAL | Status: DC | PRN
  Administered 2017-07-01: 0.5 mg via ORAL
  Filled 2017-06-30: qty 1

## 2017-06-30 MED ORDER — CHLORDIAZEPOXIDE HCL 5 MG PO CAPS
10.0000 mg | ORAL_CAPSULE | Freq: Three times a day (TID) | ORAL | Status: DC
  Administered 2017-07-01 – 2017-07-02 (×5): 10 mg via ORAL
  Filled 2017-06-30 (×5): qty 2

## 2017-06-30 NOTE — Progress Notes (Signed)
[facility]                                                                              Patient Demographics  [name] [name], is a 81 y.o. female, DOB - [birth year], UUV:[id number]  Admit date - 06/26/2017   Admitting Physician Elwin Mocha, MD  Outpatient Primary MD for the patient is [name], MD  Outpatient specialists:   LOS - 4  days   Medical records reviewed and are as summarized below:    Chief Complaint  Patient presents with  . Fall       Brief summary   Patient is a 81 year old female with history of seizures in 2017 felt most likely to be due to benzodiazepine withdrawal after an extensive neurological and psychiatric workup, she has history of anxiety. Patient presented to ED on 7/15 by EMS after unwitnessed fall. She was found on the floor by her husband, face down at 3 AM. He was unable to pick her up on his own, covered her with a blanket and eventually called the EMS at 10 AM. She was mildly confused on arrival, daughter noticed a mild right facial droop. EEG on 7/16 revealed mild diffuse slowing but no epileptiform discharges.  Assessment & Plan    Principal problem Acute encephalopathy and seizures - Likely due to benzodiazepine withdrawals - Patient has a history of prior benzodiazepine withdrawal seizures after an extensive neurological and psychiatric workup in 2017 - EEG on 7/16 revealed mild diffuse slowing but no epileptiform discharges. - Neurology was consulted, on 7/17 she had tremors that decreased after initiation of Xanax. Subsequently she had with extension and jerking of both arms and then became unresponsive - Patient has a history of Xanax  and alcohol abuse per chart review. Psychiatry was consulted as patient needs a safe and slow weaning protocol of Xanax to avoid the withdrawal seizures. - Continue thiamine supplementation, follow thiamine level - TSH 1.1, B12 822, folate 15.8 - Discussed in detail with psychiatry,  Dr Macon Large, will start taper off Xanax to 0.5 mg q 8hrs PRN and start Librium 10 mg scheduled TID.    Dehydration - Improved, continue IV fluids  Multiple falls - Likely due to benzodiazepines and deconditioning - PTOT evaluation recommended skilled nursing facility  Rhabdomyolysis - Seeking improving, DC IV fluids  Failure to thrive - PTOT evaluation recommended skilled nursing facility  Left orbital inferior lateral mass - Incidental finding on MRI of the 06/26/2017 - Outpatient ophthalmology workup  Hypertension - Currently stable  Chronic diastolic CHF - No acute decompensation, currently stable, monitor I's and O's and daily weights -Currently euvolemic, DC IV fluids   Hyperglycemia - Monitor CBGs closely  Code Status: Full CODE STATUS DVT Prophylaxis:  Family Communication: Discussed in detail with the patient, all imaging results, lab results explained to the patient   Disposition Plan: Will await until cleared by psych  Time Spent in minutes  25 minutes  Procedures:  EEG  Consultants:   Neurology Psychiatry  Antimicrobials:   IV Rocephin   Medications  Scheduled Meds: . amLODipine  5 mg Oral Daily  . aspirin EC  81 mg Oral Daily  . atenolol  50 mg Oral Daily  . chlordiazePOXIDE  10 mg Oral TID  . feeding supplement (ENSURE ENLIVE)  237 mL Oral BID BM  . furosemide  20 mg Oral Q breakfast  . levothyroxine  75 mcg Oral QAC breakfast  . pantoprazole  40 mg Oral Daily  . potassium chloride SA  40 mEq Oral Daily  . QUEtiapine  25 mg Oral QHS  . simvastatin  10 mg Oral QHS  . thiamine  100 mg Oral Daily  . venlafaxine XR  150 mg Oral Q breakfast   Continuous Infusions: . sodium chloride 1,000 mL (06/30/17 0829)  . cefTRIAXone (ROCEPHIN)  IV 1 g (06/30/17 1450)   PRN Meds:.acetaminophen **OR** acetaminophen, ALPRAZolam, bisacodyl, hydrALAZINE, ondansetron **OR** ondansetron (ZOFRAN) IV, zolpidem   Antibiotics   Anti-infectives    Start      Dose/Rate Route Frequency Ordered Stop   06/27/17 1400  cefTRIAXone (ROCEPHIN) 1 g in dextrose 5 % 50 mL IVPB     1 g 100 mL/hr over 30 Minutes Intravenous Every 24 hours 06/26/17 1444     06/26/17 1330  cefTRIAXone (ROCEPHIN) 1 g in dextrose 5 % 50 mL IVPB     1 g 100 mL/hr over 30 Minutes Intravenous  Once 06/26/17 1317 06/26/17 1433        Subjective:   [name] [name] was seen and examined today.Feeling much better, still a bit shaky. Slurred speech improving. No repeat seizures. denies dizziness, chest pain, shortness of breath, abdominal pain, N/V/D/C, new weakness, numbess, tingling  Objective:   Vitals:   06/30/17 0030 06/30/17 0449 06/30/17 0800 06/30/17 1406  BP: (!) 164/60 (!) 156/56 (!) 150/65 (!) 171/64  Pulse: 69 65 66 (!) 56  Resp: 18 18  20   Temp: 98 F (36.7 C) 98.2 F (36.8 C) 98.1 F (36.7 C) 97.8 F (36.6 C)  TempSrc: Oral Oral Oral Oral  SpO2: 100% 100% 98% 100%  Weight:      Height:       No intake or output data in the 24 hours ending 06/30/17 1458   Wt Readings from Last 3 Encounters:  06/28/17 75.2 kg (165 lb 12.6 oz)  03/15/17 63 kg (138 lb 14.4 oz)  02/09/17 66.2 kg (146 lb)     Exam Physical Exam  General: Alert and oriented x 3, NAD  Eyes:  HEENT:   Cardiovascular: S1 S2 auscultated, no rubs, murmurs or gallops. Regular rate and rhythm. No pedal edema b/l  Respiratory: Clear to auscultation bilaterally, no wheezing, rales or rhonchi  Gastrointestinal: Soft, nontender, nondistended, + bowel sounds  Ext: no pedal edema bilaterally  Neuro: moving all 4 extremities, slightly tremulous   Musculoskeletal: No digital cyanosis, clubbing  Skin: No rashes  Psych: Normal affect and demeanor, alert and oriented x3    Data Reviewed:  I have personally reviewed following labs and imaging studies  Micro Results Recent Results (from the past 240 hour(s))  Urine culture     Status: None   Collection Time: 06/26/17  1:20 PM  Result  Value Ref Range Status   Specimen Description URINE, CATHETERIZED  Final   Special Requests NONE  Final   Culture NO GROWTH  Final   Report Status 06/27/2017 FINAL  Final  Culture, blood (Routine X 2) w Reflex to ID Panel     Status: None (Preliminary result)   Collection Time: 06/26/17  4:46 PM  Result Value Ref Range Status  Specimen Description BLOOD LEFT HAND  Final   Special Requests IN PEDIATRIC BOTTLE Blood Culture adequate volume  Final   Culture NO GROWTH 4 DAYS  Final   Report Status PENDING  Incomplete  Culture, blood (Routine X 2) w Reflex to ID Panel     Status: None (Preliminary result)   Collection Time: 06/26/17  5:41 PM  Result Value Ref Range Status   Specimen Description BLOOD RIGHT ANTECUBITAL  Final   Special Requests IN PEDIATRIC BOTTLE Blood Culture adequate volume  Final   Culture NO GROWTH 4 DAYS  Final   Report Status PENDING  Incomplete    Radiology Reports Dg Chest 2 View  Result Date: 06/26/2017 CLINICAL DATA:  Fall, altered mental status EXAM: CHEST  2 VIEW COMPARISON:  03/11/2017 FINDINGS: Mild cardiomegaly without CHF, focal pneumonia, collapse, or consolidation. No effusion or pneumothorax. Trachea is midline. Postop changes of the thoracolumbar spine, left clavicle and left shoulder. Monitor leads overlie the chest. Remote cholecystectomy noted. IMPRESSION: Cardiomegaly without acute chest process Electronically Signed   By: Jerilynn Mages.  Shick M.D.   On: 06/26/2017 12:22   Ct Head Wo Contrast  Result Date: 06/26/2017 CLINICAL DATA:  Slurred speech. Possible right facial droop. Found on the floor this morning at 3 a.m. EXAM: CT HEAD WITHOUT CONTRAST CT CERVICAL SPINE WITHOUT CONTRAST TECHNIQUE: Multidetector CT imaging of the head and cervical spine was performed following the standard protocol without intravenous contrast. Multiplanar CT image reconstructions of the cervical spine were also generated. COMPARISON:  Brain MR dated 11/11/2016. Head CT dated  11/10/2016 and cervical spine CT dated 09/06/2016. FINDINGS: CT HEAD FINDINGS Brain: Diffusely enlarged ventricles and subarachnoid spaces. Patchy white matter low density in both cerebral hemispheres. No intracranial hemorrhage, mass lesion or CT evidence of acute infarction. Vascular: No hyperdense vessel or unexpected calcification. Skull: Normal. Negative for fracture or focal lesion. Sinuses/Orbits: Small amount of fluid in the left maxillary sinus. Unremarkable orbits. Other: None. CT CERVICAL SPINE FINDINGS Alignment: Normal. Skull base and vertebrae: No acute fracture. No primary bone lesion or focal pathologic process. Soft tissues and spinal canal: No prevertebral fluid or swelling. No visible canal hematoma. Disc levels: Stable disc space narrowing and mild anterior and minimal posterior spur formation at the C5-6 and C6-7 levels. Facet degenerative changes at multiple levels. Upper chest: Stable mild patchy interstitial prominence at both lung apices. Other: Post carotid endarterectomy changes on the left and mild right carotid artery calcifications. IMPRESSION: 1. No skull fracture or intracranial hemorrhage. 2. No cervical spine fracture or subluxation. 3. Stable mild diffuse cerebral and cerebellar atrophy and mild chronic small vessel white matter ischemic changes in both cerebral hemispheres. 4. Stable cervical spine degenerative changes. 5. Mild right carotid artery atheromatous calcifications. 6. Stable mild chronic interstitial lung disease. Electronically Signed   By: Claudie Revering M.D.   On: 06/26/2017 12:15   Ct Cervical Spine Wo Contrast  Result Date: 06/26/2017 CLINICAL DATA:  Slurred speech. Possible right facial droop. Found on the floor this morning at 3 a.m. EXAM: CT HEAD WITHOUT CONTRAST CT CERVICAL SPINE WITHOUT CONTRAST TECHNIQUE: Multidetector CT imaging of the head and cervical spine was performed following the standard protocol without intravenous contrast. Multiplanar CT image  reconstructions of the cervical spine were also generated. COMPARISON:  Brain MR dated 11/11/2016. Head CT dated 11/10/2016 and cervical spine CT dated 09/06/2016. FINDINGS: CT HEAD FINDINGS Brain: Diffusely enlarged ventricles and subarachnoid spaces. Patchy white matter low density in both cerebral hemispheres. No intracranial  hemorrhage, mass lesion or CT evidence of acute infarction. Vascular: No hyperdense vessel or unexpected calcification. Skull: Normal. Negative for fracture or focal lesion. Sinuses/Orbits: Small amount of fluid in the left maxillary sinus. Unremarkable orbits. Other: None. CT CERVICAL SPINE FINDINGS Alignment: Normal. Skull base and vertebrae: No acute fracture. No primary bone lesion or focal pathologic process. Soft tissues and spinal canal: No prevertebral fluid or swelling. No visible canal hematoma. Disc levels: Stable disc space narrowing and mild anterior and minimal posterior spur formation at the C5-6 and C6-7 levels. Facet degenerative changes at multiple levels. Upper chest: Stable mild patchy interstitial prominence at both lung apices. Other: Post carotid endarterectomy changes on the left and mild right carotid artery calcifications. IMPRESSION: 1. No skull fracture or intracranial hemorrhage. 2. No cervical spine fracture or subluxation. 3. Stable mild diffuse cerebral and cerebellar atrophy and mild chronic small vessel white matter ischemic changes in both cerebral hemispheres. 4. Stable cervical spine degenerative changes. 5. Mild right carotid artery atheromatous calcifications. 6. Stable mild chronic interstitial lung disease. Electronically Signed   By: Claudie Revering M.D.   On: 06/26/2017 12:15   Mr Brain Wo Contrast  Result Date: 06/26/2017 CLINICAL DATA:  Unwitnessed fall. Patient found down. Query seizure, infection, versus overdose. EXAM: MRI HEAD WITHOUT CONTRAST TECHNIQUE: Multiplanar, multiecho pulse sequences of the brain and surrounding structures were  obtained without intravenous contrast. COMPARISON:  MR brain 11/11/2016.  CT head 06/26/2017. FINDINGS: Brain: No evidence for acute infarction, hemorrhage, mass lesion, hydrocephalus, or extra-axial fluid. Generalized atrophy. Chronic microvascular ischemic change. Vascular: Normal flow voids. Skull and upper cervical spine: Normal marrow signal. Sinuses/Orbits: Negative sinuses. In the inferolateral orbit on the LEFT there is an 11 x 10 x 11 mm rounded mass, which may AP extraconal or may be intimately associated with an extra ocular muscle (inferior oblique close). On CT the lesion is hyperdense. On MR there is no significant restriction. No associated sinus or osseous pathology. Lacrimal glands appear normal. Compared with the prior CT and MR studies, the lesion has grown since 2017. Other: None. IMPRESSION: Atrophy and small vessel disease similar to priors. No acute intracranial findings. LEFT orbital inferolateral mass. This is either extraconal or associated with the muscle. This represents an unusual lesion, and the differential is wide. Considerations would include unusual thyroid orbitopathy, lymphoma, orbital pseudotumor, metastasis, sarcoidosis, neurofibroma, or meningioma. Recommend ophthalmologic evaluation. It is possible the lesion could be better characterized with sonography. Electronically Signed   By: Staci Righter M.D.   On: 06/26/2017 15:51    Lab Data:  CBC:  Recent Labs Lab 06/26/17 1143 06/26/17 1204 06/27/17 0325 06/28/17 1845  WBC 15.1*  --  9.6 12.0*  NEUTROABS 13.7*  --   --   --   HGB 12.9 13.3 11.3* 12.4  HCT 39.5 39.0 35.7* 39.1  MCV 85.9  --  86.9 87.3  PLT 226  --  189 616   Basic Metabolic Panel:  Recent Labs Lab 06/26/17 1143 06/26/17 1204 06/26/17 1701 06/27/17 0325 06/28/17 0620 06/28/17 1845  NA 142 144  --  141 139 141  K 3.9 3.8  --  3.5 3.4* 3.5  CL 111 110  --  112* 110 107  CO2 19*  --   --  20* 22 25  GLUCOSE 120* 121*  --  106* 108*  150*  BUN 14 16  --  10 8 12   CREATININE 1.13* 1.00  --  0.93 0.78 0.94  CALCIUM 9.2  --   --  8.4* 8.1* 8.6*  MG  --   --  1.8  --   --   --   PHOS  --   --  4.0  --   --   --    GFR: Estimated Creatinine Clearance: 45.8 mL/min (by C-G formula based on SCr of 0.94 mg/dL). Liver Function Tests:  Recent Labs Lab 06/26/17 1143 06/27/17 0325  AST 68* 60*  ALT 36 36  ALKPHOS 96 78  BILITOT 1.2 0.8  PROT 6.5 5.7*  ALBUMIN 3.4* 3.0*   No results for input(s): LIPASE, AMYLASE in the last 168 hours. No results for input(s): AMMONIA in the last 168 hours. Coagulation Profile:  Recent Labs Lab 06/27/17 0325  INR 1.01   Cardiac Enzymes:  Recent Labs Lab 06/26/17 1143 06/27/17 0325 06/28/17 0620  CKTOTAL 2,013* 1,877* 725*   BNP (last 3 results) No results for input(s): PROBNP in the last 8760 hours. HbA1C: No results for input(s): HGBA1C in the last 72 hours. CBG:  Recent Labs Lab 06/27/17 0612 06/28/17 1703  GLUCAP 214* 132*   Lipid Profile: No results for input(s): CHOL, HDL, LDLCALC, TRIG, CHOLHDL, LDLDIRECT in the last 72 hours. Thyroid Function Tests:  Recent Labs  06/29/17 1028  TSH 1.146   Anemia Panel:  Recent Labs  06/29/17 1028  VITAMINB12 822  FOLATE 15.8   Urine analysis:    Component Value Date/Time   COLORURINE YELLOW 06/26/2017 [place] 06/26/2017 1322   LABSPEC 1.014 06/26/2017 1322   PHURINE 5.0 06/26/2017 1322   GLUCOSEU NEGATIVE 06/26/2017 1322   HGBUR MODERATE (A) 06/26/2017 1322   BILIRUBINUR NEGATIVE 06/26/2017 1322   KETONESUR 5 (A) 06/26/2017 1322   PROTEINUR 100 (A) 06/26/2017 1322   UROBILINOGEN 0.2 09/04/2014 1340   NITRITE NEGATIVE 06/26/2017 1322   LEUKOCYTESUR NEGATIVE 06/26/2017 1322     Adiya Selmer M.D. [facility] 06/30/2017, 2:58 PM  Pager: [phone] Between 7am to 7pm - call Pager - [phone]  After 7pm go to [email or web address] - password [email or web address]  Call night coverage person  covering after 7pm

## 2017-06-30 NOTE — Progress Notes (Signed)
MD made aware of BP elevated../ orders for hydralazine changed to be given if systolic greater than 154./ will continue to monitor

## 2017-06-30 NOTE — Care Management Important Message (Signed)
Important Message  Patient Details  Name: [name] [name] MRN: [id number] Date of Birth: October 03, 1936   Medicare Important Message Given:  Yes    Dessirae Scarola [address] 06/30/2017, 12:27 PM

## 2017-06-30 NOTE — Consult Note (Addendum)
[place] [facility] Consult   Reason for Consult:  Anxiety disorder and alcohol and xanax abuse Referring Physician:  Dr. 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 Fall [W19.XXXA] 06/26/2017  . Intractable nausea and vomiting [R11.2] 03/11/2017  . Acute lower UTI [N39.0] 03/11/2017  . Carotid stenosis [I65.29] 12/28/2016  . Sedative, hypnotic, or anxiolytic withdrawal delirium ([place]) [F13.931] 11/15/2016  . Paroxysmal atrial fibrillation (HCC) [I48.0]   . Cerebrovascular disease [I67.9]   . Alcohol withdrawal delirium ([place]) [F10.231]   . Fever [R50.9]   . Acute encephalopathy [G93.40] 11/10/2016  . Hypothyroidism [E03.9] 11/10/2016  . Hypertensive urgency [I16.0] 11/10/2016  . Altered mental state [R41.82] 11/10/2016  . Seizure ([place]) [R56.9] 11/10/2016  . Chronic diastolic CHF (congestive heart failure) ([place]) [I50.32]   . Left carotid stenosis [I65.22] 10/29/2016  . S/P total knee replacement [Z96.659] 03/10/2016  . 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 Ileus ([place]) [K56.7] 09/05/2014  . Leukocytosis [D72.829] 09/05/2014  . Vomiting [R11.10] 09/04/2014  . Pulmonary edema [J81.1] 09/04/2014  . Hypokalemia [E87.6] 08/28/2014  . Enteritis due to Clostridium difficile [A04.72] 08/27/2014  . Common bile duct (CBD) stricture [K83.1] 08/27/2014  . Protein-calorie malnutrition, severe ([place]) [E43] 08/24/2014  . Obstructive jaundice [K83.8] 08/23/2014  . Acute cholangitis [K83.0] 08/23/2014  . ARF (acute renal failure) ([place]) [N17.9] 08/23/2014  . Coagulopathy ([place]) [D68.9] 08/23/2014  . Back pain [M54.9]   . Arthritis [M19.90]   . Hypertension [I10]   . GERD (gastroesophageal reflux disease) [K21.9]   . Anxiety [F41.9]   . IBS (irritable bowel syndrome) [K58.9]   . Hernia of Right posterior flank  [K45.8] 04/18/2012    Total Time spent with patient: 45 minutes  Subjective:   [name] is a 81 y.o. female patient admitted with depression, alcohol and xanax abuse.  HPI: [name] [name] is a 81 years old female presented with unwitnessed fall on admission. Patient seen, chart reviewed and case discussed with the Dr. Tana Coast for this face-to-face psychiatric consultation and evaluation. Patient lives with her husband but her husband is not at bedside. Patient stated she has been suffering with anxiety disorder and has been taking Xanax as needed even though she was prescribed Xanax 1 mg 4 times daily by Dr. Toy Care. Patient is also endorses feeling depressed about her husband being physically sick and his blood pressures frequently has a syncopal episodes. Patient stated she had a UTI, which making her go to bathroom more frequently and then she fell down while trying to reach the bathroom. Patient also reportedly stopped drinking alcohol about 3 weeks ago and taking alprazolam as needed for anxiety. Recent Skin evaluation indicated patient has no epileptiform seizures. Patient urine drug screen is positive for benzodiazepines.  Patient is known to this provider from her previous psychiatric consultation completed about 6 months ago for similar problems and benzodiazepines/alcohol withdrawal seizures and encephalopathy. Recommended taper off alprazolam and counsel to stay free from drinking alcohol and then placed in the skilled nursing facility. Patient is willing to participate in skilled nursing facility upon medically cleared. Patient denies current suicidal/homicidal ideation, intention or plans. Patient has no evidence of psychosis.  Past Psychiatric History: Patient has been diagnosed with a chronic anxiety and depression and denied acute psychiatric admission. She has been seeing Dr. Toy Care, is a private psychiatrist providing outpatient medication management for  long time.  Risk to Self: Is  patient at risk for suicide?: No Risk to Others:   Prior Inpatient Therapy:   Prior Outpatient Therapy:    Past Medical History:  Past Medical History:  Diagnosis Date  . Alopecia   . Anemia    hx of years ago   . Anxiety   . ARF (acute renal failure) ([place]) 08/23/2014  . Arthritis    psoriatic arthritis  . Back pain   . Blood transfusion    at age of 4  . Carotid stenosis 12/2016  . Chronic diastolic CHF (congestive heart failure) ([place])   . Depression   . Dysrhythmia 11/13/2016   PAFib for a short time- Echo done 11/14/16 PAF- to follow up with PCP- to see if she is a candiate for anticoags.  Not started at times time due to alcohol abuse.  [name] GERD (gastroesophageal reflux disease)   . H/O hiatal hernia   . History of acute cholangitis   . Hyperlipidemia   . Hypertension   . Hypothyroidism   . IBS (irritable bowel syndrome)   . Pancreatitis   . Peripheral vascular disease ([place])   . PONV (postoperative nausea and vomiting)    with Breast reduction- only time  . Rosacea   . Seizures ([place]) 11/13/2016   Thopught to be from withdrawl Benzodiazepine  . Stroke [facility])    patient denies-  . Thyroid disease     Past Surgical History:  Procedure Laterality Date  . ABDOMINAL HYSTERECTOMY     partial  . BACK SURGERY  2010   thoractic-screws and rods  . BILE DUCT STENT PLACEMENT  08/26/2014  . BREAST REDUCTION SURGERY    . CHOLECYSTECTOMY  1988   lap  . COLONOSCOPY    . COLONOSCOPY Left 09/08/2014   Procedure: COLONOSCOPY;  Surgeon: Arta Silence, MD;  Location[facility] ENDOSCOPY;  Service: Endoscopy;  Laterality: Left;  . ENDARTERECTOMY Left 12/28/2016  . ENDARTERECTOMY Left 12/28/2016   Procedure: ENDARTERECTOMY CAROTID LEFT WITH XENOSURE BIOLOGIC PATCH ANGIOPLASTY;  Surgeon: Angelia Mould, MD;  Location: [facility];  Service: Vascular;  Laterality: Left;  . ERCP    . ERCP N/A 08/26/2014   Procedure: ENDOSCOPIC RETROGRADE CHOLANGIOPANCREATOGRAPHY (ERCP);  Surgeon: Jeryl Columbia,  MD;  Location: [facility] ENDOSCOPY;  Service: Endoscopy;  Laterality: N/A;  . ERCP N/A 09/11/2014   Procedure: ENDOSCOPIC RETROGRADE CHOLANGIOPANCREATOGRAPHY (ERCP);  Surgeon: Arta Silence, MD;  Location: [facility] ENDOSCOPY;  Service: Endoscopy;  Laterality: N/A;  . EUS N/A 09/11/2014   Procedure: ESOPHAGEAL ENDOSCOPIC ULTRASOUND (EUS) RADIAL;  Surgeon: Arta Silence, MD;  Location: [facility] ENDOSCOPY;  Service: Endoscopy;  Laterality: N/A;  . EYE SURGERY Bilateral    Cataract  . JOINT REPLACEMENT  2005   left shoulder replacement   . ORIF CLAVICULAR FRACTURE Left 07/18/2014   Procedure: OPEN REDUCTION INTERNAL FIXATION (ORIF) LEFT CLAVICULAR FRACTURE;  Surgeon: Ninetta Lights, MD;  Location: [place];  Service: Orthopedics;  Laterality: Left;  . SPYGLASS CHOLANGIOSCOPY N/A 09/11/2014   Procedure: SPYGLASS CHOLANGIOSCOPY;  Surgeon: Arta Silence, MD;  Location: [facility] ENDOSCOPY;  Service: Endoscopy;  Laterality: N/A;  . TONSILLECTOMY  as child  . TOTAL KNEE ARTHROPLASTY Left 03/10/2016   Procedure: LEFT TOTAL KNEE ARTHROPLASTY;  Surgeon: Ninetta Lights, MD;  Location: [place];  Service: Orthopedics;  Laterality: Left;  [name] VENTRAL HERNIA REPAIR  06/08/2012   Procedure: LAPAROSCOPIC VENTRAL HERNIA;  Surgeon: Adin Hector, MD;  Location: [facility] ORS;  Service: General;  Laterality:  Right;   Family History:  Family History  Problem Relation Age of Onset  . Heart disease Mother   . Cancer Father        lung   Family Psychiatric  History: Patient has a brother with unknown mental illness and has a daughter who is a grownup with the depression and anxiety and also takes medications Xanax. Social History:  History  Alcohol Use No    Comment: rare     History  Drug Use  . Types: Benzodiazepines    Social History   Social History  . Marital status: Married    Spouse name: N/A  . Number of children: N/A  . Years of education: N/A   Social History Main Topics  . Smoking status: Never Smoker  .  Smokeless tobacco: Never Used  . Alcohol use No     Comment: rare  . Drug use: Yes    Types: Benzodiazepines  . Sexual activity: Not Currently   Other Topics Concern  . None   Social History Narrative  . None   Additional Social History:    Allergies:   Allergies  Allergen Reactions  . Tetanus Toxoids Swelling and Other (See Comments)    Arm (site) became swollen as a child  . Yellow Dyes (Non-Tartrazine) Itching and Other (See Comments)    Makes patient nervous     Labs:  Results for orders placed or performed during the hospital encounter of 06/26/17 (from the past 48 hour(s))  Glucose, capillary     Status: Abnormal   Collection Time: 06/28/17  5:03 PM  Result Value Ref Range   Glucose-Capillary 132 (H) 65 - 99 mg/dL  Blood gas, arterial     Status: Abnormal   Collection Time: 06/28/17  5:07 PM  Result Value Ref Range   FIO2 100.00    Delivery systems NONREBREATHER    pH, Arterial 7.206 (L) 7.350 - 7.450   pCO2 arterial 38.8 32.0 - 48.0 mmHg   pO2, Arterial 376 (H) 83.0 - 108.0 mmHg   Bicarbonate 14.8 (L) 20.0 - 28.0 mmol/L   Acid-base deficit 11.6 (H) 0.0 - 2.0 mmol/L   O2 Saturation 99.1 %   Patient temperature 98.6    Collection site LEFT RADIAL    Drawn by [id number]    Sample type ARTERIAL DRAW    Allens test (pass/fail) PASS PASS  Basic metabolic panel     Status: Abnormal   Collection Time: 06/28/17  6:45 PM  Result Value Ref Range   Sodium 141 135 - 145 mmol/L   Potassium 3.5 3.5 - 5.1 mmol/L   Chloride 107 101 - 111 mmol/L   CO2 25 22 - 32 mmol/L   Glucose, Bld 150 (H) 65 - 99 mg/dL   BUN 12 6 - 20 mg/dL   Creatinine, Ser 0.94 0.44 - 1.00 mg/dL   Calcium 8.6 (L) 8.9 - 10.3 mg/dL   GFR calc non Af Amer 57 (L) >60 mL/min   GFR calc Af Amer >60 >60 mL/min    Comment: (NOTE) The eGFR has been calculated using the CKD EPI equation. This calculation has not been validated in all clinical situations. eGFR's persistently <60 mL/min signify possible  Chronic Kidney Disease.    Anion gap 9 5 - 15  CBC     Status: Abnormal   Collection Time: 06/28/17  6:45 PM  Result Value Ref Range   WBC 12.0 (H) 4.0 - 10.5 K/uL   RBC 4.48 3.87 - 5.11 MIL/uL  Hemoglobin 12.4 12.0 - 15.0 g/dL   HCT 39.1 36.0 - 46.0 %   MCV 87.3 78.0 - 100.0 fL   MCH 27.7 26.0 - 34.0 pg   MCHC 31.7 30.0 - 36.0 g/dL   RDW 14.9 11.5 - 15.5 %   Platelets 196 150 - 400 K/uL  Lactic acid, plasma     Status: None   Collection Time: 06/28/17  6:46 PM  Result Value Ref Range   Lactic Acid, Venous 1.8 0.5 - 1.9 mmol/L  Lactic acid, plasma     Status: None   Collection Time: 06/29/17  5:52 AM  Result Value Ref Range   Lactic Acid, Venous 1.2 0.5 - 1.9 mmol/L  Vitamin B12     Status: None   Collection Time: 06/29/17 10:28 AM  Result Value Ref Range   Vitamin B-12 822 180 - 914 pg/mL    Comment: (NOTE) This assay is not validated for testing neonatal or myeloproliferative syndrome specimens for Vitamin B12 levels.   Folate     Status: None   Collection Time: 06/29/17 10:28 AM  Result Value Ref Range   Folate 15.8 >5.9 ng/mL  TSH     Status: None   Collection Time: 06/29/17 10:28 AM  Result Value Ref Range   TSH 1.146 0.350 - 4.500 uIU/mL    Comment: Performed by a 3rd Generation assay with a functional sensitivity of <=0.01 uIU/mL.    Current Facility-Administered Medications  Medication Dose Route Frequency Provider Last Rate Last Dose  . 0.9 %  sodium chloride infusion   Intravenous Continuous Osei-Bonsu, Iona Beard, MD 125 mL/hr at 06/30/17 0829 1,000 mL at 06/30/17 0829  . acetaminophen (TYLENOL) tablet 650 mg  650 mg Oral Q6H PRN Rondel Jumbo, PA-C   650 mg at 06/28/17 2038   Or  . acetaminophen (TYLENOL) suppository 650 mg  650 mg Rectal Q6H PRN Rondel Jumbo, PA-C      . ALPRAZolam Duanne Moron) tablet 1 mg  1 mg Oral TID PRN Benito Mccreedy, MD   1 mg at 06/29/17 2225  . amLODipine (NORVASC) tablet 5 mg  5 mg Oral Daily Rondel Jumbo, PA-C   5 mg at  06/30/17 1109  . aspirin EC tablet 81 mg  81 mg Oral Daily Rondel Jumbo, PA-C   81 mg at 06/30/17 1107  . atenolol (TENORMIN) tablet 50 mg  50 mg Oral Daily Rondel Jumbo, PA-C   50 mg at 06/30/17 1108  . bisacodyl (DULCOLAX) suppository 10 mg  10 mg Rectal Daily PRN Sharene Butters E, PA-C      . cefTRIAXone (ROCEPHIN) 1 g in dextrose 5 % 50 mL IVPB  1 g Intravenous Q24H Tyrone Apple, [facility]   Stopped at 06/29/17 1537  . feeding supplement (ENSURE ENLIVE) (ENSURE ENLIVE) liquid 237 mL  237 mL Oral BID BM Osei-Bonsu, George, MD   237 mL at 06/30/17 1110  . furosemide (LASIX) tablet 20 mg  20 mg Oral Q breakfast Rondel Jumbo, PA-C   20 mg at 06/30/17 6948  . hydrALAZINE (APRESOLINE) injection 5-10 mg  5-10 mg Intravenous Q8H PRN Rondel Jumbo, PA-C      . levothyroxine (SYNTHROID, LEVOTHROID) tablet 75 mcg  75 mcg Oral QAC breakfast Rondel Jumbo, PA-C   75 mcg at 06/30/17 5462  . ondansetron (ZOFRAN) tablet 4 mg  4 mg Oral Q6H PRN Rondel Jumbo, PA-C   4 mg at 06/28/17 7035   Or  .  ondansetron (ZOFRAN) injection 4 mg  4 mg Intravenous Q6H PRN Rondel Jumbo, PA-C   4 mg at 06/29/17 1507  . pantoprazole (PROTONIX) EC tablet 40 mg  40 mg Oral Daily Rondel Jumbo, PA-C   40 mg at 06/30/17 1109  . potassium chloride SA (K-DUR,KLOR-CON) CR tablet 40 mEq  40 mEq Oral Daily Rondel Jumbo, PA-C   40 mEq at 06/30/17 1110  . QUEtiapine (SEROQUEL) tablet 25 mg  25 mg Oral QHS Rondel Jumbo, PA-C   25 mg at 06/29/17 2219  . simvastatin (ZOCOR) tablet 10 mg  10 mg Oral QHS Rondel Jumbo, PA-C   10 mg at 06/29/17 2219  . thiamine (VITAMIN B-1) tablet 100 mg  100 mg Oral Daily Rai, Ripudeep K, MD   100 mg at 06/30/17 1107  . venlafaxine XR (EFFEXOR-XR) 24 hr capsule 150 mg  150 mg Oral Q breakfast Rondel Jumbo, PA-C   150 mg at 06/30/17 0814  . zolpidem (AMBIEN) tablet 5 mg  5 mg Oral QHS PRN Rise Patience, MD   5 mg at 06/28/17 0117    Musculoskeletal: Strength & Muscle Tone:  decreased Gait & Station: unable to stand Patient leans: N/A  Psychiatric Specialty Exam: Physical Exam as per history and physical   ROS patient has multiple bruises on her face and hand secondary to recent fall and also has hand tremors. Patient denies nausea, vomiting, abdominal pain, shortness of breath and chest pain.  No Fever-chills, No Headache, No changes with Vision or hearing, reports vertigo No problems swallowing food or Liquids, No Chest pain, Cough or Shortness of Breath, No Abdominal pain, No Nausea or Vommitting, Bowel movements are regular, No Blood in stool or Urine, No dysuria, No new skin rashes or bruises, No new joints pains-aches,  No new weakness, tingling, numbness in any extremity, No recent weight gain or loss, No polyuria, polydypsia or polyphagia,  A full 10 point Review of Systems was done, except as stated above, all other Review of Systems were negative.  Blood pressure (!) 150/65, pulse 66, temperature 98.1 F (36.7 C), temperature source Oral, resp. rate 18, height _0  (1.549 m), weight 75.2 kg (165 lb 12.6 oz), SpO2 98 %.Body mass index is 31.32 kg/m.  General Appearance: Casual  Eye Contact:  Good  Speech:  Clear and Coherent  Volume:  Decreased  Mood:  Anxious  Affect:  Appropriate and Congruent  Thought Process:  Coherent and Goal Directed  Orientation:  Full (Time, Place, and Person)  Thought Content:  Logical  Suicidal Thoughts:  No  Homicidal Thoughts:  No  Memory:  Immediate;   Fair Recent;   Fair Remote;   Fair  Judgement:  Fair  Insight:  Fair  Psychomotor Activity:  Decreased  Concentration:  Concentration: Fair and Attention Span: Fair  Recall:  Good  Fund of Knowledge:  Good  Language:  Good  Akathisia:  Negative  Handed:  Right  AIMS (if indicated):     Assets:  Communication Skills Desire for Improvement Financial Resources/Insurance Housing Intimacy Leisure Time Resilience Social Support Transportation   ADL's:  Impaired  Cognition:  Impaired,  Mild  Sleep:        Treatment Plan Summary: 81 years old female with a chronic anxiety disorder, alcohol abuse presented status post fall. Patient reportedly taking her Xanax as needed and also drinks alcohol until 3 weeks ago. Patient reported compliant with her medication and has no suicidal or homicidal  ideation or evidence of psychosis.  Sedative abuse with withdrawal seizures  Recommendation: Patient has a fall risk Monitor for alcohol/benzodiazepine withdrawal symptoms Continue Effexor XR 150 mg daily with breakfast for depression and anxiety Continue Seroquel 25 mg at bedtime for insomnia, she has no QTC prolongation  Taper off Alprazolam 0.5 mg 3 times daily x 2 day and than BID x 2 days and Discontinue  Recommend to forward discharge summary with medication changes to her primary psychiatrist.  Sporadic use of alprazolam causing frequent withdrawal seizures. Patient refused clonazepam saying it does not make her feel good. May start Librium 10 mg TID to prevent withdrawal seizures and excessive sedation which may leads to fall in this elderly women   Disposition: Refer to unit CSW regarding skilled nursing home placement when medically stable Patient does not meet criteria for psychiatric inpatient admission. Supportive therapy provided about ongoing stressors.  Ambrose Finland, MD 06/30/2017 12:28 PM

## 2017-07-01 LAB — BASIC METABOLIC PANEL
Anion gap: 10 (ref 5–15)
BUN: 16 mg/dL (ref 6–20)
CALCIUM: 8.4 mg/dL — AB (ref 8.9–10.3)
CO2: 28 mmol/L (ref 22–32)
CREATININE: 0.78 mg/dL (ref 0.44–1.00)
Chloride: 104 mmol/L (ref 101–111)
GFR calc non Af Amer: >60 mL/min (ref >60)
Glucose, Bld: 111 mg/dL — ABNORMAL HIGH (ref 65–99)
Potassium: 3.1 mmol/L — ABNORMAL LOW (ref 3.5–5.1)
SODIUM: 142 mmol/L (ref 135–145)

## 2017-07-01 LAB — CULTURE, BLOOD (ROUTINE X 2)
CULTURE: NO GROWTH
Culture: NO GROWTH
SPECIAL REQUESTS: ADEQUATE
Special Requests: ADEQUATE

## 2017-07-01 LAB — CK: Total CK: 98 U/L (ref 38–234)

## 2017-07-01 MED ORDER — HYDRALAZINE HCL 20 MG/ML IJ SOLN
10.0000 mg | Freq: Four times a day (QID) | INTRAMUSCULAR | Status: DC | PRN
  Filled 2017-07-01: qty 1

## 2017-07-01 MED ORDER — AMLODIPINE BESYLATE 10 MG PO TABS
10.0000 mg | ORAL_TABLET | Freq: Every day | ORAL | Status: DC
  Administered 2017-07-01 – 2017-07-02 (×2): 10 mg via ORAL
  Filled 2017-07-01 (×2): qty 1

## 2017-07-01 MED ORDER — ALPRAZOLAM 0.5 MG PO TABS
0.5000 mg | ORAL_TABLET | Freq: Three times a day (TID) | ORAL | Status: DC
  Administered 2017-07-01 (×2): 0.5 mg via ORAL
  Filled 2017-07-01 (×2): qty 1

## 2017-07-01 NOTE — Progress Notes (Signed)
[facility]                                                                              Patient Demographics  [name] [name], is a 81 y.o. female, DOB - 1936-01-20, KVQ:[id number]  Admit date - 06/26/2017   Admitting Physician Elwin Mocha, MD  Outpatient Primary MD for the patient is [name], MD  Outpatient specialists:   LOS - 5  days   Medical records reviewed and are as summarized below:    Chief Complaint  Patient presents with  . Fall       Brief summary   Patient is a 81 year old female with history of seizures in 2017 felt most likely to be due to benzodiazepine withdrawal after an extensive neurological and psychiatric workup, she has history of anxiety. Patient presented to ED on 7/15 by EMS after unwitnessed fall. She was found on the floor by her husband, face down at 3 AM. He was unable to pick her up on his own, covered her with a blanket and eventually called the EMS at 10 AM. She was mildly confused on arrival, daughter noticed a mild right facial droop. EEG on 7/16 revealed mild diffuse slowing but no epileptiform discharges.  Assessment & Plan    Principal problem Acute encephalopathy and seizures - Likely due to benzodiazepine withdrawals - Patient has a history of prior benzodiazepine withdrawal seizures after an extensive neurological and psychiatric workup in 2017 - EEG on 7/16 revealed mild diffuse slowing but no epileptiform discharges. - Neurology was consulted, on 7/17 she had tremors that decreased after initiation of Xanax. Subsequently she had with extension and jerking of both arms and then became unresponsive - Continue thiamine supplementation, follow thiamine level - TSH 1.1, B12 822, folate 15.8 - Highly appreciate psychiatry assistance in weaning off the Xanax safely. On examination, still has some tremulousness, will continue Xanax 0.5 TID for 1 more day, then taper to 0.5 mg BID x 2 days, then off Discussed with  psychiatry, Dr. Tory Emerald today, recommended to continue Librium at current dose 10mg  tid to avoid any withdrawal seizures for next 10 days, then taper down to 5 mg TID x 2 weeks/longer, further taper should be done slowly by her PCP or psychiatrist outpatient or at skilled nursing facility.  - I explained the above plan to the patient's husband, [name] in detail who was very appreciative of the safe weaning off Xanax and having substitution of another benzodiazepine to avoid the withdrawal seizures. We also talked about the concern about possibility of relapse if patient goes back to Xanax after she is released from the skilled nursing facility to home. Patient's husband states that he will discuss with her outpatient psychiatrist, Dr Toy Care in detail as this is becoming a serious concern and safety issue.   Dehydration - Improved, saline lock IV fluids  Multiple falls - Likely due to benzodiazepines and deconditioning - PTOT evaluation recommended skilled nursing facility  Rhabdomyolysis - CK improved 98, IV fluids discontinued  Failure to thrive - PT evaluation recommended skilled nursing facility  Left orbital inferior lateral mass - Incidental finding on MRI of  the 06/26/2017 - Outpatient ophthalmology workup  Hypertension - Currently stable  Chronic diastolic CHF - No acute decompensation, currently stable, monitor I's and O's and daily weights -Currently U vitamin, IV fluid has been discontinued  Hyperglycemia - Monitor CBGs closely - Hemoglobin A1c 5.4 on 7/15  Code Status: Full CODE STATUS DVT Prophylaxis:  Family Communication: Discussed in detail with the patient, all imaging results, lab results explained to the patient and husband in detail   Disposition Plan: Likely DC to skilled nursing facility in a.m.  Time Spent in minutes  25 minutes  Procedures:  EEG  Consultants:   Neurology Psychiatry  Antimicrobials:   IV  Rocephin   Medications  Scheduled Meds: . ALPRAZolam  0.5 mg Oral TID  . amLODipine  10 mg Oral Daily  . aspirin EC  81 mg Oral Daily  . atenolol  50 mg Oral Daily  . chlordiazePOXIDE  10 mg Oral TID  . feeding supplement (ENSURE ENLIVE)  237 mL Oral BID BM  . furosemide  20 mg Oral Q breakfast  . levothyroxine  75 mcg Oral QAC breakfast  . pantoprazole  40 mg Oral Daily  . potassium chloride SA  40 mEq Oral Daily  . QUEtiapine  25 mg Oral QHS  . simvastatin  10 mg Oral QHS  . thiamine  100 mg Oral Daily  . venlafaxine XR  150 mg Oral Q breakfast   Continuous Infusions: . cefTRIAXone (ROCEPHIN)  IV Stopped (06/30/17 1520)   PRN Meds:.acetaminophen **OR** acetaminophen, bisacodyl, hydrALAZINE, ondansetron **OR** ondansetron (ZOFRAN) IV, zolpidem   Antibiotics   Anti-infectives    Start     Dose/Rate Route Frequency Ordered Stop   06/27/17 1400  cefTRIAXone (ROCEPHIN) 1 g in dextrose 5 % 50 mL IVPB     1 g 100 mL/hr over 30 Minutes Intravenous Every 24 hours 06/26/17 1444     06/26/17 1330  cefTRIAXone (ROCEPHIN) 1 g in dextrose 5 % 50 mL IVPB     1 g 100 mL/hr over 30 Minutes Intravenous  Once 06/26/17 1317 06/26/17 1433        Subjective:   [name] [name] was seen and examined today. Feeling better, no withdrawal seizures, tolerating Librium without much sedation. Still some tremulousness noted. Denies dizziness, chest pain, shortness of breath, abdominal pain, N/V/D/C, new weakness, numbess, tingling  Objective:   Vitals:   07/01/17 0508 07/01/17 0542 07/01/17 0625 07/01/17 1023  BP: (!) 187/88 (!) 203/70 (!) 157/70 (!) 150/60  Pulse: 73 63 69 78  Resp: 20   20  Temp: 97.9 F (36.6 C)   98.7 F (37.1 C)  TempSrc: Oral   Oral  SpO2: 95%   98%  Weight:      Height:        Intake/Output Summary (Last 24 hours) at 07/01/17 1305 Last data filed at 06/30/17 1808  Gross per 24 hour  Intake                0 ml  Output              650 ml  Net             -650  ml     Wt Readings from Last 3 Encounters:  07/01/17 74.2 kg (163 lb 9.3 oz)  03/15/17 63 kg (138 lb 14.4 oz)  02/09/17 66.2 kg (146 lb)     Exam  General: Alert and oriented x 3, NAD  Eyes:   HEENT:  Cardiovascular: S1 S2 auscultated, no rubs, murmurs or gallops. Regular rate and rhythm. No pedal edema b/l  Respiratory: Clear to auscultation bilaterally, no wheezing, rales or rhonchi  Gastrointestinal: Soft, nontender, nondistended, + bowel sounds  Ext: no pedal edema bilaterally  Neuro: no new deficits, was still somewhat tremulous  Musculoskeletal: No digital cyanosis, clubbing  Skin: No rashes  Psych: alert and oriented x3    Data Reviewed:  I have personally reviewed following labs and imaging studies  Micro Results Recent Results (from the past 240 hour(s))  Urine culture     Status: None   Collection Time: 06/26/17  1:20 PM  Result Value Ref Range Status   Specimen Description URINE, CATHETERIZED  Final   Special Requests NONE  Final   Culture NO GROWTH  Final   Report Status 06/27/2017 FINAL  Final  Culture, blood (Routine X 2) w Reflex to ID Panel     Status: None   Collection Time: 06/26/17  4:46 PM  Result Value Ref Range Status   Specimen Description BLOOD LEFT HAND  Final   Special Requests IN PEDIATRIC BOTTLE Blood Culture adequate volume  Final   Culture NO GROWTH 5 DAYS  Final   Report Status 07/01/2017 FINAL  Final  Culture, blood (Routine X 2) w Reflex to ID Panel     Status: None   Collection Time: 06/26/17  5:41 PM  Result Value Ref Range Status   Specimen Description BLOOD RIGHT ANTECUBITAL  Final   Special Requests IN PEDIATRIC BOTTLE Blood Culture adequate volume  Final   Culture NO GROWTH 5 DAYS  Final   Report Status 07/01/2017 FINAL  Final    Radiology Reports Dg Chest 2 View  Result Date: 06/26/2017 CLINICAL DATA:  Fall, altered mental status EXAM: CHEST  2 VIEW COMPARISON:  03/11/2017 FINDINGS: Mild cardiomegaly without  CHF, focal pneumonia, collapse, or consolidation. No effusion or pneumothorax. Trachea is midline. Postop changes of the thoracolumbar spine, left clavicle and left shoulder. Monitor leads overlie the chest. Remote cholecystectomy noted. IMPRESSION: Cardiomegaly without acute chest process Electronically Signed   By: Jerilynn Mages.  Shick M.D.   On: 06/26/2017 12:22   Ct Head Wo Contrast  Result Date: 06/26/2017 CLINICAL DATA:  Slurred speech. Possible right facial droop. Found on the floor this morning at 3 a.m. EXAM: CT HEAD WITHOUT CONTRAST CT CERVICAL SPINE WITHOUT CONTRAST TECHNIQUE: Multidetector CT imaging of the head and cervical spine was performed following the standard protocol without intravenous contrast. Multiplanar CT image reconstructions of the cervical spine were also generated. COMPARISON:  Brain MR dated 11/11/2016. Head CT dated 11/10/2016 and cervical spine CT dated 09/06/2016. FINDINGS: CT HEAD FINDINGS Brain: Diffusely enlarged ventricles and subarachnoid spaces. Patchy white matter low density in both cerebral hemispheres. No intracranial hemorrhage, mass lesion or CT evidence of acute infarction. Vascular: No hyperdense vessel or unexpected calcification. Skull: Normal. Negative for fracture or focal lesion. Sinuses/Orbits: Small amount of fluid in the left maxillary sinus. Unremarkable orbits. Other: None. CT CERVICAL SPINE FINDINGS Alignment: Normal. Skull base and vertebrae: No acute fracture. No primary bone lesion or focal pathologic process. Soft tissues and spinal canal: No prevertebral fluid or swelling. No visible canal hematoma. Disc levels: Stable disc space narrowing and mild anterior and minimal posterior spur formation at the C5-6 and C6-7 levels. Facet degenerative changes at multiple levels. Upper chest: Stable mild patchy interstitial prominence at both lung apices. Other: Post carotid endarterectomy changes on the left and mild right carotid artery calcifications.  IMPRESSION: 1.  No skull fracture or intracranial hemorrhage. 2. No cervical spine fracture or subluxation. 3. Stable mild diffuse cerebral and cerebellar atrophy and mild chronic small vessel white matter ischemic changes in both cerebral hemispheres. 4. Stable cervical spine degenerative changes. 5. Mild right carotid artery atheromatous calcifications. 6. Stable mild chronic interstitial lung disease. Electronically Signed   By: Claudie Revering M.D.   On: 06/26/2017 12:15   Ct Cervical Spine Wo Contrast  Result Date: 06/26/2017 CLINICAL DATA:  Slurred speech. Possible right facial droop. Found on the floor this morning at 3 a.m. EXAM: CT HEAD WITHOUT CONTRAST CT CERVICAL SPINE WITHOUT CONTRAST TECHNIQUE: Multidetector CT imaging of the head and cervical spine was performed following the standard protocol without intravenous contrast. Multiplanar CT image reconstructions of the cervical spine were also generated. COMPARISON:  Brain MR dated 11/11/2016. Head CT dated 11/10/2016 and cervical spine CT dated 09/06/2016. FINDINGS: CT HEAD FINDINGS Brain: Diffusely enlarged ventricles and subarachnoid spaces. Patchy white matter low density in both cerebral hemispheres. No intracranial hemorrhage, mass lesion or CT evidence of acute infarction. Vascular: No hyperdense vessel or unexpected calcification. Skull: Normal. Negative for fracture or focal lesion. Sinuses/Orbits: Small amount of fluid in the left maxillary sinus. Unremarkable orbits. Other: None. CT CERVICAL SPINE FINDINGS Alignment: Normal. Skull base and vertebrae: No acute fracture. No primary bone lesion or focal pathologic process. Soft tissues and spinal canal: No prevertebral fluid or swelling. No visible canal hematoma. Disc levels: Stable disc space narrowing and mild anterior and minimal posterior spur formation at the C5-6 and C6-7 levels. Facet degenerative changes at multiple levels. Upper chest: Stable mild patchy interstitial prominence at both lung apices.  Other: Post carotid endarterectomy changes on the left and mild right carotid artery calcifications. IMPRESSION: 1. No skull fracture or intracranial hemorrhage. 2. No cervical spine fracture or subluxation. 3. Stable mild diffuse cerebral and cerebellar atrophy and mild chronic small vessel white matter ischemic changes in both cerebral hemispheres. 4. Stable cervical spine degenerative changes. 5. Mild right carotid artery atheromatous calcifications. 6. Stable mild chronic interstitial lung disease. Electronically Signed   By: Claudie Revering M.D.   On: 06/26/2017 12:15   Mr Brain Wo Contrast  Result Date: 06/26/2017 CLINICAL DATA:  Unwitnessed fall. Patient found down. Query seizure, infection, versus overdose. EXAM: MRI HEAD WITHOUT CONTRAST TECHNIQUE: Multiplanar, multiecho pulse sequences of the brain and surrounding structures were obtained without intravenous contrast. COMPARISON:  MR brain 11/11/2016.  CT head 06/26/2017. FINDINGS: Brain: No evidence for acute infarction, hemorrhage, mass lesion, hydrocephalus, or extra-axial fluid. Generalized atrophy. Chronic microvascular ischemic change. Vascular: Normal flow voids. Skull and upper cervical spine: Normal marrow signal. Sinuses/Orbits: Negative sinuses. In the inferolateral orbit on the LEFT there is an 11 x 10 x 11 mm rounded mass, which may AP extraconal or may be intimately associated with an extra ocular muscle (inferior oblique close). On CT the lesion is hyperdense. On MR there is no significant restriction. No associated sinus or osseous pathology. Lacrimal glands appear normal. Compared with the prior CT and MR studies, the lesion has grown since 2017. Other: None. IMPRESSION: Atrophy and small vessel disease similar to priors. No acute intracranial findings. LEFT orbital inferolateral mass. This is either extraconal or associated with the muscle. This represents an unusual lesion, and the differential is wide. Considerations would include  unusual thyroid orbitopathy, lymphoma, orbital pseudotumor, metastasis, sarcoidosis, neurofibroma, or meningioma. Recommend ophthalmologic evaluation. It is possible the lesion could be better characterized with sonography.  Electronically Signed   By: Staci Righter M.D.   On: 06/26/2017 15:51    Lab Data:  CBC:  Recent Labs Lab 06/26/17 1143 06/26/17 1204 06/27/17 0325 06/28/17 1845  WBC 15.1*  --  9.6 12.0*  NEUTROABS 13.7*  --   --   --   HGB 12.9 13.3 11.3* 12.4  HCT 39.5 39.0 35.7* 39.1  MCV 85.9  --  86.9 87.3  PLT 226  --  189 034   Basic Metabolic Panel:  Recent Labs Lab 06/26/17 1143 06/26/17 1204 06/26/17 1701 06/27/17 0325 06/28/17 0620 06/28/17 1845 07/01/17 0410  NA 142 144  --  141 139 141 142  K 3.9 3.8  --  3.5 3.4* 3.5 3.1*  CL 111 110  --  112* 110 107 104  CO2 19*  --   --  20* 22 25 28   GLUCOSE 120* 121*  --  106* 108* 150* 111*  BUN 14 16  --  10 8 12 16   CREATININE 1.13* 1.00  --  0.93 0.78 0.94 0.78  CALCIUM 9.2  --   --  8.4* 8.1* 8.6* 8.4*  MG  --   --  1.8  --   --   --   --   PHOS  --   --  4.0  --   --   --   --    GFR: Estimated Creatinine Clearance: 53.4 mL/min (by C-G formula based on SCr of 0.78 mg/dL). Liver Function Tests:  Recent Labs Lab 06/26/17 1143 06/27/17 0325  AST 68* 60*  ALT 36 36  ALKPHOS 96 78  BILITOT 1.2 0.8  PROT 6.5 5.7*  ALBUMIN 3.4* 3.0*   No results for input(s): LIPASE, AMYLASE in the last 168 hours. No results for input(s): AMMONIA in the last 168 hours. Coagulation Profile:  Recent Labs Lab 06/27/17 0325  INR 1.01   Cardiac Enzymes:  Recent Labs Lab 06/26/17 1143 06/27/17 0325 06/28/17 0620 07/01/17 0410  CKTOTAL 2,013* 1,877* 725* 98   BNP (last 3 results) No results for input(s): PROBNP in the last 8760 hours. HbA1C: No results for input(s): HGBA1C in the last 72 hours. CBG:  Recent Labs Lab 06/27/17 0612 06/28/17 1703  GLUCAP 214* 132*   Lipid Profile: No results for  input(s): CHOL, HDL, LDLCALC, TRIG, CHOLHDL, LDLDIRECT in the last 72 hours. Thyroid Function Tests:  Recent Labs  06/29/17 1028  TSH 1.146   Anemia Panel:  Recent Labs  06/29/17 1028  VITAMINB12 822  FOLATE 15.8   Urine analysis:    Component Value Date/Time   COLORURINE YELLOW 06/26/2017 [place] 06/26/2017 1322   LABSPEC 1.014 06/26/2017 1322   PHURINE 5.0 06/26/2017 1322   GLUCOSEU NEGATIVE 06/26/2017 1322   HGBUR MODERATE (A) 06/26/2017 1322   BILIRUBINUR NEGATIVE 06/26/2017 1322   KETONESUR 5 (A) 06/26/2017 1322   PROTEINUR 100 (A) 06/26/2017 1322   UROBILINOGEN 0.2 09/04/2014 1340   NITRITE NEGATIVE 06/26/2017 1322   LEUKOCYTESUR NEGATIVE 06/26/2017 1322     Ripudeep Rai M.D. [facility] 07/01/2017, 1:05 PM  Pager: [phone] Between 7am to 7pm - call Pager - [phone]  After 7pm go to [email or web address] - password [email or web address]  Call night coverage person covering after 7pm

## 2017-07-01 NOTE — Consult Note (Signed)
[place] [facility] Consult   Reason for Consult:  Anxiety disorder and alcohol and xanax abuse Referring Physician:  Dr. 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 Fall [W19.XXXA] 06/26/2017  . Intractable nausea and vomiting [R11.2] 03/11/2017  . Acute lower UTI [N39.0] 03/11/2017  . Carotid stenosis [I65.29] 12/28/2016  . Sedative, hypnotic, or anxiolytic withdrawal delirium ([place]) [F13.931] 11/15/2016  . Paroxysmal atrial fibrillation (HCC) [I48.0]   . Cerebrovascular disease [I67.9]   . Alcohol withdrawal delirium ([place]) [F10.231]   . Fever [R50.9]   . Acute encephalopathy [G93.40] 11/10/2016  . Hypothyroidism [E03.9] 11/10/2016  . Hypertensive urgency [I16.0] 11/10/2016  . Altered mental state [R41.82] 11/10/2016  . Seizure ([place]) [R56.9] 11/10/2016  . Chronic diastolic CHF (congestive heart failure) ([place]) [I50.32]   . Left carotid stenosis [I65.22] 10/29/2016  . S/P total knee replacement [Z96.659] 03/10/2016  . 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 Ileus ([place]) [K56.7] 09/05/2014  . Leukocytosis [D72.829] 09/05/2014  . Vomiting [R11.10] 09/04/2014  . Pulmonary edema [J81.1] 09/04/2014  . Hypokalemia [E87.6] 08/28/2014  . Enteritis due to Clostridium difficile [A04.72] 08/27/2014  . Common bile duct (CBD) stricture [K83.1] 08/27/2014  . Protein-calorie malnutrition, severe ([place]) [E43] 08/24/2014  . Obstructive jaundice [K83.8] 08/23/2014  . Acute cholangitis [K83.0] 08/23/2014  . ARF (acute renal failure) ([place]) [N17.9] 08/23/2014  . Coagulopathy ([place]) [D68.9] 08/23/2014  . Back pain [M54.9]   . Arthritis [M19.90]   . Hypertension [I10]   . GERD (gastroesophageal reflux disease) [K21.9]   . Anxiety  [F41.9]   . IBS (irritable bowel syndrome) [K58.9]   . Hernia of Right posterior flank [K45.8] 04/18/2012    Total Time spent with patient: 45 minutes  Subjective:   [name] [name] is a 81 y.o. female patient admitted with depression, alcohol and xanax abuse.  HPI: [name] [name] is a 81 years old female presented with unwitnessed fall on admission. Patient seen, chart reviewed and case discussed with the Dr. Tana Coast for this face-to-face psychiatric consultation and evaluation. Patient lives with her husband but her husband is not at bedside. Patient stated she has been suffering with anxiety disorder and has been taking Xanax as needed even though she was prescribed Xanax 1 mg 4 times daily by Dr. Toy Care. Patient is also endorses feeling depressed about her husband being physically sick and his blood pressures frequently has a syncopal episodes. Patient stated she had a UTI, which making her go to bathroom more frequently and then she fell down while trying to reach the bathroom. Patient also reportedly stopped drinking alcohol about 3 weeks ago and taking alprazolam as needed for anxiety. Recent Skin evaluation indicated patient has no epileptiform seizures. Patient urine drug screen is positive for benzodiazepines.  Patient is known to this provider from her previous psychiatric consultation completed about 6 months ago for similar problems and benzodiazepines/alcohol withdrawal seizures and encephalopathy. Recommended taper off alprazolam and counsel to stay free from drinking alcohol and then placed in the skilled nursing facility. Patient is willing to participate in skilled nursing facility upon medically cleared. Patient denies current suicidal/homicidal ideation, intention or plans. Patient has no evidence of psychosis.  Past Psychiatric History: Patient has been diagnosed with a chronic anxiety and depression and denied acute psychiatric admission. She has  been seeing Dr. Toy Care, is a private  psychiatrist providing outpatient medication management for long time.  07/01/2017 Interval History: Patient seen for psychiatric consultation follow-up today. Patient continued to endorse symptoms of anxiety and depression. Patient is compliant with her medication and reportedly happy that she is getting weaned off from alprazolam and able to tolerate Librium to prevent withdrawal seizures. Patient is willing to participate in brief skilled nursing home placement once medically stable. Patient has no suicidal/homicidal ideation or no evidence of psychosis.    Risk to Self: Is patient at risk for suicide?: No Risk to Others:   Prior Inpatient Therapy:   Prior Outpatient Therapy:    Past Medical History:  Past Medical History:  Diagnosis Date  . Alopecia   . Anemia    hx of years ago   . Anxiety   . ARF (acute renal failure) ([place]) 08/23/2014  . Arthritis    psoriatic arthritis  . Back pain   . Blood transfusion    at age of 4  . Carotid stenosis 12/2016  . Chronic diastolic CHF (congestive heart failure) ([place])   . Depression   . Dysrhythmia 11/13/2016   PAFib for a short time- Echo done 11/14/16 PAF- to follow up with PCP- to see if she is a candiate for anticoags.  Not started at times time due to alcohol abuse.  [name] GERD (gastroesophageal reflux disease)   . H/O hiatal hernia   . History of acute cholangitis   . Hyperlipidemia   . Hypertension   . Hypothyroidism   . IBS (irritable bowel syndrome)   . Pancreatitis   . Peripheral vascular disease ([place])   . PONV (postoperative nausea and vomiting)    with Breast reduction- only time  . Rosacea   . Seizures ([place]) 11/13/2016   Thopught to be from withdrawl Benzodiazepine  . Stroke [facility])    patient denies-  . Thyroid disease     Past Surgical History:  Procedure Laterality Date  . ABDOMINAL HYSTERECTOMY     partial  . BACK SURGERY  2010   thoractic-screws and rods  . BILE DUCT STENT PLACEMENT  08/26/2014  . BREAST  REDUCTION SURGERY    . CHOLECYSTECTOMY  1988   lap  . COLONOSCOPY    . COLONOSCOPY Left 09/08/2014   Procedure: COLONOSCOPY;  Surgeon: Arta Silence, MD;  Location[facility] ENDOSCOPY;  Service: Endoscopy;  Laterality: Left;  . ENDARTERECTOMY Left 12/28/2016  . ENDARTERECTOMY Left 12/28/2016   Procedure: ENDARTERECTOMY CAROTID LEFT WITH XENOSURE BIOLOGIC PATCH ANGIOPLASTY;  Surgeon: Angelia Mould, MD;  Location: [place];  Service: Vascular;  Laterality: Left;  . ERCP    . ERCP N/A 08/26/2014   Procedure: ENDOSCOPIC RETROGRADE CHOLANGIOPANCREATOGRAPHY (ERCP);  Surgeon: Jeryl Columbia, MD;  Location[facility] ENDOSCOPY;  Service: Endoscopy;  Laterality: N/A;  . ERCP N/A 09/11/2014   Procedure: ENDOSCOPIC RETROGRADE CHOLANGIOPANCREATOGRAPHY (ERCP);  Surgeon: Arta Silence, MD;  Location: [facility] ENDOSCOPY;  Service: Endoscopy;  Laterality: N/A;  . EUS N/A 09/11/2014   Procedure: ESOPHAGEAL ENDOSCOPIC ULTRASOUND (EUS) RADIAL;  Surgeon: Arta Silence, MD;  Location: [facility] ENDOSCOPY;  Service: Endoscopy;  Laterality: N/A;  . EYE SURGERY Bilateral    Cataract  . JOINT REPLACEMENT  2005   left shoulder replacement   . ORIF CLAVICULAR FRACTURE Left 07/18/2014   Procedure: OPEN REDUCTION INTERNAL FIXATION (ORIF) LEFT CLAVICULAR FRACTURE;  Surgeon: Ninetta Lights, MD;  Location: [place];  Service: Orthopedics;  Laterality: Left;  . SPYGLASS CHOLANGIOSCOPY N/A  09/11/2014   Procedure: EAVWUJWJ CHOLANGIOSCOPY;  Surgeon: Arta Silence, MD;  Location: [facility] ENDOSCOPY;  Service: Endoscopy;  Laterality: N/A;  . TONSILLECTOMY  as child  . TOTAL KNEE ARTHROPLASTY Left 03/10/2016   Procedure: LEFT TOTAL KNEE ARTHROPLASTY;  Surgeon: Ninetta Lights, MD;  Location: [place];  Service: Orthopedics;  Laterality: Left;  [name] VENTRAL HERNIA REPAIR  06/08/2012   Procedure: LAPAROSCOPIC VENTRAL HERNIA;  Surgeon: Adin Hector, MD;  Location: [facility] ORS;  Service: General;  Laterality: Right;   Family History:  Family History   Problem Relation Age of Onset  . Heart disease Mother   . Cancer Father        lung   Family Psychiatric  History: Patient has a brother with unknown mental illness and has a daughter who is a grownup with the depression and anxiety and also takes medications Xanax. Social History:  History  Alcohol Use No    Comment: rare     History  Drug Use  . Types: Benzodiazepines    Social History   Social History  . Marital status: Married    Spouse name: N/A  . Number of children: N/A  . Years of education: N/A   Social History Main Topics  . Smoking status: Never Smoker  . Smokeless tobacco: Never Used  . Alcohol use No     Comment: rare  . Drug use: Yes    Types: Benzodiazepines  . Sexual activity: Not Currently   Other Topics Concern  . None   Social History Narrative  . None   Additional Social History:    Allergies:   Allergies  Allergen Reactions  . Tetanus Toxoids Swelling and Other (See Comments)    Arm (site) became swollen as a child  . Yellow Dyes (Non-Tartrazine) Itching and Other (See Comments)    Makes patient nervous     Labs:  Results for orders placed or performed during the hospital encounter of 06/26/17 (from the past 48 hour(s))  CK     Status: None   Collection Time: 07/01/17  4:10 AM  Result Value Ref Range   Total CK 98 38 - 234 U/L  Basic metabolic panel     Status: Abnormal   Collection Time: 07/01/17  4:10 AM  Result Value Ref Range   Sodium 142 135 - 145 mmol/L   Potassium 3.1 (L) 3.5 - 5.1 mmol/L   Chloride 104 101 - 111 mmol/L   CO2 28 22 - 32 mmol/L   Glucose, Bld 111 (H) 65 - 99 mg/dL   BUN 16 6 - 20 mg/dL   Creatinine, Ser 0.78 0.44 - 1.00 mg/dL   Calcium 8.4 (L) 8.9 - 10.3 mg/dL   GFR calc non Af Amer >60 >60 mL/min   GFR calc Af Amer >60 >60 mL/min    Comment: (NOTE) The eGFR has been calculated using the CKD EPI equation. This calculation has not been validated in all clinical situations. eGFR's persistently <60  mL/min signify possible Chronic Kidney Disease.    Anion gap 10 5 - 15    Current Facility-Administered Medications  Medication Dose Route Frequency Provider Last Rate Last Dose  . acetaminophen (TYLENOL) tablet 650 mg  650 mg Oral Q6H PRN Rondel Jumbo, PA-C   650 mg at 07/01/17 0559   Or  . acetaminophen (TYLENOL) suppository 650 mg  650 mg Rectal Q6H PRN Rondel Jumbo, PA-C      . ALPRAZolam (XANAX) tablet 0.5 mg  0.5 mg  Oral TID Rai, Ripudeep K, MD      . amLODipine (NORVASC) tablet 10 mg  10 mg Oral Daily Rai, Ripudeep K, MD   10 mg at 07/01/17 1038  . aspirin EC tablet 81 mg  81 mg Oral Daily Rondel Jumbo, PA-C   81 mg at 07/01/17 1038  . atenolol (TENORMIN) tablet 50 mg  50 mg Oral Daily Rondel Jumbo, PA-C   50 mg at 07/01/17 1038  . bisacodyl (DULCOLAX) suppository 10 mg  10 mg Rectal Daily PRN Sharene Butters E, PA-C      . cefTRIAXone (ROCEPHIN) 1 g in dextrose 5 % 50 mL IVPB  1 g Intravenous Q24H Tyrone Apple, [facility]   Stopped at 06/30/17 1520  . chlordiazePOXIDE (LIBRIUM) capsule 10 mg  10 mg Oral TID Rai, Ripudeep K, MD   10 mg at 07/01/17 1037  . feeding supplement (ENSURE ENLIVE) (ENSURE ENLIVE) liquid 237 mL  237 mL Oral BID BM Osei-Bonsu, Iona Beard, MD   237 mL at 07/01/17 1041  . furosemide (LASIX) tablet 20 mg  20 mg Oral Q breakfast Rondel Jumbo, PA-C   20 mg at 07/01/17 1037  . hydrALAZINE (APRESOLINE) injection 10 mg  10 mg Intravenous Q6H PRN Rai, Ripudeep K, MD      . levothyroxine (SYNTHROID, LEVOTHROID) tablet 75 mcg  75 mcg Oral QAC breakfast Rondel Jumbo, PA-C   75 mcg at 07/01/17 0547  . ondansetron (ZOFRAN) tablet 4 mg  4 mg Oral Q6H PRN Rondel Jumbo, PA-C   4 mg at 06/28/17 3785   Or  . ondansetron (ZOFRAN) injection 4 mg  4 mg Intravenous Q6H PRN Sharene Butters E, PA-C   4 mg at 06/29/17 1507  . pantoprazole (PROTONIX) EC tablet 40 mg  40 mg Oral Daily Rondel Jumbo, PA-C   40 mg at 07/01/17 1038  . potassium chloride SA (K-DUR,KLOR-CON) CR tablet  40 mEq  40 mEq Oral Daily Rondel Jumbo, PA-C   40 mEq at 07/01/17 1038  . QUEtiapine (SEROQUEL) tablet 25 mg  25 mg Oral QHS Rondel Jumbo, PA-C   25 mg at 07/01/17 0006  . simvastatin (ZOCOR) tablet 10 mg  10 mg Oral QHS Rondel Jumbo, PA-C   10 mg at 07/01/17 0007  . thiamine (VITAMIN B-1) tablet 100 mg  100 mg Oral Daily Rai, Ripudeep K, MD   100 mg at 07/01/17 1037  . venlafaxine XR (EFFEXOR-XR) 24 hr capsule 150 mg  150 mg Oral Q breakfast Rondel Jumbo, PA-C   150 mg at 07/01/17 1037  . zolpidem (AMBIEN) tablet 5 mg  5 mg Oral QHS PRN Rise Patience, MD   5 mg at 06/28/17 0117    Musculoskeletal: Strength & Muscle Tone: decreased Gait & Station: unable to stand Patient leans: N/A  Psychiatric Specialty Exam: Physical Exam as per history and physical   ROS patient has hand tremors. Patient denies nausea, vomiting, abdominal pain, shortness of breath and chest pain.   Blood pressure (!) 150/60, pulse 78, temperature 98.7 F (37.1 C), temperature source Oral, resp. rate 20, height _0  (1.549 m), weight 74.2 kg (163 lb 9.3 oz), SpO2 98 %.Body mass index is 30.91 kg/m.  General Appearance: Casual  Eye Contact:  Good  Speech:  Clear and Coherent  Volume:  Decreased  Mood:  Anxious  Affect:  Appropriate and Congruent  Thought Process:  Coherent and Goal Directed  Orientation:  Full (Time,  Place, and Person)  Thought Content:  Logical  Suicidal Thoughts:  No  Homicidal Thoughts:  No  Memory:  Immediate;   Fair Recent;   Fair Remote;   Fair  Judgement:  Fair  Insight:  Fair  Psychomotor Activity:  Decreased  Concentration:  Concentration: Fair and Attention Span: Fair  Recall:  Good  Fund of Knowledge:  Good  Language:  Good  Akathisia:  Negative  Handed:  Right  AIMS (if indicated):     Assets:  Communication Skills Desire for Improvement Financial Resources/Insurance Housing Intimacy Leisure Time Resilience Social Support Transportation  ADL's:   Impaired  Cognition:  Impaired,  Mild  Sleep:        Treatment Plan Summary: 82 years old female with a chronic anxiety disorder, alcohol abuse presented status post fall. Patient reportedly taking her Xanax as needed and also drinks alcohol until 3 weeks ago. Patient reported compliant with her medication and has no suicidal or homicidal ideation or evidence of psychosis.  Sedative abuse with withdrawal seizures  Recommendation: Patient has a fall risk Monitor for alcohol/benzodiazepine withdrawal symptoms Continue Effexor XR 150 mg daily with breakfast for depression and anxiety Continue Seroquel 25 mg at bedtime for insomnia, she has no QTC prolongation Continue planned taper off Alprazolam 0.5 mg 3 times daily x 2 day and than BID x 2 days and discontinue  Recommend to forward discharge summary with medication changes to her primary psychiatrist Dr. Toy Care.  Sporadic use of alprazolam causing frequent withdrawal seizures. Continue slow benzodiazepine detox treatment:  Librium 10 mg TID to prevent withdrawal seizures, excessive sedation and frequent falls in this elderly women for ten days and than reduce to Librium 5 mg TID x 10 days and than discontinue.   Disposition: Refer to unit CSW regarding skilled nursing home placement when medically stable Patient does not meet criteria for psychiatric inpatient admission. Supportive therapy provided about ongoing stressors.  Ambrose Finland, MD 07/01/2017 12:37 PM

## 2017-07-02 LAB — BASIC METABOLIC PANEL
Anion gap: 8 (ref 5–15)
BUN: 20 mg/dL (ref 6–20)
CHLORIDE: 101 mmol/L (ref 101–111)
CO2: 30 mmol/L (ref 22–32)
CREATININE: 0.81 mg/dL (ref 0.44–1.00)
Calcium: 8.7 mg/dL — ABNORMAL LOW (ref 8.9–10.3)
GFR calc Af Amer: >60 mL/min (ref >60)
GFR calc non Af Amer: >60 mL/min (ref >60)
GLUCOSE: 118 mg/dL — AB (ref 65–99)
Potassium: 3.2 mmol/L — ABNORMAL LOW (ref 3.5–5.1)
SODIUM: 139 mmol/L (ref 135–145)

## 2017-07-02 LAB — VITAMIN B1: Vitamin B1 (Thiamine): 99.1 nmol/L (ref 66.5–200.0)

## 2017-07-02 MED ORDER — ALPRAZOLAM 0.25 MG PO TABS: ORAL_TABLET | ORAL | 0 refills | Status: DC

## 2017-07-02 MED ORDER — CHLORDIAZEPOXIDE HCL 10 MG PO CAPS: 10.0000 mg | ORAL_CAPSULE | Freq: Three times a day (TID) | ORAL | 0 refills | Status: DC

## 2017-07-02 MED ORDER — AMLODIPINE BESYLATE 10 MG PO TABS: 10.0000 mg | ORAL_TABLET | Freq: Every day | ORAL | Status: DC

## 2017-07-02 MED ORDER — ALPRAZOLAM 0.5 MG PO TABS
0.5000 mg | ORAL_TABLET | Freq: Three times a day (TID) | ORAL | Status: AC
  Administered 2017-07-02: 0.5 mg via ORAL
  Filled 2017-07-02: qty 1

## 2017-07-02 MED ORDER — ZOLPIDEM TARTRATE 5 MG PO TABS: 5.0000 mg | ORAL_TABLET | Freq: Every evening | ORAL | 0 refills | Status: DC | PRN

## 2017-07-02 NOTE — Progress Notes (Signed)
Patient left unit by PTAR with all belongings, spouse at side.

## 2017-07-02 NOTE — Clinical Social Work Placement (Signed)
   CLINICAL SOCIAL WORK PLACEMENT  NOTE  Date:  07/02/2017  Patient Details  Name: [name] [name] MRN: [id number] Date of Birth: 20-May-1936  Clinical Social Work is seeking post-discharge placement for this patient at the [place] level of care (*CSW will initial, date and re-position this form in  chart as items are completed):  Yes   Patient/family provided with [facility] Work Department's list of facilities offering this level of care within the geographic area requested by the patient (or if unable, by the patient's family).  Yes   Patient/family informed of their freedom to choose among providers that offer the needed level of care, that participate in Medicare, Medicaid or managed care program needed by the patient, have an available bed and are willing to accept the patient.  Yes   Patient/family informed of [facility]'s ownership interest in [facility] and [facility], as well as of the fact that they are under no obligation to receive care at these facilities.  PASRR submitted to EDS on       PASRR number received on       Existing PASRR number confirmed on 07/02/17     FL2 transmitted to all facilities in geographic area requested by pt/family on 07/02/17     FL2 transmitted to all facilities within larger geographic area on       Patient informed that his/her managed care company has contracts with or will negotiate with certain facilities, including the following:        Yes   Patient/family informed of bed offers received.  Patient chooses bed at [place], [place]     Physician recommends and patient chooses bed at [place], [place]    Patient to be transferred to [place], [place] on  .  Patient to be transferred to facility by EMS     Patient family notified on 07/02/17 of transfer.  Name of family member notified:  Husband in room at time of [facility] Please sign FL2     Additional  Comment:    _______________________________________________ Lilly Cove, LCSW 07/02/2017, 10:02 AM

## 2017-07-02 NOTE — Progress Notes (Addendum)
LCSW following for discharge planning needs. Patient to discharge to SNF today to [place] has been contacted and aware of discharge.  Awaiting room number and report to call for RN. Patient and husband aware of discharge and in agreement to plan.  Husband feels more safe with patient riding in EMS as she is very shaky and has not walked while in the hospital.  LCSW will arrange EMS for transport.  Patient will be going to room 208 at facility.  Report:  [phone]  All clinicals sent through the [facility] for review.   No other needs, DC to SNF today.  Lane Hacker, MSW Clinical Social Work: [employer] Coverage for :  [phone]

## 2017-07-02 NOTE — Discharge Summary (Addendum)
Physician Discharge Summary   Patient ID: [name] [name] MRN: [id number] DOB/AGE: Feb 12, 1936 81 y.o.  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 Fall . Arthritis . Hypertension . GERD (gastroesophageal reflux disease) . Anxiety . IBS (irritable bowel syndrome) . Leukocytosis . Hypothyroidism . Chronic diastolic CHF (congestive heart failure) ([facility]) . Altered mental state . Paroxysmal atrial fibrillation (HCC) . 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 Please repeat CBC/BMET at next visit 2. Fall precautions, seizure precautions 3. Patient is currently on weaning protocol of the Xanax as following:  4. Continue Xanax 0.5 mg TID x 1 more day, then taper to 0.25mg  PO TID x 2 days , then off. However please reevaluate the patient closely, may continue the xanax taper to 0.25mg  BID slowly if needed if patient is still tremulous to avoid withdrawal seizures.  Continue Librium 10 mg TID for 10 more days, then wean down to 5 mg TID x 2 weeks. Then off or further evaluation by M.D. for slow taper as needed. Patient needs to be closely followed by SNF M.D. and referred to psychiatrist outpatient. She does not want to follow her outpatient psychiatrist anymore.   Left orbital inferior lateral mass - Incidental finding on MRI of the 06/26/2017 - Outpatient ophthalmology workup recommended   DIET: Heart healthy diet    Allergies:   Allergies  Allergen Reactions  . Tetanus Toxoids Swelling and Other (See Comments)    Arm (site) became swollen as a child  . Yellow  Dyes (Non-Tartrazine) Itching and Other (See Comments)    Makes patient nervous      DISCHARGE MEDICATIONS: Current Discharge Medication List    START taking these medications   Details  acetaminophen (TYLENOL) 325 MG tablet Take 2 tablets (650 mg total) by mouth every 6 (six) hours as needed for mild pain (or Fever >/= 101).    chlordiazePOXIDE (LIBRIUM) 10 MG capsule Take 1 capsule (10 mg total) by mouth 3 (three) times daily. For 10 days, then 5 mg PO 3 (three) times daily for 2 weeks, then off or further instructions per MD evaluation Qty: 30 capsule, Refills: 0    feeding supplement, ENSURE ENLIVE, (ENSURE ENLIVE) LIQD Take 237 mLs by mouth 2 (two) times daily between meals. Qty: 237 mL, Refills: 12      CONTINUE these medications which have CHANGED   Details  ALPRAZolam (XANAX) 0.25 MG tablet Take 2 tabs (0.5 mg) TID x 1 more day, then 1 tab TID PO for 2 more days, then OFF or per MD evaluation Qty: 10 tablet, Refills: 0    amLODipine (NORVASC) 10 MG tablet Take 1 tablet (10 mg total) by mouth daily.    zolpidem (AMBIEN) 5 MG tablet Take 1 tablet (5 mg total) by mouth at bedtime as needed for sleep. Qty: 20 tablet, Refills: 0      CONTINUE these medications which have NOT CHANGED   Details  aspirin EC 81 MG tablet Take 81 mg by mouth daily.    aspirin-acetaminophen-caffeine (EXCEDRIN MIGRAINE) 250-250-65 MG tablet Take 1-2 tablets by mouth every 6 (six) hours as  needed for headache.    atenolol (TENORMIN) 50 MG tablet Take 1 tablet (50 mg total) by mouth daily. Qty: 30 tablet, Refills: 0    esomeprazole (NEXIUM) 40 MG capsule Take 40 mg by mouth daily.    furosemide (LASIX) 20 MG tablet Take 20 mg by mouth daily with breakfast.     gabapentin (NEURONTIN) 400 MG capsule Take 400 mg by mouth 3 (three) times daily as needed (for nerve pain).     hyoscyamine (LEVBID) 0.375 MG 12 hr tablet Take 0.375 mg by mouth 2 (two) times daily as needed for cramping.      levothyroxine (SYNTHROID, LEVOTHROID) 75 MCG tablet Take 75 mcg by mouth daily before breakfast.     potassium chloride SA (K-DUR,KLOR-CON) 20 MEQ tablet Take 40 mEq by mouth daily.     QUEtiapine (SEROQUEL) 25 MG tablet Take 1 tablet (25 mg total) by mouth at bedtime. Qty: 30 tablet, Refills: 0    simvastatin (ZOCOR) 10 MG tablet Take 10 mg by mouth at bedtime.    venlafaxine XR (EFFEXOR-XR) 150 MG 24 hr capsule Take 150 mg by mouth daily.    folic acid (FOLVITE) 1 MG tablet Take 1 tablet (1 mg total) by mouth daily. Qty: 30 tablet, Refills: 0    thiamine 100 MG tablet Take 1 tablet (100 mg total) by mouth daily. Qty: 30 tablet, Refills: 0      STOP taking these medications     acetaminophen (TYLENOL 8 HOUR ARTHRITIS PAIN) 650 MG CR tablet      diltiazem (CARDIZEM CD) 180 MG 24 hr capsule          Brief H and P: For complete details please refer to admission H and P, but in brief Patient is a 81 year old female with history of seizures in 2017 felt most likely to be due to benzodiazepine withdrawal after an extensive neurological and psychiatric workup, she has history of anxiety. Patient presented to ED on 7/15 by EMS after unwitnessed fall. She was found on the floor by her husband, face down at 3 AM. He was unable to pick her up on his own, covered her with a blanket and eventually called the EMS at 10 AM. She was mildly confused on arrival, daughter noticed a mild right facial droop. EEG on 7/16 revealed mild diffuse slowing but no epileptiform discharges.  Hospital Course:  Acute encephalopathy and seizures - Currently improved, at baseline per her husband at the bedside. Likely due to benzodiazepine withdrawals - Patient has a history of prior benzodiazepine withdrawal seizures after an extensive neurological and psychiatric workup in 2017 - EEG on 7/16 revealed mild diffuse slowing but no epileptiform discharges. - Neurology was consulted this admission. On 7/17 she had  tremors that decreased after initiation of Xanax. Subsequently, she had an episode during which she had seizure-like activity activity with extension and jerking of both arms and then became unresponsive. - Continue thiamine supplementation - TSH 1.1, B12 822, folate 15.8 - Psychiatry was consulted, highly appreciate psychiatry assistance in weaning off the Xanax safely. Patient was seen by  Dr. Tory Emerald, recommended to continue Librium at current dose 10mg  tid to avoid any withdrawal seizures for next 10 days, then taper down to 5 mg TID, please see the weaning protocol and the discharge instructions above. - I explained the above plan to the patient and her husband, [name] [name] in detail who was very appreciative of the safe weaning off Xanax and having substitution of another benzodiazepine to  avoid the withdrawal seizures. We also talked about the concern about possibility of relapse if patient goes back to Xanax after she is released from the skilled nursing facility to home. Patient's husband states that he will discuss with her outpatient psychiatrist, Dr Toy Care in detail as this is becoming a serious concern and safety issue.   Dehydration - Improved, patient received IV fluid hydration  Multiple falls - Likely due to benzodiazepines and deconditioning - PTOT evaluation recommended skilled nursing facility  Rhabdomyolysis - CK improved 98, IV fluids discontinued  Failure to thrive - PT evaluation recommended skilled nursing facility  Left orbital inferior lateral mass - Incidental finding on MRI of the 06/26/2017 - Outpatient ophthalmology workup recommended  Hypertension - Currently stable, continue beta blocker, Norvasc 10 mg daily  Chronic diastolic CHF - No acute decompensation, currently stable  Hyperglycemia - Hemoglobin A1c 5.4 on 7/15  Paroxysmal atrial fibrillation - Currently rate controlled, on beta blocker and Norvasc - Patient states that she has never  been on Cardizem. Not on anticoagulation currently secondary to falls  Day of Discharge BP (!) 160/64 (BP Location: Right Arm)   Pulse 66   Temp 97.7 F (36.5 C) (Oral)   Resp 19   Ht 5\' 1"  (1.549 m)   Wt 73.7 kg (162 lb 7.7 oz)   SpO2 91%   BMI 30.70 kg/m   Physical Exam: General: Alert and awake oriented x3 not in any acute distress. HEENT: anicteric sclera, pupils reactive to light and accommodation CVS: S1-S2 clear no murmur rubs or gallops Chest: clear to auscultation bilaterally, no wheezing rales or rhonchi Abdomen: soft nontender, nondistended, normal bowel sounds Extremities: Slightly tremulous, no cyanosis, clubbing or edema noted bilaterally Neuro: Cranial nerves II-XII intact, no focal neurological deficits   The results of significant diagnostics from this hospitalization (including imaging, microbiology, ancillary and laboratory) are listed below for reference.    LAB RESULTS: Basic Metabolic Panel:  Recent Labs Lab 06/26/17 1701  07/01/17 0410 07/02/17 0452  NA  --   < > 142 139  K  --   < > 3.1* 3.2*  CL  --   < > 104 101  CO2  --   < > 28 30  GLUCOSE  --   < > 111* 118*  BUN  --   < > 16 20  CREATININE  --   < > 0.78 0.81  CALCIUM  --   < > 8.4* 8.7*  MG 1.8  --   --   --   PHOS 4.0  --   --   --   < > = values in this interval not displayed. Liver Function Tests:  Recent Labs Lab 06/26/17 1143 06/27/17 0325  AST 68* 60*  ALT 36 36  ALKPHOS 96 78  BILITOT 1.2 0.8  PROT 6.5 5.7*  ALBUMIN 3.4* 3.0*   No results for input(s): LIPASE, AMYLASE in the last 168 hours. No results for input(s): AMMONIA in the last 168 hours. CBC:  Recent Labs Lab 06/26/17 1143  06/27/17 0325 06/28/17 1845  WBC 15.1*  --  9.6 12.0*  NEUTROABS 13.7*  --   --   --   HGB 12.9  < > 11.3* 12.4  HCT 39.5  < > 35.7* 39.1  MCV 85.9  --  86.9 87.3  PLT 226  --  189 196  < > = values in this interval not displayed. Cardiac Enzymes:  Recent Labs Lab  06/28/17 0620 07/01/17 0410  CKTOTAL 725* 98   BNP: Invalid input(s): POCBNP CBG:  Recent Labs Lab 06/27/17 0612 06/28/17 1703  GLUCAP 214* 132*    Significant Diagnostic Studies:  No results found.  2D ECHO:   Disposition and Follow-up: Discharge Instructions    Diet - low sodium heart healthy    Complete by:  As directed    Increase activity slowly    Complete by:  As directed        DISPOSITION: [facility] information for follow-up providers    [name], MD. Schedule an appointment as soon as possible for a visit in 10 day(s).   Specialty:  Internal Medicine Contact information: [address] [place] [address] [phone]            Contact information for after-discharge care    Destination    [facility] Follow up.   Specialty:  Skilled Nursing Facility Contact information: [place] [phone]                   Time spent on Discharge: 83mins   Signed:   Estill Cotta M.D. [facility] 07/02/2017, 10:18 AM Pager: [phone]

## 2017-07-02 NOTE — Progress Notes (Signed)
Patient has 6/10 right anterior reoccurring h/a. Patient states she is tense and very anxious. Encouragement, distraction and repositioning provided minimal relief. Patient administered 650 mg Tylenol PO for h/a./ MD notified. RN will continue to monitor.

## 2017-07-02 NOTE — Progress Notes (Signed)
Report give to [facility].  Patient dressed, sitting in bedside chair awaiting transport.

## 2017-07-10 DIAGNOSIS — J309 Allergic rhinitis, unspecified: Secondary | ICD-10-CM | POA: Diagnosis not present

## 2017-07-10 DIAGNOSIS — F419 Anxiety disorder, unspecified: Secondary | ICD-10-CM | POA: Diagnosis not present

## 2017-07-11 LAB — CALCIUM, IONIZED: CALCIUM, IONIZED, SERUM: 4.5 mg/dL (ref 4.5–5.6)

## 2017-07-22 ENCOUNTER — Observation Stay (HOSPITAL_COMMUNITY)
Admission: EM | Admit: 2017-07-22 | Discharge: 2017-07-23 | Disposition: A | Discharge status: Home / Self Care | Payer: Medicare Other | Attending: Internal Medicine | Admitting: Internal Medicine

## 2017-07-22 ENCOUNTER — Encounter (HOSPITAL_COMMUNITY): Payer: Self-pay | Admitting: Emergency Medicine

## 2017-07-22 DIAGNOSIS — K219 Gastro-esophageal reflux disease without esophagitis: Secondary | ICD-10-CM | POA: Diagnosis not present

## 2017-07-22 DIAGNOSIS — Z96652 Presence of left artificial knee joint: Secondary | ICD-10-CM | POA: Insufficient documentation

## 2017-07-22 DIAGNOSIS — F1320 Sedative, hypnotic or anxiolytic dependence, uncomplicated: Secondary | ICD-10-CM | POA: Insufficient documentation

## 2017-07-22 DIAGNOSIS — K589 Irritable bowel syndrome without diarrhea: Secondary | ICD-10-CM | POA: Insufficient documentation

## 2017-07-22 DIAGNOSIS — I480 Paroxysmal atrial fibrillation: Secondary | ICD-10-CM | POA: Insufficient documentation

## 2017-07-22 DIAGNOSIS — I739 Peripheral vascular disease, unspecified: Secondary | ICD-10-CM | POA: Insufficient documentation

## 2017-07-22 DIAGNOSIS — Z91048 Other nonmedicinal substance allergy status: Secondary | ICD-10-CM | POA: Insufficient documentation

## 2017-07-22 DIAGNOSIS — F13180 Sedative, hypnotic or anxiolytic abuse with sedative, hypnotic or anxiolytic-induced anxiety disorder: Secondary | ICD-10-CM | POA: Diagnosis present

## 2017-07-22 DIAGNOSIS — F419 Anxiety disorder, unspecified: Secondary | ICD-10-CM | POA: Insufficient documentation

## 2017-07-22 DIAGNOSIS — Z887 Allergy status to serum and vaccine status: Secondary | ICD-10-CM | POA: Diagnosis not present

## 2017-07-22 DIAGNOSIS — Z8249 Family history of ischemic heart disease and other diseases of the circulatory system: Secondary | ICD-10-CM | POA: Insufficient documentation

## 2017-07-22 DIAGNOSIS — E875 Hyperkalemia: Principal | ICD-10-CM | POA: Insufficient documentation

## 2017-07-22 DIAGNOSIS — F329 Major depressive disorder, single episode, unspecified: Secondary | ICD-10-CM | POA: Insufficient documentation

## 2017-07-22 DIAGNOSIS — R9431 Abnormal electrocardiogram [ECG] [EKG]: Secondary | ICD-10-CM | POA: Diagnosis not present

## 2017-07-22 DIAGNOSIS — Z8673 Personal history of transient ischemic attack (TIA), and cerebral infarction without residual deficits: Secondary | ICD-10-CM | POA: Diagnosis not present

## 2017-07-22 DIAGNOSIS — Z7982 Long term (current) use of aspirin: Secondary | ICD-10-CM | POA: Diagnosis not present

## 2017-07-22 DIAGNOSIS — Z79899 Other long term (current) drug therapy: Secondary | ICD-10-CM | POA: Diagnosis not present

## 2017-07-22 DIAGNOSIS — I5032 Chronic diastolic (congestive) heart failure: Secondary | ICD-10-CM | POA: Diagnosis not present

## 2017-07-22 DIAGNOSIS — I10 Essential (primary) hypertension: Secondary | ICD-10-CM | POA: Diagnosis present

## 2017-07-22 DIAGNOSIS — I440 Atrioventricular block, first degree: Secondary | ICD-10-CM | POA: Insufficient documentation

## 2017-07-22 DIAGNOSIS — I110 Hypertensive heart disease with heart failure: Secondary | ICD-10-CM | POA: Insufficient documentation

## 2017-07-22 DIAGNOSIS — N179 Acute kidney failure, unspecified: Secondary | ICD-10-CM | POA: Diagnosis not present

## 2017-07-22 DIAGNOSIS — E039 Hypothyroidism, unspecified: Secondary | ICD-10-CM | POA: Insufficient documentation

## 2017-07-22 DIAGNOSIS — Z90711 Acquired absence of uterus with remaining cervical stump: Secondary | ICD-10-CM | POA: Diagnosis not present

## 2017-07-22 DIAGNOSIS — E785 Hyperlipidemia, unspecified: Secondary | ICD-10-CM | POA: Diagnosis not present

## 2017-07-22 LAB — BASIC METABOLIC PANEL
Anion gap: 14 (ref 5–15)
BUN: 37 mg/dL — ABNORMAL HIGH (ref 6–20)
CHLORIDE: 106 mmol/L (ref 101–111)
CO2: 19 mmol/L — AB (ref 22–32)
CREATININE: 1.59 mg/dL — AB (ref 0.44–1.00)
Calcium: 8.9 mg/dL (ref 8.9–10.3)
GFR calc non Af Amer: 30 mL/min — ABNORMAL LOW (ref >60)
GFR, EST AFRICAN AMERICAN: 35 mL/min — AB (ref >60)
GLUCOSE: 85 mg/dL (ref 65–99)
Potassium: 6.1 mmol/L — ABNORMAL HIGH (ref 3.5–5.1)
Sodium: 139 mmol/L (ref 135–145)

## 2017-07-22 LAB — URINALYSIS, ROUTINE W REFLEX MICROSCOPIC
BACTERIA UA: NONE SEEN
BILIRUBIN URINE: NEGATIVE
Glucose, UA: 50 mg/dL — AB
HGB URINE DIPSTICK: NEGATIVE
KETONES UR: NEGATIVE mg/dL
NITRITE: NEGATIVE
Protein, ur: 30 mg/dL — AB
SPECIFIC GRAVITY, URINE: 1.009 (ref 1.005–1.030)
pH: 6 (ref 5.0–8.0)

## 2017-07-22 LAB — CBC
HEMATOCRIT: 37.8 % (ref 36.0–46.0)
HEMOGLOBIN: 11.8 g/dL — AB (ref 12.0–15.0)
MCH: 27.4 pg (ref 26.0–34.0)
MCHC: 31.2 g/dL (ref 30.0–36.0)
MCV: 87.9 fL (ref 78.0–100.0)
Platelets: 230 10*3/uL (ref 150–400)
RBC: 4.3 MIL/uL (ref 3.87–5.11)
RDW: 14.4 % (ref 11.5–15.5)
WBC: 7.9 10*3/uL (ref 4.0–10.5)

## 2017-07-22 MED ORDER — SODIUM BICARBONATE 8.4 % IV SOLN
50.0000 meq | Freq: Once | INTRAVENOUS | Status: AC
  Administered 2017-07-22: 50 meq via INTRAVENOUS
  Filled 2017-07-22: qty 50

## 2017-07-22 MED ORDER — INSULIN ASPART 100 UNIT/ML ~~LOC~~ SOLN
10.0000 [IU] | Freq: Once | SUBCUTANEOUS | Status: AC
  Administered 2017-07-22: 10 [IU] via SUBCUTANEOUS
  Filled 2017-07-22: qty 1

## 2017-07-22 MED ORDER — SODIUM CHLORIDE 0.9 % IV BOLUS (SEPSIS)
500.0000 mL | Freq: Once | INTRAVENOUS | Status: AC
  Administered 2017-07-22: 500 mL via INTRAVENOUS

## 2017-07-22 MED ORDER — INSULIN ASPART 100 UNIT/ML IV SOLN: 10.0000 [IU] | Freq: Once | INTRAVENOUS | Status: DC

## 2017-07-22 MED ORDER — DEXTROSE 50 % IV SOLN
1.0000 | Freq: Once | INTRAVENOUS | Status: AC
Start: 2017-07-22 — End: 2017-07-22
  Administered 2017-07-22: 50 mL via INTRAVENOUS
  Filled 2017-07-22: qty 50

## 2017-07-22 MED ORDER — CALCIUM GLUCONATE 10 % IV SOLN
1.0000 g | Freq: Once | INTRAVENOUS | Status: AC
  Administered 2017-07-22: 1 g via INTRAVENOUS
  Filled 2017-07-22: qty 10

## 2017-07-22 NOTE — ED Provider Notes (Signed)
[facility] DEPT Provider Note   CSN: [id number] Arrival date & time: 07/22/17  1905   History   Chief Complaint Chief Complaint  Patient presents with  . Abnormal Lab    HPI [name] [name] is a 81 y.o. female who presented to her PCP for a routine evaluation whereupon it was noted that she was hyperkalemic at 6.7 mEq. She has a past medical history significant for HTN, diastolic CHF, anxiety, and seizure. She stated that she feels fine without concerns today. Patient noted that she has had several medication changes in the past few weeks but is unsure what these are. They were unable to confirm those changes but did note that she had a recent change in her BP medications as a results of overnight hypertension. In addition she stated that she is off her xanax now as well has having a change in one other medication. She is able to confirm that she is currently on PO potassium chloride at 67mEq daily as well as furosemide 20mg  daily. Patient denied additional concerns.   HPI  Past Medical History:  Diagnosis Date  . Alopecia   . Anemia    hx of years ago   . Anxiety   . ARF (acute renal failure) ([place]) 08/23/2014  . Arthritis    psoriatic arthritis  . Back pain   . Blood transfusion    at age of 4  . Carotid stenosis 12/2016  . Chronic diastolic CHF (congestive heart failure) ([place])   . Depression   . Dysrhythmia 11/13/2016   PAFib for a short time- Echo done 11/14/16 PAF- to follow up with PCP- to see if she is a candiate for anticoags.  Not started at times time due to alcohol abuse.  [name] GERD (gastroesophageal reflux disease)   . H/O hiatal hernia   . History of acute cholangitis   . Hyperlipidemia   . Hypertension   . Hypothyroidism   . IBS (irritable bowel syndrome)   . Pancreatitis   . Peripheral vascular disease ([place])   . PONV (postoperative nausea and vomiting)    with Breast reduction- only time  . Rosacea   . Seizures ([place]) 11/13/2016   Thopught to be from  withdrawl Benzodiazepine  . Stroke [facility])    patient denies-  . Thyroid disease     Patient Active Problem List   Diagnosis Date Noted  . 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 Fall 06/26/2017  . Intractable nausea and vomiting 03/11/2017  . Acute lower UTI 03/11/2017  . Carotid stenosis 12/28/2016  . Sedative, hypnotic, or anxiolytic withdrawal delirium ([place]) 11/15/2016  . Paroxysmal atrial fibrillation ([facility])   . Cerebrovascular disease   . Alcohol withdrawal delirium ([place])   . Fever   . Acute encephalopathy 11/10/2016  . Hypothyroidism 11/10/2016  . Hypertensive urgency 11/10/2016  . Altered mental state 11/10/2016  . Seizure ([place]) 11/10/2016  . Chronic diastolic CHF (congestive heart failure) ([place])   . Left carotid stenosis 10/29/2016  . S/P total knee replacement 03/10/2016  . 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 Ileus ([place]) 09/05/2014  . Leukocytosis 09/05/2014  . Vomiting 09/04/2014  . Pulmonary edema 09/04/2014  . Hypokalemia 08/28/2014  . Enteritis due to Clostridium difficile 08/27/2014  . 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 Protein-calorie malnutrition, severe ([place]) 08/24/2014  . Obstructive jaundice 08/23/2014  . Acute cholangitis 08/23/2014  . ARF (acute renal failure) ([place]) 08/23/2014  . Coagulopathy ([place]) 08/23/2014  .  Back pain   . Arthritis   . Hypertension   . GERD (gastroesophageal reflux disease)   . Anxiety   . IBS (irritable bowel syndrome)   . Hernia of Right posterior flank 04/18/2012    Past Surgical History:  Procedure Laterality Date  . ABDOMINAL HYSTERECTOMY     partial  . BACK SURGERY  2010   thoractic-screws and rods  . BILE DUCT STENT PLACEMENT  08/26/2014  . BREAST REDUCTION SURGERY    . CHOLECYSTECTOMY  1988   lap  . COLONOSCOPY    . COLONOSCOPY Left 09/08/2014   Procedure: COLONOSCOPY;  Surgeon: Arta Silence, MD;  Location: [facility] ENDOSCOPY;  Service: Endoscopy;  Laterality:  Left;  . ENDARTERECTOMY Left 12/28/2016  . ENDARTERECTOMY Left 12/28/2016   Procedure: ENDARTERECTOMY CAROTID LEFT WITH XENOSURE BIOLOGIC PATCH ANGIOPLASTY;  Surgeon: Angelia Mould, MD;  Location: [facility];  Service: Vascular;  Laterality: Left;  . ERCP    . ERCP N/A 08/26/2014   Procedure: ENDOSCOPIC RETROGRADE CHOLANGIOPANCREATOGRAPHY (ERCP);  Surgeon: Jeryl Columbia, MD;  Location: [facility] ENDOSCOPY;  Service: Endoscopy;  Laterality: N/A;  . ERCP N/A 09/11/2014   Procedure: ENDOSCOPIC RETROGRADE CHOLANGIOPANCREATOGRAPHY (ERCP);  Surgeon: Arta Silence, MD;  Location: [facility] ENDOSCOPY;  Service: Endoscopy;  Laterality: N/A;  . EUS N/A 09/11/2014   Procedure: ESOPHAGEAL ENDOSCOPIC ULTRASOUND (EUS) RADIAL;  Surgeon: Arta Silence, MD;  Location: [facility] ENDOSCOPY;  Service: Endoscopy;  Laterality: N/A;  . EYE SURGERY Bilateral    Cataract  . JOINT REPLACEMENT  2005   left shoulder replacement   . ORIF CLAVICULAR FRACTURE Left 07/18/2014   Procedure: OPEN REDUCTION INTERNAL FIXATION (ORIF) LEFT CLAVICULAR FRACTURE;  Surgeon: Ninetta Lights, MD;  Location: [facility];  Service: Orthopedics;  Laterality: Left;  . SPYGLASS CHOLANGIOSCOPY N/A 09/11/2014   Procedure: SPYGLASS CHOLANGIOSCOPY;  Surgeon: Arta Silence, MD;  Location: [facility] ENDOSCOPY;  Service: Endoscopy;  Laterality: N/A;  . TONSILLECTOMY  as child  . TOTAL KNEE ARTHROPLASTY Left 03/10/2016   Procedure: LEFT TOTAL KNEE ARTHROPLASTY;  Surgeon: Ninetta Lights, MD;  Location: [facility];  Service: Orthopedics;  Laterality: Left;  [name] VENTRAL HERNIA REPAIR  06/08/2012   Procedure: LAPAROSCOPIC VENTRAL HERNIA;  Surgeon: Adin Hector, MD;  Location: [facility] ORS;  Service: General;  Laterality: Right;    OB History    No data available       Home Medications    Prior to Admission medications   Medication Sig Start Date End Date Taking? Authorizing Provider  acetaminophen (TYLENOL) 325 MG tablet Take 2 tablets (650 mg total) by mouth every 6  (six) hours as needed for mild pain (or Fever >/= 101). 06/30/17  Yes Rai, Ripudeep K, MD  amLODipine (NORVASC) 10 MG tablet Take 1 tablet (10 mg total) by mouth daily. 07/02/17  Yes Rai, Ripudeep K, MD  amLODipine (NORVASC) 5 MG tablet Take 5 mg by mouth daily. 05/12/17  Yes [provider]  aspirin EC 81 MG tablet Take 81 mg by mouth daily.   Yes [provider]  atenolol (TENORMIN) 50 MG tablet Take 1 tablet (50 mg total) by mouth daily. 03/16/17  Yes Velvet Bathe, MD  chlordiazePOXIDE (LIBRIUM) 5 MG capsule Take 5 mg by mouth See admin instructions. Take 5 mg three times a day. On 07/26/2017 start taking 5 mg two times a day. 07/16/17  Yes [provider]  clonazePAM (KLONOPIN) 1 MG tablet Take 1 mg by mouth daily. 07/16/17  Yes [provider]  esomeprazole (NEXIUM) 40 MG capsule Take 40 mg by mouth daily. 08/31/16  Yes [provider]  folic acid (FOLVITE) 1 MG tablet Take 1 tablet (1 mg total) by mouth daily. 11/19/16  Yes Regalado, Belkys A, MD  furosemide (LASIX) 20 MG tablet Take 20 mg by mouth daily with breakfast.    Yes [provider]  gabapentin (NEURONTIN) 400 MG capsule Take 400 mg by mouth 3 (three) times daily as needed (for nerve pain).    Yes [provider]  levothyroxine (SYNTHROID, LEVOTHROID) 75 MCG tablet Take 75 mcg by mouth daily before breakfast.  08/28/16  Yes [provider]  losartan (COZAAR) 50 MG tablet Take 50 mg by mouth at bedtime. 07/16/17  Yes [provider]  potassium chloride SA (K-DUR,KLOR-CON) 20 MEQ tablet Take 40 mEq by mouth daily.    Yes [provider]  QUEtiapine (SEROQUEL) 25 MG tablet Take 1 tablet (25 mg total) by mouth at bedtime. 11/18/16  Yes Regalado, Belkys A, MD  simvastatin (ZOCOR) 10 MG tablet Take 10 mg by mouth at bedtime. 08/09/16  Yes [provider]  thiamine 100 MG tablet Take 1 tablet (100 mg total) by mouth daily. 11/19/16  Yes Regalado, Belkys A, MD    venlafaxine XR (EFFEXOR-XR) 150 MG 24 hr capsule Take 150 mg by mouth daily. 02/18/17  Yes [provider]  zolpidem (AMBIEN) 10 MG tablet Take 10 mg by mouth at bedtime. 06/23/17  Yes [provider]  ALPRAZolam Duanne Moron) 0.25 MG tablet Take 2 tabs (0.5 mg) TID x 1 more day, then 1 tab TID PO for 2 more days, then OFF or per MD evaluation Patient not taking: Reported on 07/22/2017 07/02/17   Mendel Corning, MD  aspirin-acetaminophen-caffeine (EXCEDRIN MIGRAINE) [phone] MG tablet Take 1-2 tablets by mouth every 6 (six) hours as needed for headache.    [provider]  chlordiazePOXIDE (LIBRIUM) 10 MG capsule Take 1 capsule (10 mg total) by mouth 3 (three) times daily. For 10 days, then 5 mg PO 3 (three) times daily for 2 weeks, then off or further instructions per MD evaluation Patient not taking: Reported on 07/22/2017 07/02/17   Rai, Vernelle Emerald, MD  feeding supplement, ENSURE ENLIVE, (ENSURE ENLIVE) LIQD Take 237 mLs by mouth 2 (two) times daily between meals. 06/30/17   Rai, Vernelle Emerald, MD  hyoscyamine (LEVBID) 0.375 MG 12 hr tablet Take 0.375 mg by mouth 2 (two) times daily as needed for cramping.  04/08/17   [provider]  zolpidem (AMBIEN) 5 MG tablet Take 1 tablet (5 mg total) by mouth at bedtime as needed for sleep. Patient not taking: Reported on 07/22/2017 07/02/17   Mendel Corning, MD    Family History Family History  Problem Relation Age of Onset  . Heart disease Mother   . Cancer Father        lung    Social History Social History  Substance Use Topics  . Smoking status: Never Smoker  . Smokeless tobacco: Never Used  . Alcohol use No     Comment: rare     Allergies   Tetanus toxoids and Yellow dyes (non-tartrazine)   Review of Systems Review of Systems  Constitutional: Negative for activity change and appetite change.  Eyes: Negative for pain and visual disturbance.  Respiratory: Negative for cough and shortness of breath.    Cardiovascular: Negative for chest pain and palpitations.  Gastrointestinal: Negative for abdominal distention and abdominal pain.  Skin: Negative for  color change, pallor, rash and wound.  Neurological: Negative for dizziness, tremors, weakness, light-headedness and numbness.  Psychiatric/Behavioral: Negative for agitation and confusion.  All other systems reviewed and are negative.   Physical Exam Updated Vital Signs BP (!) 167/69   Pulse 72   Temp 98.2 F (36.8 C) (Oral)   Resp 17   SpO2 96%   Physical Exam  Constitutional: She is oriented to person, place, and time. She appears well-developed and well-nourished. No distress.  HENT:  Head: Normocephalic and atraumatic.  Eyes: Pupils are equal, round, and reactive to light. EOM are normal. No scleral icterus.  Neck: Normal range of motion. Neck supple.  Cardiovascular: Normal rate and regular rhythm.   No murmur heard. Pulmonary/Chest: Effort normal. No respiratory distress. She has wheezes. She exhibits no tenderness.  Abdominal: Soft. Bowel sounds are normal. She exhibits no distension. There is no tenderness. There is no guarding.  Musculoskeletal: Normal range of motion. She exhibits no edema or tenderness.  Neurological: She is alert and oriented to person, place, and time.  Skin: Skin is warm and dry. Capillary refill takes less than 2 seconds. She is not diaphoretic.  Psychiatric: She has a normal mood and affect. Her behavior is normal. Thought content normal.  Nursing note and vitals reviewed.    ED Treatments / Results  Labs (all labs ordered are listed, but only abnormal results are displayed) Labs Reviewed  CBC - Abnormal; Notable for the following:       Result Value   Hemoglobin 11.8 (*)    All other components within normal limits  BASIC METABOLIC PANEL - Abnormal; Notable for the following:    Potassium 6.1 (*)    CO2 19 (*)    BUN 37 (*)    Creatinine, Ser 1.59 (*)    GFR calc non Af Amer 30 (*)     GFR calc Af Amer 35 (*)    All other components within normal limits  URINALYSIS, ROUTINE W REFLEX MICROSCOPIC  OSMOLALITY, URINE    EKG  EKG Interpretation  Date/Time:  Friday July 22 2017 19:59:36 EDT Ventricular Rate:  63 PR Interval:  246 QRS Duration: 82 QT Interval:  414 QTC Calculation: 423 R Axis:   -32 Text Interpretation:  Sinus rhythm with 1st degree A-V block Left axis deviation Left ventricular hypertrophy Abnormal ECG Confirmed by Quintella Reichert [phone]) on 07/22/2017 10:09:33 PM       Radiology No results found.  Procedures Procedures (including critical care time)  Medications Ordered in ED Medications  dextrose 50 % solution 50 mL (50 mLs Intravenous Given 07/22/17 2320)  sodium bicarbonate injection 50 mEq (50 mEq Intravenous Given 07/22/17 2323)  calcium gluconate inj 10% (1 g) URGENT USE ONLY! (1 g Intravenous Given 07/22/17 2313)  sodium chloride 0.9 % bolus 500 mL (500 mLs Intravenous New Bag/Given 07/22/17 2313)  insulin aspart (novoLOG) injection 10 Units (10 Units Subcutaneous Given 07/22/17 2326)     Initial Impression / Assessment and Plan / ED Course  I have reviewed the triage vital signs and the nursing notes.  Pertinent labs & imaging results that were available during my care of the patient were reviewed by me and considered in my medical decision making (see chart for details).    Assessment: [name] [name] is a 81 year old female who presented with incidental hyperkalemia upon routine lab testing. In addition she has creatinine of 1.59. ECG changes consistent with hyperkalemia accompanied by hyperkalemia with AKI secondary to probable dehydration.  Plan: Plan to admit for treatment of her hyperkalemia and gentle rehydration for her AKI. Initiated calcium gluconate, insulin, dextrose, sodium bicarb therapy  BMP, CBC otherwise unremarkable   Consulted [facility] hospitalist to admit for treatment of her hyperkalemia and AKI. They agreed to see  the patient.    Final Clinical Impressions(s) / ED Diagnoses   Final diagnoses:  Hyperkalemia  Acute kidney injury [facility])    New Prescriptions New Prescriptions   No medications on file     Kathi Ludwig, MD 07/22/17 2352    Quintella Reichert, MD 07/24/17 0005

## 2017-07-22 NOTE — ED Triage Notes (Signed)
Pt presents to ED after being called by her PCP and told her potassium was 6.7.  Patient denies recent diarrhea or vomiting.  Denies CP, SOB, abdominal pain.

## 2017-07-22 NOTE — ED Notes (Signed)
Pt's husband Timmothy Sours would like to be notified with any updates/ bed assignment. [phone]

## 2017-07-22 NOTE — ED Notes (Signed)
Wells Guiles, RN spoke to Dr. Ashok Cordia about Potassium.  Will increase acuity and focus on rooming.

## 2017-07-23 ENCOUNTER — Encounter (HOSPITAL_COMMUNITY): Payer: Self-pay | Admitting: *Deleted

## 2017-07-23 DIAGNOSIS — I10 Essential (primary) hypertension: Secondary | ICD-10-CM

## 2017-07-23 DIAGNOSIS — N179 Acute kidney failure, unspecified: Secondary | ICD-10-CM | POA: Diagnosis not present

## 2017-07-23 DIAGNOSIS — I480 Paroxysmal atrial fibrillation: Secondary | ICD-10-CM

## 2017-07-23 DIAGNOSIS — F13180 Sedative, hypnotic or anxiolytic abuse with sedative, hypnotic or anxiolytic-induced anxiety disorder: Secondary | ICD-10-CM

## 2017-07-23 DIAGNOSIS — E875 Hyperkalemia: Secondary | ICD-10-CM | POA: Diagnosis not present

## 2017-07-23 LAB — BASIC METABOLIC PANEL
Anion gap: 10 (ref 5–15)
BUN: 31 mg/dL — AB (ref 6–20)
CHLORIDE: 104 mmol/L (ref 101–111)
CO2: 26 mmol/L (ref 22–32)
Calcium: 8.8 mg/dL — ABNORMAL LOW (ref 8.9–10.3)
Creatinine, Ser: 1.18 mg/dL — ABNORMAL HIGH (ref 0.44–1.00)
GFR calc Af Amer: 50 mL/min — ABNORMAL LOW (ref >60)
GFR calc non Af Amer: 43 mL/min — ABNORMAL LOW (ref >60)
GLUCOSE: 157 mg/dL — AB (ref 65–99)
Potassium: 4.3 mmol/L (ref 3.5–5.1)
SODIUM: 140 mmol/L (ref 135–145)

## 2017-07-23 LAB — POTASSIUM: Potassium: 3.5 mmol/L (ref 3.5–5.1)

## 2017-07-23 LAB — CBG MONITORING, ED
Glucose-Capillary: 105 mg/dL — ABNORMAL HIGH (ref 65–99)
Glucose-Capillary: 59 mg/dL — ABNORMAL LOW (ref 65–99)

## 2017-07-23 LAB — OSMOLALITY, URINE: OSMOLALITY UR: 360 mosm/kg (ref 300–900)

## 2017-07-23 MED ORDER — CHLORDIAZEPOXIDE HCL 5 MG PO CAPS
5.0000 mg | ORAL_CAPSULE | Freq: Three times a day (TID) | ORAL | Status: DC
Start: 2017-07-23 — End: 2017-07-23
  Administered 2017-07-23 (×2): 5 mg via ORAL
  Filled 2017-07-23 (×2): qty 1

## 2017-07-23 MED ORDER — SODIUM CHLORIDE 0.9 % IV SOLN
INTRAVENOUS | Status: DC
  Administered 2017-07-23: 03:00:00 via INTRAVENOUS

## 2017-07-23 MED ORDER — POTASSIUM CHLORIDE CRYS ER 20 MEQ PO TBCR: 20.0000 meq | EXTENDED_RELEASE_TABLET | Freq: Every day | ORAL | Status: DC | PRN

## 2017-07-23 MED ORDER — ZOLPIDEM TARTRATE 5 MG PO TABS: 10.0000 mg | ORAL_TABLET | Freq: Every day | ORAL | Status: DC

## 2017-07-23 MED ORDER — CLONAZEPAM 1 MG PO TABS
1.0000 mg | ORAL_TABLET | Freq: Every day | ORAL | Status: DC
  Administered 2017-07-23: 1 mg via ORAL
  Filled 2017-07-23: qty 1

## 2017-07-23 MED ORDER — ONDANSETRON HCL 4 MG/2ML IJ SOLN: 4.0000 mg | Freq: Four times a day (QID) | INTRAMUSCULAR | Status: DC | PRN

## 2017-07-23 MED ORDER — PANTOPRAZOLE SODIUM 40 MG PO TBEC
80.0000 mg | DELAYED_RELEASE_TABLET | Freq: Every day | ORAL | Status: DC
  Administered 2017-07-23: 80 mg via ORAL
  Filled 2017-07-23: qty 2

## 2017-07-23 MED ORDER — ENOXAPARIN SODIUM 30 MG/0.3ML ~~LOC~~ SOLN: 30.0000 mg | SUBCUTANEOUS | Status: DC

## 2017-07-23 MED ORDER — CHLORDIAZEPOXIDE HCL 5 MG PO CAPS: 5.0000 mg | ORAL_CAPSULE | Freq: Three times a day (TID) | ORAL | Status: DC

## 2017-07-23 MED ORDER — FUROSEMIDE 20 MG PO TABS: 20.0000 mg | ORAL_TABLET | Freq: Every day | ORAL | 0 refills | Status: DC | PRN

## 2017-07-23 MED ORDER — DEXTROSE 50 % IV SOLN
INTRAVENOUS | Status: AC
  Administered 2017-07-23: 50 mL via INTRAVENOUS
  Filled 2017-07-23: qty 50

## 2017-07-23 MED ORDER — SIMVASTATIN 10 MG PO TABS
10.0000 mg | ORAL_TABLET | Freq: Every day | ORAL | Status: DC
  Administered 2017-07-23: 10 mg via ORAL
  Filled 2017-07-23: qty 1

## 2017-07-23 MED ORDER — CHLORDIAZEPOXIDE HCL 5 MG PO CAPS: 5.0000 mg | ORAL_CAPSULE | Freq: Two times a day (BID) | ORAL | Status: DC

## 2017-07-23 MED ORDER — ACETAMINOPHEN 325 MG PO TABS: 650.0000 mg | ORAL_TABLET | Freq: Four times a day (QID) | ORAL | Status: DC | PRN

## 2017-07-23 MED ORDER — ZOLPIDEM TARTRATE 5 MG PO TABS: 5.0000 mg | ORAL_TABLET | Freq: Every evening | ORAL | Status: DC | PRN

## 2017-07-23 MED ORDER — ENSURE ENLIVE PO LIQD
237.0000 mL | Freq: Two times a day (BID) | ORAL | Status: DC
  Administered 2017-07-23: 237 mL via ORAL

## 2017-07-23 MED ORDER — ATENOLOL 50 MG PO TABS
50.0000 mg | ORAL_TABLET | Freq: Every day | ORAL | Status: DC
  Administered 2017-07-23: 50 mg via ORAL
  Filled 2017-07-23: qty 1

## 2017-07-23 MED ORDER — VITAMIN B-1 100 MG PO TABS
100.0000 mg | ORAL_TABLET | Freq: Every day | ORAL | Status: DC
  Administered 2017-07-23: 100 mg via ORAL
  Filled 2017-07-23: qty 1

## 2017-07-23 MED ORDER — AMLODIPINE BESYLATE 10 MG PO TABS
10.0000 mg | ORAL_TABLET | Freq: Every day | ORAL | Status: DC
  Administered 2017-07-23: 10 mg via ORAL
  Filled 2017-07-23: qty 1

## 2017-07-23 MED ORDER — ASPIRIN EC 81 MG PO TBEC
81.0000 mg | DELAYED_RELEASE_TABLET | Freq: Every day | ORAL | Status: DC
  Administered 2017-07-23: 81 mg via ORAL
  Filled 2017-07-23: qty 1

## 2017-07-23 MED ORDER — DEXTROSE 50 % IV SOLN
1.0000 | Freq: Once | INTRAVENOUS | Status: AC
  Administered 2017-07-23: 50 mL via INTRAVENOUS

## 2017-07-23 MED ORDER — VENLAFAXINE HCL ER 150 MG PO CP24
150.0000 mg | ORAL_CAPSULE | Freq: Every day | ORAL | Status: DC
  Administered 2017-07-23: 150 mg via ORAL
  Filled 2017-07-23: qty 2
  Filled 2017-07-23: qty 1

## 2017-07-23 MED ORDER — FOLIC ACID 1 MG PO TABS
1.0000 mg | ORAL_TABLET | Freq: Every day | ORAL | Status: DC
  Administered 2017-07-23: 1 mg via ORAL
  Filled 2017-07-23: qty 1

## 2017-07-23 MED ORDER — ENOXAPARIN SODIUM 40 MG/0.4ML ~~LOC~~ SOLN: 40.0000 mg | SUBCUTANEOUS | Status: DC

## 2017-07-23 MED ORDER — QUETIAPINE FUMARATE 25 MG PO TABS
25.0000 mg | ORAL_TABLET | Freq: Every day | ORAL | Status: DC
  Administered 2017-07-23: 25 mg via ORAL
  Filled 2017-07-23: qty 1

## 2017-07-23 MED ORDER — LEVOTHYROXINE SODIUM 75 MCG PO TABS
75.0000 ug | ORAL_TABLET | Freq: Every day | ORAL | Status: DC
  Administered 2017-07-23: 75 ug via ORAL
  Filled 2017-07-23: qty 1

## 2017-07-23 MED ORDER — ACETAMINOPHEN 325 MG PO TABS
650.0000 mg | ORAL_TABLET | Freq: Four times a day (QID) | ORAL | Status: DC | PRN
  Administered 2017-07-23 (×2): 650 mg via ORAL
  Filled 2017-07-23 (×2): qty 2

## 2017-07-23 MED ORDER — GABAPENTIN 400 MG PO CAPS: 400.0000 mg | ORAL_CAPSULE | Freq: Three times a day (TID) | ORAL | Status: DC | PRN

## 2017-07-23 NOTE — H&P (Signed)
History and Physical    [name] [name] QQP:[id number] DOB: 1936/05/20 DOA: 07/22/2017  PCP: [name], MD  Patient coming from: Home  I have personally briefly reviewed patient's old medical records in [place]  Chief Complaint: Hyperkalemia  HPI: CAROLYNE [name] is a 81 y.o. female with medical history significant of HTN, chronic diastolic CHF, 30 year history of xanax use / abuse recently finally taken off of this and transitioned to long duration librium taper.  Patient recently had BP meds adjusted by PCP, she isnt sure exactly how.  Went in for follow up visit and had labs drawn.  Although she was asymptomatic, she was called by PCP and told to get in to ED because her K was 6.7.   ED Course: K of 6.1, creat 1.6 up from baseline of 0.8.  Given hyperkalemia treament measures for K, repeat K is 3.5.   Review of Systems: As per HPI otherwise 10 point review of systems negative.   Past Medical History:  Diagnosis Date  . Alopecia   . Anemia    hx of years ago   . Anxiety   . ARF (acute renal failure) ([place]) 08/23/2014  . Arthritis    psoriatic arthritis  . Back pain   . Blood transfusion    at age of 4  . Carotid stenosis 12/2016  . Chronic diastolic CHF (congestive heart failure) ([place])   . Depression   . Dysrhythmia 11/13/2016   PAFib for a short time- Echo done 11/14/16 PAF- to follow up with PCP- to see if she is a candiate for anticoags.  Not started at times time due to alcohol abuse.  [name] GERD (gastroesophageal reflux disease)   . H/O hiatal hernia   . History of acute cholangitis   . Hyperlipidemia   . Hypertension   . Hypothyroidism   . IBS (irritable bowel syndrome)   . Pancreatitis   . Peripheral vascular disease ([place])   . PONV (postoperative nausea and vomiting)    with Breast reduction- only time  . Rosacea   . Seizures ([place]) 11/13/2016   Thopught to be from withdrawl Benzodiazepine  . Stroke [facility])    patient denies-  . Thyroid disease      Past Surgical History:  Procedure Laterality Date  . ABDOMINAL HYSTERECTOMY     partial  . BACK SURGERY  2010   thoractic-screws and rods  . BILE DUCT STENT PLACEMENT  08/26/2014  . BREAST REDUCTION SURGERY    . CHOLECYSTECTOMY  1988   lap  . COLONOSCOPY    . COLONOSCOPY Left 09/08/2014   Procedure: COLONOSCOPY;  Surgeon: Arta Silence, MD;  Location: [facility] ENDOSCOPY;  Service: Endoscopy;  Laterality: Left;  . ENDARTERECTOMY Left 12/28/2016  . ENDARTERECTOMY Left 12/28/2016   Procedure: ENDARTERECTOMY CAROTID LEFT WITH XENOSURE BIOLOGIC PATCH ANGIOPLASTY;  Surgeon: Angelia Mould, MD;  Location: [place];  Service: Vascular;  Laterality: Left;  . ERCP    . ERCP N/A 08/26/2014   Procedure: ENDOSCOPIC RETROGRADE CHOLANGIOPANCREATOGRAPHY (ERCP);  Surgeon: Jeryl Columbia, MD;  Location[facility] ENDOSCOPY;  Service: Endoscopy;  Laterality: N/A;  . ERCP N/A 09/11/2014   Procedure: ENDOSCOPIC RETROGRADE CHOLANGIOPANCREATOGRAPHY (ERCP);  Surgeon: Arta Silence, MD;  Location: [facility] ENDOSCOPY;  Service: Endoscopy;  Laterality: N/A;  . EUS N/A 09/11/2014   Procedure: ESOPHAGEAL ENDOSCOPIC ULTRASOUND (EUS) RADIAL;  Surgeon: Arta Silence, MD;  Location: [facility] ENDOSCOPY;  Service: Endoscopy;  Laterality: N/A;  . EYE SURGERY Bilateral    Cataract  .  JOINT REPLACEMENT  2005   left shoulder replacement   . ORIF CLAVICULAR FRACTURE Left 07/18/2014   Procedure: OPEN REDUCTION INTERNAL FIXATION (ORIF) LEFT CLAVICULAR FRACTURE;  Surgeon: Ninetta Lights, MD;  Location: [place];  Service: Orthopedics;  Laterality: Left;  . SPYGLASS CHOLANGIOSCOPY N/A 09/11/2014   Procedure: SPYGLASS CHOLANGIOSCOPY;  Surgeon: Arta Silence, MD;  Location: [facility] ENDOSCOPY;  Service: Endoscopy;  Laterality: N/A;  . TONSILLECTOMY  as child  . TOTAL KNEE ARTHROPLASTY Left 03/10/2016   Procedure: LEFT TOTAL KNEE ARTHROPLASTY;  Surgeon: Ninetta Lights, MD;  Location: [place];  Service: Orthopedics;  Laterality: Left;  [name]  VENTRAL HERNIA REPAIR  06/08/2012   Procedure: LAPAROSCOPIC VENTRAL HERNIA;  Surgeon: Adin Hector, MD;  Location: [facility] ORS;  Service: General;  Laterality: Right;     reports that she has never smoked. She has never used smokeless tobacco. She reports that she does not drink alcohol or use drugs.  Allergies  Allergen Reactions  . Tetanus Toxoids Swelling and Other (See Comments)    Arm (site) became swollen as a child  . Yellow Dyes (Non-Tartrazine) Itching and Other (See Comments)    Makes patient nervous     Family History  Problem Relation Age of Onset  . Heart disease Mother   . Cancer Father        lung    Prior to Admission medications   Medication Sig Start Date End Date Taking? Authorizing Provider  acetaminophen (TYLENOL) 325 MG tablet Take 2 tablets (650 mg total) by mouth every 6 (six) hours as needed for mild pain (or Fever >/= 101). 06/30/17  Yes Rai, Ripudeep K, MD  amLODipine (NORVASC) 10 MG tablet Take 1 tablet (10 mg total) by mouth daily. 07/02/17  Yes Rai, Ripudeep K, MD  aspirin EC 81 MG tablet Take 81 mg by mouth daily.   Yes [provider]  atenolol (TENORMIN) 50 MG tablet Take 1 tablet (50 mg total) by mouth daily. 03/16/17  Yes Velvet Bathe, MD  chlordiazePOXIDE (LIBRIUM) 5 MG capsule Take 5 mg by mouth See admin instructions. Take 5 mg three times a day. On 07/26/2017 start taking 5 mg two times a day. 07/16/17  Yes [provider]  clonazePAM (KLONOPIN) 1 MG tablet Take 1 mg by mouth daily. 07/16/17  Yes [provider]  esomeprazole (NEXIUM) 40 MG capsule Take 40 mg by mouth daily. 08/31/16  Yes [provider]  folic acid (FOLVITE) 1 MG tablet Take 1 tablet (1 mg total) by mouth daily. 11/19/16  Yes Regalado, Belkys A, MD  furosemide (LASIX) 20 MG tablet Take 20 mg by mouth daily with breakfast.    Yes [provider]  gabapentin (NEURONTIN) 400 MG capsule Take 400 mg by mouth 3 (three) times daily as needed (for nerve  pain).    Yes [provider]  levothyroxine (SYNTHROID, LEVOTHROID) 75 MCG tablet Take 75 mcg by mouth daily before breakfast.  08/28/16  Yes [provider]  losartan (COZAAR) 50 MG tablet Take 50 mg by mouth at bedtime. 07/16/17  Yes [provider]  potassium chloride SA (K-DUR,KLOR-CON) 20 MEQ tablet Take 40 mEq by mouth daily.    Yes [provider]  QUEtiapine (SEROQUEL) 25 MG tablet Take 1 tablet (25 mg total) by mouth at bedtime. 11/18/16  Yes Regalado, Belkys A, MD  simvastatin (ZOCOR) 10 MG tablet Take 10 mg by mouth at bedtime. 08/09/16  Yes [provider]  thiamine 100  MG tablet Take 1 tablet (100 mg total) by mouth daily. 11/19/16  Yes Regalado, Belkys A, MD  venlafaxine XR (EFFEXOR-XR) 150 MG 24 hr capsule Take 150 mg by mouth daily. 02/18/17  Yes [provider]  zolpidem (AMBIEN) 10 MG tablet Take 10 mg by mouth at bedtime. 06/23/17  Yes [provider]  aspirin-acetaminophen-caffeine (EXCEDRIN MIGRAINE) [phone] MG tablet Take 1-2 tablets by mouth every 6 (six) hours as needed for headache.    [provider]  feeding supplement, ENSURE ENLIVE, (ENSURE ENLIVE) LIQD Take 237 mLs by mouth 2 (two) times daily between meals. 06/30/17   Rai, Vernelle Emerald, MD  hyoscyamine (LEVBID) 0.375 MG 12 hr tablet Take 0.375 mg by mouth 2 (two) times daily as needed for cramping.  04/08/17   [provider]  zolpidem (AMBIEN) 5 MG tablet Take 1 tablet (5 mg total) by mouth at bedtime as needed for sleep. Patient not taking: Reported on 07/22/2017 07/02/17   Mendel Corning, MD    Physical Exam: Vitals:   07/23/17 0100 07/23/17 0130 07/23/17 0200 07/23/17 0243  BP: (!) 151/78 (!) 148/64 (!) 125/54 (!) 157/61  Pulse: 66 74 76 71  Resp: 19 18 18 18   Temp:    (!) 97.5 F (36.4 C)  TempSrc:    Oral  SpO2: 99% 98% 95% 97%  Weight:  73.7 kg (162 lb 7.7 oz)  69.6 kg (153 lb 8 oz)  Height:  5\' 1"  (1.549 m)      Constitutional:  NAD, calm, comfortable Eyes: PERRL, lids and conjunctivae normal ENMT: Mucous membranes are moist. Posterior pharynx clear of any exudate or lesions.Normal dentition.  Neck: normal, supple, no masses, no thyromegaly Respiratory: clear to auscultation bilaterally, no wheezing, no crackles. Normal respiratory effort. No accessory muscle use.  Cardiovascular: Regular rate and rhythm, no murmurs / rubs / gallops. No extremity edema. 2+ pedal pulses. No carotid bruits.  Abdomen: no tenderness, no masses palpated. No hepatosplenomegaly. Bowel sounds positive.  Musculoskeletal: no clubbing / cyanosis. No joint deformity upper and lower extremities. Good ROM, no contractures. Normal muscle tone.  Skin: no rashes, lesions, ulcers. No induration Neurologic: CN 2-12 grossly intact. Sensation intact, DTR normal. Strength 5/5 in all 4.  Psychiatric: Normal judgment and insight. Alert and oriented x 3. Normal mood.    Labs on Admission: I have personally reviewed following labs and imaging studies  CBC:  Recent Labs Lab 07/22/17 2012  WBC 7.9  HGB 11.8*  HCT 37.8  MCV 87.9  PLT 063   Basic Metabolic Panel:  Recent Labs Lab 07/22/17 2012 07/23/17 0152  NA 139  --   K 6.1* 3.5  CL 106  --   CO2 19*  --   GLUCOSE 85  --   BUN 37*  --   CREATININE 1.59*  --   CALCIUM 8.9  --    GFR: Estimated Creatinine Clearance: 26 mL/min (A) (by C-G formula based on SCr of 1.59 mg/dL (H)). Liver Function Tests: No results for input(s): AST, ALT, ALKPHOS, BILITOT, PROT, ALBUMIN in the last 168 hours. No results for input(s): LIPASE, AMYLASE in the last 168 hours. No results for input(s): AMMONIA in the last 168 hours. Coagulation Profile: No results for input(s): INR, PROTIME in the last 168 hours. Cardiac Enzymes: No results for input(s): CKTOTAL, CKMB, CKMBINDEX, TROPONINI in the last 168 hours. BNP (last 3 results) No results for input(s): PROBNP in the last 8760 hours. HbA1C: No results for  input(s): HGBA1C  in the last 72 hours. CBG:  Recent Labs Lab 07/23/17 0045 07/23/17 0148  GLUCAP 105* 59*   Lipid Profile: No results for input(s): CHOL, HDL, LDLCALC, TRIG, CHOLHDL, LDLDIRECT in the last 72 hours. Thyroid Function Tests: No results for input(s): TSH, T4TOTAL, FREET4, T3FREE, THYROIDAB in the last 72 hours. Anemia Panel: No results for input(s): VITAMINB12, FOLATE, FERRITIN, TIBC, IRON, RETICCTPCT in the last 72 hours. Urine analysis:    Component Value Date/Time   COLORURINE STRAW (A) 07/22/2017 2329   APPEARANCEUR CLEAR 07/22/2017 2329   LABSPEC 1.009 07/22/2017 2329   PHURINE 6.0 07/22/2017 2329   GLUCOSEU 50 (A) 07/22/2017 2329   HGBUR NEGATIVE 07/22/2017 2329   BILIRUBINUR NEGATIVE 07/22/2017 2329   KETONESUR NEGATIVE 07/22/2017 2329   PROTEINUR 30 (A) 07/22/2017 2329   UROBILINOGEN 0.2 09/04/2014 1340   NITRITE NEGATIVE 07/22/2017 2329   LEUKOCYTESUR TRACE (A) 07/22/2017 2329    Radiological Exams on Admission: No results found.  EKG: Independently reviewed.  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 Hyperkalemia - resolved and repeat K is currently 3.5 1. Hold ARB 2. Hold PO potassium pills 3. IVF: 500 cc bolus in ED and 100 cc/hr 4. Repeat BMP at 0500 5. Tele monitor 2. AKI - 1. Holding ARB 2. Holding lasix 3. IVF 4. Repeat BMP in AM 3. chronic anxiety disorder, prior benzo use/abuse currently on long duration taper of librium - 1. Continue current home librium taper, currently at 5mg  TID 2. Ambien QHS PRN 3. Klonopin daily 4. HTN - continue norvasc, continue atenolol  DVT prophylaxis: Lovenox Code Status: Full Family Communication: No family in room Disposition Plan: Home after admit Consults called: None Admission status: Place in obs   Hady Niemczyk, [facility] Pager [phone]  If 7AM-7PM, please contact day team taking care of patient [email or web address] Password [email or web address]  07/23/2017, 3:00 AM

## 2017-07-23 NOTE — Progress Notes (Signed)
Not present on discharge

## 2017-07-23 NOTE — Progress Notes (Signed)
[name] to be D/C'd home per MD order.  Discussed with the patient and all questions fully answered.  VSS, Skin clean, dry and intact without evidence of skin break down, no evidence of skin tears noted. IV catheter discontinued intact. Site without signs and symptoms of complications. Dressing and pressure applied.  An After Visit Summary was printed and given to the patient. Patient received prescription.  D/c education completed with patient/family including follow up instructions, medication list, d/c activities limitations if indicated, with other d/c instructions as indicated by MD - patient able to verbalize understanding, all questions fully answered.   Patient instructed to return to ED, call 911, or call MD for any changes in condition.   Patient escorted via [place], and D/C home via private auto.  Milas Hock 07/23/2017 1:59 PM

## 2017-07-23 NOTE — ED Notes (Signed)
Admitting MD at bedside.

## 2017-07-23 NOTE — Discharge Summary (Signed)
Physician Discharge Summary  [name] [name] YHC:[id number] DOB: 21-Jun-1936 DOA: 07/22/2017  PCP: [name], MD  Admit date: 07/22/2017 Discharge date: 07/23/2017  Admitted From: home Disposition:  home   Recommendations for Outpatient Follow-up:  1. 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   Brief Summary: [name] [name] is a 81 y.o. female with medical history of silent lacunar infarcts, carotid artery disease s/p CEA 12/2016, PAF (not on anticoagulation due to falls) HTN, chronic diastolic CHF, 30 year history of xanax use / abuse admitted from 7/15 to 7/21 with seizures due to Xanax withdrawal, switched to Librium wean.   Presented to ER when PCP found her K to be 6.7. No physical complaints. Takes Lasix 20 mg, KCL 40 mEq and Cozaar at home.  ER: K 6.1, BUN 37, Cr 1.59 (baseline 20 and 0.81) Last ECHO 12/17- normal EF but grade 2 [facility]   Hospital Course:  Hyperkalemia and AKI - she states Losartan was just started about 1 wk ago- will hold this - she will see her PCP on Monday for Bmet - no EKG changes - hyperkalemia resolved with IVF, sodium bicarb injection, D50, Insulin. No kayexalate given - Cr 1.59 >> 1.18 - recommend use Lasix and K PRN (and weigh daily)    HTN - Norvasc, Atenolol- d/c Cozaar- PRN Lasix  Discharge Instructions  Discharge Instructions    (HEART FAILURE PATIENTS) Call MD:  Anytime you have any of the following symptoms: 1) 3 pound weight gain in 24 hours or 5 pounds in 1 week 2) shortness of breath, with or without a dry hacking cough 3) swelling in the hands, feet or stomach 4) if you have to sleep on extra pillows at night in  order to breathe.    Complete by:  As directed    Diet - low sodium heart healthy    Complete by:  As directed    Increase activity slowly    Complete by:  As directed      Allergies as of 07/23/2017      Reactions   Tetanus Toxoids Swelling, Other (See Comments)   Arm (site) became swollen as a child   Yellow Dyes (non-tartrazine) Itching, Other (See Comments)   Makes patient nervous       Medication List    STOP taking these medications   losartan 50 MG tablet Commonly known as:  COZAAR     TAKE these medications   acetaminophen 325 MG tablet Commonly known as:  TYLENOL Take 2 tablets (650 mg total) by mouth every 6 (six) hours as needed for mild pain (or Fever >/= 101).   amLODipine 10 MG tablet Commonly known as:  NORVASC Take 1 tablet (10 mg total) by mouth daily.   aspirin EC 81 MG tablet Take 81 mg by mouth daily.   aspirin-acetaminophen-caffeine 250-250-65 MG tablet Commonly known as:  EXCEDRIN MIGRAINE Take 1-2 tablets by mouth every 6 (six) hours as needed for headache.   atenolol 50 MG tablet Commonly known as:  TENORMIN Take 1 tablet (50 mg total) by mouth daily.   chlordiazePOXIDE 5 MG capsule Commonly known as:  LIBRIUM  Take 5 mg by mouth See admin instructions. Take 5 mg three times a day. On 07/26/2017 start taking 5 mg two times a day.   clonazePAM 1 MG tablet Commonly known as:  KLONOPIN Take 1 mg by mouth daily.   esomeprazole 40 MG capsule Commonly known as:  NEXIUM Take 40 mg by mouth daily.   feeding supplement (ENSURE ENLIVE) Liqd Take 237 mLs by mouth 2 (two) times daily between meals.   folic acid 1 MG tablet Commonly known as:  FOLVITE Take 1 tablet (1 mg total) by mouth daily.   furosemide 20 MG tablet Commonly known as:  LASIX Take 1 tablet (20 mg total) by mouth daily as needed. For > 3 lb weight gain in 24 hours. What changed:  when to take this  reasons to take this  additional instructions   gabapentin 400 MG  capsule Commonly known as:  NEURONTIN Take 400 mg by mouth 3 (three) times daily as needed (for nerve pain).   hyoscyamine 0.375 MG 12 hr tablet Commonly known as:  LEVBID Take 0.375 mg by mouth 2 (two) times daily as needed for cramping.   levothyroxine 75 MCG tablet Commonly known as:  SYNTHROID, LEVOTHROID Take 75 mcg by mouth daily before breakfast.   potassium chloride SA 20 MEQ tablet Commonly known as:  K-DUR,KLOR-CON Take 1 tablet (20 mEq total) by mouth daily as needed. Take only when taking Lasix. What changed:  how much to take  when to take this  reasons to take this  additional instructions   QUEtiapine 25 MG tablet Commonly known as:  SEROQUEL Take 1 tablet (25 mg total) by mouth at bedtime.   simvastatin 10 MG tablet Commonly known as:  ZOCOR Take 10 mg by mouth at bedtime.   thiamine 100 MG tablet Take 1 tablet (100 mg total) by mouth daily.   venlafaxine XR 150 MG 24 hr capsule Commonly known as:  EFFEXOR-XR Take 150 mg by mouth daily.   zolpidem 10 MG tablet Commonly known as:  AMBIEN Take 10 mg by mouth at bedtime. What changed:  Another medication with the same name was removed. Continue taking this medication, and follow the directions you see here.       Allergies  Allergen Reactions  . Tetanus Toxoids Swelling and Other (See Comments)    Arm (site) became swollen as a child  . Yellow Dyes (Non-Tartrazine) Itching and Other (See Comments)    Makes patient nervous      Procedures/Studies:   Dg Chest 2 View  Result Date: 06/26/2017 CLINICAL DATA:  Fall, altered mental status EXAM: CHEST  2 VIEW COMPARISON:  03/11/2017 FINDINGS: Mild cardiomegaly without CHF, focal pneumonia, collapse, or consolidation. No effusion or pneumothorax. Trachea is midline. Postop changes of the thoracolumbar spine, left clavicle and left shoulder. Monitor leads overlie the chest. Remote cholecystectomy noted. IMPRESSION: Cardiomegaly without acute chest  process Electronically Signed   By: Jerilynn Mages.  Shick M.D.   On: 06/26/2017 12:22   Ct Head Wo Contrast  Result Date: 06/26/2017 CLINICAL DATA:  Slurred speech. Possible right facial droop. Found on the floor this morning at 3 a.m. EXAM: CT HEAD WITHOUT CONTRAST CT CERVICAL SPINE WITHOUT CONTRAST TECHNIQUE: Multidetector CT imaging of the head and cervical spine was performed following the standard protocol without intravenous contrast. Multiplanar CT image reconstructions of the cervical spine were also generated. COMPARISON:  Brain MR dated 11/11/2016. Head CT dated 11/10/2016 and cervical spine CT dated 09/06/2016. FINDINGS: CT HEAD FINDINGS  Brain: Diffusely enlarged ventricles and subarachnoid spaces. Patchy white matter low density in both cerebral hemispheres. No intracranial hemorrhage, mass lesion or CT evidence of acute infarction. Vascular: No hyperdense vessel or unexpected calcification. Skull: Normal. Negative for fracture or focal lesion. Sinuses/Orbits: Small amount of fluid in the left maxillary sinus. Unremarkable orbits. Other: None. CT CERVICAL SPINE FINDINGS Alignment: Normal. Skull base and vertebrae: No acute fracture. No primary bone lesion or focal pathologic process. Soft tissues and spinal canal: No prevertebral fluid or swelling. No visible canal hematoma. Disc levels: Stable disc space narrowing and mild anterior and minimal posterior spur formation at the C5-6 and C6-7 levels. Facet degenerative changes at multiple levels. Upper chest: Stable mild patchy interstitial prominence at both lung apices. Other: Post carotid endarterectomy changes on the left and mild right carotid artery calcifications. IMPRESSION: 1. No skull fracture or intracranial hemorrhage. 2. No cervical spine fracture or subluxation. 3. Stable mild diffuse cerebral and cerebellar atrophy and mild chronic small vessel white matter ischemic changes in both cerebral hemispheres. 4. Stable cervical spine degenerative changes.  5. Mild right carotid artery atheromatous calcifications. 6. Stable mild chronic interstitial lung disease. Electronically Signed   By: Claudie Revering M.D.   On: 06/26/2017 12:15   Ct Cervical Spine Wo Contrast  Result Date: 06/26/2017 CLINICAL DATA:  Slurred speech. Possible right facial droop. Found on the floor this morning at 3 a.m. EXAM: CT HEAD WITHOUT CONTRAST CT CERVICAL SPINE WITHOUT CONTRAST TECHNIQUE: Multidetector CT imaging of the head and cervical spine was performed following the standard protocol without intravenous contrast. Multiplanar CT image reconstructions of the cervical spine were also generated. COMPARISON:  Brain MR dated 11/11/2016. Head CT dated 11/10/2016 and cervical spine CT dated 09/06/2016. FINDINGS: CT HEAD FINDINGS Brain: Diffusely enlarged ventricles and subarachnoid spaces. Patchy white matter low density in both cerebral hemispheres. No intracranial hemorrhage, mass lesion or CT evidence of acute infarction. Vascular: No hyperdense vessel or unexpected calcification. Skull: Normal. Negative for fracture or focal lesion. Sinuses/Orbits: Small amount of fluid in the left maxillary sinus. Unremarkable orbits. Other: None. CT CERVICAL SPINE FINDINGS Alignment: Normal. Skull base and vertebrae: No acute fracture. No primary bone lesion or focal pathologic process. Soft tissues and spinal canal: No prevertebral fluid or swelling. No visible canal hematoma. Disc levels: Stable disc space narrowing and mild anterior and minimal posterior spur formation at the C5-6 and C6-7 levels. Facet degenerative changes at multiple levels. Upper chest: Stable mild patchy interstitial prominence at both lung apices. Other: Post carotid endarterectomy changes on the left and mild right carotid artery calcifications. IMPRESSION: 1. No skull fracture or intracranial hemorrhage. 2. No cervical spine fracture or subluxation. 3. Stable mild diffuse cerebral and cerebellar atrophy and mild chronic small  vessel white matter ischemic changes in both cerebral hemispheres. 4. Stable cervical spine degenerative changes. 5. Mild right carotid artery atheromatous calcifications. 6. Stable mild chronic interstitial lung disease. Electronically Signed   By: Claudie Revering M.D.   On: 06/26/2017 12:15   Mr Brain Wo Contrast  Result Date: 06/26/2017 CLINICAL DATA:  Unwitnessed fall. Patient found down. Query seizure, infection, versus overdose. EXAM: MRI HEAD WITHOUT CONTRAST TECHNIQUE: Multiplanar, multiecho pulse sequences of the brain and surrounding structures were obtained without intravenous contrast. COMPARISON:  MR brain 11/11/2016.  CT head 06/26/2017. FINDINGS: Brain: No evidence for acute infarction, hemorrhage, mass lesion, hydrocephalus, or extra-axial fluid. Generalized atrophy. Chronic microvascular ischemic change. Vascular: Normal flow voids. Skull and upper cervical spine: Normal marrow signal.  Sinuses/Orbits: Negative sinuses. In the inferolateral orbit on the LEFT there is an 11 x 10 x 11 mm rounded mass, which may AP extraconal or may be intimately associated with an extra ocular muscle (inferior oblique close). On CT the lesion is hyperdense. On MR there is no significant restriction. No associated sinus or osseous pathology. Lacrimal glands appear normal. Compared with the prior CT and MR studies, the lesion has grown since 2017. Other: None. IMPRESSION: Atrophy and small vessel disease similar to priors. No acute intracranial findings. LEFT orbital inferolateral mass. This is either extraconal or associated with the muscle. This represents an unusual lesion, and the differential is wide. Considerations would include unusual thyroid orbitopathy, lymphoma, orbital pseudotumor, metastasis, sarcoidosis, neurofibroma, or meningioma. Recommend ophthalmologic evaluation. It is possible the lesion could be better characterized with sonography. Electronically Signed   By: Staci Righter M.D.   On: 06/26/2017  15:51       Discharge Exam: Vitals:   07/23/17 0243 07/23/17 0601  BP: (!) 157/61 (!) 145/67  Pulse: 71 68  Resp: 18 18  Temp: (!) 97.5 F (36.4 C) 98 F (36.7 C)  SpO2: 97% 98%   Vitals:   07/23/17 0130 07/23/17 0200 07/23/17 0243 07/23/17 0601  BP: (!) 148/64 (!) 125/54 (!) 157/61 (!) 145/67  Pulse: 74 76 71 68  Resp: 18 18 18 18   Temp:   (!) 97.5 F (36.4 C) 98 F (36.7 C)  TempSrc:   Oral Oral  SpO2: 98% 95% 97% 98%  Weight: 73.7 kg (162 lb 7.7 oz)  69.6 kg (153 lb 8 oz)   Height: 5\' 1"  (1.549 m)       General: Pt is alert, awake, not in acute distress Cardiovascular: RRR, S1/S2 +, no rubs, no gallops Respiratory: CTA bilaterally, no wheezing, no rhonchi Abdominal: Soft, NT, ND, bowel sounds + Extremities: no edema, no cyanosis    The results of significant diagnostics from this hospitalization (including imaging, microbiology, ancillary and laboratory) are listed below for reference.     Microbiology: No results found for this or any previous visit (from the past 240 hour(s)).   Labs: BNP (last 3 results)  Recent Labs  11/10/16 2346  BNP 407.6*   Basic Metabolic Panel:  Recent Labs Lab 07/22/17 2012 07/23/17 0152 07/23/17 0520  NA 139  --  140  K 6.1* 3.5 4.3  CL 106  --  104  CO2 19*  --  26  GLUCOSE 85  --  157*  BUN 37*  --  31*  CREATININE 1.59*  --  1.18*  CALCIUM 8.9  --  8.8*   Liver Function Tests: No results for input(s): AST, ALT, ALKPHOS, BILITOT, PROT, ALBUMIN in the last 168 hours. No results for input(s): LIPASE, AMYLASE in the last 168 hours. No results for input(s): AMMONIA in the last 168 hours. CBC:  Recent Labs Lab 07/22/17 2012  WBC 7.9  HGB 11.8*  HCT 37.8  MCV 87.9  PLT 230   Cardiac Enzymes: No results for input(s): CKTOTAL, CKMB, CKMBINDEX, TROPONINI in the last 168 hours. BNP: Invalid input(s): POCBNP CBG:  Recent Labs Lab 07/23/17 0045 07/23/17 0148  GLUCAP 105* 59*   D-Dimer No results for  input(s): DDIMER in the last 72 hours. Hgb A1c No results for input(s): HGBA1C in the last 72 hours. Lipid Profile No results for input(s): CHOL, HDL, LDLCALC, TRIG, CHOLHDL, LDLDIRECT in the last 72 hours. Thyroid function studies No results for input(s): TSH, T4TOTAL, T3FREE,  THYROIDAB in the last 72 hours.  Invalid input(s): FREET3 Anemia work up No results for input(s): VITAMINB12, FOLATE, FERRITIN, TIBC, IRON, RETICCTPCT in the last 72 hours. Urinalysis    Component Value Date/Time   COLORURINE STRAW (A) 07/22/2017 2329   APPEARANCEUR CLEAR 07/22/2017 2329   LABSPEC 1.009 07/22/2017 2329   PHURINE 6.0 07/22/2017 2329   GLUCOSEU 50 (A) 07/22/2017 2329   HGBUR NEGATIVE 07/22/2017 2329   BILIRUBINUR NEGATIVE 07/22/2017 2329   KETONESUR NEGATIVE 07/22/2017 2329   PROTEINUR 30 (A) 07/22/2017 2329   UROBILINOGEN 0.2 09/04/2014 1340   NITRITE NEGATIVE 07/22/2017 2329   LEUKOCYTESUR TRACE (A) 07/22/2017 2329   Sepsis Labs Invalid input(s): PROCALCITONIN,  WBC,  LACTICIDVEN Microbiology No results found for this or any previous visit (from the past 240 hour(s)).   Time coordinating discharge: Over 30 minutes  SIGNED:   Debbe Odea, MD  [facility] 07/23/2017, 10:10 AM Pager   If 7PM-7AM, please contact night-coverage [email or web address] Password [email or web address]

## 2017-07-23 NOTE — Discharge Instructions (Signed)
You must weight yourself daily and keep a record of it. Take 20 mg of Lasix and 20 mEQ of Potassium if you gain 3 or more pounds in 24 hrs.  See your PCP ion Monday for the following blood work> Bmet Hold Losartan for now.   Take these discharge papers with your to PCP's office.    Please take all your medications with you for your next visit with your Primary MD. Please request your Primary MD to go over all hospital test results at the follow up. Please ask your Primary MD to get all Hospital records sent to his/her office.  If you experience worsening of your admission symptoms, develop shortness of breath, chest pain, suicidal or homicidal thoughts or a life threatening emergency, you must seek medical attention immediately by calling 911 or calling your MD.  Dennis Bast must read the complete instructions/literature along with all the possible adverse reactions/side effects for all the medicines you take including new medications that have been prescribed to you. Take new medicines after you have completely understood and accpet all the possible adverse reactions/side effects.   Do not drive when taking pain medications or sedatives.    Do not take more than prescribed Pain, Sleep and Anxiety Medications  If you have smoked or chewed Tobacco in the last 2 yrs please stop. Stop any regular alcohol and or recreational drug use.  Wear Seat belts while driving.

## 2017-07-25 LAB — GLUCOSE, CAPILLARY: GLUCOSE-CAPILLARY: 102 mg/dL — AB (ref 65–99)

## 2017-07-26 DIAGNOSIS — R600 Localized edema: Secondary | ICD-10-CM | POA: Diagnosis not present

## 2017-07-26 DIAGNOSIS — I10 Essential (primary) hypertension: Secondary | ICD-10-CM | POA: Diagnosis not present

## 2017-07-26 DIAGNOSIS — E875 Hyperkalemia: Secondary | ICD-10-CM | POA: Diagnosis not present

## 2017-08-01 DIAGNOSIS — H903 Sensorineural hearing loss, bilateral: Secondary | ICD-10-CM | POA: Diagnosis not present

## 2017-08-10 ENCOUNTER — Encounter: Payer: Self-pay | Admitting: Vascular Surgery

## 2017-08-10 ENCOUNTER — Ambulatory Visit: Payer: Self-pay | Admitting: Vascular Surgery

## 2017-08-10 ENCOUNTER — Encounter (HOSPITAL_COMMUNITY): Payer: Self-pay

## 2017-08-10 ENCOUNTER — Ambulatory Visit (HOSPITAL_COMMUNITY)
Admission: RE | Admit: 2017-08-10 | Discharge: 2017-08-10 | Disposition: A | Discharge status: Home / Self Care | Payer: Medicare Other | Source: Ambulatory Visit | Attending: Vascular Surgery | Admitting: Vascular Surgery

## 2017-08-10 DIAGNOSIS — I6523 Occlusion and stenosis of bilateral carotid arteries: Secondary | ICD-10-CM | POA: Diagnosis not present

## 2017-08-10 DIAGNOSIS — Z48812 Encounter for surgical aftercare following surgery on the circulatory system: Secondary | ICD-10-CM | POA: Insufficient documentation

## 2017-08-10 LAB — VAS US CAROTID
LCCADDIAS: 6 cm/s
LEFT ECA DIAS: -20 cm/s
LEFT VERTEBRAL DIAS: 17 cm/s
LICADDIAS: -21 cm/s
LICADSYS: -97 cm/s
LICAPDIAS: -21 cm/s
LICAPSYS: -80 cm/s
Left CCA dist sys: 79 cm/s
Left CCA prox dias: -12 cm/s
Left CCA prox sys: -125 cm/s
RIGHT ECA DIAS: -10 cm/s
RIGHT VERTEBRAL DIAS: 11 cm/s
Right CCA prox dias: 17 cm/s
Right CCA prox sys: 70 cm/s
Right cca dist sys: -122 cm/s

## 2017-08-10 NOTE — Progress Notes (Signed)
*  PRELIMINARY RESULTS* Vascular Ultrasound Carotid Duplex (Doppler) has been completed.  Preliminary findings: Bilateral: 1-39% ICA stenosis. Antegrade vertebral flow.  Hx left endarterectomy.   Landry Mellow, RDMS, RVT  08/10/2017, 2:50 PM

## 2017-08-12 DIAGNOSIS — H35033 Hypertensive retinopathy, bilateral: Secondary | ICD-10-CM | POA: Diagnosis not present

## 2017-08-12 DIAGNOSIS — H353132 Nonexudative age-related macular degeneration, bilateral, intermediate dry stage: Secondary | ICD-10-CM | POA: Diagnosis not present

## 2017-08-12 DIAGNOSIS — Z961 Presence of intraocular lens: Secondary | ICD-10-CM | POA: Diagnosis not present

## 2017-08-12 DIAGNOSIS — H40013 Open angle with borderline findings, low risk, bilateral: Secondary | ICD-10-CM | POA: Diagnosis not present

## 2017-08-17 ENCOUNTER — Ambulatory Visit: Payer: Medicare Other | Admitting: Vascular Surgery

## 2017-08-23 ENCOUNTER — Other Ambulatory Visit: Payer: Self-pay | Admitting: Geriatric Medicine

## 2017-08-23 DIAGNOSIS — R1011 Right upper quadrant pain: Secondary | ICD-10-CM

## 2017-08-25 ENCOUNTER — Ambulatory Visit
Admission: RE | Admit: 2017-08-25 | Discharge: 2017-08-25 | Disposition: A | Discharge status: Home / Self Care | Payer: Medicare Other | Source: Ambulatory Visit | Attending: Geriatric Medicine | Admitting: Geriatric Medicine

## 2017-08-25 DIAGNOSIS — R1011 Right upper quadrant pain: Secondary | ICD-10-CM

## 2017-08-25 DIAGNOSIS — N281 Cyst of kidney, acquired: Secondary | ICD-10-CM | POA: Diagnosis not present

## 2017-08-25 MED ORDER — IOPAMIDOL (ISOVUE-300) INJECTION 61%
100.0000 mL | Freq: Once | INTRAVENOUS | Status: AC | PRN
  Administered 2017-08-25: 100 mL via INTRAVENOUS

## 2017-09-21 ENCOUNTER — Ambulatory Visit: Payer: Medicare Other | Admitting: Vascular Surgery

## 2017-10-05 ENCOUNTER — Ambulatory Visit: Payer: Medicare Other | Admitting: Vascular Surgery

## 2017-10-27 ENCOUNTER — Ambulatory Visit: Payer: Medicare Other | Admitting: Vascular Surgery

## 2018-09-06 DIAGNOSIS — H903 Sensorineural hearing loss, bilateral: Secondary | ICD-10-CM | POA: Diagnosis not present

## 2018-09-07 DIAGNOSIS — I509 Heart failure, unspecified: Secondary | ICD-10-CM | POA: Diagnosis not present

## 2018-09-07 DIAGNOSIS — E039 Hypothyroidism, unspecified: Secondary | ICD-10-CM | POA: Diagnosis not present

## 2018-09-07 DIAGNOSIS — R9089 Other abnormal findings on diagnostic imaging of central nervous system: Secondary | ICD-10-CM | POA: Diagnosis not present

## 2018-09-07 DIAGNOSIS — E8770 Fluid overload, unspecified: Secondary | ICD-10-CM | POA: Diagnosis not present

## 2018-09-07 DIAGNOSIS — G4700 Insomnia, unspecified: Secondary | ICD-10-CM | POA: Diagnosis not present

## 2018-09-07 DIAGNOSIS — E7800 Pure hypercholesterolemia, unspecified: Secondary | ICD-10-CM | POA: Diagnosis not present

## 2018-09-07 DIAGNOSIS — L84 Corns and callosities: Secondary | ICD-10-CM | POA: Diagnosis not present

## 2018-09-07 DIAGNOSIS — F419 Anxiety disorder, unspecified: Secondary | ICD-10-CM | POA: Diagnosis not present

## 2018-09-07 DIAGNOSIS — Z23 Encounter for immunization: Secondary | ICD-10-CM | POA: Diagnosis not present

## 2018-09-07 DIAGNOSIS — I10 Essential (primary) hypertension: Secondary | ICD-10-CM | POA: Diagnosis not present

## 2018-09-11 ENCOUNTER — Other Ambulatory Visit (HOSPITAL_COMMUNITY): Payer: Self-pay | Admitting: Internal Medicine

## 2018-09-11 DIAGNOSIS — E8770 Fluid overload, unspecified: Secondary | ICD-10-CM

## 2018-09-12 ENCOUNTER — Other Ambulatory Visit (HOSPITAL_COMMUNITY): Payer: Self-pay

## 2018-09-18 ENCOUNTER — Other Ambulatory Visit: Payer: Self-pay

## 2018-09-18 ENCOUNTER — Ambulatory Visit (HOSPITAL_COMMUNITY): Payer: Medicare Other | Attending: Internal Medicine

## 2018-09-18 DIAGNOSIS — I340 Nonrheumatic mitral (valve) insufficiency: Secondary | ICD-10-CM | POA: Insufficient documentation

## 2018-09-18 DIAGNOSIS — E785 Hyperlipidemia, unspecified: Secondary | ICD-10-CM | POA: Insufficient documentation

## 2018-09-18 DIAGNOSIS — I509 Heart failure, unspecified: Secondary | ICD-10-CM | POA: Diagnosis not present

## 2018-09-18 DIAGNOSIS — E8770 Fluid overload, unspecified: Secondary | ICD-10-CM | POA: Diagnosis not present

## 2018-09-18 DIAGNOSIS — I110 Hypertensive heart disease with heart failure: Secondary | ICD-10-CM | POA: Diagnosis not present

## 2018-09-21 ENCOUNTER — Ambulatory Visit (INDEPENDENT_AMBULATORY_CARE_PROVIDER_SITE_OTHER): Payer: Medicare Other | Admitting: Podiatry

## 2018-09-21 ENCOUNTER — Encounter: Payer: Self-pay | Admitting: Podiatry

## 2018-09-21 VITALS — BP 176/76 | HR 69

## 2018-09-21 DIAGNOSIS — L84 Corns and callosities: Secondary | ICD-10-CM

## 2018-09-21 DIAGNOSIS — M779 Enthesopathy, unspecified: Secondary | ICD-10-CM | POA: Diagnosis not present

## 2018-09-21 MED ORDER — TRIAMCINOLONE ACETONIDE 10 MG/ML IJ SUSP
10.0000 mg | Freq: Once | INTRAMUSCULAR | Status: AC
  Administered 2018-09-21: 10 mg

## 2018-09-22 NOTE — Progress Notes (Signed)
Subjective:   Patient ID: [name] [name], female   DOB: 82 y.o.   MRN: [id number]   HPI Patient states she is had a very painful lesion underneath her right bone that makes it hard to walk on it it feels like there is fluid in it and its been going on now for several months.  Patient does not remember injury and patient likes to be active and does not smoke   Review of Systems  All other systems reviewed and are negative.       Objective:  Physical Exam  Constitutional: She appears well-developed and well-nourished.  Cardiovascular: Intact distal pulses.  Pulmonary/Chest: Effort normal.  Musculoskeletal: Normal range of motion.  Neurological: She is alert.  Skin: Skin is warm.  Nursing note and vitals reviewed.   Neurovascular status was found to be intact muscle strength was adequate range of motion was within normal limits with patient found to have inflammation pain fifth metatarsal right with fluid buildup around the head of the bone and keratotic lesion that is formed over time.  Patient has good digital perfusion and is well oriented x3     Assessment:  Inflammatory capsulitis fifth MPJ right with fluid buildup noted around the joint and plantarflexed metatarsal     Plan:  H&P conditions reviewed and injected the fifth MPJ 3 mg dexamethasone Kenalog 5 mg Xylocaine and did deep debridement of lesion with no iatrogenic bleeding noted.  Patient tolerated procedure well seen back to recheck

## 2018-10-05 ENCOUNTER — Emergency Department (HOSPITAL_COMMUNITY)
Admission: EM | Admit: 2018-10-05 | Discharge: 2018-10-05 | Disposition: A | Discharge status: Home / Self Care | Payer: Medicare Other | Attending: Emergency Medicine | Admitting: Emergency Medicine

## 2018-10-05 ENCOUNTER — Other Ambulatory Visit: Payer: Self-pay

## 2018-10-05 ENCOUNTER — Emergency Department (HOSPITAL_COMMUNITY): Payer: Medicare Other

## 2018-10-05 DIAGNOSIS — S01112A Laceration without foreign body of left eyelid and periocular area, initial encounter: Secondary | ICD-10-CM | POA: Diagnosis not present

## 2018-10-05 DIAGNOSIS — S3993XA Unspecified injury of pelvis, initial encounter: Secondary | ICD-10-CM | POA: Diagnosis not present

## 2018-10-05 DIAGNOSIS — W1830XA Fall on same level, unspecified, initial encounter: Secondary | ICD-10-CM | POA: Insufficient documentation

## 2018-10-05 DIAGNOSIS — I10 Essential (primary) hypertension: Secondary | ICD-10-CM | POA: Insufficient documentation

## 2018-10-05 DIAGNOSIS — M545 Low back pain: Secondary | ICD-10-CM | POA: Diagnosis not present

## 2018-10-05 DIAGNOSIS — Y999 Unspecified external cause status: Secondary | ICD-10-CM | POA: Diagnosis not present

## 2018-10-05 DIAGNOSIS — R51 Headache: Secondary | ICD-10-CM | POA: Insufficient documentation

## 2018-10-05 DIAGNOSIS — Y939 Activity, unspecified: Secondary | ICD-10-CM | POA: Diagnosis not present

## 2018-10-05 DIAGNOSIS — W19XXXA Unspecified fall, initial encounter: Secondary | ICD-10-CM

## 2018-10-05 DIAGNOSIS — S0181XA Laceration without foreign body of other part of head, initial encounter: Secondary | ICD-10-CM

## 2018-10-05 DIAGNOSIS — S0083XA Contusion of other part of head, initial encounter: Secondary | ICD-10-CM

## 2018-10-05 DIAGNOSIS — Y92009 Unspecified place in unspecified non-institutional (private) residence as the place of occurrence of the external cause: Secondary | ICD-10-CM | POA: Diagnosis not present

## 2018-10-05 DIAGNOSIS — I5032 Chronic diastolic (congestive) heart failure: Secondary | ICD-10-CM | POA: Diagnosis not present

## 2018-10-05 DIAGNOSIS — E039 Hypothyroidism, unspecified: Secondary | ICD-10-CM | POA: Insufficient documentation

## 2018-10-05 DIAGNOSIS — R609 Edema, unspecified: Secondary | ICD-10-CM | POA: Diagnosis not present

## 2018-10-05 DIAGNOSIS — S01419A Laceration without foreign body of unspecified cheek and temporomandibular area, initial encounter: Secondary | ICD-10-CM | POA: Diagnosis not present

## 2018-10-05 DIAGNOSIS — Z79899 Other long term (current) drug therapy: Secondary | ICD-10-CM | POA: Insufficient documentation

## 2018-10-05 DIAGNOSIS — S199XXA Unspecified injury of neck, initial encounter: Secondary | ICD-10-CM | POA: Diagnosis not present

## 2018-10-05 DIAGNOSIS — R52 Pain, unspecified: Secondary | ICD-10-CM | POA: Diagnosis not present

## 2018-10-05 LAB — URINALYSIS, ROUTINE W REFLEX MICROSCOPIC
Bilirubin Urine: NEGATIVE
Glucose, UA: NEGATIVE mg/dL
Ketones, ur: NEGATIVE mg/dL
Leukocytes, UA: NEGATIVE
Nitrite: NEGATIVE
PROTEIN: 100 mg/dL — AB
Specific Gravity, Urine: 1.013 (ref 1.005–1.030)
pH: 5 (ref 5.0–8.0)

## 2018-10-05 LAB — CBC WITH DIFFERENTIAL/PLATELET
Abs Immature Granulocytes: 0.1 10*3/uL — ABNORMAL HIGH (ref 0.00–0.07)
BASOS PCT: 1 %
Basophils Absolute: 0.1 10*3/uL (ref 0.0–0.1)
EOS ABS: 0.3 10*3/uL (ref 0.0–0.5)
Eosinophils Relative: 3 %
HCT: 42.1 % (ref 36.0–46.0)
Hemoglobin: 13.5 g/dL (ref 12.0–15.0)
IMMATURE GRANULOCYTES: 1 %
Lymphocytes Relative: 12 %
Lymphs Abs: 1.2 10*3/uL (ref 0.7–4.0)
MCH: 29.8 pg (ref 26.0–34.0)
MCHC: 32.1 g/dL (ref 30.0–36.0)
MCV: 92.9 fL (ref 80.0–100.0)
MONO ABS: 0.7 10*3/uL (ref 0.1–1.0)
MONOS PCT: 7 %
NEUTROS PCT: 76 %
NRBC: 0 % (ref 0.0–0.2)
Neutro Abs: 7.5 10*3/uL (ref 1.7–7.7)
Platelets: 190 10*3/uL (ref 150–400)
RBC: 4.53 MIL/uL (ref 3.87–5.11)
RDW: 12.8 % (ref 11.5–15.5)
WBC: 9.9 10*3/uL (ref 4.0–10.5)

## 2018-10-05 LAB — COMPREHENSIVE METABOLIC PANEL
ALBUMIN: 3.4 g/dL — AB (ref 3.5–5.0)
ALK PHOS: 116 U/L (ref 38–126)
ALT: 20 U/L (ref 0–44)
AST: 21 U/L (ref 15–41)
Anion gap: 11 (ref 5–15)
BUN: 29 mg/dL — ABNORMAL HIGH (ref 8–23)
CALCIUM: 8.5 mg/dL — AB (ref 8.9–10.3)
CO2: 22 mmol/L (ref 22–32)
CREATININE: 1.12 mg/dL — AB (ref 0.44–1.00)
Chloride: 104 mmol/L (ref 98–111)
GFR calc Af Amer: 53 mL/min — ABNORMAL LOW (ref >60)
GFR calc non Af Amer: 45 mL/min — ABNORMAL LOW (ref >60)
GLUCOSE: 111 mg/dL — AB (ref 70–99)
Potassium: 3.5 mmol/L (ref 3.5–5.1)
SODIUM: 137 mmol/L (ref 135–145)
Total Bilirubin: 0.3 mg/dL (ref 0.3–1.2)
Total Protein: 6.8 g/dL (ref 6.5–8.1)

## 2018-10-05 LAB — PROTIME-INR
INR: 0.89
Prothrombin Time: 11.9 seconds (ref 11.4–15.2)

## 2018-10-05 MED ORDER — LIDOCAINE-EPINEPHRINE (PF) 2 %-1:200000 IJ SOLN
10.0000 mL | Freq: Once | INTRAMUSCULAR | Status: AC
  Administered 2018-10-05: 10 mL
  Filled 2018-10-05: qty 20

## 2018-10-05 NOTE — ED Provider Notes (Signed)
[facility] DEPT Provider Note   CSN: [id number] Arrival date & time: 10/05/18  0228     History   Chief Complaint Chief Complaint  Patient presents with  . Fall    HPI [name] [name] is a 82 y.o. female.  Patient to ED after fall at home causing facial injury. She states she tried to get up and walk without her walker and fell forward, hitting the floor causing laceration over her left eye. No LOC. The patient remembers the event in detail. She was able to get up and become ambulatory with the help of her husband. No symptoms of dizziness, nausea, pain prior to the fall. After the fall, she complains of facial pain and swelling without neck pain, low back pain, and pain along the left groin. No chest pain, SOB, pain with breathing or abdominal pain. She takes aspirin but no other anticoagulants.   The history is provided by the patient. No language interpreter was used.    Past Medical History:  Diagnosis Date  . Alopecia   . Anemia    hx of years ago   . Anxiety   . ARF (acute renal failure) ([place]) 08/23/2014  . Arthritis    psoriatic arthritis  . Back pain   . Blood transfusion    at age of 4  . Carotid stenosis 12/2016  . Chronic diastolic CHF (congestive heart failure) ([place])   . Depression   . Dysrhythmia 11/13/2016   PAFib for a short time- Echo done 11/14/16 PAF- to follow up with PCP- to see if she is a candiate for anticoags.  Not started at times time due to alcohol abuse.  [name] GERD (gastroesophageal reflux disease)   . H/O hiatal hernia   . History of acute cholangitis   . Hyperlipidemia   . Hypertension   . Hypothyroidism   . IBS (irritable bowel syndrome)   . Pancreatitis   . Peripheral vascular disease ([place])   . PONV (postoperative nausea and vomiting)    with Breast reduction- only time  . Rosacea   . Seizures ([place]) 11/13/2016   Thopught to be from withdrawl Benzodiazepine  . Stroke [facility])    patient denies-  . Thyroid  disease     Patient Active Problem List   Diagnosis Date Noted  . Hyperkalemia 07/23/2017  . AKI (acute kidney injury) ([place]) 07/23/2017  . 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 Fall 06/26/2017  . Intractable nausea and vomiting 03/11/2017  . Acute lower UTI 03/11/2017  . Carotid stenosis 12/28/2016  . Sedative, hypnotic, or anxiolytic withdrawal delirium 11/15/2016  . Paroxysmal atrial fibrillation (H[facility])   . Cerebrovascular disease   . Fever   . Acute encephalopathy 11/10/2016  . Hypothyroidism 11/10/2016  . Hypertensive urgency 11/10/2016  . Altered mental state 11/10/2016  . Seizure ([place]) 11/10/2016  . Chronic diastolic CHF (congestive heart failure) ([place])   . Left carotid stenosis 10/29/2016  . S/P total knee replacement 03/10/2016  . 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 Ileus ([place]) 09/05/2014  . Leukocytosis 09/05/2014  . Vomiting 09/04/2014  . Pulmonary edema 09/04/2014  . Hypokalemia 08/28/2014  . Enteritis due to Clostridium difficile 08/27/2014  . 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 Protein-calorie malnutrition, severe ([place]) 08/24/2014  . Obstructive jaundice 08/23/2014  . Acute cholangitis 08/23/2014  . ARF (acute renal failure) ([place]) 08/23/2014  . Coagulopathy ([place]) 08/23/2014  . Back pain   . Arthritis   .  Hypertension   . GERD (gastroesophageal reflux disease)   . Anxiety   . IBS (irritable bowel syndrome)   . Hernia of Right posterior flank 04/18/2012    Past Surgical History:  Procedure Laterality Date  . ABDOMINAL HYSTERECTOMY     partial  . BACK SURGERY  2010   thoractic-screws and rods  . BILE DUCT STENT PLACEMENT  08/26/2014  . BREAST REDUCTION SURGERY    . CHOLECYSTECTOMY  1988   lap  . COLONOSCOPY    . COLONOSCOPY Left 09/08/2014   Procedure: COLONOSCOPY;  Surgeon: Arta Silence, MD;  Location: [facility] ENDOSCOPY;  Service: Endoscopy;  Laterality: Left;  . ENDARTERECTOMY Left 12/28/2016  .  ENDARTERECTOMY Left 12/28/2016   Procedure: ENDARTERECTOMY CAROTID LEFT WITH XENOSURE BIOLOGIC PATCH ANGIOPLASTY;  Surgeon: Angelia Mould, MD;  Location: [facility];  Service: Vascular;  Laterality: Left;  . ERCP    . ERCP N/A 08/26/2014   Procedure: ENDOSCOPIC RETROGRADE CHOLANGIOPANCREATOGRAPHY (ERCP);  Surgeon: Jeryl Columbia, MD;  Location: [facility] ENDOSCOPY;  Service: Endoscopy;  Laterality: N/A;  . ERCP N/A 09/11/2014   Procedure: ENDOSCOPIC RETROGRADE CHOLANGIOPANCREATOGRAPHY (ERCP);  Surgeon: Arta Silence, MD;  Location: [facility] ENDOSCOPY;  Service: Endoscopy;  Laterality: N/A;  . EUS N/A 09/11/2014   Procedure: ESOPHAGEAL ENDOSCOPIC ULTRASOUND (EUS) RADIAL;  Surgeon: Arta Silence, MD;  Location: [facility] ENDOSCOPY;  Service: Endoscopy;  Laterality: N/A;  . EYE SURGERY Bilateral    Cataract  . JOINT REPLACEMENT  2005   left shoulder replacement   . ORIF CLAVICULAR FRACTURE Left 07/18/2014   Procedure: OPEN REDUCTION INTERNAL FIXATION (ORIF) LEFT CLAVICULAR FRACTURE;  Surgeon: Ninetta Lights, MD;  Location: [place];  Service: Orthopedics;  Laterality: Left;  . SPYGLASS CHOLANGIOSCOPY N/A 09/11/2014   Procedure: SPYGLASS CHOLANGIOSCOPY;  Surgeon: Arta Silence, MD;  Location: [facility] ENDOSCOPY;  Service: Endoscopy;  Laterality: N/A;  . TONSILLECTOMY  as child  . TOTAL KNEE ARTHROPLASTY Left 03/10/2016   Procedure: LEFT TOTAL KNEE ARTHROPLASTY;  Surgeon: Ninetta Lights, MD;  Location: [facility];  Service: Orthopedics;  Laterality: Left;  [name] VENTRAL HERNIA REPAIR  06/08/2012   Procedure: LAPAROSCOPIC VENTRAL HERNIA;  Surgeon: Adin Hector, MD;  Location: [facility] ORS;  Service: General;  Laterality: Right;     OB History   None      Home Medications    Prior to Admission medications   Medication Sig Start Date End Date Taking? Authorizing Provider  acetaminophen (TYLENOL) 325 MG tablet Take 2 tablets (650 mg total) by mouth every 6 (six) hours as needed for mild pain (or Fever >/= 101).  06/30/17   Rai, Ripudeep K, MD  amLODipine (NORVASC) 10 MG tablet Take 1 tablet (10 mg total) by mouth daily. 07/02/17   Rai, Vernelle Emerald, MD  aspirin EC 81 MG tablet Take 81 mg by mouth daily.    [provider]  aspirin-acetaminophen-caffeine (EXCEDRIN MIGRAINE) [phone] MG tablet Take 1-2 tablets by mouth every 6 (six) hours as needed for headache.    [provider]  atenolol (TENORMIN) 50 MG tablet Take 1 tablet (50 mg total) by mouth daily. 03/16/17   Velvet Bathe, MD  chlordiazePOXIDE (LIBRIUM) 5 MG capsule Take 5 mg by mouth 2 (two) times daily as needed for anxiety. Take 5 mg three times a day. On 07/26/2017 start taking 5 mg two times a day. 07/16/17   [provider]  clonazePAM (KLONOPIN) 1 MG tablet Take 1 mg by mouth at bedtime.  07/16/17   [provider]  esomeprazole (NEXIUM) 40 MG capsule Take 40 mg by mouth daily. 08/31/16   [provider]  furosemide (LASIX) 20 MG tablet Take 1 tablet (20 mg total) by mouth daily as needed. For > 3 lb weight gain in 24 hours. 07/23/17   Debbe Odea, MD  gabapentin (NEURONTIN) 400 MG capsule Take 400 mg by mouth 3 (three) times daily as needed (for nerve pain).     [provider]  hyoscyamine (LEVBID) 0.375 MG 12 hr tablet Take 0.375 mg by mouth 2 (two) times daily as needed for cramping.  04/08/17   [provider]  levothyroxine (SYNTHROID, LEVOTHROID) 75 MCG tablet Take 75 mcg by mouth daily before breakfast.  08/28/16   [provider]  ondansetron (ZOFRAN) 8 MG tablet Take 8 mg by mouth every 8 (eight) hours as needed for nausea or vomiting.    [provider]  potassium chloride SA (K-DUR,KLOR-CON) 20 MEQ tablet Take 1 tablet (20 mEq total) by mouth daily as needed. Take only when taking Lasix. 07/23/17   Debbe Odea, MD  QUEtiapine (SEROQUEL) 25 MG tablet Take 1 tablet (25 mg total) by mouth at bedtime. 11/18/16   Regalado, Belkys A, MD  simvastatin (ZOCOR) 10 MG  tablet Take 10 mg by mouth at bedtime. 08/09/16   [provider]  thiamine 100 MG tablet Take 1 tablet (100 mg total) by mouth daily. 11/19/16   Regalado, Belkys A, MD  venlafaxine XR (EFFEXOR-XR) 150 MG 24 hr capsule Take 150 mg by mouth daily. 02/18/17   [provider]  zolpidem (AMBIEN) 10 MG tablet Take 10 mg by mouth at bedtime. 06/23/17   [provider]    Family History Family History  Problem Relation Age of Onset  . Heart disease Mother   . Cancer Father        lung    Social History Social History   Tobacco Use  . Smoking status: Never Smoker  . Smokeless tobacco: Never Used  Substance Use Topics  . Alcohol use: No    Alcohol/week: 1.0 standard drinks    Types: 1 Glasses of wine per week    Comment: rare  . Drug use: No    Types: Benzodiazepines     Allergies   Tetanus toxoids and Yellow dyes (non-tartrazine)   Review of Systems Review of Systems  Constitutional: Negative for diaphoresis.  HENT: Positive for facial swelling. Negative for nosebleeds.   Eyes: Negative for visual disturbance.  Respiratory: Negative.  Negative for shortness of breath.   Cardiovascular: Negative.  Negative for chest pain.  Gastrointestinal: Negative.  Negative for abdominal pain and nausea.  Genitourinary: Negative.  Negative for dysuria.  Musculoskeletal: Positive for back pain. Negative for neck pain.  Skin: Positive for wound.  Neurological: Negative.  Negative for syncope, weakness and numbness.     Physical Exam Updated Vital Signs BP 140/90 (BP Location: Right Arm)   Pulse 65   Temp 98.1 F (36.7 C) (Oral)   Resp 16   Ht 5\' 1"  (1.549 m)   SpO2 99%   BMI 29.00 kg/m   Physical Exam  Constitutional: She is oriented to person, place, and time. She appears well-developed and well-nourished.  HENT:  Head: Normocephalic.  Large hematoma over left cheek with ecchymosis extending from supraorbital region. 3 cm linear laceration across lateral  left eyebrow. No scalp wound. No epistaxis. No dental injury or malocclusion.  Eyes: Pupils are equal, round, and reactive to light.  Full pain free range of motion of eyes.   Neck: Normal range of motion. Neck supple.  Cardiovascular: Normal rate and regular rhythm.  Pulmonary/Chest: Effort normal and breath sounds normal. She has no wheezes. She has no rales. She exhibits no tenderness.  Abdominal: Soft. Bowel sounds are normal. There is no tenderness. There is no rebound and no guarding.  Musculoskeletal: Normal range of motion. She exhibits no deformity.  FROM all extremities without limitation of range or strength. No tenderness of bony pelvis or hips. There is midline lumbar tenderness. No wound, abrasion or bruising visualized on the back.   Neurological: She is alert and oriented to person, place, and time.  Skin: Skin is warm and dry. No rash noted.  Psychiatric: She has a normal mood and affect.     ED Treatments / Results  Labs (all labs ordered are listed, but only abnormal results are displayed) Labs Reviewed  CBC WITH DIFFERENTIAL/PLATELET - Abnormal; Notable for the following components:      Result Value   Abs Immature Granulocytes 0.10 (*)    All other components within normal limits  COMPREHENSIVE METABOLIC PANEL - Abnormal; Notable for the following components:   Glucose, Bld 111 (*)    BUN 29 (*)    Creatinine, Ser 1.12 (*)    Calcium 8.5 (*)    Albumin 3.4 (*)    GFR calc non Af Amer 45 (*)    GFR calc Af Amer 53 (*)    All other components within normal limits  PROTIME-INR  URINALYSIS, ROUTINE W REFLEX MICROSCOPIC    EKG None  Radiology Dg Lumbar Spine Complete  Result Date: 10/05/2018 CLINICAL DATA:  Fall twice tonight.  Lumbosacral back pain. EXAM: LUMBAR SPINE - COMPLETE 4+ VIEW COMPARISON:  Abdominal CT 08/25/2017, CT 08/23/2014, 02/28/2012. No dedicated lumbar spine imaging. FINDINGS: Posterior rod and intrapedicular screw fusion of the included  lower thoracic and lumbar spine. Additional anterior fusion at L5-S1 and lateral fusion at L3-L4. no hardware fracture. Interbody spacers throughout. Spacer at L3-L4 may be displaced anteriorly, however unchanged from prior CT dating back to 2013 and chronic. No acute fracture. Sacroiliac joints are congruent. IMPRESSION: 1. Diffuse spinal fusion hardware, unchanged from prior exams. 2. No acute fracture. Electronically Signed   By: Keith Rake M.D.   On: 10/05/2018 03:59   Dg Pelvis 1-2 Views  Result Date: 10/05/2018 CLINICAL DATA:  Fall twice tonight.  Lumbosacral back pain. EXAM: PELVIS - 1-2 VIEW COMPARISON:  None. FINDINGS: The cortical margins of the bony pelvis are intact. No fracture. Pubic symphysis and sacroiliac joints are congruent. Both femoral heads are well-seated in the respective acetabula. Degenerative change of both hips, right greater than left. Tacks from prior hernia repair in the right abdomen. IMPRESSION: No pelvic fracture. Electronically Signed   By: Keith Rake M.D.   On: 10/05/2018 03:44   Ct Head Wo Contrast  Result Date: 10/05/2018 CLINICAL DATA:  [place] at home, facial laceration. History of hypertension, stroke and seizures. Status post LEFT carotid endarterectomy. EXAM: CT HEAD WITHOUT CONTRAST CT MAXILLOFACIAL WITHOUT CONTRAST CT CERVICAL SPINE WITHOUT CONTRAST TECHNIQUE: Multidetector CT imaging of the head, cervical spine, and maxillofacial structures were performed using the standard protocol without intravenous contrast. Multiplanar CT image reconstructions of the cervical spine and maxillofacial structures were also generated. COMPARISON:  MRI of the head June 26, 2017 FINDINGS: CT HEAD FINDINGS BRAIN: The ventricles and sulci are normal. No intraparenchymal hemorrhage, mass effect nor midline shift. No acute  large vascular territory infarcts. New nonacute appearing bilateral basal ganglia infarcts. Patchy supratentorial white matter hypodensities. No abnormal  extra-axial fluid collections. Basal cisterns are patent. VASCULAR: Mild calcific atherosclerosis carotid siphons SKULL/SOFT TISSUES: No skull fracture. No significant soft tissue swelling. OTHER: None. CT MAXILLOFACIAL FINDINGS OSSEOUS: No acute facial fracture. The mandible is intact, the condyles are located. No destructive bony lesions. ORBITS: Ocular globes and orbital contents are non suspicious. Status post bilateral ocular lens implants. SINUSES: Paranasal sinuses are well aerated. Intact nasal septum is midline. Mastoid aircells are well aerated. SOFT TISSUES: LEFT premalar and periorbital soft tissue swelling with minimal subcutaneous gas, no radiopaque foreign bodies. CT CERVICAL SPINE FINDINGS ALIGNMENT: Maintenance of cervical lordosis. No malalignment. SKULL BASE AND VERTEBRAE: Cervical vertebral bodies and posterior elements are intact. Moderate C5-C6 and C6-7 disc height loss with endplate sclerosis and marginal spurring compatible with degenerative discs. Severe LEFT upper cervical facet arthropathy. No destructive bony lesions. C1-2 articulation maintained with severe arthropathy and undersurface spurring SOFT TISSUES AND SPINAL CANAL: Nonacute. Mild calcific atherosclerosis carotid siphon DISC LEVELS: No significant osseous canal stenosis. Severe LEFT C3-4 and RIGHT C6-7 neural foraminal narrowing. UPPER CHEST: Lung apices are clear. OTHER: LEFT shoulder arthroplasty.  LEFT clavicle ORIF. IMPRESSION: CT HEAD: 1. No acute intracranial process. 2. New nonacute appearing basal ganglia lacunar infarcts. Mild chronic small vessel ischemic changes. CT MAXILLOFACIAL: 1. LEFT periorbital and LEFT face soft tissue swelling/contusion without postseptal involvement. 2. No facial fracture. CT CERVICAL SPINE: 1. No fracture or malalignment. 2. Severe C3-4 and C6-7 neural foraminal narrowing. Electronically Signed   By: Elon Alas M.D.   On: 10/05/2018 04:20   Ct Cervical Spine Wo Contrast  Result  Date: 10/05/2018 CLINICAL DATA:  [place] at home, facial laceration. History of hypertension, stroke and seizures. Status post LEFT carotid endarterectomy. EXAM: CT HEAD WITHOUT CONTRAST CT MAXILLOFACIAL WITHOUT CONTRAST CT CERVICAL SPINE WITHOUT CONTRAST TECHNIQUE: Multidetector CT imaging of the head, cervical spine, and maxillofacial structures were performed using the standard protocol without intravenous contrast. Multiplanar CT image reconstructions of the cervical spine and maxillofacial structures were also generated. COMPARISON:  MRI of the head June 26, 2017 FINDINGS: CT HEAD FINDINGS BRAIN: The ventricles and sulci are normal. No intraparenchymal hemorrhage, mass effect nor midline shift. No acute large vascular territory infarcts. New nonacute appearing bilateral basal ganglia infarcts. Patchy supratentorial white matter hypodensities. No abnormal extra-axial fluid collections. Basal cisterns are patent. VASCULAR: Mild calcific atherosclerosis carotid siphons SKULL/SOFT TISSUES: No skull fracture. No significant soft tissue swelling. OTHER: None. CT MAXILLOFACIAL FINDINGS OSSEOUS: No acute facial fracture. The mandible is intact, the condyles are located. No destructive bony lesions. ORBITS: Ocular globes and orbital contents are non suspicious. Status post bilateral ocular lens implants. SINUSES: Paranasal sinuses are well aerated. Intact nasal septum is midline. Mastoid aircells are well aerated. SOFT TISSUES: LEFT premalar and periorbital soft tissue swelling with minimal subcutaneous gas, no radiopaque foreign bodies. CT CERVICAL SPINE FINDINGS ALIGNMENT: Maintenance of cervical lordosis. No malalignment. SKULL BASE AND VERTEBRAE: Cervical vertebral bodies and posterior elements are intact. Moderate C5-C6 and C6-7 disc height loss with endplate sclerosis and marginal spurring compatible with degenerative discs. Severe LEFT upper cervical facet arthropathy. No destructive bony lesions. C1-2 articulation  maintained with severe arthropathy and undersurface spurring SOFT TISSUES AND SPINAL CANAL: Nonacute. Mild calcific atherosclerosis carotid siphon DISC LEVELS: No significant osseous canal stenosis. Severe LEFT C3-4 and RIGHT C6-7 neural foraminal narrowing. UPPER CHEST: Lung apices are clear. OTHER: LEFT shoulder arthroplasty.  LEFT clavicle ORIF. IMPRESSION: CT HEAD: 1. No acute intracranial process. 2. New nonacute appearing basal ganglia lacunar infarcts. Mild chronic small vessel ischemic changes. CT MAXILLOFACIAL: 1. LEFT periorbital and LEFT face soft tissue swelling/contusion without postseptal involvement. 2. No facial fracture. CT CERVICAL SPINE: 1. No fracture or malalignment. 2. Severe C3-4 and C6-7 neural foraminal narrowing. Electronically Signed   By: Elon Alas M.D.   On: 10/05/2018 04:20   Ct Maxillofacial Wo Cm  Result Date: 10/05/2018 CLINICAL DATA:  [place] at home, facial laceration. History of hypertension, stroke and seizures. Status post LEFT carotid endarterectomy. EXAM: CT HEAD WITHOUT CONTRAST CT MAXILLOFACIAL WITHOUT CONTRAST CT CERVICAL SPINE WITHOUT CONTRAST TECHNIQUE: Multidetector CT imaging of the head, cervical spine, and maxillofacial structures were performed using the standard protocol without intravenous contrast. Multiplanar CT image reconstructions of the cervical spine and maxillofacial structures were also generated. COMPARISON:  MRI of the head June 26, 2017 FINDINGS: CT HEAD FINDINGS BRAIN: The ventricles and sulci are normal. No intraparenchymal hemorrhage, mass effect nor midline shift. No acute large vascular territory infarcts. New nonacute appearing bilateral basal ganglia infarcts. Patchy supratentorial white matter hypodensities. No abnormal extra-axial fluid collections. Basal cisterns are patent. VASCULAR: Mild calcific atherosclerosis carotid siphons SKULL/SOFT TISSUES: No skull fracture. No significant soft tissue swelling. OTHER: None. CT MAXILLOFACIAL  FINDINGS OSSEOUS: No acute facial fracture. The mandible is intact, the condyles are located. No destructive bony lesions. ORBITS: Ocular globes and orbital contents are non suspicious. Status post bilateral ocular lens implants. SINUSES: Paranasal sinuses are well aerated. Intact nasal septum is midline. Mastoid aircells are well aerated. SOFT TISSUES: LEFT premalar and periorbital soft tissue swelling with minimal subcutaneous gas, no radiopaque foreign bodies. CT CERVICAL SPINE FINDINGS ALIGNMENT: Maintenance of cervical lordosis. No malalignment. SKULL BASE AND VERTEBRAE: Cervical vertebral bodies and posterior elements are intact. Moderate C5-C6 and C6-7 disc height loss with endplate sclerosis and marginal spurring compatible with degenerative discs. Severe LEFT upper cervical facet arthropathy. No destructive bony lesions. C1-2 articulation maintained with severe arthropathy and undersurface spurring SOFT TISSUES AND SPINAL CANAL: Nonacute. Mild calcific atherosclerosis carotid siphon DISC LEVELS: No significant osseous canal stenosis. Severe LEFT C3-4 and RIGHT C6-7 neural foraminal narrowing. UPPER CHEST: Lung apices are clear. OTHER: LEFT shoulder arthroplasty.  LEFT clavicle ORIF. IMPRESSION: CT HEAD: 1. No acute intracranial process. 2. New nonacute appearing basal ganglia lacunar infarcts. Mild chronic small vessel ischemic changes. CT MAXILLOFACIAL: 1. LEFT periorbital and LEFT face soft tissue swelling/contusion without postseptal involvement. 2. No facial fracture. CT CERVICAL SPINE: 1. No fracture or malalignment. 2. Severe C3-4 and C6-7 neural foraminal narrowing. Electronically Signed   By: Elon Alas M.D.   On: 10/05/2018 04:20    Procedures .[name] KitchenLaceration Repair Date/Time: 10/05/2018 7:05 AM Performed by: Charlann Lange, PA-C Authorized by: Charlann Lange, PA-C   Consent:    Consent obtained:  Verbal Anesthesia (see MAR for exact dosages):    Anesthesia method:  Local  infiltration   Local anesthetic:  Lidocaine 2% WITH epi Laceration details:    Location:  Face   Face location:  L eyebrow   Length (cm):  3 Repair type:    Repair type:  Simple Pre-procedure details:    Preparation:  Patient was prepped and draped in usual sterile fashion Exploration:    Hemostasis achieved with:  Direct pressure   Wound exploration: entire depth of wound probed and visualized     Contaminated: no   Treatment:    Area cleansed with:  Betadine and saline   Amount of cleaning:  Standard Skin repair:    Repair method:  Sutures   Suture size:  6-0   Suture material:  Prolene   Suture technique:  Running   Number of sutures:  9 Approximation:    Approximation:  Close Post-procedure details:    Dressing:  Antibiotic ointment   Patient tolerance of procedure:  Tolerated well, no immediate complications   (including critical care time)  Medications Ordered in ED Medications  lidocaine-EPINEPHrine (XYLOCAINE W/EPI) 2 %-1:200000 (PF) injection 10 mL (10 mLs Infiltration Given 10/05/18 0517)     Initial Impression / Assessment and Plan / ED Course  I have reviewed the triage vital signs and the nursing notes.  Pertinent labs & imaging results that were available during my care of the patient were reviewed by me and considered in my medical decision making (see chart for details).     Patient to ED after mechanical fall after attempting to walk without her walker. She fell forward, hitting the floor with pain, swelling and laceration to the left face and complaint of back pain.   Imaging is negative for acute fracture or evidence of intracranial head injury. Facial laceration repaired as per above note.   Patient has been seen and evaluated by Dr. Dina Rich. Will ambulate with walker to assess return to baseline stability. She is otherwise felt appropriate for discharge home.   Final Clinical Impressions(s) / ED Diagnoses   Final diagnoses:  None   1. Fall,  mechanical 2. Facial laceration 3. Facial contusion  ED Discharge Orders    None       Charlann Lange, PA-C 10/07/18 2245    Dina Rich Barbette Hair, MD 10/09/18 [phone]

## 2018-10-05 NOTE — ED Notes (Signed)
Patient scalp cleaned of dried blood using normal saline, patient tolerated well

## 2018-10-05 NOTE — ED Provider Notes (Signed)
Patient care transferred at shift change from [facility], [place].  In summation 82 year old female who presents after evaluation of mechanical fall at home. No LOC, no anticoagulation. Denies dizziness, vision changes, lightheadedness, neck pain, low back pain, radiation of pain down legs, extremity weakness or numbness/tingling. Laceration to left eyebrow with ecchymosis to left face. Labs and imaging resulted with negative findings at care transfer.  Patient to dc home if able to ambulate without difficulty. Physical Exam  BP (!) 155/71   Pulse 66   Temp 98.1 F (36.7 C) (Oral)   Resp 16   Ht 5\' 1"  (1.549 m)   SpO2 100%   BMI 29.00 kg/m   Physical Exam  Constitutional: She appears well-developed and well-nourished. No distress.  HENT:  Head: Atraumatic.  Large hematoma over left cheek with ecchymosis extending from supraorbital region. 3 cm linear laceration across lateral left eyebrow, closed with Prolene. No scalp wound. No epistaxis. No dental injury or malocclusion.   Eyes: Pupils are equal, round, and reactive to light.  EOM without difficulty or pain  Neck: Normal range of motion.  Cardiovascular: Normal rate and intact distal pulses.  Pulmonary/Chest: No respiratory distress.  Abdominal: She exhibits no distension.  Musculoskeletal: Normal range of motion.  Able to ambulate in hallway with nursing staff with walker without difficulty, dizziness or lightheadedness.  Neurological: She is alert.  Skin: Skin is warm and dry.  Psychiatric: She has a normal mood and affect.  Nursing note and vitals reviewed.   ED Course/Procedures     Procedures  MDM  82 year old who presents after mechanical fall. Care transferred at shift change from [facility], [place]. See note. Laceration closed with Prolene by [place] PA-C. Able to ambulate in hall with walker (uses at baseline) without pain, ataxia or lightheaded or dizziness. Imaging and labs negative. Will dc home with husband  for close outpatient follow-up with suture removal in 4 days. Discussed reasons to return to the ED. Voice understanding and are agreeable for follow-up.     Annjanette Wertenberger A, PA-C 10/05/18 0745    Milton Ferguson, MD 10/07/18 2111

## 2018-10-05 NOTE — Discharge Instructions (Signed)
You x-rays are all negative so no fractures, including lower back, pelvic, face and neck, and no internal head injury. You can be discharged home and should call your doctor to schedule an appointment for recheck and suture removal in 4 days. Use your walker each and every time you attempt to walk. Return to the ED with any worsening or new symptoms. Take Tylenol for pain every 4 hours as needed.

## 2018-10-05 NOTE — ED Triage Notes (Signed)
Patient had unwitnessed fall at home, approximately 1 inch laceration to left eyebrow and bruising noted to left side of face. Patient c/o pain to groin when she sits up, no shortening or rotation of legs noted, patient was ambulatory

## 2019-02-21 ENCOUNTER — Other Ambulatory Visit: Payer: Self-pay

## 2019-02-21 ENCOUNTER — Ambulatory Visit (INDEPENDENT_AMBULATORY_CARE_PROVIDER_SITE_OTHER): Payer: Medicare Other

## 2019-02-21 ENCOUNTER — Ambulatory Visit (INDEPENDENT_AMBULATORY_CARE_PROVIDER_SITE_OTHER): Payer: Medicare Other | Admitting: Podiatry

## 2019-02-21 ENCOUNTER — Encounter: Payer: Self-pay | Admitting: Podiatry

## 2019-02-21 DIAGNOSIS — M79675 Pain in left toe(s): Secondary | ICD-10-CM

## 2019-02-21 DIAGNOSIS — L97512 Non-pressure chronic ulcer of other part of right foot with fat layer exposed: Secondary | ICD-10-CM | POA: Diagnosis not present

## 2019-02-21 DIAGNOSIS — M79674 Pain in right toe(s): Secondary | ICD-10-CM | POA: Diagnosis not present

## 2019-02-21 DIAGNOSIS — B351 Tinea unguium: Secondary | ICD-10-CM

## 2019-02-21 NOTE — Patient Instructions (Signed)

## 2019-02-22 ENCOUNTER — Telehealth: Payer: Self-pay | Admitting: *Deleted

## 2019-02-22 NOTE — Telephone Encounter (Signed)
Dr, Elisha Ponder ordered Iodosorb, conforming roll gauze, and 2x2 gauze and 1 inch tape dressing daily to right foot 1st submet L97.512 measuring 1.5 x 2.0 x 2.0cm with no exudate from [facility], and gloves saline, cotton tip applicator. Faxed required form and demographics to [facility].

## 2019-03-03 NOTE — Progress Notes (Signed)
Subjective: Patient presents today with diabetes and cc of painful, discolored, thick toenails which interfere with daily activities. Pain is aggravated when wearing enclosed shoe gear. Pain is getting progressively worse and relieved with periodic professional debridement.  [name], MD is her PCP.    Current Outpatient Medications:  .  acetaminophen (TYLENOL) 325 MG tablet, Take 2 tablets (650 mg total) by mouth every 6 (six) hours as needed for mild pain (or Fever >/= 101)., Disp: , Rfl:  .  amLODipine (NORVASC) 10 MG tablet, Take 1 tablet (10 mg total) by mouth daily., Disp: , Rfl:  .  aspirin EC 81 MG tablet, Take 81 mg by mouth daily., Disp: , Rfl:  .  aspirin-acetaminophen-caffeine (EXCEDRIN MIGRAINE) 250-250-65 MG tablet, Take 1-2 tablets by mouth every 6 (six) hours as needed for headache., Disp: , Rfl:  .  atenolol (TENORMIN) 50 MG tablet, Take 1 tablet (50 mg total) by mouth daily., Disp: 30 tablet, Rfl: 0 .  chlordiazePOXIDE (LIBRIUM) 5 MG capsule, Take 5 mg by mouth 2 (two) times daily as needed for anxiety. Take 5 mg three times a day. On 07/26/2017 start taking 5 mg two times a day., Disp: , Rfl:  .  clonazePAM (KLONOPIN) 1 MG tablet, Take 1 mg by mouth at bedtime. , Disp: , Rfl:  .  esomeprazole (NEXIUM) 40 MG capsule, Take 40 mg by mouth daily., Disp: , Rfl:  .  furosemide (LASIX) 20 MG tablet, Take 1 tablet (20 mg total) by mouth daily as needed. For > 3 lb weight gain in 24 hours., Disp: 30 tablet, Rfl: 0 .  gabapentin (NEURONTIN) 400 MG capsule, Take 400 mg by mouth 3 (three) times daily as needed (for nerve pain). , Disp: , Rfl:  .  hyoscyamine (LEVBID) 0.375 MG 12 hr tablet, Take 0.375 mg by mouth 2 (two) times daily as needed for cramping. , Disp: , Rfl:  .  levothyroxine (SYNTHROID, LEVOTHROID) 75 MCG tablet, Take 75 mcg by mouth daily before breakfast. , Disp: , Rfl:  .  ondansetron (ZOFRAN) 8 MG tablet, Take 8 mg by mouth every 8 (eight) hours as needed for nausea  or vomiting., Disp: , Rfl:  .  potassium chloride SA (K-DUR,KLOR-CON) 20 MEQ tablet, Take 1 tablet (20 mEq total) by mouth daily as needed. Take only when taking Lasix., Disp: , Rfl:  .  QUEtiapine (SEROQUEL) 25 MG tablet, Take 1 tablet (25 mg total) by mouth at bedtime., Disp: 30 tablet, Rfl: 0 .  simvastatin (ZOCOR) 10 MG tablet, Take 10 mg by mouth at bedtime., Disp: , Rfl:  .  thiamine 100 MG tablet, Take 1 tablet (100 mg total) by mouth daily., Disp: 30 tablet, Rfl: 0 .  venlafaxine XR (EFFEXOR-XR) 150 MG 24 hr capsule, Take 150 mg by mouth daily., Disp: , Rfl:  .  zolpidem (AMBIEN) 10 MG tablet, Take 10 mg by mouth at bedtime., Disp: , Rfl:    Allergies  Allergen Reactions  . Tetanus Toxoids Swelling and Other (See Comments)    Arm (site) became swollen as a child  . Yellow Dyes (Non-Tartrazine) Itching and Other (See Comments)    Makes patient nervous      Objective:  Vascular Examination: Capillary refill time <3 seconds  x 10 digits.  Dorsalis pedis pulses present b/l.  Posterior tibial pulses diminished b/l.  Digital hair absent x 10 digits.  Skin temperature gradient WNL b/l.  Dermatological Examination: Skin with normal turgor, texture and tone b/l.  Toenails  1-5 b/l discolored, thick, dystrophic with subungual debris and pain with palpation to nailbeds due to thickness of nails.  Ulceration located submet head 1 right foot.  Predebridement measurements carried out today of 1.5 x 2.0 cm. No redness, streaking or lymphangitis noted.  No probing to bone, no undermining, no active pus purulence noted.  No deep abscess, no erythema, no edema, no cellulitis, no odor was encountered.    Postdebridement submetatarsal head 1 ulcer right foot measurements today are: 1.5 x 2.0 x 0.2 cm to level of subcutaneous tissue. +Pink, granular base .  No redness, streaking or lymphangitis noted.  No probing to bone, no undermining, no active pus or purulence noted.  No deep abscess, no  erythema, no edema, no cellulitis, no odor was encountered.    Musculoskeletal: Muscle strength 5/5 to all LE muscle groups  Hammertoes 1-5 b/l.  Neurological: Sensation intact with 10 gram monofilament.  Vibratory sensation intact.  Xrays right foot: negative for cortical disruption of first metatarsal head. + Plantar/posterior calcaneal spurs present. Mild osteopenia noted right foot.  Assessment: 1. Painful onychomycosis toenails 1-5 b/l 2. Ulcer submet head 1 right foot  Plan: 1. Toenails 1-5 b/l were debrided in length and girth without iatrogenic bleeding. 2. Ulcer submet head 1 right foot was debrided and reactive hyperkeratoses and necrotic tissue was resected to the level of bleeding or viable tissue. Ulcer was cleansed with wound cleanser. Iodosorb Gel was applied to base of wound with light dressing. 3. Xray was  performed right foot and reviewed with patient. 4. Surgical shoe was dispensed for right foot. 5. Patient was given instructions on offloading and dressing change/aftercare and was instructed to call immediately if any signs or symptoms of infection arise.  6. Faxed required form and demographics to [facility] for Iodosorb, conforming roll gauze, and 2x2 gauze and 1 inch tape  and gloves saline, cotton tip applicator.  7. Patient instructed to report to emergency department with worsening appearance of ulcer/toe/foot, increased pain, foul odor, increased redness, swelling, drainage, fever, chills, nightsweats, nausea, vomiting, increased blood sugar.  8. Patient/POA related understanding. 9. Follow up 2 weeks with Dr. March Rummage for ulcer follow up right foot. Follow up 10 weeks with me for routine foot care. 10. Patient/POA to call should there be a concern in the interim.

## 2019-03-08 ENCOUNTER — Ambulatory Visit: Payer: Medicare Other | Admitting: Podiatry

## 2019-03-09 ENCOUNTER — Emergency Department (HOSPITAL_COMMUNITY): Payer: Medicare Other

## 2019-03-09 ENCOUNTER — Other Ambulatory Visit: Payer: Self-pay

## 2019-03-09 ENCOUNTER — Inpatient Hospital Stay (HOSPITAL_COMMUNITY)
Admission: EM | Admit: 2019-03-09 | Discharge: 2019-03-13 | DRG: 922 | Disposition: A | Discharge status: Rehabilitation Facility | Payer: Medicare Other | Attending: Internal Medicine | Admitting: Internal Medicine

## 2019-03-09 ENCOUNTER — Encounter (HOSPITAL_COMMUNITY): Payer: Self-pay | Admitting: Emergency Medicine

## 2019-03-09 DIAGNOSIS — Z9049 Acquired absence of other specified parts of digestive tract: Secondary | ICD-10-CM

## 2019-03-09 DIAGNOSIS — Z90711 Acquired absence of uterus with remaining cervical stump: Secondary | ICD-10-CM

## 2019-03-09 DIAGNOSIS — N189 Chronic kidney disease, unspecified: Secondary | ICD-10-CM | POA: Diagnosis present

## 2019-03-09 DIAGNOSIS — Z887 Allergy status to serum and vaccine status: Secondary | ICD-10-CM | POA: Diagnosis not present

## 2019-03-09 DIAGNOSIS — A4151 Sepsis due to Escherichia coli [E. coli]: Secondary | ICD-10-CM | POA: Diagnosis not present

## 2019-03-09 DIAGNOSIS — I480 Paroxysmal atrial fibrillation: Secondary | ICD-10-CM | POA: Diagnosis present

## 2019-03-09 DIAGNOSIS — S0990XA Unspecified injury of head, initial encounter: Secondary | ICD-10-CM | POA: Diagnosis not present

## 2019-03-09 DIAGNOSIS — N179 Acute kidney failure, unspecified: Secondary | ICD-10-CM | POA: Diagnosis present

## 2019-03-09 DIAGNOSIS — J690 Pneumonitis due to inhalation of food and vomit: Secondary | ICD-10-CM | POA: Diagnosis present

## 2019-03-09 DIAGNOSIS — D696 Thrombocytopenia, unspecified: Secondary | ICD-10-CM

## 2019-03-09 DIAGNOSIS — R61 Generalized hyperhidrosis: Secondary | ICD-10-CM | POA: Diagnosis not present

## 2019-03-09 DIAGNOSIS — E785 Hyperlipidemia, unspecified: Secondary | ICD-10-CM | POA: Diagnosis present

## 2019-03-09 DIAGNOSIS — Z7982 Long term (current) use of aspirin: Secondary | ICD-10-CM

## 2019-03-09 DIAGNOSIS — R748 Abnormal levels of other serum enzymes: Secondary | ICD-10-CM

## 2019-03-09 DIAGNOSIS — T796XXS Traumatic ischemia of muscle, sequela: Secondary | ICD-10-CM | POA: Diagnosis not present

## 2019-03-09 DIAGNOSIS — L4050 Arthropathic psoriasis, unspecified: Secondary | ICD-10-CM | POA: Diagnosis present

## 2019-03-09 DIAGNOSIS — G9340 Encephalopathy, unspecified: Secondary | ICD-10-CM | POA: Diagnosis present

## 2019-03-09 DIAGNOSIS — E860 Dehydration: Secondary | ICD-10-CM | POA: Diagnosis present

## 2019-03-09 DIAGNOSIS — T40601A Poisoning by unspecified narcotics, accidental (unintentional), initial encounter: Secondary | ICD-10-CM | POA: Diagnosis present

## 2019-03-09 DIAGNOSIS — E872 Acidosis: Secondary | ICD-10-CM | POA: Diagnosis present

## 2019-03-09 DIAGNOSIS — R4182 Altered mental status, unspecified: Secondary | ICD-10-CM | POA: Diagnosis not present

## 2019-03-09 DIAGNOSIS — M6282 Rhabdomyolysis: Secondary | ICD-10-CM | POA: Diagnosis present

## 2019-03-09 DIAGNOSIS — F329 Major depressive disorder, single episode, unspecified: Secondary | ICD-10-CM | POA: Diagnosis present

## 2019-03-09 DIAGNOSIS — Z8673 Personal history of transient ischemic attack (TIA), and cerebral infarction without residual deficits: Secondary | ICD-10-CM

## 2019-03-09 DIAGNOSIS — K449 Diaphragmatic hernia without obstruction or gangrene: Secondary | ICD-10-CM | POA: Diagnosis present

## 2019-03-09 DIAGNOSIS — W08XXXA Fall from other furniture, initial encounter: Secondary | ICD-10-CM | POA: Diagnosis present

## 2019-03-09 DIAGNOSIS — E441 Mild protein-calorie malnutrition: Secondary | ICD-10-CM | POA: Diagnosis present

## 2019-03-09 DIAGNOSIS — R0602 Shortness of breath: Secondary | ICD-10-CM | POA: Diagnosis not present

## 2019-03-09 DIAGNOSIS — Z91048 Other nonmedicinal substance allergy status: Secondary | ICD-10-CM | POA: Diagnosis not present

## 2019-03-09 DIAGNOSIS — X31XXXA Exposure to excessive natural cold, initial encounter: Secondary | ICD-10-CM | POA: Diagnosis not present

## 2019-03-09 DIAGNOSIS — R5381 Other malaise: Secondary | ICD-10-CM | POA: Diagnosis present

## 2019-03-09 DIAGNOSIS — E039 Hypothyroidism, unspecified: Secondary | ICD-10-CM | POA: Diagnosis present

## 2019-03-09 DIAGNOSIS — I110 Hypertensive heart disease with heart failure: Secondary | ICD-10-CM | POA: Diagnosis present

## 2019-03-09 DIAGNOSIS — Z96652 Presence of left artificial knee joint: Secondary | ICD-10-CM | POA: Diagnosis present

## 2019-03-09 DIAGNOSIS — R410 Disorientation, unspecified: Secondary | ICD-10-CM | POA: Diagnosis not present

## 2019-03-09 DIAGNOSIS — E46 Unspecified protein-calorie malnutrition: Secondary | ICD-10-CM

## 2019-03-09 DIAGNOSIS — F1310 Sedative, hypnotic or anxiolytic abuse, uncomplicated: Secondary | ICD-10-CM | POA: Diagnosis present

## 2019-03-09 DIAGNOSIS — R1312 Dysphagia, oropharyngeal phase: Secondary | ICD-10-CM | POA: Diagnosis not present

## 2019-03-09 DIAGNOSIS — T68XXXA Hypothermia, initial encounter: Principal | ICD-10-CM | POA: Diagnosis present

## 2019-03-09 DIAGNOSIS — R0989 Other specified symptoms and signs involving the circulatory and respiratory systems: Secondary | ICD-10-CM

## 2019-03-09 DIAGNOSIS — Y92009 Unspecified place in unspecified non-institutional (private) residence as the place of occurrence of the external cause: Secondary | ICD-10-CM

## 2019-03-09 DIAGNOSIS — F10239 Alcohol dependence with withdrawal, unspecified: Secondary | ICD-10-CM | POA: Diagnosis present

## 2019-03-09 DIAGNOSIS — Y92000 Kitchen of unspecified non-institutional (private) residence as  the place of occurrence of the external cause: Secondary | ICD-10-CM

## 2019-03-09 DIAGNOSIS — I2510 Atherosclerotic heart disease of native coronary artery without angina pectoris: Secondary | ICD-10-CM | POA: Diagnosis present

## 2019-03-09 DIAGNOSIS — R531 Weakness: Secondary | ICD-10-CM | POA: Diagnosis not present

## 2019-03-09 DIAGNOSIS — Z8249 Family history of ischemic heart disease and other diseases of the circulatory system: Secondary | ICD-10-CM

## 2019-03-09 DIAGNOSIS — Z9181 History of falling: Secondary | ICD-10-CM

## 2019-03-09 DIAGNOSIS — K580 Irritable bowel syndrome with diarrhea: Secondary | ICD-10-CM | POA: Diagnosis present

## 2019-03-09 DIAGNOSIS — F419 Anxiety disorder, unspecified: Secondary | ICD-10-CM | POA: Diagnosis not present

## 2019-03-09 DIAGNOSIS — F1010 Alcohol abuse, uncomplicated: Secondary | ICD-10-CM | POA: Diagnosis not present

## 2019-03-09 DIAGNOSIS — T424X1A Poisoning by benzodiazepines, accidental (unintentional), initial encounter: Secondary | ICD-10-CM | POA: Diagnosis present

## 2019-03-09 DIAGNOSIS — E876 Hypokalemia: Secondary | ICD-10-CM | POA: Diagnosis present

## 2019-03-09 DIAGNOSIS — I130 Hypertensive heart and chronic kidney disease with heart failure and stage 1 through stage 4 chronic kidney disease, or unspecified chronic kidney disease: Secondary | ICD-10-CM | POA: Diagnosis present

## 2019-03-09 DIAGNOSIS — Z6835 Body mass index (BMI) 35.0-35.9, adult: Secondary | ICD-10-CM | POA: Diagnosis not present

## 2019-03-09 DIAGNOSIS — I739 Peripheral vascular disease, unspecified: Secondary | ICD-10-CM | POA: Diagnosis present

## 2019-03-09 DIAGNOSIS — R7989 Other specified abnormal findings of blood chemistry: Secondary | ICD-10-CM | POA: Diagnosis not present

## 2019-03-09 DIAGNOSIS — Z7141 Alcohol abuse counseling and surveillance of alcoholic: Secondary | ICD-10-CM

## 2019-03-09 DIAGNOSIS — G9341 Metabolic encephalopathy: Secondary | ICD-10-CM | POA: Diagnosis not present

## 2019-03-09 DIAGNOSIS — Z96612 Presence of left artificial shoulder joint: Secondary | ICD-10-CM | POA: Diagnosis present

## 2019-03-09 DIAGNOSIS — L659 Nonscarring hair loss, unspecified: Secondary | ICD-10-CM | POA: Diagnosis present

## 2019-03-09 DIAGNOSIS — Z801 Family history of malignant neoplasm of trachea, bronchus and lung: Secondary | ICD-10-CM

## 2019-03-09 DIAGNOSIS — I5033 Acute on chronic diastolic (congestive) heart failure: Secondary | ICD-10-CM | POA: Diagnosis present

## 2019-03-09 DIAGNOSIS — F411 Generalized anxiety disorder: Secondary | ICD-10-CM | POA: Diagnosis present

## 2019-03-09 DIAGNOSIS — I10 Essential (primary) hypertension: Secondary | ICD-10-CM | POA: Diagnosis not present

## 2019-03-09 DIAGNOSIS — I5032 Chronic diastolic (congestive) heart failure: Secondary | ICD-10-CM | POA: Diagnosis present

## 2019-03-09 DIAGNOSIS — K219 Gastro-esophageal reflux disease without esophagitis: Secondary | ICD-10-CM | POA: Diagnosis present

## 2019-03-09 DIAGNOSIS — W19XXXA Unspecified fall, initial encounter: Secondary | ICD-10-CM

## 2019-03-09 DIAGNOSIS — Z79899 Other long term (current) drug therapy: Secondary | ICD-10-CM

## 2019-03-09 DIAGNOSIS — Z79890 Hormone replacement therapy (postmenopausal): Secondary | ICD-10-CM

## 2019-03-09 DIAGNOSIS — Z818 Family history of other mental and behavioral disorders: Secondary | ICD-10-CM

## 2019-03-09 DIAGNOSIS — F13180 Sedative, hypnotic or anxiolytic abuse with sedative, hypnotic or anxiolytic-induced anxiety disorder: Secondary | ICD-10-CM | POA: Diagnosis present

## 2019-03-09 DIAGNOSIS — S80812A Abrasion, left lower leg, initial encounter: Secondary | ICD-10-CM | POA: Diagnosis present

## 2019-03-09 DIAGNOSIS — D62 Acute posthemorrhagic anemia: Secondary | ICD-10-CM | POA: Diagnosis present

## 2019-03-09 DIAGNOSIS — N390 Urinary tract infection, site not specified: Secondary | ICD-10-CM | POA: Diagnosis not present

## 2019-03-09 HISTORY — DX: Personal history of other diseases of the digestive system: Z87.19

## 2019-03-09 LAB — COMPREHENSIVE METABOLIC PANEL
ALBUMIN: 2.7 g/dL — AB (ref 3.5–5.0)
ALT: 50 U/L — ABNORMAL HIGH (ref 0–44)
AST: 46 U/L — ABNORMAL HIGH (ref 15–41)
Alkaline Phosphatase: 132 U/L — ABNORMAL HIGH (ref 38–126)
Anion gap: 15 (ref 5–15)
BILIRUBIN TOTAL: 0.6 mg/dL (ref 0.3–1.2)
BUN: 38 mg/dL — ABNORMAL HIGH (ref 8–23)
CO2: 16 mmol/L — ABNORMAL LOW (ref 22–32)
Calcium: 8.6 mg/dL — ABNORMAL LOW (ref 8.9–10.3)
Chloride: 108 mmol/L (ref 98–111)
Creatinine, Ser: 0.88 mg/dL (ref 0.44–1.00)
GFR calc Af Amer: >60 mL/min (ref >60)
Glucose, Bld: 109 mg/dL — ABNORMAL HIGH (ref 70–99)
POTASSIUM: 4.1 mmol/L (ref 3.5–5.1)
Sodium: 139 mmol/L (ref 135–145)
TOTAL PROTEIN: 5.8 g/dL — AB (ref 6.5–8.1)

## 2019-03-09 LAB — CBC WITH DIFFERENTIAL/PLATELET
Abs Immature Granulocytes: 0.02 10*3/uL (ref 0.00–0.07)
BASOS ABS: 0 10*3/uL (ref 0.0–0.1)
Basophils Relative: 0 %
EOS ABS: 0 10*3/uL (ref 0.0–0.5)
EOS PCT: 0 %
HEMATOCRIT: 37.6 % (ref 36.0–46.0)
Hemoglobin: 12.1 g/dL (ref 12.0–15.0)
Immature Granulocytes: 0 %
LYMPHS ABS: 0.3 10*3/uL — AB (ref 0.7–4.0)
Lymphocytes Relative: 4 %
MCH: 30.6 pg (ref 26.0–34.0)
MCHC: 32.2 g/dL (ref 30.0–36.0)
MCV: 95.2 fL (ref 80.0–100.0)
Monocytes Absolute: 0.6 10*3/uL (ref 0.1–1.0)
Monocytes Relative: 9 %
NRBC: 0 % (ref 0.0–0.2)
Neutro Abs: 5.6 10*3/uL (ref 1.7–7.7)
Neutrophils Relative %: 87 %
Platelets: 94 10*3/uL — ABNORMAL LOW (ref 150–400)
RBC: 3.95 MIL/uL (ref 3.87–5.11)
RDW: 12.6 % (ref 11.5–15.5)
WBC: 6.6 10*3/uL (ref 4.0–10.5)

## 2019-03-09 LAB — URINALYSIS, ROUTINE W REFLEX MICROSCOPIC
Bilirubin Urine: NEGATIVE
GLUCOSE, UA: NEGATIVE mg/dL
KETONES UR: NEGATIVE mg/dL
Leukocytes,Ua: NEGATIVE
Nitrite: NEGATIVE
PH: 5 (ref 5.0–8.0)
Protein, ur: 30 mg/dL — AB
Specific Gravity, Urine: 1.011 (ref 1.005–1.030)

## 2019-03-09 LAB — CK: Total CK: 194 U/L (ref 38–234)

## 2019-03-09 LAB — ETHANOL: Alcohol, Ethyl (B): <10 mg/dL (ref <10)

## 2019-03-09 LAB — LACTIC ACID, PLASMA
LACTIC ACID, VENOUS: 2.5 mmol/L — AB (ref 0.5–1.9)
LACTIC ACID, VENOUS: 1.4 mmol/L (ref 0.5–1.9)
Lactic Acid, Venous: 1.5 mmol/L (ref 0.5–1.9)

## 2019-03-09 LAB — TSH: TSH: 1.746 u[IU]/mL (ref 0.350–4.500)

## 2019-03-09 LAB — TROPONIN I

## 2019-03-09 MED ORDER — METOPROLOL SUCCINATE ER 25 MG PO TB24
25.0000 mg | ORAL_TABLET | Freq: Every day | ORAL | Status: DC
  Administered 2019-03-10 – 2019-03-11 (×2): 25 mg via ORAL
  Filled 2019-03-09 (×2): qty 1

## 2019-03-09 MED ORDER — ONDANSETRON HCL 4 MG PO TABS: 8.0000 mg | ORAL_TABLET | Freq: Three times a day (TID) | ORAL | Status: DC | PRN

## 2019-03-09 MED ORDER — PANTOPRAZOLE SODIUM 40 MG PO TBEC
40.0000 mg | DELAYED_RELEASE_TABLET | Freq: Every day | ORAL | Status: DC
  Administered 2019-03-09 – 2019-03-13 (×5): 40 mg via ORAL
  Filled 2019-03-09 (×5): qty 1

## 2019-03-09 MED ORDER — QUETIAPINE FUMARATE 25 MG PO TABS
25.0000 mg | ORAL_TABLET | Freq: Every day | ORAL | Status: DC
  Administered 2019-03-09 – 2019-03-12 (×4): 25 mg via ORAL
  Filled 2019-03-09 (×4): qty 1

## 2019-03-09 MED ORDER — ACETAMINOPHEN 325 MG PO TABS
650.0000 mg | ORAL_TABLET | Freq: Four times a day (QID) | ORAL | Status: DC | PRN
  Administered 2019-03-10 – 2019-03-13 (×7): 650 mg via ORAL
  Filled 2019-03-09 (×7): qty 2

## 2019-03-09 MED ORDER — ASPIRIN EC 81 MG PO TBEC
81.0000 mg | DELAYED_RELEASE_TABLET | Freq: Every day | ORAL | Status: DC
  Administered 2019-03-09 – 2019-03-13 (×5): 81 mg via ORAL
  Filled 2019-03-09 (×5): qty 1

## 2019-03-09 MED ORDER — SIMVASTATIN 20 MG PO TABS
10.0000 mg | ORAL_TABLET | Freq: Every day | ORAL | Status: DC
  Administered 2019-03-09 – 2019-03-12 (×4): 10 mg via ORAL
  Filled 2019-03-09 (×4): qty 1

## 2019-03-09 MED ORDER — SODIUM CHLORIDE 0.9 % IV SOLN
2.0000 g | INTRAVENOUS | Status: DC
  Administered 2019-03-10: 2 g via INTRAVENOUS
  Filled 2019-03-09: qty 20

## 2019-03-09 MED ORDER — THIAMINE HCL 100 MG PO TABS: 100.0000 mg | ORAL_TABLET | Freq: Every day | ORAL | Status: DC

## 2019-03-09 MED ORDER — SODIUM CHLORIDE 0.9 % IV SOLN
INTRAVENOUS | Status: DC
  Administered 2019-03-09: 15:00:00 via INTRAVENOUS

## 2019-03-09 MED ORDER — LORAZEPAM 2 MG/ML IJ SOLN
2.0000 mg | INTRAMUSCULAR | Status: DC | PRN
  Administered 2019-03-09: 3 mg via INTRAVENOUS
  Administered 2019-03-09: 2 mg via INTRAVENOUS
  Administered 2019-03-10: 3 mg via INTRAVENOUS
  Administered 2019-03-10 – 2019-03-11 (×2): 2 mg via INTRAVENOUS
  Filled 2019-03-09: qty 1
  Filled 2019-03-09: qty 2
  Filled 2019-03-09: qty 1
  Filled 2019-03-09: qty 2
  Filled 2019-03-09: qty 1

## 2019-03-09 MED ORDER — VANCOMYCIN HCL 10 G IV SOLR
1500.0000 mg | Freq: Once | INTRAVENOUS | Status: AC
  Administered 2019-03-09: 1500 mg via INTRAVENOUS
  Filled 2019-03-09: qty 1500

## 2019-03-09 MED ORDER — CHLORDIAZEPOXIDE HCL 5 MG PO CAPS
5.0000 mg | ORAL_CAPSULE | Freq: Two times a day (BID) | ORAL | Status: DC | PRN
  Administered 2019-03-09 – 2019-03-11 (×3): 5 mg via ORAL
  Filled 2019-03-09 (×3): qty 1

## 2019-03-09 MED ORDER — SODIUM CHLORIDE 0.9 % IV SOLN
1.0000 g | INTRAVENOUS | Status: AC
  Administered 2019-03-09: 1 g via INTRAVENOUS
  Filled 2019-03-09: qty 10

## 2019-03-09 MED ORDER — HYOSCYAMINE SULFATE ER 0.375 MG PO TB12
0.3750 mg | ORAL_TABLET | Freq: Two times a day (BID) | ORAL | Status: DC | PRN
  Filled 2019-03-09: qty 1

## 2019-03-09 MED ORDER — VENLAFAXINE HCL ER 37.5 MG PO CP24
37.5000 mg | ORAL_CAPSULE | Freq: Every day | ORAL | Status: DC
  Administered 2019-03-10 – 2019-03-13 (×4): 37.5 mg via ORAL
  Filled 2019-03-09 (×4): qty 1

## 2019-03-09 MED ORDER — HYDRALAZINE HCL 20 MG/ML IJ SOLN
5.0000 mg | Freq: Once | INTRAMUSCULAR | Status: AC
  Administered 2019-03-09: 5 mg via INTRAVENOUS
  Filled 2019-03-09: qty 1

## 2019-03-09 MED ORDER — SODIUM CHLORIDE 0.9% FLUSH
3.0000 mL | Freq: Two times a day (BID) | INTRAVENOUS | Status: DC
  Administered 2019-03-09 – 2019-03-11 (×3): 3 mL via INTRAVENOUS

## 2019-03-09 MED ORDER — LEVOTHYROXINE SODIUM 75 MCG PO TABS
75.0000 ug | ORAL_TABLET | Freq: Every day | ORAL | Status: DC
  Administered 2019-03-10 – 2019-03-13 (×4): 75 ug via ORAL
  Filled 2019-03-09 (×4): qty 1

## 2019-03-09 MED ORDER — VANCOMYCIN HCL 10 G IV SOLR
1250.0000 mg | INTRAVENOUS | Status: DC
  Administered 2019-03-10: 1250 mg via INTRAVENOUS
  Filled 2019-03-09: qty 1250

## 2019-03-09 MED ORDER — SODIUM CHLORIDE 0.9 % IV SOLN
1.0000 g | INTRAVENOUS | Status: DC
  Administered 2019-03-09: 1 g via INTRAVENOUS
  Filled 2019-03-09: qty 10

## 2019-03-09 NOTE — ED Notes (Signed)
ED TO INPATIENT HANDOFF REPORT  ED Nurse Name and Phone #: Kathlee Nations [phone]  S Name/Age/Gender [name] [name] 83 y.o. female Room/Bed: 016C/016C  Code Status   Code Status: Full Code  Home/SNF/Other Home Patient oriented to: self, place and situation Is this baseline? Yes   Triage Complete: Triage complete  Chief Complaint fall  Triage Note Pt BIB [facility] for a fall. EMS advised the pt fell sometime in the middle of the night and family left her on the floor for 7 hours. EMS advised the family advised the pt has increased tremors. EMS advised the pt's daughter left her on the floor and called for a neighbor who is the one came and got the pt off the floor and called 9-1-1. EMS advised the pt's vital signs were stable while in route with no obvious injuries.      Allergies Allergies  Allergen Reactions  . Tetanus Toxoids Swelling and Other (See Comments)    Arm (site) became swollen as a child  . Yellow Dyes (Non-Tartrazine) Itching and Other (See Comments)    Makes patient nervous     Level of Care/Admitting Diagnosis ED Disposition    ED Disposition Condition [facility] Area: [place] [[id number]]  Level of Care: Progressive [102]  Diagnosis: Hypothermia [973532]  Admitting Physician: Nita Sells [phone]  Attending Physician: Nita Sells [phone]  Estimated length of stay: 3 - 4 days  Certification:: I certify this patient will need inpatient services for at least 2 midnights  PT Class (Do Not Modify): Inpatient [101]  PT Acc Code (Do Not Modify): Private [1]       B Medical/Surgery History Past Medical History:  Diagnosis Date  . Alopecia   . Anemia    hx of years ago   . Anxiety   . ARF (acute renal failure) ([employer]) 08/23/2014  . Arthritis    psoriatic arthritis  . Back pain   . Blood transfusion    at age of 4  . Carotid stenosis 12/2016  . Chronic diastolic CHF (congestive heart failure) ([place])   . Depression    . Dysrhythmia 11/13/2016   PAFib for a short time- Echo done 11/14/16 PAF- to follow up with PCP- to see if she is a candiate for anticoags.  Not started at times time due to alcohol abuse.  [name] GERD (gastroesophageal reflux disease)   . H/O hiatal hernia   . History of acute cholangitis   . Hyperlipidemia   . Hypertension   . Hypothyroidism   . IBS (irritable bowel syndrome)   . Pancreatitis   . Peripheral vascular disease ([place])   . PONV (postoperative nausea and vomiting)    with Breast reduction- only time  . Rosacea   . Seizures ([place]) 11/13/2016   Thopught to be from withdrawl Benzodiazepine  . Stroke [facility])    patient denies-  . Thyroid disease    Past Surgical History:  Procedure Laterality Date  . ABDOMINAL HYSTERECTOMY     partial  . BACK SURGERY  2010   thoractic-screws and rods  . BILE DUCT STENT PLACEMENT  08/26/2014  . BREAST REDUCTION SURGERY    . CHOLECYSTECTOMY  1988   lap  . COLONOSCOPY    . COLONOSCOPY Left 09/08/2014   Procedure: COLONOSCOPY;  Surgeon: Arta Silence, MD;  Location: [facility] ENDOSCOPY;  Service: Endoscopy;  Laterality: Left;  . ENDARTERECTOMY Left 12/28/2016  . ENDARTERECTOMY Left 12/28/2016   Procedure: ENDARTERECTOMY CAROTID LEFT WITH Rueben Bash  BIOLOGIC PATCH ANGIOPLASTY;  Surgeon: Angelia Mould, MD;  Location: [place];  Service: Vascular;  Laterality: Left;  . ERCP    . ERCP N/A 08/26/2014   Procedure: ENDOSCOPIC RETROGRADE CHOLANGIOPANCREATOGRAPHY (ERCP);  Surgeon: Jeryl Columbia, MD;  Location: [facility] ENDOSCOPY;  Service: Endoscopy;  Laterality: N/A;  . ERCP N/A 09/11/2014   Procedure: ENDOSCOPIC RETROGRADE CHOLANGIOPANCREATOGRAPHY (ERCP);  Surgeon: Arta Silence, MD;  Location: [facility] ENDOSCOPY;  Service: Endoscopy;  Laterality: N/A;  . EUS N/A 09/11/2014   Procedure: ESOPHAGEAL ENDOSCOPIC ULTRASOUND (EUS) RADIAL;  Surgeon: Arta Silence, MD;  Location: [facility] ENDOSCOPY;  Service: Endoscopy;  Laterality: N/A;  . EYE SURGERY Bilateral    Cataract  .  JOINT REPLACEMENT  2005   left shoulder replacement   . ORIF CLAVICULAR FRACTURE Left 07/18/2014   Procedure: OPEN REDUCTION INTERNAL FIXATION (ORIF) LEFT CLAVICULAR FRACTURE;  Surgeon: Ninetta Lights, MD;  Location: [place];  Service: Orthopedics;  Laterality: Left;  . SPYGLASS CHOLANGIOSCOPY N/A 09/11/2014   Procedure: SPYGLASS CHOLANGIOSCOPY;  Surgeon: Arta Silence, MD;  Location: [facility] ENDOSCOPY;  Service: Endoscopy;  Laterality: N/A;  . TONSILLECTOMY  as child  . TOTAL KNEE ARTHROPLASTY Left 03/10/2016   Procedure: LEFT TOTAL KNEE ARTHROPLASTY;  Surgeon: Ninetta Lights, MD;  Location: [place];  Service: Orthopedics;  Laterality: Left;  [name] VENTRAL HERNIA REPAIR  06/08/2012   Procedure: LAPAROSCOPIC VENTRAL HERNIA;  Surgeon: Adin Hector, MD;  Location: [facility] ORS;  Service: General;  Laterality: Right;     A IV Location/Drains/Wounds Patient Lines/Drains/Airways Status   Active Line/Drains/Airways    Name:   Placement date:   Placement time:   Site:   Days:   Peripheral IV 03/09/19 Left Forearm   03/09/19    1013    Forearm   less than 1   External Urinary Catheter   10/05/18    0401    -   155   Incision (Closed) 03/10/16 Knee Left   03/10/16    1124     1094   Incision (Closed) 12/28/16 Neck Left   12/28/16    0851     801          Intake/Output Last 24 hours  Intake/Output Summary (Last 24 hours) at 03/09/2019 1525 Last data filed at 03/09/2019 1146 Gross per 24 hour  Intake -  Output 525 ml  Net -525 ml    Labs/Imaging Results for orders placed or performed during the hospital encounter of 03/09/19 (from the past 48 hour(s))  CBC with Differential/Platelet     Status: Abnormal   Collection Time: 03/09/19 10:16 AM  Result Value Ref Range   WBC 6.6 4.0 - 10.5 K/uL   RBC 3.95 3.87 - 5.11 MIL/uL   Hemoglobin 12.1 12.0 - 15.0 g/dL   HCT 37.6 36.0 - 46.0 %   MCV 95.2 80.0 - 100.0 fL   MCH 30.6 26.0 - 34.0 pg   MCHC 32.2 30.0 - 36.0 g/dL   RDW 12.6 11.5 -  15.5 %   Platelets 94 (L) 150 - 400 K/uL    Comment: REPEATED TO VERIFY PLATELET COUNT CONFIRMED BY SMEAR Immature Platelet Fraction may be clinically indicated, consider ordering this additional test [id number]    nRBC 0.0 0.0 - 0.2 %   Neutrophils Relative % 87 %   Neutro Abs 5.6 1.7 - 7.7 K/uL   Lymphocytes Relative 4 %   Lymphs Abs 0.3 (L) 0.7 - 4.0 K/uL   Monocytes Relative 9 %  Monocytes Absolute 0.6 0.1 - 1.0 K/uL   Eosinophils Relative 0 %   Eosinophils Absolute 0.0 0.0 - 0.5 K/uL   Basophils Relative 0 %   Basophils Absolute 0.0 0.0 - 0.1 K/uL   Immature Granulocytes 0 %   Abs Immature Granulocytes 0.02 0.00 - 0.07 K/uL    Comment: Performed at [facility], [place] [address] [place], [place] [address]  Comprehensive metabolic panel     Status: Abnormal   Collection Time: 03/09/19 10:16 AM  Result Value Ref Range   Sodium 139 135 - 145 mmol/L   Potassium 4.1 3.5 - 5.1 mmol/L   Chloride 108 98 - 111 mmol/L   CO2 16 (L) 22 - 32 mmol/L   Glucose, Bld 109 (H) 70 - 99 mg/dL   BUN 38 (H) 8 - 23 mg/dL   Creatinine, Ser 0.88 0.44 - 1.00 mg/dL   Calcium 8.6 (L) 8.9 - 10.3 mg/dL   Total Protein 5.8 (L) 6.5 - 8.1 g/dL   Albumin 2.7 (L) 3.5 - 5.0 g/dL   AST 46 (H) 15 - 41 U/L   ALT 50 (H) 0 - 44 U/L   Alkaline Phosphatase 132 (H) 38 - 126 U/L   Total Bilirubin 0.6 0.3 - 1.2 mg/dL   GFR calc non Af Amer >60 >60 mL/min   GFR calc Af Amer >60 >60 mL/min   Anion gap 15 5 - 15    Comment: Performed at [facility], [place] [address], [place], [place] [address]  CK     Status: None   Collection Time: 03/09/19 10:16 AM  Result Value Ref Range   Total CK 194 38 - 234 U/L    Comment: Performed at [facility], [place] [address], [place], [place] [address]  Troponin I - ONCE - STAT     Status: None   Collection Time: 03/09/19 10:16 AM  Result Value Ref Range   Troponin I <0.03 <0.03 ng/mL    Comment: Performed at [place] [address] [place],  [place] [address]  Ethanol     Status: None   Collection Time: 03/09/19 10:16 AM  Result Value Ref Range   Alcohol, Ethyl (B) <10 <10 mg/dL    Comment: (NOTE) Lowest detectable limit for serum alcohol is 10 mg/dL. For medical purposes only. Performed at [facility], [place] [address] [place], [place] [address]   Lactic acid, plasma     Status: Abnormal   Collection Time: 03/09/19 10:19 AM  Result Value Ref Range   Lactic Acid, Venous 2.5 (HH) 0.5 - 1.9 mmol/L    Comment: CRITICAL RESULT CALLED TO, READ BACK BY AND VERIFIED WITH: M.Tamari Redwine,RN 1115 03/09/2019 CLARK,S Performed at [facility], [place] [address] [place], [place] [address]   Urinalysis, Routine w reflex microscopic     Status: Abnormal   Collection Time: 03/09/19 11:36 AM  Result Value Ref Range   Color, Urine YELLOW YELLOW   APPearance CLOUDY (A) CLEAR   Specific Gravity, Urine 1.011 1.005 - 1.030   pH 5.0 5.0 - 8.0   Glucose, UA NEGATIVE NEGATIVE mg/dL   Hgb urine dipstick LARGE (A) NEGATIVE   Bilirubin Urine NEGATIVE NEGATIVE   Ketones, ur NEGATIVE NEGATIVE mg/dL   Protein, ur 30 (A) NEGATIVE mg/dL   Nitrite NEGATIVE NEGATIVE   Leukocytes,Ua NEGATIVE NEGATIVE   RBC / HPF >50 (H) 0 - 5 RBC/hpf   WBC, UA 0-5 0 - 5  WBC/hpf   Bacteria, UA FEW (A) NONE SEEN   Mucus PRESENT    Amorphous Crystal PRESENT     Comment: Performed at [facility], [place] [address], [place], [place] [address]   Dg Chest 2 View  Result Date: 03/09/2019 CLINICAL DATA:  Weakness and fall. EXAM: CHEST - 2 VIEW COMPARISON:  06/26/2017 FINDINGS: The heart size and mediastinal contours are within normal limits. There is no evidence of pulmonary edema, consolidation, pneumothorax, nodule or pleural fluid. Stable appearance of left clavicular reconstruction plate, left shoulder arthroplasty and spinal fusion rods. IMPRESSION: No active cardiopulmonary disease. Electronically Signed   By: Aletta Edouard M.D.   On: 03/09/2019 11:26   Ct Head Wo  Contrast  Result Date: 03/09/2019 CLINICAL DATA:  Unwitnessed fall. EXAM: CT HEAD WITHOUT CONTRAST CT CERVICAL SPINE WITHOUT CONTRAST TECHNIQUE: Multidetector CT imaging of the head and cervical spine was performed following the standard protocol without intravenous contrast. Multiplanar CT image reconstructions of the cervical spine were also generated. COMPARISON:  CT scan of October 05, 2018. FINDINGS: CT HEAD FINDINGS Brain: Mild diffuse cortical atrophy is noted. Minimal chronic ischemic white matter disease is noted. No mass effect or midline shift is noted. Ventricular size is within normal limits. There is no evidence of mass lesion, hemorrhage or acute infarction. Vascular: No hyperdense vessel or unexpected calcification. Skull: Normal. Negative for fracture or focal lesion. Sinuses/Orbits: No acute finding. Other: None. CT CERVICAL SPINE FINDINGS Alignment: Normal. Skull base and vertebrae: No acute fracture. No primary bone lesion or focal pathologic process. Soft tissues and spinal canal: No prevertebral fluid or swelling. No visible canal hematoma. Disc levels: Severe degenerative disc disease is noted at C5-6 and C6-7. Anterior and posterior osteophyte formation is noted at these levels. Upper chest: Negative. Other: Degenerative changes are seen involving posterior facet joints bilaterally. IMPRESSION: Mild diffuse cortical atrophy. Minimal chronic ischemic white matter disease. No acute intracranial abnormality seen. Severe multilevel degenerative disc disease. No acute abnormality seen in the cervical spine. Electronically Signed   By: Marijo Conception, M.D.   On: 03/09/2019 11:23   Ct Cervical Spine Wo Contrast  Result Date: 03/09/2019 CLINICAL DATA:  Unwitnessed fall. EXAM: CT HEAD WITHOUT CONTRAST CT CERVICAL SPINE WITHOUT CONTRAST TECHNIQUE: Multidetector CT imaging of the head and cervical spine was performed following the standard protocol without intravenous contrast. Multiplanar CT  image reconstructions of the cervical spine were also generated. COMPARISON:  CT scan of October 05, 2018. FINDINGS: CT HEAD FINDINGS Brain: Mild diffuse cortical atrophy is noted. Minimal chronic ischemic white matter disease is noted. No mass effect or midline shift is noted. Ventricular size is within normal limits. There is no evidence of mass lesion, hemorrhage or acute infarction. Vascular: No hyperdense vessel or unexpected calcification. Skull: Normal. Negative for fracture or focal lesion. Sinuses/Orbits: No acute finding. Other: None. CT CERVICAL SPINE FINDINGS Alignment: Normal. Skull base and vertebrae: No acute fracture. No primary bone lesion or focal pathologic process. Soft tissues and spinal canal: No prevertebral fluid or swelling. No visible canal hematoma. Disc levels: Severe degenerative disc disease is noted at C5-6 and C6-7. Anterior and posterior osteophyte formation is noted at these levels. Upper chest: Negative. Other: Degenerative changes are seen involving posterior facet joints bilaterally. IMPRESSION: Mild diffuse cortical atrophy. Minimal chronic ischemic white matter disease. No acute intracranial abnormality seen. Severe multilevel degenerative disc disease. No acute abnormality seen in the cervical spine. Electronically Signed   By: Marijo Conception, M.D.  On: 03/09/2019 11:23    Pending Labs Unresulted Labs (From admission, onward)    Start     Ordered   03/10/19 0500  Comprehensive metabolic panel  Tomorrow morning,   R     03/09/19 1502   03/10/19 0500  CBC  Tomorrow morning,   R     03/09/19 1502   03/09/19 1503  Lactic acid, plasma  STAT Now then every 3 hours,   R     03/09/19 1502   03/09/19 1246  TSH  ONCE - STAT,   STAT     03/09/19 1245   03/09/19 1242  Urine Culture  Once,   STAT     03/09/19 1242   03/09/19 1242  Blood culture (routine x 2)  BLOOD CULTURE X 2,   STAT     03/09/19 1242          Vitals/Pain Today's Vitals   03/09/19 1303 03/09/19  1400 03/09/19 1415 03/09/19 1430  BP:  128/82 (!) 144/61   Pulse:  84 84 86  Resp:  20 (!) 21 19  Temp:    (!) 94.6 F (34.8 C)  TempSrc:    Rectal  SpO2:  96% 96% 97%  Weight:      Height:      PainSc: 0-No pain       Isolation Precautions No active isolations  Medications Medications  acetaminophen (TYLENOL) tablet 650 mg (has no administration in time range)  aspirin EC tablet 81 mg (81 mg Oral Given 03/09/19 1513)  metoprolol succinate (TOPROL-XL) 24 hr tablet 25 mg (has no administration in time range)  simvastatin (ZOCOR) tablet 10 mg (has no administration in time range)  venlafaxine XR (EFFEXOR-XR) 24 hr capsule 150 mg (has no administration in time range)  levothyroxine (SYNTHROID, LEVOTHROID) tablet 75 mcg (has no administration in time range)  ondansetron (ZOFRAN) tablet 8 mg (has no administration in time range)  chlordiazePOXIDE (LIBRIUM) capsule 5 mg (has no administration in time range)  QUEtiapine (SEROQUEL) tablet 25 mg (has no administration in time range)  pantoprazole (PROTONIX) EC tablet 40 mg (40 mg Oral Given 03/09/19 1513)  hyoscyamine (LEVBID) 0.375 MG 12 hr tablet 0.375 mg (has no administration in time range)  thiamine tablet 100 mg (has no administration in time range)  sodium chloride flush (NS) 0.9 % injection 3 mL (has no administration in time range)  0.9 %  sodium chloride infusion ( Intravenous New Bag/Given 03/09/19 1516)  LORazepam (ATIVAN) injection 2-3 mg (has no administration in time range)  cefTRIAXone (ROCEPHIN) 1 g in sodium chloride 0.9 % 100 mL IVPB (1 g Intravenous New Bag/Given 03/09/19 1517)  vancomycin (VANCOCIN) 1,500 mg in sodium chloride 0.9 % 500 mL IVPB (has no administration in time range)    Mobility walks with person assist High fall risk   Focused Assessments Neuro Assessment Handoff:               R Recommendations: See Admitting Provider Note  Report given to:   Additional Notes:  Pt concerned about  daughter

## 2019-03-09 NOTE — Assessment & Plan Note (Signed)
Daughter reports increased confusion and tremor over the past week or so, uncertain cause.  Will monitor.

## 2019-03-09 NOTE — Progress Notes (Addendum)
Pharmacy Antibiotic Note  [name] [name] is a 83 y.o. female admitted on 03/09/2019 with sepsis.  Pharmacy has been consulted for vancomycin dosing. On admission patient's temperature 94.6, RR 23, WBC wnl of 6.6, lactic acid elevated at 2.5.   Plan: Vancomycin 1500 mg IV x 1, then 1250 mg IV q24hr (expected AUC 478, Cmax ss 32.4, Cmin ss 11.9 using Scr of 0.88) Obtain vancomycin peak and trough once at steady state Monitor Scr, CBC, and clinical progress F/u C&S, de-escalation plans, and LOT  Height: 5' (152.4 cm) Weight: 180 lb (81.6 kg) IBW/kg (Calculated) : 45.5  Temp (24hrs), Avg:91.1 F (32.8 C), Min:87.6 F (30.9 C), Max:94.6 F (34.8 C)  Recent Labs  Lab 03/09/19 1016 03/09/19 1019  WBC 6.6  --   CREATININE 0.88  --   LATICACIDVEN  --  2.5*    Estimated Creatinine Clearance: 48.2 mL/min (by C-G formula based on SCr of 0.88 mg/dL).    Allergies  Allergen Reactions  . Tetanus Toxoids Swelling and Other (See Comments)    Arm (site) became swollen as a child  . Yellow Dyes (Non-Tartrazine) Itching and Other (See Comments)    Makes patient nervous     Antimicrobials this admission: Vancomycin 3/27 >>  Ceftriaxone 3/27 >>  Dose adjustments this admission: N/A  Microbiology results: 3/27 BCx:  3/27 UCx:    Thank you for allowing pharmacy to be a part of this patient's care.  Leron Croak, PharmD PGY1 Pharmacy Resident Phone: [phone]  Please check AMION for all [facility] phone numbers 03/09/2019 2:58 PM

## 2019-03-09 NOTE — Assessment & Plan Note (Signed)
Continue current home medications, consider de-escalation to less sedating medications on discharge versus defer to PCP

## 2019-03-09 NOTE — ED Notes (Signed)
Blood cultures at bedside.

## 2019-03-09 NOTE — ED Notes (Signed)
Patient denies pain and is resting comfortably.  

## 2019-03-09 NOTE — Assessment & Plan Note (Signed)
Not on anticoagulation due to fall risk.  EKG does not show A. fib at this time.  Will monitor.

## 2019-03-09 NOTE — ED Triage Notes (Signed)
Pt BIB [facility] for a fall. EMS advised the pt fell sometime in the middle of the night and family left her on the floor for 7 hours. EMS advised the family advised the pt has increased tremors. EMS advised the pt's daughter left her on the floor and called for a neighbor who is the one came and got the pt off the floor and called 9-1-1. EMS advised the pt's vital signs were stable while in route with no obvious injuries.

## 2019-03-09 NOTE — Assessment & Plan Note (Signed)
Diet as soon as can confirm safe p.o. intake

## 2019-03-09 NOTE — Assessment & Plan Note (Signed)
Uncertain etiology.  Differential diagnosis includes septic shock, hypothyroidism, head trauma, parkinsonism, anorexia/malnutrition.  Awaiting TSH levels.  Covering with antibiotics vancomycin and ceftriaxone for now until blood cultures/urine culture, Chest x-ray

## 2019-03-09 NOTE — H&P (Signed)
HPI  [name] [name] WUJ:[id number] DOB: 1936-01-04 DOA: 03/09/2019  PCP: [name], MD   Chief Complaint: Fall at home  HPI:   83 y/o CF  Prior lacunar infarcts,   CAD s/p CEA 12/29/2016  PAF Chad2vasc2>4-no AC 2/2 Falls  Htn  HFpEF  Prior 30 year BZD habituation with prior BZD OD 11/18/16 hld Hypothyroid L TKR 03/10/2016  Per EDP-hypothermic-no specific collateral from family-fell in "sun"porches and was cold out there--Allegedly lay there for 7-8 hrs-Tmin 87.6--place don BARI hugger--no source infection-CXR and Urine cleasr CT head and neck CK 194 ETOH neg +BC, +TSH   presents via EMS from home with chief complaint of fall.  Patient is a very poor historian.  She states that she fell off of a stool in her house in her kitchen last night, EMS reported that she was actually found by daughter in the sun room after being down for approximately 7 hours, it is not known how this timeframe was determined.    confused now, thinks that it is February, thinks her husband is alive but then remembers that he passed away this Christmas.     has no complaints and states that she feels fine at the moment.    Patient denies any pain, shortness of breath, chest pain.  She is unclear about the last time that she took her usual medications or what she is taking.  She states "I not really have any medical problems."  I spoke with her daughter, 03/09/19 2:53 PM. Lives at home with patient. Reports she fell off the couch - leaned forward, fell around 01:00 today, daughter heard the fall and went to help her, daughter couldn't move her so put a pillow under her head and some blankets over her until she could get help from the neighbors around 07:00.   Reports over the past week increased tendency to fall, worsening tremors, increasing weakness and confusion. Last visit w/ PCP 2 weeks ago to help address leg pain and falls. Labs drawn, pt was taking diuretic but having issues w/  nocturia/incontinence. Reports spending days in bed over the past few weeks, which is a change from her usual activity.    ED Course: No acute concerns on CT of the head or C-spine.  Main concern is hypothermia.  Alcohol was negative, patient is a known drinker. Blood cultures were collected.  BXD + appropriately  Data-CO2 16 BUN/creatinine up from baseline 29/1 0.1-30 8/0.8 Anion gap 15 LFTs slightly elevated AST ALT 46/50 Albumin is low 2.7 CK is 194 Troponins less than 0.03 Lactic acid is 2.5 down to 1.4  CXR two-view =IMPRESSION: No active cardiopulmonary disease.  Review of Systems:      Past Medical History:  Diagnosis Date  . Alopecia   . Anemia    hx of years ago   . Anxiety   . ARF (acute renal failure) ([place]) 08/23/2014  . Arthritis    psoriatic arthritis  . Back pain   . Blood transfusion    at age of 4  . Carotid stenosis 12/2016  . Chronic diastolic CHF (congestive heart failure) ([place])   . Depression   . Dysrhythmia 11/13/2016   PAFib for a short time- Echo done 11/14/16 PAF- to follow up with PCP- to see if she is a candiate for anticoags.  Not started at times time due to alcohol abuse.  [name] GERD (gastroesophageal reflux disease)   . H/O hiatal hernia   . History of acute cholangitis   .  Hyperlipidemia   . Hypertension   . Hypothyroidism   . IBS (irritable bowel syndrome)   . Pancreatitis   . Peripheral vascular disease ([place])   . PONV (postoperative nausea and vomiting)    with Breast reduction- only time  . Rosacea   . Seizures ([place]) 11/13/2016   Thopught to be from withdrawl Benzodiazepine  . Stroke [facility])    patient denies-  . Thyroid disease     Past Surgical History:  Procedure Laterality Date  . ABDOMINAL HYSTERECTOMY     partial  . BACK SURGERY  2010   thoractic-screws and rods  . BILE DUCT STENT PLACEMENT  08/26/2014  . BREAST REDUCTION SURGERY    . CHOLECYSTECTOMY  1988   lap  . COLONOSCOPY    . COLONOSCOPY Left 09/08/2014    Procedure: COLONOSCOPY;  Surgeon: Arta Silence, MD;  Location: [facility] ENDOSCOPY;  Service: Endoscopy;  Laterality: Left;  . ENDARTERECTOMY Left 12/28/2016  . ENDARTERECTOMY Left 12/28/2016   Procedure: ENDARTERECTOMY CAROTID LEFT WITH XENOSURE BIOLOGIC PATCH ANGIOPLASTY;  Surgeon: Angelia Mould, MD;  Location: [place];  Service: Vascular;  Laterality: Left;  . ERCP    . ERCP N/A 08/26/2014   Procedure: ENDOSCOPIC RETROGRADE CHOLANGIOPANCREATOGRAPHY (ERCP);  Surgeon: Jeryl Columbia, MD;  Location: [facility] ENDOSCOPY;  Service: Endoscopy;  Laterality: N/A;  . ERCP N/A 09/11/2014   Procedure: ENDOSCOPIC RETROGRADE CHOLANGIOPANCREATOGRAPHY (ERCP);  Surgeon: Arta Silence, MD;  Location: [facility] ENDOSCOPY;  Service: Endoscopy;  Laterality: N/A;  . EUS N/A 09/11/2014   Procedure: ESOPHAGEAL ENDOSCOPIC ULTRASOUND (EUS) RADIAL;  Surgeon: Arta Silence, MD;  Location: [facility] ENDOSCOPY;  Service: Endoscopy;  Laterality: N/A;  . EYE SURGERY Bilateral    Cataract  . JOINT REPLACEMENT  2005   left shoulder replacement   . ORIF CLAVICULAR FRACTURE Left 07/18/2014   Procedure: OPEN REDUCTION INTERNAL FIXATION (ORIF) LEFT CLAVICULAR FRACTURE;  Surgeon: Ninetta Lights, MD;  Location: [place];  Service: Orthopedics;  Laterality: Left;  . SPYGLASS CHOLANGIOSCOPY N/A 09/11/2014   Procedure: SPYGLASS CHOLANGIOSCOPY;  Surgeon: Arta Silence, MD;  Location: [facility] ENDOSCOPY;  Service: Endoscopy;  Laterality: N/A;  . TONSILLECTOMY  as child  . TOTAL KNEE ARTHROPLASTY Left 03/10/2016   Procedure: LEFT TOTAL KNEE ARTHROPLASTY;  Surgeon: Ninetta Lights, MD;  Location: [place];  Service: Orthopedics;  Laterality: Left;  [name] VENTRAL HERNIA REPAIR  06/08/2012   Procedure: LAPAROSCOPIC VENTRAL HERNIA;  Surgeon: Adin Hector, MD;  Location: [facility]S;  Service: General;  Laterality: Right;     reports that she has never smoked. She has never used smokeless tobacco. She reports current alcohol use of about 7.0 standard drinks of  alcohol per week. She reports that she does not use drugs. Mobility: Limited.  Patient has great difficulty sitting up in bed on her own.  Is a fall risk, multiple ER visits for falls.  Allergies  Allergen Reactions  . Tetanus Toxoids Swelling and Other (See Comments)    Arm (site) became swollen as a child  . Yellow Dyes (Non-Tartrazine) Itching and Other (See Comments)    Makes patient nervous     Family History  Problem Relation Age of Onset  . Heart disease Mother   . Cancer Father        lung     Prior to Admission medications   Medication Sig Start Date End Date Taking? Authorizing Provider  acetaminophen (TYLENOL) 325 MG tablet Take 2 tablets (650 mg total) by mouth every 6 (six) hours  as needed for mild pain (or Fever >/= 101). 06/30/17   Rai, Ripudeep K, MD  amLODipine (NORVASC) 10 MG tablet Take 1 tablet (10 mg total) by mouth daily. 07/02/17   Rai, Vernelle Emerald, MD  aspirin EC 81 MG tablet Take 81 mg by mouth daily.    [provider]  aspirin-acetaminophen-caffeine (EXCEDRIN MIGRAINE) [phone] MG tablet Take 1-2 tablets by mouth every 6 (six) hours as needed for headache.    [provider]  atenolol (TENORMIN) 50 MG tablet Take 1 tablet (50 mg total) by mouth daily. 03/16/17   Velvet Bathe, MD  chlordiazePOXIDE (LIBRIUM) 5 MG capsule Take 5 mg by mouth 2 (two) times daily as needed for anxiety. Take 5 mg three times a day. On 07/26/2017 start taking 5 mg two times a day. 07/16/17   [provider]  clonazePAM (KLONOPIN) 1 MG tablet Take 1 mg by mouth at bedtime.  07/16/17   [provider]  esomeprazole (NEXIUM) 40 MG capsule Take 40 mg by mouth daily. 08/31/16   [provider]  furosemide (LASIX) 20 MG tablet Take 1 tablet (20 mg total) by mouth daily as needed. For > 3 lb weight gain in 24 hours. 07/23/17   Debbe Odea, MD  gabapentin (NEURONTIN) 400 MG capsule Take 400 mg by mouth 3 (three) times daily as needed (for nerve pain).      [provider]  hyoscyamine (LEVBID) 0.375 MG 12 hr tablet Take 0.375 mg by mouth 2 (two) times daily as needed for cramping.  04/08/17   [provider]  levothyroxine (SYNTHROID, LEVOTHROID) 75 MCG tablet Take 75 mcg by mouth daily before breakfast.  08/28/16   [provider]  ondansetron (ZOFRAN) 8 MG tablet Take 8 mg by mouth every 8 (eight) hours as needed for nausea or vomiting.    [provider]  potassium chloride SA (K-DUR,KLOR-CON) 20 MEQ tablet Take 1 tablet (20 mEq total) by mouth daily as needed. Take only when taking Lasix. 07/23/17   Debbe Odea, MD  QUEtiapine (SEROQUEL) 25 MG tablet Take 1 tablet (25 mg total) by mouth at bedtime. 11/18/16   Regalado, Belkys A, MD  simvastatin (ZOCOR) 10 MG tablet Take 10 mg by mouth at bedtime. 08/09/16   [provider]  thiamine 100 MG tablet Take 1 tablet (100 mg total) by mouth daily. 11/19/16   Regalado, Belkys A, MD  venlafaxine XR (EFFEXOR-XR) 150 MG 24 hr capsule Take 150 mg by mouth daily. 02/18/17   [provider]  zolpidem (AMBIEN) 10 MG tablet Take 10 mg by mouth at bedtime. 06/23/17   [provider]    Physical Exam:  Vitals:   03/09/19 1415 03/09/19 1430  BP: (!) 144/61   Pulse: 84 86  Resp: (!) 21 19  Temp:  (!) 94.6 F (34.8 C)  SpO2: 96% 97%    General: Alert, not oriented, no apparent distress HEENT: No apparent trauma Neck: Normal neck range of motion, no apparent JVD Cardiovascular: S1-S2 normal, heart sounds are faint, no murmur, no significant lower extremity edema or JVD Respiratory: Normal respiratory effort, coarse breath sounds bilaterally in all lung fields Abdomen: Nontender, nondistended, normal bowel sounds.  Rectal exam was deferred. Skin: Abrasion covered by bandage on left lower extremity, dorsal first MTP.  Skin on the right lower extremity slightly erythematous, nontender.  Neuro: Able to move all extremities independently but patient is  generally weak.  Bilateral hand tremor.  EOMI.  I have personally reviewed following labs and imaging studies  Labs:  Results for orders placed or performed during the hospital encounter of 03/09/19 (from the past 24 hour(s))  CBC with Differential/Platelet     Status: Abnormal   Collection Time: 03/09/19 10:16 AM  Result Value Ref Range   WBC 6.6 4.0 - 10.5 K/uL   RBC 3.95 3.87 - 5.11 MIL/uL   Hemoglobin 12.1 12.0 - 15.0 g/dL   HCT 37.6 36.0 - 46.0 %   MCV 95.2 80.0 - 100.0 fL   MCH 30.6 26.0 - 34.0 pg   MCHC 32.2 30.0 - 36.0 g/dL   RDW 12.6 11.5 - 15.5 %   Platelets 94 (L) 150 - 400 K/uL   nRBC 0.0 0.0 - 0.2 %   Neutrophils Relative % 87 %   Neutro Abs 5.6 1.7 - 7.7 K/uL   Lymphocytes Relative 4 %   Lymphs Abs 0.3 (L) 0.7 - 4.0 K/uL   Monocytes Relative 9 %   Monocytes Absolute 0.6 0.1 - 1.0 K/uL   Eosinophils Relative 0 %   Eosinophils Absolute 0.0 0.0 - 0.5 K/uL   Basophils Relative 0 %   Basophils Absolute 0.0 0.0 - 0.1 K/uL   Immature Granulocytes 0 %   Abs Immature Granulocytes 0.02 0.00 - 0.07 K/uL  Comprehensive metabolic panel     Status: Abnormal   Collection Time: 03/09/19 10:16 AM  Result Value Ref Range   Sodium 139 135 - 145 mmol/L   Potassium 4.1 3.5 - 5.1 mmol/L   Chloride 108 98 - 111 mmol/L   CO2 16 (L) 22 - 32 mmol/L   Glucose, Bld 109 (H) 70 - 99 mg/dL   BUN 38 (H) 8 - 23 mg/dL   Creatinine, Ser 0.88 0.44 - 1.00 mg/dL   Calcium 8.6 (L) 8.9 - 10.3 mg/dL   Total Protein 5.8 (L) 6.5 - 8.1 g/dL   Albumin 2.7 (L) 3.5 - 5.0 g/dL   AST 46 (H) 15 - 41 U/L   ALT 50 (H) 0 - 44 U/L   Alkaline Phosphatase 132 (H) 38 - 126 U/L   Total Bilirubin 0.6 0.3 - 1.2 mg/dL   GFR calc non Af Amer >60 >60 mL/min   GFR calc Af Amer >60 >60 mL/min   Anion gap 15 5 - 15  CK     Status: None   Collection Time: 03/09/19 10:16 AM  Result Value Ref Range   Total CK 194 38 - 234 U/L  Troponin I - ONCE - STAT     Status: None   Collection Time: 03/09/19 10:16 AM   Result Value Ref Range   Troponin I <0.03 <0.03 ng/mL  Ethanol     Status: None   Collection Time: 03/09/19 10:16 AM  Result Value Ref Range   Alcohol, Ethyl (B) <10 <10 mg/dL  Lactic acid, plasma     Status: Abnormal   Collection Time: 03/09/19 10:19 AM  Result Value Ref Range   Lactic Acid, Venous 2.5 (HH) 0.5 - 1.9 mmol/L  Urinalysis, Routine w reflex microscopic     Status: Abnormal   Collection Time: 03/09/19 11:36 AM  Result Value Ref Range   Color, Urine YELLOW YELLOW   APPearance CLOUDY (A) CLEAR   Specific Gravity, Urine 1.011 1.005 - 1.030   pH 5.0 5.0 - 8.0   Glucose, UA NEGATIVE NEGATIVE mg/dL   Hgb urine dipstick LARGE (A) NEGATIVE   Bilirubin Urine NEGATIVE NEGATIVE   Ketones, ur  NEGATIVE NEGATIVE mg/dL   Protein, ur 30 (A) NEGATIVE mg/dL   Nitrite NEGATIVE NEGATIVE   Leukocytes,Ua NEGATIVE NEGATIVE   RBC / HPF >50 (H) 0 - 5 RBC/hpf   WBC, UA 0-5 0 - 5 WBC/hpf   Bacteria, UA FEW (A) NONE SEEN   Mucus PRESENT    Amorphous Crystal PRESENT      Imaging studies:  Dg Chest 2 View  Result Date: 03/09/2019 CLINICAL DATA:  Weakness and fall. EXAM: CHEST - 2 VIEW COMPARISON:  06/26/2017 FINDINGS: The heart size and mediastinal contours are within normal limits. There is no evidence of pulmonary edema, consolidation, pneumothorax, nodule or pleural fluid. Stable appearance of left clavicular reconstruction plate, left shoulder arthroplasty and spinal fusion rods. IMPRESSION: No active cardiopulmonary disease. Electronically Signed   By: Aletta Edouard M.D.   On: 03/09/2019 11:26   Ct Head Wo Contrast  Result Date: 03/09/2019 CLINICAL DATA:  Unwitnessed fall. EXAM: CT HEAD WITHOUT CONTRAST CT CERVICAL SPINE WITHOUT CONTRAST TECHNIQUE: Multidetector CT imaging of the head and cervical spine was performed following the standard protocol without intravenous contrast. Multiplanar CT image reconstructions of the cervical spine were also generated. COMPARISON:  CT scan of  October 05, 2018. FINDINGS: CT HEAD FINDINGS Brain: Mild diffuse cortical atrophy is noted. Minimal chronic ischemic white matter disease is noted. No mass effect or midline shift is noted. Ventricular size is within normal limits. There is no evidence of mass lesion, hemorrhage or acute infarction. Vascular: No hyperdense vessel or unexpected calcification. Skull: Normal. Negative for fracture or focal lesion. Sinuses/Orbits: No acute finding. Other: None. CT CERVICAL SPINE FINDINGS Alignment: Normal. Skull base and vertebrae: No acute fracture. No primary bone lesion or focal pathologic process. Soft tissues and spinal canal: No prevertebral fluid or swelling. No visible canal hematoma. Disc levels: Severe degenerative disc disease is noted at C5-6 and C6-7. Anterior and posterior osteophyte formation is noted at these levels. Upper chest: Negative. Other: Degenerative changes are seen involving posterior facet joints bilaterally. IMPRESSION: Mild diffuse cortical atrophy. Minimal chronic ischemic white matter disease. No acute intracranial abnormality seen. Severe multilevel degenerative disc disease. No acute abnormality seen in the cervical spine. Electronically Signed   By: Marijo Conception, M.D.   On: 03/09/2019 11:23   Ct Cervical Spine Wo Contrast  Result Date: 03/09/2019 CLINICAL DATA:  Unwitnessed fall. EXAM: CT HEAD WITHOUT CONTRAST CT CERVICAL SPINE WITHOUT CONTRAST TECHNIQUE: Multidetector CT imaging of the head and cervical spine was performed following the standard protocol without intravenous contrast. Multiplanar CT image reconstructions of the cervical spine were also generated. COMPARISON:  CT scan of October 05, 2018. FINDINGS: CT HEAD FINDINGS Brain: Mild diffuse cortical atrophy is noted. Minimal chronic ischemic white matter disease is noted. No mass effect or midline shift is noted. Ventricular size is within normal limits. There is no evidence of mass lesion, hemorrhage or acute  infarction. Vascular: No hyperdense vessel or unexpected calcification. Skull: Normal. Negative for fracture or focal lesion. Sinuses/Orbits: No acute finding. Other: None. CT CERVICAL SPINE FINDINGS Alignment: Normal. Skull base and vertebrae: No acute fracture. No primary bone lesion or focal pathologic process. Soft tissues and spinal canal: No prevertebral fluid or swelling. No visible canal hematoma. Disc levels: Severe degenerative disc disease is noted at C5-6 and C6-7. Anterior and posterior osteophyte formation is noted at these levels. Upper chest: Negative. Other: Degenerative changes are seen involving posterior facet joints bilaterally. IMPRESSION: Mild diffuse cortical atrophy. Minimal chronic ischemic white matter disease.  No acute intracranial abnormality seen. Severe multilevel degenerative disc disease. No acute abnormality seen in the cervical spine. Electronically Signed   By: Marijo Conception, M.D.   On: 03/09/2019 11:23     Medical tests:   EKG independently reviewed     Decision to obtain old records:   Daughter will bring home med list   Review and summation of old records:   Limited to [facility] records, no records yet available from PCP      Assessment/Plan  Principal Problem: Acute encephalopathy as above Hypothermia-severe  Uncertain etiology.  Differential diagnosis includes septic shock, versus arrhythmia TIA [which I think is very unlikely ] awaiting TSH levels.   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 and BZD use there is reported significant BZD abuse and she will need to be gently de-escalated off of this which can be continued at her  nursing facility once she is more alert oriented  continue current home medications, consider de-escalation to less sedating medicines on discharge versus defer to PCP Protein calorie malnutrition ([place]) -when confirmed safe for p.o. intake Chronic diastolic CHF (congestive heart failure) (HCC) Paroxysmal atrial fibrillation (HCC)  no anticoagulation due to fall risk, will monitor on aspirin only because of this I would keep on monitors indefinitely given this episodic spells where she was found in a stupor AKI (acute kidney injury) ([place]) Metabolic acidosis and mild anion gap acidosis Secondary to being found down, alcoholism etc.   Elevated liver enzymes   Thrombocytopenia (HCC)       Severity of Illness: The appropriate patient status for this patient is INPATIENT. Inpatient status is judged to be reasonable and necessary in order to provide the required intensity of service to ensure the patient's safety. The patient's presenting symptoms, physical exam findings, and initial radiographic and laboratory data in the context of their chronic comorbidities is felt to place them at high risk for further clinical deterioration. Furthermore, it is not anticipated that the patient will be medically stable for discharge from the hospital within 2 midnights of admission. The following factors support the patient status of inpatient.   " The patient's presenting symptoms include hypothermia, fall, confusion. " The worrisome physical exam findings include hypothermia. " The initial radiographic and laboratory data are worrisome because of hypothermia. " The chronic co-morbidities include see problem list and medical history, particularly history of paroxysmal A. fib, hypothyroidism.   * I certify that at the point of admission it is my clinical judgment that the patient will require inpatient hospital care spanning beyond 2 midnights from the point of admission due to high intensity of service, high  risk for further deterioration and high frequency of surveillance required.*     DVT prophylaxis:ASA and ambulate as soon as possible Code Status: FULL Family Communication: Patient reports she is okay with [place] communicating with her daughter [name], she has no other children and is a widow.  She states Catherine's number is [phone] Consults called: none    Time spent: 47 minutes  Magaly Pollina, MD  [facility] Direct contact: [phone] --Via [place]  --[email or web address]; password [email or web address]  7PM-7AM contact night coverage as above  03/09/2019, 2:53 PM

## 2019-03-09 NOTE — ED Provider Notes (Signed)
[facility] EMERGENCY DEPARTMENT Provider Note   CSN: [id number] Arrival date & time: 03/09/19  0930    History   Chief Complaint Chief Complaint  Patient presents with   Fall    HPI [name] [name] is a 83 y.o. female.     Patient with history of heart failure, hypertension, on multiple sedating medications at home, alcohol use, hypothyroidism --presents the emergency department by EMS.  Level 5 caveat due to altered mental status.  Patient reportedly fell in a sun room and was on a tile floor lying for approximately 7 hours.  She was found by family.  EMS was called.  Patient does not complain of any pain at the current time.  EMS reported oral temperature of 93 degrees Fahrenheit.  Patient is unable to give specifics about the fall or the time surrounding the fall.  Per EMS, family reports increased tremors.     Past Medical History:  Diagnosis Date   Alopecia    Anemia    hx of years ago    Anxiety    ARF (acute renal failure) ([place]) 08/23/2014   Arthritis    psoriatic arthritis   Back pain    Blood transfusion    at age of 4   Carotid stenosis 12/2016   Chronic diastolic CHF (congestive heart failure) ([place])    Depression    Dysrhythmia 11/13/2016   PAFib for a short time- Echo done 11/14/16 PAF- to follow up with PCP- to see if she is a candiate for anticoags.  Not started at times time due to alcohol abuse.   GERD (gastroesophageal reflux disease)    H/O hiatal hernia    History of acute cholangitis    Hyperlipidemia    Hypertension    Hypothyroidism    IBS (irritable bowel syndrome)    Pancreatitis    Peripheral vascular disease (HCC)    PONV (postoperative nausea and vomiting)    with Breast reduction- only time   Rosacea    Seizures ([place]) 11/13/2016   Thopught to be from withdrawl Benzodiazepine   Stroke [facility])    patient denies-   Thyroid disease     Patient Active Problem List   Diagnosis Date Noted    Hyperkalemia 07/23/2017   AKI (acute kidney injury) ([place]) 07/23/2017   Sedative, 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   Fall 06/26/2017   Intractable nausea and vomiting 03/11/2017   Acute lower UTI 03/11/2017   Carotid stenosis 12/28/2016   Sedative, hypnotic, or anxiolytic withdrawal delirium 11/15/2016   Paroxysmal atrial fibrillation ([place])    Cerebrovascular disease    Fever    Acute encephalopathy 11/10/2016   Hypothyroidism 11/10/2016   Hypertensive urgency 11/10/2016   Altered mental state 11/10/2016   Seizure ([place]) 11/10/2016   Chronic diastolic CHF (congestive heart failure) (HCC)    Left carotid stenosis 10/29/2016   S/P total knee replacement 03/10/2016   Migration of biliary stent 09/05/2014   Ileus ([place]) 09/05/2014   Leukocytosis 09/05/2014   Vomiting 09/04/2014   Pulmonary edema 09/04/2014   Hypokalemia 08/28/2014   Enteritis due to Clostridium difficile 08/27/2014   Common bile duct (CBD) stricture 08/27/2014   Protein-calorie malnutrition, severe ([place]) 08/24/2014   Obstructive jaundice 08/23/2014   Acute cholangitis 08/23/2014   ARF (acute renal failure) ([place]) 08/23/2014   Coagulopathy ([place]) 08/23/2014   Back pain    Arthritis    Hypertension    GERD (gastroesophageal reflux disease)  Anxiety    IBS (irritable bowel syndrome)    Hernia of Right posterior flank 04/18/2012    Past Surgical History:  Procedure Laterality Date   ABDOMINAL HYSTERECTOMY     partial   BACK SURGERY  2010   thoractic-screws and rods   BILE DUCT STENT PLACEMENT  08/26/2014   BREAST REDUCTION SURGERY     CHOLECYSTECTOMY  1988   lap   COLONOSCOPY     COLONOSCOPY Left 09/08/2014   Procedure: COLONOSCOPY;  Surgeon: Arta Silence, MD;  Location: [facility] ENDOSCOPY;  Service: Endoscopy;  Laterality: Left;   ENDARTERECTOMY Left 12/28/2016   ENDARTERECTOMY Left 12/28/2016   Procedure: ENDARTERECTOMY CAROTID  LEFT WITH XENOSURE BIOLOGIC PATCH ANGIOPLASTY;  Surgeon: Angelia Mould, MD;  Location: [place];  Service: Vascular;  Laterality: Left;   ERCP     ERCP N/A 08/26/2014   Procedure: ENDOSCOPIC RETROGRADE CHOLANGIOPANCREATOGRAPHY (ERCP);  Surgeon: Jeryl Columbia, MD;  Location: [facility] ENDOSCOPY;  Service: Endoscopy;  Laterality: N/A;   ERCP N/A 09/11/2014   Procedure: ENDOSCOPIC RETROGRADE CHOLANGIOPANCREATOGRAPHY (ERCP);  Surgeon: Arta Silence, MD;  Location: [facility] ENDOSCOPY;  Service: Endoscopy;  Laterality: N/A;   EUS N/A 09/11/2014   Procedure: ESOPHAGEAL ENDOSCOPIC ULTRASOUND (EUS) RADIAL;  Surgeon: Arta Silence, MD;  Location: [facility] ENDOSCOPY;  Service: Endoscopy;  Laterality: N/A;   EYE SURGERY Bilateral    Cataract   JOINT REPLACEMENT  2005   left shoulder replacement    ORIF CLAVICULAR FRACTURE Left 07/18/2014   Procedure: OPEN REDUCTION INTERNAL FIXATION (ORIF) LEFT CLAVICULAR FRACTURE;  Surgeon: Ninetta Lights, MD;  Location: [place];  Service: Orthopedics;  Laterality: Left;   SPYGLASS CHOLANGIOSCOPY N/A 09/11/2014   Procedure: HWEXHBZJ CHOLANGIOSCOPY;  Surgeon: Arta Silence, MD;  Location: [facility] ENDOSCOPY;  Service: Endoscopy;  Laterality: N/A;   TONSILLECTOMY  as child   TOTAL KNEE ARTHROPLASTY Left 03/10/2016   Procedure: LEFT TOTAL KNEE ARTHROPLASTY;  Surgeon: Ninetta Lights, MD;  Location: [place];  Service: Orthopedics;  Laterality: Left;   VENTRAL HERNIA REPAIR  06/08/2012   Procedure: LAPAROSCOPIC VENTRAL HERNIA;  Surgeon: Adin Hector, MD;  Location: [facility] ORS;  Service: General;  Laterality: Right;     OB History   No obstetric history on file.      Home Medications    Prior to Admission medications   Medication Sig Start Date End Date Taking? Authorizing Provider  acetaminophen (TYLENOL) 325 MG tablet Take 2 tablets (650 mg total) by mouth every 6 (six) hours as needed for mild pain (or Fever >/= 101). 06/30/17   Rai, Ripudeep K, MD  amLODipine  (NORVASC) 10 MG tablet Take 1 tablet (10 mg total) by mouth daily. 07/02/17   Rai, Vernelle Emerald, MD  aspirin EC 81 MG tablet Take 81 mg by mouth daily.    [provider]  aspirin-acetaminophen-caffeine (EXCEDRIN MIGRAINE) 450 [id number] MG tablet Take 1-2 tablets by mouth every 6 (six) hours as needed for headache.    [provider]  atenolol (TENORMIN) 50 MG tablet Take 1 tablet (50 mg total) by mouth daily. 03/16/17   Velvet Bathe, MD  chlordiazePOXIDE (LIBRIUM) 5 MG capsule Take 5 mg by mouth 2 (two) times daily as needed for anxiety. Take 5 mg three times a day. On 07/26/2017 start taking 5 mg two times a day. 07/16/17   [provider]  clonazePAM (KLONOPIN) 1 MG tablet Take 1 mg by mouth at bedtime.  07/16/17   [provider]  esomeprazole (Peconic) 40 MG  capsule Take 40 mg by mouth daily. 08/31/16   [provider]  furosemide (LASIX) 20 MG tablet Take 1 tablet (20 mg total) by mouth daily as needed. For > 3 lb weight gain in 24 hours. 07/23/17   Debbe Odea, MD  gabapentin (NEURONTIN) 400 MG capsule Take 400 mg by mouth 3 (three) times daily as needed (for nerve pain).     [provider]  hyoscyamine (LEVBID) 0.375 MG 12 hr tablet Take 0.375 mg by mouth 2 (two) times daily as needed for cramping.  04/08/17   [provider]  levothyroxine (SYNTHROID, LEVOTHROID) 75 MCG tablet Take 75 mcg by mouth daily before breakfast.  08/28/16   [provider]  ondansetron (ZOFRAN) 8 MG tablet Take 8 mg by mouth every 8 (eight) hours as needed for nausea or vomiting.    [provider]  potassium chloride SA (K-DUR,KLOR-CON) 20 MEQ tablet Take 1 tablet (20 mEq total) by mouth daily as needed. Take only when taking Lasix. 07/23/17   Debbe Odea, MD  QUEtiapine (SEROQUEL) 25 MG tablet Take 1 tablet (25 mg total) by mouth at bedtime. 11/18/16   Regalado, Belkys A, MD  simvastatin (ZOCOR) 10 MG tablet Take 10 mg by mouth at bedtime. 08/09/16    [provider]  thiamine 100 MG tablet Take 1 tablet (100 mg total) by mouth daily. 11/19/16   Regalado, Belkys A, MD  venlafaxine XR (EFFEXOR-XR) 150 MG 24 hr capsule Take 150 mg by mouth daily. 02/18/17   [provider]  zolpidem (AMBIEN) 10 MG tablet Take 10 mg by mouth at bedtime. 06/23/17   [provider]    Family History Family History  Problem Relation Age of Onset   Heart disease Mother    Cancer Father        lung    Social History Social History   Tobacco Use   Smoking status: Never Smoker   Smokeless tobacco: Never Used  Substance Use Topics   Alcohol use: Yes    Alcohol/week: 7.0 standard drinks    Types: 7 Glasses of wine per week    Comment: rare   Drug use: No    Types: Benzodiazepines     Allergies   Tetanus toxoids and Yellow dyes (non-tartrazine)   Review of Systems Review of Systems  Unable to perform ROS: Mental status change     Physical Exam Updated Vital Signs BP (!) 149/79    Pulse 75    Temp (!) 89.6 F (32 C) (Rectal)    Resp 13    Ht 5' (1.524 m)    Wt 81.6 kg    LMP  (Exact Date)    SpO2 96%    BMI 35.15 kg/m   Physical Exam Vitals signs and nursing note reviewed.  Constitutional:      Appearance: She is well-developed.  HENT:     Head: Normocephalic and atraumatic. No raccoon eyes or Battle's sign.     Right Ear: Tympanic membrane, ear canal and external ear normal. No hemotympanum.     Left Ear: Tympanic membrane, ear canal and external ear normal. No hemotympanum.     Nose: Nose normal.     Mouth/Throat:     Mouth: Mucous membranes are dry.     Pharynx: Uvula midline.     Comments: Dry mucous membranes Eyes:     General: Lids are normal.        Right eye: No discharge.  Left eye: No discharge.     Extraocular Movements:     Right eye: No nystagmus.     Left eye: No nystagmus.     Conjunctiva/sclera: Conjunctivae normal.     Pupils: Pupils are equal, round, and reactive to light.      Comments: No visible hyphema noted  Neck:     Musculoskeletal: Normal range of motion and neck supple.  Cardiovascular:     Rate and Rhythm: Normal rate and regular rhythm.     Heart sounds: Normal heart sounds.  Pulmonary:     Effort: Pulmonary effort is normal.     Breath sounds: Normal breath sounds.  Abdominal:     Palpations: Abdomen is soft.     Tenderness: There is no abdominal tenderness. There is no guarding or rebound.  Genitourinary:    Comments: Pt incontinent with strong odor of urine. There is bright red skin breakdown inner thighs without signs of cellulitis.  Musculoskeletal:        General: No tenderness or deformity.     Cervical back: She exhibits normal range of motion, no tenderness and no bony tenderness.     Thoracic back: She exhibits no tenderness and no bony tenderness.     Lumbar back: She exhibits no tenderness and no bony tenderness.     Comments: Pt able to range arms, hips, and legs voluntarily without pain.   Skin:    General: Skin is warm and dry.     Comments: Foot are cool, arms are warm. There is a small skin lesion dorsum of L foot base of great toe and on plantar aspect of foot without cellulitis.   Neurological:     Mental Status: She is alert. She is disoriented.     GCS: GCS eye subscore is 4. GCS verbal subscore is 5. GCS motor subscore is 6.     Cranial Nerves: No cranial nerve deficit.     Sensory: No sensory deficit.     Motor: Tremor present. No weakness.     Deep Tendon Reflexes: Reflexes are normal and symmetric.     Comments: Gross motor function intact. Hard of hearing. Able to follow basic commands.       ED Treatments / Results  Labs (all labs ordered are listed, but only abnormal results are displayed) Labs Reviewed  CBC WITH DIFFERENTIAL/PLATELET - Abnormal; Notable for the following components:      Result Value   Platelets 94 (*)    Lymphs Abs 0.3 (*)    All other components within normal limits  COMPREHENSIVE  METABOLIC PANEL - Abnormal; Notable for the following components:   CO2 16 (*)    Glucose, Bld 109 (*)    BUN 38 (*)    Calcium 8.6 (*)    Total Protein 5.8 (*)    Albumin 2.7 (*)    AST 46 (*)    ALT 50 (*)    Alkaline Phosphatase 132 (*)    All other components within normal limits  LACTIC ACID, PLASMA - Abnormal; Notable for the following components:   Lactic Acid, Venous 2.5 (*)    All other components within normal limits  URINALYSIS, ROUTINE W REFLEX MICROSCOPIC - Abnormal; Notable for the following components:   APPearance CLOUDY (*)    Hgb urine dipstick LARGE (*)    Protein, ur 30 (*)    RBC / HPF >50 (*)    Bacteria, UA FEW (*)    All other components within normal  limits  URINE CULTURE  CULTURE, BLOOD (ROUTINE X 2)  CULTURE, BLOOD (ROUTINE X 2)  CK  TROPONIN I  ETHANOL  TSH    EKG EKG Interpretation  Date/Time:  Friday March 09 2019 09:55:55 EDT Ventricular Rate:  62 PR Interval:    QRS Duration: 99 QT Interval:  497 QTC Calculation: 505 R Axis:   -3 Text Interpretation:  normal sinus Prolonged QT interval Confirmed by Julianne Rice [phone]) on 03/09/2019 10:03:13 AM Also confirmed by Julianne Rice [phone]), editor Philomena Doheny [phone])  on 03/09/2019 12:41:01 PM   Radiology Dg Chest 2 View  Result Date: 03/09/2019 CLINICAL DATA:  Weakness and fall. EXAM: CHEST - 2 VIEW COMPARISON:  06/26/2017 FINDINGS: The heart size and mediastinal contours are within normal limits. There is no evidence of pulmonary edema, consolidation, pneumothorax, nodule or pleural fluid. Stable appearance of left clavicular reconstruction plate, left shoulder arthroplasty and spinal fusion rods. IMPRESSION: No active cardiopulmonary disease. Electronically Signed   By: Aletta Edouard M.D.   On: 03/09/2019 11:26   Ct Head Wo Contrast  Result Date: 03/09/2019 CLINICAL DATA:  Unwitnessed fall. EXAM: CT HEAD WITHOUT CONTRAST CT CERVICAL SPINE WITHOUT CONTRAST TECHNIQUE: Multidetector  CT imaging of the head and cervical spine was performed following the standard protocol without intravenous contrast. Multiplanar CT image reconstructions of the cervical spine were also generated. COMPARISON:  CT scan of October 05, 2018. FINDINGS: CT HEAD FINDINGS Brain: Mild diffuse cortical atrophy is noted. Minimal chronic ischemic white matter disease is noted. No mass effect or midline shift is noted. Ventricular size is within normal limits. There is no evidence of mass lesion, hemorrhage or acute infarction. Vascular: No hyperdense vessel or unexpected calcification. Skull: Normal. Negative for fracture or focal lesion. Sinuses/Orbits: No acute finding. Other: None. CT CERVICAL SPINE FINDINGS Alignment: Normal. Skull base and vertebrae: No acute fracture. No primary bone lesion or focal pathologic process. Soft tissues and spinal canal: No prevertebral fluid or swelling. No visible canal hematoma. Disc levels: Severe degenerative disc disease is noted at C5-6 and C6-7. Anterior and posterior osteophyte formation is noted at these levels. Upper chest: Negative. Other: Degenerative changes are seen involving posterior facet joints bilaterally. IMPRESSION: Mild diffuse cortical atrophy. Minimal chronic ischemic white matter disease. No acute intracranial abnormality seen. Severe multilevel degenerative disc disease. No acute abnormality seen in the cervical spine. Electronically Signed   By: Marijo Conception, M.D.   On: 03/09/2019 11:23   Ct Cervical Spine Wo Contrast  Result Date: 03/09/2019 CLINICAL DATA:  Unwitnessed fall. EXAM: CT HEAD WITHOUT CONTRAST CT CERVICAL SPINE WITHOUT CONTRAST TECHNIQUE: Multidetector CT imaging of the head and cervical spine was performed following the standard protocol without intravenous contrast. Multiplanar CT image reconstructions of the cervical spine were also generated. COMPARISON:  CT scan of October 05, 2018. FINDINGS: CT HEAD FINDINGS Brain: Mild diffuse cortical  atrophy is noted. Minimal chronic ischemic white matter disease is noted. No mass effect or midline shift is noted. Ventricular size is within normal limits. There is no evidence of mass lesion, hemorrhage or acute infarction. Vascular: No hyperdense vessel or unexpected calcification. Skull: Normal. Negative for fracture or focal lesion. Sinuses/Orbits: No acute finding. Other: None. CT CERVICAL SPINE FINDINGS Alignment: Normal. Skull base and vertebrae: No acute fracture. No primary bone lesion or focal pathologic process. Soft tissues and spinal canal: No prevertebral fluid or swelling. No visible canal hematoma. Disc levels: Severe degenerative disc disease is noted at C5-6 and C6-7. Anterior and  posterior osteophyte formation is noted at these levels. Upper chest: Negative. Other: Degenerative changes are seen involving posterior facet joints bilaterally. IMPRESSION: Mild diffuse cortical atrophy. Minimal chronic ischemic white matter disease. No acute intracranial abnormality seen. Severe multilevel degenerative disc disease. No acute abnormality seen in the cervical spine. Electronically Signed   By: Marijo Conception, M.D.   On: 03/09/2019 11:23    Procedures Procedures (including critical care time)  Medications Ordered in ED Medications - No data to display   Initial Impression / Assessment and Plan / ED Course  I have reviewed the triage vital signs and the nursing notes.  Pertinent labs & imaging results that were available during my care of the patient were reviewed by me and considered in my medical decision making (see chart for details).        Patient seen and examined. Work-up initiated. Medications ordered. Rectal temp ordered.   Vital signs reviewed and are as follows: BP (!) 149/79    Pulse 75    Temp (!) 89.6 F (32 C) (Rectal)    Resp 13    Ht 5' (1.524 m)    Wt 81.6 kg    LMP  (Exact Date)    SpO2 96%    BMI 35.15 kg/m   10:00 AM Rectal temp 87 degrees. Retail banker in  place.   12:20 PM Temp slowly improving. Patient sleeping but awakens easily and is conversant. She gave me permission to try to contact her daughter -- no answer.   12:41 PM UA reviewed. Labs reassuring. Suspect blood 2/2 cath procedure. Urine culture sent.   Will call for admit.   1:33 PM Discussed with Dr. Verlon Au who will see patient.   CRITICAL CARE Performed by: Carlisle Cater PA-C Total critical care time: 35 minutes Critical care time was exclusive of separately billable procedures and treating other patients. Critical care was necessary to treat or prevent imminent or life-threatening deterioration. Critical care was time spent personally by me on the following activities: development of treatment plan with patient and/or surrogate as well as nursing, discussions with consultants, evaluation of patient's response to treatment, examination of patient, obtaining history from patient or surrogate, ordering and performing treatments and interventions, ordering and review of laboratory studies, ordering and review of radiographic studies, pulse oximetry and re-evaluation of patient's condition.   Final Clinical Impressions(s) / ED Diagnoses   Final diagnoses:  Hypothermia due to exposure  Fall in home, initial encounter  Confusion   Admit.   ED Discharge Orders    None       Carlisle Cater, Hershal Coria 03/09/19 1334    Julianne Rice, MD 03/12/19 [phone]

## 2019-03-10 ENCOUNTER — Inpatient Hospital Stay (HOSPITAL_COMMUNITY): Payer: Medicare Other

## 2019-03-10 DIAGNOSIS — I5032 Chronic diastolic (congestive) heart failure: Secondary | ICD-10-CM

## 2019-03-10 DIAGNOSIS — I480 Paroxysmal atrial fibrillation: Secondary | ICD-10-CM

## 2019-03-10 DIAGNOSIS — N179 Acute kidney failure, unspecified: Secondary | ICD-10-CM

## 2019-03-10 DIAGNOSIS — F419 Anxiety disorder, unspecified: Secondary | ICD-10-CM

## 2019-03-10 DIAGNOSIS — G9340 Encephalopathy, unspecified: Secondary | ICD-10-CM

## 2019-03-10 LAB — COMPREHENSIVE METABOLIC PANEL
ALT: 45 U/L — AB (ref 0–44)
AST: 40 U/L (ref 15–41)
Albumin: 2.6 g/dL — ABNORMAL LOW (ref 3.5–5.0)
Alkaline Phosphatase: 127 U/L — ABNORMAL HIGH (ref 38–126)
Anion gap: 9 (ref 5–15)
BUN: 34 mg/dL — ABNORMAL HIGH (ref 8–23)
CO2: 17 mmol/L — ABNORMAL LOW (ref 22–32)
CREATININE: 1.43 mg/dL — AB (ref 0.44–1.00)
Calcium: 7.8 mg/dL — ABNORMAL LOW (ref 8.9–10.3)
Chloride: 117 mmol/L — ABNORMAL HIGH (ref 98–111)
GFR calc Af Amer: 40 mL/min — ABNORMAL LOW (ref >60)
GFR calc non Af Amer: 35 mL/min — ABNORMAL LOW (ref >60)
Glucose, Bld: 99 mg/dL (ref 70–99)
Potassium: 5 mmol/L (ref 3.5–5.1)
Sodium: 143 mmol/L (ref 135–145)
Total Bilirubin: 0.7 mg/dL (ref 0.3–1.2)
Total Protein: 5.4 g/dL — ABNORMAL LOW (ref 6.5–8.1)

## 2019-03-10 LAB — URINE CULTURE: CULTURE: NO GROWTH

## 2019-03-10 LAB — CBC
HCT: 36 % (ref 36.0–46.0)
Hemoglobin: 11.8 g/dL — ABNORMAL LOW (ref 12.0–15.0)
MCH: 30.9 pg (ref 26.0–34.0)
MCHC: 32.8 g/dL (ref 30.0–36.0)
MCV: 94.2 fL (ref 80.0–100.0)
Platelets: 116 10*3/uL — ABNORMAL LOW (ref 150–400)
RBC: 3.82 MIL/uL — ABNORMAL LOW (ref 3.87–5.11)
RDW: 13 % (ref 11.5–15.5)
WBC: 9.9 10*3/uL (ref 4.0–10.5)
nRBC: 0 % (ref 0.0–0.2)

## 2019-03-10 MED ORDER — LACTATED RINGERS IV SOLN
INTRAVENOUS | Status: DC
  Administered 2019-03-10 (×2): via INTRAVENOUS

## 2019-03-10 MED ORDER — HYDRALAZINE HCL 20 MG/ML IJ SOLN
10.0000 mg | Freq: Once | INTRAMUSCULAR | Status: AC
  Administered 2019-03-10: 10 mg via INTRAVENOUS
  Filled 2019-03-10: qty 1

## 2019-03-10 MED ORDER — VENLAFAXINE HCL ER 37.5 MG PO CP24: 37.5000 mg | ORAL_CAPSULE | Freq: Every day | ORAL | Status: DC

## 2019-03-10 MED ORDER — FLUOXETINE HCL 20 MG PO CAPS
20.0000 mg | ORAL_CAPSULE | Freq: Every day | ORAL | Status: DC
  Administered 2019-03-10 – 2019-03-13 (×4): 20 mg via ORAL
  Filled 2019-03-10 (×4): qty 1

## 2019-03-10 NOTE — Progress Notes (Signed)
Rehab Admissions Coordinator Note:  Patient was screened by Cleatrice Burke for appropriateness for an Inpatient Acute Rehab Consult.  At this time, we are recommending Inpatient Rehab consult.  Cleatrice Burke 03/10/2019, 6:40 PM  I can be reached at [phone].

## 2019-03-10 NOTE — Evaluation (Signed)
Occupational Therapy Evaluation Patient Details Name: [name] [name] MRN: [id number] DOB: [birth year] Today's Date: 03/10/2019    History of Present Illness Pt is an 83 yo female s/p fall found down by daughter awaiting 7 hours to be assisted by neighbors; pt found to be hypothermic, confused and very weak. Pt PMHx: alcoholism, back sx, paraoxysmal Afib, falls, HTN, CHF, hypothyroidism, anxiety.   Clinical Impression   Pt is an 83 yo female s/p above dx. Pt PTA: living with daughter. Pt currently performing ADLs with increased assist for UB and LB due to increased swelling, confusion and weakness. Pt unable to stand >30 secs at a time with maxA +2/ Bari-stedy used for transfer to recliner. Pt unable to perform grooming task as BUEs weak and increased pain from arthritis from edema. Pt performing some mobility with maxA +2 to take a few steps, but pt only able to slide BLEs with assist and pt very unsteady. Pt requires continued OT skilled services for ADL, mobility and safety in CIR setting. If pt cannot tolerate CIR, SNF level could be appropriate. OT to follow acutely.      Follow Up Recommendations  CIR;Supervision/Assistance - 24 hour    Equipment Recommendations  Other (comment)(to be determined each visit)    Recommendations for Other Services       Precautions / Restrictions Restrictions Weight Bearing Restrictions: Yes      Mobility Bed Mobility Overal bed mobility: Needs Assistance Bed Mobility: Sidelying to Sit   Sidelying to sit: Max assist;HOB elevated;+2 for physical assistance       General bed mobility comments: Pt slow to follow commands and requires assist for trunk elevation and BLEs movement.  Transfers Overall transfer level: Modified independent Equipment used: Rolling walker (2 wheeled)             General transfer comment: STS with maxA+2     Balance Overall balance assessment: Modified Independent                                          ADL either performed or assessed with clinical judgement   ADL Overall ADL's : Needs assistance/impaired Eating/Feeding: Minimal assistance;Sitting(hands with increased swelling and arthritis)   Grooming: Wash/dry hands;Wash/dry face;Oral care;Moderate assistance;Sitting(hands with increased swelling and arthritis)   Upper Body Bathing: Maximal assistance;Sitting   Lower Body Bathing: Maximal assistance;Total assistance;Sitting/lateral leans   Upper Body Dressing : Moderate assistance;Sitting   Lower Body Dressing: Maximal assistance;Total assistance;Sitting/lateral leans   Toilet Transfer: Total assistance;BSC(requires bari stedy)           Functional mobility during ADLs: Maximal assistance;+2 for physical assistance;Rolling walker(unable to take steps safely.) General ADL Comments: Pt requires increased time for all tasks and remains confused. swelling is abundant and pt unable to use hands without poor fine motor coordination due to swelling. Pt unable to brush teeth and wash face this AM without assist. Baristedy for transfers.     Vision Baseline Vision/History: No visual deficits Vision Assessment?: No apparent visual deficits     Perception     Praxis      Pertinent Vitals/Pain Pain Assessment: Faces Faces Pain Scale: Hurts little more Pain Descriptors / Indicators: Discomfort Pain Intervention(s): Limited activity within patient's tolerance;Repositioned     Hand Dominance Right   Extremity/Trunk Assessment Upper Extremity Assessment Upper Extremity Assessment: Generalized weakness   Lower Extremity Assessment Lower Extremity Assessment: Generalized weakness;RLE  deficits/detail RLE Deficits / Details: unable to take steps without shifting wt laterally to have maxA from PT to move RLE. RLE Coordination: decreased fine motor;decreased gross motor   Cervical / Trunk Assessment Cervical / Trunk Assessment: Normal   Communication  Communication Communication: Receptive difficulties(confusion)   Cognition Arousal/Alertness: Awake/alert Behavior During Therapy: Anxious Overall Cognitive Status: Impaired/Different from baseline Area of Impairment: Safety/judgement;Awareness;Attention;Memory                   Current Attention Level: Sustained     Safety/Judgement: Decreased awareness of safety;Decreased awareness of deficits     General Comments: Pt following commands, but required multiple verbal cues to attend to task. pt perseverating on calling her daughter regardless of reassurance that the MD had sopken with her this AM.   General Comments  Pt requiring increased time. O2 >90% on RA. Severe swelling all over. Pt had red marks on back from bed indentations and from O2 [place].    Exercises     Shoulder Instructions      Home Living Family/patient expects to be discharged to:: Private residence Living Arrangements: Children(daughter) Available Help at Discharge: Family Type of Home: House Home Access: Stairs to enter Technical brewer of Steps: 5   Home Layout: Two level     Bathroom Shower/Tub: Teacher, early years/pre: [place]: Environmental consultant - 2 wheels;Cane - single point          Prior Functioning/Environment Level of Independence: Needs assistance  Gait / Transfers Assistance Needed: RW ADL's / Homemaking Assistance Needed: Poor historian- requires assist with bathing; IADLs mostly likely provided for her as pt stated that her daughter does not work.            OT Problem List: Decreased strength;Decreased activity tolerance;Impaired balance (sitting and/or standing);Decreased coordination;Decreased safety awareness      OT Treatment/Interventions: Self-care/ADL training;Therapeutic exercise;Neuromuscular education;Energy conservation;DME and/or AE instruction;Therapeutic activities;Cognitive remediation/compensation;Patient/family education;Balance  training    OT Goals(Current goals can be found in the care plan section) Acute Rehab OT Goals Patient Stated Goal: to go home OT Goal Formulation: With patient Time For Goal Achievement: 03/24/19 Potential to Achieve Goals: Fair ADL Goals Pt Will Perform Eating: with modified independence Pt Will Perform Grooming: with modified independence Pt Will Transfer to Toilet: with min assist Pt Will Perform Toileting - Clothing Manipulation and hygiene: with min assist;sit to/from stand Pt/caregiver will Perform Home Exercise Program: Both right and left upper extremity;With written HEP provided;With Supervision Additional ADL Goal #1: Pt will perform 10 mins of sustained functional tasks in order to increase activity tolerance with intermittent standing.  OT Frequency: Min 3X/week   Barriers to D/C:            Co-evaluation              AM-PAC OT "6 Clicks" Daily Activity     Outcome Measure Help from another person eating meals?: A Lot Help from another person taking care of personal grooming?: A Lot Help from another person toileting, which includes using toliet, bedpan, or urinal?: Total Help from another person bathing (including washing, rinsing, drying)?: A Lot Help from another person to put on and taking off regular upper body clothing?: A Lot Help from another person to put on and taking off regular lower body clothing?: A Lot 6 Click Score: 11   End of Session Equipment Utilized During Treatment: Gait belt;Rolling walker Nurse Communication: Mobility status;Need for lift equipment(bari stedy)  Activity Tolerance: Patient limited by fatigue Patient left: in chair;with call bell/phone within reach;with chair alarm set  OT Visit Diagnosis: Unsteadiness on feet (R26.81);Muscle weakness (generalized) (M62.81)                Time: 7741-4239 OT Time Calculation (min): 32 min Charges:  OT General Charges $OT Visit: 1 Visit OT Evaluation $OT Eval Moderate Complexity: 1  Mod  Darryl Nestle) Marsa Aris OTR/L Acute Rehabilitation Services Pager: [phone] Office: [phone]   Fredda Hammed 03/10/2019, 3:22 PM

## 2019-03-10 NOTE — Progress Notes (Signed)
PHARMACY - PHYSICIAN COMMUNICATION CRITICAL VALUE ALERT - BLOOD CULTURE IDENTIFICATION (BCID)  [name] [name] is an 83 y.o. female who presented to [facility] on 03/09/2019 with a chief complaint of fall  3/28 AM: 1/4 bottles with gram positive rods, likely contaminant   Name of physician (or Provider) Contacted: Jeannette Corpus  Current antibiotics: Vancomycin/Ceftriaxone   Changes to prescribed antibiotics recommended:  Consider DC Vancomycin later today Continue Ceftriaxone until urine culture results  No results found for this or any previous visit.  [name] 03/10/2019  1:11 AM

## 2019-03-10 NOTE — Evaluation (Addendum)
Physical Therapy Evaluation Patient Details Name: [name] [name] MRN: [id number] DOB: August 02, 1936 Today's Date: 03/10/2019   History of Present Illness  Pt is an 83 yo female s/p fall found down by daughter awaiting 7 hours to be assisted by neighbors; pt found to be hypothermic, confused and very weak. Pt PMHx: alcoholism, back sx, paraoxysmal Afib, falls, HTN, CHF, hypothyroidism, anxiety.    Clinical Impression  Pt presented supine in bed with HOB elevated, awake and willing to participate in therapy session. Prior to admission, pt reported that she ambulates with use of RW and requires some assistance with ADLs. Pt lives with her daughter in a two level house with five steps to enter. She is currently very limited secondary to weakness. Pt on RA throughout with SPO2 maintaining >90% with all activity. She required two person heavy physical assistance for bed mobility and transfers with use of STEDY to transfer to chair. Pt would continue to benefit from skilled physical therapy services at this time while admitted and after d/c to address the below listed limitations in order to improve overall safety and independence with functional mobility.     Follow Up Recommendations SNF    Equipment Recommendations  None recommended by PT    Recommendations for Other Services       Precautions / Restrictions Precautions Precautions: Fall Restrictions Weight Bearing Restrictions: No      Mobility  Bed Mobility Overal bed mobility: Needs Assistance Bed Mobility: Supine to Sit   Sidelying to sit: Max assist;HOB elevated;+2 for physical assistance Supine to sit: Max assist;+2 for physical assistance     General bed mobility comments: Pt slow to follow commands and requires assist for trunk elevation and BLEs movement.  Transfers Overall transfer level: Needs assistance Equipment used: 2 person hand held assist Transfers: Sit to/from Stand Sit to Stand: Max assist;+2 physical  assistance;+2 safety/equipment         General transfer comment: pt performed sit-to-stand from EOB x2, once with [facility] and once with use of STEDY; pt required multimodal cueing and max A x2 for both. Transferred to chair with use of STEDY  Ambulation/Gait             General Gait Details: unable  Stairs            Wheelchair Mobility    Modified Rankin (Stroke Patients Only)       Balance Overall balance assessment: Needs assistance Sitting-balance support: Feet supported Sitting balance-Leahy Scale: Fair     Standing balance support: Bilateral upper extremity supported Standing balance-Leahy Scale: Poor                               Pertinent Vitals/Pain Pain Assessment: Faces Faces Pain Scale: Hurts little more Pain Location: generalized Pain Descriptors / Indicators: Discomfort Pain Intervention(s): Monitored during session    Home Living Family/patient expects to be discharged to:: Private residence Living Arrangements: Children(daughter) Available Help at Discharge: Family Type of Home: House Home Access: Stairs to enter   Technical brewer of Steps: 5 Home Layout: Two level Home Equipment: Environmental consultant - 2 wheels;Cane - single point      Prior Function Level of Independence: Needs assistance   Gait / Transfers Assistance Needed: ambulates with RW  ADL's / Homemaking Assistance Needed: Poor historian- requires assist with bathing; IADLs mostly likely provided for her as pt stated that her daughter does not work.  Hand Dominance   Dominant Hand: Right    Extremity/Trunk Assessment   Upper Extremity Assessment Upper Extremity Assessment: Generalized weakness    Lower Extremity Assessment Lower Extremity Assessment: Generalized weakness RLE Deficits / Details: unable to take steps without shifting wt laterally to have maxA from PT to move RLE. RLE Coordination: decreased fine motor;decreased gross motor     Cervical / Trunk Assessment Cervical / Trunk Assessment: Normal  Communication   Communication: HOH  Cognition Arousal/Alertness: Awake/alert Behavior During Therapy: Anxious Overall Cognitive Status: Impaired/Different from baseline Area of Impairment: Attention;Memory;Following commands;Safety/judgement;Problem solving;Awareness                   Current Attention Level: Sustained Memory: Decreased short-term memory Following Commands: Follows one step commands with increased time Safety/Judgement: Decreased awareness of safety;Decreased awareness of deficits Awareness: Intellectual Problem Solving: Slow processing;Decreased initiation;Difficulty sequencing;Requires verbal cues General Comments: Pt following commands, but required multiple verbal cues to attend to task. pt perseverating on calling her daughter regardless of reassurance that the MD had sopken with her this AM.      General Comments General comments (skin integrity, edema, etc.): Pt requiring increased time. O2 >90% on RA. Severe swelling all over. Pt had red marks on back from bed indentations and from O2 [place].    Exercises     Assessment/Plan    PT Assessment Patient needs continued PT services  PT Problem List Decreased strength;Decreased activity tolerance;Decreased balance;Decreased mobility;Decreased coordination;Decreased cognition;Decreased knowledge of use of DME;Decreased safety awareness;Decreased knowledge of precautions;Pain       PT Treatment Interventions DME instruction;Gait training;Stair training;Therapeutic exercise;Balance training;Neuromuscular re-education;Functional mobility training;Therapeutic activities;Cognitive remediation;Patient/family education    PT Goals (Current goals can be found in the Care Plan section)  Acute Rehab PT Goals Patient Stated Goal: "to go home" PT Goal Formulation: With patient Time For Goal Achievement: 03/24/19 Potential to Achieve Goals: Fair     Frequency Min 2X/week   Barriers to discharge        Co-evaluation PT/OT/SLP Co-Evaluation/Treatment: Yes Reason for Co-Treatment: For patient/therapist safety;To address functional/ADL transfers PT goals addressed during session: Mobility/safety with mobility;Balance;Proper use of DME;Strengthening/ROM         AM-PAC PT "6 Clicks" Mobility  Outcome Measure Help needed turning from your back to your side while in a flat bed without using bedrails?: Total Help needed moving from lying on your back to sitting on the side of a flat bed without using bedrails?: Total Help needed moving to and from a bed to a chair (including a wheelchair)?: Total Help needed standing up from a chair using your arms (e.g., wheelchair or bedside chair)?: Total Help needed to walk in hospital room?: Total Help needed climbing 3-5 steps with a railing? : Total 6 Click Score: 6    End of Session Equipment Utilized During Treatment: Gait belt Activity Tolerance: Patient limited by fatigue Patient left: in chair;with call bell/phone within reach;with chair alarm set Nurse Communication: Mobility status PT Visit Diagnosis: Other abnormalities of gait and mobility (R26.89);Muscle weakness (generalized) (M62.81)    Time: 1601-0932 PT Time Calculation (min) (ACUTE ONLY): 39 min   Charges:   PT Evaluation $PT Eval Moderate Complexity: 1 Mod PT Treatments $Therapeutic Activity: 8-22 mins        Sherie Don, PT, DPT  Acute Rehabilitation Services Pager [phone] Office [place] 03/10/2019, 3:51 PM

## 2019-03-10 NOTE — Progress Notes (Signed)
PROGRESS NOTE    [name] [name]  KXF:[id number] DOB: 08-Aug-1936 DOA: 03/09/2019 PCP: [name], MD    Brief Narrative:  Patient is a 83 year old female with history of previous strokes, coronary artery disease, paroxysmal atrial fibrillation not on anticoagulation secondary to fall, hypertension, heart failure with preserved ejection fraction, on multiple antianxiety medications, hypothyroidism who was brought to the hospital where she was found falling on the porch and laying on floor for a while.  EMS found her hypothermic.  Patient was confused.  No clear source of infection. She has been admitted and treated as sepsis.  1 of the blood cultures positive for gram-positive rods.   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  Also possibility of sepsis.  She has been given resuscitating fluid and clinically improving.  Blood pressures are stable.  Temperature has improved and normalized now.  Patient had received vancomycin and ceftriaxone.  1 of the blood cultures positive for gram-positive rods?  Significance unknown.  Urine cultures pending.TSH normal. Given severity of presentation, will continue vancomycin and ceftriaxone today.  Will be able to de-escalate by tomorrow.  Acute kidney injury: Due to above.  Chloride is high.  Will change to Ringer lactate.  Continue to monitor intake output.  Will recheck levels in the morning.  Altered mental status: No focal neurological deficit.  CT scan of the head neck and a skeletal survey was negative.  Patient also on  multiple anxiety and depression medications.  She also consumes about half a bottle of wine a day.  Alcohol level was normal on presentation. Suspect polypharmacy versus alcohol withdrawal. On chronic SSRI and SNRI, will continue. Decrease dose of Librium, changed to as needed. As needed Ativan for withdrawal symptoms.  Abnormal liver function test: Probably due to #1.  Repeat test with improvement.  Chronic diastolic heart failure: Euvolemic on examination.  Continue to hold diuretics.  Hypertension: Fairly stable now.  She is currently not taking any medications at home.  Will monitor and put her back on amlodipine if persistently high.  Call was placed to patient's daughter and discussed in details.  She stated that patient never had altered mentation in the past.  She states that patient was mostly sedentary, however with no other preceding illness.  Advance activities today with PT OT.  Continue to monitor.  DVT prophylaxis: SCDs Code Status: Full code Family Communication: Daughter over the phone.  Her husband is deceased. Disposition Plan: Home with home health care versus SNF.   Consultants:   None.  Procedures:   None.  Antimicrobials:   Vancomycin 03/09/2019  Rocephin 03/09/2019   Subjective: Patient seen and examined.  She had some confusion overnight and was given Ativan.  No other overnight events.  Remains afebrile.  Temperature improved with transient warming blankets. Patient states that she fell from her sofa and could not get up.  Still somehow confused.  Objective: Vitals:   03/09/19 1630 03/09/19 1826 03/09/19 2019 03/10/19 0346  BP: (!) 161/66  (!) 176/90   Pulse: 96     Resp: (!) 24  Temp: 98.1 F (36.7 C) 97.8 F (36.6 C)  97.6 F (36.4 C)  TempSrc: Oral Oral  Oral  SpO2: 97%     Weight:      Height:        Intake/Output Summary (Last 24 hours) at 03/10/2019 0917 Last data filed at 03/10/2019 0413 Gross per 24 hour  Intake 623.68 ml    Output 526 ml  Net 97.68 ml   Filed Weights   03/09/19 0940  Weight: 81.6 kg    Examination:  General exam: Appears calm and comfortable , chronically sick looking.  Not in any distress.  On room air. Respiratory system: Clear to auscultation. Respiratory effort normal. Cardiovascular system: S1 & S2 heard, RRR. No JVD, murmurs, rubs, gallops or clicks.  Does have some anasarca. Gastrointestinal system: Abdomen is nondistended, soft and nontender. No organomegaly or masses felt. Normal bowel sounds heard. Central nervous system: Alert and awake.  She is oriented x2-3.  She knows she is in the hospital.  She knows he fell and ambulance brought her to the hospital.  Lethargic. No focal neurological deficits. Extremities: Symmetric 5 x 5 power. Skin: No rashes, lesions or ulcers Psychiatry: Judgement and insight appear normal. Mood & affect anxious and flat.    Data Reviewed: I have personally reviewed following labs and imaging studies  CBC: Recent Labs  Lab 03/09/19 1016 03/10/19 0328  WBC 6.6 9.9  NEUTROABS 5.6  --   HGB 12.1 11.8*  HCT 37.6 36.0  MCV 95.2 94.2  PLT 94* 119*   Basic Metabolic Panel: Recent Labs  Lab 03/09/19 1016 03/10/19 0328  NA 139 143  K 4.1 5.0  CL 108 117*  CO2 16* 17*  GLUCOSE 109* 99  BUN 38* 34*  CREATININE 0.88 1.43*  CALCIUM 8.6* 7.8*   GFR: Estimated Creatinine Clearance: 29.7 mL/min (A) (by C-G formula based on SCr of 1.43 mg/dL (H)). Liver Function Tests: Recent Labs  Lab 03/09/19 1016 03/10/19 0328  AST 46* 40  ALT 50* 45*  ALKPHOS 132* 127*  BILITOT 0.6 0.7  PROT 5.8* 5.4*  ALBUMIN 2.7* 2.6*   No results for input(s): LIPASE, AMYLASE in the last 168 hours. No results for input(s): AMMONIA in the last 168 hours. Coagulation Profile: No results for input(s): INR, PROTIME in the last 168 hours. Cardiac Enzymes: Recent Labs  Lab 03/09/19 1016  CKTOTAL 194  TROPONINI <0.03   BNP (last 3 results) No results for  input(s): PROBNP in the last 8760 hours. HbA1C: No results for input(s): HGBA1C in the last 72 hours. CBG: No results for input(s): GLUCAP in the last 168 hours. Lipid Profile: No results for input(s): CHOL, HDL, LDLCALC, TRIG, CHOLHDL, LDLDIRECT in the last 72 hours. Thyroid Function Tests: Recent Labs    03/09/19 1803  TSH 1.746   Anemia Panel: No results for input(s): VITAMINB12, FOLATE, FERRITIN, TIBC, IRON, RETICCTPCT in the last 72 hours. Sepsis Labs: Recent Labs  Lab 03/09/19 1019 03/09/19 1519 03/09/19 1920  LATICACIDVEN 2.5* 1.4 1.5    Recent Results (from the past 240 hour(s))  Blood culture (routine x 2)     Status: None (Preliminary result)   Collection Time: 03/09/19 10:13 AM  Result Value Ref Range Status   Specimen Description BLOOD LEFT FOREARM  Final   Special Requests   Final    BOTTLES DRAWN AEROBIC AND ANAEROBIC Blood Culture adequate volume   Culture  Setup Time   Final    ANAEROBIC BOTTLE ONLY GRAM POSITIVE  RODS CRITICAL RESULT CALLED TO, READ BACK BY AND VERIFIED WITH: Karsten Ro [facility] 03/10/19 0101 JDW Performed at [facility], [place] [address] [place], [place] [address]    Culture PENDING  Incomplete   Report Status PENDING  Incomplete         Radiology Studies: Dg Chest 2 View  Result Date: 03/10/2019 CLINICAL DATA:  Abnormal lung sounds EXAM: CHEST - 2 VIEW COMPARISON:  03/09/2019 FINDINGS: Lungs are essentially clear. Mild eventration of the right hemidiaphragm. No pleural effusion or pneumothorax. The heart is normal in size. Lower thoracic/upper lumbar fixation hardware, incompletely visualized. Status post ORIF of the left lateral clavicle. Left shoulder arthroplasty. IMPRESSION: No evidence of acute cardiopulmonary disease. Electronically Signed   By: Julian Hy M.D.   On: 03/10/2019 06:49   Dg Chest 2 View  Result Date: 03/09/2019 CLINICAL DATA:  Weakness and fall. EXAM: CHEST - 2 VIEW COMPARISON:  06/26/2017 FINDINGS:  The heart size and mediastinal contours are within normal limits. There is no evidence of pulmonary edema, consolidation, pneumothorax, nodule or pleural fluid. Stable appearance of left clavicular reconstruction plate, left shoulder arthroplasty and spinal fusion rods. IMPRESSION: No active cardiopulmonary disease. Electronically Signed   By: Aletta Edouard M.D.   On: 03/09/2019 11:26   Ct Head Wo Contrast  Result Date: 03/09/2019 CLINICAL DATA:  Unwitnessed fall. EXAM: CT HEAD WITHOUT CONTRAST CT CERVICAL SPINE WITHOUT CONTRAST TECHNIQUE: Multidetector CT imaging of the head and cervical spine was performed following the standard protocol without intravenous contrast. Multiplanar CT image reconstructions of the cervical spine were also generated. COMPARISON:  CT scan of October 05, 2018. FINDINGS: CT HEAD FINDINGS Brain: Mild diffuse cortical atrophy is noted. Minimal chronic ischemic white matter disease is noted. No mass effect or midline shift is noted. Ventricular size is within normal limits. There is no evidence of mass lesion, hemorrhage or acute infarction. Vascular: No hyperdense vessel or unexpected calcification. Skull: Normal. Negative for fracture or focal lesion. Sinuses/Orbits: No acute finding. Other: None. CT CERVICAL SPINE FINDINGS Alignment: Normal. Skull base and vertebrae: No acute fracture. No primary bone lesion or focal pathologic process. Soft tissues and spinal canal: No prevertebral fluid or swelling. No visible canal hematoma. Disc levels: Severe degenerative disc disease is noted at C5-6 and C6-7. Anterior and posterior osteophyte formation is noted at these levels. Upper chest: Negative. Other: Degenerative changes are seen involving posterior facet joints bilaterally. IMPRESSION: Mild diffuse cortical atrophy. Minimal chronic ischemic white matter disease. No acute intracranial abnormality seen. Severe multilevel degenerative disc disease. No acute abnormality seen in the  cervical spine. Electronically Signed   By: Marijo Conception, M.D.   On: 03/09/2019 11:23   Ct Cervical Spine Wo Contrast  Result Date: 03/09/2019 CLINICAL DATA:  Unwitnessed fall. EXAM: CT HEAD WITHOUT CONTRAST CT CERVICAL SPINE WITHOUT CONTRAST TECHNIQUE: Multidetector CT imaging of the head and cervical spine was performed following the standard protocol without intravenous contrast. Multiplanar CT image reconstructions of the cervical spine were also generated. COMPARISON:  CT scan of October 05, 2018. FINDINGS: CT HEAD FINDINGS Brain: Mild diffuse cortical atrophy is noted. Minimal chronic ischemic white matter disease is noted. No mass effect or midline shift is noted. Ventricular size is within normal limits. There is no evidence of mass lesion, hemorrhage or acute infarction. Vascular: No hyperdense vessel or unexpected calcification. Skull: Normal. Negative for fracture or focal lesion. Sinuses/Orbits: No acute finding. Other: None. CT CERVICAL SPINE FINDINGS Alignment: Normal. Skull base and  vertebrae: No acute fracture. No primary bone lesion or focal pathologic process. Soft tissues and spinal canal: No prevertebral fluid or swelling. No visible canal hematoma. Disc levels: Severe degenerative disc disease is noted at C5-6 and C6-7. Anterior and posterior osteophyte formation is noted at these levels. Upper chest: Negative. Other: Degenerative changes are seen involving posterior facet joints bilaterally. IMPRESSION: Mild diffuse cortical atrophy. Minimal chronic ischemic white matter disease. No acute intracranial abnormality seen. Severe multilevel degenerative disc disease. No acute abnormality seen in the cervical spine. Electronically Signed   By: Marijo Conception, M.D.   On: 03/09/2019 11:23        Scheduled Meds:  aspirin EC  81 mg Oral Daily   levothyroxine  75 mcg Oral QAC breakfast   metoprolol succinate  25 mg Oral Daily   pantoprazole  40 mg Oral Daily   QUEtiapine  25 mg  Oral QHS   simvastatin  10 mg Oral QHS   sodium chloride flush  3 mL Intravenous Q12H   venlafaxine XR  37.5 mg Oral Q breakfast   Continuous Infusions:  cefTRIAXone (ROCEPHIN)  IV     lactated ringers     vancomycin       LOS: 1 day    Time spent: 25 minutes    Barb Merino, MD [facility] Pager [phone]  If 7PM-7AM, please contact night-coverage [email or web address] Password [facility] 03/10/2019, 9:17 AM

## 2019-03-11 ENCOUNTER — Inpatient Hospital Stay (HOSPITAL_COMMUNITY): Payer: Medicare Other

## 2019-03-11 DIAGNOSIS — Y92009 Unspecified place in unspecified non-institutional (private) residence as the place of occurrence of the external cause: Secondary | ICD-10-CM

## 2019-03-11 DIAGNOSIS — W19XXXA Unspecified fall, initial encounter: Secondary | ICD-10-CM

## 2019-03-11 DIAGNOSIS — R748 Abnormal levels of other serum enzymes: Secondary | ICD-10-CM

## 2019-03-11 DIAGNOSIS — R4182 Altered mental status, unspecified: Secondary | ICD-10-CM

## 2019-03-11 LAB — CBC WITH DIFFERENTIAL/PLATELET
Abs Immature Granulocytes: 0.03 10*3/uL (ref 0.00–0.07)
Basophils Absolute: 0 10*3/uL (ref 0.0–0.1)
Basophils Relative: 1 %
Eosinophils Absolute: 0.1 10*3/uL (ref 0.0–0.5)
Eosinophils Relative: 1 %
HCT: 34.4 % — ABNORMAL LOW (ref 36.0–46.0)
Hemoglobin: 11.3 g/dL — ABNORMAL LOW (ref 12.0–15.0)
Immature Granulocytes: 0 %
Lymphocytes Relative: 11 %
Lymphs Abs: 0.9 10*3/uL (ref 0.7–4.0)
MCH: 32 pg (ref 26.0–34.0)
MCHC: 32.8 g/dL (ref 30.0–36.0)
MCV: 97.5 fL (ref 80.0–100.0)
MONO ABS: 0.8 10*3/uL (ref 0.1–1.0)
Monocytes Relative: 10 %
Neutro Abs: 5.9 10*3/uL (ref 1.7–7.7)
Neutrophils Relative %: 77 %
Platelets: 90 10*3/uL — ABNORMAL LOW (ref 150–400)
RBC: 3.53 MIL/uL — ABNORMAL LOW (ref 3.87–5.11)
RDW: 13.7 % (ref 11.5–15.5)
WBC: 7.7 10*3/uL (ref 4.0–10.5)
nRBC: 0 % (ref 0.0–0.2)

## 2019-03-11 LAB — BASIC METABOLIC PANEL
Anion gap: 11 (ref 5–15)
BUN: 24 mg/dL — AB (ref 8–23)
CO2: 16 mmol/L — ABNORMAL LOW (ref 22–32)
Calcium: 8.5 mg/dL — ABNORMAL LOW (ref 8.9–10.3)
Chloride: 115 mmol/L — ABNORMAL HIGH (ref 98–111)
Creatinine, Ser: 1.23 mg/dL — ABNORMAL HIGH (ref 0.44–1.00)
GFR calc Af Amer: 48 mL/min — ABNORMAL LOW (ref >60)
GFR calc non Af Amer: 41 mL/min — ABNORMAL LOW (ref >60)
Glucose, Bld: 110 mg/dL — ABNORMAL HIGH (ref 70–99)
Potassium: 4.1 mmol/L (ref 3.5–5.1)
Sodium: 142 mmol/L (ref 135–145)

## 2019-03-11 LAB — CORTISOL: Cortisol, Plasma: 13.4 ug/dL

## 2019-03-11 LAB — MAGNESIUM: Magnesium: 1.8 mg/dL (ref 1.7–2.4)

## 2019-03-11 MED ORDER — METOPROLOL SUCCINATE ER 50 MG PO TB24
75.0000 mg | ORAL_TABLET | Freq: Every day | ORAL | Status: DC
  Administered 2019-03-11 – 2019-03-13 (×3): 75 mg via ORAL
  Filled 2019-03-11 (×3): qty 1

## 2019-03-11 MED ORDER — METOPROLOL SUCCINATE ER 50 MG PO TB24: 50.0000 mg | ORAL_TABLET | Freq: Every day | ORAL | Status: DC

## 2019-03-11 MED ORDER — VITAMIN B-1 100 MG PO TABS
100.0000 mg | ORAL_TABLET | Freq: Every day | ORAL | Status: DC
  Administered 2019-03-11 – 2019-03-13 (×3): 100 mg via ORAL
  Filled 2019-03-11 (×3): qty 1

## 2019-03-11 MED ORDER — HYDRALAZINE HCL 50 MG PO TABS
50.0000 mg | ORAL_TABLET | Freq: Three times a day (TID) | ORAL | Status: DC
  Administered 2019-03-11 – 2019-03-13 (×6): 50 mg via ORAL
  Filled 2019-03-11 (×6): qty 1

## 2019-03-11 MED ORDER — FOLIC ACID 1 MG PO TABS
1.0000 mg | ORAL_TABLET | Freq: Every day | ORAL | Status: DC
  Administered 2019-03-11 – 2019-03-13 (×3): 1 mg via ORAL
  Filled 2019-03-11 (×3): qty 1

## 2019-03-11 MED ORDER — DILTIAZEM HCL-DEXTROSE 100-5 MG/100ML-% IV SOLN (PREMIX)
5.0000 mg/h | INTRAVENOUS | Status: DC
  Administered 2019-03-11: 5 mg/h via INTRAVENOUS
  Administered 2019-03-11: 7.5 mg/h via INTRAVENOUS
  Filled 2019-03-11 (×2): qty 100

## 2019-03-11 MED ORDER — DILTIAZEM HCL 25 MG/5ML IV SOLN
10.0000 mg | Freq: Once | INTRAVENOUS | Status: AC
  Administered 2019-03-11: 10 mg via INTRAVENOUS
  Filled 2019-03-11: qty 5

## 2019-03-11 MED ORDER — METOPROLOL TARTRATE 5 MG/5ML IV SOLN
5.0000 mg | INTRAVENOUS | Status: AC | PRN
  Administered 2019-03-11 (×2): 5 mg via INTRAVENOUS
  Filled 2019-03-11: qty 5

## 2019-03-11 MED ORDER — METOPROLOL TARTRATE 5 MG/5ML IV SOLN
5.0000 mg | Freq: Four times a day (QID) | INTRAVENOUS | Status: DC | PRN
  Administered 2019-03-11 (×2): 5 mg via INTRAVENOUS
  Filled 2019-03-11 (×2): qty 5

## 2019-03-11 MED ORDER — DILTIAZEM LOAD VIA INFUSION
10.0000 mg | Freq: Once | INTRAVENOUS | Status: AC
  Administered 2019-03-11: 10 mg via INTRAVENOUS
  Filled 2019-03-11 (×2): qty 10

## 2019-03-11 MED ORDER — CHLORDIAZEPOXIDE HCL 5 MG PO CAPS
10.0000 mg | ORAL_CAPSULE | Freq: Two times a day (BID) | ORAL | Status: DC | PRN
  Administered 2019-03-12: 10 mg via ORAL
  Filled 2019-03-11: qty 2

## 2019-03-11 MED ORDER — DILTIAZEM HCL 25 MG/5ML IV SOLN
10.0000 mg | Freq: Four times a day (QID) | INTRAVENOUS | Status: DC | PRN
  Filled 2019-03-11 (×2): qty 5

## 2019-03-11 MED ORDER — WHITE PETROLATUM EX OINT
TOPICAL_OINTMENT | CUTANEOUS | Status: AC
  Administered 2019-03-11: 0.2
  Filled 2019-03-11: qty 28.35

## 2019-03-11 MED ORDER — SODIUM CHLORIDE 0.9 % IV SOLN
3.0000 g | Freq: Three times a day (TID) | INTRAVENOUS | Status: DC
  Administered 2019-03-11 – 2019-03-13 (×8): 3 g via INTRAVENOUS
  Filled 2019-03-11 (×11): qty 3

## 2019-03-11 NOTE — Significant Event (Addendum)
Pt was NSR until 1:37am. Received a call from [place] at 1:40a that pt was ST, when I went to check on pt she was in A-FiB, EKG was done. Dr was paged. Dr. ordered a dose of Metoprolol tartrate (Lopressor)  5 mg IV, every 5 min until heart rate was lower than 120. Hold at 2 doses. 2 doses of Metoprolol tartrate (Lopressor) 5 mg IV was giving, heart rate continued to be in the 120's-130's. Dr. was paged again, new order of diltiazem (CARDIZEM) injection 10 mg IV was ordered. Pt stated she was not having any chest pains or SOB during the time of AFIB. Diltiazem (CARDIZEM) injection 10 mg IV was at 2:40am. [employer] was contacted after 20 min of giving diltiazem (CARDIZEM) injection 10 mg IV, to get new heart rhythm, pt was still in Afib at 2:57a. Dr was paged just to notified Dr that the heart rate was in the 100-110's, BP 139/86 (99) . Pt was asleep during this time with no issues of chest pain or SOB. CIWA was done as well. Will continue to monitor.

## 2019-03-11 NOTE — Progress Notes (Addendum)
PROGRESS NOTE    [name] [name]  UYQ:[id number] DOB: 02-22-1936 DOA: 03/09/2019 PCP: [name], MD    Brief Narrative:  Patient is a 83 year old female with history of previous strokes, coronary artery disease, paroxysmal atrial fibrillation not on anticoagulation secondary to fall, hypertension, heart failure with preserved ejection fraction, on multiple antianxiety medications, hypothyroidism who was brought to the hospital where she was found falling on the porch and laying on floor for a while.  EMS found her hypothermic.  Patient was confused.  No clear source of infection.  She has been admitted and treated as sepsis.  1 of the blood cultures positive for gram-positive rods.   Subjective:  Patient in bed, appears comfortable, denies any headache, no fever, no chest pain or pressure, no shortness of breath , no abdominal pain. No focal weakness.   Assessment & Plan:   Hypothermia: Was likely due to accidental polypharmacy with narcotic and benzo overdose along with alcohol use, TSH and random cortisol stable, hypothermia with supportive care has resolved.  Clinically no signs of sepsis.  Could have had mild aspiration pneumonia but clinically stable.  Counseled to quit alcohol, taper down narcotics and benzodiazepines as much as possible.  PT.  Advance activity.  Prepare for discharge possibly tomorrow.  Acute kidney injury: Due to dehydration resolved with IV fluids.  Altered mental status: No focal neurological deficit.  CT scan of the head neck and a skeletal survey was negative.  Due to accidental polypharmacy along with possible alcohol withdrawal, stable minimize medications.  CIWA protocol along with scheduled Librium.  Abnormal liver function test: Probably due to #1.  Repeat test with improvement.  Chronic diastolic heart failure 63% on recent echocardiogram: Euvolemic on examination.  Continue to hold diuretics.  Hypertension history of paroxysmal atrial fibrillation and  sinus tachycardia: Beta blocker dose increased for better control.  The Lopressor and Cardizem ordered for rate control if needed.  Mali vas 2 score will be 3 but she is not on any anticoagulation due to fall risk.  On baby aspirin.  ? Asp PNA - clinically stable, Unasyn, stop all other antibiotics, speech eval.   Previous MD had discussed the case with patient's daughter and discussed in details.  She stated that patient never had altered mentation in the past.  She states that patient was mostly sedentary, however with no other preceding illness.  Advance activities today with PT OT.  Continue to monitor.  DVT prophylaxis: SCDs Code Status: Full code Family Communication: Previous MD daughter over the phone.  Her husband is deceased. Disposition Plan: Home with home health care versus SNF.   Consultants:   None.  Procedures:   None.  Antimicrobials:   Vancomycin 03/09/2019  Rocephin 03/09/2019   Objective: Vitals:   03/11/19 0744 03/11/19 0805 03/11/19 0816 03/11/19 0844  BP: (!) 150/80 (!) 154/84  (!) 141/66  Pulse:   100 98  Resp: (!) 25 (!) 26 (!) 22 20  Temp:      TempSrc:      SpO2:   96% 93%  Weight:      Height:        Intake/Output Summary (Last 24 hours) at 03/11/2019 0918 Last data filed at 03/11/2019 0622 Gross per 24 hour  Intake 232.71 ml  Output 1300 ml  Net -1067.29 ml   Filed Weights   03/09/19 0940  Weight: 81.6 kg    Examination:  Awake Alert, Oriented X 2, No new F.N deficits, Normal affect [place].AT,PERRAL Supple  Neck,No JVD, No cervical lymphadenopathy appriciated.  Symmetrical Chest wall movement, Good air movement bilaterally, CTAB RRR,No Gallops, Rubs or new Murmurs, No Parasternal Heave +ve B.Sounds, Abd Soft, No tenderness, No organomegaly appriciated, No rebound - guarding or rigidity. No Cyanosis, Clubbing or edema, No new Rash or bruise   Data Reviewed: I have personally reviewed following labs and imaging  studies  CBC: Recent Labs  Lab 03/09/19 1016 03/10/19 0328 03/11/19 0353  WBC 6.6 9.9 7.7  NEUTROABS 5.6  --  5.9  HGB 12.1 11.8* 11.3*  HCT 37.6 36.0 34.4*  MCV 95.2 94.2 97.5  PLT 94* 116* 90*   Basic Metabolic Panel: Recent Labs  Lab 03/09/19 1016 03/10/19 0328 03/11/19 0353  NA 139 143 142  K 4.1 5.0 4.1  CL 108 117* 115*  CO2 16* 17* 16*  GLUCOSE 109* 99 110*  BUN 38* 34* 24*  CREATININE 0.88 1.43* 1.23*  CALCIUM 8.6* 7.8* 8.5*  MG  --   --  1.8   GFR: Estimated Creatinine Clearance: 34.5 mL/min (A) (by C-G formula based on SCr of 1.23 mg/dL (H)). Liver Function Tests: Recent Labs  Lab 03/09/19 1016 03/10/19 0328  AST 46* 40  ALT 50* 45*  ALKPHOS 132* 127*  BILITOT 0.6 0.7  PROT 5.8* 5.4*  ALBUMIN 2.7* 2.6*   No results for input(s): LIPASE, AMYLASE in the last 168 hours. No results for input(s): AMMONIA in the last 168 hours. Coagulation Profile: No results for input(s): INR, PROTIME in the last 168 hours. Cardiac Enzymes: Recent Labs  Lab 03/09/19 1016  CKTOTAL 194  TROPONINI <0.03   BNP (last 3 results) No results for input(s): PROBNP in the last 8760 hours. HbA1C: No results for input(s): HGBA1C in the last 72 hours. CBG: No results for input(s): GLUCAP in the last 168 hours. Lipid Profile: No results for input(s): CHOL, HDL, LDLCALC, TRIG, CHOLHDL, LDLDIRECT in the last 72 hours. Thyroid Function Tests: Recent Labs    03/09/19 1803  TSH 1.746   Anemia Panel: No results for input(s): VITAMINB12, FOLATE, FERRITIN, TIBC, IRON, RETICCTPCT in the last 72 hours. Sepsis Labs: Recent Labs  Lab 03/09/19 1019 03/09/19 1519 03/09/19 1920  LATICACIDVEN 2.5* 1.4 1.5    Recent Results (from the past 240 hour(s))  Blood culture (routine x 2)     Status: None (Preliminary result)   Collection Time: 03/09/19 10:13 AM  Result Value Ref Range Status   Specimen Description BLOOD LEFT FOREARM  Final   Special Requests   Final    BOTTLES  DRAWN AEROBIC AND ANAEROBIC Blood Culture adequate volume   Culture  Setup Time   Final    ANAEROBIC BOTTLE ONLY GRAM POSITIVE RODS CRITICAL RESULT CALLED TO, READ BACK BY AND VERIFIED WITH: J LEDFORD [facility] 03/10/19 0101 JDW    Culture   Final    CULTURE REINCUBATED FOR BETTER GROWTH Performed at [facility], [place] [address] [place], [place] [address]    Report Status PENDING  Incomplete  Blood culture (routine x 2)     Status: None (Preliminary result)   Collection Time: 03/09/19 10:24 AM  Result Value Ref Range Status   Specimen Description BLOOD RIGHT HAND  Final   Special Requests   Final    BOTTLES DRAWN AEROBIC AND ANAEROBIC Blood Culture adequate volume   Culture   Final    NO GROWTH < 24 HOURS Performed at [facility], [place] [address] [place], [place] [address]  Report Status PENDING  Incomplete  Urine Culture     Status: None   Collection Time: 03/09/19 11:36 AM  Result Value Ref Range Status   Specimen Description URINE, CATHETERIZED  Final   Special Requests NONE  Final   Culture   Final    NO GROWTH Performed at [facility], [address] [place], [place] [address]    Report Status 03/10/2019 FINAL  Final         Radiology Studies: Dg Chest 2 View  Result Date: 03/10/2019 CLINICAL DATA:  Abnormal lung sounds EXAM: CHEST - 2 VIEW COMPARISON:  03/09/2019 FINDINGS: Lungs are essentially clear. Mild eventration of the right hemidiaphragm. No pleural effusion or pneumothorax. The heart is normal in size. Lower thoracic/upper lumbar fixation hardware, incompletely visualized. Status post ORIF of the left lateral clavicle. Left shoulder arthroplasty. IMPRESSION: No evidence of acute cardiopulmonary disease. Electronically Signed   By: Julian Hy M.D.   On: 03/10/2019 06:49   Dg Chest 2 View  Result Date: 03/09/2019 CLINICAL DATA:  Weakness and fall. EXAM: CHEST - 2 VIEW COMPARISON:  06/26/2017 FINDINGS: The heart size and mediastinal  contours are within normal limits. There is no evidence of pulmonary edema, consolidation, pneumothorax, nodule or pleural fluid. Stable appearance of left clavicular reconstruction plate, left shoulder arthroplasty and spinal fusion rods. IMPRESSION: No active cardiopulmonary disease. Electronically Signed   By: Aletta Edouard M.D.   On: 03/09/2019 11:26   Ct Head Wo Contrast  Result Date: 03/09/2019 CLINICAL DATA:  Unwitnessed fall. EXAM: CT HEAD WITHOUT CONTRAST CT CERVICAL SPINE WITHOUT CONTRAST TECHNIQUE: Multidetector CT imaging of the head and cervical spine was performed following the standard protocol without intravenous contrast. Multiplanar CT image reconstructions of the cervical spine were also generated. COMPARISON:  CT scan of October 05, 2018. FINDINGS: CT HEAD FINDINGS Brain: Mild diffuse cortical atrophy is noted. Minimal chronic ischemic white matter disease is noted. No mass effect or midline shift is noted. Ventricular size is within normal limits. There is no evidence of mass lesion, hemorrhage or acute infarction. Vascular: No hyperdense vessel or unexpected calcification. Skull: Normal. Negative for fracture or focal lesion. Sinuses/Orbits: No acute finding. Other: None. CT CERVICAL SPINE FINDINGS Alignment: Normal. Skull base and vertebrae: No acute fracture. No primary bone lesion or focal pathologic process. Soft tissues and spinal canal: No prevertebral fluid or swelling. No visible canal hematoma. Disc levels: Severe degenerative disc disease is noted at C5-6 and C6-7. Anterior and posterior osteophyte formation is noted at these levels. Upper chest: Negative. Other: Degenerative changes are seen involving posterior facet joints bilaterally. IMPRESSION: Mild diffuse cortical atrophy. Minimal chronic ischemic white matter disease. No acute intracranial abnormality seen. Severe multilevel degenerative disc disease. No acute abnormality seen in the cervical spine. Electronically Signed    By: Marijo Conception, M.D.   On: 03/09/2019 11:23   Ct Cervical Spine Wo Contrast  Result Date: 03/09/2019 CLINICAL DATA:  Unwitnessed fall. EXAM: CT HEAD WITHOUT CONTRAST CT CERVICAL SPINE WITHOUT CONTRAST TECHNIQUE: Multidetector CT imaging of the head and cervical spine was performed following the standard protocol without intravenous contrast. Multiplanar CT image reconstructions of the cervical spine were also generated. COMPARISON:  CT scan of October 05, 2018. FINDINGS: CT HEAD FINDINGS Brain: Mild diffuse cortical atrophy is noted. Minimal chronic ischemic white matter disease is noted. No mass effect or midline shift is noted. Ventricular size is within normal limits. There is no evidence of mass lesion, hemorrhage or acute  infarction. Vascular: No hyperdense vessel or unexpected calcification. Skull: Normal. Negative for fracture or focal lesion. Sinuses/Orbits: No acute finding. Other: None. CT CERVICAL SPINE FINDINGS Alignment: Normal. Skull base and vertebrae: No acute fracture. No primary bone lesion or focal pathologic process. Soft tissues and spinal canal: No prevertebral fluid or swelling. No visible canal hematoma. Disc levels: Severe degenerative disc disease is noted at C5-6 and C6-7. Anterior and posterior osteophyte formation is noted at these levels. Upper chest: Negative. Other: Degenerative changes are seen involving posterior facet joints bilaterally. IMPRESSION: Mild diffuse cortical atrophy. Minimal chronic ischemic white matter disease. No acute intracranial abnormality seen. Severe multilevel degenerative disc disease. No acute abnormality seen in the cervical spine. Electronically Signed   By: Marijo Conception, M.D.   On: 03/09/2019 11:23   Dg Chest Port 1 View  Result Date: 03/11/2019 CLINICAL DATA:  Shortness of breath EXAM: PORTABLE CHEST 1 VIEW COMPARISON:  03/10/2019 FINDINGS: Mild left infrahilar/lower lobe opacity suspected, although equivocal. Increased interstitial  markings without frank interstitial edema. No pleural effusion or pneumothorax. Cardiomegaly. Status post ORIF of the left clavicle. Thoracolumbar fixation hardware, incompletely visualized. IMPRESSION: Mild left infrahilar/lower lobe opacity suspected, although equivocal. Pneumonia is not excluded in the appropriate clinical setting. Electronically Signed   By: Julian Hy M.D.   On: 03/11/2019 06:57   Scheduled Meds:  aspirin EC  81 mg Oral Daily   FLUoxetine  20 mg Oral Daily   folic acid  1 mg Oral Daily   levothyroxine  75 mcg Oral QAC breakfast   metoprolol succinate  75 mg Oral Daily   pantoprazole  40 mg Oral Daily   QUEtiapine  25 mg Oral QHS   simvastatin  10 mg Oral QHS   sodium chloride flush  3 mL Intravenous Q12H   thiamine  100 mg Oral Daily   venlafaxine XR  37.5 mg Oral Q breakfast   Continuous Infusions:  ampicillin-sulbactam (UNASYN) IV 3 g (03/11/19 0725)     LOS: 2 days    Time spent: 25 minutes   Signature  Lala Lund M.D on 03/11/2019 at 9:18 AM   -  To page go to [email or web address]

## 2019-03-11 NOTE — Progress Notes (Signed)
After getting patient oob to chair, patient's HR sustaining 130-150's in atrial fibrillation. Dr. Candiss Norse made aware of situation and placed orders for cardizem push and drip. Will start when meds get sent from pharmacy.   Hiram Comber, RN 03/11/2019 12:13 PM

## 2019-03-11 NOTE — Progress Notes (Signed)
Pt's heart rate increased to 130's. BP increased to 204/182. Dr was paged with new vitals.  Manual BP was done, BP was 182/72 at 5:30a. Rapid [facility] was contacted, they stated to wait and give Dr. Kennon Holter time to response with an option, since she was asymptomatic . Dr responded with an ordered of diltiazem (Cardizem) Injection 10 mg IV. Before meds were given heart rate dropped to 120's, BP at 5:43 was 166/98 (120). Pt reported no chest pain or SOB, just a slight headache.   Diltiazem (Cardizem) Injection 10 mg IV was pushed at 6:22a  After waiting 20 min pt vitals were 161/79

## 2019-03-11 NOTE — Progress Notes (Signed)
Pharmacy Antibiotic Note  [name] [name] is a 83 y.o. female admitted on 03/09/2019 with fall.  Pharmacy has been consulted for Unasyn dosing for possible aspiration PNA. WBC WNL. Mild bump in Scr.   Plan: Unasyn 3g IV q8h Trend WBC, temp, renal function   Height: 5' (152.4 cm) Weight: 180 lb (81.6 kg) IBW/kg (Calculated) : 45.5  Temp (24hrs), Avg:98.2 F (36.8 C), Min:98 F (36.7 C), Max:98.4 F (36.9 C)  Recent Labs  Lab 03/09/19 1016 03/09/19 1019 03/09/19 1519 03/09/19 1920 03/10/19 0328 03/11/19 0353  WBC 6.6  --   --   --  9.9 7.7  CREATININE 0.88  --   --   --  1.43* 1.23*  LATICACIDVEN  --  2.5* 1.4 1.5  --   --     Estimated Creatinine Clearance: 34.5 mL/min (A) (by C-G formula based on SCr of 1.23 mg/dL (H)).    Allergies  Allergen Reactions  . Tetanus Toxoids Swelling and Other (See Comments)    Arm (site) became swollen as a child  . Snake Antivenin [Antivenin Crotalidae Polyvalent] Other (See Comments)    Just can't take per daughter  . Yellow Dyes (Non-Tartrazine) Itching and Other (See Comments)    Makes patient nervous       [name] 03/11/2019 6:41 AM

## 2019-03-11 NOTE — Progress Notes (Signed)
   03/11/19 1756 03/11/19 1826  Vitals  BP (!) 173/96 (!) 173/120  MAP (mmHg) 120 137   5mg  of IV Metoprolol administered for first BP. Repeat BP as noted. Cardizem currently at 7.5mg /hr. Dr. Candiss Norse paged and verbal orders received for PO Hydralazine. Will continue to monitor.  Hiram Comber, RN 03/11/2019 6:33 PM

## 2019-03-11 NOTE — Progress Notes (Signed)
Patient tolerated being oob in chair for 4 hours with steady getting her back to bed. Rate currently controlled on cardizem in the 90's. Blood pressure sustaining. Will continue to monitor.  Hiram Comber, RN 03/11/2019 2:14 PM

## 2019-03-12 LAB — URINALYSIS, ROUTINE W REFLEX MICROSCOPIC
Bilirubin Urine: NEGATIVE
Glucose, UA: NEGATIVE mg/dL
Ketones, ur: NEGATIVE mg/dL
Nitrite: NEGATIVE
Protein, ur: 30 mg/dL — AB
Specific Gravity, Urine: 1.015 (ref 1.005–1.030)
WBC, UA: >50 WBC/hpf — ABNORMAL HIGH (ref 0–5)
pH: 5 (ref 5.0–8.0)

## 2019-03-12 LAB — BASIC METABOLIC PANEL
Anion gap: 10 (ref 5–15)
BUN: 22 mg/dL (ref 8–23)
CHLORIDE: 113 mmol/L — AB (ref 98–111)
CO2: 18 mmol/L — ABNORMAL LOW (ref 22–32)
Calcium: 8.7 mg/dL — ABNORMAL LOW (ref 8.9–10.3)
Creatinine, Ser: 1.18 mg/dL — ABNORMAL HIGH (ref 0.44–1.00)
GFR calc Af Amer: 50 mL/min — ABNORMAL LOW (ref >60)
GFR calc non Af Amer: 44 mL/min — ABNORMAL LOW (ref >60)
Glucose, Bld: 113 mg/dL — ABNORMAL HIGH (ref 70–99)
Potassium: 3.5 mmol/L (ref 3.5–5.1)
SODIUM: 141 mmol/L (ref 135–145)

## 2019-03-12 LAB — CULTURE, BLOOD (ROUTINE X 2): SPECIAL REQUESTS: ADEQUATE

## 2019-03-12 LAB — CBC
HCT: 35.7 % — ABNORMAL LOW (ref 36.0–46.0)
HEMOGLOBIN: 11.3 g/dL — AB (ref 12.0–15.0)
MCH: 30.5 pg (ref 26.0–34.0)
MCHC: 31.7 g/dL (ref 30.0–36.0)
MCV: 96.5 fL (ref 80.0–100.0)
Platelets: 101 10*3/uL — ABNORMAL LOW (ref 150–400)
RBC: 3.7 MIL/uL — ABNORMAL LOW (ref 3.87–5.11)
RDW: 13.4 % (ref 11.5–15.5)
WBC: 11.8 10*3/uL — ABNORMAL HIGH (ref 4.0–10.5)
nRBC: 0 % (ref 0.0–0.2)

## 2019-03-12 MED ORDER — CHLORDIAZEPOXIDE HCL 5 MG PO CAPS
10.0000 mg | ORAL_CAPSULE | Freq: Three times a day (TID) | ORAL | Status: DC
  Administered 2019-03-12 – 2019-03-13 (×5): 10 mg via ORAL
  Filled 2019-03-12 (×5): qty 2

## 2019-03-12 MED ORDER — DILTIAZEM HCL 60 MG PO TABS
90.0000 mg | ORAL_TABLET | Freq: Three times a day (TID) | ORAL | Status: DC
  Administered 2019-03-12 – 2019-03-13 (×5): 90 mg via ORAL
  Filled 2019-03-12 (×5): qty 2

## 2019-03-12 NOTE — Progress Notes (Signed)
Physical Therapy Treatment Patient Details Name: [name] [name] MRN: [id number] DOB: [birth year] Today's Date: 03/12/2019    History of Present Illness Pt is an 83 yo female s/p fall found down by daughter awaiting 7 hours to be assisted by neighbors; pt found to be hypothermic, confused and very weak. Pt PMHx: alcoholism, back sx, paraoxysmal Afib, falls, HTN, CHF, hypothyroidism, anxiety.    PT Comments    Pt progressing towards goals. Able to perform pre-gait activity in stedy this session. Requiring min-modA +2 to perform mobility tasks this session. Would like to attempt mobility tasks with RW next session. Current recommendations appropriate. Will continue to follow acutely to maximize functional mobility independence and safety.    Follow Up Recommendations  SNF     Equipment Recommendations  None recommended by PT    Recommendations for Other Services       Precautions / Restrictions Precautions Precautions: Fall Restrictions Weight Bearing Restrictions: No    Mobility  Bed Mobility Overal bed mobility: Needs Assistance Bed Mobility: Supine to Sit   Sidelying to sit: Mod assist;+2 for physical assistance       General bed mobility comments: Mod A +2 for LE assist and trunk elevation. Also required assist to scoot hips to EOB.   Transfers Overall transfer level: Needs assistance   Transfers: Sit to/from Stand Sit to Stand: Min assist;+2 physical assistance         General transfer comment: Min A +2 to stand with use of stedy. Heavy use of UEs. Able to maintain static standing in steady for ~3 mins for clean up following BM.   Ambulation/Gait                 Stairs             Wheelchair Mobility    Modified Rankin (Stroke Patients Only)       Balance Overall balance assessment: Needs assistance Sitting-balance support: Feet supported Sitting balance-Leahy Scale: Fair     Standing balance support: Bilateral upper extremity  supported;During functional activity Standing balance-Leahy Scale: Poor Standing balance comment: REliant on BUE support                             Cognition Arousal/Alertness: Awake/alert Behavior During Therapy: WFL for tasks assessed/performed Overall Cognitive Status: Impaired/Different from baseline Area of Impairment: Attention;Memory;Following commands;Safety/judgement;Problem solving;Awareness                   Current Attention Level: Selective Memory: Decreased short-term memory Following Commands: Follows one step commands with increased time Safety/Judgement: Decreased awareness of safety;Decreased awareness of deficits Awareness: Emergent Problem Solving: Slow processing;Requires verbal cues General Comments: Pt not requiring as many cues this session to perform tasks. Was very interactive throughout.       Exercises General Exercises - Lower Extremity Long Arc Quad: AROM;Both;10 reps;Seated Hip Flexion/Marching: AROM;Both;10 reps;Standing(using stedy)    General Comments        Pertinent Vitals/Pain Pain Assessment: Faces Faces Pain Scale: No hurt    Home Living                      Prior Function            PT Goals (current goals can now be found in the care plan section) Acute Rehab PT Goals Patient Stated Goal: "to go home" PT Goal Formulation: With patient Time For Goal Achievement: 03/24/19 Potential to Achieve  Goals: Fair Progress towards PT goals: Progressing toward goals    Frequency    Min 2X/week      PT Plan Current plan remains appropriate    Co-evaluation              AM-PAC PT "6 Clicks" Mobility   Outcome Measure  Help needed turning from your back to your side while in a flat bed without using bedrails?: Total Help needed moving from lying on your back to sitting on the side of a flat bed without using bedrails?: Total Help needed moving to and from a bed to a chair (including a  wheelchair)?: Total Help needed standing up from a chair using your arms (e.g., wheelchair or bedside chair)?: A Lot Help needed to walk in hospital room?: Total Help needed climbing 3-5 steps with a railing? : Total 6 Click Score: 7    End of Session Equipment Utilized During Treatment: Gait belt Activity Tolerance: Patient tolerated treatment well Patient left: in chair;with call bell/phone within reach;with chair alarm set Nurse Communication: Mobility status PT Visit Diagnosis: Other abnormalities of gait and mobility (R26.89);Muscle weakness (generalized) (M62.81)     Time: 0315-9458 PT Time Calculation (min) (ACUTE ONLY): 20 min  Charges:  $Therapeutic Activity: 8-22 mins                     Leighton Ruff, PT, DPT  Acute Rehabilitation Services  Pager: [phone] Office: [phone]    Rudean Hitt 03/12/2019, 12:41 PM

## 2019-03-12 NOTE — Progress Notes (Signed)
Occupational Therapy Treatment Patient Details Name: [name] [name] MRN: [id number] DOB: 05/06/1936 Today's Date: 03/12/2019    History of present illness Pt is an 83 yo female s/p fall found down by daughter awaiting 7 hours to be assisted by neighbors; pt found to be hypothermic, confused and very weak. Pt PMHx: alcoholism, back sx, paraoxysmal Afib, falls, HTN, CHF, hypothyroidism, anxiety.   OT comments  Pt progressing toward goals. She required varying +2 min-mod assist for multiple sit<>stands at recliner and the performed stand pivot transfer to bed.  In standing, she requires +2 total assist for back peri care due to loose stool. HR increasing from 80 to 100s with transfer but returns to 80-82 in less than one minute. Continue to recommend CIR for discharge planning. Will continue to follow acutely.   Follow Up Recommendations  CIR;Supervision/Assistance - 24 hour    Equipment Recommendations  Other (comment)(defer to next venue)    Recommendations for Other Services      Precautions / Restrictions Precautions Precautions: Fall Restrictions Weight Bearing Restrictions: No       Mobility Bed Mobility Overal bed mobility: Needs Assistance Bed Mobility: Sit to Supine   Sidelying to sit: Mod assist;+2 for physical assistance   Sit to supine: Min assist   General bed mobility comments: Assist to bring LEs back into bed and to control descent of trunk into bed.   Transfers Overall transfer level: Needs assistance Equipment used: Rolling walker (2 wheeled) Transfers: Sit to/from [facility] Sit to Stand: +2 physical assistance;Min assist;Mod assist Stand pivot transfers: +2 physical assistance;Min assist       General transfer comment: +2 min assist to perform sit<>stand x 2 and mod assist with final/third sit<>stand.  Performs pivot from recliner to bed with +2 min assist for safety.     Balance Overall balance assessment: Needs  assistance Sitting-balance support: No upper extremity supported;Feet supported Sitting balance-Leahy Scale: Fair     Standing balance support: Bilateral upper extremity supported;During functional activity Standing balance-Leahy Scale: Poor Standing balance comment: using RW as Bilateral UE support                           ADL either performed or assessed with clinical judgement   ADL Overall ADL's : Needs assistance/impaired                         Toilet Transfer: +2 for safety/equipment;Moderate assistance;Minimal assistance Toilet Transfer Details (indicate cue type and reason): Simulated with stand pivot transfer from recliner to bed.  Toileting- Clothing Manipulation and Hygiene: +2 for safety/equipment;Total assistance;Sit to/from stand Toileting - Clothing Manipulation Details (indicate cue type and reason): One person assist with balance while second person assist with back peri care due to loose stool.     Functional mobility during ADLs: +2 for safety/equipment;Minimal assistance;Rolling walker;Cueing for sequencing;Cueing for safety General ADL Comments: Pt up in recliner upon OT arrival.  Pt requesting to get up to relieve pressure on her bottom.  In standing, she requires total assist to complete back peri care (+2 assist for balance and for peri care).      Vision       Perception     Praxis      Cognition Arousal/Alertness: Awake/alert Behavior During Therapy: WFL for tasks assessed/performed Overall Cognitive Status: Impaired/Different from baseline Area of Impairment: Attention;Following commands;Safety/judgement  Current Attention Level: Selective Memory: Decreased short-term memory Following Commands: Follows one step commands with increased time Safety/Judgement: Decreased awareness of safety;Decreased awareness of deficits Awareness: Emergent Problem Solving: Slow processing;Requires verbal cues General  Comments: Pt not requiring as many cues this session to perform tasks. Was very interactive throughout.         Exercises    Shoulder Instructions       General Comments HR increasing from 80s to 100s during sit<>stand with stand pivot transfer. Returns to 80s in less than 1 minute once she is seated.     Pertinent Vitals/ Pain       Pain Assessment: No/denies pain Faces Pain Scale: No hurt  Home Living                                          Prior Functioning/Environment              Frequency  Min 3X/week        Progress Toward Goals  OT Goals(current goals can now be found in the care plan section)  Progress towards OT goals: Progressing toward goals  Acute Rehab OT Goals Patient Stated Goal: "to go home" OT Goal Formulation: With patient Time For Goal Achievement: 03/24/19 Potential to Achieve Goals: Fair ADL Goals Pt Will Perform Eating: with modified independence Pt Will Perform Grooming: with modified independence Pt Will Transfer to Toilet: with min assist Pt Will Perform Toileting - Clothing Manipulation and hygiene: with min assist;sit to/from stand Pt/caregiver will Perform Home Exercise Program: Both right and left upper extremity;With written HEP provided;With Supervision Additional ADL Goal #1: Pt will perform 10 mins of sustained functional tasks in order to increase activity tolerance with intermittent standing.  Plan Discharge plan remains appropriate    Co-evaluation                 AM-PAC OT "6 Clicks" Daily Activity     Outcome Measure   Help from another person eating meals?: A Little Help from another person taking care of personal grooming?: A Lot Help from another person toileting, which includes using toliet, bedpan, or urinal?: A Lot Help from another person bathing (including washing, rinsing, drying)?: A Lot Help from another person to put on and taking off regular upper body clothing?: A Lot Help from  another person to put on and taking off regular lower body clothing?: A Lot 6 Click Score: 13    End of Session Equipment Utilized During Treatment: Gait belt;Rolling walker  OT Visit Diagnosis: Unsteadiness on feet (R26.81);Muscle weakness (generalized) (M62.81)   Activity Tolerance Patient tolerated treatment well   Patient Left in bed;with call bell/phone within reach;with nursing/sitter in room   Nurse Communication Mobility status        Time: 0938-1829 OT Time Calculation (min): 18 min  Charges: OT General Charges $OT Visit: 1 Visit OT Treatments $Therapeutic Activity: 8-22 mins     Darrol Jump OTR/L [place] [phone] 03/12/2019, 3:37 PM

## 2019-03-12 NOTE — NC FL2 (Signed)
[place] LEVEL OF CARE SCREENING TOOL     IDENTIFICATION  Patient Name: [name] [name] Birthdate: [birth year] Sex: female Admission Date (Current Location): 03/09/2019  [facility] and [place] Number:  Herbalist and Address:  [facility] [place], [place] [address] [place], [place] [address]      Provider Number: [id number]  Attending Physician Name and Address:  Thurnell Lose, MD  Relative Name and Phone Number:  [name], daughter, [phone]    Current Level of Care: Hospital Recommended Level of Care: [place] Prior Approval Number:    Date Approved/Denied:   PASRR Number: [id number]  Discharge Plan: SNF    Current Diagnoses: Patient Active Problem List   Diagnosis Date Noted  . Hypothermia 03/09/2019  . Elevated liver enzymes 03/09/2019  . Thrombocytopenia ([place]) 03/09/2019  . Hyperkalemia 07/23/2017  . AKI (acute kidney injury) ([place]) 07/23/2017  . 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 Fall 06/26/2017  . Intractable nausea and vomiting 03/11/2017  . Acute lower UTI 03/11/2017  . Carotid stenosis 12/28/2016  . Sedative, hypnotic, or anxiolytic withdrawal delirium 11/15/2016  . Paroxysmal atrial fibrillation (HCC)   . Cerebrovascular disease   . Fever   . Acute encephalopathy 11/10/2016  . Hypothyroidism 11/10/2016  . Hypertensive urgency 11/10/2016  . Altered mental state 11/10/2016  . Seizure ([place]) 11/10/2016  . Chronic diastolic CHF (congestive heart failure) ([place])   . Left carotid stenosis 10/29/2016  . S/P total knee replacement 03/10/2016  . 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 Ileus ([place]) 09/05/2014  . Leukocytosis 09/05/2014  . Vomiting 09/04/2014  . Pulmonary edema 09/04/2014  . Hypokalemia 08/28/2014  . Enteritis due to Clostridium difficile 08/27/2014  . 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 Protein calorie malnutrition ([place])  08/24/2014  . Obstructive jaundice 08/23/2014  . Acute cholangitis 08/23/2014  . ARF (acute renal failure) ([place]) 08/23/2014  . Coagulopathy ([place]) 08/23/2014  . Back pain   . Arthritis   . Hypertension   . GERD (gastroesophageal reflux disease)   . Anxiety   . IBS (irritable bowel syndrome)   . Hernia of Right posterior flank 04/18/2012    Orientation RESPIRATION BLADDER Height & Weight     Self, Time, Situation, Place  Normal Incontinent, External catheter Weight: 180 lb (81.6 kg) Height:  5' (152.4 cm)  BEHAVIORAL SYMPTOMS/MOOD NEUROLOGICAL BOWEL NUTRITION STATUS      Continent Diet(Please see DC Summary)  AMBULATORY STATUS COMMUNICATION OF NEEDS Skin   Extensive Assist Verbally Normal                       Personal Care Assistance Level of Assistance  Bathing, Feeding, Dressing Bathing Assistance: Maximum assistance Feeding assistance: Limited assistance Dressing Assistance: Limited assistance     Functional Limitations Info  Sight, Hearing, Speech Sight Info: Adequate Hearing Info: Adequate Speech Info: Adequate    SPECIAL CARE FACTORS FREQUENCY  OT (By licensed OT), PT (By licensed PT)     PT Frequency: 5x/week OT Frequency: 3x/week            Contractures Contractures Info: Not present    Additional Factors Info  Code Status, Allergies, Psychotropic Code Status Info: Full Allergies Info: Tetanus Toxoids, Snake Antivenin Antivenin Crotalidae Polyvalent, Yellow Dyes (Non-tartrazine) Psychotropic Info: Prozac, Effexor, Seroquel         Current Medications (03/12/2019):  This is the current hospital active medication list Current  Facility-Administered Medications  Medication Dose Route Frequency Provider Last Rate Last Dose  . acetaminophen (TYLENOL) tablet 650 mg  650 mg Oral Q6H PRN Emeterio Reeve, DO   650 mg at 03/12/19 1413  . Ampicillin-Sulbactam (UNASYN) 3 g in sodium chloride 0.9 % 100 mL IVPB  3 g Intravenous Q8H Erenest Blank, RPH  200 mL/hr at 03/12/19 1419 3 g at 03/12/19 1419  . aspirin EC tablet 81 mg  81 mg Oral Daily Emeterio Reeve, DO   81 mg at 03/12/19 0910  . chlordiazePOXIDE (LIBRIUM) capsule 10 mg  10 mg Oral TID Thurnell Lose, MD   10 mg at 03/12/19 0915  . diltiazem (CARDIZEM) injection 10 mg  10 mg Intravenous Q6H PRN Thurnell Lose, MD      . diltiazem (CARDIZEM) tablet 90 mg  90 mg Oral Q8H Thurnell Lose, MD   90 mg at 03/12/19 1406  . FLUoxetine (PROZAC) capsule 20 mg  20 mg Oral Daily Barb Merino, MD   20 mg at 03/12/19 0910  . folic acid (FOLVITE) tablet 1 mg  1 mg Oral Daily Thurnell Lose, MD   1 mg at 03/12/19 0909  . hydrALAZINE (APRESOLINE) tablet 50 mg  50 mg Oral Q8H Thurnell Lose, MD   50 mg at 03/12/19 1405  . hyoscyamine (LEVBID) 0.375 MG 12 hr tablet 0.375 mg  0.375 mg Oral BID PRN Emeterio Reeve, DO      . levothyroxine (SYNTHROID, LEVOTHROID) tablet 75 mcg  75 mcg Oral QAC breakfast Emeterio Reeve, DO   75 mcg at 03/12/19 0908  . LORazepam (ATIVAN) injection 2-3 mg  2-3 mg Intravenous Q1H PRN Emeterio Reeve, DO   2 mg at 03/11/19 1842  . metoprolol succinate (TOPROL-XL) 24 hr tablet 75 mg  75 mg Oral Daily Thurnell Lose, MD   75 mg at 03/12/19 0909  . metoprolol tartrate (LOPRESSOR) injection 5 mg  5 mg Intravenous Q6H PRN Thurnell Lose, MD   5 mg at 03/11/19 1803  . ondansetron (ZOFRAN) tablet 8 mg  8 mg Oral Q8H PRN Emeterio Reeve, DO      . pantoprazole (PROTONIX) EC tablet 40 mg  40 mg Oral Daily Emeterio Reeve, DO   40 mg at 03/12/19 0910  . QUEtiapine (SEROQUEL) tablet 25 mg  25 mg Oral QHS Emeterio Reeve, DO   25 mg at 03/11/19 2144  . simvastatin (ZOCOR) tablet 10 mg  10 mg Oral QHS Emeterio Reeve, DO   10 mg at 03/11/19 2144  . sodium chloride flush (NS) 0.9 % injection 3 mL  3 mL Intravenous Q12H Emeterio Reeve, DO   3 mL at 03/11/19 2143  . thiamine (VITAMIN B-1) tablet 100 mg  100 mg Oral Daily Thurnell Lose, MD    100 mg at 03/12/19 0910  . venlafaxine XR (EFFEXOR-XR) 24 hr capsule 37.5 mg  37.5 mg Oral Q breakfast Emeterio Reeve, DO   37.5 mg at 03/12/19 0907     Discharge Medications: Please see discharge summary for a list of discharge medications.  Relevant Imaging Results:  Relevant Lab Results:   Additional Information SS#: [id number]  Benard Halsted, LCSW

## 2019-03-12 NOTE — Progress Notes (Signed)
PROGRESS NOTE    [name] [name]  JQZ:[id number] DOB: 31-Oct-1936 DOA: 03/09/2019 PCP: [name], MD    Brief Narrative:  Patient is a 83 year old female with history of previous strokes, coronary artery disease, paroxysmal atrial fibrillation not on anticoagulation secondary to fall, hypertension, heart failure with preserved ejection fraction, on multiple antianxiety medications, hypothyroidism who was brought to the hospital where she was found falling on the porch and laying on floor for a while.  EMS found her hypothermic.  Patient was confused.  No clear source of infection.  She has been admitted and treated as sepsis.  1 of the blood cultures positive for gram-positive rods.   Subjective:  Patient in bed, appears comfortable, denies any headache, no fever, no chest pain or pressure, no shortness of breath , no abdominal pain. No focal weakness.  Assessment & Plan:   Hypothermia: Was likely due to accidental polypharmacy with narcotic and benzo overdose along with alcohol use, TSH and random cortisol stable, hypothermia with supportive care has resolved.  Clinically no signs of sepsis.  Could have had mild aspiration pneumonia but clinically stable.  Counseled to quit alcohol, taper down narcotics and benzodiazepines as much as possible.  Seen by PT will require SNF.  Clinically much improved.  Acute kidney injury: Due to dehydration resolved with IV fluids.  Altered mental status: No focal neurological deficit.  CT scan of the head neck and a skeletal survey was negative.  Due to accidental polypharmacy along with possible alcohol withdrawal, stable minimize medications.  CIWA protocol along with scheduled Librium.  Abnormal liver function test: Probably due to #1.  Repeat test with improvement.  Chronic diastolic heart failure 62% on recent echocardiogram: Euvolemic on examination.  Continue to hold diuretics.  Hypertension history of paroxysmal atrial fibrillation and sinus  tachycardia: Beta blocker dose increased for better control.  The Lopressor and Cardizem ordered for rate control if needed.  Mali vas 2 score will be 3 but she is not on any anticoagulation due to fall risk.  On baby aspirin.  ? Asp PNA - clinically stable, Unasyn, stop all other antibiotics, speech eval.   Previous MD had discussed the case with patient's daughter and discussed in details.  She stated that patient never had altered mentation in the past.  She states that patient was mostly sedentary, however with no other preceding illness.  Advance activities today with PT OT.  Continue to monitor.  DVT prophylaxis: SCDs Code Status: Full code Family Communication: Previous MD daughter over the phone.  Her husband is deceased. Disposition Plan: SNF.   Consultants:   None.  Procedures:   None.  Anti-infectives (From admission, onward)   Start     Dose/Rate Route Frequency Ordered Stop   03/11/19 0645  Ampicillin-Sulbactam (UNASYN) 3 g in sodium chloride 0.9 % 100 mL IVPB     3 g 200 mL/hr over 30 Minutes Intravenous Every 8 hours 03/11/19 0641     03/10/19 1630  cefTRIAXone (ROCEPHIN) 2 g in sodium chloride 0.9 % 100 mL IVPB  Status:  Discontinued     2 g 200 mL/hr over 30 Minutes Intravenous Every 24 hours 03/09/19 1612 03/11/19 0641   03/10/19 1500  vancomycin (VANCOCIN) 1,250 mg in sodium chloride 0.9 % 250 mL IVPB  Status:  Discontinued     1,250 mg 166.7 mL/hr over 90 Minutes Intravenous Every 24 hours 03/09/19 1530 03/11/19 0627   03/09/19 1615  cefTRIAXone (ROCEPHIN) 1 g in sodium chloride 0.9 %  100 mL IVPB     1 g 200 mL/hr over 30 Minutes Intravenous STAT 03/09/19 1612 03/09/19 2016   03/09/19 1515  cefTRIAXone (ROCEPHIN) 1 g in sodium chloride 0.9 % 100 mL IVPB  Status:  Discontinued     1 g 200 mL/hr over 30 Minutes Intravenous Every 24 hours 03/09/19 1502 03/09/19 1613   03/09/19 1500  vancomycin (VANCOCIN) 1,500 mg in sodium chloride 0.9 % 500 mL IVPB     1,500  mg 250 mL/hr over 120 Minutes Intravenous  Once 03/09/19 1456 03/10/19 0132      Objective: Vitals:   03/12/19 0700 03/12/19 0800 03/12/19 0900 03/12/19 0928  BP: 138/82 115/61 116/80   Pulse:    91  Resp:  (!) 21 (!) 22 19  Temp:    (!) 97.5 F (36.4 C)  TempSrc:    Oral  SpO2:  94% 94% 93%  Weight:      Height:        Intake/Output Summary (Last 24 hours) at 03/12/2019 1001 Last data filed at 03/12/2019 0206 Gross per 24 hour  Intake 112.93 ml  Output 1200 ml  Net -1087.07 ml   Filed Weights   03/09/19 0940  Weight: 81.6 kg    Examination:  Awake Alert, Oriented X 2, No new F.N deficits, Normal affect [place].AT,PERRAL Supple Neck,No JVD, No cervical lymphadenopathy appriciated.  Symmetrical Chest wall movement, Good air movement bilaterally, CTAB RRR,No Gallops, Rubs or new Murmurs, No Parasternal Heave +ve B.Sounds, Abd Soft, No tenderness, No organomegaly appriciated, No rebound - guarding or rigidity. No Cyanosis, Clubbing or edema, No new Rash or bruise   Data Reviewed: I have personally reviewed following labs and imaging studies  CBC: Recent Labs  Lab 03/09/19 1016 03/10/19 0328 03/11/19 0353 03/12/19 0516  WBC 6.6 9.9 7.7 11.8*  NEUTROABS 5.6  --  5.9  --   HGB 12.1 11.8* 11.3* 11.3*  HCT 37.6 36.0 34.4* 35.7*  MCV 95.2 94.2 97.5 96.5  PLT 94* 116* 90* 767*   Basic Metabolic Panel: Recent Labs  Lab 03/09/19 1016 03/10/19 0328 03/11/19 0353 03/12/19 0516  NA 139 143 142 141  K 4.1 5.0 4.1 3.5  CL 108 117* 115* 113*  CO2 16* 17* 16* 18*  GLUCOSE 109* 99 110* 113*  BUN 38* 34* 24* 22  CREATININE 0.88 1.43* 1.23* 1.18*  CALCIUM 8.6* 7.8* 8.5* 8.7*  MG  --   --  1.8  --    GFR: Estimated Creatinine Clearance: 36 mL/min (A) (by C-G formula based on SCr of 1.18 mg/dL (H)). Liver Function Tests: Recent Labs  Lab 03/09/19 1016 03/10/19 0328  AST 46* 40  ALT 50* 45*  ALKPHOS 132* 127*  BILITOT 0.6 0.7  PROT 5.8* 5.4*  ALBUMIN 2.7* 2.6*     No results for input(s): LIPASE, AMYLASE in the last 168 hours. No results for input(s): AMMONIA in the last 168 hours. Coagulation Profile: No results for input(s): INR, PROTIME in the last 168 hours. Cardiac Enzymes: Recent Labs  Lab 03/09/19 1016  CKTOTAL 194  TROPONINI <0.03   BNP (last 3 results) No results for input(s): PROBNP in the last 8760 hours. HbA1C: No results for input(s): HGBA1C in the last 72 hours. CBG: No results for input(s): GLUCAP in the last 168 hours. Lipid Profile: No results for input(s): CHOL, HDL, LDLCALC, TRIG, CHOLHDL, LDLDIRECT in the last 72 hours. Thyroid Function Tests: Recent Labs    03/09/19 1803  TSH 1.746  Anemia Panel: No results for input(s): VITAMINB12, FOLATE, FERRITIN, TIBC, IRON, RETICCTPCT in the last 72 hours. Sepsis Labs: Recent Labs  Lab 03/09/19 1019 03/09/19 1519 03/09/19 1920  LATICACIDVEN 2.5* 1.4 1.5    Recent Results (from the past 240 hour(s))  Blood culture (routine x 2)     Status: None (Preliminary result)   Collection Time: 03/09/19 10:13 AM  Result Value Ref Range Status   Specimen Description BLOOD LEFT FOREARM  Final   Special Requests   Final    BOTTLES DRAWN AEROBIC AND ANAEROBIC Blood Culture adequate volume   Culture  Setup Time   Final    ANAEROBIC BOTTLE ONLY GRAM POSITIVE RODS CRITICAL RESULT CALLED TO, READ BACK BY AND VERIFIED WITH: J LEDFORD [facility] 03/10/19 0101 JDW    Culture   Final    GRAM POSITIVE RODS HOLDING FOR POSSIBLE ANAEROBE Performed at [facility], [place] [address], [place], [place] [address]    Report Status PENDING  Incomplete  Blood culture (routine x 2)     Status: None (Preliminary result)   Collection Time: 03/09/19 10:24 AM  Result Value Ref Range Status   Specimen Description BLOOD RIGHT HAND  Final   Special Requests   Final    BOTTLES DRAWN AEROBIC AND ANAEROBIC Blood Culture adequate volume   Culture   Final    NO GROWTH 2 DAYS Performed at [facility], [place] [address] [place], [place] [address]    Report Status PENDING  Incomplete  Urine Culture     Status: None   Collection Time: 03/09/19 11:36 AM  Result Value Ref Range Status   Specimen Description URINE, CATHETERIZED  Final   Special Requests NONE  Final   Culture   Final    NO GROWTH Performed at [facility], [place] [address] [place], [place] [address]    Report Status 03/10/2019 FINAL  Final     Radiology Studies: Dg Chest Port 1 View  Result Date: 03/11/2019 CLINICAL DATA:  Shortness of breath EXAM: PORTABLE CHEST 1 VIEW COMPARISON:  03/10/2019 FINDINGS: Mild left infrahilar/lower lobe opacity suspected, although equivocal. Increased interstitial markings without frank interstitial edema. No pleural effusion or pneumothorax. Cardiomegaly. Status post ORIF of the left clavicle. Thoracolumbar fixation hardware, incompletely visualized. IMPRESSION: Mild left infrahilar/lower lobe opacity suspected, although equivocal. Pneumonia is not excluded in the appropriate clinical setting. Electronically Signed   By: Julian Hy M.D.   On: 03/11/2019 06:57   Scheduled Meds:  aspirin EC  81 mg Oral Daily   chlordiazePOXIDE  10 mg Oral TID   diltiazem  90 mg Oral Q8H   FLUoxetine  20 mg Oral Daily   folic acid  1 mg Oral Daily   hydrALAZINE  50 mg Oral Q8H   levothyroxine  75 mcg Oral QAC breakfast   metoprolol succinate  75 mg Oral Daily   pantoprazole  40 mg Oral Daily   QUEtiapine  25 mg Oral QHS   simvastatin  10 mg Oral QHS   sodium chloride flush  3 mL Intravenous Q12H   thiamine  100 mg Oral Daily   venlafaxine XR  37.5 mg Oral Q breakfast   Continuous Infusions:  ampicillin-sulbactam (UNASYN) IV 3 g (03/12/19 0601)     LOS: 3 days    Time spent: 25 minutes   Signature  Lala Lund M.D on 03/12/2019 at 10:01 AM   -  To page go to [email or web address]

## 2019-03-12 NOTE — TOC Initial Note (Addendum)
Transition of Care [facility]) - Initial/Assessment Note    Patient Details  Name: [name] [name] MRN: [id number] Date of Birth: March 17, 1936  Transition of Care [facility]) CM/SW Contact:    Benard Halsted, LCSW Phone Number: 03/12/2019, 2:19 PM  Clinical Narrative:                 CSW received consult for possible SNF placement at time of discharge. CSW spoke with patient regarding PT recommendation of SNF placement at time of discharge. Patient reported that patient lives with her daughter's support. Patient expressed understanding of PT recommendation and may be agreeable to CIR, SNF placement, or home health at time of discharge. She requests to speak with her daughter before deciding. Patient expressed being hopeful for rehab and to feel better soon. No further questions reported at this time. CSW to continue to follow and assist with discharge planning needs.   Expected Discharge Plan: IP Rehab Facility Barriers to Discharge: Continued Medical Work up   Patient Goals and CMS Choice Patient states their goals for this hospitalization and ongoing recovery are:: Rehab CMS Medicare.gov Compare Post Acute Care list provided to:: Patient Choice offered to / list presented to : Patient  Expected Discharge Plan and Services Expected Discharge Plan: [place] In-house Referral: Clinical Social Work Discharge Planning Services: NA Post Acute Care Choice: [place] arrangements for the past 2 months: Apartment                 DME Arranged: N/A DME Agency: NA HH Arranged: NA [place] Agency: NA  Prior Living Arrangements/Services Living arrangements for the past 2 months: Apartment Lives with:: Self Patient language and need for interpreter reviewed:: Yes Do you feel safe going back to the place where you live?: Yes      Need for Family Participation in Patient Care: Yes (Comment) Care giver support system in place?: Yes (comment) Current home  services: DME Criminal Activity/Legal Involvement Pertinent to Current Situation/Hospitalization: No - Comment as needed  Activities of Daily Living Home Assistive Devices/Equipment: None ADL Screening (condition at time of admission) Patient's cognitive ability adequate to safely complete daily activities?: No Is the patient deaf or have difficulty hearing?: No Does the patient have difficulty seeing, even when wearing glasses/contacts?: No Does the patient have difficulty concentrating, remembering, or making decisions?: Yes Patient able to express need for assistance with ADLs?: Yes Does the patient have difficulty dressing or bathing?: Yes Independently performs ADLs?: No Communication: Independent Dressing (OT): Needs assistance Is this a change from baseline?: Pre-admission baseline Grooming: Needs assistance Is this a change from baseline?: Pre-admission baseline Feeding: Independent Bathing: Needs assistance Is this a change from baseline?: Pre-admission baseline Toileting: Needs assistance Is this a change from baseline?: Pre-admission baseline In/Out Bed: Needs assistance Is this a change from baseline?: Pre-admission baseline Walks in Home: Needs assistance Is this a change from baseline?: Pre-admission baseline Does the patient have difficulty walking or climbing stairs?: Yes Weakness of Legs: Both Weakness of Arms/Hands: None  Permission Sought/Granted Permission sought to share information with : Facility Sport and exercise psychologist, Family Supports Permission granted to share information with : Yes, Verbal Permission Granted  Share Information with NAME: [name]  Permission granted to share info w AGENCY: SNFs  Permission granted to share info w Relationship: Daughter  Permission granted to share info w Contact Information: [phone]  Emotional Assessment Appearance:: Appears stated age Attitude/Demeanor/Rapport: Gracious Affect (typically observed):  Accepting, Appropriate Orientation: : Oriented  to Self, Oriented to Place, Oriented to  Time, Oriented to Situation Alcohol / Substance Use: Alcohol Use Psych Involvement: No (comment)  Admission diagnosis:  Confusion [R41.0] Abnormal lung sounds [R09.89] Fall in home, initial encounter [W19.XXXA, Y92.009] Hypothermia due to exposure [T68.XXXA] Hypothermia [id number]] Patient Active Problem List   Diagnosis Date Noted  . Hypothermia 03/09/2019  . Elevated liver enzymes 03/09/2019  . Thrombocytopenia ([place]) 03/09/2019  . Hyperkalemia 07/23/2017  . AKI (acute kidney injury) ([place]) 07/23/2017  . 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 Fall 06/26/2017  . Intractable nausea and vomiting 03/11/2017  . Acute lower UTI 03/11/2017  . Carotid stenosis 12/28/2016  . Sedative, hypnotic, or anxiolytic withdrawal delirium 11/15/2016  . Paroxysmal atrial fibrillation (H[facility])   . Cerebrovascular disease   . Fever   . Acute encephalopathy 11/10/2016  . Hypothyroidism 11/10/2016  . Hypertensive urgency 11/10/2016  . Altered mental state 11/10/2016  . Seizure ([place]) 11/10/2016  . Chronic diastolic CHF (congestive heart failure) ([place])   . Left carotid stenosis 10/29/2016  . S/P total knee replacement 03/10/2016  . 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 Ileus ([place]) 09/05/2014  . Leukocytosis 09/05/2014  . Vomiting 09/04/2014  . Pulmonary edema 09/04/2014  . Hypokalemia 08/28/2014  . Enteritis due to Clostridium difficile 08/27/2014  . 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 Protein calorie malnutrition ([place]) 08/24/2014  . Obstructive jaundice 08/23/2014  . Acute cholangitis 08/23/2014  . ARF (acute renal failure) ([place]) 08/23/2014  . Coagulopathy ([place]) 08/23/2014  . Back pain   . Arthritis   . Hypertension   . GERD (gastroesophageal reflux disease)   . Anxiety   . IBS (irritable bowel syndrome)   . Hernia of Right posterior flank 04/18/2012    PCP:  [name], MD Pharmacy:   [facility] [place] [address] Phone: [phone] Fax: [phone]     Social Determinants of Health (SDOH) Interventions    Readmission Risk Interventions Readmission Risk Prevention Plan 03/12/2019  Transportation Screening Complete  PCP or Specialist Appt within 3-5 Days Complete  HRI or [place] Complete  Social Work Consult for [place] Planning/Counseling Complete  Palliative Care Screening Not Applicable  Medication Review Press photographer) Complete  Some recent data might be hidden

## 2019-03-12 NOTE — Progress Notes (Signed)
Inpatient Rehabilitation Admissions Coordinator  Inpatient Rehab Consult received. I met with patient at the bedside for rehabilitation assessment. I also contacted her daughter by phone.We discussed goals and expectations of an inpatient rehab admission.  Daughter is concerned that pt had dramatic decrease in mobility last week. Was normally sedentary, Mod I on RW, wears depends and is incontinent. Patient grieving the death of her spouse from December. I would like to see how pt progresses with therapy over the next 24 hrs and possibly admit to CIR when medically ready for d/c.   , RN, MSN Rehab Admissions Coordinator [phone] 03/12/2019 1:33 PM' 

## 2019-03-13 ENCOUNTER — Encounter (HOSPITAL_COMMUNITY): Payer: Self-pay | Admitting: *Deleted

## 2019-03-13 ENCOUNTER — Inpatient Hospital Stay (HOSPITAL_COMMUNITY)
Admission: RE | Admit: 2019-03-13 | Discharge: 2019-04-03 | DRG: 945 | Disposition: A | Discharge status: Home Health | Payer: Medicare Other | Source: Intra-hospital | Attending: Physical Medicine & Rehabilitation | Admitting: Physical Medicine & Rehabilitation

## 2019-03-13 ENCOUNTER — Other Ambulatory Visit: Payer: Self-pay

## 2019-03-13 ENCOUNTER — Encounter (HOSPITAL_COMMUNITY): Payer: Self-pay | Admitting: Physical Medicine and Rehabilitation

## 2019-03-13 DIAGNOSIS — J690 Pneumonitis due to inhalation of food and vomit: Secondary | ICD-10-CM

## 2019-03-13 DIAGNOSIS — A419 Sepsis, unspecified organism: Secondary | ICD-10-CM

## 2019-03-13 DIAGNOSIS — D696 Thrombocytopenia, unspecified: Secondary | ICD-10-CM | POA: Diagnosis present

## 2019-03-13 DIAGNOSIS — D62 Acute posthemorrhagic anemia: Secondary | ICD-10-CM

## 2019-03-13 DIAGNOSIS — G9341 Metabolic encephalopathy: Secondary | ICD-10-CM | POA: Diagnosis not present

## 2019-03-13 DIAGNOSIS — M6282 Rhabdomyolysis: Secondary | ICD-10-CM | POA: Diagnosis not present

## 2019-03-13 DIAGNOSIS — I480 Paroxysmal atrial fibrillation: Secondary | ICD-10-CM | POA: Diagnosis present

## 2019-03-13 DIAGNOSIS — N390 Urinary tract infection, site not specified: Secondary | ICD-10-CM | POA: Diagnosis not present

## 2019-03-13 DIAGNOSIS — R5381 Other malaise: Principal | ICD-10-CM | POA: Diagnosis present

## 2019-03-13 DIAGNOSIS — T796XXS Traumatic ischemia of muscle, sequela: Secondary | ICD-10-CM | POA: Diagnosis not present

## 2019-03-13 DIAGNOSIS — I130 Hypertensive heart and chronic kidney disease with heart failure and stage 1 through stage 4 chronic kidney disease, or unspecified chronic kidney disease: Secondary | ICD-10-CM | POA: Diagnosis present

## 2019-03-13 DIAGNOSIS — E785 Hyperlipidemia, unspecified: Secondary | ICD-10-CM | POA: Diagnosis present

## 2019-03-13 DIAGNOSIS — Z79890 Hormone replacement therapy (postmenopausal): Secondary | ICD-10-CM

## 2019-03-13 DIAGNOSIS — R400 Somnolence: Secondary | ICD-10-CM

## 2019-03-13 DIAGNOSIS — Z887 Allergy status to serum and vaccine status: Secondary | ICD-10-CM

## 2019-03-13 DIAGNOSIS — I10 Essential (primary) hypertension: Secondary | ICD-10-CM | POA: Diagnosis not present

## 2019-03-13 DIAGNOSIS — K449 Diaphragmatic hernia without obstruction or gangrene: Secondary | ICD-10-CM | POA: Diagnosis present

## 2019-03-13 DIAGNOSIS — E46 Unspecified protein-calorie malnutrition: Secondary | ICD-10-CM | POA: Diagnosis not present

## 2019-03-13 DIAGNOSIS — I6782 Cerebral ischemia: Secondary | ICD-10-CM | POA: Diagnosis not present

## 2019-03-13 DIAGNOSIS — Z8673 Personal history of transient ischemic attack (TIA), and cerebral infarction without residual deficits: Secondary | ICD-10-CM | POA: Diagnosis not present

## 2019-03-13 DIAGNOSIS — A4151 Sepsis due to Escherichia coli [E. coli]: Secondary | ICD-10-CM | POA: Diagnosis not present

## 2019-03-13 DIAGNOSIS — L659 Nonscarring hair loss, unspecified: Secondary | ICD-10-CM | POA: Diagnosis present

## 2019-03-13 DIAGNOSIS — Z91048 Other nonmedicinal substance allergy status: Secondary | ICD-10-CM

## 2019-03-13 DIAGNOSIS — J9811 Atelectasis: Secondary | ICD-10-CM | POA: Diagnosis not present

## 2019-03-13 DIAGNOSIS — K219 Gastro-esophageal reflux disease without esophagitis: Secondary | ICD-10-CM | POA: Diagnosis present

## 2019-03-13 DIAGNOSIS — Z6830 Body mass index (BMI) 30.0-30.9, adult: Secondary | ICD-10-CM

## 2019-03-13 DIAGNOSIS — R7989 Other specified abnormal findings of blood chemistry: Secondary | ICD-10-CM | POA: Diagnosis not present

## 2019-03-13 DIAGNOSIS — R0602 Shortness of breath: Secondary | ICD-10-CM | POA: Diagnosis not present

## 2019-03-13 DIAGNOSIS — Z96612 Presence of left artificial shoulder joint: Secondary | ICD-10-CM | POA: Diagnosis present

## 2019-03-13 DIAGNOSIS — Z801 Family history of malignant neoplasm of trachea, bronchus and lung: Secondary | ICD-10-CM

## 2019-03-13 DIAGNOSIS — Z96652 Presence of left artificial knee joint: Secondary | ICD-10-CM | POA: Diagnosis present

## 2019-03-13 DIAGNOSIS — L4050 Arthropathic psoriasis, unspecified: Secondary | ICD-10-CM | POA: Diagnosis present

## 2019-03-13 DIAGNOSIS — I5033 Acute on chronic diastolic (congestive) heart failure: Secondary | ICD-10-CM | POA: Diagnosis present

## 2019-03-13 DIAGNOSIS — N179 Acute kidney failure, unspecified: Secondary | ICD-10-CM | POA: Diagnosis present

## 2019-03-13 DIAGNOSIS — K580 Irritable bowel syndrome with diarrhea: Secondary | ICD-10-CM | POA: Diagnosis present

## 2019-03-13 DIAGNOSIS — T68XXXA Hypothermia, initial encounter: Secondary | ICD-10-CM | POA: Diagnosis not present

## 2019-03-13 DIAGNOSIS — N189 Chronic kidney disease, unspecified: Secondary | ICD-10-CM | POA: Diagnosis present

## 2019-03-13 DIAGNOSIS — Z818 Family history of other mental and behavioral disorders: Secondary | ICD-10-CM

## 2019-03-13 DIAGNOSIS — R1312 Dysphagia, oropharyngeal phase: Secondary | ICD-10-CM | POA: Diagnosis not present

## 2019-03-13 DIAGNOSIS — I5032 Chronic diastolic (congestive) heart failure: Secondary | ICD-10-CM | POA: Diagnosis not present

## 2019-03-13 DIAGNOSIS — J90 Pleural effusion, not elsewhere classified: Secondary | ICD-10-CM | POA: Diagnosis not present

## 2019-03-13 DIAGNOSIS — R531 Weakness: Secondary | ICD-10-CM | POA: Diagnosis present

## 2019-03-13 DIAGNOSIS — R51 Headache: Secondary | ICD-10-CM | POA: Diagnosis not present

## 2019-03-13 DIAGNOSIS — E039 Hypothyroidism, unspecified: Secondary | ICD-10-CM | POA: Diagnosis present

## 2019-03-13 DIAGNOSIS — R509 Fever, unspecified: Secondary | ICD-10-CM

## 2019-03-13 DIAGNOSIS — I739 Peripheral vascular disease, unspecified: Secondary | ICD-10-CM | POA: Diagnosis present

## 2019-03-13 DIAGNOSIS — R001 Bradycardia, unspecified: Secondary | ICD-10-CM

## 2019-03-13 DIAGNOSIS — Z8249 Family history of ischemic heart disease and other diseases of the circulatory system: Secondary | ICD-10-CM

## 2019-03-13 DIAGNOSIS — R918 Other nonspecific abnormal finding of lung field: Secondary | ICD-10-CM | POA: Diagnosis not present

## 2019-03-13 DIAGNOSIS — Z7982 Long term (current) use of aspirin: Secondary | ICD-10-CM

## 2019-03-13 DIAGNOSIS — F419 Anxiety disorder, unspecified: Secondary | ICD-10-CM | POA: Diagnosis present

## 2019-03-13 DIAGNOSIS — R1310 Dysphagia, unspecified: Secondary | ICD-10-CM | POA: Diagnosis not present

## 2019-03-13 DIAGNOSIS — R4182 Altered mental status, unspecified: Secondary | ICD-10-CM | POA: Diagnosis not present

## 2019-03-13 DIAGNOSIS — F1010 Alcohol abuse, uncomplicated: Secondary | ICD-10-CM

## 2019-03-13 DIAGNOSIS — K5900 Constipation, unspecified: Secondary | ICD-10-CM | POA: Diagnosis not present

## 2019-03-13 LAB — CBC
HCT: 32 % — ABNORMAL LOW (ref 36.0–46.0)
Hemoglobin: 10.1 g/dL — ABNORMAL LOW (ref 12.0–15.0)
MCH: 31.2 pg (ref 26.0–34.0)
MCHC: 31.6 g/dL (ref 30.0–36.0)
MCV: 98.8 fL (ref 80.0–100.0)
Platelets: 105 10*3/uL — ABNORMAL LOW (ref 150–400)
RBC: 3.24 MIL/uL — ABNORMAL LOW (ref 3.87–5.11)
RDW: 13.4 % (ref 11.5–15.5)
WBC: 8.1 10*3/uL (ref 4.0–10.5)
nRBC: 0.2 % (ref 0.0–0.2)

## 2019-03-13 LAB — BASIC METABOLIC PANEL
Anion gap: 10 (ref 5–15)
BUN: 20 mg/dL (ref 8–23)
CO2: 18 mmol/L — ABNORMAL LOW (ref 22–32)
CREATININE: 1.33 mg/dL — AB (ref 0.44–1.00)
Calcium: 8.4 mg/dL — ABNORMAL LOW (ref 8.9–10.3)
Chloride: 114 mmol/L — ABNORMAL HIGH (ref 98–111)
GFR calc Af Amer: 44 mL/min — ABNORMAL LOW (ref >60)
GFR calc non Af Amer: 38 mL/min — ABNORMAL LOW (ref >60)
Glucose, Bld: 108 mg/dL — ABNORMAL HIGH (ref 70–99)
Potassium: 3.4 mmol/L — ABNORMAL LOW (ref 3.5–5.1)
SODIUM: 142 mmol/L (ref 135–145)

## 2019-03-13 MED ORDER — BISACODYL 10 MG RE SUPP: 10.0000 mg | Freq: Every day | RECTAL | Status: DC | PRN

## 2019-03-13 MED ORDER — AMOXICILLIN-POT CLAVULANATE 875-125 MG PO TABS: 1.0000 | ORAL_TABLET | Freq: Two times a day (BID) | ORAL | 0 refills | Status: DC

## 2019-03-13 MED ORDER — PROCHLORPERAZINE 25 MG RE SUPP: 12.5000 mg | Freq: Four times a day (QID) | RECTAL | Status: DC | PRN

## 2019-03-13 MED ORDER — CHLORDIAZEPOXIDE HCL 5 MG PO CAPS
10.0000 mg | ORAL_CAPSULE | Freq: Three times a day (TID) | ORAL | Status: DC
  Administered 2019-03-13 – 2019-03-29 (×48): 10 mg via ORAL
  Filled 2019-03-13 (×48): qty 2

## 2019-03-13 MED ORDER — ENOXAPARIN SODIUM 40 MG/0.4ML ~~LOC~~ SOLN
40.0000 mg | SUBCUTANEOUS | Status: DC
  Administered 2019-03-14 – 2019-03-26 (×13): 40 mg via SUBCUTANEOUS
  Filled 2019-03-13 (×13): qty 0.4

## 2019-03-13 MED ORDER — HYOSCYAMINE SULFATE ER 0.375 MG PO TB12
0.3750 mg | ORAL_TABLET | Freq: Two times a day (BID) | ORAL | Status: DC | PRN
  Administered 2019-03-14: 09:00:00 0.375 mg via ORAL
  Filled 2019-03-13 (×2): qty 1

## 2019-03-13 MED ORDER — VITAMIN B-1 100 MG PO TABS
100.0000 mg | ORAL_TABLET | Freq: Every day | ORAL | Status: DC
  Administered 2019-03-14 – 2019-04-03 (×21): 100 mg via ORAL
  Filled 2019-03-13 (×21): qty 1

## 2019-03-13 MED ORDER — VENLAFAXINE HCL ER 37.5 MG PO CP24
37.5000 mg | ORAL_CAPSULE | Freq: Every day | ORAL | Status: DC
  Administered 2019-03-14 – 2019-04-03 (×21): 37.5 mg via ORAL
  Filled 2019-03-13 (×21): qty 1

## 2019-03-13 MED ORDER — PROCHLORPERAZINE MALEATE 5 MG PO TABS: 5.0000 mg | ORAL_TABLET | Freq: Four times a day (QID) | ORAL | Status: DC | PRN

## 2019-03-13 MED ORDER — LEVOTHYROXINE SODIUM 75 MCG PO TABS
75.0000 ug | ORAL_TABLET | Freq: Every day | ORAL | Status: DC
  Administered 2019-03-14 – 2019-03-30 (×16): 75 ug via ORAL
  Filled 2019-03-13 (×17): qty 1

## 2019-03-13 MED ORDER — GERHARDT'S BUTT CREAM
TOPICAL_CREAM | Freq: Three times a day (TID) | CUTANEOUS | Status: DC
  Administered 2019-03-13 – 2019-03-18 (×16): via TOPICAL
  Administered 2019-03-19: 1 via TOPICAL
  Administered 2019-03-19: 22:00:00 via TOPICAL
  Administered 2019-03-19: 1 via TOPICAL
  Administered 2019-03-20 – 2019-03-22 (×7): via TOPICAL
  Administered 2019-03-22: 1 via TOPICAL
  Administered 2019-03-22 – 2019-04-03 (×32): via TOPICAL
  Filled 2019-03-13 (×2): qty 1

## 2019-03-13 MED ORDER — ACETAMINOPHEN 325 MG PO TABS
325.0000 mg | ORAL_TABLET | ORAL | Status: DC | PRN
  Administered 2019-03-13 – 2019-03-30 (×26): 650 mg via ORAL
  Filled 2019-03-13 (×26): qty 2

## 2019-03-13 MED ORDER — METOPROLOL SUCCINATE ER 50 MG PO TB24
75.0000 mg | ORAL_TABLET | Freq: Every day | ORAL | Status: DC
  Administered 2019-03-14 – 2019-04-03 (×18): 75 mg via ORAL
  Filled 2019-03-13 (×21): qty 1

## 2019-03-13 MED ORDER — ALUM & MAG HYDROXIDE-SIMETH 200-200-20 MG/5ML PO SUSP: 30.0000 mL | ORAL | Status: DC | PRN

## 2019-03-13 MED ORDER — QUETIAPINE FUMARATE 25 MG PO TABS
25.0000 mg | ORAL_TABLET | Freq: Every day | ORAL | Status: DC
  Administered 2019-03-13 – 2019-04-02 (×20): 25 mg via ORAL
  Filled 2019-03-13 (×21): qty 1

## 2019-03-13 MED ORDER — DILTIAZEM HCL 90 MG PO TABS: 90.0000 mg | ORAL_TABLET | Freq: Two times a day (BID) | ORAL | Status: DC

## 2019-03-13 MED ORDER — GUAIFENESIN-DM 100-10 MG/5ML PO SYRP: 5.0000 mL | ORAL_SOLUTION | Freq: Four times a day (QID) | ORAL | Status: DC | PRN

## 2019-03-13 MED ORDER — SIMVASTATIN 20 MG PO TABS
10.0000 mg | ORAL_TABLET | Freq: Every day | ORAL | Status: DC
  Administered 2019-03-13 – 2019-04-02 (×21): 10 mg via ORAL
  Filled 2019-03-13 (×21): qty 1

## 2019-03-13 MED ORDER — FUROSEMIDE 40 MG PO TABS
40.0000 mg | ORAL_TABLET | Freq: Once | ORAL | Status: AC
  Administered 2019-03-13: 40 mg via ORAL
  Filled 2019-03-13: qty 1

## 2019-03-13 MED ORDER — POLYETHYLENE GLYCOL 3350 17 G PO PACK: 17.0000 g | PACK | Freq: Every day | ORAL | Status: DC | PRN

## 2019-03-13 MED ORDER — ASPIRIN EC 81 MG PO TBEC
81.0000 mg | DELAYED_RELEASE_TABLET | Freq: Every day | ORAL | Status: DC
  Administered 2019-03-14 – 2019-04-03 (×21): 81 mg via ORAL
  Filled 2019-03-13 (×21): qty 1

## 2019-03-13 MED ORDER — ZOLPIDEM TARTRATE 5 MG PO TABS
5.0000 mg | ORAL_TABLET | Freq: Every evening | ORAL | Status: DC | PRN
  Administered 2019-03-13 – 2019-04-02 (×16): 5 mg via ORAL
  Filled 2019-03-13 (×16): qty 1

## 2019-03-13 MED ORDER — PROCHLORPERAZINE EDISYLATE 10 MG/2ML IJ SOLN: 5.0000 mg | Freq: Four times a day (QID) | INTRAMUSCULAR | Status: DC | PRN

## 2019-03-13 MED ORDER — HYDRALAZINE HCL 50 MG PO TABS
50.0000 mg | ORAL_TABLET | Freq: Three times a day (TID) | ORAL | Status: DC
  Administered 2019-03-13 – 2019-03-17 (×11): 50 mg via ORAL
  Filled 2019-03-13 (×11): qty 1

## 2019-03-13 MED ORDER — AMOXICILLIN-POT CLAVULANATE 875-125 MG PO TABS
1.0000 | ORAL_TABLET | Freq: Two times a day (BID) | ORAL | Status: AC
  Administered 2019-03-13 – 2019-03-18 (×10): 1 via ORAL
  Filled 2019-03-13 (×10): qty 1

## 2019-03-13 MED ORDER — FLUOXETINE HCL 20 MG PO CAPS
20.0000 mg | ORAL_CAPSULE | Freq: Every day | ORAL | Status: DC
  Administered 2019-03-14 – 2019-04-03 (×21): 20 mg via ORAL
  Filled 2019-03-13 (×21): qty 1

## 2019-03-13 MED ORDER — POTASSIUM CHLORIDE CRYS ER 20 MEQ PO TBCR
40.0000 meq | EXTENDED_RELEASE_TABLET | Freq: Two times a day (BID) | ORAL | Status: AC
  Administered 2019-03-13: 40 meq via ORAL
  Filled 2019-03-13: qty 2

## 2019-03-13 MED ORDER — PANTOPRAZOLE SODIUM 40 MG PO TBEC
40.0000 mg | DELAYED_RELEASE_TABLET | Freq: Every day | ORAL | Status: DC
  Administered 2019-03-14 – 2019-04-03 (×21): 40 mg via ORAL
  Filled 2019-03-13 (×21): qty 1

## 2019-03-13 MED ORDER — LORAZEPAM 2 MG/ML IJ SOLN: 2.0000 mg | INTRAMUSCULAR | Status: DC | PRN

## 2019-03-13 MED ORDER — FOLIC ACID 1 MG PO TABS: 1.0000 mg | ORAL_TABLET | Freq: Every day | ORAL | Status: DC

## 2019-03-13 MED ORDER — FOLIC ACID 1 MG PO TABS
1.0000 mg | ORAL_TABLET | Freq: Every day | ORAL | Status: DC
  Administered 2019-03-14 – 2019-04-03 (×21): 1 mg via ORAL
  Filled 2019-03-13 (×21): qty 1

## 2019-03-13 MED ORDER — DILTIAZEM HCL 60 MG PO TABS
90.0000 mg | ORAL_TABLET | Freq: Three times a day (TID) | ORAL | Status: DC
  Administered 2019-03-13 – 2019-03-27 (×39): 90 mg via ORAL
  Filled 2019-03-13 (×42): qty 1

## 2019-03-13 MED ORDER — TRAZODONE HCL 50 MG PO TABS
25.0000 mg | ORAL_TABLET | Freq: Every evening | ORAL | Status: DC | PRN
  Administered 2019-03-23 – 2019-03-28 (×6): 50 mg via ORAL
  Filled 2019-03-13 (×6): qty 1

## 2019-03-13 MED ORDER — THIAMINE HCL 100 MG PO TABS: 100.0000 mg | ORAL_TABLET | Freq: Every day | ORAL | Status: DC

## 2019-03-13 MED ORDER — FLEET ENEMA 7-19 GM/118ML RE ENEM: 1.0000 | ENEMA | Freq: Once | RECTAL | Status: DC | PRN

## 2019-03-13 MED ORDER — DIPHENHYDRAMINE HCL 12.5 MG/5ML PO ELIX
12.5000 mg | ORAL_SOLUTION | Freq: Four times a day (QID) | ORAL | Status: DC | PRN
  Administered 2019-03-25: 25 mg via ORAL
  Filled 2019-03-13: qty 10

## 2019-03-13 NOTE — Progress Notes (Signed)
Meredith Staggers, MD  Physician  Physical Medicine and Rehabilitation  PMR Pre-admission  Signed  Date of Service:  03/13/2019 12:49 PM       Related encounter: ED to Hosp-Admission (Current) from 03/09/2019 in [facility]      Signed         Show:Clear all [x]Manual[x]Template[]Copied  Added by: [x]Priya Matsen, Vertis Kelch, RN[x]Swartz, Celesta Gentile, MD  []Hover for details PMR Admission Coordinator Pre-Admission Assessment  Patient: [name] [name] is an 83 y.o., female MRN: [id number] DOB: [birth year] Height: 5' (152.4 cm) Weight: 81.6 kg  Insurance Information HMO:     PPO:      PCP:      IPA:      80/20:      OTHER: no HMO PRIMARY: Medicare a and b      Policy#: [id number]      Subscriber: pt Benefits:  Phone #: passport one online     Name: 03/13/2019 Eff. Date: 10/14/2003     Deduct: $1408      Out of Pocket Max: none      Life Max: none CIR: 100%      SNF: 20 full days Outpatient: 80%     Co-Pay: 20% Home Health: 100%      Co-Pay: none DME: 80%     Co-Pay: 20% Providers: pt choice  SECONDARY: [employer]      Policy#: [id number]      Subscriber: pt active indemnity plan 8/33/8250  Medicaid Application Date:       Case Manager:  Disability Application Date:       Case Worker:   The "Data Collection Information Summary" for patients in Inpatient Rehabilitation Facilities with attached "[place] Records" was provided and verbally reviewed with: Patient  Emergency Contact Information         Contact Information    Name Relation Home Work Mobile   Chisolm,Catherine Daughter [phone]  [phone]      Current Medical History  Patient Admitting Diagnosis: Fall, Debility  History of Present Illness: 83 year old presented 03/09/2019 with a fall. History of CVAs, coronary artery disease, paroxysmal atrial fibrillation not on anticoagulation secondary to falls, HTN, heart failure with preserved EF, on multiple  antianxiety meds,and  hypothyroidism .Patient fell off stool in her home and daughter was unable to get her up until 7 hours later when EMS was called to assist. Daughter reports decline in function with increased weakness and confusion, and tremors.   Patient was found to be hypothermic and confused. With no clear source of infection. Treated for sepsis one blood culture positive for gram positive rods which felt to be contamination.  Felt accidental polypharmacy with narcotic and benzo overdose along with chronic alcohol use, TSH and random cortisol stable, and hypothermia resolved. Clinically no signs of sepsis. Possible mild aspiration pneumonia. To finish her oral Augmentin for 3 more days and 1 of 2 blood cultures positive but likely skin contaminant.  Due to dehydration resolved with IV fluids, but now with mild edema and received lasix on 3/31. Will need follow up repeat BMP on CIR.  Due to accidental polypharmacy along with possible alcohol withdrawal, DT's resolved, and to continue home dose of Librium. Patient had been extensively counseled to quit alcohol.  Paroxysmal atrial fibrillation and sinus tachycardia treated with beta blocker dose increased for better control. Patient also added with Cardizem with good rate control. On baby ASA.   Patient's medical record from  [facility] has been reviewed by the rehabilitation admission coordinator and physician.  Past Medical History      Past Medical History:  Diagnosis Date  . Alopecia   . Anemia    hx of years ago   . Anxiety   . ARF (acute renal failure) ([place]) 08/23/2014  . Arthritis    psoriatic arthritis  . Back pain   . Blood transfusion    at age of 4  . Carotid stenosis 12/2016  . Chronic diastolic CHF (congestive heart failure) ([place])   . Depression   . Dysrhythmia 11/13/2016   PAFib for a short time- Echo done 11/14/16 PAF- to follow up with PCP- to see if she is a candiate for anticoags.  Not  started at times time due to alcohol abuse.  [name] GERD (gastroesophageal reflux disease)   . H/O hiatal hernia   . History of acute cholangitis   . Hyperlipidemia   . Hypertension   . Hypothyroidism   . IBS (irritable bowel syndrome)   . Pancreatitis   . Peripheral vascular disease ([place])   . PONV (postoperative nausea and vomiting)    with Breast reduction- only time  . Rosacea   . Seizures ([place]) 11/13/2016   Thopught to be from withdrawl Benzodiazepine  . Stroke [facility])    patient denies-  . Thyroid disease     Family History   family history includes Cancer in her father; Heart disease in her mother.  Prior Rehab/Hospitalizations Has the patient had prior rehab or hospitalizations prior to admission? Yes  Has the patient had major surgery during 100 days prior to admission? No              Current Medications  Current Facility-Administered Medications:  .  acetaminophen (TYLENOL) tablet 650 mg, 650 mg, Oral, Q6H PRN, Emeterio Reeve, DO, 650 mg at 03/13/19 0201 .  Ampicillin-Sulbactam (UNASYN) 3 g in sodium chloride 0.9 % 100 mL IVPB, 3 g, Intravenous, Q8H, Erenest Blank, RPH, Last Rate: 200 mL/hr at 03/13/19 0823, 3 g at 03/13/19 0823 .  aspirin EC tablet 81 mg, 81 mg, Oral, Daily, Emeterio Reeve, DO, 81 mg at 03/13/19 0630 .  chlordiazePOXIDE (LIBRIUM) capsule 10 mg, 10 mg, Oral, TID, Thurnell Lose, MD, 10 mg at 03/13/19 [phone] .  diltiazem (CARDIZEM) injection 10 mg, 10 mg, Intravenous, Q6H PRN, Thurnell Lose, MD .  diltiazem (CARDIZEM) tablet 90 mg, 90 mg, Oral, Q8H, Thurnell Lose, MD, 90 mg at 03/13/19 0932 .  FLUoxetine (PROZAC) capsule 20 mg, 20 mg, Oral, Daily, Barb Merino, MD, 20 mg at 03/13/19 0835 .  folic acid (FOLVITE) tablet 1 mg, 1 mg, Oral, Daily, Thurnell Lose, MD, 1 mg at 03/13/19 0836 .  hydrALAZINE (APRESOLINE) tablet 50 mg, 50 mg, Oral, Q8H, Thurnell Lose, MD, 50 mg at 03/13/19 3557 .  hyoscyamine (LEVBID)  0.375 MG 12 hr tablet 0.375 mg, 0.375 mg, Oral, BID PRN, Emeterio Reeve, DO .  levothyroxine (SYNTHROID, LEVOTHROID) tablet 75 mcg, 75 mcg, Oral, QAC breakfast, Emeterio Reeve, DO, 75 mcg at 03/13/19 [phone] .  LORazepam (ATIVAN) injection 2-3 mg, 2-3 mg, Intravenous, Q1H PRN, Emeterio Reeve, DO, 2 mg at 03/11/19 1842 .  metoprolol succinate (TOPROL-XL) 24 hr tablet 75 mg, 75 mg, Oral, Daily, Thurnell Lose, MD, 75 mg at 03/13/19 0837 .  metoprolol tartrate (LOPRESSOR) injection 5 mg, 5 mg, Intravenous, Q6H PRN, Thurnell Lose, MD, 5 mg at 03/11/19 1803 .  ondansetron (  ZOFRAN) tablet 8 mg, 8 mg, Oral, Q8H PRN, Alexander, Natalie, DO .  pantoprazole (PROTONIX) EC tablet 40 mg, 40 mg, Oral, Daily, Alexander, Natalie, DO, 40 mg at 03/13/19 0837 .  QUEtiapine (SEROQUEL) tablet 25 mg, 25 mg, Oral, QHS, Alexander, Natalie, DO, 25 mg at 03/12/19 2304 .  simvastatin (ZOCOR) tablet 10 mg, 10 mg, Oral, QHS, Alexander, Natalie, DO, 10 mg at 03/12/19 2310 .  sodium chloride flush (NS) 0.9 % injection 3 mL, 3 mL, Intravenous, Q12H, Alexander, Natalie, DO, 3 mL at 03/11/19 2143 .  thiamine (VITAMIN B-1) tablet 100 mg, 100 mg, Oral, Daily, Singh, Prashant K, MD, 100 mg at 03/13/19 0838 .  venlafaxine XR (EFFEXOR-XR) 24 hr capsule 37.5 mg, 37.5 mg, Oral, Q breakfast, Alexander, Natalie, DO, 37.5 mg at 03/13/19 0833  Patients Current Diet:     Diet Order                  Diet - low sodium heart healthy         Diet Heart Room service appropriate? No; Fluid consistency: Thin  Diet effective now               Precautions / Restrictions Precautions Precautions: Fall Restrictions Weight Bearing Restrictions: No   Has the patient had 2 or more falls or a fall with injury in the past year? Yes  Prior Activity Level Limited Community (1-2x/wk): Patient decline in funciton since death of spouse December 2019. Used RW, very sedentary. Assisted with bathing and dressing.  Incontinent using depends. Decline in function over 4 months, but especially 2 weeks pta.  Prior Functional Level Self Care: Did the patient need help bathing, dressing, using the toilet or eating? Needed some help  Indoor Mobility: Did the patient need assistance with walking from room to room (with or without device)? Needed some help  Stairs: Did the patient need assistance with internal or external stairs (with or without device)? Needed some help  Functional Cognition: Did the patient need help planning regular tasks such as shopping or remembering to take medications? Needed some help  Home Assistive Devices / Equipment Home Assistive Devices/Equipment: None Home Equipment: Walker - 2 wheels, Cane - single point  Prior Device Use: Indicate devices/aids used by the patient prior to current illness, exacerbation or injury? Walker  Current Functional Level Cognition  Overall Cognitive Status: Impaired/Different from baseline Current Attention Level: Selective Orientation Level: Oriented X4 Following Commands: Follows one step commands with increased time Safety/Judgement: Decreased awareness of safety, Decreased awareness of deficits General Comments: Pt not requiring as many cues this session to perform tasks. Was very interactive throughout.     Extremity Assessment (includes Sensation/Coordination)  Upper Extremity Assessment: Generalized weakness  Lower Extremity Assessment: Generalized weakness RLE Deficits / Details: unable to take steps without shifting wt laterally to have maxA from PT to move RLE. RLE Coordination: decreased fine motor, decreased gross motor    ADLs  Overall ADL's : Needs assistance/impaired Eating/Feeding: Minimal assistance, Sitting(hands with increased swelling and arthritis) Grooming: Wash/dry hands, Wash/dry face, Oral care, Moderate assistance, Sitting(hands with increased swelling and arthritis) Upper Body Bathing: Maximal  assistance, Sitting Lower Body Bathing: Maximal assistance, Total assistance, Sitting/lateral leans Upper Body Dressing : Moderate assistance, Sitting Lower Body Dressing: Maximal assistance, Total assistance, Sitting/lateral leans Toilet Transfer: +2 for safety/equipment, Moderate assistance, Minimal assistance Toilet Transfer Details (indicate cue type and reason): Simulated with stand pivot transfer from recliner to bed.  Toileting- Clothing Manipulation and Hygiene: +2   for safety/equipment, Total assistance, Sit to/from stand Toileting - Clothing Manipulation Details (indicate cue type and reason): One person assist with balance while second person assist with back peri care due to loose stool. Functional mobility during ADLs: +2 for safety/equipment, Minimal assistance, Rolling walker, Cueing for sequencing, Cueing for safety General ADL Comments: Pt up in recliner upon OT arrival.  Pt requesting to get up to relieve pressure on her bottom.  In standing, she requires total assist to complete back peri care (+2 assist for balance and for peri care).     Mobility  Overal bed mobility: Needs Assistance Bed Mobility: Sit to Supine Sidelying to sit: Mod assist, +2 for physical assistance Supine to sit: Max assist, +2 for physical assistance Sit to supine: Min assist General bed mobility comments: Assist to bring LEs back into bed and to control descent of trunk into bed.     Transfers  Overall transfer level: Needs assistance Equipment used: Rolling walker (2 wheeled) Transfer via Lift Equipment: Stedy Transfers: Sit to/from Stand, Stand Pivot Transfers Sit to Stand: +2 physical assistance, Min assist, Mod assist Stand pivot transfers: +2 physical assistance, Min assist General transfer comment: +2 min assist to perform sit<>stand x 2 and mod assist with final/third sit<>stand.  Performs pivot from recliner to bed with +2 min assist for safety.     Ambulation / Gait / Stairs /  Wheelchair Mobility  Ambulation/Gait General Gait Details: unable    Posture / Balance Balance Overall balance assessment: Needs assistance Sitting-balance support: No upper extremity supported, Feet supported Sitting balance-Leahy Scale: Fair Standing balance support: Bilateral upper extremity supported, During functional activity Standing balance-Leahy Scale: Poor Standing balance comment: using RW as Bilateral UE support    Special needs/care consideration BiPAP/CPAP  N/a CPM  N/a Continuous Drip IV  N/a Dialysis n/a Life Vest  N/a Oxygen  N/a Special Bed  N/a Trach Size  N/a Wound Vac n/a Skin  MASD groin, R LE cellulitis, cracking heels bilaterally; ecchymosis to surrounding eyes and face; ecchymosis to BUE , especially to RUE Bowel mgmt:  Incontinent LBM 3/30 Bladder mgmt:  External catheter Diabetic mgmt: m/a Behavioral consideration  Tearful speaking of the death of spouse 112/23/2019 Chemo/radiation  N/a   Previous Home Environment  Living Arrangements: Children(daughter lives with patietn since December 2019 right before)  Lives With: Daughter(daughter has moved in with patient) Available Help at Discharge: Family, Available 24 hours/day(daughter unemployed; has seperate apartment but living with ) Type of Home: House Home Layout: Full bath on main level Home Access: Stairs to enter Entrance Stairs-Number of Steps: 5 Bathroom Shower/Tub: Tub/shower unit Bathroom Toilet: Standard Bathroom Accessibility: Yes How Accessible: Accessible via walker Home Care Services: No Additional Comments: patietn sleeps on couch on main level; was doing this over past few months  Discharge Living Setting Plans for Discharge Living Setting: Patient's home, Lives with (comment)(daughter living with patient over past 4 months with pt's sp) Type of Home at Discharge: House Discharge Home Layout: Two level, Full bath on main level Discharge Home Access: Stairs to enter Entrance  Stairs-Number of Steps: 5 Discharge Bathroom Shower/Tub: Tub/shower unit Discharge Bathroom Toilet: Standard Discharge Bathroom Accessibility: Yes How Accessible: Accessible via walker Does the patient have any problems obtaining your medications?: No  Social/Family/Support Systems Patient Roles: Parent Contact Information: Catherine, daughter Anticipated Caregiver: daughter Anticipated Caregiver's Contact Information: [phone] Ability/Limitations of Caregiver: daughter unemployed; was lving in own apartment but with pt over past few months Caregiver Availability: 24/7 Discharge Plan   Discussed with Primary Caregiver: Yes Is Caregiver In Agreement with Plan?: Yes Does Caregiver/Family have Issues with Lodging/Transportation while Pt is in Rehab?: No   Patient very tearful and sates depressed since the death of there spouse 12/04/2018. Daughter has separate apartment, but has moved in with patient since death of her Dad.  Goals/Additional Needs Patient/Family Goal for Rehab: supervision to min asisst with PT , min asisst with OT, supervision with SLP Expected length of stay: ELOS 14 to 20 days Special Service Needs: patient admites depression, sees psychiatrist for years; admits more depressed with spouse's death Christmas Eve 2019 Additional Information: Patient admits drinks daily ; wine Pt/Family Agrees to Admission and willing to participate: Yes Program Orientation Provided & Reviewed with Pt/Caregiver Including Roles  & Responsibilities: Yes  Decrease burden of Care through IP rehab admission: n/a  Possible need for SNF placement upon discharge: if patient does not reach supervision to min assist level, daughter may consider SNF  Patient Condition: I have reviewed medical records from [facility] , spoken with patient, and daughter. I met with patient at the bedside  for inpatient rehabilitation assessment.  Patient will benefit from ongoing PT, OT, and SLP),  can actively participate in 3 hours of therapy a day 5 days of the week, and can make measurable gains during the admission.  Patient will also benefit from the coordinated team approach during an Inpatient Acute Rehabilitation admission.  The patient will receive intensive therapy as well as Rehabilitation physician, nursing, social worker, and care management interventions.  Due to bowel management, bladder management, safety, skin/wound care, disease management, medical administration, pain management, patient education the patient requires 24 hour a day rehabilitation nursing.  The patient is currently mod assist with mobility and basic ADLs.  Discharge setting and therapy post discharge at home with home health is anticipated.  Patient has agreed to participate in the Acute Inpatient Rehabilitation Program and will admit today.  Preadmission Screen Completed By:  ,  Godwin, 03/13/2019 12:51 PM ______________________________________________________________________   Discussed status with Dr. Swartz  on  03/13/2019 at  1309 and received approval for admission today.  Admission Coordinator:  ,  Godwin, RN, MSN time  1309 Date  03/13/2019   Assessment/Plan: Diagnosis: Functional deficits due to debility from unintentional overdose and multiple medical  1. Does the need for close, 24 hr/day Medical supervision in concert with the patient's rehab needs make it unreasonable for this patient to be served in a less intensive setting? Yes 2. Co-Morbidities requiring supervision/potential complications: hx cva's, CAD, pAF, htn, chf 3. Due to bladder management, bowel management, safety, skin/wound care, disease management, medication administration, pain management and patient education, does the patient require 24 hr/day rehab nursing? Yes 4. Does the patient require coordinated care of a physician, rehab nurse, PT (1-2 hrs/day, 5 days/week), OT (1-2 hrs/day, 5 days/week) and SLP  (1-2 hrs/day, 5 days/week) to address physical and functional deficits in the context of the above medical diagnosis(es)? Yes Addressing deficits in the following areas: balance, endurance, locomotion, strength, transferring, bowel/bladder control, bathing, dressing, feeding, grooming, toileting, cognition, speech and psychosocial support 5. Can the patient actively participate in an intensive therapy program of at least 3 hrs of therapy 5 days a week? Yes 6. The potential for patient to make measurable gains while on inpatient rehab is excellent 7. Anticipated functional outcomes upon discharge from inpatients are: supervision and min assist PT, min assist OT, supervision SLP 8. Estimated rehab length of stay to reach   the above functional goals is: 14-20 days 9. Anticipated D/C setting: Home 10. Anticipated post D/C treatments: HH therapy 11. Overall Rehab/Functional Prognosis: excellent  MD Signature: Zachary T. Swartz, MD, FAAPMR [facility] 03/13/2019         Revision History                    

## 2019-03-13 NOTE — Progress Notes (Signed)
Patient alert and oriented x4. Cooperative with staff assistance. Oriented to staff and call bell use.

## 2019-03-13 NOTE — H&P (Signed)
Physical Medicine and Rehabilitation Admission H&P    Chief Complaint  Patient presents with  . Fall    HPI:  [name] [name] is an 83 year old female with history of HTN, PAF--no AC due to falls, psoriatic arthritis, anxiety/depression, chronic diastolic CHF, who was admitted on 03/09/19 after falling in the middle of the night and laid on the tile floor till family was able to get help next morning. Per reports, has had increased in falls with worsening of tremors and weakness --has been spending most of the day in bed for past 2 weeks. She was found to be hypothermic with temp 93 degrees, was confused with strong odor of urine. She was warmed by bear hugger, started on IVF for AKI with evidence of rhabdomyolysis as well as antibiotics empirically due to concerns of sepsis. Urine/CXR negative for infectious work up and IV antibiotics transitioned to Augmentin due to concerns of aspiration PNA. 1/4 BC positive for gram negative rods and felt to be contaminant.   On CIWA protocol due to concerns of alcohol abuse ---home dose librium increased to 10 mg tid.  She has had issues with A fib with RVR treated with Cardizem drip. Rectal pouch placed due to diarrhea.  Fluid overload treated with dose of lasix today.  Therapy ongoing and patient noted to be debilitated with difficulty standing or performing ADLs as well as problems processing with STM deficits. CIR recommended due to functional deficits.    Review of Systems  Constitutional: Negative for chills and fever.  HENT: Negative for hearing loss.   Eyes: Negative for blurred vision and double vision.  Respiratory: Negative for cough and shortness of breath.   Cardiovascular: Positive for leg swelling. Negative for chest pain and palpitations.  Gastrointestinal: Positive for diarrhea. Negative for heartburn.       Incontinent of bowel due to IBS  Genitourinary: Positive for dysuria.  Musculoskeletal: Positive for back pain (chronic) and  myalgias.  Skin: Negative for rash.  Neurological: Positive for weakness. Negative for dizziness and headaches.  Psychiatric/Behavioral: Positive for depression. The patient is nervous/anxious.       Past Medical History:  Diagnosis Date  . Alopecia   . Anemia    hx of years ago   . Anxiety   . ARF (acute renal failure) ([place]) 08/23/2014  . Arthritis    psoriatic arthritis  . Back pain   . Blood transfusion    at age of 4  . Carotid stenosis 12/2016  . Chronic diastolic CHF (congestive heart failure) ([place])   . Depression   . Dysrhythmia 11/13/2016   PAFib for a short time- Echo done 11/14/16 PAF- to follow up with PCP- to see if she is a candiate for anticoags.  Not started at times time due to alcohol abuse.  [name] GERD (gastroesophageal reflux disease)   . H/O hiatal hernia   . History of acute cholangitis   . History of GI diverticular bleed   . Hyperlipidemia   . Hypertension   . Hypothyroidism   . IBS (irritable bowel syndrome)   . Pancreatitis   . Peripheral vascular disease ([place])   . PONV (postoperative nausea and vomiting)    with Breast reduction- only time  . Rosacea   . Seizures ([place]) 11/13/2016   Thopught to be from withdrawl Benzodiazepine  . Stroke [facility])    patient denies-  . Thyroid disease     Past Surgical History:  Procedure Laterality Date  . ABDOMINAL  HYSTERECTOMY     partial  . BACK SURGERY  2010   thoractic-screws and rods  . BILE DUCT STENT PLACEMENT  08/26/2014  . BREAST REDUCTION SURGERY    . CHOLECYSTECTOMY  1988   lap  . COLONOSCOPY    . COLONOSCOPY Left 09/08/2014   Procedure: COLONOSCOPY;  Surgeon: Arta Silence, MD;  Location: [facility] ENDOSCOPY;  Service: Endoscopy;  Laterality: Left;  . ENDARTERECTOMY Left 12/28/2016  . ENDARTERECTOMY Left 12/28/2016   Procedure: ENDARTERECTOMY CAROTID LEFT WITH XENOSURE BIOLOGIC PATCH ANGIOPLASTY;  Surgeon: Angelia Mould, MD;  Location: [place];  Service: Vascular;  Laterality: Left;  . ERCP    .  ERCP N/A 08/26/2014   Procedure: ENDOSCOPIC RETROGRADE CHOLANGIOPANCREATOGRAPHY (ERCP);  Surgeon: Jeryl Columbia, MD;  Location: [facility] ENDOSCOPY;  Service: Endoscopy;  Laterality: N/A;  . ERCP N/A 09/11/2014   Procedure: ENDOSCOPIC RETROGRADE CHOLANGIOPANCREATOGRAPHY (ERCP);  Surgeon: Arta Silence, MD;  Location: [facility] ENDOSCOPY;  Service: Endoscopy;  Laterality: N/A;  . EUS N/A 09/11/2014   Procedure: ESOPHAGEAL ENDOSCOPIC ULTRASOUND (EUS) RADIAL;  Surgeon: Arta Silence, MD;  Location: [facility] ENDOSCOPY;  Service: Endoscopy;  Laterality: N/A;  . EYE SURGERY Bilateral    Cataract  . JOINT REPLACEMENT  2005   left shoulder replacement   . ORIF CLAVICULAR FRACTURE Left 07/18/2014   Procedure: OPEN REDUCTION INTERNAL FIXATION (ORIF) LEFT CLAVICULAR FRACTURE;  Surgeon: Ninetta Lights, MD;  Location: [place];  Service: Orthopedics;  Laterality: Left;  . SPYGLASS CHOLANGIOSCOPY N/A 09/11/2014   Procedure: SPYGLASS CHOLANGIOSCOPY;  Surgeon: Arta Silence, MD;  Location: [facility] ENDOSCOPY;  Service: Endoscopy;  Laterality: N/A;  . TONSILLECTOMY  as child  . TOTAL KNEE ARTHROPLASTY Left 03/10/2016   Procedure: LEFT TOTAL KNEE ARTHROPLASTY;  Surgeon: Ninetta Lights, MD;  Location: [place];  Service: Orthopedics;  Laterality: Left;  [name] VENTRAL HERNIA REPAIR  06/08/2012   Procedure: LAPAROSCOPIC VENTRAL HERNIA;  Surgeon: Adin Hector, MD;  Location: [facility] ORS;  Service: General;  Laterality: Right;    Family History  Problem Relation Age of Onset  . Heart disease Mother   . Cancer Father        lung  . Bipolar disorder Brother     Social History: Lives with daughter. Retired Network engineer. Was independent PTA--used walker occasionally. She  reports that she has never smoked. She has never used smokeless tobacco. She reports current alcohol use-- 2 bottles of wine daily since husband passed away.   She reports that she does not use drugs.   Allergies  Allergen Reactions  . Tetanus Toxoids Swelling and  Other (See Comments)    Arm (site) became swollen as a child  . Snake Antivenin [Antivenin Crotalidae Polyvalent] Other (See Comments)    Just can't take per daughter  . Yellow Dyes (Non-Tartrazine) Itching and Other (See Comments)    Makes patient nervous     Medications Prior to Admission  Medication Sig Dispense Refill  . acetaminophen (TYLENOL) 325 MG tablet Take 2 tablets (650 mg total) by mouth every 6 (six) hours as needed for mild pain (or Fever >/= 101).    [name] aspirin EC 81 MG tablet Take 81 mg by mouth daily.    [name] atenolol (TENORMIN) 50 MG tablet Take 1 tablet (50 mg total) by mouth daily. (Patient taking differently: Take 100 mg by mouth every morning. ) 30 tablet 0  . chlordiazePOXIDE (LIBRIUM) 5 MG capsule Take 10 mg by mouth 2 (two) times daily.     [name]  FLUoxetine (PROZAC) 20 MG capsule Take 20 mg by mouth daily.    . furosemide (LASIX) 20 MG tablet Take 1 tablet (20 mg total) by mouth daily as needed. For > 3 lb weight gain in 24 hours. (Patient taking differently: Take 20 mg by mouth daily. ) 30 tablet 0  . hyoscyamine (LEVBID) 0.375 MG 12 hr tablet Take 0.375 mg by mouth 2 (two) times daily.     [name] levothyroxine (SYNTHROID, LEVOTHROID) 75 MCG tablet Take 75 mcg by mouth daily before breakfast.     . potassium chloride SA (K-DUR,KLOR-CON) 20 MEQ tablet Take 1 tablet (20 mEq total) by mouth daily as needed. Take only when taking Lasix. (Patient taking differently: Take 40 mEq by mouth daily. )    . QUEtiapine (SEROQUEL) 25 MG tablet Take 1 tablet (25 mg total) by mouth at bedtime. 30 tablet 0  . simvastatin (ZOCOR) 10 MG tablet Take 10 mg by mouth every morning.     . venlafaxine XR (EFFEXOR-XR) 37.5 MG 24 hr capsule Take 37.5 mg by mouth daily with breakfast.    . zolpidem (AMBIEN) 10 MG tablet Take 10 mg by mouth at bedtime.      Drug Regimen Review  Drug regimen was reviewed and remains appropriate with no significant issues identified  Home: Home Living Family/patient  expects to be discharged to:: Private residence Living Arrangements: Children(daughter lives with patietn since December 2019 right before) Available Help at Discharge: Family, Available 24 hours/day(daughter unemployed; has [place] apartment but living with ) Type of Home: House Home Access: Stairs to enter Technical brewer of Steps: 5 Home Layout: Full bath on main level Bathroom Shower/Tub: Chiropodist: Standard Bathroom Accessibility: Yes Home Equipment: Environmental consultant - 2 wheels, Garza-[place] II - single point Additional Comments: patietn sleeps on couch on main level; was doing this over past few months  Lives With: Daughter(daughter has moved in with patient)   Functional History: Prior Function Level of Independence: Needs assistance Gait / Transfers Assistance Needed: ambulates with RW ADL's / Homemaking Assistance Needed: asissted by daughter over past 4 months  Functional Status:  Mobility: Bed Mobility Overal bed mobility: Needs Assistance Bed Mobility: Sit to Supine Sidelying to sit: Mod assist, +2 for physical assistance Supine to sit: Max assist, +2 for physical assistance Sit to supine: Min assist General bed mobility comments: Assist to bring LEs back into bed and to control descent of trunk into bed.  Transfers Overall transfer level: Needs assistance Equipment used: Rolling walker (2 wheeled) Transfer via Lift Equipment: Stedy Transfers: Sit to/from Stand, Risk manager Sit to Stand: +2 physical assistance, Min assist, Mod assist Stand pivot transfers: +2 physical assistance, Min assist General transfer comment: +2 min assist to perform sit<>stand x 2 and mod assist with final/third sit<>stand.  Performs pivot from recliner to bed with +2 min assist for safety.  Ambulation/Gait General Gait Details: unable    ADL: ADL Overall ADL's : Needs assistance/impaired Eating/Feeding: Minimal assistance, Sitting(hands with increased swelling and  arthritis) Grooming: Wash/dry hands, Wash/dry face, Oral care, Moderate assistance, Sitting(hands with increased swelling and arthritis) Upper Body Bathing: Maximal assistance, Sitting Lower Body Bathing: Maximal assistance, Total assistance, Sitting/lateral leans Upper Body Dressing : Moderate assistance, Sitting Lower Body Dressing: Maximal assistance, Total assistance, Sitting/lateral leans Toilet Transfer: +2 for safety/equipment, Moderate assistance, Minimal assistance Toilet Transfer Details (indicate cue type and reason): Simulated with stand pivot transfer from recliner to bed.  Toileting- Clothing Manipulation and Hygiene: +2 for safety/equipment, Total assistance,  Sit to/from stand Toileting - Clothing Manipulation Details (indicate cue type and reason): One person assist with balance while second person assist with back peri care due to loose stool. Functional mobility during ADLs: +2 for safety/equipment, Minimal assistance, Rolling walker, Cueing for sequencing, Cueing for safety General ADL Comments: Pt up in recliner upon OT arrival.  Pt requesting to get up to relieve pressure on her bottom.  In standing, she requires total assist to complete back peri care (+2 assist for balance and for peri care).   Cognition: Cognition Overall Cognitive Status: Impaired/Different from baseline Orientation Level: Oriented X4 Cognition Arousal/Alertness: Awake/alert Behavior During Therapy: WFL for tasks assessed/performed Overall Cognitive Status: Impaired/Different from baseline Area of Impairment: Attention, Following commands, Safety/judgement Current Attention Level: Selective Memory: Decreased short-term memory Following Commands: Follows one step commands with increased time Safety/Judgement: Decreased awareness of safety, Decreased awareness of deficits Awareness: Emergent Problem Solving: Slow processing, Requires verbal cues General Comments: Pt not requiring as many cues this  session to perform tasks. Was very interactive throughout.    Blood pressure (!) 151/64, pulse 77, temperature 97.6 F (36.4 C), temperature source Oral, resp. rate 20, height 5' (1.524 m), weight 81.6 kg, SpO2 94 %. Physical Exam  Nursing note and vitals reviewed. Constitutional: She is oriented to person, place, and time. She appears well-developed and well-nourished.  HENT:  Bruising about face/eyes  Eyes: Pupils are equal, round, and reactive to light. Conjunctivae are normal.  Periorbital ecchymosis   Neck: Normal range of motion. No thyromegaly present.  Cardiovascular: Normal rate and regular rhythm.  Respiratory: Effort normal. No respiratory distress. She has no wheezes.  GI: Soft. She exhibits no distension. There is no abdominal tenderness.  Musculoskeletal:        General: Edema (bilateral ankles/feet) present.     Comments: Diffuse ecchymosis with dependent edema RUE. Dry flaky skin BLE. Right hand and wrist tender with ulnar deviation of fingers.   Neurological: She is alert and oriented to person, place, and time.  Mild dysarthria. Able to answer basic biographic questions but tangential with lack of time frame tends to refers most problems back to the loss of her husband. Intact language, functional memory. RUE 3/5. LUE 4/5. Bilateral LE 2+/5 HF, 3/5 KE and 3+/5 ADF/PF. Senses pain in all 4's.   Skin: Skin is warm and dry.  Left TKA scar.   Psychiatric:  A little anxious    Results for orders placed or performed during the hospital encounter of 03/09/19 (from the past 48 hour(s))  CBC     Status: Abnormal   Collection Time: 03/12/19  5:16 AM  Result Value Ref Range   WBC 11.8 (H) 4.0 - 10.5 K/uL   RBC 3.70 (L) 3.87 - 5.11 MIL/uL   Hemoglobin 11.3 (L) 12.0 - 15.0 g/dL   HCT 35.7 (L) 36.0 - 46.0 %   MCV 96.5 80.0 - 100.0 fL   MCH 30.5 26.0 - 34.0 pg   MCHC 31.7 30.0 - 36.0 g/dL   RDW 13.4 11.5 - 15.5 %   Platelets 101 (L) 150 - 400 K/uL    Comment: REPEATED TO  VERIFY Immature Platelet Fraction may be clinically indicated, consider ordering this additional test [id number] CONSISTENT WITH PREVIOUS RESULT    nRBC 0.0 0.0 - 0.2 %    Comment: Performed at [facility], [place] [address] [place], [place] [address]  Basic metabolic panel     Status: Abnormal   Collection Time: 03/12/19  5:16  AM  Result Value Ref Range   Sodium 141 135 - 145 mmol/L   Potassium 3.5 3.5 - 5.1 mmol/L   Chloride 113 (H) 98 - 111 mmol/L   CO2 18 (L) 22 - 32 mmol/L   Glucose, Bld 113 (H) 70 - 99 mg/dL   BUN 22 8 - 23 mg/dL   Creatinine, Ser 1.18 (H) 0.44 - 1.00 mg/dL   Calcium 8.7 (L) 8.9 - 10.3 mg/dL   GFR calc non Af Amer 44 (L) >60 mL/min   GFR calc Af Amer 50 (L) >60 mL/min   Anion gap 10 5 - 15    Comment: Performed at [facility] [address] [place], [place] [address]  Urinalysis, Routine w reflex microscopic     Status: Abnormal   Collection Time: 03/12/19  6:08 AM  Result Value Ref Range   Color, Urine YELLOW YELLOW   APPearance HAZY (A) CLEAR   Specific Gravity, Urine 1.015 1.005 - 1.030   pH 5.0 5.0 - 8.0   Glucose, UA NEGATIVE NEGATIVE mg/dL   Hgb urine dipstick SMALL (A) NEGATIVE   Bilirubin Urine NEGATIVE NEGATIVE   Ketones, ur NEGATIVE NEGATIVE mg/dL   Protein, ur 30 (A) NEGATIVE mg/dL   Nitrite NEGATIVE NEGATIVE   Leukocytes,Ua MODERATE (A) NEGATIVE   RBC / HPF 11-20 0 - 5 RBC/hpf   WBC, UA >50 (H) 0 - 5 WBC/hpf   Bacteria, UA RARE (A) NONE SEEN   Squamous Epithelial / LPF 0-5 0 - 5    Comment: Performed at [facility], [place] [address], [place], [place] [address]  CBC     Status: Abnormal   Collection Time: 03/13/19  5:42 AM  Result Value Ref Range   WBC 8.1 4.0 - 10.5 K/uL   RBC 3.24 (L) 3.87 - 5.11 MIL/uL   Hemoglobin 10.1 (L) 12.0 - 15.0 g/dL   HCT 32.0 (L) 36.0 - 46.0 %   MCV 98.8 80.0 - 100.0 fL   MCH 31.2 26.0 - 34.0 pg   MCHC 31.6 30.0 - 36.0 g/dL   RDW 13.4 11.5 - 15.5 %   Platelets 105 (L) 150 - 400 K/uL     Comment: REPEATED TO VERIFY Immature Platelet Fraction may be clinically indicated, consider ordering this additional test [id number] CONSISTENT WITH PREVIOUS RESULT    nRBC 0.2 0.0 - 0.2 %    Comment: Performed at [facility], [place] [address] [place], [place] [address]  Basic metabolic panel     Status: Abnormal   Collection Time: 03/13/19  5:42 AM  Result Value Ref Range   Sodium 142 135 - 145 mmol/L   Potassium 3.4 (L) 3.5 - 5.1 mmol/L   Chloride 114 (H) 98 - 111 mmol/L   CO2 18 (L) 22 - 32 mmol/L   Glucose, Bld 108 (H) 70 - 99 mg/dL   BUN 20 8 - 23 mg/dL   Creatinine, Ser 1.33 (H) 0.44 - 1.00 mg/dL   Calcium 8.4 (L) 8.9 - 10.3 mg/dL   GFR calc non Af Amer 38 (L) >60 mL/min   GFR calc Af Amer 44 (L) >60 mL/min   Anion gap 10 5 - 15    Comment: Performed at [facility] [address], [place], [place] [address]   No results found.     Medical Problem List and Plan: 1.  Functional mobility deficits secondary to rhabdomyolysis, pneumonia, ETOH abuse.  -admit to inpatient rehab 2.  Antithrombotics: -  DVT/anticoagulation:  Pharmaceutical: Lovenox  -antiplatelet therapy: ASA 3. Pain Management: tylenol prn.  4. Mood: team to provide ego support. LCSW/Pschology to follow up for support.   -antipsychotic agents: On Seroquel and librium (used 10 mg bid for anxiety).   5. Neuropsych: This patient is not capable of making decisions on her own behalf. 6. Skin/Wound Care: Routine pressure relief measures.  7. Fluids/Electrolytes/Nutrition: Monitor I/O. Check lytes in am. Offer supplements during the day due to low calorie malnutrition.  8. Aspiration PNA?: Day 3 antibiotics--transition to Augment for a week of antibiotic course. 9. AKI with mild rhabdomyolysis: Recheck labs in am. 10. Chronic diastolic CHF: Order daily weights and monitor for signs of overload.   11. H/o anxiety/depression: Lost husband 12/19. Managed by Dr. Francis Dowse Effexor, Prozac and  librium.  12. Abnormal LFTs:  Resolving. Recheck in am.  13. Fall risk: Continue high low bed.  14. Diarrhea: Continue rectal tube as is incontinent of bowel and has irritated       Bary Leriche, PA-C 03/13/2019

## 2019-03-13 NOTE — Discharge Summary (Signed)
[name] [name] WHQ:[id number] DOB: 01/31/1936 DOA: 03/09/2019  PCP: [name], MD  Admit date: 03/09/2019  Discharge date: 03/13/2019  Admitted From: Home  Disposition:  CIR   Recommendations for Outpatient Follow-up:   Follow up with PCP in 1-2 weeks  PCP Please obtain BMP/CBC, 2 view CXR in 1week,  (see Discharge instructions)   PCP Please follow up on the following pending results:    Home Health: None   Equipment/Devices: None  Consultations: None Discharge Condition: Stable   CODE STATUS: Full   Diet Recommendation: Heart Healthy     Chief Complaint  Patient presents with   Fall     Brief history of present illness from the day of admission and additional interim summary    Patient is a 83 year old female with history of previous strokes, coronary artery disease, paroxysmal atrial fibrillation not on anticoagulation secondary to fall, hypertension, heart failure with preserved ejection fraction, on multiple antianxiety medications, hypothyroidism who was brought to the hospital where she was found falling on the porch and laying on floor for a while.  EMS found her hypothermic.  Patient was confused.  No clear source of infection.  She has been admitted and treated as sepsis.  1 of the blood cultures positive for gram-positive rods which likely is contamination.                                                                 Hospital Course   Hypothermia: Was likely due to accidental polypharmacy with narcotic and benzo overdose along with alcohol use, TSH and random cortisol stable, hypothermia with supportive care has resolved.  Clinically no signs of sepsis.  Could have had mild aspiration pneumonia but clinically stable.    She will finish her oral Augmentin course and 3 more days, 1 out of 2 blood  cultures was positive for clostridia perfringens which likely with a skin contaminant.  She is now close to her baseline.  Counseled to quit alcohol, taper down narcotics and benzodiazepines as much as possible.  Seen by PT will require SNF/CIR where she will be discharged.  Clinically much improved.  Acute kidney injury: Due to dehydration resolved with IV fluids.  Now mild edema for which she will get Lasix on 03/13/2019 kindly repeat BMP in a day or 2 at CIR.  Altered mental status: No focal neurological deficit.  CT scan of the head neck and a skeletal survey was negative.  Due to accidental polypharmacy along with possible alcohol withdrawal, DTs have resolved, continue home dose Librium and continue monitoring.  Has been extensively counseled to quit alcohol.  Abnormal liver function test: Probably due to #1.  Repeat test with improvement.  Pete CMP in 7 to 10 days.  Chronic diastolic heart failure 65% on recent echocardiogram: Lasix x  1 - 03/13/19  Hypertension history of paroxysmal atrial fibrillation and sinus tachycardia: Beta blocker dose increased for better control.  Has been now placed on combination of beta-blocker and Cardizem with good rate control.  Mali vas 2 score will be 3 but she is not on any anticoagulation due to fall risk.  On baby aspirin.  ? Asp PNA - clinically stable, cleared by speech finish Augmentin course and 3 more days.  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)   AKI (acute kidney injury) ([place])   Elevated liver enzymes   Thrombocytopenia ([place])    Discharge instructions    Discharge Instructions    Diet - low sodium heart  healthy   Complete by:  As directed    Discharge instructions   Complete by:  As directed    Follow with Primary MD [name], MD in 7 days   Get CBC, CMP, 2 view Chest X ray -  checked  by Primary MD or CIR MD in 5-7 days    Activity: As tolerated with Full fall precautions use walker/cane & assistance as needed  Disposition CIR   Diet: Heart Healthy   Special Instructions: If you have smoked or chewed Tobacco  in the last 2 yrs please stop smoking, stop any regular Alcohol  and or any Recreational drug use.  On your next visit with your primary care physician please Get Medicines reviewed and adjusted.  Please request your Prim.MD to go over all Hospital Tests and Procedure/Radiological results at the follow up, please get all Hospital records sent to your Prim MD by signing hospital release before you go home.  If you experience worsening of your admission symptoms, develop shortness of breath, life threatening emergency, suicidal or homicidal thoughts you must seek medical attention immediately by calling 911 or calling your MD immediately  if symptoms less severe.  You Must read complete instructions/literature along with all the possible adverse reactions/side effects for all the Medicines you take and that have been prescribed to you. Take any new Medicines after you have completely understood and accpet all the possible adverse reactions/side effects.   Increase activity slowly   Complete by:  As directed       Discharge Medications   Allergies as of 03/13/2019      Reactions   Tetanus Toxoids Swelling, Other (See Comments)   Arm (site) became swollen as a child   Snake Antivenin [antivenin Crotalidae Polyvalent] Other (See Comments)   Just can't take per daughter   Yellow Dyes (non-tartrazine) Itching, Other (See Comments)   Makes patient nervous       Medication List    TAKE these medications   acetaminophen 325 MG tablet Commonly known as:  TYLENOL Take 2  tablets (650 mg total) by mouth every 6 (six) hours as needed for mild pain (or Fever >/= 101).   amoxicillin-clavulanate 875-125 MG tablet Commonly known as:  Augmentin Take 1 tablet by mouth 2 (two) times daily for 7 days.   aspirin EC 81 MG tablet Take 81 mg by mouth daily.   atenolol 50 MG tablet Commonly known as:  TENORMIN Take 1 tablet (50 mg  total) by mouth daily. What changed:    how much to take  when to take this   chlordiazePOXIDE 5 MG capsule Commonly known as:  LIBRIUM Take 10 mg by mouth 2 (two) times daily.   diltiazem 90 MG tablet Commonly known as:  CARDIZEM Take 1 tablet (90 mg total) by mouth 2 (two) times daily.   FLUoxetine 20 MG capsule Commonly known as:  PROZAC Take 20 mg by mouth daily.   folic acid 1 MG tablet Commonly known as:  FOLVITE Take 1 tablet (1 mg total) by mouth daily. Start taking on:  March 14, 2019   furosemide 20 MG tablet Commonly known as:  LASIX Take 1 tablet (20 mg total) by mouth daily as needed. For > 3 lb weight gain in 24 hours. What changed:    when to take this  additional instructions   hyoscyamine 0.375 MG 12 hr tablet Commonly known as:  LEVBID Take 0.375 mg by mouth 2 (two) times daily.   levothyroxine 75 MCG tablet Commonly known as:  SYNTHROID, LEVOTHROID Take 75 mcg by mouth daily before breakfast.   potassium chloride SA 20 MEQ tablet Commonly known as:  K-DUR,KLOR-CON Take 1 tablet (20 mEq total) by mouth daily as needed. Take only when taking Lasix. What changed:    how much to take  when to take this  additional instructions   QUEtiapine 25 MG tablet Commonly known as:  SEROQUEL Take 1 tablet (25 mg total) by mouth at bedtime.   simvastatin 10 MG tablet Commonly known as:  ZOCOR Take 10 mg by mouth every morning.   thiamine 100 MG tablet Take 1 tablet (100 mg total) by mouth daily. Start taking on:  March 14, 2019   venlafaxine XR 37.5 MG 24 hr capsule Commonly known as:   EFFEXOR-XR Take 37.5 mg by mouth daily with breakfast.   zolpidem 10 MG tablet Commonly known as:  AMBIEN Take 10 mg by mouth at bedtime.       Follow-up Information    [name], MD. Schedule an appointment as soon as possible for a visit in 2 week(s).   Specialty:  Internal Medicine Contact information: [address] [place] [address] [place] [address] [phone]           Major procedures and Radiology Reports - PLEASE review detailed and final reports thoroughly  -        Dg Chest 2 View  Result Date: 03/10/2019 CLINICAL DATA:  Abnormal lung sounds EXAM: CHEST - 2 VIEW COMPARISON:  03/09/2019 FINDINGS: Lungs are essentially clear. Mild eventration of the right hemidiaphragm. No pleural effusion or pneumothorax. The heart is normal in size. Lower thoracic/upper lumbar fixation hardware, incompletely visualized. Status post ORIF of the left lateral clavicle. Left shoulder arthroplasty. IMPRESSION: No evidence of acute cardiopulmonary disease. Electronically Signed   By: Julian Hy M.D.   On: 03/10/2019 06:49   Dg Chest 2 View  Result Date: 03/09/2019 CLINICAL DATA:  Weakness and fall. EXAM: CHEST - 2 VIEW COMPARISON:  06/26/2017 FINDINGS: The heart size and mediastinal contours are within normal limits. There is no evidence of pulmonary edema, consolidation, pneumothorax, nodule or pleural fluid. Stable appearance of left clavicular reconstruction plate, left shoulder arthroplasty and spinal fusion rods. IMPRESSION: No active cardiopulmonary disease. Electronically Signed   By: Aletta Edouard M.D.   On: 03/09/2019 11:26   Ct Head Wo Contrast  Result Date: 03/09/2019 CLINICAL DATA:  Unwitnessed fall. EXAM: CT HEAD WITHOUT CONTRAST  CT CERVICAL SPINE WITHOUT CONTRAST TECHNIQUE: Multidetector CT imaging of the head and cervical spine was performed following the standard protocol without intravenous contrast. Multiplanar CT image reconstructions of the cervical  spine were also generated. COMPARISON:  CT scan of October 05, 2018. FINDINGS: CT HEAD FINDINGS Brain: Mild diffuse cortical atrophy is noted. Minimal chronic ischemic white matter disease is noted. No mass effect or midline shift is noted. Ventricular size is within normal limits. There is no evidence of mass lesion, hemorrhage or acute infarction. Vascular: No hyperdense vessel or unexpected calcification. Skull: Normal. Negative for fracture or focal lesion. Sinuses/Orbits: No acute finding. Other: None. CT CERVICAL SPINE FINDINGS Alignment: Normal. Skull base and vertebrae: No acute fracture. No primary bone lesion or focal pathologic process. Soft tissues and spinal canal: No prevertebral fluid or swelling. No visible canal hematoma. Disc levels: Severe degenerative disc disease is noted at C5-6 and C6-7. Anterior and posterior osteophyte formation is noted at these levels. Upper chest: Negative. Other: Degenerative changes are seen involving posterior facet joints bilaterally. IMPRESSION: Mild diffuse cortical atrophy. Minimal chronic ischemic white matter disease. No acute intracranial abnormality seen. Severe multilevel degenerative disc disease. No acute abnormality seen in the cervical spine. Electronically Signed   By: Marijo Conception, M.D.   On: 03/09/2019 11:23   Ct Cervical Spine Wo Contrast  Result Date: 03/09/2019 CLINICAL DATA:  Unwitnessed fall. EXAM: CT HEAD WITHOUT CONTRAST CT CERVICAL SPINE WITHOUT CONTRAST TECHNIQUE: Multidetector CT imaging of the head and cervical spine was performed following the standard protocol without intravenous contrast. Multiplanar CT image reconstructions of the cervical spine were also generated. COMPARISON:  CT scan of October 05, 2018. FINDINGS: CT HEAD FINDINGS Brain: Mild diffuse cortical atrophy is noted. Minimal chronic ischemic white matter disease is noted. No mass effect or midline shift is noted. Ventricular size is within normal limits. There is no  evidence of mass lesion, hemorrhage or acute infarction. Vascular: No hyperdense vessel or unexpected calcification. Skull: Normal. Negative for fracture or focal lesion. Sinuses/Orbits: No acute finding. Other: None. CT CERVICAL SPINE FINDINGS Alignment: Normal. Skull base and vertebrae: No acute fracture. No primary bone lesion or focal pathologic process. Soft tissues and spinal canal: No prevertebral fluid or swelling. No visible canal hematoma. Disc levels: Severe degenerative disc disease is noted at C5-6 and C6-7. Anterior and posterior osteophyte formation is noted at these levels. Upper chest: Negative. Other: Degenerative changes are seen involving posterior facet joints bilaterally. IMPRESSION: Mild diffuse cortical atrophy. Minimal chronic ischemic white matter disease. No acute intracranial abnormality seen. Severe multilevel degenerative disc disease. No acute abnormality seen in the cervical spine. Electronically Signed   By: Marijo Conception, M.D.   On: 03/09/2019 11:23   Dg Chest Port 1 View  Result Date: 03/11/2019 CLINICAL DATA:  Shortness of breath EXAM: PORTABLE CHEST 1 VIEW COMPARISON:  03/10/2019 FINDINGS: Mild left infrahilar/lower lobe opacity suspected, although equivocal. Increased interstitial markings without frank interstitial edema. No pleural effusion or pneumothorax. Cardiomegaly. Status post ORIF of the left clavicle. Thoracolumbar fixation hardware, incompletely visualized. IMPRESSION: Mild left infrahilar/lower lobe opacity suspected, although equivocal. Pneumonia is not excluded in the appropriate clinical setting. Electronically Signed   By: Julian Hy M.D.   On: 03/11/2019 06:57   Dg Foot Complete Right  Result Date: 02/21/2019 Please see detailed radiograph report in office note.   Micro Results     Recent Results (from the past 240 hour(s))  Blood culture (routine x 2)  Status: Abnormal   Collection Time: 03/09/19 10:13 AM  Result Value Ref Range  Status   Specimen Description BLOOD LEFT FOREARM  Final   Special Requests   Final    BOTTLES DRAWN AEROBIC AND ANAEROBIC Blood Culture adequate volume   Culture  Setup Time   Final    ANAEROBIC BOTTLE ONLY GRAM POSITIVE RODS CRITICAL RESULT CALLED TO, READ BACK BY AND VERIFIED WITH: Karsten Ro [facility] 03/10/19 0101 JDW Performed at [facility], [place] [address] [place], [place] [address]    Culture CLOSTRIDIUM PERFRINGENS (A)  Final   Report Status 03/12/2019 FINAL  Final  Blood culture (routine x 2)     Status: None (Preliminary result)   Collection Time: 03/09/19 10:24 AM  Result Value Ref Range Status   Specimen Description BLOOD RIGHT HAND  Final   Special Requests   Final    BOTTLES DRAWN AEROBIC AND ANAEROBIC Blood Culture adequate volume   Culture   Final    NO GROWTH 4 DAYS Performed at [facility], [place] [address] [place], [place] [address]    Report Status PENDING  Incomplete  Urine Culture     Status: None   Collection Time: 03/09/19 11:36 AM  Result Value Ref Range Status   Specimen Description URINE, CATHETERIZED  Final   Special Requests NONE  Final   Culture   Final    NO GROWTH Performed at [facility], [place] [address], [place], [place] [address]    Report Status 03/10/2019 FINAL  Final    Today   Subjective    Tajana Crotteau today has no headache,no chest abdominal pain,no new weakness tingling or numbness, feels much better .   Objective   Blood pressure (!) 151/64, pulse 77, temperature 97.6 F (36.4 C), temperature source Oral, resp. rate 20, height 5' (1.524 m), weight 81.6 kg, SpO2 94 %.   Intake/Output Summary (Last 24 hours) at 03/13/2019 1157 Last data filed at 03/13/2019 0823 Gross per 24 hour  Intake 400 ml  Output --  Net 400 ml    Exam  Awake Alert, Oriented x 3, No new F.N deficits,   Petal.AT,PERRAL Supple Neck,No JVD, No cervical lymphadenopathy appriciated.  Symmetrical Chest wall movement, Good air movement  bilaterally, CTAB RRR,No Gallops,Rubs or new Murmurs, No Parasternal Heave +ve B.Sounds, Abd Soft, Non tender, No organomegaly appriciated, No rebound -guarding or rigidity. No Cyanosis, Clubbing, trace edema, chronic RA joint deformities in upper extremities especially hands and finger joints, No new Rash or bruise   Data Review   CBC w Diff:  Lab Results  Component Value Date   WBC 8.1 03/13/2019   HGB 10.1 (L) 03/13/2019   HCT 32.0 (L) 03/13/2019   PLT 105 (L) 03/13/2019   LYMPHOPCT 11 03/11/2019   MONOPCT 10 03/11/2019   EOSPCT 1 03/11/2019   BASOPCT 1 03/11/2019    CMP:  Lab Results  Component Value Date   NA 142 03/13/2019   K 3.4 (L) 03/13/2019   CL 114 (H) 03/13/2019   CO2 18 (L) 03/13/2019   BUN 20 03/13/2019   CREATININE 1.33 (H) 03/13/2019   PROT 5.4 (L) 03/10/2019   ALBUMIN 2.6 (L) 03/10/2019   BILITOT 0.7 03/10/2019   ALKPHOS 127 (H) 03/10/2019   AST 40 03/10/2019   ALT 45 (H) 03/10/2019  .   Total Time in preparing paper work, data evaluation and todays exam - 8 minutes  Lala Lund M.D on 03/13/2019 at  11:57 AM  [facility]   Office  [phone]

## 2019-03-13 NOTE — Progress Notes (Signed)
Pt prepared for DC to CIR. Report given to receiving nurse. IVs left intact. Tele monitor removed. All belongings taken with patient to [phone].

## 2019-03-13 NOTE — PMR Pre-admission (Signed)
PMR Admission Coordinator Pre-Admission Assessment  Patient: [name] [name] is an 82 y.o., female MRN: [id number] DOB: 07/21/1936 Height: 5' (152.4 cm) Weight: 81.6 kg  Insurance Information HMO:     PPO:      PCP:      IPA:      80/20:      OTHER: no HMO PRIMARY: Medicare a and b      Policy#: [id number]      Subscriber: pt Benefits:  Phone #: passport one online     Name: 03/13/2019 Eff. Date: 10/14/2003     Deduct: $1408      Out of Pocket Max: none      Life Max: none CIR: 100%      SNF: 20 full days Outpatient: 80%     Co-Pay: 20% Home Health: 100%      Co-Pay: none DME: 80%     Co-Pay: 20% Providers: pt choice  SECONDARY: [employer]      Policy#: [id number]      Subscriber: pt active indemnity plan 03/13/2019  Medicaid Application Date:       Case Manager:  Disability Application Date:       Case Worker:   The "Data Collection Information Summary" for patients in Inpatient Rehabilitation Facilities with attached "Privacy Act Statement-Health Care Records" was provided and verbally reviewed with: Patient  Emergency Contact Information Contact Information    Name Relation Home Work Mobile   Brammer,Catherine Daughter [phone]  [phone]      Current Medical History  Patient Admitting Diagnosis: Fall, Debility  History of Present Illness: 82 year old presented 03/09/2019 with a fall. History of CVAs, coronary artery disease, paroxysmal atrial fibrillation not on anticoagulation secondary to falls, HTN, heart failure with preserved EF, on multiple antianxiety meds,and  hypothyroidism .Patient fell off stool in her home and daughter was unable to get her up until 7 hours later when EMS was called to assist. Daughter reports decline in function with increased weakness and confusion, and tremors.   Patient was found to be hypothermic and confused. With no clear source of infection. Treated for sepsis one blood culture positive for gram positive rods which felt to be  contamination.  Felt accidental polypharmacy with narcotic and benzo overdose along with chronic alcohol use, TSH and random cortisol stable, and hypothermia resolved. Clinically no signs of sepsis. Possible mild aspiration pneumonia. To finish her oral Augmentin for 3 more days and 1 of 2 blood cultures positive but likely skin contaminant.  Due to dehydration resolved with IV fluids, but now with mild edema and received lasix on 3/31. Will need follow up repeat BMP on CIR.  Due to accidental polypharmacy along with possible alcohol withdrawal, DT's resolved, and to continue home dose of Librium. Patient had been extensively counseled to quit alcohol.  Paroxysmal atrial fibrillation and sinus tachycardia treated with beta blocker dose increased for better control. Patient also added with Cardizem with good rate control. On baby ASA.     Patient's medical record from [facility] has been reviewed by the rehabilitation admission coordinator and physician.  Past Medical History  Past Medical History:  Diagnosis Date  . Alopecia   . Anemia    hx of years ago   . Anxiety   . ARF (acute renal failure) (HCC) 08/23/2014  . Arthritis    psoriatic arthritis  . Back pain   . Blood transfusion    at age of 4  . Carotid stenosis 12/2016  .   Chronic diastolic CHF (congestive heart failure) (HCC)   . Depression   . Dysrhythmia 11/13/2016   PAFib for a short time- Echo done 11/14/16 PAF- to follow up with PCP- to see if she is a candiate for anticoags.  Not started at times time due to alcohol abuse.  . GERD (gastroesophageal reflux disease)   . H/O hiatal hernia   . History of acute cholangitis   . Hyperlipidemia   . Hypertension   . Hypothyroidism   . IBS (irritable bowel syndrome)   . Pancreatitis   . Peripheral vascular disease (HCC)   . PONV (postoperative nausea and vomiting)    with Breast reduction- only time  . Rosacea   . Seizures (HCC) 11/13/2016   Thopught to be from  withdrawl Benzodiazepine  . Stroke (HCC)    patient denies-  . Thyroid disease     Family History   family history includes Cancer in her father; Heart disease in her mother.  Prior Rehab/Hospitalizations Has the patient had prior rehab or hospitalizations prior to admission? Yes  Has the patient had major surgery during 100 days prior to admission? No   Current Medications  Current Facility-Administered Medications:  .  acetaminophen (TYLENOL) tablet 650 mg, 650 mg, Oral, Q6H PRN, Alexander, Natalie, DO, 650 mg at 03/13/19 0201 .  Ampicillin-Sulbactam (UNASYN) 3 g in sodium chloride 0.9 % 100 mL IVPB, 3 g, Intravenous, Q8H, Ledford, James L, RPH, Last Rate: 200 mL/hr at 03/13/19 0823, 3 g at 03/13/19 0823 .  aspirin EC tablet 81 mg, 81 mg, Oral, Daily, Alexander, Natalie, DO, 81 mg at 03/13/19 0835 .  chlordiazePOXIDE (LIBRIUM) capsule 10 mg, 10 mg, Oral, TID, Singh, Prashant K, MD, 10 mg at 03/13/19 0838 .  diltiazem (CARDIZEM) injection 10 mg, 10 mg, Intravenous, Q6H PRN, Singh, Prashant K, MD .  diltiazem (CARDIZEM) tablet 90 mg, 90 mg, Oral, Q8H, Singh, Prashant K, MD, 90 mg at 03/13/19 0627 .  FLUoxetine (PROZAC) capsule 20 mg, 20 mg, Oral, Daily, Ghimire, Kuber, MD, 20 mg at 03/13/19 0835 .  folic acid (FOLVITE) tablet 1 mg, 1 mg, Oral, Daily, Singh, Prashant K, MD, 1 mg at 03/13/19 0836 .  hydrALAZINE (APRESOLINE) tablet 50 mg, 50 mg, Oral, Q8H, Singh, Prashant K, MD, 50 mg at 03/13/19 0627 .  hyoscyamine (LEVBID) 0.375 MG 12 hr tablet 0.375 mg, 0.375 mg, Oral, BID PRN, Alexander, Natalie, DO .  levothyroxine (SYNTHROID, LEVOTHROID) tablet 75 mcg, 75 mcg, Oral, QAC breakfast, Alexander, Natalie, DO, 75 mcg at 03/13/19 0833 .  LORazepam (ATIVAN) injection 2-3 mg, 2-3 mg, Intravenous, Q1H PRN, Alexander, Natalie, DO, 2 mg at 03/11/19 1842 .  metoprolol succinate (TOPROL-XL) 24 hr tablet 75 mg, 75 mg, Oral, Daily, Singh, Prashant K, MD, 75 mg at 03/13/19 0837 .  metoprolol tartrate  (LOPRESSOR) injection 5 mg, 5 mg, Intravenous, Q6H PRN, Singh, Prashant K, MD, 5 mg at 03/11/19 1803 .  ondansetron (ZOFRAN) tablet 8 mg, 8 mg, Oral, Q8H PRN, Alexander, Natalie, DO .  pantoprazole (PROTONIX) EC tablet 40 mg, 40 mg, Oral, Daily, Alexander, Natalie, DO, 40 mg at 03/13/19 0837 .  QUEtiapine (SEROQUEL) tablet 25 mg, 25 mg, Oral, QHS, Alexander, Natalie, DO, 25 mg at 03/12/19 2304 .  simvastatin (ZOCOR) tablet 10 mg, 10 mg, Oral, QHS, Alexander, Natalie, DO, 10 mg at 03/12/19 2310 .  sodium chloride flush (NS) 0.9 % injection 3 mL, 3 mL, Intravenous, Q12H, Alexander, Natalie, DO, 3 mL at 03/11/19 2143 .  thiamine (VITAMIN   B-1) tablet 100 mg, 100 mg, Oral, Daily, Thurnell Lose, MD, 100 mg at 03/13/19 [id number] .  venlafaxine XR (EFFEXOR-XR) 24 hr capsule 37.5 mg, 37.5 mg, Oral, Q breakfast, Emeterio Reeve, DO, 37.5 mg at 03/13/19 6599  Patients Current Diet:  Diet Order            Diet - low sodium heart healthy        Diet Heart Room service appropriate? No; Fluid consistency: Thin  Diet effective now              Precautions / Restrictions Precautions Precautions: Fall Restrictions Weight Bearing Restrictions: No   Has the patient had 2 or more falls or a fall with injury in the past year? Yes  Prior Activity Level Limited Community (1-2x/wk): Patient decline in funciton since death of spouse 12-10-18. Used RW, very sedentary. Assisted with bathing and dressing. Incontinent using depends. Decline in function over 4 months, but especially 2 weeks pta.  Prior Functional Level Self Care: Did the patient need help bathing, dressing, using the toilet or eating? Needed some help  Indoor Mobility: Did the patient need assistance with walking from room to room (with or without device)? Needed some help  Stairs: Did the patient need assistance with internal or external stairs (with or without device)? Needed some help  Functional Cognition: Did the patient need  help planning regular tasks such as shopping or remembering to take medications? Needed some help  Home Assistive Devices / [place] Devices/Equipment: None Home Equipment: Walker - 2 wheels, Cane - single point  Prior Device Use: Indicate devices/aids used by the patient prior to current illness, exacerbation or injury? Walker  Current Functional Level Cognition  Overall Cognitive Status: Impaired/Different from baseline Current Attention Level: Selective Orientation Level: Oriented X4 Following Commands: Follows one step commands with increased time Safety/Judgement: Decreased awareness of safety, Decreased awareness of deficits General Comments: Pt not requiring as many cues this session to perform tasks. Was very interactive throughout.     Extremity Assessment (includes Sensation/Coordination)  Upper Extremity Assessment: Generalized weakness  Lower Extremity Assessment: Generalized weakness RLE Deficits / Details: unable to take steps without shifting wt laterally to have maxA from PT to move RLE. RLE Coordination: decreased fine motor, decreased gross motor    ADLs  Overall ADL's : Needs assistance/impaired Eating/Feeding: Minimal assistance, Sitting(hands with increased swelling and arthritis) Grooming: Wash/dry hands, Wash/dry face, Oral care, Moderate assistance, Sitting(hands with increased swelling and arthritis) Upper Body Bathing: Maximal assistance, Sitting Lower Body Bathing: Maximal assistance, Total assistance, Sitting/lateral leans Upper Body Dressing : Moderate assistance, Sitting Lower Body Dressing: Maximal assistance, Total assistance, Sitting/lateral leans Toilet Transfer: +2 for safety/equipment, Moderate assistance, Minimal assistance Toilet Transfer Details (indicate cue type and reason): Simulated with stand pivot transfer from recliner to bed.  Toileting- Clothing Manipulation and Hygiene: +2 for safety/equipment, Total assistance, Sit  to/from stand Toileting - Clothing Manipulation Details (indicate cue type and reason): One person assist with balance while second person assist with back peri care due to loose stool. Functional mobility during ADLs: +2 for safety/equipment, Minimal assistance, Rolling walker, Cueing for sequencing, Cueing for safety General ADL Comments: Pt up in recliner upon OT arrival.  Pt requesting to get up to relieve pressure on her bottom.  In standing, she requires total assist to complete back peri care (+2 assist for balance and for peri care).     Mobility  Overal bed mobility: Needs Assistance Bed Mobility: Sit to  Supine Sidelying to sit: Mod assist, +2 for physical assistance Supine to sit: Max assist, +2 for physical assistance Sit to supine: Min assist General bed mobility comments: Assist to bring LEs back into bed and to control descent of trunk into bed.     Transfers  Overall transfer level: Needs assistance Equipment used: Rolling walker (2 wheeled) Transfer via Lift Equipment: Stedy Transfers: Sit to/from Stand, Stand Pivot Transfers Sit to Stand: +2 physical assistance, Min assist, Mod assist Stand pivot transfers: +2 physical assistance, Min assist General transfer comment: +2 min assist to perform sit<>stand x 2 and mod assist with final/third sit<>stand.  Performs pivot from recliner to bed with +2 min assist for safety.     Ambulation / Gait / Stairs / Wheelchair Mobility  Ambulation/Gait General Gait Details: unable    Posture / Balance Balance Overall balance assessment: Needs assistance Sitting-balance support: No upper extremity supported, Feet supported Sitting balance-Leahy Scale: Fair Standing balance support: Bilateral upper extremity supported, During functional activity Standing balance-Leahy Scale: Poor Standing balance comment: using RW as Bilateral UE support    Special needs/care consideration BiPAP/CPAP  N/a CPM  N/a Continuous Drip IV  N/a Dialysis  n/a Life Vest  N/a Oxygen  N/a Special Bed  N/a Trach Size  N/a Wound Vac n/a Skin  MASD groin, R LE cellulitis, cracking heels bilaterally; ecchymosis to surrounding eyes and face; ecchymosis to BUE , especially to RUE Bowel mgmt:  Incontinent LBM 3/30 Bladder mgmt:  External catheter Diabetic mgmt: m/a Behavioral consideration  Tearful speaking of the death of spouse 112/23/2019 Chemo/radiation  N/a   Previous Home Environment  Living Arrangements: Children(daughter lives with patietn since December 2019 right before)  Lives With: Daughter(daughter has moved in with patient) Available Help at Discharge: Family, Available 24 hours/day(daughter unemployed; has seperate apartment but living with ) Type of Home: House Home Layout: Full bath on main level Home Access: Stairs to enter Entrance Stairs-Number of Steps: 5 Bathroom Shower/Tub: Tub/shower unit Bathroom Toilet: Standard Bathroom Accessibility: Yes How Accessible: Accessible via walker Home Care Services: No Additional Comments: patietn sleeps on couch on main level; was doing this over past few months  Discharge Living Setting Plans for Discharge Living Setting: Patient's home, Lives with (comment)(daughter living with patient over past 4 months with pt's sp) Type of Home at Discharge: House Discharge Home Layout: Two level, Full bath on main level Discharge Home Access: Stairs to enter Entrance Stairs-Number of Steps: 5 Discharge Bathroom Shower/Tub: Tub/shower unit Discharge Bathroom Toilet: Standard Discharge Bathroom Accessibility: Yes How Accessible: Accessible via walker Does the patient have any problems obtaining your medications?: No  Social/Family/Support Systems Patient Roles: Parent Contact Information: Catherine, daughter Anticipated Caregiver: daughter Anticipated Caregiver's Contact Information: [phone] Ability/Limitations of Caregiver: daughter unemployed; was lving in own apartment but with pt  over past few months Caregiver Availability: 24/7 Discharge Plan Discussed with Primary Caregiver: Yes Is Caregiver In Agreement with Plan?: Yes Does Caregiver/Family have Issues with Lodging/Transportation while Pt is in Rehab?: No   Patient very tearful and sates depressed since the death of there spouse 12/04/2018. Daughter has separate apartment, but has moved in with patient since death of her Dad.  Goals/Additional Needs Patient/Family Goal for Rehab: supervision to min asisst with PT , min asisst with OT, supervision with SLP Expected length of stay: ELOS 14 to 20 days Special Service Needs: patient admites depression, sees psychiatrist for years; admits more depressed with spouse's death Christmas Eve 2019 Additional Information: Patient admits drinks   daily ; wine Pt/Family Agrees to Admission and willing to participate: Yes Program Orientation Provided & Reviewed with Pt/Caregiver Including Roles  & Responsibilities: Yes  Decrease burden of Care through IP rehab admission: n/a  Possible need for SNF placement upon discharge: if patient does not reach supervision to min assist level, daughter may consider SNF  Patient Condition: I have reviewed medical records from [facility] , spoken with patient, and daughter. I met with patient at the bedside  for inpatient rehabilitation assessment.  Patient will benefit from ongoing PT, OT, and SLP), can actively participate in 3 hours of therapy a day 5 days of the week, and can make measurable gains during the admission.  Patient will also benefit from the coordinated team approach during an Inpatient Acute Rehabilitation admission.  The patient will receive intensive therapy as well as Rehabilitation physician, nursing, social worker, and care management interventions.  Due to bowel management, bladder management, safety, skin/wound care, disease management, medical administration, pain management, patient education the patient requires  24 hour a day rehabilitation nursing.  The patient is currently mod assist with mobility and basic ADLs.  Discharge setting and therapy post discharge at home with home health is anticipated.  Patient has agreed to participate in the Acute Inpatient Rehabilitation Program and will admit today.  Preadmission Screen Completed By:  Cleatrice Burke, 03/13/2019 12:51 PM ______________________________________________________________________   Discussed status with Dr. Naaman Plummer  on  03/13/2019 at  56 and received approval for admission today.  Admission Coordinator:  Cleatrice Burke, RN, MSN time  1309 Date  03/13/2019   Assessment/Plan: Diagnosis: Functional deficits due to debility from unintentional overdose and multiple medical  1. Does the need for close, 24 hr/day Medical supervision in concert with the patient's rehab needs make it unreasonable for this patient to be served in a less intensive setting? Yes 2. Co-Morbidities requiring supervision/potential complications: hx cva's, CAD, pAF, htn, chf 3. Due to bladder management, bowel management, safety, skin/wound care, disease management, medication administration, pain management and patient education, does the patient require 24 hr/day rehab nursing? Yes 4. Does the patient require coordinated care of a physician, rehab nurse, PT (1-2 hrs/day, 5 days/week), OT (1-2 hrs/day, 5 days/week) and SLP (1-2 hrs/day, 5 days/week) to address physical and functional deficits in the context of the above medical diagnosis(es)? Yes Addressing deficits in the following areas: balance, endurance, locomotion, strength, transferring, bowel/bladder control, bathing, dressing, feeding, grooming, toileting, cognition, speech and psychosocial support 5. Can the patient actively participate in an intensive therapy program of at least 3 hrs of therapy 5 days a week? Yes 6. The potential for patient to make measurable gains while on inpatient rehab is  excellent 7. Anticipated functional outcomes upon discharge from inpatients are: supervision and min assist PT, min assist OT, supervision SLP 8. Estimated rehab length of stay to reach the above functional goals is: 14-20 days 9. Anticipated D/C setting: Home 10. Anticipated post D/C treatments: [place] therapy 11. Overall Rehab/Functional Prognosis: excellent  MD Signature: Meredith Staggers, MD, [facility] 03/13/2019

## 2019-03-13 NOTE — Progress Notes (Signed)
Occupational Therapy Treatment Patient Details Name: [name] [name] MRN: [id number] DOB: Jun 09, 1936 Today's Date: 03/13/2019    History of present illness Pt is an 83 yo female s/p fall found down by daughter awaiting 7 hours to be assisted by neighbors; pt found to be hypothermic, confused and very weak. Pt PMHx: alcoholism, back sx, paraoxysmal Afib, falls, HTN, CHF, hypothyroidism, anxiety.   OT comments  Pt continues to progress toward goals.  Focus of session on bilateral UE exercise with theraband. Pt able to complete functional mobility with min assist using RW today. She plans to discharge to CIR later today. Will continue to follow acutely.  Follow Up Recommendations  CIR;Supervision/Assistance - 24 hour    Equipment Recommendations  Other (comment)(defer to next venue)    Recommendations for Other Services      Precautions / Restrictions Precautions Precautions: Fall Restrictions Weight Bearing Restrictions: No       Mobility Bed Mobility               General bed mobility comments: pt up in chair  Transfers Overall transfer level: Needs assistance Equipment used: Rolling walker (2 wheeled) Transfers: Sit to/from Stand Sit to Stand: Min assist         General transfer comment: Assist for power up and for steadying. Cues for hand placement.    Balance Overall balance assessment: Needs assistance Sitting-balance support: No upper extremity supported;Feet supported Sitting balance-Leahy Scale: Fair     Standing balance support: Bilateral upper extremity supported;During functional activity Standing balance-Leahy Scale: Poor Standing balance comment: using RW as Bilateral UE support                           ADL either performed or assessed with clinical judgement   ADL                           Toilet Transfer: Minimal assistance;Ambulation;RW Toilet Transfer Details (indicate cue type and reason): Simulated with functional  mobility within room         Functional mobility during ADLs: Minimal assistance;Rolling walker General ADL Comments: Pt sitting up in recliner upon therapist arrival.  She states she would like to stand and "give her bottom a break."  Pt able to ambulate ~5 ft in room with RW while she was up.       Vision       Perception     Praxis      Cognition Arousal/Alertness: Awake/alert Behavior During Therapy: WFL for tasks assessed/performed Overall Cognitive Status: Impaired/Different from baseline Area of Impairment: Following commands                       Following Commands: Follows multi-step commands with increased time                Exercises Exercises: General Upper Extremity General Exercises - Upper Extremity Shoulder Flexion: AROM;Both;10 reps;Theraband;Seated Theraband Level (Shoulder Flexion): Level 1 (Yellow) Shoulder Horizontal ABduction: AROM;Both;10 reps;Theraband;Seated Theraband Level (Shoulder Horizontal Abduction): Level 1 (Yellow) Shoulder Horizontal ADduction: AROM;Both;10 reps;Theraband;Seated Theraband Level (Shoulder Horizontal Adduction): Level 1 (Yellow)   Shoulder Instructions       General Comments      Pertinent Vitals/ Pain       Pain Assessment: Faces Faces Pain Scale: Hurts little more Pain Location: right UE  Pain Descriptors / Indicators: Discomfort Pain Intervention(s): Monitored during session;Limited activity within  patient's tolerance  Home Living   Living Arrangements: Children(daughter lives with patietn since December 2019 right before) Available Help at Discharge: Family;Available 24 hours/day(daughter unemployed; has [place] apartment but living with )         Home Layout: Full bath on main level           Bathroom Accessibility: Yes How Accessible: Accessible via walker     Additional Comments: patietn sleeps on couch on main level; was doing this over past few months  Lives With:  Daughter(daughter has moved in with patient)    Prior Functioning/Environment      ADL's / Homemaking Assistance Needed: asissted by daughter over past 4 months       Frequency  Min 3X/week        Progress Toward Goals  OT Goals(current goals can now be found in the care plan section)  Progress towards OT goals: Progressing toward goals  Acute Rehab OT Goals Patient Stated Goal: "to go home" OT Goal Formulation: With patient Time For Goal Achievement: 03/24/19 Potential to Achieve Goals: Fair ADL Goals Pt Will Perform Eating: with modified independence Pt Will Perform Grooming: with modified independence Pt Will Transfer to Toilet: with min assist Pt Will Perform Toileting - Clothing Manipulation and hygiene: with min assist;sit to/from stand Pt/caregiver will Perform Home Exercise Program: Both right and left upper extremity;With written HEP provided;With Supervision Additional ADL Goal #1: Pt will perform 10 mins of sustained functional tasks in order to increase activity tolerance with intermittent standing.  Plan Discharge plan remains appropriate    Co-evaluation                 AM-PAC OT "6 Clicks" Daily Activity     Outcome Measure   Help from another person eating meals?: A Little Help from another person taking care of personal grooming?: A Lot Help from another person toileting, which includes using toliet, bedpan, or urinal?: A Little Help from another person bathing (including washing, rinsing, drying)?: A Lot Help from another person to put on and taking off regular upper body clothing?: A Little Help from another person to put on and taking off regular lower body clothing?: A Lot 6 Click Score: 15    End of Session Equipment Utilized During Treatment: Rolling walker  OT Visit Diagnosis: Unsteadiness on feet (R26.81);Muscle weakness (generalized) (M62.81)   Activity Tolerance Patient tolerated treatment well   Patient Left in chair;with call  bell/phone within reach;with chair alarm set   Nurse Communication          Time: 1135-1200 OT Time Calculation (min): 25 min  Charges: OT General Charges $OT Visit: 1 Visit OT Treatments $Therapeutic Activity: 8-22 mins $Therapeutic Exercise: 8-22 mins     Darrol Jump OTR/L   [place] [phone] 03/13/2019, 2:19 PM

## 2019-03-13 NOTE — Discharge Instructions (Signed)
Follow with Primary MD [name], MD in 7 days   Get CBC, CMP, 2 view Chest X ray -  checked  by Primary MD or CIR MD in 5-7 days    Activity: As tolerated with Full fall precautions use walker/cane & assistance as needed  Disposition CIR   Diet: Heart Healthy   Special Instructions: If you have smoked or chewed Tobacco  in the last 2 yrs please stop smoking, stop any regular Alcohol  and or any Recreational drug use.  On your next visit with your primary care physician please Get Medicines reviewed and adjusted.  Please request your Prim.MD to go over all Hospital Tests and Procedure/Radiological results at the follow up, please get all Hospital records sent to your Prim MD by signing hospital release before you go home.  If you experience worsening of your admission symptoms, develop shortness of breath, life threatening emergency, suicidal or homicidal thoughts you must seek medical attention immediately by calling 911 or calling your MD immediately  if symptoms less severe.  You Must read complete instructions/literature along with all the possible adverse reactions/side effects for all the Medicines you take and that have been prescribed to you. Take any new Medicines after you have completely understood and accpet all the possible adverse reactions/side effects.

## 2019-03-13 NOTE — TOC Progression Note (Signed)
Transition of Care [facility]) - Progression Note    Patient Details  Name: [name] [name] MRN: [id number] Date of Birth: [birth year]  Transition [facility]) CM/SW [place], LCSW Phone Number: 03/13/2019, 4:10 PM  Clinical Narrative:     CSW notes patient is discharging to CIR. CSW signing off.   Expected Discharge Plan: IP Rehab Facility Barriers to Discharge: Continued Medical Work up  Expected Discharge Plan and Services Expected Discharge Plan: [place] In-house Referral: Clinical Social Work Discharge Planning Services: NA Post Acute Care Choice: [place] arrangements for the past 2 months: Apartment Expected Discharge Date: 03/13/19               DME Arranged: N/A DME Agency: NA HH Arranged: NA HH Agency: NA   Social Determinants of Health (SDOH) Interventions    Readmission Risk Interventions Readmission Risk Prevention Plan 03/12/2019  Transportation Screening Complete  PCP or Specialist Appt within 3-5 Days Complete  HRI or [place] Complete  Social Work Consult for [place] Planning/Counseling Complete  Palliative Care Screening Not Applicable  Medication Review Press photographer) Complete  Some recent data might be hidden

## 2019-03-13 NOTE — Progress Notes (Signed)
Physical Therapy Treatment Patient Details Name: [name] [name] MRN: [id number] DOB: 1936-08-12 Today's Date: 03/13/2019    History of Present Illness Pt is an 83 yo female s/p fall found down by daughter awaiting 7 hours to be assisted by neighbors; pt found to be hypothermic, confused and very weak. Pt PMHx: alcoholism, back sx, paraoxysmal Afib, falls, HTN, CHF, hypothyroidism, anxiety.    PT Comments    Patient seen for mobility progression. Pt is making progress toward PT goals and will continue to benefit from further skilled PT services in both acute and post acute settings.   Follow Up Recommendations  SNF     Equipment Recommendations  None recommended by PT    Recommendations for Other Services       Precautions / Restrictions Precautions Precautions: Fall Restrictions Weight Bearing Restrictions: No    Mobility  Bed Mobility Overal bed mobility: Needs Assistance Bed Mobility: Sit to Supine       Sit to supine: Min guard   General bed mobility comments: min guard for safety  Transfers Overall transfer level: Needs assistance Equipment used: Rolling walker (2 wheeled) Transfers: Sit to/from Stand Sit to Stand: Min assist         General transfer comment: assist to steady; cues for safe hand placement; pt with posterior bias  Ambulation/Gait Ambulation/Gait assistance: Min assist;+2 safety/equipment;Mod assist(chair follow) Gait Distance (Feet): 50 Feet Assistive device: Rolling walker (2 wheeled) Gait Pattern/deviations: Step-through pattern;Decreased step length - right;Decreased step length - left;Decreased dorsiflexion - right;Decreased dorsiflexion - left Gait velocity: decreased   General Gait Details: assist required for balance; pt with slight ataxia while ambualting; posterior bias   Stairs             Wheelchair Mobility    Modified Rankin (Stroke Patients Only)       Balance Overall balance assessment: Needs  assistance Sitting-balance support: No upper extremity supported;Feet supported Sitting balance-Leahy Scale: Fair     Standing balance support: Bilateral upper extremity supported;During functional activity Standing balance-Leahy Scale: Poor Standing balance comment: using RW as Bilateral UE support                            Cognition Arousal/Alertness: Awake/alert Behavior During Therapy: WFL for tasks assessed/performed Overall Cognitive Status: Impaired/Different from baseline Area of Impairment: Following commands                       Following Commands: Follows multi-step commands with increased time              Exercises General Exercises - Upper Extremity Shoulder Flexion: AROM;Both;10 reps;Theraband;Seated Theraband Level (Shoulder Flexion): Level 1 (Yellow) Shoulder Horizontal ABduction: AROM;Both;10 reps;Theraband;Seated Theraband Level (Shoulder Horizontal Abduction): Level 1 (Yellow) Shoulder Horizontal ADduction: AROM;Both;10 reps;Theraband;Seated Theraband Level (Shoulder Horizontal Adduction): Level 1 (Yellow)    General Comments        Pertinent Vitals/Pain Pain Assessment: Faces Faces Pain Scale: Hurts little more Pain Location: right UE  Pain Descriptors / Indicators: Discomfort Pain Intervention(s): Monitored during session;Repositioned    Home Living   Living Arrangements: Children(daughter lives with patietn since December 2019 right before) Available Help at Discharge: Family;Available 24 hours/day(daughter unemployed; has [place] apartment but living with )       Home Layout: Full bath on main level   Additional Comments: patietn sleeps on couch on main level; was doing this over past few months    Prior Function  ADL's / Homemaking Assistance Needed: asissted by daughter over past 4 months     PT Goals (current goals can now be found in the care plan section) Acute Rehab PT Goals Patient Stated Goal: "to  go home" Progress towards PT goals: Progressing toward goals    Frequency    Min 2X/week      PT Plan Current plan remains appropriate    Co-evaluation              AM-PAC PT "6 Clicks" Mobility   Outcome Measure  Help needed turning from your back to your side while in a flat bed without using bedrails?: A Little Help needed moving from lying on your back to sitting on the side of a flat bed without using bedrails?: A Little Help needed moving to and from a bed to a chair (including a wheelchair)?: A Little Help needed standing up from a chair using your arms (e.g., wheelchair or bedside chair)?: A Little Help needed to walk in hospital room?: A Little Help needed climbing 3-5 steps with a railing? : A Lot 6 Click Score: 17    End of Session Equipment Utilized During Treatment: Gait belt Activity Tolerance: Patient tolerated treatment well Patient left: with call bell/phone within reach;in bed;with bed alarm set Nurse Communication: Mobility status PT Visit Diagnosis: Other abnormalities of gait and mobility (R26.89);Muscle weakness (generalized) (M62.81)     Time: 1610-9604 PT Time Calculation (min) (ACUTE ONLY): 32 min  Charges:  $Gait Training: 23-37 mins                     Earney Navy, PTA Acute Rehabilitation Services Pager: [phone] Office: [phone]     Darliss Cheney 03/13/2019, 4:35 PM

## 2019-03-13 NOTE — Progress Notes (Signed)
PROGRESS NOTE    [name] [name]  QIH:[id number] DOB: 10/11/1936 DOA: 03/09/2019 PCP: [name], MD    Brief Narrative:  Patient is a 83 year old female with history of previous strokes, coronary artery disease, paroxysmal atrial fibrillation not on anticoagulation secondary to fall, hypertension, heart failure with preserved ejection fraction, on multiple antianxiety medications, hypothyroidism who was brought to the hospital where she was found falling on the porch and laying on floor for a while.  EMS found her hypothermic.  Patient was confused.  No clear source of infection.  She has been admitted and treated as sepsis.  1 of the blood cultures positive for gram-positive rods.   Subjective: Patient in bed, appears comfortable, denies any headache, no fever, no chest pain or pressure, no shortness of breath , no abdominal pain. No focal weakness.   Assessment & Plan:   Hypothermia: Was likely due to accidental polypharmacy with narcotic and benzo overdose along with alcohol use, TSH and random cortisol stable, hypothermia with supportive care has resolved.  Clinically no signs of sepsis.  Could have had mild aspiration pneumonia but clinically stable.  Counseled to quit alcohol, taper down narcotics and benzodiazepines as much as possible.  Seen by PT will require SNF.  Clinically much improved.  Acute kidney injury: Due to dehydration resolved with IV fluids.  Altered mental status: No focal neurological deficit.  CT scan of the head neck and a skeletal survey was negative.  Due to accidental polypharmacy along with possible alcohol withdrawal, stable minimize medications.  CIWA protocol along with scheduled Librium.  Abnormal liver function test: Probably due to #1.  Repeat test with improvement.  Chronic diastolic heart failure 87% on recent echocardiogram: Lasix x 1 - 03/13/19  Hypertension history of paroxysmal atrial fibrillation and sinus tachycardia: Beta blocker dose  increased for better control.  The Lopressor and Cardizem ordered for rate control if needed.  Mali vas 2 score will be 3 but she is not on any anticoagulation due to fall risk.  On baby aspirin.  ? Asp PNA - clinically stable, continue Unasyn, stop all other antibiotics, speech eval.  Hypokalemia - replace     Previous MD had discussed the case with patient's daughter and discussed in details.  She stated that patient never had altered mentation in the past.  She states that patient was mostly sedentary, however with no other preceding illness.  Advance activities today with PT OT.  Continue to monitor.   DVT prophylaxis: SCDs Code Status: Full code Family Communication: Previous MD daughter over the phone.  Her husband is deceased. Disposition Plan: SNF.   Consultants:   None.  Procedures:   None.  Anti-infectives (From admission, onward)   Start     Dose/Rate Route Frequency Ordered Stop   03/11/19 0645  Ampicillin-Sulbactam (UNASYN) 3 g in sodium chloride 0.9 % 100 mL IVPB     3 g 200 mL/hr over 30 Minutes Intravenous Every 8 hours 03/11/19 0641     03/10/19 1630  cefTRIAXone (ROCEPHIN) 2 g in sodium chloride 0.9 % 100 mL IVPB  Status:  Discontinued     2 g 200 mL/hr over 30 Minutes Intravenous Every 24 hours 03/09/19 1612 03/11/19 0641   03/10/19 1500  vancomycin (VANCOCIN) 1,250 mg in sodium chloride 0.9 % 250 mL IVPB  Status:  Discontinued     1,250 mg 166.7 mL/hr over 90 Minutes Intravenous Every 24 hours 03/09/19 1530 03/11/19 0627   03/09/19 1615  cefTRIAXone (ROCEPHIN) 1 g  in sodium chloride 0.9 % 100 mL IVPB     1 g 200 mL/hr over 30 Minutes Intravenous STAT 03/09/19 1612 03/09/19 2016   03/09/19 1515  cefTRIAXone (ROCEPHIN) 1 g in sodium chloride 0.9 % 100 mL IVPB  Status:  Discontinued     1 g 200 mL/hr over 30 Minutes Intravenous Every 24 hours 03/09/19 1502 03/09/19 1613   03/09/19 1500  vancomycin (VANCOCIN) 1,500 mg in sodium chloride 0.9 % 500 mL IVPB      1,500 mg 250 mL/hr over 120 Minutes Intravenous  Once 03/09/19 1456 03/10/19 0132      Objective: Vitals:   03/12/19 2300 03/12/19 2305 03/13/19 0627 03/13/19 0651  BP: (!) 172/70 (!) 170/70 (!) 151/64   Pulse:    77  Resp: (!) 23   20  Temp:    97.6 F (36.4 C)  TempSrc:    Oral  SpO2:      Weight:      Height:        Intake/Output Summary (Last 24 hours) at 03/13/2019 0913 Last data filed at 03/13/2019 9211 Gross per 24 hour  Intake 640 ml  Output -  Net 640 ml   Filed Weights   03/09/19 0940  Weight: 81.6 kg    Examination:  Awake Alert, Oriented X 3, No new F.N deficits, Normal affect .AT,PERRAL Supple Neck,No JVD, No cervical lymphadenopathy appriciated.  Symmetrical Chest wall movement, Good air movement bilaterally, CTAB RRR,No Gallops, Rubs or new Murmurs, No Parasternal Heave +ve B.Sounds, Abd Soft, No tenderness, No organomegaly appriciated, No rebound - guarding or rigidity. No Cyanosis, Clubbing or edema, No new Rash or bruise   Data Reviewed: I have personally reviewed following labs and imaging studies  CBC: Recent Labs  Lab 03/09/19 1016 03/10/19 0328 03/11/19 0353 03/12/19 0516 03/13/19 0542  WBC 6.6 9.9 7.7 11.8* 8.1  NEUTROABS 5.6  --  5.9  --   --   HGB 12.1 11.8* 11.3* 11.3* 10.1*  HCT 37.6 36.0 34.4* 35.7* 32.0*  MCV 95.2 94.2 97.5 96.5 98.8  PLT 94* 116* 90* 101* 941*   Basic Metabolic Panel: Recent Labs  Lab 03/09/19 1016 03/10/19 0328 03/11/19 0353 03/12/19 0516 03/13/19 0542  NA 139 143 142 141 142  K 4.1 5.0 4.1 3.5 3.4*  CL 108 117* 115* 113* 114*  CO2 16* 17* 16* 18* 18*  GLUCOSE 109* 99 110* 113* 108*  BUN 38* 34* 24* 22 20  CREATININE 0.88 1.43* 1.23* 1.18* 1.33*  CALCIUM 8.6* 7.8* 8.5* 8.7* 8.4*  MG  --   --  1.8  --   --    GFR: Estimated Creatinine Clearance: 31.9 mL/min (A) (by C-G formula based on SCr of 1.33 mg/dL (H)). Liver Function Tests: Recent Labs  Lab 03/09/19 1016 03/10/19 0328  AST 46*  40  ALT 50* 45*  ALKPHOS 132* 127*  BILITOT 0.6 0.7  PROT 5.8* 5.4*  ALBUMIN 2.7* 2.6*   No results for input(s): LIPASE, AMYLASE in the last 168 hours. No results for input(s): AMMONIA in the last 168 hours. Coagulation Profile: No results for input(s): INR, PROTIME in the last 168 hours. Cardiac Enzymes: Recent Labs  Lab 03/09/19 1016  CKTOTAL 194  TROPONINI <0.03   BNP (last 3 results) No results for input(s): PROBNP in the last 8760 hours. HbA1C: No results for input(s): HGBA1C in the last 72 hours. CBG: No results for input(s): GLUCAP in the last 168 hours. Lipid Profile: No results for  input(s): CHOL, HDL, LDLCALC, TRIG, CHOLHDL, LDLDIRECT in the last 72 hours. Thyroid Function Tests: No results for input(s): TSH, T4TOTAL, FREET4, T3FREE, THYROIDAB in the last 72 hours. Anemia Panel: No results for input(s): VITAMINB12, FOLATE, FERRITIN, TIBC, IRON, RETICCTPCT in the last 72 hours. Sepsis Labs: Recent Labs  Lab 03/09/19 1019 03/09/19 1519 03/09/19 1920  LATICACIDVEN 2.5* 1.4 1.5    Recent Results (from the past 240 hour(s))  Blood culture (routine x 2)     Status: Abnormal   Collection Time: 03/09/19 10:13 AM  Result Value Ref Range Status   Specimen Description BLOOD LEFT FOREARM  Final   Special Requests   Final    BOTTLES DRAWN AEROBIC AND ANAEROBIC Blood Culture adequate volume   Culture  Setup Time   Final    ANAEROBIC BOTTLE ONLY GRAM POSITIVE RODS CRITICAL RESULT CALLED TO, READ BACK BY AND VERIFIED WITH: Karsten Ro [facility] 03/10/19 0101 JDW Performed at [facility], [place] [address], [place], [place] [address]    Culture CLOSTRIDIUM PERFRINGENS (A)  Final   Report Status 03/12/2019 FINAL  Final  Blood culture (routine x 2)     Status: None (Preliminary result)   Collection Time: 03/09/19 10:24 AM  Result Value Ref Range Status   Specimen Description BLOOD RIGHT HAND  Final   Special Requests   Final    BOTTLES DRAWN AEROBIC AND ANAEROBIC  Blood Culture adequate volume   Culture   Final    NO GROWTH 4 DAYS Performed at [facility], [place] [address], [place], [place] [address]    Report Status PENDING  Incomplete  Urine Culture     Status: None   Collection Time: 03/09/19 11:36 AM  Result Value Ref Range Status   Specimen Description URINE, CATHETERIZED  Final   Special Requests NONE  Final   Culture   Final    NO GROWTH Performed at [facility], [place] [address] [place], [place] [address]    Report Status 03/10/2019 FINAL  Final     Radiology Studies: No results found. Scheduled Meds: . aspirin EC  81 mg Oral Daily  . chlordiazePOXIDE  10 mg Oral TID  . diltiazem  90 mg Oral Q8H  . FLUoxetine  20 mg Oral Daily  . folic acid  1 mg Oral Daily  . hydrALAZINE  50 mg Oral Q8H  . levothyroxine  75 mcg Oral QAC breakfast  . metoprolol succinate  75 mg Oral Daily  . pantoprazole  40 mg Oral Daily  . potassium chloride  40 mEq Oral BID  . QUEtiapine  25 mg Oral QHS  . simvastatin  10 mg Oral QHS  . sodium chloride flush  3 mL Intravenous Q12H  . thiamine  100 mg Oral Daily  . venlafaxine XR  37.5 mg Oral Q breakfast   Continuous Infusions: . ampicillin-sulbactam (UNASYN) IV 3 g (03/13/19 0823)     LOS: 4 days    Time spent: 25 minutes   Signature  Lala Lund M.D on 03/13/2019 at 9:13 AM   -  To page go to [email or web address]

## 2019-03-13 NOTE — Progress Notes (Signed)
Inpatient Rehabilitation Admissions Coordinator  I spoke with patient at bedside and spoke with her daughter, [name], by phone. Both in agreement to admit to CIR . I spoke with Dr. Candiss Norse and he will arrange d/c to CIR today. I have notified RN CM and SW and will make th arrangements to admit today.  Danne Baxter, RN, MSN Rehab Admissions Coordinator [phone] 03/13/2019 11:46 AM

## 2019-03-13 NOTE — H&P (Signed)
Physical Medicine and Rehabilitation Admission H&P        Chief Complaint  Patient presents with  . Fall      HPI:  [name] [name] is an 83 year old female with history of HTN, PAF--no AC due to falls, psoriatic arthritis, anxiety/depression, chronic diastolic CHF, who was admitted on 03/09/19 after falling in the middle of the night and laid on the tile floor till family was able to get help next morning. Per reports, has had increased in falls with worsening of tremors and weakness --has been spending most of the day in bed for past 2 weeks. She was found to be hypothermic with temp 93 degrees, was confused with strong odor of urine. She was warmed by bear hugger, started on IVF for AKI with evidence of rhabdomyolysis as well as antibiotics empirically due to concerns of sepsis. Urine/CXR negative for infectious work up and IV antibiotics transitioned to Augmentin due to concerns of aspiration PNA. 1/4 BC positive for gram negative rods and felt to be contaminant.    On CIWA protocol due to concerns of alcohol abuse ---home dose librium increased to 10 mg tid.  She has had issues with A fib with RVR treated with Cardizem drip. Rectal pouch placed due to diarrhea.  Fluid overload treated with dose of lasix today.  Therapy ongoing and patient noted to be debilitated with difficulty standing or performing ADLs as well as problems processing with STM deficits. CIR recommended due to functional deficits.      Review of Systems  Constitutional: Negative for chills and fever.  HENT: Negative for hearing loss.   Eyes: Negative for blurred vision and double vision.  Respiratory: Negative for cough and shortness of breath.   Cardiovascular: Positive for leg swelling. Negative for chest pain and palpitations.  Gastrointestinal: Positive for diarrhea. Negative for heartburn.       Incontinent of bowel due to IBS  Genitourinary: Positive for dysuria.  Musculoskeletal: Positive for back pain  (chronic) and myalgias.  Skin: Negative for rash.  Neurological: Positive for weakness. Negative for dizziness and headaches.  Psychiatric/Behavioral: Positive for depression. The patient is nervous/anxious.             Past Medical History:  Diagnosis Date  . Alopecia    . Anemia      hx of years ago   . Anxiety    . ARF (acute renal failure) ([place]) 08/23/2014  . Arthritis      psoriatic arthritis  . Back pain    . Blood transfusion      at age of 4  . Carotid stenosis 12/2016  . Chronic diastolic CHF (congestive heart failure) ([place])    . Depression    . Dysrhythmia 11/13/2016    PAFib for a short time- Echo done 11/14/16 PAF- to follow up with PCP- to see if she is a candiate for anticoags.  Not started at times time due to alcohol abuse.  [name] GERD (gastroesophageal reflux disease)    . H/O hiatal hernia    . History of acute cholangitis    . History of GI diverticular bleed    . Hyperlipidemia    . Hypertension    . Hypothyroidism    . IBS (irritable bowel syndrome)    . Pancreatitis    . Peripheral vascular disease ([place])    . PONV (postoperative nausea and vomiting)      with Breast reduction- only time  . Rosacea    .  Seizures ([place]) 11/13/2016    Thopught to be from withdrawl Benzodiazepine  . Stroke [facility])      patient denies-  . Thyroid disease             Past Surgical History:  Procedure Laterality Date  . ABDOMINAL HYSTERECTOMY        partial  . BACK SURGERY   2010    thoractic-screws and rods  . BILE DUCT STENT PLACEMENT   08/26/2014  . BREAST REDUCTION SURGERY      . CHOLECYSTECTOMY   1988    lap  . COLONOSCOPY      . COLONOSCOPY Left 09/08/2014    Procedure: COLONOSCOPY;  Surgeon: Arta Silence, MD;  Location: [facility] ENDOSCOPY;  Service: Endoscopy;  Laterality: Left;  . ENDARTERECTOMY Left 12/28/2016  . ENDARTERECTOMY Left 12/28/2016    Procedure: ENDARTERECTOMY CAROTID LEFT WITH XENOSURE BIOLOGIC PATCH ANGIOPLASTY;  Surgeon: Angelia Mould, MD;   Location: [place];  Service: Vascular;  Laterality: Left;  . ERCP      . ERCP N/A 08/26/2014    Procedure: ENDOSCOPIC RETROGRADE CHOLANGIOPANCREATOGRAPHY (ERCP);  Surgeon: Jeryl Columbia, MD;  Location: [facility] ENDOSCOPY;  Service: Endoscopy;  Laterality: N/A;  . ERCP N/A 09/11/2014    Procedure: ENDOSCOPIC RETROGRADE CHOLANGIOPANCREATOGRAPHY (ERCP);  Surgeon: Arta Silence, MD;  Location: [facility] ENDOSCOPY;  Service: Endoscopy;  Laterality: N/A;  . EUS N/A 09/11/2014    Procedure: ESOPHAGEAL ENDOSCOPIC ULTRASOUND (EUS) RADIAL;  Surgeon: Arta Silence, MD;  Location: [facility] ENDOSCOPY;  Service: Endoscopy;  Laterality: N/A;  . EYE SURGERY Bilateral      Cataract  . JOINT REPLACEMENT   2005    left shoulder replacement   . ORIF CLAVICULAR FRACTURE Left 07/18/2014    Procedure: OPEN REDUCTION INTERNAL FIXATION (ORIF) LEFT CLAVICULAR FRACTURE;  Surgeon: Ninetta Lights, MD;  Location: [place];  Service: Orthopedics;  Laterality: Left;  . SPYGLASS CHOLANGIOSCOPY N/A 09/11/2014    Procedure: SPYGLASS CHOLANGIOSCOPY;  Surgeon: Arta Silence, MD;  Location: [facility] ENDOSCOPY;  Service: Endoscopy;  Laterality: N/A;  . TONSILLECTOMY   as child  . TOTAL KNEE ARTHROPLASTY Left 03/10/2016    Procedure: LEFT TOTAL KNEE ARTHROPLASTY;  Surgeon: Ninetta Lights, MD;  Location: [place];  Service: Orthopedics;  Laterality: Left;  [name] VENTRAL HERNIA REPAIR   06/08/2012    Procedure: LAPAROSCOPIC VENTRAL HERNIA;  Surgeon: Adin Hector, MD;  Location: [facility] ORS;  Service: General;  Laterality: Right;           Family History  Problem Relation Age of Onset  . Heart disease Mother    . Cancer Father          lung  . Bipolar disorder Brother        Social History: Lives with daughter. Retired Network engineer. Was independent PTA--used walker occasionally. She  reports that she has never smoked. She has never used smokeless tobacco. She reports current alcohol use-- 2 bottles of wine daily since husband passed away.   She reports  that she does not use drugs.          Allergies  Allergen Reactions  . Tetanus Toxoids Swelling and Other (See Comments)      Arm (site) became swollen as a child  . Snake Antivenin [Antivenin Crotalidae Polyvalent] Other (See Comments)      Just can't take per daughter  . Yellow Dyes (Non-Tartrazine) Itching and Other (See Comments)      Makes patient nervous  Medications Prior to Admission  Medication Sig Dispense Refill  . acetaminophen (TYLENOL) 325 MG tablet Take 2 tablets (650 mg total) by mouth every 6 (six) hours as needed for mild pain (or Fever >/= 101).      [name] aspirin EC 81 MG tablet Take 81 mg by mouth daily.      [name] atenolol (TENORMIN) 50 MG tablet Take 1 tablet (50 mg total) by mouth daily. (Patient taking differently: Take 100 mg by mouth every morning. ) 30 tablet 0  . chlordiazePOXIDE (LIBRIUM) 5 MG capsule Take 10 mg by mouth 2 (two) times daily.       [name] FLUoxetine (PROZAC) 20 MG capsule Take 20 mg by mouth daily.      . furosemide (LASIX) 20 MG tablet Take 1 tablet (20 mg total) by mouth daily as needed. For > 3 lb weight gain in 24 hours. (Patient taking differently: Take 20 mg by mouth daily. ) 30 tablet 0  . hyoscyamine (LEVBID) 0.375 MG 12 hr tablet Take 0.375 mg by mouth 2 (two) times daily.       [name] levothyroxine (SYNTHROID, LEVOTHROID) 75 MCG tablet Take 75 mcg by mouth daily before breakfast.       . potassium chloride SA (K-DUR,KLOR-CON) 20 MEQ tablet Take 1 tablet (20 mEq total) by mouth daily as needed. Take only when taking Lasix. (Patient taking differently: Take 40 mEq by mouth daily. )      . QUEtiapine (SEROQUEL) 25 MG tablet Take 1 tablet (25 mg total) by mouth at bedtime. 30 tablet 0  . simvastatin (ZOCOR) 10 MG tablet Take 10 mg by mouth every morning.       . venlafaxine XR (EFFEXOR-XR) 37.5 MG 24 hr capsule Take 37.5 mg by mouth daily with breakfast.      . zolpidem (AMBIEN) 10 MG tablet Take 10 mg by mouth at bedtime.          Drug  Regimen Review  Drug regimen was reviewed and remains appropriate with no significant issues identified   Home: Home Living Family/patient expects to be discharged to:: Private residence Living Arrangements: Children(daughter lives with patietn since December 2019 right before) Available Help at Discharge: Family, Available 24 hours/day(daughter unemployed; has [place] apartment but living with ) Type of Home: House Home Access: Stairs to enter Technical brewer of Steps: 5 Home Layout: Full bath on main level Bathroom Shower/Tub: Chiropodist: Standard Bathroom Accessibility: Yes Home Equipment: Environmental consultant - 2 wheels, [place] - single point Additional Comments: patietn sleeps on couch on main level; was doing this over past few months  Lives With: Daughter(daughter has moved in with patient)   Functional History: Prior Function Level of Independence: Needs assistance Gait / Transfers Assistance Needed: ambulates with RW ADL's / Homemaking Assistance Needed: asissted by daughter over past 4 months   Functional Status:  Mobility: Bed Mobility Overal bed mobility: Needs Assistance Bed Mobility: Sit to Supine Sidelying to sit: Mod assist, +2 for physical assistance Supine to sit: Max assist, +2 for physical assistance Sit to supine: Min assist General bed mobility comments: Assist to bring LEs back into bed and to control descent of trunk into bed.  Transfers Overall transfer level: Needs assistance Equipment used: Rolling walker (2 wheeled) Transfer via Lift Equipment: Stedy Transfers: Sit to/from Stand, Risk manager Sit to Stand: +2 physical assistance, Min assist, Mod assist Stand pivot transfers: +2 physical assistance, Min assist General transfer comment: +2 min assist to perform sit<>stand x  2 and mod assist with final/third sit<>stand.  Performs pivot from recliner to bed with +2 min assist for safety.  Ambulation/Gait General Gait Details:  unable   ADL: ADL Overall ADL's : Needs assistance/impaired Eating/Feeding: Minimal assistance, Sitting(hands with increased swelling and arthritis) Grooming: Wash/dry hands, Wash/dry face, Oral care, Moderate assistance, Sitting(hands with increased swelling and arthritis) Upper Body Bathing: Maximal assistance, Sitting Lower Body Bathing: Maximal assistance, Total assistance, Sitting/lateral leans Upper Body Dressing : Moderate assistance, Sitting Lower Body Dressing: Maximal assistance, Total assistance, Sitting/lateral leans Toilet Transfer: +2 for safety/equipment, Moderate assistance, Minimal assistance Toilet Transfer Details (indicate cue type and reason): Simulated with stand pivot transfer from recliner to bed.  Toileting- Clothing Manipulation and Hygiene: +2 for safety/equipment, Total assistance, Sit to/from stand Toileting - Clothing Manipulation Details (indicate cue type and reason): One person assist with balance while second person assist with back peri care due to loose stool. Functional mobility during ADLs: +2 for safety/equipment, Minimal assistance, Rolling walker, Cueing for sequencing, Cueing for safety General ADL Comments: Pt up in recliner upon OT arrival.  Pt requesting to get up to relieve pressure on her bottom.  In standing, she requires total assist to complete back peri care (+2 assist for balance and for peri care).    Cognition: Cognition Overall Cognitive Status: Impaired/Different from baseline Orientation Level: Oriented X4 Cognition Arousal/Alertness: Awake/alert Behavior During Therapy: WFL for tasks assessed/performed Overall Cognitive Status: Impaired/Different from baseline Area of Impairment: Attention, Following commands, Safety/judgement Current Attention Level: Selective Memory: Decreased short-term memory Following Commands: Follows one step commands with increased time Safety/Judgement: Decreased awareness of safety, Decreased awareness  of deficits Awareness: Emergent Problem Solving: Slow processing, Requires verbal cues General Comments: Pt not requiring as many cues this session to perform tasks. Was very interactive throughout.      Blood pressure (!) 151/64, pulse 77, temperature 97.6 F (36.4 C), temperature source Oral, resp. rate 20, height 5' (1.524 m), weight 81.6 kg, SpO2 94 %. Physical Exam  Nursing note and vitals reviewed. Constitutional: She is oriented to person, place, and time. She appears well-developed and well-nourished.  HENT:  Bruising about face/eyes  Eyes: Pupils are equal, round, and reactive to light. Conjunctivae are normal.  Periorbital ecchymosis   Neck: Normal range of motion. No thyromegaly present.  Cardiovascular: Normal rate and regular rhythm.  Respiratory: Effort normal. No respiratory distress. She has no wheezes.  GI: Soft. She exhibits no distension. There is no abdominal tenderness.  Musculoskeletal:        General: Edema (bilateral ankles/feet) present.     Comments: Diffuse ecchymosis with dependent edema RUE. Dry flaky skin BLE. Right hand and wrist tender with ulnar deviation of fingers.   Neurological: She is alert and oriented to person, place, and time.  Mild dysarthria. Able to answer basic biographic questions but tangential with lack of time frame tends to refers most problems back to the loss of her husband. Intact language, functional memory. RUE 3/5. LUE 4/5. Bilateral LE 2+/5 HF, 3/5 KE and 3+/5 ADF/PF. Senses pain in all 4's.   Skin: Skin is warm and dry.  Left TKA scar.   Psychiatric:  A little anxious      Lab Results Last 48 Hours        Results for orders placed or performed during the hospital encounter of 03/09/19 (from the past 48 hour(s))  CBC     Status: Abnormal    Collection Time: 03/12/19  5:16 AM  Result Value Ref  Range    WBC 11.8 (H) 4.0 - 10.5 K/uL    RBC 3.70 (L) 3.87 - 5.11 MIL/uL    Hemoglobin 11.3 (L) 12.0 - 15.0 g/dL    HCT 35.7 (L)  36.0 - 46.0 %    MCV 96.5 80.0 - 100.0 fL    MCH 30.5 26.0 - 34.0 pg    MCHC 31.7 30.0 - 36.0 g/dL    RDW 13.4 11.5 - 15.5 %    Platelets 101 (L) 150 - 400 K/uL      Comment: REPEATED TO VERIFY Immature Platelet Fraction may be clinically indicated, consider ordering this additional test [id number] CONSISTENT WITH PREVIOUS RESULT      nRBC 0.0 0.0 - 0.2 %      Comment: Performed at [facility], [place] [address] [place], [place] [address]  Basic metabolic panel     Status: Abnormal    Collection Time: 03/12/19  5:16 AM  Result Value Ref Range    Sodium 141 135 - 145 mmol/L    Potassium 3.5 3.5 - 5.1 mmol/L    Chloride 113 (H) 98 - 111 mmol/L    CO2 18 (L) 22 - 32 mmol/L    Glucose, Bld 113 (H) 70 - 99 mg/dL    BUN 22 8 - 23 mg/dL    Creatinine, Ser 1.18 (H) 0.44 - 1.00 mg/dL    Calcium 8.7 (L) 8.9 - 10.3 mg/dL    GFR calc non Af Amer 44 (L) >60 mL/min    GFR calc Af Amer 50 (L) >60 mL/min    Anion gap 10 5 - 15      Comment: Performed at [facility] [address], [place], [place] [address]  Urinalysis, Routine w reflex microscopic     Status: Abnormal    Collection Time: 03/12/19  6:08 AM  Result Value Ref Range    Color, Urine YELLOW YELLOW    APPearance HAZY (A) CLEAR    Specific Gravity, Urine 1.015 1.005 - 1.030    pH 5.0 5.0 - 8.0    Glucose, UA NEGATIVE NEGATIVE mg/dL    Hgb urine dipstick SMALL (A) NEGATIVE    Bilirubin Urine NEGATIVE NEGATIVE    Ketones, ur NEGATIVE NEGATIVE mg/dL    Protein, ur 30 (A) NEGATIVE mg/dL    Nitrite NEGATIVE NEGATIVE    Leukocytes,Ua MODERATE (A) NEGATIVE    RBC / HPF 11-20 0 - 5 RBC/hpf    WBC, UA >50 (H) 0 - 5 WBC/hpf    Bacteria, UA RARE (A) NONE SEEN    Squamous Epithelial / LPF 0-5 0 - 5      Comment: Performed at [facility], [place] [address], [place], [place] [address]  CBC     Status: Abnormal    Collection Time: 03/13/19  5:42 AM  Result Value Ref Range    WBC 8.1 4.0 - 10.5 K/uL    RBC 3.24 (L) 3.87  - 5.11 MIL/uL    Hemoglobin 10.1 (L) 12.0 - 15.0 g/dL    HCT 32.0 (L) 36.0 - 46.0 %    MCV 98.8 80.0 - 100.0 fL    MCH 31.2 26.0 - 34.0 pg    MCHC 31.6 30.0 - 36.0 g/dL    RDW 13.4 11.5 - 15.5 %    Platelets 105 (L) 150 - 400 K/uL      Comment: REPEATED TO VERIFY Immature Platelet Fraction may be clinically indicated, consider ordering this additional test  [id number] CONSISTENT WITH PREVIOUS RESULT      nRBC 0.2 0.0 - 0.2 %      Comment: Performed at [facility], [place] [address] [place], [place] [address]  Basic metabolic panel     Status: Abnormal    Collection Time: 03/13/19  5:42 AM  Result Value Ref Range    Sodium 142 135 - 145 mmol/L    Potassium 3.4 (L) 3.5 - 5.1 mmol/L    Chloride 114 (H) 98 - 111 mmol/L    CO2 18 (L) 22 - 32 mmol/L    Glucose, Bld 108 (H) 70 - 99 mg/dL    BUN 20 8 - 23 mg/dL    Creatinine, Ser 1.33 (H) 0.44 - 1.00 mg/dL    Calcium 8.4 (L) 8.9 - 10.3 mg/dL    GFR calc non Af Amer 38 (L) >60 mL/min    GFR calc Af Amer 44 (L) >60 mL/min    Anion gap 10 5 - 15      Comment: Performed at [facility] [address] [place], [place] [address]      Imaging Results (Last 48 hours)  No results found.           Medical Problem List and Plan: 1.  Functional mobility deficits secondary to rhabdomyolysis, pneumonia, ETOH abuse.             -admit to inpatient rehab 2.  Antithrombotics: -DVT/anticoagulation:  Pharmaceutical: Lovenox             -antiplatelet therapy: ASA 3. Pain Management: tylenol prn.  4. Mood: team to provide ego support. LCSW/Pschology to follow up for support.              -antipsychotic agents: On Seroquel and librium (used 10 mg bid for anxiety).   5. Neuropsych: This patient is not capable of making decisions on her own behalf. 6. Skin/Wound Care: Routine pressure relief measures.  7. Fluids/Electrolytes/Nutrition: Monitor I/O. Check lytes in am. Offer supplements during the day due to low calorie malnutrition.  8.  Aspiration PNA?: Day 3 antibiotics--transition to Augment for a week of antibiotic course. 9. AKI with mild rhabdomyolysis: Recheck labs in am. 10. Chronic diastolic CHF: Order daily weights and monitor for signs of overload.   11. H/o anxiety/depression: Lost husband 12/19. Managed by Dr. Francis Dowse Effexor, Prozac and librium.  12. Abnormal LFTs:  Resolving. Recheck in am.  13. Fall risk: Continue high low bed.  14. Diarrhea: Continue rectal tube as is incontinent of bowel and has irritated       Post Admission Physician Evaluation: 1. Functional deficits secondary  to rhabdo, deconditioning. 2. Patient is admitted to receive collaborative, interdisciplinary care between the physiatrist, rehab nursing staff, and therapy team. 3. Patient's level of medical complexity and substantial therapy needs in context of that medical necessity cannot be provided at a lesser intensity of care such as a SNF. 4. Patient has experienced substantial functional loss from his/her baseline which was documented above under the "Functional History" and "Functional Status" headings.  Judging by the patient's diagnosis, physical exam, and functional history, the patient has potential for functional progress which will result in measurable gains while on inpatient rehab.  These gains will be of substantial and practical use upon discharge  in facilitating mobility and self-care at the household level. 5. Physiatrist will provide 24 hour management of medical needs as well as oversight of the therapy plan/treatment and provide guidance as appropriate regarding  the interaction of the two. 6. The Preadmission Screening has been reviewed and patient status is unchanged unless otherwise stated above. 7. 24 hour rehab nursing will assist with bladder management, bowel management, safety, skin/wound care, disease management, medication administration, pain management and patient education  and help integrate therapy concepts,  techniques,education, etc. 8. PT will assess and treat for/with: Lower extremity strength, range of motion, stamina, balance, functional mobility, safety, adaptive techniques and equipment, NMR, pain control, family education.   Goals are: supervision to min assist. 9. OT will assess and treat for/with: ADL's, functional mobility, safety, upper extremity strength, adaptive techniques and equipment, NMR, pain control, family ed.   Goals are: min assist. Therapy may proceed with showering this patient. 10. SLP will assess and treat for/with: cognition, communication.  Goals are: supervision . 11. Case Management and Social Worker will assess and treat for psychological issues and discharge planning. 12. Team conference will be held weekly to assess progress toward goals and to determine barriers to discharge. 13. Patient will receive at least 3 hours of therapy per day at least 5 days per week. 14. ELOS: 14-20 days       15. Prognosis:  excellent   I have personally performed a face to face diagnostic evaluation of this patient and formulated the key components of the plan.  Additionally, I have personally reviewed laboratory data, imaging studies, as well as relevant notes and concur with the physician assistant's documentation above.  Meredith Staggers, MD, [facility] Drown     Bary Leriche, PA-C 03/13/2019

## 2019-03-14 ENCOUNTER — Inpatient Hospital Stay (HOSPITAL_COMMUNITY): Payer: Self-pay | Admitting: Physical Therapy

## 2019-03-14 ENCOUNTER — Inpatient Hospital Stay (HOSPITAL_COMMUNITY): Payer: Self-pay | Admitting: Occupational Therapy

## 2019-03-14 ENCOUNTER — Inpatient Hospital Stay (HOSPITAL_COMMUNITY): Payer: Self-pay | Admitting: Speech Pathology

## 2019-03-14 LAB — COMPREHENSIVE METABOLIC PANEL
ALBUMIN: 2.4 g/dL — AB (ref 3.5–5.0)
ALT: 25 U/L (ref 0–44)
AST: 18 U/L (ref 15–41)
Alkaline Phosphatase: 97 U/L (ref 38–126)
Anion gap: 11 (ref 5–15)
BUN: 23 mg/dL (ref 8–23)
CO2: 18 mmol/L — ABNORMAL LOW (ref 22–32)
Calcium: 8.3 mg/dL — ABNORMAL LOW (ref 8.9–10.3)
Chloride: 111 mmol/L (ref 98–111)
Creatinine, Ser: 1.36 mg/dL — ABNORMAL HIGH (ref 0.44–1.00)
GFR calc Af Amer: 42 mL/min — ABNORMAL LOW (ref >60)
GFR calc non Af Amer: 37 mL/min — ABNORMAL LOW (ref >60)
GLUCOSE: 113 mg/dL — AB (ref 70–99)
Potassium: 3.6 mmol/L (ref 3.5–5.1)
Sodium: 140 mmol/L (ref 135–145)
Total Bilirubin: 0.8 mg/dL (ref 0.3–1.2)
Total Protein: 5.7 g/dL — ABNORMAL LOW (ref 6.5–8.1)

## 2019-03-14 LAB — CULTURE, BLOOD (ROUTINE X 2)
CULTURE: NO GROWTH
Special Requests: ADEQUATE

## 2019-03-14 LAB — CBC WITH DIFFERENTIAL/PLATELET
Abs Immature Granulocytes: 0.07 10*3/uL (ref 0.00–0.07)
BASOS ABS: 0 10*3/uL (ref 0.0–0.1)
Basophils Relative: 0 %
Eosinophils Absolute: 0.2 10*3/uL (ref 0.0–0.5)
Eosinophils Relative: 2 %
HEMATOCRIT: 34.8 % — AB (ref 36.0–46.0)
Hemoglobin: 10.6 g/dL — ABNORMAL LOW (ref 12.0–15.0)
Immature Granulocytes: 1 %
Lymphocytes Relative: 11 %
Lymphs Abs: 0.8 10*3/uL (ref 0.7–4.0)
MCH: 30.1 pg (ref 26.0–34.0)
MCHC: 30.5 g/dL (ref 30.0–36.0)
MCV: 98.9 fL (ref 80.0–100.0)
Monocytes Absolute: 0.9 10*3/uL (ref 0.1–1.0)
Monocytes Relative: 12 %
NEUTROS ABS: 5.4 10*3/uL (ref 1.7–7.7)
Neutrophils Relative %: 74 %
Platelets: 115 10*3/uL — ABNORMAL LOW (ref 150–400)
RBC: 3.52 MIL/uL — ABNORMAL LOW (ref 3.87–5.11)
RDW: 13.5 % (ref 11.5–15.5)
WBC: 7.3 10*3/uL (ref 4.0–10.5)
nRBC: 0 % (ref 0.0–0.2)

## 2019-03-14 MED ORDER — SACCHAROMYCES BOULARDII 250 MG PO CAPS
250.0000 mg | ORAL_CAPSULE | Freq: Two times a day (BID) | ORAL | Status: DC
  Administered 2019-03-14 – 2019-04-03 (×41): 250 mg via ORAL
  Filled 2019-03-14 (×41): qty 1

## 2019-03-14 NOTE — Evaluation (Signed)
Physical Therapy Assessment and Plan  Patient Details  Name: [name] [name] MRN: [id number] Date of Birth: Dec 18, 1935  PT Diagnosis: Abnormality of gait, Cognitive deficits, Difficulty walking, Impaired cognition and Muscle weakness Rehab Potential: Good ELOS: 14-16 days   Today's Date: 03/14/2019 PT Individual Time: 1000-1115 PT Individual Time Calculation (min): 75 min    Problem List:  Patient Active Problem List   Diagnosis Date Noted  . Debility 03/13/2019  . Hypothermia 03/09/2019  . Elevated liver enzymes 03/09/2019  . Thrombocytopenia ([place]) 03/09/2019  . Hyperkalemia 07/23/2017  . AKI (acute kidney injury) ([place]) 07/23/2017  . 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 Fall 06/26/2017  . Intractable nausea and vomiting 03/11/2017  . Acute lower UTI 03/11/2017  . Carotid stenosis 12/28/2016  . Sedative, hypnotic, or anxiolytic withdrawal delirium 11/15/2016  . Paroxysmal atrial fibrillation (H[facility])   . Cerebrovascular disease   . Fever   . Acute encephalopathy 11/10/2016  . Hypothyroidism 11/10/2016  . Hypertensive urgency 11/10/2016  . Altered mental state 11/10/2016  . Seizure ([place]) 11/10/2016  . Chronic diastolic CHF (congestive heart failure) ([place])   . Left carotid stenosis 10/29/2016  . S/P total knee replacement 03/10/2016  . 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 Ileus ([place]) 09/05/2014  . Leukocytosis 09/05/2014  . Vomiting 09/04/2014  . Pulmonary edema 09/04/2014  . Hypokalemia 08/28/2014  . Enteritis due to Clostridium difficile 08/27/2014  . 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 Protein calorie malnutrition ([place]) 08/24/2014  . Obstructive jaundice 08/23/2014  . Acute cholangitis 08/23/2014  . ARF (acute renal failure) ([place]) 08/23/2014  . Coagulopathy ([place]) 08/23/2014  . Back pain   . Arthritis   . Hypertension   . GERD (gastroesophageal reflux disease)   . Anxiety   . IBS (irritable bowel syndrome)   .  Hernia of Right posterior flank 04/18/2012    Past Medical History:  Past Medical History:  Diagnosis Date  . Alopecia   . Anemia    hx of years ago   . Anxiety   . ARF (acute renal failure) ([place]) 08/23/2014  . Arthritis    psoriatic arthritis  . Back pain   . Blood transfusion    at age of 4  . Carotid stenosis 12/2016  . Chronic diastolic CHF (congestive heart failure) ([place])   . Depression   . Dysrhythmia 11/13/2016   PAFib for a short time- Echo done 11/14/16 PAF- to follow up with PCP- to see if she is a candiate for anticoags.  Not started at times time due to alcohol abuse.  [name] GERD (gastroesophageal reflux disease)   . H/O hiatal hernia   . History of acute cholangitis   . History of GI diverticular bleed   . Hyperlipidemia   . Hypertension   . Hypothyroidism   . IBS (irritable bowel syndrome)   . Pancreatitis   . Peripheral vascular disease ([place])   . PONV (postoperative nausea and vomiting)    with Breast reduction- only time  . Rosacea   . Seizures ([place]) 11/13/2016   Thopught to be from withdrawl Benzodiazepine  . Stroke [facility])    patient denies-  . Thyroid disease    Past Surgical History:  Past Surgical History:  Procedure Laterality Date  . ABDOMINAL HYSTERECTOMY     partial  . BACK SURGERY  2010   thoractic-screws and rods  . BILE DUCT STENT PLACEMENT  08/26/2014  . BREAST REDUCTION SURGERY    .  CHOLECYSTECTOMY  1988   lap  . COLONOSCOPY    . COLONOSCOPY Left 09/08/2014   Procedure: COLONOSCOPY;  Surgeon: Arta Silence, MD;  Location: [facility] ENDOSCOPY;  Service: Endoscopy;  Laterality: Left;  . ENDARTERECTOMY Left 12/28/2016  . ENDARTERECTOMY Left 12/28/2016   Procedure: ENDARTERECTOMY CAROTID LEFT WITH XENOSURE BIOLOGIC PATCH ANGIOPLASTY;  Surgeon: Angelia Mould, MD;  Location: [place];  Service: Vascular;  Laterality: Left;  . ERCP    . ERCP N/A 08/26/2014   Procedure: ENDOSCOPIC RETROGRADE CHOLANGIOPANCREATOGRAPHY (ERCP);  Surgeon: Jeryl Columbia,  MD;  Location: [facility] ENDOSCOPY;  Service: Endoscopy;  Laterality: N/A;  . ERCP N/A 09/11/2014   Procedure: ENDOSCOPIC RETROGRADE CHOLANGIOPANCREATOGRAPHY (ERCP);  Surgeon: Arta Silence, MD;  Location: [facility] ENDOSCOPY;  Service: Endoscopy;  Laterality: N/A;  . EUS N/A 09/11/2014   Procedure: ESOPHAGEAL ENDOSCOPIC ULTRASOUND (EUS) RADIAL;  Surgeon: Arta Silence, MD;  Location: [facility] ENDOSCOPY;  Service: Endoscopy;  Laterality: N/A;  . EYE SURGERY Bilateral    Cataract  . JOINT REPLACEMENT  2005   left shoulder replacement   . ORIF CLAVICULAR FRACTURE Left 07/18/2014   Procedure: OPEN REDUCTION INTERNAL FIXATION (ORIF) LEFT CLAVICULAR FRACTURE;  Surgeon: Ninetta Lights, MD;  Location: [place];  Service: Orthopedics;  Laterality: Left;  . SPYGLASS CHOLANGIOSCOPY N/A 09/11/2014   Procedure: SPYGLASS CHOLANGIOSCOPY;  Surgeon: Arta Silence, MD;  Location: [facility] ENDOSCOPY;  Service: Endoscopy;  Laterality: N/A;  . TONSILLECTOMY  as child  . TOTAL KNEE ARTHROPLASTY Left 03/10/2016   Procedure: LEFT TOTAL KNEE ARTHROPLASTY;  Surgeon: Ninetta Lights, MD;  Location: [place];  Service: Orthopedics;  Laterality: Left;  [name] VENTRAL HERNIA REPAIR  06/08/2012   Procedure: LAPAROSCOPIC VENTRAL HERNIA;  Surgeon: Adin Hector, MD;  Location: [facility] ORS;  Service: General;  Laterality: Right;    Assessment & Plan Clinical Impression:  [name] [name] is an 83 year old female with history of HTN, PAF--no AC due to falls, psoriatic arthritis, anxiety/depression, chronic diastolic CHF, who was admitted on 03/09/19 after falling in the middle of the night and laid on the tile floor till family was able to get help next morning. Per reports, has had increased in falls with worsening of tremors and weakness --has been spending most of the day in bed for past 2 weeks. She was found to be hypothermic with temp 93 degrees, was confused with strong odor of urine. She was warmed by bear hugger, started on IVF for AKI with  evidence of rhabdomyolysis as well as antibiotics empirically due to concerns of sepsis. Urine/CXR negative for infectious work up and IV antibiotics transitioned to Augmentin due to concerns of aspiration PNA. 1/4 BC positive for gram negative rods and felt to be contaminant.   On CIWA protocol due to concerns of alcohol abuse---home dose librium increased to 10 mg tid. She has had issues with A fib with RVR treated with Cardizem drip. Rectal pouch placed due to diarrhea.Fluid overload treated with dose of lasix today.Therapy ongoing and patient noted to be debilitated with difficulty standing or performing ADLs as well as problems processing with STM deficits. CIR recommended due to functional deficits. Patient transferred to CIR on 03/13/2019 .   Patient currently requires min to  mod with mobility secondary to muscle weakness, decreased cardiorespiratoy endurance, decreased awareness, decreased problem solving, decreased safety awareness and decreased memory and decreased standing balance, decreased postural control and decreased balance strategies.  Prior to hospitalization, patient was modified independent  with mobility and lived  with Daughter in a House home.  Home access is 1Stairs to enter.  Patient will benefit from skilled PT intervention to maximize safe functional mobility, minimize fall risk and decrease caregiver burden for planned discharge home with 24 hour assist.  Anticipate patient will benefit from follow up [facility] at discharge.  PT - End of Session Activity Tolerance: Tolerates 30+ min activity with multiple rests Endurance Deficit: Yes Endurance Deficit Description: fatigues quickly with standing activity PT Assessment Rehab Potential (ACUTE/IP ONLY): Good PT Barriers to Discharge: Medical stability;Home environment access/layout PT Patient demonstrates impairments in the following area(s): Balance;Endurance;Safety PT Transfers Functional Problem(s): Bed Mobility;Bed to  Chair;Car;Furniture;Floor PT Locomotion Functional Problem(s): Ambulation;Wheelchair Mobility;Stairs PT Plan PT Intensity: Minimum of 1-2 x/day ,45 to 90 minutes PT Frequency: 5 out of 7 days PT Duration Estimated Length of Stay: 14-16 days PT Treatment/Interventions: Ambulation/gait training;Balance/vestibular training;Community reintegration;Discharge planning;Disease management/prevention;DME/adaptive equipment instruction;Functional mobility training;Patient/family education;Psychosocial support;Therapeutic Activities;Therapeutic Exercise;UE/LE Strength taining/ROM;UE/LE Coordination activities;Wheelchair propulsion/positioning PT Transfers Anticipated Outcome(s): Supervision PT Locomotion Anticipated Outcome(s): Supervision with LRAD PT Recommendation Recommendations for Other Services: Neuropsych consult;Therapeutic Recreation consult Therapeutic Recreation Interventions: Stress management Follow Up Recommendations: Home health PT Patient destination: Home Equipment Recommended: Rolling walker with 5" wheels;To be determined Equipment Details: pt owns RW, other equipment TBD pending progress  Skilled Therapeutic Intervention Evaluation completed (see details above and below) with education on PT POC and goals and individual treatment initiated with focus on functional transfer and gait assessment. Pt received seated in bed requesting to use the bathroom. No complaints of pain. Supine to sit with min A. Stand pivot transfer bed to w/c with min to mod A. Toilet transfer w/c to/from toilet with mod A and use of grab bar. Pt is setup A for pericare. Sit to stand with min A to RW. Ambulation 2 x 10 ft with RW and min A before onset of fatigue. Oriented patient to rehab unit with education about LOS, goals, etc. Pt appears very pleasant and motivated throughout session. Pt left seated in w/c in room with needs in reach, quick release belt and chair alarm in place.   PT  Evaluation Precautions/Restrictions Precautions Precautions: Fall Restrictions Weight Bearing Restrictions: No Home Living/Prior Functioning Home Living Available Help at Discharge: Family;Available 24 hours/day Type of Home: House Home Access: Stairs to enter [employer] of Steps: 1 Entrance Stairs-Rails: None Home Layout: Multi-level;Able to live on main level with bedroom/bathroom;Other (Comment)(full bath 1st level, sleeps on couch)  Lives With: Daughter Prior Function Level of Independence: Needs assistance with gait;Needs assistance with tranfers  Able to Take Stairs?: Yes Driving: No Vocation: Retired Radiographer, therapeutic - History Baseline Vision: Wears glasses only for reading Perception Perception: Within Functional Limits Praxis Praxis: Intact  Cognition Overall Cognitive Status: Impaired/Different from baseline Arousal/Alertness: Awake/alert Orientation Level: Oriented X4 Attention: Sustained Sustained Attention: Appears intact Memory: Impaired Memory Impairment: Storage deficit;Retrieval deficit;Decreased recall of new information Awareness: Impaired Awareness Impairment: Anticipatory impairment Problem Solving: Impaired Problem Solving Impairment: Functional complex Executive Function: Self Monitoring;Self Correcting Self Monitoring: Impaired Self Monitoring Impairment: Functional complex Self Correcting: Impaired Self Correcting Impairment: Functional complex Behaviors: Lability Safety/Judgment: Impaired Sensation Sensation Light Touch: Appears Intact Proprioception: Appears Intact Coordination Gross Motor Movements are Fluid and Coordinated: No Fine Motor Movements are Fluid and Coordinated: No Coordination and Movement Description: impaired by generalized weakness Motor  Motor Motor: Within Functional Limits  Mobility Bed Mobility Bed Mobility: Rolling Right;Rolling Left;Supine to Sit;Sit to Supine Rolling Right: Minimal  Assistance - Patient > 75% Rolling  Left: Minimal Assistance - Patient > 75% Supine to Sit: Moderate Assistance - Patient 50-74% Sit to Supine: Moderate Assistance - Patient 50-74% Transfers Transfers: Stand to Sit;Stand Pivot Transfers Stand to Sit: Moderate Assistance - Patient 50-74% Stand Pivot Transfers: Minimal Assistance - Patient > 75% Stand Pivot Transfer Details: Verbal cues for technique;Verbal cues for precautions/safety;Verbal cues for safe use of DME/AE;Tactile cues for placement Transfer (Assistive device): Rolling walker Locomotion  Gait Gait Distance (Feet): 10 Feet Assistive device: Rolling walker Gait Gait Pattern: Impaired Gait velocity: decreased Stairs / Additional Locomotion Stairs: No Wheelchair Mobility Wheelchair Mobility: No  Trunk/Postural Assessment  Cervical Assessment Cervical Assessment: Exceptions to WFL(forward head) Thoracic Assessment Thoracic Assessment: Within Functional Limits Lumbar Assessment Lumbar Assessment: Exceptions to WFL(posterior pelvic tilt) Postural Control Postural Control: Deficits on evaluation Righting Reactions: delayed  Balance Balance Balance Assessed: Yes Static Sitting Balance Static Sitting - Balance Support: No upper extremity supported;Feet supported Static Sitting - Level of Assistance: 5: Stand by assistance Dynamic Sitting Balance Dynamic Sitting - Balance Support: No upper extremity supported;Feet supported Dynamic Sitting - Level of Assistance: 4: Min Insurance risk surveyor Standing - Balance Support: Bilateral upper extremity supported;During functional activity Static Standing - Level of Assistance: 4: Min assist Dynamic Standing Balance Dynamic Standing - Balance Support: Bilateral upper extremity supported;During functional activity Dynamic Standing - Level of Assistance: 4: Min assist Extremity Assessment   RLE Assessment RLE Assessment: Within Functional Limits General Strength  Comments: 4/5 grossly LLE Assessment LLE Assessment: Within Functional Limits General Strength Comments: 4/5 grossly    Refer to Care Plan for Long Term Goals  Recommendations for other services: Neuropsych and Therapeutic Recreation  Stress management  Discharge Criteria: Patient will be discharged from PT if patient refuses treatment 3 consecutive times without medical reason, if treatment goals not met, if there is a change in medical status, if patient makes no progress towards goals or if patient is discharged from hospital.  The above assessment, treatment plan, treatment alternatives and goals were discussed and mutually agreed upon: by patient   Excell Seltzer, PT, DPT 03/14/2019, 1:21 PM

## 2019-03-14 NOTE — Progress Notes (Signed)
Pt slept fairly well throughout the night. Given prn tylenol for bilateral hand pain with positive effects noted. Also given prn ambien for sleep with good effects noted. Pt continent of urine throughout the night, fecal pouch intact.

## 2019-03-14 NOTE — Progress Notes (Signed)
[place] PHYSICAL MEDICINE & REHABILITATION PROGRESS NOTE   Subjective/Complaints: Had a fair night. Seemed to sleep.   ROS: Patient denies fever, rash, sore throat, blurred vision, nausea, vomiting, diarrhea, cough, shortness of breath or chest pain,   headache, or mood change.    Objective:   No results found. Recent Labs    03/13/19 0542 03/14/19 0600  WBC 8.1 7.3  HGB 10.1* 10.6*  HCT 32.0* 34.8*  PLT 105* 115*   Recent Labs    03/13/19 0542 03/14/19 0600  NA 142 140  K 3.4* 3.6  CL 114* 111  CO2 18* 18*  GLUCOSE 108* 113*  BUN 20 23  CREATININE 1.33* 1.36*  CALCIUM 8.4* 8.3*    Intake/Output Summary (Last 24 hours) at 03/14/2019 0857 Last data filed at 03/14/2019 0700 Gross per 24 hour  Intake -  Output 1050 ml  Net -1050 ml     Physical Exam: Vital Signs Blood pressure (!) 162/70, pulse 80, temperature 97.9 F (36.6 C), temperature source Oral, resp. rate 18, height 5\' 1"  (1.549 m), weight 80.2 kg, SpO2 94 %. Constitutional: No distress . Vital signs reviewed. HEENT: EOMI, oral membranes moist, periorbital ecchymoses Neck: supple Cardiovascular: RRR without murmur. No JVD    Respiratory: CTA Bilaterally without wheezes or rales. Normal effort    GI: BS +, non-tender, non-distended   Musculoskeletal:  General: Edema(bilateral ankles/feet)1+.  Comments:   Right hand and wrist tender with ulnar deviation of fingers. Neurological: She is alertand oriented to person, place, and time.tangential, distracted sometimes.    Intact language, functional memory. RUE 3/5. LUE 4/5. Bilateral LE 2+/5 HF, 3/5 KE and 3+/5 ADF/PF. Senses pain in all 4's. Skin: Skin iswarmand dry. Left TKA scar.erythema RUE. Areas of bruising on arms Psychiatric:  A little anxious    Assessment/Plan: 1. Functional deficits secondary to rhabdomyolysis, debility which require 3+ hours per day of interdisciplinary therapy in a comprehensive inpatient rehab  setting.  Physiatrist is providing close team supervision and 24 hour management of active medical problems listed below.  Physiatrist and rehab team continue to assess barriers to discharge/monitor patient progress toward functional and medical goals  Care Tool:  Bathing              Bathing assist       Upper Body Dressing/Undressing Upper body dressing   What is the patient wearing?: Hospital gown only    Upper body assist Assist Level: Maximal Assistance - Patient 25 - 49%    Lower Body Dressing/Undressing Lower body dressing      What is the patient wearing?: (nothing)     Lower body assist Assist for lower body dressing: Maximal Assistance - Patient 25 - 49%     Toileting Toileting    Toileting assist Assist for toileting: Maximal Assistance - Patient 25 - 49%     Transfers Chair/bed transfer  Transfers assist  Chair/bed transfer activity did not occur: Safety/medical concerns        Locomotion Ambulation   Ambulation assist              Walk 10 feet activity   Assist           Walk 50 feet activity   Assist           Walk 150 feet activity   Assist           Walk 10 feet on uneven surface  activity   Assist  Wheelchair     Assist               Wheelchair 50 feet with 2 turns activity    Assist            Wheelchair 150 feet activity     Assist          Medical Problem List and Plan: 1.Functional mobility deficitssecondary to rhabdomyolysis, pneumonia, ETOH abuse. --Patient is beginning CIR therapies today including PT, OT, and SLP  2. Antithrombotics: -DVT/anticoagulation:Pharmaceutical:Lovenox -antiplatelet therapy: ASA 3. Pain Management:tylenol prn. 4. Mood:team to provide ego support. LCSW assessment  -will ask Pschology to see also  -no major issues at present. -antipsychotic agents: On Seroquel and  librium(used 10 mg bid for anxiety). 5. Neuropsych: This patientis notcapable of making decisions on herown behalf. 6. Skin/Wound Care:Routine pressure relief measures. 7. Fluids/Electrolytes/Nutrition:encourage PO, eating well as of late  -BUN sl elevated--push fluids 8. Aspiration PNA?:Day 3 antibiotics--transition toAugment fora week ofantibiotic course. 9. AKI with mild rhabdomyolysis: Cr sl elevated 1.36  -push fluids 10.Chronic diastolic CHF: daily weights  -stable at present [facility]   03/13/19 1800 03/14/19 0512  Weight: 80 kg 80.2 kg    11. H/o anxiety/depression: Lost husband 12/19.Managed by Dr. Vilma Meckel and librium. 12. Abnormal LFTs:Resolving. Recheck in am. 13. Fall risk: Continue high low bed.  14. Diarrhea: Continue rectal tube as is incontinent of bowel and has irritatedskin  -add probiotic  -will likely be an issue while on abxs    LOS: 1 days A FACE TO FACE EVALUATION WAS PERFORMED  Meredith Staggers 03/14/2019, 8:57 AM

## 2019-03-14 NOTE — Progress Notes (Signed)
Patient information reviewed and entered into e[facility] System by Becky Jonluke Cobbins, PPS coordinator. Information including medical coding, function ability, and quality indicators will be reviewed and updated through discharge.   

## 2019-03-14 NOTE — Plan of Care (Signed)
Due to the current state of emergency, patients may not be receiving their 3-hours of Medicare-mandated therapy.   

## 2019-03-14 NOTE — Evaluation (Addendum)
Speech Language Pathology Assessment and Plan  Patient Details  Name: BOWIE DOIRON MRN: [id number] Date of Birth: 04/04/1936  SLP Diagnosis: Cognitive Impairments  Rehab Potential: Good ELOS: 14-17 days     Today's Date: 03/14/2019 SLP Individual Time:   3976-734   Problem List:  Patient Active Problem List   Diagnosis Date Noted  . Debility 03/13/2019  . Hypothermia 03/09/2019  . Elevated liver enzymes 03/09/2019  . Thrombocytopenia ([place]) 03/09/2019  . Hyperkalemia 07/23/2017  . AKI (acute kidney injury) ([place]) 07/23/2017  . 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 Fall 06/26/2017  . Intractable nausea and vomiting 03/11/2017  . Acute lower UTI 03/11/2017  . Carotid stenosis 12/28/2016  . Sedative, hypnotic, or anxiolytic withdrawal delirium 11/15/2016  . Paroxysmal atrial fibrillation (H[facility])   . Cerebrovascular disease   . Fever   . Acute encephalopathy 11/10/2016  . Hypothyroidism 11/10/2016  . Hypertensive urgency 11/10/2016  . Altered mental state 11/10/2016  . Seizure ([place]) 11/10/2016  . Chronic diastolic CHF (congestive heart failure) ([place])   . Left carotid stenosis 10/29/2016  . S/P total knee replacement 03/10/2016  . 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 Ileus ([place]) 09/05/2014  . Leukocytosis 09/05/2014  . Vomiting 09/04/2014  . Pulmonary edema 09/04/2014  . Hypokalemia 08/28/2014  . Enteritis due to Clostridium difficile 08/27/2014  . 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 Protein calorie malnutrition ([place]) 08/24/2014  . Obstructive jaundice 08/23/2014  . Acute cholangitis 08/23/2014  . ARF (acute renal failure) ([place]) 08/23/2014  . Coagulopathy ([place]) 08/23/2014  . Back pain   . Arthritis   . Hypertension   . GERD (gastroesophageal reflux disease)   . Anxiety   . IBS (irritable bowel syndrome)   . Hernia of Right posterior flank 04/18/2012   Past Medical History:  Past Medical History:  Diagnosis  Date  . Alopecia   . Anemia    hx of years ago   . Anxiety   . ARF (acute renal failure) ([place]) 08/23/2014  . Arthritis    psoriatic arthritis  . Back pain   . Blood transfusion    at age of 4  . Carotid stenosis 12/2016  . Chronic diastolic CHF (congestive heart failure) ([place])   . Depression   . Dysrhythmia 11/13/2016   PAFib for a short time- Echo done 11/14/16 PAF- to follow up with PCP- to see if she is a candiate for anticoags.  Not started at times time due to alcohol abuse.  [name] GERD (gastroesophageal reflux disease)   . H/O hiatal hernia   . History of acute cholangitis   . History of GI diverticular bleed   . Hyperlipidemia   . Hypertension   . Hypothyroidism   . IBS (irritable bowel syndrome)   . Pancreatitis   . Peripheral vascular disease ([place])   . PONV (postoperative nausea and vomiting)    with Breast reduction- only time  . Rosacea   . Seizures ([place]) 11/13/2016   Thopught to be from withdrawl Benzodiazepine  . Stroke [facility])    patient denies-  . Thyroid disease    Past Surgical History:  Past Surgical History:  Procedure Laterality Date  . ABDOMINAL HYSTERECTOMY     partial  . BACK SURGERY  2010   thoractic-screws and rods  . BILE DUCT STENT PLACEMENT  08/26/2014  . BREAST REDUCTION SURGERY    . CHOLECYSTECTOMY  1988   lap  . COLONOSCOPY    .  COLONOSCOPY Left 09/08/2014   Procedure: COLONOSCOPY;  Surgeon: Arta Silence, MD;  Location: [facility] ENDOSCOPY;  Service: Endoscopy;  Laterality: Left;  . ENDARTERECTOMY Left 12/28/2016  . ENDARTERECTOMY Left 12/28/2016   Procedure: ENDARTERECTOMY CAROTID LEFT WITH XENOSURE BIOLOGIC PATCH ANGIOPLASTY;  Surgeon: Angelia Mould, MD;  Location: [facility];  Service: Vascular;  Laterality: Left;  . ERCP    . ERCP N/A 08/26/2014   Procedure: ENDOSCOPIC RETROGRADE CHOLANGIOPANCREATOGRAPHY (ERCP);  Surgeon: Jeryl Columbia, MD;  Location: [facility] ENDOSCOPY;  Service: Endoscopy;  Laterality: N/A;  . ERCP N/A 09/11/2014   Procedure:  ENDOSCOPIC RETROGRADE CHOLANGIOPANCREATOGRAPHY (ERCP);  Surgeon: Arta Silence, MD;  Location: [facility];  Service: Endoscopy;  Laterality: N/A;  . EUS N/A 09/11/2014   Procedure: ESOPHAGEAL ENDOSCOPIC ULTRASOUND (EUS) RADIAL;  Surgeon: Arta Silence, MD;  Location: [facility] ENDOSCOPY;  Service: Endoscopy;  Laterality: N/A;  . EYE SURGERY Bilateral    Cataract  . JOINT REPLACEMENT  2005   left shoulder replacement   . ORIF CLAVICULAR FRACTURE Left 07/18/2014   Procedure: OPEN REDUCTION INTERNAL FIXATION (ORIF) LEFT CLAVICULAR FRACTURE;  Surgeon: Ninetta Lights, MD;  Location: [place];  Service: Orthopedics;  Laterality: Left;  . SPYGLASS CHOLANGIOSCOPY N/A 09/11/2014   Procedure: SPYGLASS CHOLANGIOSCOPY;  Surgeon: Arta Silence, MD;  Location: [facility] ENDOSCOPY;  Service: Endoscopy;  Laterality: N/A;  . TONSILLECTOMY  as child  . TOTAL KNEE ARTHROPLASTY Left 03/10/2016   Procedure: LEFT TOTAL KNEE ARTHROPLASTY;  Surgeon: Ninetta Lights, MD;  Location: [place];  Service: Orthopedics;  Laterality: Left;  [name] VENTRAL HERNIA REPAIR  06/08/2012   Procedure: LAPAROSCOPIC VENTRAL HERNIA;  Surgeon: Adin Hector, MD;  Location: [facility] ORS;  Service: General;  Laterality: Right;    Assessment / Plan / Recommendation Clinical Impression   GLENN CHRISTO is an 83 year old female with history of HTN, PAF--no AC due to falls, psoriatic arthritis, anxiety/depression, chronic diastolic CHF, who was admitted on 03/09/19 after falling in the middle of the night and laid on the tile floor till family was able to get help next morning. Per reports, has had increased in falls with worsening of tremors and weakness --has been spending most of the day in bed for past 2 weeks. She was found to be hypothermic with temp 93 degrees, was confused with strong odor of urine. She was warmed by bear hugger, started on IVF for AKI with evidence of rhabdomyolysis as well as antibiotics empirically due to concerns of sepsis.  Urine/CXR negative for infectious work up and IV antibiotics transitioned to Augmentin due to concerns of aspiration PNA. 1/4 BC positive for gram negative rods and felt to be contaminant.   On CIWA protocol due to concerns of alcohol abuse---home dose librium increased to 10 mg tid. She has had issues with A fib with RVR treated with Cardizem drip. Rectal pouch placed due to diarrhea.Fluid overload treated with dose of lasix today.Therapy ongoing and patient noted to be debilitated with difficulty standing or performing ADLs as well as problems processing with STM deficits. CIR recommended due to functional deficits. SLP evaluation was completed on 03/14/2019 with the following results:  Pt presents with mild higher level cognitive deficits characterized by decreased storage and retrieval of information and decreased complex problem solving.  Per pt, previous level of function sounds relatively low as pt's daughter was providing care for most ADLs.  Therefore, I'm uncertain to what extent these deficits are baseline versus acute.  Pt seems at times to  be slightly confused regarding course of events leading up to her hospitalization and intermittently transposes the course of her husband's hospitalization and death with her current hospitalization.  Suspect depression or at least difficulty coping with multiple life changing events and pt would benefit from neuropsych follow up while inpatient.  Overall, pt requires min assist to complete tasks due to deficits mentioned above.   Pt would benefit from ST while inpatient in order to maximize functional independence and reduce burden of care prior to discharge.  Anticipate that pt will need 24/7 supervision at discharge but do not expect that pt will need ST follow up at next level of care.    Skilled Therapeutic Interventions          Cognitive-linguistic evaluation completed with results and recommendations reviewed with patient.     SLP Assessment   Patient will need skilled [place] [facility] during CIR admission    Recommendations  Recommendations for Other Services: Neuropsych consult Patient destination: Home Follow up Recommendations: 24 hour supervision/assistance Equipment Recommended: None recommended by SLP    SLP Frequency 3 to 5 out of 7 days   SLP Duration  SLP Intensity  SLP Treatment/Interventions 14-17 days   Minumum of 1-2 x/day, 30 to 90 minutes  Cognitive remediation/compensation;Cueing hierarchy;Internal/external aids;Patient/family education;Functional tasks    Pain Pain Assessment Pain Scale: 0-10 Pain Score: 0-No pain  Prior Functioning Cognitive/Linguistic Baseline: Baseline deficits Baseline deficit details: suspected cognitive deficits at baseline versus psych/behavioral Type of Home: House  Lives With: Daughter Available Help at Discharge: Family;Available 24 hours/day  Short Term Goals: Week 1: SLP Short Term Goal 1 (Week 1): Pt will complete mildly complex tasks with supervision cues for functional problem solving.   SLP Short Term Goal 2 (Week 1): Pt will recall complex, daily information with supervision cues for use of external aids.   SLP Short Term Goal 3 (Week 1): Pt will anticipate barriers to discharge and generate appropriate solutions with supervision cues.    Refer to Care Plan for Long Term Goals  Recommendations for other services: Neuropsych  Discharge Criteria: Patient will be discharged from SLP if patient refuses treatment 3 consecutive times without medical reason, if treatment goals not met, if there is a change in medical status, if patient makes no progress towards goals or if patient is discharged from hospital.  The above assessment, treatment plan, treatment alternatives and goals were discussed and mutually agreed upon: by patient  Emilio Math 03/14/2019, 10:47 AM

## 2019-03-14 NOTE — Evaluation (Signed)
Occupational Therapy Assessment and Plan  Patient Details  Name: [name] [name] MRN: [id number] Date of Birth: 1935-12-19  OT Diagnosis: acute pain, cognitive deficits, muscular wasting and disuse atrophy and muscle weakness (generalized) Rehab Potential: Rehab Potential (ACUTE ONLY): Good ELOS: 14-16 days   Today's Date: 03/14/2019 OT Individual Time: 6203-5597 OT Individual Time Calculation (min): 70 min     Problem List:  Patient Active Problem List   Diagnosis Date Noted  . Debility 03/13/2019  . Hypothermia 03/09/2019  . Elevated liver enzymes 03/09/2019  . Thrombocytopenia ([place]) 03/09/2019  . Hyperkalemia 07/23/2017  . AKI (acute kidney injury) ([place]) 07/23/2017  . 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 Fall 06/26/2017  . Intractable nausea and vomiting 03/11/2017  . Acute lower UTI 03/11/2017  . Carotid stenosis 12/28/2016  . Sedative, hypnotic, or anxiolytic withdrawal delirium 11/15/2016  . Paroxysmal atrial fibrillation (H[facility])   . Cerebrovascular disease   . Fever   . Acute encephalopathy 11/10/2016  . Hypothyroidism 11/10/2016  . Hypertensive urgency 11/10/2016  . Altered mental state 11/10/2016  . Seizure ([place]) 11/10/2016  . Chronic diastolic CHF (congestive heart failure) ([place])   . Left carotid stenosis 10/29/2016  . S/P total knee replacement 03/10/2016  . 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 Ileus ([place]) 09/05/2014  . Leukocytosis 09/05/2014  . Vomiting 09/04/2014  . Pulmonary edema 09/04/2014  . Hypokalemia 08/28/2014  . Enteritis due to Clostridium difficile 08/27/2014  . 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 Protein calorie malnutrition ([place]) 08/24/2014  . Obstructive jaundice 08/23/2014  . Acute cholangitis 08/23/2014  . ARF (acute renal failure) ([place]) 08/23/2014  . Coagulopathy ([place]) 08/23/2014  . Back pain   . Arthritis   . Hypertension   . GERD (gastroesophageal reflux disease)   . Anxiety   .  IBS (irritable bowel syndrome)   . Hernia of Right posterior flank 04/18/2012    Past Medical History:  Past Medical History:  Diagnosis Date  . Alopecia   . Anemia    hx of years ago   . Anxiety   . ARF (acute renal failure) ([place]) 08/23/2014  . Arthritis    psoriatic arthritis  . Back pain   . Blood transfusion    at age of 4  . Carotid stenosis 12/2016  . Chronic diastolic CHF (congestive heart failure) ([place])   . Depression   . Dysrhythmia 11/13/2016   PAFib for a short time- Echo done 11/14/16 PAF- to follow up with PCP- to see if she is a candiate for anticoags.  Not started at times time due to alcohol abuse.  [name] GERD (gastroesophageal reflux disease)   . H/O hiatal hernia   . History of acute cholangitis   . History of GI diverticular bleed   . Hyperlipidemia   . Hypertension   . Hypothyroidism   . IBS (irritable bowel syndrome)   . Pancreatitis   . Peripheral vascular disease ([place])   . PONV (postoperative nausea and vomiting)    with Breast reduction- only time  . Rosacea   . Seizures ([place]) 11/13/2016   Thopught to be from withdrawl Benzodiazepine  . Stroke [facility])    patient denies-  . Thyroid disease    Past Surgical History:  Past Surgical History:  Procedure Laterality Date  . ABDOMINAL HYSTERECTOMY     partial  . BACK SURGERY  2010   thoractic-screws and rods  . BILE DUCT STENT PLACEMENT  08/26/2014  . BREAST  REDUCTION SURGERY    . CHOLECYSTECTOMY  1988   lap  . COLONOSCOPY    . COLONOSCOPY Left 09/08/2014   Procedure: COLONOSCOPY;  Surgeon: Arta Silence, MD;  Location: [facility] ENDOSCOPY;  Service: Endoscopy;  Laterality: Left;  . ENDARTERECTOMY Left 12/28/2016  . ENDARTERECTOMY Left 12/28/2016   Procedure: ENDARTERECTOMY CAROTID LEFT WITH XENOSURE BIOLOGIC PATCH ANGIOPLASTY;  Surgeon: Angelia Mould, MD;  Location: [place];  Service: Vascular;  Laterality: Left;  . ERCP    . ERCP N/A 08/26/2014   Procedure: ENDOSCOPIC RETROGRADE  CHOLANGIOPANCREATOGRAPHY (ERCP);  Surgeon: Jeryl Columbia, MD;  Location: [facility] ENDOSCOPY;  Service: Endoscopy;  Laterality: N/A;  . ERCP N/A 09/11/2014   Procedure: ENDOSCOPIC RETROGRADE CHOLANGIOPANCREATOGRAPHY (ERCP);  Surgeon: Arta Silence, MD;  Location: [facility] ENDOSCOPY;  Service: Endoscopy;  Laterality: N/A;  . EUS N/A 09/11/2014   Procedure: ESOPHAGEAL ENDOSCOPIC ULTRASOUND (EUS) RADIAL;  Surgeon: Arta Silence, MD;  Location: [facility] ENDOSCOPY;  Service: Endoscopy;  Laterality: N/A;  . EYE SURGERY Bilateral    Cataract  . JOINT REPLACEMENT  2005   left shoulder replacement   . ORIF CLAVICULAR FRACTURE Left 07/18/2014   Procedure: OPEN REDUCTION INTERNAL FIXATION (ORIF) LEFT CLAVICULAR FRACTURE;  Surgeon: Ninetta Lights, MD;  Location: [place];  Service: Orthopedics;  Laterality: Left;  . SPYGLASS CHOLANGIOSCOPY N/A 09/11/2014   Procedure: SPYGLASS CHOLANGIOSCOPY;  Surgeon: Arta Silence, MD;  Location: [facility] ENDOSCOPY;  Service: Endoscopy;  Laterality: N/A;  . TONSILLECTOMY  as child  . TOTAL KNEE ARTHROPLASTY Left 03/10/2016   Procedure: LEFT TOTAL KNEE ARTHROPLASTY;  Surgeon: Ninetta Lights, MD;  Location: [place];  Service: Orthopedics;  Laterality: Left;  [name] VENTRAL HERNIA REPAIR  06/08/2012   Procedure: LAPAROSCOPIC VENTRAL HERNIA;  Surgeon: Adin Hector, MD;  Location: [facility] ORS;  Service: General;  Laterality: Right;    Assessment & Plan Clinical Impression: Patient is a 83 y.o. year old female with history of HTN, PAF--no AC due to falls, psoriatic arthritis, anxiety/depression, chronic diastolic CHF, who was admitted on 03/09/19 after falling in the middle of the night and laid on the tile floor till family was able to get help next morning. Per reports, has had increased in falls with worsening of tremors and weakness --has been spending most of the day in bed for past 2 weeks. She was found to be hypothermic with temp 93 degrees, was confused with strong odor of urine. She was  warmed by bear hugger, started on IVF for AKI with evidence of rhabdomyolysis as well as antibiotics empirically due to concerns of sepsis. Urine/CXR negative for infectious work up and IV antibiotics transitioned to Augmentin due to concerns of aspiration PNA. 1/4 BC positive for gram negative rods and felt to be contaminant.   On CIWA protocol due to concerns of alcohol abuse---home dose librium increased to 10 mg tid. She has had issues with A fib with RVR treated with Cardizem drip. Rectal pouch placed due to diarrhea.Fluid overload treated with dose of lasix today.Therapy ongoing and patient noted to be debilitated with difficulty standing or performing ADLs as well as problems processing with STM deficits. CIR recommended due to functional deficits.   Patient transferred to CIR on 03/13/2019 .    Patient currently requires min-max with basic self-care skills secondary to muscle weakness, decreased cardiorespiratoy endurance, decreased awareness, decreased problem solving, decreased safety awareness, decreased memory and delayed processing and decreased standing balance and decreased balance strategies.  Prior to hospitalization, patient could complete ADLs  with assistance from daughter since 07-Dec-2018 (after husband died).  Patient will benefit from skilled intervention to increase independence with basic self-care skills and increase level of independence with iADL prior to discharge home with care partner.  Anticipate patient will require intermittent supervision and follow up home health.  OT - End of Session Activity Tolerance: Tolerates 30+ min activity with multiple rests Endurance Deficit: Yes Endurance Deficit Description: fatigues quickly with self-care task OT Assessment Rehab Potential (ACUTE ONLY): Good OT Patient demonstrates impairments in the following area(s): Balance;Cognition;Endurance;Motor;Pain;Safety OT Basic ADL's Functional Problem(s):  Grooming;Bathing;Dressing;Toileting OT Advanced ADL's Functional Problem(s): Simple Meal Preparation OT Transfers Functional Problem(s): Toilet;Tub/Shower OT Additional Impairment(s): None OT Plan OT Intensity: Minimum of 1-2 x/day, 45 to 90 minutes OT Frequency: 5 out of 7 days OT Duration/Estimated Length of Stay: 14-16 days OT Treatment/Interventions: Balance/vestibular training;Cognitive remediation/compensation;Community reintegration;Discharge planning;Disease mangement/prevention;DME/adaptive equipment instruction;Functional mobility training;Pain management;Patient/family education;Psychosocial support;Self Care/advanced ADL retraining;Skin care/wound managment;Therapeutic Activities;Therapeutic Exercise;Splinting/orthotics;UE/LE Strength taining/ROM OT Self Feeding Anticipated Outcome(s): Mod I OT Basic Self-Care Anticipated Outcome(s): Supervision OT Toileting Anticipated Outcome(s): Supervision OT Bathroom Transfers Anticipated Outcome(s): Supervision OT Recommendation Recommendations for Other Services: Neuropsych consult;Therapeutic Recreation consult Therapeutic Recreation Interventions: Stress management Patient destination: Home Follow Up Recommendations: Home health OT Equipment Recommended: Tub/shower bench;To be determined   Skilled Therapeutic Intervention OT eval completed with discussion of rehab process, OT purpose, POC, ELOS, and goals.  ADL assessment completed with bathing and dressing at sit > stand level at sink.  Pt reports daughter assisting with self-care tasks since Dec 07, 2018 (after her husband died).  She has been bathing at sink as shower is upstairs and did not have the energy to get upstairs.  Pt completed UB bathing at sink this session with min assist and then required total assist for LB bathing/dressing due to decreased endurance.  Pt able to stand with min assist to complete oral care.  Completed stand pivot transfer w/c <> toilet with min assist with  grab bar.  Pt reports desire to return to working out and cooking.  Pt transferred back to bed at end of session due to fatigue.  Pt left semi-reclined with all needs in reach.  OT Evaluation Precautions/Restrictions  Precautions Precautions: Fall Restrictions Weight Bearing Restrictions: No Pain Pain Assessment Pain Scale: 0-10 Pain Score: 5  Pain Type: Chronic pain Pain Location: Hand Pain Orientation: Right Pain Descriptors / Indicators: Aching Pain Onset: On-going Patients Stated Pain Goal: 0 Pain Intervention(s): Medication (See eMAR) Multiple Pain Sites: No Home Living/Prior Functioning Home Living Family/patient expects to be discharged to:: Private residence Living Arrangements: Children Available Help at Discharge: Family, Available 24 hours/day Type of Home: House Home Access: Stairs to enter Technical brewer of Steps: 1 Entrance Stairs-Rails: None Home Layout: Other (Comment), Two level, 1/2 bath on main level, Bed/bath upstairs(full bath first level, sleeps on couch) Bathroom Shower/Tub: Chiropodist: Standard Bathroom Accessibility: Yes Additional Comments: patient sleeps on couch on main level; was doing this over past few months  Lives With: Daughter IADL History Homemaking Responsibilities: Yes Meal Prep Responsibility: Secondary Laundry Responsibility: No Cleaning Responsibility: No Bill Paying/Finance Responsibility: No Homemaking Comments: daughter had been completing majority of laundry and cleaning and they ordered majority of their food Current License: No(driving until Dec 6270) Prior Function Level of Independence: Needs assistance with gait, Needs assistance with tranfers, Needs assistance with ADLs, Needs assistance with homemaking  Able to Take Stairs?: Yes Driving: No Vocation: Retired ADL ADL Grooming: Minimal assistance Where Assessed-Grooming: Standing  at sink Upper Body Bathing: Minimal assistance Where  Assessed-Upper Body Bathing: Standing at sink Lower Body Bathing: Dependent Where Assessed-Lower Body Bathing: Sitting at sink, Standing at sink Upper Body Dressing: Moderate assistance Where Assessed-Upper Body Dressing: Sitting at sink Lower Body Dressing: Dependent Where Assessed-Lower Body Dressing: Sitting at sink, Standing at sink Toileting: Not assessed Toilet Transfer: Minimal assistance Toilet Transfer Method: Stand pivot Toilet Transfer Equipment: Grab bars Vision Baseline Vision/History: Wears glasses Wears Glasses: Reading only Patient Visual Report: No change from baseline Vision Assessment?: No apparent visual deficits Perception  Perception: Within Functional Limits Praxis Praxis: Intact Cognition Overall Cognitive Status: Impaired/Different from baseline Arousal/Alertness: Awake/alert Orientation Level: Person;Place;Situation Person: Oriented Place: Oriented Situation: Oriented Year: 2020 Month: April Day of Week: Correct Memory: Impaired Memory Impairment: Storage deficit;Retrieval deficit;Decreased recall of new information Immediate Memory Recall: Sock;Blue;Bed Memory Recall: Blue(stated "bed or couch" and when asked to choose one, stated "couch") Memory Recall Blue: With Cue Attention: Sustained Sustained Attention: Appears intact Awareness: Impaired Awareness Impairment: Anticipatory impairment Problem Solving: Impaired Problem Solving Impairment: Functional complex Executive Function: Self Monitoring;Self Correcting Self Monitoring: Impaired Self Monitoring Impairment: Functional complex Self Correcting: Impaired Self Correcting Impairment: Functional complex Behaviors: Lability Safety/Judgment: Impaired Sensation Sensation Light Touch: Appears Intact Proprioception: Appears Intact Coordination Gross Motor Movements are Fluid and Coordinated: No Fine Motor Movements are Fluid and Coordinated: No Coordination and Movement Description:  impaired by generalized weakness, shakiness noted Finger Nose Finger Test: slow, shaky Motor  Motor Motor: Within Functional Limits Mobility  Bed Mobility Bed Mobility: Rolling Right;Rolling Left;Supine to Sit;Sit to Supine Rolling Right: Minimal Assistance - Patient > 75% Rolling Left: Minimal Assistance - Patient > 75% Supine to Sit: Moderate Assistance - Patient 50-74% Sit to Supine: Moderate Assistance - Patient 50-74% Transfers Stand to Sit: Moderate Assistance - Patient 50-74%  Trunk/Postural Assessment  Cervical Assessment Cervical Assessment: Exceptions to WFL(forward head) Thoracic Assessment Thoracic Assessment: Within Functional Limits Lumbar Assessment Lumbar Assessment: Exceptions to WFL(posterior pelvic tilt) Postural Control Postural Control: Deficits on evaluation Righting Reactions: delayed  Balance Balance Balance Assessed: Yes Static Sitting Balance Static Sitting - Balance Support: No upper extremity supported;Feet supported Static Sitting - Level of Assistance: 5: Stand by assistance Dynamic Sitting Balance Dynamic Sitting - Balance Support: No upper extremity supported;Feet supported Dynamic Sitting - Level of Assistance: 4: Min Insurance risk surveyor Standing - Balance Support: Bilateral upper extremity supported;During functional activity Static Standing - Level of Assistance: 4: Min assist Dynamic Standing Balance Dynamic Standing - Balance Support: Bilateral upper extremity supported;During functional activity Dynamic Standing - Level of Assistance: 4: Min assist Extremity/Trunk Assessment RUE Assessment RUE Assessment: Within Functional Limits Passive Range of Motion (PROM) Comments: WFL Active Range of Motion (AROM) Comments: WFL General Strength Comments: strength grossly 3+/5 LUE Assessment LUE Assessment: Within Functional Limits Passive Range of Motion (PROM) Comments: WFL Active Range of Motion (AROM) Comments: WFL (h/o  shoulder surgery) General Strength Comments: grossly 4/5     Refer to Care Plan for Long Term Goals  Recommendations for other services: Neuropsych and Therapeutic Recreation  Stress management   Discharge Criteria: Patient will be discharged from OT if patient refuses treatment 3 consecutive times without medical reason, if treatment goals not met, if there is a change in medical status, if patient makes no progress towards goals or if patient is discharged from hospital.  The above assessment, treatment plan, treatment alternatives and goals were discussed and mutually agreed upon: by patient  Simonne Come 03/14/2019, 2:37  PM  

## 2019-03-15 ENCOUNTER — Inpatient Hospital Stay (HOSPITAL_COMMUNITY): Payer: Self-pay

## 2019-03-15 ENCOUNTER — Ambulatory Visit: Payer: Medicare Other | Admitting: Podiatry

## 2019-03-15 ENCOUNTER — Inpatient Hospital Stay (HOSPITAL_COMMUNITY): Payer: Self-pay | Admitting: Physical Therapy

## 2019-03-15 ENCOUNTER — Inpatient Hospital Stay (HOSPITAL_COMMUNITY): Payer: Self-pay | Admitting: Speech Pathology

## 2019-03-15 MED ORDER — SORBITOL 70 % SOLN
30.0000 mL | Freq: Every day | Status: DC | PRN
  Filled 2019-03-15: qty 30

## 2019-03-15 MED ORDER — SENNA 8.6 MG PO TABS
2.0000 | ORAL_TABLET | Freq: Every day | ORAL | Status: DC
  Administered 2019-03-15 – 2019-04-02 (×17): 17.2 mg via ORAL
  Filled 2019-03-15 (×19): qty 2

## 2019-03-15 MED ORDER — KETOTIFEN FUMARATE 0.025 % OP SOLN
1.0000 [drp] | Freq: Two times a day (BID) | OPHTHALMIC | Status: DC
  Administered 2019-03-15 – 2019-04-03 (×38): 1 [drp] via OPHTHALMIC
  Filled 2019-03-15: qty 5

## 2019-03-15 NOTE — Progress Notes (Signed)
Physical Therapy Session Note  Patient Details  Name: [name] [name] MRN: [id number] Date of Birth: 01/28/1936  Today's Date: 03/15/2019 PT Individual Time: 1330-1445 PT Individual Time Calculation (min): 75 min  Short Term Goals: Week 1:  PT Short Term Goal 1 (Week 1): Pt will complete least restrictive transfers with min A consistently PT Short Term Goal 2 (Week 1): Pt will ambulate x 25 ft with LRAD and min A  Skilled Therapeutic Interventions/Progress Updates:    Pt received seated in w/c in room, agreeable to PT session. No complaints of pain. Sit to stand with min A to RW. Ambulation x 75', x 50' with RW and min A, improved tolerance for gait this date. Nustep level 3 x 10 min with use of B UE/LE for global endurance training. Adjusted wheelchair height for better fit due to patient's short stature, improved fit with adjustment though w/c is still too tall for patient. Standing alt L/R 4" step taps 2 x 10 reps with BUE support on RW and min A for balance. Pt left seated in w/c in room with needs in reach at end of session, quick release belt and chair alarm in place.  Therapy Documentation Precautions:  Precautions Precautions: Fall Restrictions Weight Bearing Restrictions: No     Therapy/Group: Individual Therapy   Excell Seltzer, PT, DPT  03/15/2019, 3:48 PM

## 2019-03-15 NOTE — Progress Notes (Signed)
Physical Therapy Session Note  Patient Details  Name: [name] [name] MRN: [id number] Date of Birth: [birth year]  Today's Date: 03/15/2019 PT Individual Time: 1145-1230 PT Individual Time Calculation (min): 45 min   Short Term Goals: Week 1:  PT Short Term Goal 1 (Week 1): Pt will complete least restrictive transfers with min A consistently PT Short Term Goal 2 (Week 1): Pt will ambulate x 25 ft with LRAD and min A  Skilled Therapeutic Interventions/Progress Updates:    Patient in w/c in room agreeable to PT, reports feeling better today but arthritic pain in hands and swelling a little annoying.  Assisted in w/c to ortho gym.  Obtained youth walker for pt to use.  Sit to stand min A and gait x 20' with min A.  Performed car transfer mod A to sedan height seat lifting assist to stand.  Patient negotiated 8 3" steps with bilateral rails and min A.  Ambulated x 25' with min A with RW.  Patient performed sit<>stand x 5 from mat with min A for LE strength.  Standing balance performed partial Berg with results as noted below.  Assisted in w/c back to room and left with belt alarm, call bell and needs in reach.  Therapy Documentation Precautions:  Precautions Precautions: Fall Restrictions Weight Bearing Restrictions: No Pain: Pain Assessment Pain Score: 4  Pain Type: Chronic pain Pain Location: Hand Pain Orientation: Right;Left Pain Descriptors / Indicators: Aching Pain Onset: On-going Pain Intervention(s): Repositioned;Distraction     Balance: Standardized Balance Assessment Standardized Balance Assessment: Berg Balance Test Berg Balance Test Sit to Stand: Able to stand  independently using hands Standing Unsupported: Able to stand 2 minutes with supervision Sitting with Back Unsupported but Feet Supported on Floor or Stool: Able to sit safely and securely 2 minutes Stand to Sit: Controls descent by using hands Transfers: Able to transfer with verbal cueing and /or  supervision Standing Unsupported with Eyes Closed: Able to stand 10 seconds with supervision Standing Ubsupported with Feet Together: Needs help to attain position but able to stand for 30 seconds with feet together From Standing, Reach Forward with Outstretched Arm: Can reach forward >12 cm safely (5") From Standing Position, Turn to Look Behind Over each Shoulder: Needs supervision when turning    Therapy/Group: Individual Therapy  Reginia Naas  [place], [place] 03/15/2019, 4:42 PM

## 2019-03-15 NOTE — Progress Notes (Signed)
Speech Language Pathology Daily Session Note  Patient Details  Name: [name] [name] MRN: [id number] Date of Birth: Jun 03, 1936  Today's Date: 03/15/2019 SLP Individual Time: 0905-1000 SLP Individual Time Calculation (min): 55 min  Short Term Goals: Week 1: SLP Short Term Goal 1 (Week 1): Pt will complete mildly complex tasks with supervision cues for functional problem solving.   SLP Short Term Goal 2 (Week 1): Pt will recall complex, daily information with supervision cues for use of external aids.   SLP Short Term Goal 3 (Week 1): Pt will anticipate barriers to discharge and generate appropriate solutions with supervision cues.    Skilled Therapeutic Interventions:  Pt was seen for skilled ST targeting cognitive goals.  SLP facilitated the session with medication management tasks to address recall of new information.  Pt was independent for recalling function of medications when named when medications were known to her from prior to admission; however, she was unable to recall function of newly scheduled medications.  Pt reports ongoing complaints of feeling "foggy."  Pt was returned to room and left in wheelchair with chair alarm set and call bell within reach.    Pain Pain Assessment Pain Scale: 0-10 Pain Score: 6  Pain Type: Chronic pain Pain Location: Hand Pain Orientation: Right;Left Pain Descriptors / Indicators: Aching Pain Frequency: Intermittent Pain Onset: On-going Patients Stated Pain Goal: 0 Pain Intervention(s): Medication (See eMAR) Multiple Pain Sites: No  Therapy/Group: Individual Therapy  [name] [name], Selinda Orion 03/15/2019, 9:56 AM

## 2019-03-15 NOTE — Progress Notes (Signed)
[facility] PHYSICAL MEDICINE & REHABILITATION PROGRESS NOTE   Subjective/Complaints: Had a fairly good day yesterday. Frustrated that she can't move better. States her legs are "sore" and that they were sore before coming to hospital   ROS: Patient denies fever, rash, sore throat, blurred vision, nausea, vomiting, diarrhea, cough, shortness of breath or chest pain, joint or back pain, headache, or mood change.    Objective:   No results found. Recent Labs    03/13/19 0542 03/14/19 0600  WBC 8.1 7.3  HGB 10.1* 10.6*  HCT 32.0* 34.8*  PLT 105* 115*   Recent Labs    03/13/19 0542 03/14/19 0600  NA 142 140  K 3.4* 3.6  CL 114* 111  CO2 18* 18*  GLUCOSE 108* 113*  BUN 20 23  CREATININE 1.33* 1.36*  CALCIUM 8.4* 8.3*    Intake/Output Summary (Last 24 hours) at 03/15/2019 0938 Last data filed at 03/15/2019 0800 Gross per 24 hour  Intake 440 ml  Output 650 ml  Net -210 ml     Physical Exam: Vital Signs Blood pressure (!) 170/71, pulse 74, temperature 98 F (36.7 C), temperature source Oral, resp. rate 18, height 5\' 1"  (1.549 m), weight 80.2 kg, SpO2 94 %. Constitutional: No distress . Vital signs reviewed. HEENT: EOMI, oral membranes moist. Ecchymoses around eyes Neck: supple Cardiovascular: RRR without murmur. No JVD    Respiratory: CTA Bilaterally without wheezes or rales. Normal effort    GI: BS +, non-tender, non-distended   Musculoskeletal:  General: Edema(bilateral ankles/feet)1+.  Comments:   Right hand and wrist tender with ulnar deviation of fingers ongoing. Neurological: She is alertand oriented to person, place, and time.tangential, distracted sometimes.   Appropriate language, functional memory. RUE 3/5. LUE 4/5. Bilateral LE 2+/5 HF, 3/5 KE and 3+/5 ADF/PF. Senses pain in all 4's. Skin: Skin iswarmand dry. Left TKA scar.persistenterythema RUE. Areas of bruising on arms Psychiatric:  Pleasant and  cooperative    Assessment/Plan: 1. Functional deficits secondary to rhabdomyolysis, debility which require 3+ hours per day of interdisciplinary therapy in a comprehensive inpatient rehab setting.  Physiatrist is providing close team supervision and 24 hour management of active medical problems listed below.  Physiatrist and rehab team continue to assess barriers to discharge/monitor patient progress toward functional and medical goals  Care Tool:  Bathing    Body parts bathed by patient: Right arm, Left arm, Chest, Abdomen, Face   Body parts bathed by helper: Front perineal area, Buttocks, Right upper leg, Left upper leg, Right lower leg, Left lower leg     Bathing assist Assist Level: Maximal Assistance - Patient 24 - 49%     Upper Body Dressing/Undressing Upper body dressing   What is the patient wearing?: Pull over shirt    Upper body assist Assist Level: Moderate Assistance - Patient 50 - 74%    Lower Body Dressing/Undressing Lower body dressing      What is the patient wearing?: Pants     Lower body assist Assist for lower body dressing: Total Assistance - Patient < 25%     Toileting Toileting    Toileting assist Assist for toileting: Maximal Assistance - Patient 25 - 49%     Transfers Chair/bed transfer  Transfers assist  Chair/bed transfer activity did not occur: Safety/medical concerns  Chair/bed transfer assist level: Minimal Assistance - Patient > 75%     Locomotion Ambulation   Ambulation assist      Assist level: Minimal Assistance - Patient > 75% Assistive device:  Walker-rolling Max distance: 10'   Walk 10 feet activity   Assist     Assist level: Minimal Assistance - Patient > 75% Assistive device: Walker-rolling   Walk 50 feet activity   Assist Walk 50 feet with 2 turns activity did not occur: Safety/medical concerns         Walk 150 feet activity   Assist Walk 150 feet activity did not occur: Safety/medical  concerns         Walk 10 feet on uneven surface  activity   Assist Walk 10 feet on uneven surfaces activity did not occur: Safety/medical concerns         Wheelchair     Assist Will patient use wheelchair at discharge?: No             Wheelchair 50 feet with 2 turns activity    Assist            Wheelchair 150 feet activity     Assist          Medical Problem List and Plan: 1.Functional mobility deficitssecondary to rhabdomyolysis, pneumonia, ETOH abuse. --Continue CIR therapies including PT, OT, and SLP   2. Antithrombotics: -DVT/anticoagulation:Pharmaceutical:Lovenox -antiplatelet therapy: ASA 3. Pain Management:tylenol prn. 4. Mood:team to provide ego support. LCSW assessment  -will ask Pschology to see also  -baseline anxiety present but not overwhelming. -antipsychotic agents: On Seroquel and librium(used 10 mg bid for anxiety). 5. Neuropsych: This patientis notcapable of making decisions on herown behalf. 6. Skin/Wound Care:Routine pressure relief measures. 7. Fluids/Electrolytes/Nutrition:encourage PO, eating well as of late  -BUN sl elevated--push fluids 8. Aspiration PNA?:Day 3 antibiotics--transition toAugment fora week ofantibiotic course. 9. AKI with mild rhabdomyolysis: Cr sl elevated 1.36  -push fluids  -recheck BMET tomorrow 10.Chronic diastolic CHF: NEED daily weights  -stable at present [facility] Weights   03/13/19 1800 03/14/19 0512  Weight: 80 kg 80.2 kg    11. H/o anxiety/depression: Lost husband 12/19.Managed by Dr. Vilma Meckel and librium. 12. Abnormal LFTs:normal 4/1 13. Fall risk: Continue high low bed.  14. Diarrhea: Continue rectal tube as is incontinent of bowel and has irritatedskin  -added probiotic  -no bm for several days  -add senna scheduled at bedtime  -give miralax today  -sorbitol prn    LOS: 2 days A FACE TO  FACE EVALUATION WAS PERFORMED  Meredith Staggers 03/15/2019, 9:38 AM

## 2019-03-15 NOTE — Evaluation (Signed)
Recreational Therapy Assessment and Plan  Patient Details  Name: [name] [name] MRN: [id number] Date of Birth: 06-17-1936 Today's Date: 03/15/2019  Rehab Potential:  Good  ELOS:   2 weeks Assessment    Problem List:      Patient Active Problem List   Diagnosis Date Noted  . Debility 03/13/2019  . Hypothermia 03/09/2019  . Elevated liver enzymes 03/09/2019  . Thrombocytopenia ([place]) 03/09/2019  . Hyperkalemia 07/23/2017  . AKI (acute kidney injury) ([place]) 07/23/2017  . 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 Fall 06/26/2017  . Intractable nausea and vomiting 03/11/2017  . Acute lower UTI 03/11/2017  . Carotid stenosis 12/28/2016  . Sedative, hypnotic, or anxiolytic withdrawal delirium 11/15/2016  . Paroxysmal atrial fibrillation (HCC)   . Cerebrovascular disease   . Fever   . Acute encephalopathy 11/10/2016  . Hypothyroidism 11/10/2016  . Hypertensive urgency 11/10/2016  . Altered mental state 11/10/2016  . Seizure ([place]) 11/10/2016  . Chronic diastolic CHF (congestive heart failure) ([place])   . Left carotid stenosis 10/29/2016  . S/P total knee replacement 03/10/2016  . 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 Ileus ([place]) 09/05/2014  . Leukocytosis 09/05/2014  . Vomiting 09/04/2014  . Pulmonary edema 09/04/2014  . Hypokalemia 08/28/2014  . Enteritis due to Clostridium difficile 08/27/2014  . 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 Protein calorie malnutrition ([place]) 08/24/2014  . Obstructive jaundice 08/23/2014  . Acute cholangitis 08/23/2014  . ARF (acute renal failure) ([place]) 08/23/2014  . Coagulopathy ([place]) 08/23/2014  . Back pain   . Arthritis   . Hypertension   . GERD (gastroesophageal reflux disease)   . Anxiety   . IBS (irritable bowel syndrome)   . Hernia of Right posterior flank 04/18/2012    Past Medical History:      Past Medical History:  Diagnosis Date  . Alopecia   . Anemia    hx of  years ago   . Anxiety   . ARF (acute renal failure) ([place]) 08/23/2014  . Arthritis    psoriatic arthritis  . Back pain   . Blood transfusion    at age of 4  . Carotid stenosis 12/2016  . Chronic diastolic CHF (congestive heart failure) ([place])   . Depression   . Dysrhythmia 11/13/2016   PAFib for a short time- Echo done 11/14/16 PAF- to follow up with PCP- to see if she is a candiate for anticoags.  Not started at times time due to alcohol abuse.  [name] GERD (gastroesophageal reflux disease)   . H/O hiatal hernia   . History of acute cholangitis   . History of GI diverticular bleed   . Hyperlipidemia   . Hypertension   . Hypothyroidism   . IBS (irritable bowel syndrome)   . Pancreatitis   . Peripheral vascular disease ([place])   . PONV (postoperative nausea and vomiting)    with Breast reduction- only time  . Rosacea   . Seizures ([place]) 11/13/2016   Thopught to be from withdrawl Benzodiazepine  . Stroke [facility])    patient denies-  . Thyroid disease    Past Surgical History:       Past Surgical History:  Procedure Laterality Date  . ABDOMINAL HYSTERECTOMY     partial  . BACK SURGERY  2010   thoractic-screws and rods  . BILE DUCT STENT PLACEMENT  08/26/2014  . BREAST REDUCTION SURGERY    . CHOLECYSTECTOMY  1988   lap  . COLONOSCOPY    .  COLONOSCOPY Left 09/08/2014   Procedure: COLONOSCOPY;  Surgeon: Arta Silence, MD;  Location: [facility] ENDOSCOPY;  Service: Endoscopy;  Laterality: Left;  . ENDARTERECTOMY Left 12/28/2016  . ENDARTERECTOMY Left 12/28/2016   Procedure: ENDARTERECTOMY CAROTID LEFT WITH XENOSURE BIOLOGIC PATCH ANGIOPLASTY;  Surgeon: Angelia Mould, MD;  Location: [facility];  Service: Vascular;  Laterality: Left;  . ERCP    . ERCP N/A 08/26/2014   Procedure: ENDOSCOPIC RETROGRADE CHOLANGIOPANCREATOGRAPHY (ERCP);  Surgeon: Jeryl Columbia, MD;  Location: [facility] ENDOSCOPY;  Service: Endoscopy;  Laterality: N/A;  . ERCP N/A 09/11/2014    Procedure: ENDOSCOPIC RETROGRADE CHOLANGIOPANCREATOGRAPHY (ERCP);  Surgeon: Arta Silence, MD;  Location: [facility] ENDOSCOPY;  Service: Endoscopy;  Laterality: N/A;  . EUS N/A 09/11/2014   Procedure: ESOPHAGEAL ENDOSCOPIC ULTRASOUND (EUS) RADIAL;  Surgeon: Arta Silence, MD;  Location: [facility] ENDOSCOPY;  Service: Endoscopy;  Laterality: N/A;  . EYE SURGERY Bilateral    Cataract  . JOINT REPLACEMENT  2005   left shoulder replacement   . ORIF CLAVICULAR FRACTURE Left 07/18/2014   Procedure: OPEN REDUCTION INTERNAL FIXATION (ORIF) LEFT CLAVICULAR FRACTURE;  Surgeon: Ninetta Lights, MD;  Location: [facility];  Service: Orthopedics;  Laterality: Left;  . SPYGLASS CHOLANGIOSCOPY N/A 09/11/2014   Procedure: SPYGLASS CHOLANGIOSCOPY;  Surgeon: Arta Silence, MD;  Location: [facility] ENDOSCOPY;  Service: Endoscopy;  Laterality: N/A;  . TONSILLECTOMY  as child  . TOTAL KNEE ARTHROPLASTY Left 03/10/2016   Procedure: LEFT TOTAL KNEE ARTHROPLASTY;  Surgeon: Ninetta Lights, MD;  Location: [facility];  Service: Orthopedics;  Laterality: Left;  [name] VENTRAL HERNIA REPAIR  06/08/2012   Procedure: LAPAROSCOPIC VENTRAL HERNIA;  Surgeon: Adin Hector, MD;  Location: [facility] ORS;  Service: General;  Laterality: Right;    Assessment & Plan Clinical Impression:  [name] [name] is an 83 year old female with history of HTN, PAF--no AC due to falls, psoriatic arthritis, anxiety/depression, chronic diastolic CHF, who was admitted on 03/09/19 after falling in the middle of the night and laid on the tile floor till family was able to get help next morning. Per reports, has had increased in falls with worsening of tremors and weakness --has been spending most of the day in bed for past 2 weeks. She was found to be hypothermic with temp 93 degrees, was confused with strong odor of urine. She was warmed by bear hugger, started on IVF for AKI with evidence of rhabdomyolysis as well as antibiotics empirically due to concerns of  sepsis. Urine/CXR negative for infectious work up and IV antibiotics transitioned to Augmentin due to concerns of aspiration PNA. 1/4 BC positive for gram negative rods and felt to be contaminant.   On CIWA protocol due to concerns of alcohol abuse---home dose librium increased to 10 mg tid. She has had issues with A fib with RVR treated with Cardizem drip. Rectal pouch placed due to diarrhea.Fluid overload treated with dose of lasix today.Therapy ongoing and patient noted to be debilitated with difficulty standing or performing ADLs as well as problems processing with STM deficits. CIR recommended due to functional deficits. Patient transferred to CIR on 03/13/2019 .   Pt presents with decreased activity tolerance, decreased functional mobility, decreased balance, decreased awareness, decreased problem solving, decreased safety awareness and decreased memory Limiting pt's independence with leisure/community pursuits.   Plan Min 1 TR session >20 minutes during LOS  Recommendations for other services: Neuropsych  Discharge Criteria: Patient will be discharged from TR if patient refuses treatment 3 consecutive times without medical reason.  If treatment goals not met, if there is a change in medical status, if patient makes no progress towards goals or if patient is discharged from hospital.  The above assessment, treatment plan, treatment alternatives and goals were discussed and mutually agreed upon: by patient  [place] 03/15/2019, 12:37 PM

## 2019-03-15 NOTE — Progress Notes (Signed)
Occupational Therapy Session Note  Patient Details  Name: [name] [name] MRN: [id number] Date of Birth: 1936-08-19  Today's Date: 03/15/2019 OT Individual Time: 1515-1600 OT Individual Time Calculation (min): 45 min    Short Term Goals: Week 1:  OT Short Term Goal 1 (Week 1): Pt will complete bathing with min assist to demonstrate increased activity tolerance OT Short Term Goal 2 (Week 1): Pt will complete LB dressing (except footwear) with Min assist OT Short Term Goal 3 (Week 1): Pt will complete 2 grooming tasks in standing with CGA to demonstrate increased activity tolerance OT Short Term Goal 4 (Week 1): Pt will complete toilet transfer with min assist with LRAD  Skilled Therapeutic Interventions/Progress Updates:    1:1. Pt received in w/c with no c/o pain. Pt completes ambulation with RW with MIN A to toilet in bathroom with min VC for RW management and reaching backwards to grabar when sitting. Pt requires increased time to void bladder. Pt completes standing hand washing and standing organization of dresser drawers to sort belongings brought by family. Pt requesting to complete UB exercises. Guided pt with demonstration of 5# UB dowel rod circuit for UB strenthening required for BADLs. Exited session with pt seated in w/c, call light in reach and all need smet  Therapy Documentation Precautions:  Precautions Precautions: Fall Restrictions Weight Bearing Restrictions: No General:   Vital Signs: Therapy Vitals Temp: 98.6 F (37 C) Pulse Rate: 64 Resp: 18 BP: (!) 152/63 Patient Position (if appropriate): Sitting Oxygen Therapy SpO2: 98 % O2 Device: Room Air Pain:   ADL: ADL Grooming: Minimal assistance Where Assessed-Grooming: Standing at sink Upper Body Bathing: Minimal assistance Where Assessed-Upper Body Bathing: Standing at sink Lower Body Bathing: Dependent Where Assessed-Lower Body Bathing: Sitting at sink, Standing at sink Upper Body Dressing: Moderate  assistance Where Assessed-Upper Body Dressing: Sitting at sink Lower Body Dressing: Dependent Where Assessed-Lower Body Dressing: Sitting at sink, Standing at sink Toileting: Not assessed Toilet Transfer: Minimal assistance Toilet Transfer Method: Stand pivot Toilet Transfer Equipment: Grab bars Vision   Perception    Praxis   Exercises:   Other Treatments:     Therapy/Group: Individual Therapy  [place]ny [place] 03/15/2019, 4:10 PM

## 2019-03-16 ENCOUNTER — Inpatient Hospital Stay (HOSPITAL_COMMUNITY): Payer: Self-pay | Admitting: Speech Pathology

## 2019-03-16 ENCOUNTER — Inpatient Hospital Stay (HOSPITAL_COMMUNITY): Payer: Self-pay | Admitting: Physical Therapy

## 2019-03-16 ENCOUNTER — Inpatient Hospital Stay (HOSPITAL_COMMUNITY): Payer: Self-pay | Admitting: Occupational Therapy

## 2019-03-16 ENCOUNTER — Inpatient Hospital Stay (HOSPITAL_COMMUNITY): Payer: Self-pay

## 2019-03-16 LAB — BASIC METABOLIC PANEL
Anion gap: 6 (ref 5–15)
BUN: 23 mg/dL (ref 8–23)
CO2: 22 mmol/L (ref 22–32)
Calcium: 8.7 mg/dL — ABNORMAL LOW (ref 8.9–10.3)
Chloride: 113 mmol/L — ABNORMAL HIGH (ref 98–111)
Creatinine, Ser: 1.23 mg/dL — ABNORMAL HIGH (ref 0.44–1.00)
GFR calc Af Amer: 48 mL/min — ABNORMAL LOW (ref >60)
GFR calc non Af Amer: 41 mL/min — ABNORMAL LOW (ref >60)
Glucose, Bld: 113 mg/dL — ABNORMAL HIGH (ref 70–99)
Potassium: 3.8 mmol/L (ref 3.5–5.1)
Sodium: 141 mmol/L (ref 135–145)

## 2019-03-16 MED ORDER — SORBITOL 70 % SOLN: 60.0000 mL | Status: AC

## 2019-03-16 NOTE — Progress Notes (Signed)
Speech Language Pathology Daily Session Note  Patient Details  Name: [name] [name] MRN: [id number] Date of Birth: 1936-08-25  Today's Date: 03/16/2019 SLP Individual Time: 0762-2633 SLP Individual Time Calculation (min): 66 min  Short Term Goals: Week 1: SLP Short Term Goal 1 (Week 1): Pt will complete mildly complex tasks with supervision cues for functional problem solving.   SLP Short Term Goal 2 (Week 1): Pt will recall complex, daily information with supervision cues for use of external aids.   SLP Short Term Goal 3 (Week 1): Pt will anticipate barriers to discharge and generate appropriate solutions with supervision cues.    Skilled Therapeutic Interventions:  Pt was seen for skilled ST targeting cognitive goals. Pt resting in bed upon therapist's arrival but awakened easily and was motivated to participate in therapy.  Pt states that she had a very good day in therapy yesterday and was very pleased with what she was able to do with PT and OT.  Pt also states that she feels her thinking is coming back but not quite back to baseline.  SLP facilitated the session with ongoing medication management tasks to address problem solving goals.  Pt needed min assist verbal cues to recognize and correct errors when organizing medications into a 4x daily pill box.  Pt's errors often occurred as a result of pt becoming distracted by conversations with therapist; however, pt independently demonstrated good insight into task difficulty, stating "I should be quiet while doing this."  SLP recommended that pt use a pill box and have assistance for medication management at discharge.  Pt was in agreement and stated that her daughter would be willing to help her at home.  Pt was returned to room and left in wheelchair with chair alarm set and call bell within reach.  Continue per current plan of car.e    Pain Pain Assessment Pain Scale: Faces Faces Pain Scale: Hurts little more Pain Type: Chronic pain Pain  Location: Hand Pain Orientation: Right Pain Descriptors / Indicators: Aching Pain Frequency: Intermittent Pain Intervention(s): RN made aware  Therapy/Group: Individual Therapy  [name] [name], Selinda Orion 03/16/2019, 12:19 PM

## 2019-03-16 NOTE — IPOC Note (Signed)
Overall Plan of Care [facility]) Patient Details Name: [name] [name] MRN: [id number] DOB: March 13, 1936  Admitting Diagnosis: <principal problem not specified>  Hospital Problems: Active Problems:   Debility     Functional Problem List: Nursing Bowel, Edema, Endurance, Medication Management, Pain, Safety, Skin Integrity  PT Balance, Endurance, Safety  OT Balance, Cognition, Endurance, Motor, Pain, Safety  SLP Cognition  TR         Basic ADL's: OT Grooming, Bathing, Dressing, Toileting     Advanced  ADL's: OT Simple Meal Preparation     Transfers: PT Bed Mobility, Bed to Chair, Car, Furniture, Floor  OT Toilet, Metallurgist: PT Ambulation, Emergency planning/management officer, Stairs     Additional Impairments: OT None  SLP Social Cognition   Problem Solving, Memory, Awareness  TR      Anticipated Outcomes Item Anticipated Outcome  Self Feeding Mod I  Swallowing      Basic self-care  Supervision  Toileting  Supervision   Bathroom Transfers Supervision  Bowel/Bladder  Pt will manage bowel and bladder with mod assist at discharge   Transfers  Supervision  Locomotion  Supervision with LRAD  Communication     Cognition  supervision  Pain  Pt will manage pain at 3 or less on a scale of 0-10.  Safety/Judgment  Pt will remain free of falls and injury while in rehab with min assist/cues.    Therapy Plan: PT Intensity: Minimum of 1-2 x/day ,45 to 90 minutes PT Frequency: 5 out of 7 days PT Duration Estimated Length of Stay: 14-16 days OT Intensity: Minimum of 1-2 x/day, 45 to 90 minutes OT Frequency: 5 out of 7 days OT Duration/Estimated Length of Stay: 14-16 days SLP Intensity: Minumum of 1-2 x/day, 30 to 90 minutes SLP Frequency: 3 to 5 out of 7 days SLP Duration/Estimated Length of Stay: 14-17 days     Team Interventions: Nursing Interventions Patient/Family Education, Bowel Management, Disease Management/Prevention, Medication Management, Pain Management,  Skin Care/Wound Management, Discharge Planning  PT interventions Ambulation/gait training, Balance/vestibular training, Community reintegration, Discharge planning, Disease management/prevention, DME/adaptive equipment instruction, Functional mobility training, Patient/family education, Psychosocial support, Therapeutic Activities, Therapeutic Exercise, UE/LE Strength taining/ROM, UE/LE Coordination activities, Wheelchair propulsion/positioning  OT Interventions Training and development officer, Cognitive remediation/compensation, Academic librarian, Discharge planning, Disease mangement/prevention, Engineer, drilling, Functional mobility training, Pain management, Patient/family education, Psychosocial support, Self Care/advanced ADL retraining, Skin care/wound managment, Therapeutic Activities, Therapeutic Exercise, Splinting/orthotics, UE/LE Strength taining/ROM  SLP Interventions Cognitive remediation/compensation, Cueing hierarchy, Internal/external aids, Patient/family education, Functional tasks  TR Interventions    SW/CM Interventions Discharge Planning, Psychosocial Support, Patient/Family Education   Barriers to Discharge MD  Medical stability  Nursing      PT Medical stability, Home environment access/layout    OT      SLP      SW       Team Discharge Planning: Destination: PT-Home ,OT- Home , SLP-Home Projected Follow-up: PT-Home health PT, OT-  Home health OT, SLP-24 hour supervision/assistance Projected Equipment Needs: PT-Rolling walker with 5" wheels, To be determined, OT- Tub/shower bench, To be determined, SLP-None recommended by SLP Equipment Details: PT-pt owns RW, other equipment TBD pending progress, OT-  Patient/family involved in discharge planning: PT- Patient,  OT-Patient, SLP-Patient  MD ELOS: 14-17 days Medical Rehab Prognosis:  Excellent Assessment: The patient has been admitted for CIR therapies with the diagnosis of debility after  rhabdomyolysis, multiple medical. The team will be addressing functional mobility, strength, stamina, balance, safety, adaptive techniques and equipment,  self-care, bowel and bladder mgt, patient and caregiver education, NMR, pain mgt, cognitive remediation, activity tolerance. Goals have been set at supervision for self-care, mobility, and cognition.    Meredith Staggers, MD, FAAPMR      See Team Conference Notes for weekly updates to the plan of care

## 2019-03-16 NOTE — Progress Notes (Signed)
Patient says her last bowel movement was when she still had flexiseal in place, 4/1. States she feels her bowels ready to move soon but is concerned about taking sorbitol at this time. Will take tomorrow if no BM today.

## 2019-03-16 NOTE — Care Management (Signed)
Inpatient [facility] Individual Statement of Services  Patient Name:  [name] [name]  Date:  03/16/2019  Welcome to the [facility].  Our goal is to provide you with an individualized program based on your diagnosis and situation, designed to meet your specific needs.  With this comprehensive rehabilitation program, you will be expected to participate in at least 3 hours of rehabilitation therapies Monday-Friday, with modified therapy programming on the weekends.  Your rehabilitation program will include the following services:  Physical Therapy (PT), Occupational Therapy (OT), Speech Therapy (ST), 24 hour per day rehabilitation nursing, Therapeutic Recreaction (TR), Neuropsychology, Case Management (Social Worker), Rehabilitation Medicine, Nutrition Services and Pharmacy Services  Weekly team conferences will be held on Tuesdays to discuss your progress.  Your Social Worker will talk with you frequently to get your input and to update you on team discussions.  Team conferences with you and your family in attendance may also be held.  Expected length of stay: 14-17 days   Overall anticipated outcome: supervision  Depending on your progress and recovery, your program may change. Your Social Worker will coordinate services and will keep you informed of any changes. Your Social Worker's name and contact numbers are listed  below.  The following services may also be recommended but are not provided by the [facility] will be made to provide these services after discharge if needed.  Arrangements include referral to agencies that provide these services.  Your insurance has been verified to be:  Medicare and [employer] Your primary doctor is:  [facility]  Pertinent information will be shared with your doctor and your  insurance company.  Social Worker:  [place], [place] or [phone]   Information discussed with and copy given to patient by: Lennart Pall, 03/16/2019, 4:55 PM

## 2019-03-16 NOTE — Progress Notes (Signed)
Social Work  Social Work Assessment and Plan  Patient Details  Name: [name] [name] MRN: [id number] Date of Birth: 01-Aug-1936  Today's Date: 03/16/2019  Problem List:  Patient Active Problem List   Diagnosis Date Noted  . Debility 03/13/2019  . Hypothermia 03/09/2019  . Elevated liver enzymes 03/09/2019  . Thrombocytopenia ([place]) 03/09/2019  . Hyperkalemia 07/23/2017  . AKI (acute kidney injury) ([place]) 07/23/2017  . 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 Fall 06/26/2017  . Intractable nausea and vomiting 03/11/2017  . Acute lower UTI 03/11/2017  . Carotid stenosis 12/28/2016  . Sedative, hypnotic, or anxiolytic withdrawal delirium 11/15/2016  . Paroxysmal atrial fibrillation (HCC)   . Cerebrovascular disease   . Fever   . Acute encephalopathy 11/10/2016  . Hypothyroidism 11/10/2016  . Hypertensive urgency 11/10/2016  . Altered mental state 11/10/2016  . Seizure ([place]) 11/10/2016  . Chronic diastolic CHF (congestive heart failure) ([place])   . Left carotid stenosis 10/29/2016  . S/P total knee replacement 03/10/2016  . 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 Ileus ([place]) 09/05/2014  . Leukocytosis 09/05/2014  . Vomiting 09/04/2014  . Pulmonary edema 09/04/2014  . Hypokalemia 08/28/2014  . Enteritis due to Clostridium difficile 08/27/2014  . 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 Protein calorie malnutrition ([place]) 08/24/2014  . Obstructive jaundice 08/23/2014  . Acute cholangitis 08/23/2014  . ARF (acute renal failure) ([place]) 08/23/2014  . Coagulopathy ([place]) 08/23/2014  . Back pain   . Arthritis   . Hypertension   . GERD (gastroesophageal reflux disease)   . Anxiety   . IBS (irritable bowel syndrome)   . Hernia of Right posterior flank 04/18/2012   Past Medical History:  Past Medical History:  Diagnosis Date  . Alopecia   . Anemia    hx of years ago   . Anxiety   . ARF (acute renal failure) ([place]) 08/23/2014  . Arthritis     psoriatic arthritis  . Back pain   . Blood transfusion    at age of 4  . Carotid stenosis 12/2016  . Chronic diastolic CHF (congestive heart failure) ([place])   . Depression   . Dysrhythmia 11/13/2016   PAFib for a short time- Echo done 11/14/16 PAF- to follow up with PCP- to see if she is a candiate for anticoags.  Not started at times time due to alcohol abuse.  [name] GERD (gastroesophageal reflux disease)   . H/O hiatal hernia   . History of acute cholangitis   . History of GI diverticular bleed   . Hyperlipidemia   . Hypertension   . Hypothyroidism   . IBS (irritable bowel syndrome)   . Pancreatitis   . Peripheral vascular disease ([place])   . PONV (postoperative nausea and vomiting)    with Breast reduction- only time  . Rosacea   . Seizures ([place]) 11/13/2016   Thopught to be from withdrawl Benzodiazepine  . Stroke [facility])    patient denies-  . Thyroid disease    Past Surgical History:  Past Surgical History:  Procedure Laterality Date  . ABDOMINAL HYSTERECTOMY     partial  . BACK SURGERY  2010   thoractic-screws and rods  . BILE DUCT STENT PLACEMENT  08/26/2014  . BREAST REDUCTION SURGERY    . CHOLECYSTECTOMY  1988   lap  . COLONOSCOPY    . COLONOSCOPY Left 09/08/2014   Procedure: COLONOSCOPY;  Surgeon: Arta Silence, MD;  Location[facility]COPY;  Service: Endoscopy;  Laterality: Left;  . ENDARTERECTOMY Left 12/28/2016  . ENDARTERECTOMY Left 12/28/2016   Procedure: ENDARTERECTOMY CAROTID LEFT WITH XENOSURE BIOLOGIC PATCH ANGIOPLASTY;  Surgeon: Angelia Mould, MD;  Location: [facility];  Service: Vascular;  Laterality: Left;  . ERCP    . ERCP N/A 08/26/2014   Procedure: ENDOSCOPIC RETROGRADE CHOLANGIOPANCREATOGRAPHY (ERCP);  Surgeon: Jeryl Columbia, MD;  Location: [facility] ENDOSCOPY;  Service: Endoscopy;  Laterality: N/A;  . ERCP N/A 09/11/2014   Procedure: ENDOSCOPIC RETROGRADE CHOLANGIOPANCREATOGRAPHY (ERCP);  Surgeon: Arta Silence, MD;  Location: [facility] ENDOSCOPY;  Service:  Endoscopy;  Laterality: N/A;  . EUS N/A 09/11/2014   Procedure: ESOPHAGEAL ENDOSCOPIC ULTRASOUND (EUS) RADIAL;  Surgeon: Arta Silence, MD;  Location: [facility] ENDOSCOPY;  Service: Endoscopy;  Laterality: N/A;  . EYE SURGERY Bilateral    Cataract  . JOINT REPLACEMENT  2005   left shoulder replacement   . ORIF CLAVICULAR FRACTURE Left 07/18/2014   Procedure: OPEN REDUCTION INTERNAL FIXATION (ORIF) LEFT CLAVICULAR FRACTURE;  Surgeon: Ninetta Lights, MD;  Location: [facility];  Service: Orthopedics;  Laterality: Left;  . SPYGLASS CHOLANGIOSCOPY N/A 09/11/2014   Procedure: SPYGLASS CHOLANGIOSCOPY;  Surgeon: Arta Silence, MD;  Location: [facility] ENDOSCOPY;  Service: Endoscopy;  Laterality: N/A;  . TONSILLECTOMY  as child  . TOTAL KNEE ARTHROPLASTY Left 03/10/2016   Procedure: LEFT TOTAL KNEE ARTHROPLASTY;  Surgeon: Ninetta Lights, MD;  Location: [facility];  Service: Orthopedics;  Laterality: Left;  [name] VENTRAL HERNIA REPAIR  06/08/2012   Procedure: LAPAROSCOPIC VENTRAL HERNIA;  Surgeon: Adin Hector, MD;  Location: [facility] ORS;  Service: General;  Laterality: Right;   Social History:  reports that she has never smoked. She has never used smokeless tobacco. She reports current alcohol use of about 7.0 standard drinks of alcohol per week. She reports that she does not use drugs.  Family / Support Systems Marital Status: Widow/Widower How Long?: 2019-01-02 Patient Roles: Parent Children: daughter, [name] [name] [facility]) @ [phone] Anticipated Caregiver: daughter Ability/Limitations of Caregiver: daughter unemployed; was lving in own apartment but with pt over past few months Caregiver Availability: 24/7 Family Dynamics: Pt speaks very affectionately about her daughter and the support she has given to her especially since her spouse died.  Daughter tearful as she states, "we've been through a lot over the past few months."  Daughter committed to providing needed asssit to pt upon  d/c.  Social History Preferred language: English Religion: Christian Cultural Background: NA Read: Yes Write: Yes Employment Status: Retired Public relations account executive Issues: None Guardian/Conservator: None - per MD, pt is not capable of making decisions on her own behalf - defer to daughter.   Abuse/Neglect Abuse/Neglect Assessment Can Be Completed: Yes Physical Abuse: Denies Verbal Abuse: Denies Sexual Abuse: Denies Exploitation of patient/patient's resources: Denies Self-Neglect: Denies  Emotional Status Pt's affect, behavior and adjustment status: Pt very pleasant and able to complete assessment interview without much difficulty.  She talks openly with me about her alcohol use/ abuse which he notes worsened when her spouse died.  She has poor understanding of medical issues which led to this hospitalization.  Per MD, will refer pt for neuropsychology for additional support. Recent Psychosocial Issues: Spouse died 2019-01-02 Psychiatric History: Pt reports she has "battled depression for years"  and is followed by Dr. Toy Care and has q6 month visits for med management. Substance Abuse History: Per chart review, it is noted that pt has long h/o issues with ETOH abuse.  Pt confirms that she has struggled with this  and her spouse "always wanted me to stop."  She does note, however, that she has never discussed this with her psychiatrist, "I wouldn't want her to know about that."    Patient / Family Perceptions, Expectations & Goals Pt/Family understanding of illness & functional limitations: Pt states, "all I know is that I fell.   I was anemic and dehydrated."  Daughter with general understanding of her medical issues and current functional limitations / need for CIR. Premorbid pt/family roles/activities: Pt and daughter both confirm that pt's mobility significantly decreased with the death of her spouse and daughter states, " I'm not equipped to keep doing some of the things she needed  right before she came in." Anticipated changes in roles/activities/participation: Daughter was providing physical care to mother PTA and would anticipate that this will still be needed to some degress at d/c. Pt/family expectations/goals: "I just hope I can get around better."  (pt)  Daughter hopeful she will not require as much physical assist as she did PTA.  Community Resources Express Scripts: None Premorbid Home Care/DME Agencies: None Transportation available at discharge: yes Resource referrals recommended: Neuropsychology  Discharge Planning Living Arrangements: Children Support Systems: Children, Friends/neighbors Type of Residence: Private residence Insurance underwriter Resources: [employer], Multimedia programmer (specify)([employer]) Museum/gallery curator Resources: Radio broadcast assistant Screen Referred: No Living Expenses: Mortgage Does the patient have any problems obtaining your medications?: No Home Management: Mostly daughter PTA Patient/Family Preliminary Plans: Pt to return to her home with daughter to stay and provide 24/7 assistance. Social Work Anticipated Follow Up Needs: HH/OP Expected length of stay: 14-17 days  Clinical Impression Unfortunate, elderly woman here following a fall at home and on the floor for several hours prior to admission.  With know h/o ETOH abuse and multiple chronic health issues as well as recent death of her spouse.  Has only one daughter who has been staying with pt to assist since her emotional and physical decline with spouse's death and notes the demand was increasing daily.  Both hopeful she can regain some level of independence but daughter is prepared to continue to provide 24/7 support.  Will follow for support and d/c planning.  Ugochi Henzler 03/16/2019, 4:52 PM

## 2019-03-16 NOTE — Progress Notes (Signed)
Occupational Therapy Session Note  Patient Details  Name: [name] [name] MRN: [id number] Date of Birth: Mar 24, 1936  Today's Date: 03/16/2019 OT Individual Time: 6213-0865 OT Individual Time Calculation (min): 31 min   Short Term Goals: Week 1:  OT Short Term Goal 1 (Week 1): Pt will complete bathing with min assist to demonstrate increased activity tolerance OT Short Term Goal 2 (Week 1): Pt will complete LB dressing (except footwear) with Min assist OT Short Term Goal 3 (Week 1): Pt will complete 2 grooming tasks in standing with CGA to demonstrate increased activity tolerance OT Short Term Goal 4 (Week 1): Pt will complete toilet transfer with min assist with LRAD  Skilled Therapeutic Interventions/Progress Updates:    Pt greeted in w/c, in high spirits due to showering this AM. ADL needs met. To work on [employer], guided pt through B UE therex using orange tband x11 reps.  Also instructed her on techniques for UB stretching prior with emphasis placed on coordinating breath with movement. At end of session pt was set up for lunch and left with all needs within reach, safety belt fastened.   Therapy Documentation Precautions:  Precautions Precautions: Fall Restrictions Weight Bearing Restrictions: No Vital Signs: Therapy Vitals Pulse Rate: 61 Resp: 16 BP: (!) 152/64 Patient Position (if appropriate): Sitting Oxygen Therapy SpO2: 97 % Pain: Arthritic pain. Pt said she had just taken a tylenol from RN before OT arrived Pain Assessment Pain Scale: Faces Faces Pain Scale: Hurts little more Pain Type: Chronic pain Pain Location: Hand Pain Orientation: Right Pain Descriptors / Indicators: Aching Pain Intervention(s): RN made aware ADL: ADL Grooming: Minimal assistance Where Assessed-Grooming: Standing at sink Upper Body Bathing: Minimal assistance Where Assessed-Upper Body Bathing: Standing at sink Lower Body Bathing: Dependent Where Assessed-Lower Body Bathing: Sitting  at sink, Standing at sink Upper Body Dressing: Moderate assistance Where Assessed-Upper Body Dressing: Sitting at sink Lower Body Dressing: Dependent Where Assessed-Lower Body Dressing: Sitting at sink, Standing at sink Toileting: Not assessed Toilet Transfer: Minimal assistance Toilet Transfer Method: Stand pivot Toilet Transfer Equipment: Grab bars :     Therapy/Group: Individual Therapy  Shamell Hittle A Yesha Muchow 03/16/2019, 4:06 PM

## 2019-03-16 NOTE — Progress Notes (Signed)
[facility] PHYSICAL MEDICINE & REHABILITATION PROGRESS NOTE   Subjective/Complaints: Happy with therapy. Walking made her feel good, feels that she's getting stronger.   ROS: Patient denies fever, rash, sore throat, blurred vision, nausea, vomiting, diarrhea, cough, shortness of breath or chest pain, joint or back pain, headache, or mood change.     Objective:   No results found. Recent Labs    03/14/19 0600  WBC 7.3  HGB 10.6*  HCT 34.8*  PLT 115*   Recent Labs    03/14/19 0600 03/16/19 0531  NA 140 141  K 3.6 3.8  CL 111 113*  CO2 18* 22  GLUCOSE 113* 113*  BUN 23 23  CREATININE 1.36* 1.23*  CALCIUM 8.3* 8.7*    Intake/Output Summary (Last 24 hours) at 03/16/2019 1038 Last data filed at 03/16/2019 0900 Gross per 24 hour  Intake 720 ml  Output -  Net 720 ml     Physical Exam: Vital Signs Blood pressure (!) 168/64, pulse 63, temperature (!) 97.5 F (36.4 C), temperature source Oral, resp. rate 15, height 5\' 1"  (1.549 m), weight 80.2 kg, SpO2 94 %. Constitutional: No distress . Vital signs reviewed. HEENT: EOMI, oral membranes moist, periorbital ecchymoses Neck: supple Cardiovascular: RRR without murmur. No JVD    Respiratory: CTA Bilaterally without wheezes or rales. Normal effort    GI: BS +, non-tender, non-distended  Musculoskeletal:  General: Edema(bilateral ankles/feet)1+.  Comments:   Right hand and wrist tender with ulnar deviation of fingers. Neurological: She is alertand oriented to person, place, and time.tangential, distracted sometimes.   Appropriate language, functional memory. RUE 3/5. LUE 4/5. Bilateral LE 2+/5 HF, 3/5 KE and 3+/5 ADF/PF. Senses pain in all 4's. Skin: Skin iswarmand dry. Left TKA scar.persistenterythema RUE. Psychiatric:  Pleasant and cooperative    Assessment/Plan: 1. Functional deficits secondary to rhabdomyolysis, debility which require 3+ hours per day of interdisciplinary therapy in a comprehensive  inpatient rehab setting.  Physiatrist is providing close team supervision and 24 hour management of active medical problems listed below.  Physiatrist and rehab team continue to assess barriers to discharge/monitor patient progress toward functional and medical goals  Care Tool:  Bathing    Body parts bathed by patient: Right arm, Left arm, Chest, Abdomen, Face   Body parts bathed by helper: Front perineal area, Buttocks, Right upper leg, Left upper leg, Right lower leg, Left lower leg     Bathing assist Assist Level: Maximal Assistance - Patient 24 - 49%     Upper Body Dressing/Undressing Upper body dressing   What is the patient wearing?: Pull over shirt    Upper body assist Assist Level: Moderate Assistance - Patient 50 - 74%    Lower Body Dressing/Undressing Lower body dressing      What is the patient wearing?: Pants     Lower body assist Assist for lower body dressing: Total Assistance - Patient < 25%     Toileting Toileting    Toileting assist Assist for toileting: Moderate Assistance - Patient 50 - 74%     Transfers Chair/bed transfer  Transfers assist  Chair/bed transfer activity did not occur: Safety/medical concerns  Chair/bed transfer assist level: Minimal Assistance - Patient > 75%     Locomotion Ambulation   Ambulation assist      Assist level: Minimal Assistance - Patient > 75% Assistive device: Walker-rolling Max distance: 26'   Walk 10 feet activity   Assist     Assist level: Minimal Assistance - Patient > 75% Assistive  device: Walker-rolling   Walk 50 feet activity   Assist Walk 50 feet with 2 turns activity did not occur: Safety/medical concerns  Assist level: Minimal Assistance - Patient > 75% Assistive device: Walker-rolling    Walk 150 feet activity   Assist Walk 150 feet activity did not occur: Safety/medical concerns         Walk 10 feet on uneven surface  activity   Assist Walk 10 feet on uneven  surfaces activity did not occur: Safety/medical concerns         Wheelchair     Assist Will patient use wheelchair at discharge?: No             Wheelchair 50 feet with 2 turns activity    Assist            Wheelchair 150 feet activity     Assist          Medical Problem List and Plan: 1.Functional mobility deficitssecondary to rhabdomyolysis, pneumonia, ETOH abuse. --Continue CIR therapies including PT, OT, and SLP   2. Antithrombotics: -DVT/anticoagulation:Pharmaceutical:Lovenox -antiplatelet therapy: ASA 3. Pain Management:tylenol prn. 4. Mood:team providing ego support  -have asked Pschology to see also  -baseline anxiety present but seems to be managing fairly well at present -antipsychotic agents:  Seroquel and librium(used 10 mg bid for anxiety at home). 5. Neuropsych: This patientis notcapable of making decisions on herown behalf. 6. Skin/Wound Care:Routine pressure relief measures. 7. Fluids/Electrolytes/Nutrition:encourage PO, eating well as of late  -BUN sl elevated--push fluids 8. Aspiration PNA?:Day 3 antibiotics--transition toAugment fora week ofantibiotic course. 9. AKI with mild rhabdomyolysis:    -pushing fluids  - Cr stable/decr to 1.23 4/3 10.Chronic diastolic CHF: NEED daily weights  -stable at present [facility] Weights   03/13/19 1800 03/14/19 0512  Weight: 80 kg 80.2 kg    11. H/o anxiety/depression: Lost husband 12/19.Managed by Dr. Vilma Meckel and librium. 12. Abnormal LFTs:normal 4/1 13. Fall risk: Continue high low bed.  14. Diarrhea: Continue rectal tube as is incontinent of bowel and has irritatedskin  -continue probiotic  -no bm for several days  -added senna scheduled at bedtime  -sorbitol today, 60cc today  -sorbitol prn    LOS: 3 days A FACE TO FACE EVALUATION WAS PERFORMED  Meredith Staggers 03/16/2019, 10:38 AM

## 2019-03-16 NOTE — Progress Notes (Signed)
Occupational Therapy Session Note  Patient Details  Name: [name] [name] MRN: [id number] Date of Birth: 06/09/1936  Today's Date: 03/16/2019 OT Individual Time: 1030-1130 OT Individual Time Calculation (min): 60 min    Short Term Goals: Week 1:  OT Short Term Goal 1 (Week 1): Pt will complete bathing with min assist to demonstrate increased activity tolerance OT Short Term Goal 2 (Week 1): Pt will complete LB dressing (except footwear) with Min assist OT Short Term Goal 3 (Week 1): Pt will complete 2 grooming tasks in standing with CGA to demonstrate increased activity tolerance OT Short Term Goal 4 (Week 1): Pt will complete toilet transfer with min assist with LRAD  Skilled Therapeutic Interventions/Progress Updates:    1:1. Pt received in w/c reporting need to toilet. tp completes ambulation throughout session with MIN A and VC for larger step through steps. Pt completes bladder void on toilet with A for clothing management. Pt completes bathing sit to stand from shower level with A to wash B feet and steady A to wash buttocks. Pt completes dressing with max A for UB button up shirt and MAX A for LB dressing underwear, pants and socks. Pt able to advance  Pants past hips with min A for balance and OT instructs in reacher use to thread BLE into pants with min A. Pt stands with min A at sink while brushing your teeth. Exited session with pt seated in w/c,c all light in reach and belt alarm on.  Therapy Documentation Precautions:  Precautions Precautions: Fall Restrictions Weight Bearing Restrictions: No   Therapy/Group: Individual Therapy  Tonny Branch 03/16/2019, 11:33 AM

## 2019-03-16 NOTE — Progress Notes (Signed)
Physical Therapy Session Note  Patient Details  Name: [name] [name] MRN: [id number] Date of Birth: 05/24/1936  Today's Date: 03/16/2019 PT Individual Time: 1430-1530 PT Individual Time Calculation (min): 60 min   Short Term Goals: Week 1:  PT Short Term Goal 1 (Week 1): Pt will complete least restrictive transfers with min A consistently PT Short Term Goal 2 (Week 1): Pt will ambulate x 25 ft with LRAD and min A  Skilled Therapeutic Interventions/Progress Updates:    Pt received seated in w/c in room, agreeable to PT session. No complaints of pain. Toilet transfer with min A with use of RW, min A for clothing management. Sit to stand with CGA to RW. Ambulation 2 x 160 ft with RW and CGA. Pt exhibits improved tolerance for gait this date. Nustep level 3 x 10 min with use of B UE/LE for global endurance training. Pt left seated in w/c in room with needs in reach, quick release belt and chair alarm in place at end of session.  Therapy Documentation Precautions:  Precautions Precautions: Fall Restrictions Weight Bearing Restrictions: No    Therapy/Group: Individual Therapy   Excell Seltzer, PT, DPT  03/16/2019, 3:51 PM

## 2019-03-17 ENCOUNTER — Inpatient Hospital Stay (HOSPITAL_COMMUNITY): Payer: Self-pay | Admitting: Occupational Therapy

## 2019-03-17 ENCOUNTER — Inpatient Hospital Stay (HOSPITAL_COMMUNITY): Payer: Self-pay | Admitting: Physical Therapy

## 2019-03-17 DIAGNOSIS — I10 Essential (primary) hypertension: Secondary | ICD-10-CM

## 2019-03-17 DIAGNOSIS — I5032 Chronic diastolic (congestive) heart failure: Secondary | ICD-10-CM

## 2019-03-17 DIAGNOSIS — D62 Acute posthemorrhagic anemia: Secondary | ICD-10-CM

## 2019-03-17 DIAGNOSIS — N179 Acute kidney failure, unspecified: Secondary | ICD-10-CM

## 2019-03-17 DIAGNOSIS — D696 Thrombocytopenia, unspecified: Secondary | ICD-10-CM

## 2019-03-17 MED ORDER — HYDRALAZINE HCL 50 MG PO TABS
75.0000 mg | ORAL_TABLET | Freq: Three times a day (TID) | ORAL | Status: DC
  Administered 2019-03-17 – 2019-04-03 (×50): 75 mg via ORAL
  Filled 2019-03-17 (×52): qty 1

## 2019-03-17 NOTE — Progress Notes (Signed)
Physical Therapy Session Note  Patient Details  Name: [name] [name] MRN: [id number] Date of Birth: 1936/08/25  Today's Date: 03/17/2019 PT Individual Time: 0630-1601 PT Individual Time Calculation (min): 45 min   Short Term Goals: Week 1:  PT Short Term Goal 1 (Week 1): Pt will complete least restrictive transfers with min A consistently PT Short Term Goal 2 (Week 1): Pt will ambulate x 25 ft with LRAD and min A  Skilled Therapeutic Interventions/Progress Updates:  Pt was seen bedside in the pm. Pt performed multiple sit to stand and stand pivot transfers with c.g and rolling walker with verbal cues. Pt ambulated 160 feet x 2 with rolling walker and c/g with verbal cues. Pt performed step taps and alternating step taps 3 sets x 10 reps each. Pt required mod A and verbal cues for edge of bed to supine. Pt left sitting up in bed with call bell within reach and bed alarm on.   Therapy Documentation Precautions:  Precautions Precautions: Fall Restrictions Weight Bearing Restrictions: No General:   Pain: Pain Assessment Pain Scale: 0-10 Pain Score: 0-No pain  Therapy/Group: Individual Therapy  Dub Amis 03/17/2019, 3:46 PM

## 2019-03-17 NOTE — Progress Notes (Signed)
PHYSICAL MEDICINE & REHABILITATION PROGRESS NOTE   Subjective/Complaints: Patient seen laying in bed this morning.  She states she slept well overnight.  She notes some itching in her axilla, which she thinks is because of dryness.  ROS: Denies CP, SOB, N/V/D  Objective:   No results found. No results for input(s): WBC, HGB, HCT, PLT in the last 72 hours. Recent Labs    03/16/19 0531  NA 141  K 3.8  CL 113*  CO2 22  GLUCOSE 113*  BUN 23  CREATININE 1.23*  CALCIUM 8.7*    Intake/Output Summary (Last 24 hours) at 03/17/2019 0836 Last data filed at 03/16/2019 2300 Gross per 24 hour  Intake 700 ml  Output 100 ml  Net 600 ml     Physical Exam: Vital Signs Blood pressure (!) 170/95, pulse 68, temperature 98.1 F (36.7 C), temperature source Oral, resp. rate 16, height 5\' 1"  (1.549 m), weight 80.7 kg, SpO2 94 %. Constitutional: NAD.  Vital signs reviewed. HENT: Normocephalic periorbital ecchymosis improving Eyes: EOMI.  No discharge. Cardiovascular: No JVD    Respiratory: Normal effort    GI: non-distended  Musculoskeletal: Bilateral lower extremity edema Neurological: She is alertand oriented  Motor:  Bilateral lower extremities grossly 4--4/5 proximal to distal Skin: Skin iswarmand dry. Psychiatric: Pleasant and cooperative  Assessment/Plan: 1. Functional deficits secondary to rhabdomyolysis, debility which require 3+ hours per day of interdisciplinary therapy in a comprehensive inpatient rehab setting.  Physiatrist is providing close team supervision and 24 hour management of active medical problems listed below.  Physiatrist and rehab team continue to assess barriers to discharge/monitor patient progress toward functional and medical goals  Care Tool:  Bathing    Body parts bathed by patient: Right arm, Left arm, Chest, Abdomen, Face   Body parts bathed by helper: Front perineal area, Buttocks, Right upper leg, Left upper leg, Right lower leg,  Left lower leg     Bathing assist Assist Level: Maximal Assistance - Patient 24 - 49%     Upper Body Dressing/Undressing Upper body dressing   What is the patient wearing?: Pull over shirt    Upper body assist Assist Level: Moderate Assistance - Patient 50 - 74%    Lower Body Dressing/Undressing Lower body dressing      What is the patient wearing?: Pants     Lower body assist Assist for lower body dressing: Total Assistance - Patient < 25%     Toileting Toileting    Toileting assist Assist for toileting: Moderate Assistance - Patient 50 - 74%     Transfers Chair/bed transfer  Transfers assist  Chair/bed transfer activity did not occur: Safety/medical concerns  Chair/bed transfer assist level: Contact Guard/Touching assist     Locomotion Ambulation   Ambulation assist      Assist level: Contact Guard/Touching assist Assistive device: Walker-rolling Max distance: 160'   Walk 10 feet activity   Assist     Assist level: Contact Guard/Touching assist Assistive device: Walker-rolling   Walk 50 feet activity   Assist Walk 50 feet with 2 turns activity did not occur: Safety/medical concerns  Assist level: Contact Guard/Touching assist Assistive device: Walker-rolling    Walk 150 feet activity   Assist Walk 150 feet activity did not occur: Safety/medical concerns  Assist level: Contact Guard/Touching assist Assistive device: Walker-rolling    Walk 10 feet on uneven surface  activity   Assist Walk 10 feet on uneven surfaces activity did not occur: Safety/medical concerns  Wheelchair     Assist Will patient use wheelchair at discharge?: No             Wheelchair 50 feet with 2 turns activity    Assist            Wheelchair 150 feet activity     Assist          Medical Problem List and Plan: 1.Functional mobility deficitssecondary to rhabdomyolysis, pneumonia, ETOH abuse.  Continue CIR  Notes  reviewed multiple medical debility, labs reviewed 2. Antithrombotics: -DVT/anticoagulation:Pharmaceutical:Lovenox -antiplatelet therapy: ASA 3. Pain Management:tylenol prn. 4. Mood:team providing ego support  Neuropsych eval  -baseline anxiety present but seems to be managing fairly well at present -antipsychotic agents:  Seroquel and librium(used 10 mg bid for anxiety at home). 5. Neuropsych: This patientis?  Notfully capable of making decisions on herown behalf. 6. Skin/Wound Care:Routine pressure relief measures. 7. Fluids/Electrolytes/Nutrition:encourage PO, eating well as of late 8. Aspiration PNA?:transitioned toAugment fora week ofantibiotic course to be completed tomorrow. 9. AKI with mild rhabdomyolysis:    -pushing fluids  - Cr stable/decr to 1.23 on 4/3  Labs ordered for Monday 10.Chronic diastolic CHF: Daily weights  -stable at present [facility] Weights   03/13/19 1800 03/14/19 0512 03/17/19 0513  Weight: 80 kg 80.2 kg 80.7 kg    Stable on 4/4 11. H/o anxiety/depression: Lost husband 12/19.Managed by Dr. Vilma Meckel and librium. 12. Abnormal LFTs:normal 4/1 13. Fall risk: Continue high low bed.  14.  Constipation   Improving  -continue probiotic  -sorbitol prn 15.  Essential hypertension  Hydralazine 50 3 times daily, increased to 75 3 times daily on 4/4  Continue Toprolol 75 daily 16.  Acute blood loss anemia  Hemoglobin 10.6 on 4/1  Continue to monitor 17.  Thrombocytopenia  Platelets 115 on 4/1  Labs ordered for Monday     LOS: 4 days A FACE TO FACE EVALUATION WAS PERFORMED  [name] [name] [name] [name] 03/17/2019, 8:36 AM

## 2019-03-17 NOTE — Progress Notes (Signed)
Occupational Therapy Session Note  Patient Details  Name: [name] [name] MRN: [id number] Date of Birth: Apr 09, 1936  Today's Date: 03/17/2019 OT Individual Time: 1300-1400 OT Individual Time Calculation (min): 60 min    Short Term Goals: Week 1:  OT Short Term Goal 1 (Week 1): Pt will complete bathing with min assist to demonstrate increased activity tolerance OT Short Term Goal 2 (Week 1): Pt will complete LB dressing (except footwear) with Min assist OT Short Term Goal 3 (Week 1): Pt will complete 2 grooming tasks in standing with CGA to demonstrate increased activity tolerance OT Short Term Goal 4 (Week 1): Pt will complete toilet transfer with min assist with LRAD  Skilled Therapeutic Interventions/Progress Updates:    Patient seated in w/c finishing lunch to start session.  She struggles with standard utensils due to arthritic hands - provided with foam to build up handles which she is able to use with greater comfort and less time to complete eating task.   Completed seated and standing dynamic balance, stretching, reaching tasks with CG A and unilateral or bilateral UE support on walker.  She needed a brief rest break between tasks but overall with good endurance for these activities.   Patient remained seated in w/c at close of session with seat belt alarm set and tray table/call bell in reach.    Therapy Documentation Precautions:  Precautions Precautions: Fall Restrictions Weight Bearing Restrictions: No General:   Vital Signs: Therapy Vitals Temp: (!) 93 F (33.9 C) Pulse Rate: (!) 55 BP: (!) 151/59 Patient Position (if appropriate): Sitting Pain: Pain Assessment Pain Scale: 0-10 Pain Score: 0-No pain   Therapy/Group: Individual Therapy  Carlos Levering 03/17/2019, 3:38 PM

## 2019-03-17 NOTE — Progress Notes (Signed)
Occupational Therapy Session Note  Patient Details  Name: [name] [name] MRN: [id number] Date of Birth: 04/17/1936  Today's Date: 03/17/2019 OT Individual Time: 0932-6712 OT Individual Time Calculation (min): 90 min    Short Term Goals: Week 1:  OT Short Term Goal 1 (Week 1): Pt will complete bathing with min assist to demonstrate increased activity tolerance OT Short Term Goal 2 (Week 1): Pt will complete LB dressing (except footwear) with Min assist OT Short Term Goal 3 (Week 1): Pt will complete 2 grooming tasks in standing with CGA to demonstrate increased activity tolerance OT Short Term Goal 4 (Week 1): Pt will complete toilet transfer with min assist with LRAD  Skilled Therapeutic Interventions/Progress Updates:    Patient seated in w/c and ready for therapy session.  She is in good spirits today and pleased with progress.   ADL/shower:  Bathing with mod A for buttocks and lower legs, toileting mod A, UB dressing mod A (dependent for buttons), LB dressing max A, slipper socks dependent, grooming set up.  SPT to/from toilet, shower bench, w/c, bed with CG/min A, mod A for bed mobility SSP to supine. Ambulation with RW x2 CG A household distances.  Patient returned to bed for a rest break at close of session with bed alarm set and call bell in reach.    Therapy Documentation Precautions:  Precautions Precautions: Fall Restrictions Weight Bearing Restrictions: No General:   Vital Signs: Therapy Vitals Pulse Rate: 62 BP: (!) 142/54 Patient Position (if appropriate): Sitting Pain: Pain Assessment Pain Scale: 0-10 Pain Score: 0-No pain   Therapy/Group: Individual Therapy  Carlos Levering 03/17/2019, 12:11 PM

## 2019-03-18 DIAGNOSIS — J690 Pneumonitis due to inhalation of food and vomit: Secondary | ICD-10-CM

## 2019-03-18 NOTE — Progress Notes (Signed)
[place] PHYSICAL MEDICINE & REHABILITATION PROGRESS NOTE   Subjective/Complaints: Patient seen sitting up in bed this morning.  She states she slept well overnight.  She asks for lip balm.  ROS: Denies CP, SOB, N/V/D  Objective:   No results found. No results for input(s): WBC, HGB, HCT, PLT in the last 72 hours. Recent Labs    03/16/19 0531  NA 141  K 3.8  CL 113*  CO2 22  GLUCOSE 113*  BUN 23  CREATININE 1.23*  CALCIUM 8.7*    Intake/Output Summary (Last 24 hours) at 03/18/2019 0753 Last data filed at 03/17/2019 1900 Gross per 24 hour  Intake 720 ml  Output -  Net 720 ml     Physical Exam: Vital Signs Blood pressure 128/65, pulse (!) 58, temperature 98.2 F (36.8 C), temperature source Oral, resp. rate 17, height 5\' 1"  (1.549 m), weight 80.5 kg, SpO2 93 %. Constitutional: NAD.  Vital signs reviewed. HENT: Normocephalic periorbital ecchymosis improving, right greater than left Eyes: EOMI.  No discharge. Cardiovascular: No JVD    Respiratory: Normal effort    GI: non-distended  Musculoskeletal: Bilateral lower extremity edema Neurological: She is alertand oriented, except for date of month Motor:  Bilateral lower extremities grossly 4/5 proximal to distal Skin: Skin iswarmand dry. Psychiatric: Pleasant and cooperative  Assessment/Plan: 1. Functional deficits secondary to rhabdomyolysis, debility which require 3+ hours per day of interdisciplinary therapy in a comprehensive inpatient rehab setting.  Physiatrist is providing close team supervision and 24 hour management of active medical problems listed below.  Physiatrist and rehab team continue to assess barriers to discharge/monitor patient progress toward functional and medical goals  Care Tool:  Bathing    Body parts bathed by patient: Right arm, Left arm, Chest, Abdomen, Face, Right upper leg, Left upper leg, Front perineal area   Body parts bathed by helper: Buttocks, Right lower leg, Left  lower leg     Bathing assist Assist Level: Moderate Assistance - Patient 50 - 74%     Upper Body Dressing/Undressing Upper body dressing   What is the patient wearing?: Button up shirt    Upper body assist Assist Level: Moderate Assistance - Patient 50 - 74%    Lower Body Dressing/Undressing Lower body dressing      What is the patient wearing?: Pants, Incontinence brief     Lower body assist Assist for lower body dressing: Maximal Assistance - Patient 25 - 49%     Toileting Toileting    Toileting assist Assist for toileting: Moderate Assistance - Patient 50 - 74%     Transfers Chair/bed transfer  Transfers assist  Chair/bed transfer activity did not occur: Safety/medical concerns  Chair/bed transfer assist level: Moderate Assistance - Patient 50 - 74%     Locomotion Ambulation   Ambulation assist      Assist level: Contact Guard/Touching assist Assistive device: Walker-rolling Max distance: 160   Walk 10 feet activity   Assist     Assist level: Contact Guard/Touching assist Assistive device: Walker-rolling   Walk 50 feet activity   Assist Walk 50 feet with 2 turns activity did not occur: Safety/medical concerns  Assist level: Contact Guard/Touching assist Assistive device: Walker-rolling    Walk 150 feet activity   Assist Walk 150 feet activity did not occur: Safety/medical concerns  Assist level: Contact Guard/Touching assist Assistive device: Walker-rolling    Walk 10 feet on uneven surface  activity   Assist Walk 10 feet on uneven surfaces activity did not occur:  Safety/medical concerns         Wheelchair     Assist Will patient use wheelchair at discharge?: No             Wheelchair 50 feet with 2 turns activity    Assist            Wheelchair 150 feet activity     Assist          Medical Problem List and Plan: 1.Functional mobility deficitssecondary to rhabdomyolysis, pneumonia, ETOH  abuse.  Continue CIR  Notes reviewed multiple medical debility, labs reviewed 2. Antithrombotics: -DVT/anticoagulation:Pharmaceutical:Lovenox -antiplatelet therapy: ASA 3. Pain Management:tylenol prn. 4. Mood:team providing ego support  Neuropsych eval  -baseline anxiety present but seems to be managing fairly well at present -antipsychotic agents:  Seroquel and librium(used 10 mg bid for anxiety at home). 5. Neuropsych: This patientis?  Notfully capable of making decisions on herown behalf. 6. Skin/Wound Care:Routine pressure relief measures. 7. Fluids/Electrolytes/Nutrition:encourage PO, eating well as of late 8. Aspiration PNA?:transitioned toAugment fora week ofantibiotic course to be completed today. 9. AKI with mild rhabdomyolysis:    -pushing fluids  -Cr stable/decr to 1.23 on 4/3  Labs ordered for tomorrow 10.Chronic diastolic CHF: Daily weights  -stable at present [facility] Weights   03/14/19 0512 03/17/19 0513 03/18/19 0448  Weight: 80.2 kg 80.7 kg 80.5 kg    Stable on 4/5 11. H/o anxiety/depression: Lost husband 12/19.Managed by Dr. Vilma Meckel and librium. 12. Abnormal LFTs:normal 4/1 13. Fall risk: Continue high low bed.  14.  Constipation   Improving  -continue probiotic  -sorbitol prn 15.  Essential hypertension  Hydralazine 50 3 times daily, increased to 75 3 times daily on 4/4  Continue Toprolol 75 daily  Labile, but?  Improving on 4/5 16.  Acute blood loss anemia  Hemoglobin 10.6 on 4/1  Continue to monitor 17.  Thrombocytopenia  Platelets 115 on 4/1  Labs ordered for tomorrow     LOS: 5 days A FACE TO FACE EVALUATION WAS PERFORMED   Lorie Phenix 03/18/2019, 7:53 AM

## 2019-03-19 ENCOUNTER — Inpatient Hospital Stay (HOSPITAL_COMMUNITY): Payer: Self-pay | Admitting: Physical Therapy

## 2019-03-19 ENCOUNTER — Inpatient Hospital Stay (HOSPITAL_COMMUNITY): Payer: Self-pay | Admitting: Occupational Therapy

## 2019-03-19 ENCOUNTER — Inpatient Hospital Stay (HOSPITAL_COMMUNITY): Payer: Medicare Other

## 2019-03-19 ENCOUNTER — Inpatient Hospital Stay (HOSPITAL_COMMUNITY): Payer: Self-pay | Admitting: Speech Pathology

## 2019-03-19 LAB — CBC
HCT: 33 % — ABNORMAL LOW (ref 36.0–46.0)
Hemoglobin: 10.6 g/dL — ABNORMAL LOW (ref 12.0–15.0)
MCH: 31.5 pg (ref 26.0–34.0)
MCHC: 32.1 g/dL (ref 30.0–36.0)
MCV: 97.9 fL (ref 80.0–100.0)
Platelets: 142 10*3/uL — ABNORMAL LOW (ref 150–400)
RBC: 3.37 MIL/uL — ABNORMAL LOW (ref 3.87–5.11)
RDW: 13.1 % (ref 11.5–15.5)
WBC: 7.3 10*3/uL (ref 4.0–10.5)
nRBC: 0 % (ref 0.0–0.2)

## 2019-03-19 LAB — BASIC METABOLIC PANEL
Anion gap: 6 (ref 5–15)
BUN: 21 mg/dL (ref 8–23)
CO2: 24 mmol/L (ref 22–32)
Calcium: 8.8 mg/dL — ABNORMAL LOW (ref 8.9–10.3)
Chloride: 110 mmol/L (ref 98–111)
Creatinine, Ser: 1.34 mg/dL — ABNORMAL HIGH (ref 0.44–1.00)
GFR calc Af Amer: 43 mL/min — ABNORMAL LOW (ref >60)
GFR calc non Af Amer: 37 mL/min — ABNORMAL LOW (ref >60)
Glucose, Bld: 119 mg/dL — ABNORMAL HIGH (ref 70–99)
Potassium: 4.1 mmol/L (ref 3.5–5.1)
Sodium: 140 mmol/L (ref 135–145)

## 2019-03-19 MED ORDER — FUROSEMIDE 20 MG PO TABS
20.0000 mg | ORAL_TABLET | Freq: Every day | ORAL | Status: DC
  Administered 2019-03-19 – 2019-03-20 (×2): 20 mg via ORAL
  Filled 2019-03-19 (×2): qty 1

## 2019-03-19 NOTE — Progress Notes (Signed)
Speech Language Pathology Daily Session Note  Patient Details  Name: [name] [name] MRN: [id number] Date of Birth: Feb 26, 1936  Today's Date: 03/19/2019 SLP Individual Time: 8466-5993 SLP Individual Time Calculation (min): 55 min  Short Term Goals: Week 1: SLP Short Term Goal 1 (Week 1): Pt will complete mildly complex tasks with supervision cues for functional problem solving.   SLP Short Term Goal 2 (Week 1): Pt will recall complex, daily information with supervision cues for use of external aids.   SLP Short Term Goal 3 (Week 1): Pt will anticipate barriers to discharge and generate appropriate solutions with supervision cues.    Skilled Therapeutic Interventions: Skilled treatment session focused on cognitive goals. SLP facilitated session by providing Max A verbal cues for working memory and Min-Mod A verbal cues for functional problem solving during a basic money management task with function improving with visual aids to maximize recall of information. Patient with emergent awareness in regards to difficulty of task. Patient left upright in wheelchair with all needs within reach. Continue with current plan of care.      Pain No/Denies Pain   Therapy/Group: Individual Therapy  Kamaree Berkel, [place] 03/19/2019, 3:21 PM

## 2019-03-19 NOTE — Progress Notes (Signed)
Occupational Therapy Session Note  Patient Details  Name: [name] [name] MRN: [id number] Date of Birth: 05-15-1936  Today's Date: 03/19/2019 OT Individual Time: 0930-1030 OT Individual Time Calculation (min): 60 min    Short Term Goals: Week 1:  OT Short Term Goal 1 (Week 1): Pt will complete bathing with min assist to demonstrate increased activity tolerance OT Short Term Goal 2 (Week 1): Pt will complete LB dressing (except footwear) with Min assist OT Short Term Goal 3 (Week 1): Pt will complete 2 grooming tasks in standing with CGA to demonstrate increased activity tolerance OT Short Term Goal 4 (Week 1): Pt will complete toilet transfer with min assist with LRAD  Skilled Therapeutic Interventions/Progress Updates:    Pt seen for OT ADL bathing/dressing session. Pt in supine upon arrival, awake and agreeable to tx session. Denied pain. Complaints of SOB throughout night, reports having just had chest x-ray. Pt cued through deep breathing throughout session. O2 checked periodically during session ,92% on RA with exertion.  She transferred to sitting EOB with mod A using hospital bed functions. She completed short distance ambulation within room with CGA using RW. She completed bathing/dressing routine from w/c level at sink. UB bathing completed with set-up/supervision. Assist to button button down shirt. She required mod A overall for LB dressing,  Unable to reacher lower legs. Difficulty managing LEs 2/2 edema. Lotion donned per pt request.  Education/demonstration provided for use of reacher to assisting with doffing and donning LB clothing. She was able to return demonstrate use, excited to be able to thread her own pants on today. She stood at sink with steadying assist to pull pants up. Total A for TED hose and non-slid socks.  Requested toileting task at end of session. CGA stand pivot transfer to toilet with assist to manage in prep for toileting 2/2 urgency. Pt left seated on toilet,  instructed in use of pull cord call bell and RN made aware of pt's position.   92% on RA while   Therapy Documentation Precautions:  Precautions Precautions: Fall Restrictions Weight Bearing Restrictions: No   Therapy/Group: Individual Therapy  Daianna Vasques L 03/19/2019, 7:13 AM

## 2019-03-19 NOTE — Progress Notes (Signed)
Physical Therapy Session Note  Patient Details  Name: YANCY HASCALL MRN: [id number] Date of Birth: Jun 09, 1936  Today's Date: 03/19/2019 PT Individual Time: 8338-2505 PT Individual Time Calculation (min): 45 min   Short Term Goals: Week 1:  PT Short Term Goal 1 (Week 1): Pt will complete least restrictive transfers with min A consistently PT Short Term Goal 2 (Week 1): Pt will ambulate x 25 ft with LRAD and min A  Skilled Therapeutic Interventions/Progress Updates: Pt presented in bed agreeable to therapy. Pt indicating need to urinary void. Performed bed mobility with HOB elevated and minA for truncal support with increased time. Performed stand pivot with RW to [facility] with cues to increase anterior wt shifting upon standing as noted posterior lean. Pt required cues for hand placement during decent onto BSC (+void). PTA threaded pants at [facility] and pt was able to provide minA with clothing management upon standing with RW. Pt returned to EOB and donned button up shirt with minA and total A for buttoning shirt. Pt then ambulated to Day room CGA with cues for staying within RW and noted shortened step length with poor B foot clearance to NuStep. Pt performed NuStep L3 x 8 min for global conditioning and endurance. PTA notified transport present to take pt to x-ray. Pt performed stand pivot  CGA with RW to w/c and transported back to room. Performed stand pivot to bed in same manner as prior and required modA for BLE management sit to supine. Pt able to reposition to comfort and left with bed alarm on, call bell within reach and needs met.      Therapy Documentation Precautions:  Precautions Precautions: Fall Restrictions Weight Bearing Restrictions: No General:   Vital Signs: Therapy Vitals Pulse Rate: (!) 58 Resp: 20 BP: (!) 153/57 Patient Position (if appropriate): Sitting Oxygen Therapy SpO2: 97 % O2 Device: Room Air   Therapy/Group: Individual Therapy  Dalante Minus  Evy Lutterman, PTA  03/19/2019, 3:23 PM

## 2019-03-19 NOTE — Progress Notes (Signed)
[name] [name] and [name] [name] PHYSICAL MEDICINE & REHABILITATION PROGRESS NOTE   Subjective/Complaints:  No issues overnite except some breathing problems, only coughs with fluids, no fever or chills  ROS: Denies CP, SOB, N/V/D  Objective:   No results found. Recent Labs    03/19/19 0352  WBC 7.3  HGB 10.6*  HCT 33.0*  PLT 142*   Recent Labs    03/19/19 0352  NA 140  K 4.1  CL 110  CO2 24  GLUCOSE 119*  BUN 21  CREATININE 1.34*  CALCIUM 8.8*    Intake/Output Summary (Last 24 hours) at 03/19/2019 6606 Last data filed at 03/18/2019 2230 Gross per 24 hour  Intake 520 ml  Output 75 ml  Net 445 ml     Physical Exam: Vital Signs Blood pressure (!) 174/70, pulse 67, temperature 98.1 F (36.7 C), resp. rate 19, height 5\' 1"  (1.549 m), weight 82.4 kg, SpO2 95 %. Constitutional: NAD.  Vital signs reviewed. HENT: Normocephalic periorbital ecchymosis improving, right greater than left Eyes: EOMI.  No discharge. Cardiovascular: No JVD    Respiratory: Normal effort    GI: non-distended  Musculoskeletal: Bilateral lower extremity edema Neurological: She is alertand oriented, except for date of month Motor:  Bilateral lower extremities grossly 4/5 proximal to distal Ext- 1+ bilateral pre tib edema, stasis dermatitis changes with flaking bilaterally  Skin: Skin iswarmand dry. Psychiatric: Pleasant and cooperative  Assessment/Plan: 1. Functional deficits secondary to rhabdomyolysis, debility which require 3+ hours per day of interdisciplinary therapy in a comprehensive inpatient rehab setting.  Physiatrist is providing close team supervision and 24 hour management of active medical problems listed below.  Physiatrist and rehab team continue to assess barriers to discharge/monitor patient progress toward functional and medical goals  Care Tool:  Bathing    Body parts bathed by patient: Right arm, Left arm, Chest, Abdomen, Face, Right upper leg, Left upper leg, Front perineal area    Body parts bathed by helper: Buttocks, Right lower leg, Left lower leg     Bathing assist Assist Level: Moderate Assistance - Patient 50 - 74%     Upper Body Dressing/Undressing Upper body dressing   What is the patient wearing?: Button up shirt    Upper body assist Assist Level: Moderate Assistance - Patient 50 - 74%    Lower Body Dressing/Undressing Lower body dressing      What is the patient wearing?: Pants, Incontinence brief     Lower body assist Assist for lower body dressing: Maximal Assistance - Patient 25 - 49%     Toileting Toileting    Toileting assist Assist for toileting: Moderate Assistance - Patient 50 - 74%     Transfers Chair/bed transfer  Transfers assist  Chair/bed transfer activity did not occur: Safety/medical concerns  Chair/bed transfer assist level: Moderate Assistance - Patient 50 - 74%     Locomotion Ambulation   Ambulation assist      Assist level: Contact Guard/Touching assist Assistive device: Walker-rolling Max distance: 160   Walk 10 feet activity   Assist     Assist level: Contact Guard/Touching assist Assistive device: Walker-rolling   Walk 50 feet activity   Assist Walk 50 feet with 2 turns activity did not occur: Safety/medical concerns  Assist level: Contact Guard/Touching assist Assistive device: Walker-rolling    Walk 150 feet activity   Assist Walk 150 feet activity did not occur: Safety/medical concerns  Assist level: Contact Guard/Touching assist Assistive device: Walker-rolling    Walk 10 feet on uneven surface  activity   Assist Walk 10 feet on uneven surfaces activity did not occur: Safety/medical concerns         Wheelchair     Assist Will patient use wheelchair at discharge?: No             Wheelchair 50 feet with 2 turns activity    Assist            Wheelchair 150 feet activity     Assist          Medical Problem List and Plan: 1.Functional  mobility deficitssecondary to rhabdomyolysis, pneumonia, ETOH abuse.  Continue CIR team conf in am  Notes reviewed multiple medical debility, labs reviewed 2. Antithrombotics: -DVT/anticoagulation:Pharmaceutical:Lovenox -antiplatelet therapy: ASA 3. Pain Management:tylenol prn. 4. Mood:team providing ego support  Neuropsych eval  -baseline anxiety present but seems to be managing fairly well at present -antipsychotic agents:  Seroquel and librium(used 10 mg bid for anxiety at home). 5. Neuropsych: This patientis?  Notfully capable of making decisions on herown behalf. 6. Skin/Wound Care:Routine pressure relief measures. 7. Fluids/Electrolytes/Nutrition:encourage PO, eating well as of late 8. Aspiration PNA?:transitioned toAugment fora week ofantibiotic course to be completed today. 9. AKI with mild rhabdomyolysis:    -pushing fluids  -Cr stable/decr to 1.23 on 4/3  Labs ordered for tomorrow 10.Chronic diastolic CHF: Daily weights  -elevated- check CXR Filed Weights   03/17/19 0513 03/18/19 0448 03/19/19 0415  Weight: 80.7 kg 80.5 kg 82.4 kg    increased on 4/6, add lasix 11. H/o anxiety/depression: Lost husband 12/19.Managed by Dr. Vilma Meckel and librium. 12. Abnormal LFTs:normal 4/1 13. Fall risk: Continue high low bed.  14.  Constipation   Improving  -continue probiotic  -sorbitol prn 15.  Essential hypertension  Hydralazine 50 3 times daily, increased to 75 3 times daily on 4/4  Continue Toprol75 daily   Vitals:   03/18/19 2031 03/19/19 0415  BP: (!) 158/62 (!) 174/70  Pulse: (!) 57 67  Resp: 19 19  Temp:    SpO2: 96% 95%  elevated , given  Peripheral edema will add low dose diuretic 16.  Acute blood loss anemia  Hemoglobin 10.6 on 4/1  Continue to monitor 17.  Thrombocytopenia  Platelets 115 on 4/1 Improved to 142 on 4/6    LOS: 6 days A FACE TO [place] E  Kinsie Belford 03/19/2019, 8:33 AM

## 2019-03-19 NOTE — Progress Notes (Signed)
Physical Therapy Session Note  Patient Details  Name: TAJUANNA BURNETT MRN: [id number] Date of Birth: 07/20/1936  Today's Date: 03/19/2019 PT Individual Time: 1130-1230 PT Individual Time Calculation (min): 60 min   Short Term Goals: Week 1:  PT Short Term Goal 1 (Week 1): Pt will complete least restrictive transfers with min A consistently PT Short Term Goal 2 (Week 1): Pt will ambulate x 25 ft with LRAD and min A  Skilled Therapeutic Interventions/Progress Updates:    Pt received seated in w/c in room, agreeable to PT session. No complaints of pain but does report feeling more fatigued and SOA this date due to increase in fluid buildup and swelling, medical team aware. Sit to stand with min A to RW, ambulation to bathroom with RW and CGA. Toilet transfer with min A, min A for clothing management. Ambulation 2 x 59' with RW and CGA, decrease in tolerance for gait this date with onset of fatigue and SOA with shorter distance ambulation. Sit to stand from low pliable surface of couch with min A to RW. Sit to stand 2 x 5 reps from mat table to RW with min A, multimodal cueing for anterior weight shift and hand placement with transfer. Pt exhibits decrease in eccentric control when sitting with onset of fatigue. Provided patient with BLE elevating leg rests for LE edema management. Pt left seated in w/c in room with needs in reach, quick release belt and chair alarm in place.  Therapy Documentation Precautions:  Precautions Precautions: Fall Restrictions Weight Bearing Restrictions: No    Therapy/Group: Individual Therapy   Excell Seltzer, PT, DPT  03/19/2019, 12:57 PM

## 2019-03-20 ENCOUNTER — Inpatient Hospital Stay (HOSPITAL_COMMUNITY): Payer: Self-pay | Admitting: Occupational Therapy

## 2019-03-20 ENCOUNTER — Inpatient Hospital Stay (HOSPITAL_COMMUNITY): Payer: Self-pay | Admitting: Physical Therapy

## 2019-03-20 ENCOUNTER — Inpatient Hospital Stay (HOSPITAL_COMMUNITY): Payer: Self-pay

## 2019-03-20 ENCOUNTER — Inpatient Hospital Stay (HOSPITAL_COMMUNITY): Payer: Self-pay | Admitting: Speech Pathology

## 2019-03-20 MED ORDER — FUROSEMIDE 40 MG PO TABS
40.0000 mg | ORAL_TABLET | Freq: Every day | ORAL | Status: DC
  Administered 2019-03-21 – 2019-03-23 (×3): 40 mg via ORAL
  Filled 2019-03-20 (×3): qty 1

## 2019-03-20 NOTE — Progress Notes (Signed)
Occupational Therapy Session Note  Patient Details  Name: [name] [name] MRN: [id number] Date of Birth: Oct 27, 1936  Today's Date: 03/20/2019 OT Individual Time: 1035-1130 OT Individual Time Calculation (min): 55 min    Short Term Goals: Week 1:  OT Short Term Goal 1 (Week 1): Pt will complete bathing with min assist to demonstrate increased activity tolerance OT Short Term Goal 2 (Week 1): Pt will complete LB dressing (except footwear) with Min assist OT Short Term Goal 3 (Week 1): Pt will complete 2 grooming tasks in standing with CGA to demonstrate increased activity tolerance OT Short Term Goal 4 (Week 1): Pt will complete toilet transfer with min assist with LRAD  Skilled Therapeutic Interventions/Progress Updates:    Pt seen for OT session focusing on ADL re-training and AE training. Pt with reports of arthritic pain in R index finger, RN already aware and pt agreeable to cont therapy without intervention.  Pt in w/c receiving assist from NT on getting to toilet, hand off to OT. COmpleted min A stand pivot transfer to toilet with use of grab bars. With steadying assist, pt able to manage clothing in prep for toileting task, maintaining dynamic standing balance without UE support. Pt with complaints of diarrhea and burning with voiding. RN made aware. Total A for hygiene following BM and pt able to assist with pulling pants up, assit to advance entirely over hips. She ambulated out of bathroom with CGA using RW. Hand hygiene completed standing at sink to address functional standing balance and endurance. She tolerated ~5 minutes in standing total before requiring seated rest break. Pt observed to be shaking with all mobility, pt reports she did not eat much breakfast this morning. Agreeable to having snack and education provided on proper nutrition and fueling of body for activity. Pt ale to self-serve Graham crackers and peanut butter, requiring assist to open containers, however, with increased  time able to spread peanut buttoner onto cracker with knife. While pt ate snack, eduction/discussion on pt's PLOF and therapy goals.  Addressed LB dressing goal with education and demonstration provided regarding use of sock aid. She was able to recall use of reacher to doff socks taught in yesterday's session and completed with min A. She then return demonstrated use of sock aid donning B socks with min A for positioning. Pt limited in use of AE due to arthritic and painful digits, however, with increased time was able to use both at functional level. Pt reports liking sock aid, stating "I'll definitely use this at home!" Pt left seated in w/c at end of session with chair belt alarm on and all needs in reach.   Therapy Documentation Precautions:  Precautions Precautions: Fall Restrictions Weight Bearing Restrictions: No   Therapy/Group: Individual Therapy  Clemens Lachman L 03/20/2019, 7:10 AM

## 2019-03-20 NOTE — Progress Notes (Signed)
Physical Therapy Session Note  Patient Details  Name: [name] [name] MRN: [id number] Date of Birth: 22-May-1936  Today's Date: 03/20/2019 PT Individual Time: 1145-1230 PT Individual Time Calculation (min): 45 min   Short Term Goals: Week 1:  PT Short Term Goal 1 (Week 1): Pt will complete least restrictive transfers with min A consistently PT Short Term Goal 2 (Week 1): Pt will ambulate x 25 ft with LRAD and min A  Skilled Therapeutic Interventions/Progress Updates:    [facility], pleasant and willing to work with therapy. Able to complete functional transfers with MinA and RW, then ambulated 68f in room with RW and min guard but had difficulty in navigating small step up into bathroom. Toilet transfer with MinA and able to maintain balance with no UE support for pericare. [facility] and then was [facility] to fatigue. Tolerated gait training an additional 752fwith RW and min guard, VSS. Finished session with step ups onto foam pad and practice maintaining balance on foam pad with B UE support due to significant shaking and weakness while on unsteady surface. She was left up in her chair with all needs met, seat belt alarm active this morning.   Therapy Documentation Precautions:  Precautions Precautions: Fall Restrictions Weight Bearing Restrictions: No General:   Vital Signs:  Pain: Pain Assessment Pain Scale: 0-10 Pain Score: 7  Pain Type: Chronic pain Pain Location: Finger (Comment which one)(R thumb and index finger ) Pain Orientation: Right Pain Descriptors / Indicators: Aching;Sore Pain Onset: On-going Patients Stated Pain Goal: 0 Pain Intervention(s): RN made aware;Medication (See eMAR) Multiple Pain Sites: No    Therapy/Group: Individual Therapy   KrDeniece ReeT, DPT, CBIS  Supplemental Physical Therapist [facility]  Pager [phone] [phone]  03/20/2019, 12:45  PM

## 2019-03-20 NOTE — Progress Notes (Signed)
[place] PHYSICAL MEDICINE & REHABILITATION PROGRESS NOTE   Subjective/Complaints:  Patient feels her shortness of breath has improved.  She did notice urinating a little bit more  ROS: Denies CP, SOB, N/V/D  Objective:   Dg Chest 2 View  Result Date: 03/19/2019 CLINICAL DATA:  Shortness of breath. EXAM: CHEST - 2 VIEW COMPARISON:  03/11/2019 FINDINGS: The heart size and mediastinal contours are within normal limits. The lung volumes are low bilaterally with bibasilar atelectasis present, left greater than right. Cannot exclude subtle infiltrate at the left lung base. There are tiny bilateral pleural effusions. The visualized skeletal structures are unremarkable. IMPRESSION: Bibasilar atelectasis. Cannot exclude infiltrate at the left lung base. Small bilateral pleural effusions. Electronically Signed   By: Aletta Edouard M.D.   On: 03/19/2019 09:35   Recent Labs    03/19/19 0352  WBC 7.3  HGB 10.6*  HCT 33.0*  PLT 142*   Recent Labs    03/19/19 0352  NA 140  K 4.1  CL 110  CO2 24  GLUCOSE 119*  BUN 21  CREATININE 1.34*  CALCIUM 8.8*    Intake/Output Summary (Last 24 hours) at 03/20/2019 0918 Last data filed at 03/20/2019 0459 Gross per 24 hour  Intake 150 ml  Output 125 ml  Net 25 ml     Physical Exam: Vital Signs Blood pressure (!) 172/70, pulse 62, temperature 98 F (36.7 C), resp. rate 18, height 5\' 1"  (1.549 m), weight 82.4 kg, SpO2 93 %. Constitutional: NAD.  Vital signs reviewed. HENT: Normocephalic periorbital ecchymosis improving, right greater than left Eyes: EOMI.  No discharge. Cardiovascular: No JVD    Respiratory: Normal effort    GI: non-distended  Musculoskeletal: Bilateral lower extremity edema Neurological: She is alertand oriented, except for date of month Motor:  Bilateral lower extremities grossly 4/5 proximal to distal Ext- 1+ bilateral pre tib edema, stasis dermatitis changes with flaking bilaterally  Skin: Skin iswarmand  dry. Psychiatric: Pleasant and cooperative  Assessment/Plan: 1. Functional deficits secondary to rhabdomyolysis, debility which require 3+ hours per day of interdisciplinary therapy in a comprehensive inpatient rehab setting.  Physiatrist is providing close team supervision and 24 hour management of active medical problems listed below.  Physiatrist and rehab team continue to assess barriers to discharge/monitor patient progress toward functional and medical goals  Care Tool:  Bathing    Body parts bathed by patient: Right arm, Left arm, Chest, Abdomen, Face, Right upper leg, Left upper leg, Front perineal area   Body parts bathed by helper: Buttocks, Right lower leg, Left lower leg     Bathing assist Assist Level: Moderate Assistance - Patient 50 - 74%     Upper Body Dressing/Undressing Upper body dressing   What is the patient wearing?: Button up shirt    Upper body assist Assist Level: Minimal Assistance - Patient > 75%    Lower Body Dressing/Undressing Lower body dressing      What is the patient wearing?: Pants, Incontinence brief     Lower body assist Assist for lower body dressing: Minimal Assistance - Patient > 75%     Toileting Toileting    Toileting assist Assist for toileting: Minimal Assistance - Patient > 75%     Transfers Chair/bed transfer  Transfers assist  Chair/bed transfer activity did not occur: Safety/medical concerns  Chair/bed transfer assist level: Minimal Assistance - Patient > 75%     Locomotion Ambulation   Ambulation assist      Assist level: Contact Guard/Touching assist Assistive  device: Walker-rolling Max distance: 75'   Walk 10 feet activity   Assist     Assist level: Contact Guard/Touching assist Assistive device: Walker-rolling   Walk 50 feet activity   Assist Walk 50 feet with 2 turns activity did not occur: Safety/medical concerns  Assist level: Contact Guard/Touching assist Assistive device:  Walker-rolling    Walk 150 feet activity   Assist Walk 150 feet activity did not occur: Safety/medical concerns  Assist level: Contact Guard/Touching assist Assistive device: Walker-rolling    Walk 10 feet on uneven surface  activity   Assist Walk 10 feet on uneven surfaces activity did not occur: Safety/medical concerns         Wheelchair     Assist Will patient use wheelchair at discharge?: No             Wheelchair 50 feet with 2 turns activity    Assist            Wheelchair 150 feet activity     Assist          Medical Problem List and Plan: 1.Functional mobility deficitssecondary to rhabdomyolysis, pneumonia, ETOH abuse.  Team conference today  Notes reviewed multiple medical debility, labs reviewed 2. Antithrombotics: -DVT/anticoagulation:Pharmaceutical:Lovenox -antiplatelet therapy: ASA 3. Pain Management:tylenol prn. 4. Mood:team providing ego support  Neuropsych eval  -baseline anxiety present but seems to be managing fairly well at present -antipsychotic agents:  Seroquel and librium(used 10 mg bid for anxiety at home). 5. Neuropsych: This patientis?  Notfully capable of making decisions on herown behalf. 6. Skin/Wound Care:Routine pressure relief measures. 7. Fluids/Electrolytes/Nutrition:encourage PO, eating well as of late 8. Aspiration PNA?:transitioned toAugment fora week ofantibiotic course to be completed today. 9. AKI with mild rhabdomyolysis:    -pushing fluids  -Cr stable/decr to 1.23 on 4/3  Labs ordered for tomorrow 10.Chronic diastolic CHF: Daily weights  -elevated-chest x-ray showed small pleural effusions Filed Weights   03/17/19 0513 03/18/19 0448 03/19/19 0415  Weight: 80.7 kg 80.5 kg 82.4 kg    increased on 4/6, add lasix, increased dose to 40 mg/day 11. H/o anxiety/depression: Lost husband 12/19.Managed by Dr. Vilma Meckel and  librium. 12. Abnormal LFTs:normal 4/1 13. Fall risk: Continue high low bed.  14.  Constipation   Improving  -continue probiotic  -sorbitol prn 15.  Essential hypertension  Hydralazine 50 3 times daily, increased to 75 3 times daily on 4/4  Continue Toprol75 daily   Vitals:   03/20/19 0530 03/20/19 0537  BP: (!) 172/70 (!) 172/70  Pulse:  62  Resp:  18  Temp:    SpO2:  93%  elevated , given  Peripheral edema will add low dose diuretic 16.  Acute blood loss anemia  Hemoglobin 10.6 on 4/1  Continue to monitor 17.  Thrombocytopenia  Platelets 115 on 4/1 Improved to 142 on 4/6    LOS: 7 days A FACE TO [place] E Lily Velasquez 03/20/2019, 9:18 AM

## 2019-03-20 NOTE — Progress Notes (Signed)
Physical Therapy Session Note  Patient Details  Name: [name] [name] MRN: [id number] Date of Birth: Dec 18, 1935  Today's Date: 03/20/2019 PT Individual Time: 1445-1530 PT Individual Time Calculation (min): 45 min   Short Term Goals: Week 1:  PT Short Term Goal 1 (Week 1): Pt will complete least restrictive transfers with min A consistently PT Short Term Goal 2 (Week 1): Pt will ambulate x 25 ft with LRAD and min A  Skilled Therapeutic Interventions/Progress Updates:     Patient in w/c upon PT arrival. Patient alert and agreeable to PT session. Patient reported B hands shaking since this morning and stated "I am just not having a good day" at beginning of session. Patient reported increased fatigue and swelling in B LE's over the past few days, she reports that the swelling has improved today, PT observed moderate swelling in B ankles and feet, however, she feels much more fatigued. BP elevated during session, RN aware, please see vitals below. Patient voided on the [facility] in the bathroom during session, RN aware, and required total A for LB dressing and peri-care. Limited session to patient's room with several rest breaks to provide patient education due to increased fatigue.   Therapeutic Activity: Bed Mobility: Patient performed sit to supine with mod A for LE managment. Provided verbal cues for repositioning once lying in bed. Transfers: Patient performed sit to/from stand x3 with min A using a RW for physical support and balance. Patient performed stand pivot transfers to and from the [facility], during the stand pivot transfer to the [facility] patient had difficulty taking steps and maneuvering the RW requiring increased time and physical assistance for moving the RW. During second trial, patient performed the transfer without hesitation or difficulty with CGA for balance and safety. Provided verbal cues for pushing up from a chair or BSC with 1 UE and reaching back before sitting.  Gait Training:  Patient  ambulated 5 feet using RW with min A for physical support and balance before requesting to sit down due to increased fatigue. Ambulated with decreased step length, increased time to take each step with significantly decreased gait speed, forward trunk lean, downward head gaze, and B knee flexion in stance.   Patient in bed at end of session with breaks locked, with B LE elevated, bed alarm set, and all needs within reach. Educated patient on elevating B LE's to reduce swelling and looking out for signs and symptoms of stroke due to elevated BP using teach-back method with patient able to state 5/6 signs and symptoms without cueing, also educated on symptoms of pneumonia and encouraged sitting upright, performing incentive spirometry, and activity throughout the day.   Therapy Documentation Precautions:  Precautions Precautions: Fall Restrictions Weight Bearing Restrictions: No Vital Signs: Sitting at beginning of session: BP: 168/59 HR: 59 O2: 97% Supine at end of session: BP:171/63 HR: 61 O2: 96% Pain: Pain Assessment Pain Scale: 0-10 Pain Score: 7  Pain Type: Chronic pain Pain Location: Finger (Comment which one)(R thumb and index finger ) Pain Orientation: Right Pain Descriptors / Indicators: Aching;Sore Pain Onset: On-going Patients Stated Pain Goal: 0 Pain Intervention(s): Medication (See eMAR) Multiple Pain Sites: No  Therapy/Group: Individual Therapy  Doreene Burke, PT, DPT 03/20/2019, 4:20 PM

## 2019-03-20 NOTE — Patient Care Conference (Signed)
Inpatient RehabilitationTeam Conference and Plan of Care Update Date: 03/20/2019   Time: 10:15 AM    Patient Name: [name] [name]      Medical Record Number: [id number]  Date of Birth: 20-Oct-1936 Sex: Female         Room/Bed: 4W12C/4W12C-01 Payor Info: Payor: MEDICARE / Plan: MEDICARE PART A AND B / Product Type: *No Product type* /    Admitting Diagnosis: debility fall aki  Admit Date/Time:  03/13/2019  5:26 PM Admission Comments: No comment available   Primary Diagnosis:  <principal problem not specified> Principal Problem: <principal problem not specified>  Patient Active Problem List   Diagnosis Date Noted  . Aspiration pneumonia ([place])   . Acute blood loss anemia   . Chronic diastolic congestive heart failure ([place])   . Debility 03/13/2019  . Hypothermia 03/09/2019  . Elevated liver enzymes 03/09/2019  . Thrombocytopenia ([place]) 03/09/2019  . Hyperkalemia 07/23/2017  . AKI (acute kidney injury) ([place]) 07/23/2017  . 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 Fall 06/26/2017  . Intractable nausea and vomiting 03/11/2017  . Acute lower UTI 03/11/2017  . Carotid stenosis 12/28/2016  . Sedative, hypnotic, or anxiolytic withdrawal delirium 11/15/2016  . Paroxysmal atrial fibrillation ([facility])   . Cerebrovascular disease   . Fever   . Acute encephalopathy 11/10/2016  . Hypothyroidism 11/10/2016  . Hypertensive urgency 11/10/2016  . Altered mental state 11/10/2016  . Seizure ([place]) 11/10/2016  . Chronic diastolic CHF (congestive heart failure) ([place])   . Left carotid stenosis 10/29/2016  . S/P total knee replacement 03/10/2016  . 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 Ileus ([place]) 09/05/2014  . Leukocytosis 09/05/2014  . Vomiting 09/04/2014  . Pulmonary edema 09/04/2014  . Hypokalemia 08/28/2014  . Enteritis due to Clostridium difficile 08/27/2014  . 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 Protein calorie malnutrition ([place]) 08/24/2014  .  Obstructive jaundice 08/23/2014  . Acute cholangitis 08/23/2014  . ARF (acute renal failure) ([place]) 08/23/2014  . Coagulopathy ([place]) 08/23/2014  . Back pain   . Arthritis   . Hypertension   . GERD (gastroesophageal reflux disease)   . Anxiety   . IBS (irritable bowel syndrome)   . Hernia of Right posterior flank 04/18/2012    Expected Discharge Date: Expected Discharge Date: 03/27/19  Team Members Present: Physician leading conference: Dr. Delice Lesch Social Worker Present: Lennart Pall, LCSW Nurse Present: Rayetta Humphrey, RN PT Present: Canary Brim, PT OT Present: Amy Rounds, OT SLP Present: Windell Moulding, SLP PPS Coordinator present : Gunnar Fusi     Current Status/Progress Goal Weekly Team Focus  Medical   exacerbation of CHF with weight gain  maintain euvolemic state  reduce fluid overload, avoid hypokalemia and pre renal azotemia   Bowel/Bladder   Continent of bowel/bladder; LBM 03/18/19  Maintain regular bowel/bladder function with supervision  Assess bowel/bladder function qshift and as needed   Swallow/Nutrition/ Hydration             ADL's   CGA functional transfers with RW; Mod-max A LB bathing/dressing using AE; Min A UB bathing/dressing; Mod A toileting  Set at supervision overall, may downgrade pending pt progress  Functional activity tolerance, ADL re-training, functional transfers and mobility   Mobility   min A transfers with RW, CGA gait 75-160' depending on fatigue level  Supervision overall, min A stairs  endurance, strengthening, gait   Communication             Safety/Cognition/ Behavioral  Observations  min assist-supervision   supervision   continue to address higher level deficits   Pain   complains of pain; Tylenol PRN on-board  <2  Assess and treat pain q shift and as needed   Skin   Generalzed edema, MASD to the groin Butt cream on-board  Maintain skin integrity with min assist  Assess skin q shift and as needed    Rehab Goals Patient on target to  meet rehab goals: Yes *See Care Plan and progress notes for long and short-term goals.     Barriers to Discharge  Current Status/Progress Possible Resolutions Date Resolved   Physician    Medical stability     progressing toward goals diuresing  See above      Nursing                  PT                    OT                  SLP                SW                Discharge Planning/Teaching Needs:  Pt to d/c home with daughter to stay and provide 24/7 supervision.  Teaching to be planned closer to d/c.   Team Discussion:  Med adjustments;  BP up;  Platelets improved.  C/o increased pain today. Mod assist to sitting EOB.  Min assist tfs and amb CGA.  Supervision goals overall except min for LB care and steps.  Min assist goals cognition.  Revisions to Treatment Plan:  NA    Continued Need for Acute Rehabilitation Level of Care: The patient requires daily medical management by a physician with specialized training in physical medicine and rehabilitation for the following conditions: Daily direction of a multidisciplinary physical rehabilitation program to ensure safe treatment while eliciting the highest outcome that is of practical value to the patient.: Yes Daily medical management of patient stability for increased activity during participation in an intensive rehabilitation regime.: Yes Daily analysis of laboratory values and/or radiology reports with any subsequent need for medication adjustment of medical intervention for : Neurological problems   I attest that I was present, lead the team conference, and concur with the assessment and plan of the team.   Lennart Pall 03/21/2019, 9:09 AM   Team conference was held via web/ teleconference due to [place] - 19

## 2019-03-20 NOTE — Progress Notes (Signed)
Physical Therapy Session Note  Patient Details  Name: [name] [name] MRN: [id number] Date of Birth: 05/12/1936  Today's Date: 03/20/2019 PT Individual Time: 0071-2197 PT Individual Time Calculation (min): 20 min   Short Term Goals: Week 1:  PT Short Term Goal 1 (Week 1): Pt will complete least restrictive transfers with min A consistently PT Short Term Goal 2 (Week 1): Pt will ambulate x 25 ft with LRAD and min A  Skilled Therapeutic Interventions/Progress Updates:    patient received up in [facility], very pleasant and willing to participate in therapy. Able to complete sit to stands multiple times from [facility] with RW and cues for safety and sequencing for donning clothing which was donned with maxA due to pain in finger and difficulty in grasping items. Session limited due to care provided by MD and RN this morning. She was left up in her WC with seat belt alarm active, all needs otherwise met. 10 minutes of skilled therapy care missed due to MD/RN care.   Therapy Documentation Precautions:  Precautions Precautions: Fall Restrictions Weight Bearing Restrictions: No General: PT Amount of Missed Time (min): 10 Minutes PT Missed Treatment Reason: Other (Comment)(MD/RN care ) Vital Signs:  Pain: Pain Assessment Pain Scale: 0-10 Pain Score: 7  Pain Type: Chronic pain Pain Location: Finger (Comment which one)(R thumb and index finger ) Pain Orientation: Right Pain Descriptors / Indicators: Aching;Sore Pain Onset: On-going Patients Stated Pain Goal: 0 Pain Intervention(s): Medication (See eMAR) Multiple Pain Sites: No    Therapy/Group: Individual Therapy   Deniece Ree PT, DPT, CBIS  Supplemental Physical Therapist [facility]    Pager [phone] Acute Rehab Office [phone]    03/20/2019, 1:18 PM

## 2019-03-20 NOTE — Progress Notes (Signed)
Speech Language Pathology Daily Session Note  Patient Details  Name: [name] [name] MRN: [id number] Date of Birth: 1936/06/30  Today's Date: 03/20/2019 SLP Individual Time: 0804-0900 SLP Individual Time Calculation (min): 56 min  Short Term Goals: Week 1: SLP Short Term Goal 1 (Week 1): Pt will complete mildly complex tasks with supervision cues for functional problem solving.   SLP Short Term Goal 2 (Week 1): Pt will recall complex, daily information with supervision cues for use of external aids.   SLP Short Term Goal 3 (Week 1): Pt will anticipate barriers to discharge and generate appropriate solutions with supervision cues.    Skilled Therapeutic Interventions:  Pt was seen for skilled ST targeting cognitive goals.  Upon arrival, pt was in bed, finishing breakfast and had complaints of difficulty breathing.  She states she had already informed MD.  Chest x ray yesterday reports findings of "bibasilar atelectasis, can not exclude infiltrate at the left lung base, small bilateral pleural effusions."  Pt agreeable to getting out of bed.  Transferred to bedside commode where she was continent of bowel and bladder.  Pt appeared slightly more hesitant with mobility today in comparison to previous therapy sessions due to concerns about her breathing.  Work of breathing subjectively only appeared mildly increased but no other indicators of significant respiratory distress were evident or limiting in therapy.  SLP facilitated the session with a novel card game targeting problem solving and recall of new information.  Pt was able to plan and execute a problem solving strategy within task with distant supervision instructional cues.  Pt reports feeling better at the end of therapy and states that getting out of bed helps with her breathing.  Discussed with RN.  Continue per current plan of care.    Pain Pain Assessment Pain Scale: 0-10 Pain Score: 7  Pain Type: Chronic pain Pain Location: Finger  (Comment which one)(index) Pain Orientation: Right Pain Descriptors / Indicators: Aching Pain Intervention(s): RN made aware  Therapy/Group: Individual Therapy  Caydence Koenig, Selinda Orion 03/20/2019, 10:32 AM

## 2019-03-21 ENCOUNTER — Inpatient Hospital Stay (HOSPITAL_COMMUNITY): Payer: Self-pay | Admitting: Physical Therapy

## 2019-03-21 ENCOUNTER — Inpatient Hospital Stay (HOSPITAL_COMMUNITY): Payer: Self-pay | Admitting: Occupational Therapy

## 2019-03-21 ENCOUNTER — Inpatient Hospital Stay (HOSPITAL_COMMUNITY): Payer: Self-pay | Admitting: Speech Pathology

## 2019-03-21 NOTE — Progress Notes (Signed)
Physical Therapy Weekly Progress Note  Patient Details  Name: [name] [name] MRN: [id number] Date of Birth: 1935/12/18  Beginning of progress report period: March 14, 2019 End of progress report period: March 21, 2019  Today's Date: 03/21/2019 PT Individual Time: 4401-0272 PT Individual Time Calculation (min): 55 min   Patient has met 2 of 2 short term goals.  Pt continues to make slow but steady progress towards PT goals. Pt is able to complete transfers with min A consisently and ambulate 75-160 ft with RW and CGA. However, pt continues to be limited by overall deconditioning and increased edema throughout her body. Pt has exhibited decreased overall endurance and tolerance for therapy over the past several days due to increase in edema and fluid leading to increased trouble with breathing. Medical team is aware and is addressing this issue.  Patient continues to demonstrate the following deficits muscle weakness, decreased cardiorespiratoy endurance and decreased standing balance, decreased postural control and decreased balance strategies and therefore will continue to benefit from skilled PT intervention to increase functional independence with mobility.  Patient progressing toward long term goals..  Continue plan of care.  PT Short Term Goals Week 1:  PT Short Term Goal 1 (Week 1): Pt will complete least restrictive transfers with min A consistently PT Short Term Goal 1 - Progress (Week 1): Met PT Short Term Goal 2 (Week 1): Pt will ambulate x 25 ft with LRAD and min A PT Short Term Goal 2 - Progress (Week 1): Met Week 2:  PT Short Term Goal 1 (Week 2): =LTG due to ELOS  Skilled Therapeutic Interventions/Progress Updates:  Pt received seated in bed, agreeable to bed level therapy. Pt declines to get out of bed this session due to fatigue and shortness of breath. Vitals WNL. Pt reports 6/10 pain in R mid-back region. Provided hot pack to back for pain relief, pt reports decrease in pain  with use of heat. Education with patient about changing positions in the bed frequently as she reports she frequently sleeps just on her back and not on her side. Demonstrated LTR for back stretch, pt able to perform with minor cueing. Provided handout of BLE strengthening therex to review with patient: heel slides, SLR, SAQ, hip abd, hip add squeeze, bridges x 10 reps each. Pt requires cueing to perform exercises correctly, will continue to review. Pt is min A to scoot up in bed. SpO2 91% following activity on room air. Pt left semi-reclined in bed with needs in reach, bed alarm in place.  Therapy Documentation Precautions:  Precautions Precautions: Fall Restrictions Weight Bearing Restrictions: No   Therapy/Group: Individual Therapy   Excell Seltzer, PT, DPT  03/21/2019, 3:38 PM

## 2019-03-21 NOTE — Progress Notes (Signed)
Speech Language Pathology Weekly Progress and Session Note  Patient Details  Name: [name] [name] MRN: [id number] Date of Birth: [birth year]  Beginning of progress report period:  February 11, 2019 End of progress report period: February 18, 2019  Today's Date: 03/21/2019 SLP Individual Time: 1110-1155 SLP Individual Time Calculation (min): 45 min  Short Term Goals: Week 1: SLP Short Term Goal 1 (Week 1): Pt will complete mildly complex tasks with supervision cues for functional problem solving.   SLP Short Term Goal 1 - Progress (Week 1): Met SLP Short Term Goal 2 (Week 1): Pt will recall complex, daily information with supervision cues for use of external aids.   SLP Short Term Goal 2 - Progress (Week 1): Met SLP Short Term Goal 3 (Week 1): Pt will anticipate barriers to discharge and generate appropriate solutions with supervision cues.   SLP Short Term Goal 3 - Progress (Week 1): Progressing toward goal    New Short Term Goals: Week 2: SLP Short Term Goal 1 (Week 2): STG=LTG due to remaining length of stay   Weekly Progress Updates:   Pt has made functional gains this reporting period and has met 2 out of 3 short term goals.  Pt is currently min assist-supervision for tasks due to fluctuating cognitive status.  At times pt can be mod I-supervision for tasks but today needed up to max assist for tasks due to complaints of fatigue and increased work of breathing.   Pt education is ongoing.  Pt would continue to benefit from skilled ST while inpatient in order to maximize functional independence and reduce burden of care prior to discharge.  Anticipate that pt will need 24/7 supervision at discharge.     Intensity: Minumum of 1-2 x/day, 30 to 90 minutes Frequency: 3 to 5 out of 7 days Duration/Length of Stay: 14-17 days  Treatment/Interventions: Cognitive remediation/compensation;Cueing hierarchy;Internal/external aids;Patient/family education;Functional tasks   Daily Session  Skilled  Therapeutic Interventions: Pt was seen for skilled ST targeting cognitive goals.  Upon arrival, pt was noted with slower processing in comparison to previous sessions and pt subjectively reporting that her "mind was not right" today.  Pt reports shortness of breath and increased tremors which are causing her concern.  At rest her respiratory rate appeared to be [facility] and she showed no other signs of desaturation.  Pt was agreeable to participating in [place] and SLP facilitated the session with a novel, mildly complex deductive reasoning puzzle to address memory and problem solving goals.  Pt needed max assist to complete task due to poor attention to task and resultant decrease in recall of task procedures.  Pt visibly frustrated by her perceived difficulty with task and requested to end therapy session early.  As a result, pt missed 15 min of scheduled speech therapy session. Discussed change in function with RN.  Goals updated on this date to reflect current progress and plan of care.      General    Pain Pain Assessment Pain Scale: 0-10 Pain Score: 0-No pain  Therapy/Group: Individual Therapy  Eylin Pontarelli, Selinda Orion 03/21/2019, 2:17 PM

## 2019-03-21 NOTE — Progress Notes (Signed)
Social Work Patient ID: [name] [name], female   DOB: [birth year], 83 y.o.   MRN: [id number]   Have reviewed team conference with pt and daughter, [name] [name].  Both aware that team continues to plan toward target d/c date of 4/14 and supervision - min assist goals.  Daughter without any concerns.  Pt reports not feeling well today and, per tx notes, not doing as well in sessions today either.  Will monitor progress and discuss with team if pt appearing that she won't reach goals.    Brei Pociask, LCSW

## 2019-03-21 NOTE — Plan of Care (Signed)
Meal prep goal d/c at this time. Beonka Amesquita, OTR/L

## 2019-03-21 NOTE — Progress Notes (Signed)
[place] PHYSICAL MEDICINE & REHABILITATION PROGRESS NOTE   Subjective/Complaints:  SOB when flat, pt has been urinating frequently  ROS: Denies CP, SOB, N/V/D  Objective:   Dg Chest 2 View  Result Date: 03/19/2019 CLINICAL DATA:  Shortness of breath. EXAM: CHEST - 2 VIEW COMPARISON:  03/11/2019 FINDINGS: The heart size and mediastinal contours are within normal limits. The lung volumes are low bilaterally with bibasilar atelectasis present, left greater than right. Cannot exclude subtle infiltrate at the left lung base. There are tiny bilateral pleural effusions. The visualized skeletal structures are unremarkable. IMPRESSION: Bibasilar atelectasis. Cannot exclude infiltrate at the left lung base. Small bilateral pleural effusions. Electronically Signed   By: Aletta Edouard M.D.   On: 03/19/2019 09:35   Recent Labs    03/19/19 0352  WBC 7.3  HGB 10.6*  HCT 33.0*  PLT 142*   Recent Labs    03/19/19 0352  NA 140  K 4.1  CL 110  CO2 24  GLUCOSE 119*  BUN 21  CREATININE 1.34*  CALCIUM 8.8*    Intake/Output Summary (Last 24 hours) at 03/21/2019 0843 Last data filed at 03/20/2019 2153 Gross per 24 hour  Intake 440 ml  Output 500 ml  Net -60 ml     Physical Exam: Vital Signs Blood pressure (!) 149/63, pulse (!) 59, temperature (!) 93.5 F (34.2 C), temperature source Oral, resp. rate 18, height 5\' 1"  (1.549 m), weight 84.9 kg, SpO2 92 %. Constitutional: NAD.  Vital signs reviewed. HENT: Normocephalic periorbital ecchymosis improving, right greater than left Eyes: EOMI.  No discharge. Cardiovascular: No JVD    Respiratory: Normal effort    GI: non-distended  Musculoskeletal: Bilateral lower extremity edema Neurological: She is alertand oriented, except for date of month Motor:  Bilateral lower extremities grossly 4/5 proximal to distal Ext- 1+ bilateral pre tib edema, stasis dermatitis changes with flaking bilaterally  Skin: Skin iswarmand dry. Psychiatric:  Pleasant and cooperative  Assessment/Plan: 1. Functional deficits secondary to rhabdomyolysis, debility which require 3+ hours per day of interdisciplinary therapy in a comprehensive inpatient rehab setting.  Physiatrist is providing close team supervision and 24 hour management of active medical problems listed below.  Physiatrist and rehab team continue to assess barriers to discharge/monitor patient progress toward functional and medical goals  Care Tool:  Bathing    Body parts bathed by patient: Right arm, Left arm, Chest, Abdomen, Face, Right upper leg, Left upper leg, Front perineal area   Body parts bathed by helper: Buttocks, Right lower leg, Left lower leg     Bathing assist Assist Level: Moderate Assistance - Patient 50 - 74%     Upper Body Dressing/Undressing Upper body dressing   What is the patient wearing?: Button up shirt    Upper body assist Assist Level: Minimal Assistance - Patient > 75%    Lower Body Dressing/Undressing Lower body dressing      What is the patient wearing?: Pants, Incontinence brief     Lower body assist Assist for lower body dressing: Minimal Assistance - Patient > 75%     Toileting Toileting    Toileting assist Assist for toileting: Minimal Assistance - Patient > 75%     Transfers Chair/bed transfer  Transfers assist  Chair/bed transfer activity did not occur: Safety/medical concerns  Chair/bed transfer assist level: Minimal Assistance - Patient > 75%     Locomotion Ambulation   Ambulation assist      Assist level: Minimal Assistance - Patient > 75%(limited by fatigue)  Assistive device: Walker-rolling Max distance: 5'   Walk 10 feet activity   Assist     Assist level: Contact Guard/Touching assist Assistive device: Walker-rolling   Walk 50 feet activity   Assist Walk 50 feet with 2 turns activity did not occur: Safety/medical concerns  Assist level: Contact Guard/Touching assist Assistive device:  Walker-rolling    Walk 150 feet activity   Assist Walk 150 feet activity did not occur: Safety/medical concerns  Assist level: Contact Guard/Touching assist Assistive device: Walker-rolling    Walk 10 feet on uneven surface  activity   Assist Walk 10 feet on uneven surfaces activity did not occur: Safety/medical concerns         Wheelchair     Assist Will patient use wheelchair at discharge?: No             Wheelchair 50 feet with 2 turns activity    Assist            Wheelchair 150 feet activity     Assist          Medical Problem List and Plan: 1.Functional mobility deficitssecondary to rhabdomyolysis, pneumonia, ETOH abuse.  Cont CIR PT, OT  Notes reviewed multiple medical debility, labs reviewed 2. Antithrombotics: -DVT/anticoagulation:Pharmaceutical:Lovenox -antiplatelet therapy: ASA 3. Pain Management:tylenol prn. 4. Mood:team providing ego support  Neuropsych eval  -baseline anxiety present but seems to be managing fairly well at present -antipsychotic agents:  Seroquel and librium(used 10 mg bid for anxiety at home). 5. Neuropsych: This patientis?  Notfully capable of making decisions on herown behalf. 6. Skin/Wound Care:Routine pressure relief measures. 7. Fluids/Electrolytes/Nutrition:encourage PO, eating well as of late 8. SOB- orthopnea due to diastolic CHF, diuresis 9. AKI with mild rhabdomyolysis:    -pushing fluids  -Cr stable/decr to 1.23 on 4/3  Labs ordered for 4/9 10.Chronic diastolic CHF: Daily weights  -elevated-chest x-ray showed small pleural effusions Filed Weights   03/19/19 0415 03/21/19 0441 03/21/19 0500  Weight: 82.4 kg 80.9 kg 84.9 kg    Lasix 20mg  on 4/6, 40mg  4/7 an d4/8, ? Accuracy of 2 weight reading 4/8 differ by 4kg? 11. H/o anxiety/depression: Lost husband 12/19.Managed by Dr. Vilma Meckel and librium. 12. Abnormal LFTs:normal  4/1 13. Fall risk: Continue high low bed.  14.  Constipation   Improving  -continue probiotic  -sorbitol prn 15.  Essential hypertension  Hydralazine 50 3 times daily, increased to 75 3 times daily on 4/4  Continue Toprol 75 daily   Vitals:   03/20/19 1921 03/21/19 0441  BP: (!) 161/66 (!) 149/63  Pulse: (!) 55 (!) 59  Resp: 19 18  Temp:    SpO2: 94% 92%  elevated , given  Peripheral edema will add low dose diuretic 16.  Acute blood loss anemia  Hemoglobin 10.6 on 4/1  Continue to monitor 17.  Thrombocytopenia  Platelets 115 on 4/1 Improved to 142 on 4/6    LOS: 8 days A FACE TO [place]rm[place] E Kirsteins 03/21/2019, 8:43 AM

## 2019-03-21 NOTE — Progress Notes (Signed)
Occupational Therapy Weekly Progress Note  Patient Details  Name: [name] [name] MRN: [id number] Date of Birth: 02/08/1936  Beginning of progress report period: March 14, 2019 End of progress report period: March 21, 2019  Today's Date: 03/21/2019 OT Individual Time: 6195-0932 OT Individual Time Calculation (min): 60 min    Patient has met 4 of 4 short term goals.  Pt is making slow but steady progress towards OT goals. She cont to be most limited by generalized weakness/deconditioning, increased edema throughout entire body, and "flare up" of arthritis much makes movement more difficult and impairs AROM and functional use of B hands. Pt remains very motivated to return to PLOF and cont to increase independence with ADLs. She is CGA-min A overall for functional mobility using RW. On going education and practice regarding use of AE for LB bathing/dressing for increased independence.   Patient continues to demonstrate the following deficits: acute pain, cognitive deficits, muscular wasting and disuse atrophy and muscle weakness (generalized) and therefore will continue to benefit from skilled OT intervention to enhance overall performance with BADL and Reduce care partner burden.  Patient progressing toward long term goals..  Plan of care revisions: homemaking goal of simple meal prep has been d/c at this time. Unable to perform cooking tasks in the kitchen at this time due to COVID-19 concerns. Will complete kitchen mobility and education while on IPR, however, will not have opportunity to complete full cooking activity. Pt reports her daughter will assist with all meal prep at d/c.   OT Short Term Goals Week 1:  OT Short Term Goal 1 (Week 1): Pt will complete bathing with min assist to demonstrate increased activity tolerance OT Short Term Goal 1 - Progress (Week 1): Met OT Short Term Goal 2 (Week 1): Pt will complete LB dressing (except footwear) with Min assist OT Short Term Goal 2 - Progress  (Week 1): Met OT Short Term Goal 3 (Week 1): Pt will complete 2 grooming tasks in standing with CGA to demonstrate increased activity tolerance OT Short Term Goal 3 - Progress (Week 1): Met OT Short Term Goal 4 (Week 1): Pt will complete toilet transfer with min assist with LRAD OT Short Term Goal 4 - Progress (Week 1): Met Week 2:  OT Short Term Goal 1 (Week 2): STG=LTG due to LOS  Skilled Therapeutic Interventions/Progress Updates:    Pt seen for OT ADL session. Pt sitting upright in bed upon arrival with LPN present administering AM meds including pain meds, pt denying pain at rest. She requested to use bathroom, however, did not believe she had time to make it walking into bathroom therefore used BSC. Mod A to come sitting EOB using hospital bed functions and therapist assisting with use of chuck pads and multi-modal cuing.  Completed functional transfers via stand pivot with overall CGA, however, requiring significantly increased time with small deliberate movements and pt shaking throughout. Total A for toileting task following continent B/B void.  She requested to return to EOB to finish breakfast, stand pivot back to EOB in same manner as described above. Pt with difficulty self feeding due to remor like movements and difficulty maintaining functional grasp on utensil. Pt reports arthritis flare up. Provided with built up gripper on utensil which had minimal affect.  She dressed seated on EOB, assist to pull around button down shirt and assist to fasten. Pt able to recall use of reacher for LB dressing tasks and able to use to doff B socks and thread  pants with increased time. Pt required rest breaks throughout seated ADL session, pt complaints of SOB, O2 levels remaining >93% on RA throughout activity. With min cuing, pt able to recall use of sock aid, however, required assist to thread sock onto sock aid 2/2 arthritis related weakness in B hands. Pt beginning to require min A for sitting balance  EOB when fatigued due to L lean. She transferred back to w/c at end of session in same manner as described above. Pt left seated in w/c with all needs in reach and chair belt alarm on.  PT and RN made aware of pt's change in functional performance and more generalized weakness over last two days.   Therapy Documentation Precautions:  Precautions Precautions: Fall Restrictions Weight Bearing Restrictions: No   Therapy/Group: Individual Therapy  Gracelin Weisberg L 03/21/2019, 7:01 AM

## 2019-03-21 NOTE — Progress Notes (Signed)
Occupational Therapy Session Note  Patient Details  Name: [name] [name] MRN: [id number] Date of Birth: 05-09-1936  Today's Date: 03/21/2019 OT Individual Time: 1660-6004 OT Individual Time Calculation (min): 24 min    Short Term Goals: Week 2:  OT Short Term Goal 1 (Week 2): STG=LTG due to LOS  Skilled Therapeutic Interventions/Progress Updates:    Pt resting in bed upon arrival.  Pt stated she just returned to bed.  Pt also stated she was not having a good day wit her breathing.  O2 sats per below.  Pt declined getting OOB since she just returned to bed but did agree to sitting EOB.  Discussed AE with pt and showed her handout with information to order from [employer].  Pt stated she was waiting for lunch.  Pt remained in bed with all needs within reach and bed alarm activated.    Therapy Documentation Precautions:  Precautions Precautions: Fall Restrictions Weight Bearing Restrictions: No   Vital Signs: Therapy Vitals Pulse Rate: (!) 55 Oxygen Therapy SpO2: 94 % O2 Device: Room Air Pain:  Pt denies pain this afternoon   Therapy/Group: Individual Therapy  Leroy Libman 03/21/2019, 12:55 PM

## 2019-03-22 ENCOUNTER — Inpatient Hospital Stay (HOSPITAL_COMMUNITY): Payer: Self-pay | Admitting: Speech Pathology

## 2019-03-22 ENCOUNTER — Inpatient Hospital Stay (HOSPITAL_COMMUNITY): Payer: Self-pay | Admitting: Physical Therapy

## 2019-03-22 ENCOUNTER — Inpatient Hospital Stay (HOSPITAL_COMMUNITY): Payer: Self-pay | Admitting: Occupational Therapy

## 2019-03-22 LAB — BASIC METABOLIC PANEL
Anion gap: 11 (ref 5–15)
BUN: 28 mg/dL — ABNORMAL HIGH (ref 8–23)
CO2: 28 mmol/L (ref 22–32)
Calcium: 9.1 mg/dL (ref 8.9–10.3)
Chloride: 106 mmol/L (ref 98–111)
Creatinine, Ser: 1.28 mg/dL — ABNORMAL HIGH (ref 0.44–1.00)
GFR calc Af Amer: 46 mL/min — ABNORMAL LOW (ref >60)
GFR calc non Af Amer: 39 mL/min — ABNORMAL LOW (ref >60)
Glucose, Bld: 123 mg/dL — ABNORMAL HIGH (ref 70–99)
Potassium: 3.6 mmol/L (ref 3.5–5.1)
Sodium: 145 mmol/L (ref 135–145)

## 2019-03-22 NOTE — Progress Notes (Addendum)
Speech Language Pathology Daily Session Note  Patient Details  Name: AUBRYNN KATONA MRN: [id number] Date of Birth: 04/04/1936  Today's Date: 03/22/2019 SLP Individual Time: 1455-1525 SLP Individual Time Calculation (min): 30 min  Short Term Goals: Week 2: SLP Short Term Goal 1 (Week 2): STG=LTG due to remaining length of stay   Skilled Therapeutic Interventions:  Pt was seen for skilled ST targeting cognitive goals.  SLP facilitated the session with a novel scheduling task targeting goals for problem solving. Pt needed min faded to supervision cues for attention to detail and task organization.  Pt was left in chair with chair alarm set and all needs within reach.  Continue per current plan of care.    Pain Pain Assessment Pain Scale: 0-10 Pain Score: 0-No pain  Therapy/Group: Individual Therapy  Dreden Rivere, Selinda Orion 03/22/2019, 3:25 PM

## 2019-03-22 NOTE — Progress Notes (Signed)
[facility] PHYSICAL MEDICINE & REHABILITATION PROGRESS NOTE   Subjective/Complaints:  Breathing better kept HOB elevated last noc ROS: Denies CP, SOB, N/V/D  Objective:   No results found. No results for input(s): WBC, HGB, HCT, PLT in the last 72 hours. Recent Labs    03/22/19 0510  NA 145  K 3.6  CL 106  CO2 28  GLUCOSE 123*  BUN 28*  CREATININE 1.28*  CALCIUM 9.1    Intake/Output Summary (Last 24 hours) at 03/22/2019 0749 Last data filed at 03/22/2019 0516 Gross per 24 hour  Intake 360 ml  Output 995 ml  Net -635 ml     Physical Exam: Vital Signs Blood pressure (!) 164/76, pulse 65, temperature (!) 97.5 F (36.4 C), temperature source Oral, resp. rate 16, height 5\' 1"  (1.549 m), weight 78.9 kg, SpO2 92 %. Constitutional: NAD.  Vital signs reviewed. HENT: Normocephalic periorbital ecchymosis improving, right greater than left Eyes: EOMI.  No discharge. Cardiovascular: No JVD    Respiratory: Normal effort    GI: non-distended  Musculoskeletal: Bilateral lower extremity edema Neurological: She is alertand oriented, except for date of month Motor:  Bilateral lower extremities grossly 4/5 proximal to distal Ext- 1+ bilateral pre tib edema, stasis dermatitis changes with flaking bilaterally  Skin: Skin iswarmand dry. Psychiatric: Pleasant and cooperative  Assessment/Plan: 1. Functional deficits secondary to rhabdomyolysis, debility which require 3+ hours per day of interdisciplinary therapy in a comprehensive inpatient rehab setting.  Physiatrist is providing close team supervision and 24 hour management of active medical problems listed below.  Physiatrist and rehab team continue to assess barriers to discharge/monitor patient progress toward functional and medical goals  Care Tool:  Bathing    Body parts bathed by patient: Right arm, Left arm, Chest, Abdomen, Face, Right upper leg, Left upper leg, Front perineal area   Body parts bathed by helper:  Buttocks, Right lower leg, Left lower leg     Bathing assist Assist Level: Moderate Assistance - Patient 50 - 74%     Upper Body Dressing/Undressing Upper body dressing   What is the patient wearing?: Button up shirt    Upper body assist Assist Level: Moderate Assistance - Patient 50 - 74%    Lower Body Dressing/Undressing Lower body dressing      What is the patient wearing?: Pants, Incontinence brief     Lower body assist Assist for lower body dressing: Moderate Assistance - Patient 50 - 74%     Toileting Toileting    Toileting assist Assist for toileting: Maximal Assistance - Patient 25 - 49%     Transfers Chair/bed transfer  Transfers assist  Chair/bed transfer activity did not occur: Safety/medical concerns  Chair/bed transfer assist level: Minimal Assistance - Patient > 75%     Locomotion Ambulation   Ambulation assist      Assist level: Minimal Assistance - Patient > 75%(limited by fatigue) Assistive device: Walker-rolling Max distance: 5'   Walk 10 feet activity   Assist     Assist level: Contact Guard/Touching assist Assistive device: Walker-rolling   Walk 50 feet activity   Assist Walk 50 feet with 2 turns activity did not occur: Safety/medical concerns  Assist level: Contact Guard/Touching assist Assistive device: Walker-rolling    Walk 150 feet activity   Assist Walk 150 feet activity did not occur: Safety/medical concerns  Assist level: Contact Guard/Touching assist Assistive device: Walker-rolling    Walk 10 feet on uneven surface  activity   Assist Walk 10 feet on uneven  surfaces activity did not occur: Safety/medical concerns         Wheelchair     Assist Will patient use wheelchair at discharge?: No             Wheelchair 50 feet with 2 turns activity    Assist            Wheelchair 150 feet activity     Assist          Medical Problem List and Plan: 1.Functional mobility  deficitssecondary to rhabdomyolysis, pneumonia, ETOH abuse.  Cont CIR PT, OT  Notes reviewed multiple medical debility, labs reviewed 2. Antithrombotics: -DVT/anticoagulation:Pharmaceutical:Lovenox -antiplatelet therapy: ASA 3. Pain Management:tylenol prn. 4. Mood:team providing ego support  Neuropsych eval  -baseline anxiety present but seems to be managing fairly well at present -antipsychotic agents:  Seroquel and librium(used 10 mg bid for anxiety at home). 5. Neuropsych: This patientis?  Notfully capable of making decisions on herown behalf. 6. Skin/Wound Care:Routine pressure relief measures. 7. Fluids/Electrolytes/Nutrition:encourage PO, eating well as of late 8. SOB- orthopnea due to diastolic CHF, diuresis 9. AKI with mild rhabdomyolysis:    -pushing fluids  -Cr stable/decr to 1.23 on 4/3 and 4/9, elevated BUN due to lasix   10.Chronic diastolic CHF: with exacerbation now improved   -elevated-chest x-ray showed small pleural effusions Filed Weights   03/21/19 0500 03/22/19 0500 03/22/19 0540  Weight: 84.9 kg 82.5 kg 78.9 kg    Lasix 20mg  on 4/6, 40mg  4/7 an d4/8, ? Accuracy of 2 weight reading 4/8 differ by 4kg?same with 4/9 2 readings within minutes that differ by ~4kg 11. H/o anxiety/depression: Lost husband 12/19.Managed by Dr. Vilma Meckel and librium. 12. Abnormal LFTs:normal 4/1 13. Fall risk: Continue high low bed.  14.  Constipation   Improving  -continue probiotic  -sorbitol prn 15.  Essential hypertension  Hydralazine 50 3 times daily, increased to 75 3 times daily on 4/4  Continue Toprol 75 daily   Vitals:   03/22/19 0516 03/22/19 0540  BP: (!) 164/76   Pulse: 64 65  Resp: 16   Temp:  (!) 97.5 F (36.4 C)  SpO2: (!) 89% 92%  elevated this am check orthostatic vitals 16.  Acute blood loss anemia  Hemoglobin 10.6 on 4/1  Continue to monitor 17.  Thrombocytopenia  Platelets 115 on  4/1 Improved to 142 on 4/6    LOS: 9 days A FACE TO [facility] Therin Vetsch 03/22/2019, 7:49 AM

## 2019-03-22 NOTE — Progress Notes (Signed)
Physical Therapy Session Note  Patient Details  Name: [name] [name] MRN: [id number] Date of Birth: 07-Dec-1936  Today's Date: 03/22/2019 PT Individual Time: 1330-1430 PT Individual Time Calculation (min): 60 min   Short Term Goals: Week 2:  PT Short Term Goal 1 (Week 2): =LTG due to ELOS  Skilled Therapeutic Interventions/Progress Updates:    Pt received seated in w/c in room, agreeable to PT session. Pt reports some pain in R back, not rated. Hot pack to back at end of session for pain management. Sit to stand with min A to RW throughout session. Focus on proper body alignment during transfer for improved balance once standing. Ambulation 4 x 50 ft with RW and min A. Pt exhibits improved endurance and tolerance for gait training this date. Ascend/descend one 4" step with RW and min A for balance, 2 x 3 reps. Static standing balance: static stance with no UE support 2 x 60 sec, static stance with no UE support and multidirectional pertubations with one instance of near LOB posteriorly requiring min A to recover. Toilet transfer with min A and use of RW, min A for clothing management. Pt left seated in w/c in room with needs in reach, quick release belt and chair alarm in place.  Therapy Documentation Precautions:  Precautions Precautions: Fall Restrictions Weight Bearing Restrictions: No    Therapy/Group: Individual Therapy   Excell Seltzer, PT, DPT  03/22/2019, 3:43 PM

## 2019-03-22 NOTE — Progress Notes (Signed)
Occupational Therapy Session Note  Patient Details  Name: [name] [name] MRN: [id number] Date of Birth: [birth year]  Today's Date: 03/22/2019 OT Individual Time: 0930-1030 OT Individual Time Calculation (min): 60 min    Short Term Goals: Week 2:  OT Short Term Goal 1 (Week 2): STG=LTG due to LOS  Skilled Therapeutic Interventions/Progress Updates:    Pt seen for OT ADL bathing/dressing session. Pt sitting up in w/c upon arrival, denied pain and voiced having more energy then previous day. However, throughout session, required increased time and rest breaks throughout seated level ADLs 2/2 decreased functional activity tolerance. VCs for deep breathing techniques.  She ambulated throughout session with CGA using RW. Pt unable to walk up steep incline into bathroom with min-mod A to steady and VCs for reassurance and technique. She returned to w/c and taken total A into bathroom via w/c. Min A stand pivot to tub transfer bench in shower using grab bars. She bathed with supervision from seated position using LH sponge to complete LB bathing.  She returned to w/c to dress. Min A to don button down shirt. She used reacher to thread B LEs into pants with min A for clothing management. She stood at [facility] with CGA. Assist required to advance pants entirely over hips due to tight fitting pants. Pt tolerating standing ~2 minutes while therapist threaded brief, completed buttock hygiene for thoroughness and assisted with pulling pants up. Pt able to recall use of sock aid with min cuing from therapist to use to don B socks, improved ability to thread socks onto sock aid today.  Pt left seated in w/c at end of session, all needs in reach and chair belt alarm on.   Therapy Documentation Precautions:  Precautions Precautions: Fall Restrictions Weight Bearing Restrictions: No Pain: Pain Assessment Pain Scale: 0-10 Pain Score: 0-No pain   Therapy/Group: Individual Therapy  Joe Tanney L 03/22/2019, 10:04  AM

## 2019-03-22 NOTE — Progress Notes (Signed)
Speech Language Pathology Daily Session Note  Patient Details  Name: [name] [name] MRN: [id number] Date of Birth: [birth year]  Today's Date: 03/22/2019 SLP Individual Time: 0805-0900 SLP Individual Time Calculation (min): 55 min  Short Term Goals: Week 2: SLP Short Term Goal 1 (Week 2): STG=LTG due to remaining length of stay   Skilled Therapeutic Interventions:  Pt was seen for skilled ST targeting cognitive goals. Pt reports feeling better today in comparison to yesterday's therapy session but does state that she feels "weak" following two days in bed.  Pt was incontinent of bowel upon therapist's arrival but reported she felt that she needed to still have a bowel movement and void.  Pt was transferred to bedside commode where she did void and have an additional bowel movement.  SLP facilitated the session with the same deductive reasoning puzzle from yesterday's therapy session to assess change in function given that pt reports she is feeling better today.  Pt initially needed mod assist verbal cues for task organization and attention to detail; however, therapist was able to fade cues to min assist as pt became more familiar with task structure.  Pt was encouraged to stay sitting up in wheelchair more today to build up her endurance.  Pt was left in chair with chair alarm set and call bell within reach.  Continue per current plan of care.    Pain Pain Assessment Pain Scale: 0-10 Pain Score: 0-No pain  Therapy/Group: Individual Therapy  Eldon Zietlow, Selinda Orion 03/22/2019, 12:29 PM

## 2019-03-23 ENCOUNTER — Inpatient Hospital Stay (HOSPITAL_COMMUNITY): Payer: Self-pay | Admitting: Speech Pathology

## 2019-03-23 ENCOUNTER — Inpatient Hospital Stay (HOSPITAL_COMMUNITY): Payer: Self-pay | Admitting: Occupational Therapy

## 2019-03-23 ENCOUNTER — Inpatient Hospital Stay (HOSPITAL_COMMUNITY): Payer: Self-pay | Admitting: Physical Therapy

## 2019-03-23 MED ORDER — FUROSEMIDE 20 MG PO TABS
20.0000 mg | ORAL_TABLET | Freq: Every day | ORAL | Status: DC
  Administered 2019-03-24 – 2019-03-26 (×3): 20 mg via ORAL
  Filled 2019-03-23 (×3): qty 1

## 2019-03-23 NOTE — Progress Notes (Signed)
Physical Therapy Session Note  Patient Details  Name: [name] [name] MRN: [id number] Date of Birth: 12-30-1935  Today's Date: 03/23/2019 PT Individual Time: 6286-3817 PT Individual Time Calculation (min): 39 min   Short Term Goals: Week 2:  PT Short Term Goal 1 (Week 2): =LTG due to ELOS  Skilled Therapeutic Interventions/Progress Updates:  Pt received in w/c & agreeable to tx. Pt transfers sit<>Stand with min assist and worked on forward & retrograde gait with RW x 3 ft x 2 to focus on pt safely backing up to w/c as OT reported pt had difficulty with this this morning. Pt ambulates 75 ft x 2 with RW & min assist, pt with tremulous movements throughout body during task. Pt utilized dynavision while standing with 1UE<>intermittent no UE support with min assist fade to close supervision for 2 minutes x 2 with no rest break in between with task focusing on standing balance and standing tolerance; pt reports "that was fun!" after task. Pt performs sit<>stand with BUE support and CGA with task focusing on safe hand placement for transfers. Pt then performed 5x sit<>stand x 2 sets without UE support with min assist, cuing for anterior weight shifting, and task focusing on BLE strengthening. At end of session pt left sitting in w/c with chair alarm donned & call bell & all needs in reach.  Therapy Documentation Precautions:  Precautions Precautions: Fall Restrictions Weight Bearing Restrictions: No  Pain: Denied c/o pain.   Therapy/Group: Individual Therapy  Waunita Schooner 03/23/2019, 11:18 AM

## 2019-03-23 NOTE — Progress Notes (Signed)
Speech Language Pathology Daily Session Note  Patient Details  Name: [name] [name] MRN: [id number] Date of Birth: Nov 23, 1936  Today's Date: 03/23/2019 SLP Individual Time: 1005-1030 SLP Individual Time Calculation (min): 25 min  Short Term Goals: Week 2: SLP Short Term Goal 1 (Week 2): STG=LTG due to remaining length of stay   Skilled Therapeutic Interventions:  Pt was seen for skilled ST targeting cognitive goals. SLP facilitated the session with a novel card game to address use of memory compensatory strategies, specifically word-picture associations.  Pt was able to generate and then recall associations for 100% accuracy with mod I.  Pt could then name and recall specific category members for 100% accuracy with mod I during generative naming portion of task.  Pt was left in chair with chair alarm set and call bell within reach.  Continue per current plan of care.    Pain Pain Assessment Pain Scale: 0-10 Pain Score: 0-No pain  Therapy/Group: Individual Therapy  Cleda Imel, Selinda Orion 03/23/2019, 12:47 PM

## 2019-03-23 NOTE — Progress Notes (Signed)
Occupational Therapy Session Note  Patient Details  Name: [name] [name] MRN: [id number] Date of Birth: December 15, 1935  Today's Date: 03/23/2019 OT Individual Time: 2355-7322 and 1350-1500 OT Individual Time Calculation (min): 55 min and 70 min   Short Term Goals: Week 2:  OT Short Term Goal 1 (Week 2): STG=LTG due to LOS  Skilled Therapeutic Interventions/Progress Updates:    Session One: Pt seen for OT ADL session focusing on ADL re-training and functional standing balance/endurance.[place] in supine upon arrival, energetic, stating she "felt great" and ready for tx session.  She transfered to sitting EOB using hospital bed functions and therapist using chuck pad to assist with advancing hips to EOB. She ambulated with RW and CGA to sink. She completed grooming tasks standing at sink with CGA, tolerating 10 minutes in standing before seated rest break.  Dressing completed in standard chair. She donned pants with use of reacher with supervision, standing at RW with CGA to pull pants up. UB dressing completed with set-up and assist to fasten buttons and button up shirt. She  Used sock aid to don B socks iwht min A for positioning, independently recalling technique for use of aid. She ambulated into bathroom using RW with CGA< able to cross over uphill threshold into bathroom, an improvement from yesterday's session. Toileting task completed with steadying assist. She ambulated to small sleeper sofa in couch with close supervision. Completed sit>stand from low couch with guarding assist. Pt requiring min-mod A for balance when turning and backing up to return to w/c, VCs for reassurance and technique as pt constantly stating "I'm falling". Pt left seated in w/c at end of session, all needs in reach.   Session Two: Pt seen for OT session focusing on functional transfers, mobility and standing balance. Pt sitting up in w/c upon arrival, remains in high spirits and agreeable to tx session, denying  pain. Pt taken to ADL apartment total A in w/c for time and energy conservation. Education/demonstration provided regarding use of tub transfer bench. Pt return demonstrated transfer with overall close supervision using RW. With increased time and CGA steadying, she was able to manage B LEs over tub wall. Pt believed this will work from home and order placed. Seated rest break provided on low soft surface couch, able to complete sit<>stand with close supervision and assist to steady equipment. Throughout session, pt required VCs for technique of sit<>stand and ensuring she was completely to sitting surface before sitting.  Following rest break, she ambulated into kitchen and completed simple kitchen mobility and accessibility task, practicing obtaining items from refrigerator and overhead cabinets. Completed at overall close supervision level. Education provided throughout regarding set-up for increased accessibility and how to transport items in kitchen. Pt returned to w/c and taken to therapy gym.  Completed x3 [place] of standing horseshoe toss while standing on small wedge to facilitate anterior weight shift. Min-mod A to stand onto wedge, however, able to maintain dynamic standing balance to toss horseshoes with overall CGA. Seated rest breaks provided btwn trials. Pt stating "that was so much fun!!". Pt returned to room at end of session, left seated with all needs in reach.    Therapy Documentation Precautions:  Precautions Precautions: Fall Restrictions Weight Bearing Restrictions: No   Therapy/Group: Individual Therapy  [name] [name] 03/23/2019, 7:04 AM

## 2019-03-23 NOTE — Progress Notes (Signed)
[facility] PHYSICAL MEDICINE & REHABILITATION PROGRESS NOTE   Subjective/Complaints:  Still with occ SOB but overall improved. Keeping HOB up at night ROS: Denies CP, SOB, N/V/D  Objective:   No results found. No results for input(s): WBC, HGB, HCT, PLT in the last 72 hours. Recent Labs    03/22/19 0510  NA 145  K 3.6  CL 106  CO2 28  GLUCOSE 123*  BUN 28*  CREATININE 1.28*  CALCIUM 9.1    Intake/Output Summary (Last 24 hours) at 03/23/2019 0901 Last data filed at 03/23/2019 0524 Gross per 24 hour  Intake 440 ml  Output 200 ml  Net 240 ml     Physical Exam: Vital Signs Blood pressure (!) 172/70, pulse 68, temperature (!) 97.1 F (36.2 C), resp. rate 18, height 5\' 1"  (1.549 m), weight 78.9 kg, SpO2 92 %. Constitutional: NAD.  Vital signs reviewed. HENT: Normocephalic periorbital ecchymosis improving, right greater than left Eyes: EOMI.  No discharge. Cardiovascular: No JVD    Respiratory: Normal effort    GI: non-distended  Musculoskeletal: Bilateral lower extremity edema Neurological: She is alertand oriented, except for date of month Motor:  Bilateral lower extremities grossly 4/5 proximal to distal Ext- 1+ bilateral pre tib edema, stasis dermatitis changes with flaking bilaterally  Skin: Skin iswarmand dry. Psychiatric: Pleasant and cooperative  Assessment/Plan: 1. Functional deficits secondary to rhabdomyolysis, debility which require 3+ hours per day of interdisciplinary therapy in a comprehensive inpatient rehab setting.  Physiatrist is providing close team supervision and 24 hour management of active medical problems listed below.  Physiatrist and rehab team continue to assess barriers to discharge/monitor patient progress toward functional and medical goals  Care Tool:  Bathing    Body parts bathed by patient: Right arm, Left arm, Chest, Abdomen, Face, Right upper leg, Left upper leg, Front perineal area, Right lower leg, Left lower leg(LH  sponge)   Body parts bathed by helper: Buttocks     Bathing assist Assist Level: Minimal Assistance - Patient > 75%     Upper Body Dressing/Undressing Upper body dressing   What is the patient wearing?: Button up shirt    Upper body assist Assist Level: Minimal Assistance - Patient > 75%    Lower Body Dressing/Undressing Lower body dressing      What is the patient wearing?: Pants, Incontinence brief     Lower body assist Assist for lower body dressing: Minimal Assistance - Patient > 75%     Toileting Toileting    Toileting assist Assist for toileting: Contact Guard/Touching assist     Transfers Chair/bed transfer  Transfers assist  Chair/bed transfer activity did not occur: Safety/medical concerns  Chair/bed transfer assist level: Minimal Assistance - Patient > 75%     Locomotion Ambulation   Ambulation assist      Assist level: Minimal Assistance - Patient > 75% Assistive device: Walker-rolling Max distance: 50'   Walk 10 feet activity   Assist     Assist level: Minimal Assistance - Patient > 75% Assistive device: Walker-rolling   Walk 50 feet activity   Assist Walk 50 feet with 2 turns activity did not occur: Safety/medical concerns  Assist level: Minimal Assistance - Patient > 75% Assistive device: Walker-rolling    Walk 150 feet activity   Assist Walk 150 feet activity did not occur: Safety/medical concerns  Assist level: Contact Guard/Touching assist Assistive device: Walker-rolling    Walk 10 feet on uneven surface  activity   Assist Walk 10 feet on uneven  surfaces activity did not occur: Safety/medical concerns         Wheelchair     Assist Will patient use wheelchair at discharge?: No             Wheelchair 50 feet with 2 turns activity    Assist            Wheelchair 150 feet activity     Assist          Medical Problem List and Plan: 1.Functional mobility deficitssecondary to  rhabdomyolysis, pneumonia, ETOH abuse.  Cont CIR PT, OT  Notes reviewed multiple medical debility, labs reviewed 2. Antithrombotics: -DVT/anticoagulation:Pharmaceutical:Lovenox -antiplatelet therapy: ASA 3. Pain Management:tylenol prn. 4. Mood:team providing ego support  Neuropsych eval  -baseline anxiety present but seems to be managing fairly well at present -antipsychotic agents:  Seroquel and librium(used 10 mg bid for anxiety at home). 5. Neuropsych: This patientis?  Notfully capable of making decisions on herown behalf. 6. Skin/Wound Care:Routine pressure relief measures. 7. Fluids/Electrolytes/Nutrition:encourage PO, eating well as of late 8. SOB- orthopnea due to diastolic CHF, diuresis 9. AKI with mild rhabdomyolysis:    -pushing fluids  -Cr stable/decr to 1.23 on 4/3 and 4/9, elevated BUN due to lasix   10.Chronic diastolic CHF: with exacerbation now improved , reduce lasix to 20mg   -elevated-chest x-ray showed small pleural effusions Filed Weights   03/21/19 0500 03/22/19 0500 03/22/19 0540  Weight: 84.9 kg 82.5 kg 78.9 kg    Lasix 20mg  on 4/6, 40mg  4/7 an d4/8, ? Accuracy of 2 weight reading 4/8 differ by 4kg?same with 4/9 2 readings within minutes that differ by ~4kg 11. H/o anxiety/depression: Lost husband 12/19.Managed by Dr. Vilma Meckel and librium. 12. Abnormal LFTs:normal 4/1 13. Fall risk: Continue high low bed.  14.  Constipation   Improving  -continue probiotic  -sorbitol prn 15.  Essential hypertension  Hydralazine 50 3 times daily, increased to 75 3 times daily on 4/4 May need to adjust to 100mg  Pt recently started on daily dieuretic, monitor now on Lasix 20mg  per day  Continue Toprol 75 daily   Vitals:   03/23/19 0551 03/23/19 0841  BP: (!) 168/68 (!) 172/70  Pulse:  68  Resp:    Temp:    SpO2:    elevated this am check orthostatic vitals 16.  Acute blood loss anemia  Hemoglobin  10.6 on 4/1  Continue to monitor 17.  Thrombocytopenia  Platelets 115 on 4/1 Improved to 142 on 4/6    LOS: 10 days A FACE TO [place] E Kirsteins 03/23/2019, 9:01 AM

## 2019-03-24 ENCOUNTER — Inpatient Hospital Stay (HOSPITAL_COMMUNITY): Payer: Self-pay | Admitting: Physical Therapy

## 2019-03-24 NOTE — Progress Notes (Signed)
Physical Therapy Session Note  Patient Details  Name: [name] [name] MRN: [id number] Date of Birth: 06-11-1936  Today's Date: 03/24/2019 PT Individual Time: 1400-1440 PT Individual Time Calculation (min): 40 min   Short Term Goals: Week 2:  PT Short Term Goal 1 (Week 2): =LTG due to ELOS  Skilled Therapeutic Interventions/Progress Updates:    Pt received supine in bed, reporting SOA and back pain 6/10. RN notified of patient's symptoms and is able to provide pain medication at beginning of session. Vitals WNL: BP 116/4, SpO2 95%, HR 52 (normal for patient). Education with patient again about position changes as she has been in bed most of the day, pt declines to sit up to EOB due to back pain. LTR x 10 reps for low back stretch for back pain management. Supine BLE therex in all available planes of motion for BLE strengthening. Pt is setup A to scoot up in bed with use of bedrails. Pt left semi-reclined in bed with needs in reach, bed alarm in place at end of session.  Therapy Documentation Precautions:  Precautions Precautions: Fall Restrictions Weight Bearing Restrictions: No    Therapy/Group: Individual Therapy   Excell Seltzer, PT, DPT  03/24/2019, 2:50 PM

## 2019-03-24 NOTE — Progress Notes (Signed)
[name] [name] is a 83 y.o. female admitted for CIR with general debility following rhabdomyolysis.  Past Medical History:  Diagnosis Date  . Alopecia   . Anemia    hx of years ago   . Anxiety   . ARF (acute renal failure) ([place]) 08/23/2014  . Arthritis    psoriatic arthritis  . Back pain   . Blood transfusion    at age of 4  . Carotid stenosis 12/2016  . Chronic diastolic CHF (congestive heart failure) ([place])   . Depression   . Dysrhythmia 11/13/2016   PAFib for a short time- Echo done 11/14/16 PAF- to follow up with PCP- to see if she is a candiate for anticoags.  Not started at times time due to alcohol abuse.  [name] GERD (gastroesophageal reflux disease)   . H/O hiatal hernia   . History of acute cholangitis   . History of GI diverticular bleed   . Hyperlipidemia   . Hypertension   . Hypothyroidism   . IBS (irritable bowel syndrome)   . Pancreatitis   . Peripheral vascular disease ([place])   . PONV (postoperative nausea and vomiting)    with Breast reduction- only time  . Rosacea   . Seizures ([place]) 11/13/2016   Thopught to be from withdrawl Benzodiazepine  . Stroke [facility])    patient denies-  . Thyroid disease      Subjective: No new complaints. No new problems. Slept well.  Only complaint this a.m. is some increasing low back pain  Objective: Vital signs in last 24 hours: Temp:  [98.5 F (36.9 C)-98.7 F (37.1 C)] 98.7 F (37.1 C) (04/11 0300) Pulse Rate:  [51-62] 62 (04/11 0822) Resp:  [14-18] 17 (04/11 0300) BP: (124-149)/(50-63) 134/62 (04/11 0605) SpO2:  [94 %-95 %] 95 % (04/11 0300) Weight:  [77.3 kg] 77.3 kg (04/11 0300) Weight change:  Last BM Date: 03/23/19  Intake/Output from previous day: 04/10 0701 - 04/11 0700 In: 720 [P.O.:720] Out: -    Patient Vitals for the past 24 hrs:  BP Temp Pulse Resp SpO2 Weight  03/24/19 0822 - - 62 - - -  03/24/19 0605 134/62 - - - - -  03/24/19 0300 (!) 142/60 98.7 F (37.1 C) (!) 52 17 95 % 77.3 kg  03/23/19 1952  (!) 141/63 - - - - -  03/23/19 1949 (!) 149/63 98.5 F (36.9 C) (!) 51 18 94 % -  03/23/19 1355 - - (!) 55 - 95 % -  03/23/19 1354 (!) 124/50 - (!) 54 14 - -     Physical Exam General: No apparent distress obese HEENT: not dry Lungs: Normal effort. Lungs clear to auscultation, no crackles or wheezes. Cardiovascular: Regular rate and rhythm, no edema Abdomen: S/NT/ND; BS(+) Musculoskeletal:  unchanged Neurological: No new neurological deficits with mild lower extremity weakness Extremities.  Mild edema with stasis changes Skin: clear soft tissue swelling dorsum right hand and wrist Mental state: Alert, oriented, cooperative    Lab Results: BMET    Component Value Date/Time   NA 145 03/22/2019 0510   K 3.6 03/22/2019 0510   CL 106 03/22/2019 0510   CO2 28 03/22/2019 0510   GLUCOSE 123 (H) 03/22/2019 0510   BUN 28 (H) 03/22/2019 0510   CREATININE 1.28 (H) 03/22/2019 0510   CALCIUM 9.1 03/22/2019 0510   GFRNONAA 39 (L) 03/22/2019 0510   GFRAA 46 (L) 03/22/2019 0510   CBC    Component Value Date/Time   WBC 7.3 03/19/2019  0352   RBC 3.37 (L) 03/19/2019 0352   HGB 10.6 (L) 03/19/2019 0352   HCT 33.0 (L) 03/19/2019 0352   PLT 142 (L) 03/19/2019 0352   MCV 97.9 03/19/2019 0352   MCH 31.5 03/19/2019 0352   MCHC 32.1 03/19/2019 0352   RDW 13.1 03/19/2019 0352   LYMPHSABS 0.8 03/14/2019 0600   MONOABS 0.9 03/14/2019 0600   EOSABS 0.2 03/14/2019 0600   BASOSABS 0.0 03/14/2019 0600    Medications: I have reviewed the patient's current medications.  Assessment/Plan:  Functional deficits secondary to rhabdomyolysis.  Continue CIR DVT prophylaxis continue Lovenox Pain management/low back pain.  Continue Tylenol History of diastolic heart failure.  Improved Essential hypertension.  Continue present regimen.  Blood pressure presently well controlled.  We will continue to monitor on daily diuretic therapy   Length of stay, days: 11  Marletta Lor , MD 03/24/2019,  10:44 AM

## 2019-03-25 ENCOUNTER — Inpatient Hospital Stay (HOSPITAL_COMMUNITY): Payer: Self-pay

## 2019-03-25 NOTE — Progress Notes (Signed)
[name] [name] is a 83 y.o. female admitted for CIR with functional deficits following episode of rhabdomyolysis  Past Medical History:  Diagnosis Date  . Alopecia   . Anemia    hx of years ago   . Anxiety   . ARF (acute renal failure) ([place]) 08/23/2014  . Arthritis    psoriatic arthritis  . Back pain   . Blood transfusion    at age of 4  . Carotid stenosis 12/2016  . Chronic diastolic CHF (congestive heart failure) ([place])   . Depression   . Dysrhythmia 11/13/2016   PAFib for a short time- Echo done 11/14/16 PAF- to follow up with PCP- to see if she is a candiate for anticoags.  Not started at times time due to alcohol abuse.  [name] GERD (gastroesophageal reflux disease)   . H/O hiatal hernia   . History of acute cholangitis   . History of GI diverticular bleed   . Hyperlipidemia   . Hypertension   . Hypothyroidism   . IBS (irritable bowel syndrome)   . Pancreatitis   . Peripheral vascular disease ([place])   . PONV (postoperative nausea and vomiting)    with Breast reduction- only time  . Rosacea   . Seizures ([place]) 11/13/2016   Thopught to be from withdrawl Benzodiazepine  . Stroke [facility])    patient denies-  . Thyroid disease      Subjective: Uncomfortable night last night and slept poorly.  No further complaints of low back pain.  Objective: Vital signs in last 24 hours: Temp:  [98.3 F (36.8 C)] 98.3 F (36.8 C) (04/11 1933) Pulse Rate:  [53-60] 54 (04/12 0545) Resp:  [16-22] 18 (04/12 0545) BP: (109-154)/(45-62) 150/59 (04/12 0602) SpO2:  [91 %-95 %] 92 % (04/12 0545) Weight:  [78.7 kg] 78.7 kg (04/12 0545) Weight change: 1.4 kg Last BM Date: 03/24/19  Intake/Output from previous day: 04/11 0701 - 04/12 0700 In: 720 [P.O.:720] Out: 625 [Urine:625]   Patient Vitals for the past 24 hrs:  BP Temp Temp src Pulse Resp SpO2 Weight  03/25/19 0602 (!) 150/59 - - - - - -  03/25/19 0545 (!) 146/57 - - (!) 54 18 92 % 78.7 kg  03/24/19 2137 (!) 154/60 - - (!) 57 (!) 22 94  % -  03/24/19 1933 (!) 109/45 98.3 F (36.8 C) Oral 60 18 91 % -  03/24/19 1455 (!) 143/62 - - (!) 53 16 95 % -  03/24/19 1417 (!) 126/54 - - - - - -     Physical Exam General: No apparent distress obese HEENT: not dry Lungs: Normal effort. Lungs clear to auscultation, no crackles or wheezes. Cardiovascular: Regular rate and rhythm, no edema Abdomen: S/NT/ND; BS(+) Musculoskeletal: Swelling dorsum of right hand and wrist improved Neurological: No new neurological deficits with mild lower extremity edema Extremities.  Trace edema with stasis changes Skin: clear  Mental state: Alert, oriented, cooperative    Lab Results: BMET    Component Value Date/Time   NA 145 03/22/2019 0510   K 3.6 03/22/2019 0510   CL 106 03/22/2019 0510   CO2 28 03/22/2019 0510   GLUCOSE 123 (H) 03/22/2019 0510   BUN 28 (H) 03/22/2019 0510   CREATININE 1.28 (H) 03/22/2019 0510   CALCIUM 9.1 03/22/2019 0510   GFRNONAA 39 (L) 03/22/2019 0510   GFRAA 46 (L) 03/22/2019 0510   CBC    Component Value Date/Time   WBC 7.3 03/19/2019 0352   RBC 3.37 (L)  03/19/2019 0352   HGB 10.6 (L) 03/19/2019 0352   HCT 33.0 (L) 03/19/2019 0352   PLT 142 (L) 03/19/2019 0352   MCV 97.9 03/19/2019 0352   MCH 31.5 03/19/2019 0352   MCHC 32.1 03/19/2019 0352   RDW 13.1 03/19/2019 0352   LYMPHSABS 0.8 03/14/2019 0600   MONOABS 0.9 03/14/2019 0600   EOSABS 0.2 03/14/2019 0600   BASOSABS 0.0 03/14/2019 0600    Medications: I have reviewed the patient's current medications.  Assessment/Plan:  Functional deficits following rhabdomyolysis.  Continue CIR History of diastolic heart failure.  Appears compensated Essential hypertension.  Stable DVT prophylaxis continue Lovenox History of low back pain.  Improved.  Continue Tylenol as needed    Length of stay, days: 12  Marletta Lor , MD 03/25/2019, 9:47 AM

## 2019-03-25 NOTE — Progress Notes (Signed)
Occupational Therapy Session Note  Patient Details  Name: [name] [name] MRN: [id number] Date of Birth: 1936-05-28  Today's Date: 03/25/2019 OT Individual Time: 1115-1200 OT Individual Time Calculation (min): 45 min    Short Term Goals: Week 2:  OT Short Term Goal 1 (Week 2): STG=LTG due to LOS  Skilled Therapeutic Interventions/Progress Updates:    Pt received supine in bed with no c/o pain agreeable to session. Pt completed bed mobility to transition to sitting EOB with min A to elevate trunk and scoot hips forward. Pt used RW to complete sit > stand with CGA, minimal cueing for UE placement. Pt completed functional mobility to the sink and sat in w/c with wash UB/LB. Min A to reach buttocks in standing for thoroughness. Pt cued to use reacher to don underwear/pants and was able to do to thread pants. Pt required min A to pull up clothes completely in standing, esp pants d/t tightness. Pt intermittently emotional/sharing memories of late husband, emotional support and therapeutic touch provided as needed. Pt completed functional mobility into bathroom and completed toileting with min A overall. Pt returned to her w/c and was left sitting up with chair alarm belt set and all needs met.   Therapy Documentation Precautions:  Precautions Precautions: Fall Restrictions Weight Bearing Restrictions: No Pain:  No c/o pain throughout session   Therapy/Group: Individual Therapy  Curtis Sites 03/25/2019, 7:28 AM

## 2019-03-26 ENCOUNTER — Inpatient Hospital Stay (HOSPITAL_COMMUNITY): Payer: Self-pay | Admitting: Occupational Therapy

## 2019-03-26 ENCOUNTER — Inpatient Hospital Stay (HOSPITAL_COMMUNITY): Payer: Self-pay | Admitting: Physical Therapy

## 2019-03-26 ENCOUNTER — Inpatient Hospital Stay (HOSPITAL_COMMUNITY): Payer: Self-pay | Admitting: Speech Pathology

## 2019-03-26 DIAGNOSIS — R7989 Other specified abnormal findings of blood chemistry: Secondary | ICD-10-CM

## 2019-03-26 LAB — CBC
HCT: 29.5 % — ABNORMAL LOW (ref 36.0–46.0)
Hemoglobin: 9 g/dL — ABNORMAL LOW (ref 12.0–15.0)
MCH: 30 pg (ref 26.0–34.0)
MCHC: 30.5 g/dL (ref 30.0–36.0)
MCV: 98.3 fL (ref 80.0–100.0)
Platelets: 101 10*3/uL — ABNORMAL LOW (ref 150–400)
RBC: 3 MIL/uL — ABNORMAL LOW (ref 3.87–5.11)
RDW: 13 % (ref 11.5–15.5)
WBC: 6.5 10*3/uL (ref 4.0–10.5)
nRBC: 0 % (ref 0.0–0.2)

## 2019-03-26 LAB — BASIC METABOLIC PANEL
Anion gap: 11 (ref 5–15)
BUN: 45 mg/dL — ABNORMAL HIGH (ref 8–23)
CO2: 27 mmol/L (ref 22–32)
Calcium: 8.9 mg/dL (ref 8.9–10.3)
Chloride: 103 mmol/L (ref 98–111)
Creatinine, Ser: 1.5 mg/dL — ABNORMAL HIGH (ref 0.44–1.00)
GFR calc Af Amer: 38 mL/min — ABNORMAL LOW (ref >60)
GFR calc non Af Amer: 33 mL/min — ABNORMAL LOW (ref >60)
Glucose, Bld: 104 mg/dL — ABNORMAL HIGH (ref 70–99)
Potassium: 3.8 mmol/L (ref 3.5–5.1)
Sodium: 141 mmol/L (ref 135–145)

## 2019-03-26 NOTE — Progress Notes (Signed)
Occupational Therapy Discharge Summary  Patient Details  Name: [name] [name] MRN: [id number] Date of Birth: Oct 30, 1936   Patient has met 4 of 10 long term goals due to improved activity tolerance, improved balance, postural control, improved attention, improved awareness and improved coordination.  Patient to discharge at overall Supervision level.  Patient's care partner is independent to provide the necessary physical and cognitive assistance at discharge.  Pt lives with daughter who was providing 24 hr assist PTA and is willing and able to cont to provide that level of assist at d/c. Pt's daughter participated in hands on family education session. She voices willingness to provide needed assist at d/c. Pt completing functional mobility and transfers with use of RW and close supervision overall, min A required at times due to fatigue and shakiness which pt reports is side effect of medication.  Pt with gradual decline in function over rehab admission with increase in fatigue and tremor like movements. She requires increased time and rest breaks to complete all basic ADLs. She has shown ability to perform all ADLs at close supervision level, however, can require up to mod A.  She is using AE for LB dressing for increased independence. Recommend use of tub transfer bench at d/c if pt chooses to shower, however, believe sponge bathing is safest option at this time. Pt and caregiver aware of this recommendation.   OT Goals Not Met:  Pt can require min-mod A for basic ADLs depending on fatigue level. Pt's daughter is willing and able to provide this level of care at d/c.    Recommendation:  Patient will benefit from ongoing skilled OT services in home health setting to continue to advance functional skills in the area of BADL, iADL and Reduce care partner burden.  Equipment: Tub transfer bench  Reasons for discharge: treatment goals met and discharge from hospital  Patient/family agrees with  progress made and goals achieved: Yes  OT Discharge Precautions/Restrictions  Precautions Precautions: Fall Restrictions Weight Bearing Restrictions: No Vision Baseline Vision/History: Wears glasses Wears Glasses: Reading only Patient Visual Report: No change from baseline Vision Assessment?: No apparent visual deficits Perception  Perception: Within Functional Limits Praxis Praxis: Intact Cognition Overall Cognitive Status: Impaired/Different from baseline Arousal/Alertness: Awake/alert Attention: Sustained Sustained Attention: Appears intact Memory: Impaired Memory Impairment: Storage deficit;Retrieval deficit;Decreased recall of new information Sensation Sensation Light Touch: Appears Intact Proprioception: Appears Intact Coordination Gross Motor Movements are Fluid and Coordinated: No Fine Motor Movements are Fluid and Coordinated: No Coordination and Movement Description: impaired by generalized weakness, shakiness noted, pt notes shakiness has baseline 2/2 medication side-effects.  Finger Nose Finger Test: slow, shaky Motor  Motor Motor: Other (comment) Motor - Discharge Observations: Generalized weakness/deconditioning Trunk/Postural Assessment  Cervical Assessment Cervical Assessment: Exceptions to WFL(Forward head ) Thoracic Assessment Thoracic Assessment: Exceptions to WFL(Rounded shoulders; kyphotic) Lumbar Assessment Lumbar Assessment: Exceptions to WFL(Posterior pelvic tilt) Postural Control Postural Control: Deficits on evaluation Righting Reactions: delayed; insufficient  Balance Balance Balance Assessed: Yes Static Sitting Balance Static Sitting - Balance Support: No upper extremity supported;Feet supported Static Sitting - Level of Assistance: 6: Modified independent (Device/Increase time);5: Stand by assistance Static Sitting - Comment/# of Minutes: Sitting EOB Dynamic Sitting Balance Dynamic Sitting - Balance Support: No upper extremity  supported;Feet supported Dynamic Sitting - Level of Assistance: 5: Stand by assistance;6: Modified independent (Device/Increase time) Sitting balance - Comments: Sittin gEOB to dress Static Standing Balance Static Standing - Balance Support: During functional activity;Right upper extremity supported;Left upper extremity supported Static Standing -  Level of Assistance: 5: Stand by assistance Static Standing - Comment/# of Minutes: Standing at RW Dynamic Standing Balance Dynamic Standing - Balance Support: During functional activity;Left upper extremity supported;Right upper extremity supported Dynamic Standing - Level of Assistance: 5: Stand by assistance;4: Min assist Dynamic Standing - Comments: Standing to complete LB clothing management  Extremity/Trunk Assessment RUE Assessment RUE Assessment: Exceptions to [facility] General Strength Comments: strength grossly 3+/5, able to use WFL LUE Assessment LUE Assessment: Within Functional Limits Active Range of Motion (AROM) Comments: WFL (h/o shoulder surgery) General Strength Comments: grossly 4/5,    Michelangelo Rindfleisch L 03/26/2019, 8:10 AM

## 2019-03-26 NOTE — Progress Notes (Addendum)
[facility] PHYSICAL MEDICINE & REHABILITATION PROGRESS NOTE   Subjective/Complaints:  Pt states she didn't sleep well last night. Admittedly anxious about getting home. intermitent SOB  ROS: Patient denies fever, rash, sore throat, blurred vision, nausea, vomiting, diarrhea, cough,   chest pain, joint or back pain, headache .    Objective:   No results found. Recent Labs    03/26/19 0607  WBC 6.5  HGB 9.0*  HCT 29.5*  PLT 101*   Recent Labs    03/26/19 0607  NA 141  K 3.8  CL 103  CO2 27  GLUCOSE 104*  BUN 45*  CREATININE 1.50*  CALCIUM 8.9    Intake/Output Summary (Last 24 hours) at 03/26/2019 5176 Last data filed at 03/26/2019 0900 Gross per 24 hour  Intake 240 ml  Output 225 ml  Net 15 ml     Physical Exam: Vital Signs Blood pressure 129/65, pulse (!) 49, temperature 98.3 F (36.8 C), temperature source Oral, resp. rate 16, height 5\' 1"  (1.549 m), weight 77.8 kg, SpO2 94 %. Constitutional: No distress . Vital signs reviewed. HEENT: EOMI, oral membranes moist Neck: supple Cardiovascular: RRR without murmur. No JVD    Respiratory: CTA Bilaterally without wheezes or rales. Normal effort    GI: BS +, non-tender, non-distended  Musculoskeletal: Bilateral lower extremity edema Neurological: She is alertand oriented x 3 Motor:  Bilateral lower extremities grossly 4/5 proximal to distal Ext- 1+ bilateral pre tib edema, stasis dermatitis changes unchanged Skin: Skin iswarmand dry. Psychiatric: pleasant but a little anxious  Assessment/Plan: 1. Functional deficits secondary to rhabdomyolysis, debility which require 3+ hours per day of interdisciplinary therapy in a comprehensive inpatient rehab setting.  Physiatrist is providing close team supervision and 24 hour management of active medical problems listed below.  Physiatrist and rehab team continue to assess barriers to discharge/monitor patient progress toward functional and medical goals  Care  Tool:  Bathing    Body parts bathed by patient: Right arm, Left arm, Chest, Abdomen, Face, Right upper leg, Left upper leg, Front perineal area, Right lower leg, Left lower leg   Body parts bathed by helper: Buttocks     Bathing assist Assist Level: Minimal Assistance - Patient > 75%     Upper Body Dressing/Undressing Upper body dressing   What is the patient wearing?: Button up shirt    Upper body assist Assist Level: Minimal Assistance - Patient > 75%    Lower Body Dressing/Undressing Lower body dressing      What is the patient wearing?: Pants, Incontinence brief     Lower body assist Assist for lower body dressing: Minimal Assistance - Patient > 75%     Toileting Toileting    Toileting assist Assist for toileting: Contact Guard/Touching assist     Transfers Chair/bed transfer  Transfers assist  Chair/bed transfer activity did not occur: Safety/medical concerns  Chair/bed transfer assist level: Minimal Assistance - Patient > 75%     Locomotion Ambulation   Ambulation assist      Assist level: Minimal Assistance - Patient > 75% Assistive device: Walker-rolling Max distance: 75 ft   Walk 10 feet activity   Assist     Assist level: Minimal Assistance - Patient > 75% Assistive device: Walker-rolling   Walk 50 feet activity   Assist Walk 50 feet with 2 turns activity did not occur: Safety/medical concerns  Assist level: Minimal Assistance - Patient > 75% Assistive device: Walker-rolling    Walk 150 feet activity   Assist Walk  150 feet activity did not occur: Safety/medical concerns  Assist level: Contact Guard/Touching assist Assistive device: Walker-rolling    Walk 10 feet on uneven surface  activity   Assist Walk 10 feet on uneven surfaces activity did not occur: Safety/medical concerns         Wheelchair     Assist Will patient use wheelchair at discharge?: No             Wheelchair 50 feet with 2 turns  activity    Assist            Wheelchair 150 feet activity     Assist          Medical Problem List and Plan: 1.Functional mobility deficitssecondary to rhabdomyolysis, pneumonia, ETOH abuse.  Cont CIR PT, OT  DC 4/14 tentatively depending upon issues below  -Patient to see Rehab MD/provider in the office for transitional care encounter in 2-4 weeks.  2. Antithrombotics: -DVT/anticoagulation:Pharmaceutical:Lovenox -antiplatelet therapy: ASA 3. Pain Management:tylenol prn. 4. Mood:team providing ego support  Neuropsych eval  -baseline anxiety present but seems to be managing fairly well at present -antipsychotic agents:  Seroquel and librium(used 10 mg bid for anxiety at home). 5. Neuropsych: This patientis?  Notfully capable of making decisions on herown behalf. 6. Skin/Wound Care:Routine pressure relief measures. 7. Fluids/Electrolytes/Nutrition:encourage PO, eating well as of late 8. SOB- orthopnea due to diastolic CHF.   -diuresis  -anxiety component 9. AKI with mild rhabdomyolysis:    -   -BUN/Cr elevated to 45/1.5 likely d/t diuresis  -hold lasix, encourage fluids for now   10.Chronic diastolic CHF: with exacerbation, improved after diuresis   -hold lasix d/t #9   recent chest x-ray showed small pleural effusions Filed Weights   03/24/19 0300 03/25/19 0545 03/26/19 0452  Weight: 77.3 kg 78.7 kg 77.8 kg    -weights stable  -not sure where she should be "living" from a volume and HR standpoint.   -have requested cardiology consult as per below 11. H/o anxiety/depression: Lost husband 12/19.Managed by Dr. Vilma Meckel and librium. 12. Abnormal LFTs:normal 4/1 13. Fall risk: Continue high low bed.  14.  Constipation   Improving  -continue probiotic  -sorbitol prn 15.  Essential hypertension/bradycardia  Hydralazine 50 3 times daily, increased to 75 3 times daily on 4/4   Held Lasix  20mg  per day due to AKI  Continue Toprol 75 daily  -reasonable control at present but HR in 40's, EKG today with brady in 50's   -if brady persistent, may need to back off metoprolol to 50mg    -will request cards follow up prior to discharge   Vitals:   03/25/19 1937 03/26/19 0452  BP: (!) 120/51 129/65  Pulse: (!) 43 (!) 49  Resp: 16 16  Temp:    SpO2: 94% 94%    16.  Acute blood loss anemia  Hemoglobin  9.0 4/13  Continue to monitor 17.  Thrombocytopenia  Platelets 101 4/13  -recheck tomorrow    LOS: 13 days A FACE TO [place] 03/26/2019, 9:22 AM

## 2019-03-26 NOTE — Progress Notes (Signed)
Occupational Therapy Session Note  Patient Details  Name: [name] [name] MRN: [id number] Date of Birth: Jul 09, 1936  Today's Date: 03/26/2019 OT Individual Time: 1700-1749 OT Individual Time Calculation (min): 60 min    Short Term Goals: Week 2:  OT Short Term Goal 1 (Week 2): STG=LTG due to LOS  Skilled Therapeutic Interventions/Progress Updates:    Pt seen for OT ADL bathing/dressing session.Pt sitting up in bed upon arrival with NT present assistingwith set-up of meal. Pt agreeable to tx session and denying pain.Pt agreeable to come sitting EOB to eat breakfast. Transferred with mod A using hospital bed functions and use of chuck pad to adjust hips. She ate breakfast seated EOB with assist for set-up and modifications due pt's shakiness in B hands.   She completed functional ambulation and transfers throughout session using RW and overall close supervision with occasional min A for balance. Pt requiring increased time and trails to be able to go up threshold into bathroom. Toileting task completed with steadying assist. She ambulated back out of bathroom and completed hand hygiene in standing with close supervision. She completed remainder of grooming tasks from w/c level 2/2 fatigue and energy conservation with set-up assist.   She completed dressing task from w/c level. Set-up UB dressing, using AE to thread LEs into pants, standing at RW with supervision to pull pants up. TED hose and socks donned total A for time management. Pt able to recall use of sock aid in order to complete at more independent level.    Pt left seated in w/c at end of session, all needs in reach.   Education provided throughout session regarding importance of participation and indpendence with ADLs, continuum of care, and d/c planning. Pt very excited for planned d/c home tomorrow.  Pt shaky throughout session, however, able to compensate and cont to complete basic ADLs and mobility at overall close supervision level-  occasional CGA.   Therapy Documentation Precautions:  Precautions Precautions: Fall Restrictions Weight Bearing Restrictions: No Pain:   No/denies pain   Therapy/Group: Individual Therapy  Zhania Shaheen L 03/26/2019, 7:06 AM

## 2019-03-26 NOTE — Progress Notes (Signed)
Speech Language Pathology Daily Session Note  Patient Details  Name: MARIE BOROWSKI MRN: [id number] Date of Birth: [birth year]  Today's Date: 03/26/2019 SLP Individual Time: 1030-1100 SLP Individual Time Calculation (min): 30 min  Short Term Goals: Week 2: SLP Short Term Goal 1 (Week 2): STG=LTG due to remaining length of stay   Skilled Therapeutic Interventions:  Skilled treatment session #1 focused on cognition goals. SLP facilitated session by providing supervision cues for semi-complex card game. Pt was supervision for recall of previously instructed rules and was supervision for problem solving. Pt able to demonstrate anticipatory awareness by stating that daughter would need to help her perform all activities at home.   Skilled treatment session #2 focused on cognition goals. SLP facilitated session by providing hypothetical safety situations that might occur within home environment. Pt able to answer questions with supervision cues. Pt states that her daughter will be with her and there isn't a plan in place for how long. Pt left upright in wheelchair, lap belt alarm on and all needs within reach. Continue per current plan of care.      Pain Pain Assessment Pain Scale: 0-10 Pain Score: 0-No pain   Therapy/Group: Individual Therapy  Haniyah Maciolek 03/26/2019, 2:43 PM

## 2019-03-26 NOTE — Progress Notes (Signed)
Physical Therapy Session Note  Patient Details  Name: [name] [name] MRN: [id number] Date of Birth: 1936-01-09  Today's Date: 03/26/2019 PT Individual Time: 1330-1430 PT Individual Time Calculation (min): 60 min   Short Term Goals: Week 2:  PT Short Term Goal 1 (Week 2): =LTG due to ELOS  Skilled Therapeutic Interventions/Progress Updates:    Pt received seated in w/c in room, agreeable to PT session. No complaints of pain. Sit to stand with CGA to RW. Ambulation 4 x 50 ft with RW and CGA, increased cueing needed for safety with turns. Pt has onset of whole body tremors with fatigue, reports this is a side effect of her anti-depressants. Ascend/descend one 6" curb with RW and min A x 4 repetitions, v/c for safe RW management and technique. Manual w/c propulsion x 150 ft with use of BUE and Supervision. Pt left seated in w/c in room with needs in reach at end of session.  Therapy Documentation Precautions:  Precautions Precautions: Fall Restrictions Weight Bearing Restrictions: No Pain: Pain Assessment Pain Scale: 0-10 Pain Score: 0-No pain Pain Type: Chronic pain Pain Location: Back Pain Descriptors / Indicators: Aching Pain Intervention(s): Medication (See eMAR);Repositioned    Therapy/Group: Individual Therapy   Excell Seltzer, PT, DPT  03/26/2019, 3:46 PM

## 2019-03-26 NOTE — Progress Notes (Signed)
Occupational Therapy Session Note  Patient Details  Name: [name] [name] MRN: [id number] Date of Birth: 07/26/1936  Today's Date: 03/26/2019 OT Individual Time: 0900-1000 OT Individual Time Calculation (min): 60 min    Short Term Goals: Week 1:  OT Short Term Goal 1 (Week 1): Pt will complete bathing with min assist to demonstrate increased activity tolerance OT Short Term Goal 1 - Progress (Week 1): Met OT Short Term Goal 2 (Week 1): Pt will complete LB dressing (except footwear) with Min assist OT Short Term Goal 2 - Progress (Week 1): Met OT Short Term Goal 3 (Week 1): Pt will complete 2 grooming tasks in standing with CGA to demonstrate increased activity tolerance OT Short Term Goal 3 - Progress (Week 1): Met OT Short Term Goal 4 (Week 1): Pt will complete toilet transfer with min assist with LRAD OT Short Term Goal 4 - Progress (Week 1): Met Week 2:  OT Short Term Goal 1 (Week 2): STG=LTG due to LOS      Skilled Therapeutic Interventions/Progress Updates:    Pt seen this session to plan a HEP that she could do at home integrating in functional activities.  Pt received in w/c and agreeable to therapy.  Pt taken to ADL apt and pt practiced standing for 5 min at a time while folding wash cloths, wiping off kitchen counter, reaching in cupboards.  She had difficulty standing to fold towels so recommended she sit when she folds shirts and pants and stands for smaller items. Pt practiced standing at kitchen sink to do standing balance exercises for 10 reps each of side taps, forward taps, mini squats, heel raises. Wrote down HEP suggestions/ exercises and number of reps for pt to use as a reference guide.   Pt tolerated exercises well.  Pt taken back to room with all needs met.  Therapy Documentation Precautions:  Precautions Precautions: Fall Restrictions Weight Bearing Restrictions: No  Pain: Pain Assessment Pain Scale: 0-10 Pain Score: 0-No pain    Therapy/Group:  Individual Therapy  [place] 03/26/2019, 12:31 PM

## 2019-03-26 NOTE — Progress Notes (Signed)
Social Work Patient ID: [name] [name], female   DOB: Apr 24, 1936, 83 y.o.   MRN: [id number]  Alerted by MD this morning that pt now with medical concerns and awaiting input from cardiology service.  Tomorrow's anticipated d/c date now changed to [facility] 4/15 if medically cleared to do so.  Pt and daughter aware.  Sheri Prows, LCSW

## 2019-03-26 NOTE — Plan of Care (Signed)
  Problem: Consults Goal: RH GENERAL PATIENT EDUCATION Description See Patient Education module for education specifics. Outcome: Progressing   Problem: RH BOWEL ELIMINATION Goal: RH STG MANAGE BOWEL WITH ASSISTANCE Description STG Manage Bowel with Mod Assistance.  Outcome: Progressing Flowsheets (Taken 03/26/2019 1414) STG: Pt will manage bowels with assistance: 4-Minimum assistance   Problem: RH SKIN INTEGRITY Goal: RH STG SKIN FREE OF INFECTION/BREAKDOWN Description No new breakdown with min assist    Outcome: Progressing   Problem: RH SAFETY Goal: RH STG ADHERE TO SAFETY PRECAUTIONS W/ASSISTANCE/DEVICE Description STG Adhere to Safety Precautions With Supervision Assistance/Device.  Outcome: Progressing Flowsheets (Taken 03/26/2019 1414) STG:Pt will adhere to safety precautions with assistance/device: 4-Minimal assistance Goal: RH STG DECREASED RISK OF FALL WITH ASSISTANCE Description STG Decreased Risk of Fall With supervision Assistance. min  Outcome: Progressing Flowsheets (Taken 03/26/2019 1414) [id number] risk of fall  with assistance/device: 4-Minimal assistance   Problem: RH PAIN MANAGEMENT Goal: RH STG PAIN MANAGED AT OR BELOW PT'S PAIN GOAL Description < 3 out of 10.   Outcome: Progressing

## 2019-03-26 NOTE — Progress Notes (Addendum)
Patient c/o not being able to sleep. She was given all of her sleep medications. Heat packs applied to her back as requested. She stated that this was her 2nd night of not sleeping. This morning, she c/o SOB, but oxygen sats were 94%. When this nurse went to her room, no signs of distress was noted. She has had some bradycardia since yesterday. PA was consulted & an EKG was ordered. Will continue to monitor & transcribe orders. Cardizem, hydralazine & synthroid  for this morning were held. Will pass on to oncoming charge nurse.

## 2019-03-27 ENCOUNTER — Inpatient Hospital Stay (HOSPITAL_COMMUNITY): Payer: Self-pay

## 2019-03-27 ENCOUNTER — Inpatient Hospital Stay (HOSPITAL_COMMUNITY): Payer: Self-pay | Admitting: Physical Therapy

## 2019-03-27 ENCOUNTER — Inpatient Hospital Stay (HOSPITAL_COMMUNITY): Payer: Self-pay | Admitting: Occupational Therapy

## 2019-03-27 DIAGNOSIS — I5033 Acute on chronic diastolic (congestive) heart failure: Secondary | ICD-10-CM

## 2019-03-27 LAB — BASIC METABOLIC PANEL
Anion gap: 10 (ref 5–15)
BUN: 45 mg/dL — ABNORMAL HIGH (ref 8–23)
CO2: 27 mmol/L (ref 22–32)
Calcium: 8.9 mg/dL (ref 8.9–10.3)
Chloride: 105 mmol/L (ref 98–111)
Creatinine, Ser: 1.48 mg/dL — ABNORMAL HIGH (ref 0.44–1.00)
GFR calc Af Amer: 38 mL/min — ABNORMAL LOW (ref >60)
GFR calc non Af Amer: 33 mL/min — ABNORMAL LOW (ref >60)
Glucose, Bld: 115 mg/dL — ABNORMAL HIGH (ref 70–99)
Potassium: 4 mmol/L (ref 3.5–5.1)
Sodium: 142 mmol/L (ref 135–145)

## 2019-03-27 LAB — CBC
HCT: 30 % — ABNORMAL LOW (ref 36.0–46.0)
Hemoglobin: 9.3 g/dL — ABNORMAL LOW (ref 12.0–15.0)
MCH: 30.9 pg (ref 26.0–34.0)
MCHC: 31 g/dL (ref 30.0–36.0)
MCV: 99.7 fL (ref 80.0–100.0)
Platelets: 87 10*3/uL — ABNORMAL LOW (ref 150–400)
RBC: 3.01 MIL/uL — ABNORMAL LOW (ref 3.87–5.11)
RDW: 13.2 % (ref 11.5–15.5)
WBC: 5.4 10*3/uL (ref 4.0–10.5)
nRBC: 0 % (ref 0.0–0.2)

## 2019-03-27 MED ORDER — FUROSEMIDE 40 MG PO TABS
40.0000 mg | ORAL_TABLET | Freq: Every day | ORAL | Status: AC
  Administered 2019-03-27 – 2019-03-28 (×2): 40 mg via ORAL
  Filled 2019-03-27 (×2): qty 1

## 2019-03-27 MED ORDER — FLUTICASONE PROPIONATE 50 MCG/ACT NA SUSP
1.0000 | Freq: Every day | NASAL | Status: DC
  Administered 2019-03-27 – 2019-04-03 (×8): 1 via NASAL
  Filled 2019-03-27: qty 16

## 2019-03-27 MED ORDER — FUROSEMIDE 20 MG PO TABS
20.0000 mg | ORAL_TABLET | Freq: Every day | ORAL | Status: DC
  Administered 2019-03-29: 20 mg via ORAL
  Filled 2019-03-27: qty 1

## 2019-03-27 NOTE — Plan of Care (Signed)
  Problem: Consults Goal: RH GENERAL PATIENT EDUCATION Description See Patient Education module for education specifics. Outcome: Progressing   Problem: RH BOWEL ELIMINATION Goal: RH STG MANAGE BOWEL WITH ASSISTANCE Description STG Manage Bowel with Mod Assistance.  Outcome: Progressing   Problem: RH SKIN INTEGRITY Goal: RH STG SKIN FREE OF INFECTION/BREAKDOWN Description No new breakdown with min assist    Outcome: Progressing   Problem: RH SAFETY Goal: RH STG ADHERE TO SAFETY PRECAUTIONS W/ASSISTANCE/DEVICE Description STG Adhere to Safety Precautions With Supervision Assistance/Device.  Outcome: Progressing Goal: RH STG DECREASED RISK OF FALL WITH ASSISTANCE Description STG Decreased Risk of Fall With supervision Assistance. min  Outcome: Progressing   Problem: RH PAIN MANAGEMENT Goal: RH STG PAIN MANAGED AT OR BELOW PT'S PAIN GOAL Description < 3 out of 10.   Outcome: Progressing

## 2019-03-27 NOTE — Consult Note (Signed)
Cardiology Consultation:   Patient ID: [name] [name]; [id number]; [birth year]   Admit date: 03/13/2019 Date of Consult: 03/27/2019  Primary Care Provider: [name], MD Primary Cardiologist: [name] [name]; Dr. Burt [name] Primary Electrophysiologist:  None   Patient Profile:   [name] [name] is a 83 y.o. female with a PMH of chronic diastolic CHF, HTN, PAF (not on AC due to falls), anxiety/depression, ETOH abuse, and psoriatic arthritis, who is being seen today for the evaluation of CHF at the request of Dr. Naaman [name].  History of Present Illness:   [name] [name] was recently admitted to 2/2 UTI the hospital from 3/27-3/31 after experiencing a fall felt to be 2/2 polypharmacy/ ETOH abuse. Her hospital course included management of hypothermia, AKI, possible aspiration PNA, and Afib RVR. She was discharged to [facility] 03/13/2019, where she currently remains admitted. Prior to admission she was prescribed prn lasix 20mg . On 03/19/2019 she reported having some orthopnea and weight was up 2kg. She was started on home po lasix at that time. Lasix was increased to 40mg  daily x3 days, then 20mg  dosing resumed, however she had a bump in her Cr 03/26/2019 and lasix was subsequently discontinued. Cardiology asked to evaluate for CHF.   Past Medical History:  Diagnosis Date  . Alopecia   . Anemia    hx of years ago   . Anxiety   . ARF (acute renal failure) ([place]) 08/23/2014  . Arthritis    psoriatic arthritis  . Back pain   . Blood transfusion    at age of 4  . Carotid stenosis 12/2016  . Chronic diastolic CHF (congestive heart failure) ([place])   . Depression   . Dysrhythmia 11/13/2016   PAFib for a short time- Echo done 11/14/16 PAF- to follow up with PCP- to see if she is a candiate for anticoags.  Not started at times time due to alcohol abuse.  [name] GERD (gastroesophageal reflux disease)   . H/O hiatal hernia   . History of acute cholangitis   . History of GI diverticular bleed   . Hyperlipidemia    . Hypertension   . Hypothyroidism   . IBS (irritable bowel syndrome)   . Pancreatitis   . Peripheral vascular disease ([place])   . PONV (postoperative nausea and vomiting)    with Breast reduction- only time  . Rosacea   . Seizures ([place]) 11/13/2016   Thopught to be from withdrawl Benzodiazepine  . Stroke [facility])    patient denies-  . Thyroid disease     Past Surgical History:  Procedure Laterality Date  . ABDOMINAL HYSTERECTOMY     partial  . BACK SURGERY  2010   thoractic-screws and rods  . BILE DUCT STENT PLACEMENT  08/26/2014  . BREAST REDUCTION SURGERY    . CHOLECYSTECTOMY  1988   lap  . COLONOSCOPY    . COLONOSCOPY Left 09/08/2014   Procedure: COLONOSCOPY;  Surgeon: Arta Silence, MD;  Location: [facility] ENDOSCOPY;  Service: Endoscopy;  Laterality: Left;  . ENDARTERECTOMY Left 12/28/2016  . ENDARTERECTOMY Left 12/28/2016   Procedure: ENDARTERECTOMY CAROTID LEFT WITH XENOSURE BIOLOGIC PATCH ANGIOPLASTY;  Surgeon: Angelia Mould, MD;  Location: [place];  Service: Vascular;  Laterality: Left;  . ERCP    . ERCP N/A 08/26/2014   Procedure: ENDOSCOPIC RETROGRADE CHOLANGIOPANCREATOGRAPHY (ERCP);  Surgeon: Jeryl Columbia, MD;  Location: [facility] ENDOSCOPY;  Service: Endoscopy;  Laterality: N/A;  . ERCP N/A 09/11/2014   Procedure: ENDOSCOPIC RETROGRADE CHOLANGIOPANCREATOGRAPHY (ERCP);  Surgeon: Arta Silence, MD;  Location: [facility] ENDOSCOPY;  Service: Endoscopy;  Laterality: N/A;  . EUS N/A 09/11/2014   Procedure: ESOPHAGEAL ENDOSCOPIC ULTRASOUND (EUS) RADIAL;  Surgeon: Arta Silence, MD;  Location: [facility] ENDOSCOPY;  Service: Endoscopy;  Laterality: N/A;  . EYE SURGERY Bilateral    Cataract  . JOINT REPLACEMENT  2005   left shoulder replacement   . ORIF CLAVICULAR FRACTURE Left 07/18/2014   Procedure: OPEN REDUCTION INTERNAL FIXATION (ORIF) LEFT CLAVICULAR FRACTURE;  Surgeon: Ninetta Lights, MD;  Location: [place];  Service: Orthopedics;  Laterality: Left;  . SPYGLASS  CHOLANGIOSCOPY N/A 09/11/2014   Procedure: SPYGLASS CHOLANGIOSCOPY;  Surgeon: Arta Silence, MD;  Location: [facility] ENDOSCOPY;  Service: Endoscopy;  Laterality: N/A;  . TONSILLECTOMY  as child  . TOTAL KNEE ARTHROPLASTY Left 03/10/2016   Procedure: LEFT TOTAL KNEE ARTHROPLASTY;  Surgeon: Ninetta Lights, MD;  Location: [place];  Service: Orthopedics;  Laterality: Left;  [name] VENTRAL HERNIA REPAIR  06/08/2012   Procedure: LAPAROSCOPIC VENTRAL HERNIA;  Surgeon: Adin Hector, MD;  Location: [facility]S;  Service: General;  Laterality: Right;     Home Medications:  Prior to Admission medications   Medication Sig Start Date End Date Taking? Authorizing Provider  acetaminophen (TYLENOL) 325 MG tablet Take 2 tablets (650 mg total) by mouth every 6 (six) hours as needed for mild pain (or Fever >/= 101). 06/30/17   Rai, Ripudeep K, MD  aspirin EC 81 MG tablet Take 81 mg by mouth daily.    [provider]  atenolol (TENORMIN) 50 MG tablet Take 1 tablet (50 mg total) by mouth daily. Patient taking differently: Take 100 mg by mouth every morning.  03/16/17   Velvet Bathe, MD  chlordiazePOXIDE (LIBRIUM) 5 MG capsule Take 10 mg by mouth 2 (two) times daily.  07/16/17   [provider]  diltiazem (CARDIZEM) 90 MG tablet Take 1 tablet (90 mg total) by mouth 2 (two) times daily. 03/13/19   Thurnell Lose, MD  FLUoxetine (PROZAC) 20 MG capsule Take 20 mg by mouth daily.    [provider]  folic acid (FOLVITE) 1 MG tablet Take 1 tablet (1 mg total) by mouth daily. 03/14/19   Thurnell Lose, MD  furosemide (LASIX) 20 MG tablet Take 1 tablet (20 mg total) by mouth daily as needed. For > 3 lb weight gain in 24 hours. Patient taking differently: Take 20 mg by mouth daily.  07/23/17   Debbe Odea, MD  hyoscyamine (LEVBID) 0.375 MG 12 hr tablet Take 0.375 mg by mouth 2 (two) times daily.  04/08/17   [provider]  levothyroxine (SYNTHROID, LEVOTHROID) 75 MCG tablet Take 75 mcg by mouth daily  before breakfast.  08/28/16   [provider]  potassium chloride SA (K-DUR,KLOR-CON) 20 MEQ tablet Take 1 tablet (20 mEq total) by mouth daily as needed. Take only when taking Lasix. Patient taking differently: Take 40 mEq by mouth daily.  07/23/17   Debbe Odea, MD  QUEtiapine (SEROQUEL) 25 MG tablet Take 1 tablet (25 mg total) by mouth at bedtime. 11/18/16   Regalado, Belkys A, MD  simvastatin (ZOCOR) 10 MG tablet Take 10 mg by mouth every morning.  08/09/16   [provider]  thiamine 100 MG tablet Take 1 tablet (100 mg total) by mouth daily. 03/14/19   Thurnell Lose, MD  venlafaxine XR (EFFEXOR-XR) 37.5 MG 24 hr capsule Take 37.5 mg by mouth daily with breakfast.    [provider]  zolpidem (AMBIEN) 10  MG tablet Take 10 mg by mouth at bedtime. 06/23/17   [provider]    Inpatient Medications: Scheduled Meds: . aspirin EC  81 mg Oral Daily  . chlordiazePOXIDE  10 mg Oral TID  . diltiazem  90 mg Oral Q8H  . FLUoxetine  20 mg Oral Daily  . folic acid  1 mg Oral Daily  . Gerhardt's butt cream   Topical TID  . hydrALAZINE  75 mg Oral Q8H  . ketotifen  1 drop Both Eyes BID  . levothyroxine  75 mcg Oral QAC breakfast  . metoprolol succinate  75 mg Oral Daily  . pantoprazole  40 mg Oral Daily  . QUEtiapine  25 mg Oral QHS  . saccharomyces boulardii  250 mg Oral BID  . senna  2 tablet Oral QHS  . simvastatin  10 mg Oral QHS  . thiamine  100 mg Oral Daily  . venlafaxine XR  37.5 mg Oral Q breakfast   Continuous Infusions:  PRN Meds: acetaminophen, alum & mag hydroxide-simeth, bisacodyl, diphenhydrAMINE, guaiFENesin-dextromethorphan, hyoscyamine, polyethylene glycol, prochlorperazine **OR** prochlorperazine **OR** prochlorperazine, sodium phosphate, sorbitol, traZODone, zolpidem  Allergies:    Allergies  Allergen Reactions  . Tetanus Toxoids Swelling and Other (See Comments)    Arm (site) became swollen as a child  . Snake Antivenin [Antivenin  Crotalidae Polyvalent] Other (See Comments)    Just can't take per daughter  . Yellow Dyes (Non-Tartrazine) Itching and Other (See Comments)    Makes patient nervous     Social History:   Social History   Socioeconomic History  . Marital status: Married    Spouse name: Not on file  . Number of children: Not on file  . Years of education: Not on file  . Highest education level: Not on file  Occupational History  . Not on file  Social Needs  . Financial resource strain: Not on file  . Food insecurity:    Worry: Not on file    Inability: Not on file  . Transportation needs:    Medical: Not on file    Non-medical: Not on file  Tobacco Use  . Smoking status: Never Smoker  . Smokeless tobacco: Never Used  Substance and Sexual Activity  . Alcohol use: Yes    Alcohol/week: 7.0 standard drinks    Types: 7 Glasses of wine per week    Comment: rare  . Drug use: No    Types: Benzodiazepines  . Sexual activity: Not Currently  Lifestyle  . Physical activity:    Days per week: Not on file    Minutes per session: Not on file  . Stress: Not on file  Relationships  . Social connections:    Talks on phone: Not on file    Gets together: Not on file    Attends religious service: Not on file    Active member of club or organization: Not on file    Attends meetings of clubs or organizations: Not on file    Relationship status: Not on file  . Intimate partner violence:    Fear of current or ex partner: Not on file    Emotionally abused: Not on file    Physically abused: Not on file    Forced sexual activity: Not on file  Other Topics Concern  . Not on file  Social History Narrative  . Not on file    Family History:    Family History  Problem Relation Age of Onset  . Heart disease  Mother   . Cancer Father        lung  . Bipolar disorder Brother      ROS:  Please see the history of present illness.   All other ROS reviewed and negative.     Physical Exam/Data:    Vitals:   03/26/19 2024 03/27/19 0500 03/27/19 0632 03/27/19 0847  BP: (!) 165/69  (!) 163/79   Pulse: 60  (!) 58 68  Resp: 12  18   Temp:      TempSrc:      SpO2: 97%  94%   Weight:  79 kg    Height:        Intake/Output Summary (Last 24 hours) at 03/27/2019 1128 Last data filed at 03/27/2019 0900 Gross per 24 hour  Intake 450 ml  Output 400 ml  Net 50 ml   Filed Weights   03/25/19 0545 03/26/19 0452 03/27/19 0500  Weight: 78.7 kg 77.8 kg 79 kg   Body mass index is 32.91 kg/m.  General:  Well nourished, well developed, pleasant elderly woman, flat affect, in no acute distress HEENT: sclera anicteric  Lymph: no adenopathy Neck: no JVD Endocrine:  No thryomegaly Vascular: No carotid bruits; distal pulses 2+ bilaterally Cardiac:  normal S1, S2; RRR; no murmur  Lungs:  clear to auscultation bilaterally, no wheezing, rhonchi or rales  Abd: NABS, soft, nontender, no hepatomegaly Ext: TED hose are in place, 1+ bilateral lower extremity edema is present Musculoskeletal:  No deformities, BUE and BLE strength normal and equal Skin: warm and dry  Neuro:  CNs 2-12 intact, no focal abnormalities noted Psych:  Normal affect   EKG:  The EKG was personally reviewed and demonstrates:  Sinus bradycardia with 1st degree AV block, rate 51, QTC 479, no STE/D, no TWI  Telemetry:  Patient is not currently on telemetry   Relevant CV Studies: Echocardiogram 09/2018: Study Conclusions  - Left ventricle: The cavity size was normal. Wall thickness was   increased in a pattern of moderate LVH. Systolic function was   normal. The estimated ejection fraction was in the range of 60%   to 65%. Wall motion was normal; there were no regional wall   motion abnormalities. - Mitral valve: There was mild regurgitation. - Left atrium: The atrium was mildly dilated. - Pulmonary arteries: PA peak pressure: 32 mm Hg (S).  Laboratory Data:  Chemistry Recent Labs  Lab 03/22/19 0510 03/26/19 0607  03/27/19 0515  NA 145 141 142  K 3.6 3.8 4.0  CL 106 103 105  CO2 28 27 27   GLUCOSE 123* 104* 115*  BUN 28* 45* 45*  CREATININE 1.28* 1.50* 1.48*  CALCIUM 9.1 8.9 8.9  GFRNONAA 39* 33* 33*  GFRAA 46* 38* 38*  ANIONGAP 11 11 10     No results for input(s): PROT, ALBUMIN, AST, ALT, ALKPHOS, BILITOT in the last 168 hours. Hematology Recent Labs  Lab 03/26/19 0607 03/27/19 0515  WBC 6.5 5.4  RBC 3.00* 3.01*  HGB 9.0* 9.3*  HCT 29.5* 30.0*  MCV 98.3 99.7  MCH 30.0 30.9  MCHC 30.5 31.0  RDW 13.0 13.2  PLT 101* 87*   Cardiac EnzymesNo results for input(s): TROPONINI in the last 168 hours. No results for input(s): TROPIPOC in the last 168 hours.  BNPNo results for input(s): BNP, PROBNP in the last 168 hours.  DDimer No results for input(s): DDIMER in the last 168 hours.  Radiology/Studies:  No results found.  Assessment and Plan:   1. Chronic  diastolic CHF: patient reported orthopnea and SOB 1 week ago, managed with po lasix (20mg  daily x2 days, then 40mg  daily x3 days, then 20mg  daily x4 days).She had improvement in her orthopnea, SOB, and weight. Lasix was discontinued yesterday after bump in Cr to 1.5 (baseline 1.1-1.3). Weight was 80kg on admission and is down to 79k today. She was transitioned to metoprolol succinate on admission to [facility]. Cardiology asked to assist in establishing dry weight to determine lasix dosing.  - Continue metoprolol succinate  2. Paroxysmal atrial fibrillation: appears to be maintaining sinus rhythm, though did have some Afib with RVR during admission 02/2019 and she was discharged to CIR on atenolol and diltiazem. Atenolol held on admission to CIR and she was started on metoprolol succinate 75mg  daily. She appears to have HR's primarily in the 40s-60s over the past week. She is not on anticoagulation due to frequent falls, despite elevated CHA2DS2-VASc Score of at least 5 (CHF, HTN, Female, and Age >75)   3. HTN: BP mildly elevated on hydralazine,  metoprolol succinate, and diltiazem. Unable to titrate metoprolol/diltiazem given bradycardia - Consider increase in hydralazine if BP remains elevated  For questions or updates, please contact [place] Please consult [email or web address] for contact info under Cardiology/STEMI.   Signed, Abigail Butts, PA-C  03/27/2019 11:28 AM [phone]  Patient seen, examined. Available data reviewed. Agree with findings, assessment, and plan as outlined by Roby Lofts, PA-C.  The physical exam findings documented above reflect my personal findings from examination of this patient today.  History is somewhat limited.  The patient does complain of shortness of breath.  She denies chest pain.  I have reviewed all of her hospital records and recent notes from Dr. Naaman [name] who has been managing her in inpatient rehab.  The patient has paroxysmal atrial fibrillation and is a poor candidate for anticoagulation because of frequent and recurrent falls.  She is treated with aspirin 81 mg daily.  One issue appears to be that she is consistently bradycardic on a combination of metoprolol succinate and diltiazem.  I will stop her diltiazem today and we will just continue her on her current dose of metoprolol succinate.  At the time of my examination her resting heart rate by peripheral pulse exam is 48 bpm.  The second issue relates to her volume status.  She has chronic leg edema this probably multifactorial.  Her weight is down 1 kg from admission, but up 2 kg over the last 72 hours.  Despite the fact that she has mild acute kidney injury, I think she would benefit from a daily loop diuretic.  We will dose her furosemide for 40 mg daily x2 days then 20 mg daily thereafter.  She otherwise appears to be stable from a cardiac perspective.  Please call if we can be of any further assistance in her care.  In summary, we will make the following changes:   Continue metoprolol succinate 75 mg daily  Stop diltiazem because of  bradycardia  Use furosemide 40 mg daily x2 days then 20 mg daily thereafter for treatment of diastolic heart failure  Follow creatinine every 2 to 3 days while hospitalized  Sherren Mocha, M.D. 03/27/2019 5:21 PM

## 2019-03-27 NOTE — Progress Notes (Signed)
Physical Therapy Discharge Summary  Patient Details  Name: [name] [name] MRN: [id number] Date of Birth: 05/17/1936  Today's Date: 03/28/2019 PT Individual Time: 1130-1225 PT Individual Time Calculation (min):  55 min   Patient has met 3 of 6 long term goals due to improved activity tolerance, improved balance, improved postural control, increased strength and ability to compensate for deficits.  Patient to discharge at an ambulatory level Supervision.   Patient's care partner is independent to provide the necessary physical and cognitive assistance at discharge.  Reasons goals not met: Pt did not meet Supervision level for transfers, car transfers, and did not meet gait distance goal of 75 ft. Pt showed gradual decline and inconsistent progress during her rehab stay due to ongoing medical complications. Patient and her daughter are comfortable with the patient discharging home at this time and with the current assist level that the patient requires which is CGA overall with use of RW.  Recommendation:  Patient will benefit from ongoing skilled PT services in home health setting to continue to advance safe functional mobility, address ongoing impairments in balance, endurance, strength, independence with functional mobility, and minimize fall risk.  Equipment: No equipment provided. Pt already owns RW and manual w/c.  Reasons for discharge: treatment goals met and discharge from hospital  Patient/family agrees with progress made and goals achieved: Yes   Skilled Intervention: Pt received seated in w/c in room, agreeable to PT session. Pt reports 7/10 HA, RN provided pain medication prior to start of therapy session. Pt is CGA for sit to stand to RW throughout session. Ambulation 4 x 30 ft, 1 x 50 ft with RW and CGA. Standing balance: normal stance with no UE support and min A, 2 x 30 sec each. Standing BLE therex: marches, HS curls x 10 reps each with RW and min A for balance. Forward/backward  amb 3 x 10 ft with handrail and HHA min A for balance training with backwards amb. Manual w/c propulsion x 100 ft with use of BUE and Supervision. Pt left seated in w/c in room with needs in reach, quick release belt and chair alarm in place at end of session.  PT Discharge Precautions/Restrictions Precautions Precautions: Fall Restrictions Weight Bearing Restrictions: No Vision/Perception  Vision - History Baseline Vision: Wears glasses only for reading Perception Perception: Within Functional Limits Praxis Praxis: Intact  Cognition Overall Cognitive Status: Impaired/Different from baseline Arousal/Alertness: Awake/alert Orientation Level: Oriented X4 Attention: Sustained Sustained Attention: Appears intact Memory: Impaired Memory Impairment: Storage deficit;Retrieval deficit;Decreased recall of new information Awareness: Impaired Awareness Impairment: Anticipatory impairment Problem Solving: Impaired Behaviors: Lability Safety/Judgment: Impaired Sensation Sensation Light Touch: Appears Intact Proprioception: Appears Intact Coordination Gross Motor Movements are Fluid and Coordinated: No Fine Motor Movements are Fluid and Coordinated: No Coordination and Movement Description: impaired by generalized weakness, shakiness noted, pt notes shakiness has baseline 2/2 medication side-effects.  Motor  Motor Motor: Within Functional Limits Motor - Discharge Observations: Generalized weakness/deconditioning  Mobility Bed Mobility Bed Mobility: Rolling Right;Rolling Left;Supine to Sit;Sit to Supine Rolling Right: Supervision/verbal cueing Rolling Left: Supervision/Verbal cueing Supine to Sit: Supervision/Verbal cueing Sit to Supine: Supervision/Verbal cueing Transfers Transfers: Stand to Sit;Stand Pivot Transfers Stand to Sit: Supervision/Verbal cueing Stand Pivot Transfers: Supervision/Verbal cueing Stand Pivot Transfer Details: Verbal cues for technique;Verbal cues for  precautions/safety;Verbal cues for safe use of DME/AE;Tactile cues for placement Transfer (Assistive device): Rolling walker Locomotion  Gait Ambulation: Yes Gait Assistance: Supervision/Verbal cueing Assistive device: Rolling walker Gait Assistance Details: Verbal cues for precautions/safety;Verbal  cues for safe use of DME/AE Gait Gait: No Gait Pattern: Impaired Gait velocity: decreased Stairs / Additional Locomotion Stairs: Yes Stairs Assistance: Minimal Assistance - Patient > 75% Stair Management Technique: With walker Height of Stairs: 6 Wheelchair Mobility Wheelchair Mobility: Yes Wheelchair Assistance: Chartered loss adjuster: Both upper extremities Wheelchair Parts Management: Needs assistance  Trunk/Postural Assessment  Cervical Assessment Cervical Assessment: Exceptions to WFL(forward head) Thoracic Assessment Thoracic Assessment: Exceptions to WFL(rounded shoulders, kyphotic) Lumbar Assessment Lumbar Assessment: Exceptions to WFL(posterior pelvic tilt) Postural Control Postural Control: Deficits on evaluation Righting Reactions: delayed; insufficient  Balance Balance Balance Assessed: Yes Standardized Balance Assessment Standardized Balance Assessment: Berg Balance Test Berg Balance Test Sit to Stand: Able to stand  independently using hands Standing Unsupported: Able to stand 2 minutes with supervision Sitting with Back Unsupported but Feet Supported on Floor or Stool: Able to sit safely and securely 2 minutes Stand to Sit: Sits safely with minimal use of hands Transfers: Able to transfer with verbal cueing and /or supervision Standing Unsupported with Eyes Closed: Able to stand 10 seconds with supervision Standing Ubsupported with Feet Together: Needs help to attain position but able to stand for 30 seconds with feet together From Standing, Reach Forward with Outstretched Arm: Reaches forward but needs supervision From Standing  Position, Pick up Object from Floor: Unable to try/needs assist to keep balance From Standing Position, Turn to Look Behind Over each Shoulder: Turn sideways only but maintains balance Turn 360 Degrees: Needs assistance while turning Standing Unsupported, Alternately Place Feet on Step/Stool: Able to complete >2 steps/needs minimal assist Standing Unsupported, One Foot in Front: Needs help to step but can hold 15 seconds Standing on One Leg: Unable to try or needs assist to prevent fall Total Score: 25 Static Sitting Balance Static Sitting - Balance Support: No upper extremity supported;Feet supported Static Sitting - Level of Assistance: 6: Modified independent (Device/Increase time);5: Stand by assistance Dynamic Sitting Balance Dynamic Sitting - Balance Support: No upper extremity supported;Feet supported Dynamic Sitting - Level of Assistance: 5: Stand by assistance;6: Modified independent (Device/Increase time) Static Standing Balance Static Standing - Balance Support: Bilateral upper extremity supported Static Standing - Level of Assistance: 5: Stand by assistance Dynamic Standing Balance Dynamic Standing - Balance Support: Bilateral upper extremity supported;During functional activity Dynamic Standing - Level of Assistance: 4: Min assist Extremity Assessment    RLE Assessment RLE Assessment: Within Functional Limits General Strength Comments: 5/5 grossly LLE Assessment LLE Assessment: Within Functional Limits General Strength Comments: 5/5 grossly     Excell Seltzer, PT, DPT 03/27/2019, 3:57 PM

## 2019-03-27 NOTE — Progress Notes (Signed)
PHYSICAL MEDICINE & REHABILITATION PROGRESS NOTE   Subjective/Complaints:  Pt up with OT. Walking back from BR. Breathing is "better" today.  Slept fairly well last night. Still has tremor  ROS: Patient denies fever, rash, sore throat, blurred vision, nausea, vomiting, diarrhea, cough,   chest pain, joint or back pain, headache, or mood change.     Objective:   No results found. Recent Labs    03/26/19 0607 03/27/19 0515  WBC 6.5 5.4  HGB 9.0* 9.3*  HCT 29.5* 30.0*  PLT 101* 87*   Recent Labs    03/26/19 0607 03/27/19 0515  NA 141 142  K 3.8 4.0  CL 103 105  CO2 27 27  GLUCOSE 104* 115*  BUN 45* 45*  CREATININE 1.50* 1.48*  CALCIUM 8.9 8.9    Intake/Output Summary (Last 24 hours) at 03/27/2019 0917 Last data filed at 03/27/2019 0900 Gross per 24 hour  Intake 570 ml  Output 400 ml  Net 170 ml     Physical Exam: Vital Signs Blood pressure (!) 163/79, pulse 68, temperature 98.3 F (36.8 C), temperature source Oral, resp. rate 18, height 5\' 1"  (1.549 m), weight 79 kg, SpO2 94 %. Constitutional: No distress . Vital signs reviewed. HEENT: EOMI, oral membranes moist Neck: supple Cardiovascular: HR in 60's this am, regular    Respiratory: crackles at bases. Normal effort    GI: BS +, non-tender, non-distended  Musculoskeletal: Bilateral lower extremity edema Neurological: She is alertand oriented x 3 Motor: ongoing tremor, especially with intentional movements Bilateral lower extremities grossly 4/5 proximal to distal Ext- 1+ bilateral pre tib edema, stasis dermatitis changes ---stable appearance Skin: Skin iswarmand dry. Psychiatric: pleasant  Assessment/Plan: 1. Functional deficits secondary to rhabdomyolysis, debility which require 3+ hours per day of interdisciplinary therapy in a comprehensive inpatient rehab setting.  Physiatrist is providing close team supervision and 24 hour management of active medical problems listed  below.  Physiatrist and rehab team continue to assess barriers to discharge/monitor patient progress toward functional and medical goals  Care Tool:  Bathing    Body parts bathed by patient: Right arm, Left arm, Chest, Abdomen, Face, Right upper leg, Left upper leg, Front perineal area, Right lower leg, Left lower leg, Buttocks   Body parts bathed by helper: Buttocks     Bathing assist Assist Level: Supervision/Verbal cueing     Upper Body Dressing/Undressing Upper body dressing   What is the patient wearing?: Button up shirt    Upper body assist Assist Level: Set up assist    Lower Body Dressing/Undressing Lower body dressing      What is the patient wearing?: Pants     Lower body assist Assist for lower body dressing: Supervision/Verbal cueing     Toileting Toileting    Toileting assist Assist for toileting: Supervision/Verbal cueing     Transfers Chair/bed transfer  Transfers assist  Chair/bed transfer activity did not occur: Safety/medical concerns  Chair/bed transfer assist level: Contact Guard/Touching assist     Locomotion Ambulation   Ambulation assist      Assist level: Contact Guard/Touching assist Assistive device: Walker-rolling Max distance: 50'   Walk 10 feet activity   Assist     Assist level: Contact Guard/Touching assist Assistive device: Walker-rolling   Walk 50 feet activity   Assist Walk 50 feet with 2 turns activity did not occur: Safety/medical concerns  Assist level: Contact Guard/Touching assist Assistive device: Walker-rolling    Walk 150 feet activity   Assist Walk 150  feet activity did not occur: Safety/medical concerns  Assist level: Contact Guard/Touching assist Assistive device: Walker-rolling    Walk 10 feet on uneven surface  activity   Assist Walk 10 feet on uneven surfaces activity did not occur: Safety/medical concerns         Wheelchair     Assist Will patient use wheelchair at  discharge?: No Type of Wheelchair: Manual      Max wheelchair distance: 150'    Wheelchair 50 feet with 2 turns activity    Assist        Assist Level: Supervision/Verbal cueing   Wheelchair 150 feet activity     Assist     Assist Level: Supervision/Verbal cueing    Medical Problem List and Plan: 1.Functional mobility deficitssecondary to rhabdomyolysis, pneumonia, ETOH abuse.  Cont CIR PT, OT   -team conference today  -holding discharge pending medical  2. Antithrombotics: -DVT/anticoagulation:Pharmaceutical:Lovenox -antiplatelet therapy: ASA 3. Pain Management:tylenol prn. 4. Mood:team providing ego support  Neuropsych eval  -baseline anxiety present but seems to be managing fairly well at present -antipsychotic agents:  Seroquel and librium(used 10 mg bid for anxiety at home). 5. Neuropsych: This patientis?  Notfully capable of making decisions on herown behalf. 6. Skin/Wound Care:Routine pressure relief measures. 7. Fluids/Electrolytes/Nutrition:encourage PO, eating well as of late 8. SOB- orthopnea due to diastolic CHF.   -diuresis  -anxiety component 9. AKI with mild rhabdomyolysis:    -   -BUN/Cr elevated to 45/1.5  4/13---lasix held--> 45/1.48 4/14  -continue to hold lasix, encourage fluids for now   10.Chronic diastolic CHFwith exacerbation:  improved after diuresis   -holding lasix d/t #9   recent chest x-ray showed small pleural effusions Filed Weights   03/25/19 0545 03/26/19 0452 03/27/19 0500  Weight: 78.7 kg 77.8 kg 79 kg    -weight up sl today   -not sure where she should be "living" from a volume and HR standpoint.   -have requested cardiology consult but nobody saw patient yesterday 11. H/o anxiety/depression: Lost husband 12/19.Managed by Dr. Vilma Meckel and librium. 12. Abnormal LFTs:normal 4/1 13. Fall risk: Continue high low bed.  14.  Constipation    Improving  -continue probiotic  -sorbitol prn 15.  Essential hypertension/bradycardia  Hydralazine 50 3 times daily, increased to 75 3 times daily on 4/4   Held Lasix 20mg  per day due to AKI  Continue Toprol 75 daily  -HR back in 60's today   -  may need to back off metoprolol if she has consistent drops into the 40's   -have requested cards follow up prior to discharge   Vitals:   03/27/19 0632 03/27/19 0847  BP: (!) 163/79   Pulse: (!) 58 68  Resp: 18   Temp:    SpO2: 94%     16.  Acute blood loss anemia  Hemoglobin  9.3 4/14  Continue to monitor 17.  Thrombocytopenia  Platelets 101 4/13--->87k today 4/14  -have d/c'ed lovenox    LOS: 14 days A FACE TO [place] 03/27/2019, 9:17 AM

## 2019-03-27 NOTE — Progress Notes (Signed)
Recreational Therapy Session Note  Patient Details  Name: [name] [name] MRN: [id number] Date of Birth: Jul 06, 1936 Today's Date: 03/27/2019  Pain: no c/o Skilled Therapeutic Interventions/Progress Updates: Session focused on discharge planning, use of leisure time at discharge, activity analysis with potential modifications,home modifications/safety concerns in regards to her pets & identifying coping strategies.  Pt appreciated information and is excited to return home.  No formal goal written, session focused on pt education.  No further TR as pt is to discharge home with daughter potentially tomorrow.  Therapy/Group: Co-Treatment  Charan Prieto 03/27/2019, 3:24 PM

## 2019-03-27 NOTE — Progress Notes (Signed)
Speech Language Pathology Daily Session Note  Patient Details  Name: [name] [name] MRN: [id number] Date of Birth: 1936-02-12  Today's Date: 03/27/2019 SLP Individual Time: 1300-1400 SLP Individual Time Calculation (min): 60 min  Short Term Goals: Week 2: SLP Short Term Goal 1 (Week 2): STG=LTG due to remaining length of stay   Skilled Therapeutic Interventions:Skilled ST services focused on cognitive skills. SLP facilitated complex problem solving skills utilizing deductive reasoning puzzle, pt required max A verbal cues likely due to working memory. SLP facilitated semi-complex problem solving skills utilizing familiar card task, pt required min A verbal cues for problem solving fading to supervision A verbal cues and min A verbal cue for recall of three rules with external aid. Pt was left in room with call bell within reach and chair alarm set. ST recommends to continue skilled ST services.      Pain Pain Assessment Pain Scale: 0-10 Pain Score: 0-No pain  Therapy/Group: Individual Therapy  Jackqueline Aquilar  [facility] 03/27/2019, 2:10 PM

## 2019-03-27 NOTE — Progress Notes (Signed)
Physical Therapy Session Note  Patient Details  Name: [name] [name] MRN: [id number] Date of Birth: [birth year]  Today's Date: 03/27/2019 PT Individual Time: 6295-2841 PT Individual Time Calculation (min): 42 min   Short Term Goals: Week 2:  PT Short Term Goal 1 (Week 2): =LTG due to ELOS  Skilled Therapeutic Interventions/Progress Updates:    patient received up in [facility], very pleasant and willing to work with PT today. Transported her to PT gym totalA in [facility], then able to transfer to mat table with MinA and RW, performed sit to supine and rolling with S; when coming from supine to sitting, patient with increased effort and straining, took BP and found it to be 159/134 O2 94, HR 60, patient asymptomatic. Waited a few minutes at edge of table then rechecked, with BP now 129/63, possible that first number was machine error. Transferred back to her WC with MinA and RW, then able to gait train approximately 36f with RW and min guard. [facility] then able to pick up eraser from the floor [place]. She was left up in her WC with all needs met this morning.   Therapy Documentation Precautions:  Precautions Precautions: Fall Restrictions Weight Bearing Restrictions: No General:   Vital Signs:  Pain: Pain Assessment Pain Scale: 0-10 Pain Score: 0-No pain     Therapy/Group: Individual Therapy   KDeniece ReePT, DPT, CBIS  Supplemental Physical Therapist [facility]   Pager [phone] Rehab Office [phone]   03/27/2019, 1:00 PM

## 2019-03-27 NOTE — Patient Care Conference (Signed)
Inpatient RehabilitationTeam Conference and Plan of Care Update Date: 03/27/2019   Time: 2:10  PM    Patient Name: [name] [name]      Medical Record Number: [id number]  Date of Birth: Jul 26, 1936 Sex: Female         Room/Bed: 4W12C/4W12C-01 Payor Info: Payor: MEDICARE / Plan: MEDICARE PART A AND B / Product Type: *No Product type* /    Admitting Diagnosis: debility fall aki  Admit Date/Time:  03/13/2019  5:26 PM Admission Comments: No comment available   Primary Diagnosis:  <principal problem not specified> Principal Problem: <principal problem not specified>  Patient Active Problem List   Diagnosis Date Noted  . Aspiration pneumonia ([place])   . Acute blood loss anemia   . Chronic diastolic congestive heart failure ([place])   . Debility 03/13/2019  . Hypothermia 03/09/2019  . Elevated liver enzymes 03/09/2019  . Thrombocytopenia ([place]) 03/09/2019  . Hyperkalemia 07/23/2017  . AKI (acute kidney injury) ([place]) 07/23/2017  . 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 Fall 06/26/2017  . Intractable nausea and vomiting 03/11/2017  . Acute lower UTI 03/11/2017  . Carotid stenosis 12/28/2016  . Sedative, hypnotic, or anxiolytic withdrawal delirium 11/15/2016  . Paroxysmal atrial fibrillation ([facility])   . Cerebrovascular disease   . Fever   . Acute encephalopathy 11/10/2016  . Hypothyroidism 11/10/2016  . Hypertensive urgency 11/10/2016  . Altered mental state 11/10/2016  . Seizure ([place]) 11/10/2016  . Chronic diastolic CHF (congestive heart failure) ([place])   . Left carotid stenosis 10/29/2016  . S/P total knee replacement 03/10/2016  . 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 Ileus ([place]) 09/05/2014  . Leukocytosis 09/05/2014  . Vomiting 09/04/2014  . Pulmonary edema 09/04/2014  . Hypokalemia 08/28/2014  . Enteritis due to Clostridium difficile 08/27/2014  . 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 Protein calorie malnutrition ([place]) 08/24/2014  .  Obstructive jaundice 08/23/2014  . Acute cholangitis 08/23/2014  . ARF (acute renal failure) ([place]) 08/23/2014  . Coagulopathy ([place]) 08/23/2014  . Back pain   . Arthritis   . Hypertension   . GERD (gastroesophageal reflux disease)   . Anxiety   . IBS (irritable bowel syndrome)   . Hernia of Right posterior flank 04/18/2012    Expected Discharge Date: Expected Discharge Date: 03/29/19  Team Members Present: Physician leading conference: Dr. Alger Simons Social Worker Present: Lennart Pall, LCSW Nurse Present: Mohammed Kindle, LPN PT Present: Other (comment)(Taylor Ervin Knack, PT) OT Present: Amy Rounds, OT SLP Present: Charolett Bumpers, SLP PPS Coordinator present : Gunnar Fusi     Current Status/Progress Goal Weekly Team Focus  Medical   volume issues. now with AKI, bradycardia, thrombocytopenia, appears to be sensitive volume-wise, have asked for cardiology input  see prior  avoiding accessive diuresis, trying to find appropriate volume/medications   Bowel/Bladder   continent of bowel & bladder, LBM 03/26/19  Maintain regular bowel/bladder function with supervision  monitor & assist as needed   Swallow/Nutrition/ Hydration             ADL's   Close supervision overall with occasional steadying assist. Using AE for bathing/dressing  Supervision  functional activity tolerance, ADl re-training, d/c planning   Mobility   CGA transfers with RW, CGA gait 50' with RW, Supervision w/c mobility, min A step with RW  Supervision overall, min A stairs  endurance, strengthening, gait, step/stairs   Communication             Safety/Cognition/ Behavioral  Observations  Min-Supervision A, more towards supervision  supervision   continue to address higher level deficits    Pain   no c/o pain, has tylenol prn  pain scale <2/10  assess & treat as needed   Skin   MASD to the groin & perineum,, has gerdharts cream ordered  no skin break down while on rehab  assess q shift      *See Care Plan  and progress notes for long and short-term goals.     Barriers to Discharge  Current Status/Progress Possible Resolutions Date Resolved   Physician    Medical stability        see medical progress notes      Nursing                  PT  Medical stability;Home environment access/layout                 OT                  SLP                SW                Discharge Planning/Teaching Needs:  Pt to d/c home with daughter to stay and provide 24/7 supervision.  Teaching to be planned closer to d/c.   Team Discussion:  Awaiting card consult/ input for medical clearance to d/c.  Monitoring labs.  Cont x 2.  Close supervision to steady assist overall due to tremors and endurance issues.  SW to schedule family ed for tomorrow.  Revisions to Treatment Plan:  NA    Continued Need for Acute Rehabilitation Level of Care: The patient requires daily medical management by a physician with specialized training in physical medicine and rehabilitation for the following conditions: Daily direction of a multidisciplinary physical rehabilitation program to ensure safe treatment while eliciting the highest outcome that is of practical value to the patient.: Yes Daily medical management of patient stability for increased activity during participation in an intensive rehabilitation regime.: Yes Daily analysis of laboratory values and/or radiology reports with any subsequent need for medication adjustment of medical intervention for : Neurological problems;Blood pressure problems;Cardiac problems;Renal problems   I attest that I was present, lead the team conference, and concur with the assessment and plan of the team.   Lennart Pall 03/27/2019, 3:39 PM   Team conference was held via web/ teleconference due to [place] - 19

## 2019-03-27 NOTE — Progress Notes (Signed)
Physical Therapy Session Note  Patient Details  Name: [name] [name] MRN: [id number] Date of Birth: March 14, 1936  Today's Date: 03/27/2019 PT Individual Time: 1030-1130 PT Individual Time Calculation (min): 60 min   Short Term Goals: Week 2:  PT Short Term Goal 1 (Week 2): =LTG due to ELOS  Skilled Therapeutic Interventions/Progress Updates:    Pt received seated on toilet, agreeable to PT session. No complaints of pain. Toilet transfer with Supervision, Supervision for standing balance while performing pericare and clothing management. Sit to stand to RW with Supervision to CGA throughout session. Ascend/descend one 6" curb with RW and min A, v/c for safe RW management. Car transfer with CGA with v/c for safe transfer technique. Berg Balance Test, 25/56, high fall risk. Pt continues to have increase in tremors/shaking in standing with onset of fatigue. Pt left seated in w/c in room with needs in reach at end of session. Co-treatment session with RT.  Therapy Documentation Precautions:  Precautions Precautions: Fall Restrictions Weight Bearing Restrictions: No Pain: Pain Assessment Pain Scale: 0-10 Pain Score: 0-No pain    Therapy/Group: Individual Therapy   Excell Seltzer, PT, DPT  03/27/2019, 3:50 PM

## 2019-03-27 NOTE — Progress Notes (Signed)
Occupational Therapy Session Note  Patient Details  Name: TRIVIA HEFFELFINGER MRN: [id number] Date of Birth: 06/05/1936  Today's Date: 03/27/2019 OT Individual Time: 5537-4827 OT Individual Time Calculation (min): 60 min    Short Term Goals: Week 2:  OT Short Term Goal 1 (Week 2): STG=LTG due to LOS  Skilled Therapeutic Interventions/Progress Updates:    Pt seen for OT session focusing on ADL re-training and functional transfers. Pt attempting to eat breakfast slouched in bed upon arrival, agreeable to tx session and denting pain. Transferred to EOB with mod A using hospital bed functions with VCs for technique and use of chuck pad to adjust hips. She ate remainder of breakfast seated on the EOB. Provided with built up gripper and positioning cues due to increase in hand tremors.  She completed sit>Stand and functional ambulation with RW throughout session with close supervision. She ambulated into/out of  bathroom and completed toileting task with guarding assist and VCs for RW management in functional context.  Dressing completed with max A for time management from w/c level Pt taken to ADL apartment total A in w/c for time and energy conservation. Completed simulated tub/shower transfer utilizing tub transfer bench with close supervision and VCs for technique.  Pt requiring significantly increased time and rest breaks throughout session with increase in full body tremors this morning.  Pt returned to room at end of session, left seated with all needs in reach.  Therapy Documentation Precautions:  Precautions Precautions: Fall Restrictions Weight Bearing Restrictions: No Pain:   No/denies pain   Therapy/Group: Individual Therapy  Jermel Artley L 03/27/2019, 7:01 AM

## 2019-03-27 NOTE — Discharge Summary (Signed)
Physician Discharge Summary  Patient ID: [name] [name] MRN: [id number] DOB/AGE: May 12, 1936 83 y.o.  Admit date: 03/13/2019 Discharge date: 04/03/2019  Discharge Diagnoses:  Principal Problem:   Sepsis [facility]) Active Problems:   Hypertension   Thrombocytopenia ([place])   Debility   Acute blood loss anemia   Chronic diastolic congestive heart failure (HCC)   Bradycardia   Somnolence   Discharged Condition: stable    Significant Diagnostic Studies: Dg Chest 2 View  Result Date: 03/30/2019 CLINICAL DATA:  Fever. EXAM: CHEST - 2 VIEW COMPARISON:  Radiographs of March 29, 2019. FINDINGS: Stable cardiomegaly. Mild central pulmonary vascular congestion is noted. Stable left basilar atelectasis or infiltrate is noted with small pleural effusion. No pneumothorax is noted. Status post surgical repair of old left clavicular fracture. Status post left shoulder arthroplasty. Postsurgical changes are seen involving the lower thoracic spine. IMPRESSION: Stable cardiomegaly with mild central pulmonary vascular congestion stable left basilar subsegmental atelectasis or infiltrate is noted with small left pleural effusion. Electronically Signed   By: Marijo Conception M.D.   On: 03/30/2019 15:15   Dg Chest 2 View  Result Date: 03/29/2019 CLINICAL DATA:  Headache and unresponsive. Hypertension. CHF. Renal failure. EXAM: CHEST - 2 VIEW COMPARISON:  For 620 FINDINGS: Thoracic spine fixation. Left shoulder arthroplasty. Midline trachea. Mild cardiomegaly, accentuated by AP portable frontal radiograph and extremely low lung volumes. Small left pleural effusion persists. No pneumothorax. Interstitial prominence is at least partially felt to be due to low lung volumes. Similar left base airspace disease. Mild right base atelectasis, improved. IMPRESSION: Minimal improvement in right base aeration period. Persistent left base airspace disease, suspicious for infection or aspiration. Similar small left pleural effusion.  Electronically Signed   By: Abigail Miyamoto M.D.   On: 03/29/2019 18:18   Dg Chest 2 View  Result Date: 03/19/2019 CLINICAL DATA:  Shortness of breath. EXAM: CHEST - 2 VIEW COMPARISON:  03/11/2019 FINDINGS: The heart size and mediastinal contours are within normal limits. The lung volumes are low bilaterally with bibasilar atelectasis present, left greater than right. Cannot exclude subtle infiltrate at the left lung base. There are tiny bilateral pleural effusions. The visualized skeletal structures are unremarkable. IMPRESSION: Bibasilar atelectasis. Cannot exclude infiltrate at the left lung base. Small bilateral pleural effusions. Electronically Signed   By: Aletta Edouard M.D.   On: 03/19/2019 09:35   Dg Chest 2 View  Result Date: 03/10/2019 CLINICAL DATA:  Abnormal lung sounds EXAM: CHEST - 2 VIEW COMPARISON:  03/09/2019 FINDINGS: Lungs are essentially clear. Mild eventration of the right hemidiaphragm. No pleural effusion or pneumothorax. The heart is normal in size. Lower thoracic/upper lumbar fixation hardware, incompletely visualized. Status post ORIF of the left lateral clavicle. Left shoulder arthroplasty. IMPRESSION: No evidence of acute cardiopulmonary disease. Electronically Signed   By: Julian Hy M.D.   On: 03/10/2019 06:49   Dg Chest 2 View  Result Date: 03/09/2019 CLINICAL DATA:  Weakness and fall. EXAM: CHEST - 2 VIEW COMPARISON:  06/26/2017 FINDINGS: The heart size and mediastinal contours are within normal limits. There is no evidence of pulmonary edema, consolidation, pneumothorax, nodule or pleural fluid. Stable appearance of left clavicular reconstruction plate, left shoulder arthroplasty and spinal fusion rods. IMPRESSION: No active cardiopulmonary disease. Electronically Signed   By: Aletta Edouard M.D.   On: 03/09/2019 11:26   Ct Head Wo Contrast  Result Date: 03/29/2019 CLINICAL DATA:  Headaches. Recent fall. EXAM: CT HEAD WITHOUT CONTRAST TECHNIQUE: Contiguous axial  images were obtained from  the base of the skull through the vertex without intravenous contrast. COMPARISON:  03/09/2019 FINDINGS: Brain: There is no evidence of acute infarct, intracranial hemorrhage, mass, midline shift, or extra-axial fluid collection. Mild cerebral atrophy is again noted. Cerebral white matter hypodensities are unchanged and nonspecific but compatible with mild chronic small vessel ischemic disease. Chronic lacunar infarcts are again seen in the left basal ganglia. Vascular: Calcified atherosclerosis at the skull base. No hyperdense vessel. Skull: No fracture or focal osseous lesion. Sinuses/Orbits: Small volume abnormal soft tissue inferiorly in the left orbit, extraconal in location and unchanged from the recent prior CT though smaller than on a 2018 MRI. Unchanged 5 mm nodular soft tissue focus between the left globe and medial rectus muscle. Bilateral cataract extraction. Visualized paranasal sinuses and mastoid air cells are clear. Other: None. IMPRESSION: 1. No evidence of acute intracranial abnormality. 2. Mild chronic small vessel ischemic disease. Electronically Signed   By: Logan Bores M.D.   On: 03/29/2019 18:02   Ct Head Wo Contrast  Result Date: 03/09/2019 CLINICAL DATA:  Unwitnessed fall. EXAM: CT HEAD WITHOUT CONTRAST CT CERVICAL SPINE WITHOUT CONTRAST TECHNIQUE: Multidetector CT imaging of the head and cervical spine was performed following the standard protocol without intravenous contrast. Multiplanar CT image reconstructions of the cervical spine were also generated. COMPARISON:  CT scan of October 05, 2018. FINDINGS: CT HEAD FINDINGS Brain: Mild diffuse cortical atrophy is noted. Minimal chronic ischemic white matter disease is noted. No mass effect or midline shift is noted. Ventricular size is within normal limits. There is no evidence of mass lesion, hemorrhage or acute infarction. Vascular: No hyperdense vessel or unexpected calcification. Skull: Normal. Negative for  fracture or focal lesion. Sinuses/Orbits: No acute finding. Other: None. CT CERVICAL SPINE FINDINGS Alignment: Normal. Skull base and vertebrae: No acute fracture. No primary bone lesion or focal pathologic process. Soft tissues and spinal canal: No prevertebral fluid or swelling. No visible canal hematoma. Disc levels: Severe degenerative disc disease is noted at C5-6 and C6-7. Anterior and posterior osteophyte formation is noted at these levels. Upper chest: Negative. Other: Degenerative changes are seen involving posterior facet joints bilaterally. IMPRESSION: Mild diffuse cortical atrophy. Minimal chronic ischemic white matter disease. No acute intracranial abnormality seen. Severe multilevel degenerative disc disease. No acute abnormality seen in the cervical spine. Electronically Signed   By: Marijo Conception, M.D.   On: 03/09/2019 11:23   Ct Cervical Spine Wo Contrast  Result Date: 03/09/2019 CLINICAL DATA:  Unwitnessed fall. EXAM: CT HEAD WITHOUT CONTRAST CT CERVICAL SPINE WITHOUT CONTRAST TECHNIQUE: Multidetector CT imaging of the head and cervical spine was performed following the standard protocol without intravenous contrast. Multiplanar CT image reconstructions of the cervical spine were also generated. COMPARISON:  CT scan of October 05, 2018. FINDINGS: CT HEAD FINDINGS Brain: Mild diffuse cortical atrophy is noted. Minimal chronic ischemic white matter disease is noted. No mass effect or midline shift is noted. Ventricular size is within normal limits. There is no evidence of mass lesion, hemorrhage or acute infarction. Vascular: No hyperdense vessel or unexpected calcification. Skull: Normal. Negative for fracture or focal lesion. Sinuses/Orbits: No acute finding. Other: None. CT CERVICAL SPINE FINDINGS Alignment: Normal. Skull base and vertebrae: No acute fracture. No primary bone lesion or focal pathologic process. Soft tissues and spinal canal: No prevertebral fluid or swelling. No visible  canal hematoma. Disc levels: Severe degenerative disc disease is noted at C5-6 and C6-7. Anterior and posterior osteophyte formation is noted at these levels. Upper  chest: Negative. Other: Degenerative changes are seen involving posterior facet joints bilaterally. IMPRESSION: Mild diffuse cortical atrophy. Minimal chronic ischemic white matter disease. No acute intracranial abnormality seen. Severe multilevel degenerative disc disease. No acute abnormality seen in the cervical spine. Electronically Signed   By: Marijo Conception, M.D.   On: 03/09/2019 11:23   Mr Brain Wo Contrast  Result Date: 03/30/2019 CLINICAL DATA:  Slurred speech.  Word-finding deficits. EXAM: MRI HEAD WITHOUT CONTRAST TECHNIQUE: Multiplanar, multiecho pulse sequences of the brain and surrounding structures were obtained without intravenous contrast. COMPARISON:  Head CT 03/29/2019 FINDINGS: Brain: There is no evidence of acute infarct, intracranial hemorrhage, mass, midline shift, or extra-axial fluid collection. There is mild cerebral atrophy. T2 hyperintensities in the cerebral white matter and pons have slightly progressed from the prior MRI and are nonspecific but compatible with mild chronic small vessel ischemic disease. Chronic left basal ganglia lacunar infarcts are new from the prior MRI. Vascular: Major intracranial vascular flow voids are preserved. Skull and upper cervical spine: Unremarkable bone marrow signal. Sinuses/Orbits: Bilateral cataract extraction. Clear paranasal sinuses. Small right mastoid effusion. Other: None. IMPRESSION: 1. No acute intracranial abnormality. 2. Mild chronic small vessel ischemic disease, progressed from 2018. Electronically Signed   By: Logan Bores M.D.   On: 03/30/2019 14:55   Dg Chest Port 1 View  Result Date: 03/11/2019 CLINICAL DATA:  Shortness of breath EXAM: PORTABLE CHEST 1 VIEW COMPARISON:  03/10/2019 FINDINGS: Mild left infrahilar/lower lobe opacity suspected, although equivocal.  Increased interstitial markings without frank interstitial edema. No pleural effusion or pneumothorax. Cardiomegaly. Status post ORIF of the left clavicle. Thoracolumbar fixation hardware, incompletely visualized. IMPRESSION: Mild left infrahilar/lower lobe opacity suspected, although equivocal. Pneumonia is not excluded in the appropriate clinical setting. Electronically Signed   By: Julian Hy M.D.   On: 03/11/2019 06:57    Labs:  Basic Metabolic Panel: Recent Labs  Lab 03/29/19 0628 03/29/19 1848 03/30/19 0104 03/31/19 0610 04/01/19 0610 04/02/19 0722  NA 143 144 146* 147* 143 145  K 4.0 4.0 3.8 3.7 3.8 3.7  CL 101 103 104 106 106 106  CO2 29 30 29 27 25 28   GLUCOSE 121* 78 118* 105* 97 107*  BUN 45* 46* 47* 32* 33* 34*  CREATININE 1.17* 1.21* 1.39* 1.24* 1.11* 1.11*  CALCIUM 9.7 9.5 9.0 9.7 9.5 9.7    CBC: Recent Labs  Lab 03/29/19 1848 03/30/19 0104 03/31/19 0610 04/01/19 0610 04/02/19 0722  WBC 7.9 7.3 5.9 7.9 8.8  NEUTROABS 6.4 5.9  --   --   --   HGB 9.8* 9.7* 9.9* 9.6* 10.2*  HCT 31.5* 31.4* 32.4* 32.3* 32.9*  MCV 97.8 98.4 102.2* 100.3* 98.8  PLT 92* 88* 91* 99* 109*    CBG: Recent Labs  Lab 03/29/19 1639 04/02/19 1732  GLUCAP 97 107*    Brief HPI:   SHINITA MAC is an 83 year old female with history of HTN, PAF-no AC due to falls, psoriatic arthritis, anxiety/depression, chronic diastolic CHF who was admitted on 03/09/19 after falling in the middle of the night. She laid on the floor till family able to get help. She was hypothermic at admission with AKI due to rhabdomyolysis and started on antibiotics due to concerns of sepsis. Infectious work up negative and she was transitioned to augment due to concerns of aspiration PNA. She required CIWA protocol and transitioned to librium 10 mg tid. She has had issues with fluid overload treated with diuresis as well as A  fib with RVR that resolved with addition of cardizem. She was noted to be deconditioned  and CIR recommended for follow up therapy.    Hospital Course: JENNELL JANOSIK was admitted to rehab 03/13/2019 for inpatient therapies to consist of PT, ST and OT at least three hours five days a week. Past admission physiatrist, therapy team and rehab RN have worked together to provide customized collaborative inpatient rehab. She completed antibiotic course of Augmentin on 4/5 without SE. nd respiratory status has been stable. Blood pressures were monitored on tid basis and have been labile therefore hydralazine was titrated upwards.  Chronic diastolic CHF has been monitored with daily weight and home dose furosemide was resumed on 4/6 due to SOB felt to be due to fluid overload.   Lasix was titrated up to 40 mg briefly but she developed acute on chronic renal failure with rise in SCr 1.5. She has also had intermittent issues with bradycardia therefore cardiology was consulted for input.  Dr. Burt Knack recommended discontinuation of Cardizem due to bradycardia as well as increased doses of lasix X 2 days. Weights have been monitored daily and is down to 163 lbs.    Serial CBC showed that H/H is stable without signs of bleeding. Platelets have been variable but are back on downward trend. Mentation had improved and confusion had resolved. She was making steady progress but on pm of 4/16, she was found to be hypothermic. Thyroid studies were ordered for work up. She developed decrease in LOC and has had reports of HA that day. Stat CT head ordered to rule out  Stroke or bleed. CXR, UA/UCS ordered for work up. ABG was negative for hypercarbia. Albumin continues to be low at 2.9 and ammonia levels are WNL. CXR is showing improvement overall. Dr. Duanne Limerick was consulted for input and recommended full sepsis work up.   She was started on IV Vanc/Cefepime empirically and work up revealed sepsis due to E coli/Klebsiella UTI.  Blood cultures were negative and she was discharged on Keflex to complete 7 total day antibiotic  therapy. MRI brain done for further work up and was negative for acute changes. TSH and cortisol levels WNL. Librium was decreased to 5 mg bid prn and lasix was held briefly.  Hypernatremia has resolved and acute renal failure is resolving. CBC showed that H/H is stale and thrombocytopenia is at baseline. MBS was completed for objective evaluation and showed "functional swallow with great airway protection". Lethargy has resolved with clearing of mentation and she is back to baseline. Her temperatures are normalizing and renal status will continue to improve with advancement of liquids to thins. She has progressed to supervision level and will continue to receive follow up [place], [place], [place] and [place]  by  [place] after discharge.    Rehab course: During patient's stay in rehab weekly team conferences were held to monitor patient's progress, set goals and discuss barriers to discharge. At admission, patient required min to mod assist with mobility and min to max assist with basic self care tasks. She exhibited mild cognitive deficits with slight confusion as well as mild higher level cognitive deficits affecting complex tasks and recall. She  has had improvement in activity tolerance, balance, postural control as well as ability to compensate for deficits.  She is able to complete ADL tasks with supervision. She requires supervision for transfers and to ambulate 150' with RW/cues. Patient is HOH which adversely affects her communication. MoCA testing revealed score of 12/30. Her cognition  has improved and she is able to complete cognitive tasks at modified independent level.   She is tolerating regular diet without S/S of aspiration.  Hands on family education was completed with daughter who can provide assistance needed after discharge.    Disposition: Home.   Diet: Low fat, Low cholesterol, Low salt.   Special Instructions: 1. HHRN to draw BMET/CBC pm 04/27 with results to Dr. Delfina Redwood. 2. Needs  to avoid alcohol completely.     Discharge Instructions    Ambulatory referral to Physical Medicine Rehab   Complete by:  As directed    2-4 weeks follow up appointment     Allergies as of 04/03/2019      Reactions   Tetanus Toxoids Swelling, Other (See Comments)   Arm (site) became swollen as a child   Snake Antivenin [antivenin Crotalidae Polyvalent] Other (See Comments)   Just can't take per daughter   Yellow Dyes (non-tartrazine) Itching, Other (See Comments)   Makes patient nervous       Medication List    STOP taking these medications   amoxicillin-clavulanate 875-125 MG tablet Commonly known as:  Augmentin   atenolol 50 MG tablet Commonly known as:  TENORMIN   diltiazem 90 MG tablet Commonly known as:  CARDIZEM   potassium chloride SA 20 MEQ tablet Commonly known as:  K-DUR     TAKE these medications   acetaminophen 325 MG tablet Commonly known as:  TYLENOL Take 1-2 tablets (325-650 mg total) by mouth every 4 (four) hours as needed for mild pain. What changed:    how much to take  when to take this  reasons to take this   aspirin EC 81 MG tablet Take 81 mg by mouth daily.   cephALEXin 250 MG capsule Commonly known as:  KEFLEX Take 1 capsule (250 mg total) by mouth every 8 (eight) hours. Notes to patient:  antibiotic   chlordiazePOXIDE 5 MG capsule Commonly known as:  LIBRIUM Take 1 capsule (5 mg total) by mouth 2 (two) times daily as needed for anxiety. What changed:    how much to take  when to take this  reasons to take this Notes to patient:  Note decrease in dose and has been changed to as needed to avoid sedation.    FLUoxetine 20 MG capsule Commonly known as:  PROZAC Take 1 capsule (20 mg total) by mouth daily.   fluticasone 50 MCG/ACT nasal spray Commonly known as:  FLONASE Place 1 spray into both nostrils daily.   folic acid 1 MG tablet Commonly known as:  FOLVITE Take 1 tablet (1 mg total) by mouth daily.   furosemide 20 MG  tablet Commonly known as:  LASIX Take 1 tablet (20 mg total) by mouth daily.   hydrALAZINE 25 MG tablet Commonly known as:  APRESOLINE Take 3 tablets (75 mg total) by mouth every 8 (eight) hours.   hyoscyamine 0.375 MG 12 hr tablet Commonly known as:  LEVBID Take 1 tablet (0.375 mg total) by mouth every 12 (twelve) hours as needed for cramping. What changed:    when to take this  reasons to take this   ketotifen 0.025 % ophthalmic solution Commonly known as:  ZADITOR Place 1 drop into both eyes 2 (two) times daily. Notes to patient:  For allergies/dry eyes   levothyroxine 100 MCG tablet Commonly known as:  SYNTHROID Take 1 tablet (100 mcg total) by mouth daily at 6 (six) AM. What changed:    medication strength  how  much to take  when to take this Notes to patient:  Take on empty stomach   metoprolol succinate 25 MG 24 hr tablet Commonly known as:  TOPROL-XL Take 3 tablets (75 mg total) by mouth daily.   pantoprazole 40 MG tablet Commonly known as:  PROTONIX Take 1 tablet (40 mg total) by mouth daily.   QUEtiapine 25 MG tablet Commonly known as:  SEROQUEL Take 1 tablet (25 mg total) by mouth at bedtime. Notes to patient:  To  Help with sleep and  anxiety   saccharomyces boulardii 250 MG capsule Commonly known as:  FLORASTOR Take 1 capsule (250 mg total) by mouth 2 (two) times daily. Notes to patient:  Probiotic to help stomach   senna 8.6 MG Tabs tablet Commonly known as:  SENOKOT Take 2 tablets (17.2 mg total) by mouth at bedtime. Notes to patient:  For constipation   simvastatin 10 MG tablet Commonly known as:  ZOCOR Take 1 tablet (10 mg total) by mouth daily at 6 PM. What changed:  when to take this   thiamine 100 MG tablet Take 1 tablet (100 mg total) by mouth daily.   venlafaxine XR 37.5 MG 24 hr capsule Commonly known as:  EFFEXOR-XR Take 1 capsule (37.5 mg total) by mouth daily with breakfast.   zolpidem 5 MG tablet Commonly known as:   AMBIEN Take 1 tablet (5 mg total) by mouth at bedtime as needed for sleep. What changed:    medication strength  how much to take  when to take this  reasons to take this      Follow-up Information    [name], MD. Call.   Specialty:  Internal Medicine Why:  for post hospital follow up in 1-2 weeks Contact information: [address] [place] [address] [phone]        Meredith Staggers, MD Follow up.   Specialty:  Physical Medicine and Rehabilitation Why:  office will call you with appt for virtual visit.  Contact information: [address] [place] [address] [phone]        Sherren Mocha, MD. Call.   Specialty:  Cardiology Why:  as needed Contact information: [address] [place] [address] [phone]           Signed: Bary Leriche 04/04/2019, 2:48 PM

## 2019-03-27 NOTE — Progress Notes (Signed)
Social Work Patient ID: [name] [name], female   DOB: [birth year], 83 y.o.   MRN: [id number]  Have reviewed team conference with pt and daughter.  Both aware that d/c now targeted for Thursday pending cardiology input.  Have scheduled for daughter to be here tomorrow afternoon at 12:30 to complete family ed.  Daughter with concern/ questions about medical issues and have asked Reesa Chew, PA to follow up with her.  Continue to follow.  Keilly Fatula, LCSW

## 2019-03-28 ENCOUNTER — Inpatient Hospital Stay (HOSPITAL_COMMUNITY): Payer: Self-pay | Admitting: Speech Pathology

## 2019-03-28 ENCOUNTER — Inpatient Hospital Stay (HOSPITAL_COMMUNITY): Payer: Self-pay | Admitting: Occupational Therapy

## 2019-03-28 ENCOUNTER — Inpatient Hospital Stay (HOSPITAL_COMMUNITY): Payer: Self-pay | Admitting: Physical Therapy

## 2019-03-28 LAB — BASIC METABOLIC PANEL
Anion gap: 14 (ref 5–15)
BUN: 43 mg/dL — ABNORMAL HIGH (ref 8–23)
CO2: 29 mmol/L (ref 22–32)
Calcium: 9.6 mg/dL (ref 8.9–10.3)
Chloride: 102 mmol/L (ref 98–111)
Creatinine, Ser: 1.26 mg/dL — ABNORMAL HIGH (ref 0.44–1.00)
GFR calc Af Amer: 47 mL/min — ABNORMAL LOW (ref >60)
GFR calc non Af Amer: 40 mL/min — ABNORMAL LOW (ref >60)
Glucose, Bld: 106 mg/dL — ABNORMAL HIGH (ref 70–99)
Potassium: 3.9 mmol/L (ref 3.5–5.1)
Sodium: 145 mmol/L (ref 135–145)

## 2019-03-28 LAB — CBC
HCT: 32.7 % — ABNORMAL LOW (ref 36.0–46.0)
Hemoglobin: 10.5 g/dL — ABNORMAL LOW (ref 12.0–15.0)
MCH: 31.7 pg (ref 26.0–34.0)
MCHC: 32.1 g/dL (ref 30.0–36.0)
MCV: 98.8 fL (ref 80.0–100.0)
Platelets: 85 10*3/uL — ABNORMAL LOW (ref 150–400)
RBC: 3.31 MIL/uL — ABNORMAL LOW (ref 3.87–5.11)
RDW: 13.1 % (ref 11.5–15.5)
WBC: 5 10*3/uL (ref 4.0–10.5)
nRBC: 0 % (ref 0.0–0.2)

## 2019-03-28 NOTE — Progress Notes (Signed)
Physical Therapy Session Note  Patient Details  Name: [name] [name] MRN: [id number] Date of Birth: 07-Jul-1936  Today's Date: 03/28/2019 PT Individual Time: 1330-1400 PT Individual Time Calculation (min): 30 min   Short Term Goals: Week 2:  PT Short Term Goal 1 (Week 2): =LTG due to ELOS  Skilled Therapeutic Interventions/Progress Updates:    Pt received seated in w/c in room, agreeable to PT session. Pt's daughter Juliann Pulse present for hands-on family training session. Pt is CGA for sit to stand transfers to RW. Pt is min A for ascending/descending one curb step with RW with v/c for safe technique. Performed demonstration with patient then had patient's daughter perform return demo. Juliann Pulse requires min cueing for safety in assisting pt. Also reviewed handout of HEP and discussed continuing to get up and take short walks around a room of the house each day to continue to improve endurance. Education with patient and her daughter than going upstairs in order to take a shower is not recommended due to pt fatiguing quickly with activity and the amount of energy it would require to go up/down stairs as well as shower would be significant. Pt left seated in w/c in room with needs in reach, daughter present at end of session.  Therapy Documentation Precautions:  Precautions Precautions: Fall Restrictions Weight Bearing Restrictions: No Pain: Pain Assessment Pain Scale: 0-10 Pain Score: 0-No pain    Therapy/Group: Individual Therapy   Excell Seltzer, PT, DPT  03/28/2019, 3:38 PM

## 2019-03-28 NOTE — Progress Notes (Signed)
Pt needed to be cathed this morning. Last night she kept voiding very little amounts frequently. Scanned her post void this morning and had 814mL, got out 1071mL with a straight cath.

## 2019-03-28 NOTE — Progress Notes (Signed)
Speech Language Pathology Daily Session Note  Patient Details  Name: AVAYA MCJUNKINS MRN: [id number] Date of Birth: 1936-11-01  Today's Date: 03/28/2019 SLP Individual Time: 1130-1200 SLP Individual Time Calculation (min): 30 min SLP Individual Time: 1300-1330 SLP Individual Time Calculation (min): 30 min  Short Term Goals: Week 2: SLP Short Term Goal 1 (Week 2): STG=LTG due to remaining length of stay   Skilled Therapeutic Interventions: Pt was seen for skilled ST intervention targeting aforementioned goals and family education with pt's daughter.   Session 1: The Montreal Cognitive Assessment (MoCA) was administered for diagnostic treatment to determine current cognitive linguistic function. Pt scored 12/30, indicating marked cognitive impairment. Points were lost on executive function, clock drawing, animal naming, immediate and delayed recall, attention, sentence repetition, and abstract reasoning. Of note, pt is significantly hard of hearing, which likely adversely affects her communicative effectiveness.   Session 2: Family education was completed with pt and her daughter. MoCA test results were reviewed and functional implication discussed. 24 hour supervision is recommended at discharge, given basic and higher level cognitive impairments and poor awareness of deficits.   Pain Pain Assessment Pain Scale: 0-10 Pain Score: 0-No pain  Therapy/Group: Individual Therapy  Zaylah Blecha B. Quentin Ore, [facility], CCC-SLP Speech Language Pathologist  [name] 03/28/2019, 2:49 PM

## 2019-03-28 NOTE — Progress Notes (Signed)
Occupational Therapy Session Note  Patient Details  Name: [name] [name] MRN: [id number] Date of Birth: May 14, 1936  Today's Date: 03/28/2019 OT Individual Time: 1230-1300 OT Individual Time Calculation (min): 30 min    Short Term Goals: Week 2:  OT Short Term Goal 1 (Week 2): STG=LTG due to LOS  Skilled Therapeutic Interventions/Progress Updates:    Pt seen for OT session focusing on caregiver training and functional transfers. Pt sitting up in w/c upon arrival with daughter, her primary caregiver present. Pt denying pain and agreeable to tx session. Education provided throughout session regarding pt's CLOF, OT/PT goals, AE and how to order (hip kit), DME, importance of participation and independence with ADLs, continuum of care, and d/c planning.  Pt taken to ADL apartment total A in w/c for time and energy conservation. Education/demonstration provided for tub transfer bench transfer. Following demonstration, pt return demonstrated with supervision and max multi-modal cuing for effective scooting technique to slide along bench and head/hip relationship. Pt's daughter then reported bathroom is on 2nd level of home, requiring pt to walk up 15 steps to get to it. Discussed pt's CLOF and discussion with PT regarding pt's status with stairs. Recommending pt and caregiver wait to go up the stairs until [facility] therapist can assist. Pt returned to room at end of session, left seated in w/c with daughter present awaiting hand off to SLP.   Therapy Documentation Precautions:  Precautions Precautions: Fall Restrictions Weight Bearing Restrictions: No Pain:   No/denies pain   Therapy/Group: Individual Therapy  Keslee Harrington L 03/28/2019, 7:08 AM

## 2019-03-28 NOTE — Progress Notes (Signed)
[facility] PHYSICAL MEDICINE & REHABILITATION PROGRESS NOTE   Subjective/Complaints:  In bed. Given bed pan to empty bowels. Had to be straight cathed this morning.   ROS: Patient denies fever, rash, sore throat, blurred vision, nausea, vomiting, diarrhea, cough, shortness of breath or chest pain,  , headache, or mood change.      Objective:   No results found. Recent Labs    03/26/19 0607 03/27/19 0515  WBC 6.5 5.4  HGB 9.0* 9.3*  HCT 29.5* 30.0*  PLT 101* 87*   Recent Labs    03/27/19 0515 03/28/19 0705  NA 142 145  K 4.0 3.9  CL 105 102  CO2 27 29  GLUCOSE 115* 106*  BUN 45* 43*  CREATININE 1.48* 1.26*  CALCIUM 8.9 9.6    Intake/Output Summary (Last 24 hours) at 03/28/2019 0857 Last data filed at 03/28/2019 0842 Gross per 24 hour  Intake 450 ml  Output 1400 ml  Net -950 ml     Physical Exam: Vital Signs Blood pressure (!) 157/65, pulse 60, temperature 98.3 F (36.8 C), temperature source Oral, resp. rate 15, height 5\' 1"  (1.549 m), weight 79.2 kg, SpO2 91 %. Constitutional: No distress . Vital signs reviewed. HEENT: EOMI, oral membranes moist Neck: supple Cardiovascular: brady without murmur. No JVD    Respiratory:  Crackles at bases. No distress  GI: BS +, non-tender, non-distended  Musculoskeletal: Bilateral lower extremity edema Neurological: She is alertand oriented x 3 Motor: ongoing tremor, especially with intentional movements Bilateral lower extremities grossly 4/5 proximal to distal Ext- 1+ bilateral pre tib edema, stasis dermatitis changes ---no changes Skin: Skin iswarmand dry. Psychiatric: pleasant  Assessment/Plan: 1. Functional deficits secondary to rhabdomyolysis, debility which require 3+ hours per day of interdisciplinary therapy in a comprehensive inpatient rehab setting.  Physiatrist is providing close team supervision and 24 hour management of active medical problems listed below.  Physiatrist and rehab team continue to  assess barriers to discharge/monitor patient progress toward functional and medical goals  Care Tool:  Bathing    Body parts bathed by patient: Right arm, Left arm, Chest, Abdomen, Face, Right upper leg, Left upper leg, Front perineal area, Right lower leg, Left lower leg, Buttocks   Body parts bathed by helper: Buttocks     Bathing assist Assist Level: Supervision/Verbal cueing     Upper Body Dressing/Undressing Upper body dressing   What is the patient wearing?: Button up shirt    Upper body assist Assist Level: Set up assist    Lower Body Dressing/Undressing Lower body dressing      What is the patient wearing?: Pants     Lower body assist Assist for lower body dressing: Supervision/Verbal cueing     Toileting Toileting    Toileting assist Assist for toileting: Supervision/Verbal cueing     Transfers Chair/bed transfer  Transfers assist  Chair/bed transfer activity did not occur: Safety/medical concerns  Chair/bed transfer assist level: Contact Guard/Touching assist     Locomotion Ambulation   Ambulation assist      Assist level: Contact Guard/Touching assist Assistive device: Walker-rolling Max distance: 40ft   Walk 10 feet activity   Assist     Assist level: Contact Guard/Touching assist Assistive device: Walker-rolling   Walk 50 feet activity   Assist Walk 50 feet with 2 turns activity did not occur: Safety/medical concerns(fatigue )  Assist level: Contact Guard/Touching assist Assistive device: Walker-rolling    Walk 150 feet activity   Assist Walk 150 feet activity did not occur:  Safety/medical concerns(fatigue )  Assist level: Contact Guard/Touching assist Assistive device: Walker-rolling    Walk 10 feet on uneven surface  activity   Assist Walk 10 feet on uneven surfaces activity did not occur: Safety/medical concerns         Wheelchair     Assist Will patient use wheelchair at discharge?: No Type of  Wheelchair: Manual      Max wheelchair distance: 150'    Wheelchair 50 feet with 2 turns activity    Assist        Assist Level: Supervision/Verbal cueing   Wheelchair 150 feet activity     Assist     Assist Level: Supervision/Verbal cueing    Medical Problem List and Plan: 1.Functional mobility deficitssecondary to rhabdomyolysis, pneumonia, ETOH abuse.  Cont CIR PT, OT    -holding discharge pending medical---target Friday 2. Antithrombotics: -DVT/anticoagulation:Pharmaceutical:Lovenox -antiplatelet therapy: ASA 3. Pain Management:tylenol prn. 4. Mood:team providing ego support  Neuropsych eval  -baseline anxiety present but seems to be managing fairly well at present -antipsychotic agents:  Seroquel and librium(used 10 mg bid for anxiety at home). 5. Neuropsych: This patientis?  Notfully capable of making decisions on herown behalf. 6. Skin/Wound Care:Routine pressure relief measures. 7. Fluids/Electrolytes/Nutrition:encourage PO, eating well as of late   9. AKI with mild rhabdomyolysis:    -   -BUN/Cr stable today 43/1.26  -lasix resumed per cards 40mg  daily then 20mg  daily thereafter    10.Chronic diastolic CHFwith exacerbation:  improved after diuresis   -recent chest x-ray showed small pleural effusions Filed Weights   03/26/19 0452 03/27/19 0500 03/28/19 0604  Weight: 77.8 kg 79 kg 79.2 kg    -weight stable from yesterday  -appreciate cards input   -lasix 40mg  daily then 20mg  daily  -cardizem stopped 11. H/o anxiety/depression: Lost husband 12/19.Managed by Dr. Vilma Meckel and librium. 12. Abnormal LFTs:normal 4/1 13. Fall risk: Continue high low bed.  14.  Constipation    -Improved  -continue probiotic  -sorbitol prn 15.  Essential hypertension/bradycardia  Hydralazine 50 3 times daily, increased to 75 3 times daily on 4/4   Lasix resumed  -cardizem stopped  Continue  Toprol 75 daily  -HR   in 60's today      Vitals:   03/28/19 0604 03/28/19 0625  BP: (!) 144/63 (!) 157/65  Pulse: 60   Resp: 15   Temp:    SpO2: 91%     16.  Acute blood loss anemia  Hemoglobin  9.3 4/14  Continue to monitor 17.  Thrombocytopenia  Platelets 101 4/13--->87k today 4/14  -have d/c'ed lovenox---recheck platelets thursday    LOS: 15 days A FACE TO FACE EVALUATION WAS PERFORMED  Meredith Staggers 03/28/2019, 8:57 AM

## 2019-03-28 NOTE — Progress Notes (Signed)
Physical Therapy Session Note  Patient Details  Name: [name] [name] MRN: [id number] Date of Birth: 14-Jan-1936  Today's Date: 03/28/2019 PT Individual Time: 9892-1194 PT Individual Time Calculation (min): 50 min   Short Term Goals: Week 2:  PT Short Term Goal 1 (Week 2): =LTG due to ELOS  Skilled Therapeutic Interventions/Progress Updates:    Patient received up in [facility], very sleepy this morning but pleasant and willing to participate in PT today. Transported her to day room totalA in [facility], then performed sit to stand/stand pivot transfer to [employer] with MinA and RW, Mod cues for safety this morning, patient states "I still feel like I'm waking up!". Tolerated riding Nustep with B UEs/LEs on resistance 4 for 10 minutes, then transferred back to [facility] with MinA and RW. Able to self-propel WC 144f with S and B UEs today. Continued gait training approximately 832fwith RW and min guard, stopped due to briefs falling down and returned to room to rearrange clothing. Required multiple stands with MinA at grab bar in bathroom and totalA to pull briefs up. She was left up in her WC with seat belt alarm active, all other needs met this morning.   Therapy Documentation Precautions:  Precautions Precautions: Fall Restrictions Weight Bearing Restrictions: No General:   Vital Signs:  Pain: Pain Assessment Pain Scale: 0-10 Pain Score: 0-No pain    Therapy/Group: Individual Therapy   KrDeniece ReeT, DPT, CBIS  Supplemental Physical Therapist [facility]  Pager [phone] Office [phone]  03/28/2019, 1:07 PM

## 2019-03-29 ENCOUNTER — Inpatient Hospital Stay (HOSPITAL_COMMUNITY): Payer: Self-pay | Admitting: Physical Therapy

## 2019-03-29 ENCOUNTER — Inpatient Hospital Stay (HOSPITAL_COMMUNITY): Payer: Self-pay | Admitting: Speech Pathology

## 2019-03-29 ENCOUNTER — Inpatient Hospital Stay
Admission: AD | Admit: 2019-03-29 | Payer: Medicare Other | Source: Other Acute Inpatient Hospital | Admitting: Internal Medicine

## 2019-03-29 ENCOUNTER — Inpatient Hospital Stay (HOSPITAL_COMMUNITY): Payer: Self-pay | Admitting: Occupational Therapy

## 2019-03-29 ENCOUNTER — Inpatient Hospital Stay (HOSPITAL_COMMUNITY): Payer: Medicare Other

## 2019-03-29 ENCOUNTER — Inpatient Hospital Stay (HOSPITAL_COMMUNITY): Payer: Self-pay

## 2019-03-29 DIAGNOSIS — A419 Sepsis, unspecified organism: Secondary | ICD-10-CM

## 2019-03-29 DIAGNOSIS — T68XXXA Hypothermia, initial encounter: Secondary | ICD-10-CM

## 2019-03-29 DIAGNOSIS — R001 Bradycardia, unspecified: Secondary | ICD-10-CM

## 2019-03-29 DIAGNOSIS — R400 Somnolence: Secondary | ICD-10-CM

## 2019-03-29 DIAGNOSIS — R4182 Altered mental status, unspecified: Secondary | ICD-10-CM

## 2019-03-29 LAB — CBC
HCT: 33.4 % — ABNORMAL LOW (ref 36.0–46.0)
Hemoglobin: 10.7 g/dL — ABNORMAL LOW (ref 12.0–15.0)
MCH: 31.7 pg (ref 26.0–34.0)
MCHC: 32 g/dL (ref 30.0–36.0)
MCV: 98.8 fL (ref 80.0–100.0)
Platelets: 90 10*3/uL — ABNORMAL LOW (ref 150–400)
RBC: 3.38 MIL/uL — ABNORMAL LOW (ref 3.87–5.11)
RDW: 13.1 % (ref 11.5–15.5)
WBC: 10.2 10*3/uL (ref 4.0–10.5)
nRBC: 0 % (ref 0.0–0.2)

## 2019-03-29 LAB — URINALYSIS, ROUTINE W REFLEX MICROSCOPIC
Bilirubin Urine: NEGATIVE
Glucose, UA: NEGATIVE mg/dL
Ketones, ur: NEGATIVE mg/dL
Leukocytes,Ua: NEGATIVE
Nitrite: POSITIVE — AB
Protein, ur: 30 mg/dL — AB
Specific Gravity, Urine: 1.01 (ref 1.005–1.030)
pH: 5 (ref 5.0–8.0)

## 2019-03-29 LAB — BASIC METABOLIC PANEL
Anion gap: 13 (ref 5–15)
BUN: 45 mg/dL — ABNORMAL HIGH (ref 8–23)
CO2: 29 mmol/L (ref 22–32)
Calcium: 9.7 mg/dL (ref 8.9–10.3)
Chloride: 101 mmol/L (ref 98–111)
Creatinine, Ser: 1.17 mg/dL — ABNORMAL HIGH (ref 0.44–1.00)
GFR calc Af Amer: 51 mL/min — ABNORMAL LOW (ref >60)
GFR calc non Af Amer: 44 mL/min — ABNORMAL LOW (ref >60)
Glucose, Bld: 121 mg/dL — ABNORMAL HIGH (ref 70–99)
Potassium: 4 mmol/L (ref 3.5–5.1)
Sodium: 143 mmol/L (ref 135–145)

## 2019-03-29 LAB — COMPREHENSIVE METABOLIC PANEL
ALT: 30 U/L (ref 0–44)
AST: 24 U/L (ref 15–41)
Albumin: 3.1 g/dL — ABNORMAL LOW (ref 3.5–5.0)
Alkaline Phosphatase: 95 U/L (ref 38–126)
Anion gap: 11 (ref 5–15)
BUN: 46 mg/dL — ABNORMAL HIGH (ref 8–23)
CO2: 30 mmol/L (ref 22–32)
Calcium: 9.5 mg/dL (ref 8.9–10.3)
Chloride: 103 mmol/L (ref 98–111)
Creatinine, Ser: 1.21 mg/dL — ABNORMAL HIGH (ref 0.44–1.00)
GFR calc Af Amer: 49 mL/min — ABNORMAL LOW (ref >60)
GFR calc non Af Amer: 42 mL/min — ABNORMAL LOW (ref >60)
Glucose, Bld: 78 mg/dL (ref 70–99)
Potassium: 4 mmol/L (ref 3.5–5.1)
Sodium: 144 mmol/L (ref 135–145)
Total Bilirubin: 0.9 mg/dL (ref 0.3–1.2)
Total Protein: 6.3 g/dL — ABNORMAL LOW (ref 6.5–8.1)

## 2019-03-29 LAB — CBC WITH DIFFERENTIAL/PLATELET
Abs Immature Granulocytes: 0.03 10*3/uL (ref 0.00–0.07)
Basophils Absolute: 0 10*3/uL (ref 0.0–0.1)
Basophils Relative: 0 %
Eosinophils Absolute: 0.1 10*3/uL (ref 0.0–0.5)
Eosinophils Relative: 1 %
HCT: 31.5 % — ABNORMAL LOW (ref 36.0–46.0)
Hemoglobin: 9.8 g/dL — ABNORMAL LOW (ref 12.0–15.0)
Immature Granulocytes: 0 %
Lymphocytes Relative: 9 %
Lymphs Abs: 0.7 10*3/uL (ref 0.7–4.0)
MCH: 30.4 pg (ref 26.0–34.0)
MCHC: 31.1 g/dL (ref 30.0–36.0)
MCV: 97.8 fL (ref 80.0–100.0)
Monocytes Absolute: 0.6 10*3/uL (ref 0.1–1.0)
Monocytes Relative: 8 %
Neutro Abs: 6.4 10*3/uL (ref 1.7–7.7)
Neutrophils Relative %: 82 %
Platelets: 92 10*3/uL — ABNORMAL LOW (ref 150–400)
RBC: 3.22 MIL/uL — ABNORMAL LOW (ref 3.87–5.11)
RDW: 12.9 % (ref 11.5–15.5)
WBC: 7.9 10*3/uL (ref 4.0–10.5)
nRBC: 0 % (ref 0.0–0.2)

## 2019-03-29 LAB — GLUCOSE, CAPILLARY: Glucose-Capillary: 97 mg/dL (ref 70–99)

## 2019-03-29 LAB — BLOOD GAS, ARTERIAL
Acid-Base Excess: 7.4 mmol/L — ABNORMAL HIGH (ref 0.0–2.0)
Bicarbonate: 32.3 mmol/L — ABNORMAL HIGH (ref 20.0–28.0)
Drawn by: 246101
O2 Content: 4 L/min
O2 Saturation: 98.1 %
Patient temperature: 93
pCO2 arterial: 45.6 mmHg (ref 32.0–48.0)
pH, Arterial: 7.447 (ref 7.350–7.450)
pO2, Arterial: 110 mmHg — ABNORMAL HIGH (ref 83.0–108.0)

## 2019-03-29 LAB — T4, FREE: Free T4: 1 ng/dL (ref 0.82–1.77)

## 2019-03-29 LAB — PROCALCITONIN: Procalcitonin: <0.1 ng/mL

## 2019-03-29 LAB — AMMONIA: Ammonia: 28 umol/L (ref 9–35)

## 2019-03-29 LAB — TSH: TSH: 5.339 u[IU]/mL — ABNORMAL HIGH (ref 0.350–4.500)

## 2019-03-29 MED ORDER — THYROID 30 MG PO TABS
30.0000 mg | ORAL_TABLET | Freq: Every day | ORAL | Status: DC
  Administered 2019-03-30: 30 mg via ORAL
  Filled 2019-03-29 (×2): qty 1

## 2019-03-29 MED ORDER — VANCOMYCIN HCL 10 G IV SOLR
1500.0000 mg | Freq: Once | INTRAVENOUS | Status: AC
  Administered 2019-03-29: 1500 mg via INTRAVENOUS
  Filled 2019-03-29 (×2): qty 1500

## 2019-03-29 MED ORDER — SODIUM CHLORIDE 0.9 % IV SOLN
2.0000 g | INTRAVENOUS | Status: DC
  Administered 2019-03-29 – 2019-03-31 (×3): 2 g via INTRAVENOUS
  Filled 2019-03-29 (×7): qty 2

## 2019-03-29 MED ORDER — VANCOMYCIN HCL 10 G IV SOLR: 1250.0000 mg | INTRAVENOUS | Status: DC

## 2019-03-29 NOTE — Progress Notes (Signed)
Pharmacy Antibiotic Note  [name] [name] is a 83 y.o. female transferred to CIR on 03/13/2019 after a fall.  Pt has new increase in WBC 5 > 10.2 and O2 requirement of 4L.  Pharmacy has been consulted to dose Vancomycin and Cefepime for sepsis.    SCr 1.17 (improving)  Plan: Vancomycin 1500 mg IV x 1 loading dose Vancomycin 1250 mg IV Q 48 hrs. Goal AUC 400-550. Expected AUC: 548 SCr used: 1.17  Cefepime 2gm IV q24h Monitor SCr, clinical progress, and culture data.   Height: 5\' 1"  (154.9 cm) Weight: 176 lb 13.7 oz (80.2 kg) IBW/kg (Calculated) : 47.8  No data recorded.  Recent Labs  Lab 03/26/19 0607 03/27/19 0515 03/28/19 0705 03/29/19 0628  WBC 6.5 5.4 5.0 10.2  CREATININE 1.50* 1.48* 1.26* 1.17*    Estimated Creatinine Clearance: 36.8 mL/min (A) (by C-G formula based on SCr of 1.17 mg/dL (H)).    Allergies  Allergen Reactions  . Tetanus Toxoids Swelling and Other (See Comments)    Arm (site) became swollen as a child  . Snake Antivenin [Antivenin Crotalidae Polyvalent] Other (See Comments)    Just can't take per daughter  . Yellow Dyes (Non-Tartrazine) Itching and Other (See Comments)    Makes patient nervous     Antimicrobials this admission: Vanc 4/16 >> Cefepime 4/16 >>  Dose adjustments this admission:   Microbiology results: 4/16 BCx: ordered  Thank you for allowing pharmacy to be a part of this patient's care.  [employer], Pharm.D., BCPS Clinical Pharmacist  **Pharmacist phone directory can now be found on [email or web address] ([email or web address]).  Listed under [place].  03/29/2019 5:22 PM

## 2019-03-29 NOTE — Plan of Care (Signed)
Modified goals. [name] [name], OTR/L

## 2019-03-29 NOTE — Progress Notes (Signed)
Pt has been very anxious and focused on sleeping medications since the beginning of the shift, frequently calling for meds despite being informed that it was too early to give out scheduled meds. Pt given HS meds at 8pm per request. Restless throughout the night, frequently calling out for assistance, however indecisive at times when staff enters room for assist. Confused and irritable at times when approached by staff. Prn ambien and trazodone given with minimal effects noted. Pt fell asleep approximately 0130, slept 4 hours this shift. Given tylenol for complaints of a headache this morning. Voiding without difficulty.

## 2019-03-29 NOTE — Progress Notes (Signed)
Dr Conni Elliot in to see patient. Pt is alert and responsive, had eaten her dinner tray. C/O HA remains with temp of 93.2 axillary. Labs and treatments ordered per MD. MD states if pt becomes lethargic or has change of condition to notify him to reevaluate for transfer to acute/progressive.

## 2019-03-29 NOTE — Progress Notes (Signed)
Physical Therapy Session Note  Patient Details  Name: [name] [name] MRN: [id number] Date of Birth: Apr 13, 1936  Today's Date: 03/29/2019 PT Individual Time: 1035-1105 PT Individual Time Calculation (min): 30 min   Short Term Goals:  Week 2:  PT Short Term Goal 1 (Week 2): =LTG due to ELOS  Skilled Therapeutic Interventions/Progress Updates:   Pt dozing in w/c.  She awakened to her name, and stated that she had a terrrible HA, rated 7/10. PT consulted with [place], RN,  who dispensed meds during session.  Seated Therapeutic exercise performed with LE to increase strength for functional mobility: 10 x1R/L long arc quad knee extension, marching;  2  X 10 ankle pumps, 1 x 10 R/L marching.  Attempted simulated car transfer; sit> stand x 2 attempts, but pt's tremors and apparent weakness limited her.  Pt unable to bring weight forward onto feet despite scooting forward well in w/c.  PT informed primary PT, Lovena Le.  Pt left pt resting in w/c with needs at hand, pillow behind back to decrease posterior pelvic tilt and increase comfort.  Seat belt alarm set.     Therapy Documentation Precautions:  Precautions Precautions: Fall Restrictions Weight Bearing Restrictions: No    Therapy/Group: Individual Therapy  Adlene Adduci 03/29/2019, 1:04 PM

## 2019-03-29 NOTE — Progress Notes (Signed)
Occupational Therapy Session Note  Patient Details  Name: [name] [name] MRN: [id number] Date of Birth: [birth year]  Today's Date: 03/29/2019 OT Individual Time: 1345-1430 OT Individual Time Calculation (min): 45 min  and Today's Date: 03/29/2019 OT Missed Time: 30 Minutes Missed Time Reason: Patient fatigue   Short Term Goals: Week 2:  OT Short Term Goal 1 (Week 2): STG=LTG due to LOS  Skilled Therapeutic Interventions/Progress Updates:    Therapist arrived at 8:30 for OT session. Pt in supine,compaints of headache and declining any activity or OOB transfer. Pt reports RN is aware and no need for intervention at this time. Will attempt to see pt again as able.   Therapist returned for scheduled session at 1:45. Complaints of cont headache but willing to participate as able. RN made aware of complaints. Pt received with hand off from NT following toileting task. Pt completed hand hygiene and oral care standing at sink with close supervision. She declined bathing this session opting to just change clothes. She used reacher to doff socks and thread underwear/pants. Prior to stand, BP 158/68 and therefore TED hose not donned. RN made aware of vitals. She stood at [facility] with min A and able to pull pants up with guarding assist.  Following seated rest break, she ambulated ~91ft back to bed with supervision.  Mod A to return to supine. Pt left in supine to rest with all needs in reach.     Therapy Documentation Precautions:  Precautions Precautions: Fall Restrictions Weight Bearing Restrictions: No   Therapy/Group: Individual Therapy  Assia Meanor L 03/29/2019, 7:03 AM

## 2019-03-29 NOTE — Significant Event (Signed)
Rapid Response Event Note RRT page  Overview:  pt lying supine in bed, lethargic, pulse faint, responds to verbal stimuli, follows commands, a/o x3, extremities cool, abd/chest warm to touch. During scans RN reports pt was back to her normal behavior.  SBP 125, HR 47-52, RR 12, 98% RA, rectal temp 92    Initial Focused Assessment:   Interventions: Head CT CXR ABG 7.44/45.6/110/32.3  20g IV RLW Applied warm blankets Plan of Care (if not transferred): Continue to monitor call RRT as needed.  Event Summary:   at      at          [facility], Sela Hua

## 2019-03-29 NOTE — Progress Notes (Addendum)
Speech Language Pathology Discharge Summary  Patient Details  Name: [name] [name] MRN: [id number] Date of Birth: 10-Jun-1936  Today's Date: 03/29/2019 SLP Individual Time: 0900-1000 SLP Individual Time Calculation (min): 60 min   Skilled Therapeutic Interventions:   Pt seen for skilled ST intervention targeting goals for improved cognition. Pt was in bed upon arrival of SLP. Pt was unable to verbalize steps for transferring out of bed into wheelchair, but was able to transfer with OT assistance. SLP facilitated session with thought organization task of creating a menu for her first home cooked meal. Min cues required to add to list, and provide specifics. Pt was then given grocery store advertisement to scan and locate her list items on sale. Mod+ cues required for attention to detail (buy 1, get 2 free) and to figure how much shrimp she would be taking home if she used the special. Mod cues required to find items from her list. Pt continues to exhibit flat affect, and is hard of hearing. Pt was left in wheelchair with all needs in reach and alarm on. Continue per current plan of care.  Patient has met (none) of 3 long term goals.  Patient to discharge at overall Mod level.  Reasons goals not met: pt is at moderate level of assistance for complex problem solving   Clinical Impression/Discharge Summary:    Pt failed to meet established LTG during rehab stay due to continued need for moderate assistance for complex problem solving, memory, and anticipatory awareness. Family education completed with pt and her daughter. 24 hour supervision is recommended for pt at home, given significant cognitive impairment and lack of awareness of deficits and their functional impact.   Care Partner:  Caregiver Able to Provide Assistance: Yes  Type of Caregiver Assistance: Physical;Cognitive  Recommendation:  24 hour supervision/assistance;Home Health SLP  Rationale for SLP Follow Up: Maximize cognitive  function and independence;Reduce caregiver burden   Equipment:    None  Reasons for discharge: Discharged from hospital   Patient/Family Agrees with Progress Made and Goals Achieved: Yes   [name], [facility], CCC-SLP Speech Language Pathologist  [name] 03/29/2019, 10:24 AM

## 2019-03-29 NOTE — Plan of Care (Signed)
  Problem: Consults Goal: RH GENERAL PATIENT EDUCATION Description See Patient Education module for education specifics. Outcome: Progressing   Problem: RH BOWEL ELIMINATION Goal: RH STG MANAGE BOWEL WITH ASSISTANCE Description STG Manage Bowel with Mod Assistance.  Outcome: Progressing   Problem: RH SKIN INTEGRITY Goal: RH STG SKIN FREE OF INFECTION/BREAKDOWN Description No new breakdown with min assist    Outcome: Progressing   Problem: RH SAFETY Goal: RH STG ADHERE TO SAFETY PRECAUTIONS W/ASSISTANCE/DEVICE Description STG Adhere to Safety Precautions With Supervision Assistance/Device.  Outcome: Progressing Goal: RH STG DECREASED RISK OF FALL WITH ASSISTANCE Description STG Decreased Risk of Fall With supervision Assistance. min  Outcome: Progressing   Problem: RH PAIN MANAGEMENT Goal: RH STG PAIN MANAGED AT OR BELOW PT'S PAIN GOAL Description < 3 out of 10.   Outcome: Progressing

## 2019-03-29 NOTE — Progress Notes (Signed)
[place] PHYSICAL MEDICINE & REHABILITATION PROGRESS NOTE   Subjective/Complaints:    A little anxious this morning. Has headache  ROS: Patient denies fever, rash, sore throat, blurred vision, nausea, vomiting, diarrhea, cough, shortness of breath or chest pain, joint or back pain,         Objective:   No results found. Recent Labs    03/28/19 0705 03/29/19 0628  WBC 5.0 10.2  HGB 10.5* 10.7*  HCT 32.7* 33.4*  PLT 85* 90*   Recent Labs    03/28/19 0705 03/29/19 0628  NA 145 143  K 3.9 4.0  CL 102 101  CO2 29 29  GLUCOSE 106* 121*  BUN 43* 45*  CREATININE 1.26* 1.17*  CALCIUM 9.6 9.7    Intake/Output Summary (Last 24 hours) at 03/29/2019 0907 Last data filed at 03/29/2019 0806 Gross per 24 hour  Intake 600 ml  Output 201 ml  Net 399 ml     Physical Exam: Vital Signs Blood pressure (!) 169/89, pulse (!) 57, temperature 98.3 F (36.8 C), temperature source Oral, resp. rate 16, height 5\' 1"  (1.549 m), weight 80.2 kg, SpO2 96 %. Constitutional: No distress . Vital signs reviewed. HEENT: EOMI, oral membranes moist Neck: supple Cardiovascular: brady without murmur. No JVD    Respiratory: crackles at bases    GI: BS +, non-tender, non-distended  Musculoskeletal: Bilateral lower extremity edema Neurological: She is alertand oriented x 3 Motor: ongoing tremor in all 4's Bilateral lower extremities grossly 4/5 proximal to distal Ext- 1+ bilateral pre tib edema, stasis dermatitis changes ---no changes Skin: Skin iswarmand dry. Psychiatric:sl anxious  Assessment/Plan: 1. Functional deficits secondary to rhabdomyolysis, debility which require 3+ hours per day of interdisciplinary therapy in a comprehensive inpatient rehab setting.  Physiatrist is providing close team supervision and 24 hour management of active medical problems listed below.  Physiatrist and rehab team continue to assess barriers to discharge/monitor patient progress toward functional and  medical goals  Care Tool:  Bathing    Body parts bathed by patient: Right arm, Left arm, Chest, Abdomen, Face, Right upper leg, Left upper leg, Front perineal area, Right lower leg, Left lower leg, Buttocks   Body parts bathed by helper: Buttocks     Bathing assist Assist Level: Supervision/Verbal cueing     Upper Body Dressing/Undressing Upper body dressing   What is the patient wearing?: Button up shirt    Upper body assist Assist Level: Set up assist    Lower Body Dressing/Undressing Lower body dressing      What is the patient wearing?: Pants     Lower body assist Assist for lower body dressing: Supervision/Verbal cueing     Toileting Toileting    Toileting assist Assist for toileting: Supervision/Verbal cueing     Transfers Chair/bed transfer  Transfers assist  Chair/bed transfer activity did not occur: Safety/medical concerns  Chair/bed transfer assist level: Minimal Assistance - Patient > 75%     Locomotion Ambulation   Ambulation assist      Assist level: Contact Guard/Touching assist Assistive device: Walker-rolling Max distance: 47ft    Walk 10 feet activity   Assist     Assist level: Contact Guard/Touching assist Assistive device: Walker-rolling   Walk 50 feet activity   Assist Walk 50 feet with 2 turns activity did not occur: Safety/medical concerns(fatigue )  Assist level: Contact Guard/Touching assist Assistive device: Walker-rolling    Walk 150 feet activity   Assist Walk 150 feet activity did not occur: Safety/medical concerns(fatigue )  Assist level: Contact Guard/Touching assist Assistive device: Walker-rolling    Walk 10 feet on uneven surface  activity   Assist Walk 10 feet on uneven surfaces activity did not occur: Safety/medical concerns         Wheelchair     Assist Will patient use wheelchair at discharge?: No Type of Wheelchair: Manual    Wheelchair assist level: Supervision/Verbal  cueing Max wheelchair distance: 166ft     Wheelchair 50 feet with 2 turns activity    Assist        Assist Level: Supervision/Verbal cueing   Wheelchair 150 feet activity     Assist     Assist Level: Supervision/Verbal cueing    Medical Problem List and Plan: 1.Functional mobility deficitssecondary to rhabdomyolysis, pneumonia, ETOH abuse.  Cont CIR PT, OT    -feel she'll be ready for discharge Friday---HH follow up including PT, OT and RN to check weights and BMET/CBC on Monday 04/02/2019  -Patient to see Rehab MD/provider in the office for transitional care encounter in 2-4 weeks.  2. Antithrombotics: -DVT/anticoagulation:Pharmaceutical:Lovenox -antiplatelet therapy: ASA 3. Pain Management:tylenol prn. 4. Mood:team providing ego support  Neuropsych eval  -baseline anxiety present but seems to be managing fairly well at present -antipsychotic agents:  Seroquel and librium(used 10 mg bid for anxiety at home). 5. Neuropsych: This patientis?  Notfully capable of making decisions on herown behalf. 6. Skin/Wound Care:Routine pressure relief measures. 7. Fluids/Electrolytes/Nutrition:encourage PO, eating well as of late   9. AKI with mild rhabdomyolysis:    -BUN/Cr stable today 45/1.17  -lasix resumed per cards 40mg  daily for 3 days from 4/14 then 20mg  daily starting tomorrow    10.Chronic diastolic CHFwith exacerbation:  improved after diuresis   -recent chest x-ray showed small pleural effusions Filed Weights   03/27/19 0500 03/28/19 0604 03/29/19 0609  Weight: 79 kg 79.2 kg 80.2 kg    -weight stable around 80kg  -appreciate cards input   -lasix 40mg  daily then 20mg  daily at discharge  -cardizem stopped 11. H/o anxiety/depression: Lost husband 12/19.Managed by Dr. Vilma Meckel and librium. 12. Abnormal LFTs:normal 4/1 13. Fall risk: Continue high low bed.  14.  Constipation     -Improved  -continue probiotic  -sorbitol prn 15.  Essential hypertension/bradycardia  Hydralazine 50 3 times daily, increased to 75 3 times daily on 4/4   Lasix resumed  -cardizem stopped  Continue Toprol 75 daily  -HR   Around 60 today      Vitals:   03/28/19 1932 03/29/19 0609  BP: (!) 131/58 (!) 169/89  Pulse: 60 (!) 57  Resp: 16 16  Temp:    SpO2: 98% 96%    16.  Acute blood loss anemia  Hemoglobin  9.3 4/14  Continue to monitor 17.  Thrombocytopenia  Platelets 101 4/13--->87k   4/14---> 90k 4/16  -have d/c'ed lovenox---recheck platelets tomorrow prior to discharge    LOS: 16 days [place] 03/29/2019, 9:07 AM

## 2019-03-29 NOTE — Consult Note (Signed)
Medical Consultation   [name] [name]  ITG:[id number]  DOB: [birth year]  DOA: 03/13/2019  PCP: [name], MD   Outpatient Specialists:   Requesting physician: Dr. Alger Simons  Reason for consultation: Lethargy and hypothermia   History of Present Illness: [name] [name] is an 83 y.o. female with history of previous strokes, coronary artery disease, paroxysmal atrial fibrillation not on anticoagulation secondary to fall, hypertension, heart failure with preserved ejection fraction, on multiple antianxiety medications, hypothyroidism, admitted to the hospitalist service on 03/09/2019 and was discharged to rehab team on 03/13/2019.  Apparently, prior to presentation to the hospitalist team, patient fell on the porch and was found laying on floor for a while. EMS found patient to be hypothermic. Patient was confused. Patient was worked up extensively by the hospitalist service, thought to have been likely septic, without any clear source of infection.   One of the blood cultures positive for gram-positive rods which was likely thought to be contaminant.  As per the rehab change PA, patient became lethargic and hypothermic earlier today demented temperature of 92 to 93.4 F, and heart rate of 44 to 60 bpm.  No hypertension.  Hospitalist service has been asked to assist with patient's management.  Patient is unable to give any significant history.  Review of Systems:  ROS As per HPI otherwise 10 point review of systems negative.    Past Medical History: Past Medical History:  Diagnosis Date   Alopecia    Anemia    hx of years ago    Anxiety    ARF (acute renal failure) ([place]) 08/23/2014   Arthritis    psoriatic arthritis   Back pain    Blood transfusion    at age of 4   Carotid stenosis 12/2016   Chronic diastolic CHF (congestive heart failure) ([employer])    Depression    Dysrhythmia 11/13/2016   PAFib for a short time- Echo done 11/14/16 PAF- to follow up  with PCP- to see if she is a candiate for anticoags.  Not started at times time due to alcohol abuse.   GERD (gastroesophageal reflux disease)    H/O hiatal hernia    History of acute cholangitis    History of GI diverticular bleed    Hyperlipidemia    Hypertension    Hypothyroidism    IBS (irritable bowel syndrome)    Pancreatitis    Peripheral vascular disease (HCC)    PONV (postoperative nausea and vomiting)    with Breast reduction- only time   Rosacea    Seizures ([place]) 11/13/2016   Thopught to be from withdrawl Benzodiazepine   Stroke [facility])    patient denies-   Thyroid disease     Past Surgical History: Past Surgical History:  Procedure Laterality Date   ABDOMINAL HYSTERECTOMY     partial   BACK SURGERY  2010   thoractic-screws and rods   BILE DUCT STENT PLACEMENT  08/26/2014   BREAST REDUCTION SURGERY     CHOLECYSTECTOMY  1988   lap   COLONOSCOPY     COLONOSCOPY Left 09/08/2014   Procedure: COLONOSCOPY;  Surgeon: Arta Silence, MD;  Location: [facility] ENDOSCOPY;  Service: Endoscopy;  Laterality: Left;   ENDARTERECTOMY Left 12/28/2016   ENDARTERECTOMY Left 12/28/2016   Procedure: ENDARTERECTOMY CAROTID LEFT WITH XENOSURE BIOLOGIC PATCH ANGIOPLASTY;  Surgeon: Angelia Mould, MD;  Location: [place];  Service: Vascular;  Laterality: Left;   ERCP  ERCP N/A 08/26/2014   Procedure: ENDOSCOPIC RETROGRADE CHOLANGIOPANCREATOGRAPHY (ERCP);  Surgeon: Jeryl Columbia, MD;  Location: [facility] ENDOSCOPY;  Service: Endoscopy;  Laterality: N/A;   ERCP N/A 09/11/2014   Procedure: ENDOSCOPIC RETROGRADE CHOLANGIOPANCREATOGRAPHY (ERCP);  Surgeon: Arta Silence, MD;  Location: [facility] ENDOSCOPY;  Service: Endoscopy;  Laterality: N/A;   EUS N/A 09/11/2014   Procedure: ESOPHAGEAL ENDOSCOPIC ULTRASOUND (EUS) RADIAL;  Surgeon: Arta Silence, MD;  Location: [facility] ENDOSCOPY;  Service: Endoscopy;  Laterality: N/A;   EYE SURGERY Bilateral    Cataract   JOINT REPLACEMENT  2005    left shoulder replacement    ORIF CLAVICULAR FRACTURE Left 07/18/2014   Procedure: OPEN REDUCTION INTERNAL FIXATION (ORIF) LEFT CLAVICULAR FRACTURE;  Surgeon: Ninetta Lights, MD;  Location: [place];  Service: Orthopedics;  Laterality: Left;   SPYGLASS CHOLANGIOSCOPY N/A 09/11/2014   Procedure: WGNFAOZH CHOLANGIOSCOPY;  Surgeon: Arta Silence, MD;  Location: [facility] ENDOSCOPY;  Service: Endoscopy;  Laterality: N/A;   TONSILLECTOMY  as child   TOTAL KNEE ARTHROPLASTY Left 03/10/2016   Procedure: LEFT TOTAL KNEE ARTHROPLASTY;  Surgeon: Ninetta Lights, MD;  Location: [place];  Service: Orthopedics;  Laterality: Left;   VENTRAL HERNIA REPAIR  06/08/2012   Procedure: LAPAROSCOPIC VENTRAL HERNIA;  Surgeon: Adin Hector, MD;  Location: [facility] ORS;  Service: General;  Laterality: Right;     Allergies:   Allergies  Allergen Reactions   Tetanus Toxoids Swelling and Other (See Comments)    Arm (site) became swollen as a child   Snake Antivenin [Antivenin Crotalidae Polyvalent] Other (See Comments)    Just can't take per daughter   Yellow Dyes (Non-Tartrazine) Itching and Other (See Comments)    Makes patient nervous      Social History:  reports that she has never smoked. She has never used smokeless tobacco. She reports current alcohol use of about 7.0 standard drinks of alcohol per week. She reports that she does not use drugs.   Family History: Family History  Problem Relation Age of Onset   Heart disease Mother    Cancer Father        lung   Bipolar disorder Brother      Physical Exam: Vitals:   03/29/19 1659 03/29/19 1834 03/29/19 1845 03/29/19 1951  BP: (!) 125/45 (!) 161/77  (!) 152/64  Pulse: (!) 44 (!) 55  (!) 55  Resp: 20 17  16   Temp:  (!) 92.2 F (33.4 C) (!) 92.2 F (33.4 C) (!) 93.2 F (34 C)  TempSrc:  Axillary Rectal   SpO2: 98% 94%  95%  Weight:      Height:        Constitutional: Appearance,  Alert and awake, not in any acute  distress. Eyes: Pallor.  No jaundice  Neck: Supple. CVS: S1-S2 clear Respiratory: Clear to auscultation anteriorly.   Abdomen:Obese, soft and nontender.  Organs are difficult to assess.   Neuro:  Awake and alert.  Patient moves all limbs  Data reviewed:  I have personally reviewed following labs and imaging studies Labs:  CBC: Recent Labs  Lab 03/26/19 0607 03/27/19 0515 03/28/19 0705 03/29/19 0628 03/29/19 1848  WBC 6.5 5.4 5.0 10.2 7.9  NEUTROABS  --   --   --   --  6.4  HGB 9.0* 9.3* 10.5* 10.7* 9.8*  HCT 29.5* 30.0* 32.7* 33.4* 31.5*  MCV 98.3 99.7 98.8 98.8 97.8  PLT 101* 87* 85* 90* 92*    Basic Metabolic Panel: Recent Labs  Lab  03/26/19 0607 03/27/19 0515 03/28/19 0705 03/29/19 0628 03/29/19 1848  NA 141 142 145 143 144  K 3.8 4.0 3.9 4.0 4.0  CL 103 105 102 101 103  CO2 27 27 29 29 30   GLUCOSE 104* 115* 106* 121* 78  BUN 45* 45* 43* 45* 46*  CREATININE 1.50* 1.48* 1.26* 1.17* 1.21*  CALCIUM 8.9 8.9 9.6 9.7 9.5   GFR Estimated Creatinine Clearance: 35.6 mL/min (A) (by C-G formula based on SCr of 1.21 mg/dL (H)). Liver Function Tests: Recent Labs  Lab 03/29/19 1848  AST 24  ALT 30  ALKPHOS 95  BILITOT 0.9  PROT 6.3*  ALBUMIN 3.1*   No results for input(s): LIPASE, AMYLASE in the last 168 hours. Recent Labs  Lab 03/29/19 1848  AMMONIA 28   Coagulation profile No results for input(s): INR, PROTIME in the last 168 hours.  Cardiac Enzymes: No results for input(s): CKTOTAL, CKMB, CKMBINDEX, TROPONINI in the last 168 hours. BNP: Invalid input(s): POCBNP CBG: Recent Labs  Lab 03/29/19 1639  GLUCAP 97   D-Dimer No results for input(s): DDIMER in the last 72 hours. Hgb A1c No results for input(s): HGBA1C in the last 72 hours. Lipid Profile No results for input(s): CHOL, HDL, LDLCALC, TRIG, CHOLHDL, LDLDIRECT in the last 72 hours. Thyroid function studies Recent Labs    03/29/19 1848  TSH 5.339*   Anemia work up No results for  input(s): VITAMINB12, FOLATE, FERRITIN, TIBC, IRON, RETICCTPCT in the last 72 hours. Urinalysis    Component Value Date/Time   COLORURINE YELLOW 03/29/2019 1800   APPEARANCEUR CLEAR 03/29/2019 1800   LABSPEC 1.010 03/29/2019 1800   PHURINE 5.0 03/29/2019 1800   GLUCOSEU NEGATIVE 03/29/2019 1800   HGBUR SMALL (A) 03/29/2019 1800   BILIRUBINUR NEGATIVE 03/29/2019 1800   KETONESUR NEGATIVE 03/29/2019 1800   PROTEINUR 30 (A) 03/29/2019 1800   UROBILINOGEN 0.2 09/04/2014 1340   NITRITE POSITIVE (A) 03/29/2019 1800   LEUKOCYTESUR NEGATIVE 03/29/2019 1800     Sepsis Labs Invalid input(s): PROCALCITONIN,  WBC,  LACTICIDVEN Microbiology No results found for this or any previous visit (from the past 240 hour(s)).     Inpatient Medications:   Scheduled Meds:  aspirin EC  81 mg Oral Daily   chlordiazePOXIDE  10 mg Oral TID   FLUoxetine  20 mg Oral Daily   fluticasone  1 spray Each Nare Daily   folic acid  1 mg Oral Daily   furosemide  20 mg Oral Daily   Gerhardt's butt cream   Topical TID   hydrALAZINE  75 mg Oral Q8H   ketotifen  1 drop Both Eyes BID   levothyroxine  75 mcg Oral QAC breakfast   metoprolol succinate  75 mg Oral Daily   pantoprazole  40 mg Oral Daily   QUEtiapine  25 mg Oral QHS   saccharomyces boulardii  250 mg Oral BID   senna  2 tablet Oral QHS   simvastatin  10 mg Oral QHS   thiamine  100 mg Oral Daily   [START ON 03/30/2019] thyroid  30 mg Oral QAC breakfast   venlafaxine XR  37.5 mg Oral Q breakfast   Continuous Infusions:  ceFEPime (MAXIPIME) IV     vancomycin     Followed by   Derrill Memo ON 03/31/2019] vancomycin       Radiological Exams on Admission: Dg Chest 2 View  Result Date: 03/29/2019 CLINICAL DATA:  Headache and unresponsive. Hypertension. CHF. Renal failure. EXAM: CHEST - 2 VIEW COMPARISON:  For 620 FINDINGS: Thoracic spine fixation. Left shoulder arthroplasty. Midline trachea. Mild cardiomegaly, accentuated by AP  portable frontal radiograph and extremely low lung volumes. Small left pleural effusion persists. No pneumothorax. Interstitial prominence is at least partially felt to be due to low lung volumes. Similar left base airspace disease. Mild right base atelectasis, improved. IMPRESSION: Minimal improvement in right base aeration period. Persistent left base airspace disease, suspicious for infection or aspiration. Similar small left pleural effusion. Electronically Signed   By: Abigail Miyamoto M.D.   On: 03/29/2019 18:18   Ct Head Wo Contrast  Result Date: 03/29/2019 CLINICAL DATA:  Headaches. Recent fall. EXAM: CT HEAD WITHOUT CONTRAST TECHNIQUE: Contiguous axial images were obtained from the base of the skull through the vertex without intravenous contrast. COMPARISON:  03/09/2019 FINDINGS: Brain: There is no evidence of acute infarct, intracranial hemorrhage, mass, midline shift, or extra-axial fluid collection. Mild cerebral atrophy is again noted. Cerebral white matter hypodensities are unchanged and nonspecific but compatible with mild chronic small vessel ischemic disease. Chronic lacunar infarcts are again seen in the left basal ganglia. Vascular: Calcified atherosclerosis at the skull base. No hyperdense vessel. Skull: No fracture or focal osseous lesion. Sinuses/Orbits: Small volume abnormal soft tissue inferiorly in the left orbit, extraconal in location and unchanged from the recent prior CT though smaller than on a 2018 MRI. Unchanged 5 mm nodular soft tissue focus between the left globe and medial rectus muscle. Bilateral cataract extraction. Visualized paranasal sinuses and mastoid air cells are clear. Other: None. IMPRESSION: 1. No evidence of acute intracranial abnormality. 2. Mild chronic small vessel ischemic disease. Electronically Signed   By: Logan Bores M.D.   On: 03/29/2019 18:02    Impression/Recommendations Hypothermia/lethargy/altered mentation:  Lethargy has resolved. We will  panculture the patient. Check cortisol level. Broad-spectrum antibiotics Warm patient (warming blanket) -Discussed transferring patient to progressive unit, however, rehab team feels comfortable managing patient for now. -Follow results of CT head. -Hospitalist team will follow patient with you. -Further management will depend on hospital course.  Please have low threshold to transfer patient to progressive unit or appropriate higher level of care if patient deteriorates.  Other Problems:   Hypertension   Thrombocytopenia (HCC)   Debility   Acute blood loss anemia   Chronic diastolic congestive heart failure (HCC)   Bradycardia   Somnolence  Thank you for this consultation.  Our [facility] hospitalist team will follow the patient with you.   Time Spent: 65 minutes  Dana Allan, MD  [facility] Pager #: [phone] 7PM-7AM contact night coverage as above

## 2019-03-29 NOTE — Progress Notes (Signed)
Pt was found to be lethargic and responding to only sound around 1630. Pt was last assessed around 1520. NT reported to writer that NT was not able to obtain pt's temp. Writer and NT went to assess pt temp around 1520 which was found to be 92.1 at 2 attempts and 92.0 at third attempt, rectal temperature. Pt was was lethargic but responding to voice. Rapid response was called. New orders were obtained from [facility], [place].     Daughter called and spoke to Probation officer. Pt's daughter states that she is concerned that pt is needing more assistance that daughter anticipated. Daughter states she will not be able to provide the amount of assistance the pt needs. Please address.

## 2019-03-30 ENCOUNTER — Inpatient Hospital Stay (HOSPITAL_COMMUNITY): Payer: Medicare Other

## 2019-03-30 DIAGNOSIS — R1312 Dysphagia, oropharyngeal phase: Secondary | ICD-10-CM

## 2019-03-30 LAB — CBC WITH DIFFERENTIAL/PLATELET
Abs Immature Granulocytes: 0.05 10*3/uL (ref 0.00–0.07)
Basophils Absolute: 0 10*3/uL (ref 0.0–0.1)
Basophils Relative: 0 %
Eosinophils Absolute: 0.1 10*3/uL (ref 0.0–0.5)
Eosinophils Relative: 1 %
HCT: 31.4 % — ABNORMAL LOW (ref 36.0–46.0)
Hemoglobin: 9.7 g/dL — ABNORMAL LOW (ref 12.0–15.0)
Immature Granulocytes: 1 %
Lymphocytes Relative: 8 %
Lymphs Abs: 0.6 10*3/uL — ABNORMAL LOW (ref 0.7–4.0)
MCH: 30.4 pg (ref 26.0–34.0)
MCHC: 30.9 g/dL (ref 30.0–36.0)
MCV: 98.4 fL (ref 80.0–100.0)
Monocytes Absolute: 0.6 10*3/uL (ref 0.1–1.0)
Monocytes Relative: 8 %
Neutro Abs: 5.9 10*3/uL (ref 1.7–7.7)
Neutrophils Relative %: 82 %
Platelets: 88 10*3/uL — ABNORMAL LOW (ref 150–400)
RBC: 3.19 MIL/uL — ABNORMAL LOW (ref 3.87–5.11)
RDW: 13.1 % (ref 11.5–15.5)
WBC: 7.3 10*3/uL (ref 4.0–10.5)
nRBC: 0.3 % — ABNORMAL HIGH (ref 0.0–0.2)

## 2019-03-30 LAB — BASIC METABOLIC PANEL
Anion gap: 13 (ref 5–15)
BUN: 47 mg/dL — ABNORMAL HIGH (ref 8–23)
CO2: 29 mmol/L (ref 22–32)
Calcium: 9 mg/dL (ref 8.9–10.3)
Chloride: 104 mmol/L (ref 98–111)
Creatinine, Ser: 1.39 mg/dL — ABNORMAL HIGH (ref 0.44–1.00)
GFR calc Af Amer: 41 mL/min — ABNORMAL LOW (ref >60)
GFR calc non Af Amer: 36 mL/min — ABNORMAL LOW (ref >60)
Glucose, Bld: 118 mg/dL — ABNORMAL HIGH (ref 70–99)
Potassium: 3.8 mmol/L (ref 3.5–5.1)
Sodium: 146 mmol/L — ABNORMAL HIGH (ref 135–145)

## 2019-03-30 LAB — T3 UPTAKE: T3 Uptake Ratio: 23 % — ABNORMAL LOW (ref 24–39)

## 2019-03-30 LAB — C-REACTIVE PROTEIN: CRP: 7.1 mg/dL — ABNORMAL HIGH (ref <1.0)

## 2019-03-30 LAB — CORTISOL-AM, BLOOD: Cortisol - AM: 9.2 ug/dL (ref 6.7–22.6)

## 2019-03-30 LAB — T3, FREE: T3, Free: 2.2 pg/mL (ref 2.0–4.4)

## 2019-03-30 LAB — PROCALCITONIN: Procalcitonin: <0.1 ng/mL

## 2019-03-30 MED ORDER — LEVOTHYROXINE SODIUM 100 MCG PO TABS
100.0000 ug | ORAL_TABLET | Freq: Every day | ORAL | Status: DC
  Administered 2019-03-31 – 2019-04-03 (×4): 100 ug via ORAL
  Filled 2019-03-30 (×4): qty 1

## 2019-03-30 MED ORDER — VANCOMYCIN HCL 1000 MG IV SOLR: 1000.0000 mg | INTRAVENOUS | Status: DC

## 2019-03-30 MED ORDER — CHLORDIAZEPOXIDE HCL 5 MG PO CAPS: 5.0000 mg | ORAL_CAPSULE | Freq: Two times a day (BID) | ORAL | Status: DC | PRN

## 2019-03-30 NOTE — Progress Notes (Signed)
Social Work Patient ID: [name] [name], female   DOB: [birth year], 83 y.o.   MRN: [id number]   Alerted by MD this morning that pt's d/c today is on hold due to medical issues.  MD has spoken with daughter. Will monitor and assist with d/c when medically cleared.  [name], LCSW

## 2019-03-30 NOTE — Progress Notes (Signed)
Patient has c/o a constant h/a since our arrival. Tylenol is ineffective. Reduced lights and put a warm wash cloth on her forehead for comfort. Patients oral temp has been 97.4 patient stated she was "hot". Patient is continent of bowel and bladder.

## 2019-03-30 NOTE — Progress Notes (Signed)
MRI reviewed. No acute changes. Old lacunar bg infarct. Low grade temp today. Suspect presentation related to uti/urosepsis. Continue to monitor and treat supportively.   Meredith Staggers, MD, [facility] 03/30/2019

## 2019-03-30 NOTE — Progress Notes (Signed)
Pt has continued to be lethargic throughout the afternoon. Pt is alert and oriented to place. Pt remains afebrile with temp of 98.0.  Pt has O2 sats in low 90's on 1L O2. Pt complains of steady headache.

## 2019-03-30 NOTE — Progress Notes (Signed)
Pharmacy Antibiotic Note  [name] [name] is a 83 y.o. female transferred to CIR on 03/13/2019 after a fall.  Pt has new increase in WBC 5 > 10.2 and O2 requirement of 4L.  Pharmacy has been consulted to dose Vancomycin and Cefepime for sepsis.    SCr back up to 1.39  Plan: Vancomycin 1000 mg IV Q 48 hrs. Cefepime 2gm IV q24h Monitor clinical picture, renal function, vanc levels prn F/U C&S, abx deescalation / LOT  Height: 5\' 1"  (154.9 cm) Weight: 176 lb 13.7 oz (80.2 kg) IBW/kg (Calculated) : 47.8  Temp (24hrs), Avg:94.8 F (34.9 C), Min:92 F (33.3 C), Max:100.4 F (38 C)  Recent Labs  Lab 03/27/19 0515 03/28/19 0705 03/29/19 0628 03/29/19 1848 03/30/19 0104  WBC 5.4 5.0 10.2 7.9 7.3  CREATININE 1.48* 1.26* 1.17* 1.21* 1.39*    Estimated Creatinine Clearance: 31 mL/min (A) (by C-G formula based on SCr of 1.39 mg/dL (H)).    Allergies  Allergen Reactions  . Tetanus Toxoids Swelling and Other (See Comments)    Arm (site) became swollen as a child  . Snake Antivenin [Antivenin Crotalidae Polyvalent] Other (See Comments)    Just can't take per daughter  . Yellow Dyes (Non-Tartrazine) Itching and Other (See Comments)    Makes patient nervous     Antimicrobials this admission: Vanc 4/16 >> Cefepime 4/16 >>  Dose adjustments this admission:   Microbiology results: 4/16 BCx: ordered  Thank you for allowing pharmacy to be a part of this patient's care.  Elenor Quinones, PharmD, BCPS, BCIDP Clinical Pharmacist 03/30/2019 12:01 PM

## 2019-03-30 NOTE — Progress Notes (Signed)
Pt is slow to respond but responds appropriately. Pt reports feeling "sleepy and thirsty"  Pt given water nectar thick via tsp. Pt's temp is 98.1 this am and bp is 130/52.

## 2019-03-30 NOTE — Progress Notes (Signed)
O2 saturations dropped to 84. RN put Pt on 1L of O2 [place] and O2 sats resting in low 90's. Pt appeared to have chills and was warm to the touch. Pt had been completely incontinent of urine and was unaware. Pt's temp is 100.8 oral. PA notified and RN was instructed to give 650 of tylenol. Pt alert and responsive at this time but complains of continued headache. Pt on continuous pulse ox and head of bed is at 45 degrees. Will continue to monitor closely.

## 2019-03-30 NOTE — Evaluation (Signed)
Speech Language Pathology Assessment and Plan  Patient Details  Name: [name] [name] MRN: [id number] Date of Birth: 1936-01-02  BEDSIDE SWALLOW EVALUATION  SLP Diagnosis: Dysphagia  Rehab Potential: Fair ELOS: pending medical condition    Today's Date: 03/30/2019 SLP Individual Time: 0715-0740 SLP Individual Time Calculation (min): 25 min   Problem List:  Patient Active Problem List   Diagnosis Date Noted  . Sepsis ([place]) 03/29/2019  . Bradycardia 03/29/2019  . Somnolence 03/29/2019  . Acute blood loss anemia   . Chronic diastolic congestive heart failure ([place])   . Debility 03/13/2019  . Hypothermia 03/09/2019  . Elevated liver enzymes 03/09/2019  . Thrombocytopenia ([place]) 03/09/2019  . Hyperkalemia 07/23/2017  . AKI (acute kidney injury) ([place]) 07/23/2017  . 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 Fall 06/26/2017  . Intractable nausea and vomiting 03/11/2017  . Acute lower UTI 03/11/2017  . Carotid stenosis 12/28/2016  . Sedative, hypnotic, or anxiolytic withdrawal delirium 11/15/2016  . Paroxysmal atrial fibrillation (H[facility])   . Cerebrovascular disease   . Fever   . Acute encephalopathy 11/10/2016  . Hypothyroidism 11/10/2016  . Hypertensive urgency 11/10/2016  . Altered mental state 11/10/2016  . Seizure ([place]) 11/10/2016  . Chronic diastolic CHF (congestive heart failure) ([place])   . Left carotid stenosis 10/29/2016  . S/P total knee replacement 03/10/2016  . 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 Ileus ([place]) 09/05/2014  . Leukocytosis 09/05/2014  . Vomiting 09/04/2014  . Pulmonary edema 09/04/2014  . Hypokalemia 08/28/2014  . Enteritis due to Clostridium difficile 08/27/2014  . 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 Protein calorie malnutrition ([place]) 08/24/2014  . Obstructive jaundice 08/23/2014  . Acute cholangitis 08/23/2014  . ARF (acute renal failure) ([place]) 08/23/2014  . Coagulopathy ([place]) 08/23/2014  . Back  pain   . Arthritis   . Hypertension   . GERD (gastroesophageal reflux disease)   . Anxiety   . IBS (irritable bowel syndrome)   . Hernia of Right posterior flank 04/18/2012   Past Medical History:  Past Medical History:  Diagnosis Date  . Alopecia   . Anemia    hx of years ago   . Anxiety   . ARF (acute renal failure) ([place]) 08/23/2014  . Arthritis    psoriatic arthritis  . Back pain   . Blood transfusion    at age of 4  . Carotid stenosis 12/2016  . Chronic diastolic CHF (congestive heart failure) ([place])   . Depression   . Dysrhythmia 11/13/2016   PAFib for a short time- Echo done 11/14/16 PAF- to follow up with PCP- to see if she is a candiate for anticoags.  Not started at times time due to alcohol abuse.  [name] GERD (gastroesophageal reflux disease)   . H/O hiatal hernia   . History of acute cholangitis   . History of GI diverticular bleed   . Hyperlipidemia   . Hypertension   . Hypothyroidism   . IBS (irritable bowel syndrome)   . Pancreatitis   . Peripheral vascular disease ([place])   . PONV (postoperative nausea and vomiting)    with Breast reduction- only time  . Rosacea   . Seizures ([place]) 11/13/2016   Thopught to be from withdrawl Benzodiazepine  . Stroke [facility])    patient denies-  . Thyroid disease    Past Surgical History:  Past Surgical History:  Procedure Laterality Date  . ABDOMINAL HYSTERECTOMY     partial  . BACK SURGERY  2010   thoractic-screws and rods  . BILE DUCT STENT PLACEMENT  08/26/2014  . BREAST REDUCTION SURGERY    . CHOLECYSTECTOMY  1988   lap  . COLONOSCOPY    . COLONOSCOPY Left 09/08/2014   Procedure: COLONOSCOPY;  Surgeon: Arta Silence, MD;  Location: [facility] ENDOSCOPY;  Service: Endoscopy;  Laterality: Left;  . ENDARTERECTOMY Left 12/28/2016  . ENDARTERECTOMY Left 12/28/2016   Procedure: ENDARTERECTOMY CAROTID LEFT WITH XENOSURE BIOLOGIC PATCH ANGIOPLASTY;  Surgeon: Angelia Mould, MD;  Location: [place];  Service: Vascular;  Laterality:  Left;  . ERCP    . ERCP N/A 08/26/2014   Procedure: ENDOSCOPIC RETROGRADE CHOLANGIOPANCREATOGRAPHY (ERCP);  Surgeon: Jeryl Columbia, MD;  Location[facility] ENDOSCOPY;  Service: Endoscopy;  Laterality: N/A;  . ERCP N/A 09/11/2014   Procedure: ENDOSCOPIC RETROGRADE CHOLANGIOPANCREATOGRAPHY (ERCP);  Surgeon: Arta Silence, MD;  Location: [facility] ENDOSCOPY;  Service: Endoscopy;  Laterality: N/A;  . EUS N/A 09/11/2014   Procedure: ESOPHAGEAL ENDOSCOPIC ULTRASOUND (EUS) RADIAL;  Surgeon: Arta Silence, MD;  Location: [facility] ENDOSCOPY;  Service: Endoscopy;  Laterality: N/A;  . EYE SURGERY Bilateral    Cataract  . JOINT REPLACEMENT  2005   left shoulder replacement   . ORIF CLAVICULAR FRACTURE Left 07/18/2014   Procedure: OPEN REDUCTION INTERNAL FIXATION (ORIF) LEFT CLAVICULAR FRACTURE;  Surgeon: Ninetta Lights, MD;  Location: [facility];  Service: Orthopedics;  Laterality: Left;  . SPYGLASS CHOLANGIOSCOPY N/A 09/11/2014   Procedure: SPYGLASS CHOLANGIOSCOPY;  Surgeon: Arta Silence, MD;  Location: [facility] ENDOSCOPY;  Service: Endoscopy;  Laterality: N/A;  . TONSILLECTOMY  as child  . TOTAL KNEE ARTHROPLASTY Left 03/10/2016   Procedure: LEFT TOTAL KNEE ARTHROPLASTY;  Surgeon: Ninetta Lights, MD;  Location: [facility];  Service: Orthopedics;  Laterality: Left;  [name] VENTRAL HERNIA REPAIR  06/08/2012   Procedure: LAPAROSCOPIC VENTRAL HERNIA;  Surgeon: Adin Hector, MD;  Location: [facility] ORS;  Service: General;  Laterality: Right;    Assessment / Plan / Recommendation Clinical Impression Pt is currently on inpatient rehab with previous plan to discharge on 03/30/19. Pt with medical decline on 4/16 with request for bedside swallow evaluation d/t concern for risk of aspiration and aspiration pneumonia. SLP provided BSE on 03/30/19. Pt able to arouse to name and participate in trials of ice chips, nectar thick liquids via spoon and trials of puree. Pt with immediate cough when consuming ice chips though swallow appeared swift.  Pt with ocnsisten cough on several boluses of ice chips which could indicate decresae airway protection. With trials of nectar thick liquids, via spoon, pt with no overt s/s of aspiration. Additionally, pt consumed puree with good oral clearing but fatigued after 3 boluses. Recommend puree diet with necatr thick liquids, medicine crushed in puree or via IV, full supervision and PO intake only when alert. Educaiton provided to pt's nurse and MD on findings and recommendation. ST will continue to follow for safety with PO intake.      SLP Assessment  Patient will need skilled Speech Lanaguage Pathology Services during CIR admission    Recommendations  SLP Diet Recommendations: Dysphagia 1 (Puree);Nectar Liquid Administration via: Spoon Medication Administration: Crushed with puree Supervision: Staff to assist with self feeding;Full supervision/cueing for compensatory strategies Compensations: Minimize environmental distractions;Slow rate;Small sips/bites Postural Changes and/or Swallow Maneuvers: Seated upright 90 degrees Oral Care Recommendations: Oral care BID Patient destination: [facility] (SNF) Follow up Recommendations: Skilled Nursing facility Equipment Recommended: To be determined    SLP Frequency 3 to 5 out  of 7 days   SLP Duration  SLP Intensity  SLP Treatment/Interventions pending medical condition  Minumum of 1-2 x/day, 30 to 90 minutes  Dysphagia/aspiration precaution training    Pain Pain Assessment Pain Scale: Faces(no attempt by pt to communicate)  Prior Functioning Cognitive/Linguistic Baseline: Baseline deficits Baseline deficit details: Pt was deemed at baseline at 4/17. Will assess further as pt's alertness level increases.   Lives With: Daughter Available Help at Discharge: [place]  Short Term Goals: Week 3: SLP Short Term Goal 1 (Week 3): Pt will consume current diet with minimal overt s/s of aspiration/dysphagia and Min A  cues for use of ocmpensatory swallow strategies.  SLP Short Term Goal 2 (Week 3): Pt will consume trials of thin liquids with minimal overt s/s of aspiration over 3 sessions prior to diet upgrade.  SLP Short Term Goal 3 (Week 3): Pt will cosnume soft solids with minimal overt s/s of aspiration over 3 sessions prior to diet upgrade.   Refer to Care Plan for Long Term Goals  Recommendations for other services: None   Discharge Criteria: Patient will be discharged from SLP if patient refuses treatment 3 consecutive times without medical reason, if treatment goals not met, if there is a change in medical status, if patient makes no progress towards goals or if patient is discharged from hospital.  The above assessment, treatment plan, treatment alternatives and goals were discussed and mutually agreed upon: No family available/patient unable  Freyja Govea 03/30/2019, 2:19 PM

## 2019-03-30 NOTE — Progress Notes (Addendum)
PROGRESS NOTE    [name] [name]  NID:[id number] DOB: May 01, 1936 DOA: 03/13/2019 PCP: [name], MD  Brief Narrative: 83 year old female with history of CAD, paroxysmal atrial fibrillation not on anticoagulation secondary to multiple falls, history of alcoholism, history of prior strokes, history of chronic DIASTOLIC CHF, anxiety, depression, polypharmacy, hypothyroidism was brought to the hospital after being found down on her porch she was noted to be hypothermic. -Work-up was unremarkable, there was a concern for possible aspiration type event which was felt to be mild and treated with antibiotics -Sequently discharged to CIR for rehabilitation on 3/31 -Had been working with PT, OT and SLP, yesterday she was noted to be more drowsy and hypothermic, TRH was consulted for evaluation  Assessment & Plan:   Metabolic encephalopathy/Hypothermia -slightly lethargic but arousable, temp has normalized -polypharmacy is worsening situation but likley not the only etiology -infectious workup unrevealing so far -I have stopped benadryl, compazine, cut down librium from 10mg  TID to 5mg  BID PRN only -febrile to 100.4 this am, source remains unclear, due to polypharmacy im concerned about aspiration, will repeat CXR -procalicitonin is normal -TSH is mildly elevated, will increase synthroid dose to 129mcg, Cortisol checked earlier this admit was WNL -UA is marginally abnormal with positive nitrite and rare bacteria but only 0-5WBCs, FU urine Cx -repeat labs in am, DC Vancomycin, continue Cefepime today, monitor clinically -check CRP  Thrombocytopenia -acute, concerning for possible early sepsis -follow  Chronic diastolic CHF -Hold Lasix today in the setting of above, mild hypernatremia noted  P. atrial fibrillation -Continue Toprol-XL -Diltiazem was held due to bradycardia, monitor  Hypertension -Continue Toprol, hydralazine  DVT prophylaxis: SCDs Code Status: Full Code Family  Communication: no family at bedside Disposition Plan: Keep in 4W, if worsens will need Tx to SDU, [place] will FU  Consultants:      Procedures:   Antimicrobials:    Subjective: -tired, but more lucid per RN, temp has normalized  Objective: Vitals:   03/30/19 0539 03/30/19 0613 03/30/19 0800 03/30/19 1058  BP: (!) 140/49 (!) 141/61 (!) 130/52 (!) 128/56  Pulse: 64  72 67  Resp: 14   16  Temp: 98.1 F (36.7 C)  98.1 F (36.7 C) (!) 100.4 F (38 C)  TempSrc: Oral   Oral  SpO2: 94%  91% 95%  Weight:      Height:        Intake/Output Summary (Last 24 hours) at 03/30/2019 1339 Last data filed at 03/30/2019 1128 Gross per 24 hour  Intake 120 ml  Output -  Net 120 ml   Filed Weights   03/27/19 0500 03/28/19 0604 03/29/19 0609  Weight: 79 kg 79.2 kg 80.2 kg    Examination:  General exam: Somnolent, arousable, answers all questions, oriented to self and place only Respiratory system: decreased BS at bases Cardiovascular system: S1 & S2 heard, RRR.  Gastrointestinal system: Abdomen is nondistended, soft and nontender.Normal bowel sounds heard. Central nervous system: Alert and oriented. No focal neurological deficits. Extremities: no edema Skin: No rashes, lesions or ulcers Psychiatry: flat affect     Data Reviewed:   CBC: Recent Labs  Lab 03/27/19 0515 03/28/19 0705 03/29/19 0628 03/29/19 1848 03/30/19 0104  WBC 5.4 5.0 10.2 7.9 7.3  NEUTROABS  --   --   --  6.4 5.9  HGB 9.3* 10.5* 10.7* 9.8* 9.7*  HCT 30.0* 32.7* 33.4* 31.5* 31.4*  MCV 99.7 98.8 98.8 97.8 98.4  PLT 87* 85* 90* 92* 88*   Basic Metabolic  Panel: Recent Labs  Lab 03/27/19 0515 03/28/19 0705 03/29/19 0628 03/29/19 1848 03/30/19 0104  NA 142 145 143 144 146*  K 4.0 3.9 4.0 4.0 3.8  CL 105 102 101 103 104  CO2 27 29 29 30 29   GLUCOSE 115* 106* 121* 78 118*  BUN 45* 43* 45* 46* 47*  CREATININE 1.48* 1.26* 1.17* 1.21* 1.39*  CALCIUM 8.9 9.6 9.7 9.5 9.0   GFR: Estimated Creatinine  Clearance: 31 mL/min (A) (by C-G formula based on SCr of 1.39 mg/dL (H)). Liver Function Tests: Recent Labs  Lab 03/29/19 1848  AST 24  ALT 30  ALKPHOS 95  BILITOT 0.9  PROT 6.3*  ALBUMIN 3.1*   No results for input(s): LIPASE, AMYLASE in the last 168 hours. Recent Labs  Lab 03/29/19 1848  AMMONIA 28   Coagulation Profile: No results for input(s): INR, PROTIME in the last 168 hours. Cardiac Enzymes: No results for input(s): CKTOTAL, CKMB, CKMBINDEX, TROPONINI in the last 168 hours. BNP (last 3 results) No results for input(s): PROBNP in the last 8760 hours. HbA1C: No results for input(s): HGBA1C in the last 72 hours. CBG: Recent Labs  Lab 03/29/19 1639  GLUCAP 97   Lipid Profile: No results for input(s): CHOL, HDL, LDLCALC, TRIG, CHOLHDL, LDLDIRECT in the last 72 hours. Thyroid Function Tests: Recent Labs    03/29/19 1848  TSH 5.339*  FREET4 1.00  T3FREE 2.2   Anemia Panel: No results for input(s): VITAMINB12, FOLATE, FERRITIN, TIBC, IRON, RETICCTPCT in the last 72 hours. Urine analysis:    Component Value Date/Time   COLORURINE YELLOW 03/29/2019 1800   APPEARANCEUR CLEAR 03/29/2019 1800   LABSPEC 1.010 03/29/2019 1800   PHURINE 5.0 03/29/2019 1800   GLUCOSEU NEGATIVE 03/29/2019 1800   HGBUR SMALL (A) 03/29/2019 1800   BILIRUBINUR NEGATIVE 03/29/2019 1800   KETONESUR NEGATIVE 03/29/2019 1800   PROTEINUR 30 (A) 03/29/2019 1800   UROBILINOGEN 0.2 09/04/2014 1340   NITRITE POSITIVE (A) 03/29/2019 1800   LEUKOCYTESUR NEGATIVE 03/29/2019 1800   Sepsis Labs: @LABRCNTIP (procalcitonin:4,lacticidven:4)  ) Recent Results (from the past 240 hour(s))  Culture, Urine     Status: None (Preliminary result)   Collection Time: 03/29/19  5:40 PM  Result Value Ref Range Status   Specimen Description URINE, CATHETERIZED  Final   Special Requests NONE  Final   Culture   Final    CULTURE REINCUBATED FOR BETTER GROWTH Performed at [facility], [place] [address] [place], [place] [address]    Report Status PENDING  Incomplete  Culture, blood (routine x 2)     Status: None (Preliminary result)   Collection Time: 03/29/19  6:50 PM  Result Value Ref Range Status   Specimen Description BLOOD LEFT ARM  Final   Special Requests   Final    BOTTLES DRAWN AEROBIC ONLY Blood Culture adequate volume   Culture   Final    NO GROWTH < 12 HOURS Performed at [facility], [address] [place],  [address]    Report Status PENDING  Incomplete  Culture, blood (routine x 2)     Status: None (Preliminary result)   Collection Time: 03/29/19  7:03 PM  Result Value Ref Range Status   Specimen Description BLOOD RIGHT ARM  Final   Special Requests   Final    BOTTLES DRAWN AEROBIC ONLY Blood Culture adequate volume   Culture   Final    NO GROWTH < 12 HOURS Performed at [facility],  [address], [place], [place] [address]    Report Status PENDING  Incomplete         Radiology Studies: Dg Chest 2 View  Result Date: 03/29/2019 CLINICAL DATA:  Headache and unresponsive. Hypertension. CHF. Renal failure. EXAM: CHEST - 2 VIEW COMPARISON:  For 620 FINDINGS: Thoracic spine fixation. Left shoulder arthroplasty. Midline trachea. Mild cardiomegaly, accentuated by AP portable frontal radiograph and extremely low lung volumes. Small left pleural effusion persists. No pneumothorax. Interstitial prominence is at least partially felt to be due to low lung volumes. Similar left base airspace disease. Mild right base atelectasis, improved. IMPRESSION: Minimal improvement in right base aeration period. Persistent left base airspace disease, suspicious for infection or aspiration. Similar small left pleural effusion. Electronically Signed   By: Abigail Miyamoto M.D.   On: 03/29/2019 18:18   Ct Head Wo Contrast  Result Date: 03/29/2019 CLINICAL DATA:  Headaches. Recent fall. EXAM: CT HEAD WITHOUT CONTRAST TECHNIQUE: Contiguous axial images were obtained from the  base of the skull through the vertex without intravenous contrast. COMPARISON:  03/09/2019 FINDINGS: Brain: There is no evidence of acute infarct, intracranial hemorrhage, mass, midline shift, or extra-axial fluid collection. Mild cerebral atrophy is again noted. Cerebral white matter hypodensities are unchanged and nonspecific but compatible with mild chronic small vessel ischemic disease. Chronic lacunar infarcts are again seen in the left basal ganglia. Vascular: Calcified atherosclerosis at the skull base. No hyperdense vessel. Skull: No fracture or focal osseous lesion. Sinuses/Orbits: Small volume abnormal soft tissue inferiorly in the left orbit, extraconal in location and unchanged from the recent prior CT though smaller than on a 2018 MRI. Unchanged 5 mm nodular soft tissue focus between the left globe and medial rectus muscle. Bilateral cataract extraction. Visualized paranasal sinuses and mastoid air cells are clear. Other: None. IMPRESSION: 1. No evidence of acute intracranial abnormality. 2. Mild chronic small vessel ischemic disease. Electronically Signed   By: Logan Bores M.D.   On: 03/29/2019 18:02        Scheduled Meds: . aspirin EC  81 mg Oral Daily  . FLUoxetine  20 mg Oral Daily  . fluticasone  1 spray Each Nare Daily  . folic acid  1 mg Oral Daily  . Gerhardt's butt cream   Topical TID  . hydrALAZINE  75 mg Oral Q8H  . ketotifen  1 drop Both Eyes BID  . levothyroxine  75 mcg Oral QAC breakfast  . metoprolol succinate  75 mg Oral Daily  . pantoprazole  40 mg Oral Daily  . QUEtiapine  25 mg Oral QHS  . saccharomyces boulardii  250 mg Oral BID  . senna  2 tablet Oral QHS  . simvastatin  10 mg Oral QHS  . thiamine  100 mg Oral Daily  . thyroid  30 mg Oral QAC breakfast  . venlafaxine XR  37.5 mg Oral Q breakfast   Continuous Infusions: . ceFEPime (MAXIPIME) IV 2 g (03/29/19 2229)  . [START ON 03/31/2019] vancomycin       LOS: 17 days    Time spent: 33min     Domenic Polite, MD [facility]  03/30/2019, 1:39 PM

## 2019-03-30 NOTE — Progress Notes (Signed)
[facility] PHYSICAL MEDICINE & REHABILITATION PROGRESS NOTE   Subjective/Complaints:  Pt still with headache. Denies breathing problems this morning. Slow to respond  ROS: Limited due to cognitive/behavioral   Objective:   Dg Chest 2 View  Result Date: 03/29/2019 CLINICAL DATA:  Headache and unresponsive. Hypertension. CHF. Renal failure. EXAM: CHEST - 2 VIEW COMPARISON:  For 620 FINDINGS: Thoracic spine fixation. Left shoulder arthroplasty. Midline trachea. Mild cardiomegaly, accentuated by AP portable frontal radiograph and extremely low lung volumes. Small left pleural effusion persists. No pneumothorax. Interstitial prominence is at least partially felt to be due to low lung volumes. Similar left base airspace disease. Mild right base atelectasis, improved. IMPRESSION: Minimal improvement in right base aeration period. Persistent left base airspace disease, suspicious for infection or aspiration. Similar small left pleural effusion. Electronically Signed   By: Abigail Miyamoto M.D.   On: 03/29/2019 18:18   Ct Head Wo Contrast  Result Date: 03/29/2019 CLINICAL DATA:  Headaches. Recent fall. EXAM: CT HEAD WITHOUT CONTRAST TECHNIQUE: Contiguous axial images were obtained from the base of the skull through the vertex without intravenous contrast. COMPARISON:  03/09/2019 FINDINGS: Brain: There is no evidence of acute infarct, intracranial hemorrhage, mass, midline shift, or extra-axial fluid collection. Mild cerebral atrophy is again noted. Cerebral white matter hypodensities are unchanged and nonspecific but compatible with mild chronic small vessel ischemic disease. Chronic lacunar infarcts are again seen in the left basal ganglia. Vascular: Calcified atherosclerosis at the skull base. No hyperdense vessel. Skull: No fracture or focal osseous lesion. Sinuses/Orbits: Small volume abnormal soft tissue inferiorly in the left orbit, extraconal in location and unchanged from the recent prior CT though  smaller than on a 2018 MRI. Unchanged 5 mm nodular soft tissue focus between the left globe and medial rectus muscle. Bilateral cataract extraction. Visualized paranasal sinuses and mastoid air cells are clear. Other: None. IMPRESSION: 1. No evidence of acute intracranial abnormality. 2. Mild chronic small vessel ischemic disease. Electronically Signed   By: Logan Bores M.D.   On: 03/29/2019 18:02   Recent Labs    03/29/19 1848 03/30/19 0104  WBC 7.9 7.3  HGB 9.8* 9.7*  HCT 31.5* 31.4*  PLT 92* 88*   Recent Labs    03/29/19 1848 03/30/19 0104  NA 144 146*  K 4.0 3.8  CL 103 104  CO2 30 29  GLUCOSE 78 118*  BUN 46* 47*  CREATININE 1.21* 1.39*  CALCIUM 9.5 9.0    Intake/Output Summary (Last 24 hours) at 03/30/2019 0831 Last data filed at 03/29/2019 1300 Gross per 24 hour  Intake 120 ml  Output -  Net 120 ml     Physical Exam: Vital Signs Blood pressure (!) 141/61, pulse 64, temperature 98.1 F (36.7 C), temperature source Oral, resp. rate 14, height 5\' 1"  (1.549 m), weight 80.2 kg, SpO2 94 %. Constitutional: No distress . Vital signs reviewed. HEENT: EOMI, oral membranes moist Neck: supple Cardiovascular: RRR without murmur. No JVD    Respiratory: crackles at bases.    GI: BS +, non-tender, non-distended  Musculoskeletal: Bilateral lower extremity edema tr to 1 Neurological: oriented to self. Follows simple commands. Speech very slurred, low volume. Significant delays in processing Motor: ongoing tremor in all 4's Bilateral lower extremities grossly 4/5 proximal to distal Ext- 1+ bilateral pre tib edema, stasis dermatitis changes ---no changes Skin: Skin iswarmand dry. Psychiatric:slowed  Assessment/Plan: 1. Functional deficits secondary to rhabdomyolysis, debility which require 3+ hours per day of interdisciplinary therapy in a comprehensive inpatient  rehab setting.  Physiatrist is providing close team supervision and 24 hour management of active medical  problems listed below.  Physiatrist and rehab team continue to assess barriers to discharge/monitor patient progress toward functional and medical goals  Care Tool:  Bathing    Body parts bathed by patient: Right arm, Left arm, Chest, Abdomen, Face, Right upper leg, Left upper leg, Front perineal area, Right lower leg, Left lower leg, Buttocks   Body parts bathed by helper: Buttocks     Bathing assist Assist Level: Contact Guard/Touching assist     Upper Body Dressing/Undressing Upper body dressing   What is the patient wearing?: Button up shirt    Upper body assist Assist Level: Moderate Assistance - Patient 50 - 74%    Lower Body Dressing/Undressing Lower body dressing      What is the patient wearing?: Pants, Underwear/pull up     Lower body assist Assist for lower body dressing: Contact Guard/Touching assist     Toileting Toileting    Toileting assist Assist for toileting: Contact Guard/Touching assist     Transfers Chair/bed transfer  Transfers assist  Chair/bed transfer activity did not occur: Safety/medical concerns  Chair/bed transfer assist level: Supervision/Verbal cueing     Locomotion Ambulation   Ambulation assist      Assist level: Contact Guard/Touching assist Assistive device: Walker-rolling Max distance: 50'   Walk 10 feet activity   Assist     Assist level: Contact Guard/Touching assist Assistive device: Walker-rolling   Walk 50 feet activity   Assist Walk 50 feet with 2 turns activity did not occur: Safety/medical concerns(fatigue )  Assist level: Contact Guard/Touching assist Assistive device: Walker-rolling    Walk 150 feet activity   Assist Walk 150 feet activity did not occur: Safety/medical concerns  Assist level: Contact Guard/Touching assist Assistive device: Walker-rolling    Walk 10 feet on uneven surface  activity   Assist Walk 10 feet on uneven surfaces activity did not occur: Safety/medical  concerns         Wheelchair     Assist Will patient use wheelchair at discharge?: No Type of Wheelchair: Manual    Wheelchair assist level: Supervision/Verbal cueing Max wheelchair distance: 134ft     Wheelchair 50 feet with 2 turns activity    Assist        Assist Level: Supervision/Verbal cueing   Wheelchair 150 feet activity     Assist     Assist Level: Supervision/Verbal cueing    Medical Problem List and Plan: 1.Functional mobility deficitssecondary to rhabdomyolysis, pneumonia, ETOH abuse.    Cont CIR PT, OT, SLP as tolerated   -discharge on hold given change in clinical status.  2. Antithrombotics: -DVT/anticoagulation:Pharmaceutical:Lovenox -antiplatelet therapy: ASA 3. Pain Management:tylenol prn. 4. Mood:team providing ego support  Neuropsych eval  -baseline anxiety present but seems to be managing fairly well at present -antipsychotic agents:  Seroquel and librium(used 10 mg bid for anxiety at home). 5. Neuropsych: This patientis?  Notfully capable of making decisions on herown behalf. 6. Skin/Wound Care:Routine pressure relief measures. 7. Fluids/Electrolytes/Nutrition:encourage PO, eating well as of late   9. AKI with mild rhabdomyolysis:    -BUN/Cr stable today 45/1.17  -lasix resumed per cards 40mg  daily for 3 days from 4/14 then 20mg  daily starting tomorrow    10.Chronic diastolic CHFwith exacerbation:  improved after diuresis   -recent chest x-ray showed small pleural effusions Filed Weights   03/27/19 0500 03/28/19 0604 03/29/19 0609  Weight: 79 kg 79.2 kg 80.2 kg    -  weight stable around 80kg  -appreciate cards input   -lasix 40mg  daily then 20mg  daily at discharge  -cardizem stopped 11. H/o anxiety/depression: Lost husband 12/19.Managed by Dr. Vilma Meckel and librium. 12. Abnormal LFTs:normal 4/1 13. Fall risk: Continue high low bed.  14.  Constipation     -Improved  -continue probiotic  -sorbitol prn 15.  Essential hypertension/bradycardia  Hydralazine 50 3 times daily, increased to 75 3 times daily on 4/4   Lasix resumed  -cardizem stopped  Continue Toprol 75 daily  -HR   Around 60 today      Vitals:   03/30/19 0539 03/30/19 0613  BP: (!) 140/49 (!) 141/61  Pulse: 64   Resp: 14   Temp: 98.1 F (36.7 C)   SpO2: 94%     16.  Acute blood loss anemia  Hemoglobin  9.8 4/17  Continue to monitor 17.  Thrombocytopenia  Platelets 88k 4/17--no changes today  -have d/c'ed lovenox  18. Altered mental status  -extensive work up initiated yesterday afternoon.  -labwork and imaging all reviewed personally.   -UA equivocal, CXR actually looks better than image from last week  -vanc and maxipime initiated by [facility], appreciate input  -pt with slurred speech, word finding deficits, delays in processing and dysphagia on bedside evaluation with SLP   -CT negative, will order MRI as I have suspicion of stroke  -continue supportive care  LOS: 17 days A FACE TO FACE EVALUATION WAS PERFORMED  Meredith Staggers 03/30/2019, 8:30 AM

## 2019-03-31 ENCOUNTER — Inpatient Hospital Stay (HOSPITAL_COMMUNITY): Payer: Self-pay | Admitting: Speech Pathology

## 2019-03-31 ENCOUNTER — Inpatient Hospital Stay (HOSPITAL_COMMUNITY): Payer: Self-pay | Admitting: Physical Therapy

## 2019-03-31 ENCOUNTER — Inpatient Hospital Stay (HOSPITAL_COMMUNITY): Payer: Self-pay | Admitting: Occupational Therapy

## 2019-03-31 LAB — CBC
HCT: 32.4 % — ABNORMAL LOW (ref 36.0–46.0)
Hemoglobin: 9.9 g/dL — ABNORMAL LOW (ref 12.0–15.0)
MCH: 31.2 pg (ref 26.0–34.0)
MCHC: 30.6 g/dL (ref 30.0–36.0)
MCV: 102.2 fL — ABNORMAL HIGH (ref 80.0–100.0)
Platelets: 91 10*3/uL — ABNORMAL LOW (ref 150–400)
RBC: 3.17 MIL/uL — ABNORMAL LOW (ref 3.87–5.11)
RDW: 13.2 % (ref 11.5–15.5)
WBC: 5.9 10*3/uL (ref 4.0–10.5)
nRBC: 0 % (ref 0.0–0.2)

## 2019-03-31 LAB — COMPREHENSIVE METABOLIC PANEL
ALT: 23 U/L (ref 0–44)
AST: 16 U/L (ref 15–41)
Albumin: 2.9 g/dL — ABNORMAL LOW (ref 3.5–5.0)
Alkaline Phosphatase: 101 U/L (ref 38–126)
Anion gap: 14 (ref 5–15)
BUN: 32 mg/dL — ABNORMAL HIGH (ref 8–23)
CO2: 27 mmol/L (ref 22–32)
Calcium: 9.7 mg/dL (ref 8.9–10.3)
Chloride: 106 mmol/L (ref 98–111)
Creatinine, Ser: 1.24 mg/dL — ABNORMAL HIGH (ref 0.44–1.00)
GFR calc Af Amer: 48 mL/min — ABNORMAL LOW (ref >60)
GFR calc non Af Amer: 41 mL/min — ABNORMAL LOW (ref >60)
Glucose, Bld: 105 mg/dL — ABNORMAL HIGH (ref 70–99)
Potassium: 3.7 mmol/L (ref 3.5–5.1)
Sodium: 147 mmol/L — ABNORMAL HIGH (ref 135–145)
Total Bilirubin: 1.1 mg/dL (ref 0.3–1.2)
Total Protein: 6.3 g/dL — ABNORMAL LOW (ref 6.5–8.1)

## 2019-03-31 LAB — PROCALCITONIN: Procalcitonin: <0.1 ng/mL

## 2019-03-31 LAB — C-REACTIVE PROTEIN: CRP: 6.5 mg/dL — ABNORMAL HIGH (ref <1.0)

## 2019-03-31 MED ORDER — RESOURCE THICKENUP CLEAR PO POWD
ORAL | Status: DC | PRN
  Filled 2019-03-31: qty 125

## 2019-03-31 MED ORDER — FUROSEMIDE 20 MG PO TABS
20.0000 mg | ORAL_TABLET | Freq: Every day | ORAL | Status: DC
  Administered 2019-04-01 – 2019-04-03 (×3): 20 mg via ORAL
  Filled 2019-03-31 (×3): qty 1

## 2019-03-31 NOTE — Progress Notes (Signed)
PROGRESS NOTE    LAQUINTA HAZELL  JYN:[id number] DOB: Apr 23, 1936 DOA: 03/13/2019 PCP: [name], MD  Brief Narrative: 83 year old female with history of CAD, paroxysmal atrial fibrillation not on anticoagulation secondary to multiple falls, history of alcoholism, history of prior strokes, history of chronic DIASTOLIC CHF, anxiety, depression, polypharmacy, hypothyroidism was brought to the hospital after being found down on her porch she was noted to be hypothermic. -Work-up was unremarkable, there was a concern for possible aspiration type event which was felt to be mild and treated with antibiotics -Sequently discharged to CIR for rehabilitation on 3/31 -Had been working with PT, OT and SLP, yesterday she was noted to be more drowsy and hypothermic, TRH was consulted for evaluation  Assessment & Plan:   Metabolic encephalopathy/Hypothermia -likely due to urinary tract infection -Mental status improving, temperature has normalized -Blood cultures remain negative, urine culture grew E. coli and Klebsiella, sensitivities are pending -Also cut down sedating meds, decrease Librium dose from 10 mg 3 times daily to 5 mg twice daily as needed -Continue cefepime today, de-escalate tomorrow once sensitivities available  Ecoli/Klebsiella UTI -as above  Thrombocytopenia -acute, due to mild sepsis, baseline normal -will trend  Chronic diastolic CHF -hold lasix one more day, Na is slightly worse at 147 today  P. atrial fibrillation -Continue Toprol-XL -per Cards not on anticoagulation at baseline due to falls -Diltiazem was held due to bradycardia, monitor  Hypertension -Continue Toprol, hydralazine -BP on higher side, resume diuretics tomorrow  DVT prophylaxis: SCDs Code Status: Full Code Family Communication: no family at bedside Disposition Plan: keep in 4W since improving  Consultants:      Procedures:   Antimicrobials:    Subjective: -feels better, mental status is  improving  Objective: Vitals:   03/30/19 1936 03/30/19 1957 03/31/19 0526 03/31/19 0746  BP: (!) 163/69  (!) 188/84 (!) 171/76  Pulse: 68  70 79  Resp: 14  14   Temp: (!) 96.9 F (36.1 C) (!) 97.4 F (36.3 C) (!) 96.2 F (35.7 C) (!) 97.5 F (36.4 C)  TempSrc: Axillary Oral Axillary Oral  SpO2: 97% 97% 96%   Weight:  76 kg 75.4 kg   Height:        Intake/Output Summary (Last 24 hours) at 03/31/2019 1350 Last data filed at 03/31/2019 1320 Gross per 24 hour  Intake 600 ml  Output --  Net 600 ml   Filed Weights   03/29/19 0609 03/30/19 1957 03/31/19 0526  Weight: 80.2 kg 76 kg 75.4 kg    Examination:  Gen: Elderly frail, female, sitting in the wheelchair, alert awake oriented to self and place, lucid today HEENT: PERRLA, Neck supple, no JVD Lungs: CTAB CVS: RRR,No Gallops,Rubs or new Murmurs Abd: soft, Non tender, non distended, BS present Extremities: 1 plus edema Skin: no new rashes Psychiatry: flat affect     Data Reviewed:   CBC: Recent Labs  Lab 03/28/19 0705 03/29/19 0628 03/29/19 1848 03/30/19 0104 03/31/19 0610  WBC 5.0 10.2 7.9 7.3 5.9  NEUTROABS  --   --  6.4 5.9  --   HGB 10.5* 10.7* 9.8* 9.7* 9.9*  HCT 32.7* 33.4* 31.5* 31.4* 32.4*  MCV 98.8 98.8 97.8 98.4 102.2*  PLT 85* 90* 92* 88* 91*   Basic Metabolic Panel: Recent Labs  Lab 03/28/19 0705 03/29/19 0628 03/29/19 1848 03/30/19 0104 03/31/19 0610  NA 145 143 144 146* 147*  K 3.9 4.0 4.0 3.8 3.7  CL 102 101 103 104 106  CO2 29  29 30 29 27   GLUCOSE 106* 121* 78 118* 105*  BUN 43* 45* 46* 47* 32*  CREATININE 1.26* 1.17* 1.21* 1.39* 1.24*  CALCIUM 9.6 9.7 9.5 9.0 9.7   GFR: Estimated Creatinine Clearance: 33.6 mL/min (A) (by C-G formula based on SCr of 1.24 mg/dL (H)). Liver Function Tests: Recent Labs  Lab 03/29/19 1848 03/31/19 0610  AST 24 16  ALT 30 23  ALKPHOS 95 101  BILITOT 0.9 1.1  PROT 6.3* 6.3*  ALBUMIN 3.1* 2.9*   No results for input(s): LIPASE, AMYLASE in the  last 168 hours. Recent Labs  Lab 03/29/19 1848  AMMONIA 28   Coagulation Profile: No results for input(s): INR, PROTIME in the last 168 hours. Cardiac Enzymes: No results for input(s): CKTOTAL, CKMB, CKMBINDEX, TROPONINI in the last 168 hours. BNP (last 3 results) No results for input(s): PROBNP in the last 8760 hours. HbA1C: No results for input(s): HGBA1C in the last 72 hours. CBG: Recent Labs  Lab 03/29/19 1639  GLUCAP 97   Lipid Profile: No results for input(s): CHOL, HDL, LDLCALC, TRIG, CHOLHDL, LDLDIRECT in the last 72 hours. Thyroid Function Tests: Recent Labs    03/29/19 1848  TSH 5.339*  FREET4 1.00  T3FREE 2.2   Anemia Panel: No results for input(s): VITAMINB12, FOLATE, FERRITIN, TIBC, IRON, RETICCTPCT in the last 72 hours. Urine analysis:    Component Value Date/Time   COLORURINE YELLOW 03/29/2019 1800   APPEARANCEUR CLEAR 03/29/2019 1800   LABSPEC 1.010 03/29/2019 1800   PHURINE 5.0 03/29/2019 1800   GLUCOSEU NEGATIVE 03/29/2019 1800   HGBUR SMALL (A) 03/29/2019 1800   BILIRUBINUR NEGATIVE 03/29/2019 1800   KETONESUR NEGATIVE 03/29/2019 1800   PROTEINUR 30 (A) 03/29/2019 1800   UROBILINOGEN 0.2 09/04/2014 1340   NITRITE POSITIVE (A) 03/29/2019 1800   LEUKOCYTESUR NEGATIVE 03/29/2019 1800   Sepsis Labs: @LABRCNTIP (procalcitonin:4,lacticidven:4)  ) Recent Results (from the past 240 hour(s))  Culture, Urine     Status: Abnormal (Preliminary result)   Collection Time: 03/29/19  5:40 PM  Result Value Ref Range Status   Specimen Description URINE, CATHETERIZED  Final   Special Requests NONE  Final   Culture (A)  Final    >=100,000 COLONIES/mL KLEBSIELLA PNEUMONIAE >=100,000 COLONIES/mL ESCHERICHIA COLI SUSCEPTIBILITIES TO FOLLOW Performed at [facility] [address], [place], [place] [address]    Report Status PENDING  Incomplete  Culture, blood (routine x 2)     Status: None (Preliminary result)   Collection Time: 03/29/19  6:50 PM    Result Value Ref Range Status   Specimen Description BLOOD LEFT ARM  Final   Special Requests   Final    BOTTLES DRAWN AEROBIC ONLY Blood Culture adequate volume   Culture   Final    NO GROWTH 2 DAYS Performed at [facility], [address], [place], [place] [address]    Report Status PENDING  Incomplete  Culture, blood (routine x 2)     Status: None (Preliminary result)   Collection Time: 03/29/19  7:03 PM  Result Value Ref Range Status   Specimen Description BLOOD RIGHT ARM  Final   Special Requests   Final    BOTTLES DRAWN AEROBIC ONLY Blood Culture adequate volume   Culture   Final    NO GROWTH 2 DAYS Performed at [facility], [place] [address], [place], [place] [address]    Report Status PENDING  Incomplete         Radiology Studies: Dg  Chest 2 View  Result Date: 03/30/2019 CLINICAL DATA:  Fever. EXAM: CHEST - 2 VIEW COMPARISON:  Radiographs of March 29, 2019. FINDINGS: Stable cardiomegaly. Mild central pulmonary vascular congestion is noted. Stable left basilar atelectasis or infiltrate is noted with small pleural effusion. No pneumothorax is noted. Status post surgical repair of old left clavicular fracture. Status post left shoulder arthroplasty. Postsurgical changes are seen involving the lower thoracic spine. IMPRESSION: Stable cardiomegaly with mild central pulmonary vascular congestion stable left basilar subsegmental atelectasis or infiltrate is noted with small left pleural effusion. Electronically Signed   By: Marijo Conception M.D.   On: 03/30/2019 15:15   Dg Chest 2 View  Result Date: 03/29/2019 CLINICAL DATA:  Headache and unresponsive. Hypertension. CHF. Renal failure. EXAM: CHEST - 2 VIEW COMPARISON:  For 620 FINDINGS: Thoracic spine fixation. Left shoulder arthroplasty. Midline trachea. Mild cardiomegaly, accentuated by AP portable frontal radiograph and extremely low lung volumes. Small left pleural effusion persists. No pneumothorax. Interstitial  prominence is at least partially felt to be due to low lung volumes. Similar left base airspace disease. Mild right base atelectasis, improved. IMPRESSION: Minimal improvement in right base aeration period. Persistent left base airspace disease, suspicious for infection or aspiration. Similar small left pleural effusion. Electronically Signed   By: Abigail Miyamoto M.D.   On: 03/29/2019 18:18   Ct Head Wo Contrast  Result Date: 03/29/2019 CLINICAL DATA:  Headaches. Recent fall. EXAM: CT HEAD WITHOUT CONTRAST TECHNIQUE: Contiguous axial images were obtained from the base of the skull through the vertex without intravenous contrast. COMPARISON:  03/09/2019 FINDINGS: Brain: There is no evidence of acute infarct, intracranial hemorrhage, mass, midline shift, or extra-axial fluid collection. Mild cerebral atrophy is again noted. Cerebral white matter hypodensities are unchanged and nonspecific but compatible with mild chronic small vessel ischemic disease. Chronic lacunar infarcts are again seen in the left basal ganglia. Vascular: Calcified atherosclerosis at the skull base. No hyperdense vessel. Skull: No fracture or focal osseous lesion. Sinuses/Orbits: Small volume abnormal soft tissue inferiorly in the left orbit, extraconal in location and unchanged from the recent prior CT though smaller than on a 2018 MRI. Unchanged 5 mm nodular soft tissue focus between the left globe and medial rectus muscle. Bilateral cataract extraction. Visualized paranasal sinuses and mastoid air cells are clear. Other: None. IMPRESSION: 1. No evidence of acute intracranial abnormality. 2. Mild chronic small vessel ischemic disease. Electronically Signed   By: Logan Bores M.D.   On: 03/29/2019 18:02   Mr Brain Wo Contrast  Result Date: 03/30/2019 CLINICAL DATA:  Slurred speech.  Word-finding deficits. EXAM: MRI HEAD WITHOUT CONTRAST TECHNIQUE: Multiplanar, multiecho pulse sequences of the brain and surrounding structures were obtained  without intravenous contrast. COMPARISON:  Head CT 03/29/2019 FINDINGS: Brain: There is no evidence of acute infarct, intracranial hemorrhage, mass, midline shift, or extra-axial fluid collection. There is mild cerebral atrophy. T2 hyperintensities in the cerebral white matter and pons have slightly progressed from the prior MRI and are nonspecific but compatible with mild chronic small vessel ischemic disease. Chronic left basal ganglia lacunar infarcts are new from the prior MRI. Vascular: Major intracranial vascular flow voids are preserved. Skull and upper cervical spine: Unremarkable bone marrow signal. Sinuses/Orbits: Bilateral cataract extraction. Clear paranasal sinuses. Small right mastoid effusion. Other: None. IMPRESSION: 1. No acute intracranial abnormality. 2. Mild chronic small vessel ischemic disease, progressed from 2018. Electronically Signed   By: Logan Bores M.D.   On: 03/30/2019 14:55  Scheduled Meds:  aspirin EC  81 mg Oral Daily   FLUoxetine  20 mg Oral Daily   fluticasone  1 spray Each Nare Daily   folic acid  1 mg Oral Daily   [START ON 04/01/2019] furosemide  20 mg Oral Daily   Gerhardt's butt cream   Topical TID   hydrALAZINE  75 mg Oral Q8H   ketotifen  1 drop Both Eyes BID   levothyroxine  100 mcg Oral Q0600   metoprolol succinate  75 mg Oral Daily   pantoprazole  40 mg Oral Daily   QUEtiapine  25 mg Oral QHS   saccharomyces boulardii  250 mg Oral BID   senna  2 tablet Oral QHS   simvastatin  10 mg Oral QHS   thiamine  100 mg Oral Daily   venlafaxine XR  37.5 mg Oral Q breakfast   Continuous Infusions:  ceFEPime (MAXIPIME) IV 2 g (03/30/19 1749)     LOS: 18 days    Time spent: 2min    Domenic Polite, MD [facility]  03/31/2019, 1:50 PM

## 2019-03-31 NOTE — Progress Notes (Signed)
Patient temperature this afternoon was 89F. Warm blankets applied to patient and recheck of temp is at 94.45F. Patient remains alert and alert/oriented x4. No other issues noted. MD Naaman Plummer notified. No new orders received. Continue with plan of care.

## 2019-03-31 NOTE — Progress Notes (Signed)
Speech Language Pathology Daily Session Note  Patient Details  Name: [name] [name] MRN: [id number] Date of Birth: 07/08/1936  Today's Date: 03/31/2019   Skilled treatment session #1 SLP Individual Time: 0725-0755 SLP Individual Time Calculation (min): 30 min   Skilled treatment session #2 SLP Individual Time: 1607-3710 SLP Individual Time Calculation (min): 15 min  Short Term Goals: Week 3: SLP Short Term Goal 1 (Week 3): Pt will consume current diet with minimal overt s/s of aspiration/dysphagia and Min A cues for use of ocmpensatory swallow strategies.  SLP Short Term Goal 2 (Week 3): Pt will consume trials of thin liquids with minimal overt s/s of aspiration over 3 sessions prior to diet upgrade.  SLP Short Term Goal 3 (Week 3): Pt will cosnume soft solids with minimal overt s/s of aspiration over 3 sessions prior to diet upgrade.   Skilled Therapeutic Interventions:     Skilled treatment session #1 focused on dysphagia goals. SLP facilitiated session by providing skilled observation of pt consuming ice chips and thin trials of thin water via cup sips. Pt more alert this morning and talkative. Cognitive linguistic function continues to appear at previous baseline prior to recent medical decline. Pt recognized her MD when he arrived to room. Pt free of overt s/s of aspiration when consuming ice chips. Pt's swallow appeared swift with clear vocal quality and no change in vitals. However, when consuming small sip of water via cup, pt with significant likelihood os decreased airway compromise. Massive coughing, wet respirations and gasping for breaths. Recommend instrumental swallow study to assess for safest diet and for differential help in preparing for discharge to prevent further medical decline.   Skilled treatment session #2 focused on dysphagia goals. SLP faciiltiated session by providing further trials of ice chips. Pt consumed without overt s/s of aspiration. SLP further facilitated  session by providing small tsp of thin liquids. Pt with immediate throat clears with each bolus indicating possibility of airway compromise. MD agreeable with proceeding with MBS on 04/02/19. Schedule modified to include MBS, order placed.   Pain Pain Assessment Pain Scale: 0-10 Pain Score: 0-No pain  Therapy/Group: Individual Therapy  Itzael Liptak 03/31/2019, 9:19 AM

## 2019-03-31 NOTE — Evaluation (Signed)
Physical Therapy Assessment and Plan  Patient Details  Name: [name] [name] MRN: [id number] Date of Birth: 08-Sep-1936  PT Diagnosis: Difficulty walking, Impaired cognition and Muscle weakness Rehab Potential: Good ELOS: 3 to 5 days   Today's Date: 03/31/2019 PT Individual Time: 0355-9741 PT Individual Time Calculation (min): 57 min    Problem List:  Patient Active Problem List   Diagnosis Date Noted  . Sepsis ([place]) 03/29/2019  . Bradycardia 03/29/2019  . Somnolence 03/29/2019  . Acute blood loss anemia   . Chronic diastolic congestive heart failure ([place])   . Debility 03/13/2019  . Hypothermia 03/09/2019  . Elevated liver enzymes 03/09/2019  . Thrombocytopenia ([place]) 03/09/2019  . Hyperkalemia 07/23/2017  . AKI (acute kidney injury) ([place]) 07/23/2017  . 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 Fall 06/26/2017  . Intractable nausea and vomiting 03/11/2017  . Acute lower UTI 03/11/2017  . Carotid stenosis 12/28/2016  . Sedative, hypnotic, or anxiolytic withdrawal delirium 11/15/2016  . Paroxysmal atrial fibrillation ([facility])   . Cerebrovascular disease   . Fever   . Acute encephalopathy 11/10/2016  . Hypothyroidism 11/10/2016  . Hypertensive urgency 11/10/2016  . Altered mental state 11/10/2016  . Seizure ([place]) 11/10/2016  . Chronic diastolic CHF (congestive heart failure) ([place])   . Left carotid stenosis 10/29/2016  . S/P total knee replacement 03/10/2016  . 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 Ileus ([place]) 09/05/2014  . Leukocytosis 09/05/2014  . Vomiting 09/04/2014  . Pulmonary edema 09/04/2014  . Hypokalemia 08/28/2014  . Enteritis due to Clostridium difficile 08/27/2014  . 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 Protein calorie malnutrition ([place]) 08/24/2014  . Obstructive jaundice 08/23/2014  . Acute cholangitis 08/23/2014  . ARF (acute renal failure) ([place]) 08/23/2014  . Coagulopathy ([place]) 08/23/2014  . Back pain   .  Arthritis   . Hypertension   . GERD (gastroesophageal reflux disease)   . Anxiety   . IBS (irritable bowel syndrome)   . Hernia of Right posterior flank 04/18/2012    Past Medical History:  Past Medical History:  Diagnosis Date  . Alopecia   . Anemia    hx of years ago   . Anxiety   . ARF (acute renal failure) ([place]) 08/23/2014  . Arthritis    psoriatic arthritis  . Back pain   . Blood transfusion    at age of 4  . Carotid stenosis 12/2016  . Chronic diastolic CHF (congestive heart failure) ([place])   . Depression   . Dysrhythmia 11/13/2016   PAFib for a short time- Echo done 11/14/16 PAF- to follow up with PCP- to see if she is a candiate for anticoags.  Not started at times time due to alcohol abuse.  [name] GERD (gastroesophageal reflux disease)   . H/O hiatal hernia   . History of acute cholangitis   . History of GI diverticular bleed   . Hyperlipidemia   . Hypertension   . Hypothyroidism   . IBS (irritable bowel syndrome)   . Pancreatitis   . Peripheral vascular disease ([place])   . PONV (postoperative nausea and vomiting)    with Breast reduction- only time  . Rosacea   . Seizures ([place]) 11/13/2016   Thopught to be from withdrawl Benzodiazepine  . Stroke [facility])    patient denies-  . Thyroid disease    Past Surgical History:  Past Surgical History:  Procedure Laterality Date  . ABDOMINAL HYSTERECTOMY     partial  .  BACK SURGERY  2010   thoractic-screws and rods  . BILE DUCT STENT PLACEMENT  08/26/2014  . BREAST REDUCTION SURGERY    . CHOLECYSTECTOMY  1988   lap  . COLONOSCOPY    . COLONOSCOPY Left 09/08/2014   Procedure: COLONOSCOPY;  Surgeon: Arta Silence, MD;  Location: [facility] ENDOSCOPY;  Service: Endoscopy;  Laterality: Left;  . ENDARTERECTOMY Left 12/28/2016  . ENDARTERECTOMY Left 12/28/2016   Procedure: ENDARTERECTOMY CAROTID LEFT WITH XENOSURE BIOLOGIC PATCH ANGIOPLASTY;  Surgeon: Angelia Mould, MD;  Location: [place];  Service: Vascular;  Laterality: Left;   . ERCP    . ERCP N/A 08/26/2014   Procedure: ENDOSCOPIC RETROGRADE CHOLANGIOPANCREATOGRAPHY (ERCP);  Surgeon: Jeryl Columbia, MD;  Location: [facility] ENDOSCOPY;  Service: Endoscopy;  Laterality: N/A;  . ERCP N/A 09/11/2014   Procedure: ENDOSCOPIC RETROGRADE CHOLANGIOPANCREATOGRAPHY (ERCP);  Surgeon: Arta Silence, MD;  Location: [facility] ENDOSCOPY;  Service: Endoscopy;  Laterality: N/A;  . EUS N/A 09/11/2014   Procedure: ESOPHAGEAL ENDOSCOPIC ULTRASOUND (EUS) RADIAL;  Surgeon: Arta Silence, MD;  Location: [facility] ENDOSCOPY;  Service: Endoscopy;  Laterality: N/A;  . EYE SURGERY Bilateral    Cataract  . JOINT REPLACEMENT  2005   left shoulder replacement   . ORIF CLAVICULAR FRACTURE Left 07/18/2014   Procedure: OPEN REDUCTION INTERNAL FIXATION (ORIF) LEFT CLAVICULAR FRACTURE;  Surgeon: Ninetta Lights, MD;  Location: [place];  Service: Orthopedics;  Laterality: Left;  . SPYGLASS CHOLANGIOSCOPY N/A 09/11/2014   Procedure: SPYGLASS CHOLANGIOSCOPY;  Surgeon: Arta Silence, MD;  Location: [facility] ENDOSCOPY;  Service: Endoscopy;  Laterality: N/A;  . TONSILLECTOMY  as child  . TOTAL KNEE ARTHROPLASTY Left 03/10/2016   Procedure: LEFT TOTAL KNEE ARTHROPLASTY;  Surgeon: Ninetta Lights, MD;  Location: [place];  Service: Orthopedics;  Laterality: Left;  [name] VENTRAL HERNIA REPAIR  06/08/2012   Procedure: LAPAROSCOPIC VENTRAL HERNIA;  Surgeon: Adin Hector, MD;  Location: [facility] ORS;  Service: General;  Laterality: Right;    Assessment & Plan Clinical Impression: Patient is a 83 y.o. year old female  with history of HTN, PAF--no AC due to falls, psoriatic arthritis, anxiety/depression, chronic diastolic CHF, who was admitted on 03/09/19 after falling in the middle of the night and laid on the tile floor till family was able to get help next morning. Per reports, has had increased in falls with worsening of tremors and weakness --has been spending most of the day in bed for past 2 weeks. She was found to be hypothermic  with temp 93 degrees, was confused with strong odor of urine. She was warmed by bear hugger, started on IVF for AKI with evidence of rhabdomyolysis as well as antibiotics empirically due to concerns of sepsis. Urine/CXR negative for infectious work up and IV antibiotics transitioned to Augmentin due to concerns of aspiration PNA. 1/4 BC positive for gram negative rods and felt to be contaminant.   On CIWA protocol due to concerns of alcohol abuse---home dose librium increased to 10 mg tid. She has had issues with A fib with RVR treated with Cardizem drip. Rectal pouch placed due to diarrhea.Fluid overload treated with dose of lasix today.Therapy ongoing and patient noted to be debilitated with difficulty standing or performing ADLs as well as problems processing with STM deficits.Patient transferred to CIR on 03/13/2019 .    Pt and daughter completed fam education.  Family then declined the day before d/c to take her home reporting it was too much care.  The pt then had a change in  status with declining performance in basic ADL and mobility tasks and in mental status. Therapy was recommended to see and evaluate her again to try to get her back to her prior level of function. D/c plan is now SNF.  Patient currently requires min with mobility secondary to muscle weakness, decreased cardiorespiratoy endurance and decreased safety awareness.  Prior to hospitalization, patient was modified independent  with mobility and lived with Daughter in a House home.  Home access is 1Stairs to enter.  Patient will benefit from skilled PT intervention to maximize safe functional mobility, minimize fall risk and decrease caregiver burden for planned discharge SNF with 24 hour assist.  Anticipate patient will benefit from PT at discharge.  PT - End of Session Activity Tolerance: Tolerates 30+ min activity with multiple rests Endurance Deficit: Yes Endurance Deficit Description: fatigues quickly with self-care  task PT Assessment Rehab Potential (ACUTE/IP ONLY): Good PT Barriers to Discharge: Decreased caregiver support;Home environment access/layout PT Patient demonstrates impairments in the following area(s): Balance;Endurance;Safety;Motor PT Transfers Functional Problem(s): Bed to Chair;Car PT Locomotion Functional Problem(s): Wheelchair Mobility;Stairs;Ambulation PT Plan PT Intensity: Minimum of 1-2 x/day ,45 to 90 minutes PT Frequency: 5 out of 7 days PT Duration Estimated Length of Stay: 3 to 5 days PT Treatment/Interventions: Ambulation/gait training;Balance/vestibular training;Discharge planning;DME/adaptive equipment instruction;Functional mobility training;Neuromuscular re-education;Patient/family education;Stair training;Therapeutic Activities;Therapeutic Exercise;UE/LE Strength taining/ROM;UE/LE Coordination activities;Wheelchair propulsion/positioning PT Transfers Anticipated Outcome(s): S transfers PT Locomotion Anticipated Outcome(s): S gait, c/g stairs PT Recommendation Follow Up Recommendations: Skilled nursing facility Patient destination: [place] (SNF) Equipment Recommended: To be determined  Skilled Therapeutic Intervention PT evaluation completed and treatment plan initiated. Pt performed multiple sit to stand transfers with S and stand pivot transfers with rolling walker and c/g and verbals. Pt required multiple rest breaks. Pt returned to room following treatment and left sitting up in w/c with chair alarm in place.   PT Evaluation Precautions/Restrictions Precautions Precautions: Fall Precaution Comments: O2 sats Restrictions Weight Bearing Restrictions: No General Chart Reviewed: Yes Family/Caregiver Present: No Vital Signs Pain Pain Assessment Pain Scale: 0-10 Pain Score: 0-No pain Home Living/Prior Functioning Home Living Available Help at Discharge: [place] Type of Home: House Home Access: Stairs to enter Engineer, site of Steps: 1 Entrance Stairs-Rails: None  Lives With: Daughter Prior Function Level of Independence: Independent with gait;Independent with transfers  Able to Take Stairs?: Yes Driving: No Vocation: Retired Art gallery manager: Within Advertising copywriter Praxis Praxis: Intact  Cognition Overall Cognitive Status: Impaired/Different from baseline Arousal/Alertness: Awake/alert Orientation Level: Oriented to person;Oriented to place;Oriented to situation Memory: Impaired Memory Impairment: Decreased recall of new information Safety/Judgment: Impaired Sensation Sensation Light Touch: Appears Intact Proprioception: Appears Intact Coordination Gross Motor Movements are Fluid and Coordinated: No Motor  Motor Motor: Within Functional Limits Motor - Skilled Clinical Observations: generalized weakness  Mobility Bed Mobility Bed Mobility: Rolling Right;Rolling Left;Supine to Sit;Sit to Supine Rolling Right: Supervision/verbal cueing Rolling Left: Supervision/Verbal cueing Supine to Sit: Contact Guard/Touching assist Sit to Supine: Supervision/Verbal cueing Transfers Transfers: Sit to Stand;Stand to Sit;Stand Pivot Transfers Sit to Stand: Supervision/Verbal cueing Stand to Sit: Supervision/Verbal cueing Stand Pivot Transfers: Contact Guard/Touching assist;Supervision/Verbal cueing Stand Pivot Transfer Details: Verbal cues for technique;Verbal cues for precautions/safety;Verbal cues for safe use of DME/AE;Tactile cues for placement Transfer (Assistive device): Rolling walker Locomotion  Gait Ambulation: Yes Gait Assistance: Contact Guard/Touching assist Gait Distance (Feet): 50 Feet Assistive device: Rolling walker Gait Gait Pattern: Impaired Gait velocity: decreased Stairs / Additional Locomotion Stairs: Yes Stairs Assistance:  Minimal Assistance - Patient > 75% Stair Management Technique: With walker Number of Stairs: 1 Height of Stairs:  6 Wheelchair Mobility Wheelchair Mobility: Yes Wheelchair Assistance: Chartered loss adjuster: Both upper extremities Wheelchair Parts Management: Needs assistance Distance: 150  Trunk/Postural Assessment  Cervical Assessment Cervical Assessment: Exceptions to [facility] Thoracic Assessment Thoracic Assessment: Within Functional Limits Lumbar Assessment Lumbar Assessment: Within Functional Limits Postural Control Postural Control: Deficits on evaluation Righting Reactions: delayed; insufficient  Balance Static Sitting Balance Static Sitting - Balance Support: No upper extremity supported;Feet supported Static Sitting - Level of Assistance: 5: Stand by assistance Dynamic Sitting Balance Dynamic Sitting - Level of Assistance: 5: Stand by assistance;6: Modified independent (Device/Increase time) Static Standing Balance Static Standing - Balance Support: Bilateral upper extremity supported;During functional activity Static Standing - Level of Assistance: 4: Min assist;5: Stand by assistance Dynamic Standing Balance Dynamic Standing - Balance Support: Bilateral upper extremity supported;During functional activity Dynamic Standing - Level of Assistance: 4: Min assist Extremity Assessment  RUE Assessment General Strength Comments: strength grossly 3+/5, able to use WFL LUE Assessment Active Range of Motion (AROM) Comments: WFL (h/o shoulder surgery) General Strength Comments: grossly 4/5,  RLE Assessment RLE Assessment: Exceptions to [facility] Passive Range of Motion (PROM) Comments: WFLs General Strength Comments: grossly 3-/5 to 3/5 LLE Assessment LLE Assessment: Exceptions to [facility] Passive Range of Motion (PROM) Comments: WFLs General Strength Comments: grossly 3-/5 to 3/5    Refer to Care Plan for Long Term Goals  Recommendations for other services: None   Discharge Criteria: Patient will be discharged from PT if patient refuses treatment 3 consecutive times  without medical reason, if treatment goals not met, if there is a change in medical status, if patient makes no progress towards goals or if patient is discharged from hospital.  The above assessment, treatment plan, treatment alternatives and goals were discussed and mutually agreed upon: by patient  Dub Amis 03/31/2019, 12:39 PM

## 2019-03-31 NOTE — Evaluation (Signed)
Occupational Therapy Assessment and Plan  Patient Details  Name: [name] [name] MRN: [id number] Date of Birth: Apr 23, 1936  OT Diagnosis: altered mental status and muscle weakness (generalized) Rehab Potential: Rehab Potential (ACUTE ONLY): Good ELOS: ~3-5 days   Today's Date: 03/31/2019 OT Individual Time: 5916-3846 OT Individual Time Calculation (min): 45 min     Problem List:  Patient Active Problem List   Diagnosis Date Noted  . Sepsis ([place]) 03/29/2019  . Bradycardia 03/29/2019  . Somnolence 03/29/2019  . Acute blood loss anemia   . Chronic diastolic congestive heart failure ([place])   . Debility 03/13/2019  . Hypothermia 03/09/2019  . Elevated liver enzymes 03/09/2019  . Thrombocytopenia ([place]) 03/09/2019  . Hyperkalemia 07/23/2017  . AKI (acute kidney injury) ([place]) 07/23/2017  . 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 Fall 06/26/2017  . Intractable nausea and vomiting 03/11/2017  . Acute lower UTI 03/11/2017  . Carotid stenosis 12/28/2016  . Sedative, hypnotic, or anxiolytic withdrawal delirium 11/15/2016  . Paroxysmal atrial fibrillation ([facility])   . Cerebrovascular disease   . Fever   . Acute encephalopathy 11/10/2016  . Hypothyroidism 11/10/2016  . Hypertensive urgency 11/10/2016  . Altered mental state 11/10/2016  . Seizure ([place]) 11/10/2016  . Chronic diastolic CHF (congestive heart failure) ([place])   . Left carotid stenosis 10/29/2016  . S/P total knee replacement 03/10/2016  . 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 Ileus ([place]) 09/05/2014  . Leukocytosis 09/05/2014  . Vomiting 09/04/2014  . Pulmonary edema 09/04/2014  . Hypokalemia 08/28/2014  . Enteritis due to Clostridium difficile 08/27/2014  . 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 Protein calorie malnutrition ([place]) 08/24/2014  . Obstructive jaundice 08/23/2014  . Acute cholangitis 08/23/2014  . ARF (acute renal failure) ([place]) 08/23/2014  . Coagulopathy ([place])  08/23/2014  . Back pain   . Arthritis   . Hypertension   . GERD (gastroesophageal reflux disease)   . Anxiety   . IBS (irritable bowel syndrome)   . Hernia of Right posterior flank 04/18/2012    Past Medical History:  Past Medical History:  Diagnosis Date  . Alopecia   . Anemia    hx of years ago   . Anxiety   . ARF (acute renal failure) ([place]) 08/23/2014  . Arthritis    psoriatic arthritis  . Back pain   . Blood transfusion    at age of 4  . Carotid stenosis 12/2016  . Chronic diastolic CHF (congestive heart failure) ([place])   . Depression   . Dysrhythmia 11/13/2016   PAFib for a short time- Echo done 11/14/16 PAF- to follow up with PCP- to see if she is a candiate for anticoags.  Not started at times time due to alcohol abuse.  [name] GERD (gastroesophageal reflux disease)   . H/O hiatal hernia   . History of acute cholangitis   . History of GI diverticular bleed   . Hyperlipidemia   . Hypertension   . Hypothyroidism   . IBS (irritable bowel syndrome)   . Pancreatitis   . Peripheral vascular disease ([place])   . PONV (postoperative nausea and vomiting)    with Breast reduction- only time  . Rosacea   . Seizures ([place]) 11/13/2016   Thopught to be from withdrawl Benzodiazepine  . Stroke [facility])    patient denies-  . Thyroid disease    Past Surgical History:  Past Surgical History:  Procedure Laterality Date  . ABDOMINAL HYSTERECTOMY  partial  . BACK SURGERY  2010   thoractic-screws and rods  . BILE DUCT STENT PLACEMENT  08/26/2014  . BREAST REDUCTION SURGERY    . CHOLECYSTECTOMY  1988   lap  . COLONOSCOPY    . COLONOSCOPY Left 09/08/2014   Procedure: COLONOSCOPY;  Surgeon: Arta Silence, MD;  Location[facility]OSCOPY;  Service: Endoscopy;  Laterality: Left;  . ENDARTERECTOMY Left 12/28/2016  . ENDARTERECTOMY Left 12/28/2016   Procedure: ENDARTERECTOMY CAROTID LEFT WITH XENOSURE BIOLOGIC PATCH ANGIOPLASTY;  Surgeon: Angelia Mould, MD;  Location: [place];  Service:  Vascular;  Laterality: Left;  . ERCP    . ERCP N/A 08/26/2014   Procedure: ENDOSCOPIC RETROGRADE CHOLANGIOPANCREATOGRAPHY (ERCP);  Surgeon: Jeryl Columbia, MD;  Location: [facility] ENDOSCOPY;  Service: Endoscopy;  Laterality: N/A;  . ERCP N/A 09/11/2014   Procedure: ENDOSCOPIC RETROGRADE CHOLANGIOPANCREATOGRAPHY (ERCP);  Surgeon: Arta Silence, MD;  Location: [facility] ENDOS[facility];  Service: Endoscopy;  Laterality: N/A;  . EUS N/A 09/11/2014   Procedure: ESOPHAGEAL ENDOSCOPIC ULTRASOUND (EUS) RADIAL;  Surgeon: Arta Silence, MD;  Location: [facility] ENDOSCOPY;  Service: Endoscopy;  Laterality: N/A;  . EYE SURGERY Bilateral    Cataract  . JOINT REPLACEMENT  2005   left shoulder replacement   . ORIF CLAVICULAR FRACTURE Left 07/18/2014   Procedure: OPEN REDUCTION INTERNAL FIXATION (ORIF) LEFT CLAVICULAR FRACTURE;  Surgeon: Ninetta Lights, MD;  Location: [place];  Service: Orthopedics;  Laterality: Left;  . SPYGLASS CHOLANGIOSCOPY N/A 09/11/2014   Procedure: SPYGLASS CHOLANGIOSCOPY;  Surgeon: Arta Silence, MD;  Location: [facility] ENDOSCOPY;  Service: Endoscopy;  Laterality: N/A;  . TONSILLECTOMY  as child  . TOTAL KNEE ARTHROPLASTY Left 03/10/2016   Procedure: LEFT TOTAL KNEE ARTHROPLASTY;  Surgeon: Ninetta Lights, MD;  Location: [place];  Service: Orthopedics;  Laterality: Left;  [name] VENTRAL HERNIA REPAIR  06/08/2012   Procedure: LAPAROSCOPIC VENTRAL HERNIA;  Surgeon: Adin Hector, MD;  Location: [facility] ORS;  Service: General;  Laterality: Right;    Assessment & Plan Clinical Impression: Patient is a 83 y.o. year old female  with history of HTN, PAF--no AC due to falls, psoriatic arthritis, anxiety/depression, chronic diastolic CHF, who was admitted on 03/09/19 after falling in the middle of the night and laid on the tile floor till family was able to get help next morning. Per reports, has had increased in falls with worsening of tremors and weakness --has been spending most of the day in bed for past 2 weeks. She  was found to be hypothermic with temp 93 degrees, was confused with strong odor of urine. She was warmed by bear hugger, started on IVF for AKI with evidence of rhabdomyolysis as well as antibiotics empirically due to concerns of sepsis. Urine/CXR negative for infectious work up and IV antibiotics transitioned to Augmentin due to concerns of aspiration PNA. 1/4 BC positive for gram negative rods and felt to be contaminant.   On CIWA protocol due to concerns of alcohol abuse---home dose librium increased to 10 mg tid. She has had issues with A fib with RVR treated with Cardizem drip. Rectal pouch placed due to diarrhea.Fluid overload treated with dose of lasix today.Therapy ongoing and patient noted to be debilitated with difficulty standing or performing ADLs as well as problems processing with STM deficits.Patient transferred to CIR on 03/13/2019 .    Pt and daughter completed fam education.  Family then declined the day before d/c to take her home reporting it was too much care.  The pt then had  a change in status with declining performance in basic ADL and mobility tasks and in mental status. Therapy was recommended to see and evaluate her again to try to get her back to her prior level of function. D/c plan is now SNF.  Patient currently requires min with basic self-care skills and basic mobility  secondary to muscle weakness, decreased cardiorespiratoy endurance and decreased sitting balance, decreased standing balance and decreased postural control.  Prior to hospitalization, patient could complete adl with supervision.  Patient will benefit from skilled intervention to decrease level of assist with basic self-care skills and increase independence with basic self-care skills prior to discharge  Anticipate patient will require 24 hour supervision and SNF.  OT - End of Session Activity Tolerance: Tolerates 30+ min activity with multiple rests Endurance Deficit: Yes Endurance Deficit  Description: fatigues quickly with self-care task OT Assessment Rehab Potential (ACUTE ONLY): Good OT Patient demonstrates impairments in the following area(s): Balance;Cognition;Edema;Endurance;Motor;Safety;Skin Integrity OT Basic ADL's Functional Problem(s): Grooming;Bathing;Dressing;Toileting OT Transfers Functional Problem(s): Tub/Shower;Toilet OT Additional Impairment(s): None OT Plan OT Intensity: Minimum of 1-2 x/day, 45 to 90 minutes OT Frequency: 5 out of 7 days OT Duration/Estimated Length of Stay: ~3-5 days OT Treatment/Interventions: Balance/vestibular training;Cognitive remediation/compensation;Community reintegration;Discharge planning;Disease mangement/prevention;DME/adaptive equipment instruction;Functional mobility training;Pain management;Patient/family education;Psychosocial support;Self Care/advanced ADL retraining;Skin care/wound managment;Therapeutic Activities;Therapeutic Exercise;Splinting/orthotics;UE/LE Strength taining/ROM OT Self Feeding Anticipated Outcome(s): n/a OT Basic Self-Care Anticipated Outcome(s): supervision OT Toileting Anticipated Outcome(s): supervision OT Bathroom Transfers Anticipated Outcome(s): supervision OT Recommendation Patient destination: [place] (SNF) Follow Up Recommendations: Home health OT Equipment Details: already got equipmentdue to delayed d/c   Skilled Therapeutic Intervention OT eval initiated. Pt much more alert and oriented today compared to the last few days with her change in status. Pt easy aroused and participatory in therapy. Pt came to EOB with min A with extra time. Pt ambulated to the bathroom with RW with more than reasonable amt of time and with cues for increasing step length and speed. Pt performed toileting and toilet transfer with min A. Pt ambulated back out the room to her w/c. Pt was on room air in the session and O2 sats remained around 92% without DOB. Pt bathed UB with setup and LB with min A.  Pt donned night gown, TEDS and shoes. Pt with increased LB edema. Pt able to brush teeth and don wig with setup and follow directions to no consume any of the thin water.  Pass off to next therapist with nectar thick soda made for her.   Discussed with PT counterpart about goals remaining at supervision and setting short LOS to return to this level of assistance.   OT Evaluation Precautions/Restrictions  Precautions Precautions: Fall Precaution Comments: O2 sats  Restrictions Weight Bearing Restrictions: No General Chart Reviewed: Yes Family/Caregiver Present: No Vital Signs Therapy Vitals Temp: (!) 97.5 F (36.4 C) Temp Source: Oral Pulse Rate: 79 BP: (!) 171/76 Pain Pain Assessment Pain Scale: 0-10 Pain Score: 0-No pain Home Living/Prior Functioning Home Living Family/patient expects to be discharged to:: Private residence Living Arrangements: Children Available Help at Discharge: [place] Type of Home: House Home Access: Stairs to enter Technical brewer of Steps: 1 Entrance Stairs-Rails: None Home Layout: Other (Comment), Two level, 1/2 bath on main level, Bed/bath upstairs(full bath first level, sleeps on couch) Bathroom Shower/Tub: Chiropodist: Standard Bathroom Accessibility: Yes Additional Comments: patient sleeps on couch on main level; was doing this over past few months  Lives With: Daughter IADL History Homemaking  Responsibilities: Yes Meal Prep Responsibility: Secondary Laundry Responsibility: No Cleaning Responsibility: No Bill Paying/Finance Responsibility: No Homemaking Comments: daughter had been completing majority of laundry and cleaning and they ordered majority of their food Current License: No(driving until Dec 5916) Prior Function Level of Independence: Needs assistance with gait, Needs assistance with tranfers, Needs assistance with ADLs, Needs assistance with homemaking  Able to Take Stairs?:  Yes Driving: No Vocation: Retired ADL ADL Eating: Minimal assistance Grooming: Minimal assistance Where Assessed-Grooming: Sitting at sink Upper Body Bathing: Setup Where Assessed-Upper Body Bathing: Sitting at sink Lower Body Bathing: Minimal assistance Where Assessed-Lower Body Bathing: Sitting at sink, Standing at sink Upper Body Dressing: Minimal assistance(dress) Where Assessed-Upper Body Dressing: Sitting at sink Lower Body Dressing: (onlywearin  a  brief) Where Assessed-Lower Body Dressing: Sitting at sink, Standing at sink Toileting: Minimal assistance Toilet Transfer: Minimal assistance Toilet Transfer Method: Counselling psychologist: Energy manager: Not assessed Vision Baseline Vision/History: Wears glasses Wears Glasses: Reading only Patient Visual Report: No change from baseline Vision Assessment?: No apparent visual deficits Perception  Perception: Within Functional Limits Praxis Praxis: Intact Cognition Overall Cognitive Status: Impaired/Different from baseline Arousal/Alertness: Awake/alert Orientation Level: Person;Place;Situation Person: Oriented Place: Oriented Situation: Oriented Year: 2020 Month: April Day of Week: Correct Memory: Impaired Memory Impairment: Storage deficit;Retrieval deficit;Decreased recall of new information Immediate Memory Recall: Sock;Blue;Bed Memory Recall: Sock;Blue;Bed Memory Recall Sock: Without Cue Memory Recall Blue: Without Cue Memory Recall Bed: Without Cue Awareness: Impaired Awareness Impairment: Anticipatory impairment Behaviors: Lability(has presented withthis  yessterday) Sensation Sensation Light Touch: Appears Intact Proprioception: Appears Intact Coordination Gross Motor Movements are Fluid and Coordinated: Yes Fine Motor Movements are Fluid and Coordinated: Yes Coordination and Movement Description: impaired by generalized weakness, shakiness noted, pt notes shakiness has  baseline 2/2 medication side-effects.  Motor  Motor Motor - Skilled Clinical Observations: generalize     d       [place] Mobility  Bed Mobility Supine to Sit: Contact Guard/Touching assist Transfers Stand to Sit: Supervision/Verbal cueing;Contact Guard/Touching assist  Trunk/Postural Assessment  Cervical Assessment Cervical Assessment: Exceptions to WFL(forward posture) Thoracic Assessment Thoracic Assessment: Within Functional Limits(rounded shoulders) Lumbar Assessment Lumbar Assessment: Within Functional Limits Postural Control Righting Reactions: delayed; insufficient  Balance Static Sitting Balance Static Sitting - Level of Assistance: 6: Modified independent (Device/Increase time);5: Stand by assistance Dynamic Sitting Balance Dynamic Sitting - Level of Assistance: 5: Stand by assistance;6: Modified independent (Device/Increase time) Static Standing Balance Static Standing - Level of Assistance: 5: Stand by assistance;4: Min assist Extremity/Trunk Assessment RUE Assessment General Strength Comments: strength grossly 3+/5, able to use WFL LUE Assessment Active Range of Motion (AROM) Comments: WFL (h/o shoulder surgery) General Strength Comments: grossly 4/5,      Refer to Care Plan for Long Term Goals  Recommendations for other services: Neuropsych   Discharge Criteria: Patient will be discharged from OT if patient refuses treatment 3 consecutive times without medical reason, if treatment goals not met, if there is a change in medical status, if patient makes no progress towards goals or if patient is discharged from hospital.  The above assessment, treatment plan, treatment alternatives and goals were discussed and mutually agreed upon: by patient  Nicoletta Ba 03/31/2019, 10:03 AM

## 2019-03-31 NOTE — Progress Notes (Addendum)
[facility] PHYSICAL MEDICINE & REHABILITATION PROGRESS NOTE   Subjective/Complaints:  Pt looks much brighter today. Headache better. Able to sleep last night. Afebrile  ROS: Patient denies fever, rash, sore throat, blurred vision, nausea, vomiting, diarrhea, cough, shortness of breath or chest pain, joint or back pain, headache, or mood change.    Objective:   Dg Chest 2 View  Result Date: 03/30/2019 CLINICAL DATA:  Fever. EXAM: CHEST - 2 VIEW COMPARISON:  Radiographs of March 29, 2019. FINDINGS: Stable cardiomegaly. Mild central pulmonary vascular congestion is noted. Stable left basilar atelectasis or infiltrate is noted with small pleural effusion. No pneumothorax is noted. Status post surgical repair of old left clavicular fracture. Status post left shoulder arthroplasty. Postsurgical changes are seen involving the lower thoracic spine. IMPRESSION: Stable cardiomegaly with mild central pulmonary vascular congestion stable left basilar subsegmental atelectasis or infiltrate is noted with small left pleural effusion. Electronically Signed   By: Marijo Conception M.D.   On: 03/30/2019 15:15   Dg Chest 2 View  Result Date: 03/29/2019 CLINICAL DATA:  Headache and unresponsive. Hypertension. CHF. Renal failure. EXAM: CHEST - 2 VIEW COMPARISON:  For 620 FINDINGS: Thoracic spine fixation. Left shoulder arthroplasty. Midline trachea. Mild cardiomegaly, accentuated by AP portable frontal radiograph and extremely low lung volumes. Small left pleural effusion persists. No pneumothorax. Interstitial prominence is at least partially felt to be due to low lung volumes. Similar left base airspace disease. Mild right base atelectasis, improved. IMPRESSION: Minimal improvement in right base aeration period. Persistent left base airspace disease, suspicious for infection or aspiration. Similar small left pleural effusion. Electronically Signed   By: Abigail Miyamoto M.D.   On: 03/29/2019 18:18   Ct Head Wo  Contrast  Result Date: 03/29/2019 CLINICAL DATA:  Headaches. Recent fall. EXAM: CT HEAD WITHOUT CONTRAST TECHNIQUE: Contiguous axial images were obtained from the base of the skull through the vertex without intravenous contrast. COMPARISON:  03/09/2019 FINDINGS: Brain: There is no evidence of acute infarct, intracranial hemorrhage, mass, midline shift, or extra-axial fluid collection. Mild cerebral atrophy is again noted. Cerebral white matter hypodensities are unchanged and nonspecific but compatible with mild chronic small vessel ischemic disease. Chronic lacunar infarcts are again seen in the left basal ganglia. Vascular: Calcified atherosclerosis at the skull base. No hyperdense vessel. Skull: No fracture or focal osseous lesion. Sinuses/Orbits: Small volume abnormal soft tissue inferiorly in the left orbit, extraconal in location and unchanged from the recent prior CT though smaller than on a 2018 MRI. Unchanged 5 mm nodular soft tissue focus between the left globe and medial rectus muscle. Bilateral cataract extraction. Visualized paranasal sinuses and mastoid air cells are clear. Other: None. IMPRESSION: 1. No evidence of acute intracranial abnormality. 2. Mild chronic small vessel ischemic disease. Electronically Signed   By: Logan Bores M.D.   On: 03/29/2019 18:02   Mr Brain Wo Contrast  Result Date: 03/30/2019 CLINICAL DATA:  Slurred speech.  Word-finding deficits. EXAM: MRI HEAD WITHOUT CONTRAST TECHNIQUE: Multiplanar, multiecho pulse sequences of the brain and surrounding structures were obtained without intravenous contrast. COMPARISON:  Head CT 03/29/2019 FINDINGS: Brain: There is no evidence of acute infarct, intracranial hemorrhage, mass, midline shift, or extra-axial fluid collection. There is mild cerebral atrophy. T2 hyperintensities in the cerebral white matter and pons have slightly progressed from the prior MRI and are nonspecific but compatible with mild chronic small vessel ischemic  disease. Chronic left basal ganglia lacunar infarcts are new from the prior MRI. Vascular: Major intracranial vascular flow  voids are preserved. Skull and upper cervical spine: Unremarkable bone marrow signal. Sinuses/Orbits: Bilateral cataract extraction. Clear paranasal sinuses. Small right mastoid effusion. Other: None. IMPRESSION: 1. No acute intracranial abnormality. 2. Mild chronic small vessel ischemic disease, progressed from 2018. Electronically Signed   By: Logan Bores M.D.   On: 03/30/2019 14:55   Recent Labs    03/30/19 0104 03/31/19 0610  WBC 7.3 5.9  HGB 9.7* 9.9*  HCT 31.4* 32.4*  PLT 88* 91*   Recent Labs    03/30/19 0104 03/31/19 0610  NA 146* 147*  K 3.8 3.7  CL 104 106  CO2 29 27  GLUCOSE 118* 105*  BUN 47* 32*  CREATININE 1.39* 1.24*  CALCIUM 9.0 9.7    Intake/Output Summary (Last 24 hours) at 03/31/2019 1021 Last data filed at 03/30/2019 1807 Gross per 24 hour  Intake 240 ml  Output -  Net 240 ml     Physical Exam: Vital Signs Blood pressure (!) 171/76, pulse 79, temperature (!) 97.5 F (36.4 C), temperature source Oral, resp. rate 14, height 5\' 1"  (1.549 m), weight 75.4 kg, SpO2 96 %. Constitutional: No distress . Vital signs reviewed. HEENT: EOMI, oral membranes moist Neck: supple Cardiovascular: RRR without murmur. No JVD    Respiratory: CTA Bilaterally without wheezes or rales. Normal effort    GI: BS +, non-tender, non-distended  Musculoskeletal: Bilateral lower extremity tr to 1+ edema Neurological: oriented to place and person. Follows command. Speech more clear. Still struggling with thin liquids--cough.  Motor:   Bilateral lower extremities grossly 4/5 proximal to distal Ext- 1+ bilateral pre tib edema, stasis dermatitis changes -  Skin: Skin iswarmand dry. Psychiatric:bright, cooperative  Assessment/Plan: 1. Functional deficits secondary to rhabdomyolysis, debility which require 3+ hours per day of interdisciplinary therapy in a  comprehensive inpatient rehab setting.  Physiatrist is providing close team supervision and 24 hour management of active medical problems listed below.  Physiatrist and rehab team continue to assess barriers to discharge/monitor patient progress toward functional and medical goals  Care Tool:  Bathing    Body parts bathed by patient: Right arm, Left arm, Chest, Abdomen, Face, Right upper leg, Left upper leg, Front perineal area, Right lower leg, Left lower leg, Buttocks   Body parts bathed by helper: Buttocks     Bathing assist Assist Level: Contact Guard/Touching assist     Upper Body Dressing/Undressing Upper body dressing   What is the patient wearing?: Button up shirt    Upper body assist Assist Level: Moderate Assistance - Patient 50 - 74%    Lower Body Dressing/Undressing Lower body dressing      What is the patient wearing?: Pants, Underwear/pull up     Lower body assist Assist for lower body dressing: Contact Guard/Touching assist     Toileting Toileting    Toileting assist Assist for toileting: Contact Guard/Touching assist     Transfers Chair/bed transfer  Transfers assist  Chair/bed transfer activity did not occur: Safety/medical concerns  Chair/bed transfer assist level: Supervision/Verbal cueing     Locomotion Ambulation   Ambulation assist      Assist level: Contact Guard/Touching assist Assistive device: Walker-rolling Max distance: 50'   Walk 10 feet activity   Assist     Assist level: Contact Guard/Touching assist Assistive device: Walker-rolling   Walk 50 feet activity   Assist Walk 50 feet with 2 turns activity did not occur: Safety/medical concerns(fatigue )  Assist level: Contact Guard/Touching assist Assistive device: Walker-rolling    Walk  150 feet activity   Assist Walk 150 feet activity did not occur: Safety/medical concerns  Assist level: Contact Guard/Touching assist Assistive device: Walker-rolling     Walk 10 feet on uneven surface  activity   Assist Walk 10 feet on uneven surfaces activity did not occur: Safety/medical concerns         Wheelchair     Assist Will patient use wheelchair at discharge?: No Type of Wheelchair: Manual    Wheelchair assist level: Supervision/Verbal cueing Max wheelchair distance: 158ft     Wheelchair 50 feet with 2 turns activity    Assist        Assist Level: Supervision/Verbal cueing   Wheelchair 150 feet activity     Assist     Assist Level: Supervision/Verbal cueing    Medical Problem List and Plan: 1.Functional mobility deficitssecondary to rhabdomyolysis, pneumonia, ETOH abuse.    Cont CIR PT, OT, SLP as tolerated   -work toward discharge early next week  2. Antithrombotics: -DVT/anticoagulation:Pharmaceutical:Lovenox -antiplatelet therapy: ASA 3. Pain Management:tylenol prn. 4. Mood:team providing ego support  Neuropsych eval  -baseline anxiety present but seems to be managing fairly well at present -antipsychotic agents:  Seroquel and librium(used librium 10 mg bid for anxiety at home). 5. Neuropsych: This patientis?  Notfully capable of making decisions on herown behalf. 6. Skin/Wound Care:Routine pressure relief measures. 7. Fluids/Electrolytes/Nutrition:encourage PO, eating well as of late   9. AKI with mild rhabdomyolysis:    -BUN/Cr stable today 45/1.17  -lasix resumed per cards 40mg  daily for 3 days from 4/14 then 20mg  daily starting today    10.Chronic diastolic CHFwith exacerbation:  improved after diuresis   -recent chest x-ray demonstrates improvement Filed Weights   03/29/19 0609 03/30/19 1957 03/31/19 0526  Weight: 80.2 kg 76 kg 75.4 kg    -weight stable around 80kg  -appreciate cards input   -lasix   20mg  daily    -cardizem stopped 11. H/o anxiety/depression: Lost husband 12/19.Managed by Dr. Vilma Meckel and librium. 12.  Abnormal LFTs:normal 4/1 13. Fall risk: Continue high low bed.  14.  Constipation    -Improved  -continue probiotic  -sorbitol prn 15.  Essential hypertension/bradycardia  Hydralazine 50 3 times daily, increased to 75 3 times daily on 4/4   Lasix resumed  -cardizem stopped  Continue Toprol 75 daily  -HR  In 70's today      Vitals:   03/31/19 0526 03/31/19 0746  BP: (!) 188/84 (!) 171/76  Pulse: 70 79  Resp: 14   Temp: (!) 96.2 F (35.7 C) (!) 97.5 F (36.4 C)  SpO2: 96%     16.  Acute blood loss anemia  Hemoglobin  9.9 4/18  Continue to monitor 17.  Thrombocytopenia  Platelets up to 91k today  -have d/c'ed lovenox  18. Altered mental status: improved today, afebrile  -urine culture + for 100k GNR (E coli and Klebsiella)---suspect this was cause of AMS  -MRI negative for stroke  -LIBRIUM (not valium) changed to PRN by [facility] although patient has been on this for some time at home.   -continue maxipime pending UCX sensitivities  -called and updated daughter      LOS: 18 days A FACE TO FACE EVALUATION WAS PERFORMED  Meredith Staggers 03/31/2019, 10:21 AM

## 2019-03-31 NOTE — Plan of Care (Signed)
  Problem: Consults Goal: RH GENERAL PATIENT EDUCATION Description See Patient Education module for education specifics. Outcome: Progressing   Problem: RH BOWEL ELIMINATION Goal: RH STG MANAGE BOWEL WITH ASSISTANCE Description STG Manage Bowel with Mod Assistance.  Outcome: Progressing   Problem: RH SKIN INTEGRITY Goal: RH STG SKIN FREE OF INFECTION/BREAKDOWN Description No new breakdown with min assist    Outcome: Progressing   Problem: RH SAFETY Goal: RH STG ADHERE TO SAFETY PRECAUTIONS W/ASSISTANCE/DEVICE Description STG Adhere to Safety Precautions With Supervision Assistance/Device.  Outcome: Progressing Goal: RH STG DECREASED RISK OF FALL WITH ASSISTANCE Description STG Decreased Risk of Fall With supervision Assistance. min  Outcome: Progressing   Problem: RH PAIN MANAGEMENT Goal: RH STG PAIN MANAGED AT OR BELOW PT'S PAIN GOAL Description < 3 out of 10.   Outcome: Progressing

## 2019-04-01 ENCOUNTER — Inpatient Hospital Stay (HOSPITAL_COMMUNITY): Payer: Self-pay

## 2019-04-01 LAB — BASIC METABOLIC PANEL
Anion gap: 12 (ref 5–15)
BUN: 33 mg/dL — ABNORMAL HIGH (ref 8–23)
CO2: 25 mmol/L (ref 22–32)
Calcium: 9.5 mg/dL (ref 8.9–10.3)
Chloride: 106 mmol/L (ref 98–111)
Creatinine, Ser: 1.11 mg/dL — ABNORMAL HIGH (ref 0.44–1.00)
GFR calc Af Amer: 54 mL/min — ABNORMAL LOW (ref >60)
GFR calc non Af Amer: 47 mL/min — ABNORMAL LOW (ref >60)
Glucose, Bld: 97 mg/dL (ref 70–99)
Potassium: 3.8 mmol/L (ref 3.5–5.1)
Sodium: 143 mmol/L (ref 135–145)

## 2019-04-01 LAB — CBC
HCT: 32.3 % — ABNORMAL LOW (ref 36.0–46.0)
Hemoglobin: 9.6 g/dL — ABNORMAL LOW (ref 12.0–15.0)
MCH: 29.8 pg (ref 26.0–34.0)
MCHC: 29.7 g/dL — ABNORMAL LOW (ref 30.0–36.0)
MCV: 100.3 fL — ABNORMAL HIGH (ref 80.0–100.0)
Platelets: 99 10*3/uL — ABNORMAL LOW (ref 150–400)
RBC: 3.22 MIL/uL — ABNORMAL LOW (ref 3.87–5.11)
RDW: 13 % (ref 11.5–15.5)
WBC: 7.9 10*3/uL (ref 4.0–10.5)
nRBC: 0 % (ref 0.0–0.2)

## 2019-04-01 LAB — URINE CULTURE: Culture: >=100000 — AB

## 2019-04-01 MED ORDER — CEPHALEXIN 250 MG PO CAPS
250.0000 mg | ORAL_CAPSULE | Freq: Three times a day (TID) | ORAL | Status: DC
  Administered 2019-04-01 – 2019-04-03 (×6): 250 mg via ORAL
  Filled 2019-04-01 (×6): qty 1

## 2019-04-01 NOTE — Progress Notes (Signed)
PROGRESS NOTE    [name] [name]  BMW:[id number] DOB: 1936/08/31 DOA: 03/13/2019 PCP: [name], MD  Brief Narrative: 83 year old female with history of CAD, paroxysmal atrial fibrillation not on anticoagulation secondary to multiple falls, history of alcoholism, history of prior strokes, history of chronic DIASTOLIC CHF, anxiety, depression, polypharmacy, hypothyroidism was brought to the hospital after being found down on her porch she was noted to be hypothermic. -Work-up was unremarkable, there was a concern for possible aspiration type event which was felt to be mild and treated with antibiotics -Sequently discharged to CIR for rehabilitation on 3/31 -Had been working with PT, OT and SLP, she was noted to be more drowsy and hypothermic, TR[facility] was consulted for evaluation -found to have a UTI  Assessment & Plan:   Sepsis due to UTI  -with metabolic encephalopathy/Hypothermia -improving, Temp still on lower side at times, overall trend better -Day 3 of cefepime, culture grew E. coli and Klebsiella, sensitivities noted, will transition to oral Keflex today, continue antibiotics for 4 more days -Due to advanced age and debility also cut down other sedating meds namely Librium to 5 mg twice daily as needed -Continue rehabilitation -Discharge planning  Metabolic encephalopathy -as above, improved  Thrombocytopenia -acute, due to mild sepsis, baseline normal -improving  Chronic diastolic CHF -has 1plus edema, lungs are clear, resume lasix Po  P. atrial fibrillation -Continue Toprol-XL -per Cards not on anticoagulation at baseline due to falls -Diltiazem was held due to bradycardia, monitor  Hypertension -Continue Toprol, hydralazine -BP on higher side, resume diuretics today  DVT prophylaxis: SCDs Code Status: Full Code Family Communication: no family at bedside Disposition Plan: keep in [facility] since improving  Consultants:      Procedures:   Antimicrobials:     Subjective: -feels better  Objective: Vitals:   03/31/19 1958 03/31/19 2222 04/01/19 0407 04/01/19 1418  BP: (!) 176/66  (!) 159/71 (!) 170/68  Pulse: (!) 54  72 72  Resp: 16  16 17   Temp: (!) 93.7 F (34.3 C) (!) 95.8 F (35.4 C) (!) 95.1 F (35.1 C) (!) 97.5 F (36.4 C)  TempSrc:  Axillary  Axillary  SpO2: 95%  90% 98%  Weight:   73.5 kg   Height:        Intake/Output Summary (Last 24 hours) at 04/01/2019 1504 Last data filed at 04/01/2019 1405 Gross per 24 hour  Intake 600 ml  Output -  Net 600 ml   Filed Weights   03/30/19 1957 03/31/19 0526 04/01/19 0407  Weight: 76 kg 75.4 kg 73.5 kg    Examination:  Gen: Elderly female, sitting in a wheelchair, awake alert oriented x3, pleasant HEENT: PERRLA, Neck supple, no JVD Lungs: Clear bilaterally CVS: RRR,No Gallops,Rubs or new Murmurs Abd: soft, Non tender, non distended, BS present Extremities: 1+ edema Skin: no new rashes Psychiatry:.  Appropriate mood and affect    Data Reviewed:   CBC: Recent Labs  Lab 03/29/19 0628 03/29/19 1848 03/30/19 0104 03/31/19 0610 04/01/19 0610  WBC 10.2 7.9 7.3 5.9 7.9  NEUTROABS  --  6.4 5.9  --   --   HGB 10.7* 9.8* 9.7* 9.9* 9.6*  HCT 33.4* 31.5* 31.4* 32.4* 32.3*  MCV 98.8 97.8 98.4 102.2* 100.3*  PLT 90* 92* 88* 91* 99*   Basic Metabolic Panel: Recent Labs  Lab 03/29/19 0628 03/29/19 1848 03/30/19 0104 03/31/19 0610 04/01/19 0610  NA 143 144 146* 147* 143  K 4.0 4.0 3.8 3.7 3.8  CL 101 103 104  106 106  CO2 29 30 29 27 25   GLUCOSE 121* 78 118* 105* 97  BUN 45* 46* 47* 32* 33*  CREATININE 1.17* 1.21* 1.39* 1.24* 1.11*  CALCIUM 9.7 9.5 9.0 9.7 9.5   GFR: Estimated Creatinine Clearance: 37.1 mL/min (A) (by C-G formula based on SCr of 1.11 mg/dL (H)). Liver Function Tests: Recent Labs  Lab 03/29/19 1848 03/31/19 0610  AST 24 16  ALT 30 23  ALKPHOS 95 101  BILITOT 0.9 1.1  PROT 6.3* 6.3*  ALBUMIN 3.1* 2.9*   No results for input(s): LIPASE,  AMYLASE in the last 168 hours. Recent Labs  Lab 03/29/19 1848  AMMONIA 28   Coagulation Profile: No results for input(s): INR, PROTIME in the last 168 hours. Cardiac Enzymes: No results for input(s): CKTOTAL, CKMB, CKMBINDEX, TROPONINI in the last 168 hours. BNP (last 3 results) No results for input(s): PROBNP in the last 8760 hours. HbA1C: No results for input(s): HGBA1C in the last 72 hours. CBG: Recent Labs  Lab 03/29/19 1639  GLUCAP 97   Lipid Profile: No results for input(s): CHOL, HDL, LDLCALC, TRIG, CHOLHDL, LDLDIRECT in the last 72 hours. Thyroid Function Tests: Recent Labs    03/29/19 1848  TSH 5.339*  FREET4 1.00  T3FREE 2.2   Anemia Panel: No results for input(s): VITAMINB12, FOLATE, FERRITIN, TIBC, IRON, RETICCTPCT in the last 72 hours. Urine analysis:    Component Value Date/Time   COLORURINE YELLOW 03/29/2019 1800   APPEARANCEUR CLEAR 03/29/2019 1800   LABSPEC 1.010 03/29/2019 1800   PHURINE 5.0 03/29/2019 1800   GLUCOSEU NEGATIVE 03/29/2019 1800   HGBUR SMALL (A) 03/29/2019 1800   BILIRUBINUR NEGATIVE 03/29/2019 1800   KETONESUR NEGATIVE 03/29/2019 1800   PROTEINUR 30 (A) 03/29/2019 1800   UROBILINOGEN 0.2 09/04/2014 1340   NITRITE POSITIVE (A) 03/29/2019 1800   LEUKOCYTESUR NEGATIVE 03/29/2019 1800   Sepsis Labs: @LABRCNTIP (procalcitonin:4,lacticidven:4)  ) Recent Results (from the past 240 hour(s))  Culture, Urine     Status: Abnormal   Collection Time: 03/29/19  5:40 PM  Result Value Ref Range Status   Specimen Description URINE, CATHETERIZED  Final   Special Requests   Final    NONE Performed at [facility], [place] [address] [place], [place] [address]    Culture (A)  Final    >=100,000 COLONIES/mL KLEBSIELLA PNEUMONIAE >=100,000 COLONIES/mL ESCHERICHIA COLI    Report Status 04/01/2019 FINAL  Final   Organism ID, Bacteria KLEBSIELLA PNEUMONIAE (A)  Final   Organism ID, Bacteria ESCHERICHIA COLI (A)  Final      Susceptibility    Escherichia coli - MIC*    AMPICILLIN 8 SENSITIVE Sensitive     CEFAZOLIN <=4 SENSITIVE Sensitive     CEFTRIAXONE <=1 SENSITIVE Sensitive     CIPROFLOXACIN <=0.25 SENSITIVE Sensitive     GENTAMICIN <=1 SENSITIVE Sensitive     IMIPENEM <=0.25 SENSITIVE Sensitive     NITROFURANTOIN <=16 SENSITIVE Sensitive     TRIMETH/SULFA <=20 SENSITIVE Sensitive     AMPICILLIN/SULBACTAM 4 SENSITIVE Sensitive     PIP/TAZO <=4 SENSITIVE Sensitive     Extended ESBL NEGATIVE Sensitive     * >=100,000 COLONIES/mL ESCHERICHIA COLI   Klebsiella pneumoniae - MIC*    AMPICILLIN RESISTANT Resistant     CEFAZOLIN <=4 SENSITIVE Sensitive     CEFTRIAXONE <=1 SENSITIVE Sensitive     CIPROFLOXACIN <=0.25 SENSITIVE Sensitive     GENTAMICIN <=1 SENSITIVE Sensitive     IMIPENEM 0.5 SENSITIVE Sensitive  NITROFURANTOIN 128 RESISTANT Resistant     TRIMETH/SULFA <=20 SENSITIVE Sensitive     AMPICILLIN/SULBACTAM <=2 SENSITIVE Sensitive     PIP/TAZO <=4 SENSITIVE Sensitive     Extended ESBL NEGATIVE Sensitive     * >=100,000 COLONIES/mL KLEBSIELLA PNEUMONIAE  Culture, blood (routine x 2)     Status: None (Preliminary result)   Collection Time: 03/29/19  6:50 PM  Result Value Ref Range Status   Specimen Description BLOOD LEFT ARM  Final   Special Requests   Final    BOTTLES DRAWN AEROBIC ONLY Blood Culture adequate volume   Culture   Final    NO GROWTH 3 DAYS Performed at [facility], [place] [address], [place], [place] [address]    Report Status PENDING  Incomplete  Culture, blood (routine x 2)     Status: None (Preliminary result)   Collection Time: 03/29/19  7:03 PM  Result Value Ref Range Status   Specimen Description BLOOD RIGHT ARM  Final   Special Requests   Final    BOTTLES DRAWN AEROBIC ONLY Blood Culture adequate volume   Culture   Final    NO GROWTH 3 DAYS Performed at [facility], [address], [place], [place] [address]    Report Status PENDING  Incomplete          Radiology Studies: No results found.      Scheduled Meds: . aspirin EC  81 mg Oral Daily  . FLUoxetine  20 mg Oral Daily  . fluticasone  1 spray Each Nare Daily  . folic acid  1 mg Oral Daily  . furosemide  20 mg Oral Daily  . Gerhardt's butt cream   Topical TID  . hydrALAZINE  75 mg Oral Q8H  . ketotifen  1 drop Both Eyes BID  . levothyroxine  100 mcg Oral Q0600  . metoprolol succinate  75 mg Oral Daily  . pantoprazole  40 mg Oral Daily  . QUEtiapine  25 mg Oral QHS  . saccharomyces boulardii  250 mg Oral BID  . senna  2 tablet Oral QHS  . simvastatin  10 mg Oral QHS  . thiamine  100 mg Oral Daily  . venlafaxine XR  37.5 mg Oral Q breakfast   Continuous Infusions: . ceFEPime (MAXIPIME) IV 2 g (03/31/19 1730)     LOS: 19 days    Time spent: 44min    Domenic Polite, MD [facility]  04/01/2019, 3:04 PM

## 2019-04-01 NOTE — Progress Notes (Signed)
Occupational Therapy Session Note  Patient Details  Name: [name] [name] MRN: [id number] Date of Birth: 1936-10-30  Today's Date: 04/01/2019 OT Individual Time: 1300-1325 OT Individual Time Calculation (min): 25 min    Short Term Goals: Week 2:  OT Short Term Goal 1 (Week 2): STG=LTG due to LOS  Skilled Therapeutic Interventions/Progress Updates:    Pt resting in bed upon arrival but declined OOB activities.  Pt agreeable to sitting EOB. OT intervention with focus on bed mobility, sitting balance, sit<>stad, activity tolerance, and safety awareness.  Pt required min A for all tasks.  Pt performed sit<>stand X 4 with rest breaks between.  Pt returned to bed with all needs within reach and bed alarm activated.   Therapy Documentation Precautions:  Precautions Precautions: Fall Precaution Comments: O2 sats Restrictions Weight Bearing Restrictions: No  Pain: Pain Assessment Pain Scale: 0-10 Pain Score: 0-No pain   Therapy/Group: Individual Therapy  Leroy Libman 04/01/2019, 1:33 PM

## 2019-04-01 NOTE — Progress Notes (Signed)
[facility] PHYSICAL MEDICINE & REHABILITATION PROGRESS NOTE   Subjective/Complaints:  Pt alert. Anxious to go home. "can I go home tomorrow?" denies and SOB. Slept well. Intermittently low temps still  ROS: Patient denies fever, rash, sore throat, blurred vision, nausea, vomiting, diarrhea, cough, shortness of breath or chest pain, joint or back pain, headache, or mood change.    Objective:   Dg Chest 2 View  Result Date: 03/30/2019 CLINICAL DATA:  Fever. EXAM: CHEST - 2 VIEW COMPARISON:  Radiographs of March 29, 2019. FINDINGS: Stable cardiomegaly. Mild central pulmonary vascular congestion is noted. Stable left basilar atelectasis or infiltrate is noted with small pleural effusion. No pneumothorax is noted. Status post surgical repair of old left clavicular fracture. Status post left shoulder arthroplasty. Postsurgical changes are seen involving the lower thoracic spine. IMPRESSION: Stable cardiomegaly with mild central pulmonary vascular congestion stable left basilar subsegmental atelectasis or infiltrate is noted with small left pleural effusion. Electronically Signed   By: Marijo Conception M.D.   On: 03/30/2019 15:15   Mr Brain Wo Contrast  Result Date: 03/30/2019 CLINICAL DATA:  Slurred speech.  Word-finding deficits. EXAM: MRI HEAD WITHOUT CONTRAST TECHNIQUE: Multiplanar, multiecho pulse sequences of the brain and surrounding structures were obtained without intravenous contrast. COMPARISON:  Head CT 03/29/2019 FINDINGS: Brain: There is no evidence of acute infarct, intracranial hemorrhage, mass, midline shift, or extra-axial fluid collection. There is mild cerebral atrophy. T2 hyperintensities in the cerebral white matter and pons have slightly progressed from the prior MRI and are nonspecific but compatible with mild chronic small vessel ischemic disease. Chronic left basal ganglia lacunar infarcts are new from the prior MRI. Vascular: Major intracranial vascular flow voids are  preserved. Skull and upper cervical spine: Unremarkable bone marrow signal. Sinuses/Orbits: Bilateral cataract extraction. Clear paranasal sinuses. Small right mastoid effusion. Other: None. IMPRESSION: 1. No acute intracranial abnormality. 2. Mild chronic small vessel ischemic disease, progressed from 2018. Electronically Signed   By: Logan Bores M.D.   On: 03/30/2019 14:55   Recent Labs    03/31/19 0610 04/01/19 0610  WBC 5.9 7.9  HGB 9.9* 9.6*  HCT 32.4* 32.3*  PLT 91* 99*   Recent Labs    03/31/19 0610 04/01/19 0610  NA 147* 143  K 3.7 3.8  CL 106 106  CO2 27 25  GLUCOSE 105* 97  BUN 32* 33*  CREATININE 1.24* 1.11*  CALCIUM 9.7 9.5    Intake/Output Summary (Last 24 hours) at 04/01/2019 1024 Last data filed at 03/31/2019 1804 Gross per 24 hour  Intake 480 ml  Output -  Net 480 ml     Physical Exam: Vital Signs Blood pressure (!) 159/71, pulse 72, temperature (!) 95.1 F (35.1 C), resp. rate 16, height 5\' 1"  (1.549 m), weight 73.5 kg, SpO2 90 %. Constitutional: No distress . Vital signs reviewed. HEENT: EOMI, oral membranes moist Neck: supple Cardiovascular: RRR without murmur. No JVD    Respiratory: CTA Bilaterally without wheezes or rales. Normal effort    GI: BS +, non-tender, non-distended   Musculoskeletal: Bilateral lower extremity tr to 1+ edema Neurological: oriented to place and person. Follows command. Speech more clear. Still struggling with thin liquids--cough.  Motor:   Bilateral lower extremities grossly 4/5 proximal to distal Ext- 1+ bilateral pre tib edema, stasis dermatitis changes   Skin: Skin iswarmand dry. Psychiatric:bright, cooperative  Assessment/Plan: 1. Functional deficits secondary to rhabdomyolysis, debility which require 3+ hours per day of interdisciplinary therapy in a comprehensive inpatient rehab setting.  Physiatrist is providing close team supervision and 24 hour management of active medical problems listed  below.  Physiatrist and rehab team continue to assess barriers to discharge/monitor patient progress toward functional and medical goals  Care Tool:  Bathing    Body parts bathed by patient: Right arm, Left arm, Chest, Abdomen, Face, Right upper leg, Left upper leg, Front perineal area   Body parts bathed by helper: Right lower leg, Left lower leg, Buttocks     Bathing assist Assist Level: Contact Guard/Touching assist     Upper Body Dressing/Undressing Upper body dressing   What is the patient wearing?: Dress    Upper body assist Assist Level: Set up assist    Lower Body Dressing/Undressing Lower body dressing      What is the patient wearing?: Incontinence brief     Lower body assist Assist for lower body dressing: Minimal Assistance - Patient > 75%     Toileting Toileting    Toileting assist Assist for toileting: Moderate Assistance - Patient 50 - 74%     Transfers Chair/bed transfer  Transfers assist  Chair/bed transfer activity did not occur: Safety/medical concerns  Chair/bed transfer assist level: Moderate Assistance - Patient 50 - 74%     Locomotion Ambulation   Ambulation assist      Assist level: Contact Guard/Touching assist Assistive device: Walker-rolling Max distance: 50   Walk 10 feet activity   Assist     Assist level: Contact Guard/Touching assist Assistive device: Walker-rolling   Walk 50 feet activity   Assist Walk 50 feet with 2 turns activity did not occur: Safety/medical concerns(fatigue )  Assist level: Contact Guard/Touching assist Assistive device: Walker-rolling    Walk 150 feet activity   Assist Walk 150 feet activity did not occur: Safety/medical concerns  Assist level: Contact Guard/Touching assist Assistive device: Walker-rolling    Walk 10 feet on uneven surface  activity   Assist Walk 10 feet on uneven surfaces activity did not occur: Safety/medical concerns          Wheelchair     Assist Will patient use wheelchair at discharge?: No Type of Wheelchair: Manual    Wheelchair assist level: Supervision/Verbal cueing Max wheelchair distance: 150    Wheelchair 50 feet with 2 turns activity    Assist        Assist Level: Supervision/Verbal cueing   Wheelchair 150 feet activity     Assist     Assist Level: Supervision/Verbal cueing    Medical Problem List and Plan: 1.Functional mobility deficitssecondary to rhabdomyolysis, pneumonia, ETOH abuse.    Cont CIR PT, OT, SLP as tolerated   -work toward discharge early this week 2. Antithrombotics: -DVT/anticoagulation:Pharmaceutical:Lovenox -antiplatelet therapy: ASA 3. Pain Management:tylenol prn. 4. Mood:team providing ego support  Neuropsych eval  -baseline anxiety present but seems to be managing fairly well at present -antipsychotic agents:  Seroquel and librium(used librium 10 mg bid for anxiety at home). 5. Neuropsych: This patientis?  Notfully capable of making decisions on herown behalf. 6. Skin/Wound Care:Routine pressure relief measures. 7. Fluids/Electrolytes/Nutrition:encourage PO, eating well as of late   9. AKI with mild rhabdomyolysis:    -BUN/Cr improved to 33/1.1 4/19  -lasix resumed per cards 40mg  daily for 3 days from 4/14 then 20mg  daily starting today    10.Chronic diastolic CHFwith exacerbation:  improved after diuresis   -recent chest x-ray demonstrates improvement Filed Weights   03/30/19 1957 03/31/19 0526 04/01/19 0407  Weight: 76 kg 75.4 kg 73.5 kg    -  weight stable around 80kg  -appreciate cards input   -lasix   20mg  daily    -cardizem stopped 11. H/o anxiety/depression: Lost husband 12/19.Managed by Dr. Vilma Meckel and librium. 12. Abnormal LFTs:normal 4/1 13.Dysphagia: nectar liquids, MBS tomorrow  14.  Constipation    -Improved  -continue probiotic  -sorbitol prn 15.   Essential hypertension/bradycardia  Hydralazine 50 3 times daily, increased to 75 3 times daily on 4/4   Lasix resumed  -cardizem stopped  Continue Toprol 75 daily  -HR  In 70's today 4/19      Vitals:   03/31/19 2222 04/01/19 0407  BP:  (!) 159/71  Pulse:  72  Resp:  16  Temp: (!) 95.8 F (35.4 C) (!) 95.1 F (35.1 C)  SpO2:  90%    16.  Acute blood loss anemia  Hemoglobin  9.6 4/19  Continue to monitor 17.  Thrombocytopenia  Platelets up to 99k 4/19  -have d/c'ed lovenox  18. Altered mental status: improved today, afebrile  -urine culture + for 100k GNR (E coli and Klebsiella)---likely cause of AMS  -MRI negative for stroke  -LIBRIUM   changed to PRN by [facility] although patient has been on this for some time at home.   -continue maxipime pending as UCX sensitivities are STILL pending         LOS: 19 days A FACE TO FACE EVALUATION WAS PERFORMED  Meredith Staggers 04/01/2019, 10:24 AM

## 2019-04-02 ENCOUNTER — Inpatient Hospital Stay (HOSPITAL_COMMUNITY): Payer: Self-pay

## 2019-04-02 ENCOUNTER — Inpatient Hospital Stay (HOSPITAL_COMMUNITY): Payer: Medicare Other

## 2019-04-02 ENCOUNTER — Inpatient Hospital Stay (HOSPITAL_COMMUNITY): Payer: Self-pay | Admitting: Occupational Therapy

## 2019-04-02 ENCOUNTER — Encounter (HOSPITAL_COMMUNITY): Payer: Self-pay | Admitting: Speech Pathology

## 2019-04-02 ENCOUNTER — Inpatient Hospital Stay (HOSPITAL_COMMUNITY): Payer: Self-pay | Admitting: Physical Therapy

## 2019-04-02 LAB — BASIC METABOLIC PANEL
Anion gap: 11 (ref 5–15)
BUN: 34 mg/dL — ABNORMAL HIGH (ref 8–23)
CO2: 28 mmol/L (ref 22–32)
Calcium: 9.7 mg/dL (ref 8.9–10.3)
Chloride: 106 mmol/L (ref 98–111)
Creatinine, Ser: 1.11 mg/dL — ABNORMAL HIGH (ref 0.44–1.00)
GFR calc Af Amer: 54 mL/min — ABNORMAL LOW (ref >60)
GFR calc non Af Amer: 47 mL/min — ABNORMAL LOW (ref >60)
Glucose, Bld: 107 mg/dL — ABNORMAL HIGH (ref 70–99)
Potassium: 3.7 mmol/L (ref 3.5–5.1)
Sodium: 145 mmol/L (ref 135–145)

## 2019-04-02 LAB — CBC
HCT: 32.9 % — ABNORMAL LOW (ref 36.0–46.0)
Hemoglobin: 10.2 g/dL — ABNORMAL LOW (ref 12.0–15.0)
MCH: 30.6 pg (ref 26.0–34.0)
MCHC: 31 g/dL (ref 30.0–36.0)
MCV: 98.8 fL (ref 80.0–100.0)
Platelets: 109 10*3/uL — ABNORMAL LOW (ref 150–400)
RBC: 3.33 MIL/uL — ABNORMAL LOW (ref 3.87–5.11)
RDW: 13 % (ref 11.5–15.5)
WBC: 8.8 10*3/uL (ref 4.0–10.5)
nRBC: 0 % (ref 0.0–0.2)

## 2019-04-02 LAB — GLUCOSE, CAPILLARY: Glucose-Capillary: 107 mg/dL — ABNORMAL HIGH (ref 70–99)

## 2019-04-02 NOTE — Progress Notes (Signed)
Physical Therapy Discharge Summary  Patient Details  Name: [name] [name] MRN: [id number] Date of Birth: Jul 19, 1936  Today's Date: 04/02/2019   Patient has met 6 of 6 long term goals due to improved activity tolerance, improved balance, improved postural control, increased strength and ability to compensate for deficits.  Patient to discharge at an ambulatory level Supervision.   Patient's care partner is independent to provide the necessary physical and cognitive assistance at discharge. Patient's daughter Juliann Pulse came in for hands-on family training last week and demonstrates ability to provide both physical and cognitive assistance for the patient upon d/c home.  Reasons goals not met: Pt has met all rehab goals.   Recommendation:  Patient will benefit from ongoing skilled PT services in home health setting to continue to advance safe functional mobility, address ongoing impairments in balance, endurance, strength, independence with functional mobility, and minimize fall risk.  Equipment: Youth-size RW. Pt already owns manual w/c.   Reasons for discharge: treatment goals met and discharge from hospital  Patient/family agrees with progress made and goals achieved: Yes  PT Discharge Precautions/Restrictions Precautions Precautions: Fall Restrictions Weight Bearing Restrictions: No Vision/Perception  Vision - History Baseline Vision: Wears glasses only for reading Perception Perception: Within Functional Limits Praxis Praxis: Intact  Cognition Overall Cognitive Status: Within Functional Limits for tasks assessed Arousal/Alertness: Awake/alert Orientation Level: Oriented X4 Attention: Sustained Sustained Attention: Appears intact Selective Attention: Appears intact Selective Attention Impairment: Verbal basic;Functional basic Memory: Impaired Memory Impairment: Decreased recall of new information Awareness: Impaired Awareness Impairment: Anticipatory impairment Problem  Solving: Appears intact Safety/Judgment: Appears intact Sensation Sensation Light Touch: Appears Intact Proprioception: Appears Intact Coordination Gross Motor Movements are Fluid and Coordinated: No Fine Motor Movements are Fluid and Coordinated: Yes Coordination and Movement Description: impaired by generalized weakness Motor  Motor Motor: Within Functional Limits Motor - Discharge Observations: Generalized weakness/deconditioning  Mobility Bed Mobility Bed Mobility: Rolling Right;Rolling Left;Supine to Sit;Sit to Supine Rolling Right: Supervision/verbal cueing Rolling Left: Supervision/Verbal cueing Supine to Sit: Supervision/Verbal cueing Sit to Supine: Supervision/Verbal cueing Transfers Transfers: Sit to Stand;Stand to Sit;Stand Pivot Transfers Sit to Stand: Supervision/Verbal cueing Stand to Sit: Supervision/Verbal cueing Stand Pivot Transfers: Supervision/Verbal cueing Stand Pivot Transfer Details: Verbal cues for technique;Verbal cues for precautions/safety;Verbal cues for safe use of DME/AE;Tactile cues for placement Transfer (Assistive device): Rolling walker Locomotion  Gait Ambulation: Yes Gait Assistance: Supervision/Verbal cueing Gait Distance (Feet): 150 Feet Assistive device: Rolling walker Gait Assistance Details: Verbal cues for precautions/safety;Verbal cues for safe use of DME/AE Gait Gait: Yes Gait Pattern: Impaired Gait Pattern: Decreased step length - right;Decreased step length - left Gait velocity: decreased Stairs / Additional Locomotion Stairs: Yes Stairs Assistance: Minimal Assistance - Patient > 75% Stair Management Technique: With walker Number of Stairs: 1 Height of Stairs: 6 Wheelchair Mobility Wheelchair Mobility: Yes Wheelchair Assistance: Chartered loss adjuster: Both upper extremities Wheelchair Parts Management: Needs assistance Distance: 150  Trunk/Postural Assessment  Cervical Assessment Cervical  Assessment: Exceptions to WFL(forward head) Thoracic Assessment Thoracic Assessment: Exceptions to WFL(kyphotic, rounded shoulders) Lumbar Assessment Lumbar Assessment: Exceptions to WFL(posterior pelvic tilt) Postural Control Postural Control: Deficits on evaluation Righting Reactions: delayed; insufficient  Balance Balance Balance Assessed: Yes Static Sitting Balance Static Sitting - Balance Support: No upper extremity supported;Feet supported Static Sitting - Level of Assistance: 7: Independent Dynamic Sitting Balance Dynamic Sitting - Balance Support: No upper extremity supported;Feet supported Dynamic Sitting - Level of Assistance: 5: Stand by assistance;6: Modified independent (Device/Increase time) Static Standing Balance Static Standing -  Balance Support: During functional activity;Right upper extremity supported;Left upper extremity supported Static Standing - Level of Assistance: 5: Stand by assistance Dynamic Standing Balance Dynamic Standing - Balance Support: During functional activity;Right upper extremity supported;Left upper extremity supported Dynamic Standing - Level of Assistance: 5: Stand by assistance Dynamic Standing - Comments: Standing to complete LB clothing management Extremity Assessment  RUE Assessment RUE Assessment: Within Functional Limits Active Range of Motion (AROM) Comments: WFL General Strength Comments: strength grossly 3+/5, able to use WFL LUE Assessment LUE Assessment: Within Functional Limits Active Range of Motion (AROM) Comments: WFL (h/o shoulder surgery) General Strength Comments: grossly 4/5,  RLE Assessment RLE Assessment: Within Functional Limits Passive Range of Motion (PROM) Comments: WFLs General Strength Comments: 4/5 grossly LLE Assessment LLE Assessment: Within Functional Limits Passive Range of Motion (PROM) Comments: WFLs General Strength Comments: 4/5 grossly     Excell Seltzer, PT, DPT 04/02/2019, 2:00 PM

## 2019-04-02 NOTE — Progress Notes (Signed)
Social Work Patient ID: [name] [name], female   DOB: July 11, 1936, 83 y.o.   MRN: [id number]   Pt much better today!  Per MD, he is medically clearing her for d/c tomorrow.  Therapies report she is back to supervision level overall. Have alerted pt and daughter and both very happy with this.  Have also alerted [facility] for need to start [facility] on Wednesday.  [name], LCSW

## 2019-04-02 NOTE — Progress Notes (Signed)
Speech Language Pathology Discharge Summary  Patient Details  Name: [name] [name] MRN: [id number] Date of Birth: 12/10/1936  Today's Date: 04/02/2019 SLP Individual Time: 0935-0945 SLP Individual Time Calculation (min): 10 min   Skilled Therapeutic Interventions:  Skilled treatment session focused on completion of education regarding recent MBS and diet recommendation. All education completed and no further services are indicated by ST.     Patient has met 1 of 1 long term goals.  Patient to discharge at overall Modified Independent level.  Care Partner:  Caregiver Able to Provide Assistance: No  Type of Caregiver Assistance: Physical  Recommendation:  Skilled Nursing facility      Equipment:     Reasons for discharge: Treatment goals met   Patient/Family Agrees with Progress Made and Goals Achieved: Yes      04/02/2019, 9:52 AM    

## 2019-04-02 NOTE — Progress Notes (Signed)
PROGRESS NOTE    [name] [name]  HUT:[id number] DOB: 1936/03/18 DOA: 03/13/2019 PCP: [name], MD  Brief Narrative: 83 year old female with history of CAD, paroxysmal atrial fibrillation not on anticoagulation secondary to multiple falls, history of alcoholism, history of prior strokes, history of chronic DIASTOLIC CHF, anxiety, depression, polypharmacy, hypothyroidism was brought to the hospital after being found down on her porch she was noted to be hypothermic. -Work-up was unremarkable, there was a concern for possible aspiration type event which was felt to be mild and treated with antibiotics -Sequently discharged to CIR for rehabilitation on 3/31 -Had been working with PT, OT and SLP, she was noted to be more drowsy and hypothermic, TRH was consulted for evaluation -found to have a UTI  Assessment & Plan:   Sepsis due to UTI  -with metabolic encephalopathy/Hypothermia -Clinically improving -Urine culture grew E. coli and Klebsiella, sensitivities noted  -status post 3 days of cefepime subsequently transitioned to oral Keflex yesterday, continue this for 3 more days -Due to advanced age and debility also cut down other sedating meds namely Librium to 5 mg twice daily as needed -Continue rehabilitation -Discharge planning  Metabolic encephalopathy -as above, improved  Thrombocytopenia -acute, due to mild sepsis, baseline normal Platelet count is improving  Chronic diastolic CHF -has 1plus edema, lungs are clear, -Continue Lasix 20 mg daily and TED stockings  P. atrial fibrillation -Continue Toprol-XL -per Cards not on anticoagulation at baseline due to falls -Diltiazem was held due to bradycardia, monitor  Hypertension -Continue Toprol, hydralazine -stable  DVT prophylaxis: SCDs Code Status: Full Code Family Communication: no family at bedside Disposition Plan: home soon  Consultants:      Procedures:   Antimicrobials:    Subjective: -Feels much  better today, excited to go home  Objective: Vitals:   04/01/19 0407 04/01/19 1418 04/01/19 1920 04/02/19 0540  BP: (!) 159/71 (!) 170/68 (!) 155/59 (!) 158/64  Pulse: 72 72 74 77  Resp: 16 17 16 16   Temp: (!) 95.1 F (35.1 C) (!) 97.5 F (36.4 C)  (!) 96.7 F (35.9 C)  TempSrc:  Axillary  Axillary  SpO2: 90% 98% 94%   Weight: 73.5 kg     Height:        Intake/Output Summary (Last 24 hours) at 04/02/2019 1539 Last data filed at 04/02/2019 0930 Gross per 24 hour  Intake 200 ml  Output -  Net 200 ml   Filed Weights   03/30/19 1957 03/31/19 0526 04/01/19 0407  Weight: 76 kg 75.4 kg 73.5 kg    Examination:  Gen: Elderly pleasant female, awake, Alert, Oriented X 3, no distress HEENT: PERRLA, Neck supple, no JVD Lungs: Decreased breath sounds at both bases CVS: RRR,No Gallops,Rubs or new Murmurs Abd: soft, Non tender, non distended, BS present Extremities: Trace edema, she has TED stockings on Skin: no new rashes Psychiatry:.  Appropriate mood and affect    Data Reviewed:   CBC: Recent Labs  Lab 03/29/19 1848 03/30/19 0104 03/31/19 0610 04/01/19 0610 04/02/19 0722  WBC 7.9 7.3 5.9 7.9 8.8  NEUTROABS 6.4 5.9  --   --   --   HGB 9.8* 9.7* 9.9* 9.6* 10.2*  HCT 31.5* 31.4* 32.4* 32.3* 32.9*  MCV 97.8 98.4 102.2* 100.3* 98.8  PLT 92* 88* 91* 99* 656*   Basic Metabolic Panel: Recent Labs  Lab 03/29/19 1848 03/30/19 0104 03/31/19 0610 04/01/19 0610 04/02/19 0722  NA 144 146* 147* 143 145  K 4.0 3.8 3.7 3.8 3.7  CL 103 104 106 106 106  CO2 30 29 27 25 28   GLUCOSE 78 118* 105* 97 107*  BUN 46* 47* 32* 33* 34*  CREATININE 1.21* 1.39* 1.24* 1.11* 1.11*  CALCIUM 9.5 9.0 9.7 9.5 9.7   GFR: Estimated Creatinine Clearance: 37.1 mL/min (A) (by C-G formula based on SCr of 1.11 mg/dL (H)). Liver Function Tests: Recent Labs  Lab 03/29/19 1848 03/31/19 0610  AST 24 16  ALT 30 23  ALKPHOS 95 101  BILITOT 0.9 1.1  PROT 6.3* 6.3*  ALBUMIN 3.1* 2.9*   No  results for input(s): LIPASE, AMYLASE in the last 168 hours. Recent Labs  Lab 03/29/19 1848  AMMONIA 28   Coagulation Profile: No results for input(s): INR, PROTIME in the last 168 hours. Cardiac Enzymes: No results for input(s): CKTOTAL, CKMB, CKMBINDEX, TROPONINI in the last 168 hours. BNP (last 3 results) No results for input(s): PROBNP in the last 8760 hours. HbA1C: No results for input(s): HGBA1C in the last 72 hours. CBG: Recent Labs  Lab 03/29/19 1639  GLUCAP 97   Lipid Profile: No results for input(s): CHOL, HDL, LDLCALC, TRIG, CHOLHDL, LDLDIRECT in the last 72 hours. Thyroid Function Tests: No results for input(s): TSH, T4TOTAL, FREET4, T3FREE, THYROIDAB in the last 72 hours. Anemia Panel: No results for input(s): VITAMINB12, FOLATE, FERRITIN, TIBC, IRON, RETICCTPCT in the last 72 hours. Urine analysis:    Component Value Date/Time   COLORURINE YELLOW 03/29/2019 1800   APPEARANCEUR CLEAR 03/29/2019 1800   LABSPEC 1.010 03/29/2019 1800   PHURINE 5.0 03/29/2019 1800   GLUCOSEU NEGATIVE 03/29/2019 1800   HGBUR SMALL (A) 03/29/2019 1800   BILIRUBINUR NEGATIVE 03/29/2019 1800   KETONESUR NEGATIVE 03/29/2019 1800   PROTEINUR 30 (A) 03/29/2019 1800   UROBILINOGEN 0.2 09/04/2014 1340   NITRITE POSITIVE (A) 03/29/2019 1800   LEUKOCYTESUR NEGATIVE 03/29/2019 1800   Sepsis Labs: @LABRCNTIP (procalcitonin:4,lacticidven:4)  ) Recent Results (from the past 240 hour(s))  Culture, Urine     Status: Abnormal   Collection Time: 03/29/19  5:40 PM  Result Value Ref Range Status   Specimen Description URINE, CATHETERIZED  Final   Special Requests   Final    NONE Performed at [facility], [place] [address], [place], [place] [address]    Culture (A)  Final    >=100,000 COLONIES/mL KLEBSIELLA PNEUMONIAE >=100,000 COLONIES/mL ESCHERICHIA COLI    Report Status 04/01/2019 FINAL  Final   Organism ID, Bacteria KLEBSIELLA PNEUMONIAE (A)  Final   Organism ID, Bacteria  ESCHERICHIA COLI (A)  Final      Susceptibility   Escherichia coli - MIC*    AMPICILLIN 8 SENSITIVE Sensitive     CEFAZOLIN <=4 SENSITIVE Sensitive     CEFTRIAXONE <=1 SENSITIVE Sensitive     CIPROFLOXACIN <=0.25 SENSITIVE Sensitive     GENTAMICIN <=1 SENSITIVE Sensitive     IMIPENEM <=0.25 SENSITIVE Sensitive     NITROFURANTOIN <=16 SENSITIVE Sensitive     TRIMETH/SULFA <=20 SENSITIVE Sensitive     AMPICILLIN/SULBACTAM 4 SENSITIVE Sensitive     PIP/TAZO <=4 SENSITIVE Sensitive     Extended ESBL NEGATIVE Sensitive     * >=100,000 COLONIES/mL ESCHERICHIA COLI   Klebsiella pneumoniae - MIC*    AMPICILLIN RESISTANT Resistant     CEFAZOLIN <=4 SENSITIVE Sensitive     CEFTRIAXONE <=1 SENSITIVE Sensitive     CIPROFLOXACIN <=0.25 SENSITIVE Sensitive     GENTAMICIN <=1 SENSITIVE Sensitive     IMIPENEM 0.5 SENSITIVE Sensitive  NITROFURANTOIN 128 RESISTANT Resistant     TRIMETH/SULFA <=20 SENSITIVE Sensitive     AMPICILLIN/SULBACTAM <=2 SENSITIVE Sensitive     PIP/TAZO <=4 SENSITIVE Sensitive     Extended ESBL NEGATIVE Sensitive     * >=100,000 COLONIES/mL KLEBSIELLA PNEUMONIAE  Culture, blood (routine x 2)     Status: None (Preliminary result)   Collection Time: 03/29/19  6:50 PM  Result Value Ref Range Status   Specimen Description BLOOD LEFT ARM  Final   Special Requests   Final    BOTTLES DRAWN AEROBIC ONLY Blood Culture adequate volume   Culture   Final    NO GROWTH 4 DAYS Performed at [facility], [place] [address] [place], [place] [address]    Report Status PENDING  Incomplete  Culture, blood (routine x 2)     Status: None (Preliminary result)   Collection Time: 03/29/19  7:03 PM  Result Value Ref Range Status   Specimen Description BLOOD RIGHT ARM  Final   Special Requests   Final    BOTTLES DRAWN AEROBIC ONLY Blood Culture adequate volume   Culture   Final    NO GROWTH 4 DAYS Performed at [facility], [place] [address], [place], [place] [address]    Report  Status PENDING  Incomplete         Radiology Studies: No results found.      Scheduled Meds: . aspirin EC  81 mg Oral Daily  . cephALEXin  250 mg Oral Q8H  . FLUoxetine  20 mg Oral Daily  . fluticasone  1 spray Each Nare Daily  . folic acid  1 mg Oral Daily  . furosemide  20 mg Oral Daily  . Gerhardt's butt cream   Topical TID  . hydrALAZINE  75 mg Oral Q8H  . ketotifen  1 drop Both Eyes BID  . levothyroxine  100 mcg Oral Q0600  . metoprolol succinate  75 mg Oral Daily  . pantoprazole  40 mg Oral Daily  . QUEtiapine  25 mg Oral QHS  . saccharomyces boulardii  250 mg Oral BID  . senna  2 tablet Oral QHS  . simvastatin  10 mg Oral QHS  . thiamine  100 mg Oral Daily  . venlafaxine XR  37.5 mg Oral Q breakfast   Continuous Infusions:    LOS: 20 days    Time spent: 40min    Domenic Polite, MD [facility]  04/02/2019, 3:39 PM

## 2019-04-02 NOTE — Progress Notes (Signed)
Occupational Therapy Discharge Summary  Patient Details  Name: [name] [name] MRN: [id number] Date of Birth: 11-23-1936  Patient has met 9 of 9 long term goals due to improved activity tolerance, improved balance, postural control, ability to compensate for deficits and improved coordination.  Patient to discharge at overall Supervision level.  Patient's care partner is independent to provide the necessary physical assistance at discharge.  Following medical decline, pt has returned to goal level and is ready for d/c home at supervision level. Pt's daughter has completed hands on family training and reports willing and ableness to provide the supervision assist pt requires. Pt using RW for functional mobility and AE for LB bathing/dressing tasks.   Recommendation:  Patient will benefit from ongoing skilled OT services in home health setting to continue to advance functional skills in the area of BADL, iADL and Reduce care partner burden.  Equipment: Tub transfer bench. Pt has BSC  Reasons for discharge: treatment goals met and discharge from hospital  Patient/family agrees with progress made and goals achieved: Yes  OT Discharge Precautions/Restrictions  Precautions Precautions: Fall Restrictions Weight Bearing Restrictions: No Vision Baseline Vision/History: Wears glasses Wears Glasses: Reading only Patient Visual Report: No change from baseline Vision Assessment?: No apparent visual deficits Perception  Perception: Within Functional Limits Praxis Praxis: Intact Cognition Overall Cognitive Status: Within Functional Limits for tasks assessed Arousal/Alertness: Awake/alert Orientation Level: Oriented X4 Sustained Attention: Appears intact Selective Attention: Appears intact Selective Attention Impairment: Verbal basic;Functional basic Memory: Impaired Memory Impairment: Decreased recall of new information Awareness: Impaired Awareness Impairment: Anticipatory  impairment Problem Solving: Appears intact Safety/Judgment: Appears intact Sensation Sensation Light Touch: Appears Intact Proprioception: Appears Intact Coordination Gross Motor Movements are Fluid and Coordinated: No Fine Motor Movements are Fluid and Coordinated: Yes Coordination and Movement Description: impaired by generalized weakness Motor  Motor Motor: Within Functional Limits Motor - Discharge Observations: Generalized weakness/deconditioning Trunk/Postural Assessment  Cervical Assessment Cervical Assessment: Exceptions to WFL(Forward head) Thoracic Assessment Thoracic Assessment: Exceptions to WFL(Kyphotic; Rounded shoulders) Lumbar Assessment Lumbar Assessment: Exceptions to WFL(Posterior pelvic tilt) Postural Control Postural Control: Deficits on evaluation Righting Reactions: delayed; insufficient  Balance Balance Balance Assessed: Yes Static Sitting Balance Static Sitting - Balance Support: No upper extremity supported;Feet supported Static Sitting - Level of Assistance: 7: Independent Dynamic Sitting Balance Dynamic Sitting - Balance Support: No upper extremity supported;Feet supported Dynamic Sitting - Level of Assistance: 5: Stand by assistance;6: Modified independent (Device/Increase time) Static Standing Balance Static Standing - Balance Support: During functional activity;Right upper extremity supported;Left upper extremity supported Static Standing - Level of Assistance: 5: Stand by assistance Dynamic Standing Balance Dynamic Standing - Balance Support: During functional activity;Right upper extremity supported;Left upper extremity supported Dynamic Standing - Level of Assistance: 5: Stand by assistance Dynamic Standing - Comments: Standing to complete LB clothing management Extremity/Trunk Assessment RUE Assessment RUE Assessment: Within Functional Limits Active Range of Motion (AROM) Comments: WFL General Strength Comments: strength grossly 3+/5,  able to use WFL LUE Assessment LUE Assessment: Within Functional Limits Active Range of Motion (AROM) Comments: WFL (h/o shoulder surgery) General Strength Comments: grossly 4/5,    Emani Taussig L 04/02/2019, 12:13 PM

## 2019-04-02 NOTE — Progress Notes (Signed)
Physical Therapy Session Note  Patient Details  Name: [name] [name] MRN: [id number] Date of Birth: March 12, 1936  Today's Date: 04/02/2019 PT Individual Time: 1330-1425 PT Individual Time Calculation (min): 55 min   Short Term Goals: Week 1:  PT Short Term Goal 1 (Week 1): STGs = LTGs  Skilled Therapeutic Interventions/Progress Updates:    Pt received seated in w/c in room, agreeable to PT session. No complaints of pain. Sit to stand with Supervision to RW throughout session. Car transfer with Supervision and RW. Ascend/descend one curb step with RW and min A, v/c for safe RW management and technique. Ambulation x 150 ft with RW and Supervision. Nustep level 3 x 10 min with use of B UE/LE for global endurance training. Standing balance with RW and Supervision while assisting pt with doffing hospital scrub pants and donning new gown for improved pt comfort. Pt left seated in w/c in room with needs in reach at end of session.  Therapy Documentation Precautions:  Precautions Precautions: Fall Precaution Comments: O2 sats Restrictions Weight Bearing Restrictions: No    Therapy/Group: Individual Therapy   Excell Seltzer, PT, DPT  04/02/2019, 2:27 PM

## 2019-04-02 NOTE — Progress Notes (Signed)
Modified Barium Swallow Progress Note  Patient Details  Name: [name] [name] MRN: [id number] Date of Birth: September 09, 1936  Today's Date: 04/02/2019  Modified Barium Swallow completed.  Full report located under Chart Review in the Imaging Section.  Brief recommendations include the following:  Clinical Impression  Pt presents with functional oropharyngeal abilities as evidenced by swallow initiation at pyriform sinuses with great airway protection throughout trials of thin liquids. Pt with slow transition of barium pill through pharynx with applesauce required to aid in descent. Recommend returning to pt regular diet, thin liquids and medicine whole with puree. ST to sign off at this time.    Swallow Evaluation Recommendations       SLP Diet Recommendations: Regular solids;Thin liquid   Liquid Administration via: Cup;Straw   Medication Administration: Whole meds with puree   Supervision: Patient able to self feed;Intermittent supervision to cue for compensatory strategies   Compensations: Minimize environmental distractions;Slow rate;Small sips/bites   Postural Changes: Seated upright at 90 degrees   Oral Care Recommendations: Oral care BID        Japji Kok 04/02/2019,9:42 AM

## 2019-04-02 NOTE — Progress Notes (Signed)
[facility] PHYSICAL MEDICINE & REHABILITATION PROGRESS NOTE   Subjective/Complaints:  Up in bed. Anxious to get home. Feeling well. For MBS today  ROS: Patient denies fever, rash, sore throat, blurred vision, nausea, vomiting, diarrhea, cough, shortness of breath or chest pain, joint or back pain, headache, or mood change.    Objective:   No results found. Recent Labs    04/01/19 0610 04/02/19 0722  WBC 7.9 8.8  HGB 9.6* 10.2*  HCT 32.3* 32.9*  PLT 99* 109*   Recent Labs    04/01/19 0610 04/02/19 0722  NA 143 145  K 3.8 3.7  CL 106 106  CO2 25 28  GLUCOSE 97 107*  BUN 33* 34*  CREATININE 1.11* 1.11*  CALCIUM 9.5 9.7    Intake/Output Summary (Last 24 hours) at 04/02/2019 0937 Last data filed at 04/01/2019 1843 Gross per 24 hour  Intake 320 ml  Output -  Net 320 ml     Physical Exam: Vital Signs Blood pressure (!) 158/64, pulse 77, temperature (!) 96.7 F (35.9 C), temperature source Axillary, resp. rate 16, height 5\' 1"  (1.549 m), weight 73.5 kg, SpO2 94 %. .Constitutional: No distress . Vital signs reviewed. HEENT: EOMI, oral membranes moist Neck: supple Cardiovascular: RRR without murmur. No JVD    Respiratory: CTA Bilaterally without wheezes or rales. Normal effort    GI: BS +, non-tender, non-distended  Musculoskeletal: Bilateral lower extremity trace Neurological: oriented to place and person. Follows command. Speech more clear. Still struggling with thin liquids--cough.  Motor:   Bilateral lower extremities grossly 4/5 proximal to distal Ext- 1+ bilateral pre tib edema, stasis dermatitis changes stable Skin: Skin iswarmand dry. Psychiatric:bright, cooperative  Assessment/Plan: 1. Functional deficits secondary to rhabdomyolysis, debility which require 3+ hours per day of interdisciplinary therapy in a comprehensive inpatient rehab setting.  Physiatrist is providing close team supervision and 24 hour management of active medical problems  listed below.  Physiatrist and rehab team continue to assess barriers to discharge/monitor patient progress toward functional and medical goals  Care Tool:  Bathing    Body parts bathed by patient: Right arm, Left arm, Chest, Abdomen, Face, Right upper leg, Left upper leg, Front perineal area, Buttocks   Body parts bathed by helper: Right lower leg, Left lower leg     Bathing assist Assist Level: Contact Guard/Touching assist     Upper Body Dressing/Undressing Upper body dressing   What is the patient wearing?: Button up shirt    Upper body assist Assist Level: Contact Guard/Touching assist    Lower Body Dressing/Undressing Lower body dressing      What is the patient wearing?: Pants, Underwear/pull up     Lower body assist Assist for lower body dressing: Supervision/Verbal cueing(Using reacher)     Toileting Toileting    Toileting assist Assist for toileting: Moderate Assistance - Patient 50 - 74%     Transfers Chair/bed transfer  Transfers assist  Chair/bed transfer activity did not occur: Safety/medical concerns  Chair/bed transfer assist level: Moderate Assistance - Patient 50 - 74%     Locomotion Ambulation   Ambulation assist      Assist level: Contact Guard/Touching assist Assistive device: Walker-rolling Max distance: 50   Walk 10 feet activity   Assist     Assist level: Contact Guard/Touching assist Assistive device: Walker-rolling   Walk 50 feet activity   Assist Walk 50 feet with 2 turns activity did not occur: Safety/medical concerns(fatigue )  Assist level: Contact Guard/Touching assist Assistive device: Walker-rolling  Walk 150 feet activity   Assist Walk 150 feet activity did not occur: Safety/medical concerns  Assist level: Contact Guard/Touching assist Assistive device: Walker-rolling    Walk 10 feet on uneven surface  activity   Assist Walk 10 feet on uneven surfaces activity did not occur: Safety/medical  concerns         Wheelchair     Assist Will patient use wheelchair at discharge?: No Type of Wheelchair: Manual    Wheelchair assist level: Supervision/Verbal cueing Max wheelchair distance: 150    Wheelchair 50 feet with 2 turns activity    Assist        Assist Level: Supervision/Verbal cueing   Wheelchair 150 feet activity     Assist     Assist Level: Supervision/Verbal cueing    Medical Problem List and Plan: 1.Functional mobility deficitssecondary to rhabdomyolysis, pneumonia, ETOH abuse.    Cont CIR PT, OT, SLP as tolerated   -potentially discharge tomorrow 2. Antithrombotics: -DVT/anticoagulation:Pharmaceutical:Lovenox -antiplatelet therapy: ASA 3. Pain Management:tylenol prn. 4. Mood:team providing ego support  Neuropsych eval  -baseline anxiety present but seems to be managing fairly well at present -antipsychotic agents:  Seroquel  (used librium 10 mg bid for anxiety at home). 5. Neuropsych: This patientis?  Notfully capable of making decisions on herown behalf. 6. Skin/Wound Care:Routine pressure relief measures. 7. Fluids/Electrolytes/Nutrition:encourage PO, eating well as of late   9. AKI with mild rhabdomyolysis:    -BUN/Cr improved stable at 34/1.11  -continue lasix 20mg  daily    10.Chronic diastolic CHFwith exacerbation:  improved after diuresis   -recent chest x-ray demonstrates improvement Filed Weights   03/30/19 1957 03/31/19 0526 04/01/19 0407  Weight: 76 kg 75.4 kg 73.5 kg    -weight stable around 80kg  -appreciate cards input   -lasix   20mg  daily    -cardizem stopped  -probably will have to live a little "dry" for optimum fluid balance. 11. H/o anxiety/depression: Lost husband 12/19.Managed by Dr. Vilma Meckel and librium. 12. Abnormal LFTs:normal 4/1 13.Dysphagia: nectar liquids, MBS today 14.  Constipation    -Improved  -continue  probiotic  -sorbitol prn 15.  Essential hypertension/bradycardia  Hydralazine 50 3 times daily, increased to 75 3 times daily on 4/4   Lasix resumed  -cardizem stopped  Continue Toprol 75 daily  -HR  In 70's today 4/19      Vitals:   04/01/19 1920 04/02/19 0540  BP:  (!) 158/64  Pulse:  77  Resp: 16 16  Temp:  (!) 96.7 F (35.9 C)  SpO2: 94%     16.  Acute blood loss anemia  Hemoglobin  10.2 4/20  Continue to monitor 17.  Thrombocytopenia  Platelets up to 109 4/20  -likely due to lovenox which was dc'ed 18. Altered mental status: improved today, afebrile  -urine culture + for 100k GNR (E coli and Klebsiella)---likely cause of AMS   -maxipime changed to keflex by TH after sensitivites returned yesterday  -MRI negative for stroke  -LIBRIUM   changed to PRN by [facility] although patient has been on this for some time at home.           LOS: 20 days A FACE TO FACE EVALUATION WAS PERFORMED  Meredith Staggers 04/02/2019, 9:37 AM

## 2019-04-02 NOTE — Progress Notes (Signed)
Occupational Therapy Session Note  Patient Details  Name: [name] [name] MRN: [id number] Date of Birth: [birth year]  Today's Date: 04/02/2019 OT Individual Time: 5027-7412 and 8786-7672 OT Individual Time Calculation (min): 55 min and 40 min   Short Term Goals: Week 2:  OT Short Term Goal 1 (Week 2): STG=LTG due to LOS  Skilled Therapeutic Interventions/Progress Updates:    Session One: Ptseen for OT ADL bathing/dressing session. [name] up in bed upon arrival, awaike andalert,smiling andagreeable to tx session,      denynig pain.  She transferred tositting EOB with supervision using hospitial bed functions. She ambulated short distantances with RW and supervision with VC sforhand placement during   Sit<>stand.   She completed oral care standing atsinik with close supervision. She sat  In w/c at sink to complete  To complete bathing/dressing routine. Mod I UB bathing and assist to button button down shirt. She completed LB bathing with min A to reach feet and close supervision when standing to complete pericare/buttock hygiene. Pt able to recall how to use reacher and donned underwear and pants using reacher and standing at sink to pull pants up. TED hose donned total A and min A for slide on shoes. She completed x2 trials of functional ambulating ~34ft with turn. Seated rest break provided btwn trials and completed at supervision level. Pt returned to room at end of session, left with all needs in reach. Pt voiced multiple times throughout session how much better she was feeling, excited and ready to go home. CSW made aware of pt's progress and approaching goal level.                 Session Two: Pt  Still sitting up in w/c upon arrival, remains in good spirits and agreeable to tx session and denying pain. Pt desiring to "work on leg strengthening" this session. She was taken to therapy gym in w/c total A for time and energy conservation. Completed ambulation in therapy gym with close  supervision using RW. Seated EOM, completed 5x sit>stand without UE support, completed with close supervision. Completed x2 sets of 10 seated knee raises and glute squeezes with multi-modal cuing for proper form and technique. Rest breaks provided throughout.  Zoom ball activity seated EOM for UE strengthening/ROM and core stability.  Standing horseshoe toss standing at RW with supervision to toss horseshoes. Completed x3 trials with seated rest breaks btwn trials. Pt laughing and smiling throughout. She ambulated back to room at end of session using RW. 189 ft total with seated rest break after 3ft then ambulated remainder of way back to room. Pt very proud of this accomplishment. Pt left seated in w/c at end of session, all needs in reach.   Therapy Documentation Precautions:  Precautions Precautions: Fall Precaution Comments: O2 sats Restrictions Weight Bearing Restrictions: No Pain:   No/denies pain   Therapy/Group: Individual Therapy  Jamice Carreno L 04/02/2019, 7:01 AM

## 2019-04-02 NOTE — Progress Notes (Signed)
Physical Therapy Session Note  Patient Details  Name: [name] [name] MRN: [id number] Date of Birth: [birth year]  Today's Date: 04/02/2019 PT Individual Time: 1597-3312 PT Individual Time Calculation (min): 5 min   Short Term Goals: Week 1:  PT Short Term Goal 1 (Week 1): STGs = LTGs PT Short Term Goal 1 - Progress (Week 1): Met PT Short Term Goal 2 (Week 1): Pt will ambulate x 25 ft with LRAD and min A PT Short Term Goal 2 - Progress (Week 1): Met  Skilled Therapeutic Interventions/Progress Updates:    MD in room upon my entry, patient reports scheduled for swallow test at 9 am.  Initiated treatment in room with discussing plan and pt performed sit<>stand x 1 for LE strengthening and endurance traning.  Transport for MBS arrived to take pt for test and treatment terminated.   Therapy Documentation Precautions:  Precautions Precautions: Fall Precaution Comments: O2 sats Restrictions Weight Bearing Restrictions: No General: PT Amount of Missed Time (min): 25 Minutes PT Missed Treatment Reason: Xray(MBS) Pain: Pain Assessment Pain Scale: 0-10 Pain Score: 0-No pain    Therapy/Group: Individual Therapy  Reginia Naas  Magda Kiel, PT 04/02/2019, 8:50 AM

## 2019-04-03 ENCOUNTER — Inpatient Hospital Stay (HOSPITAL_COMMUNITY): Payer: Self-pay | Admitting: Occupational Therapy

## 2019-04-03 LAB — CULTURE, BLOOD (ROUTINE X 2)
Culture: NO GROWTH
Culture: NO GROWTH
Special Requests: ADEQUATE
Special Requests: ADEQUATE

## 2019-04-03 MED ORDER — SIMVASTATIN 10 MG PO TABS: 10.0000 mg | ORAL_TABLET | ORAL | 1 refills | Status: DC

## 2019-04-03 MED ORDER — FUROSEMIDE 20 MG PO TABS: 20.0000 mg | ORAL_TABLET | Freq: Every day | ORAL | 1 refills | Status: DC

## 2019-04-03 MED ORDER — LEVOTHYROXINE SODIUM 100 MCG PO TABS: 100.0000 ug | ORAL_TABLET | Freq: Every day | ORAL | 1 refills | Status: DC

## 2019-04-03 MED ORDER — ZOLPIDEM TARTRATE 5 MG PO TABS: 5.0000 mg | ORAL_TABLET | Freq: Every evening | ORAL | 0 refills | Status: DC | PRN

## 2019-04-03 MED ORDER — FOLIC ACID 1 MG PO TABS: 1.0000 mg | ORAL_TABLET | Freq: Every day | ORAL | 1 refills | Status: DC

## 2019-04-03 MED ORDER — CEPHALEXIN 250 MG PO CAPS: 250.0000 mg | ORAL_CAPSULE | Freq: Three times a day (TID) | ORAL | 0 refills | Status: DC

## 2019-04-03 MED ORDER — HYOSCYAMINE SULFATE ER 0.375 MG PO TB12: 0.3750 mg | ORAL_TABLET | Freq: Two times a day (BID) | ORAL | 0 refills | Status: DC | PRN

## 2019-04-03 MED ORDER — METOPROLOL SUCCINATE ER 25 MG PO TB24: 75.0000 mg | ORAL_TABLET | Freq: Every day | ORAL | 1 refills | Status: DC

## 2019-04-03 MED ORDER — ACETAMINOPHEN 325 MG PO TABS: 325.0000 mg | ORAL_TABLET | ORAL | Status: DC | PRN

## 2019-04-03 MED ORDER — SIMVASTATIN 10 MG PO TABS: 10.0000 mg | ORAL_TABLET | Freq: Every day | ORAL | 1 refills | Status: DC

## 2019-04-03 MED ORDER — KETOTIFEN FUMARATE 0.025 % OP SOLN: 1.0000 [drp] | Freq: Two times a day (BID) | OPHTHALMIC | 0 refills | Status: DC

## 2019-04-03 MED ORDER — FLUOXETINE HCL 20 MG PO CAPS: 20.0000 mg | ORAL_CAPSULE | Freq: Every day | ORAL | 1 refills | Status: DC

## 2019-04-03 MED ORDER — QUETIAPINE FUMARATE 25 MG PO TABS: 25.0000 mg | ORAL_TABLET | Freq: Every day | ORAL | 1 refills | Status: DC

## 2019-04-03 MED ORDER — VENLAFAXINE HCL ER 37.5 MG PO CP24: 37.5000 mg | ORAL_CAPSULE | Freq: Every day | ORAL | 1 refills | Status: DC

## 2019-04-03 MED ORDER — PANTOPRAZOLE SODIUM 40 MG PO TBEC: 40.0000 mg | DELAYED_RELEASE_TABLET | Freq: Every day | ORAL | 1 refills | Status: DC

## 2019-04-03 MED ORDER — FLUTICASONE PROPIONATE 50 MCG/ACT NA SUSP: 1.0000 | Freq: Every day | NASAL | 2 refills | Status: DC

## 2019-04-03 MED ORDER — HYDRALAZINE HCL 25 MG PO TABS: 75.0000 mg | ORAL_TABLET | Freq: Three times a day (TID) | ORAL | 1 refills | Status: DC

## 2019-04-03 MED ORDER — SENNA 8.6 MG PO TABS: 2.0000 | ORAL_TABLET | Freq: Every day | ORAL | 0 refills | Status: DC

## 2019-04-03 MED ORDER — CHLORDIAZEPOXIDE HCL 5 MG PO CAPS: 5.0000 mg | ORAL_CAPSULE | Freq: Two times a day (BID) | ORAL | 0 refills | Status: DC | PRN

## 2019-04-03 MED ORDER — SACCHAROMYCES BOULARDII 250 MG PO CAPS: 250.0000 mg | ORAL_CAPSULE | Freq: Two times a day (BID) | ORAL | Status: DC

## 2019-04-03 NOTE — Progress Notes (Signed)
Discharge to home accompanied by Daughter. Discharge instructions given by Pam PA. Verbalized understanding no questions noted. [place]

## 2019-04-03 NOTE — Progress Notes (Signed)
[facility] PHYSICAL MEDICINE & REHABILITATION PROGRESS NOTE   Subjective/Complaints:  Feeling well. Anxious to go home. No new complaints. Breathing well. No fever  ROS: Patient denies fever, rash, sore throat, blurred vision, nausea, vomiting, diarrhea, cough, shortness of breath or chest pain, joint or back pain, headache, or mood change.    Objective:   No results found. Recent Labs    04/01/19 0610 04/02/19 0722  WBC 7.9 8.8  HGB 9.6* 10.2*  HCT 32.3* 32.9*  PLT 99* 109*   Recent Labs    04/01/19 0610 04/02/19 0722  NA 143 145  K 3.8 3.7  CL 106 106  CO2 25 28  GLUCOSE 97 107*  BUN 33* 34*  CREATININE 1.11* 1.11*  CALCIUM 9.5 9.7    Intake/Output Summary (Last 24 hours) at 04/03/2019 0844 Last data filed at 04/02/2019 1700 Gross per 24 hour  Intake 240 ml  Output -  Net 240 ml     Physical Exam: Vital Signs Blood pressure (!) 158/66, pulse 77, temperature (!) 97.3 F (36.3 C), temperature source Axillary, resp. rate 16, height 5\' 1"  (1.549 m), weight 74 kg, SpO2 92 %. .Constitutional: No distress . Vital signs reviewed. HEENT: EOMI, oral membranes moist Neck: supple Cardiovascular: RRR without murmur. No JVD    Respiratory: CTA Bilaterally without wheezes or rales. Normal effort    GI: BS +, non-tender, non-distended  Musculoskeletal: Bilateral lower extremity trace today Neurological: a and o x 3. Cognitively appropriate Motor:   Bilateral lower extremities grossly 4/5 proximal to distal Ext- 1+ bilateral pre tib edema, stasis dermatitis changes stable Skin: Skin iswarmand dry. Psychiatric:smiling   Assessment/Plan: 1. Functional deficits secondary to rhabdomyolysis, debility which require 3+ hours per day of interdisciplinary therapy in a comprehensive inpatient rehab setting.  Physiatrist is providing close team supervision and 24 hour management of active medical problems listed below.  Physiatrist and rehab team continue to assess  barriers to discharge/monitor patient progress toward functional and medical goals  Care Tool:  Bathing    Body parts bathed by patient: Right arm, Left arm, Chest, Abdomen, Face, Right upper leg, Left upper leg, Front perineal area, Buttocks   Body parts bathed by helper: Right lower leg, Left lower leg     Bathing assist Assist Level: Contact Guard/Touching assist     Upper Body Dressing/Undressing Upper body dressing   What is the patient wearing?: Button up shirt    Upper body assist Assist Level: Contact Guard/Touching assist    Lower Body Dressing/Undressing Lower body dressing      What is the patient wearing?: Pants, Underwear/pull up     Lower body assist Assist for lower body dressing: Supervision/Verbal cueing(Using reacher)     Toileting Toileting    Toileting assist Assist for toileting: Moderate Assistance - Patient 50 - 74%     Transfers Chair/bed transfer  Transfers assist  Chair/bed transfer activity did not occur: Safety/medical concerns  Chair/bed transfer assist level: Supervision/Verbal cueing     Locomotion Ambulation   Ambulation assist      Assist level: Supervision/Verbal cueing Assistive device: Walker-rolling Max distance: 150'   Walk 10 feet activity   Assist     Assist level: Supervision/Verbal cueing Assistive device: Walker-rolling   Walk 50 feet activity   Assist Walk 50 feet with 2 turns activity did not occur: Safety/medical concerns(fatigue )  Assist level: Supervision/Verbal cueing Assistive device: Walker-rolling    Walk 150 feet activity   Assist Walk 150 feet activity did  not occur: Safety/medical concerns  Assist level: Supervision/Verbal cueing Assistive device: Walker-rolling    Walk 10 feet on uneven surface  activity   Assist Walk 10 feet on uneven surfaces activity did not occur: Safety/medical concerns         Wheelchair     Assist Will patient use wheelchair at discharge?:  No Type of Wheelchair: Manual    Wheelchair assist level: Supervision/Verbal cueing Max wheelchair distance: 150    Wheelchair 50 feet with 2 turns activity    Assist        Assist Level: Supervision/Verbal cueing   Wheelchair 150 feet activity     Assist     Assist Level: Supervision/Verbal cueing    Medical Problem List and Plan: 1.Functional mobility deficitssecondary to rhabdomyolysis, pneumonia, ETOH abuse.    Cont CIR PT, OT, SLP as tolerated   -discharge today  -Patient to see Rehab MD/provider in the office for transitional care encounter in 2-4 weeks.   -check CBC/BMET on Monday and send to primary 2. Antithrombotics: -DVT/anticoagulation:Pharmaceutical:Lovenox -antiplatelet therapy: ASA 3. Pain Management:tylenol prn. 4. Mood:team providing ego support  Neuropsych eval  -baseline anxiety present but seems to be managing fairly well at present -antipsychotic agents:  Seroquel  (used librium 10 mg bid for anxiety at home). 5. Neuropsych: This patientis?  Notfully capable of making decisions on herown behalf. 6. Skin/Wound Care:Routine pressure relief measures. 7. Fluids/Electrolytes/Nutrition:encourage PO, eating well as of late   9. AKI with mild rhabdomyolysis:    -BUN/Cr improved stable at 34/1.11  -continue lasix 20mg  daily    10.Chronic diastolic CHFwith exacerbation:  improved after diuresis   -recent chest x-ray demonstrates improvement Filed Weights   03/31/19 0526 04/01/19 0407 04/03/19 0413  Weight: 75.4 kg 73.5 kg 74 kg    -weight stable around 80kg  -appreciate cards input   -lasix   20mg  daily    -cardizem stopped  -probably will have to live a little "dry" for optimum fluid balance. 11. H/o anxiety/depression: Lost husband 12/19.Managed by Dr. Vilma Meckel and librium. 12. Abnormal LFTs:normal 4/1 13.Dysphagia: nectar liquids, MBS today 14.  Constipation     -Improved  -continue probiotic  -sorbitol prn 15.  Essential hypertension/bradycardia  Hydralazine 50 3 times daily, increased to 75 3 times daily on 4/4   Lasix resumed  -cardizem stopped  Continue Toprol 75 daily  -HR  In 70's today 4/19      Vitals:   04/03/19 0413 04/03/19 0600  BP: (!) 171/77 (!) 158/66  Pulse: 77   Resp: 16   Temp: (!) 97.3 F (36.3 C)   SpO2: 92%     16.  Acute blood loss anemia  Hemoglobin  10.2 4/20  Continue to monitor 17.  Thrombocytopenia  Platelets up to 109 4/20  -likely due to lovenox which was dc'ed 18. Altered mental status: improved today, afebrile  -urine culture + for 100k GNR (E coli and Klebsiella)---likely cause of AMS   - continue keflex through Friday  -MRI negative for stroke  -continue librium at home  -discussed health hygiene, substance abuse with patient this morning           LOS: 21 days A FACE TO FACE EVALUATION WAS PERFORMED  Meredith Staggers 04/03/2019, 8:44 AM

## 2019-04-03 NOTE — Discharge Instructions (Signed)
Inpatient Rehab Discharge Instructions  [name] [name] Discharge date and time: 04/03/19   Activities/Precautions/ Functional Status: Activity: no lifting, driving, or strenuous exercise for till cleared by MD Diet: low fat, low cholesterol diet Low salt Wound Care: none needed    Functional status:  ___ No restrictions     ___ Walk up steps independently _X__ 24/7 supervision/assistance   ___ Walk up steps with assistance ___ Intermittent supervision/assistance  ___ Bathe/dress independently ___ Walk with walker     ___ Bathe/dress with assistance ___ Walk Independently    ___ Shower independently _X__ Walk with assistance    _X__ Shower with assistance _X__ No alcohol     ___ Return to work/school ________    COMMUNITY REFERRALS UPON DISCHARGE:    Home Health:   PT     OT     ST    RN                     Agency:  [facility] Phone: [phone]   Medical Equipment/Items Ordered: walker, tub bench                                                     Agency/Supplier:  [facility] @ [phone]    Special Instructions: 1.  Absolutely no alcohol. Do not have any in the home to avoid temptation.    My questions have been answered and I understand these instructions. I will adhere to these goals and the provided educational materials after my discharge from the hospital.  Patient/Caregiver Signature _______________________________ Date __________  Clinician Signature _______________________________________ Date __________  Please bring this form and your medication list with you to all your follow-up doctor's appointments.

## 2019-04-03 NOTE — Progress Notes (Signed)
Patient slept in brief intervals throughout the night. PRN ambien given at hs with minimal effects noted. Temperature 97.3 axillary, reported BP this am 171/77, given scheduled hydralaziine, recheck 158/66. No distress noted. No complaints this am.

## 2019-04-03 NOTE — Progress Notes (Signed)
Social Work  Discharge Note  The overall goal for the admission was met for:   Discharge location: Yes - home with daughter who will provide 24/7 assistance.  Length of Stay: No - LOS extended slightly due to medical issues.  Total LOS = 21 days  Discharge activity level: Yes - supervision overall  Home/community participation: Yes  Services provided included: MD, RD, PT, OT, SLP, RN, TR, Pharmacy and [place]: Medicare and Private Insurance: [facility]  Follow-up services arranged: Home Health: RN, PT, OT, ST via [place], DME: youth rolling walker, tub bench via [place] and Patient/Family has no preference for HH/DME agencies  Comments (or additional information):    Contact info:  Daughter, Jozy Mcphearson @ (H) [phone] or [phone]  Patient/Family verbalized understanding of follow-up arrangements: Yes  Individual responsible for coordination of the follow-up plan: pt/ daughter  Confirmed correct DME delivered: Lennart Pall 04/03/2019    Brandice Busser, [place] Nick

## 2019-04-04 ENCOUNTER — Telehealth (HOSPITAL_COMMUNITY): Payer: Self-pay | Admitting: Physical Medicine and Rehabilitation

## 2019-04-04 NOTE — Telephone Encounter (Signed)
Daughter called regarding patient having diarrhea, frequency due to diuretics as well as rough morning. Stools have been loose, no foul smell and decreased amount as day progressed--she questioned IBS flaring up. Dicussed SE of antibiotics as well as preference to continue this thorough Friday. Advised her to discontinue Senna, take Levsin bid for now, add fiber/probiotic yogurt as well as BRAT diet. To call tomorrow call back if symptoms do not improve.

## 2019-04-06 ENCOUNTER — Encounter: Payer: Medicare Other | Attending: Registered Nurse | Admitting: Registered Nurse

## 2019-04-06 ENCOUNTER — Other Ambulatory Visit: Payer: Self-pay

## 2019-04-06 ENCOUNTER — Encounter: Payer: Self-pay | Admitting: Registered Nurse

## 2019-04-06 DIAGNOSIS — I10 Essential (primary) hypertension: Secondary | ICD-10-CM

## 2019-04-06 DIAGNOSIS — I5032 Chronic diastolic (congestive) heart failure: Secondary | ICD-10-CM

## 2019-04-06 DIAGNOSIS — R5381 Other malaise: Secondary | ICD-10-CM

## 2019-04-06 NOTE — Progress Notes (Signed)
Transitional Care call Transitional Questions Answered by her daughter: Ms. Eudelia Hiltunen  Patient name: VENDETTA PITTINGER DOB: 21-Nov-1936 1. Are you/is patient experiencing any problems since coming home? No a. Are there any questions regarding any aspect of care? No 2. Are there any questions regarding medications administration/dosing? No a. Are meds being taken as prescribed? Yes b. "Patient should review meds with caller to confirm"  3. Have there been any falls? No 4. Has Home Health been to the house and/or have they contacted you? Yes, [place] a. If not, have you tried to contact them? NA b. Can we help you contact them? NA 5. Are bowels and bladder emptying properly? Yes a. Are there any unexpected incontinence issues? No b. If applicable, is patient following bowel/bladder programs? NA 6. Any fevers, problems with breathing, unexpected pain? No 7. Are there any skin problems or new areas of breakdown? No 8. Has the patient/family member arranged specialty MD follow up (ie cardiology/neurology/renal/surgical/etc.)?  HFU appointments are scheduled except for cardiology. She was instructed to call Dr. Burt Knack for [place] appointment, she verbalizes understanding.  a. Can we help arrange? NA 9. Does the patient need any other services or support that we can help arrange? No 10. Are caregivers following through as expected in assisting the patient? Yes 11. Has the patient quit smoking, drinking alcohol, or using drugs as recommended? Ms. Keionna Kinnaird states Ms. Miguel Rota doesn't smoke, drink alcohol or use illicit drugs.   Total time Spent on call: 10 minutes   Appointment date/time 04/24/2019  arrival time 11:45 for 12:00 appointment( Telephone Call) with Dr. Naaman Plummer . At [place]

## 2019-04-10 ENCOUNTER — Telehealth: Payer: Self-pay

## 2019-04-10 DIAGNOSIS — I5032 Chronic diastolic (congestive) heart failure: Secondary | ICD-10-CM | POA: Diagnosis not present

## 2019-04-10 DIAGNOSIS — N390 Urinary tract infection, site not specified: Secondary | ICD-10-CM | POA: Diagnosis not present

## 2019-04-10 DIAGNOSIS — I110 Hypertensive heart disease with heart failure: Secondary | ICD-10-CM | POA: Diagnosis not present

## 2019-04-10 NOTE — Telephone Encounter (Signed)
Erlene Quan OT from [facility] called requesting verbal orders for [place] 2wk4. Orders approved and given per discharge summary.

## 2019-04-11 DIAGNOSIS — I110 Hypertensive heart disease with heart failure: Secondary | ICD-10-CM | POA: Diagnosis not present

## 2019-04-11 DIAGNOSIS — I5032 Chronic diastolic (congestive) heart failure: Secondary | ICD-10-CM | POA: Diagnosis not present

## 2019-04-11 DIAGNOSIS — N390 Urinary tract infection, site not specified: Secondary | ICD-10-CM | POA: Diagnosis not present

## 2019-04-12 ENCOUNTER — Telehealth: Payer: Self-pay | Admitting: *Deleted

## 2019-04-12 DIAGNOSIS — E039 Hypothyroidism, unspecified: Secondary | ICD-10-CM | POA: Diagnosis not present

## 2019-04-12 DIAGNOSIS — F418 Other specified anxiety disorders: Secondary | ICD-10-CM | POA: Diagnosis not present

## 2019-04-12 DIAGNOSIS — N183 Chronic kidney disease, stage 3 (moderate): Secondary | ICD-10-CM | POA: Diagnosis not present

## 2019-04-12 DIAGNOSIS — I10 Essential (primary) hypertension: Secondary | ICD-10-CM | POA: Diagnosis not present

## 2019-04-12 DIAGNOSIS — I5030 Unspecified diastolic (congestive) heart failure: Secondary | ICD-10-CM | POA: Diagnosis not present

## 2019-04-12 DIAGNOSIS — J189 Pneumonia, unspecified organism: Secondary | ICD-10-CM | POA: Diagnosis not present

## 2019-04-12 DIAGNOSIS — N390 Urinary tract infection, site not specified: Secondary | ICD-10-CM | POA: Diagnosis not present

## 2019-04-12 NOTE — Telephone Encounter (Signed)
[name] called for PT POC 2wk2. 1wk2, 1qowk4 to work on gait strength and balance.  Approval given.

## 2019-04-13 ENCOUNTER — Telehealth: Payer: Self-pay

## 2019-04-13 NOTE — Telephone Encounter (Signed)
Ebony Hail, SP from [facility] called stated daughter refused HHST eval stating pt doesn't need ST right she is swallowing fine.

## 2019-04-20 ENCOUNTER — Telehealth: Payer: Self-pay

## 2019-04-20 NOTE — Telephone Encounter (Signed)
error 

## 2019-04-24 ENCOUNTER — Other Ambulatory Visit: Payer: Self-pay

## 2019-04-24 ENCOUNTER — Encounter: Payer: Medicare Other | Attending: Registered Nurse | Admitting: Physical Medicine & Rehabilitation

## 2019-04-24 ENCOUNTER — Encounter: Payer: Self-pay | Admitting: Physical Medicine & Rehabilitation

## 2019-04-24 VITALS — BP 188/102

## 2019-04-24 DIAGNOSIS — R5381 Other malaise: Secondary | ICD-10-CM

## 2019-04-24 DIAGNOSIS — I5032 Chronic diastolic (congestive) heart failure: Secondary | ICD-10-CM

## 2019-04-24 DIAGNOSIS — N390 Urinary tract infection, site not specified: Secondary | ICD-10-CM | POA: Diagnosis not present

## 2019-04-24 DIAGNOSIS — I110 Hypertensive heart disease with heart failure: Secondary | ICD-10-CM | POA: Diagnosis not present

## 2019-04-24 DIAGNOSIS — I10 Essential (primary) hypertension: Secondary | ICD-10-CM

## 2019-04-24 NOTE — Progress Notes (Signed)
Subjective:    Patient ID: [name] [name], female    DOB: Nov 25, 1936, 83 y.o.   MRN: [id number]  HPI   Due to national recommendations of social distancing because of COVID 36, an audio/video tele-health visit is felt to be the most appropriate encounter for this patient at this time. See MyChart message from today for the patient's consent to a tele-health encounter with [place]. This is a follow up telephone visit for the patient who is at home. MD is at office.    I am meeting with the patient today regarding debility and recent inpatient rehab stay.  She has been "shakier" over the last 2 days with a lot of tremors. Her sleep remains an issue. She is taking [place] and seroquel per psych. She is also on effexor and prozac. She has urinary frequency and incontinence at night. Sleep remains an ongoing problem daughter states it's no different than it has been typically however over the last year or so.   She is receiving therapy from [facility]. She is using her walker for balance. She has supervision otherwise from daughter. Daughter reports BP has been elevated as of late but thinks that might be related to dog who is sick and dying.   Daughter reports that swallowing is stable. She has had a reasonable appetite.     Pain Inventory Average Pain 8 Pain Right Now 8 My pain is constant, sharp, stabbing and aching  In the last 24 hours, has pain interfered with the following? General activity 8 Relation with others 8 Enjoyment of life 8 What TIME of day is your pain at its worst? all Sleep (in general) Fair  Pain is worse with: sitting and some activites Pain improves with: medication Relief from Meds: 3  Mobility use a walker how many minutes can you walk? 5 ability to climb steps?  no do you drive?  no  Function disabled: date disabled na I need assistance with the following:  dressing, bathing, toileting, meal prep, household duties and  shopping  Neuro/Psych tingling trouble walking confusion depression anxiety  Prior Studies Any changes since last visit?  no  Physicians involved in your care Any changes since last visit?  no Primary care Dr. [name]   Family History  Problem Relation Age of Onset  . Heart disease Mother   . Cancer Father        lung  . Bipolar disorder Brother    Social History   Socioeconomic History  . Marital status: Married    Spouse name: Not on file  . Number of children: Not on file  . Years of education: Not on file  . Highest education level: Not on file  Occupational History  . Not on file  Social Needs  . Financial resource strain: Not on file  . Food insecurity:    Worry: Not on file    Inability: Not on file  . Transportation needs:    Medical: Not on file    Non-medical: Not on file  Tobacco Use  . Smoking status: Never Smoker  . Smokeless tobacco: Never Used  Substance and Sexual Activity  . Alcohol use: Yes    Alcohol/week: 7.0 standard drinks    Types: 7 Glasses of wine per week    Comment: rare  . Drug use: No    Types: Benzodiazepines  . Sexual activity: Not Currently  Lifestyle  . Physical activity:    Days per week: Not  on file    Minutes per session: Not on file  . Stress: Not on file  Relationships  . Social connections:    Talks on phone: Not on file    Gets together: Not on file    Attends religious service: Not on file    Active member of club or organization: Not on file    Attends meetings of clubs or organizations: Not on file    Relationship status: Not on file  Other Topics Concern  . Not on file  Social History Narrative  . Not on file   Past Surgical History:  Procedure Laterality Date  . ABDOMINAL HYSTERECTOMY     partial  . BACK SURGERY  2010   thoractic-screws and rods  . BILE DUCT STENT PLACEMENT  08/26/2014  . BREAST REDUCTION SURGERY    . CHOLECYSTECTOMY  1988   lap  . COLONOSCOPY    . COLONOSCOPY Left  09/08/2014   Procedure: COLONOSCOPY;  Surgeon: Arta Silence, MD;  Location: [facility] ENDOSCOPY;  Service: Endoscopy;  Laterality: Left;  . ENDARTERECTOMY Left 12/28/2016  . ENDARTERECTOMY Left 12/28/2016   Procedure: ENDARTERECTOMY CAROTID LEFT WITH XENOSURE BIOLOGIC PATCH ANGIOPLASTY;  Surgeon: Angelia Mould, MD;  Location: [place];  Service: Vascular;  Laterality: Left;  . ERCP    . ERCP N/A 08/26/2014   Procedure: ENDOSCOPIC RETROGRADE CHOLANGIOPANCREATOGRAPHY (ERCP);  Surgeon: Jeryl Columbia, MD;  Location: [facility] ENDOSCOPY;  Service: Endoscopy;  Laterality: N/A;  . ERCP N/A 09/11/2014   Procedure: ENDOSCOPIC RETROGRADE CHOLANGIOPANCREATOGRAPHY (ERCP);  Surgeon: Arta Silence, MD;  Location: [facility] ENDOSCOPY;  Service: Endoscopy;  Laterality: N/A;  . EUS N/A 09/11/2014   Procedure: ESOPHAGEAL ENDOSCOPIC ULTRASOUND (EUS) RADIAL;  Surgeon: Arta Silence, MD;  Location: [facility] ENDOSCOPY;  Service: Endoscopy;  Laterality: N/A;  . EYE SURGERY Bilateral    Cataract  . JOINT REPLACEMENT  2005   left shoulder replacement   . ORIF CLAVICULAR FRACTURE Left 07/18/2014   Procedure: OPEN REDUCTION INTERNAL FIXATION (ORIF) LEFT CLAVICULAR FRACTURE;  Surgeon: Ninetta Lights, MD;  Location: [place];  Service: Orthopedics;  Laterality: Left;  . SPYGLASS CHOLANGIOSCOPY N/A 09/11/2014   Procedure: SPYGLASS CHOLANGIOSCOPY;  Surgeon: Arta Silence, MD;  Location: [facility] ENDOSCOPY;  Service: Endoscopy;  Laterality: N/A;  . TONSILLECTOMY  as child  . TOTAL KNEE ARTHROPLASTY Left 03/10/2016   Procedure: LEFT TOTAL KNEE ARTHROPLASTY;  Surgeon: Ninetta Lights, MD;  Location: [place];  Service: Orthopedics;  Laterality: Left;  [name] VENTRAL HERNIA REPAIR  06/08/2012   Procedure: LAPAROSCOPIC VENTRAL HERNIA;  Surgeon: Adin Hector, MD;  Location: [facility] ORS;  Service: General;  Laterality: Right;   Past Medical History:  Diagnosis Date  . Alopecia   . Anemia    hx of years ago   . Anxiety   . ARF (acute renal failure)  ([place]) 08/23/2014  . Arthritis    psoriatic arthritis  . Back pain   . Blood transfusion    at age of 4  . Carotid stenosis 12/2016  . Chronic diastolic CHF (congestive heart failure) ([place])   . Depression   . Dysrhythmia 11/13/2016   PAFib for a short time- Echo done 11/14/16 PAF- to follow up with PCP- to see if she is a candiate for anticoags.  Not started at times time due to alcohol abuse.  [name] GERD (gastroesophageal reflux disease)   . H/O hiatal hernia   . History of acute cholangitis   . History of GI diverticular bleed   .  Hyperlipidemia   . Hypertension   . Hypothyroidism   . IBS (irritable bowel syndrome)   . Pancreatitis   . Peripheral vascular disease ([place])   . PONV (postoperative nausea and vomiting)    with Breast reduction- only time  . Rosacea   . Seizures ([place]) 11/13/2016   Thopught to be from withdrawl Benzodiazepine  . Stroke [facility])    patient denies-  . Thyroid disease    BP (!) 188/102 Comment: pt reported, virtual visit  Opioid Risk Score:   Fall Risk Score:  `1  Depression screen PHQ 2/9  Depression screen PHQ 2/9 04/24/2019  Decreased Interest 1  Down, Depressed, Hopeless 1  PHQ - 2 Score 2    Review of Systems  Constitutional: Positive for unexpected weight change.  HENT: Negative.   Eyes: Negative.   Respiratory: Positive for cough.   Cardiovascular: Negative.   Gastrointestinal: Negative.   Endocrine: Negative.   Genitourinary: Negative.   Musculoskeletal: Positive for neck pain and neck stiffness.  Skin: Negative.   Allergic/Immunologic: Negative.   Neurological: Positive for tremors.  Psychiatric/Behavioral: Positive for confusion and dysphoric mood. The patient is nervous/anxious.   All other systems reviewed and are negative.          Assessment & Plan:  1.Functional mobility deficitssecondary to rhabdomyolysis, pneumonia, ETOH abuse.               -continue HH therapies--->outpt  -needs to remain up and active as possible   -tremor increase likely due to psych or physiological stress 2.  Pain Management:tylenol prn. 3. Anxiety/depression followed by Dr. Toy Care  -effexor and prozac ongoing             -HS seroquel and ambien per psychiatry---might need adjustment/change from [place] to help with hs anxiety  4. AKI with mild rhabdomyolysis:               -recommend follow up labwork. Sounds as if she might be a little dry  -HH coming to house today for draw?                         5.Chronic diastolic CHFwith exacerbation:                per primary, cards  -hold lasix until labwork comes back  6. Altered mental status: typically related to UTI  -follow up ucx ordered  -recent MRI negative for acute changes 7. Urinary incontinence/frequency  -recommend "front loading" her fluid intake to reduce hs trips to bathroom  -rx utis as above  15 minutes of tele-visit time was spent with this patient today. Follow up in 2 months

## 2019-05-02 ENCOUNTER — Ambulatory Visit: Payer: Medicare Other | Admitting: Podiatry

## 2019-05-02 DIAGNOSIS — N390 Urinary tract infection, site not specified: Secondary | ICD-10-CM | POA: Diagnosis not present

## 2019-05-03 ENCOUNTER — Telehealth: Payer: Self-pay

## 2019-05-03 NOTE — Telephone Encounter (Signed)
Nick,PTA from [facility] called stating that pt cancelled visit this week because she was not feeling good. Nurse did go out to check vitals.

## 2019-05-10 DIAGNOSIS — R413 Other amnesia: Secondary | ICD-10-CM | POA: Diagnosis not present

## 2019-05-10 DIAGNOSIS — T7840XA Allergy, unspecified, initial encounter: Secondary | ICD-10-CM | POA: Diagnosis not present

## 2019-05-21 DIAGNOSIS — M26609 Unspecified temporomandibular joint disorder, unspecified side: Secondary | ICD-10-CM | POA: Diagnosis not present

## 2019-05-21 DIAGNOSIS — H9209 Otalgia, unspecified ear: Secondary | ICD-10-CM | POA: Diagnosis not present

## 2019-05-29 ENCOUNTER — Other Ambulatory Visit: Payer: Self-pay

## 2019-05-29 ENCOUNTER — Emergency Department (HOSPITAL_COMMUNITY)
Admission: EM | Admit: 2019-05-29 | Discharge: 2019-05-29 | Disposition: A | Discharge status: Home / Self Care | Payer: Medicare Other | Attending: Emergency Medicine | Admitting: Emergency Medicine

## 2019-05-29 ENCOUNTER — Encounter (HOSPITAL_COMMUNITY): Payer: Self-pay | Admitting: Emergency Medicine

## 2019-05-29 DIAGNOSIS — I5032 Chronic diastolic (congestive) heart failure: Secondary | ICD-10-CM | POA: Diagnosis not present

## 2019-05-29 DIAGNOSIS — Z8673 Personal history of transient ischemic attack (TIA), and cerebral infarction without residual deficits: Secondary | ICD-10-CM | POA: Diagnosis not present

## 2019-05-29 DIAGNOSIS — Z7982 Long term (current) use of aspirin: Secondary | ICD-10-CM | POA: Diagnosis not present

## 2019-05-29 DIAGNOSIS — Z79899 Other long term (current) drug therapy: Secondary | ICD-10-CM | POA: Insufficient documentation

## 2019-05-29 DIAGNOSIS — E039 Hypothyroidism, unspecified: Secondary | ICD-10-CM | POA: Insufficient documentation

## 2019-05-29 DIAGNOSIS — I110 Hypertensive heart disease with heart failure: Secondary | ICD-10-CM | POA: Insufficient documentation

## 2019-05-29 DIAGNOSIS — J019 Acute sinusitis, unspecified: Secondary | ICD-10-CM | POA: Insufficient documentation

## 2019-05-29 DIAGNOSIS — Z96653 Presence of artificial knee joint, bilateral: Secondary | ICD-10-CM | POA: Insufficient documentation

## 2019-05-29 DIAGNOSIS — H9202 Otalgia, left ear: Secondary | ICD-10-CM | POA: Insufficient documentation

## 2019-05-29 MED ORDER — AFRIN NASAL SPRAY 0.05 % NA SOLN: 1.0000 | Freq: Two times a day (BID) | NASAL | 0 refills | Status: AC

## 2019-05-29 MED ORDER — AMOXICILLIN-POT CLAVULANATE 875-125 MG PO TABS: 1.0000 | ORAL_TABLET | Freq: Two times a day (BID) | ORAL | 0 refills | Status: DC

## 2019-05-29 NOTE — ED Triage Notes (Signed)
Pt arrives to the ED with co EAR pain bilateral x 1week. Pt also co blurred vision x1 week. Pt states she fell last week and collapsed. Pt states she uses a walker.

## 2019-05-29 NOTE — ED Provider Notes (Signed)
  Face-to-face evaluation   History: She complains of bilateral ear pain, right greater than left for 1 week.  She also complains of eye drainage and itching.  Denies fever, cough, focal weakness or paresthesia.  Physical exam: Elderly, alert and cooperative.  Lateral eye conjunctivitis with small amount of drainage.  Lids are not swollen.  TMs visualized bilaterally, small amount of blood behind the right TM.  Normal architecture of both TMs.  Normal EACs.  Nontender mastoids.  Medical screening examination/treatment/procedure(s) were conducted as a shared visit with non-physician practitioner(s) and myself.  I personally evaluated the patient during the encounter    Daleen Bo, MD 05/30/19 [phone]

## 2019-05-29 NOTE — ED Provider Notes (Signed)
[facility] EMERGENCY DEPARTMENT Provider Note   CSN: [id number] Arrival date & time: 05/29/19  1121     History   Chief Complaint Chief Complaint  Patient presents with  . Otalgia    HPI [name] [name] is a 83 y.o. female.     HPI   83 year old female presents with a one-week history of bilateral ear pain and hearing loss.  Patient states that symptoms are worse in her left ear than her right ear.  She notes some associated blurry vision which has resolved.  She states she was unable to get in with her primary care provider so she came to the ER for evaluation.  She denies any fevers, chills, cough, rhinorrhea.  She denies any chest pain, shortness of breath, gait difficulty, numbness, tingling.  Denies any head injury or trauma.  Past Medical History:  Diagnosis Date  . Alopecia   . Anemia    hx of years ago   . Anxiety   . ARF (acute renal failure) ([place]) 08/23/2014  . Arthritis    psoriatic arthritis  . Back pain   . Blood transfusion    at age of 4  . Carotid stenosis 12/2016  . Chronic diastolic CHF (congestive heart failure) ([place])   . Depression   . Dysrhythmia 11/13/2016   PAFib for a short time- Echo done 11/14/16 PAF- to follow up with PCP- to see if she is a candiate for anticoags.  Not started at times time due to alcohol abuse.  [name] GERD (gastroesophageal reflux disease)   . H/O hiatal hernia   . History of acute cholangitis   . History of GI diverticular bleed   . Hyperlipidemia   . Hypertension   . Hypothyroidism   . IBS (irritable bowel syndrome)   . Pancreatitis   . Peripheral vascular disease ([place])   . PONV (postoperative nausea and vomiting)    with Breast reduction- only time  . Rosacea   . Seizures ([place]) 11/13/2016   Thopught to be from withdrawl Benzodiazepine  . Stroke [facility])    patient denies-  . Thyroid disease     Patient Active Problem List   Diagnosis Date Noted  . Sepsis ([place]) 03/29/2019  . Bradycardia 03/29/2019   . Somnolence 03/29/2019  . Acute blood loss anemia   . Chronic diastolic congestive heart failure ([place])   . Debility 03/13/2019  . Hypothermia 03/09/2019  . Elevated liver enzymes 03/09/2019  . Thrombocytopenia ([place]) 03/09/2019  . Hyperkalemia 07/23/2017  . AKI (acute kidney injury) ([place]) 07/23/2017  . 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 Fall 06/26/2017  . Intractable nausea and vomiting 03/11/2017  . Acute lower UTI 03/11/2017  . Carotid stenosis 12/28/2016  . Sedative, hypnotic, or anxiolytic withdrawal delirium 11/15/2016  . Paroxysmal atrial fibrillation ([facility])   . Cerebrovascular disease   . Fever   . Acute encephalopathy 11/10/2016  . Hypothyroidism 11/10/2016  . Hypertensive urgency 11/10/2016  . Altered mental state 11/10/2016  . Seizure ([place]) 11/10/2016  . Chronic diastolic CHF (congestive heart failure) ([place])   . Left carotid stenosis 10/29/2016  . S/P total knee replacement 03/10/2016  . 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 Ileus ([place]) 09/05/2014  . Leukocytosis 09/05/2014  . Vomiting 09/04/2014  . Pulmonary edema 09/04/2014  . Hypokalemia 08/28/2014  . Enteritis due to Clostridium difficile 08/27/2014  . 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 Protein calorie malnutrition ([place]) 08/24/2014  . Obstructive  jaundice 08/23/2014  . Acute cholangitis 08/23/2014  . ARF (acute renal failure) ([place]) 08/23/2014  . Coagulopathy ([place]) 08/23/2014  . Back pain   . Arthritis   . Hypertension   . GERD (gastroesophageal reflux disease)   . Anxiety   . IBS (irritable bowel syndrome)   . Hernia of Right posterior flank 04/18/2012    Past Surgical History:  Procedure Laterality Date  . ABDOMINAL HYSTERECTOMY     partial  . BACK SURGERY  2010   thoractic-screws and rods  . BILE DUCT STENT PLACEMENT  08/26/2014  . BREAST REDUCTION SURGERY    . CHOLECYSTECTOMY  1988   lap  . COLONOSCOPY    . COLONOSCOPY Left 09/08/2014    Procedure: COLONOSCOPY;  Surgeon: Arta Silence, MD;  Location[facility] ENDOSCOPY;  Service: Endoscopy;  Laterality: Left;  . ENDARTERECTOMY Left 12/28/2016  . ENDARTERECTOMY Left 12/28/2016   Procedure: ENDARTERECTOMY CAROTID LEFT WITH XENOSURE BIOLOGIC PATCH ANGIOPLASTY;  Surgeon: Angelia Mould, MD;  Location: [facility];  Service: Vascular;  Laterality: Left;  . ERCP    . ERCP N/A 08/26/2014   Procedure: ENDOSCOPIC RETROGRADE CHOLANGIOPANCREATOGRAPHY (ERCP);  Surgeon: Jeryl Columbia, MD;  Location: [facility] ENDOSCOPY;  Service: Endoscopy;  Laterality: N/A;  . ERCP N/A 09/11/2014   Procedure: ENDOSCOPIC RETROGRADE CHOLANGIOPANCREATOGRAPHY (ERCP);  Surgeon: Arta Silence, MD;  Location: [facility] ENDOSCOPY;  Service: Endoscopy;  Laterality: N/A;  . EUS N/A 09/11/2014   Procedure: ESOPHAGEAL ENDOSCOPIC ULTRASOUND (EUS) RADIAL;  Surgeon: Arta Silence, MD;  Location: [facility] ENDOSCOPY;  Service: Endoscopy;  Laterality: N/A;  . EYE SURGERY Bilateral    Cataract  . JOINT REPLACEMENT  2005   left shoulder replacement   . ORIF CLAVICULAR FRACTURE Left 07/18/2014   Procedure: OPEN REDUCTION INTERNAL FIXATION (ORIF) LEFT CLAVICULAR FRACTURE;  Surgeon: Ninetta Lights, MD;  Location: [facility];  Service: Orthopedics;  Laterality: Left;  . SPYGLASS CHOLANGIOSCOPY N/A 09/11/2014   Procedure: SPYGLASS CHOLANGIOSCOPY;  Surgeon: Arta Silence, MD;  Location: [facility] ENDOSCOPY;  Service: Endoscopy;  Laterality: N/A;  . TONSILLECTOMY  as child  . TOTAL KNEE ARTHROPLASTY Left 03/10/2016   Procedure: LEFT TOTAL KNEE ARTHROPLASTY;  Surgeon: Ninetta Lights, MD;  Location: [facility];  Service: Orthopedics;  Laterality: Left;  [name] VENTRAL HERNIA REPAIR  06/08/2012   Procedure: LAPAROSCOPIC VENTRAL HERNIA;  Surgeon: Adin Hector, MD;  Location: [facility] ORS;  Service: General;  Laterality: Right;     OB History   No obstetric history on file.      Home Medications    Prior to Admission medications   Medication Sig Start Date  End Date Taking? Authorizing Provider  acetaminophen (TYLENOL) 325 MG tablet Take 1-2 tablets (325-650 mg total) by mouth every 4 (four) hours as needed for mild pain. 04/03/19   Love, Ivan Anchors, PA-C  aspirin EC 81 MG tablet Take 81 mg by mouth daily.    [provider]  cephALEXin (KEFLEX) 250 MG capsule Take 1 capsule (250 mg total) by mouth every 8 (eight) hours. 04/03/19   Love, Ivan Anchors, PA-C  chlordiazePOXIDE (LIBRIUM) 5 MG capsule Take 1 capsule (5 mg total) by mouth 2 (two) times daily as needed for anxiety. 04/03/19   Love, Ivan Anchors, PA-C  FLUoxetine (PROZAC) 20 MG capsule Take 1 capsule (20 mg total) by mouth daily. 04/03/19   Love, Ivan Anchors, PA-C  fluticasone (FLONASE) 50 MCG/ACT nasal spray Place 1 spray into both nostrils daily. 04/04/19   Bary Leriche, PA-C  folic acid (FOLVITE) 1 MG tablet Take 1 tablet (1 mg total) by mouth daily. 04/03/19   Love, Ivan Anchors, PA-C  furosemide (LASIX) 20 MG tablet Take 1 tablet (20 mg total) by mouth daily. 04/03/19   Love, Ivan Anchors, PA-C  hydrALAZINE (APRESOLINE) 25 MG tablet Take 3 tablets (75 mg total) by mouth every 8 (eight) hours. 04/03/19   Love, Ivan Anchors, PA-C  hyoscyamine (LEVBID) 0.375 MG 12 hr tablet Take 1 tablet (0.375 mg total) by mouth every 12 (twelve) hours as needed for cramping. 04/03/19   Love, Ivan Anchors, PA-C  ketotifen (ZADITOR) 0.025 % ophthalmic solution Place 1 drop into both eyes 2 (two) times daily. 04/03/19   Love, Ivan Anchors, PA-C  levothyroxine (SYNTHROID) 100 MCG tablet Take 1 tablet (100 mcg total) by mouth daily at 6 (six) AM. 04/04/19   Love, Ivan Anchors, PA-C  metoprolol succinate (TOPROL-XL) 25 MG 24 hr tablet Take 3 tablets (75 mg total) by mouth daily. 04/04/19   Love, Ivan Anchors, PA-C  pantoprazole (PROTONIX) 40 MG tablet Take 1 tablet (40 mg total) by mouth daily. Patient not taking: Reported on 04/24/2019 04/04/19   Love, Ivan Anchors, PA-C  QUEtiapine (SEROQUEL) 25 MG tablet Take 1 tablet (25 mg total) by mouth at bedtime.  Patient taking differently: Take 25 mg by mouth at bedtime. 2 50mg  qhs 04/03/19   Love, Ivan Anchors, PA-C  saccharomyces boulardii (FLORASTOR) 250 MG capsule Take 1 capsule (250 mg total) by mouth 2 (two) times daily. 04/03/19   Love, Ivan Anchors, PA-C  senna (SENOKOT) 8.6 MG TABS tablet Take 2 tablets (17.2 mg total) by mouth at bedtime. 04/03/19   Love, Ivan Anchors, PA-C  simvastatin (ZOCOR) 10 MG tablet Take 1 tablet (10 mg total) by mouth daily at 6 PM. 04/03/19   Love, Ivan Anchors, PA-C  thiamine 100 MG tablet Take 1 tablet (100 mg total) by mouth daily. 03/14/19   Thurnell Lose, MD  venlafaxine XR (EFFEXOR-XR) 37.5 MG 24 hr capsule Take 1 capsule (37.5 mg total) by mouth daily with breakfast. 04/04/19   Love, Ivan Anchors, PA-C  zolpidem (AMBIEN) 5 MG tablet Take 1 tablet (5 mg total) by mouth at bedtime as needed for sleep. 04/03/19   Bary Leriche, PA-C    Family History Family History  Problem Relation Age of Onset  . Heart disease Mother   . Cancer Father        lung  . Bipolar disorder Brother     Social History Social History   Tobacco Use  . Smoking status: Never Smoker  . Smokeless tobacco: Never Used  Substance Use Topics  . Alcohol use: Yes    Alcohol/week: 7.0 standard drinks    Types: 7 Glasses of wine per week    Comment: rare  . Drug use: No    Types: Benzodiazepines     Allergies   Tetanus toxoids, Snake antivenin [antivenin crotalidae polyvalent], and Yellow dyes (non-tartrazine)   Review of Systems Review of Systems  Constitutional: Negative for chills and fever.  HENT: Positive for ear pain. Negative for ear discharge, rhinorrhea and sore throat.   Eyes: Positive for visual disturbance.  Respiratory: Negative for cough and shortness of breath.   Cardiovascular: Negative for chest pain.  Gastrointestinal: Negative for abdominal pain, nausea and vomiting.     Physical Exam Updated Vital Signs There were no vitals taken for this visit.  Physical Exam Vitals  signs and nursing note reviewed.  Constitutional:  Appearance: She is well-developed.  HENT:     Head: Normocephalic and atraumatic.     Jaw: There is normal jaw occlusion.     Right Ear: Ear canal and external ear normal. No tenderness. There is no impacted cerumen.     Left Ear: Tympanic membrane, ear canal and external ear normal. No tenderness. There is no impacted cerumen.     Ears:     Comments: Right TM with effusion, appears to be a small amount of blood, good landmarks, not bulging    Nose: Nose normal.     Mouth/Throat:     Lips: Pink.     Mouth: Mucous membranes are dry.  Eyes:     General:        Right eye: Discharge present.        Left eye: Discharge present.    Extraocular Movements: Extraocular movements intact.     Conjunctiva/sclera:     Right eye: Right conjunctiva is injected.     Left eye: Left conjunctiva is injected.     Comments: Yellow, crusty discharge BL eyes  Neck:     Musculoskeletal: Neck supple.  Cardiovascular:     Rate and Rhythm: Normal rate and regular rhythm.     Heart sounds: Normal heart sounds. No murmur.  Pulmonary:     Effort: Pulmonary effort is normal. No respiratory distress.     Breath sounds: Normal breath sounds. No wheezing or rales.  Abdominal:     General: Bowel sounds are normal. There is no distension.     Palpations: Abdomen is soft.     Tenderness: There is no abdominal tenderness.  Musculoskeletal: Normal range of motion.        General: No tenderness or deformity.  Skin:    General: Skin is warm and dry.     Findings: No erythema or rash.  Neurological:     Mental Status: She is alert and oriented to person, place, and time.  Psychiatric:        Behavior: Behavior normal.      ED Treatments / Results  Labs (all labs ordered are listed, but only abnormal results are displayed) Labs Reviewed - No data to display  EKG    Radiology No results found.  Procedures Procedures (including critical care time)   Medications Ordered in ED Medications - No data to display   Initial Impression / Assessment and Plan / ED Course  I have reviewed the triage vital signs and the nursing notes.  Pertinent labs & imaging results that were available during my care of the patient were reviewed by me and considered in my medical decision making (see chart for details).        Presents with right ear pain, left greater than right.  On physical exam her left TM is normal.  Her right TM does have an effusion which is consistent with possible blood.  However given that there is no trauma, this is unlikely a fracture.  Patient has no evidence of head injury, her head is nontender, no swelling no battle sign or raccoon eyes.  Of note her left ear is more painful than her right. She also has some crusting bilateral eyes and conjunctival injection.  Patient seen and evaluated by attending, Dr. Eulis Foster.  He recommends increasing fluid intake, Afrin for 3 days and Augmentin trial.  Encourage close follow-up with primary care.  Patient expressed understanding and is agreeable with plan.   At this time there does not  appear to be any evidence of an acute emergency medical condition and the patient appears stable for discharge with appropriate outpatient follow up.Diagnosis was discussed with patient who verbalizes understanding and is agreeable to discharge. Pt case discussed with Dr. Eulis Foster who agrees with my plan.   Final Clinical Impressions(s) / ED Diagnoses   Final diagnoses:  None    ED Discharge Orders    None       Rachel Moulds 05/29/19 2006    Daleen Bo, MD 05/30/19 [phone]

## 2019-05-29 NOTE — Discharge Instructions (Signed)
Use Afrin spray for 3 days.  Take Augmentin as prescribed.  Follow-up with your primary care provider in 1 week.  Return to the ED immediately for new or worsening symptoms or concerns, such as chest pain, shortness of breath, fevers, vision loss or any concerns at all.

## 2019-05-29 NOTE — ED Notes (Signed)
Patient verbalizes understanding of discharge instructions. Opportunity for questioning and answers were provided. Armband removed by staff, pt discharged from ED.  

## 2019-05-29 NOTE — ED Notes (Signed)
[name] [name]

## 2019-06-03 ENCOUNTER — Emergency Department (HOSPITAL_COMMUNITY): Payer: Medicare Other

## 2019-06-03 ENCOUNTER — Other Ambulatory Visit: Payer: Self-pay

## 2019-06-03 ENCOUNTER — Encounter (HOSPITAL_COMMUNITY): Payer: Self-pay | Admitting: Emergency Medicine

## 2019-06-03 ENCOUNTER — Inpatient Hospital Stay (HOSPITAL_COMMUNITY)
Admission: EM | Admit: 2019-06-03 | Discharge: 2019-06-06 | DRG: 092 | Disposition: A | Discharge status: Home / Self Care | Payer: Medicare Other | Attending: Internal Medicine | Admitting: Internal Medicine

## 2019-06-03 DIAGNOSIS — Z7982 Long term (current) use of aspirin: Secondary | ICD-10-CM | POA: Diagnosis not present

## 2019-06-03 DIAGNOSIS — Z8249 Family history of ischemic heart disease and other diseases of the circulatory system: Secondary | ICD-10-CM

## 2019-06-03 DIAGNOSIS — F329 Major depressive disorder, single episode, unspecified: Secondary | ICD-10-CM | POA: Diagnosis present

## 2019-06-03 DIAGNOSIS — E785 Hyperlipidemia, unspecified: Secondary | ICD-10-CM | POA: Diagnosis present

## 2019-06-03 DIAGNOSIS — E039 Hypothyroidism, unspecified: Secondary | ICD-10-CM | POA: Diagnosis present

## 2019-06-03 DIAGNOSIS — I5032 Chronic diastolic (congestive) heart failure: Secondary | ICD-10-CM | POA: Diagnosis present

## 2019-06-03 DIAGNOSIS — I130 Hypertensive heart and chronic kidney disease with heart failure and stage 1 through stage 4 chronic kidney disease, or unspecified chronic kidney disease: Secondary | ICD-10-CM | POA: Diagnosis present

## 2019-06-03 DIAGNOSIS — Z96652 Presence of left artificial knee joint: Secondary | ICD-10-CM | POA: Diagnosis present

## 2019-06-03 DIAGNOSIS — T50905A Adverse effect of unspecified drugs, medicaments and biological substances, initial encounter: Secondary | ICD-10-CM | POA: Diagnosis present

## 2019-06-03 DIAGNOSIS — Z96612 Presence of left artificial shoulder joint: Secondary | ICD-10-CM | POA: Diagnosis present

## 2019-06-03 DIAGNOSIS — G92 Toxic encephalopathy: Principal | ICD-10-CM | POA: Diagnosis present

## 2019-06-03 DIAGNOSIS — I517 Cardiomegaly: Secondary | ICD-10-CM | POA: Diagnosis not present

## 2019-06-03 DIAGNOSIS — Z8673 Personal history of transient ischemic attack (TIA), and cerebral infarction without residual deficits: Secondary | ICD-10-CM

## 2019-06-03 DIAGNOSIS — L4050 Arthropathic psoriasis, unspecified: Secondary | ICD-10-CM | POA: Diagnosis present

## 2019-06-03 DIAGNOSIS — E876 Hypokalemia: Secondary | ICD-10-CM | POA: Diagnosis not present

## 2019-06-03 DIAGNOSIS — Z9181 History of falling: Secondary | ICD-10-CM

## 2019-06-03 DIAGNOSIS — J9809 Other diseases of bronchus, not elsewhere classified: Secondary | ICD-10-CM | POA: Diagnosis not present

## 2019-06-03 DIAGNOSIS — I480 Paroxysmal atrial fibrillation: Secondary | ICD-10-CM | POA: Diagnosis not present

## 2019-06-03 DIAGNOSIS — F411 Generalized anxiety disorder: Secondary | ICD-10-CM

## 2019-06-03 DIAGNOSIS — Z79899 Other long term (current) drug therapy: Secondary | ICD-10-CM

## 2019-06-03 DIAGNOSIS — R4020 Unspecified coma: Secondary | ICD-10-CM | POA: Diagnosis not present

## 2019-06-03 DIAGNOSIS — Z801 Family history of malignant neoplasm of trachea, bronchus and lung: Secondary | ICD-10-CM

## 2019-06-03 DIAGNOSIS — Z1159 Encounter for screening for other viral diseases: Secondary | ICD-10-CM

## 2019-06-03 DIAGNOSIS — N183 Chronic kidney disease, stage 3 (moderate): Secondary | ICD-10-CM | POA: Diagnosis present

## 2019-06-03 DIAGNOSIS — Z66 Do not resuscitate: Secondary | ICD-10-CM | POA: Diagnosis present

## 2019-06-03 DIAGNOSIS — I2510 Atherosclerotic heart disease of native coronary artery without angina pectoris: Secondary | ICD-10-CM | POA: Diagnosis present

## 2019-06-03 DIAGNOSIS — Z9049 Acquired absence of other specified parts of digestive tract: Secondary | ICD-10-CM

## 2019-06-03 DIAGNOSIS — Z79890 Hormone replacement therapy (postmenopausal): Secondary | ICD-10-CM

## 2019-06-03 DIAGNOSIS — R410 Disorientation, unspecified: Secondary | ICD-10-CM

## 2019-06-03 DIAGNOSIS — R443 Hallucinations, unspecified: Secondary | ICD-10-CM | POA: Diagnosis present

## 2019-06-03 DIAGNOSIS — R531 Weakness: Secondary | ICD-10-CM

## 2019-06-03 DIAGNOSIS — Z90710 Acquired absence of both cervix and uterus: Secondary | ICD-10-CM

## 2019-06-03 DIAGNOSIS — K219 Gastro-esophageal reflux disease without esophagitis: Secondary | ICD-10-CM | POA: Diagnosis present

## 2019-06-03 DIAGNOSIS — I10 Essential (primary) hypertension: Secondary | ICD-10-CM | POA: Diagnosis not present

## 2019-06-03 DIAGNOSIS — Y92009 Unspecified place in unspecified non-institutional (private) residence as the place of occurrence of the external cause: Secondary | ICD-10-CM | POA: Diagnosis not present

## 2019-06-03 DIAGNOSIS — Z515 Encounter for palliative care: Secondary | ICD-10-CM

## 2019-06-03 DIAGNOSIS — Z887 Allergy status to serum and vaccine status: Secondary | ICD-10-CM

## 2019-06-03 DIAGNOSIS — Z91048 Other nonmedicinal substance allergy status: Secondary | ICD-10-CM

## 2019-06-03 DIAGNOSIS — H748X1 Other specified disorders of right middle ear and mastoid: Secondary | ICD-10-CM | POA: Diagnosis not present

## 2019-06-03 DIAGNOSIS — I739 Peripheral vascular disease, unspecified: Secondary | ICD-10-CM | POA: Diagnosis present

## 2019-06-03 DIAGNOSIS — G9341 Metabolic encephalopathy: Secondary | ICD-10-CM | POA: Diagnosis present

## 2019-06-03 DIAGNOSIS — H7091 Unspecified mastoiditis, right ear: Secondary | ICD-10-CM | POA: Diagnosis present

## 2019-06-03 DIAGNOSIS — R451 Restlessness and agitation: Secondary | ICD-10-CM

## 2019-06-03 DIAGNOSIS — Z7189 Other specified counseling: Secondary | ICD-10-CM

## 2019-06-03 DIAGNOSIS — Z03818 Encounter for observation for suspected exposure to other biological agents ruled out: Secondary | ICD-10-CM | POA: Diagnosis not present

## 2019-06-03 DIAGNOSIS — F32A Depression, unspecified: Secondary | ICD-10-CM

## 2019-06-03 DIAGNOSIS — R4182 Altered mental status, unspecified: Secondary | ICD-10-CM | POA: Diagnosis not present

## 2019-06-03 DIAGNOSIS — F419 Anxiety disorder, unspecified: Secondary | ICD-10-CM | POA: Diagnosis present

## 2019-06-03 DIAGNOSIS — Z87440 Personal history of urinary (tract) infections: Secondary | ICD-10-CM

## 2019-06-03 DIAGNOSIS — R404 Transient alteration of awareness: Secondary | ICD-10-CM | POA: Diagnosis not present

## 2019-06-03 LAB — CBC
HCT: 34.1 % — ABNORMAL LOW (ref 36.0–46.0)
Hemoglobin: 10.5 g/dL — ABNORMAL LOW (ref 12.0–15.0)
MCH: 29.7 pg (ref 26.0–34.0)
MCHC: 30.8 g/dL (ref 30.0–36.0)
MCV: 96.6 fL (ref 80.0–100.0)
Platelets: 106 10*3/uL — ABNORMAL LOW (ref 150–400)
RBC: 3.53 MIL/uL — ABNORMAL LOW (ref 3.87–5.11)
RDW: 13.4 % (ref 11.5–15.5)
WBC: 8.1 10*3/uL (ref 4.0–10.5)
nRBC: 0.5 % — ABNORMAL HIGH (ref 0.0–0.2)

## 2019-06-03 LAB — CBC WITH DIFFERENTIAL/PLATELET
Abs Immature Granulocytes: 0.03 10*3/uL (ref 0.00–0.07)
Basophils Absolute: 0 10*3/uL (ref 0.0–0.1)
Basophils Relative: 0 %
Eosinophils Absolute: 0.1 10*3/uL (ref 0.0–0.5)
Eosinophils Relative: 2 %
HCT: 34.3 % — ABNORMAL LOW (ref 36.0–46.0)
Hemoglobin: 11 g/dL — ABNORMAL LOW (ref 12.0–15.0)
Immature Granulocytes: 0 %
Lymphocytes Relative: 14 %
Lymphs Abs: 1.1 10*3/uL (ref 0.7–4.0)
MCH: 30.1 pg (ref 26.0–34.0)
MCHC: 32.1 g/dL (ref 30.0–36.0)
MCV: 94 fL (ref 80.0–100.0)
Monocytes Absolute: 1 10*3/uL (ref 0.1–1.0)
Monocytes Relative: 12 %
Neutro Abs: 5.7 10*3/uL (ref 1.7–7.7)
Neutrophils Relative %: 72 %
Platelets: 135 10*3/uL — ABNORMAL LOW (ref 150–400)
RBC: 3.65 MIL/uL — ABNORMAL LOW (ref 3.87–5.11)
RDW: 13.2 % (ref 11.5–15.5)
WBC: 8 10*3/uL (ref 4.0–10.5)
nRBC: 0 % (ref 0.0–0.2)

## 2019-06-03 LAB — COMPREHENSIVE METABOLIC PANEL
ALT: 25 U/L (ref 0–44)
AST: 24 U/L (ref 15–41)
Albumin: 3.1 g/dL — ABNORMAL LOW (ref 3.5–5.0)
Alkaline Phosphatase: 87 U/L (ref 38–126)
Anion gap: 11 (ref 5–15)
BUN: 30 mg/dL — ABNORMAL HIGH (ref 8–23)
CO2: 24 mmol/L (ref 22–32)
Calcium: 8.7 mg/dL — ABNORMAL LOW (ref 8.9–10.3)
Chloride: 104 mmol/L (ref 98–111)
Creatinine, Ser: 1.18 mg/dL — ABNORMAL HIGH (ref 0.44–1.00)
GFR calc Af Amer: 50 mL/min — ABNORMAL LOW (ref >60)
GFR calc non Af Amer: 44 mL/min — ABNORMAL LOW (ref >60)
Glucose, Bld: 111 mg/dL — ABNORMAL HIGH (ref 70–99)
Potassium: 3.9 mmol/L (ref 3.5–5.1)
Sodium: 139 mmol/L (ref 135–145)
Total Bilirubin: 0.4 mg/dL (ref 0.3–1.2)
Total Protein: 6 g/dL — ABNORMAL LOW (ref 6.5–8.1)

## 2019-06-03 LAB — AMMONIA: Ammonia: 36 umol/L — ABNORMAL HIGH (ref 9–35)

## 2019-06-03 LAB — RAPID URINE DRUG SCREEN, HOSP PERFORMED
Amphetamines: NOT DETECTED
Barbiturates: NOT DETECTED
Benzodiazepines: POSITIVE — AB
Cocaine: NOT DETECTED
Opiates: NOT DETECTED
Tetrahydrocannabinol: NOT DETECTED

## 2019-06-03 LAB — URINALYSIS, COMPLETE (UACMP) WITH MICROSCOPIC
Bacteria, UA: NONE SEEN
Bilirubin Urine: NEGATIVE
Glucose, UA: NEGATIVE mg/dL
Hgb urine dipstick: NEGATIVE
Ketones, ur: NEGATIVE mg/dL
Leukocytes,Ua: NEGATIVE
Nitrite: NEGATIVE
Protein, ur: NEGATIVE mg/dL
Specific Gravity, Urine: 1.005 (ref 1.005–1.030)
pH: 5 (ref 5.0–8.0)

## 2019-06-03 LAB — ETHANOL: Alcohol, Ethyl (B): <10 mg/dL (ref <10)

## 2019-06-03 LAB — CREATININE, SERUM
Creatinine, Ser: 1.1 mg/dL — ABNORMAL HIGH (ref 0.44–1.00)
GFR calc Af Amer: 55 mL/min — ABNORMAL LOW (ref >60)
GFR calc non Af Amer: 47 mL/min — ABNORMAL LOW (ref >60)

## 2019-06-03 LAB — CBG MONITORING, ED: Glucose-Capillary: 102 mg/dL — ABNORMAL HIGH (ref 70–99)

## 2019-06-03 LAB — ACETAMINOPHEN LEVEL: Acetaminophen (Tylenol), Serum: <10 ug/mL — ABNORMAL LOW (ref 10–30)

## 2019-06-03 LAB — LACTIC ACID, PLASMA: Lactic Acid, Venous: 0.9 mmol/L (ref 0.5–1.9)

## 2019-06-03 LAB — SALICYLATE LEVEL: Salicylate Lvl: <7 mg/dL (ref 2.8–30.0)

## 2019-06-03 MED ORDER — HYDRALAZINE HCL 50 MG PO TABS
75.0000 mg | ORAL_TABLET | Freq: Three times a day (TID) | ORAL | Status: DC
  Administered 2019-06-03 – 2019-06-06 (×10): 75 mg via ORAL
  Filled 2019-06-03 (×3): qty 1
  Filled 2019-06-03: qty 3
  Filled 2019-06-03 (×6): qty 1

## 2019-06-03 MED ORDER — ONDANSETRON HCL 4 MG PO TABS: 4.0000 mg | ORAL_TABLET | Freq: Four times a day (QID) | ORAL | Status: DC | PRN

## 2019-06-03 MED ORDER — FLUOXETINE HCL 20 MG PO CAPS
20.0000 mg | ORAL_CAPSULE | Freq: Every day | ORAL | Status: DC
  Administered 2019-06-04 – 2019-06-06 (×3): 20 mg via ORAL
  Filled 2019-06-03 (×3): qty 1

## 2019-06-03 MED ORDER — ZIPRASIDONE MESYLATE 20 MG IM SOLR
10.0000 mg | Freq: Once | INTRAMUSCULAR | Status: AC
  Administered 2019-06-03: 10 mg via INTRAMUSCULAR
  Filled 2019-06-03: qty 20

## 2019-06-03 MED ORDER — QUETIAPINE FUMARATE 100 MG PO TABS: 100.0000 mg | ORAL_TABLET | Freq: Two times a day (BID) | ORAL | Status: DC

## 2019-06-03 MED ORDER — METOPROLOL SUCCINATE ER 25 MG PO TB24: 50.0000 mg | ORAL_TABLET | Freq: Every day | ORAL | Status: DC

## 2019-06-03 MED ORDER — LEVOTHYROXINE SODIUM 100 MCG/5ML IV SOLN
25.0000 ug | Freq: Every day | INTRAVENOUS | Status: DC
  Administered 2019-06-04: 25 ug via INTRAVENOUS
  Filled 2019-06-03: qty 5

## 2019-06-03 MED ORDER — SODIUM CHLORIDE 0.9 % IV SOLN
INTRAVENOUS | Status: DC
  Administered 2019-06-03 – 2019-06-05 (×3): via INTRAVENOUS

## 2019-06-03 MED ORDER — HALOPERIDOL LACTATE 5 MG/ML IJ SOLN: 5.0000 mg | Freq: Four times a day (QID) | INTRAMUSCULAR | Status: DC | PRN

## 2019-06-03 MED ORDER — KETOTIFEN FUMARATE 0.025 % OP SOLN
1.0000 [drp] | Freq: Two times a day (BID) | OPHTHALMIC | Status: DC
  Administered 2019-06-03 – 2019-06-06 (×6): 1 [drp] via OPHTHALMIC
  Filled 2019-06-03: qty 5

## 2019-06-03 MED ORDER — FOLIC ACID 1 MG PO TABS
1.0000 mg | ORAL_TABLET | Freq: Every day | ORAL | Status: DC
  Administered 2019-06-04 – 2019-06-06 (×3): 1 mg via ORAL
  Filled 2019-06-03 (×3): qty 1

## 2019-06-03 MED ORDER — ACETAMINOPHEN 325 MG PO TABS
650.0000 mg | ORAL_TABLET | Freq: Four times a day (QID) | ORAL | Status: DC | PRN
  Administered 2019-06-03: 650 mg via ORAL
  Filled 2019-06-03 (×2): qty 2

## 2019-06-03 MED ORDER — LEVOTHYROXINE SODIUM 100 MCG/5ML IV SOLN: 25.0000 ug | Freq: Every day | INTRAVENOUS | Status: DC

## 2019-06-03 MED ORDER — ACETAMINOPHEN 650 MG RE SUPP: 650.0000 mg | Freq: Four times a day (QID) | RECTAL | Status: DC | PRN

## 2019-06-03 MED ORDER — LORAZEPAM 2 MG/ML IJ SOLN
2.0000 mg | Freq: Once | INTRAMUSCULAR | Status: AC
  Administered 2019-06-03: 06:00:00 2 mg via INTRAVENOUS
  Filled 2019-06-03: qty 1

## 2019-06-03 MED ORDER — ONDANSETRON HCL 4 MG/2ML IJ SOLN: 4.0000 mg | Freq: Four times a day (QID) | INTRAMUSCULAR | Status: DC | PRN

## 2019-06-03 MED ORDER — SIMVASTATIN 20 MG PO TABS
10.0000 mg | ORAL_TABLET | Freq: Every day | ORAL | Status: DC
  Administered 2019-06-03 – 2019-06-05 (×3): 10 mg via ORAL
  Filled 2019-06-03 (×3): qty 1

## 2019-06-03 MED ORDER — LORAZEPAM 2 MG/ML IJ SOLN
1.0000 mg | Freq: Once | INTRAMUSCULAR | Status: AC
  Administered 2019-06-03: 1 mg via INTRAVENOUS
  Filled 2019-06-03: qty 1

## 2019-06-03 MED ORDER — PANTOPRAZOLE SODIUM 40 MG PO TBEC: 40.0000 mg | DELAYED_RELEASE_TABLET | Freq: Every day | ORAL | Status: DC

## 2019-06-03 MED ORDER — ASPIRIN EC 81 MG PO TBEC
81.0000 mg | DELAYED_RELEASE_TABLET | Freq: Every day | ORAL | Status: DC
  Administered 2019-06-04 – 2019-06-06 (×3): 81 mg via ORAL
  Filled 2019-06-03 (×3): qty 1

## 2019-06-03 MED ORDER — METOPROLOL TARTRATE 5 MG/5ML IV SOLN
2.5000 mg | Freq: Three times a day (TID) | INTRAVENOUS | Status: DC
  Administered 2019-06-03 – 2019-06-04 (×3): 2.5 mg via INTRAVENOUS
  Filled 2019-06-03 (×3): qty 5

## 2019-06-03 MED ORDER — QUETIAPINE FUMARATE 25 MG PO TABS: 100.0000 mg | ORAL_TABLET | Freq: Every day | ORAL | Status: DC

## 2019-06-03 MED ORDER — ACETAMINOPHEN 325 MG PO TABS: 325.0000 mg | ORAL_TABLET | ORAL | Status: DC | PRN

## 2019-06-03 MED ORDER — ENOXAPARIN SODIUM 40 MG/0.4ML ~~LOC~~ SOLN
40.0000 mg | SUBCUTANEOUS | Status: DC
  Administered 2019-06-04 – 2019-06-06 (×3): 40 mg via SUBCUTANEOUS
  Filled 2019-06-03 (×3): qty 0.4

## 2019-06-03 MED ORDER — VENLAFAXINE HCL ER 37.5 MG PO CP24
37.5000 mg | ORAL_CAPSULE | Freq: Every day | ORAL | Status: DC
  Administered 2019-06-04 – 2019-06-06 (×3): 37.5 mg via ORAL
  Filled 2019-06-03 (×3): qty 1

## 2019-06-03 MED ORDER — SENNA 8.6 MG PO TABS: 2.0000 | ORAL_TABLET | Freq: Every day | ORAL | Status: DC

## 2019-06-03 MED ORDER — HALOPERIDOL LACTATE 5 MG/ML IJ SOLN: 2.0000 mg | Freq: Four times a day (QID) | INTRAMUSCULAR | Status: DC | PRN

## 2019-06-03 MED ORDER — SACCHAROMYCES BOULARDII 250 MG PO CAPS: 250.0000 mg | ORAL_CAPSULE | Freq: Two times a day (BID) | ORAL | Status: DC

## 2019-06-03 MED ORDER — FLUTICASONE PROPIONATE 50 MCG/ACT NA SUSP
1.0000 | Freq: Every day | NASAL | Status: DC
  Administered 2019-06-04 – 2019-06-05 (×2): 1 via NASAL
  Filled 2019-06-03: qty 16

## 2019-06-03 MED ORDER — QUETIAPINE FUMARATE 25 MG PO TABS
100.0000 mg | ORAL_TABLET | Freq: Every day | ORAL | Status: DC
  Administered 2019-06-03 – 2019-06-05 (×3): 100 mg via ORAL
  Filled 2019-06-03 (×3): qty 4

## 2019-06-03 MED ORDER — VITAMIN B-1 100 MG PO TABS
100.0000 mg | ORAL_TABLET | Freq: Every day | ORAL | Status: DC
  Administered 2019-06-04 – 2019-06-06 (×3): 100 mg via ORAL
  Filled 2019-06-03 (×3): qty 1

## 2019-06-03 NOTE — ED Provider Notes (Signed)
[facility] EMERGENCY DEPARTMENT Provider Note   CSN: [id number] Arrival date & time: 06/03/19  0408     History   Chief Complaint Chief Complaint  Patient presents with  . Altered Mental Status   Level 5 caveat due to altered mental status HPI [name] [name] is a 83 y.o. female.     The history is provided by the EMS personnel and the patient. The history is limited by the condition of the patient.  Altered Mental Status Presenting symptoms: confusion   Severity:  Severe Most recent episode:  Today Episode history:  Single Timing:  Constant Progression:  Worsening Chronicity:  New Patient with history of CHF, depression, hypertension presents with altered mental status.  Per EMS, daughter reported at midnight the patient appeared altered.  Patient was recently taken off of Seroquel.  No other details known at this time.  Past Medical History:  Diagnosis Date  . Alopecia   . Anemia    hx of years ago   . Anxiety   . ARF (acute renal failure) ([place]) 08/23/2014  . Arthritis    psoriatic arthritis  . Back pain   . Blood transfusion    at age of 4  . Carotid stenosis 12/2016  . Chronic diastolic CHF (congestive heart failure) ([place])   . Depression   . Dysrhythmia 11/13/2016   PAFib for a short time- Echo done 11/14/16 PAF- to follow up with PCP- to see if she is a candiate for anticoags.  Not started at times time due to alcohol abuse.  [name] GERD (gastroesophageal reflux disease)   . H/O hiatal hernia   . History of acute cholangitis   . History of GI diverticular bleed   . Hyperlipidemia   . Hypertension   . Hypothyroidism   . IBS (irritable bowel syndrome)   . Pancreatitis   . Peripheral vascular disease ([place])   . PONV (postoperative nausea and vomiting)    with Breast reduction- only time  . Rosacea   . Seizures ([place]) 11/13/2016   Thopught to be from withdrawl Benzodiazepine  . Stroke [facility])    patient denies-  . Thyroid disease     Patient  Active Problem List   Diagnosis Date Noted  . Sepsis ([place]) 03/29/2019  . Bradycardia 03/29/2019  . Somnolence 03/29/2019  . Acute blood loss anemia   . Chronic diastolic congestive heart failure ([place])   . Debility 03/13/2019  . Hypothermia 03/09/2019  . Elevated liver enzymes 03/09/2019  . Thrombocytopenia ([place]) 03/09/2019  . Hyperkalemia 07/23/2017  . AKI (acute kidney injury) ([place]) 07/23/2017  . 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 Fall 06/26/2017  . Intractable nausea and vomiting 03/11/2017  . Acute lower UTI 03/11/2017  . Carotid stenosis 12/28/2016  . Sedative, hypnotic, or anxiolytic withdrawal delirium 11/15/2016  . Paroxysmal atrial fibrillation ([facility])   . Cerebrovascular disease   . Fever   . Acute encephalopathy 11/10/2016  . Hypothyroidism 11/10/2016  . Hypertensive urgency 11/10/2016  . Altered mental state 11/10/2016  . Seizure ([place]) 11/10/2016  . Chronic diastolic CHF (congestive heart failure) ([place])   . Left carotid stenosis 10/29/2016  . S/P total knee replacement 03/10/2016  . 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 Ileus ([place]) 09/05/2014  . Leukocytosis 09/05/2014  . Vomiting 09/04/2014  . Pulmonary edema 09/04/2014  . Hypokalemia 08/28/2014  . Enteritis due to Clostridium difficile 08/27/2014  . 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 Protein  calorie malnutrition ([place]) 08/24/2014  . Obstructive jaundice 08/23/2014  . Acute cholangitis 08/23/2014  . ARF (acute renal failure) ([place]) 08/23/2014  . Coagulopathy ([place]) 08/23/2014  . Back pain   . Arthritis   . Hypertension   . GERD (gastroesophageal reflux disease)   . Anxiety   . IBS (irritable bowel syndrome)   . Hernia of Right posterior flank 04/18/2012    Past Surgical History:  Procedure Laterality Date  . ABDOMINAL HYSTERECTOMY     partial  . BACK SURGERY  2010   thoractic-screws and rods  . BILE DUCT STENT PLACEMENT  08/26/2014  . BREAST  REDUCTION SURGERY    . CHOLECYSTECTOMY  1988   lap  . COLONOSCOPY    . COLONOSCOPY Left 09/08/2014   Procedure: COLONOSCOPY;  Surgeon: Arta Silence, MD;  Location: [facility] ENDOSCOPY;  Service: Endoscopy;  Laterality: Left;  . ENDARTERECTOMY Left 12/28/2016  . ENDARTERECTOMY Left 12/28/2016   Procedure: ENDARTERECTOMY CAROTID LEFT WITH XENOSURE BIOLOGIC PATCH ANGIOPLASTY;  Surgeon: Angelia Mould, MD;  Location: [facility];  Service: Vascular;  Laterality: Left;  . ERCP    . ERCP N/A 08/26/2014   Procedure: ENDOSCOPIC RETROGRADE CHOLANGIOPANCREATOGRAPHY (ERCP);  Surgeon: Jeryl Columbia, MD;  Location: [facility] ENDOSCOPY;  Service: Endoscopy;  Laterality: N/A;  . ERCP N/A 09/11/2014   Procedure: ENDOSCOPIC RETROGRADE CHOLANGIOPANCREATOGRAPHY (ERCP);  Surgeon: Arta Silence, MD;  Location: [facility] ENDOSCOPY;  Service: Endoscopy;  Laterality: N/A;  . EUS N/A 09/11/2014   Procedure: ESOPHAGEAL ENDOSCOPIC ULTRASOUND (EUS) RADIAL;  Surgeon: Arta Silence, MD;  Location: [facility] ENDOSCOPY;  Service: Endoscopy;  Laterality: N/A;  . EYE SURGERY Bilateral    Cataract  . JOINT REPLACEMENT  2005   left shoulder replacement   . ORIF CLAVICULAR FRACTURE Left 07/18/2014   Procedure: OPEN REDUCTION INTERNAL FIXATION (ORIF) LEFT CLAVICULAR FRACTURE;  Surgeon: Ninetta Lights, MD;  Location: [facility];  Service: Orthopedics;  Laterality: Left;  . SPYGLASS CHOLANGIOSCOPY N/A 09/11/2014   Procedure: SPYGLASS CHOLANGIOSCOPY;  Surgeon: Arta Silence, MD;  Location: [facility] ENDOSCOPY;  Service: Endoscopy;  Laterality: N/A;  . TONSILLECTOMY  as child  . TOTAL KNEE ARTHROPLASTY Left 03/10/2016   Procedure: LEFT TOTAL KNEE ARTHROPLASTY;  Surgeon: Ninetta Lights, MD;  Location: [facility];  Service: Orthopedics;  Laterality: Left;  [name] VENTRAL HERNIA REPAIR  06/08/2012   Procedure: LAPAROSCOPIC VENTRAL HERNIA;  Surgeon: Adin Hector, MD;  Location: [facility] ORS;  Service: General;  Laterality: Right;     OB History   No obstetric  history on file.      Home Medications    Prior to Admission medications   Medication Sig Start Date End Date Taking? Authorizing Provider  acetaminophen (TYLENOL) 325 MG tablet Take 1-2 tablets (325-650 mg total) by mouth every 4 (four) hours as needed for mild pain. 04/03/19   Love, Ivan Anchors, PA-C  amoxicillin-clavulanate (AUGMENTIN) 875-125 MG tablet Take 1 tablet by mouth every 12 (twelve) hours. 05/29/19   Kendrick, Caitlyn S, PA-C  aspirin EC 81 MG tablet Take 81 mg by mouth daily.    [provider]  cephALEXin (KEFLEX) 250 MG capsule Take 1 capsule (250 mg total) by mouth every 8 (eight) hours. 04/03/19   Love, Ivan Anchors, PA-C  chlordiazePOXIDE (LIBRIUM) 5 MG capsule Take 1 capsule (5 mg total) by mouth 2 (two) times daily as needed for anxiety. 04/03/19   Love, Ivan Anchors, PA-C  FLUoxetine (PROZAC) 20 MG capsule Take 1 capsule (20 mg total) by mouth daily.  04/03/19   Love, Ivan Anchors, PA-C  fluticasone (FLONASE) 50 MCG/ACT nasal spray Place 1 spray into both nostrils daily. 04/04/19   Love, Ivan Anchors, PA-C  folic acid (FOLVITE) 1 MG tablet Take 1 tablet (1 mg total) by mouth daily. 04/03/19   Love, Ivan Anchors, PA-C  furosemide (LASIX) 20 MG tablet Take 1 tablet (20 mg total) by mouth daily. 04/03/19   Love, Ivan Anchors, PA-C  hydrALAZINE (APRESOLINE) 25 MG tablet Take 3 tablets (75 mg total) by mouth every 8 (eight) hours. 04/03/19   Love, Ivan Anchors, PA-C  hyoscyamine (LEVBID) 0.375 MG 12 hr tablet Take 1 tablet (0.375 mg total) by mouth every 12 (twelve) hours as needed for cramping. 04/03/19   Love, Ivan Anchors, PA-C  ketotifen (ZADITOR) 0.025 % ophthalmic solution Place 1 drop into both eyes 2 (two) times daily. 04/03/19   Love, Ivan Anchors, PA-C  levothyroxine (SYNTHROID) 100 MCG tablet Take 1 tablet (100 mcg total) by mouth daily at 6 (six) AM. 04/04/19   Love, Ivan Anchors, PA-C  metoprolol succinate (TOPROL-XL) 25 MG 24 hr tablet Take 3 tablets (75 mg total) by mouth daily. 04/04/19   Love, Ivan Anchors,  PA-C  pantoprazole (PROTONIX) 40 MG tablet Take 1 tablet (40 mg total) by mouth daily. Patient not taking: Reported on 04/24/2019 04/04/19   Love, Ivan Anchors, PA-C  QUEtiapine (SEROQUEL) 25 MG tablet Take 1 tablet (25 mg total) by mouth at bedtime. Patient taking differently: Take 25 mg by mouth at bedtime. 2 50mg  qhs 04/03/19   Love, Ivan Anchors, PA-C  saccharomyces boulardii (FLORASTOR) 250 MG capsule Take 1 capsule (250 mg total) by mouth 2 (two) times daily. 04/03/19   Love, Ivan Anchors, PA-C  senna (SENOKOT) 8.6 MG TABS tablet Take 2 tablets (17.2 mg total) by mouth at bedtime. 04/03/19   Love, Ivan Anchors, PA-C  simvastatin (ZOCOR) 10 MG tablet Take 1 tablet (10 mg total) by mouth daily at 6 PM. 04/03/19   Love, Ivan Anchors, PA-C  thiamine 100 MG tablet Take 1 tablet (100 mg total) by mouth daily. 03/14/19   Thurnell Lose, MD  venlafaxine XR (EFFEXOR-XR) 37.5 MG 24 hr capsule Take 1 capsule (37.5 mg total) by mouth daily with breakfast. 04/04/19   Love, Ivan Anchors, PA-C  zolpidem (AMBIEN) 5 MG tablet Take 1 tablet (5 mg total) by mouth at bedtime as needed for sleep. 04/03/19   Bary Leriche, PA-C    Family History Family History  Problem Relation Age of Onset  . Heart disease Mother   . Cancer Father        lung  . Bipolar disorder Brother     Social History Social History   Tobacco Use  . Smoking status: Never Smoker  . Smokeless tobacco: Never Used  Substance Use Topics  . Alcohol use: Yes    Alcohol/week: 7.0 standard drinks    Types: 7 Glasses of wine per week    Comment: rare  . Drug use: No    Types: Benzodiazepines     Allergies   Tetanus toxoids, Snake antivenin [antivenin crotalidae polyvalent], and Yellow dyes (non-tartrazine)   Review of Systems Review of Systems  Unable to perform ROS: Mental status change  Psychiatric/Behavioral: Positive for confusion.     Physical Exam Updated Vital Signs BP (!) 147/73 (BP Location: Right Arm)   Pulse 63   Temp (!) 97.4 F (36.3  C) (Oral)   Resp 16   Ht 1.524 m (5')  Wt 74 kg   LMP  (Exact Date)   SpO2 97%   BMI 31.86 kg/m   Physical Exam CONSTITUTIONAL: elderly, appears confused HEAD: Normocephalic/atraumatic EYES: EOMI/PERRL ENMT: Mucous membranes moist NECK: supple no meningeal signs SPINE/BACK:entire spine nontender CV: S1/S2 noted, no murmurs/rubs/gallops noted LUNGS: Lungs are clear to auscultation bilaterally, no apparent distress Chest - bruising noted to posterior chest ABDOMEN: soft, nontender  GU:no cva tenderness NEURO: Pt is awake/alert, moves all extremitiesx4.  No facial droop.  She appears confused.  She does not answer all questions appropriately, answers nonsensically EXTREMITIES: pulses normal/equal, full ROM, no deformities, pelvis stable SKIN: pale PSYCH: anxious  ED Treatments / Results  Labs (all labs ordered are listed, but only abnormal results are displayed) Labs Reviewed  COMPREHENSIVE METABOLIC PANEL - Abnormal; Notable for the following components:      Result Value   Glucose, Bld 111 (*)    BUN 30 (*)    Creatinine, Ser 1.18 (*)    Calcium 8.7 (*)    Total Protein 6.0 (*)    Albumin 3.1 (*)    GFR calc non Af Amer 44 (*)    GFR calc Af Amer 50 (*)    All other components within normal limits  CBC WITH DIFFERENTIAL/PLATELET - Abnormal; Notable for the following components:   RBC 3.65 (*)    Hemoglobin 11.0 (*)    HCT 34.3 (*)    Platelets 135 (*)    All other components within normal limits  AMMONIA - Abnormal; Notable for the following components:   Ammonia 36 (*)    All other components within normal limits  ACETAMINOPHEN LEVEL - Abnormal; Notable for the following components:   Acetaminophen (Tylenol), Serum <10 (*)    All other components within normal limits  CBG MONITORING, ED - Abnormal; Notable for the following components:   Glucose-Capillary 102 (*)    All other components within normal limits  NOVEL CORONAVIRUS, NAA (HOSPITAL ORDER, SEND-OUT TO  REF LAB)  LACTIC ACID, PLASMA  ETHANOL  SALICYLATE LEVEL  URINALYSIS, COMPLETE (UACMP) WITH MICROSCOPIC  RAPID URINE DRUG SCREEN, HOSP PERFORMED    EKG    Radiology Ct Head Wo Contrast  Result Date: 06/03/2019 CLINICAL DATA:  Altered level of consciousness.  No reported injury. EXAM: CT HEAD WITHOUT CONTRAST TECHNIQUE: Contiguous axial images were obtained from the base of the skull through the vertex without intravenous contrast. COMPARISON:  03/29/2019 head CT. FINDINGS: Brain: No evidence of parenchymal hemorrhage or extra-axial fluid collection. No mass lesion, mass effect, or midline shift. No CT evidence of acute infarction. Nonspecific mild subcortical and periventricular white matter hypodensity, most in keeping with chronic small vessel ischemic change. Cerebral volume is age appropriate. No ventriculomegaly. Vascular: No acute abnormality. Skull: No evidence of calvarial fracture. Sinuses/Orbits: The visualized paranasal sinuses are essentially clear. Other: Near complete right mastoid effusion. Clear left mastoid air cells. IMPRESSION: 1.  No evidence of acute intracranial abnormality. 2. Mild chronic small vessel ischemic changes in cerebral white matter. 3. Near complete right mastoid effusion, nonspecific. Electronically Signed   By: Ilona Sorrel M.D.   On: 06/03/2019 05:07   Dg Chest Port 1 View  Result Date: 06/03/2019 CLINICAL DATA:  83 y/o  F; weakness. EXAM: PORTABLE CHEST 1 VIEW COMPARISON:  03/30/2019 chest radiograph. FINDINGS: Stable cardiomegaly given projection and technique. Plate and screw fixation of the left lateral clavicle. Left shoulder hemiarthroplasty. Thoracolumbar fusion apparatus, partially visualized. Aortic atherosclerosis with calcification. Diffuse bronchitic changes. No focal  consolidation. No pleural effusion or pneumothorax. No acute osseous abnormality is evident. IMPRESSION: Stable cardiomegaly. Diffuse bronchitic changes. No focal consolidation.  Electronically Signed   By: Kristine Garbe M.D.   On: 06/03/2019 05:07    Procedures .Critical Care Performed by: Ripley Fraise, MD Authorized by: Ripley Fraise, MD   Critical care provider statement:    Critical care time (minutes):  35   Critical care start time:  06/03/2019 6:25 AM   Critical care end time:  06/03/2019 7:00 AM   Critical care time was exclusive of:  Separately billable procedures and treating other patients   Critical care was necessary to treat or prevent imminent or life-threatening deterioration of the following conditions:  CNS failure or compromise   Critical care was time spent personally by me on the following activities:  Evaluation of patient's response to treatment, examination of patient, review of old charts, pulse oximetry, re-evaluation of patient's condition, ordering and review of radiographic studies and ordering and review of laboratory studies      Medications Ordered in ED Medications  LORazepam (ATIVAN) injection 1 mg (1 mg Intravenous Given 06/03/19 0521)  LORazepam (ATIVAN) injection 2 mg (2 mg Intravenous Given 06/03/19 0626)  ziprasidone (GEODON) injection 10 mg (10 mg Intramuscular Given 06/03/19 0726)     Initial Impression / Assessment and Plan / ED Course  I have reviewed the triage vital signs and the nursing notes.  Pertinent labs & imaging results that were available during my care of the patient were reviewed by me and considered in my medical decision making (see chart for details).        4:51 AM Discussed this with daughter.  She reports yesterday she was at her mental baseline, tonight she appeared confused, talking nonsensically and may have been hallucinating.  She did have a recent ear infection, has been taking medicines as prescribed.  No abrupt change in her benzodiazepines. 5:34 AM Patient is tremulous and is hallucinating. Will give Ativan, awaiting urinalysis. She would need to be admitted for  delirium 6:06 AM Patient is delirium is worsening.  She is kicking her legs at staff.  She is a fall risk. Unclear cause of acute delirium. 7:32 AM Continues to be agitated.  She is now requiring observation, was given Geodon. Concern for medication withdrawal or possible toxidrome, but labs thus far unremarkable No significant ETOH abuse history No signs of meningitis - neck supple, afebrile, no leukocytosis Will admit to the hospitalist for further evaluation. 7:44 AM D/w dr Thora Lance for admission  Final Clinical Impressions(s) / ED Diagnoses   Final diagnoses:  Delirium    ED Discharge Orders    None       Ripley Fraise, MD 06/03/19 [phone]

## 2019-06-03 NOTE — Progress Notes (Signed)
[name] [name] is a 83 y.o. female patient admitted from ED awake, alert - oriented  X 4 - no acute distress noted.  VSS - Blood pressure (!) 138/53, pulse (!) 58, temperature 97.8 F (36.6 C), temperature source Axillary, resp. rate 18, height 5' (1.524 m), weight 74 kg, SpO2 95 %.    IV in place, occlusive dsg intact without redness.  Orientation to room, and floor completed with information packet given to patient/family.  Patient declined safety video at this time.  Admission INP armband ID verified with patient/family, and in place.   SR up x 2, fall assessment complete, with patient and family able to verbalize understanding of risk associated with falls, and verbalized understanding to call nsg before up out of bed.  Call light within reach, patient able to voice, and demonstrate understanding.  Skin, clean-dry- intact without evidence of bruising, or skin tears.   No evidence of skin break down noted on exam. Patient 1-1 sitter at bedside.     Will cont to eval and treat per MD orders.  Tama High, RN 06/03/2019 5:09 PM

## 2019-06-03 NOTE — ED Notes (Addendum)
ED TO INPATIENT HANDOFF REPORT  ED Nurse Name and Phone #: Caprice Kluver [phone]  S Name/Age/Gender [name] [name] 83 y.o. female Room/Bed: 050C/050C  Code Status   Code Status: Full Code  Home/SNF/Other Home Patient oriented to: self Is this baseline? No   Triage Complete: Triage complete  Chief Complaint ALOC  Triage Note Pt BIB [facility] for altered mental status. EMS reports pt's daughter advised that at midnight the pt reported altered mental status. Family told the pt that she has not been diagnosed with dementia or alzheimer's. EMS advised the pt is redirectable but is definitely altered. EMS reports the pt was recently taken off the seroquel for roughly about a week now. EMS reports upper bilateral arm tremors. EMS reports IV established, vital signs stable, 12 lead unremarkable.   Pt reports nothing wrong. Pt does have scattered bruising across her body.    Allergies Allergies  Allergen Reactions  . Tetanus Toxoids Swelling and Other (See Comments)    Arm (site) became swollen as a child  . Snake Antivenin [Antivenin Crotalidae Polyvalent] Other (See Comments)    Just can't take per daughter  . Yellow Dyes (Non-Tartrazine) Itching and Other (See Comments)    Makes patient nervous     Level of Care/Admitting Diagnosis ED Disposition    ED Disposition Condition [facility] Area: [place] [[id number]]  Level of Care: Med-Surg [16]  Covid Evaluation: Screening Protocol (No Symptoms)  Diagnosis: Metabolic encephalopathy [382.50.ICD-9-CM]  Admitting Physician: Guilford Shi [[id number]]  Attending Physician: Guilford Shi [[id number]]  Estimated length of stay: 3 - 4 days  Certification:: I certify this patient will need inpatient services for at least 2 midnights  PT Class (Do Not Modify): Inpatient [101]  PT Acc Code (Do Not Modify): Private [1]       B Medical/Surgery History Past Medical History:  Diagnosis Date  . Alopecia   .  Anemia    hx of years ago   . Anxiety   . ARF (acute renal failure) ([place]) 08/23/2014  . Arthritis    psoriatic arthritis  . Back pain   . Blood transfusion    at age of 4  . Carotid stenosis 12/2016  . Chronic diastolic CHF (congestive heart failure) ([place])   . Depression   . Dysrhythmia 11/13/2016   PAFib for a short time- Echo done 11/14/16 PAF- to follow up with PCP- to see if she is a candiate for anticoags.  Not started at times time due to alcohol abuse.  [name] GERD (gastroesophageal reflux disease)   . H/O hiatal hernia   . History of acute cholangitis   . History of GI diverticular bleed   . Hyperlipidemia   . Hypertension   . Hypothyroidism   . IBS (irritable bowel syndrome)   . Pancreatitis   . Peripheral vascular disease ([place])   . PONV (postoperative nausea and vomiting)    with Breast reduction- only time  . Rosacea   . Seizures ([place]) 11/13/2016   Thopught to be from withdrawl Benzodiazepine  . Stroke [facility])    patient denies-  . Thyroid disease    Past Surgical History:  Procedure Laterality Date  . ABDOMINAL HYSTERECTOMY     partial  . BACK SURGERY  2010   thoractic-screws and rods  . BILE DUCT STENT PLACEMENT  08/26/2014  . BREAST REDUCTION SURGERY    . CHOLECYSTECTOMY  1988   lap  . COLONOSCOPY    . COLONOSCOPY Left 09/08/2014  Procedure: COLONOSCOPY;  Surgeon: Arta Silence, MD;  Location: [facility] ENDOSCOPY;  Service: Endoscopy;  Laterality: Left;  . ENDARTERECTOMY Left 12/28/2016  . ENDARTERECTOMY Left 12/28/2016   Procedure: ENDARTERECTOMY CAROTID LEFT WITH XENOSURE BIOLOGIC PATCH ANGIOPLASTY;  Surgeon: Angelia Mould, MD;  Location: [place];  Service: Vascular;  Laterality: Left;  . ERCP    . ERCP N/A 08/26/2014   Procedure: ENDOSCOPIC RETROGRADE CHOLANGIOPANCREATOGRAPHY (ERCP);  Surgeon: Jeryl Columbia, MD;  Location: [facility] ENDOSCOPY;  Service: Endoscopy;  Laterality: N/A;  . ERCP N/A 09/11/2014   Procedure: ENDOSCOPIC RETROGRADE CHOLANGIOPANCREATOGRAPHY  (ERCP);  Surgeon: Arta Silence, MD;  Location: [facility] ENDOSCOPY;  Service: Endoscopy;  Laterality: N/A;  . EUS N/A 09/11/2014   Procedure: ESOPHAGEAL ENDOSCOPIC ULTRASOUND (EUS) RADIAL;  Surgeon: Arta Silence, MD;  Location: [facility] ENDOSCOPY;  Service: Endoscopy;  Laterality: N/A;  . EYE SURGERY Bilateral    Cataract  . JOINT REPLACEMENT  2005   left shoulder replacement   . ORIF CLAVICULAR FRACTURE Left 07/18/2014   Procedure: OPEN REDUCTION INTERNAL FIXATION (ORIF) LEFT CLAVICULAR FRACTURE;  Surgeon: Ninetta Lights, MD;  Location: [place];  Service: Orthopedics;  Laterality: Left;  . SPYGLASS CHOLANGIOSCOPY N/A 09/11/2014   Procedure: SPYGLASS CHOLANGIOSCOPY;  Surgeon: Arta Silence, MD;  Location: [facility] ENDOSCOPY;  Service: Endoscopy;  Laterality: N/A;  . TONSILLECTOMY  as child  . TOTAL KNEE ARTHROPLASTY Left 03/10/2016   Procedure: LEFT TOTAL KNEE ARTHROPLASTY;  Surgeon: Ninetta Lights, MD;  Location: [place];  Service: Orthopedics;  Laterality: Left;  [name] VENTRAL HERNIA REPAIR  06/08/2012   Procedure: LAPAROSCOPIC VENTRAL HERNIA;  Surgeon: Adin Hector, MD;  Location: [facility] ORS;  Service: General;  Laterality: Right;     A IV Location/Drains/Wounds Patient Lines/Drains/Airways Status   Active Line/Drains/Airways    Name:   Placement date:   Placement time:   Site:   Days:   Peripheral IV 06/03/19 Left Hand   06/03/19    1523    Hand   less than 1          Intake/Output Last 24 hours  Intake/Output Summary (Last 24 hours) at 06/03/2019 1611 Last data filed at 06/03/2019 0417 Gross per 24 hour  Intake 10 ml  Output -  Net 10 ml    Labs/Imaging Results for orders placed or performed during the hospital encounter of 06/03/19 (from the past 48 hour(s))  Comprehensive metabolic panel     Status: Abnormal   Collection Time: 06/03/19  4:20 AM  Result Value Ref Range   Sodium 139 135 - 145 mmol/L   Potassium 3.9 3.5 - 5.1 mmol/L   [place] 104 98 - 111 mmol/L   CO2 24 22  - 32 mmol/L   Glucose, Bld 111 (H) 70 - 99 mg/dL   BUN 30 (H) 8 - 23 mg/dL   Creatinine, Ser 1.18 (H) 0.44 - 1.00 mg/dL   Calcium 8.7 (L) 8.9 - 10.3 mg/dL   Total Protein 6.0 (L) 6.5 - 8.1 g/dL   Albumin 3.1 (L) 3.5 - 5.0 g/dL   AST 24 15 - 41 U/L   ALT 25 0 - 44 U/L   Alkaline Phosphatase 87 38 - 126 U/L   Total Bilirubin 0.4 0.3 - 1.2 mg/dL   GFR calc non Af Amer 44 (L) >60 mL/min   GFR calc Af Amer 50 (L) >60 mL/min   Anion gap 11 5 - 15    Comment: Performed at [facility], [address],  [place], [place] [address]  CBC WITH DIFFERENTIAL     Status: Abnormal   Collection Time: 06/03/19  4:20 AM  Result Value Ref Range   WBC 8.0 4.0 - 10.5 K/uL   RBC 3.65 (L) 3.87 - 5.11 MIL/uL   Hemoglobin 11.0 (L) 12.0 - 15.0 g/dL   HCT 34.3 (L) 36.0 - 46.0 %   MCV 94.0 80.0 - 100.0 fL   MCH 30.1 26.0 - 34.0 pg   MCHC 32.1 30.0 - 36.0 g/dL   RDW 13.2 11.5 - 15.5 %   Platelets 135 (L) 150 - 400 K/uL   nRBC 0.0 0.0 - 0.2 %   Neutrophils Relative % 72 %   Neutro Abs 5.7 1.7 - 7.7 K/uL   Lymphocytes Relative 14 %   Lymphs Abs 1.1 0.7 - 4.0 K/uL   Monocytes Relative 12 %   Monocytes Absolute 1.0 0.1 - 1.0 K/uL   Eosinophils Relative 2 %   Eosinophils Absolute 0.1 0.0 - 0.5 K/uL   Basophils Relative 0 %   Basophils Absolute 0.0 0.0 - 0.1 K/uL   Immature Granulocytes 0 %   Abs Immature Granulocytes 0.03 0.00 - 0.07 K/uL    Comment: Performed at [facility], [address], [place], [place] [address]  Ethanol     Status: None   Collection Time: 06/03/19  4:20 AM  Result Value Ref Range   Alcohol, Ethyl (B) <10 <10 mg/dL    Comment: (NOTE) Lowest detectable limit for serum alcohol is 10 mg/dL. For medical purposes only. Performed at [facility], [place] [address], [place], [place] [address]   Salicylate level     Status: None   Collection Time: 06/03/19  4:20 AM  Result Value Ref Range   Salicylate Lvl <2.0 2.8 - 30.0 mg/dL    Comment: Performed at [facility] [address] [place], [place] [address]  Acetaminophen level     Status: Abnormal   Collection Time: 06/03/19  4:20 AM  Result Value Ref Range   Acetaminophen (Tylenol), Serum <10 (L) 10 - 30 ug/mL    Comment: (NOTE) Therapeutic concentrations vary significantly. A range of 10-30 ug/mL  may be an effective concentration for many patients. However, some  are best treated at concentrations outside of this range. Acetaminophen concentrations >150 ug/mL at 4 hours after ingestion  and >50 ug/mL at 12 hours after ingestion are often associated with  toxic reactions. Performed at [facility], [place] [address], [place], [place] [address]   Ammonia     Status: Abnormal   Collection Time: 06/03/19  4:32 AM  Result Value Ref Range   Ammonia 36 (H) 9 - 35 umol/L    Comment: Performed at [facility], [place] [address] [place], [place] [address]  Lactic acid, plasma     Status: None   Collection Time: 06/03/19  4:32 AM  Result Value Ref Range   Lactic Acid, Venous 0.9 0.5 - 1.9 mmol/L    Comment: Performed at [facility] [address], [place], [place] [address]  CBG monitoring, ED     Status: Abnormal   Collection Time: 06/03/19  4:54 AM  Result Value Ref Range   Glucose-Capillary 102 (H) 70 - 99 mg/dL  Urinalysis, Complete w Microscopic     Status: Abnormal   Collection Time: 06/03/19  9:45 AM  Result Value Ref Range   Color, Urine STRAW (A) YELLOW   APPearance CLEAR  CLEAR   Specific Gravity, Urine 1.005 1.005 - 1.030   pH 5.0 5.0 - 8.0   Glucose, UA NEGATIVE NEGATIVE mg/dL   Hgb urine dipstick NEGATIVE NEGATIVE   Bilirubin Urine NEGATIVE NEGATIVE   Ketones, ur NEGATIVE NEGATIVE mg/dL   Protein, ur NEGATIVE NEGATIVE mg/dL   Nitrite NEGATIVE NEGATIVE   Leukocytes,Ua NEGATIVE NEGATIVE   RBC / HPF 0-5 0 - 5 RBC/hpf   WBC, UA 0-5 0 - 5 WBC/hpf   Bacteria, UA NONE SEEN NONE SEEN   Squamous Epithelial / LPF 0-5 0 - 5    Comment: Performed at [facility], [place] [address] [place], [place] [address]  Urine rapid drug screen (hosp performed)     Status: Abnormal   Collection Time: 06/03/19  9:45 AM  Result Value Ref Range   Opiates NONE DETECTED NONE DETECTED   Cocaine NONE DETECTED NONE DETECTED   Benzodiazepines POSITIVE (A) NONE DETECTED   Amphetamines NONE DETECTED NONE DETECTED   Tetrahydrocannabinol NONE DETECTED NONE DETECTED   Barbiturates NONE DETECTED NONE DETECTED    Comment: (NOTE) DRUG SCREEN FOR MEDICAL PURPOSES ONLY.  IF CONFIRMATION IS NEEDED FOR ANY PURPOSE, NOTIFY LAB WITHIN 5 DAYS. LOWEST DETECTABLE LIMITS FOR URINE DRUG SCREEN Drug Class                     Cutoff (ng/mL) Amphetamine and metabolites    1000 Barbiturate and metabolites    200 Benzodiazepine                 616 Tricyclics and metabolites     300 Opiates and metabolites        300 Cocaine and metabolites        300 THC                            50 Performed at [facility], [place] [address], [place], [place] [address]   CBC     Status: Abnormal   Collection Time: 06/03/19 10:15 AM  Result Value Ref Range   WBC 8.1 4.0 - 10.5 K/uL   RBC 3.53 (L) 3.87 - 5.11 MIL/uL   Hemoglobin 10.5 (L) 12.0 - 15.0 g/dL   HCT 34.1 (L) 36.0 - 46.0 %   MCV 96.6 80.0 - 100.0 fL   MCH 29.7 26.0 - 34.0 pg   MCHC 30.8 30.0 - 36.0 g/dL   RDW 13.4 11.5 - 15.5 %   Platelets 106 (L) 150 - 400 K/uL    Comment: REPEATED TO VERIFY PLATELET COUNT CONFIRMED BY SMEAR SPECIMEN CHECKED FOR CLOTS Immature Platelet Fraction may be clinically indicated, consider ordering this additional test [id number]    nRBC 0.5 (H) 0.0 - 0.2 %    Comment: Performed at [facility], [place] [address] [place], [place] [address]  Creatinine, serum     Status: Abnormal   Collection Time: 06/03/19 10:15 AM  Result Value Ref Range   Creatinine, Ser 1.10 (H) 0.44 - 1.00 mg/dL   GFR calc non Af Amer 47 (L) >60 mL/min   GFR calc Af Amer 55 (L) >60 mL/min    Comment: Performed  at [facility] [address] [place], [place] [address]   Ct Head Wo Contrast  Result Date: 06/03/2019 CLINICAL DATA:  Altered level of consciousness.  No reported injury. EXAM: CT HEAD WITHOUT CONTRAST TECHNIQUE: Contiguous axial images were obtained from the  base of the skull through the vertex without intravenous contrast. COMPARISON:  03/29/2019 head CT. FINDINGS: Brain: No evidence of parenchymal hemorrhage or extra-axial fluid collection. No mass lesion, mass effect, or midline shift. No CT evidence of acute infarction. Nonspecific mild subcortical and periventricular white matter hypodensity, most in keeping with chronic small vessel ischemic change. Cerebral volume is age appropriate. No ventriculomegaly. Vascular: No acute abnormality. Skull: No evidence of calvarial fracture. Sinuses/Orbits: The visualized paranasal sinuses are essentially clear. Other: Near complete right mastoid effusion. Clear left mastoid air cells. IMPRESSION: 1.  No evidence of acute intracranial abnormality. 2. Mild chronic small vessel ischemic changes in cerebral white matter. 3. Near complete right mastoid effusion, nonspecific. Electronically Signed   By: Ilona Sorrel M.D.   On: 06/03/2019 05:07   Dg Chest Port 1 View  Result Date: 06/03/2019 CLINICAL DATA:  83 y/o  F; weakness. EXAM: PORTABLE CHEST 1 VIEW COMPARISON:  03/30/2019 chest radiograph. FINDINGS: Stable cardiomegaly given projection and technique. Plate and screw fixation of the left lateral clavicle. Left shoulder hemiarthroplasty. Thoracolumbar fusion apparatus, partially visualized. Aortic atherosclerosis with calcification. Diffuse bronchitic changes. No focal consolidation. No pleural effusion or pneumothorax. No acute osseous abnormality is evident. IMPRESSION: Stable cardiomegaly. Diffuse bronchitic changes. No focal consolidation. Electronically Signed   By: Kristine Garbe M.D.   On: 06/03/2019 05:07    Pending Labs Unresulted  Labs (From admission, onward)    Start     Ordered   06/10/19 0500  Creatinine, serum  (enoxaparin (LOVENOX)    CrCl >/= 30 ml/min)  Weekly,   R    Comments: while on enoxaparin therapy    06/03/19 1003   06/04/19 3235  Basic metabolic panel  Tomorrow morning,   R     06/03/19 1003   06/04/19 0500  CBC  Tomorrow morning,   R     06/03/19 1003   06/03/19 0646  Novel Coronavirus,NAA,(SEND-OUT TO REF LAB - TAT 24-48 hrs); Hosp Order  (Asymptomatic Patients Labs)  Once,   STAT    Question:  Rule Out  Answer:  Yes   06/03/19 0645          Vitals/Pain Today's Vitals   06/03/19 1000 06/03/19 1015 06/03/19 1030 06/03/19 1544  BP: (!) 147/64 (!) 130/53 (!) 143/75 102/61  Pulse:    (!) 57  Resp: 20 20 (!) 21 18  Temp:    97.8 F (36.6 C)  TempSrc:    Axillary  SpO2:    94%  Weight:      Height:      PainSc:        Isolation Precautions No active isolations  Medications Medications  aspirin EC tablet 81 mg (81 mg Oral Not Given 06/03/19 1336)  hydrALAZINE (APRESOLINE) tablet 75 mg (75 mg Oral Given 06/03/19 1451)  0.9 %  sodium chloride infusion ( Intravenous New Bag/Given 06/03/19 1407)  simvastatin (ZOCOR) tablet 10 mg (has no administration in time range)  venlafaxine XR (EFFEXOR-XR) 24 hr capsule 37.5 mg (has no administration in time range)  FLUoxetine (PROZAC) capsule 20 mg (20 mg Oral Not Given 06/03/19 1337)  levothyroxine (SYNTHROID, LEVOTHROID) injection 25 mcg (25 mcg Intravenous Not Given 06/03/19 1338)  pantoprazole (PROTONIX) EC tablet 40 mg (40 mg Oral Not Given 06/03/19 1338)  saccharomyces boulardii (FLORASTOR) capsule 250 mg (250 mg Oral Not Given 06/03/19 1338)  senna (SENOKOT) tablet 17.2 mg (has no administration in time range)  folic acid (FOLVITE) tablet 1 mg (1 mg  Oral Not Given 06/03/19 1337)  thiamine tablet 100 mg (100 mg Oral Not Given 06/03/19 1338)  fluticasone (FLONASE) 50 MCG/ACT nasal spray 1 spray (1 spray Each Nare Not Given 06/03/19 1337)  ketotifen  (ZADITOR) 0.025 % ophthalmic solution 1 drop (1 drop Both Eyes Not Given 06/03/19 1337)  enoxaparin (LOVENOX) injection 40 mg (40 mg Subcutaneous Not Given 06/03/19 1336)  acetaminophen (TYLENOL) tablet 650 mg (has no administration in time range)    Or  acetaminophen (TYLENOL) suppository 650 mg (has no administration in time range)  ondansetron (ZOFRAN) tablet 4 mg (has no administration in time range)    Or  ondansetron (ZOFRAN) injection 4 mg (has no administration in time range)  haloperidol lactate (HALDOL) injection 2 mg (has no administration in time range)  metoprolol tartrate (LOPRESSOR) injection 2.5 mg (2.5 mg Intravenous Not Given 06/03/19 1601)  QUEtiapine (SEROQUEL) tablet 100 mg (has no administration in time range)  LORazepam (ATIVAN) injection 1 mg (1 mg Intravenous Given 06/03/19 0521)  LORazepam (ATIVAN) injection 2 mg (2 mg Intravenous Given 06/03/19 0626)  ziprasidone (GEODON) injection 10 mg (10 mg Intramuscular Given 06/03/19 0726)    Mobility non-ambulatory High fall risk   Focused Assessments Neuro Assessment Handoff:  Swallow screen pass? NO Cardiac Rhythm: Normal sinus rhythm   Last date known well: 06/02/19 Last time known well: 2359 Neuro Assessment: Exceptions to WDL Neuro Checks:      Last Documented NIHSS Modified Score:   Has TPA been given? No If patient is a Neuro Trauma and patient is going to OR before floor call report to [place] nurse: [phone] or [phone]      R Recommendations: See Admitting Provider Note  Report given to:   Additional Notes:

## 2019-06-03 NOTE — ED Provider Notes (Signed)
EKG Interpretation  Date/Time:  Sunday June 03 2019 09:26:06 EDT Ventricular Rate:  66 PR Interval:    QRS Duration: 99 QT Interval:  413 QTC Calculation: 433 R Axis:   2 Text Interpretation:  Sinus rhythm Atrial premature complexes Prolonged PR interval Baseline wander in lead(s) III no change from previous Confirmed by Charlesetta Shanks [id number]) on 06/03/2019 2:01:15 PM         Ripley Fraise, MD 06/03/19 2336

## 2019-06-03 NOTE — ED Notes (Addendum)
Contacted pt's daughter Belenda Cruise to sit with her mother due to extreme confusion and agitation. She advised she is not able to come up to the hospital and sit with her mother.

## 2019-06-03 NOTE — ED Notes (Signed)
Attempted Report 

## 2019-06-03 NOTE — ED Notes (Signed)
Pt. Placed on purewick.  

## 2019-06-03 NOTE — H&P (Addendum)
History and Physical    DOA: 06/03/2019  PCP: [name], MD  Patient coming from: Home  Chief Complaint: Altered mental status since yesterday morning  HPI: [name] [name] is a 83 y.o. female with history h/o CVA, CAD, paroxysmal atrial fibrillation not on anticoagulation secondary to falls, hypertension, diastolic CHF, anxiety/depression on multiple psychiatric medications, hypothyroidism who was brought to the hospital for altered mental status.  According to the daughter patient appeared to be at her baseline yesterday morning when daughter informed her that she was going to walk the dogs and be back soon.  Upon daughter's return, patient was found on the floor in the sun room.  It is not clear if she hit her head.  According to the daughter patient appeared somewhat confused during the day but was able to be reoriented.  Apparently patient and daughter watch TV all day.  On 1 or 2 occasions patient appeared to be exhibiting bizarre behavior like  picking on things in the air as if trying to open a packet of chips while watching TV.  Later in the night, patient's condition worsened when she appeared to be talking to herself and hallucinating.  Daughter brought her into the hospital for further evaluation.  Of note, patient had a very similar presentation when she was admitted to this [facility] in March after a fall/being found on the floor where she had been laying for a while and EMS found her to be hypothermic/confused.  At that time patient was alcohol dependent and was also grieving her husband's death in 2023-12-15.  She was treated for aspiration/sepsis syndrome with antibiotics although there was no clear source of infection and also for alcohol withdrawal with Librium taper.  Hypothermia and mental status were believed secondary to polypharmacy/alcohol use. She was subsequently discharged to rehab where she stayed until April 21.  In the rehab she was apparently treated for UTI and  given antibiotic course to complete at the time of discharge.  According to the daughter, few days after discharge patient developed dysuria for which she sought medical attention through PCP and was given 2 courses of antibiotic for resistant UTI.  She was beginning to do well earlier this month and was progressing with physical therapy, walking with a walker and was mentally clear earlier this month.  Daughter apparently has been staying with patient since her husband passed away and assisting with her medications.  According to the daughter patient has been having problems at night with sleep and anxiety.  Her Seroquel dose was increased from 100 mg to 200 mg nightly on June 3 by her psychiatrist.  She also takes Prozac Effexor and Ambien at baseline.  Per daughter, patient has been on Librium 10 mg twice daily since discharge from rehab center.  Daughter apparently was down with a sinus infection recently after which patient started experiencing ear fullness/discomfort and was referred to ENT as conservative management with Flonase and antihistamines did not work.  Daughter could not get ENT appointment until next week and patient presented to our ED on June 19 with complaints of earache.  She was prescribed Augmentin which she has been taking until yesterday.  In the ED today, patient is afebrile.  Vital signs are stable.  Labs within normal limits without any leukocytosis or AKI. CT head in the ED unremarkable.  Patient has been combative and fighting staff since being in the ED.  Her condition has improved after she received IV Haldol/Geodon.  She currently appears  somewhat better.  She was able to name herself, her daughter, stated this was June 2020 although when asked about place, she stated [place]  When asked about alcohol use, she stated "I like to drink Diet Coke and water".  Daughter confirmed that patient has not been drinking alcohol lately.      Review of Systems: As per HPI  otherwise 10 point review of systems negative.    Past Medical History:  Diagnosis Date   Alopecia    Anemia    hx of years ago    Anxiety    ARF (acute renal failure) ([place]) 08/23/2014   Arthritis    psoriatic arthritis   Back pain    Blood transfusion    at age of 4   Carotid stenosis 12/2016   Chronic diastolic CHF (congestive heart failure) ([place])    Depression    Dysrhythmia 11/13/2016   PAFib for a short time- Echo done 11/14/16 PAF- to follow up with PCP- to see if she is a candiate for anticoags.  Not started at times time due to alcohol abuse.   GERD (gastroesophageal reflux disease)    H/O hiatal hernia    History of acute cholangitis    History of GI diverticular bleed    Hyperlipidemia    Hypertension    Hypothyroidism    IBS (irritable bowel syndrome)    Pancreatitis    Peripheral vascular disease (HCC)    PONV (postoperative nausea and vomiting)    with Breast reduction- only time   Rosacea    Seizures ([place]) 11/13/2016   Thopught to be from withdrawl Benzodiazepine   Stroke [facility])    patient denies-   Thyroid disease     Past Surgical History:  Procedure Laterality Date   ABDOMINAL HYSTERECTOMY     partial   BACK SURGERY  2010   thoractic-screws and rods   BILE DUCT STENT PLACEMENT  08/26/2014   BREAST REDUCTION SURGERY     CHOLECYSTECTOMY  1988   lap   COLONOSCOPY     COLONOSCOPY Left 09/08/2014   Procedure: COLONOSCOPY;  Surgeon: Arta Silence, MD;  Location: [facility] ENDOSCOPY;  Service: Endoscopy;  Laterality: Left;   ENDARTERECTOMY Left 12/28/2016   ENDARTERECTOMY Left 12/28/2016   Procedure: ENDARTERECTOMY CAROTID LEFT WITH XENOSURE BIOLOGIC PATCH ANGIOPLASTY;  Surgeon: Angelia Mould, MD;  Location: [place];  Service: Vascular;  Laterality: Left;   ERCP     ERCP N/A 08/26/2014   Procedure: ENDOSCOPIC RETROGRADE CHOLANGIOPANCREATOGRAPHY (ERCP);  Surgeon: Jeryl Columbia, MD;  Location: [facility] ENDOSCOPY;  Service:  Endoscopy;  Laterality: N/A;   ERCP N/A 09/11/2014   Procedure: ENDOSCOPIC RETROGRADE CHOLANGIOPANCREATOGRAPHY (ERCP);  Surgeon: Arta Silence, MD;  Location: [facility] ENDOSCOPY;  Service: Endoscopy;  Laterality: N/A;   EUS N/A 09/11/2014   Procedure: ESOPHAGEAL ENDOSCOPIC ULTRASOUND (EUS) RADIAL;  Surgeon: Arta Silence, MD;  Location: [facility] ENDOSCOPY;  Service: Endoscopy;  Laterality: N/A;   EYE SURGERY Bilateral    Cataract   JOINT REPLACEMENT  2005   left shoulder replacement    ORIF CLAVICULAR FRACTURE Left 07/18/2014   Procedure: OPEN REDUCTION INTERNAL FIXATION (ORIF) LEFT CLAVICULAR FRACTURE;  Surgeon: Ninetta Lights, MD;  Location: [place];  Service: Orthopedics;  Laterality: Left;   SPYGLASS CHOLANGIOSCOPY N/A 09/11/2014   Procedure: FYBOFBPZ CHOLANGIOSCOPY;  Surgeon: Arta Silence, MD;  Location: [facility] ENDOSCOPY;  Service: Endoscopy;  Laterality: N/A;   TONSILLECTOMY  as child   TOTAL KNEE ARTHROPLASTY Left 03/10/2016  Procedure: LEFT TOTAL KNEE ARTHROPLASTY;  Surgeon: Ninetta Lights, MD;  Location: [place];  Service: Orthopedics;  Laterality: Left;   VENTRAL HERNIA REPAIR  06/08/2012   Procedure: LAPAROSCOPIC VENTRAL HERNIA;  Surgeon: Adin Hector, MD;  Location: [facility];  Service: General;  Laterality: Right;    Social history:  reports that she has never smoked. She has never used smokeless tobacco. She reports current alcohol use of about 7.0 standard drinks of alcohol per week. She reports that she does not use drugs.   Allergies  Allergen Reactions   Tetanus Toxoids Swelling and Other (See Comments)    Arm (site) became swollen as a child   Snake Antivenin [Antivenin Crotalidae Polyvalent] Other (See Comments)    Just can't take per daughter   Yellow Dyes (Non-Tartrazine) Itching and Other (See Comments)    Makes patient nervous     Family History  Problem Relation Age of Onset   Heart disease Mother    Cancer Father        lung   Bipolar  disorder Brother       Prior to Admission medications   Medication Sig Start Date End Date Taking? Authorizing Provider  acetaminophen (TYLENOL) 325 MG tablet Take 1-2 tablets (325-650 mg total) by mouth every 4 (four) hours as needed for mild pain. 04/03/19   Love, Ivan Anchors, PA-C  amoxicillin-clavulanate (AUGMENTIN) 875-125 MG tablet Take 1 tablet by mouth every 12 (twelve) hours. 05/29/19   Kendrick, Caitlyn S, PA-C  aspirin EC 81 MG tablet Take 81 mg by mouth daily.    [provider]  cephALEXin (KEFLEX) 250 MG capsule Take 1 capsule (250 mg total) by mouth every 8 (eight) hours. 04/03/19   Love, Ivan Anchors, PA-C  chlordiazePOXIDE (LIBRIUM) 5 MG capsule Take 1 capsule (5 mg total) by mouth 2 (two) times daily as needed for anxiety. 04/03/19   Love, Ivan Anchors, PA-C  FLUoxetine (PROZAC) 20 MG capsule Take 1 capsule (20 mg total) by mouth daily. 04/03/19   Love, Ivan Anchors, PA-C  fluticasone (FLONASE) 50 MCG/ACT nasal spray Place 1 spray into both nostrils daily. 04/04/19   Love, Ivan Anchors, PA-C  folic acid (FOLVITE) 1 MG tablet Take 1 tablet (1 mg total) by mouth daily. 04/03/19   Love, Ivan Anchors, PA-C  furosemide (LASIX) 20 MG tablet Take 1 tablet (20 mg total) by mouth daily. 04/03/19   Love, Ivan Anchors, PA-C  hydrALAZINE (APRESOLINE) 25 MG tablet Take 3 tablets (75 mg total) by mouth every 8 (eight) hours. 04/03/19   Love, Ivan Anchors, PA-C  hyoscyamine (LEVBID) 0.375 MG 12 hr tablet Take 1 tablet (0.375 mg total) by mouth every 12 (twelve) hours as needed for cramping. 04/03/19   Love, Ivan Anchors, PA-C  ketotifen (ZADITOR) 0.025 % ophthalmic solution Place 1 drop into both eyes 2 (two) times daily. 04/03/19   Love, Ivan Anchors, PA-C  levothyroxine (SYNTHROID) 100 MCG tablet Take 1 tablet (100 mcg total) by mouth daily at 6 (six) AM. 04/04/19   Love, Ivan Anchors, PA-C  metoprolol succinate (TOPROL-XL) 25 MG 24 hr tablet Take 3 tablets (75 mg total) by mouth daily. 04/04/19   Love, Ivan Anchors, PA-C  pantoprazole  (PROTONIX) 40 MG tablet Take 1 tablet (40 mg total) by mouth daily. Patient not taking: Reported on 04/24/2019 04/04/19   Love, Ivan Anchors, PA-C  QUEtiapine (SEROQUEL) 25 MG tablet Take 1 tablet (25 mg total) by mouth at bedtime. Patient taking differently: Take 25 mg  by mouth at bedtime. 2 50mg  qhs 04/03/19   Love, Ivan Anchors, PA-C  saccharomyces boulardii (FLORASTOR) 250 MG capsule Take 1 capsule (250 mg total) by mouth 2 (two) times daily. 04/03/19   Love, Ivan Anchors, PA-C  senna (SENOKOT) 8.6 MG TABS tablet Take 2 tablets (17.2 mg total) by mouth at bedtime. 04/03/19   Love, Ivan Anchors, PA-C  simvastatin (ZOCOR) 10 MG tablet Take 1 tablet (10 mg total) by mouth daily at 6 PM. 04/03/19   Love, Ivan Anchors, PA-C  thiamine 100 MG tablet Take 1 tablet (100 mg total) by mouth daily. 03/14/19   Thurnell Lose, MD  venlafaxine XR (EFFEXOR-XR) 37.5 MG 24 hr capsule Take 1 capsule (37.5 mg total) by mouth daily with breakfast. 04/04/19   Love, Ivan Anchors, PA-C  zolpidem (AMBIEN) 5 MG tablet Take 1 tablet (5 mg total) by mouth at bedtime as needed for sleep. 04/03/19   Bary Leriche, PA-C    Physical Exam: Vitals:   06/03/19 0424 06/03/19 0430 06/03/19 0500 06/03/19 0702  BP:  125/69 123/81   Pulse:  61 62 77  Resp:  (!) 22 15 20   Temp:      TempSrc:      SpO2:  94% 95% 94%  Weight: 74 kg     Height: 5' (1.524 m)       Constitutional: Early female was trying to get out of bed and appears to be confused Eyes: PERRL, lids and conjunctivae normal ENMT: Mucous membranes are moist. Posterior pharynx clear of any exudate or lesions.Normal dentition.  Neck: normal, supple, no masses, no thyromegaly Respiratory: clear to auscultation bilaterally, no wheezing, no crackles. Normal respiratory effort. No accessory muscle use.  Cardiovascular: Regular rate and rhythm, no murmurs / rubs / gallops. No extremity edema. 2+ pedal pulses. No carotid bruits.  Abdomen: no tenderness, no masses palpated. No hepatosplenomegaly.  Bowel sounds positive.  Musculoskeletal: no clubbing / cyanosis. No joint deformity upper and lower extremities. Good ROM, no contractures. Normal muscle tone.  Neurologic: Could not test in detail but moving all extremities.  No facial asymmetry noted.  Disoriented as described above.   Psychiatric: Impaired judgment and insight.  Awake and oriented x 2.  Agitated intermittently.  SKIN/catheters: no rashes, lesions, ulcers. No induration  Labs on Admission: I have personally reviewed following labs and imaging studies  CBC: Recent Labs  Lab 06/03/19 0420  WBC 8.0  NEUTROABS 5.7  HGB 11.0*  HCT 34.3*  MCV 94.0  PLT 371*   Basic Metabolic Panel: Recent Labs  Lab 06/03/19 0420  NA 139  K 3.9  CL 104  CO2 24  GLUCOSE 111*  BUN 30*  CREATININE 1.18*  CALCIUM 8.7*   GFR: Estimated Creatinine Clearance: 34.2 mL/min (A) (by C-G formula based on SCr of 1.18 mg/dL (H)). Liver Function Tests: Recent Labs  Lab 06/03/19 0420  AST 24  ALT 25  ALKPHOS 87  BILITOT 0.4  PROT 6.0*  ALBUMIN 3.1*   No results for input(s): LIPASE, AMYLASE in the last 168 hours. Recent Labs  Lab 06/03/19 0432  AMMONIA 36*   Coagulation Profile: No results for input(s): INR, PROTIME in the last 168 hours. Cardiac Enzymes: No results for input(s): CKTOTAL, CKMB, CKMBINDEX, TROPONINI in the last 168 hours. BNP (last 3 results) No results for input(s): PROBNP in the last 8760 hours. HbA1C: No results for input(s): HGBA1C in the last 72 hours. CBG: Recent Labs  Lab 06/03/19 0454  GLUCAP 102*  Lipid Profile: No results for input(s): CHOL, HDL, LDLCALC, TRIG, CHOLHDL, LDLDIRECT in the last 72 hours. Thyroid Function Tests: No results for input(s): TSH, T4TOTAL, FREET4, T3FREE, THYROIDAB in the last 72 hours. Anemia Panel: No results for input(s): VITAMINB12, FOLATE, FERRITIN, TIBC, IRON, RETICCTPCT in the last 72 hours. Urine analysis:    Component Value Date/Time   COLORURINE YELLOW  03/29/2019 1800   APPEARANCEUR CLEAR 03/29/2019 1800   LABSPEC 1.010 03/29/2019 1800   PHURINE 5.0 03/29/2019 1800   GLUCOSEU NEGATIVE 03/29/2019 1800   HGBUR SMALL (A) 03/29/2019 1800   BILIRUBINUR NEGATIVE 03/29/2019 1800   KETONESUR NEGATIVE 03/29/2019 1800   PROTEINUR 30 (A) 03/29/2019 1800   UROBILINOGEN 0.2 09/04/2014 1340   NITRITE POSITIVE (A) 03/29/2019 1800   LEUKOCYTESUR NEGATIVE 03/29/2019 1800    Radiological Exams on Admission: Ct Head Wo Contrast  Result Date: 06/03/2019 CLINICAL DATA:  Altered level of consciousness.  No reported injury. EXAM: CT HEAD WITHOUT CONTRAST TECHNIQUE: Contiguous axial images were obtained from the base of the skull through the vertex without intravenous contrast. COMPARISON:  03/29/2019 head CT. FINDINGS: Brain: No evidence of parenchymal hemorrhage or extra-axial fluid collection. No mass lesion, mass effect, or midline shift. No CT evidence of acute infarction. Nonspecific mild subcortical and periventricular white matter hypodensity, most in keeping with chronic small vessel ischemic change. Cerebral volume is age appropriate. No ventriculomegaly. Vascular: No acute abnormality. Skull: No evidence of calvarial fracture. Sinuses/Orbits: The visualized paranasal sinuses are essentially clear. Other: Near complete right mastoid effusion. Clear left mastoid air cells. IMPRESSION: 1.  No evidence of acute intracranial abnormality. 2. Mild chronic small vessel ischemic changes in cerebral white matter. 3. Near complete right mastoid effusion, nonspecific. Electronically Signed   By: Ilona Sorrel M.D.   On: 06/03/2019 05:07   Dg Chest Port 1 View  Result Date: 06/03/2019 CLINICAL DATA:  83 y/o  F; weakness. EXAM: PORTABLE CHEST 1 VIEW COMPARISON:  03/30/2019 chest radiograph. FINDINGS: Stable cardiomegaly given projection and technique. Plate and screw fixation of the left lateral clavicle. Left shoulder hemiarthroplasty. Thoracolumbar fusion apparatus,  partially visualized. Aortic atherosclerosis with calcification. Diffuse bronchitic changes. No focal consolidation. No pleural effusion or pneumothorax. No acute osseous abnormality is evident. IMPRESSION: Stable cardiomegaly. Diffuse bronchitic changes. No focal consolidation. Electronically Signed   By: Kristine Garbe M.D.   On: 06/03/2019 05:07    EKG: Independently reviewed.  Normal sinus rhythm with PACs, QTC 433 ms     Assessment and Plan:   1.Altered mental status: Metabolic encephalopathy secondary to fall/concussion/underlying infection versus toxic encephalopathy secondary to polypharmacy. No focal neurological deficit. CT scan of the head negative except for chronic small vessel ischemic changes and also reported nonspecific near complete right mastoid effusion.  Hold benzodiazepines including Librium (not sure if this was meant to be tapered to off) and Ambien.  Will change Seroquel 100 mg twice daily.  Can resume Prozac, Effexor when able to take p.o.  Will utilize Haldol as needed for agitation.  EKG shows QTC 433 ms.  Will give Unasyn for possible mastoiditis given recent complaints of earache and CT findings, although no evidence of fever or white count.  Will obtain MRI to rule out acute stroke when patient calmer and able to tolerate.  Will keep n.p.o. given aspiration risk and obtain swallow evaluation.  Will change some of her medications to IV.  2.  Hypertension: Patient's blood pressure currently systolic 1 38-2 50N.  Will hold Lasix and change oral metoprolol  to IV given n.p.o. status.   3. Chronic diastolic heart failure: Appears compensated.  Last EF 60%.  Hold Lasix given n.p.o. status and need for IV hydration.  Lasix was held in last admission and concern for AKI but was resumed during the hospital course as patient did develop some pedal edema.  Watch closely on IV fluids and resume diuretics when able to take p.o.  Or if signs of acute CHF.  4.  History of  CVA: Resume aspirin and statins.  Will obtain MRI when patient able to cooperate.  Currently moving all extremities.  Walks with a walker at baseline.  5.Paroxysmal atrial fibrillation:Resume beta-blockers (at lower dose as described above).  Will have IV Lopressor PRN available as patient currently n.p.o.Mali vas 2 score is 3 but she is not on any anticoagulation due to fall risk. On baby aspirin.  6.  Anxiety/depression: Resume psych medications Prozac, Seroquel and Effexor as described above.  Hold Librium and Ambien.  7.  Hypothyroidism: Ordered IV Synthroid as patient n.p.o.    DVT prophylaxis: Lovenox  Code Status: Full code as confirmed with daughter  Family Communication: Discussed with daughter [name] [name] who is her healthcare proxy Consults called: None at this time Admission status:  I certify that at the point of admission it is my clinical judgment that the patient will require inpatient hospital care spanning beyond 2 midnights from the point of admission due to high intensity of service and high frequency of surveillance required.Inpatient status is judged to be reasonable and necessary in order to provide the required intensity of service to ensure the patient's safety. The patient's presenting symptoms, physical exam findings, and initial radiographic and laboratory data in the context of their chronic comorbidities is felt to place them at high risk for further clinical deterioration. The following factors support the patient status of inpatient.   Acute metabolic encephalopathy requiring further work-up, n.p.o. status, IV fluids and IV medication/antipsychotics  Guilford Shi MD [facility] Pager [phone]  If 7PM-7AM, please contact night-coverage [email or web address] Password [facility]  06/03/2019, 8:28 AM

## 2019-06-03 NOTE — ED Notes (Signed)
Message to attending MD regarding continued patient agitation.  She will come to ED to eval.
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
## 2019-06-03 NOTE — ED Provider Notes (Signed)
Patient has had mental status change.  Patient's plan is for admission to medical service.  Anticipated admission. Physical Exam  BP 123/81   Pulse 77   Temp (!) 97.4 F (36.3 C) (Oral)   Resp 20   Ht 5' (1.524 m)   Wt 74 kg   LMP  (Exact Date)   SpO2 94%   BMI 31.86 kg/m   Physical Exam  ED Course/Procedures     Procedures  MDM  Patient admitted without requiring further intervention on my part.       Charlesetta Shanks, MD 06/04/19 [phone]

## 2019-06-03 NOTE — ED Triage Notes (Signed)
Pt BIB [facility] for altered mental status. EMS reports pt's daughter advised that at midnight the pt reported altered mental status. Family told the pt that she has not been diagnosed with dementia or alzheimer's. EMS advised the pt is redirectable but is definitely altered. EMS reports the pt was recently taken off the seroquel for roughly about a week now. EMS reports upper bilateral arm tremors. EMS reports IV established, vital signs stable, 12 lead unremarkable.   Pt reports nothing wrong. Pt does have scattered bruising across her body.

## 2019-06-03 NOTE — ED Notes (Signed)
Pt remains agitated and physically grabbing at staff.  Geodon given without incident with another RN assist.  Bed remains in position to prevent pt from exiting with head lowered.  Padding to bilateral side rails.  Speech clear with flight of thought.  MAE equal and active.

## 2019-06-03 NOTE — ED Notes (Signed)
Pt continues to get agitated and is attempting to get out of the bed in anyway possible.

## 2019-06-03 NOTE — ED Notes (Signed)
Family at bedside. 

## 2019-06-03 NOTE — ED Notes (Signed)
Pt pulled all wires and equipment off.

## 2019-06-04 DIAGNOSIS — H7091 Unspecified mastoiditis, right ear: Secondary | ICD-10-CM

## 2019-06-04 DIAGNOSIS — F329 Major depressive disorder, single episode, unspecified: Secondary | ICD-10-CM

## 2019-06-04 DIAGNOSIS — R4182 Altered mental status, unspecified: Secondary | ICD-10-CM

## 2019-06-04 LAB — BASIC METABOLIC PANEL
Anion gap: 9 (ref 5–15)
BUN: 20 mg/dL (ref 8–23)
CO2: 24 mmol/L (ref 22–32)
Calcium: 8.7 mg/dL — ABNORMAL LOW (ref 8.9–10.3)
Chloride: 109 mmol/L (ref 98–111)
Creatinine, Ser: 1.06 mg/dL — ABNORMAL HIGH (ref 0.44–1.00)
GFR calc Af Amer: 57 mL/min — ABNORMAL LOW (ref >60)
GFR calc non Af Amer: 50 mL/min — ABNORMAL LOW (ref >60)
Glucose, Bld: 99 mg/dL (ref 70–99)
Potassium: 3.6 mmol/L (ref 3.5–5.1)
Sodium: 142 mmol/L (ref 135–145)

## 2019-06-04 LAB — CBC
HCT: 32.7 % — ABNORMAL LOW (ref 36.0–46.0)
Hemoglobin: 10.3 g/dL — ABNORMAL LOW (ref 12.0–15.0)
MCH: 29.3 pg (ref 26.0–34.0)
MCHC: 31.5 g/dL (ref 30.0–36.0)
MCV: 93.2 fL (ref 80.0–100.0)
Platelets: 126 10*3/uL — ABNORMAL LOW (ref 150–400)
RBC: 3.51 MIL/uL — ABNORMAL LOW (ref 3.87–5.11)
RDW: 13.3 % (ref 11.5–15.5)
WBC: 8 10*3/uL (ref 4.0–10.5)
nRBC: 0 % (ref 0.0–0.2)

## 2019-06-04 MED ORDER — LEVOTHYROXINE SODIUM 100 MCG PO TABS
100.0000 ug | ORAL_TABLET | Freq: Every day | ORAL | Status: DC
  Administered 2019-06-05 – 2019-06-06 (×2): 100 ug via ORAL
  Filled 2019-06-04 (×2): qty 1

## 2019-06-04 MED ORDER — SODIUM CHLORIDE 0.9 % IV SOLN
1.5000 g | Freq: Four times a day (QID) | INTRAVENOUS | Status: DC
  Administered 2019-06-04 – 2019-06-06 (×11): 1.5 g via INTRAVENOUS
  Filled 2019-06-04 (×14): qty 1.5

## 2019-06-04 MED ORDER — METOPROLOL SUCCINATE ER 50 MG PO TB24
50.0000 mg | ORAL_TABLET | Freq: Every day | ORAL | Status: DC
  Administered 2019-06-05 – 2019-06-06 (×2): 50 mg via ORAL
  Filled 2019-06-04 (×2): qty 1

## 2019-06-04 MED ORDER — METOPROLOL TARTRATE 5 MG/5ML IV SOLN: 2.5000 mg | Freq: Three times a day (TID) | INTRAVENOUS | Status: DC | PRN

## 2019-06-04 NOTE — Evaluation (Signed)
Clinical/Bedside Swallow Evaluation Patient Details  Name: [name] [name] MRN: [id number] Date of Birth: October 03, 1936  Today's Date: 06/04/2019 Time: SLP Start Time (ACUTE ONLY): 1115 SLP Stop Time (ACUTE ONLY): 1135 SLP Time Calculation (min) (ACUTE ONLY): 20 min  Past Medical History:  Past Medical History:  Diagnosis Date  . Alopecia   . Anemia    hx of years ago   . Anxiety   . ARF (acute renal failure) ([place]) 08/23/2014  . Arthritis    psoriatic arthritis  . Back pain   . Blood transfusion    at age of 4  . Carotid stenosis 12/2016  . Chronic diastolic CHF (congestive heart failure) ([place])   . Depression   . Dysrhythmia 11/13/2016   PAFib for a short time- Echo done 11/14/16 PAF- to follow up with PCP- to see if she is a candiate for anticoags.  Not started at times time due to alcohol abuse.  [name] GERD (gastroesophageal reflux disease)   . H/O hiatal hernia   . History of acute cholangitis   . History of GI diverticular bleed   . Hyperlipidemia   . Hypertension   . Hypothyroidism   . IBS (irritable bowel syndrome)   . Pancreatitis   . Peripheral vascular disease ([place])   . PONV (postoperative nausea and vomiting)    with Breast reduction- only time  . Rosacea   . Seizures ([place]) 11/13/2016   Thopught to be from withdrawl Benzodiazepine  . Stroke [facility])    patient denies-  . Thyroid disease    Past Surgical History:  Past Surgical History:  Procedure Laterality Date  . ABDOMINAL HYSTERECTOMY     partial  . BACK SURGERY  2010   thoractic-screws and rods  . BILE DUCT STENT PLACEMENT  08/26/2014  . BREAST REDUCTION SURGERY    . CHOLECYSTECTOMY  1988   lap  . COLONOSCOPY    . COLONOSCOPY Left 09/08/2014   Procedure: COLONOSCOPY;  Surgeon: Arta Silence, MD;  Location: [facility] ENDOSCOPY;  Service: Endoscopy;  Laterality: Left;  . ENDARTERECTOMY Left 12/28/2016  . ENDARTERECTOMY Left 12/28/2016   Procedure: ENDARTERECTOMY CAROTID LEFT WITH XENOSURE BIOLOGIC PATCH ANGIOPLASTY;   Surgeon: Angelia Mould, MD;  Location: [place];  Service: Vascular;  Laterality: Left;  . ERCP    . ERCP N/A 08/26/2014   Procedure: ENDOSCOPIC RETROGRADE CHOLANGIOPANCREATOGRAPHY (ERCP);  Surgeon: Jeryl Columbia, MD;  Location[facility] ENDOSCOPY;  Service: Endoscopy;  Laterality: N/A;  . ERCP N/A 09/11/2014   Procedure: ENDOSCOPIC RETROGRADE CHOLANGIOPANCREATOGRAPHY (ERCP);  Surgeon: Arta Silence, MD;  Location: [facility] ENDOSCOPY;  Service: Endoscopy;  Laterality: N/A;  . EUS N/A 09/11/2014   Procedure: ESOPHAGEAL ENDOSCOPIC ULTRASOUND (EUS) RADIAL;  Surgeon: Arta Silence, MD;  Location: [facility] ENDOSCOPY;  Service: Endoscopy;  Laterality: N/A;  . EYE SURGERY Bilateral    Cataract  . JOINT REPLACEMENT  2005   left shoulder replacement   . ORIF CLAVICULAR FRACTURE Left 07/18/2014   Procedure: OPEN REDUCTION INTERNAL FIXATION (ORIF) LEFT CLAVICULAR FRACTURE;  Surgeon: Ninetta Lights, MD;  Location: [place];  Service: Orthopedics;  Laterality: Left;  . SPYGLASS CHOLANGIOSCOPY N/A 09/11/2014   Procedure: SPYGLASS CHOLANGIOSCOPY;  Surgeon: Arta Silence, MD;  Location: [facility] ENDOSCOPY;  Service: Endoscopy;  Laterality: N/A;  . TONSILLECTOMY  as child  . TOTAL KNEE ARTHROPLASTY Left 03/10/2016   Procedure: LEFT TOTAL KNEE ARTHROPLASTY;  Surgeon: Ninetta Lights, MD;  Location: [place];  Service: Orthopedics;  Laterality: Left;  [name] VENTRAL HERNIA REPAIR  06/08/2012   Procedure: LAPAROSCOPIC VENTRAL HERNIA;  Surgeon: Adin Hector, MD;  Location: [facility] ORS;  Service: General;  Laterality: Right;   HPI:  Patient is an 83 y.o. female with PMH: CVA, CAD, paroxysmal afib, HTN, diastolic CHF, anxiety, depression on multiple psychiatric medications, hypothyroidism, who was brought to the hospital for AMS and unwittnessed fall. CT head did not reveal any acute intracranial abnormalities and CXR did not reveal any consolidation, pleural effusion or pneumothorax.   Assessment / Plan / Recommendation Clinical  Impression  Patient presents with a mild oropharyngeal dysphagia with decreased mastication and oral transit of regular solids, but without overt s/s of aspiration or penetration with thin liquids via straw sips or with puree solids. Of note, patient had an MBS on 04/02/19 during recent inpatient stay and swallow function was found to be [facility] with mild swallow initiation delays. SLP Visit Diagnosis: Dysphagia, unspecified (R13.10)    Aspiration Risk  Mild aspiration risk    Diet Recommendation Dysphagia 3 (Mech soft);Thin liquid   Liquid Administration via: Straw;Cup Medication Administration: Whole meds with puree Supervision: Patient able to self feed;Intermittent supervision to cue for compensatory strategies Compensations: Minimize environmental distractions;Slow rate;Small sips/bites Postural Changes: Seated upright at 90 degrees    Other  Recommendations Oral Care Recommendations: Oral care BID   Follow up Recommendations None      Frequency and Duration min 1 x/week  1 week       Prognosis Prognosis for Safe Diet Advancement: Good      Swallow Study   General Date of Onset: 06/03/19 HPI: Patient is an 83 y.o. female with PMH: CVA, CAD, paroxysmal afib, HTN, diastolic CHF, anxiety, depression on multiple psychiatric medications, hypothyroidism, who was brought to the hospital for AMS and unwittnessed fall. CT head did not reveal any acute intracranial abnormalities and CXR did not reveal any consolidation, pleural effusion or pneumothorax. Type of Study: Bedside Swallow Evaluation Previous Swallow Assessment: remote MBS in 04/02/19 which revealed largely [facility] swallow Diet Prior to this Study: NPO Temperature Spikes Noted: No History of Recent Intubation: No Behavior/Cognition: Alert;Cooperative;Pleasant mood Oral Cavity Assessment: Within Functional Limits Oral Care Completed by SLP: Yes Oral Cavity - Dentition: Adequate natural dentition Vision: Functional for  self-feeding Self-Feeding Abilities: Able to feed self;Needs set up Patient Positioning: Upright in bed Baseline Vocal Quality: Normal Volitional Cough: Strong Volitional Swallow: Unable to elicit    Oral/Motor/Sensory Function Overall Oral Motor/Sensory Function: Within functional limits   Ice Chips     Thin Liquid Thin Liquid: Within functional limits Presentation: Straw;Cup;Self Fed    Nectar Thick     Honey Thick     Puree Puree: Within functional limits Presentation: Self Fed;Spoon   Solid     Solid: Impaired Oral Phase Impairments: Impaired mastication Oral Phase Functional Implications: Prolonged oral transit;Impaired mastication;Oral residue      Nadara Mode Tarrell 06/04/2019,5:25 PM    Sonia Baller, MA, CCC-SLP Speech Therapy [facility] Acute Rehab Pager: [phone]

## 2019-06-04 NOTE — Progress Notes (Signed)
Patient ID: [name] [name], female   DOB: 08/26/1936, 83 y.o.   MRN: [id number]  PROGRESS NOTE    [name] [name]  WUJ:[id number] DOB: [birth year] DOA: 06/03/2019 PCP: [name], MD   Brief Narrative:  83 year old female with history of CVA, CAD, paroxysmal A. fib not on anticoagulation secondary to falls, hypertension, chronic diastolic CHF, anxiety/depression on multiple psychiatric medications, hypothyroidism presented with altered mental status on 06/03/2019.  Patient is on multiple psychiatric medications.  In the ED, CT of the head was unremarkable.  Patient was combative and fighting staff in the ED.  Assessment & Plan:   Acute metabolic encephalopathy versus toxic encephalopathy Anxiety/depression on multiple psychiatric medications Right mastoid effusion -Patient presented with altered mental status.  Unclear source.  Could be secondary to polypharmacy as patient is on multiple psychiatric medications. -CT of the head was unremarkable except for near complete right mastoid effusion on presentation.  Patient is still confused.  Will get MRI of the brain. -Empirically started on Unasyn for probable mastoiditis.  Will continue same. -SLP evaluation and diet accordingly. -Psychiatry consultation as patient is on multiple medications.  Benzodiazepines including Librium held along with Ambien.  Seroquel dose has been decreased to 100 mg at night, apparently dose had been increased to 200 mg recently. -Fall precautions. -Neurochecks -Check vitamin B12, folate, TSH.  Hypertension  -Blood pressure on the high side.  Monitor.  Will restart oral antihypertensive regimen if able to swallow  Chronic diastolic heart failure -Compensated.  Echo in 09/18/2018 had shown EF of 60 to 65% -Lasix on hold for now.  Strict input output.  Daily weights.  History of CVA -No signs of focal neurologic.  Continue aspirin and statin. -Follow MRI -PT/OT eval  Paroxysmal A. fib -Currently rate controlled.   Getting scheduled IV Lopressor.  Continue aspirin.  Not on anticoagulation as an outpatient because of history of falls  Hypothyroidism  -Continue Synthroid.  Generalized deconditioning -Overall prognosis is guarded to poor.  Patient is on multiple medications for her psychiatric condition.  Still full code.  Will request palliative care evaluation for goals of care discussion.  DVT prophylaxis: Lovenox Code Status: Full Family Communication: Spoke to [name] [name]/daughter on phone on 06/04/2019 Disposition Plan: Home in 2 to 3 days if clinically improves  Consultants: Psychiatry  Procedures: None  Antimicrobials: None   Subjective: Patient seen and examined at bedside.  She is awake.  States that she does not want to tell her name to me.  She does not want to talk to me.  States that she came to the hospital to get away from the daughter.  No overnight fever reported.  Objective: Vitals:   06/03/19 2125 06/04/19 0215 06/04/19 0542 06/04/19 1424  BP: (!) 186/72 (!) 149/64 (!) 158/72 (!) 171/152  Pulse: 61 70 76 75  Resp: 19 16 16 18   Temp: 98.6 F (37 C) (!) 97.5 F (36.4 C) 98.3 F (36.8 C) 98.1 F (36.7 C)  TempSrc: Oral Oral Oral Oral  SpO2: 100% 96% 95% 93%  Weight:      Height:        Intake/Output Summary (Last 24 hours) at 06/04/2019 1518 Last data filed at 06/04/2019 9562 Gross per 24 hour  Intake 1027.73 ml  Output -  Net 1027.73 ml   Filed Weights   06/03/19 0424  Weight: 74 kg    Examination:  General exam: Elderly female.  Lying in bed.  Awake but slightly confused.  Does not want to participate  in conversation.  Does not want to talk to me. Respiratory system: Bilateral decreased breath sounds at bases with scattered crackles Cardiovascular system: S1 & S2 heard, Rate controlled Gastrointestinal system: Abdomen is nondistended, soft and nontender. Normal bowel sounds heard. Extremities: No cyanosis, clubbing; trace edema Central nervous system:  Answering only a few questions.  Confused.  No focal neurological deficits. Moving extremities Skin: No rashes, lesions or ulcers Psychiatry: Could not be assessed because of mental status.    Data Reviewed: I have personally reviewed following labs and imaging studies  CBC: Recent Labs  Lab 06/03/19 0420 06/03/19 1015 06/04/19 0237  WBC 8.0 8.1 8.0  NEUTROABS 5.7  --   --   HGB 11.0* 10.5* 10.3*  HCT 34.3* 34.1* 32.7*  MCV 94.0 96.6 93.2  PLT 135* 106* 756*   Basic Metabolic Panel: Recent Labs  Lab 06/03/19 0420 06/03/19 1015 06/04/19 0237  NA 139  --  142  K 3.9  --  3.6  CL 104  --  109  CO2 24  --  24  GLUCOSE 111*  --  99  BUN 30*  --  20  CREATININE 1.18* 1.10* 1.06*  CALCIUM 8.7*  --  8.7*   GFR: Estimated Creatinine Clearance: 38 mL/min (A) (by C-G formula based on SCr of 1.06 mg/dL (H)). Liver Function Tests: Recent Labs  Lab 06/03/19 0420  AST 24  ALT 25  ALKPHOS 87  BILITOT 0.4  PROT 6.0*  ALBUMIN 3.1*   No results for input(s): LIPASE, AMYLASE in the last 168 hours. Recent Labs  Lab 06/03/19 0432  AMMONIA 36*   Coagulation Profile: No results for input(s): INR, PROTIME in the last 168 hours. Cardiac Enzymes: No results for input(s): CKTOTAL, CKMB, CKMBINDEX, TROPONINI in the last 168 hours. BNP (last 3 results) No results for input(s): PROBNP in the last 8760 hours. HbA1C: No results for input(s): HGBA1C in the last 72 hours. CBG: Recent Labs  Lab 06/03/19 0454  GLUCAP 102*   Lipid Profile: No results for input(s): CHOL, HDL, LDLCALC, TRIG, CHOLHDL, LDLDIRECT in the last 72 hours. Thyroid Function Tests: No results for input(s): TSH, T4TOTAL, FREET4, T3FREE, THYROIDAB in the last 72 hours. Anemia Panel: No results for input(s): VITAMINB12, FOLATE, FERRITIN, TIBC, IRON, RETICCTPCT in the last 72 hours. Sepsis Labs: Recent Labs  Lab 06/03/19 0432  LATICACIDVEN 0.9    No results found for this or any previous visit (from the  past 240 hour(s)).       Radiology Studies: Ct Head Wo Contrast  Result Date: 06/03/2019 CLINICAL DATA:  Altered level of consciousness.  No reported injury. EXAM: CT HEAD WITHOUT CONTRAST TECHNIQUE: Contiguous axial images were obtained from the base of the skull through the vertex without intravenous contrast. COMPARISON:  03/29/2019 head CT. FINDINGS: Brain: No evidence of parenchymal hemorrhage or extra-axial fluid collection. No mass lesion, mass effect, or midline shift. No CT evidence of acute infarction. Nonspecific mild subcortical and periventricular white matter hypodensity, most in keeping with chronic small vessel ischemic change. Cerebral volume is age appropriate. No ventriculomegaly. Vascular: No acute abnormality. Skull: No evidence of calvarial fracture. Sinuses/Orbits: The visualized paranasal sinuses are essentially clear. Other: Near complete right mastoid effusion. Clear left mastoid air cells. IMPRESSION: 1.  No evidence of acute intracranial abnormality. 2. Mild chronic small vessel ischemic changes in cerebral white matter. 3. Near complete right mastoid effusion, nonspecific. Electronically Signed   By: Ilona Sorrel M.D.   On: 06/03/2019 05:07  Dg Chest Port 1 View  Result Date: 06/03/2019 CLINICAL DATA:  83 y/o  F; weakness. EXAM: PORTABLE CHEST 1 VIEW COMPARISON:  03/30/2019 chest radiograph. FINDINGS: Stable cardiomegaly given projection and technique. Plate and screw fixation of the left lateral clavicle. Left shoulder hemiarthroplasty. Thoracolumbar fusion apparatus, partially visualized. Aortic atherosclerosis with calcification. Diffuse bronchitic changes. No focal consolidation. No pleural effusion or pneumothorax. No acute osseous abnormality is evident. IMPRESSION: Stable cardiomegaly. Diffuse bronchitic changes. No focal consolidation. Electronically Signed   By: Kristine Garbe M.D.   On: 06/03/2019 05:07        Scheduled Meds: . aspirin EC  81 mg  Oral Daily  . enoxaparin (LOVENOX) injection  40 mg Subcutaneous Q24H  . FLUoxetine  20 mg Oral Daily  . fluticasone  1 spray Each Nare Daily  . folic acid  1 mg Oral Daily  . hydrALAZINE  75 mg Oral Q8H  . ketotifen  1 drop Both Eyes BID  . levothyroxine  25 mcg Intravenous QAC breakfast  . metoprolol tartrate  2.5 mg Intravenous Q8H  . QUEtiapine  100 mg Oral QHS  . simvastatin  10 mg Oral q1800  . thiamine  100 mg Oral Daily  . venlafaxine XR  37.5 mg Oral Q breakfast   Continuous Infusions: . sodium chloride 75 mL/hr at 06/04/19 0651  . ampicillin-sulbactam (UNASYN) IV 1.5 g (06/04/19 1455)     LOS: 1 day        Aline August, MD [facility] 06/04/2019, 3:18 PM

## 2019-06-04 NOTE — Progress Notes (Signed)
Spoke with Pt's daughter [name], gave update. Also took phone into room, on speaker. Pt was able to communicate with her daughter via phone. All questions/concerns addressed.

## 2019-06-04 NOTE — Consult Note (Addendum)
Telepsych Consultation   Reason for Consult:  Altered mental status Referring Physician:  Dr. Starla Link Location of Patient:  Location of Provider: [facility]  Patient Identification: [name] MRN:  [id number] Principal Diagnosis: <principal problem not specified> Diagnosis:  Active Problems:   Metabolic encephalopathy   Total Time spent with patient: 20 minutes  Subjective:   [name] [name] is a 83 y.o. female patient reports today that she is feeling much better. She states that she doesn't remember what happened to get to the hosiptal, but that her ears have been bothering her. She denies any suicidal or homicidal ideations. She reports that she remembers having some hallucinations the day that she came to the hospital, but hasn't had any since. She keeps saying that she just feels better.  Patient reports that she does have a history of depression and in her 38s she had to be hospitalized because of her depression after her father died.  She does report that her husband passed away this past Jan 01, 2024 and that things have been rough but she states that she still does not remember exactly what happened for her to come to the hospital. She reports that she is being tapered off of her Effexor XR. Sge states that she has been at Effexor XR 37.5 mg Daily for weeks.  Objective: Patient presents in her bed and appears to be pleasant, calm, and cooperative.  Patient is alert and is oriented to person, place, and time.  Patient carries on a very logical conversation when she is speaking with me.  Patient makes no odd comments or has any bizarre behavior.  I did speak with the nurse and the nurse reported that the patient has been acting more normal since this morning without any bizarre behavior or comments.  I evaluated this patient face-to-face and I have consulted with Dr. Dwyane Dee.  At this time the patient does not meet inpatient criteria and is psychiatrically cleared.  HPI:  Patient is an 83 year old female with a history of CVA, CAD, proximal atrial fibrillation, hypertension, diastolic CHF, anxiety and depression, hypothyroidism who presented to the hospital with altered mental status.  It was reported that the patient was complaining of some ear fullness and pain.  The patient was living with her daughter the daughter stepped out and came back and found the patient lying on the floor.  She is unsure if the patient did hit her head.  The patient has been reported to having hallucinations and some altered mental status.  Past Psychiatric History: Anxiety, depression  Risk to Self:   Risk to Others:   Prior Inpatient Therapy:   Prior Outpatient Therapy:    Past Medical History:  Past Medical History:  Diagnosis Date  . Alopecia   . Anemia    hx of years ago   . Anxiety   . ARF (acute renal failure) ([place]) 08/23/2014  . Arthritis    psoriatic arthritis  . Back pain   . Blood transfusion    at age of 4  . Carotid stenosis 12/2016  . Chronic diastolic CHF (congestive heart failure) ([place])   . Depression   . Dysrhythmia 11/13/2016   PAFib for a short time- Echo done 11/14/16 PAF- to follow up with PCP- to see if she is a candiate for anticoags.  Not started at times time due to alcohol abuse.  [name] GERD (gastroesophageal reflux disease)   . H/O hiatal hernia   . History of acute cholangitis   .  History of GI diverticular bleed   . Hyperlipidemia   . Hypertension   . Hypothyroidism   . IBS (irritable bowel syndrome)   . Pancreatitis   . Peripheral vascular disease ([place])   . PONV (postoperative nausea and vomiting)    with Breast reduction- only time  . Rosacea   . Seizures ([place]) 11/13/2016   Thopught to be from withdrawl Benzodiazepine  . Stroke [facility])    patient denies-  . Thyroid disease     Past Surgical History:  Procedure Laterality Date  . ABDOMINAL HYSTERECTOMY     partial  . BACK SURGERY  2010   thoractic-screws and rods  . BILE DUCT STENT  PLACEMENT  08/26/2014  . BREAST REDUCTION SURGERY    . CHOLECYSTECTOMY  1988   lap  . COLONOSCOPY    . COLONOSCOPY Left 09/08/2014   Procedure: COLONOSCOPY;  Surgeon: Arta Silence, MD;  [facility];  Service: Endoscopy;  Laterality: Left;  . ENDARTERECTOMY Left 12/28/2016  . ENDARTERECTOMY Left 12/28/2016   Procedure: ENDARTERECTOMY CAROTID LEFT WITH XENOSURE BIOLOGIC PATCH ANGIOPLASTY;  Surgeon: Angelia Mould, MD;  Location: [place];  Service: Vascular;  Laterality: Left;  . ERCP    . ERCP N/A 08/26/2014   Procedure: ENDOSCOPIC RETROGRADE CHOLANGIOPANCREATOGRAPHY (ERCP);  Surgeon: Jeryl Columbia, MD;  Location: [facility] ENDOSCOPY;  Service: Endoscopy;  Laterality: N/A;  . ERCP N/A 09/11/2014   Procedure: ENDOSCOPIC RETROGRADE CHOLANGIOPANCREATOGRAPHY (ERCP);  Surgeon: Arta Silence, MD;  Location: [facility] ENDOSCOPY;  Service: Endoscopy;  Laterality: N/A;  . EUS N/A 09/11/2014   Procedure: ESOPHAGEAL ENDOSCOPIC ULTRASOUND (EUS) RADIAL;  Surgeon: Arta Silence, MD;  Location: [facility] ENDOSCOPY;  Service: Endoscopy;  Laterality: N/A;  . EYE SURGERY Bilateral    Cataract  . JOINT REPLACEMENT  2005   left shoulder replacement   . ORIF CLAVICULAR FRACTURE Left 07/18/2014   Procedure: OPEN REDUCTION INTERNAL FIXATION (ORIF) LEFT CLAVICULAR FRACTURE;  Surgeon: Ninetta Lights, MD;  Location: [place];  Service: Orthopedics;  Laterality: Left;  . SPYGLASS CHOLANGIOSCOPY N/A 09/11/2014   Procedure: SPYGLASS CHOLANGIOSCOPY;  Surgeon: Arta Silence, MD;  Location: [facility] ENDOSCOPY;  Service: Endoscopy;  Laterality: N/A;  . TONSILLECTOMY  as child  . TOTAL KNEE ARTHROPLASTY Left 03/10/2016   Procedure: LEFT TOTAL KNEE ARTHROPLASTY;  Surgeon: Ninetta Lights, MD;  Location: [place];  Service: Orthopedics;  Laterality: Left;  [name] VENTRAL HERNIA REPAIR  06/08/2012   Procedure: LAPAROSCOPIC VENTRAL HERNIA;  Surgeon: Adin Hector, MD;  Location: [facility] ORS;  Service: General;  Laterality: Right;    Family History:  Family History  Problem Relation Age of Onset  . Heart disease Mother   . Cancer Father        lung  . Bipolar disorder Brother    Family Psychiatric  History: Brother diagnosed with bipolar Social History:  Social History   Substance and Sexual Activity  Alcohol Use Yes  . Alcohol/week: 7.0 standard drinks  . Types: 7 Glasses of wine per week   Comment: rare     Social History   Substance and Sexual Activity  Drug Use No  . Types: Benzodiazepines    Social History   Socioeconomic History  . Marital status: Married    Spouse name: Not on file  . Number of children: Not on file  . Years of education: Not on file  . Highest education level: Not on file  Occupational History  . Not on file  Social Needs  . Financial  resource strain: Not on file  . Food insecurity    Worry: Not on file    Inability: Not on file  . Transportation needs    Medical: Not on file    Non-medical: Not on file  Tobacco Use  . Smoking status: Never Smoker  . Smokeless tobacco: Never Used  Substance and Sexual Activity  . Alcohol use: Yes    Alcohol/week: 7.0 standard drinks    Types: 7 Glasses of wine per week    Comment: rare  . Drug use: No    Types: Benzodiazepines  . Sexual activity: Not Currently  Lifestyle  . Physical activity    Days per week: Not on file    Minutes per session: Not on file  . Stress: Not on file  Relationships  . Social Herbalist on phone: Not on file    Gets together: Not on file    Attends religious service: Not on file    Active member of club or organization: Not on file    Attends meetings of clubs or organizations: Not on file    Relationship status: Not on file  Other Topics Concern  . Not on file  Social History Narrative  . Not on file   Additional Social History:    Allergies:   Allergies  Allergen Reactions  . Tetanus Toxoids Swelling and Other (See Comments)    Arm (site) became swollen as a child  .  Snake Antivenin [Antivenin Crotalidae Polyvalent] Other (See Comments)    Just can't take per daughter  . Yellow Dyes (Non-Tartrazine) Itching and Other (See Comments)    Makes patient nervous     Labs:  Results for orders placed or performed during the hospital encounter of 06/03/19 (from the past 48 hour(s))  Comprehensive metabolic panel     Status: Abnormal   Collection Time: 06/03/19  4:20 AM  Result Value Ref Range   Sodium 139 135 - 145 mmol/L   Potassium 3.9 3.5 - 5.1 mmol/L   Chloride 104 98 - 111 mmol/L   CO2 24 22 - 32 mmol/L   Glucose, Bld 111 (H) 70 - 99 mg/dL   BUN 30 (H) 8 - 23 mg/dL   Creatinine, Ser 1.18 (H) 0.44 - 1.00 mg/dL   Calcium 8.7 (L) 8.9 - 10.3 mg/dL   Total Protein 6.0 (L) 6.5 - 8.1 g/dL   Albumin 3.1 (L) 3.5 - 5.0 g/dL   AST 24 15 - 41 U/L   ALT 25 0 - 44 U/L   Alkaline Phosphatase 87 38 - 126 U/L   Total Bilirubin 0.4 0.3 - 1.2 mg/dL   GFR calc non Af Amer 44 (L) >60 mL/min   GFR calc Af Amer 50 (L) >60 mL/min   Anion gap 11 5 - 15    Comment: Performed at [facility], [address] [place], [place] [address]  CBC WITH DIFFERENTIAL     Status: Abnormal   Collection Time: 06/03/19  4:20 AM  Result Value Ref Range   WBC 8.0 4.0 - 10.5 K/uL   RBC 3.65 (L) 3.87 - 5.11 MIL/uL   Hemoglobin 11.0 (L) 12.0 - 15.0 g/dL   HCT 34.3 (L) 36.0 - 46.0 %   MCV 94.0 80.0 - 100.0 fL   MCH 30.1 26.0 - 34.0 pg   MCHC 32.1 30.0 - 36.0 g/dL   RDW 13.2 11.5 - 15.5 %   Platelets 135 (L) 150 - 400 K/uL  nRBC 0.0 0.0 - 0.2 %   Neutrophils Relative % 72 %   Neutro Abs 5.7 1.7 - 7.7 K/uL   Lymphocytes Relative 14 %   Lymphs Abs 1.1 0.7 - 4.0 K/uL   Monocytes Relative 12 %   Monocytes Absolute 1.0 0.1 - 1.0 K/uL   Eosinophils Relative 2 %   Eosinophils Absolute 0.1 0.0 - 0.5 K/uL   Basophils Relative 0 %   Basophils Absolute 0.0 0.0 - 0.1 K/uL   Immature Granulocytes 0 %   Abs Immature Granulocytes 0.03 0.00 - 0.07 K/uL    Comment: Performed at [facility] [address], [place], [place] [address]  Ethanol     Status: None   Collection Time: 06/03/19  4:20 AM  Result Value Ref Range   Alcohol, Ethyl (B) <10 <10 mg/dL    Comment: (NOTE) Lowest detectable limit for serum alcohol is 10 mg/dL. For medical purposes only. Performed at [facility], [place] [address] [place], [place] [address]   Salicylate level     Status: None   Collection Time: 06/03/19  4:20 AM  Result Value Ref Range   Salicylate Lvl <9.5 2.8 - 30.0 mg/dL    Comment: Performed at [facility] [address], [place], [place] [address]  Acetaminophen level     Status: Abnormal   Collection Time: 06/03/19  4:20 AM  Result Value Ref Range   Acetaminophen (Tylenol), Serum <10 (L) 10 - 30 ug/mL    Comment: (NOTE) Therapeutic concentrations vary significantly. A range of 10-30 ug/mL  may be an effective concentration for many patients. However, some  are best treated at concentrations outside of this range. Acetaminophen concentrations >150 ug/mL at 4 hours after ingestion  and >50 ug/mL at 12 hours after ingestion are often associated with  toxic reactions. Performed at [facility], [place] [address], [place], [place] [address]   Ammonia     Status: Abnormal   Collection Time: 06/03/19  4:32 AM  Result Value Ref Range   Ammonia 36 (H) 9 - 35 umol/L    Comment: Performed at [facility], [place] [address], [place], [place] [address]  Lactic acid, plasma     Status: None   Collection Time: 06/03/19  4:32 AM  Result Value Ref Range   Lactic Acid, Venous 0.9 0.5 - 1.9 mmol/L    Comment: Performed at [facility] [address], [place], [place] [address]  CBG monitoring, ED     Status: Abnormal   Collection Time: 06/03/19  4:54 AM  Result Value Ref Range   Glucose-Capillary 102 (H) 70 - 99 mg/dL  Urinalysis, Complete w Microscopic     Status: Abnormal   Collection Time: 06/03/19  9:45 AM  Result Value Ref Range   Color, Urine  STRAW (A) YELLOW   APPearance CLEAR CLEAR   Specific Gravity, Urine 1.005 1.005 - 1.030   pH 5.0 5.0 - 8.0   Glucose, UA NEGATIVE NEGATIVE mg/dL   Hgb urine dipstick NEGATIVE NEGATIVE   Bilirubin Urine NEGATIVE NEGATIVE   Ketones, ur NEGATIVE NEGATIVE mg/dL   Protein, ur NEGATIVE NEGATIVE mg/dL   Nitrite NEGATIVE NEGATIVE   Leukocytes,Ua NEGATIVE NEGATIVE   RBC / HPF 0-5 0 - 5 RBC/hpf   WBC, UA 0-5 0 - 5 WBC/hpf   Bacteria, UA NONE SEEN NONE SEEN   Squamous Epithelial / LPF 0-5 0 - 5    Comment: Performed  at [facility], [place] [address], [place], [place] [address]  Urine rapid drug screen (hosp performed)     Status: Abnormal   Collection Time: 06/03/19  9:45 AM  Result Value Ref Range   Opiates NONE DETECTED NONE DETECTED   Cocaine NONE DETECTED NONE DETECTED   Benzodiazepines POSITIVE (A) NONE DETECTED   Amphetamines NONE DETECTED NONE DETECTED   Tetrahydrocannabinol NONE DETECTED NONE DETECTED   Barbiturates NONE DETECTED NONE DETECTED    Comment: (NOTE) DRUG SCREEN FOR MEDICAL PURPOSES ONLY.  IF CONFIRMATION IS NEEDED FOR ANY PURPOSE, NOTIFY LAB WITHIN 5 DAYS. LOWEST DETECTABLE LIMITS FOR URINE DRUG SCREEN Drug Class                     Cutoff (ng/mL) Amphetamine and metabolites    1000 Barbiturate and metabolites    200 Benzodiazepine                 923 Tricyclics and metabolites     300 Opiates and metabolites        300 Cocaine and metabolites        300 THC                            50 Performed at [facility], [place] [address], [place], [place] [address]   CBC     Status: Abnormal   Collection Time: 06/03/19 10:15 AM  Result Value Ref Range   WBC 8.1 4.0 - 10.5 K/uL   RBC 3.53 (L) 3.87 - 5.11 MIL/uL   Hemoglobin 10.5 (L) 12.0 - 15.0 g/dL   HCT 34.1 (L) 36.0 - 46.0 %   MCV 96.6 80.0 - 100.0 fL   MCH 29.7 26.0 - 34.0 pg   MCHC 30.8 30.0 - 36.0 g/dL   RDW 13.4 11.5 - 15.5 %   Platelets 106 (L) 150 - 400 K/uL    Comment: REPEATED TO  VERIFY PLATELET COUNT CONFIRMED BY SMEAR SPECIMEN CHECKED FOR CLOTS Immature Platelet Fraction may be clinically indicated, consider ordering this additional test [id number]    nRBC 0.5 (H) 0.0 - 0.2 %    Comment: Performed at [facility], [place] [address], [place], [place] [address]  Creatinine, serum     Status: Abnormal   Collection Time: 06/03/19 10:15 AM  Result Value Ref Range   Creatinine, Ser 1.10 (H) 0.44 - 1.00 mg/dL   GFR calc non Af Amer 47 (L) >60 mL/min   GFR calc Af Amer 55 (L) >60 mL/min    Comment: Performed at [facility] [address], [place], [place] [address]  Basic metabolic panel     Status: Abnormal   Collection Time: 06/04/19  2:37 AM  Result Value Ref Range   Sodium 142 135 - 145 mmol/L   Potassium 3.6 3.5 - 5.1 mmol/L   Chloride 109 98 - 111 mmol/L   CO2 24 22 - 32 mmol/L   Glucose, Bld 99 70 - 99 mg/dL   BUN 20 8 - 23 mg/dL   Creatinine, Ser 1.06 (H) 0.44 - 1.00 mg/dL   Calcium 8.7 (L) 8.9 - 10.3 mg/dL   GFR calc non Af Amer 50 (L) >60 mL/min   GFR calc Af Amer 57 (L) >60 mL/min   Anion gap 9 5 - 15    Comment: Performed at [facility] [address], [place],  [address]  CBC     Status: Abnormal   Collection Time: 06/04/19  2:37 AM  Result Value Ref Range   WBC 8.0 4.0 - 10.5 K/uL   RBC 3.51 (L) 3.87 - 5.11 MIL/uL   Hemoglobin 10.3 (L) 12.0 - 15.0 g/dL   HCT 32.7 (L) 36.0 - 46.0 %   MCV 93.2 80.0 - 100.0 fL   MCH 29.3 26.0 - 34.0 pg   MCHC 31.5 30.0 - 36.0 g/dL   RDW 13.3 11.5 - 15.5 %   Platelets 126 (L) 150 - 400 K/uL   nRBC 0.0 0.0 - 0.2 %    Comment: Performed at [facility], [place] [address], [place], [place] [address]    Medications:  Current Facility-Administered Medications  Medication Dose Route Frequency Provider Last Rate Last Dose  . 0.9 %  sodium chloride infusion   Intravenous Continuous Gu[place]ford Shi, MD 75 mL/hr at 06/04/19 0651    . acetaminophen (TYLENOL) tablet 650 mg  650 mg Oral  Q6H PRN Guilford Shi, MD   650 mg at 06/03/19 2124   Or  . acetaminophen (TYLENOL) suppository 650 mg  650 mg Rectal Q6H PRN Guilford Shi, MD      . ampicillin-sulbactam (UNASYN) 1.5 g in sodium chloride 0.9 % 100 mL IVPB  1.5 g Intravenous Q6H Kamineni, Neelima, MD 200 mL/hr at 06/04/19 0825 1.5 g at 06/04/19 0825  . aspirin EC tablet 81 mg  81 mg Oral Daily Guilford Shi, MD   81 mg at 06/04/19 0843  . enoxaparin (LOVENOX) injection 40 mg  40 mg Subcutaneous Q24H Guilford Shi, MD   40 mg at 06/04/19 0849  . FLUoxetine (PROZAC) capsule 20 mg  20 mg Oral Daily Guilford Shi, MD   20 mg at 06/04/19 0843  . fluticasone (FLONASE) 50 MCG/ACT nasal spray 1 spray  1 spray Each Nare Daily Guilford Shi, MD   1 spray at 06/04/19 0848  . folic acid (FOLVITE) tablet 1 mg  1 mg Oral Daily Guilford Shi, MD   1 mg at 06/04/19 0843  . haloperidol lactate (HALDOL) injection 2 mg  2 mg Intravenous Q6H PRN Guilford Shi, MD      . hydrALAZINE (APRESOLINE) tablet 75 mg  75 mg Oral Q8H Kamineni, Lamount Cranker, MD   75 mg at 06/04/19 0543  . ketotifen (ZADITOR) 0.025 % ophthalmic solution 1 drop  1 drop Both Eyes BID Guilford Shi, MD   1 drop at 06/04/19 0846  . levothyroxine (SYNTHROID, LEVOTHROID) injection 25 mcg  25 mcg Intravenous QAC breakfast Guilford Shi, MD   25 mcg at 06/04/19 0536  . metoprolol tartrate (LOPRESSOR) injection 2.5 mg  2.5 mg Intravenous Q8H Guilford Shi, MD   2.5 mg at 06/04/19 0543  . ondansetron (ZOFRAN) tablet 4 mg  4 mg Oral Q6H PRN Guilford Shi, MD       Or  . ondansetron (ZOFRAN) injection 4 mg  4 mg Intravenous Q6H PRN Guilford Shi, MD      . QUEtiapine (SEROQUEL) tablet 100 mg  100 mg Oral QHS Guilford Shi, MD   100 mg at 06/03/19 2250  . simvastatin (ZOCOR) tablet 10 mg  10 mg Oral q1800 Guilford Shi, MD   10 mg at 06/03/19 1746  . thiamine (VITAMIN B-1) tablet 100 mg  100 mg Oral Daily Guilford Shi, MD   100 mg  at 06/04/19 0842  . venlafaxine XR (EFFEXOR-XR) 24 hr capsule 37.5 mg  37.5 mg Oral Q breakfast Kamineni, Neelima,  MD   37.5 mg at 06/04/19 2334    Musculoskeletal: Strength & Muscle Tone: decreased Gait & Station: unsteady Patient leans: N/A  Psychiatric Specialty Exam: Physical Exam  Nursing note and vitals reviewed. Constitutional: She is oriented to person, place, and time. She appears well-developed and well-nourished.  Cardiovascular: Normal rate.  Respiratory: Effort normal.  Musculoskeletal: Normal range of motion.  Neurological: She is alert and oriented to person, place, and time.  Skin: Skin is warm.    Review of Systems  Constitutional: Negative.   HENT: Negative.   Eyes: Negative.   Respiratory: Negative.   Cardiovascular: Negative.   Gastrointestinal: Negative.   Genitourinary: Negative.   Musculoskeletal: Negative.   Skin: Negative.   Neurological: Negative.   Endo/Heme/Allergies: Negative.   Psychiatric/Behavioral: Positive for depression. Negative for hallucinations and suicidal ideas.    Blood pressure (!) 171/152, pulse 75, temperature 98.1 F (36.7 C), temperature source Oral, resp. rate 18, height 5' (1.524 m), weight 74 kg, SpO2 93 %.Body mass index is 31.86 kg/m.  General Appearance: Casual  Eye Contact:  Good  Speech:  Clear and Coherent and Normal Rate  Volume:  Decreased  Mood:  Euthymic  Affect:  Congruent  Thought Process:  Coherent and Descriptions of Associations: Intact  Orientation:  Full (Time, Place, and Person)  Thought Content:  WDL  Suicidal Thoughts:  No  Homicidal Thoughts:  No  Memory:  Immediate;   Good Recent;   Poor Remote;   Fair  Judgement:  Fair  Insight:  Fair  Psychomotor Activity:  Normal  Concentration:  Concentration: Fair and Attention Span: Fair  Recall:  Good  Fund of Knowledge:  Fair  Language:  Good  Akathisia:  No  Handed:  Right  AIMS (if indicated):     Assets:  Communication Skills Desire for  Improvement Financial Resources/Insurance Physical Health Social Support Transportation  ADL's:  Impaired  Cognition:  WNL  Sleep:        Treatment Plan Summary: Follow up with outpaient provider for psychiatric treatment  Continue psychiatric medications at current doses Please re-consult for any additional needs  Disposition: No evidence of imminent risk to self or others at present.   Patient does not meet criteria for psychiatric inpatient admission.  This service was provided via telemedicine using a 2-way, interactive audio and video technology.  Names of all persons participating in this telemedicine service and their role in this encounter. Name: Miguel Rota Role: Patient  Name: Marvia Pickles NP Role: Provider  Name:  Role:   Name:  Role:     Lewis Shock, FNP 06/04/2019 2:41 PM

## 2019-06-04 NOTE — Progress Notes (Signed)
Patient resistant with incontinence care. Patient sting staff was hurting her with every touch. Patient screaming very loudly and repeatedly stating "Please God" and "I just want to die". After incontinence care, patient closed eyes, no more agitation noted. Will continue to monitor.

## 2019-06-05 ENCOUNTER — Inpatient Hospital Stay (HOSPITAL_COMMUNITY): Payer: Medicare Other

## 2019-06-05 DIAGNOSIS — F411 Generalized anxiety disorder: Secondary | ICD-10-CM

## 2019-06-05 DIAGNOSIS — Z7189 Other specified counseling: Secondary | ICD-10-CM

## 2019-06-05 DIAGNOSIS — F329 Major depressive disorder, single episode, unspecified: Secondary | ICD-10-CM

## 2019-06-05 DIAGNOSIS — I10 Essential (primary) hypertension: Secondary | ICD-10-CM

## 2019-06-05 DIAGNOSIS — R531 Weakness: Secondary | ICD-10-CM

## 2019-06-05 DIAGNOSIS — F32A Depression, unspecified: Secondary | ICD-10-CM

## 2019-06-05 DIAGNOSIS — Z515 Encounter for palliative care: Secondary | ICD-10-CM

## 2019-06-05 LAB — CBC WITH DIFFERENTIAL/PLATELET
Abs Immature Granulocytes: 0.03 10*3/uL (ref 0.00–0.07)
Basophils Absolute: 0 10*3/uL (ref 0.0–0.1)
Basophils Relative: 0 %
Eosinophils Absolute: 0.1 10*3/uL (ref 0.0–0.5)
Eosinophils Relative: 1 %
HCT: 33.2 % — ABNORMAL LOW (ref 36.0–46.0)
Hemoglobin: 10.4 g/dL — ABNORMAL LOW (ref 12.0–15.0)
Immature Granulocytes: 0 %
Lymphocytes Relative: 17 %
Lymphs Abs: 1.3 10*3/uL (ref 0.7–4.0)
MCH: 29.6 pg (ref 26.0–34.0)
MCHC: 31.3 g/dL (ref 30.0–36.0)
MCV: 94.6 fL (ref 80.0–100.0)
Monocytes Absolute: 0.8 10*3/uL (ref 0.1–1.0)
Monocytes Relative: 11 %
Neutro Abs: 5.4 10*3/uL (ref 1.7–7.7)
Neutrophils Relative %: 71 %
Platelets: 145 10*3/uL — ABNORMAL LOW (ref 150–400)
RBC: 3.51 MIL/uL — ABNORMAL LOW (ref 3.87–5.11)
RDW: 13.6 % (ref 11.5–15.5)
WBC: 7.7 10*3/uL (ref 4.0–10.5)
nRBC: 0 % (ref 0.0–0.2)

## 2019-06-05 LAB — COMPREHENSIVE METABOLIC PANEL
ALT: 20 U/L (ref 0–44)
AST: 17 U/L (ref 15–41)
Albumin: 2.8 g/dL — ABNORMAL LOW (ref 3.5–5.0)
Alkaline Phosphatase: 86 U/L (ref 38–126)
Anion gap: 8 (ref 5–15)
BUN: 16 mg/dL (ref 8–23)
CO2: 24 mmol/L (ref 22–32)
Calcium: 8.7 mg/dL — ABNORMAL LOW (ref 8.9–10.3)
Chloride: 109 mmol/L (ref 98–111)
Creatinine, Ser: 1.33 mg/dL — ABNORMAL HIGH (ref 0.44–1.00)
GFR calc Af Amer: 44 mL/min — ABNORMAL LOW (ref >60)
GFR calc non Af Amer: 38 mL/min — ABNORMAL LOW (ref >60)
Glucose, Bld: 108 mg/dL — ABNORMAL HIGH (ref 70–99)
Potassium: 3.4 mmol/L — ABNORMAL LOW (ref 3.5–5.1)
Sodium: 141 mmol/L (ref 135–145)
Total Bilirubin: 0.6 mg/dL (ref 0.3–1.2)
Total Protein: 5.6 g/dL — ABNORMAL LOW (ref 6.5–8.1)

## 2019-06-05 LAB — TSH: TSH: 1.413 u[IU]/mL (ref 0.350–4.500)

## 2019-06-05 LAB — NOVEL CORONAVIRUS, NAA (HOSP ORDER, SEND-OUT TO REF LAB; TAT 18-24 HRS): SARS-CoV-2, NAA: NOT DETECTED

## 2019-06-05 LAB — FOLATE: Folate: 36 ng/mL (ref >5.9)

## 2019-06-05 LAB — MAGNESIUM: Magnesium: 1.8 mg/dL (ref 1.7–2.4)

## 2019-06-05 LAB — VITAMIN B12: Vitamin B-12: 552 pg/mL (ref 180–914)

## 2019-06-05 MED ORDER — POTASSIUM CHLORIDE CRYS ER 20 MEQ PO TBCR
40.0000 meq | EXTENDED_RELEASE_TABLET | Freq: Once | ORAL | Status: AC
  Administered 2019-06-05: 40 meq via ORAL
  Filled 2019-06-05: qty 2

## 2019-06-05 NOTE — Consult Note (Signed)
   [facility] CM Inpatient Consult   06/05/2019  MAXWELL MARTORANO Dec 17, 1935 [id number]    Patient screened for 39% extreme high risk score for unplanned readmission with 3 hospitalizations and 1 ED visit in the past 6 months under her Medicare/ Next[employer] plan and to check for potential needs [facility].  Review of patient's medical record and with MD's brief narrative note, reveal as:  Patient is an 83 year old female with history of CVA, CAD, paroxysmal A. fib not on anticoagulation secondary to falls, hypertension, chronic diastolic CHF, anxiety/depression on multiple psychiatric medications, hypothyroidism-   presented with altered mental status on 06/03/2019.  Patient is on multiple psychiatric medications.  In the ED, CT of the head was unremarkable.  (Acute metabolic encephalopathy versus toxic encephalopathy,Anxiety/ depression on multiple psychiatric medications, Right mastoid effusion)  Psychiatry consult done and has cleared patient for need of inpatient psychiatric hospitalization.   Review of palliative consult note states that daughter is agreeable to CIR if indicated, but does not want her mother to discharge to outpatient SNF, and patient may benefit from Outpatient Palliative referral and Outpatient psych follow-up.   Primary Care Provider is Dr. [name] with [facility] at [facility], listed as providing transition of care follow-up.  Plan:  Follow for progression, disposition and needs. Please place a [facility] Care Management referral as appropriate for follow-up post discharge.  Of note, [facility] Care Management services does not replace or interfere with any services that are arranged by transition of care case management or social work.    For questions and referral, please contact:  Ariyonna Twichell A. Dondi Burandt, BSN, RN-BC [facility] Liaison Cell: [phone]

## 2019-06-05 NOTE — Progress Notes (Signed)
Patient ID: [name] [name], female   DOB: 1936-03-17, 83 y.o.   MRN: [id number]  PROGRESS NOTE    [name] [name]  NOM:[id number] DOB: 1936/11/30 DOA: 06/03/2019 PCP: [name], MD   Brief Narrative:  83 year old female with history of CVA, CAD, paroxysmal A. fib not on anticoagulation secondary to falls, hypertension, chronic diastolic CHF, anxiety/depression on multiple psychiatric medications, hypothyroidism presented with altered mental status on 06/03/2019.  Patient is on multiple psychiatric medications.  In the ED, CT of the head was unremarkable.  Patient was combative and fighting staff in the ED.  Assessment & Plan:   Acute metabolic encephalopathy versus toxic encephalopathy Anxiety/depression on multiple psychiatric medications Right mastoid effusion -Patient presented with altered mental status.  Unclear source.  Could be secondary to polypharmacy as patient is on multiple psychiatric medications. -CT of the head was unremarkable except for near complete right mastoid effusion on presentation.  Patient was confused yesterday in the morning but as per the nursing staff, she was more awake later in the afternoon/evening.   MRI of the brain is still pending. -Empirically started on Unasyn for probable mastoiditis.  Will continue same. -Diet as per SLP recommendations. -Benzodiazepines including Librium held along with Ambien.  Seroquel dose has been decreased to 100 mg at night, apparently dose had been increased to 200 mg recently. -Psychiatry consultation appreciated: Psychiatry recommends follow-up with outpatient provider for psychiatric treatment and to continue current psychiatric medications at current doses.  No need for inpatient psychiatric hospitalization. -Fall precautions. -Neurochecks -vitamin B12, folate, TSH levels normal. -PT eval  Hypertension  -Blood pressure on the high side.  Monitor.  Continue hydralazine and metoprolol.    Chronic diastolic heart failure  -Compensated.  Echo in 09/18/2018 had shown EF of 60 to 65% -Lasix on hold for now.  Strict input output.  Daily weights.  Hypokalemia--replace.  Repeat a.m. labs  History of CVA -No signs of focal neurologic.  Continue aspirin and statin. -Follow MRI -PT/OT eval  Paroxysmal A. fib -Currently rate controlled.  Continue metoprolol.  Continue aspirin.  Not on anticoagulation as an outpatient because of history of falls  Hypothyroidism  -Continue Synthroid.  Generalized deconditioning -Overall prognosis is guarded to poor.  Patient is on multiple medications for her psychiatric condition.  Still full code.  Palliative care consult requested.  DVT prophylaxis: Lovenox Code Status: Full Family Communication: Spoke to Catherine/daughter on phone on 06/04/2019 Disposition Plan: Home in 1-2 days if clinically improves  Consultants: Psychiatry  Procedures: None  Antimicrobials: None   Subjective: Patient seen and examined at bedside.  She is sleepy, hardly wakes up on calling her name.  No overnight fever, vomiting or agitation reported by nursing staff.  Patient was apparently more awake during later half of the day on 06/04/2019 as per nursing staff.  Objective: Vitals:   06/04/19 1424 06/04/19 1545 06/04/19 2144 06/05/19 0535  BP: (!) 171/152 (!) 186/84 (!) 171/72 (!) 182/81  Pulse: 75  88 88  Resp: 18  16 16   Temp: 98.1 F (36.7 C)  97.9 F (36.6 C) 97.8 F (36.6 C)  TempSrc: Oral  Oral Oral  SpO2: 93%  95% 95%  Weight:      Height:        Intake/Output Summary (Last 24 hours) at 06/05/2019 0854 Last data filed at 06/05/2019 0700 Gross per 24 hour  Intake 1955.83 ml  Output 500 ml  Net 1455.83 ml   Filed Weights   06/03/19 0424  Weight:  74 kg    Examination:  General exam: Elderly female.  Lying in bed.  Sleepy, hardly wakes up on calling her name.  Looks chronically ill. Respiratory system: Bilateral decreased breath sounds at bases with scattered crackles,  mostly in the bases.  No wheezing Cardiovascular system: Rate controlled, S1-S2 heard Gastrointestinal system: Abdomen is nondistended, soft and nontender. Normal bowel sounds heard. Extremities: No cyanosis; trace edema Central nervous system: Sleepy, hardly wakes up on calling her name.  No focal neurological deficits. Moving extremities Skin: No rashes, lesions or ulcers Psychiatry: Could not be assessed because of mental status.    Data Reviewed: I have personally reviewed following labs and imaging studies  CBC: Recent Labs  Lab 06/03/19 0420 06/03/19 1015 06/04/19 0237 06/05/19 0311  WBC 8.0 8.1 8.0 7.7  NEUTROABS 5.7  --   --  5.4  HGB 11.0* 10.5* 10.3* 10.4*  HCT 34.3* 34.1* 32.7* 33.2*  MCV 94.0 96.6 93.2 94.6  PLT 135* 106* 126* 353*   Basic Metabolic Panel: Recent Labs  Lab 06/03/19 0420 06/03/19 1015 06/04/19 0237 06/05/19 0311  NA 139  --  142 141  K 3.9  --  3.6 3.4*  CL 104  --  109 109  CO2 24  --  24 24  GLUCOSE 111*  --  99 108*  BUN 30*  --  20 16  CREATININE 1.18* 1.10* 1.06* 1.33*  CALCIUM 8.7*  --  8.7* 8.7*  MG  --   --   --  1.8   GFR: Estimated Creatinine Clearance: 30.3 mL/min (A) (by C-G formula based on SCr of 1.33 mg/dL (H)). Liver Function Tests: Recent Labs  Lab 06/03/19 0420 06/05/19 0311  AST 24 17  ALT 25 20  ALKPHOS 87 86  BILITOT 0.4 0.6  PROT 6.0* 5.6*  ALBUMIN 3.1* 2.8*   No results for input(s): LIPASE, AMYLASE in the last 168 hours. Recent Labs  Lab 06/03/19 0432  AMMONIA 36*   Coagulation Profile: No results for input(s): INR, PROTIME in the last 168 hours. Cardiac Enzymes: No results for input(s): CKTOTAL, CKMB, CKMBINDEX, TROPONINI in the last 168 hours. BNP (last 3 results) No results for input(s): PROBNP in the last 8760 hours. HbA1C: No results for input(s): HGBA1C in the last 72 hours. CBG: Recent Labs  Lab 06/03/19 0454  GLUCAP 102*   Lipid Profile: No results for input(s): CHOL, HDL,  LDLCALC, TRIG, CHOLHDL, LDLDIRECT in the last 72 hours. Thyroid Function Tests: Recent Labs    06/05/19 0311  TSH 1.413   Anemia Panel: Recent Labs    06/05/19 0311  VITAMINB12 552  FOLATE 36.0   Sepsis Labs: Recent Labs  Lab 06/03/19 0432  LATICACIDVEN 0.9    Recent Results (from the past 240 hour(s))  Novel Coronavirus,NAA,(SEND-OUT TO REF LAB - TAT 24-48 hrs); Hosp Order     Status: None   Collection Time: 06/03/19  9:45 AM   Specimen: Respiratory  Result Value Ref Range Status   SARS-CoV-2, NAA NOT DETECTED NOT DETECTED Final    Comment: (NOTE) Testing was performed using the cobas(R) SARS-CoV-2 test. This test was developed and its performance characteristics determined by [employer]. This test has not been FDA cleared or approved. This test has been authorized by FDA under an Emergency Use Authorization (EUA). This test is only authorized for the duration of time the declaration that circumstances exist justifying the authorization of the emergency use of in vitro diagnostic tests for detection of SARS-CoV-2 virus  and/or diagnosis of COVID-19 infection under section 564(b)(1) of the Act, 21 U.S.C. 567OLI-1(C)(3), unless the authorization is terminated or revoked sooner. When diagnostic testing is negative, the possibility of a false negative result should be considered in the context of a patient's recent exposures and the presence of clinical signs and symptoms consistent with COVID-19. An individual without symptoms of COVID-19 and who is not shedding SARS-CoV-2 virus would expect to have  a negative (not detected) result in this assay. Performed At: [facility] [address] [place], [place] [id number] Rush Farmer MD LN:[id number]    [place]  Final    Comment: Performed at [facility], [place] [address], [place], [place] [address]         Radiology Studies: No results found.      Scheduled  Meds: . aspirin EC  81 mg Oral Daily  . enoxaparin (LOVENOX) injection  40 mg Subcutaneous Q24H  . FLUoxetine  20 mg Oral Daily  . fluticasone  1 spray Each Nare Daily  . folic acid  1 mg Oral Daily  . hydrALAZINE  75 mg Oral Q8H  . ketotifen  1 drop Both Eyes BID  . levothyroxine  100 mcg Oral Q0600  . metoprolol succinate  50 mg Oral Daily  . QUEtiapine  100 mg Oral QHS  . simvastatin  10 mg Oral q1800  . thiamine  100 mg Oral Daily  . venlafaxine XR  37.5 mg Oral Q breakfast   Continuous Infusions: . sodium chloride 50 mL/hr at 06/05/19 0700  . ampicillin-sulbactam (UNASYN) IV 1.5 g (06/05/19 0828)     LOS: 2 days        Aline August, MD [facility] 06/05/2019, 8:54 AM

## 2019-06-05 NOTE — Consult Note (Signed)
Consultation Note Date: 06/05/2019   Patient Name: [name] [name]  DOB: July 08, 1936  MRN: [id number]  Age / Sex: 83 y.o., female  PCP: [name], MD Referring Physician: Aline August, MD  Reason for Consultation: Establishing goals of care  HPI/Patient Profile: 83 y.o. female  with past medical history of CVA, paroxysmal afib not on anticoagulation, falls, hypertension, chronic diastolic CHF, hypothyroidism, anxiety/depression on multiple psychiatric medications admitted on 06/03/2019 with altered mental status. CT head unremarkable except near complete right mastoid effusion. IV Unasyn initiated. Psychiatry following with recommendation to continue current medication dosages and follow-up with outpatient psychiatry. PT/OT/SLP following. Palliative medicine consultation for goals of care. Recent hospitalization in March for aspiration/sepsis with discharge to rehab. Patient returned home April 21st.   Clinical Assessment and Goals of Care:  I have reviewed medical records, discussed with care team and met with patient at bedside to discuss diagnosis, [place], EOL wishes, disposition and options. Patient is awake, alert, oriented and able to participate in conversation.   I introduced Palliative Medicine as specialized medical care for people living with serious illness. It focuses on providing relief from the symptoms and stress of a serious illness. The goal is to improve quality of life for both the patient and the family.  Patient spends time discussing the recent loss of her husband of 42 years this past December. She shares that she is "broken-hearted" and has been grieving his loss. One daughter, [name] [name], who she lives with. Prior to admission, Ameliarose reports improvement in functional status until her ears got "stopped up." Good appetite and patient admits she has gained weight with frequent consumption of ice  cream since the loss of her husband.   Discussed events leading up to hospitalization and course of hospitalization including diagnoses, interventions, and plan of care.   I attempted to elicit values and goals of care important to the patient. Advanced directives, concepts specific to code status, artifical feeding and hydration, and rehospitalization were considered and discussed. Patient shares that she has a documented living will and has spoke to her daughter regarding wishes against heroic interventions. After further discussion, patient shares if she was critically ill, she would NOT want resuscitation or life support machine. She would not want feeding tube, understanding these heroic interventions would likely not improve quality of life. "I don't live a normal life now."  Patient shares her strong Darrick Meigs belief that "The Reita Cliche is in control." She is ready when the time comes but is worried about leaving her daughter.   At this point in discussion, patient and I called daughter [name] [name]) to inquire about completing a MOST form this hospitalization and documenting her mother's wishes. [name] [name] shares that she is "overwhelmed" but after further discussion with her mother, agrees and respects whatever decision her mother makes regarding heroic interventions. She shares she does not want her mother to "suffer" and acknowledges she has not been doing well since husband died. [name] [name] shares that they have discussed DNR in the past but not documented. [name] [name] agrees to  have her mother complete MOST for today.   Reviewed and completed MOST form with patient and daughter. Patient wishes including DNR/DNI, limited additional interventions including CPAP/BiPAP and rehospitalization if indicated, IVF for time trial, ABX if indicated, and NO feeding tube. Durable DNR completed. Copies made for chart and patient/daughter.   Patient is eager to work with physical therapy and hopes she can walk with  them tomorrow. She is hopeful to return home but willing to go back to rehab if necessary.  Updated daughter on diagnoses, interventions and plan of care. Daughter reports that patient was discharged to CIR last hospitalization. Daughter ok with inpatient rehab but does not want her mother in another facility, would be willing to bring her straight home following hospitalization if not eligible for CIR. Daughter lives with and available 24/7.   Questions and concerns were addressed.  Hard Choices booklet electronic copy sent to daughter. Therapeutic listening and emotional/spiritual support provided.     SUMMARY OF RECOMMENDATIONS    Initial GOC discussion with patient and daughter.   DNR/DNI. Otherwise, continue current plan of care and medical management.   MOST form completed. Patient's wishes include DNR/DNI, limited additional interventions including CPAP/BiPAP and rehospitalization if indicated, ABX if indicated, IVF for time trial, and NO feeding tube. Durable DNR completed. Copies of MOST form and Durable DNR placed in chart and left in patient belonging bag.  Patient motivated to work with therapy and hopeful to walk with therapists tomorrow. Daughter agreeable to CIR if indicated, but does not want her mother to discharge to outpatient SNF.   May benefit from outpatient palliative referral.   Outpatient psych follow-up. Patient reports establishment with outpatient provider for 30+ years.  Updated attending.  Code Status/Advance Care Planning:  DNR   Symptom Management:   Per attending  Palliative Prophylaxis:   Aspiration, Delirium Protocol, Oral Care and Turn Reposition  Psycho-social/Spiritual:   Desire for further Chaplaincy support:yes  Additional Recommendations: Caregiving  Support/Resources  Prognosis:   Unable to determine: guarded  Discharge Planning: To Be Determined      Primary Diagnoses: Present on Admission: . Metabolic encephalopathy   I  have reviewed the medical record, interviewed the patient and family, and examined the patient. The following aspects are pertinent.  Past Medical History:  Diagnosis Date  . Alopecia   . Anemia    hx of years ago   . Anxiety   . ARF (acute renal failure) ([place]) 08/23/2014  . Arthritis    psoriatic arthritis  . Back pain   . Blood transfusion    at age of 4  . Carotid stenosis 12/2016  . Chronic diastolic CHF (congestive heart failure) ([place])   . Depression   . Dysrhythmia 11/13/2016   PAFib for a short time- Echo done 11/14/16 PAF- to follow up with PCP- to see if she is a candiate for anticoags.  Not started at times time due to alcohol abuse.  [name] GERD (gastroesophageal reflux disease)   . H/O hiatal hernia   . History of acute cholangitis   . History of GI diverticular bleed   . Hyperlipidemia   . Hypertension   . Hypothyroidism   . IBS (irritable bowel syndrome)   . Pancreatitis   . Peripheral vascular disease ([place])   . PONV (postoperative nausea and vomiting)    with Breast reduction- only time  . Rosacea   . Seizures ([place]) 11/13/2016   Thopught to be from withdrawl Benzodiazepine  . Stroke [facility])  patient denies-  . Thyroid disease    Social History   Socioeconomic History  . Marital status: Married    Spouse name: Not on file  . Number of children: Not on file  . Years of education: Not on file  . Highest education level: Not on file  Occupational History  . Not on file  Social Needs  . Financial resource strain: Not on file  . Food insecurity    Worry: Not on file    Inability: Not on file  . Transportation needs    Medical: Not on file    Non-medical: Not on file  Tobacco Use  . Smoking status: Never Smoker  . Smokeless tobacco: Never Used  Substance and Sexual Activity  . Alcohol use: Yes    Alcohol/week: 7.0 standard drinks    Types: 7 Glasses of wine per week    Comment: rare  . Drug use: No    Types: Benzodiazepines  . Sexual activity: Not  Currently  Lifestyle  . Physical activity    Days per week: Not on file    Minutes per session: Not on file  . Stress: Not on file  Relationships  . Social Herbalist on phone: Not on file    Gets together: Not on file    Attends religious service: Not on file    Active member of club or organization: Not on file    Attends meetings of clubs or organizations: Not on file    Relationship status: Not on file  Other Topics Concern  . Not on file  Social History Narrative  . Not on file   Family History  Problem Relation Age of Onset  . Heart disease Mother   . Cancer Father        lung  . Bipolar disorder Brother    Scheduled Meds: . aspirin EC  81 mg Oral Daily  . enoxaparin (LOVENOX) injection  40 mg Subcutaneous Q24H  . FLUoxetine  20 mg Oral Daily  . fluticasone  1 spray Each Nare Daily  . folic acid  1 mg Oral Daily  . hydrALAZINE  75 mg Oral Q8H  . ketotifen  1 drop Both Eyes BID  . levothyroxine  100 mcg Oral Q0600  . metoprolol succinate  50 mg Oral Daily  . QUEtiapine  100 mg Oral QHS  . simvastatin  10 mg Oral q1800  . thiamine  100 mg Oral Daily  . venlafaxine XR  37.5 mg Oral Q breakfast   Continuous Infusions: . ampicillin-sulbactam (UNASYN) IV 1.5 g (06/05/19 1600)   PRN Meds:.acetaminophen **OR** acetaminophen, haloperidol lactate, metoprolol tartrate, ondansetron **OR** ondansetron (ZOFRAN) IV Medications Prior to Admission:  Prior to Admission medications   Medication Sig Start Date End Date Taking? Authorizing Provider  acetaminophen (TYLENOL) 325 MG tablet Take 1-2 tablets (325-650 mg total) by mouth every 4 (four) hours as needed for mild pain. 04/03/19  Yes Love, Ivan Anchors, PA-C  amoxicillin-clavulanate (AUGMENTIN) 875-125 MG tablet Take 1 tablet by mouth every 12 (twelve) hours. Patient taking differently: Take 1 tablet by mouth daily. Taking 1 tablet daily 05/29/19  Yes Kendrick, Caitlyn S, PA-C  aspirin EC 81 MG tablet Take 81 mg by mouth  daily.   Yes [provider]  chlordiazePOXIDE (LIBRIUM) 5 MG capsule Take 1 capsule (5 mg total) by mouth 2 (two) times daily as needed for anxiety. 04/03/19  Yes Love, Ivan Anchors, PA-C  FLUoxetine (PROZAC) 20 MG capsule Take  1 capsule (20 mg total) by mouth daily. 04/03/19  Yes Love, Ivan Anchors, PA-C  fluticasone (FLONASE) 50 MCG/ACT nasal spray Place 1 spray into both nostrils daily. Patient taking differently: Place 1 spray into both nostrils daily as needed for allergies.  04/04/19  Yes Love, Ivan Anchors, PA-C  folic acid (FOLVITE) 1 MG tablet Take 1 tablet (1 mg total) by mouth daily. 04/03/19  Yes Love, Ivan Anchors, PA-C  furosemide (LASIX) 20 MG tablet Take 1 tablet (20 mg total) by mouth daily. 04/03/19  Yes Love, Ivan Anchors, PA-C  hydrALAZINE (APRESOLINE) 25 MG tablet Take 3 tablets (75 mg total) by mouth every 8 (eight) hours. Patient taking differently: Take 75 mg by mouth 2 (two) times a day.  04/03/19  Yes Love, Ivan Anchors, PA-C  hyoscyamine (LEVBID) 0.375 MG 12 hr tablet Take 1 tablet (0.375 mg total) by mouth every 12 (twelve) hours as needed for cramping. 04/03/19  Yes Love, Ivan Anchors, PA-C  ketotifen (ZADITOR) 0.025 % ophthalmic solution Place 1 drop into both eyes 2 (two) times daily. Patient taking differently: Place 1 drop into both eyes 2 (two) times daily as needed (itchy eye relief).  04/03/19  Yes Love, Ivan Anchors, PA-C  levothyroxine (SYNTHROID) 100 MCG tablet Take 1 tablet (100 mcg total) by mouth daily at 6 (six) AM. 04/04/19  Yes Love, Ivan Anchors, PA-C  metoprolol succinate (TOPROL-XL) 25 MG 24 hr tablet Take 3 tablets (75 mg total) by mouth daily. 04/04/19  Yes Love, Ivan Anchors, PA-C  oxymetazoline (AFRIN) 0.05 % nasal spray Place 1 spray into both nostrils 2 (two) times daily as needed for congestion.   Yes [provider]  QUEtiapine (SEROQUEL) 25 MG tablet Take 1 tablet (25 mg total) by mouth at bedtime. Patient taking differently: Take 200 mg by mouth at bedtime.  04/03/19  Yes  Love, Ivan Anchors, PA-C  simvastatin (ZOCOR) 10 MG tablet Take 1 tablet (10 mg total) by mouth daily at 6 PM. 04/03/19  Yes Love, Ivan Anchors, PA-C  thiamine 100 MG tablet Take 1 tablet (100 mg total) by mouth daily. 03/14/19  Yes Thurnell Lose, MD  venlafaxine XR (EFFEXOR-XR) 37.5 MG 24 hr capsule Take 1 capsule (37.5 mg total) by mouth daily with breakfast. 04/04/19  Yes Love, Ivan Anchors, PA-C  zolpidem (AMBIEN) 5 MG tablet Take 1 tablet (5 mg total) by mouth at bedtime as needed for sleep. 04/03/19  Yes Love, Ivan Anchors, PA-C  cephALEXin (KEFLEX) 250 MG capsule Take 1 capsule (250 mg total) by mouth every 8 (eight) hours. Patient not taking: Reported on 06/03/2019 04/03/19   Love, Ivan Anchors, PA-C  pantoprazole (PROTONIX) 40 MG tablet Take 1 tablet (40 mg total) by mouth daily. Patient not taking: Reported on 04/24/2019 04/04/19   Love, Ivan Anchors, PA-C  saccharomyces boulardii (FLORASTOR) 250 MG capsule Take 1 capsule (250 mg total) by mouth 2 (two) times daily. Patient not taking: Reported on 06/03/2019 04/03/19   Love, Ivan Anchors, PA-C  senna (SENOKOT) 8.6 MG TABS tablet Take 2 tablets (17.2 mg total) by mouth at bedtime. Patient not taking: Reported on 06/03/2019 04/03/19   Bary Leriche, PA-C   Allergies  Allergen Reactions  . Tetanus Toxoids Swelling and Other (See Comments)    Arm (site) became swollen as a child  . Snake Antivenin [Antivenin Crotalidae Polyvalent] Other (See Comments)    Just can't take per daughter  . Yellow Dyes (Non-Tartrazine) Itching and Other (See Comments)    Makes  patient nervous    Review of Systems  Constitutional: Positive for activity change.  Neurological: Positive for weakness.   Physical Exam Vitals signs and nursing note reviewed.  Constitutional:      General: She is awake.  HENT:     Head: Normocephalic and atraumatic.  Pulmonary:     Effort: No tachypnea, accessory muscle usage or respiratory distress.  Abdominal:     Tenderness: There is no abdominal  tenderness.  Skin:    General: Skin is warm and dry.     Coloration: Skin is pale.  Neurological:     Mental Status: She is alert and oriented to person, place, and time.  Psychiatric:        Mood and Affect: Mood is anxious.        Speech: Speech normal.        Behavior: Behavior normal.        Cognition and Memory: Cognition normal.    Vital Signs: BP (!) 155/67 (BP Location: Right Arm)   Pulse 80   Temp 97.7 F (36.5 C)   Resp 17   Ht 5' (1.524 m)   Wt 74 kg   LMP  (Exact Date)   SpO2 97%   BMI 31.86 kg/m  Pain Scale: 0-10 POSS *See Group Information*: 1-Acceptable,Awake and alert Pain Score: 0-No pain   SpO2: SpO2: 97 % O2 Device:SpO2: 97 % O2 Flow Rate: .   IO: Intake/output summary:   Intake/Output Summary (Last 24 hours) at 06/05/2019 1641 Last data filed at 06/05/2019 0900 Gross per 24 hour  Intake 2075.83 ml  Output 500 ml  Net 1575.83 ml    LBM: Last BM Date: 06/04/19 Baseline Weight: Weight: 74 kg Most recent weight: Weight: 74 kg     Palliative Assessment/Data: PPS 50%   Flowsheet Rows     Most Recent Value  Intake Tab  Referral Department  Hospitalist  Unit at Time of Referral  Med/Surg Unit  Palliative Care Primary Diagnosis  Sepsis/Infectious Disease  Date Notified  06/04/19  Palliative Care Type  New Palliative care  Reason for referral  Clarify Goals of Care  Date first seen by Palliative Care  06/05/19  # of days Palliative referral response time  1 Day(s)  Clinical Assessment  Palliative Performance Scale Score  50%  Psychosocial & Spiritual Assessment  Palliative Care Outcomes  Patient/Family meeting held?  Yes  Who was at the meeting?  patient and daughter  Palliative Care Outcomes  Clarified goals of care, Provided end of life care assistance, Provided advance care planning, Provided psychosocial or spiritual support, Changed CPR status, Completed durable DNR, Linked to palliative care logitudinal support, ACP counseling  assistance      Time In: 1500 Time Out: 1630 Time Total: 49mn Greater than 50%  of this time was spent counseling and coordinating care related to the above assessment and plan.  Signed by:  MIhor Dow DNP, FNP-C Palliative Medicine Team  Phone: [phone]: [phone]  Please contact Palliative Medicine Team phone at [phone] questions and concerns.  For individual provider: See AShea Evans

## 2019-06-05 NOTE — Evaluation (Signed)
Physical Therapy Evaluation Patient Details Name: [name] [name] MRN: [id number] DOB: Apr 15, 1936 Today's Date: 06/05/2019   History of Present Illness  83 year old female with history of CVA, CAD, paroxysmal A. fib not on anticoagulation secondary to falls, hypertension, chronic diastolic CHF, anxiety/depression on multiple psychiatric medications, hypothyroidism presented with altered mental status on 06/03/2019.  Patient is on multiple psychiatric medications.  In the ED, CT of the head was unremarkable.  Patient was combative and fighting staff in the ED    Clinical Impression  Pt admitted with above diagnosis. Pt currently with functional limitations due to the deficits listed below (see PT Problem List). PTA, pt living with daughter, reports mod I with ambulation utilizing RW. Today, AOx4, following cues and pleasant with therapy. Weaker than baseline, min A for mobility at this time. With progress, HHPT with 24/7 support would be appropriate.  Pt will benefit from skilled PT to increase their independence and safety with mobility to allow discharge to the venue listed below.       Follow Up Recommendations Home health PT;Supervision/Assistance - 24 hour    Equipment Recommendations  (TBD)    Recommendations for Other Services OT consult     Precautions / Restrictions Precautions Precautions: Fall Restrictions Weight Bearing Restrictions: No      Mobility  Bed Mobility Overal bed mobility: Needs Assistance Bed Mobility: Supine to Sit;Sit to Supine   Sidelying to sit: Min assist       General bed mobility comments: min A to assist to sitting EOB  Transfers Overall transfer level: Needs assistance Equipment used: Rolling walker (2 wheeled) Transfers: Sit to/from Stand Sit to Stand: Min guard            Ambulation/Gait                Stairs            Wheelchair Mobility    Modified Rankin (Stroke Patients Only)       Balance Overall balance  assessment: Needs assistance   Sitting balance-Leahy Scale: Fair       Standing balance-Leahy Scale: Poor                               Pertinent Vitals/Pain Pain Assessment: No/denies pain    Home Living Family/patient expects to be discharged to:: Private residence Living Arrangements: Children Available Help at Discharge: Family;Available PRN/intermittently Type of Home: House Home Access: Stairs to enter Entrance Stairs-Rails: None Entrance Stairs-Number of Steps: 1 Home Layout: Other (Comment);Two level;1/2 bath on main level;Bed/bath upstairs Home Equipment: Walker - 2 wheels;Cane - single point Additional Comments: patient sleeps on couch on main level; was doing this over past few months    Prior Function Level of Independence: Needs assistance   Gait / Transfers Assistance Needed: ambulates with RW  ADL's / Homemaking Assistance Needed: reports indpendence with ADLs        Hand Dominance   Dominant Hand: Right    Extremity/Trunk Assessment   Upper Extremity Assessment Upper Extremity Assessment: Generalized weakness    Lower Extremity Assessment Lower Extremity Assessment: Generalized weakness       Communication   Communication: HOH  Cognition Arousal/Alertness: Awake/alert                                     General Comments: AOx4, following cues  General Comments      Exercises     Assessment/Plan    PT Assessment Patient needs continued PT services  PT Problem List Decreased strength       PT Treatment Interventions DME instruction;Gait training;Stair training;Functional mobility training;Therapeutic activities;Therapeutic exercise    PT Goals (Current goals can be found in the Care Plan section)  Acute Rehab PT Goals Patient Stated Goal: go home PT Goal Formulation: With patient Time For Goal Achievement: 06/19/19 Potential to Achieve Goals: Good    Frequency Min 3X/week   Barriers to  discharge        Co-evaluation               AM-PAC PT "6 Clicks" Mobility  Outcome Measure Help needed turning from your back to your side while in a flat bed without using bedrails?: A Little Help needed moving from lying on your back to sitting on the side of a flat bed without using bedrails?: A Little Help needed moving to and from a bed to a chair (including a wheelchair)?: A Little Help needed standing up from a chair using your arms (e.g., wheelchair or bedside chair)?: A Little Help needed to walk in hospital room?: A Lot Help needed climbing 3-5 steps with a railing? : A Lot 6 Click Score: 16    End of Session Equipment Utilized During Treatment: Gait belt Activity Tolerance: Patient tolerated treatment well Patient left: in bed Nurse Communication: Mobility status PT Visit Diagnosis: Unsteadiness on feet (R26.81)    Time: 1025-1050 PT Time Calculation (min) (ACUTE ONLY): 25 min   Charges:   PT Evaluation $PT Eval Moderate Complexity: 1 Mod PT Treatments $Therapeutic Activity: 8-22 mins       Reinaldo Berber, PT, DPT Acute Rehabilitation Services Pager: [phone] Office: [phone]    Reinaldo Berber 06/05/2019, 11:02 AM

## 2019-06-06 LAB — BASIC METABOLIC PANEL
Anion gap: 9 (ref 5–15)
BUN: 15 mg/dL (ref 8–23)
CO2: 22 mmol/L (ref 22–32)
Calcium: 8.6 mg/dL — ABNORMAL LOW (ref 8.9–10.3)
Chloride: 111 mmol/L (ref 98–111)
Creatinine, Ser: 1.33 mg/dL — ABNORMAL HIGH (ref 0.44–1.00)
GFR calc Af Amer: 44 mL/min — ABNORMAL LOW (ref >60)
GFR calc non Af Amer: 38 mL/min — ABNORMAL LOW (ref >60)
Glucose, Bld: 110 mg/dL — ABNORMAL HIGH (ref 70–99)
Potassium: 3.9 mmol/L (ref 3.5–5.1)
Sodium: 142 mmol/L (ref 135–145)

## 2019-06-06 LAB — CBC WITH DIFFERENTIAL/PLATELET
Abs Immature Granulocytes: 0.04 10*3/uL (ref 0.00–0.07)
Basophils Absolute: 0 10*3/uL (ref 0.0–0.1)
Basophils Relative: 0 %
Eosinophils Absolute: 0.1 10*3/uL (ref 0.0–0.5)
Eosinophils Relative: 1 %
HCT: 31 % — ABNORMAL LOW (ref 36.0–46.0)
Hemoglobin: 9.6 g/dL — ABNORMAL LOW (ref 12.0–15.0)
Immature Granulocytes: 1 %
Lymphocytes Relative: 16 %
Lymphs Abs: 1.4 10*3/uL (ref 0.7–4.0)
MCH: 29.8 pg (ref 26.0–34.0)
MCHC: 31 g/dL (ref 30.0–36.0)
MCV: 96.3 fL (ref 80.0–100.0)
Monocytes Absolute: 1 10*3/uL (ref 0.1–1.0)
Monocytes Relative: 12 %
Neutro Abs: 5.9 10*3/uL (ref 1.7–7.7)
Neutrophils Relative %: 70 %
Platelets: 145 10*3/uL — ABNORMAL LOW (ref 150–400)
RBC: 3.22 MIL/uL — ABNORMAL LOW (ref 3.87–5.11)
RDW: 13.9 % (ref 11.5–15.5)
WBC: 8.4 10*3/uL (ref 4.0–10.5)
nRBC: 0 % (ref 0.0–0.2)

## 2019-06-06 LAB — MAGNESIUM: Magnesium: 1.7 mg/dL (ref 1.7–2.4)

## 2019-06-06 MED ORDER — AMOXICILLIN-POT CLAVULANATE 875-125 MG PO TABS: 1.0000 | ORAL_TABLET | Freq: Two times a day (BID) | ORAL | 0 refills | Status: DC

## 2019-06-06 NOTE — Care Management Important Message (Signed)
Important Message  Patient Details  Name: [name] [name] MRN: [id number] Date of Birth: 1936/04/08   Medicare Important Message Given:  Yes     Carlis Burnsworth 06/06/2019, 1:20 PM

## 2019-06-06 NOTE — Progress Notes (Signed)
Daily Progress Note   Patient Name: [name] [name]       Date: 06/06/2019 DOB: January 30, 1936  Age: 83 y.o. MRN#: [id number] Attending Physician: [name] Ford, DO Primary Care Physician: [name] Carol, MD Admit Date: 06/03/2019  Reason for Consultation/Follow-up: Establishing goals of care  Subjective: Patient awake, alert, oriented. Comfortable sitting up in the chair. Worked with PT and SLP today. She was able to ambulate with walker. Denies pain or discomfort.   Patient tells me she may be discharged today. Explained that she does not need inpatient rehab and will be able to return home with her daughter. Discussed plan of care and outpatient PT. Recommended outpatient palliative referral for ongoing supportive care and focus on quality of life. Patient agreeable.   Discussed plan of care with daughter, [name] [name] via telephone. She is agreeable with outpatient palliative referral also. Hard Choices booklet left in patient belonging bag.   Length of Stay: 3  Current Medications: Scheduled Meds:  . aspirin EC  81 mg Oral Daily  . enoxaparin (LOVENOX) injection  40 mg Subcutaneous Q24H  . FLUoxetine  20 mg Oral Daily  . fluticasone  1 spray Each Nare Daily  . folic acid  1 mg Oral Daily  . hydrALAZINE  75 mg Oral Q8H  . ketotifen  1 drop Both Eyes BID  . levothyroxine  100 mcg Oral Q0600  . metoprolol succinate  50 mg Oral Daily  . QUEtiapine  100 mg Oral QHS  . simvastatin  10 mg Oral q1800  . thiamine  100 mg Oral Daily  . venlafaxine XR  37.5 mg Oral Q breakfast    Continuous Infusions: . ampicillin-sulbactam (UNASYN) IV 1.5 g (06/06/19 0808)    PRN Meds: acetaminophen **OR** acetaminophen, haloperidol lactate, metoprolol tartrate, ondansetron **OR** ondansetron (ZOFRAN) IV   Physical Exam Vitals signs and nursing note reviewed.  Constitutional:      General: She is awake.  HENT:     Head: Normocephalic and atraumatic.  Pulmonary:     Effort: No tachypnea, accessory muscle usage or respiratory distress.  Skin:    General: Skin is warm and dry.     Coloration: Skin is pale.  Neurological:     Mental Status: She is alert and oriented to person, place, and time.  Psychiatric:  Mood and Affect: Mood normal.        Speech: Speech normal.        Behavior: Behavior normal.        Cognition and Memory: Cognition normal.            Vital Signs: BP (!) 182/70 (BP Location: Right Arm)   Pulse 78   Temp 98.1 F (36.7 C) (Oral)   Resp 16   Ht 5' (1.524 m)   Wt 74 kg   LMP  (Exact Date)   SpO2 99%   BMI 31.86 kg/m  SpO2: SpO2: 99 % O2 Device: O2 Device: Room Air O2 Flow Rate:    Intake/output summary:   Intake/Output Summary (Last 24 hours) at 06/06/2019 1214 Last data filed at 06/06/2019 1000 Gross per 24 hour  Intake 120 ml  Output 400 ml  Net -280 ml   LBM: Last BM Date: 06/05/19 Baseline Weight: Weight: 74 kg Most recent weight: Weight: 74 kg       Palliative Assessment/Data: PPS 50%    Flowsheet Rows     Most Recent Value  Intake Tab  Referral Department  Hospitalist  Unit at Time of Referral  Med/Surg Unit  Palliative Care Primary Diagnosis  Sepsis/Infectious Disease  Date Notified  06/04/19  Palliative Care Type  New Palliative care  Reason for referral  Clarify Goals of Care  Date first seen by Palliative Care  06/05/19  # of days Palliative referral response time  1 Day(s)  Clinical Assessment  Palliative Performance Scale Score  50%  Psychosocial & Spiritual Assessment  Palliative Care Outcomes  Patient/Family meeting held?  Yes  Who was at the meeting?  patient and daughter  Palliative Care Outcomes  Clarified goals of care, Provided end of life care assistance, Provided advance care planning, Provided psychosocial  or spiritual support, Changed CPR status, Completed durable DNR, Linked to palliative care logitudinal support, ACP counseling assistance      Patient Active Problem List   Diagnosis Date Noted  . Palliative care by specialist   . Goals of care, counseling/discussion   . Depression   . Metabolic encephalopathy 09/38/1829  . Sepsis ([place]) 03/29/2019  . Bradycardia 03/29/2019  . Somnolence 03/29/2019  . Acute blood loss anemia   . Chronic diastolic congestive heart failure ([place])   . Weakness 03/13/2019  . Hypothermia 03/09/2019  . Elevated liver enzymes 03/09/2019  . Thrombocytopenia ([place]) 03/09/2019  . Hyperkalemia 07/23/2017  . AKI (acute kidney injury) ([place]) 07/23/2017  . 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 Fall 06/26/2017  . Intractable nausea and vomiting 03/11/2017  . Acute lower UTI 03/11/2017  . Carotid stenosis 12/28/2016  . Sedative, hypnotic, or anxiolytic withdrawal delirium 11/15/2016  . Paroxysmal atrial fibrillation ([facility])   . Cerebrovascular disease   . Fever   . Acute encephalopathy 11/10/2016  . Hypothyroidism 11/10/2016  . Hypertensive urgency 11/10/2016  . Altered mental state 11/10/2016  . Seizure ([place]) 11/10/2016  . Chronic diastolic CHF (congestive heart failure) ([place])   . Left carotid stenosis 10/29/2016  . S/P total knee replacement 03/10/2016  . 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 Ileus ([place]) 09/05/2014  . Leukocytosis 09/05/2014  . Vomiting 09/04/2014  . Pulmonary edema 09/04/2014  . Hypokalemia 08/28/2014  . Enteritis due to Clostridium difficile 08/27/2014  . 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 Protein calorie malnutrition ([place]) 08/24/2014  . Obstructive jaundice 08/23/2014  . Acute cholangitis 08/23/2014  .  ARF (acute renal failure) ([place]) 08/23/2014  . Coagulopathy ([place]) 08/23/2014  . Back pain   . Arthritis   . Hypertension   . GERD (gastroesophageal reflux disease)   . Anxiety  state   . IBS (irritable bowel syndrome)   . Hernia of Right posterior flank 04/18/2012    Palliative Care Assessment & Plan   Patient Profile: 83 y.o. female  with past medical history of CVA, paroxysmal afib not on anticoagulation, falls, hypertension, chronic diastolic CHF, hypothyroidism, anxiety/depression on multiple psychiatric medications admitted on 06/03/2019 with altered mental status. CT head unremarkable except near complete right mastoid effusion. IV Unasyn initiated. Psychiatry following with recommendation to continue current medication dosages and follow-up with outpatient psychiatry. PT/OT/SLP following. Palliative medicine consultation for goals of care. Recent hospitalization in March for aspiration/sepsis with discharge to rehab. Patient returned home April 21st.   Assessment: Acute metabolic encephalopathy resolved Anxiety Depression Chronic diastolic heart failure Paroxysmal A. Fib Right mastoid effusion Deconditioning/weakness Hx of CVA  Recommendations/Plan:  Initial GOC discussion with patient and daughter on 06/05/19.  DNR/DNI. Otherwise, continue current plan of care and medical management.   MOST form completed. Patient's wishes include DNR/DNI, limited additional interventions including CPAP/BiPAP and rehospitalization if indicated, ABX if indicated, IVF for time trial, and NO feeding tube. Durable DNR completed. Copies of MOST form and Durable DNR placed in chart and left in patient belonging bag. Hard Choices copy left with patient.   Likely discharge home today with home health. Patient/daughter agreeable with outpatient palliative referral.   Outpatient psych follow-up. Patient reports establishment with outpatient provider for 30+ years.    Code Status: DNR   Code Status Orders  (From admission, onward)         Start     Ordered   06/05/19 1625  Do not attempt resuscitation (DNR)  Continuous    Question Answer Comment  In the event of  cardiac or respiratory ARREST Do not call a "code blue"   In the event of cardiac or respiratory ARREST Do not perform Intubation, CPR, defibrillation or ACLS   In the event of cardiac or respiratory ARREST Use medication by any route, position, wound care, and other measures to relive pain and suffering. May use oxygen, suction and manual treatment of airway obstruction as needed for comfort.      06/05/19 1624        Code Status History    Date Active Date Inactive Code Status Order ID Comments User Context   06/03/2019 1003 06/05/2019 1624 Full Code [id number]  Guilford Shi, MD ED   03/13/2019 1734 04/03/2019 1555 Full Code [id number]  Flora Lipps Inpatient   03/09/2019 1502 03/13/2019 1726 Full Code [id number]  Emeterio Reeve, DO ED   07/23/2017 0142 07/23/2017 1718 Full Code [id number]  [name], DO ED   06/26/2017 1400 07/02/2017 1551 Full Code [id number]  Rondel Jumbo, PA-C ED   03/11/2017 0023 03/15/2017 1815 Full Code [id number]  Norval Morton, MD ED   12/28/2016 1248 12/29/2016 1538 Full Code [id number]  Ulyses Amor, PA-C Inpatient   11/10/2016 2350 11/18/2016 1933 Full Code [id number]  Ivor Costa, MD ED   09/04/2014 1707 09/08/2014 1731 Full Code [id number]  Domenic Polite, MD ED   08/23/2014 1931 08/28/2014 1728 Full Code [id number]  Jonetta Osgood, MD Inpatient   07/18/2014 1500 07/18/2014 1952 Full Code [id number]  Marda Stalker, PA-C Inpatient   Advance Care Planning Activity    Advance Directive Documentation  Most Recent Value  Type of Advance Directive  Healthcare Power of Attorney, Living will  Pre-existing out of facility DNR order (yellow form or pink MOST form)  -  "MOST" Form in Place?  -       Prognosis:   Unable to determine  Discharge Planning:  Home with [place] was discussed with patient, daughter  Thank you for allowing the Palliative Medicine Team to assist in the care of this patient.   Time In: 1100 Time Out: 1120  Total Time 20 Prolonged Time Billed no      Greater than 50%  of this time was spent counseling and coordinating care related to the above assessment and plan.  Ihor Dow, FNP-C Palliative Medicine Team  Phone: [phone] Fax: [phone]  Please contact Palliative Medicine Team phone at [phone] for questions and concerns.

## 2019-06-06 NOTE — Discharge Instructions (Signed)
Confusion °Confusion is the inability to think with the usual speed or clarity. People who are confused often describe their thinking as cloudy or unclear. Confusion can also include feeling disoriented. This means you are unaware of where you are or who you are. You may also not know the date or time. When confused, you may have difficulty remembering, paying attention, or making decisions. Some people also act aggressively when they are confused. °In some cases, confusion may come on quickly. In other cases, it may develop slowly over time. How quickly confusion comes on depends on the cause. °Confusion may be caused by: °· Head injury (concussion). °· Seizures. °· Stroke. °· Fever. °· Brain tumor. °· Decrease in brain function due to a vascular or neurologic condition (dementia). °· Emotions, like rage or terror. °· Inability to know what is real and what is not (hallucinations). °· Infections, such as a urinary tract infection (UTI). °· Using too much alcohol, drugs, or medicines. °· Loss of fluid (dehydration) or an imbalance of salts in the body (electrolytes). °· Lack of sleep. °· Low blood sugar (diabetes). °· Low levels of oxygen. This comes from conditions such as chronic lung disorders. °· Side effects of medicines, or taking medicines that affect other medicines (drug interactions). °· Lack of certain nutrients, especially niacin, thiamine, vitamin C, or vitamin B. °· Sudden drop in body temperature (hypothermia). °· Change in routine, such as traveling or being hospitalized. °Follow these instructions at home: °Pay attention to your symptoms. Tell your health care provider about any changes or if you develop new symptoms. Follow these instructions to control or treat symptoms. Ask a family member or friend for help if needed. °Medicines °· Take over-the-counter and prescription medicines only as told by your health care provider. °· Ask your health care provider about changing or stopping any medicines  that may be causing your confusion. °· Avoid pain medicines or sleep medicines until you have fully recovered. °· Use a pillbox or an alarm to help you take the right medicines at the right time. °Lifestyle ° °· Eat a balanced diet that includes fruits and vegetables. °· Get enough sleep. For most adults, this is 7-9 hours each night. °· Do not drink alcohol. °· Do not become isolated. Spend time with other people and make plans for your days. °· Do not drive until your health care provider says that it is safe to do so. °· Do not use any products that contain nicotine or tobacco, such as cigarettes and e-cigarettes. If you need help quitting, ask your health care provider. °· Stop other activities that may increase your chances of getting hurt. These may include some work duties, sports activities, swimming, or bike riding. Ask your health care provider what activities are safe for you. °What caregivers can do °· Find out if the person is confused. Ask the person to state his or her name, age, and the date. If the person is unsure or answers incorrectly, he or she may be confused. °· Always introduce yourself, no matter how well the person knows you. °· Remind the person of his or her location. Do this often. °· Place a calendar and clock near the person who is confused. °· Talk about current events and plans for the day. °· Keep the environment calm, quiet, and peaceful. °· Help the person do the things that he or she is unable to do. These include: °? Taking medicines. °? Keeping follow-up visits with his or her health care   provider. °? Helping with household duties, including meal preparation. °? Running errands. °· Get help if you need it. There are several support groups for caregivers. °· If the person you are helping needs more support, consider day care, extended care programs, or a skilled nursing facility. The person's health care provider may be able to help evaluate these options. °General  instructions °· Monitor yourself for any conditions you may have. These may include: °? Checking your blood glucose levels, if you have diabetes. °? Watching your weight, if you are overweight. °? Monitoring your blood pressure, if you have hypertension. °? Monitoring your body temperature, if you have a fever. °· Keep all follow-up visits as told by your health care provider. This is important. °Contact a health care provider if: °· Your symptoms get worse. °Get help right away if you: °· Feel that you are not able to care for yourself. °· Develop severe headaches, repeated vomiting, seizures, blackouts, or slurred speech. °· Have increasing confusion, weakness, numbness, restlessness, or personality changes. °· Develop a loss of balance, have marked dizziness, feel uncoordinated, or fall. °· Develop severe anxiety, or you have delusions or hallucinations. °These symptoms may represent a serious problem that is an emergency. Do not wait to see if the symptoms will go away. Get medical help right away. Call your local emergency services (911 in the [place] Do not drive yourself to the hospital. °Summary °· Confusion is the inability to think with the usual speed or clarity. People who are confused often describe their thinking as cloudy or unclear. °· Confusion can also include having difficulty remembering, paying attention, or making decisions. °· Confusion may come on quickly or develop slowly over time, depending on the cause. There are many different causes of confusion. °· Ask for help from family members or friends if you are unable to take care of yourself. °This information is not intended to replace advice given to you by your health care provider. Make sure you discuss any questions you have with your health care provider. °Document Released: 01/06/2005 Document Revised: 12/01/2017 Document Reviewed: 12/01/2017 °Elsevier Interactive Patient Education © 2019 [employer] ° °

## 2019-06-06 NOTE — Progress Notes (Signed)
[name] [name] to be D/C'd home per MD order. Discussed with the patient and all questions fully answered. Called daughter, [name] [name], and discussed discharge information. VVS, Skin clean, dry and intact without evidence of skin break down, no evidence of skin tears noted. Bruising noted to right flank. Patient states she bruises very easily. IV catheter discontinued intact. Site without signs and symptoms of complications. Dressing and pressure applied.  An After Visit Summary was printed and given to the patient.  Patient escorted via [place], and D/C home via private auto.  [name] [name]  06/06/2019 4:25 PM

## 2019-06-06 NOTE — Progress Notes (Signed)
  Speech Language Pathology Treatment: Dysphagia  Patient Details Name: [name] [name] MRN: [id number] DOB: December 24, 1935 Today's Date: 06/06/2019 Time: 0786-7544 SLP Time Calculation (min) (ACUTE ONLY): 21 min  Assessment / Plan / Recommendation Clinical Impression  Pt participated in skilled treatment with intake of regular/soft solids/thin liquid consistency PO intake without overt s/s of aspiration noted and min verbal cues only required initially from SLP; pt with efficient, timely swallow and adequate mastication of solids; pt requesting "regular" foods without difficulty noted during PO trial of regular this date.  Recommend progressing diet to regular/thin liquids with ST continuing to follow briefly (x1) for tolerance.  HPI HPI: Patient is an 83 y.o. female with PMH: CVA, CAD, paroxysmal afib, HTN, diastolic CHF, anxiety, depression on multiple psychiatric medications, hypothyroidism, who was brought to the hospital for AMS and unwittnessed fall. CT head did not reveal any acute intracranial abnormalities and CXR did not reveal any consolidation, pleural effusion or pneumothorax.      SLP Plan  Continue with current plan of care       Recommendations  Diet recommendations: Regular;Thin liquid(heart healthy) Liquids provided via: Cup;Straw Medication Administration: Whole meds with puree Supervision: Patient able to self feed;Intermittent supervision to cue for compensatory strategies Compensations: Minimize environmental distractions;Slow rate;Small sips/bites                Oral Care Recommendations: Oral care BID Follow up Recommendations: None SLP Visit Diagnosis: Dysphagia, unspecified (R13.10) Plan: Continue with current plan of care                       Elvina Sidle, M.S., [place] 06/06/2019, 11:05 AM

## 2019-06-06 NOTE — Discharge Summary (Addendum)
Physician Discharge Summary  [name] [name] MGQ:[id number] DOB: 1936-01-02 DOA: 06/03/2019  PCP: [name], MD  Admit date: 06/03/2019 Discharge date: 06/06/2019  Time spent: 45 minutes  Recommendations for Outpatient Follow-up:  Patient will be discharged to home with home health/ Home first program.  Patient will need to follow up with primary care provider within one week of discharge.  Patient should continue medications as prescribed.  Patient should follow a heart healthy diet.   Discharge Diagnoses:  Acute metabolic encephalopathy versus toxic encephalopathy Anxiety/depression on multiple psychiatric medications Right mastoid effusion Hypertension  Chronic diastolic heart failure Hypokalemia History of CVA Paroxysmal A. fib Hypothyroidism  Chronic kidney disease, stage III Generalized deconditioning Goals of care  Discharge Condition: Stable  Diet recommendation: heart healthy  Filed Weights   06/03/19 0424  Weight: 74 kg    History of present illness:  On 06/03/2019 by Dr. Guilford Shi [name] [name] is a 83 y.o. female with history h/o CVA, CAD, paroxysmal atrial fibrillation not on anticoagulation secondary to falls, hypertension, diastolic CHF, anxiety/depression on multiple psychiatric medications, hypothyroidism who was brought to the hospital for altered mental status.  According to the daughter patient appeared to be at her baseline yesterday morning when daughter informed her that she was going to walk the dogs and be back soon.  Upon daughter's return, patient was found on the floor in the sun room.  It is not clear if she hit her head.  According to the daughter patient appeared somewhat confused during the day but was able to be reoriented.  Apparently patient and daughter watch TV all day.  On 1 or 2 occasions patient appeared to be exhibiting bizarre behavior like  picking on things in the air as if trying to open a packet of chips while watching TV.   Later in the night, patient's condition worsened when she appeared to be talking to herself and hallucinating.  Daughter brought her into the hospital for further evaluation.  Of note, patient had a very similar presentation when she was admitted to this [facility] in March after a fall/being found on the floor where she had been laying for a while and EMS found her to be hypothermic/confused.  At that time patient was alcohol dependent and was also grieving her husband's death in 2023-12-11.  She was treated for aspiration/sepsis syndrome with antibiotics although there was no clear source of infection and also for alcohol withdrawal with Librium taper.  Hypothermia and mental status were believed secondary to polypharmacy/alcohol use. She was subsequently discharged to rehab where she stayed until April 21.  In the rehab she was apparently treated for UTI and given antibiotic course to complete at the time of discharge.  According to the daughter, few days after discharge patient developed dysuria for which she sought medical attention through PCP and was given 2 courses of antibiotic for resistant UTI.  She was beginning to do well earlier this month and was progressing with physical therapy, walking with a walker and was mentally clear earlier this month.  Daughter apparently has been staying with patient since her husband passed away and assisting with her medications.  According to the daughter patient has been having problems at night with sleep and anxiety.  Her Seroquel dose was increased from 100 mg to 200 mg nightly on June 3 by her psychiatrist.  She also takes Prozac Effexor and Ambien at baseline.  Per daughter, patient has been on Librium 10 mg twice daily since  discharge from rehab center.  Daughter apparently was down with a sinus infection recently after which patient started experiencing ear fullness/discomfort and was referred to ENT as conservative management with Flonase and  antihistamines did not work.  Daughter could not get ENT appointment until next week and patient presented to our ED on June 19 with complaints of earache.  She was prescribed Augmentin which she has been taking until yesterday.  In the ED today, patient is afebrile.  Vital signs are stable.  Labs within normal limits without any leukocytosis or AKI. CT head in the ED unremarkable.  Patient has been combative and fighting staff since being in the ED.  Her condition has improved after she received IV Haldol/Geodon.  She currently appears somewhat better.  She was able to name herself, her daughter, stated this was June 2020 although when asked about place, she stated [place]  When asked about alcohol use, she stated "I like to drink Diet Coke and water".  Daughter confirmed that patient has not been drinking alcohol lately.    Hospital Course:  Acute metabolic encephalopathy versus toxic encephalopathy Anxiety/depression on multiple psychiatric medications Right mastoid effusion -Patient presented with altered mental status.  Unclear source.  Could be secondary to polypharmacy as patient is on multiple psychiatric medications. -CT of the head was unremarkable except for near complete right mastoid effusion on presentation.  -MRI brain: Right middle ear and heterogeneous mastoid fluid is new since April, suspicious for acute otitis media.  No acute infarct or acute intracranial normality. -Was initially placed on Unasyn, will discharge with Augmentin  -Diet as per SLP recommendations. -Benzodiazepines including Librium held along with Ambien.  Seroquel dose has been decreased to 100 mg at night, apparently dose had been increased to 200 mg recently. -Psychiatry consultation appreciated and recommended follow-up with outpatient psychiatry continue current psychiatric medications at current doses.  No need for inpatient psychiatric hospitalization. -vitamin B12, folate, TSH levels  normal -COVID negative -PT recommended home health services -Will discharge patient with home health services/home first program  Essential Hypertension  -Continue hydralazine and metoprolol  Chronic diastolic heart failure -Compensated and euvolemic -Echo in 09/18/2018 had shown EF of 60 to 65% -Lasix on hold for now.  Strict input output.  Daily weights.  Hypokalemia -Resolved with replacement.  Repeat BMP in 1 week  History of CVA -No signs of focal neurologic.  Continue aspirin and statin. -MRI as above  Paroxysmal A. fib -Currently rate controlled -Continue metoprolol, aspirin -Patient not on anticoagulation given history of falls  Hypothyroidism  -Continue Synthroid  Chronic kidney disease, stage III -creatinine currently stable and at baseline  Chronic normocytic anemia -Hemoglobin stable at baseline  Generalized deconditioning -Overall prognosis is guarded to poor.  Patient is on multiple medications for her psychiatric condition.  Still full code.    Goals of care -Palliative care consulted and appreciated, patient transition to DNR -She has been broken hearted since the loss of her husband back in December, they were married for 59 years.  Code status: DNR  Procedures: none  Consultations: Palliative care  Discharge Exam: Vitals:   06/05/19 2131 06/06/19 0513  BP: (!) 167/77 (!) 158/75  Pulse: 85 74  Resp: 16 16  Temp: 99 F (37.2 C) 98.1 F (36.7 C)  SpO2: 96% 96%     General: Well developed, well nourished, NAD, appears stated age  HEENT: NCAT, mucous membranes moist.  Cardiovascular: S1 S2 auscultated, RRR  Respiratory: Clear to auscultation  bilaterally with equal chest rise  Abdomen: Soft, nontender, nondistended, + bowel sounds  Extremities: warm dry without cyanosis clubbing or edema  Neuro: AAOx3, nonfocal  Psych: Normal affect and demeanor  Discharge Instructions Discharge Instructions    Discharge instructions    Complete by: As directed    Patient will be discharged to home with home health/ Home first program.  Patient will need to follow up with primary care provider within one week of discharge.  Patient should continue medications as prescribed.  Patient should follow a heart healthy diet.     Allergies as of 06/06/2019      Reactions   Tetanus Toxoids Swelling, Other (See Comments)   Arm (site) became swollen as a child   Snake Antivenin [antivenin Crotalidae Polyvalent] Other (See Comments)   Just can't take per daughter   Yellow Dyes (non-tartrazine) Itching, Other (See Comments)   Makes patient nervous       Medication List    STOP taking these medications   cephALEXin 250 MG capsule Commonly known as: KEFLEX   pantoprazole 40 MG tablet Commonly known as: PROTONIX   saccharomyces boulardii 250 MG capsule Commonly known as: FLORASTOR   senna 8.6 MG Tabs tablet Commonly known as: SENOKOT     TAKE these medications   acetaminophen 325 MG tablet Commonly known as: TYLENOL Take 1-2 tablets (325-650 mg total) by mouth every 4 (four) hours as needed for mild pain.   amoxicillin-clavulanate 875-125 MG tablet Commonly known as: AUGMENTIN Take 1 tablet by mouth every 12 (twelve) hours. What changed:   when to take this  additional instructions   aspirin EC 81 MG tablet Take 81 mg by mouth daily.   chlordiazePOXIDE 5 MG capsule Commonly known as: LIBRIUM Take 1 capsule (5 mg total) by mouth 2 (two) times daily as needed for anxiety.   FLUoxetine 20 MG capsule Commonly known as: PROZAC Take 1 capsule (20 mg total) by mouth daily.   fluticasone 50 MCG/ACT nasal spray Commonly known as: FLONASE Place 1 spray into both nostrils daily. What changed:   when to take this  reasons to take this   folic acid 1 MG tablet Commonly known as: FOLVITE Take 1 tablet (1 mg total) by mouth daily.   furosemide 20 MG tablet Commonly known as: LASIX Take 1 tablet (20 mg total)  by mouth daily.   hydrALAZINE 25 MG tablet Commonly known as: APRESOLINE Take 3 tablets (75 mg total) by mouth every 8 (eight) hours. What changed: when to take this   hyoscyamine 0.375 MG 12 hr tablet Commonly known as: LEVBID Take 1 tablet (0.375 mg total) by mouth every 12 (twelve) hours as needed for cramping.   ketotifen 0.025 % ophthalmic solution Commonly known as: ZADITOR Place 1 drop into both eyes 2 (two) times daily. What changed:   when to take this  reasons to take this   levothyroxine 100 MCG tablet Commonly known as: SYNTHROID Take 1 tablet (100 mcg total) by mouth daily at 6 (six) AM.   metoprolol succinate 25 MG 24 hr tablet Commonly known as: TOPROL-XL Take 3 tablets (75 mg total) by mouth daily.   oxymetazoline 0.05 % nasal spray Commonly known as: AFRIN Place 1 spray into both nostrils 2 (two) times daily as needed for congestion.   QUEtiapine 25 MG tablet Commonly known as: SEROQUEL Take 1 tablet (25 mg total) by mouth at bedtime. What changed: how much to take   simvastatin 10 MG tablet Commonly  known as: ZOCOR Take 1 tablet (10 mg total) by mouth daily at 6 PM.   thiamine 100 MG tablet Take 1 tablet (100 mg total) by mouth daily.   venlafaxine XR 37.5 MG 24 hr capsule Commonly known as: EFFEXOR-XR Take 1 capsule (37.5 mg total) by mouth daily with breakfast.   zolpidem 5 MG tablet Commonly known as: AMBIEN Take 1 tablet (5 mg total) by mouth at bedtime as needed for sleep.      Allergies  Allergen Reactions   Tetanus Toxoids Swelling and Other (See Comments)    Arm (site) became swollen as a child   Snake Antivenin [Antivenin Crotalidae Polyvalent] Other (See Comments)    Just can't take per daughter   Yellow Dyes (Non-Tartrazine) Itching and Other (See Comments)    Makes patient nervous    Follow-up Information    Polite, Jori Moll, MD. Schedule an appointment as soon as possible for a visit in 1 week(s).   Specialty: Internal  Medicine Why: Hospital follow up Contact information: [address] [place] [address] [phone]            The results of significant diagnostics from this hospitalization (including imaging, microbiology, ancillary and laboratory) are listed below for reference.    Significant Diagnostic Studies: Ct Head Wo Contrast  Result Date: 06/03/2019 CLINICAL DATA:  Altered level of consciousness.  No reported injury. EXAM: CT HEAD WITHOUT CONTRAST TECHNIQUE: Contiguous axial images were obtained from the base of the skull through the vertex without intravenous contrast. COMPARISON:  03/29/2019 head CT. FINDINGS: Brain: No evidence of parenchymal hemorrhage or extra-axial fluid collection. No mass lesion, mass effect, or midline shift. No CT evidence of acute infarction. Nonspecific mild subcortical and periventricular white matter hypodensity, most in keeping with chronic small vessel ischemic change. Cerebral volume is age appropriate. No ventriculomegaly. Vascular: No acute abnormality. Skull: No evidence of calvarial fracture. Sinuses/Orbits: The visualized paranasal sinuses are essentially clear. Other: Near complete right mastoid effusion. Clear left mastoid air cells. IMPRESSION: 1.  No evidence of acute intracranial abnormality. 2. Mild chronic small vessel ischemic changes in cerebral white matter. 3. Near complete right mastoid effusion, nonspecific. Electronically Signed   By: Ilona Sorrel M.D.   On: 06/03/2019 05:07   Mr Brain Wo Contrast  Result Date: 06/05/2019 CLINICAL DATA:  83 year old female with altered mental status. Hallucinations. Paroxysmal atrial fibrillation not on anticoagulation secondary to falls. EXAM: MRI HEAD WITHOUT CONTRAST TECHNIQUE: Multiplanar, multiecho pulse sequences of the brain and surrounding structures were obtained without intravenous contrast. COMPARISON:  Head CT 06/03/2019.  Brain MRI 03/30/2019 and earlier. FINDINGS: Brain: No  restricted diffusion to suggest acute infarction. No midline shift, mass effect, evidence of mass lesion, ventriculomegaly, extra-axial collection or acute intracranial hemorrhage. Cervicomedullary junction and pituitary are within normal limits. Intermittent mild motion artifact today. Patchy bilateral cerebral white matter T2 and FLAIR hyperintensity, and T2 heterogeneity in the deep gray matter nuclei, brainstem, and deep left cerebellum are stable. No cortical encephalomalacia or chronic cerebral blood products. No new signal abnormality. Vascular: Major intracranial vascular flow voids are stable. Skull and upper cervical spine: Negative visible cervical spine. Visualized bone marrow signal is within normal limits. Sinuses/Orbits: Stable and negative. Other: New right mastoid effusion since April. The right tympanic cavity also appears to be fluid-filled. Associated mildly heterogeneous diffusion, and there is a corresponding to darker T2 material in the right mastoid on series 6, image 3. Mastoid diffusion negative visible nasopharynx. The left mastoids  remain clear. Scalp and face soft tissues appear negative. IMPRESSION: 1. Right middle ear and heterogeneous mastoid fluid is new since April and suspicious for Acute Otitis Media. There were no aggressive bony changes to suggest a purulent mastoiditis on the CT two days ago. 2. No acute infarct or other acute intracranial abnormality. Stable noncontrast MRI appearance of the brain since 03/30/2019. Electronically Signed   By: Genevie Ann M.D.   On: 06/05/2019 12:26   Dg Chest Port 1 View  Result Date: 06/03/2019 CLINICAL DATA:  83 y/o  F; weakness. EXAM: PORTABLE CHEST 1 VIEW COMPARISON:  03/30/2019 chest radiograph. FINDINGS: Stable cardiomegaly given projection and technique. Plate and screw fixation of the left lateral clavicle. Left shoulder hemiarthroplasty. Thoracolumbar fusion apparatus, partially visualized. Aortic atherosclerosis with calcification.  Diffuse bronchitic changes. No focal consolidation. No pleural effusion or pneumothorax. No acute osseous abnormality is evident. IMPRESSION: Stable cardiomegaly. Diffuse bronchitic changes. No focal consolidation. Electronically Signed   By: Kristine Garbe M.D.   On: 06/03/2019 05:07    Microbiology: Recent Results (from the past 240 hour(s))  Novel Coronavirus,NAA,(SEND-OUT TO REF LAB - TAT 24-48 hrs); Hosp Order     Status: None   Collection Time: 06/03/19  9:45 AM   Specimen: Respiratory  Result Value Ref Range Status   SARS-CoV-2, NAA NOT DETECTED NOT DETECTED Final    Comment: (NOTE) Testing was performed using the cobas(R) SARS-CoV-2 test. This test was developed and its performance characteristics determined by [employer]. This test has not been FDA cleared or approved. This test has been authorized by FDA under an Emergency Use Authorization (EUA). This test is only authorized for the duration of time the declaration that circumstances exist justifying the authorization of the emergency use of in vitro diagnostic tests for detection of SARS-CoV-2 virus and/or diagnosis of COVID-19 infection under section 564(b)(1) of the Act, 21 U.S.C. 147WGN-5(A)(2), unless the authorization is terminated or revoked sooner. When diagnostic testing is negative, the possibility of a false negative result should be considered in the context of a patient's recent exposures and the presence of clinical signs and symptoms consistent with COVID-19. An individual without symptoms of COVID-19 and who is not shedding SARS-CoV-2 virus would expect to have  a negative (not detected) result in this assay. Performed At: [facility] [address] [place], [place] [id number] Rush Farmer MD ON:[id number]    [place]  Final    Comment: Performed at [facility], [place] [address] [place], [place] [address]     Labs: Basic Metabolic  Panel: Recent Labs  Lab 06/03/19 0420 06/03/19 1015 06/04/19 0237 06/05/19 0311 06/06/19 0301  NA 139  --  142 141 142  K 3.9  --  3.6 3.4* 3.9  CL 104  --  109 109 111  CO2 24  --  24 24 22   GLUCOSE 111*  --  99 108* 110*  BUN 30*  --  20 16 15   CREATININE 1.18* 1.10* 1.06* 1.33* 1.33*  CALCIUM 8.7*  --  8.7* 8.7* 8.6*  MG  --   --   --  1.8 1.7   Liver Function Tests: Recent Labs  Lab 06/03/19 0420 06/05/19 0311  AST 24 17  ALT 25 20  ALKPHOS 87 86  BILITOT 0.4 0.6  PROT 6.0* 5.6*  ALBUMIN 3.1* 2.8*   No results for input(s): LIPASE, AMYLASE in the last 168 hours. Recent Labs  Lab 06/03/19 0432  AMMONIA 36*   CBC:  Recent Labs  Lab 06/03/19 0420 06/03/19 1015 06/04/19 0237 06/05/19 0311 06/06/19 0301  WBC 8.0 8.1 8.0 7.7 8.4  NEUTROABS 5.7  --   --  5.4 5.9  HGB 11.0* 10.5* 10.3* 10.4* 9.6*  HCT 34.3* 34.1* 32.7* 33.2* 31.0*  MCV 94.0 96.6 93.2 94.6 96.3  PLT 135* 106* 126* 145* 145*   Cardiac Enzymes: No results for input(s): CKTOTAL, CKMB, CKMBINDEX, TROPONINI in the last 168 hours. BNP: BNP (last 3 results) No results for input(s): BNP in the last 8760 hours.  ProBNP (last 3 results) No results for input(s): PROBNP in the last 8760 hours.  CBG: Recent Labs  Lab 06/03/19 0454  GLUCAP 102*       Signed:  Shiana Rappleye  [facility] 06/06/2019, 11:58 AM

## 2019-06-06 NOTE — Progress Notes (Signed)
NCM spoke with pt/daughter about home health services [facility] 1st program). Agreeable to program. Referral made for Home 1st program. [facility] liaison made aware. Whitman Hero RN,BSN, CM

## 2019-06-06 NOTE — Consult Note (Signed)
   [facility] CM Inpatient Consult   06/06/2019  MIKEISHA LEMONDS June 16, 1936 [id number]    Update Note:  Notified by transition of care RN CM that patient's disposition is home with home health services (per therapy recommendation) and was agreeable to [place] program. Referral has been made to [place] for the [employer] with [facility]. [facility] liaison being made aware and will follow-up as appropriate.   For questions and additional information, please call:  Suhailah Kwan A. Naleah Kofoed, BSN, RN-BC [facility] Liaison Cell: [phone]

## 2019-06-06 NOTE — Progress Notes (Signed)
Physical Therapy Treatment Patient Details Name: [name] [name] MRN: [id number] DOB: July 04, 1936 Today's Date: 06/06/2019    History of Present Illness 83 year old female with history of CVA, CAD, paroxysmal A. fib not on anticoagulation secondary to falls, hypertension, chronic diastolic CHF, anxiety/depression on multiple psychiatric medications, hypothyroidism presented with altered mental status on 06/03/2019.  Patient is on multiple psychiatric medications.  In the ED, CT of the head was unremarkable.  Patient was combative and fighting staff in the ED    PT Comments    Patient seen for mobility progression. Pt is pleasant and eager to mobilize. Pt is making progress toward PT goals and tolerated gait training distance of 120 ft with RW and min guard assist. Pt demonstrates generalized weakness and unsteady gait however no LOB. Pt will continue to benefit from further skilled PT services to maximize independence and safety with mobility. Current plan remains appropriate if pt's daughter can provide 24 hour supervision/assistance upon d/c.     Follow Up Recommendations  Home health PT;Supervision/Assistance - 24 hour     Equipment Recommendations  None recommended by PT(pt reports having RW at home--uses at basline)    Recommendations for Other Services       Precautions / Restrictions Precautions Precautions: Fall Restrictions Weight Bearing Restrictions: No    Mobility  Bed Mobility Overal bed mobility: Needs Assistance Bed Mobility: Supine to Sit     Supine to sit: Supervision     General bed mobility comments: supervision for safety; HOB elevated; pt reports she sleeps on her couch  Transfers Overall transfer level: Needs assistance Equipment used: Rolling walker (2 wheeled) Transfers: Sit to/from Stand Sit to Stand: Min guard         General transfer comment: cues for safe hand placement; assist to stabilize RW   Ambulation/Gait Ambulation/Gait assistance:  Min guard Gait Distance (Feet): 120 Feet Assistive device: Rolling walker (2 wheeled) Gait Pattern/deviations: Step-through pattern;Decreased stride length;Decreased dorsiflexion - right;Decreased dorsiflexion - left Gait velocity: decreased   General Gait Details: pt with unsteady gait and poor coordination but no LOB during session; min guard for safety; pt with safe use of RW; pt reports shakiness at baseline   Stairs             Wheelchair Mobility    Modified Rankin (Stroke Patients Only)       Balance Overall balance assessment: Needs assistance Sitting-balance support: Feet supported Sitting balance-Leahy Scale: Fair     Standing balance support: Bilateral upper extremity supported;During functional activity Standing balance-Leahy Scale: Poor                              Cognition Arousal/Alertness: Awake/alert Behavior During Therapy: WFL for tasks assessed/performed Overall Cognitive Status: Within Functional Limits for tasks assessed                                        Exercises      General Comments        Pertinent Vitals/Pain Pain Assessment: No/denies pain    Home Living                      Prior Function            PT Goals (current goals can now be found in the care plan section) Acute Rehab PT Goals  Patient Stated Goal: go home Progress towards PT goals: Progressing toward goals    Frequency    Min 3X/week      PT Plan Current plan remains appropriate    Co-evaluation              AM-PAC PT "6 Clicks" Mobility   Outcome Measure  Help needed turning from your back to your side while in a flat bed without using bedrails?: A Little Help needed moving from lying on your back to sitting on the side of a flat bed without using bedrails?: A Little Help needed moving to and from a bed to a chair (including a wheelchair)?: A Little Help needed standing up from a chair using your arms  (e.g., wheelchair or bedside chair)?: A Little Help needed to walk in hospital room?: A Little Help needed climbing 3-5 steps with a railing? : A Little 6 Click Score: 18    End of Session Equipment Utilized During Treatment: Gait belt Activity Tolerance: Patient tolerated treatment well Patient left: in chair;with call bell/phone within reach;with chair alarm set Nurse Communication: Mobility status PT Visit Diagnosis: Unsteadiness on feet (R26.81)     Time: 0160-1093 PT Time Calculation (min) (ACUTE ONLY): 27 min  Charges:  $Gait Training: 23-37 mins                     Earney Navy, PTA Acute Rehabilitation Services Pager: [phone] Office: [phone]     Darliss Cheney 06/06/2019, 10:25 AM

## 2019-06-07 NOTE — Consult Note (Signed)
   [facility] CM Inpatient Consult   06/07/2019  [name] [name] [birth year] [id number]  Follow up: Referral for [facility]  Received message from [facility] with [facility].  He states patient did not want the [facility] services as she just wanted the RN and PT for home health care.  She states her daughter is available to provide her ADL needs 24/7.  For questions, please contact:  Natividad Brood, RN BSN [facility] Liaison  [phone] business mobile phone Toll free office [phone]  Fax number: [phone] [email or web address]

## 2019-06-26 ENCOUNTER — Encounter: Payer: Medicare Other | Admitting: Physical Medicine & Rehabilitation

## 2019-08-10 DIAGNOSIS — I5030 Unspecified diastolic (congestive) heart failure: Secondary | ICD-10-CM | POA: Diagnosis not present

## 2019-08-10 DIAGNOSIS — N183 Chronic kidney disease, stage 3 (moderate): Secondary | ICD-10-CM | POA: Diagnosis not present

## 2019-08-10 DIAGNOSIS — I10 Essential (primary) hypertension: Secondary | ICD-10-CM | POA: Diagnosis not present

## 2019-08-10 DIAGNOSIS — E7800 Pure hypercholesterolemia, unspecified: Secondary | ICD-10-CM | POA: Diagnosis not present

## 2019-08-10 DIAGNOSIS — K767 Hepatorenal syndrome: Secondary | ICD-10-CM | POA: Diagnosis not present

## 2019-08-10 DIAGNOSIS — M189 Osteoarthritis of first carpometacarpal joint, unspecified: Secondary | ICD-10-CM | POA: Diagnosis not present

## 2019-08-10 DIAGNOSIS — I129 Hypertensive chronic kidney disease with stage 1 through stage 4 chronic kidney disease, or unspecified chronic kidney disease: Secondary | ICD-10-CM | POA: Diagnosis not present

## 2019-08-10 DIAGNOSIS — E039 Hypothyroidism, unspecified: Secondary | ICD-10-CM | POA: Diagnosis not present

## 2019-09-10 DIAGNOSIS — E7800 Pure hypercholesterolemia, unspecified: Secondary | ICD-10-CM | POA: Diagnosis not present

## 2019-09-10 DIAGNOSIS — K767 Hepatorenal syndrome: Secondary | ICD-10-CM | POA: Diagnosis not present

## 2019-09-10 DIAGNOSIS — N183 Chronic kidney disease, stage 3 (moderate): Secondary | ICD-10-CM | POA: Diagnosis not present

## 2019-09-10 DIAGNOSIS — I5030 Unspecified diastolic (congestive) heart failure: Secondary | ICD-10-CM | POA: Diagnosis not present

## 2019-09-10 DIAGNOSIS — E039 Hypothyroidism, unspecified: Secondary | ICD-10-CM | POA: Diagnosis not present

## 2019-09-10 DIAGNOSIS — I129 Hypertensive chronic kidney disease with stage 1 through stage 4 chronic kidney disease, or unspecified chronic kidney disease: Secondary | ICD-10-CM | POA: Diagnosis not present

## 2019-09-10 DIAGNOSIS — I10 Essential (primary) hypertension: Secondary | ICD-10-CM | POA: Diagnosis not present

## 2019-09-10 DIAGNOSIS — M189 Osteoarthritis of first carpometacarpal joint, unspecified: Secondary | ICD-10-CM | POA: Diagnosis not present

## 2019-09-24 IMAGING — DX CHEST - 2 VIEW
2 series · 2 of 2 positions shown · non-contrast
Comparison: Radiographs March 29, 2019.

CLINICAL DATA: Fever.

EXAM:
CHEST - 2 VIEW

[chest lat]
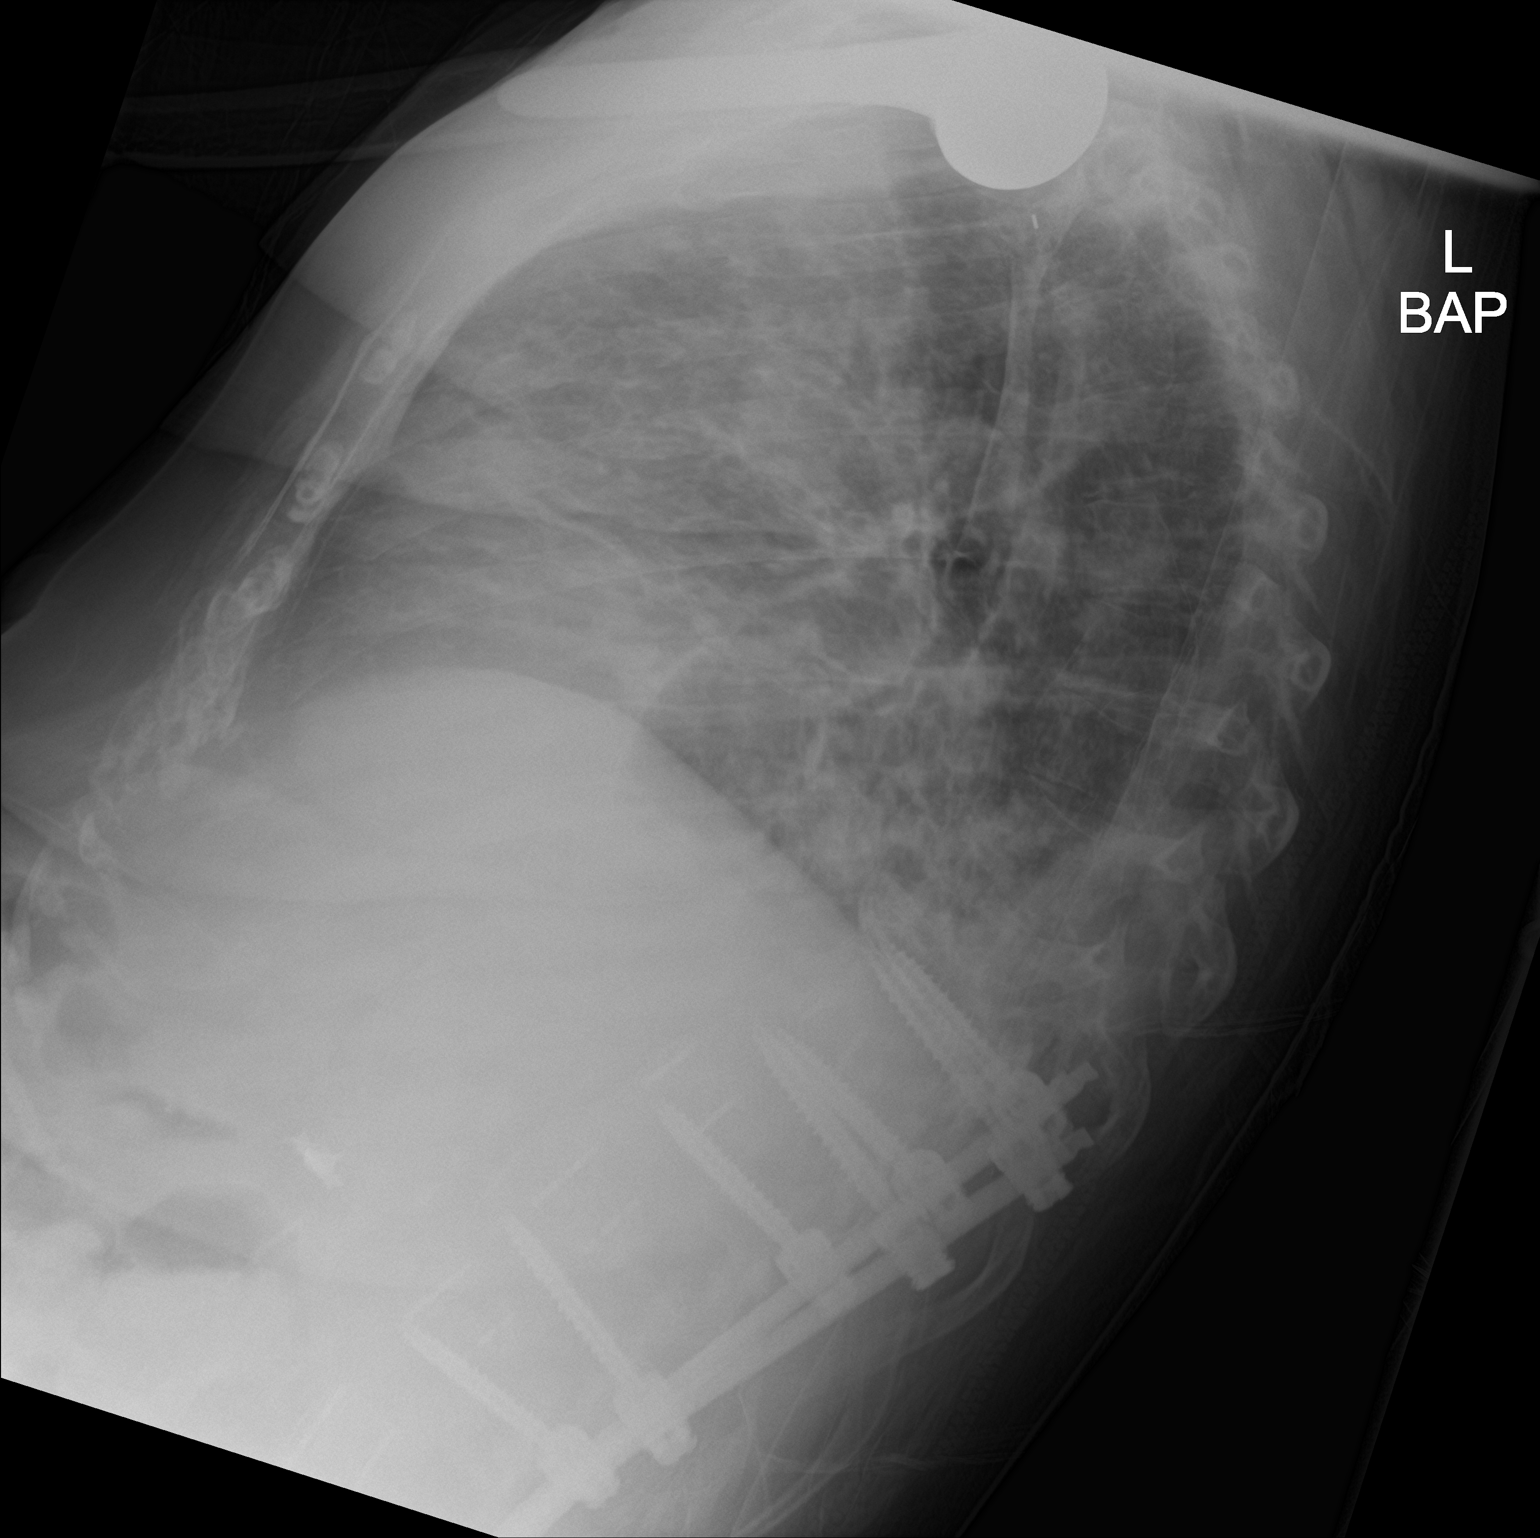

[chest ap]
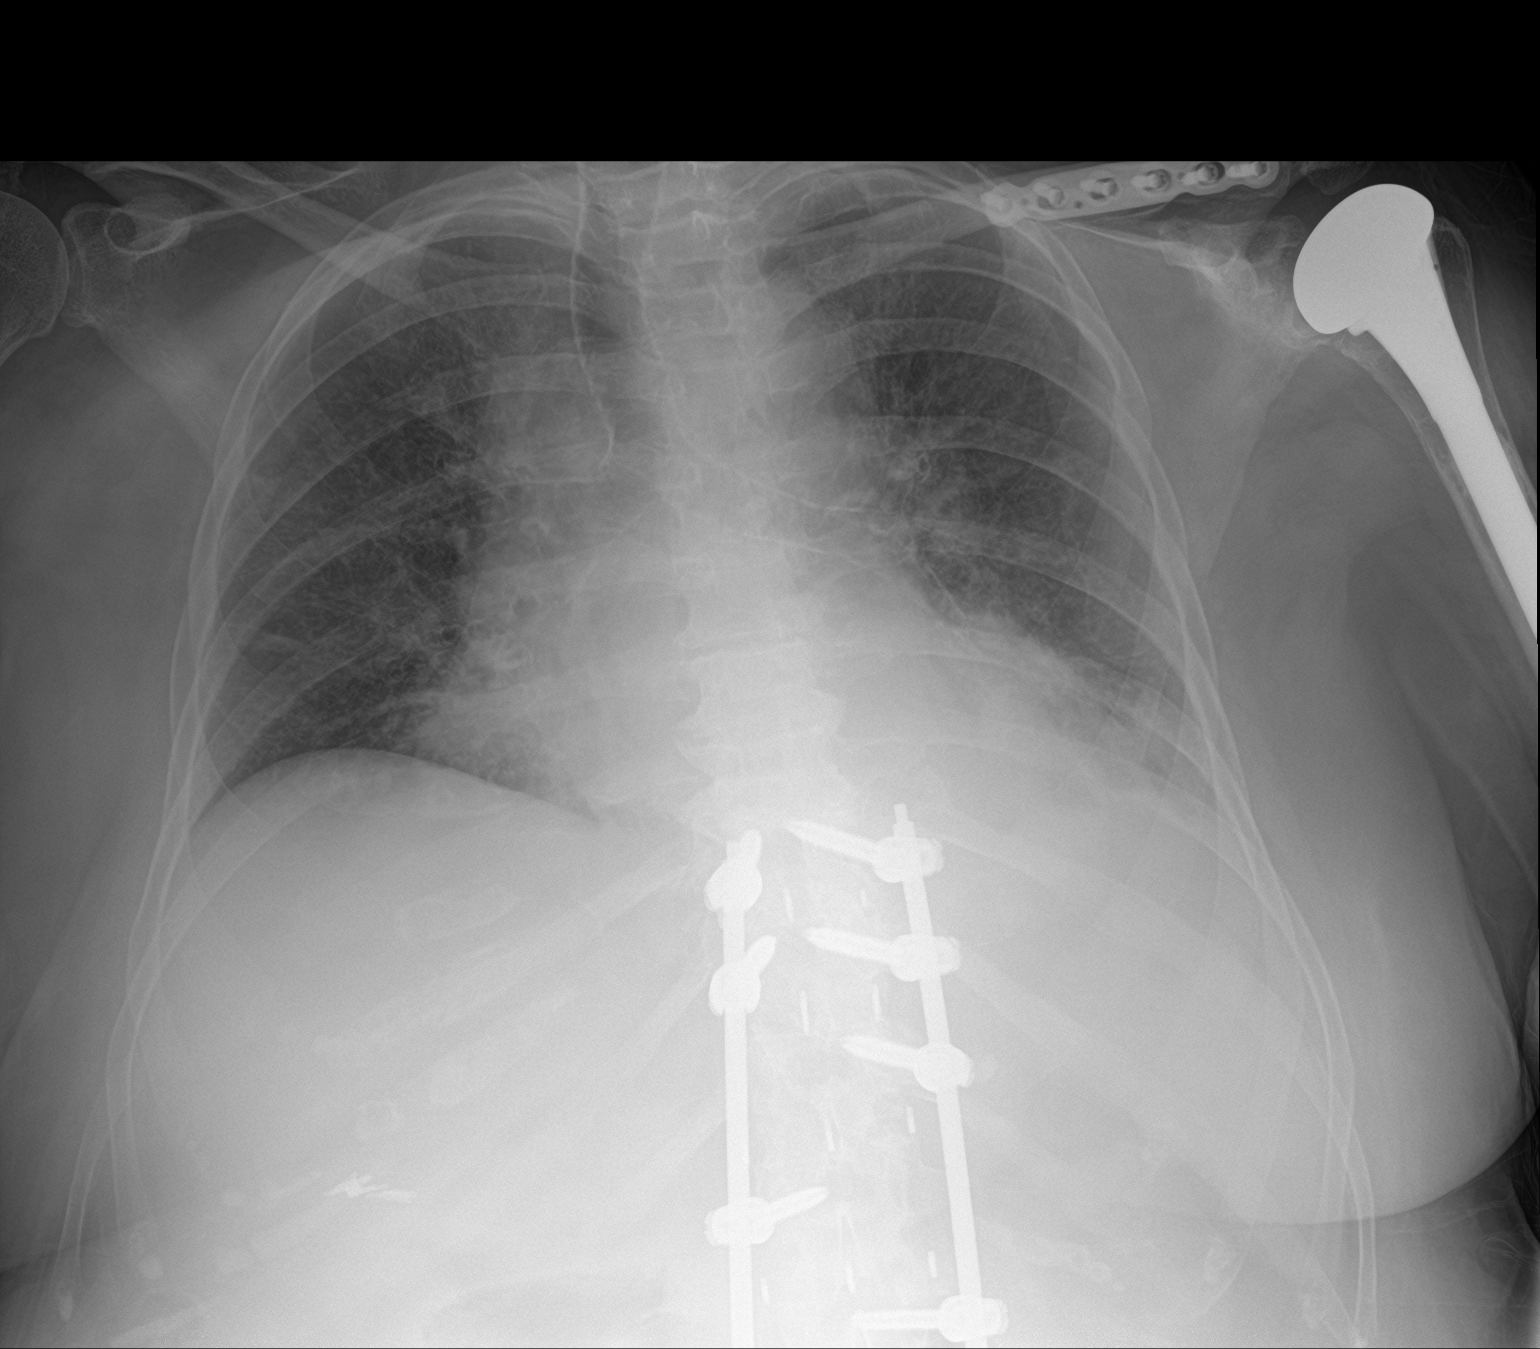

[2 of 2 positions shown; findings below may reference images not displayed]

FINDINGS: Stable cardiomegaly. Mild central pulmonary vascular congestion is
noted. Stable left basilar atelectasis or infiltrate is noted with
small pleural effusion. No pneumothorax is noted. Status post
surgical repair of old left clavicular fracture. Status post left
shoulder arthroplasty. Postsurgical changes are seen involving the
lower thoracic spine.
IMPRESSION: Stable cardiomegaly with mild central pulmonary vascular congestion
stable left basilar subsegmental atelectasis or infiltrate is noted
with small left pleural effusion.

## 2019-09-25 DIAGNOSIS — I129 Hypertensive chronic kidney disease with stage 1 through stage 4 chronic kidney disease, or unspecified chronic kidney disease: Secondary | ICD-10-CM | POA: Diagnosis not present

## 2019-09-25 DIAGNOSIS — E7800 Pure hypercholesterolemia, unspecified: Secondary | ICD-10-CM | POA: Diagnosis not present

## 2019-09-25 DIAGNOSIS — I10 Essential (primary) hypertension: Secondary | ICD-10-CM | POA: Diagnosis not present

## 2019-09-25 DIAGNOSIS — E039 Hypothyroidism, unspecified: Secondary | ICD-10-CM | POA: Diagnosis not present

## 2019-10-17 ENCOUNTER — Encounter (HOSPITAL_COMMUNITY): Payer: Self-pay | Admitting: Emergency Medicine

## 2019-10-17 ENCOUNTER — Inpatient Hospital Stay (HOSPITAL_COMMUNITY)
Admission: EM | Admit: 2019-10-17 | Discharge: 2019-10-24 | DRG: 948 | Disposition: A | Discharge status: Skilled Nursing Facility | Payer: Medicare Other | Attending: Internal Medicine | Admitting: Internal Medicine

## 2019-10-17 ENCOUNTER — Other Ambulatory Visit: Payer: Self-pay

## 2019-10-17 DIAGNOSIS — Z888 Allergy status to other drugs, medicaments and biological substances status: Secondary | ICD-10-CM

## 2019-10-17 DIAGNOSIS — E039 Hypothyroidism, unspecified: Secondary | ICD-10-CM | POA: Diagnosis present

## 2019-10-17 DIAGNOSIS — Z79890 Hormone replacement therapy (postmenopausal): Secondary | ICD-10-CM

## 2019-10-17 DIAGNOSIS — Z96652 Presence of left artificial knee joint: Secondary | ICD-10-CM | POA: Diagnosis present

## 2019-10-17 DIAGNOSIS — I440 Atrioventricular block, first degree: Secondary | ICD-10-CM | POA: Diagnosis present

## 2019-10-17 DIAGNOSIS — I739 Peripheral vascular disease, unspecified: Secondary | ICD-10-CM | POA: Diagnosis present

## 2019-10-17 DIAGNOSIS — I130 Hypertensive heart and chronic kidney disease with heart failure and stage 1 through stage 4 chronic kidney disease, or unspecified chronic kidney disease: Secondary | ICD-10-CM | POA: Diagnosis present

## 2019-10-17 DIAGNOSIS — I482 Chronic atrial fibrillation, unspecified: Secondary | ICD-10-CM | POA: Diagnosis present

## 2019-10-17 DIAGNOSIS — Z9102 Food additives allergy status: Secondary | ICD-10-CM

## 2019-10-17 DIAGNOSIS — Z887 Allergy status to serum and vaccine status: Secondary | ICD-10-CM

## 2019-10-17 DIAGNOSIS — L719 Rosacea, unspecified: Secondary | ICD-10-CM | POA: Diagnosis present

## 2019-10-17 DIAGNOSIS — F419 Anxiety disorder, unspecified: Secondary | ICD-10-CM | POA: Diagnosis present

## 2019-10-17 DIAGNOSIS — R531 Weakness: Secondary | ICD-10-CM | POA: Diagnosis not present

## 2019-10-17 DIAGNOSIS — E669 Obesity, unspecified: Secondary | ICD-10-CM | POA: Diagnosis present

## 2019-10-17 DIAGNOSIS — I129 Hypertensive chronic kidney disease with stage 1 through stage 4 chronic kidney disease, or unspecified chronic kidney disease: Secondary | ICD-10-CM | POA: Diagnosis not present

## 2019-10-17 DIAGNOSIS — Z66 Do not resuscitate: Secondary | ICD-10-CM | POA: Diagnosis present

## 2019-10-17 DIAGNOSIS — R2681 Unsteadiness on feet: Secondary | ICD-10-CM | POA: Diagnosis not present

## 2019-10-17 DIAGNOSIS — Z20828 Contact with and (suspected) exposure to other viral communicable diseases: Secondary | ICD-10-CM | POA: Diagnosis present

## 2019-10-17 DIAGNOSIS — Z818 Family history of other mental and behavioral disorders: Secondary | ICD-10-CM

## 2019-10-17 DIAGNOSIS — K219 Gastro-esophageal reflux disease without esophagitis: Secondary | ICD-10-CM | POA: Diagnosis present

## 2019-10-17 DIAGNOSIS — E785 Hyperlipidemia, unspecified: Secondary | ICD-10-CM | POA: Diagnosis present

## 2019-10-17 DIAGNOSIS — Z8249 Family history of ischemic heart disease and other diseases of the circulatory system: Secondary | ICD-10-CM

## 2019-10-17 DIAGNOSIS — K767 Hepatorenal syndrome: Secondary | ICD-10-CM | POA: Diagnosis not present

## 2019-10-17 DIAGNOSIS — Z801 Family history of malignant neoplasm of trachea, bronchus and lung: Secondary | ICD-10-CM

## 2019-10-17 DIAGNOSIS — G4700 Insomnia, unspecified: Secondary | ICD-10-CM | POA: Diagnosis present

## 2019-10-17 DIAGNOSIS — Z8673 Personal history of transient ischemic attack (TIA), and cerebral infarction without residual deficits: Secondary | ICD-10-CM

## 2019-10-17 DIAGNOSIS — W19XXXA Unspecified fall, initial encounter: Secondary | ICD-10-CM | POA: Diagnosis present

## 2019-10-17 DIAGNOSIS — N183 Chronic kidney disease, stage 3 unspecified: Secondary | ICD-10-CM | POA: Diagnosis present

## 2019-10-17 DIAGNOSIS — I480 Paroxysmal atrial fibrillation: Secondary | ICD-10-CM | POA: Diagnosis present

## 2019-10-17 DIAGNOSIS — F0390 Unspecified dementia without behavioral disturbance: Secondary | ICD-10-CM | POA: Diagnosis present

## 2019-10-17 DIAGNOSIS — I5032 Chronic diastolic (congestive) heart failure: Secondary | ICD-10-CM | POA: Diagnosis present

## 2019-10-17 DIAGNOSIS — Z96612 Presence of left artificial shoulder joint: Secondary | ICD-10-CM | POA: Diagnosis present

## 2019-10-17 DIAGNOSIS — E7800 Pure hypercholesterolemia, unspecified: Secondary | ICD-10-CM | POA: Diagnosis not present

## 2019-10-17 DIAGNOSIS — D696 Thrombocytopenia, unspecified: Secondary | ICD-10-CM | POA: Diagnosis present

## 2019-10-17 DIAGNOSIS — N1832 Chronic kidney disease, stage 3b: Secondary | ICD-10-CM | POA: Diagnosis present

## 2019-10-17 DIAGNOSIS — R296 Repeated falls: Secondary | ICD-10-CM | POA: Diagnosis present

## 2019-10-17 DIAGNOSIS — D72829 Elevated white blood cell count, unspecified: Secondary | ICD-10-CM | POA: Diagnosis present

## 2019-10-17 DIAGNOSIS — Z7982 Long term (current) use of aspirin: Secondary | ICD-10-CM

## 2019-10-17 DIAGNOSIS — L4050 Arthropathic psoriasis, unspecified: Secondary | ICD-10-CM | POA: Diagnosis present

## 2019-10-17 DIAGNOSIS — R404 Transient alteration of awareness: Secondary | ICD-10-CM | POA: Diagnosis not present

## 2019-10-17 DIAGNOSIS — Z6832 Body mass index (BMI) 32.0-32.9, adult: Secondary | ICD-10-CM

## 2019-10-17 DIAGNOSIS — F329 Major depressive disorder, single episode, unspecified: Secondary | ICD-10-CM | POA: Diagnosis present

## 2019-10-17 DIAGNOSIS — R6510 Systemic inflammatory response syndrome (SIRS) of non-infectious origin without acute organ dysfunction: Secondary | ICD-10-CM | POA: Diagnosis present

## 2019-10-17 DIAGNOSIS — I5030 Unspecified diastolic (congestive) heart failure: Secondary | ICD-10-CM | POA: Diagnosis not present

## 2019-10-17 DIAGNOSIS — I10 Essential (primary) hypertension: Secondary | ICD-10-CM | POA: Diagnosis not present

## 2019-10-17 DIAGNOSIS — K449 Diaphragmatic hernia without obstruction or gangrene: Secondary | ICD-10-CM | POA: Diagnosis present

## 2019-10-17 DIAGNOSIS — M189 Osteoarthritis of first carpometacarpal joint, unspecified: Secondary | ICD-10-CM | POA: Diagnosis not present

## 2019-10-17 DIAGNOSIS — R410 Disorientation, unspecified: Secondary | ICD-10-CM | POA: Diagnosis not present

## 2019-10-17 DIAGNOSIS — Z79899 Other long term (current) drug therapy: Secondary | ICD-10-CM

## 2019-10-17 DIAGNOSIS — Z23 Encounter for immunization: Secondary | ICD-10-CM

## 2019-10-17 DIAGNOSIS — R0902 Hypoxemia: Secondary | ICD-10-CM | POA: Diagnosis not present

## 2019-10-17 LAB — URINALYSIS, ROUTINE W REFLEX MICROSCOPIC
Bilirubin Urine: NEGATIVE
Glucose, UA: NEGATIVE mg/dL
Hgb urine dipstick: NEGATIVE
Ketones, ur: NEGATIVE mg/dL
Leukocytes,Ua: NEGATIVE
Nitrite: NEGATIVE
Protein, ur: >=300 mg/dL — AB
Specific Gravity, Urine: 1.009 (ref 1.005–1.030)
pH: 5 (ref 5.0–8.0)

## 2019-10-17 LAB — BASIC METABOLIC PANEL
Anion gap: 12 (ref 5–15)
BUN: 23 mg/dL (ref 8–23)
CO2: 19 mmol/L — ABNORMAL LOW (ref 22–32)
Calcium: 9.5 mg/dL (ref 8.9–10.3)
Chloride: 108 mmol/L (ref 98–111)
Creatinine, Ser: 1.1 mg/dL — ABNORMAL HIGH (ref 0.44–1.00)
GFR calc Af Amer: 55 mL/min — ABNORMAL LOW (ref >60)
GFR calc non Af Amer: 47 mL/min — ABNORMAL LOW (ref >60)
Glucose, Bld: 118 mg/dL — ABNORMAL HIGH (ref 70–99)
Potassium: 5.7 mmol/L — ABNORMAL HIGH (ref 3.5–5.1)
Sodium: 139 mmol/L (ref 135–145)

## 2019-10-17 LAB — CBC
HCT: 41.4 % (ref 36.0–46.0)
Hemoglobin: 13.5 g/dL (ref 12.0–15.0)
MCH: 29.5 pg (ref 26.0–34.0)
MCHC: 32.6 g/dL (ref 30.0–36.0)
MCV: 90.4 fL (ref 80.0–100.0)
Platelets: 129 10*3/uL — ABNORMAL LOW (ref 150–400)
RBC: 4.58 MIL/uL (ref 3.87–5.11)
RDW: 15.3 % (ref 11.5–15.5)
WBC: 11 10*3/uL — ABNORMAL HIGH (ref 4.0–10.5)
nRBC: 0 % (ref 0.0–0.2)

## 2019-10-17 MED ORDER — SODIUM CHLORIDE 0.9% FLUSH
3.0000 mL | Freq: Once | INTRAVENOUS | Status: AC
  Administered 2019-10-18: 02:00:00 3 mL via INTRAVENOUS

## 2019-10-17 NOTE — ED Triage Notes (Signed)
Patient presents to the ED by EMS with c/o per family strong odor to her urine with increased weakness x6 week. Pt is at her baseline and has no complaints and no recent falls. Hx of dementia and is not taking her meds as she should.  Denies fevers.     VSS 180/99 76 p 97% ra 20 respirations 131 cbg

## 2019-10-18 ENCOUNTER — Emergency Department (HOSPITAL_COMMUNITY): Payer: Medicare Other

## 2019-10-18 ENCOUNTER — Other Ambulatory Visit: Payer: Self-pay

## 2019-10-18 ENCOUNTER — Observation Stay (HOSPITAL_COMMUNITY): Payer: Medicare Other

## 2019-10-18 DIAGNOSIS — E039 Hypothyroidism, unspecified: Secondary | ICD-10-CM

## 2019-10-18 DIAGNOSIS — I5032 Chronic diastolic (congestive) heart failure: Secondary | ICD-10-CM

## 2019-10-18 DIAGNOSIS — N183 Chronic kidney disease, stage 3 unspecified: Secondary | ICD-10-CM

## 2019-10-18 DIAGNOSIS — D696 Thrombocytopenia, unspecified: Secondary | ICD-10-CM | POA: Diagnosis not present

## 2019-10-18 DIAGNOSIS — D72829 Elevated white blood cell count, unspecified: Secondary | ICD-10-CM

## 2019-10-18 DIAGNOSIS — R109 Unspecified abdominal pain: Secondary | ICD-10-CM | POA: Diagnosis not present

## 2019-10-18 DIAGNOSIS — R531 Weakness: Secondary | ICD-10-CM | POA: Diagnosis not present

## 2019-10-18 DIAGNOSIS — R6510 Systemic inflammatory response syndrome (SIRS) of non-infectious origin without acute organ dysfunction: Secondary | ICD-10-CM | POA: Diagnosis present

## 2019-10-18 DIAGNOSIS — F0390 Unspecified dementia without behavioral disturbance: Secondary | ICD-10-CM | POA: Diagnosis present

## 2019-10-18 LAB — TSH: TSH: 2.658 u[IU]/mL (ref 0.350–4.500)

## 2019-10-18 LAB — POTASSIUM: Potassium: 5.1 mmol/L (ref 3.5–5.1)

## 2019-10-18 LAB — C-REACTIVE PROTEIN: CRP: 1.7 mg/dL — ABNORMAL HIGH (ref <1.0)

## 2019-10-18 LAB — SARS CORONAVIRUS 2 (TAT 6-24 HRS): SARS Coronavirus 2: NEGATIVE

## 2019-10-18 LAB — CBG MONITORING, ED: Glucose-Capillary: 114 mg/dL — ABNORMAL HIGH (ref 70–99)

## 2019-10-18 LAB — SEDIMENTATION RATE: Sed Rate: 31 mm/hr — ABNORMAL HIGH (ref 0–22)

## 2019-10-18 MED ORDER — ACETAMINOPHEN 325 MG PO TABS
650.0000 mg | ORAL_TABLET | Freq: Four times a day (QID) | ORAL | Status: DC | PRN
  Administered 2019-10-21 – 2019-10-24 (×6): 650 mg via ORAL
  Filled 2019-10-18 (×6): qty 2

## 2019-10-18 MED ORDER — SODIUM CHLORIDE 0.9 % IV SOLN
INTRAVENOUS | Status: DC
  Administered 2019-10-18 (×2): via INTRAVENOUS

## 2019-10-18 MED ORDER — IOHEXOL 300 MG/ML  SOLN
100.0000 mL | Freq: Once | INTRAMUSCULAR | Status: AC | PRN
  Administered 2019-10-18: 100 mL via INTRAVENOUS

## 2019-10-18 MED ORDER — ACETAMINOPHEN 650 MG RE SUPP: 650.0000 mg | Freq: Four times a day (QID) | RECTAL | Status: DC | PRN

## 2019-10-18 MED ORDER — ALBUTEROL SULFATE (2.5 MG/3ML) 0.083% IN NEBU: 2.5000 mg | INHALATION_SOLUTION | Freq: Four times a day (QID) | RESPIRATORY_TRACT | Status: DC | PRN

## 2019-10-18 MED ORDER — HYOSCYAMINE SULFATE ER 0.375 MG PO TB12
0.3750 mg | ORAL_TABLET | Freq: Two times a day (BID) | ORAL | Status: DC | PRN
  Filled 2019-10-18: qty 1

## 2019-10-18 MED ORDER — ONDANSETRON HCL 4 MG PO TABS: 4.0000 mg | ORAL_TABLET | Freq: Four times a day (QID) | ORAL | Status: DC | PRN

## 2019-10-18 MED ORDER — SODIUM CHLORIDE 0.9 % IV BOLUS (SEPSIS)
1000.0000 mL | Freq: Once | INTRAVENOUS | Status: AC
  Administered 2019-10-18: 1000 mL via INTRAVENOUS

## 2019-10-18 MED ORDER — SODIUM CHLORIDE 0.9 % IV SOLN
INTRAVENOUS | Status: DC
  Administered 2019-10-18: 08:00:00 via INTRAVENOUS

## 2019-10-18 MED ORDER — ONDANSETRON HCL 4 MG/2ML IJ SOLN
4.0000 mg | Freq: Four times a day (QID) | INTRAMUSCULAR | Status: DC | PRN
  Administered 2019-10-19 – 2019-10-20 (×3): 4 mg via INTRAVENOUS
  Filled 2019-10-18 (×3): qty 2

## 2019-10-18 MED ORDER — SODIUM CHLORIDE 0.9% FLUSH
3.0000 mL | Freq: Two times a day (BID) | INTRAVENOUS | Status: DC
  Administered 2019-10-19 – 2019-10-21 (×4): 3 mL via INTRAVENOUS

## 2019-10-18 MED ORDER — ENOXAPARIN SODIUM 40 MG/0.4ML ~~LOC~~ SOLN
40.0000 mg | SUBCUTANEOUS | Status: DC
  Administered 2019-10-18 – 2019-10-21 (×4): 40 mg via SUBCUTANEOUS
  Filled 2019-10-18 (×4): qty 0.4

## 2019-10-18 MED ORDER — LEVOTHYROXINE SODIUM 100 MCG PO TABS
100.0000 ug | ORAL_TABLET | Freq: Every day | ORAL | Status: DC
  Administered 2019-10-19 – 2019-10-24 (×5): 100 ug via ORAL
  Filled 2019-10-18 (×5): qty 1

## 2019-10-18 NOTE — ED Notes (Signed)
This NT attempted to get pt out of bed, pt was not able to sit up to side of bed in order to walk. RN and MD notified.

## 2019-10-18 NOTE — Progress Notes (Signed)
NEW ADMISSION NOTE New Admission Note:   Arrival Method:E.D Stretcher bed Mental Orientation: Alert and oriented x 4 Telemetry: #18 Called and confirmed Assessment: Completed Skin:Blanchable redness on both buttocks and red ,dry flaky skin at the right lower neck.  IV: Left hand infusing with  NS @75  cc /hr Pain:Denies Tubes:None Safety Measures: Safety Fall Prevention Plan has been given, discussed and signed Admission: Completed [facility] Orientation: Patient has been orientated to the room, unit and staff.  Family:None at the bedside.  Orders have been reviewed and implemented. Will continue to monitor the patient. Call light has been placed within reach and bed alarm has been activated.   [place], Zenon Mayo, RN

## 2019-10-18 NOTE — ED Notes (Signed)
Lunch Tray Ordered @ 1031. 

## 2019-10-18 NOTE — ED Notes (Signed)
Unable to reach daughter by EDP and RN multiple times to update on patient's condition .

## 2019-10-18 NOTE — ED Notes (Signed)
Pt stated she could not eat and could not finish the lunch tray. Pt placed on purewick. Pt aware UA needed.

## 2019-10-18 NOTE — Evaluation (Addendum)
Physical Therapy Evaluation Patient Details Name: [name] [name] MRN: [id number] DOB: September 18, 1936 Today's Date: 10/18/2019   History of Present Illness  83 year old dementia here with generalized weakness possible UTI.  Exam significant for signs of dehydration.  Patient has mild hyperkalemia and is received some IV fluids.  PMH positive for CVA, CAD, PAF, HTN, CHF, anxiety/depression.  Clinical Impression  Patient presents with decreased mobility due to generalized weakness, decreased balance, decreased activity tolerance and extremely fearful of falling with high fall risk.  Currently min to mod A for mobility in her room.  States daughter can assist at home, but interested in STSNF level rehab as feel it helped her in the past.  Would recommend SNF to maximize safety and independence prior to d/c home.  If cannot be placed due to admission status would recommend HHPT and 24 hour assist and HHaide.  PT to follow.     Follow Up Recommendations SNF    Equipment Recommendations  None recommended by PT    Recommendations for Other Services       Precautions / Restrictions Precautions Precautions: Fall      Mobility  Bed Mobility Overal bed mobility: Needs Assistance Bed Mobility: Supine to Sit;Sit to Supine     Supine to sit: Mod assist;HOB elevated Sit to supine: Mod assist   General bed mobility comments: assist for scooting hips and lifting trunk; to supine assist for legs onto bed  Transfers Overall transfer level: Needs assistance Equipment used: Rolling walker (2 wheeled) Transfers: Sit to/from Stand Sit to Stand: Mod assist;From elevated surface;Min assist         General transfer comment: higher seat due to stretcher in ED; assist for safety and balance; up from chair in room no armrests assist for anterior weight shift due to posterior bias  Ambulation/Gait Ambulation/Gait assistance: Min assist Gait Distance (Feet): 12 Feet(x 2 in room) Assistive device: Rolling  walker (2 wheeled) Gait Pattern/deviations: Step-to pattern;Step-through pattern;Decreased stride length;Wide base of support;Trunk flexed;Shuffle     General Gait Details: splayed out with trunk flexion and feet apart with significant fear of  falling  Stairs            Wheelchair Mobility    Modified Rankin (Stroke Patients Only)       Balance Overall balance assessment: Needs assistance Sitting-balance support: Feet unsupported Sitting balance-Leahy Scale: Poor Sitting balance - Comments: edge of bed with feet unsupported leaning R min to mod A for balance, but seated in chair in room without armrests no UE support needed. Postural control: Posterior lean Standing balance support: Bilateral upper extremity supported Standing balance-Leahy Scale: Poor Standing balance comment: UE support and min A for balance due to posterior bias; assisted to change brief in standing due to soiled with urine min A for balance                             Pertinent Vitals/Pain Pain Assessment: Faces Faces Pain Scale: Hurts little more Pain Location: R flank Pain Descriptors / Indicators: Grimacing;Guarding Pain Intervention(s): Monitored during session;Repositioned    Home Living Family/patient expects to be discharged to:: Skilled nursing facility                      Prior Function Level of Independence: Needs assistance   Gait / Transfers Assistance Needed: ambulates with RW  ADL's / Homemaking Assistance Needed: reports daughter moved in and was helping some with  her ADL's        Hand Dominance        Extremity/Trunk Assessment   Upper Extremity Assessment Upper Extremity Assessment: Generalized weakness    Lower Extremity Assessment Lower Extremity Assessment: Generalized weakness    Cervical / Trunk Assessment Cervical / Trunk Assessment: Kyphotic  Communication   Communication: HOH  Cognition Arousal/Alertness: Awake/alert Behavior  During Therapy: WFL for tasks assessed/performed Overall Cognitive Status: History of cognitive impairments - at baseline                                 General Comments: knew where she was and able to give information on home set up, but could not recall then name of where she went to rehab after last hospitalization (reported connected to the hospital)      General Comments General comments (skin integrity, edema, etc.): R lower leg with some errythema and slight more edema than L; both legs tender to touch    Exercises     Assessment/Plan    PT Assessment Patient needs continued PT services  PT Problem List Decreased strength;Decreased activity tolerance;Decreased mobility;Decreased balance;Decreased knowledge of use of DME;Decreased safety awareness       PT Treatment Interventions DME instruction;Gait training;Functional mobility training;Therapeutic exercise;Patient/family education;Balance training;Therapeutic activities    PT Goals (Current goals can be found in the Care Plan section)  Acute Rehab PT Goals Patient Stated Goal: agreeable to skilled rehab PT Goal Formulation: With patient Time For Goal Achievement: 11/01/19 Potential to Achieve Goals: Good    Frequency Min 3X/week   Barriers to discharge        Co-evaluation               AM-PAC PT "6 Clicks" Mobility  Outcome Measure Help needed turning from your back to your side while in a flat bed without using bedrails?: A Lot Help needed moving from lying on your back to sitting on the side of a flat bed without using bedrails?: A Lot Help needed moving to and from a bed to a chair (including a wheelchair)?: A Little Help needed standing up from a chair using your arms (e.g., wheelchair or bedside chair)?: A Lot Help needed to walk in hospital room?: A Little Help needed climbing 3-5 steps with a railing? : A Lot 6 Click Score: 14    End of Session   Activity Tolerance: Patient limited  by fatigue Patient left: with call bell/phone within reach;in bed   PT Visit Diagnosis: Other abnormalities of gait and mobility (R26.89);Muscle weakness (generalized) (M62.81)    Time: [id number] PT Time Calculation (min) (ACUTE ONLY): 27 min   Charges:   PT Evaluation $PT Eval Moderate Complexity: 1 Mod PT Treatments $Gait Training: 8-22 mins        Magda Kiel, [place] [facility] [phone] 10/18/2019   Reginia Naas 10/18/2019, 1:09 PM

## 2019-10-18 NOTE — ED Notes (Signed)
DNR copied and put into medical records drawer

## 2019-10-18 NOTE — ED Notes (Signed)
PT at bedside.

## 2019-10-18 NOTE — ED Provider Notes (Signed)
Signout from Dr. Leonides Schanz.  83 year old dementia here with generalized weakness possible UTI.  Exam significant for signs of dehydration.  Patient has mild hyperkalemia and is received some IV fluids. Physical Exam  BP (!) 156/68   Pulse 80   Temp 97.7 F (36.5 C) (Axillary)   Resp 20   SpO2 92%   Physical Exam  ED Course/Procedures     Procedures  MDM  Plan is to complete with family regarding if the patient is at her baseline or not.  If she is at baseline she can be discharged and if not at baseline then she may need a medical admission for further work-up and possible placement.  I was able to reach the patient's daughter on her cell phone.  It sounds like over the past month she has had a little more difficulty with walking but was acutely worse since yesterday where she could not even get out of bed.  She has been urinating in the bed.  No reported falls.  She said the patient has had dementia and very severe memory loss since the summer.  I updated her daughter on her lab work and her need for admission.  Daughter confirmed CODE STATUS is DNR/DNI.  9 AM discussed with [facility] hospitalist Dr. Tamala Julian who accepts the patient for admission.       [name] Rasmussen, MD 10/18/19 [phone]

## 2019-10-18 NOTE — H&P (Addendum)
History and Physical    [name] [name]:[id number] DOB: 1936/09/09 DOA: 10/17/2019  Referring MD/NP/PA: Aletta Edouard, MD PCP: [name], MD  Patient coming from: home via EMS  Chief Complaint: Weakness  I have personally briefly reviewed patient's old medical records in [place]   HPI: [name] [name] is a 83 y.o. female with medical history significant of diastolic CHF, proximal atrial fibrillation, CKD, thyroid disease, and reports of dementia.  She obtained from the patient and in talks with her daughter over the phone.  Over the last month patient has had a progressive decline.  At baseline she is cared for by her daughter and previously could ambulate with use of a walker.  Over this last month she has went from needing assistance to stand, but thereafter being able to ambulate.  Yesterday, she was unable to stand with assistance or get out of bed.  She stated her legs not working.  Patient has been having urinary incontinence, but notes that she feels urge to urinate.  Associated symptoms include reports of falls, rash of the right side of neck, generalized abdominal pain, and decreased p.o. intake.  Daughter made note that had similar symptoms with previous urinary tract infections and had been complaining of left groin pain.  Denies having any significant fever, shortness of breath, chest pain, vomiting, weight loss, or diarrhea symptoms.  Her daughter also notes that her memory is declining and she is very forgetful.   ED Course: Upon admission into the emergency department patient was afebrile, respirations 16-22, blood pressures elevated up to 194/93, and all other vital signs stable. CT scan of the brain did not reveal any acute abnormalities. Labs significant for WBC 11, potassium 5.7-> 5.1, BUN 23, and creatinine 1.1.  Urinalysis noted elevated protein, but no signficant signs of infection.  Chest x-ray noted stable cardiomegaly.    Review of Systems  Constitutional:  Positive for malaise/fatigue. Negative for fever.  HENT: Positive for hearing loss. Negative for nosebleeds.   Eyes: Negative for photophobia and pain.  Respiratory: Negative for cough and shortness of breath.   Cardiovascular: Negative for chest pain and leg swelling.  Gastrointestinal: Positive for abdominal pain. Negative for vomiting.  Genitourinary: Negative for dysuria and hematuria.  Musculoskeletal: Negative for falls.  Skin: Positive for itching and rash.  Neurological: Positive for weakness. Negative for headaches.  Endo/Heme/Allergies: Negative for polydipsia.  Psychiatric/Behavioral: Positive for memory loss. Negative for substance abuse.    Past Medical History:  Diagnosis Date  . Alopecia   . Anemia    hx of years ago   . Anxiety   . ARF (acute renal failure) ([place]) 08/23/2014  . Arthritis    psoriatic arthritis  . Back pain   . Blood transfusion    at age of 4  . Carotid stenosis 12/2016  . Chronic diastolic CHF (congestive heart failure) ([place])   . Depression   . Dysrhythmia 11/13/2016   PAFib for a short time- Echo done 11/14/16 PAF- to follow up with PCP- to see if she is a candiate for anticoags.  Not started at times time due to alcohol abuse.  [name] GERD (gastroesophageal reflux disease)   . H/O hiatal hernia   . History of acute cholangitis   . History of GI diverticular bleed   . Hyperlipidemia   . Hypertension   . Hypothyroidism   . IBS (irritable bowel syndrome)   . Pancreatitis   . Peripheral vascular disease ([place])   . PONV (  postoperative nausea and vomiting)    with Breast reduction- only time  . Rosacea   . Seizures ([place]) 11/13/2016   Thopught to be from withdrawl Benzodiazepine  . Stroke [facility])    patient denies-  . Thyroid disease     Past Surgical History:  Procedure Laterality Date  . ABDOMINAL HYSTERECTOMY     partial  . BACK SURGERY  2010   thoractic-screws and rods  . BILE DUCT STENT PLACEMENT  08/26/2014  . BREAST REDUCTION SURGERY     . CHOLECYSTECTOMY  1988   lap  . COLONOSCOPY    . COLONOSCOPY Left 09/08/2014   Procedure: COLONOSCOPY;  Surgeon: Arta Silence, MD;  Location[facility] ENDOSCOPY;  Service: Endoscopy;  Laterality: Left;  . ENDARTERECTOMY Left 12/28/2016  . ENDARTERECTOMY Left 12/28/2016   Procedure: ENDARTERECTOMY CAROTID LEFT WITH XENOSURE BIOLOGIC PATCH ANGIOPLASTY;  Surgeon: Angelia Mould, MD;  Location: [place];  Service: Vascular;  Laterality: Left;  . ERCP    . ERCP N/A 08/26/2014   Procedure: ENDOSCOPIC RETROGRADE CHOLANGIOPANCREATOGRAPHY (ERCP);  Surgeon: Jeryl Columbia, MD;  Location[facility] ENDOSCOPY;  Service: Endoscopy;  Laterality: N/A;  . ERCP N/A 09/11/2014   Procedure: ENDOSCOPIC RETROGRADE CHOLANGIOPANCREATOGRAPHY (ERCP);  Surgeon: Arta Silence, MD;  Location: [facility] ENDOSCOPY;  Service: Endoscopy;  Laterality: N/A;  . EUS N/A 09/11/2014   Procedure: ESOPHAGEAL ENDOSCOPIC ULTRASOUND (EUS) RADIAL;  Surgeon: Arta Silence, MD;  Location: [facility] ENDOSCOPY;  Service: Endoscopy;  Laterality: N/A;  . EYE SURGERY Bilateral    Cataract  . JOINT REPLACEMENT  2005   left shoulder replacement   . ORIF CLAVICULAR FRACTURE Left 07/18/2014   Procedure: OPEN REDUCTION INTERNAL FIXATION (ORIF) LEFT CLAVICULAR FRACTURE;  Surgeon: Ninetta Lights, MD;  Location: [place];  Service: Orthopedics;  Laterality: Left;  . SPYGLASS CHOLANGIOSCOPY N/A 09/11/2014   Procedure: SPYGLASS CHOLANGIOSCOPY;  Surgeon: Arta Silence, MD;  Location: [facility] ENDOSCOPY;  Service: Endoscopy;  Laterality: N/A;  . TONSILLECTOMY  as child  . TOTAL KNEE ARTHROPLASTY Left 03/10/2016   Procedure: LEFT TOTAL KNEE ARTHROPLASTY;  Surgeon: Ninetta Lights, MD;  Location: [place];  Service: Orthopedics;  Laterality: Left;  [name] VENTRAL HERNIA REPAIR  06/08/2012   Procedure: LAPAROSCOPIC VENTRAL HERNIA;  Surgeon: Adin Hector, MD;  Location: [facility] ORS;  Service: General;  Laterality: Right;     reports that she has never smoked. She has never  used smokeless tobacco. She reports current alcohol use of about 7.0 standard drinks of alcohol per week. She reports that she does not use drugs.  Allergies  Allergen Reactions  . Tetanus Toxoids Swelling and Other (See Comments)    Arm (site) became swollen as a child  . Snake Antivenin [Antivenin Crotalidae Polyvalent] Other (See Comments)    Just can't take per daughter  . Yellow Dyes (Non-Tartrazine) Itching and Other (See Comments)    Makes patient nervous     Family History  Problem Relation Age of Onset  . Heart disease Mother   . Cancer Father        lung  . Bipolar disorder Brother     Prior to Admission medications   Medication Sig Start Date End Date Taking? Authorizing Provider  acetaminophen (TYLENOL) 325 MG tablet Take 1-2 tablets (325-650 mg total) by mouth every 4 (four) hours as needed for mild pain. 04/03/19   Love, Ivan Anchors, PA-C  amoxicillin-clavulanate (AUGMENTIN) 875-125 MG tablet Take 1 tablet by mouth every 12 (twelve) hours. 06/06/19   Cristal Ford,  DO  aspirin EC 81 MG tablet Take 81 mg by mouth daily.    [provider]  chlordiazePOXIDE (LIBRIUM) 5 MG capsule Take 1 capsule (5 mg total) by mouth 2 (two) times daily as needed for anxiety. 04/03/19   Love, Ivan Anchors, PA-C  FLUoxetine (PROZAC) 20 MG capsule Take 1 capsule (20 mg total) by mouth daily. 04/03/19   Love, Ivan Anchors, PA-C  fluticasone (FLONASE) 50 MCG/ACT nasal spray Place 1 spray into both nostrils daily. Patient taking differently: Place 1 spray into both nostrils daily as needed for allergies.  04/04/19   Love, Ivan Anchors, PA-C  folic acid (FOLVITE) 1 MG tablet Take 1 tablet (1 mg total) by mouth daily. 04/03/19   Love, Ivan Anchors, PA-C  furosemide (LASIX) 20 MG tablet Take 1 tablet (20 mg total) by mouth daily. 04/03/19   Love, Ivan Anchors, PA-C  hydrALAZINE (APRESOLINE) 25 MG tablet Take 3 tablets (75 mg total) by mouth every 8 (eight) hours. Patient taking differently: Take 75 mg by mouth 2  (two) times a day.  04/03/19   Love, Ivan Anchors, PA-C  hyoscyamine (LEVBID) 0.375 MG 12 hr tablet Take 1 tablet (0.375 mg total) by mouth every 12 (twelve) hours as needed for cramping. 04/03/19   Love, Ivan Anchors, PA-C  ketotifen (ZADITOR) 0.025 % ophthalmic solution Place 1 drop into both eyes 2 (two) times daily. Patient taking differently: Place 1 drop into both eyes 2 (two) times daily as needed (itchy eye relief).  04/03/19   Love, Ivan Anchors, PA-C  levothyroxine (SYNTHROID) 100 MCG tablet Take 1 tablet (100 mcg total) by mouth daily at 6 (six) AM. 04/04/19   Love, Ivan Anchors, PA-C  metoprolol succinate (TOPROL-XL) 25 MG 24 hr tablet Take 3 tablets (75 mg total) by mouth daily. 04/04/19   Love, Ivan Anchors, PA-C  oxymetazoline (AFRIN) 0.05 % nasal spray Place 1 spray into both nostrils 2 (two) times daily as needed for congestion.    [provider]  QUEtiapine (SEROQUEL) 25 MG tablet Take 1 tablet (25 mg total) by mouth at bedtime. Patient taking differently: Take 200 mg by mouth at bedtime.  04/03/19   Love, Ivan Anchors, PA-C  simvastatin (ZOCOR) 10 MG tablet Take 1 tablet (10 mg total) by mouth daily at 6 PM. 04/03/19   Love, Ivan Anchors, PA-C  thiamine 100 MG tablet Take 1 tablet (100 mg total) by mouth daily. 03/14/19   Thurnell Lose, MD  venlafaxine XR (EFFEXOR-XR) 37.5 MG 24 hr capsule Take 1 capsule (37.5 mg total) by mouth daily with breakfast. 04/04/19   Love, Ivan Anchors, PA-C  zolpidem (AMBIEN) 5 MG tablet Take 1 tablet (5 mg total) by mouth at bedtime as needed for sleep. 04/03/19   Bary Leriche, PA-C    Physical Exam:  Constitutional: Elderly female who appears to be in no acute distress at this time Vitals:   10/18/19 0530 10/18/19 0600 10/18/19 0630 10/18/19 0828  BP: (!) 193/81 (!) 192/75 (!) 156/68 (!) 174/83  Pulse: 78 80 80 85  Resp: _0 Temp:      TempSrc:      SpO2: 96% 96% 92% 94%   Eyes: PERRL, lids and conjunctivae normal ENMT: Mucous membranes are dry posterior  pharynx clear of any exudate or lesions.  Neck: Supple with no masses thyromegaly appreciated Respiratory: clear to auscultation bilaterally, no wheezing, no crackles. Normal respiratory effort. No accessory muscle use.  Cardiovascular: Regular rate and rhythm,  no murmurs / rubs / gallops.  Trace lower extremity edema. 2+ pedal pulses. No carotid bruits.  Abdomen: No focal tenderness appreciated, no masses palpated. No hepatosplenomegaly. Bowel sounds positive.  Musculoskeletal: no clubbing / cyanosis. No joint deformity upper and lower extremities. Good ROM, no contractures. Normal muscle tone.  Skin: Erythematous rash noted of the right side of the neck Neurologic: CN 2-12 grossly intact. Sensation intact, DTR normal. Strength 5/5 in all 4.  Psychiatric: Mild short-term memory impairment.  Alert and oriented x 3. Normal mood.     Labs on Admission: I have personally reviewed following labs and imaging studies  CBC: Recent Labs  Lab 10/17/19 1757  WBC 11.0*  HGB 13.5  HCT 41.4  MCV 90.4  PLT 941*   Basic Metabolic Panel: Recent Labs  Lab 10/17/19 1757 10/18/19 0448  NA 139  --   K 5.7* 5.1  CL 108  --   CO2 19*  --   GLUCOSE 118*  --   BUN 23  --   CREATININE 1.10*  --   CALCIUM 9.5  --    GFR: CrCl cannot be calculated (Unknown ideal weight.). Liver Function Tests: No results for input(s): AST, ALT, ALKPHOS, BILITOT, PROT, ALBUMIN in the last 168 hours. No results for input(s): LIPASE, AMYLASE in the last 168 hours. No results for input(s): AMMONIA in the last 168 hours. Coagulation Profile: No results for input(s): INR, PROTIME in the last 168 hours. Cardiac Enzymes: No results for input(s): CKTOTAL, CKMB, CKMBINDEX, TROPONINI in the last 168 hours. BNP (last 3 results) No results for input(s): PROBNP in the last 8760 hours. HbA1C: No results for input(s): HGBA1C in the last 72 hours. CBG: Recent Labs  Lab 10/18/19 0214  GLUCAP 114*   Lipid Profile: No  results for input(s): CHOL, HDL, LDLCALC, TRIG, CHOLHDL, LDLDIRECT in the last 72 hours. Thyroid Function Tests: No results for input(s): TSH, T4TOTAL, FREET4, T3FREE, THYROIDAB in the last 72 hours. Anemia Panel: No results for input(s): VITAMINB12, FOLATE, FERRITIN, TIBC, IRON, RETICCTPCT in the last 72 hours. Urine analysis:    Component Value Date/Time   COLORURINE YELLOW 10/17/2019 2135   APPEARANCEUR HAZY (A) 10/17/2019 2135   LABSPEC 1.009 10/17/2019 2135   PHURINE 5.0 10/17/2019 2135   GLUCOSEU NEGATIVE 10/17/2019 2135   HGBUR NEGATIVE 10/17/2019 2135   [place] NEGATIVE 10/17/2019 2135   KETONESUR NEGATIVE 10/17/2019 2135   PROTEINUR >=300 (A) 10/17/2019 2135   UROBILINOGEN 0.2 09/04/2014 1340   NITRITE NEGATIVE 10/17/2019 2135   LEUKOCYTESUR NEGATIVE 10/17/2019 2135   Sepsis Labs: No results found for this or any previous visit (from the past 240 hour(s)).   Radiological Exams on Admission: Ct Head Wo Contrast  Result Date: 10/18/2019 CLINICAL DATA:  83 year old female with increasing body weakness x6 weeks. History of dementia. EXAM: CT HEAD WITHOUT CONTRAST TECHNIQUE: Contiguous axial images were obtained from the base of the skull through the vertex without intravenous contrast. COMPARISON:  Head CT dated 06/03/2019 FINDINGS: Brain: There is mild age-related atrophy and moderate chronic microvascular ischemic changes. Small left lentiform nucleus old lacunar infarct. There is an 8 mm hypodense focus in the right caudate head consistent with an age indeterminate infarct, likely old. This is however new since the CT of 06/03/2019. Clinical correlation is recommended. There is no acute intracranial hemorrhage. No mass effect or midline shift. No extra-axial fluid collection. Vascular: No hyperdense vessel or unexpected calcification. Skull: Normal. Negative for fracture or focal lesion. Sinuses/Orbits: The visualized paranasal  sinuses are clear. The left mastoid air cell is  clear. There is partial opacification of the right mastoid air cells. Other: None IMPRESSION: 1. No acute intracranial hemorrhage. Age-related atrophy and chronic microvascular ischemic changes. 2. 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 Clinical correlation is recommended. Electronically Signed   By: Anner Crete M.D.   On: 10/18/2019 08:29   Dg Chest Port 1 View  Result Date: 10/18/2019 CLINICAL DATA:  Weakness EXAM: PORTABLE CHEST 1 VIEW COMPARISON:  June 03, 2019 FINDINGS: The mediastinal contour and cardiac silhouette are stable. Heart size is enlarged. There is no focal infiltrate, pulmonary edema, or pleural effusion. Patient is status post prior fixation of the left clavicle and left shoulder and lumbar spine unchanged. IMPRESSION: No acute cardiopulmonary disease identified.  Stable cardiomegaly. Electronically Signed   By: Abelardo Diesel M.D.   On: 10/18/2019 07:39    EKG: Independently reviewed.  Sinus rhythm at 78 bpm with significant amount of background artifact  Assessment/Plan Generalized weakness: Acute on chronic.  Patient presents with progressively worsening weakness now unable to stand without assistance.  Previous MRI performed back in June did not show any acute signs of stroke. -Admit to medical telemetry bed -Neurochecks -PT/OT to eval and treat -Social work consult for possible need of placement  Leukocytosis: Acute.  WBC elevated at 11 on admission with mild tachypnea.  Patient denies any complaints besides weakness.  Chest x-ray and urinalysis did not appear significantly concerning for infection.  -Check ESR, RPR, and CRP -Nursing order placed to check blood cultures if patient spikes fever -Monitor for need to start the patient on empiric antibiotics. -Recheck CBC in a.m.   Proteinuria: On admission urine protein greater than 300. -Check spot urine protein and urine creatinine  Dementia: Not formally diagnosed, but patient with increasing  short-term memory loss.   Chronic diastolic CHF: Patient does not appear grossly fluid overloaded at this time. -Continue to monitor  Hypothyroidism: Last TSH noted to be 1.413 on 06/05/2019 -Follow-up TSH -Continue levothyroxine  Chronic kidney disease stage III: Patient's creatinine appears near her baseline. -Continue to monitor  Thrombocytopenia: Chronic.  Platelet count 129. -Continue to monitor  DVT prophylaxis: Lovenox Code Status: DNR/DNI Family Communication: Plan of care discussed with the patient daughter over the phone Disposition Plan: TBD  Consults called: None Admission status: Observation  Norval Morton MD [facility] Pager [phone]   If 7PM-7AM, please contact night-coverage [email or web address] Password [email or web address]  10/18/2019, 8:59 AM

## 2019-10-18 NOTE — ED Provider Notes (Signed)
TIME SEEN: 2:06 AM  CHIEF COMPLAINT: Generalized weakness  HPI: Patient is an 83 year old female with history of chronic kidney disease, diastolic heart failure, hypertension, hyperlipidemia who presents to the emergency department with generalized weakness for the past several weeks per triage note.  No family at bedside.  Patient states she is not sure why she is here.  She states "I was out visiting and then all of a sudden they came to get me and brought me here".  She denies that she has having any pain.  Patient told nursing staff that she lives with her husband.  Patient tells me that she lives with her daughter.  Unclear if there has been any falls.    PCP - Dr. Delfina Redwood  ROS: Level 5 caveat secondary to altered mental status  PAST MEDICAL HISTORY/PAST SURGICAL HISTORY:  Past Medical History:  Diagnosis Date  . Alopecia   . Anemia    hx of years ago   . Anxiety   . ARF (acute renal failure) ([place]) 08/23/2014  . Arthritis    psoriatic arthritis  . Back pain   . Blood transfusion    at age of 4  . Carotid stenosis 12/2016  . Chronic diastolic CHF (congestive heart failure) ([place])   . Depression   . Dysrhythmia 11/13/2016   PAFib for a short time- Echo done 11/14/16 PAF- to follow up with PCP- to see if she is a candiate for anticoags.  Not started at times time due to alcohol abuse.  [name] GERD (gastroesophageal reflux disease)   . H/O hiatal hernia   . History of acute cholangitis   . History of GI diverticular bleed   . Hyperlipidemia   . Hypertension   . Hypothyroidism   . IBS (irritable bowel syndrome)   . Pancreatitis   . Peripheral vascular disease ([place])   . PONV (postoperative nausea and vomiting)    with Breast reduction- only time  . Rosacea   . Seizures ([place]) 11/13/2016   Thopught to be from withdrawl Benzodiazepine  . Stroke [facility])    patient denies-  . Thyroid disease     MEDICATIONS:  Prior to Admission medications   Medication Sig Start Date End Date  Taking? Authorizing Provider  acetaminophen (TYLENOL) 325 MG tablet Take 1-2 tablets (325-650 mg total) by mouth every 4 (four) hours as needed for mild pain. 04/03/19   Love, Ivan Anchors, PA-C  amoxicillin-clavulanate (AUGMENTIN) 875-125 MG tablet Take 1 tablet by mouth every 12 (twelve) hours. 06/06/19   Cristal Ford, DO  aspirin EC 81 MG tablet Take 81 mg by mouth daily.    [provider]  chlordiazePOXIDE (LIBRIUM) 5 MG capsule Take 1 capsule (5 mg total) by mouth 2 (two) times daily as needed for anxiety. 04/03/19   Love, Ivan Anchors, PA-C  FLUoxetine (PROZAC) 20 MG capsule Take 1 capsule (20 mg total) by mouth daily. 04/03/19   Love, Ivan Anchors, PA-C  fluticasone (FLONASE) 50 MCG/ACT nasal spray Place 1 spray into both nostrils daily. Patient taking differently: Place 1 spray into both nostrils daily as needed for allergies.  04/04/19   Love, Ivan Anchors, PA-C  folic acid (FOLVITE) 1 MG tablet Take 1 tablet (1 mg total) by mouth daily. 04/03/19   Love, Ivan Anchors, PA-C  furosemide (LASIX) 20 MG tablet Take 1 tablet (20 mg total) by mouth daily. 04/03/19   Love, Ivan Anchors, PA-C  hydrALAZINE (APRESOLINE) 25 MG tablet Take 3 tablets (75 mg total) by mouth every  8 (eight) hours. Patient taking differently: Take 75 mg by mouth 2 (two) times a day.  04/03/19   Love, Ivan Anchors, PA-C  hyoscyamine (LEVBID) 0.375 MG 12 hr tablet Take 1 tablet (0.375 mg total) by mouth every 12 (twelve) hours as needed for cramping. 04/03/19   Love, Ivan Anchors, PA-C  ketotifen (ZADITOR) 0.025 % ophthalmic solution Place 1 drop into both eyes 2 (two) times daily. Patient taking differently: Place 1 drop into both eyes 2 (two) times daily as needed (itchy eye relief).  04/03/19   Love, Ivan Anchors, PA-C  levothyroxine (SYNTHROID) 100 MCG tablet Take 1 tablet (100 mcg total) by mouth daily at 6 (six) AM. 04/04/19   Love, Ivan Anchors, PA-C  metoprolol succinate (TOPROL-XL) 25 MG 24 hr tablet Take 3 tablets (75 mg total) by mouth daily. 04/04/19    Love, Ivan Anchors, PA-C  oxymetazoline (AFRIN) 0.05 % nasal spray Place 1 spray into both nostrils 2 (two) times daily as needed for congestion.    [provider]  QUEtiapine (SEROQUEL) 25 MG tablet Take 1 tablet (25 mg total) by mouth at bedtime. Patient taking differently: Take 200 mg by mouth at bedtime.  04/03/19   Love, Ivan Anchors, PA-C  simvastatin (ZOCOR) 10 MG tablet Take 1 tablet (10 mg total) by mouth daily at 6 PM. 04/03/19   Love, Ivan Anchors, PA-C  thiamine 100 MG tablet Take 1 tablet (100 mg total) by mouth daily. 03/14/19   Thurnell Lose, MD  venlafaxine XR (EFFEXOR-XR) 37.5 MG 24 hr capsule Take 1 capsule (37.5 mg total) by mouth daily with breakfast. 04/04/19   Love, Ivan Anchors, PA-C  zolpidem (AMBIEN) 5 MG tablet Take 1 tablet (5 mg total) by mouth at bedtime as needed for sleep. 04/03/19   Bary Leriche, PA-C    ALLERGIES:  Allergies  Allergen Reactions  . Tetanus Toxoids Swelling and Other (See Comments)    Arm (site) became swollen as a child  . Snake Antivenin [Antivenin Crotalidae Polyvalent] Other (See Comments)    Just can't take per daughter  . Yellow Dyes (Non-Tartrazine) Itching and Other (See Comments)    Makes patient nervous     SOCIAL HISTORY:  Social History   Tobacco Use  . Smoking status: Never Smoker  . Smokeless tobacco: Never Used  Substance Use Topics  . Alcohol use: Yes    Alcohol/week: 7.0 standard drinks    Types: 7 Glasses of wine per week    Comment: rare    FAMILY HISTORY: Family History  Problem Relation Age of Onset  . Heart disease Mother   . Cancer Father        lung  . Bipolar disorder Brother     EXAM: BP (!) 178/83 (BP Location: Right Arm)   Pulse 78   Temp 97.7 F (36.5 C) (Axillary)   Resp 18   SpO2 98%  CONSTITUTIONAL: Alert and oriented to person and place but not time.  In no distress currently.   HEAD: Normocephalic, atraumatic EYES: Conjunctivae clear, pupils appear equal, EOMI ENT: normal nose; strongly  dry mucous membranes NECK: Supple, no meningismus, no nuchal rigidity, no LAD  CARD: RRR; S1 and S2 appreciated; no murmurs, no clicks, no rubs, no gallops RESP: Normal chest excursion without splinting or tachypnea; breath sounds clear and equal bilaterally; no wheezes, no rhonchi, no rales, no hypoxia or respiratory distress, speaking full sentences ABD/GI: Normal bowel sounds; non-distended; soft, non-tender, no rebound, no guarding, no peritoneal signs,  no hepatosplenomegaly BACK:  The back appears normal and is non-tender to palpation, there is no CVA tenderness EXT: Normal ROM in all joints; non-tender to palpation; no edema; normal capillary refill; no cyanosis, no calf tenderness or swelling    SKIN: Normal color for age and race; warm; no rash NEURO: Moves all extremities equally, reports normal sensation diffusely, cranial nerves II through XII intact, normal speech PSYCH: The patient's mood and manner are appropriate. Grooming and personal hygiene are appropriate.  MEDICAL DECISION MAKING: Patient here with generalized weakness for several weeks per triage note.  Per triage note, family reported strong smelling urine.  Concern for UTI.  No focal neurologic deficits on exam.  Doubt stroke, intracranial hemorrhage.  She denies any pain.  Mildly hypertensive but otherwise hemodynamically stable.  She appears dry on exam with dry mucous membranes.  Labs show mild hyperkalemia of 5.7 without EKG changes.  Urine does not appear infected.  Will give IV fluids, recheck potassium level.  Patient arrives by EMS.  No family at bedside.  Left message x 2 with daughter at 2:35 AM.  ED PROGRESS: Repeat potassium has improved to 5.1 after IV hydration.  I have left a second message with patient's daughter at 5:40 AM.  Patient currently has no complaints.  6:25 AM  Patient unable to stand or ambulate in the ED.  It is unclear if this is her baseline.  I have not heard back from any family members and there  is no family at bedside.  Patient appears comfortable and is not in any distress.  We will continue to get in touch with family for disposition.  7:05 AM  Will continue IV hydration.  I have still not heard back from patient's daughter and there is no family at bedside.  It is unclear if patient ambulates normally in the if this is new.  If she does ambulate normally and is this week, she may need admission for PT, OT, social work, case management, continued IV hydration as she does appear very dry on exam.  She is eating and drinking currently without difficulty.  If patient does not ambulate at baseline, I feel it would likely be reasonable to discharge her home with her family after we have ensure there is no other acute complaint that they are concerned about.  Signed out to oncoming ED physician.   I reviewed all nursing notes and pertinent previous records as available.  I have interpreted any EKGs, lab and urine results, imaging (as available).   EKG Interpretation  Date/Time:  Wednesday October 17 2019 17:31:39 EST Ventricular Rate:  78 PR Interval:  232 QRS Duration: 74 QT Interval:  400 QTC Calculation: 456 R Axis:   -25 Text Interpretation: Sinus rhythm with 1st degree A-V block Septal infarct , age undetermined Possible Lateral infarct , age undetermined Abnormal ECG Artifact Poor quality EKG Confirmed by Pryor Curia [phone]) on 10/18/2019 2:15:19 AM        [name] was evaluated in Emergency Department on 10/18/2019 for the symptoms described in the history of present illness. She was evaluated in the context of the global COVID-19 pandemic, which necessitated consideration that the patient might be at risk for infection with the SARS-CoV-2 virus that causes COVID-19. Institutional protocols and algorithms that pertain to the evaluation of patients at risk for COVID-19 are in a state of rapid change based on information released by regulatory bodies including the CDC and  federal and state organizations. These policies  and algorithms were followed during the patient's care in the ED.    , Delice Bison, DO 10/18/19 [phone]

## 2019-10-19 DIAGNOSIS — I10 Essential (primary) hypertension: Secondary | ICD-10-CM | POA: Diagnosis not present

## 2019-10-19 DIAGNOSIS — Z9102 Food additives allergy status: Secondary | ICD-10-CM | POA: Diagnosis not present

## 2019-10-19 DIAGNOSIS — R531 Weakness: Secondary | ICD-10-CM | POA: Diagnosis not present

## 2019-10-19 DIAGNOSIS — L719 Rosacea, unspecified: Secondary | ICD-10-CM | POA: Diagnosis present

## 2019-10-19 DIAGNOSIS — I700 Atherosclerosis of aorta: Secondary | ICD-10-CM | POA: Diagnosis not present

## 2019-10-19 DIAGNOSIS — M6281 Muscle weakness (generalized): Secondary | ICD-10-CM | POA: Diagnosis not present

## 2019-10-19 DIAGNOSIS — F0390 Unspecified dementia without behavioral disturbance: Secondary | ICD-10-CM | POA: Diagnosis not present

## 2019-10-19 DIAGNOSIS — Z887 Allergy status to serum and vaccine status: Secondary | ICD-10-CM | POA: Diagnosis not present

## 2019-10-19 DIAGNOSIS — E669 Obesity, unspecified: Secondary | ICD-10-CM | POA: Diagnosis not present

## 2019-10-19 DIAGNOSIS — R41841 Cognitive communication deficit: Secondary | ICD-10-CM | POA: Diagnosis not present

## 2019-10-19 DIAGNOSIS — Z79890 Hormone replacement therapy (postmenopausal): Secondary | ICD-10-CM | POA: Diagnosis not present

## 2019-10-19 DIAGNOSIS — K219 Gastro-esophageal reflux disease without esophagitis: Secondary | ICD-10-CM | POA: Diagnosis present

## 2019-10-19 DIAGNOSIS — F322 Major depressive disorder, single episode, severe without psychotic features: Secondary | ICD-10-CM | POA: Diagnosis not present

## 2019-10-19 DIAGNOSIS — Z20828 Contact with and (suspected) exposure to other viral communicable diseases: Secondary | ICD-10-CM | POA: Diagnosis present

## 2019-10-19 DIAGNOSIS — L4050 Arthropathic psoriasis, unspecified: Secondary | ICD-10-CM | POA: Diagnosis present

## 2019-10-19 DIAGNOSIS — R2681 Unsteadiness on feet: Secondary | ICD-10-CM | POA: Diagnosis not present

## 2019-10-19 DIAGNOSIS — Z23 Encounter for immunization: Secondary | ICD-10-CM | POA: Diagnosis not present

## 2019-10-19 DIAGNOSIS — R296 Repeated falls: Secondary | ICD-10-CM | POA: Diagnosis present

## 2019-10-19 DIAGNOSIS — F419 Anxiety disorder, unspecified: Secondary | ICD-10-CM | POA: Diagnosis not present

## 2019-10-19 DIAGNOSIS — K449 Diaphragmatic hernia without obstruction or gangrene: Secondary | ICD-10-CM | POA: Diagnosis present

## 2019-10-19 DIAGNOSIS — Z79899 Other long term (current) drug therapy: Secondary | ICD-10-CM | POA: Diagnosis not present

## 2019-10-19 DIAGNOSIS — N183 Chronic kidney disease, stage 3 unspecified: Secondary | ICD-10-CM | POA: Diagnosis not present

## 2019-10-19 DIAGNOSIS — Z9181 History of falling: Secondary | ICD-10-CM | POA: Diagnosis not present

## 2019-10-19 DIAGNOSIS — Z888 Allergy status to other drugs, medicaments and biological substances status: Secondary | ICD-10-CM | POA: Diagnosis not present

## 2019-10-19 DIAGNOSIS — Z8249 Family history of ischemic heart disease and other diseases of the circulatory system: Secondary | ICD-10-CM | POA: Diagnosis not present

## 2019-10-19 DIAGNOSIS — I482 Chronic atrial fibrillation, unspecified: Secondary | ICD-10-CM | POA: Diagnosis present

## 2019-10-19 DIAGNOSIS — Z66 Do not resuscitate: Secondary | ICD-10-CM | POA: Diagnosis present

## 2019-10-19 DIAGNOSIS — I130 Hypertensive heart and chronic kidney disease with heart failure and stage 1 through stage 4 chronic kidney disease, or unspecified chronic kidney disease: Secondary | ICD-10-CM | POA: Diagnosis present

## 2019-10-19 DIAGNOSIS — F323 Major depressive disorder, single episode, severe with psychotic features: Secondary | ICD-10-CM | POA: Diagnosis not present

## 2019-10-19 DIAGNOSIS — I480 Paroxysmal atrial fibrillation: Secondary | ICD-10-CM | POA: Diagnosis not present

## 2019-10-19 DIAGNOSIS — I5032 Chronic diastolic (congestive) heart failure: Secondary | ICD-10-CM | POA: Diagnosis present

## 2019-10-19 DIAGNOSIS — Z7982 Long term (current) use of aspirin: Secondary | ICD-10-CM | POA: Diagnosis not present

## 2019-10-19 DIAGNOSIS — N1832 Chronic kidney disease, stage 3b: Secondary | ICD-10-CM | POA: Diagnosis present

## 2019-10-19 DIAGNOSIS — E785 Hyperlipidemia, unspecified: Secondary | ICD-10-CM | POA: Diagnosis present

## 2019-10-19 DIAGNOSIS — I509 Heart failure, unspecified: Secondary | ICD-10-CM | POA: Diagnosis not present

## 2019-10-19 DIAGNOSIS — W19XXXA Unspecified fall, initial encounter: Secondary | ICD-10-CM | POA: Diagnosis present

## 2019-10-19 DIAGNOSIS — Z8673 Personal history of transient ischemic attack (TIA), and cerebral infarction without residual deficits: Secondary | ICD-10-CM | POA: Diagnosis not present

## 2019-10-19 DIAGNOSIS — G4700 Insomnia, unspecified: Secondary | ICD-10-CM | POA: Diagnosis not present

## 2019-10-19 DIAGNOSIS — M2550 Pain in unspecified joint: Secondary | ICD-10-CM | POA: Diagnosis not present

## 2019-10-19 DIAGNOSIS — I739 Peripheral vascular disease, unspecified: Secondary | ICD-10-CM | POA: Diagnosis present

## 2019-10-19 DIAGNOSIS — D696 Thrombocytopenia, unspecified: Secondary | ICD-10-CM | POA: Diagnosis not present

## 2019-10-19 DIAGNOSIS — Z818 Family history of other mental and behavioral disorders: Secondary | ICD-10-CM | POA: Diagnosis not present

## 2019-10-19 DIAGNOSIS — Z7401 Bed confinement status: Secondary | ICD-10-CM | POA: Diagnosis not present

## 2019-10-19 DIAGNOSIS — R278 Other lack of coordination: Secondary | ICD-10-CM | POA: Diagnosis not present

## 2019-10-19 DIAGNOSIS — E039 Hypothyroidism, unspecified: Secondary | ICD-10-CM | POA: Diagnosis present

## 2019-10-19 LAB — BASIC METABOLIC PANEL
Anion gap: 9 (ref 5–15)
BUN: 20 mg/dL (ref 8–23)
CO2: 18 mmol/L — ABNORMAL LOW (ref 22–32)
Calcium: 8.6 mg/dL — ABNORMAL LOW (ref 8.9–10.3)
Chloride: 114 mmol/L — ABNORMAL HIGH (ref 98–111)
Creatinine, Ser: 1.23 mg/dL — ABNORMAL HIGH (ref 0.44–1.00)
GFR calc Af Amer: 48 mL/min — ABNORMAL LOW (ref >60)
GFR calc non Af Amer: 41 mL/min — ABNORMAL LOW (ref >60)
Glucose, Bld: 106 mg/dL — ABNORMAL HIGH (ref 70–99)
Potassium: 4.3 mmol/L (ref 3.5–5.1)
Sodium: 141 mmol/L (ref 135–145)

## 2019-10-19 LAB — CBC
HCT: 34.2 % — ABNORMAL LOW (ref 36.0–46.0)
Hemoglobin: 10.7 g/dL — ABNORMAL LOW (ref 12.0–15.0)
MCH: 29.7 pg (ref 26.0–34.0)
MCHC: 31.3 g/dL (ref 30.0–36.0)
MCV: 95 fL (ref 80.0–100.0)
Platelets: 106 10*3/uL — ABNORMAL LOW (ref 150–400)
RBC: 3.6 MIL/uL — ABNORMAL LOW (ref 3.87–5.11)
RDW: 15.8 % — ABNORMAL HIGH (ref 11.5–15.5)
WBC: 7 10*3/uL (ref 4.0–10.5)
nRBC: 0 % (ref 0.0–0.2)

## 2019-10-19 LAB — CREATININE, URINE, RANDOM: Creatinine, Urine: 56.54 mg/dL

## 2019-10-19 LAB — VITAMIN B12: Vitamin B-12: 246 pg/mL (ref 180–914)

## 2019-10-19 LAB — RPR: RPR Ser Ql: NONREACTIVE

## 2019-10-19 LAB — PROTEIN, URINE, RANDOM: Total Protein, Urine: 88 mg/dL

## 2019-10-19 MED ORDER — ASPIRIN EC 81 MG PO TBEC
81.0000 mg | DELAYED_RELEASE_TABLET | Freq: Every morning | ORAL | Status: DC
  Administered 2019-10-20 – 2019-10-24 (×5): 81 mg via ORAL
  Filled 2019-10-19 (×5): qty 1

## 2019-10-19 MED ORDER — FUROSEMIDE 20 MG PO TABS
20.0000 mg | ORAL_TABLET | Freq: Every day | ORAL | Status: DC
  Administered 2019-10-19 – 2019-10-24 (×6): 20 mg via ORAL
  Filled 2019-10-19 (×6): qty 1

## 2019-10-19 MED ORDER — INFLUENZA VAC A&B SA ADJ QUAD 0.5 ML IM PRSY
0.5000 mL | PREFILLED_SYRINGE | INTRAMUSCULAR | Status: AC
  Administered 2019-10-24: 0.5 mL via INTRAMUSCULAR
  Filled 2019-10-19 (×2): qty 0.5

## 2019-10-19 MED ORDER — MELATONIN 3 MG PO TABS
3.0000 mg | ORAL_TABLET | Freq: Every day | ORAL | Status: DC
  Administered 2019-10-19 – 2019-10-23 (×5): 3 mg via ORAL
  Filled 2019-10-19 (×7): qty 1

## 2019-10-19 MED ORDER — HYDRALAZINE HCL 50 MG PO TABS
100.0000 mg | ORAL_TABLET | Freq: Two times a day (BID) | ORAL | Status: DC
  Administered 2019-10-19 – 2019-10-24 (×11): 100 mg via ORAL
  Filled 2019-10-19 (×11): qty 2

## 2019-10-19 MED ORDER — VITAMIN B-1 100 MG PO TABS
100.0000 mg | ORAL_TABLET | Freq: Every day | ORAL | Status: DC
  Administered 2019-10-19 – 2019-10-20 (×2): 100 mg via ORAL
  Filled 2019-10-19 (×2): qty 1

## 2019-10-19 MED ORDER — FLUOXETINE HCL 20 MG PO CAPS
20.0000 mg | ORAL_CAPSULE | Freq: Every day | ORAL | Status: DC
  Administered 2019-10-19 – 2019-10-24 (×6): 20 mg via ORAL
  Filled 2019-10-19 (×6): qty 1

## 2019-10-19 MED ORDER — FOLIC ACID 1 MG PO TABS
1.0000 mg | ORAL_TABLET | Freq: Every day | ORAL | Status: DC
  Administered 2019-10-19 – 2019-10-24 (×6): 1 mg via ORAL
  Filled 2019-10-19 (×6): qty 1

## 2019-10-19 MED ORDER — CHLORDIAZEPOXIDE HCL 5 MG PO CAPS
5.0000 mg | ORAL_CAPSULE | Freq: Every morning | ORAL | Status: DC
  Administered 2019-10-20 – 2019-10-24 (×5): 5 mg via ORAL
  Filled 2019-10-19 (×5): qty 1

## 2019-10-19 MED ORDER — METOPROLOL SUCCINATE ER 25 MG PO TB24
75.0000 mg | ORAL_TABLET | Freq: Every day | ORAL | Status: DC
  Administered 2019-10-19 – 2019-10-24 (×6): 75 mg via ORAL
  Filled 2019-10-19: qty 1
  Filled 2019-10-19: qty 3
  Filled 2019-10-19 (×3): qty 1
  Filled 2019-10-19: qty 3

## 2019-10-19 MED ORDER — MELATONIN 3 MG PO TABS
3.0000 mg | ORAL_TABLET | Freq: Once | ORAL | Status: AC
  Administered 2019-10-19: 3 mg via ORAL
  Filled 2019-10-19: qty 1

## 2019-10-19 MED ORDER — PROMETHAZINE HCL 25 MG/ML IJ SOLN
6.2500 mg | Freq: Four times a day (QID) | INTRAMUSCULAR | Status: DC | PRN
  Administered 2019-10-19: 6.25 mg via INTRAVENOUS
  Filled 2019-10-19: qty 1

## 2019-10-19 MED ORDER — SIMVASTATIN 20 MG PO TABS
20.0000 mg | ORAL_TABLET | Freq: Every day | ORAL | Status: DC
  Administered 2019-10-19 – 2019-10-23 (×5): 20 mg via ORAL
  Filled 2019-10-19 (×5): qty 1

## 2019-10-19 MED ORDER — VENLAFAXINE HCL ER 37.5 MG PO CP24
37.5000 mg | ORAL_CAPSULE | Freq: Every day | ORAL | Status: DC
  Administered 2019-10-20 – 2019-10-24 (×5): 37.5 mg via ORAL
  Filled 2019-10-19 (×6): qty 1

## 2019-10-19 MED ORDER — QUETIAPINE FUMARATE 50 MG PO TABS
200.0000 mg | ORAL_TABLET | Freq: Every day | ORAL | Status: DC
  Administered 2019-10-19 – 2019-10-23 (×5): 200 mg via ORAL
  Filled 2019-10-19 (×2): qty 2
  Filled 2019-10-19 (×2): qty 4
  Filled 2019-10-19: qty 2

## 2019-10-19 NOTE — Evaluation (Signed)
Occupational Therapy Evaluation Patient Details Name: [name] [name] MRN: [id number] DOB: 10-27-1936 Today's Date: 10/19/2019    History of Present Illness 83 year old dementia here with generalized weakness possible UTI.  Exam significant for signs of dehydration.  Patient has mild hyperkalemia and is received some IV fluids.  PMH positive for CVA, CAD, PAF, HTN, CHF, anxiety/depression.   Clinical Impression   Patient is an 83 year old female that lives in a multi level home, stays main level. Patient reports her daughter moved in with her after patient's spouse passed away. Patient reports history of falling, but hasn't fallen "in a few months," and is fearful of falling. Patient is tremulous throughout all mobility, provide cues for pursed lip breathing techniques. Min A for functional transfers with mod verbal cues for safety with body mechanics.   Follow Up Recommendations  SNF;Supervision/Assistance - 24 hour    Equipment Recommendations  Other (comment)(defer to next venue)       Precautions / Restrictions Precautions Precautions: Fall Restrictions Weight Bearing Restrictions: No      Mobility Bed Mobility Overal bed mobility: Needs Assistance Bed Mobility: Supine to Sit     Supine to sit: Min assist;Mod assist;HOB elevated     General bed mobility comments: assist for scooting hips  Transfers Overall transfer level: Needs assistance Equipment used: Rolling walker (2 wheeled) Transfers: Sit to/from Stand Sit to Stand: Min assist              Balance Overall balance assessment: Needs assistance Sitting-balance support: Feet supported Sitting balance-Leahy Scale: Fair     Standing balance support: Bilateral upper extremity supported;During functional activity Standing balance-Leahy Scale: Poor Standing balance comment: UE support and min A for balance/safety with functional transfer. patient is tremulous and has fear of falling.                            ADL either performed or assessed with clinical judgement   ADL Overall ADL's : Needs assistance/impaired     Grooming: Set up;Sitting   Upper Body Bathing: Min guard;Sitting   Lower Body Bathing: Maximal assistance;Sitting/lateral leans   Upper Body Dressing : Min guard;Sitting   Lower Body Dressing: Maximal assistance;Sitting/lateral leans Lower Body Dressing Details (indicate cue type and reason): pt very tremulous sitting at edge of bed. states fearful of falling Toilet Transfer: Minimal assistance;Ambulation;BSC Toilet Transfer Details (indicate cue type and reason): verbal cues for body mechanics  Toileting- Clothing Manipulation and Hygiene: Minimal assistance;Sit to/from stand   Tub/ Banker: Moderate assistance;Ambulation;Tub bench;Rolling walker   Functional mobility during ADLs: Minimal assistance;Cueing for safety;Rolling walker                    Pertinent Vitals/Pain Pain Assessment: No/denies pain     Hand Dominance Right   Extremity/Trunk Assessment Upper Extremity Assessment Upper Extremity Assessment: Generalized weakness   Lower Extremity Assessment Lower Extremity Assessment: Defer to PT evaluation   Cervical / Trunk Assessment Cervical / Trunk Assessment: Kyphotic   Communication Communication Communication: HOH   Cognition Arousal/Alertness: Awake/alert Behavior During Therapy: WFL for tasks assessed/performed Overall Cognitive Status: History of cognitive impairments - at baseline                                 General Comments: A/O x4              Home Living  Family/patient expects to be discharged to:: Private residence Living Arrangements: Children Available Help at Discharge: Family;Available 24 hours/day Type of Home: House Home Access: Stairs to enter [employer] of Steps: 1   Home Layout: Two level;Able to live on main level with bedroom/bathroom     Bathroom Shower/Tub:  Teacher, early years/pre: Standard Bathroom Accessibility: Yes How Accessible: Accessible via walker Home Equipment: [place] - 2 wheels;Wheelchair - manual;Tub bench          Prior Functioning/Environment Level of Independence: Needs assistance  Gait / Transfers Assistance Needed: ambulates with RW ADL's / Homemaking Assistance Needed: DTR moved in assists patient with bathing and IADLs cooking, cleaning, laundry, transportation            OT Problem List: Decreased strength;Decreased activity tolerance;Impaired balance (sitting and/or standing);Decreased safety awareness;Obesity      OT Treatment/Interventions: Self-care/ADL training;Therapeutic exercise;DME and/or AE instruction;Therapeutic activities;Patient/family education;Balance training    OT Goals(Current goals can be found in the care plan section) Acute Rehab OT Goals Patient Stated Goal: to get stronger OT Goal Formulation: With patient Time For Goal Achievement: 11/02/19 Potential to Achieve Goals: Good  OT Frequency: Min 2X/week    AM-PAC OT "6 Clicks" Daily Activity     Outcome Measure Help from another person eating meals?: None Help from another person taking care of personal grooming?: A Little Help from another person toileting, which includes using toliet, bedpan, or urinal?: A Lot Help from another person bathing (including washing, rinsing, drying)?: A Lot Help from another person to put on and taking off regular upper body clothing?: A Little Help from another person to put on and taking off regular lower body clothing?: A Lot 6 Click Score: 16   End of Session Equipment Utilized During Treatment: Gait belt;Rolling walker Nurse Communication: Mobility status  Activity Tolerance: Patient tolerated treatment well Patient left: in chair;with call bell/phone within reach;with chair alarm set  OT Visit Diagnosis: Unsteadiness on feet (R26.81);Other abnormalities of gait and mobility  (R26.89);History of falling (Z91.81);Muscle weakness (generalized) (M62.81)                Time: [id number] OT Time Calculation (min): 31 min Charges:  OT General Charges $OT Visit: 1 Visit OT Evaluation $OT Eval Moderate Complexity: 1 Mod OT Treatments $Self Care/Home Management : 8-22 mins  Shon Millet OT OT office: [place] 10/19/2019, 9:02 AM

## 2019-10-19 NOTE — Plan of Care (Signed)
  Problem: Nutrition: Goal: Adequate nutrition will be maintained Outcome: Progressing   Problem: Nutrition: Goal: Adequate nutrition will be maintained Outcome: Progressing

## 2019-10-19 NOTE — Progress Notes (Signed)
Patient's BP 186/83 HR 89. MD on call text paged. Awaiting call back. Aqueelah Cotrell, Wonda Cheng, Therapist, sports

## 2019-10-19 NOTE — Progress Notes (Addendum)
Progress Note    [name] [name]  [id number] DOB: 08-04-1936  DOA: 10/17/2019 PCP: [name], MD    Brief Narrative:     Medical records reviewed and are as summarized below:  [name] [name] is an 83 y.o. female with medical history significant of diastolic CHF, proximal atrial fibrillation, CKD, thyroid disease, and reports of dementia.  History obtained from the patient and in talks with her daughter over the phone.  Over the last month patient has had a progressive decline.  At baseline she is cared for by her daughter and previously could ambulate with use of a walker.  Over this last month she has went from needing assistance to stand, but thereafter being able to ambulate.  Yesterday, she was unable to stand with assistance or get out of bed.  She stated her legs not working.   Assessment/Plan:   Principal Problem:   Generalized weakness Active Problems:   Leukocytosis   Chronic diastolic CHF (congestive heart failure) (HCC)   Hypothyroidism   Thrombocytopenia (HCC)   Dementia (HCC)   CKD (chronic kidney disease), stage III   Generalized weakness: Acute on chronic.  Patient presents with progressively worsening weakness now unable to stand without assistance.  Previous MRI performed back in June did not show any acute signs of stroke/NPH -PT/OT- SNF -Social work consult for possible need of placement B12/B1- history of alcohol abuse (does not sound like is current) -polypharmacy probably contributing a lot  Leukocytosis: Acute.  WBC elevated at 11 on admission with mild tachypnea.  Patient denies any complaints besides weakness.  Chest x-ray and urinalysis did not appear significantly concerning for infection.  -resolved  Dementia: Not formally diagnosed, but patient with increasing short-term memory loss.  -check B12 and thiamine  Chronic diastolic CHF: Patient does not appear grossly fluid overloaded at this time. -Continue to monitor  Hypothyroidism:  Last TSH noted to be 1.413 on 06/05/2019 - TSH normal -Continue levothyroxine  Chronic kidney disease stage III: Patient's creatinine appears near her baseline. -Continue to monitor  Thrombocytopenia: Chronic.  Platelet count 129. -Continue to monitor  obesity Estimated body mass index is 32.16 kg/m as calculated from the following:   Height as of this encounter: 5\' 1"  (1.549 m).   Weight as of this encounter: 77.2 kg.  Family Communication/Anticipated D/C date and plan/Code Status   DVT prophylaxis: Lovenox ordered. Code Status: dnr  Family Communication:  Disposition Plan: needs SNF Placement   Medical Consultants:    None.    Subjective:   No appetite, some nausea when eating  Objective:    Vitals:   10/18/19 1847 10/18/19 2118 10/19/19 0507 10/19/19 0620  BP: (!) 170/74 (!) 172/70 (!) 186/83   Pulse: 91 90 89   Resp: 18 16 16    Temp: 98.1 F (36.7 C) 98.1 F (36.7 C) 98.2 F (36.8 C)   TempSrc: Oral Oral Oral   SpO2: 96% 94% 96%   Weight: 77.2 kg     Height:    5\' 1"  (1.549 m)    Intake/Output Summary (Last 24 hours) at 10/19/2019 0904 Last data filed at 10/19/2019 [id number] Gross per 24 hour  Intake 1729.92 ml  Output 325 ml  Net 1404.92 ml   Filed Weights   10/18/19 1847  Weight: 77.2 kg    Exam: In chair, chronically ill appearing rrr No increased work of breathing No LE pitting edema Flaky skin at base of neck tremulous  Data Reviewed:   I  have personally reviewed following labs and imaging studies:  Labs: Labs show the following:   Basic Metabolic Panel: Recent Labs  Lab 10/17/19 1757 10/18/19 0448 10/19/19 0647  NA 139  --  141  K 5.7* 5.1 4.3  CL 108  --  114*  CO2 19*  --  18*  GLUCOSE 118*  --  106*  BUN 23  --  20  CREATININE 1.10*  --  1.23*  CALCIUM 9.5  --  8.6*   GFR Estimated Creatinine Clearance: 33.8 mL/min (A) (by C-G formula based on SCr of 1.23 mg/dL (H)). Liver Function Tests: No results for input(s):  AST, ALT, ALKPHOS, BILITOT, PROT, ALBUMIN in the last 168 hours. No results for input(s): LIPASE, AMYLASE in the last 168 hours. No results for input(s): AMMONIA in the last 168 hours. Coagulation profile No results for input(s): INR, PROTIME in the last 168 hours.  CBC: Recent Labs  Lab 10/17/19 1757 10/19/19 0647  WBC 11.0* 7.0  HGB 13.5 10.7*  HCT 41.4 34.2*  MCV 90.4 95.0  PLT 129* 106*   Cardiac Enzymes: No results for input(s): CKTOTAL, CKMB, CKMBINDEX, TROPONINI in the last 168 hours. BNP (last 3 results) No results for input(s): PROBNP in the last 8760 hours. CBG: Recent Labs  Lab 10/18/19 0214  GLUCAP 114*   D-Dimer: No results for input(s): DDIMER in the last 72 hours. Hgb A1c: No results for input(s): HGBA1C in the last 72 hours. Lipid Profile: No results for input(s): CHOL, HDL, LDLCALC, TRIG, CHOLHDL, LDLDIRECT in the last 72 hours. Thyroid function studies: Recent Labs    10/18/19 1100  TSH 2.658   Anemia work up: No results for input(s): VITAMINB12, FOLATE, FERRITIN, TIBC, IRON, RETICCTPCT in the last 72 hours. Sepsis Labs: Recent Labs  Lab 10/17/19 1757 10/19/19 0647  WBC 11.0* 7.0    Microbiology Recent Results (from the past 240 hour(s))  Blood culture (routine x 2)     Status: None (Preliminary result)   Collection Time: 10/17/19  9:20 PM   Specimen: BLOOD RIGHT ARM  Result Value Ref Range Status   Specimen Description BLOOD RIGHT ARM  Final   Special Requests   Final    BOTTLES DRAWN AEROBIC AND ANAEROBIC Blood Culture results may not be optimal due to an excessive volume of blood received in culture bottles   Culture   Final    NO GROWTH < 24 HOURS Performed at [facility], [place] [address] [place], [place] [address]    Report Status PENDING  Incomplete  Blood culture (routine x 2)     Status: None (Preliminary result)   Collection Time: 10/17/19  9:25 PM   Specimen: BLOOD RIGHT HAND  Result Value Ref Range Status    Specimen Description BLOOD RIGHT HAND  Final   Special Requests   Final    BOTTLES DRAWN AEROBIC ONLY Blood Culture results may not be optimal due to an inadequate volume of blood received in culture bottles   Culture   Final    NO GROWTH < 24 HOURS Performed at [facility], [place] [address], [place], [place] [address]    Report Status PENDING  Incomplete  SARS CORONAVIRUS 2 (TAT 6-24 HRS) Nasopharyngeal Nasopharyngeal Swab     Status: None   Collection Time: 10/18/19  8:28 AM   Specimen: Nasopharyngeal Swab  Result Value Ref Range Status   SARS Coronavirus 2 NEGATIVE NEGATIVE Final    Comment: (NOTE) SARS-CoV-2 target nucleic acids  are NOT DETECTED. The SARS-CoV-2 RNA is generally detectable in upper and lower respiratory specimens during the acute phase of infection. Negative results do not preclude SARS-CoV-2 infection, do not rule out co-infections with other pathogens, and should not be used as the sole basis for treatment or other patient management decisions. Negative results must be combined with clinical observations, patient history, and epidemiological information. The expected result is Negative. Fact Sheet for Patients: SugarRoll.be Fact Sheet for Healthcare Providers: https://www.woods-mathews.com/ This test is not yet approved or cleared by the [place] FDA and  has been authorized for detection and/or diagnosis of SARS-CoV-2 by FDA under an Emergency Use Authorization (EUA). This EUA will remain  in effect (meaning this test can be used) for the duration of the COVID-19 declaration under Section 56 4(b)(1) of the Act, 21 U.S.C. section 360bbb-3(b)(1), unless the authorization is terminated or revoked sooner. Performed at [facility], [place] [address] [place], [place] [address]     Procedures and diagnostic studies:  Ct Head Wo Contrast  Result Date: 10/18/2019 CLINICAL DATA:  83 year old female with  increasing body weakness x6 weeks. History of dementia. EXAM: CT HEAD WITHOUT CONTRAST TECHNIQUE: Contiguous axial images were obtained from the base of the skull through the vertex without intravenous contrast. COMPARISON:  Head CT dated 06/03/2019 FINDINGS: Brain: There is mild age-related atrophy and moderate chronic microvascular ischemic changes. Small left lentiform nucleus old lacunar infarct. There is an 8 mm hypodense focus in the right caudate head consistent with an age indeterminate infarct, likely old. This is however new since the CT of 06/03/2019. Clinical correlation is recommended. There is no acute intracranial hemorrhage. No mass effect or midline shift. No extra-axial fluid collection. Vascular: No hyperdense vessel or unexpected calcification. Skull: Normal. Negative for fracture or focal lesion. Sinuses/Orbits: The visualized paranasal sinuses are clear. The left mastoid air cell is clear. There is partial opacification of the right mastoid air cells. Other: None IMPRESSION: 1. No acute intracranial hemorrhage. Age-related atrophy and chronic microvascular ischemic changes. 2. 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 Clinical correlation is recommended. Electronically Signed   By: Anner Crete M.D.   On: 10/18/2019 08:29   Ct Abdomen Pelvis W Contrast  Result Date: 10/18/2019 CLINICAL DATA:  Acute abdominal pain.  Urinary tract infection. EXAM: CT ABDOMEN AND PELVIS WITH CONTRAST TECHNIQUE: Multidetector CT imaging of the abdomen and pelvis was performed using the standard protocol following bolus administration of intravenous contrast. CONTRAST:  184mL OMNIPAQUE IOHEXOL 300 MG/ML  SOLN COMPARISON:  CT abdomen pelvis dated August 25, 2017. FINDINGS: Lower chest: No acute abnormality. Chronic mild bibasilar scarring/fibrosis. Hepatobiliary: No focal liver abnormality is seen. Status post cholecystectomy. No biliary dilatation. Pancreas: Moderate atrophy. No ductal  dilatation or surrounding inflammatory changes. Spleen: Normal in size without focal abnormality. Adrenals/Urinary Tract: The adrenal glands are unremarkable. Small bilateral renal cysts. No renal calculi or hydronephrosis. Small amount of contrast in the bladder. The bladder is otherwise unremarkable. Stomach/Bowel: Stomach is within normal limits. Appendix appears normal. No evidence of bowel wall thickening, distention, or inflammatory changes. Unchanged duodenal diverticulum. Mild colonic diverticulosis. Vascular/Lymphatic: Aortic atherosclerosis. No enlarged abdominal or pelvic lymph nodes. Reproductive: Status post hysterectomy. No adnexal masses. Other: No abdominal wall hernia or abnormality. No abdominopelvic ascites. No pneumoperitoneum. Musculoskeletal: No acute or significant osseous findings. Extensive prior thoracolumbar fusion. IMPRESSION: 1.  No acute intra-abdominal process. 2.  Aortic atherosclerosis (ICD10-I70.0). Electronically Signed   By: Orville Govern.D.  On: 10/18/2019 18:47   Dg Chest Port 1 View  Result Date: 10/18/2019 CLINICAL DATA:  Weakness EXAM: PORTABLE CHEST 1 VIEW COMPARISON:  June 03, 2019 FINDINGS: The mediastinal contour and cardiac silhouette are stable. Heart size is enlarged. There is no focal infiltrate, pulmonary edema, or pleural effusion. Patient is status post prior fixation of the left clavicle and left shoulder and lumbar spine unchanged. IMPRESSION: No acute cardiopulmonary disease identified.  Stable cardiomegaly. Electronically Signed   By: Abelardo Diesel M.D.   On: 10/18/2019 07:39    Medications:   . enoxaparin (LOVENOX) injection  40 mg Subcutaneous Q24H  . [START ON 10/20/2019] influenza vaccine adjuvanted  0.5 mL Intramuscular Tomorrow-1000  . levothyroxine  100 mcg Oral Q0600  . Melatonin  3 mg Oral QHS  . sodium chloride flush  3 mL Intravenous Q12H   Continuous Infusions:   LOS: 0 days   Geradine Girt  [facility]   How to  contact the [facility] Attending or Consulting provider [place] or covering provider during after hours [place], for this patient?  1. Check the care team in [facility] and look for a) attending/consulting TRH provider listed and b) the [facility] team listed 2. Log into [email or web address] and use [place]'s universal password to access. If you do not have the password, please contact the hospital operator. 3. Locate the [facility] provider you are looking for under [facility] and page to a number that you can be directly reached. 4. If you still have difficulty reaching the provider, please page the [facility] (Director on Call) for the Hospitalists listed on amion for assistance.  10/19/2019, 9:04 AM

## 2019-10-19 NOTE — Plan of Care (Signed)
°  Problem: Coping: °Goal: Level of anxiety will decrease °Outcome: Progressing °  °

## 2019-10-20 LAB — GLUCOSE, CAPILLARY: Glucose-Capillary: 181 mg/dL — ABNORMAL HIGH (ref 70–99)

## 2019-10-20 MED ORDER — THIAMINE HCL 100 MG/ML IJ SOLN
500.0000 mg | INTRAVENOUS | Status: AC
  Administered 2019-10-20 – 2019-10-22 (×3): 500 mg via INTRAVENOUS
  Filled 2019-10-20 (×4): qty 5

## 2019-10-20 MED ORDER — VITAMIN B-12 1000 MCG PO TABS
1000.0000 ug | ORAL_TABLET | Freq: Every day | ORAL | Status: DC
  Administered 2019-10-20 – 2019-10-24 (×5): 1000 ug via ORAL
  Filled 2019-10-20 (×5): qty 1

## 2019-10-20 MED ORDER — ZOLPIDEM TARTRATE 5 MG PO TABS
5.0000 mg | ORAL_TABLET | Freq: Once | ORAL | Status: AC
  Administered 2019-10-20: 5 mg via ORAL
  Filled 2019-10-20: qty 1

## 2019-10-20 NOTE — Plan of Care (Signed)
  Problem: Pain Managment: Goal: General experience of comfort will improve Outcome: Progressing   

## 2019-10-20 NOTE — Progress Notes (Signed)
Progress Note    [name]  [id number] DOB: [birth year]  DOA: 10/17/2019 PCP: [name], MD    Brief Narrative:     Medical records reviewed and are as summarized below:  CHAUNTEE [name] is an 83 y.o. female with medical history significant of diastolic CHF, proximal atrial fibrillation, CKD, thyroid disease, and reports of dementia.  History obtained from the patient and in talks with her daughter over the phone.  Over the last month patient has had a progressive decline.  At baseline she is cared for by her daughter and previously could ambulate with use of a walker.  Over this last month she has went from needing assistance to stand, but thereafter being able to ambulate.  Yesterday, she was unable to stand with assistance or get out of bed.  She stated her legs not working.   Assessment/Plan:   Principal Problem:   Generalized weakness Active Problems:   Leukocytosis   Chronic diastolic CHF (congestive heart failure) (HCC)   Hypothyroidism   Thrombocytopenia (HCC)   Dementia (HCC)   CKD (chronic kidney disease), stage III   Falls   Hypothermia -no sign of infection -TSH normal -h/o alcohol use/poor nutritional status: ? Wernicke's disease-- will try high dose thiamine and monitor symptoms -B1 level pending  Generalized weakness: Acute on chronic.  Patient presents with progressively worsening weakness now unable to stand without assistance.  Previous MRI performed back in June did not show any acute signs of stroke/NPH -PT/OT- SNF -Social work consult for possible need of placement B12/B1- history of alcohol abuse (does not sound like is current) -polypharmacy probably contributing a lot  Leukocytosis: Acute.  WBC elevated at 11 on admission with mild tachypnea.  Patient denies any complaints besides weakness.  Chest x-ray and urinalysis did not appear significantly concerning for infection.  -resolved  Dementia: Not formally diagnosed, but patient with  increasing short-term memory loss.  -check B12 and thiamine -? Wernicke's disease- thiamine pending but will try high dose thiamine (hypothermic overnight)  Chronic diastolic CHF: Patient does not appear grossly fluid overloaded at this time. -Continue to monitor  Hypothyroidism: Last TSH noted to be 1.413 on 06/05/2019 - TSH normal -Continue levothyroxine  Chronic kidney disease stage III: Patient's creatinine appears near her baseline. -Continue to monitor  Thrombocytopenia: Chronic.  Platelet count 129. -Continue to monitor  obesity Estimated body mass index is 32.07 kg/m as calculated from the following:   Height as of this encounter: 5\' 1"  (1.549 m).   Weight as of this encounter: 77 kg.   Family Communication/Anticipated D/C date and plan/Code Status   DVT prophylaxis: Lovenox ordered. Code Status: dnr  Family Communication:  Disposition Plan: needs SNF Placement   Medical Consultants:    None.    Subjective:   Vomited yesterday but not today  Objective:    Vitals:   10/20/19 1100 10/20/19 1122 10/20/19 1200 10/20/19 1255  BP: (!) 116/53  135/60   Pulse: 62 72 72   Resp: 12 18 13    Temp:  (!) 97.3 F (36.3 C)  (!) 97.5 F (36.4 C)  TempSrc:  Oral  Oral  SpO2: 93% 90% 91%   Weight:      Height:        Intake/Output Summary (Last 24 hours) at 10/20/2019 1439 Last data filed at 10/20/2019 0530 Gross per 24 hour  Intake 120 ml  Output 1800 ml  Net -1680 ml   Filed Weights   10/18/19 1847  10/20/19 0458  Weight: 77.2 kg 77 kg    Exam: In bed  Data Reviewed:   I have personally reviewed following labs and imaging studies:  Labs: Labs show the following:   Basic Metabolic Panel: Recent Labs  Lab 10/17/19 1757 10/18/19 0448 10/19/19 0647  NA 139  --  141  K 5.7* 5.1 4.3  CL 108  --  114*  CO2 19*  --  18*  GLUCOSE 118*  --  106*  BUN 23  --  20  CREATININE 1.10*  --  1.23*  CALCIUM 9.5  --  8.6*   GFR Estimated Creatinine  Clearance: 33.7 mL/min (A) (by C-G formula based on SCr of 1.23 mg/dL (H)). Liver Function Tests: No results for input(s): AST, ALT, ALKPHOS, BILITOT, PROT, ALBUMIN in the last 168 hours. No results for input(s): LIPASE, AMYLASE in the last 168 hours. No results for input(s): AMMONIA in the last 168 hours. Coagulation profile No results for input(s): INR, PROTIME in the last 168 hours.  CBC: Recent Labs  Lab 10/17/19 1757 10/19/19 0647  WBC 11.0* 7.0  HGB 13.5 10.7*  HCT 41.4 34.2*  MCV 90.4 95.0  PLT 129* 106*   Cardiac Enzymes: No results for input(s): CKTOTAL, CKMB, CKMBINDEX, TROPONINI in the last 168 hours. BNP (last 3 results) No results for input(s): PROBNP in the last 8760 hours. CBG: Recent Labs  Lab 10/18/19 0214  GLUCAP 114*   D-Dimer: No results for input(s): DDIMER in the last 72 hours. Hgb A1c: No results for input(s): HGBA1C in the last 72 hours. Lipid Profile: No results for input(s): CHOL, HDL, LDLCALC, TRIG, CHOLHDL, LDLDIRECT in the last 72 hours. Thyroid function studies: Recent Labs    10/18/19 1100  TSH 2.658   Anemia work up: Recent Labs    10/19/19 0934  VITAMINB12 246   Sepsis Labs: Recent Labs  Lab 10/17/19 1757 10/19/19 0647  WBC 11.0* 7.0    Microbiology Recent Results (from the past 240 hour(s))  Blood culture (routine x 2)     Status: None (Preliminary result)   Collection Time: 10/17/19  9:20 PM   Specimen: BLOOD RIGHT ARM  Result Value Ref Range Status   Specimen Description BLOOD RIGHT ARM  Final   Special Requests   Final    BOTTLES DRAWN AEROBIC AND ANAEROBIC Blood Culture results may not be optimal due to an excessive volume of blood received in culture bottles   Culture   Final    NO GROWTH 3 DAYS Performed at [facility], [place] [address] [place], [place] [address]    Report Status PENDING  Incomplete  Blood culture (routine x 2)     Status: None (Preliminary result)   Collection Time: 10/17/19  9:25 PM    Specimen: BLOOD RIGHT HAND  Result Value Ref Range Status   Specimen Description BLOOD RIGHT HAND  Final   Special Requests   Final    BOTTLES DRAWN AEROBIC ONLY Blood Culture results may not be optimal due to an inadequate volume of blood received in culture bottles   Culture   Final    NO GROWTH 3 DAYS Performed at [facility], [place] [address], [place], [place] [address]    Report Status PENDING  Incomplete  SARS CORONAVIRUS 2 (TAT 6-24 HRS) Nasopharyngeal Nasopharyngeal Swab     Status: None   Collection Time: 10/18/19  8:28 AM   Specimen: Nasopharyngeal Swab  Result Value Ref Range Status  SARS Coronavirus 2 NEGATIVE NEGATIVE Final    Comment: (NOTE) SARS-CoV-2 target nucleic acids are NOT DETECTED. The SARS-CoV-2 RNA is generally detectable in upper and lower respiratory specimens during the acute phase of infection. Negative results do not preclude SARS-CoV-2 infection, do not rule out co-infections with other pathogens, and should not be used as the sole basis for treatment or other patient management decisions. Negative results must be combined with clinical observations, patient history, and epidemiological information. The expected result is Negative. Fact Sheet for Patients: SugarRoll.be Fact Sheet for Healthcare Providers: https://www.woods-mathews.com/ This test is not yet approved or cleared by the [place] FDA and  has been authorized for detection and/or diagnosis of SARS-CoV-2 by FDA under an Emergency Use Authorization (EUA). This EUA will remain  in effect (meaning this test can be used) for the duration of the COVID-19 declaration under Section 56 4(b)(1) of the Act, 21 U.S.C. section 360bbb-3(b)(1), unless the authorization is terminated or revoked sooner. Performed at [facility], [place] [address], [place], [place] [address]     Procedures and diagnostic studies:  Ct Abdomen Pelvis W Contrast   Result Date: 10/18/2019 CLINICAL DATA:  Acute abdominal pain.  Urinary tract infection. EXAM: CT ABDOMEN AND PELVIS WITH CONTRAST TECHNIQUE: Multidetector CT imaging of the abdomen and pelvis was performed using the standard protocol following bolus administration of intravenous contrast. CONTRAST:  116mL OMNIPAQUE IOHEXOL 300 MG/ML  SOLN COMPARISON:  CT abdomen pelvis dated August 25, 2017. FINDINGS: Lower chest: No acute abnormality. Chronic mild bibasilar scarring/fibrosis. Hepatobiliary: No focal liver abnormality is seen. Status post cholecystectomy. No biliary dilatation. Pancreas: Moderate atrophy. No ductal dilatation or surrounding inflammatory changes. Spleen: Normal in size without focal abnormality. Adrenals/Urinary Tract: The adrenal glands are unremarkable. Small bilateral renal cysts. No renal calculi or hydronephrosis. Small amount of contrast in the bladder. The bladder is otherwise unremarkable. Stomach/Bowel: Stomach is within normal limits. Appendix appears normal. No evidence of bowel wall thickening, distention, or inflammatory changes. Unchanged duodenal diverticulum. Mild colonic diverticulosis. Vascular/Lymphatic: Aortic atherosclerosis. No enlarged abdominal or pelvic lymph nodes. Reproductive: Status post hysterectomy. No adnexal masses. Other: No abdominal wall hernia or abnormality. No abdominopelvic ascites. No pneumoperitoneum. Musculoskeletal: No acute or significant osseous findings. Extensive prior thoracolumbar fusion. IMPRESSION: 1.  No acute intra-abdominal process. 2.  Aortic atherosclerosis (ICD10-I70.0). Electronically Signed   By: Titus Dubin M.D.   On: 10/18/2019 18:47    Medications:   . aspirin EC  81 mg Oral q morning - 10a  . chlordiazePOXIDE  5 mg Oral q morning - 10a  . enoxaparin (LOVENOX) injection  40 mg Subcutaneous Q24H  . FLUoxetine  20 mg Oral Daily  . folic acid  1 mg Oral Daily  . furosemide  20 mg Oral Daily  . hydrALAZINE  100 mg Oral  BID  . influenza vaccine adjuvanted  0.5 mL Intramuscular Tomorrow-1000  . levothyroxine  100 mcg Oral Q0600  . Melatonin  3 mg Oral QHS  . metoprolol succinate  75 mg Oral Daily  . QUEtiapine  200 mg Oral QHS  . simvastatin  20 mg Oral QHS  . sodium chloride flush  3 mL Intravenous Q12H  . venlafaxine XR  37.5 mg Oral Q breakfast  . vitamin B-12  1,000 mcg Oral Daily   Continuous Infusions: . thiamine injection       LOS: 1 day   Geradine Girt  [facility]   How to contact the [facility] Attending or Consulting  provider [place] or covering provider during after hours [place], for this patient?  1. Check the care team in [facility] and look for a) attending/consulting TRH provider listed and b) the [facility] team listed 2. Log into [email or web address] and use [place]'s universal password to access. If you do not have the password, please contact the hospital operator. 3. Locate the [facility] provider you are looking for under [facility] and page to a number that you can be directly reached. 4. If you still have difficulty reaching the provider, please page the [facility] (Director on Call) for the Hospitalists listed on amion for assistance.  10/20/2019, 2:39 PM

## 2019-10-20 NOTE — Plan of Care (Signed)
Poc progressing.  

## 2019-10-20 NOTE — Progress Notes (Signed)
CSW met with patient bedside to discuss the SNF recommendation and patient notified CSW that she wanted to go to our in person rehab since she has been before. CSW informed patient that the OT/PT usually assess for that level of care as well. Patient asked CSW could she follow up with OT/PT. CSW reached out to the weekend OT/PT and discussed request of patient. CSW was informed that an attempt to reassess may happen tomorrow or on Monday.  CSW will follow up with patient tomorrow to see if she is willing to start the SNF process as a back up.

## 2019-10-20 NOTE — Progress Notes (Signed)
Unable to get patient's temperature. Attempted orally and axillary multiple times. Rectal temp 95.4. Patient alert and oriented,states " I feel cold",5 warm blankets applied. Per patient, " It happened the last time I was in the hospital,they can't get my temperature." [place],NP made aware,order received. [name] [name], Wonda Cheng, Therapist, sports

## 2019-10-21 LAB — CBC
HCT: 31.8 % — ABNORMAL LOW (ref 36.0–46.0)
Hemoglobin: 10 g/dL — ABNORMAL LOW (ref 12.0–15.0)
MCH: 29.7 pg (ref 26.0–34.0)
MCHC: 31.4 g/dL (ref 30.0–36.0)
MCV: 94.4 fL (ref 80.0–100.0)
Platelets: 107 10*3/uL — ABNORMAL LOW (ref 150–400)
RBC: 3.37 MIL/uL — ABNORMAL LOW (ref 3.87–5.11)
RDW: 15.5 % (ref 11.5–15.5)
WBC: 7 10*3/uL (ref 4.0–10.5)
nRBC: 0 % (ref 0.0–0.2)

## 2019-10-21 LAB — COMPREHENSIVE METABOLIC PANEL
ALT: 15 U/L (ref 0–44)
AST: 13 U/L — ABNORMAL LOW (ref 15–41)
Albumin: 2.5 g/dL — ABNORMAL LOW (ref 3.5–5.0)
Alkaline Phosphatase: 85 U/L (ref 38–126)
Anion gap: 9 (ref 5–15)
BUN: 32 mg/dL — ABNORMAL HIGH (ref 8–23)
CO2: 21 mmol/L — ABNORMAL LOW (ref 22–32)
Calcium: 8.5 mg/dL — ABNORMAL LOW (ref 8.9–10.3)
Chloride: 110 mmol/L (ref 98–111)
Creatinine, Ser: 1.56 mg/dL — ABNORMAL HIGH (ref 0.44–1.00)
GFR calc Af Amer: 36 mL/min — ABNORMAL LOW (ref >60)
GFR calc non Af Amer: 31 mL/min — ABNORMAL LOW (ref >60)
Glucose, Bld: 102 mg/dL — ABNORMAL HIGH (ref 70–99)
Potassium: 4.1 mmol/L (ref 3.5–5.1)
Sodium: 140 mmol/L (ref 135–145)
Total Bilirubin: 0.7 mg/dL (ref 0.3–1.2)
Total Protein: 5.5 g/dL — ABNORMAL LOW (ref 6.5–8.1)

## 2019-10-21 MED ORDER — ENOXAPARIN SODIUM 30 MG/0.3ML ~~LOC~~ SOLN
30.0000 mg | SUBCUTANEOUS | Status: DC
  Administered 2019-10-22 – 2019-10-24 (×3): 30 mg via SUBCUTANEOUS
  Filled 2019-10-21 (×3): qty 0.3

## 2019-10-21 MED ORDER — ZOLPIDEM TARTRATE 5 MG PO TABS
5.0000 mg | ORAL_TABLET | Freq: Every evening | ORAL | Status: DC | PRN
  Administered 2019-10-21 – 2019-10-23 (×3): 5 mg via ORAL
  Filled 2019-10-21 (×3): qty 1

## 2019-10-21 NOTE — Progress Notes (Signed)
PROGRESS NOTE  [name] [name] [id number] DOB: 1936/09/29 DOA: 10/17/2019 PCP: [name], MD  HPI/Recap of past 24 hours: [name] [name] is an 83 year old female with medical history significant for diastolic congestive heart failure proximal paroxysmal atrial fibrillation chronic kidney disease thyroid disease and dementia who was admitted for progressive functioning decline with weakness over the last month she was stated to have gone from needing assistance to stand to not able to stand with assistance or get out of bed she stated that her legs were not working. Subjective patient seen and examined at bedside she is complaining that the Pleurx catheter is irritating.  Nurse advised to remove it temporarily and rarely applied given her little break  Assessment/Plan: Principal Problem:   Generalized weakness Active Problems:   Leukocytosis   Chronic diastolic CHF (congestive heart failure) (HCC)   Hypothyroidism   Thrombocytopenia (HCC)   Dementia (HCC)   CKD (chronic kidney disease), stage III   Falls  1.  Generalized weakness acute on chronic.  Continue PT OT patient is for SNF  2.  Leukocytosis resolved  3.  Dementia we will continue thiamine Chronic kidney disease stage III her creatinine is at baseline we will continue to monitor  4.  Chronic diastolic CHF she is not fluid overloaded at this time we will continue to monitor  5.  Obesity.  Will encouraged on low-calorie diet  Hypothyroidism TSH is normal continue levothyroxine  Code Status: DNR    Severity of Illness: The appropriate patient status for this patient is INPATIENT. Inpatient status is judged to be reasonable and necessary in order to provide the required intensity of service to ensure the patient's safety. The patient's presenting symptoms, physical exam findings, and initial radiographic and laboratory data in the context of their chronic comorbidities is felt to place them at high risk for further  clinical deterioration. Furthermore, it is not anticipated that the patient will be medically stable for discharge from the hospital within 2 midnights of admission. The following factors support the patient status of inpatient.   Patient is waiting for nursing home placement  * I certify that at the point of admission it is my clinical judgment that the patient will require inpatient hospital care spanning beyond 2 midnights from the point of admission due to high intensity of service, high risk for further deterioration and high frequency of surveillance required.*    Family Communication: None  Disposition Plan: SNF   Consultants:  None  Procedures:  None  Antimicrobials:  None  DVT prophylaxis: Lovenox   Objective: Vitals:   10/20/19 2100 10/21/19 0026 10/21/19 0400 10/21/19 0700  BP: (!) 149/53 (!) 128/55 114/72 (!) 138/52  Pulse: 93 87 76 73  Resp: 16 20 18 14   Temp:  98.6 F (37 C) 97.6 F (36.4 C) 98.5 F (36.9 C)  TempSrc:  Oral Oral Oral  SpO2: 90% 95% 97% 97%  Weight:      Height:        Intake/Output Summary (Last 24 hours) at 10/21/2019 1141 Last data filed at 10/21/2019 0400 Gross per 24 hour  Intake 200 ml  Output 700 ml  Net -500 ml   Filed Weights   10/18/19 1847 10/20/19 0458  Weight: 77.2 kg 77 kg   Body mass index is 32.07 kg/m.  Exam:   General: 83 y.o. year-old female well developed well nourished in no acute distress.  Alert and oriented x3.  Overweight  Cardiovascular: Regular rate and rhythm with no  rubs or gallops.  No thyromegaly or JVD noted.    Respiratory: Clear to auscultation with no wheezes or rales. Good inspiratory effort.  Abdomen: Soft nontender nondistended with normal bowel sounds x4 quadrants.  Musculoskeletal: No lower extremity edema. 2/4 pulses in all 4 extremities.  Skin: No ulcerative lesions noted or rashes,  Psychiatry: Mood is appropriate for condition and setting    Data Reviewed: CBC: Recent  Labs  Lab 10/17/19 1757 10/19/19 0647 10/21/19 0242  WBC 11.0* 7.0 7.0  HGB 13.5 10.7* 10.0*  HCT 41.4 34.2* 31.8*  MCV 90.4 95.0 94.4  PLT 129* 106* [id number]*   Basic Metabolic Panel: Recent Labs  Lab 10/17/19 1757 10/18/19 0448 10/19/19 0647 10/21/19 0242  NA 139  --  141 140  K 5.7* 5.1 4.3 4.1  CL 108  --  114* 110  CO2 19*  --  18* 21*  GLUCOSE 118*  --  106* 102*  BUN 23  --  20 32*  CREATININE 1.10*  --  1.23* 1.56*  CALCIUM 9.5  --  8.6* 8.5*   GFR: Estimated Creatinine Clearance: 26.6 mL/min (A) (by C-G formula based on SCr of 1.56 mg/dL (H)). Liver Function Tests: Recent Labs  Lab 10/21/19 0242  AST 13*  ALT 15  ALKPHOS 85  BILITOT 0.7  PROT 5.5*  ALBUMIN 2.5*   No results for input(s): LIPASE, AMYLASE in the last 168 hours. No results for input(s): AMMONIA in the last 168 hours. Coagulation Profile: No results for input(s): INR, PROTIME in the last 168 hours. Cardiac Enzymes: No results for input(s): CKTOTAL, CKMB, CKMBINDEX, TROPONINI in the last 168 hours. BNP (last 3 results) No results for input(s): PROBNP in the last 8760 hours. HbA1C: No results for input(s): HGBA1C in the last 72 hours. CBG: Recent Labs  Lab 10/18/19 0214 10/20/19 1750  GLUCAP 114* 181*   Lipid Profile: No results for input(s): CHOL, HDL, LDLCALC, TRIG, CHOLHDL, LDLDIRECT in the last 72 hours. Thyroid Function Tests: No results for input(s): TSH, T4TOTAL, FREET4, T3FREE, THYROIDAB in the last 72 hours. Anemia Panel: Recent Labs    10/19/19 0934  VITAMINB12 246   Urine analysis:    Component Value Date/Time   COLORURINE YELLOW 10/17/2019 2135   APPEARANCEUR HAZY (A) 10/17/2019 2135   LABSPEC 1.009 10/17/2019 2135   PHURINE 5.0 10/17/2019 2135   GLUCOSEU NEGATIVE 10/17/2019 2135   HGBUR NEGATIVE 10/17/2019 2135   [place] NEGATIVE 10/17/2019 2135   KETONESUR NEGATIVE 10/17/2019 2135   PROTEINUR >=300 (A) 10/17/2019 2135   UROBILINOGEN 0.2 09/04/2014 1340    NITRITE NEGATIVE 10/17/2019 2135   LEUKOCYTESUR NEGATIVE 10/17/2019 2135   Sepsis Labs: @LABRCNTIP (procalcitonin:4,lacticidven:4)  ) Recent Results (from the past 240 hour(s))  Blood culture (routine x 2)     Status: None (Preliminary result)   Collection Time: 10/17/19  9:20 PM   Specimen: BLOOD RIGHT ARM  Result Value Ref Range Status   Specimen Description BLOOD RIGHT ARM  Final   Special Requests   Final    BOTTLES DRAWN AEROBIC AND ANAEROBIC Blood Culture results may not be optimal due to an excessive volume of blood received in culture bottles   Culture   Final    NO GROWTH 4 DAYS Performed at [facility], [place] [address], [place], [place] [address]    Report Status PENDING  Incomplete  Blood culture (routine x 2)     Status: None (Preliminary result)   Collection Time: 10/17/19  9:25 PM  Specimen: BLOOD RIGHT HAND  Result Value Ref Range Status   Specimen Description BLOOD RIGHT HAND  Final   Special Requests   Final    BOTTLES DRAWN AEROBIC ONLY Blood Culture results may not be optimal due to an inadequate volume of blood received in culture bottles   Culture   Final    NO GROWTH 4 DAYS Performed at [facility], [place] [address], [place], [place] [address]    Report Status PENDING  Incomplete  SARS CORONAVIRUS 2 (TAT 6-24 HRS) Nasopharyngeal Nasopharyngeal Swab     Status: None   Collection Time: 10/18/19  8:28 AM   Specimen: Nasopharyngeal Swab  Result Value Ref Range Status   SARS Coronavirus 2 NEGATIVE NEGATIVE Final    Comment: (NOTE) SARS-CoV-2 target nucleic acids are NOT DETECTED. The SARS-CoV-2 RNA is generally detectable in upper and lower respiratory specimens during the acute phase of infection. Negative results do not preclude SARS-CoV-2 infection, do not rule out co-infections with other pathogens, and should not be used as the sole basis for treatment or other patient management decisions. Negative results must be combined with clinical  observations, patient history, and epidemiological information. The expected result is Negative. Fact Sheet for Patients: SugarRoll.be Fact Sheet for Healthcare Providers: https://www.woods-mathews.com/ This test is not yet approved or cleared by the [place] FDA and  has been authorized for detection and/or diagnosis of SARS-CoV-2 by FDA under an Emergency Use Authorization (EUA). This EUA will remain  in effect (meaning this test can be used) for the duration of the COVID-19 declaration under Section 56 4(b)(1) of the Act, 21 U.S.C. section 360bbb-3(b)(1), unless the authorization is terminated or revoked sooner. Performed at [facility], [place] [address], [place], [place] [address]       Studies: No results found.  Scheduled Meds:  aspirin EC  81 mg Oral q morning - 10a   chlordiazePOXIDE  5 mg Oral q morning - 10a   enoxaparin (LOVENOX) injection  40 mg Subcutaneous Q24H   FLUoxetine  20 mg Oral Daily   folic acid  1 mg Oral Daily   furosemide  20 mg Oral Daily   hydrALAZINE  100 mg Oral BID   influenza vaccine adjuvanted  0.5 mL Intramuscular Tomorrow-1000   levothyroxine  100 mcg Oral Q0600   Melatonin  3 mg Oral QHS   metoprolol succinate  75 mg Oral Daily   QUEtiapine  200 mg Oral QHS   simvastatin  20 mg Oral QHS   sodium chloride flush  3 mL Intravenous Q12H   venlafaxine XR  37.5 mg Oral Q breakfast   vitamin B-12  1,000 mcg Oral Daily    Continuous Infusions:  thiamine injection 500 mg (10/20/19 1831)     LOS: 2 days     Cristal Deer, MD [facility]  To reach me or the doctor on call, go to: [email or web address] Password [facility]  10/21/2019, 11:41 AM

## 2019-10-21 NOTE — Plan of Care (Signed)
Poc progressing.  

## 2019-10-22 LAB — CULTURE, BLOOD (ROUTINE X 2)
Culture: NO GROWTH
Culture: NO GROWTH

## 2019-10-22 LAB — VITAMIN B1: Vitamin B1 (Thiamine): 174 nmol/L (ref 66.5–200.0)

## 2019-10-22 NOTE — Progress Notes (Signed)
PROGRESS NOTE  [name] [name] [id number] DOB: [birth year] DOA: 10/17/2019 PCP: [name], MD  HPI/Recap of past 24 hours: [name] [name] is an 83 year old female with medical history significant for diastolic congestive heart failure proximal paroxysmal atrial fibrillation chronic kidney disease thyroid disease and dementia who was admitted for progressive functioning decline with weakness over the last month she was stated to have gone from needing assistance to stand to not able to stand with assistance or get out of bed she stated that her legs were not working.  Subjective patient seen and examined at bedside she is complaining that the Pleurx catheter is irritating.  Nurse advised to remove it temporarily and rarely applied given her little break  October 22, 2019 subjective: Patient seen and examined at bedside she feels much better she had complained of some irritation due to Pleurx catheter she was given a break the area does not look less irritated.  She is for nursing home placement  Assessment/Plan: Principal Problem:   Generalized weakness Active Problems:   Leukocytosis   Chronic diastolic CHF (congestive heart failure) (HCC)   Hypothyroidism   Thrombocytopenia (HCC)   Dementia (HCC)   CKD (chronic kidney disease), stage III   Falls  1.  Generalized weakness acute on chronic.  Continue PT OT patient is for SNF  2.  Leukocytosis resolved  3.  Dementia we will continue thiamine Chronic kidney disease stage III her creatinine is at baseline we will continue to monitor  4.  Chronic diastolic CHF she is not fluid overloaded at this time we will continue to monitor  5.  Obesity.  Will encouraged on low-calorie diet  Hypothyroidism TSH is normal continue levothyroxine  Code Status: DNR    Severity of Illness: The appropriate patient status for this patient is INPATIENT. Inpatient status is judged to be reasonable and necessary in order to provide the required  intensity of service to ensure the patient's safety. The patient's presenting symptoms, physical exam findings, and initial radiographic and laboratory data in the context of their chronic comorbidities is felt to place them at high risk for further clinical deterioration. Furthermore, it is not anticipated that the patient will be medically stable for discharge from the hospital within 2 midnights of admission. The following factors support the patient status of inpatient.   Patient is waiting for nursing home placement  * I certify that at the point of admission it is my clinical judgment that the patient will require inpatient hospital care spanning beyond 2 midnights from the point of admission due to high intensity of service, high risk for further deterioration and high frequency of surveillance required.*    Family Communication: None  Disposition Plan: SNF   Consultants:  None  Procedures:  None  Antimicrobials:  None  DVT prophylaxis: Lovenox   Objective: Vitals:   10/22/19 0908 10/22/19 0910 10/22/19 1115 10/22/19 1448  BP: (!) 118/55  (!) 102/40 (!) 144/56  Pulse:  75 71 72  Resp:   18 17  Temp: 98.8 F (37.1 C)  97.9 F (36.6 C) 98 F (36.7 C)  TempSrc: Oral  Oral Axillary  SpO2: 96%  94% 96%  Weight:      Height:        Intake/Output Summary (Last 24 hours) at 10/22/2019 1652 Last data filed at 10/22/2019 1449 Gross per 24 hour  Intake 750 ml  Output 950 ml  Net -200 ml   Filed Weights   10/18/19 1847 10/20/19 0458  Weight: 77.2 kg 77 kg   Body mass index is 32.07 kg/m.  Exam:  . General: 83 y.o. year-old female well developed well nourished in no acute distress.  Alert and oriented x3.  Overweight . Cardiovascular: Regular rate and rhythm with no rubs or gallops.  No thyromegaly or JVD noted.   [name] Respiratory: Clear to auscultation with no wheezes or rales. Good inspiratory effort. . Abdomen: Soft nontender nondistended with normal bowel  sounds x4 quadrants. . Musculoskeletal: No lower extremity edema. 2/4 pulses in all 4 extremities. . Skin: No ulcerative lesions noted or rashes, . Psychiatry: Mood is appropriate for condition and setting    Data Reviewed: CBC: Recent Labs  Lab 10/17/19 1757 10/19/19 0647 10/21/19 0242  WBC 11.0* 7.0 7.0  HGB 13.5 10.7* 10.0*  HCT 41.4 34.2* 31.8*  MCV 90.4 95.0 94.4  PLT 129* 106* [id number]*   Basic Metabolic Panel: Recent Labs  Lab 10/17/19 1757 10/18/19 0448 10/19/19 0647 10/21/19 0242  NA 139  --  141 140  K 5.7* 5.1 4.3 4.1  CL 108  --  114* 110  CO2 19*  --  18* 21*  GLUCOSE 118*  --  106* 102*  BUN 23  --  20 32*  CREATININE 1.10*  --  1.23* 1.56*  CALCIUM 9.5  --  8.6* 8.5*   GFR: Estimated Creatinine Clearance: 26.6 mL/min (A) (by C-G formula based on SCr of 1.56 mg/dL (H)). Liver Function Tests: Recent Labs  Lab 10/21/19 0242  AST 13*  ALT 15  ALKPHOS 85  BILITOT 0.7  PROT 5.5*  ALBUMIN 2.5*   No results for input(s): LIPASE, AMYLASE in the last 168 hours. No results for input(s): AMMONIA in the last 168 hours. Coagulation Profile: No results for input(s): INR, PROTIME in the last 168 hours. Cardiac Enzymes: No results for input(s): CKTOTAL, CKMB, CKMBINDEX, TROPONINI in the last 168 hours. BNP (last 3 results) No results for input(s): PROBNP in the last 8760 hours. HbA1C: No results for input(s): HGBA1C in the last 72 hours. CBG: Recent Labs  Lab 10/18/19 0214 10/20/19 1750  GLUCAP 114* 181*   Lipid Profile: No results for input(s): CHOL, HDL, LDLCALC, TRIG, CHOLHDL, LDLDIRECT in the last 72 hours. Thyroid Function Tests: No results for input(s): TSH, T4TOTAL, FREET4, T3FREE, THYROIDAB in the last 72 hours. Anemia Panel: No results for input(s): VITAMINB12, FOLATE, FERRITIN, TIBC, IRON, RETICCTPCT in the last 72 hours. Urine analysis:    Component Value Date/Time   COLORURINE YELLOW 10/17/2019 2135   APPEARANCEUR HAZY (A) 10/17/2019  2135   LABSPEC 1.009 10/17/2019 2135   PHURINE 5.0 10/17/2019 2135   GLUCOSEU NEGATIVE 10/17/2019 2135   HGBUR NEGATIVE 10/17/2019 2135   [place] NEGATIVE 10/17/2019 2135   KETONESUR NEGATIVE 10/17/2019 2135   PROTEINUR >=300 (A) 10/17/2019 2135   UROBILINOGEN 0.2 09/04/2014 1340   NITRITE NEGATIVE 10/17/2019 2135   LEUKOCYTESUR NEGATIVE 10/17/2019 2135   Sepsis Labs: @LABRCNTIP (procalcitonin:4,lacticidven:4)  ) Recent Results (from the past 240 hour(s))  Blood culture (routine x 2)     Status: None   Collection Time: 10/17/19  9:20 PM   Specimen: BLOOD RIGHT ARM  Result Value Ref Range Status   Specimen Description BLOOD RIGHT ARM  Final   Special Requests   Final    BOTTLES DRAWN AEROBIC AND ANAEROBIC Blood Culture results may not be optimal due to an excessive volume of blood received in culture bottles   Culture   Final    NO GROWTH  5 DAYS Performed at [facility], [place] [address] [place], [place] [address]    Report Status 10/22/2019 FINAL  Final  Blood culture (routine x 2)     Status: None   Collection Time: 10/17/19  9:25 PM   Specimen: BLOOD RIGHT HAND  Result Value Ref Range Status   Specimen Description BLOOD RIGHT HAND  Final   Special Requests   Final    BOTTLES DRAWN AEROBIC ONLY Blood Culture results may not be optimal due to an inadequate volume of blood received in culture bottles   Culture   Final    NO GROWTH 5 DAYS Performed at [facility], [place] [address], [place], [place] [address]    Report Status 10/22/2019 FINAL  Final  SARS CORONAVIRUS 2 (TAT 6-24 HRS) Nasopharyngeal Nasopharyngeal Swab     Status: None   Collection Time: 10/18/19  8:28 AM   Specimen: Nasopharyngeal Swab  Result Value Ref Range Status   SARS Coronavirus 2 NEGATIVE NEGATIVE Final    Comment: (NOTE) SARS-CoV-2 target nucleic acids are NOT DETECTED. The SARS-CoV-2 RNA is generally detectable in upper and lower respiratory specimens during the acute phase of  infection. Negative results do not preclude SARS-CoV-2 infection, do not rule out co-infections with other pathogens, and should not be used as the sole basis for treatment or other patient management decisions. Negative results must be combined with clinical observations, patient history, and epidemiological information. The expected result is Negative. Fact Sheet for Patients: SugarRoll.be Fact Sheet for Healthcare Providers: https://www.woods-mathews.com/ This test is not yet approved or cleared by the [place] FDA and  has been authorized for detection and/or diagnosis of SARS-CoV-2 by FDA under an Emergency Use Authorization (EUA). This EUA will remain  in effect (meaning this test can be used) for the duration of the COVID-19 declaration under Section 56 4(b)(1) of the Act, 21 U.S.C. section 360bbb-3(b)(1), unless the authorization is terminated or revoked sooner. Performed at [facility], [place] [address], [place],  [address]       Studies: No results found.  Scheduled Meds: . aspirin EC  81 mg Oral q morning - 10a  . chlordiazePOXIDE  5 mg Oral q morning - 10a  . enoxaparin (LOVENOX) injection  30 mg Subcutaneous Q24H  . FLUoxetine  20 mg Oral Daily  . folic acid  1 mg Oral Daily  . furosemide  20 mg Oral Daily  . hydrALAZINE  100 mg Oral BID  . influenza vaccine adjuvanted  0.5 mL Intramuscular Tomorrow-1000  . levothyroxine  100 mcg Oral Q0600  . Melatonin  3 mg Oral QHS  . metoprolol succinate  75 mg Oral Daily  . QUEtiapine  200 mg Oral QHS  . simvastatin  20 mg Oral QHS  . sodium chloride flush  3 mL Intravenous Q12H  . venlafaxine XR  37.5 mg Oral Q breakfast  . vitamin B-12  1,000 mcg Oral Daily    Continuous Infusions: . thiamine injection 500 mg (10/22/19 1633)     LOS: 3 days     Cristal Deer, MD [facility]  To reach me or the doctor on call, go to: [email or web address] Password  [facility]  10/22/2019, 4:52 PM

## 2019-10-22 NOTE — Progress Notes (Signed)
Received from 4E, alert and oriented , not in any distress. Oriented to unit and staff, call light given. Will monitor

## 2019-10-22 NOTE — TOC Progression Note (Addendum)
Transition of Care [facility]) - Progression Note    Patient Details  Name: [name] [name] MRN: [id number] Date of Birth: [birth year]  Transition of Care [facility]) CM/SW [place], [place] Phone Number: 10/22/2019, 1:06 PM  Clinical Narrative:     CSW met with patient at bedside. CSW introduced self and explain role. CSW discuss PT recommendation of ST rehab at [facility]. Patient states she preferred CIR but was agreeable to ST rehab at [facility].  Patient reports her daughter lives in home with her but she is "having some problems herself". Patient recognize the need to go to [facility] at [facility].Patient states she was not familiar with SNFs and had no preference. Patient states no questions or concerns at this time.  Patient gave CSW permission to contact her daughter, [name]. CSW updated patient's daughter and explained the SNF process. She requested referral to be sent to [facility] and other SNFs in the area. CSW verbally gave patient's daughter the  Medicare.gov website. Patient was provided with Medicare.gov listing and copy placed in shadow chart.   Thurmond Butts, MSW, [facility] Clinical Social Worker [phone]   Expected Discharge Plan: Skilled Nursing Facility Barriers to Discharge: SNF Pending bed offer, Insurance Authorization  Expected Discharge Plan and Services Expected Discharge Plan: [place] In-house Referral: Clinical Social Work     Living arrangements for the past 2 months: Single Family Home                                       Social Determinants of Health (SDOH) Interventions    Readmission Risk Interventions Readmission Risk Prevention Plan 03/12/2019  Transportation Screening Complete  PCP or Specialist Appt within 3-5 Days Complete  HRI or [place] Complete  Social Work Consult for [place] Planning/Counseling Complete  Palliative Care Screening Not Applicable  Medication Review Press photographer) Complete  Some  recent data might be hidden

## 2019-10-22 NOTE — TOC Progression Note (Signed)
Transition of Care [facility]) - Progression Note    Patient Details  Name: TAHIRIH DELBUONO MRN: [id number] Date of Birth: Apr 06, 1936  Transition of Care [facility]) CM/SW [place], [place] Phone Number: 10/22/2019, 1:37 PM  Clinical Narrative:     Called patient's daughter, left voice message of bed offers including [facility]   Thurmond Butts, MSW, [facility] Clinical Social Worker [phone]   Expected Discharge Plan: Skilled Nursing Facility Barriers to Discharge: SNF Pending bed offer, Insurance Authorization  Expected Discharge Plan and Services Expected Discharge Plan: [place] In-house Referral: Clinical Social Work     Living arrangements for the past 2 months: Single Family Home                                       Social Determinants of Health (SDOH) Interventions    Readmission Risk Interventions Readmission Risk Prevention Plan 03/12/2019  Transportation Screening Complete  PCP or Specialist Appt within 3-5 Days Complete  HRI or [place] Complete  Social Work Consult for [place] Planning/Counseling Complete  Palliative Care Screening Not Applicable  Medication Review Press photographer) Complete  Some recent data might be hidden

## 2019-10-22 NOTE — Progress Notes (Signed)
Physical Therapy Treatment Patient Details Name: [name] [name] MRN: [id number] DOB: Oct 16, 1936 Today's Date: 10/22/2019    History of Present Illness 83 year old with dementia here with generalized weakness possible UTI.  Exam significant for signs of dehydration.  Patient has mild hyperkalemia and is received some IV fluids.  PMH positive for CVA, CAD, PAF, HTN, CHF, anxiety/depression.    PT Comments    Pt was seen for follow up visit with a progression of exercises without O2 on, and noted with even a minor effort had O2 sats down to 88% and pulse up to 139.  Was better able to tolerate without fluctuations in HR and O2 sats, and could help with scooting up in bed with gravity assist.  Pt is fatigued today, and with HR up needed to return to bed with O2 replaced to finish ex's.  Follow up with gait as pt can tolerate.   Follow Up Recommendations  SNF     Equipment Recommendations  None recommended by PT    Recommendations for Other Services       Precautions / Restrictions Precautions Precautions: Fall Precaution Comments: monitor O2 sats and pulses Restrictions Weight Bearing Restrictions: No    Mobility  Bed Mobility Overal bed mobility: Needs Assistance Bed Mobility: Supine to Sit;Sit to Supine     Supine to sit: Min assist Sit to supine: Min assist   General bed mobility comments: min assist to support trunk and with support to legs to get back up   Transfers Overall transfer level: Needs assistance               General transfer comment: did not perform due to elevation of HR  Ambulation/Gait                 Stairs             Wheelchair Mobility    Modified Rankin (Stroke Patients Only)       Balance     Sitting balance-Leahy Scale: Fair                                      Cognition Arousal/Alertness: Awake/alert Behavior During Therapy: WFL for tasks assessed/performed Overall Cognitive Status: History of  cognitive impairments - at baseline                                        Exercises General Exercises - Lower Extremity Ankle Circles/Pumps: AROM;5 reps Long Arc Quad: Strengthening;10 reps Heel Slides: Strengthening;10 reps Hip ABduction/ADduction: Strengthening;10 reps Hip Flexion/Marching: AROM;10 reps    General Comments General comments (skin integrity, edema, etc.): pt was able to do ther ex and sit on side of bed but noted her HR was up to 139 before PT had to stop and resume use of O2      Pertinent Vitals/Pain Pain Assessment: No/denies pain    Home Living                      Prior Function            PT Goals (current goals can now be found in the care plan section) Acute Rehab PT Goals Patient Stated Goal: none stated Progress towards PT goals: Progressing toward goals    Frequency    Min 3X/week  PT Plan Current plan remains appropriate    Co-evaluation              AM-PAC PT "6 Clicks" Mobility   Outcome Measure  Help needed turning from your back to your side while in a flat bed without using bedrails?: A Lot Help needed moving from lying on your back to sitting on the side of a flat bed without using bedrails?: A Lot Help needed moving to and from a bed to a chair (including a wheelchair)?: A Lot Help needed standing up from a chair using your arms (e.g., wheelchair or bedside chair)?: A Lot Help needed to walk in hospital room?: A Lot Help needed climbing 3-5 steps with a railing? : A Lot 6 Click Score: 12    End of Session Equipment Utilized During Treatment: Oxygen Activity Tolerance: Patient limited by fatigue;Treatment limited secondary to medical complications (Comment) Patient left: with call bell/phone within reach;in bed Nurse Communication: Mobility status PT Visit Diagnosis: Other abnormalities of gait and mobility (R26.89);Muscle weakness (generalized) (M62.81)     Time: [id number] PT Time  Calculation (min) (ACUTE ONLY): 29 min  Charges:  $Therapeutic Exercise: 8-22 mins $Therapeutic Activity: 8-22 mins                 Ramond Dial 10/22/2019, 7:19 PM   Mee Hives, PT MS Acute Rehab Dept. Number: [place] and [place]

## 2019-10-22 NOTE — Care Management Important Message (Signed)
Important Message  Patient Details  Name: [name] [name] MRN: [id number] Date of Birth: 1935/12/16   Medicare Important Message Given:  Yes     Shelda Altes 10/22/2019, 12:42 PM

## 2019-10-23 MED ORDER — PNEUMOCOCCAL VAC POLYVALENT 25 MCG/0.5ML IJ INJ
0.5000 mL | INJECTION | INTRAMUSCULAR | Status: AC
  Administered 2019-10-23: 0.5 mL via INTRAMUSCULAR
  Filled 2019-10-23: qty 0.5

## 2019-10-23 NOTE — Progress Notes (Signed)
PROGRESS NOTE  [name] [name] [id number] DOB: [birth year] DOA: 10/17/2019 PCP: [name], MD  HPI/Recap of past 24 hours: [name] [name] is an 83 year old female with medical history significant for diastolic congestive heart failure proximal paroxysmal atrial fibrillation chronic kidney disease thyroid disease and dementia who was admitted for progressive functioning decline with weakness over the last month she was stated to have gone from needing assistance to stand to not able to stand with assistance or get out of bed she stated that her legs were not working.  Subjective patient seen and examined at bedside she is complaining that the Pleurx catheter is irritating.  Nurse advised to remove it temporarily and rarely applied given her little break  October 22, 2019 subjective: Patient seen and examined at bedside she feels much better she had complained of some irritation due to Pleurx catheter she was given a break the area does not look less irritated.  She is for nursing home placement  October 23, 2019 subjective: Patient seen and examined at bedside she is doing well today denies any new complaints still awaiting nursing home placement  Assessment/Plan: Principal Problem:   Generalized weakness Active Problems:   Leukocytosis   Chronic diastolic CHF (congestive heart failure) (HCC)   Hypothyroidism   Thrombocytopenia (HCC)   Dementia (HCC)   CKD (chronic kidney disease), stage III   Falls  1.  Generalized weakness acute on chronic.  Continue PT OT patient is for SNF  2.  Leukocytosis resolved  3.  Dementia we will continue thiamine Chronic kidney disease stage III her creatinine is at baseline we will continue to monitor  4.  Chronic diastolic CHF she is not fluid overloaded at this time we will continue to monitor  5.  Obesity.  Will encouraged on low-calorie diet  Hypothyroidism TSH is normal continue levothyroxine  Code Status: DNR    Severity of  Illness: The appropriate patient status for this patient is INPATIENT. Inpatient status is judged to be reasonable and necessary in order to provide the required intensity of service to ensure the patient's safety. The patient's presenting symptoms, physical exam findings, and initial radiographic and laboratory data in the context of their chronic comorbidities is felt to place them at high risk for further clinical deterioration. Furthermore, it is not anticipated that the patient will be medically stable for discharge from the hospital within 2 midnights of admission. The following factors support the patient status of inpatient.   Patient is waiting for nursing home placement  * I certify that at the point of admission it is my clinical judgment that the patient will require inpatient hospital care spanning beyond 2 midnights from the point of admission due to high intensity of service, high risk for further deterioration and high frequency of surveillance required.*    Family Communication: None  Disposition Plan: SNF   Consultants:  None  Procedures:  None  Antimicrobials:  None  DVT prophylaxis: Lovenox   Objective: Vitals:   10/22/19 1711 10/22/19 2032 10/23/19 0532 10/23/19 0900  BP: (!) 138/59 (!) 153/60 (!) 171/80 (!) 150/58  Pulse: 67 68 82 70  Resp: 18  18   Temp:  97.9 F (36.6 C) 98.6 F (37 C) 97.7 F (36.5 C)  TempSrc:  Oral Oral Oral  SpO2: 98% 94% 94% 93%  Weight: 78.8 kg     Height: 5\' 1"  (1.549 m)       Intake/Output Summary (Last 24 hours) at 10/23/2019 1026 Last data filed  at 10/23/2019 0500 Gross per 24 hour  Intake 240 ml  Output 900 ml  Net -660 ml   Filed Weights   10/18/19 1847 10/20/19 0458 10/22/19 1711  Weight: 77.2 kg 77 kg 78.8 kg   Body mass index is 32.82 kg/m.  Exam:  . General: 83 y.o. year-old female well developed well nourished in no acute distress.  Alert and oriented x3.  Overweight . Cardiovascular: Regular rate  and rhythm with no rubs or gallops.  No thyromegaly or JVD noted.   [name] Respiratory: Clear to auscultation with no wheezes or rales. Good inspiratory effort. . Abdomen: Soft nontender nondistended with normal bowel sounds x4 quadrants. . Musculoskeletal: No lower extremity edema. 2/4 pulses in all 4 extremities. . Skin: No ulcerative lesions noted or rashes, . Psychiatry: Mood is appropriate for condition and setting    Data Reviewed: CBC: Recent Labs  Lab 10/17/19 1757 10/19/19 0647 10/21/19 0242  WBC 11.0* 7.0 7.0  HGB 13.5 10.7* 10.0*  HCT 41.4 34.2* 31.8*  MCV 90.4 95.0 94.4  PLT 129* 106* [id number]*   Basic Metabolic Panel: Recent Labs  Lab 10/17/19 1757 10/18/19 0448 10/19/19 0647 10/21/19 0242  NA 139  --  141 140  K 5.7* 5.1 4.3 4.1  CL 108  --  114* 110  CO2 19*  --  18* 21*  GLUCOSE 118*  --  106* 102*  BUN 23  --  20 32*  CREATININE 1.10*  --  1.23* 1.56*  CALCIUM 9.5  --  8.6* 8.5*   GFR: Estimated Creatinine Clearance: 26.9 mL/min (A) (by C-G formula based on SCr of 1.56 mg/dL (H)). Liver Function Tests: Recent Labs  Lab 10/21/19 0242  AST 13*  ALT 15  ALKPHOS 85  BILITOT 0.7  PROT 5.5*  ALBUMIN 2.5*   No results for input(s): LIPASE, AMYLASE in the last 168 hours. No results for input(s): AMMONIA in the last 168 hours. Coagulation Profile: No results for input(s): INR, PROTIME in the last 168 hours. Cardiac Enzymes: No results for input(s): CKTOTAL, CKMB, CKMBINDEX, TROPONINI in the last 168 hours. BNP (last 3 results) No results for input(s): PROBNP in the last 8760 hours. HbA1C: No results for input(s): HGBA1C in the last 72 hours. CBG: Recent Labs  Lab 10/18/19 0214 10/20/19 1750  GLUCAP 114* 181*   Lipid Profile: No results for input(s): CHOL, HDL, LDLCALC, TRIG, CHOLHDL, LDLDIRECT in the last 72 hours. Thyroid Function Tests: No results for input(s): TSH, T4TOTAL, FREET4, T3FREE, THYROIDAB in the last 72 hours. Anemia Panel: No  results for input(s): VITAMINB12, FOLATE, FERRITIN, TIBC, IRON, RETICCTPCT in the last 72 hours. Urine analysis:    Component Value Date/Time   COLORURINE YELLOW 10/17/2019 2135   APPEARANCEUR HAZY (A) 10/17/2019 2135   LABSPEC 1.009 10/17/2019 2135   PHURINE 5.0 10/17/2019 2135   GLUCOSEU NEGATIVE 10/17/2019 2135   HGBUR NEGATIVE 10/17/2019 2135   [place] NEGATIVE 10/17/2019 2135   KETONESUR NEGATIVE 10/17/2019 2135   PROTEINUR >=300 (A) 10/17/2019 2135   UROBILINOGEN 0.2 09/04/2014 1340   NITRITE NEGATIVE 10/17/2019 2135   LEUKOCYTESUR NEGATIVE 10/17/2019 2135   Sepsis Labs: @LABRCNTIP (procalcitonin:4,lacticidven:4)  ) Recent Results (from the past 240 hour(s))  Blood culture (routine x 2)     Status: None   Collection Time: 10/17/19  9:20 PM   Specimen: BLOOD RIGHT ARM  Result Value Ref Range Status   Specimen Description BLOOD RIGHT ARM  Final   Special Requests   Final  BOTTLES DRAWN AEROBIC AND ANAEROBIC Blood Culture results may not be optimal due to an excessive volume of blood received in culture bottles   Culture   Final    NO GROWTH 5 DAYS Performed at [facility], [place] [address], [place], [place] [address]    Report Status 10/22/2019 FINAL  Final  Blood culture (routine x 2)     Status: None   Collection Time: 10/17/19  9:25 PM   Specimen: BLOOD RIGHT HAND  Result Value Ref Range Status   Specimen Description BLOOD RIGHT HAND  Final   Special Requests   Final    BOTTLES DRAWN AEROBIC ONLY Blood Culture results may not be optimal due to an inadequate volume of blood received in culture bottles   Culture   Final    NO GROWTH 5 DAYS Performed at [facility], [place] [address] [place], [place] [address]    Report Status 10/22/2019 FINAL  Final  SARS CORONAVIRUS 2 (TAT 6-24 HRS) Nasopharyngeal Nasopharyngeal Swab     Status: None   Collection Time: 10/18/19  8:28 AM   Specimen: Nasopharyngeal Swab  Result Value Ref Range Status   SARS  Coronavirus 2 NEGATIVE NEGATIVE Final    Comment: (NOTE) SARS-CoV-2 target nucleic acids are NOT DETECTED. The SARS-CoV-2 RNA is generally detectable in upper and lower respiratory specimens during the acute phase of infection. Negative results do not preclude SARS-CoV-2 infection, do not rule out co-infections with other pathogens, and should not be used as the sole basis for treatment or other patient management decisions. Negative results must be combined with clinical observations, patient history, and epidemiological information. The expected result is Negative. Fact Sheet for Patients: SugarRoll.be Fact Sheet for Healthcare Providers: https://www.woods-mathews.com/ This test is not yet approved or cleared by the [place] FDA and  has been authorized for detection and/or diagnosis of SARS-CoV-2 by FDA under an Emergency Use Authorization (EUA). This EUA will remain  in effect (meaning this test can be used) for the duration of the COVID-19 declaration under Section 56 4(b)(1) of the Act, 21 U.S.C. section 360bbb-3(b)(1), unless the authorization is terminated or revoked sooner. Performed at [facility], [place] [address], [place], [place] [address]       Studies: No results found.  Scheduled Meds: . aspirin EC  81 mg Oral q morning - 10a  . chlordiazePOXIDE  5 mg Oral q morning - 10a  . enoxaparin (LOVENOX) injection  30 mg Subcutaneous Q24H  . FLUoxetine  20 mg Oral Daily  . folic acid  1 mg Oral Daily  . furosemide  20 mg Oral Daily  . hydrALAZINE  100 mg Oral BID  . influenza vaccine adjuvanted  0.5 mL Intramuscular Tomorrow-1000  . levothyroxine  100 mcg Oral Q0600  . Melatonin  3 mg Oral QHS  . metoprolol succinate  75 mg Oral Daily  . QUEtiapine  200 mg Oral QHS  . simvastatin  20 mg Oral QHS  . sodium chloride flush  3 mL Intravenous Q12H  . venlafaxine XR  37.5 mg Oral Q breakfast  . vitamin B-12  1,000 mcg  Oral Daily    Continuous Infusions:    LOS: 4 days     Cristal Deer, MD [facility]  To reach me or the doctor on call, go to: [email or web address] Password [email or web address]  10/23/2019, 10:26 AM

## 2019-10-23 NOTE — TOC Progression Note (Signed)
Transition of Care [facility]) - Progression Note    Patient Details  Name: [name] [name] MRN: [id number] Date of Birth: 01-08-1936  Transition of Care [facility]) CM/SW [place], [place] Phone Number: 10/23/2019, 11:22 AM  Clinical Narrative:    HIPAA compliant message left for pt daughter [name] at [phone] to review offer preference. Aware pt daughter had requested CSW colleague Caren Griffins to send referral to [facility]. They are able to offer- await reutrn call.    Expected Discharge Plan: Skilled Nursing Facility Barriers to Discharge: SNF Pending bed offer, Insurance Authorization  Expected Discharge Plan and Services Expected Discharge Plan: [place] In-house Referral: Clinical Social Work Living arrangements for the past 2 months: Single Family Home   Social Determinants of Health (SDOH) Interventions    Readmission Risk Interventions Readmission Risk Prevention Plan 03/12/2019  Transportation Screening Complete  PCP or Specialist Appt within 3-5 Days Complete  HRI or [place] Complete  Social Work Consult for [place] Planning/Counseling Complete  Palliative Care Screening Not Applicable  Medication Review Press photographer) Complete  Some recent data might be hidden

## 2019-10-23 NOTE — TOC Progression Note (Signed)
Transition of Care [facility]) - Progression Note    Patient Details  Name: [name] [name] MRN: [id number] Date of Birth: 09/29/1936  Transition of Care [facility]) CM/SW [place], [place] Phone Number: 10/23/2019, 12:47 PM  Clinical Narrative:    CSW spoke with pt daughter [name] [name], we discussed offer from [facility] and pt daughter would like to accept. She had a question about whether or not it was a private room- confirmed and this information provided to pt daughter.   CSW provided support for decision making process being difficult on pt and pt daughter given that there is so many unknowns. CSW discussed visitation policy at [facility] and pt daughter understands that there is not inside visitation and that pt will be under 14 day quarantine.   CSW updated Levada Dy, admissions liaison at [facility] that pt family prefers placement at [facility].    Expected Discharge Plan: Skilled Nursing Facility Barriers to Discharge: Continued Medical Work up  Expected Discharge Plan and Services Expected Discharge Plan: [place] In-house Referral: Clinical Social Work Living arrangements for the past 2 months: Single Family Home   Social Determinants of Health (SDOH) Interventions    Readmission Risk Interventions Readmission Risk Prevention Plan 03/12/2019  Transportation Screening Complete  PCP or Specialist Appt within 3-5 Days Complete  HRI or [place] Complete  Social Work Consult for [place] Planning/Counseling Complete  Palliative Care Screening Not Applicable  Medication Review Press photographer) Complete  Some recent data might be hidden

## 2019-10-23 NOTE — Progress Notes (Signed)
Occupational Therapy Treatment Patient Details Name: ROSANGELICA COOLER MRN: [id number] DOB: [birth year] Today's Date: 10/23/2019    History of present illness 83 year old with dementia here with generalized weakness possible UTI.  Exam significant for signs of dehydration.  Patient has mild hyperkalemia and is received some IV fluids.  PMH positive for CVA, CAD, PAF, HTN, CHF, anxiety/depression.   OT comments  Pt making progress with functional goals. Pt able to transfer to [facility] min A - min guard A with RW, complete toileting tasks min A and grooming with set up while seated. OT will continue to follow acutely  Follow Up Recommendations  SNF;Supervision/Assistance - 24 hour    Equipment Recommendations  Other (comment)(TBD at next venue of care)    Recommendations for Other Services      Precautions / Restrictions Precautions Precautions: Fall Restrictions Weight Bearing Restrictions: No       Mobility Bed Mobility Overal bed mobility: Needs Assistance Bed Mobility: Supine to Sit;Sit to Supine     Supine to sit: Min assist     General bed mobility comments: min assist to elevate trunk  Transfers Overall transfer level: Needs assistance Equipment used: Rolling walker (2 wheeled) Transfers: Sit to/from Stand Sit to Stand: Min assist;Min guard         General transfer comment: min A from EOB to RW, min guard A from [facility]    Balance Overall balance assessment: Needs assistance Sitting-balance support: Feet supported Sitting balance-Leahy Scale: Fair     Standing balance support: Bilateral upper extremity supported;During functional activity Standing balance-Leahy Scale: Poor                             ADL either performed or assessed with clinical judgement   ADL Overall ADL's : Needs assistance/impaired Eating/Feeding: Independent;Sitting   Grooming: Wash/dry hands;Set up;Sitting       Lower Body Bathing: Moderate assistance;Sitting/lateral  leans           Toilet Transfer: Minimal assistance;Min guard;Ambulation;RW;Cueing for safety;BSC   Toileting- Clothing Manipulation and Hygiene: Minimal assistance;Sit to/from stand       Functional mobility during ADLs: Minimal assistance;Min guard;Cueing for safety;Rolling walker       Vision Patient Visual Report: No change from baseline     Perception     Praxis      Cognition Arousal/Alertness: Awake/alert Behavior During Therapy: WFL for tasks assessed/performed Overall Cognitive Status: History of cognitive impairments - at baseline                                          Exercises     Shoulder Instructions       General Comments      Pertinent Vitals/ Pain       Pain Assessment: No/denies pain Pain Score: 0-No pain Pain Intervention(s): Monitored during session  Home Living                                          Prior Functioning/Environment              Frequency  Min 2X/week        Progress Toward Goals  OT Goals(current goals can now be found in the care plan section)  Progress towards  OT goals: Progressing toward goals     Plan Discharge plan remains appropriate    Co-evaluation                 AM-PAC OT "6 Clicks" Daily Activity     Outcome Measure   Help from another person eating meals?: None Help from another person taking care of personal grooming?: A Little Help from another person toileting, which includes using toliet, bedpan, or urinal?: A Little Help from another person bathing (including washing, rinsing, drying)?: A Lot Help from another person to put on and taking off regular upper body clothing?: A Little Help from another person to put on and taking off regular lower body clothing?: A Lot 6 Click Score: 17    End of Session Equipment Utilized During Treatment: Gait belt;Rolling walker  OT Visit Diagnosis: Unsteadiness on feet (R26.81);Other abnormalities of  gait and mobility (R26.89);History of falling (Z91.81);Muscle weakness (generalized) (M62.81)   Activity Tolerance Patient tolerated treatment well   Patient Left in chair;with call bell/phone within reach;with chair alarm set   Nurse Communication          Time: [id number] OT Time Calculation (min): 31 min  Charges: OT General Charges $OT Visit: 1 Visit OT Treatments $Self Care/Home Management : 8-22 mins $Therapeutic Activity: 8-22 mins     Britt Bottom 10/23/2019, 12:55 PM

## 2019-10-23 NOTE — NC FL2 (Signed)
[place] LEVEL OF CARE SCREENING TOOL     IDENTIFICATION  Patient Name: [name] [name] Birthdate: 03-10-1936 Sex: female Admission Date (Current Location): 10/17/2019  [facility] and [place] Number:  Herbalist and Address:  [facility] [place], [place] [address], [place], [place] [address]      Provider Number: [id number]  Attending Physician Name and Address:  Cristal Deer, MD  Relative Name and Phone Number:       Current Level of Care: Hospital Recommended Level of Care: [place] Prior Approval Number:    Date Approved/Denied:   PASRR Number: [id number]  Discharge Plan: SNF    Current Diagnoses: Patient Active Problem List   Diagnosis Date Noted  . Falls 10/19/2019  . Generalized weakness 10/18/2019  . Dementia ([place]) 10/18/2019  . CKD (chronic kidney disease), stage III 10/18/2019  . Palliative care by specialist   . Goals of care, counseling/discussion   . Depression   . Metabolic encephalopathy [id number]  . Sepsis ([place]) 03/29/2019  . Bradycardia 03/29/2019  . Somnolence 03/29/2019  . Acute blood loss anemia   . Chronic diastolic congestive heart failure ([place])   . Weakness 03/13/2019  . Hypothermia 03/09/2019  . Elevated liver enzymes 03/09/2019  . Thrombocytopenia ([place]) 03/09/2019  . Hyperkalemia 07/23/2017  . AKI (acute kidney injury) ([place]) 07/23/2017  . 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 Fall 06/26/2017  . Intractable nausea and vomiting 03/11/2017  . Acute lower UTI 03/11/2017  . Carotid stenosis 12/28/2016  . Sedative, hypnotic, or anxiolytic withdrawal delirium 11/15/2016  . Paroxysmal atrial fibrillation (HCC)   . Cerebrovascular disease   . Fever   . Acute encephalopathy 11/10/2016  . Hypothyroidism 11/10/2016  . Hypertensive urgency 11/10/2016  . Altered mental state 11/10/2016  . Seizure ([place]) 11/10/2016  . Chronic diastolic CHF (congestive  heart failure) ([place])   . Left carotid stenosis 10/29/2016  . S/P total knee replacement 03/10/2016  . 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 Ileus ([place]) 09/05/2014  . Leukocytosis 09/05/2014  . Vomiting 09/04/2014  . Pulmonary edema 09/04/2014  . Hypokalemia 08/28/2014  . Enteritis due to Clostridium difficile 08/27/2014  . 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 Protein calorie malnutrition ([place]) 08/24/2014  . Obstructive jaundice 08/23/2014  . Acute cholangitis 08/23/2014  . ARF (acute renal failure) ([place]) 08/23/2014  . Coagulopathy ([place]) 08/23/2014  . Back pain   . Arthritis   . Hypertension   . GERD (gastroesophageal reflux disease)   . Anxiety state   . IBS (irritable bowel syndrome)   . Hernia of Right posterior flank 04/18/2012    Orientation RESPIRATION BLADDER Height & Weight     Self, Time, Situation, Place  Normal Incontinent, External catheter Weight: 173 lb 11.6 oz (78.8 kg) Height:  5\' 1"  (154.9 cm)  BEHAVIORAL SYMPTOMS/MOOD NEUROLOGICAL BOWEL NUTRITION STATUS      Continent Diet(see discharge summary)  AMBULATORY STATUS COMMUNICATION OF NEEDS Skin   Extensive Assist Verbally Other (Comment)(generalized ecchymosis)                       Personal Care Assistance Level of Assistance  Dressing, Feeding, Bathing Bathing Assistance: Maximum assistance Feeding assistance: Independent Dressing Assistance: Maximum assistance     Functional Limitations Info  Sight, Hearing, Speech Sight Info: Adequate Hearing Info: Impaired Speech Info: Adequate    SPECIAL CARE FACTORS FREQUENCY  PT (By licensed PT), OT (By  licensed OT)     PT Frequency: 5x week OT Frequency: 5x week            Contractures Contractures Info: Not present    Additional Factors Info  Code Status, Allergies, Psychotropic Code Status Info: DNR Allergies Info: Tetanus Toxoids, Snake Antivenin (Antivenin Crotalidae Polyvalent), Yellow Dyes (Non-tartrazine) Psychotropic  Info: chlordiazePOXIDE (LIBRIUM) capsule 5 mg every morning 10am; QUEtiapine (SEROQUEL) tablet 200 mg daily at bedtime; venlafaxine XR (EFFEXOR-XR) 24 hr capsule 37.5 mg daily with breakfast         Current Medications (10/23/2019):  This is the current hospital active medication list Current Facility-Administered Medications  Medication Dose Route Frequency Provider Last Rate Last Dose  . acetaminophen (TYLENOL) tablet 650 mg  650 mg Oral Q6H PRN Fuller Plan A, MD   650 mg at 10/23/19 [id number]   Or  . acetaminophen (TYLENOL) suppository 650 mg  650 mg Rectal Q6H PRN Fuller Plan A, MD      . albuterol (PROVENTIL) (2.5 MG/3ML) 0.083% nebulizer solution 2.5 mg  2.5 mg Nebulization Q6H PRN Fuller Plan A, MD      . aspirin EC tablet 81 mg  81 mg Oral q morning - 10a Vann, Jessica U, DO   81 mg at 10/23/19 1120  . chlordiazePOXIDE (LIBRIUM) capsule 5 mg  5 mg Oral q morning - 10a Vann, Jessica U, DO   5 mg at 10/23/19 1117  . enoxaparin (LOVENOX) injection 30 mg  30 mg Subcutaneous Q24H Karren Cobble, RPH   30 mg at 10/23/19 1130  . FLUoxetine (PROZAC) capsule 20 mg  20 mg Oral Daily Vann, Jessica U, DO   20 mg at 10/23/19 1114  . folic acid (FOLVITE) tablet 1 mg  1 mg Oral Daily Vann, Jessica U, DO   1 mg at 10/23/19 1118  . furosemide (LASIX) tablet 20 mg  20 mg Oral Daily Vann, Jessica U, DO   20 mg at 10/23/19 1120  . hydrALAZINE (APRESOLINE) tablet 100 mg  100 mg Oral BID Vann, Jessica U, DO   100 mg at 10/23/19 1122  . hyoscyamine (LEVBID) 0.375 MG 12 hr tablet 0.375 mg  0.375 mg Oral Q12H PRN Smith, Rondell A, MD      . influenza vaccine adjuvanted (FLUAD) injection 0.5 mL  0.5 mL Intramuscular Tomorrow-1000 Smith, Rondell A, MD      . levothyroxine (SYNTHROID) tablet 100 mcg  100 mcg Oral Q0600 Fuller Plan A, MD   100 mcg at 10/23/19 0528  . Melatonin TABS 3 mg  3 mg Oral QHS Vann, Jessica U, DO   3 mg at 10/22/19 2213  . metoprolol succinate (TOPROL-XL) 24 hr tablet 75 mg   75 mg Oral Daily Vann, Jessica U, DO   75 mg at 10/23/19 1121  . ondansetron (ZOFRAN) tablet 4 mg  4 mg Oral Q6H PRN Fuller Plan A, MD       Or  . ondansetron (ZOFRAN) injection 4 mg  4 mg Intravenous Q6H PRN Fuller Plan A, MD   4 mg at 10/20/19 1251  . promethazine (PHENERGAN) injection 6.25 mg  6.25 mg Intravenous Q6H PRN Eulogio Bear U, DO   6.25 mg at 10/19/19 2111  . QUEtiapine (SEROQUEL) tablet 200 mg  200 mg Oral QHS Vann, Jessica U, DO   200 mg at 10/22/19 2122  . simvastatin (ZOCOR) tablet 20 mg  20 mg Oral QHS Vann, Jessica U, DO   20 mg at 10/22/19 2122  .  sodium chloride flush (NS) 0.9 % injection 3 mL  3 mL Intravenous Q12H Smith, Rondell A, MD   3 mL at 10/21/19 2208  . venlafaxine XR (EFFEXOR-XR) 24 hr capsule 37.5 mg  37.5 mg Oral Q breakfast Eulogio Bear U, DO   37.5 mg at 10/23/19 0809  . vitamin B-12 (CYANOCOBALAMIN) tablet 1,000 mcg  1,000 mcg Oral Daily Eulogio Bear U, DO   1,000 mcg at 10/23/19 1119  . zolpidem (AMBIEN) tablet 5 mg  5 mg Oral QHS PRN Vertis Kelch, NP   5 mg at 10/22/19 2314     Discharge Medications: Please see discharge summary for a list of discharge medications.  Relevant Imaging Results:  Relevant Lab Results:   Additional Information [id number] [place], [place]

## 2019-10-23 NOTE — Consult Note (Signed)
   [facility] CM Inpatient Consult   10/23/2019  INEZ STANTZ 1936/03/28 [id number]    Patientreviewed for 48% extreme high risk score for readmission and hospitalizations; and to check for [place] Managementneeds under her Medicare/ [employer] ACO plan.  Chart review reveals PT/ OT recommendation for disposition to skilled nursing facility- SNF ([facility]).   No identifiable [facility] Care Management needs at this time, as patient's care will be met at the skilled level of care.   For questions, please call:  Edwena Felty A. Aubreanna Percle, BSN, RN-BC [facility] Liaison Cell: [phone]

## 2019-10-24 DIAGNOSIS — M6281 Muscle weakness (generalized): Secondary | ICD-10-CM | POA: Diagnosis not present

## 2019-10-24 DIAGNOSIS — I509 Heart failure, unspecified: Secondary | ICD-10-CM | POA: Diagnosis not present

## 2019-10-24 DIAGNOSIS — Z7401 Bed confinement status: Secondary | ICD-10-CM | POA: Diagnosis not present

## 2019-10-24 DIAGNOSIS — R278 Other lack of coordination: Secondary | ICD-10-CM | POA: Diagnosis not present

## 2019-10-24 DIAGNOSIS — R41841 Cognitive communication deficit: Secondary | ICD-10-CM | POA: Diagnosis not present

## 2019-10-24 DIAGNOSIS — I10 Essential (primary) hypertension: Secondary | ICD-10-CM | POA: Diagnosis not present

## 2019-10-24 DIAGNOSIS — Z9181 History of falling: Secondary | ICD-10-CM | POA: Diagnosis not present

## 2019-10-24 DIAGNOSIS — R531 Weakness: Secondary | ICD-10-CM | POA: Diagnosis not present

## 2019-10-24 DIAGNOSIS — E039 Hypothyroidism, unspecified: Secondary | ICD-10-CM | POA: Diagnosis not present

## 2019-10-24 DIAGNOSIS — N1832 Chronic kidney disease, stage 3b: Secondary | ICD-10-CM | POA: Diagnosis not present

## 2019-10-24 DIAGNOSIS — I5032 Chronic diastolic (congestive) heart failure: Secondary | ICD-10-CM | POA: Diagnosis not present

## 2019-10-24 DIAGNOSIS — I480 Paroxysmal atrial fibrillation: Secondary | ICD-10-CM | POA: Diagnosis not present

## 2019-10-24 DIAGNOSIS — R2681 Unsteadiness on feet: Secondary | ICD-10-CM | POA: Diagnosis not present

## 2019-10-24 DIAGNOSIS — F322 Major depressive disorder, single episode, severe without psychotic features: Secondary | ICD-10-CM | POA: Diagnosis not present

## 2019-10-24 DIAGNOSIS — N183 Chronic kidney disease, stage 3 unspecified: Secondary | ICD-10-CM | POA: Diagnosis not present

## 2019-10-24 DIAGNOSIS — Z8673 Personal history of transient ischemic attack (TIA), and cerebral infarction without residual deficits: Secondary | ICD-10-CM | POA: Diagnosis not present

## 2019-10-24 DIAGNOSIS — M2550 Pain in unspecified joint: Secondary | ICD-10-CM | POA: Diagnosis not present

## 2019-10-24 DIAGNOSIS — Z23 Encounter for immunization: Secondary | ICD-10-CM | POA: Diagnosis not present

## 2019-10-24 DIAGNOSIS — D696 Thrombocytopenia, unspecified: Secondary | ICD-10-CM | POA: Diagnosis not present

## 2019-10-24 DIAGNOSIS — G4700 Insomnia, unspecified: Secondary | ICD-10-CM | POA: Diagnosis not present

## 2019-10-24 DIAGNOSIS — E669 Obesity, unspecified: Secondary | ICD-10-CM | POA: Diagnosis not present

## 2019-10-24 DIAGNOSIS — F419 Anxiety disorder, unspecified: Secondary | ICD-10-CM | POA: Diagnosis not present

## 2019-10-24 DIAGNOSIS — F323 Major depressive disorder, single episode, severe with psychotic features: Secondary | ICD-10-CM | POA: Diagnosis not present

## 2019-10-24 DIAGNOSIS — F0390 Unspecified dementia without behavioral disturbance: Secondary | ICD-10-CM | POA: Diagnosis not present

## 2019-10-24 DIAGNOSIS — I700 Atherosclerosis of aorta: Secondary | ICD-10-CM | POA: Diagnosis not present

## 2019-10-24 LAB — SARS CORONAVIRUS 2 (TAT 6-24 HRS): SARS Coronavirus 2: NEGATIVE

## 2019-10-24 MED ORDER — HYDRALAZINE HCL 100 MG PO TABS: 100.0000 mg | ORAL_TABLET | Freq: Two times a day (BID) | ORAL | 0 refills | Status: AC

## 2019-10-24 MED ORDER — ASPIRIN EC 81 MG PO TBEC: 81.0000 mg | DELAYED_RELEASE_TABLET | Freq: Every morning | ORAL | 0 refills | Status: AC

## 2019-10-24 MED ORDER — FUROSEMIDE 20 MG PO TABS: 20.0000 mg | ORAL_TABLET | Freq: Every day | ORAL | 1 refills | Status: AC

## 2019-10-24 MED ORDER — CYANOCOBALAMIN 1000 MCG PO TABS: 1000.0000 ug | ORAL_TABLET | Freq: Every day | ORAL | 0 refills | Status: AC

## 2019-10-24 MED ORDER — SIMVASTATIN 20 MG PO TABS: 20.0000 mg | ORAL_TABLET | Freq: Every day | ORAL | 0 refills | Status: AC

## 2019-10-24 MED ORDER — METOPROLOL SUCCINATE ER 25 MG PO TB24: 75.0000 mg | ORAL_TABLET | Freq: Every day | ORAL | 0 refills | Status: AC

## 2019-10-24 MED ORDER — FOLIC ACID 1 MG PO TABS: 1.0000 mg | ORAL_TABLET | Freq: Every day | ORAL | 0 refills | Status: AC

## 2019-10-24 MED ORDER — QUETIAPINE FUMARATE 100 MG PO TABS: 200.0000 mg | ORAL_TABLET | Freq: Every day | ORAL | 0 refills | Status: AC

## 2019-10-24 MED ORDER — LEVOTHYROXINE SODIUM 100 MCG PO TABS: 100.0000 ug | ORAL_TABLET | Freq: Every day | ORAL | 0 refills | Status: AC

## 2019-10-24 MED ORDER — ACETAMINOPHEN 500 MG PO TABS: 500.0000 mg | ORAL_TABLET | Freq: Four times a day (QID) | ORAL | 0 refills | Status: AC | PRN

## 2019-10-24 MED ORDER — ZOLPIDEM TARTRATE 5 MG PO TABS: 5.0000 mg | ORAL_TABLET | Freq: Every evening | ORAL | 0 refills | Status: AC | PRN

## 2019-10-24 MED ORDER — CHLORDIAZEPOXIDE HCL 5 MG PO CAPS: 5.0000 mg | ORAL_CAPSULE | Freq: Every morning | ORAL | 0 refills | Status: AC

## 2019-10-24 MED ORDER — VENLAFAXINE HCL ER 37.5 MG PO CP24: 37.5000 mg | ORAL_CAPSULE | Freq: Every day | ORAL | 0 refills | Status: AC

## 2019-10-24 MED ORDER — THIAMINE HCL 100 MG PO TABS: 100.0000 mg | ORAL_TABLET | Freq: Every day | ORAL | 0 refills | Status: AC

## 2019-10-24 MED ORDER — HYOSCYAMINE SULFATE ER 0.375 MG PO TB12: 0.3750 mg | ORAL_TABLET | Freq: Two times a day (BID) | ORAL | 0 refills | Status: AC

## 2019-10-24 MED ORDER — FLUOXETINE HCL 20 MG PO CAPS: 20.0000 mg | ORAL_CAPSULE | Freq: Every day | ORAL | 0 refills | Status: AC

## 2019-10-24 NOTE — TOC Progression Note (Signed)
Transition of Care [facility]) - Progression Note    Patient Details  Name: [name] [name] MRN: [id number] Date of Birth: [birth year]  Transition of Care [facility]) CM/SW [place], [place] Phone Number: 10/24/2019, 8:33 AM  Clinical Narrative:    CSW had requested Dr. Kyung Bacca to order [place] on 11/10; bedside RN alerted CSW early that pt was not ordered a new swab. Lauren, bedside RN will request new swab for pt for SNF discharge. Pt stable per notes on 11/10, bed available at [facility]. Once resulted pt can dc to [facility].    Expected Discharge Plan: Skilled Nursing Facility Barriers to Discharge: Continued Medical Work up  Expected Discharge Plan and Services Expected Discharge Plan: [place] In-house Referral: Clinical Social Work Living arrangements for the past 2 months: Single Family Home     Social Determinants of Health (SDOH) Interventions    Readmission Risk Interventions Readmission Risk Prevention Plan 10/23/2019 03/12/2019  Transportation Screening Complete Complete  PCP or Specialist Appt within 3-5 Days - Complete  HRI or [place] - Complete  Social Work Consult for [place] Planning/Counseling - Complete  Palliative Care Screening - Not Applicable  Medication Review Press photographer) Complete Complete  PCP or Specialist appointment within 3-5 days of discharge Not Complete -  PCP/Specialist Appt Not Complete comments plan for snf -  HRI or Home Care Consult Complete -  SW Recovery Care/Counseling Consult Complete -  Palliative Care Screening Not Applicable -  Skilled Nursing Facility Complete -  Some recent data might be hidden

## 2019-10-24 NOTE — Plan of Care (Signed)
  Problem: Activity: Goal: Risk for activity intolerance will decrease Outcome: Progressing   Problem: Nutrition: Goal: Adequate nutrition will be maintained Outcome: Progressing   Problem: Coping: Goal: Level of anxiety will decrease Outcome: Progressing   

## 2019-10-24 NOTE — Discharge Summary (Signed)
Physician Discharge Summary  [name] [name] [id number] DOB: 1936-12-05 DOA: 10/17/2019  PCP: [name], MD  Admit date: 10/17/2019 Discharge date: 10/24/2019  Admitted From: Home Disposition:  [facility]  Recommendations for Outpatient Follow-up:  1. Follow up with PCP in 1-2 weeks 2. Please obtain BMP in one week  Home Health: No Equipment/Devices: None  Discharge Condition: Stable CODE STATUS: DNR Diet recommendation: Heart Healthy  History of present illness:  [name] [name] is an 83 year old female with medical history significant for diastolic congestive heart failure, paroxysmal atrial fibrillation, chronic kidney disease thyroid disease, and dementia who was admitted for progressive functioning decline with weakness over the last month. She was stated to have gone from needing assistance to stand to not able to stand with assistance or get out of bed she stated that her legs were not working.  Hospital course:  Generalized weakness, acute on chronic Patient presenting with progressive worsening generalized weakness, now unable to stand without assistance.  Previous MRI in June 2020 without any signs of acute stroke or NPH.  CT head on admission without acute intracranial pathology.  CT abdomen/pelvis without acute intra-abdominal pathology.  No appreciable infectious etiology.  PT/OT evaluate during inpatient hospitalization with recommendations of SNF.  Will discharge to [facility].  Leukocytosis: Resolved WBC elevated at 11.0 on admission.  Chest x-ray and urinalysis unrevealing.  No antibiotics given.  Resolved.  Dementia Depression Continue Librium 5 mg p.o. every morning, fluoxetine 20 g p.o. daily, Seroquel 200 mg p.o. nightly, venlafaxine Exar 37.5 mg p.o. daily.  CKD stage IIIb GFR appears to be 35-45 over the past year, with baseline creatinine of oh to 1.50.  Creatinine 1.56 at time of discharge.  Recommend repeat BMP in 1  week.  Paroxysmal atrial fibrillation EKG on admission notable for sinus rhythm with first-degree AV block.  Not on chronic anticoagulation outpatient.  Continue rate control with metoprolol succinate 75 mg p.o. daily.  Essential hypertension Continue hydralazine 100 mg p.o. twice daily, metoprolol succinate 75 mcg p.o. daily.  On Lasix 20 mg p.o. daily.  Continue aspirin 81 mg p.o. daily and simvastatin.  Chronic diastolic congestive heart failure, currently compensated Continue home furosemide 20 mg p.o. daily.  Recommend to monitor daily weights.  Hypothyroidism TSH 2.658.  Continue thyroxine 100 mcg p.o. daily  Obesity BMI 32.82, discussed need for lifestyle changes as this complicates all facets of care.  Insomnia Continue Ambien 5 mg p.o. nightly as needed  Discharge Diagnoses:  Principal Problem:   Generalized weakness Active Problems:   Chronic diastolic CHF (congestive heart failure) (HCC)   Hypothyroidism   Thrombocytopenia (HCC)   Dementia (HCC)   CKD (chronic kidney disease), stage III   Falls    Discharge Instructions  Discharge Instructions    Diet - low sodium heart healthy   Complete by: As directed    Increase activity slowly   Complete by: As directed      Allergies as of 10/24/2019      Reactions   Tetanus Toxoids Swelling, Other (See Comments)   Arm (site) became swollen as a child   Snake Antivenin [antivenin Crotalidae Polyvalent] Other (See Comments)   Just can't take per daughter   Yellow Dyes (non-tartrazine) Itching, Other (See Comments)   Makes patient nervous       Medication List    STOP taking these medications   fluticasone 50 MCG/ACT nasal spray Commonly known as: FLONASE   ketotifen 0.025 % ophthalmic solution  Commonly known as: ZADITOR     TAKE these medications   acetaminophen 500 MG tablet Commonly known as: TYLENOL Take 1 tablet (500 mg total) by mouth every 6 (six) hours as needed for mild pain or headache  (pain). What changed:   how much to take  reasons to take this  Another medication with the same name was removed. Continue taking this medication, and follow the directions you see here.   aspirin EC 81 MG tablet Take 1 tablet (81 mg total) by mouth every morning.   chlordiazePOXIDE 5 MG capsule Commonly known as: LIBRIUM Take 1 capsule (5 mg total) by mouth every morning. Start taking on: October 25, 2019   cyanocobalamin 1000 MCG tablet Take 1 tablet (1,000 mcg total) by mouth daily. Start taking on: October 25, 2019   FLUoxetine 20 MG capsule Commonly known as: PROZAC Take 1 capsule (20 mg total) by mouth daily.   folic acid 1 MG tablet Commonly known as: FOLVITE Take 1 tablet (1 mg total) by mouth daily.   furosemide 20 MG tablet Commonly known as: LASIX Take 1 tablet (20 mg total) by mouth daily.   hydrALAZINE 100 MG tablet Commonly known as: APRESOLINE Take 1 tablet (100 mg total) by mouth 2 (two) times daily. What changed:   medication strength  how much to take  when to take this   hyoscyamine 0.375 MG 12 hr tablet Commonly known as: LEVBID Take 1 tablet (0.375 mg total) by mouth 2 (two) times daily.   levothyroxine 100 MCG tablet Commonly known as: SYNTHROID Take 1 tablet (100 mcg total) by mouth daily at 6 (six) AM.   metoprolol succinate 25 MG 24 hr tablet Commonly known as: TOPROL-XL Take 3 tablets (75 mg total) by mouth daily. Start taking on: October 25, 2019   QUEtiapine 100 MG tablet Commonly known as: SEROQUEL Take 2 tablets (200 mg total) by mouth at bedtime. What changed: Another medication with the same name was removed. Continue taking this medication, and follow the directions you see here.   simvastatin 20 MG tablet Commonly known as: ZOCOR Take 1 tablet (20 mg total) by mouth at bedtime. What changed: Another medication with the same name was removed. Continue taking this medication, and follow the directions you see here.    thiamine 100 MG tablet Take 1 tablet (100 mg total) by mouth daily.   venlafaxine XR 37.5 MG 24 hr capsule Commonly known as: EFFEXOR-XR Take 1 capsule (37.5 mg total) by mouth daily with breakfast.   zolpidem 5 MG tablet Commonly known as: AMBIEN Take 1 tablet (5 mg total) by mouth at bedtime as needed for sleep. What changed: Another medication with the same name was removed. Continue taking this medication, and follow the directions you see here.       Contact information for follow-up providers    [name], MD. Schedule an appointment as soon as possible for a visit in 1 week(s).   Specialty: Internal Medicine Contact information: [address] [place] [address] [phone]            Contact information for after-discharge care    Destination    [facility] .   Service: Skilled Nursing Contact information: [place] [phone]                 Allergies  Allergen Reactions  . Tetanus Toxoids Swelling and Other (See Comments)  Arm (site) became swollen as a child  . Snake Antivenin [Antivenin Crotalidae Polyvalent] Other (See Comments)    Just can't take per daughter  . Yellow Dyes (Non-Tartrazine) Itching and Other (See Comments)    Makes patient nervous     Consultations:  none   Procedures/Studies: Ct Head Wo Contrast  Result Date: 10/18/2019 CLINICAL DATA:  83 year old female with increasing body weakness x6 weeks. History of dementia. EXAM: CT HEAD WITHOUT CONTRAST TECHNIQUE: Contiguous axial images were obtained from the base of the skull through the vertex without intravenous contrast. COMPARISON:  Head CT dated 06/03/2019 FINDINGS: Brain: There is mild age-related atrophy and moderate chronic microvascular ischemic changes. Small left lentiform nucleus old lacunar infarct. There is an 8 mm hypodense focus in the right caudate head  consistent with an age indeterminate infarct, likely old. This is however new since the CT of 06/03/2019. Clinical correlation is recommended. There is no acute intracranial hemorrhage. No mass effect or midline shift. No extra-axial fluid collection. Vascular: No hyperdense vessel or unexpected calcification. Skull: Normal. Negative for fracture or focal lesion. Sinuses/Orbits: The visualized paranasal sinuses are clear. The left mastoid air cell is clear. There is partial opacification of the right mastoid air cells. Other: None IMPRESSION: 1. No acute intracranial hemorrhage. Age-related atrophy and chronic microvascular ischemic changes. 2. 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 Clinical correlation is recommended. Electronically Signed   By: Anner Crete M.D.   On: 10/18/2019 08:29   Ct Abdomen Pelvis W Contrast  Result Date: 10/18/2019 CLINICAL DATA:  Acute abdominal pain.  Urinary tract infection. EXAM: CT ABDOMEN AND PELVIS WITH CONTRAST TECHNIQUE: Multidetector CT imaging of the abdomen and pelvis was performed using the standard protocol following bolus administration of intravenous contrast. CONTRAST:  157mL OMNIPAQUE IOHEXOL 300 MG/ML  SOLN COMPARISON:  CT abdomen pelvis dated August 25, 2017. FINDINGS: Lower chest: No acute abnormality. Chronic mild bibasilar scarring/fibrosis. Hepatobiliary: No focal liver abnormality is seen. Status post cholecystectomy. No biliary dilatation. Pancreas: Moderate atrophy. No ductal dilatation or surrounding inflammatory changes. Spleen: Normal in size without focal abnormality. Adrenals/Urinary Tract: The adrenal glands are unremarkable. Small bilateral renal cysts. No renal calculi or hydronephrosis. Small amount of contrast in the bladder. The bladder is otherwise unremarkable. Stomach/Bowel: Stomach is within normal limits. Appendix appears normal. No evidence of bowel wall thickening, distention, or inflammatory changes. Unchanged  duodenal diverticulum. Mild colonic diverticulosis. Vascular/Lymphatic: Aortic atherosclerosis. No enlarged abdominal or pelvic lymph nodes. Reproductive: Status post hysterectomy. No adnexal masses. Other: No abdominal wall hernia or abnormality. No abdominopelvic ascites. No pneumoperitoneum. Musculoskeletal: No acute or significant osseous findings. Extensive prior thoracolumbar fusion. IMPRESSION: 1.  No acute intra-abdominal process. 2.  Aortic atherosclerosis (ICD10-I70.0). Electronically Signed   By: Titus Dubin M.D.   On: 10/18/2019 18:47   Dg Chest Port 1 View  Result Date: 10/18/2019 CLINICAL DATA:  Weakness EXAM: PORTABLE CHEST 1 VIEW COMPARISON:  June 03, 2019 FINDINGS: The mediastinal contour and cardiac silhouette are stable. Heart size is enlarged. There is no focal infiltrate, pulmonary edema, or pleural effusion. Patient is status post prior fixation of the left clavicle and left shoulder and lumbar spine unchanged. IMPRESSION: No acute cardiopulmonary disease identified.  Stable cardiomegaly. Electronically Signed   By: Abelardo Diesel M.D.   On: 10/18/2019 07:39      Subjective: Patient seen and examined at bedside, resting comfortably.  Pleasantly confused.  Ready for discharge to rehab.  No other complaints or concerns at this time.  Denies headache, no fever/chills/night sweats, no nausea/vomiting/diarrhea, no chest pain, palpitations, no abdominal pain, no fatigue, no cough/congestion, no paresthesias.  No acute events overnight per nurse staff.   Discharge Exam: Vitals:   10/24/19 0900 10/24/19 1100  BP: (!) 161/61 (!) 145/65  Pulse: 86 67  Resp: 20   Temp: 98.3 F (36.8 C)   SpO2: 91% 94%   Vitals:   10/23/19 2305 10/24/19 0441 10/24/19 0900 10/24/19 1100  BP: (!) 165/79 (!) 152/59 (!) 161/61 (!) 145/65  Pulse:  74 86 67  Resp:  18 20   Temp:  98.1 F (36.7 C) 98.3 F (36.8 C)   TempSrc:  Oral Oral   SpO2:  95% 91% 94%  Weight:      Height:         General: Pt is alert, awake, not in acute distress Cardiovascular: RRR, S1/S2 +, no rubs, no gallops Respiratory: CTA bilaterally, no wheezing, no rhonchi Abdominal: Soft, NT, ND, bowel sounds + Extremities: no edema, no cyanosis    The results of significant diagnostics from this hospitalization (including imaging, microbiology, ancillary and laboratory) are listed below for reference.     Microbiology: Recent Results (from the past 240 hour(s))  Blood culture (routine x 2)     Status: None   Collection Time: 10/17/19  9:20 PM   Specimen: BLOOD RIGHT ARM  Result Value Ref Range Status   Specimen Description BLOOD RIGHT ARM  Final   Special Requests   Final    BOTTLES DRAWN AEROBIC AND ANAEROBIC Blood Culture results may not be optimal due to an excessive volume of blood received in culture bottles   Culture   Final    NO GROWTH 5 DAYS Performed at [facility], [place] [address] [place], [place] [address]    Report Status 10/22/2019 FINAL  Final  Blood culture (routine x 2)     Status: None   Collection Time: 10/17/19  9:25 PM   Specimen: BLOOD RIGHT HAND  Result Value Ref Range Status   Specimen Description BLOOD RIGHT HAND  Final   Special Requests   Final    BOTTLES DRAWN AEROBIC ONLY Blood Culture results may not be optimal due to an inadequate volume of blood received in culture bottles   Culture   Final    NO GROWTH 5 DAYS Performed at [facility], [place] [address], [place], [place] [address]    Report Status 10/22/2019 FINAL  Final  SARS CORONAVIRUS 2 (TAT 6-24 HRS) Nasopharyngeal Nasopharyngeal Swab     Status: None   Collection Time: 10/18/19  8:28 AM   Specimen: Nasopharyngeal Swab  Result Value Ref Range Status   SARS Coronavirus 2 NEGATIVE NEGATIVE Final    Comment: (NOTE) SARS-CoV-2 target nucleic acids are NOT DETECTED. The SARS-CoV-2 RNA is generally detectable in upper and lower respiratory specimens during the acute phase of infection.  Negative results do not preclude SARS-CoV-2 infection, do not rule out co-infections with other pathogens, and should not be used as the sole basis for treatment or other patient management decisions. Negative results must be combined with clinical observations, patient history, and epidemiological information. The expected result is Negative. Fact Sheet for Patients: SugarRoll.be Fact Sheet for Healthcare Providers: https://www.woods-mathews.com/ This test is not yet approved or cleared by the [place] FDA and  has been authorized for detection and/or diagnosis of SARS-CoV-2 by FDA under an Emergency Use Authorization (EUA). This EUA will remain  in effect (meaning  this test can be used) for the duration of the COVID-19 declaration under Section 56 4(b)(1) of the Act, 21 U.S.C. section 360bbb-3(b)(1), unless the authorization is terminated or revoked sooner. Performed at [facility], [place] [address], [place], [place] [address]   SARS CORONAVIRUS 2 (TAT 6-24 HRS) Nasopharyngeal Nasopharyngeal Swab     Status: None   Collection Time: 10/24/19  9:01 AM   Specimen: Nasopharyngeal Swab  Result Value Ref Range Status   SARS Coronavirus 2 NEGATIVE NEGATIVE Final    Comment: (NOTE) SARS-CoV-2 target nucleic acids are NOT DETECTED. The SARS-CoV-2 RNA is generally detectable in upper and lower respiratory specimens during the acute phase of infection. Negative results do not preclude SARS-CoV-2 infection, do not rule out co-infections with other pathogens, and should not be used as the sole basis for treatment or other patient management decisions. Negative results must be combined with clinical observations, patient history, and epidemiological information. The expected result is Negative. Fact Sheet for Patients: SugarRoll.be Fact Sheet for Healthcare  Providers: https://www.woods-mathews.com/ This test is not yet approved or cleared by the [place] FDA and  has been authorized for detection and/or diagnosis of SARS-CoV-2 by FDA under an Emergency Use Authorization (EUA). This EUA will remain  in effect (meaning this test can be used) for the duration of the COVID-19 declaration under Section 56 4(b)(1) of the Act, 21 U.S.C. section 360bbb-3(b)(1), unless the authorization is terminated or revoked sooner. Performed at [facility], [place] [address], [place], [place] [address]      Labs: BNP (last 3 results) No results for input(s): BNP in the last 8760 hours. Basic Metabolic Panel: Recent Labs  Lab 10/17/19 1757 10/18/19 0448 10/19/19 0647 10/21/19 0242  NA 139  --  141 140  K 5.7* 5.1 4.3 4.1  CL 108  --  114* 110  CO2 19*  --  18* 21*  GLUCOSE 118*  --  106* 102*  BUN 23  --  20 32*  CREATININE 1.10*  --  1.23* 1.56*  CALCIUM 9.5  --  8.6* 8.5*   Liver Function Tests: Recent Labs  Lab 10/21/19 0242  AST 13*  ALT 15  ALKPHOS 85  BILITOT 0.7  PROT 5.5*  ALBUMIN 2.5*   No results for input(s): LIPASE, AMYLASE in the last 168 hours. No results for input(s): AMMONIA in the last 168 hours. CBC: Recent Labs  Lab 10/17/19 1757 10/19/19 0647 10/21/19 0242  WBC 11.0* 7.0 7.0  HGB 13.5 10.7* 10.0*  HCT 41.4 34.2* 31.8*  MCV 90.4 95.0 94.4  PLT 129* 106* 107*   Cardiac Enzymes: No results for input(s): CKTOTAL, CKMB, CKMBINDEX, TROPONINI in the last 168 hours. BNP: Invalid input(s): POCBNP CBG: Recent Labs  Lab 10/18/19 0214 10/20/19 1750  GLUCAP 114* 181*   D-Dimer No results for input(s): DDIMER in the last 72 hours. Hgb A1c No results for input(s): HGBA1C in the last 72 hours. Lipid Profile No results for input(s): CHOL, HDL, LDLCALC, TRIG, CHOLHDL, LDLDIRECT in the last 72 hours. Thyroid function studies No results for input(s): TSH, T4TOTAL, T3FREE, THYROIDAB in the last 72  hours.  Invalid input(s): FREET3 Anemia work up No results for input(s): VITAMINB12, FOLATE, FERRITIN, TIBC, IRON, RETICCTPCT in the last 72 hours. Urinalysis    Component Value Date/Time   COLORURINE YELLOW 10/17/2019 2135   APPEARANCEUR HAZY (A) 10/17/2019 2135   LABSPEC 1.009 10/17/2019 2135   PHURINE 5.0 10/17/2019 2135   GLUCOSEU NEGATIVE 10/17/2019  2135   [place] NEGATIVE 10/17/2019 2135   [place] NEGATIVE 10/17/2019 2135   KETONESUR NEGATIVE 10/17/2019 2135   PROTEINUR >=300 (A) 10/17/2019 2135   UROBILINOGEN 0.2 09/04/2014 1340   NITRITE NEGATIVE 10/17/2019 2135   LEUKOCYTESUR NEGATIVE 10/17/2019 2135   Sepsis Labs Invalid input(s): PROCALCITONIN,  WBC,  LACTICIDVEN Microbiology Recent Results (from the past 240 hour(s))  Blood culture (routine x 2)     Status: None   Collection Time: 10/17/19  9:20 PM   Specimen: BLOOD RIGHT ARM  Result Value Ref Range Status   Specimen Description BLOOD RIGHT ARM  Final   Special Requests   Final    BOTTLES DRAWN AEROBIC AND ANAEROBIC Blood Culture results may not be optimal due to an excessive volume of blood received in culture bottles   Culture   Final    NO GROWTH 5 DAYS Performed at [facility], [place] [address] [place], [place] [address]    Report Status 10/22/2019 FINAL  Final  Blood culture (routine x 2)     Status: None   Collection Time: 10/17/19  9:25 PM   Specimen: BLOOD RIGHT HAND  Result Value Ref Range Status   Specimen Description BLOOD RIGHT HAND  Final   Special Requests   Final    BOTTLES DRAWN AEROBIC ONLY Blood Culture results may not be optimal due to an inadequate volume of blood received in culture bottles   Culture   Final    NO GROWTH 5 DAYS Performed at [facility], [place] [address], [place],  [address]    Report Status 10/22/2019 FINAL  Final  SARS CORONAVIRUS 2 (TAT 6-24 HRS) Nasopharyngeal Nasopharyngeal Swab     Status: None   Collection Time: 10/18/19  8:28 AM   Specimen:  Nasopharyngeal Swab  Result Value Ref Range Status   SARS Coronavirus 2 NEGATIVE NEGATIVE Final    Comment: (NOTE) SARS-CoV-2 target nucleic acids are NOT DETECTED. The SARS-CoV-2 RNA is generally detectable in upper and lower respiratory specimens during the acute phase of infection. Negative results do not preclude SARS-CoV-2 infection, do not rule out co-infections with other pathogens, and should not be used as the sole basis for treatment or other patient management decisions. Negative results must be combined with clinical observations, patient history, and epidemiological information. The expected result is Negative. Fact Sheet for Patients: SugarRoll.be Fact Sheet for Healthcare Providers: https://www.woods-mathews.com/ This test is not yet approved or cleared by the [place] FDA and  has been authorized for detection and/or diagnosis of SARS-CoV-2 by FDA under an Emergency Use Authorization (EUA). This EUA will remain  in effect (meaning this test can be used) for the duration of the COVID-19 declaration under Section 56 4(b)(1) of the Act, 21 U.S.C. section 360bbb-3(b)(1), unless the authorization is terminated or revoked sooner. Performed at [facility], [place] [address] [place], [place] [address]   SARS CORONAVIRUS 2 (TAT 6-24 HRS) Nasopharyngeal Nasopharyngeal Swab     Status: None   Collection Time: 10/24/19  9:01 AM   Specimen: Nasopharyngeal Swab  Result Value Ref Range Status   SARS Coronavirus 2 NEGATIVE NEGATIVE Final    Comment: (NOTE) SARS-CoV-2 target nucleic acids are NOT DETECTED. The SARS-CoV-2 RNA is generally detectable in upper and lower respiratory specimens during the acute phase of infection. Negative results do not preclude SARS-CoV-2 infection, do not rule out co-infections with other pathogens, and should not be used as the sole basis for treatment or other  patient management  decisions. Negative results must be combined with clinical observations, patient history, and epidemiological information. The expected result is Negative. Fact Sheet for Patients: SugarRoll.be Fact Sheet for Healthcare Providers: https://www.woods-mathews.com/ This test is not yet approved or cleared by the [place] FDA and  has been authorized for detection and/or diagnosis of SARS-CoV-2 by FDA under an Emergency Use Authorization (EUA). This EUA will remain  in effect (meaning this test can be used) for the duration of the COVID-19 declaration under Section 56 4(b)(1) of the Act, 21 U.S.C. section 360bbb-3(b)(1), unless the authorization is terminated or revoked sooner. Performed at [facility], [place] [address], [place], [place] [address]      Time coordinating discharge: Over 30 minutes  SIGNED:   Eric J [place] DO [facility] 10/24/2019, 1:28 PM

## 2019-10-24 NOTE — Progress Notes (Signed)
Report given to Katie at [facility].

## 2019-10-24 NOTE — Progress Notes (Signed)
Physical Therapy Treatment Patient Details Name: [name] [name] MRN: [id number] DOB: September 29, 1936 Today's Date: 10/24/2019    History of Present Illness Pt is an 83 y/o with dementia here with generalized weakness possible UTI.  Exam significant for signs of dehydration.  Patient has mild hyperkalemia and is received some IV fluids.  PMH positive for CVA, CAD, PAF, HTN, CHF, anxiety/depression.    PT Comments    Pt making steady progress with functional mobility. She remains limited overall secondary to generalized weakness and continue to recommend pt d/c to SNF for further intensive therapy services prior to returning home. Pt would continue to benefit from skilled physical therapy services at this time while admitted and after d/c to address the below listed limitations in order to improve overall safety and independence with functional mobility.    Follow Up Recommendations  SNF     Equipment Recommendations  None recommended by PT    Recommendations for Other Services       Precautions / Restrictions Precautions Precautions: Fall Restrictions Weight Bearing Restrictions: No    Mobility  Bed Mobility Overal bed mobility: Needs Assistance Bed Mobility: Supine to Sit     Supine to sit: Min assist     General bed mobility comments: min A for trunk elevation to achieve upright sitting at EOB  Transfers Overall transfer level: Needs assistance Equipment used: Rolling walker (2 wheeled) Transfers: Sit to/from Stand Sit to Stand: Min guard         General transfer comment: cueing for safe hand placement and technique, min guard for safety  Ambulation/Gait Ambulation/Gait assistance: Min guard Gait Distance (Feet): 40 Feet Assistive device: Rolling walker (2 wheeled) Gait Pattern/deviations: Step-through pattern;Decreased step length - right;Decreased step length - left;Decreased stride length;Shuffle Gait velocity: decreased   General Gait Details: pt with slow,  cautious gait with mild instability overall requiring close min guard for safety; pt requiring cueing for proximity to RW and for safety   Stairs             Wheelchair Mobility    Modified Rankin (Stroke Patients Only)       Balance Overall balance assessment: Needs assistance Sitting-balance support: Feet supported Sitting balance-Leahy Scale: Fair     Standing balance support: Bilateral upper extremity supported;During functional activity Standing balance-Leahy Scale: Poor                              Cognition Arousal/Alertness: Awake/alert Behavior During Therapy: WFL for tasks assessed/performed Overall Cognitive Status: History of cognitive impairments - at baseline Area of Impairment: Following commands;Safety/judgement;Problem solving                       Following Commands: Follows one step commands with increased time;Follows multi-step commands with increased time Safety/Judgement: Decreased awareness of safety   Problem Solving: Slow processing;Decreased initiation;Requires verbal cues;Difficulty sequencing        Exercises      General Comments        Pertinent Vitals/Pain Pain Assessment: No/denies pain    Home Living                      Prior Function            PT Goals (current goals can now be found in the care plan section) Acute Rehab PT Goals PT Goal Formulation: With patient Time For Goal Achievement: 11/01/19 Potential to  Achieve Goals: Good Progress towards PT goals: Progressing toward goals    Frequency    Min 3X/week      PT Plan Current plan remains appropriate    Co-evaluation              AM-PAC PT "6 Clicks" Mobility   Outcome Measure  Help needed turning from your back to your side while in a flat bed without using bedrails?: A Little Help needed moving from lying on your back to sitting on the side of a flat bed without using bedrails?: A Little Help needed moving  to and from a bed to a chair (including a wheelchair)?: A Little Help needed standing up from a chair using your arms (e.g., wheelchair or bedside chair)?: A Little Help needed to walk in hospital room?: A Little Help needed climbing 3-5 steps with a railing? : A Lot 6 Click Score: 17    End of Session Equipment Utilized During Treatment: Gait belt Activity Tolerance: Patient tolerated treatment well Patient left: in bed;with call bell/phone within reach;Other (comment)(sitting EOB with nurse tech present to assess vitals) Nurse Communication: Mobility status PT Visit Diagnosis: Other abnormalities of gait and mobility (R26.89);Muscle weakness (generalized) (M62.81)     Time: [id number] PT Time Calculation (min) (ACUTE ONLY): 13 min  Charges:  $Gait Training: 8-22 mins                     Anastasio Champion, DPT  Acute Rehabilitation Services Pager [phone] Office [place] 10/24/2019, 4:28 PM

## 2019-10-24 NOTE — Social Work (Signed)
Clinical Social Worker facilitated patient discharge including contacting patient family and facility to confirm patient discharge plans.  Clinical information faxed to facility and family agreeable with plan.  CSW arranged ambulance transport via [facility] to [facility] RN to call [phone]  with report prior to discharge.  Clinical Social Worker will sign off for now as social work intervention is no longer needed. Please consult us again if new need arises.  Traye Bates, MSW, LCSWA Clinical Social Worker [phone]  

## 2019-10-25 ENCOUNTER — Other Ambulatory Visit: Payer: Self-pay | Admitting: *Deleted

## 2019-10-25 NOTE — Patient Outreach (Signed)
Member assessed for potential [facility] Care Management needs as a benefit of  [place] Medicare.  Member is currently receiving rehab therapy at  [facility] SNF.   Member discussed in weekly telephonic IDT meeting with facility staff, [facility] UM team, and writer.  Facility reports member just admitted to facility last night.   However, member is from home with daughter.   Will follow for disposition plans, progression, and for potential [facility] Care Management needs.    Marthenia Rolling, MSN-Ed, RN,BSN [place] Acute Care Coordinator [phone] [facility]) [phone]  (Toll free office)

## 2019-11-01 ENCOUNTER — Other Ambulatory Visit: Payer: Self-pay | Admitting: *Deleted

## 2019-11-01 NOTE — Patient Outreach (Signed)
Member assessed for potential [facility] Care Management needs as a benefit of  [place] Medicare.  Member is currently receiving skilled therapy at [facility].  Member discussed in weekly telephonic IDT meeting with facility staff, [facility] UM team, and writer.  Facility reports member will discharge home with hhc. States daughter will arrange for 24/7 supervision. States member remains deconditioned and continues to work with therapy.   Will continue to follow for disposition plans progression and for potential [facility] Care Management needs.   Marthenia Rolling, MSN-Ed, RN,BSN [place] Acute Care Coordinator [phone] [facility]) [phone]  (Toll free office)

## 2019-11-06 ENCOUNTER — Other Ambulatory Visit: Payer: Self-pay | Admitting: *Deleted

## 2019-11-06 NOTE — Patient Outreach (Signed)
Member assessed for potential [facility] Care Management needs as a benefit of [place] Medicare.  Telephone call made to [facility] daughter [name] at both numbers listed. No answer. Left HIPAA compliant voicemail message requesting call back.  Will continue to follow for disposition plans, progression, and for potential [facility] Care Management needs.   Will plan outreach again at later time to discuss [place] Management.  Marthenia Rolling, MSN-Ed, RN,BSN [place] Acute Care Coordinator [phone] [facility]) [phone]  (Toll free office)

## 2019-11-07 ENCOUNTER — Other Ambulatory Visit: Payer: Self-pay | Admitting: *Deleted

## 2019-11-07 NOTE — Patient Outreach (Signed)
Member assessed for potential [facility] Care Management needs as a benefit of  [place] Medicare.  Member is currently receiving skilled therapy at [facility] SNF.  Member discussed in weekly telephonic IDT meeting with facility staff, [facility] UM team, and writer.  Facility reports tentative dc date is for early next week with home health. Facility reports member lives with her daughter [name]. However, another daughter is supposed to assist as well. [name] (daughter) is primary contact. Facility also endorses that [name] (daughter) has her health concerns herself. States [name] is sometimes hard to reach. Discussed that Probation officer has outreached to [place] and [facility]. Will await return call.  Will plan outreach to daughter again to discuss [place] Management follow up for post SNF dc.   Marthenia Rolling, MSN-Ed, RN,BSN [place] Acute Care Coordinator [phone] [facility]) [phone]  (Toll free office)

## 2019-11-15 ENCOUNTER — Other Ambulatory Visit: Payer: Self-pay | Admitting: *Deleted

## 2019-11-15 ENCOUNTER — Encounter: Payer: Self-pay | Admitting: *Deleted

## 2019-11-15 DIAGNOSIS — I10 Essential (primary) hypertension: Secondary | ICD-10-CM

## 2019-11-15 NOTE — Patient Outreach (Signed)
[facility]) Care Management  11/15/2019  [name] [name] [birth year] [id number]   Referral received from [facility] coordinator as member was discharged yesterday from SNF.  She was admitted to hospital for generalized weakness and required rehab.  Per chart, she also has history of hypertension, CHF, A-fib, GERD, IBS, hypothyroidism, dementia and CKD.  Notified that contact person was daughter [name] [name].  Call placed to [name] [name], member's identity verified.  This care manager introduced self and stated purpose of call, [facility] services explained.  She report member has gotten stronger since going into the hospital and to rehab.  She has now lived with member since the death of member's husband 12/13/18.  Daughter report concern for being slightly overwhelmed as she was diagnosed with a stroke the same day the member was discharged from SNF.  Report she is still able to care for member and has the help of other family, friends, and neighbors.  Daughter report she has been contacted by [name] [name] to start services, will begin within the next couple days.    Member does not have follow up appointment with PCP yet, this care manager offered to call to schedule however daughter declines.  State she is waiting for a couple appointments of her own to be scheduled and wouldn't want a scheduling conflict.  She is aware that the appointment will need to be made as soon as possible.  Once it is scheduled she will provide transportation.    Denies any urgent concerns at this time, will follow up within the next week.  Fall Risk  11/15/2019 04/24/2019 09/23/2016  Falls in the past year? 1 0 Yes  Number falls in past yr: 1 - 1  Injury with Fall? 0 - Yes  Comment - - pt fell and bruise her eyes   Depression screen [facility] 2/9 04/24/2019  Decreased Interest 1  Down, Depressed, Hopeless 1  PHQ - 2 Score 2   THN CM Care Plan Problem One     Most Recent Value  Care Plan Problem One  Risk for readmission related to  physical decline as evidenced by recent hospitalization requiring SNF stay  Role Documenting the Problem One  Care Management [name] [name] for Problem One  Active  [facility] Long Term Goal   Member will not be readmitted to hospital within the next 31 days  THN Long Term Goal Start Date  11/15/19  Interventions for Problem One Long Term Goal  Discharge instructions reviewed with daughter. Educated on importance of following plan of care (involvement with home health, well balanced diet, and support in the home) and decreasing risk of readmission  THN CM Short Term Goal #1   Member will have follow up appointment with PCP within the next 2 weeks  THN CM Short Term Goal #1 Start Date  11/15/19  Interventions for Short Term Goal #1  Daughter educated on importance of quick follow up appointment in effort to decrease risk of readmission  THN CM Short Term Goal #2   Daughter will report member as compliant with medications over the next 3 weeks  THN CM Short Term Goal #2 Start Date  11/15/19  Interventions for Short Term Goal #2  Reviewed medications with daughter but referral placed to [facility] pharmacist due to uncertainty     [name] David, RN, MSN [place] Manager [phone]

## 2019-11-15 NOTE — Patient Outreach (Signed)
Member screened for potential [facility] Care Management needs as a benefit of [place] Medicare.  Verified in Patient [name] [name] that [name] [name] discharged home on 11/13/19. She lives with her daughter [name] [name].  Will make referral to [place] for post discharge follow up. Writer attempted to reach daughter previously without success.   [name] [name] has a medical history of CKD stage 3, paroxysmal atrial fib, HTN, CHF, dementia.   Marthenia Rolling, MSN-Ed, RN,BSN [place] Acute Care Coordinator [phone] [facility]) [phone]  (Toll free office)

## 2019-11-20 ENCOUNTER — Telehealth: Payer: Self-pay | Admitting: Pharmacist

## 2019-11-20 NOTE — Patient Outreach (Addendum)
[facility]) Care Management  [place]   11/20/2019  [name] [name] [birth year] [id number]  Reason for referral: Medication Review  Referral source: [facility] RN Current insurance: Traditional Medicare  PMHx includes but not limited to:   Arthritis, back pain, anxiety, carotid stenosis, CVD, CHF, CKD stage III, depression, dementia, history of falls, GERD, hypertension, hypothyroidism, and history of seizures. Patient was recently hospitalized for mental status changes.   Outreach:  Successful telephone call with patient.  HIPAA identifiers verified.   Subjective:  Spoke with patient's daughter, [name] [name]. She is helping to manage her mother's medications but said she is recovering from a stroke and wanted to review her mother's medications post discharge just to be sure.  Does the patient ever forget to take medication?  yes Does the patient have problems obtaining medications due to transportation?   no Does the patient have problems obtaining medications due to cost?  no  Does the patient feel that medications prescribed are effective?  yes Does the patient ever experience any side effects to the medications prescribed?  yes   Objective: The ASCVD Risk score Mikey Bussing DC Jr., et al., 2013) failed to calculate for the following reasons:   The 2013 ASCVD risk score is only valid for ages 62 to 38  Lab Results  Component Value Date   CREATININE 1.56 (H) 10/21/2019   CREATININE 1.23 (H) 10/19/2019   CREATININE 1.10 (H) 10/17/2019    Lab Results  Component Value Date   HGBA1C 5.4 06/26/2017    Lipid Panel     Component Value Date/Time   CHOL 204 (H) 11/11/2016 0202   TRIG 117 11/11/2016 0202   HDL 61 11/11/2016 0202   CHOLHDL 3.3 11/11/2016 0202   VLDL 23 11/11/2016 0202   LDLCALC 120 (H) 11/11/2016 0202    BP Readings from Last 3 Encounters:  10/24/19 (!) 188/69  06/06/19 (!) 182/70  05/29/19 (!) 137/57    Allergies  Allergen Reactions  .  Tetanus Toxoids Swelling and Other (See Comments)    Arm (site) became swollen as a child  . Snake Antivenin [Antivenin Crotalidae Polyvalent] Other (See Comments)    Just can't take per daughter  . Yellow Dyes (Non-Tartrazine) Itching and Other (See Comments)    Makes patient nervous     Medications Reviewed Today    Reviewed by Elayne Guerin, [facility] (Pharmacist) on 11/20/19 at [place] List Status: <None>  Medication Order Taking? Sig Documenting Provider Last Dose Status Informant  acetaminophen (TYLENOL) 500 MG tablet [id number] Yes Take 1 tablet (500 mg total) by mouth every 6 (six) hours as needed for mild pain or headache (pain). [place] Eric J, DO Taking Active   aspirin EC 81 MG tablet [id number] Yes Take 1 tablet (81 mg total) by mouth every morning. [place] Donnamarie Poag, DO Taking Active   chlordiazePOXIDE (LIBRIUM) 5 MG capsule [id number] Yes Take 1 capsule (5 mg total) by mouth every morning. [place] Donnamarie Poag, DO Taking Active   FLUoxetine (PROZAC) 20 MG capsule [id number] Yes Take 1 capsule (20 mg total) by mouth daily. [place] Donnamarie Poag, DO Taking Active   folic acid (FOLVITE) 1 MG tablet [id number] Yes Take 1 tablet (1 mg total) by mouth daily. [place] Donnamarie Poag, DO Taking Active   furosemide (LASIX) 20 MG tablet [id number] Yes Take 1 tablet (20 mg total) by mouth daily. [place] Eric J, DO Taking Active   hydrALAZINE (APRESOLINE) 100 MG tablet [id number] Yes Take 1 tablet (100  mg total) by mouth 2 (two) times daily. [place] Donnamarie Poag, DO Taking Active   hyoscyamine (LEVBID) 0.375 MG 12 hr tablet [id number] Yes Take 1 tablet (0.375 mg total) by mouth 2 (two) times daily. [place] Donnamarie Poag, DO Taking Active   levothyroxine (SYNTHROID) 100 MCG tablet [id number] Yes Take 1 tablet (100 mcg total) by mouth daily at 6 (six) AM. [place] Donnamarie Poag, DO Taking Active   metoprolol succinate (TOPROL-XL) 25 MG 24 hr tablet [id number] Yes Take 3 tablets (75 mg total) by mouth daily. [place] Donnamarie Poag, DO Taking Active    QUEtiapine (SEROQUEL) 100 MG tablet [id number] Yes Take 2 tablets (200 mg total) by mouth at bedtime. [place] Donnamarie Poag, DO Taking Active   simvastatin (ZOCOR) 20 MG tablet [id number] Yes Take 1 tablet (20 mg total) by mouth at bedtime. [place] Eric J, DO Taking Active   thiamine 100 MG tablet [id number] Yes Take 1 tablet (100 mg total) by mouth daily. [place] Donnamarie Poag, DO Taking Active   venlafaxine XR (EFFEXOR-XR) 37.5 MG 24 hr capsule [id number] Yes Take 1 capsule (37.5 mg total) by mouth daily with breakfast. [place] Donnamarie Poag, DO Taking Active   vitamin B-12 1000 MCG tablet [id number] Yes Take 1 tablet (1,000 mcg total) by mouth daily. [place] Donnamarie Poag, DO Taking Active   zolpidem (AMBIEN) 5 MG tablet [id number] Yes Take 1 tablet (5 mg total) by mouth at bedtime as needed for sleep. [place] Eric J, DO Taking Active           Assessment: Drugs sorted by system:  Neurologic/Psychologic: Chlordiazepoxide, Fluoxetine, Quetiapine, Venlafaxine, Zolpidem  Cardiovascular:  Aspirin, Furosemide, Hydralazine, Simvastatin, Metoprolol,   Gastrointestinal: Hyoscyamine  Endocrine: Levothyroxine  Pain: Acetaminophen,   Vitamins/Minerals/Supplements: Folic Acid, Thiamine, Vitamin B 12,   Medication Review Findings:  . Patient's daughter did not have any questions about her mother's medications. (Reviewed medications with daughter via telephone including indication for each medication).  . She also did not report any issues with medication affordability. (All patient's medications are generic).  Plan: . Will follow up with patient/daughter in 2-3 weeks. . Consider closing case at that time.  Elayne Guerin, PharmD, [place] Clinical Pharmacist [phone]

## 2019-11-22 ENCOUNTER — Other Ambulatory Visit: Payer: Self-pay | Admitting: *Deleted

## 2019-11-22 NOTE — Patient Outreach (Signed)
[facility]) Care Management  11/22/2019  DEILYN RAGATZ 1936-05-08 [id number]   Weekly transition of care call placed to [facility] caregiver/daughter [name].  She report member is slightly better but sleeping a lot.  State she feel member would progress faster once she increases her activity with PT.  Report she complains of pain in her legs, uses walker for ambulation and report independence with ADL's.  She had 2 visits scheduled in the last week but [name] has canceled them due to scheduling issues with her own health problems and concern for physical presentation of member (had to do laundry, didn't have any clean clothes).  State she will contact [facility] as soon as possible to schedule home visit.    Daughter still has not scheduled appointment with member's PCP, state she has had multiple MD appointments for herself this week but she will call to schedule.  State she has had support from some friends/neighbors, denies having any other family that are able to come for increased support.  Report they are doing the best they can.  Inquired again about speaking with social worker for meal support and/or assistance with ADL's/housework.  She denies the need.  Denies any urgent concerns at this time, will follow up within the next week.  THN CM Care Plan Problem One     Most Recent Value  Care Plan Problem One  Risk for readmission related to physical decline as evidenced by recent hospitalization requiring SNF stay  Role Documenting the Problem One  Care Management Door for Problem One  Active  [facility] Long Term Goal   Member will not be readmitted to hospital within the next 31 days  THN Long Term Goal Start Date  11/15/19  Interventions for Problem One Long Term Goal  Educated on importance of following plan of care, including home health & PT.  Provided daughter with contact information for [facility], advised to call as soon as possible.  THN CM Short Term Goal  #1   Member will have follow up appointment with PCP within the next 2 weeks  THN CM Short Term Goal #1 Start Date  11/15/19  Interventions for Short Term Goal #1  Re-educated on importance of follow up appointments and management of chronic medical conditions in effort to decrease readmission  THN CM Short Term Goal #2   Daughter will report member as compliant with medications over the next 3 weeks  THN CM Short Term Goal #2 Start Date  11/15/19  Interventions for Short Term Goal #2  Confirmed daughter has been in contact with pharmacist, encouraged to reach out with questions.  Confirmed she has contact information.     [name], [place], MSN [place] [phone]

## 2019-11-27 DIAGNOSIS — Z6830 Body mass index (BMI) 30.0-30.9, adult: Secondary | ICD-10-CM | POA: Diagnosis not present

## 2019-11-27 DIAGNOSIS — Z9181 History of falling: Secondary | ICD-10-CM | POA: Diagnosis not present

## 2019-11-27 DIAGNOSIS — I130 Hypertensive heart and chronic kidney disease with heart failure and stage 1 through stage 4 chronic kidney disease, or unspecified chronic kidney disease: Secondary | ICD-10-CM | POA: Diagnosis not present

## 2019-11-27 DIAGNOSIS — I5032 Chronic diastolic (congestive) heart failure: Secondary | ICD-10-CM | POA: Diagnosis not present

## 2019-11-27 DIAGNOSIS — Z993 Dependence on wheelchair: Secondary | ICD-10-CM | POA: Diagnosis not present

## 2019-11-27 DIAGNOSIS — I480 Paroxysmal atrial fibrillation: Secondary | ICD-10-CM | POA: Diagnosis not present

## 2019-11-27 DIAGNOSIS — Z7982 Long term (current) use of aspirin: Secondary | ICD-10-CM | POA: Diagnosis not present

## 2019-11-27 DIAGNOSIS — Z981 Arthrodesis status: Secondary | ICD-10-CM | POA: Diagnosis not present

## 2019-11-27 DIAGNOSIS — Z8673 Personal history of transient ischemic attack (TIA), and cerebral infarction without residual deficits: Secondary | ICD-10-CM | POA: Diagnosis not present

## 2019-11-27 DIAGNOSIS — G4700 Insomnia, unspecified: Secondary | ICD-10-CM | POA: Diagnosis not present

## 2019-11-27 DIAGNOSIS — I700 Atherosclerosis of aorta: Secondary | ICD-10-CM | POA: Diagnosis not present

## 2019-11-27 DIAGNOSIS — F329 Major depressive disorder, single episode, unspecified: Secondary | ICD-10-CM | POA: Diagnosis not present

## 2019-11-27 DIAGNOSIS — E039 Hypothyroidism, unspecified: Secondary | ICD-10-CM | POA: Diagnosis not present

## 2019-11-27 DIAGNOSIS — E669 Obesity, unspecified: Secondary | ICD-10-CM | POA: Diagnosis not present

## 2019-11-27 DIAGNOSIS — F0390 Unspecified dementia without behavioral disturbance: Secondary | ICD-10-CM | POA: Diagnosis not present

## 2019-11-27 DIAGNOSIS — D696 Thrombocytopenia, unspecified: Secondary | ICD-10-CM | POA: Diagnosis not present

## 2019-11-27 DIAGNOSIS — N1832 Chronic kidney disease, stage 3b: Secondary | ICD-10-CM | POA: Diagnosis not present

## 2019-11-28 DIAGNOSIS — F0390 Unspecified dementia without behavioral disturbance: Secondary | ICD-10-CM | POA: Diagnosis not present

## 2019-11-28 DIAGNOSIS — N1832 Chronic kidney disease, stage 3b: Secondary | ICD-10-CM | POA: Diagnosis not present

## 2019-11-28 DIAGNOSIS — I700 Atherosclerosis of aorta: Secondary | ICD-10-CM | POA: Diagnosis not present

## 2019-11-28 DIAGNOSIS — E039 Hypothyroidism, unspecified: Secondary | ICD-10-CM | POA: Diagnosis not present

## 2019-11-28 DIAGNOSIS — I130 Hypertensive heart and chronic kidney disease with heart failure and stage 1 through stage 4 chronic kidney disease, or unspecified chronic kidney disease: Secondary | ICD-10-CM | POA: Diagnosis not present

## 2019-11-28 DIAGNOSIS — I5032 Chronic diastolic (congestive) heart failure: Secondary | ICD-10-CM | POA: Diagnosis not present

## 2019-11-29 ENCOUNTER — Other Ambulatory Visit: Payer: Self-pay | Admitting: *Deleted

## 2019-11-29 NOTE — Patient Outreach (Signed)
[facility]) Care Management  11/29/2019  [name] [name] 18-Jul-1936 [id number]   Weekly transition of care call placed to [facility] daughter/caregiver [place].  State she does not have much time to talk as she is preparing for a doctor's visit.  She does report member is improving, confirms that she has been able to start therapy sessions with OT & PT, however she has not scheduled appointment with PCP at this time.  Verbalizes understanding of importance that member is seen by the MD, denies needing assistance with scheduling.  Denies any urgent concerns at this time, will follow up within the next week.  [facility] CM Care Plan Problem One     Most Recent Value  Care Plan Problem One  Risk for readmission related to physical decline as evidenced by recent hospitalization requiring SNF stay  Role Documenting the Problem One  Care Management [place] for Problem One  Active  THN Long Term Goal   Member will not be readmitted to hospital within the next 31 days  THN Long Term Goal Start Date  11/15/19  Interventions for Problem One Long Term Goal  Confirmed member has started with home health, educated daughter on importance of active participation with therapy and performing exercises independently in effort to build strength and decrease risk of admission  THN CM Short Term Goal #1   Member will have follow up appointment with PCP within the next 2 weeks  THN CM Short Term Goal #1 Start Date  11/15/19  Interventions for Short Term Goal #1  Advised daughter of importance of follow up with PCP in effort to assist with management of chronic medical conditions.  Advised to call ASAP to schedule appointment  [facility] CM Short Term Goal #2   Daughter will report member as compliant with medications over the next 3 weeks  THN CM Short Term Goal #2 Start Date  11/15/19  [facility] CM Short Term Goal #2 Met Date  11/29/19     [name], RN, MSN [place]  Manager [phone]

## 2019-11-30 DIAGNOSIS — N1832 Chronic kidney disease, stage 3b: Secondary | ICD-10-CM | POA: Diagnosis not present

## 2019-11-30 DIAGNOSIS — I700 Atherosclerosis of aorta: Secondary | ICD-10-CM | POA: Diagnosis not present

## 2019-11-30 DIAGNOSIS — F0390 Unspecified dementia without behavioral disturbance: Secondary | ICD-10-CM | POA: Diagnosis not present

## 2019-11-30 DIAGNOSIS — I130 Hypertensive heart and chronic kidney disease with heart failure and stage 1 through stage 4 chronic kidney disease, or unspecified chronic kidney disease: Secondary | ICD-10-CM | POA: Diagnosis not present

## 2019-11-30 DIAGNOSIS — E039 Hypothyroidism, unspecified: Secondary | ICD-10-CM | POA: Diagnosis not present

## 2019-11-30 DIAGNOSIS — I5032 Chronic diastolic (congestive) heart failure: Secondary | ICD-10-CM | POA: Diagnosis not present

## 2019-12-03 DIAGNOSIS — I130 Hypertensive heart and chronic kidney disease with heart failure and stage 1 through stage 4 chronic kidney disease, or unspecified chronic kidney disease: Secondary | ICD-10-CM | POA: Diagnosis not present

## 2019-12-03 DIAGNOSIS — I700 Atherosclerosis of aorta: Secondary | ICD-10-CM | POA: Diagnosis not present

## 2019-12-03 DIAGNOSIS — F0390 Unspecified dementia without behavioral disturbance: Secondary | ICD-10-CM | POA: Diagnosis not present

## 2019-12-03 DIAGNOSIS — N1832 Chronic kidney disease, stage 3b: Secondary | ICD-10-CM | POA: Diagnosis not present

## 2019-12-03 DIAGNOSIS — E039 Hypothyroidism, unspecified: Secondary | ICD-10-CM | POA: Diagnosis not present

## 2019-12-03 DIAGNOSIS — I5032 Chronic diastolic (congestive) heart failure: Secondary | ICD-10-CM | POA: Diagnosis not present

## 2019-12-05 ENCOUNTER — Other Ambulatory Visit: Payer: Self-pay | Admitting: *Deleted

## 2019-12-05 DIAGNOSIS — I700 Atherosclerosis of aorta: Secondary | ICD-10-CM | POA: Diagnosis not present

## 2019-12-05 DIAGNOSIS — I130 Hypertensive heart and chronic kidney disease with heart failure and stage 1 through stage 4 chronic kidney disease, or unspecified chronic kidney disease: Secondary | ICD-10-CM | POA: Diagnosis not present

## 2019-12-05 DIAGNOSIS — F0390 Unspecified dementia without behavioral disturbance: Secondary | ICD-10-CM | POA: Diagnosis not present

## 2019-12-05 DIAGNOSIS — N1832 Chronic kidney disease, stage 3b: Secondary | ICD-10-CM | POA: Diagnosis not present

## 2019-12-05 DIAGNOSIS — E039 Hypothyroidism, unspecified: Secondary | ICD-10-CM | POA: Diagnosis not present

## 2019-12-05 DIAGNOSIS — I5032 Chronic diastolic (congestive) heart failure: Secondary | ICD-10-CM | POA: Diagnosis not present

## 2019-12-05 NOTE — Patient Outreach (Signed)
[place] [facility]) Care Management  12/05/2019  [name] [name] 05-03-1936 [id number]   Weekly transition of care call placed to [facility] caregiver/daughter.  She report member is doing better however had a fall a few days ago.  She hit her head and had a lump, state PT notified PCP office regarding fall.  Member still has not had a follow up appointment with PCP since being released from rehab.  Daughter agrees to have this care manager contact MD office for appointment stating she herself is improving and will now be able to focus on member better.  She does not want an appointment until after Jan 1.  Denies any urgent concerns at this time, will follow up within the next week.  THN CM Care Plan Problem One     Most Recent Value  Care Plan Problem One  Risk for readmission related to physical decline as evidenced by recent hospitalization requiring SNF stay  Role Documenting the Problem One  Care Management [place] for Problem One  Active  THN Long Term Goal   Member will not be readmitted to hospital within the next 31 days  THN Long Term Goal Start Date  11/15/19  Interventions for Problem One Long Term Goal  Daughter re-educated on interventions to decrease risk of readmission (managing chronic conditions through PCP follow up and involvement with home health)  [facility] CM Short Term Goal #1   Member will have follow up appointment with PCP within the next 2 weeks  THN CM Short Term Goal #1 Start Date  11/15/19  Interventions for Short Term Goal #1  Call placed to PCP office in effort to schedule appointment, message left.  THN CM Short Term Goal #2   Daughter will report member as compliant with medications over the next 3 weeks  THN CM Short Term Goal #2 Start Date  11/15/19  [facility] CM Short Term Goal #2 Met Date  11/29/19     [name], RN, MSN [place] Manager [phone]

## 2019-12-13 ENCOUNTER — Other Ambulatory Visit: Payer: Self-pay | Admitting: *Deleted

## 2019-12-13 NOTE — Patient Outreach (Signed)
[facility]) Care Management  12/13/2019  [name] [name] [birth year] [id number]   Weekly transition of care call placed to [facility] caregiver/daughter, she state this is not a good time to talk as there is a nurse coming into the home to visit member.  This care manager requested call back when she is available, if no call back will follow up within the next 3-4 business days.   Update:  Call placed back to daughter, she report member is doing well however state she does not feel she is making progress with home PT.  Would like to have a more intense program.  She is interested in having member participate in outpatient rehab and will discuss with PCP during visit next week.  State member's legs are strong once she stand but is needing more core work to be able to sit up.  Otherwise daughter report member is "good."  Will follow up within the next week.  THN CM Care Plan Problem One     Most Recent Value  Care Plan Problem One  Risk for readmission related to physical decline as evidenced by recent hospitalization requiring SNF stay  Role Documenting the Problem One  Care Management [place] for Problem One  Active  THN Long Term Goal   Member will not be readmitted to hospital within the next 31 days  THN Long Term Goal Start Date  11/15/19  Interventions for Problem One Long Term Goal  Reviewed importance of medication reconcilaition, advised to take medications to MD appointment for review  [facility] CM Short Term Goal #1   Member will have follow up appointment with PCP within the next 2 weeks  THN CM Short Term Goal #1 Start Date  11/15/19  Interventions for Short Term Goal #1  Upcoming appointment reviewed with daughter, assessed the need for transportation assistance.  THN CM Short Term Goal #2   Daughter will report member as compliant with medications over the next 3 weeks  THN CM Short Term Goal #2 Start Date  11/15/19  [facility] CM Short Term Goal #2 Met Date   11/29/19       [name], RN, MSN [place] Manager [phone]

## 2019-12-20 ENCOUNTER — Ambulatory Visit: Payer: Self-pay | Admitting: Pharmacist

## 2019-12-20 ENCOUNTER — Other Ambulatory Visit: Payer: Self-pay | Admitting: *Deleted

## 2019-12-20 ENCOUNTER — Other Ambulatory Visit: Payer: Self-pay | Admitting: Pharmacist

## 2019-12-20 NOTE — Patient Outreach (Signed)
[place] [facility]) Care Management  12/20/2019  [name] [name] [birth year] [id number]   Patient was called regarding medication management. Unfortunately, she did not answer her phone. The phone rang >30 times without a voicemail pick up.  Plan: Call patient back in 1-2 weeks.  Elayne Guerin, PharmD, [place] Clinical Pharmacist [phone]

## 2019-12-20 NOTE — Patient Outreach (Signed)
[name] [name]) Care Management  12/20/2019  [name] [name] October 16, 1936 [id number]   Weekly transition of care call placed to member's daughter.  State she is doing well however has gained weight, denies any shortness of breath or swelling.  Had PCP appointment for this week but state member did not have clean clothes to wear and visit rescheduled for 1/11.  Daughter verbalizes understanding regarding importance of follow up appointments and managing chronic health conditions.  Denies any urgent concerns, will follow up within the next 2 weeks.  THN CM Care Plan Problem One     Most Recent Value  Care Plan Problem One  Risk for readmission related to physical decline as evidenced by recent hospitalization requiring SNF stay  Role Documenting the Problem One  Care Management [place] for Problem One  Active  THN Long Term Goal   Member will not be readmitted to [name] within the next 31 days  THN Long Term Goal Start Date  11/15/19  [name] [name] Long Term Goal Met Date  12/20/19  [name] [name] CM Short Term Goal #1   Member will have follow up appointment with PCP within the next 2 weeks  THN CM Short Term Goal #1 Start Date  11/15/19  Interventions for Short Term Goal #1  Call placed to MD office to scheudle appointment, daughter advised of importance of keeping appointment and not rescheduling  THN CM Short Term Goal #2   Daughter will report member as compliant with medications over the next 3 weeks  THN CM Short Term Goal #2 Start Date  11/15/19  [name] CM Short Term Goal #2 Met Date  11/29/19     [name] David, RN, MSN [place] Manager [phone]

## 2019-12-21 ENCOUNTER — Ambulatory Visit: Payer: Self-pay | Admitting: *Deleted

## 2019-12-27 ENCOUNTER — Other Ambulatory Visit: Payer: Self-pay | Admitting: Pharmacist

## 2019-12-27 NOTE — Patient Outreach (Signed)
[name]) Care Management  12/27/2019  [name] [name] 09-Dec-1936 [id number]   Called patient and spoke with her daughter, [name] [name]. HIPAA identifiers were obtained.  [name] [name] reported her mother was doing well and did not have any medication concerns.   Patient is an 84 year old female with multiple medical conditions including but not limited to:  arthritis, back pain, anxiety, carotid stenosis, CVD, CHF, CKD stage III, depression, dementia, history of falls, GERD, hypertension, hypothyroidism, and history of seizures. Patient was recently hospitalized for mental status changes.   [name] [name] is her mother's primary care giver. She reported her mother saw Dr. Delfina Redwood (her PCP) earlier this week and did not have any medication changes.  Medications Reviewed Today    Reviewed by Elayne Guerin, [facility] (Pharmacist) on 12/27/19 at [place] List Status: <None>  Medication Order Taking? Sig Documenting Provider Last Dose Status Informant  acetaminophen (TYLENOL) 500 MG tablet [id number] Yes Take 1 tablet (500 mg total) by mouth every 6 (six) hours as needed for mild pain or headache (pain). [place] Eric J, DO Taking Active   aspirin EC 81 MG tablet [id number] Yes Take 1 tablet (81 mg total) by mouth every morning. [place] Donnamarie Poag, DO Taking Active   chlordiazePOXIDE (LIBRIUM) 5 MG capsule [id number] Yes Take 1 capsule (5 mg total) by mouth every morning. [place] Donnamarie Poag, DO Taking Active   FLUoxetine (PROZAC) 20 MG capsule [id number] Yes Take 1 capsule (20 mg total) by mouth daily. [place] Donnamarie Poag, DO Taking Active   folic acid (FOLVITE) 1 MG tablet [id number] Yes Take 1 tablet (1 mg total) by mouth daily. [place] Donnamarie Poag, DO Taking Active   furosemide (LASIX) 20 MG tablet [id number] Yes Take 1 tablet (20 mg total) by mouth daily. [place] Donnamarie Poag, DO Taking Active   hydrALAZINE (APRESOLINE) 100 MG tablet [id number] Yes Take 1 tablet (100 mg total) by mouth 2 (two) times daily. [place]  Donnamarie Poag, DO Taking Active   hyoscyamine (LEVBID) 0.375 MG 12 hr tablet [id number] Yes Take 1 tablet (0.375 mg total) by mouth 2 (two) times daily. [place] Donnamarie Poag, DO Taking Active   levothyroxine (SYNTHROID) 100 MCG tablet [id number] Yes Take 1 tablet (100 mcg total) by mouth daily at 6 (six) AM. [place] Donnamarie Poag, DO Taking Active   metoprolol succinate (TOPROL-XL) 25 MG 24 hr tablet [id number] Yes Take 3 tablets (75 mg total) by mouth daily. [place] Donnamarie Poag, DO Taking Active   QUEtiapine (SEROQUEL) 100 MG tablet [id number]  Take 2 tablets (200 mg total) by mouth at bedtime. [place] Eric J, DO  Expired 11/23/19 2359   simvastatin (ZOCOR) 20 MG tablet [id number] Yes Take 1 tablet (20 mg total) by mouth at bedtime. [place] Donnamarie Poag, DO Taking Active   venlafaxine XR (EFFEXOR-XR) 37.5 MG 24 hr capsule [id number] Yes Take 1 capsule (37.5 mg total) by mouth daily with breakfast. [place] Donnamarie Poag, DO Taking Active   vitamin B-12 1000 MCG tablet [id number] Yes Take 1 tablet (1,000 mcg total) by mouth daily. [place] Donnamarie Poag, DO Taking Active   zolpidem (AMBIEN) 5 MG tablet [id number] Yes Take 1 tablet (5 mg total) by mouth at bedtime as needed for sleep. [place] Eric J, DO Taking Active          Plan: Close patient's case as she does not have any immediate pharmacy needs. Patient's daughter communicated understanding about Pharmacy case closure. She was given my contact information to reach out to me if  the patient had any future pharmacy needs.  Elayne Guerin, PharmD, [place] Clinical Pharmacist [phone]

## 2020-01-04 ENCOUNTER — Other Ambulatory Visit: Payer: Self-pay | Admitting: *Deleted

## 2020-01-04 NOTE — Patient Outreach (Signed)
[facility]) Care Management  01/04/2020  [name] [name] May 22, 1936 [id number]   Call placed to member's daughter [name] to follow up on member's condition. State this is not a good time to talk, request made for daughter to call back when available.  If no call back, will follow up within the next 3-4 business days.  [name], [place], MSN [place] [phone]

## 2020-01-10 ENCOUNTER — Other Ambulatory Visit: Payer: Self-pay | Admitting: *Deleted

## 2020-01-10 NOTE — Patient Outreach (Signed)
[place] [facility]) Care Management  01/10/2020  TAKEYSHA BONK 1935/12/26 [id number]   Outreach attempt #2, successful.  Call placed to member's daughter to follow up on member's current condition. She report member is "doing real good."  State her PT sessions are now complete but felt she could still benefit from some type of therapy.  Confirms she did have appointment with PCP on 1/18, she inquired about outpatient PT at that time.  State it was decided that member wouldn't go due to risk of exposure to Covid.  Report she has a family friend that has been coming in the home several days a week to help with exercises that were recommended by PT.  Next PCP appointment scheduled for April.  She has not been consistent with monitoring member's weight or blood pressure daily, verbalizes understanding of importance of management of care.  Denies any urgent concerns, will follow up within the next month.  THN CM Care Plan Problem One     Most Recent Value  Care Plan Problem One  Risk for readmission related to physical decline as evidenced by recent hospitalization requiring SNF stay  Role Documenting the Problem One  Care Management [place] for Problem One  Not Active  THN CM Short Term Goal #1   Member will have follow up appointment with PCP within the next 2 weeks  THN CM Short Term Goal #1 Start Date  11/15/19  [facility] CM Short Term Goal #1 Met Date  01/10/20    [facility] CM Care Plan Problem Two     Most Recent Value  Care Plan Problem Two  Knowledge deficit regarding management of chronic medical conditions  Role Documenting the Problem Two  Care Management [place]  Care Plan for Problem Two  Active  Interventions for Problem Two Long Term Goal   Discussed management of chronic medical conditions with daughter (HTN & CHF).  Educated on the need of management of chronic medical conditions in effort to decrease risk of hospitalization  THN Long Term Goal  Daughter will  not report any complications of chronic medical condition within the next 45 days  THN Long Term Goal Start Date  01/10/20  [facility] CM Short Term Goal #1   Daughter will report member has increased strength and ability to perform more duties in the home within the next 4 weeks  THN CM Short Term Goal #1 Start Date  01/10/20  Interventions for Short Term Goal #2   Discussed working with member on exercises to increase strength as PT sessions have completed  THN CM Short Term Goal #2   Member will report monitoring member's blood pressure and weights daily over the next 4 weeks  THN CM Short Term Goal #2 Start Date  01/10/20  Interventions for Short Term Goal #2  Daughter educated on importance of daily monitoring in effort to effectively manage conditions and detect early changes in condition     [name], Therapist, sports, MSN [place]  Manager [phone]

## 2020-02-07 ENCOUNTER — Other Ambulatory Visit: Payer: Self-pay | Admitting: *Deleted

## 2020-02-07 NOTE — Patient Outreach (Signed)
[place] [facility]) Care Management  02/07/2020  ADDISON ABBONDANZA 11-14-1936 [id number]   Call placed to member's caregiver/daughter to follow up on management of chronic medical conditions, no answer.  HIPAA compliant voice message left, will follow up within the next 3-4 business days.  [name], [place], MSN [place] [phone]

## 2020-02-12 ENCOUNTER — Other Ambulatory Visit: Payer: Self-pay | Admitting: *Deleted

## 2020-02-12 NOTE — Patient Outreach (Signed)
[place] [facility]) Care Management  02/12/2020  [name] [name] 01-Oct-1936 [id number]   Outreach attempt #2, unsuccessful.  Call placed to member's daughter to follow up on management of chronic medical conditions, no answer. HIPAA compliant voice message left, will send unsuccessful outreach letter and follow up within the next 3-4 business days.  [name], [place], MSN [place] [phone]

## 2020-02-15 ENCOUNTER — Other Ambulatory Visit: Payer: Self-pay | Admitting: *Deleted

## 2020-02-15 NOTE — Patient Outreach (Signed)
[name] [name]) Care Management  02/15/2020  [name] [name] 1935-12-19 [id number]   Outreach attempt #3, unsuccessful.  Call placed to member's daughter to follow up on management of chronic medical conditions, no answer. HIPAA compliant voice message left.  If no call back by 3/11, will close due to inability to maintain contact.  [name], [place], MSN [place] [phone]

## 2020-02-21 ENCOUNTER — Other Ambulatory Visit: Payer: Self-pay | Admitting: *Deleted

## 2020-02-21 NOTE — Patient Outreach (Signed)
[place] [facility]) Care Management  02/21/2020  [name] [name] 1936/06/14 [id number]    No response from member after multiple unsuccessful outreach attempts and letter sent.  Will close case at this time due to inability to maintain contact.  Will notify member and primary MD of case closure.  [name], [place], MSN [place] [phone]

## 2020-03-02 ENCOUNTER — Observation Stay (HOSPITAL_COMMUNITY): Payer: Medicare Other

## 2020-03-02 ENCOUNTER — Emergency Department (HOSPITAL_COMMUNITY): Payer: Medicare Other

## 2020-03-02 ENCOUNTER — Encounter (HOSPITAL_COMMUNITY): Payer: Self-pay | Admitting: Internal Medicine

## 2020-03-02 ENCOUNTER — Inpatient Hospital Stay (HOSPITAL_COMMUNITY)
Admit: 2020-03-02 | Discharge: 2020-03-13 | DRG: 871 | Disposition: E | Payer: Medicare Other | Attending: Internal Medicine | Admitting: Internal Medicine

## 2020-03-02 DIAGNOSIS — K7200 Acute and subacute hepatic failure without coma: Secondary | ICD-10-CM | POA: Diagnosis not present

## 2020-03-02 DIAGNOSIS — Z8619 Personal history of other infectious and parasitic diseases: Secondary | ICD-10-CM

## 2020-03-02 DIAGNOSIS — G319 Degenerative disease of nervous system, unspecified: Secondary | ICD-10-CM

## 2020-03-02 DIAGNOSIS — R700 Elevated erythrocyte sedimentation rate: Secondary | ICD-10-CM | POA: Diagnosis present

## 2020-03-02 DIAGNOSIS — L4050 Arthropathic psoriasis, unspecified: Secondary | ICD-10-CM | POA: Diagnosis present

## 2020-03-02 DIAGNOSIS — R1110 Vomiting, unspecified: Secondary | ICD-10-CM | POA: Diagnosis present

## 2020-03-02 DIAGNOSIS — F0390 Unspecified dementia without behavioral disturbance: Secondary | ICD-10-CM | POA: Diagnosis present

## 2020-03-02 DIAGNOSIS — Z9842 Cataract extraction status, left eye: Secondary | ICD-10-CM

## 2020-03-02 DIAGNOSIS — I10 Essential (primary) hypertension: Secondary | ICD-10-CM | POA: Diagnosis not present

## 2020-03-02 DIAGNOSIS — J9601 Acute respiratory failure with hypoxia: Secondary | ICD-10-CM | POA: Diagnosis present

## 2020-03-02 DIAGNOSIS — Z90711 Acquired absence of uterus with remaining cervical stump: Secondary | ICD-10-CM

## 2020-03-02 DIAGNOSIS — E039 Hypothyroidism, unspecified: Secondary | ICD-10-CM | POA: Diagnosis present

## 2020-03-02 DIAGNOSIS — R7982 Elevated C-reactive protein (CRP): Secondary | ICD-10-CM | POA: Diagnosis present

## 2020-03-02 DIAGNOSIS — D638 Anemia in other chronic diseases classified elsewhere: Secondary | ICD-10-CM | POA: Diagnosis present

## 2020-03-02 DIAGNOSIS — R400 Somnolence: Secondary | ICD-10-CM | POA: Diagnosis present

## 2020-03-02 DIAGNOSIS — R739 Hyperglycemia, unspecified: Secondary | ICD-10-CM | POA: Diagnosis present

## 2020-03-02 DIAGNOSIS — Z801 Family history of malignant neoplasm of trachea, bronchus and lung: Secondary | ICD-10-CM

## 2020-03-02 DIAGNOSIS — I480 Paroxysmal atrial fibrillation: Secondary | ICD-10-CM | POA: Diagnosis present

## 2020-03-02 DIAGNOSIS — F32A Depression, unspecified: Secondary | ICD-10-CM | POA: Diagnosis present

## 2020-03-02 DIAGNOSIS — Z96652 Presence of left artificial knee joint: Secondary | ICD-10-CM | POA: Diagnosis present

## 2020-03-02 DIAGNOSIS — Z818 Family history of other mental and behavioral disorders: Secondary | ICD-10-CM

## 2020-03-02 DIAGNOSIS — I5032 Chronic diastolic (congestive) heart failure: Secondary | ICD-10-CM | POA: Diagnosis present

## 2020-03-02 DIAGNOSIS — G4700 Insomnia, unspecified: Secondary | ICD-10-CM | POA: Diagnosis present

## 2020-03-02 DIAGNOSIS — J690 Pneumonitis due to inhalation of food and vomit: Secondary | ICD-10-CM | POA: Diagnosis not present

## 2020-03-02 DIAGNOSIS — I4891 Unspecified atrial fibrillation: Secondary | ICD-10-CM

## 2020-03-02 DIAGNOSIS — J811 Chronic pulmonary edema: Secondary | ICD-10-CM | POA: Diagnosis present

## 2020-03-02 DIAGNOSIS — Z9049 Acquired absence of other specified parts of digestive tract: Secondary | ICD-10-CM

## 2020-03-02 DIAGNOSIS — Z91048 Other nonmedicinal substance allergy status: Secondary | ICD-10-CM

## 2020-03-02 DIAGNOSIS — R0600 Dyspnea, unspecified: Secondary | ICD-10-CM

## 2020-03-02 DIAGNOSIS — Z8719 Personal history of other diseases of the digestive system: Secondary | ICD-10-CM

## 2020-03-02 DIAGNOSIS — R34 Anuria and oliguria: Secondary | ICD-10-CM

## 2020-03-02 DIAGNOSIS — F329 Major depressive disorder, single episode, unspecified: Secondary | ICD-10-CM | POA: Diagnosis present

## 2020-03-02 DIAGNOSIS — Z6833 Body mass index (BMI) 33.0-33.9, adult: Secondary | ICD-10-CM

## 2020-03-02 DIAGNOSIS — Z20822 Contact with and (suspected) exposure to covid-19: Secondary | ICD-10-CM | POA: Diagnosis not present

## 2020-03-02 DIAGNOSIS — H9190 Unspecified hearing loss, unspecified ear: Secondary | ICD-10-CM | POA: Diagnosis present

## 2020-03-02 DIAGNOSIS — N170 Acute kidney failure with tubular necrosis: Secondary | ICD-10-CM | POA: Diagnosis not present

## 2020-03-02 DIAGNOSIS — R404 Transient alteration of awareness: Secondary | ICD-10-CM | POA: Diagnosis not present

## 2020-03-02 DIAGNOSIS — F411 Generalized anxiety disorder: Secondary | ICD-10-CM | POA: Diagnosis present

## 2020-03-02 DIAGNOSIS — Z79899 Other long term (current) drug therapy: Secondary | ICD-10-CM

## 2020-03-02 DIAGNOSIS — N1832 Chronic kidney disease, stage 3b: Secondary | ICD-10-CM | POA: Diagnosis present

## 2020-03-02 DIAGNOSIS — T68XXXA Hypothermia, initial encounter: Secondary | ICD-10-CM

## 2020-03-02 DIAGNOSIS — R6510 Systemic inflammatory response syndrome (SIRS) of non-infectious origin without acute organ dysfunction: Secondary | ICD-10-CM | POA: Diagnosis not present

## 2020-03-02 DIAGNOSIS — F13180 Sedative, hypnotic or anxiolytic abuse with sedative, hypnotic or anxiolytic-induced anxiety disorder: Secondary | ICD-10-CM | POA: Diagnosis present

## 2020-03-02 DIAGNOSIS — A419 Sepsis, unspecified organism: Secondary | ICD-10-CM | POA: Diagnosis not present

## 2020-03-02 DIAGNOSIS — Z515 Encounter for palliative care: Secondary | ICD-10-CM | POA: Diagnosis not present

## 2020-03-02 DIAGNOSIS — I739 Peripheral vascular disease, unspecified: Secondary | ICD-10-CM | POA: Diagnosis present

## 2020-03-02 DIAGNOSIS — F1010 Alcohol abuse, uncomplicated: Secondary | ICD-10-CM | POA: Diagnosis present

## 2020-03-02 DIAGNOSIS — R627 Adult failure to thrive: Secondary | ICD-10-CM | POA: Diagnosis not present

## 2020-03-02 DIAGNOSIS — K589 Irritable bowel syndrome without diarrhea: Secondary | ICD-10-CM | POA: Diagnosis present

## 2020-03-02 DIAGNOSIS — Z9841 Cataract extraction status, right eye: Secondary | ICD-10-CM

## 2020-03-02 DIAGNOSIS — R4182 Altered mental status, unspecified: Secondary | ICD-10-CM | POA: Diagnosis present

## 2020-03-02 DIAGNOSIS — E860 Dehydration: Secondary | ICD-10-CM | POA: Diagnosis present

## 2020-03-02 DIAGNOSIS — D631 Anemia in chronic kidney disease: Secondary | ICD-10-CM | POA: Diagnosis present

## 2020-03-02 DIAGNOSIS — N183 Chronic kidney disease, stage 3 unspecified: Secondary | ICD-10-CM | POA: Diagnosis present

## 2020-03-02 DIAGNOSIS — E875 Hyperkalemia: Secondary | ICD-10-CM | POA: Diagnosis present

## 2020-03-02 DIAGNOSIS — Z887 Allergy status to serum and vaccine status: Secondary | ICD-10-CM

## 2020-03-02 DIAGNOSIS — E8809 Other disorders of plasma-protein metabolism, not elsewhere classified: Secondary | ICD-10-CM | POA: Diagnosis present

## 2020-03-02 DIAGNOSIS — Z9089 Acquired absence of other organs: Secondary | ICD-10-CM

## 2020-03-02 DIAGNOSIS — N179 Acute kidney failure, unspecified: Secondary | ICD-10-CM | POA: Diagnosis present

## 2020-03-02 DIAGNOSIS — E872 Acidosis: Secondary | ICD-10-CM | POA: Diagnosis not present

## 2020-03-02 DIAGNOSIS — I679 Cerebrovascular disease, unspecified: Secondary | ICD-10-CM | POA: Diagnosis present

## 2020-03-02 DIAGNOSIS — I482 Chronic atrial fibrillation, unspecified: Secondary | ICD-10-CM | POA: Diagnosis present

## 2020-03-02 DIAGNOSIS — R0602 Shortness of breath: Secondary | ICD-10-CM

## 2020-03-02 DIAGNOSIS — D696 Thrombocytopenia, unspecified: Secondary | ICD-10-CM | POA: Diagnosis present

## 2020-03-02 DIAGNOSIS — E870 Hyperosmolality and hypernatremia: Secondary | ICD-10-CM

## 2020-03-02 DIAGNOSIS — Z96612 Presence of left artificial shoulder joint: Secondary | ICD-10-CM | POA: Diagnosis present

## 2020-03-02 DIAGNOSIS — R112 Nausea with vomiting, unspecified: Secondary | ICD-10-CM

## 2020-03-02 DIAGNOSIS — Z9181 History of falling: Secondary | ICD-10-CM

## 2020-03-02 DIAGNOSIS — I130 Hypertensive heart and chronic kidney disease with heart failure and stage 1 through stage 4 chronic kidney disease, or unspecified chronic kidney disease: Secondary | ICD-10-CM | POA: Diagnosis present

## 2020-03-02 DIAGNOSIS — Z8249 Family history of ischemic heart disease and other diseases of the circulatory system: Secondary | ICD-10-CM

## 2020-03-02 DIAGNOSIS — Z888 Allergy status to other drugs, medicaments and biological substances status: Secondary | ICD-10-CM

## 2020-03-02 DIAGNOSIS — G9341 Metabolic encephalopathy: Secondary | ICD-10-CM | POA: Diagnosis present

## 2020-03-02 DIAGNOSIS — R0682 Tachypnea, not elsewhere classified: Secondary | ICD-10-CM

## 2020-03-02 DIAGNOSIS — R410 Disorientation, unspecified: Secondary | ICD-10-CM | POA: Diagnosis not present

## 2020-03-02 DIAGNOSIS — Z79890 Hormone replacement therapy (postmenopausal): Secondary | ICD-10-CM

## 2020-03-02 DIAGNOSIS — Z66 Do not resuscitate: Secondary | ICD-10-CM | POA: Diagnosis not present

## 2020-03-02 DIAGNOSIS — Z7982 Long term (current) use of aspirin: Secondary | ICD-10-CM

## 2020-03-02 DIAGNOSIS — E669 Obesity, unspecified: Secondary | ICD-10-CM | POA: Diagnosis present

## 2020-03-02 DIAGNOSIS — R531 Weakness: Secondary | ICD-10-CM

## 2020-03-02 DIAGNOSIS — R809 Proteinuria, unspecified: Secondary | ICD-10-CM | POA: Diagnosis present

## 2020-03-02 LAB — COMPREHENSIVE METABOLIC PANEL
ALT: 39 U/L (ref 0–44)
AST: 50 U/L — ABNORMAL HIGH (ref 15–41)
Albumin: 3.2 g/dL — ABNORMAL LOW (ref 3.5–5.0)
Alkaline Phosphatase: 125 U/L (ref 38–126)
Anion gap: 19 — ABNORMAL HIGH (ref 5–15)
BUN: 57 mg/dL — ABNORMAL HIGH (ref 8–23)
CO2: 18 mmol/L — ABNORMAL LOW (ref 22–32)
Calcium: 8.6 mg/dL — ABNORMAL LOW (ref 8.9–10.3)
Chloride: 100 mmol/L (ref 98–111)
Creatinine, Ser: 1.38 mg/dL — ABNORMAL HIGH (ref 0.44–1.00)
GFR calc Af Amer: 41 mL/min — ABNORMAL LOW (ref >60)
GFR calc non Af Amer: 36 mL/min — ABNORMAL LOW (ref >60)
Glucose, Bld: 115 mg/dL — ABNORMAL HIGH (ref 70–99)
Potassium: 4.7 mmol/L (ref 3.5–5.1)
Sodium: 137 mmol/L (ref 135–145)
Total Bilirubin: 1.3 mg/dL — ABNORMAL HIGH (ref 0.3–1.2)
Total Protein: 6.9 g/dL (ref 6.5–8.1)

## 2020-03-02 LAB — URINALYSIS, ROUTINE W REFLEX MICROSCOPIC
Bacteria, UA: NONE SEEN
Bilirubin Urine: NEGATIVE
Glucose, UA: 50 mg/dL — AB
Ketones, ur: 5 mg/dL — AB
Leukocytes,Ua: NEGATIVE
Nitrite: NEGATIVE
Protein, ur: 100 mg/dL — AB
Specific Gravity, Urine: 1.011 (ref 1.005–1.030)
pH: 5 (ref 5.0–8.0)

## 2020-03-02 LAB — CBC WITH DIFFERENTIAL/PLATELET
Abs Immature Granulocytes: 0.04 10*3/uL (ref 0.00–0.07)
Basophils Absolute: 0 10*3/uL (ref 0.0–0.1)
Basophils Relative: 0 %
Eosinophils Absolute: 0 10*3/uL (ref 0.0–0.5)
Eosinophils Relative: 0 %
HCT: 39.2 % (ref 36.0–46.0)
Hemoglobin: 12.8 g/dL (ref 12.0–15.0)
Immature Granulocytes: 0 %
Lymphocytes Relative: 2 %
Lymphs Abs: 0.2 10*3/uL — ABNORMAL LOW (ref 0.7–4.0)
MCH: 30.3 pg (ref 26.0–34.0)
MCHC: 32.7 g/dL (ref 30.0–36.0)
MCV: 92.7 fL (ref 80.0–100.0)
Monocytes Absolute: 0.3 10*3/uL (ref 0.1–1.0)
Monocytes Relative: 3 %
Neutro Abs: 10.1 10*3/uL — ABNORMAL HIGH (ref 1.7–7.7)
Neutrophils Relative %: 95 %
Platelets: 66 10*3/uL — ABNORMAL LOW (ref 150–400)
RBC: 4.23 MIL/uL (ref 3.87–5.11)
RDW: 15 % (ref 11.5–15.5)
WBC: 10.7 10*3/uL — ABNORMAL HIGH (ref 4.0–10.5)
nRBC: 0.2 % (ref 0.0–0.2)

## 2020-03-02 LAB — RAPID URINE DRUG SCREEN, HOSP PERFORMED
Amphetamines: NOT DETECTED
Barbiturates: NOT DETECTED
Benzodiazepines: POSITIVE — AB
Cocaine: NOT DETECTED
Opiates: NOT DETECTED
Tetrahydrocannabinol: NOT DETECTED

## 2020-03-02 LAB — PROTIME-INR
INR: 1 (ref 0.8–1.2)
Prothrombin Time: 13.4 seconds (ref 11.4–15.2)

## 2020-03-02 LAB — SARS CORONAVIRUS 2 (TAT 6-24 HRS): SARS Coronavirus 2: NEGATIVE

## 2020-03-02 LAB — LACTIC ACID, PLASMA
Lactic Acid, Venous: 1.4 mmol/L (ref 0.5–1.9)
Lactic Acid, Venous: 1 mmol/L (ref 0.5–1.9)

## 2020-03-02 LAB — CK: Total CK: 50 U/L (ref 38–234)

## 2020-03-02 LAB — SEDIMENTATION RATE: Sed Rate: 64 mm/hr — ABNORMAL HIGH (ref 0–22)

## 2020-03-02 LAB — PROCALCITONIN: Procalcitonin: <0.1 ng/mL

## 2020-03-02 MED ORDER — HYDRALAZINE HCL 50 MG PO TABS
100.0000 mg | ORAL_TABLET | Freq: Two times a day (BID) | ORAL | Status: DC
  Administered 2020-03-02 – 2020-03-03 (×2): 100 mg via ORAL
  Filled 2020-03-02 (×3): qty 2

## 2020-03-02 MED ORDER — SIMVASTATIN 20 MG PO TABS
20.0000 mg | ORAL_TABLET | Freq: Every day | ORAL | Status: DC
  Administered 2020-03-02: 20 mg via ORAL
  Filled 2020-03-02 (×2): qty 1

## 2020-03-02 MED ORDER — ENOXAPARIN SODIUM 40 MG/0.4ML ~~LOC~~ SOLN
40.0000 mg | SUBCUTANEOUS | Status: DC
  Administered 2020-03-02: 40 mg via SUBCUTANEOUS
  Filled 2020-03-02: qty 0.4

## 2020-03-02 MED ORDER — ZOLPIDEM TARTRATE 5 MG PO TABS: 5.0000 mg | ORAL_TABLET | Freq: Every evening | ORAL | Status: DC | PRN

## 2020-03-02 MED ORDER — METOPROLOL SUCCINATE ER 25 MG PO TB24
75.0000 mg | ORAL_TABLET | Freq: Every day | ORAL | Status: DC
  Administered 2020-03-03: 75 mg via ORAL
  Filled 2020-03-02 (×2): qty 3

## 2020-03-02 MED ORDER — QUETIAPINE FUMARATE 200 MG PO TABS
200.0000 mg | ORAL_TABLET | Freq: Every day | ORAL | Status: DC
  Administered 2020-03-02: 200 mg via ORAL
  Filled 2020-03-02: qty 1
  Filled 2020-03-02: qty 2
  Filled 2020-03-02 (×4): qty 1

## 2020-03-02 MED ORDER — LEVOTHYROXINE SODIUM 100 MCG PO TABS
100.0000 ug | ORAL_TABLET | Freq: Every day | ORAL | Status: DC
  Administered 2020-03-03: 100 ug via ORAL
  Filled 2020-03-02 (×3): qty 1

## 2020-03-02 MED ORDER — FUROSEMIDE 20 MG PO TABS: 20.0000 mg | ORAL_TABLET | Freq: Every day | ORAL | Status: DC

## 2020-03-02 MED ORDER — ACETAMINOPHEN 650 MG RE SUPP: 650.0000 mg | Freq: Four times a day (QID) | RECTAL | Status: DC | PRN

## 2020-03-02 MED ORDER — ONDANSETRON HCL 4 MG/2ML IJ SOLN
4.0000 mg | Freq: Four times a day (QID) | INTRAMUSCULAR | Status: DC | PRN
  Administered 2020-03-03 – 2020-03-05 (×4): 4 mg via INTRAVENOUS
  Filled 2020-03-02 (×5): qty 2

## 2020-03-02 MED ORDER — SODIUM CHLORIDE 0.9 % IV SOLN: Freq: Once | INTRAVENOUS | Status: AC

## 2020-03-02 MED ORDER — SODIUM CHLORIDE 0.9% FLUSH
3.0000 mL | Freq: Two times a day (BID) | INTRAVENOUS | Status: DC
  Administered 2020-03-03 – 2020-03-06 (×5): 3 mL via INTRAVENOUS

## 2020-03-02 MED ORDER — ONDANSETRON HCL 4 MG PO TABS: 4.0000 mg | ORAL_TABLET | Freq: Four times a day (QID) | ORAL | Status: DC | PRN

## 2020-03-02 MED ORDER — HYOSCYAMINE SULFATE ER 0.375 MG PO TB12
0.3750 mg | ORAL_TABLET | Freq: Two times a day (BID) | ORAL | Status: DC
  Administered 2020-03-02 – 2020-03-05 (×3): 0.375 mg via ORAL
  Filled 2020-03-02 (×9): qty 1

## 2020-03-02 MED ORDER — CHLORDIAZEPOXIDE HCL 5 MG PO CAPS
5.0000 mg | ORAL_CAPSULE | Freq: Every morning | ORAL | Status: DC
  Administered 2020-03-03 – 2020-03-05 (×2): 5 mg via ORAL
  Filled 2020-03-02 (×2): qty 1

## 2020-03-02 MED ORDER — ACETAMINOPHEN 325 MG PO TABS: 650.0000 mg | ORAL_TABLET | Freq: Four times a day (QID) | ORAL | Status: DC | PRN

## 2020-03-02 MED ORDER — FLUOXETINE HCL 20 MG PO CAPS
20.0000 mg | ORAL_CAPSULE | Freq: Every day | ORAL | Status: DC
  Administered 2020-03-03 – 2020-03-05 (×2): 20 mg via ORAL
  Filled 2020-03-02 (×2): qty 1

## 2020-03-02 MED ORDER — ASPIRIN EC 81 MG PO TBEC
81.0000 mg | DELAYED_RELEASE_TABLET | Freq: Every morning | ORAL | Status: DC
  Administered 2020-03-03: 81 mg via ORAL
  Filled 2020-03-02 (×2): qty 1

## 2020-03-02 MED ORDER — FOLIC ACID 1 MG PO TABS
1.0000 mg | ORAL_TABLET | Freq: Every day | ORAL | Status: DC
  Administered 2020-03-03: 1 mg via ORAL
  Filled 2020-03-02 (×2): qty 1

## 2020-03-02 MED ORDER — VITAMIN B-12 1000 MCG PO TABS
1000.0000 ug | ORAL_TABLET | Freq: Every day | ORAL | Status: DC
  Administered 2020-03-03: 1000 ug via ORAL
  Filled 2020-03-02 (×2): qty 1

## 2020-03-02 MED ORDER — VENLAFAXINE HCL ER 37.5 MG PO CP24
37.5000 mg | ORAL_CAPSULE | Freq: Every day | ORAL | Status: DC
  Administered 2020-03-03 – 2020-03-05 (×2): 37.5 mg via ORAL
  Filled 2020-03-02 (×4): qty 1

## 2020-03-02 MED ORDER — SODIUM CHLORIDE 0.9 % IV BOLUS
1000.0000 mL | Freq: Once | INTRAVENOUS | Status: AC
  Administered 2020-03-02: 1000 mL via INTRAVENOUS

## 2020-03-02 NOTE — ED Provider Notes (Signed)
[facility] EMERGENCY DEPARTMENT Provider Note   CSN: [id number] Arrival date & time: 02/26/2020  1248     History No chief complaint on file.   [name] [name] is a 84 y.o. female with a past medical history of CHF, hypertension, hyperlipidemia, GERD presenting to the ED for possible UTI, altered mental status.  Majority of history is provided by EMS.  States that they found patient in her bed, soiled around her with urine, food crumbs caked onto her mouth and dry mucous membranes.  She was found in a sun room that does not have adequate ventilation.  Patient found to be hypothermic by EMS.  States that she lives with her daughter but apparently her daughter has not checked up on her for the past 2 days.  Patient tells me she is not having any pain anywhere, is aware that she is in the hospital and that she lives with her daughter.  HPI     Past Medical History:  Diagnosis Date  . Alopecia   . Anemia    hx of years ago   . Anxiety   . ARF (acute renal failure) ([place]) 08/23/2014  . Arthritis    psoriatic arthritis  . Back pain   . Blood transfusion    at age of 4  . Carotid stenosis 12/2016  . Chronic diastolic CHF (congestive heart failure) ([place])   . Depression   . Dysrhythmia 11/13/2016   PAFib for a short time- Echo done 11/14/16 PAF- to follow up with PCP- to see if she is a candiate for anticoags.  Not started at times time due to alcohol abuse.  [name] GERD (gastroesophageal reflux disease)   . H/O hiatal hernia   . History of acute cholangitis   . History of GI diverticular bleed   . Hyperlipidemia   . Hypertension   . Hypothyroidism   . IBS (irritable bowel syndrome)   . Pancreatitis   . Peripheral vascular disease ([place])   . PONV (postoperative nausea and vomiting)    with Breast reduction- only time  . Rosacea   . Seizures ([place]) 11/13/2016   Thopught to be from withdrawl Benzodiazepine  . Stroke [facility])    patient denies-  . Thyroid disease      Patient Active Problem List   Diagnosis Date Noted  . Falls 10/19/2019  . Generalized weakness 10/18/2019  . Dementia ([place]) 10/18/2019  . CKD (chronic kidney disease), stage III 10/18/2019  . Palliative care by specialist   . Goals of care, counseling/discussion   . Depression   . Metabolic encephalopathy [id number]  . Sepsis ([place]) 03/29/2019  . Bradycardia 03/29/2019  . Somnolence 03/29/2019  . Acute blood loss anemia   . Chronic diastolic congestive heart failure ([place])   . Weakness 03/13/2019  . Hypothermia 03/09/2019  . Elevated liver enzymes 03/09/2019  . Thrombocytopenia ([place]) 03/09/2019  . Hyperkalemia 07/23/2017  . AKI (acute kidney injury) ([place]) 07/23/2017  . 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 Fall 06/26/2017  . Intractable nausea and vomiting 03/11/2017  . Acute lower UTI 03/11/2017  . Carotid stenosis 12/28/2016  . Sedative, hypnotic, or anxiolytic withdrawal delirium 11/15/2016  . Paroxysmal atrial fibrillation ([facility])   . Cerebrovascular disease   . Fever   . Acute encephalopathy 11/10/2016  . Hypothyroidism 11/10/2016  . Hypertensive urgency 11/10/2016  . Altered mental state 11/10/2016  . Seizure ([place]) 11/10/2016  . Chronic diastolic CHF (congestive heart failure) ([place])   .  Left carotid stenosis 10/29/2016  . S/P total knee replacement 03/10/2016  . 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 Ileus ([place]) 09/05/2014  . Vomiting 09/04/2014  . Pulmonary edema 09/04/2014  . Hypokalemia 08/28/2014  . Enteritis due to Clostridium difficile 08/27/2014  . 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 Protein calorie malnutrition ([place]) 08/24/2014  . Obstructive jaundice 08/23/2014  . Acute cholangitis 08/23/2014  . ARF (acute renal failure) ([place]) 08/23/2014  . Coagulopathy ([place]) 08/23/2014  . Back pain   . Arthritis   . Hypertension   . GERD (gastroesophageal reflux disease)   . Anxiety state   . IBS (irritable bowel  syndrome)   . Hernia of Right posterior flank 04/18/2012    Past Surgical History:  Procedure Laterality Date  . ABDOMINAL HYSTERECTOMY     partial  . BACK SURGERY  2010   thoractic-screws and rods  . BILE DUCT STENT PLACEMENT  08/26/2014  . BREAST REDUCTION SURGERY    . CHOLECYSTECTOMY  1988   lap  . COLONOSCOPY    . COLONOSCOPY Left 09/08/2014   Procedure: COLONOSCOPY;  Surgeon: Arta Silence, MD;  Location[facility] ENDOSCOPY;  Service: Endoscopy;  Laterality: Left;  . ENDARTERECTOMY Left 12/28/2016  . ENDARTERECTOMY Left 12/28/2016   Procedure: ENDARTERECTOMY CAROTID LEFT WITH XENOSURE BIOLOGIC PATCH ANGIOPLASTY;  Surgeon: Angelia Mould, MD;  Location: [facility];  Service: Vascular;  Laterality: Left;  . ERCP    . ERCP N/A 08/26/2014   Procedure: ENDOSCOPIC RETROGRADE CHOLANGIOPANCREATOGRAPHY (ERCP);  Surgeon: Jeryl Columbia, MD;  Location: [facility] ENDOSCOPY;  Service: Endoscopy;  Laterality: N/A;  . ERCP N/A 09/11/2014   Procedure: ENDOSCOPIC RETROGRADE CHOLANGIOPANCREATOGRAPHY (ERCP);  Surgeon: Arta Silence, MD;  Location: [facility];  Service: Endoscopy;  Laterality: N/A;  . EUS N/A 09/11/2014   Procedure: ESOPHAGEAL ENDOSCOPIC ULTRASOUND (EUS) RADIAL;  Surgeon: Arta Silence, MD;  Location: [facility] ENDOSCOPY;  Service: Endoscopy;  Laterality: N/A;  . EYE SURGERY Bilateral    Cataract  . JOINT REPLACEMENT  2005   left shoulder replacement   . ORIF CLAVICULAR FRACTURE Left 07/18/2014   Procedure: OPEN REDUCTION INTERNAL FIXATION (ORIF) LEFT CLAVICULAR FRACTURE;  Surgeon: Ninetta Lights, MD;  Location: [facility];  Service: Orthopedics;  Laterality: Left;  . SPYGLASS CHOLANGIOSCOPY N/A 09/11/2014   Procedure: SPYGLASS CHOLANGIOSCOPY;  Surgeon: Arta Silence, MD;  Location: [facility] ENDOSCOPY;  Service: Endoscopy;  Laterality: N/A;  . TONSILLECTOMY  as child  . TOTAL KNEE ARTHROPLASTY Left 03/10/2016   Procedure: LEFT TOTAL KNEE ARTHROPLASTY;  Surgeon: Ninetta Lights, MD;   Location: [facility];  Service: Orthopedics;  Laterality: Left;  [name] VENTRAL HERNIA REPAIR  06/08/2012   Procedure: LAPAROSCOPIC VENTRAL HERNIA;  Surgeon: Adin Hector, MD;  Location: [facility] ORS;  Service: General;  Laterality: Right;     OB History   No obstetric history on file.     Family History  Problem Relation Age of Onset  . Heart disease Mother   . Cancer Father        lung  . Bipolar disorder Brother     Social History   Tobacco Use  . Smoking status: Never Smoker  . Smokeless tobacco: Never Used  Substance Use Topics  . Alcohol use: Yes    Alcohol/week: 7.0 standard drinks    Types: 7 Glasses of wine per week    Comment: rare  . Drug use: No    Types: Benzodiazepines    Home Medications Prior to Admission medications  Medication Sig Start Date End Date Taking? Authorizing Provider  acetaminophen (TYLENOL) 500 MG tablet Take 1 tablet (500 mg total) by mouth every 6 (six) hours as needed for mild pain or headache (pain). 10/24/19   [place] Donnamarie Poag, DO  aspirin EC 81 MG tablet Take 1 tablet (81 mg total) by mouth every morning. 10/24/19   [place] Donnamarie Poag, DO  chlordiazePOXIDE (LIBRIUM) 5 MG capsule Take 1 capsule (5 mg total) by mouth every morning. 10/25/19   [place] Eric J, DO  FLUoxetine (PROZAC) 20 MG capsule Take 1 capsule (20 mg total) by mouth daily. 10/24/19   [place] Donnamarie Poag, DO  folic acid (FOLVITE) 1 MG tablet Take 1 tablet (1 mg total) by mouth daily. 10/24/19   [place] Donnamarie Poag, DO  furosemide (LASIX) 20 MG tablet Take 1 tablet (20 mg total) by mouth daily. 10/24/19   [place] Donnamarie Poag, DO  hydrALAZINE (APRESOLINE) 100 MG tablet Take 1 tablet (100 mg total) by mouth 2 (two) times daily. 10/24/19   [place] Donnamarie Poag, DO  hyoscyamine (LEVBID) 0.375 MG 12 hr tablet Take 1 tablet (0.375 mg total) by mouth 2 (two) times daily. 10/24/19   [place] Donnamarie Poag, DO  levothyroxine (SYNTHROID) 100 MCG tablet Take 1 tablet (100 mcg total) by mouth daily at 6 (six) AM. 10/24/19    [place] Donnamarie Poag, DO  metoprolol succinate (TOPROL-XL) 25 MG 24 hr tablet Take 3 tablets (75 mg total) by mouth daily. 10/25/19   [place] Eric J, DO  QUEtiapine (SEROQUEL) 100 MG tablet Take 2 tablets (200 mg total) by mouth at bedtime. 10/24/19 11/23/19  [place] Donnamarie Poag, DO  simvastatin (ZOCOR) 20 MG tablet Take 1 tablet (20 mg total) by mouth at bedtime. 10/24/19   [place] Donnamarie Poag, DO  venlafaxine XR (EFFEXOR-XR) 37.5 MG 24 hr capsule Take 1 capsule (37.5 mg total) by mouth daily with breakfast. 10/24/19   [place] Donnamarie Poag, DO  vitamin B-12 1000 MCG tablet Take 1 tablet (1,000 mcg total) by mouth daily. 10/25/19   [place] Donnamarie Poag, DO  zolpidem (AMBIEN) 5 MG tablet Take 1 tablet (5 mg total) by mouth at bedtime as needed for sleep. 10/24/19   [place] Donnamarie Poag, DO    Allergies    Tetanus toxoids, Snake antivenin [antivenin crotalidae polyvalent], and Yellow dyes (non-tartrazine)  Review of Systems   Review of Systems  Unable to perform ROS: Mental status change  Cardiovascular: Negative for chest pain.  Gastrointestinal: Negative for abdominal pain.    Physical Exam Updated Vital Signs BP (!) 156/64   Pulse (!) 58   Temp (!) 87.9 F (31.1 C) (Rectal)   Resp 18   SpO2 92%   Physical Exam Vitals and nursing note reviewed.  Constitutional:      General: She is not in acute distress.    Appearance: She is well-developed.     Comments: Hard of hearing.  HENT:     Head: Normocephalic and atraumatic.     Nose: Nose normal.     Mouth/Throat:     Mouth: Mucous membranes are dry.  Eyes:     General: No scleral icterus.       Right eye: No discharge.        Left eye: No discharge.     Conjunctiva/sclera: Conjunctivae normal.     Pupils: Pupils are equal, round, and reactive to light.  Cardiovascular:     Rate and Rhythm: Normal rate and regular rhythm.     Heart  sounds: Normal heart sounds. No murmur. No friction rub. No gallop.   Pulmonary:     Effort: Pulmonary effort is normal.  Tachypnea present. No respiratory distress.     Breath sounds: Normal breath sounds.  Abdominal:     General: Bowel sounds are normal. There is no distension.     Palpations: Abdomen is soft.     Tenderness: There is no abdominal tenderness. There is no guarding.  Musculoskeletal:        General: Normal range of motion.     Cervical back: Normal range of motion and neck supple.     Right lower leg: No edema.     Left lower leg: No edema.  Skin:    General: Skin is warm and dry.     Findings: No rash.     Comments: Mild erythema noted under the right breast without drainage or bleeding.  No pressure ulcers noted.  Neurological:     General: No focal deficit present.     Mental Status: She is alert.     Cranial Nerves: No cranial nerve deficit.     Motor: No weakness or abnormal muscle tone.     Coordination: Coordination normal.     Comments: Moving extremities without difficulty.  Oriented to self, family member and situation, knows she is in [place] in the hospital. Lonna Duval it is the year 2020.  No weakness noted of upper and lower extremities.  Able to follow simple commands.     ED Results / Procedures / Treatments   Labs (all labs ordered are listed, but only abnormal results are displayed) Labs Reviewed  COMPREHENSIVE METABOLIC PANEL - Abnormal; Notable for the following components:      Result Value   CO2 18 (*)    Glucose, Bld 115 (*)    BUN 57 (*)    Creatinine, Ser 1.38 (*)    Calcium 8.6 (*)    Albumin 3.2 (*)    AST 50 (*)    Total Bilirubin 1.3 (*)    GFR calc non Af Amer 36 (*)    GFR calc Af Amer 41 (*)    Anion gap 19 (*)    All other components within normal limits  CBC WITH DIFFERENTIAL/PLATELET - Abnormal; Notable for the following components:   WBC 10.7 (*)    All other components within normal limits  URINALYSIS, ROUTINE W REFLEX MICROSCOPIC - Abnormal; Notable for the following components:   APPearance CLOUDY (*)    Glucose, UA 50 (*)    Hgb  urine dipstick SMALL (*)    Ketones, ur 5 (*)    Protein, ur 100 (*)    All other components within normal limits  CULTURE, BLOOD (ROUTINE X 2)  CULTURE, BLOOD (ROUTINE X 2)  URINE CULTURE  SARS CORONAVIRUS 2 (TAT 6-24 HRS)  LACTIC ACID, PLASMA  PROTIME-INR  CK  LACTIC ACID, PLASMA    EKG None  Radiology DG Chest Portable 1 View  Result Date: 03/04/2020 CLINICAL DATA:  84 year old female with history of altered mental status. Urinary tract infection. EXAM: PORTABLE CHEST 1 VIEW COMPARISON:  Chest x-ray 10/18/2019. FINDINGS: Lung volumes are low. Mild diffuse peribronchial cuffing and areas of interstitial prominence throughout the lungs bilaterally, which may reflect bronchitis. No acute consolidative airspace disease. No definite pleural effusions. No evidence of pulmonary edema. Heart size is normal. Upper mediastinal contours are within normal limits. Aortic atherosclerosis. Status post ORIF in the distal left clavicle. Status post left shoulder arthroplasty.  Orthopedic fixation hardware in the lower thoracic and lumbar spine, incompletely imaged. IMPRESSION: 1. Low lung volumes with findings suggestive of potential bronchitis. Electronically Signed   By: Vinnie Langton M.D.   On: 02/14/2020 15:20    Procedures Procedures (including critical care time)  Medications Ordered in ED Medications  sodium chloride 0.9 % bolus 1,000 mL (has no administration in time range)    ED Course  I have reviewed the triage vital signs and the nursing notes.  Pertinent labs & imaging results that were available during my care of the patient were reviewed by me and considered in my medical decision making (see chart for details).  Clinical Course as of Mar 02 1530  Sun Mar 02, 2020  1522 Rectal temperature improved to 41 F.   [HK]  1529 Creatinine(!): 1.38 [HK]  1529 BUN(!): 57 [HK]  1529 Anion gap(!): 19 [HK]  1529 CK Total: 50 [HK]    Clinical Course User Index [HK] Delia Heady,  PA-C   MDM Rules/Calculators/A&P                      84 year old female with a past medical history of CHF with EF of 60% on echo 2 years ago, hypertension, GERD presenting to the ED for possible UTI, possible altered mental status and hypothermia.  EMS states that they found patient in her bed, soiled around her with urine and dry mucous membranes.  She was found in a syndrome that does not have adequate ventilation.  Found to be hypothermic by EMS, placed with hot packs and heat.  On arrival here patient is alert, oriented to self, situation and self.  She is moving extremities without difficulty.  She has a rectal temperature of 87.9 initially.  However her other vital signs including blood pressure, heart rate and oxygen saturations are well maintained.  Lab work here shows a BUN of 61 whch is elevated than her baseline, creatinine appears similar to priors.  Slight elevation anion gap of 19.  Leukocytosis of 10.7.  Urinalysis without signs of infection.  Blood cultures are obtained.  CK is within normal limits.  Chest x-ray with findings consistent with possible bronchitis.  Patient's rectal temperature improved with bear hugger here, now found to be 92 degrees rectally.  She will need admission for her hypothermia to ensure that she maintains her body temperature adequately.   Final Clinical Impression(s) / ED Diagnoses Final diagnoses:  Hypothermia, initial encounter    Rx / DC Orders ED Discharge Orders    None     Portions of this note were generated with Dragon dictation software. Dictation errors may occur despite best attempts at proofreading.    Delia Heady, PA-C 02/24/2020 1536    Fredia Sorrow, MD 03/03/20 1606

## 2020-03-02 NOTE — Progress Notes (Signed)
Received [name] [name] from ED via stretcher at [employer].  Pt is awake and oriented to person, place and situation.  Follows commands, moex4.  CHG bath given.  Placed on cardiac monitor and registered with CCMD.  Orientation to room given.  Saline lock in the left hand, site unremarkable.  No urine output since placement of purwick in the ED, bladder scan shows 698 cc urine in the bladder.  Page to Dr. Tamala Julian at [phone] , order received for foley catheter, placed.

## 2020-03-02 NOTE — H&P (Addendum)
History and Physical    [name] [name] JFH:[id number] DOB: 20-Feb-1936 DOA: 03/05/2020  Referring MD/NP/PA: Delia Heady, PA-C PCP: [name], MD  Patient coming from: Home with daughter  via EMS  Chief Complaint: Weakness  I have personally briefly reviewed patient's old medical records in [place]   HPI: [name] [name] is a 84 y.o. female with medical history significant of diastolic CHF, paroxysmal atrial fibrillation, CKD, hypothyroidism, and dementia.  History is obtained from review of records and daughter over the phone.  At baseline patient ambulates with the use of a walker and normally sleeps downstairs on the couch.  For the last 2 days she had not been able to get up off of the couch normal.  Her daughter had placed a potty chair beside her, but noted that she was unable to get up and ultimately placed an adult brief on her.  She had not been wanting to eat or drink much despite her daughter trying.  This morning when she awoke the patient was not able to speak.  The patient's daughter appears to manage her medications and had not had any recent changes.  A friend reportedly came over and called EMS.  Patient currently able to talk, but does not report any complaints at this time.  She is unaware of why she is here in the hospital.  Daughter noted similar episode with the patient not being able to walk back in November 2020 where she had to go to collapse for rehab.  During that hospitalization as well it appears she met SIRS criteria, but did not require any antibiotics as no clear source of infection was found.  EMS found the patient in her bed that was in a sun room for insulation soiled with urine, food crumbs caked around her mouth, and with dry mucous membranes.  Her initial temperature was checked and she was noted to be hypothermic.   ED Course: Upon admission into the emergency department patient was noted to be 87.9, pulse 53-58, aspirations 18-22, blood pressure  156/64-188/81, and O2 saturations maintained on room air.  Labs significant for WBC 10.7, BUN 57, creatinine 1.38, CK 50, lactic acid 1.4.  Chest x-ray showed possible signs of bronchitis but was of low lung volumes.  Urinalysis did not show any significant signs of infection.  Patient was given 1 L normal saline IV fluids and placed on a bear hugger.  TRH called to admit.  Review of Systems  Unable to perform ROS: Dementia    Past Medical History:  Diagnosis Date  . Alopecia   . Anemia    hx of years ago   . Anxiety   . ARF (acute renal failure) ([place]) 08/23/2014  . Arthritis    psoriatic arthritis  . Back pain   . Blood transfusion    at age of 4  . Carotid stenosis 12/2016  . Chronic diastolic CHF (congestive heart failure) ([place])   . Depression   . Dysrhythmia 11/13/2016   PAFib for a short time- Echo done 11/14/16 PAF- to follow up with PCP- to see if she is a candiate for anticoags.  Not started at times time due to alcohol abuse.  [name] GERD (gastroesophageal reflux disease)   . H/O hiatal hernia   . History of acute cholangitis   . History of GI diverticular bleed   . Hyperlipidemia   . Hypertension   . Hypothyroidism   . IBS (irritable bowel syndrome)   . Pancreatitis   .  Peripheral vascular disease ([place])   . PONV (postoperative nausea and vomiting)    with Breast reduction- only time  . Rosacea   . Seizures ([place]) 11/13/2016   Thopught to be from withdrawl Benzodiazepine  . Stroke [facility])    patient denies-  . Thyroid disease     Past Surgical History:  Procedure Laterality Date  . ABDOMINAL HYSTERECTOMY     partial  . BACK SURGERY  2010   thoractic-screws and rods  . BILE DUCT STENT PLACEMENT  08/26/2014  . BREAST REDUCTION SURGERY    . CHOLECYSTECTOMY  1988   lap  . COLONOSCOPY    . COLONOSCOPY Left 09/08/2014   Procedure: COLONOSCOPY;  Surgeon: Arta Silence, MD;  Location: [facility] ENDOSCOPY;  Service: Endoscopy;  Laterality: Left;  . ENDARTERECTOMY Left  12/28/2016  . ENDARTERECTOMY Left 12/28/2016   Procedure: ENDARTERECTOMY CAROTID LEFT WITH XENOSURE BIOLOGIC PATCH ANGIOPLASTY;  Surgeon: Angelia Mould, MD;  Location: [place];  Service: Vascular;  Laterality: Left;  . ERCP    . ERCP N/A 08/26/2014   Procedure: ENDOSCOPIC RETROGRADE CHOLANGIOPANCREATOGRAPHY (ERCP);  Surgeon: Jeryl Columbia, MD;  Location: [facility] ENDOSCOPY;  Service: Endoscopy;  Laterality: N/A;  . ERCP N/A 09/11/2014   Procedure: ENDOSCOPIC RETROGRADE CHOLANGIOPANCREATOGRAPHY (ERCP);  Surgeon: Arta Silence, MD;  Location: [facility] ENDOSCOPY;  Service: Endoscopy;  Laterality: N/A;  . EUS N/A 09/11/2014   Procedure: ESOPHAGEAL ENDOSCOPIC ULTRASOUND (EUS) RADIAL;  Surgeon: Arta Silence, MD;  Location: [facility] ENDOSCOPY;  Service: Endoscopy;  Laterality: N/A;  . EYE SURGERY Bilateral    Cataract  . JOINT REPLACEMENT  2005   left shoulder replacement   . ORIF CLAVICULAR FRACTURE Left 07/18/2014   Procedure: OPEN REDUCTION INTERNAL FIXATION (ORIF) LEFT CLAVICULAR FRACTURE;  Surgeon: Ninetta Lights, MD;  Location: [place];  Service: Orthopedics;  Laterality: Left;  . SPYGLASS CHOLANGIOSCOPY N/A 09/11/2014   Procedure: SPYGLASS CHOLANGIOSCOPY;  Surgeon: Arta Silence, MD;  Location: [facility] ENDOSCOPY;  Service: Endoscopy;  Laterality: N/A;  . TONSILLECTOMY  as child  . TOTAL KNEE ARTHROPLASTY Left 03/10/2016   Procedure: LEFT TOTAL KNEE ARTHROPLASTY;  Surgeon: Ninetta Lights, MD;  Location: [place];  Service: Orthopedics;  Laterality: Left;  [name] VENTRAL HERNIA REPAIR  06/08/2012   Procedure: LAPAROSCOPIC VENTRAL HERNIA;  Surgeon: Adin Hector, MD;  Location: [facility] ORS;  Service: General;  Laterality: Right;     reports that she has never smoked. She has never used smokeless tobacco. She reports current alcohol use of about 7.0 standard drinks of alcohol per week. She reports that she does not use drugs.  Allergies  Allergen Reactions  . Tetanus Toxoids Swelling and Other (See  Comments)    Arm (site) became swollen as a child  . Snake Antivenin [Antivenin Crotalidae Polyvalent] Other (See Comments)    Just can't take per daughter  . Yellow Dyes (Non-Tartrazine) Itching and Other (See Comments)    Makes patient nervous     Family History  Problem Relation Age of Onset  . Heart disease Mother   . Cancer Father        lung  . Bipolar disorder Brother     Prior to Admission medications   Medication Sig Start Date End Date Taking? Authorizing Provider  acetaminophen (TYLENOL) 500 MG tablet Take 1 tablet (500 mg total) by mouth every 6 (six) hours as needed for mild pain or headache (pain). 10/24/19   [place] Donnamarie Poag, DO  aspirin EC 81 MG tablet Take 1 tablet (  81 mg total) by mouth every morning. 10/24/19   [place] Donnamarie Poag, DO  chlordiazePOXIDE (LIBRIUM) 5 MG capsule Take 1 capsule (5 mg total) by mouth every morning. 10/25/19   [place] Eric J, DO  FLUoxetine (PROZAC) 20 MG capsule Take 1 capsule (20 mg total) by mouth daily. 10/24/19   [place] Donnamarie Poag, DO  folic acid (FOLVITE) 1 MG tablet Take 1 tablet (1 mg total) by mouth daily. 10/24/19   [place] Donnamarie Poag, DO  furosemide (LASIX) 20 MG tablet Take 1 tablet (20 mg total) by mouth daily. 10/24/19   [place] Donnamarie Poag, DO  hydrALAZINE (APRESOLINE) 100 MG tablet Take 1 tablet (100 mg total) by mouth 2 (two) times daily. 10/24/19   [place] Donnamarie Poag, DO  hyoscyamine (LEVBID) 0.375 MG 12 hr tablet Take 1 tablet (0.375 mg total) by mouth 2 (two) times daily. 10/24/19   [place] Donnamarie Poag, DO  levothyroxine (SYNTHROID) 100 MCG tablet Take 1 tablet (100 mcg total) by mouth daily at 6 (six) AM. 10/24/19   [place] Donnamarie Poag, DO  metoprolol succinate (TOPROL-XL) 25 MG 24 hr tablet Take 3 tablets (75 mg total) by mouth daily. 10/25/19   [place] Eric J, DO  QUEtiapine (SEROQUEL) 100 MG tablet Take 2 tablets (200 mg total) by mouth at bedtime. 10/24/19 11/23/19  [place] Donnamarie Poag, DO  simvastatin (ZOCOR) 20 MG tablet Take 1 tablet (20 mg  total) by mouth at bedtime. 10/24/19   [place] Donnamarie Poag, DO  venlafaxine XR (EFFEXOR-XR) 37.5 MG 24 hr capsule Take 1 capsule (37.5 mg total) by mouth daily with breakfast. 10/24/19   [place] Donnamarie Poag, DO  vitamin B-12 1000 MCG tablet Take 1 tablet (1,000 mcg total) by mouth daily. 10/25/19   [place] Donnamarie Poag, DO  zolpidem (AMBIEN) 5 MG tablet Take 1 tablet (5 mg total) by mouth at bedtime as needed for sleep. 10/24/19   [place] Eric J, DO    Physical Exam:  Constitutional: Elderly in NAD, calm, comfortable Vitals:   02/25/2020 1307 03/09/2020 1357  BP: (!) 188/81 (!) 156/64  Pulse: (!) 53 (!) 58  Resp: (!) 22 18  Temp: (!) 87.9 F (31.1 C)   TempSrc: Rectal   SpO2: 94% 92%   Eyes: PERRL, lids and conjunctivae normal ENMT: Mucous membranes are dry. Posterior pharynx clear of any exudate or lesions.   Neck: normal, supple, no masses, no thyromegaly Respiratory: clear to auscultation bilaterally, no wheezing, no crackles. Normal respiratory effort. No accessory muscle use.  Cardiovascular: Bradycardic, no murmurs / rubs / gallops. No extremity edema. 2+ pedal pulses. No carotid bruits.  Abdomen: no tenderness, no masses palpated. No hepatosplenomegaly. Bowel sounds positive.  Musculoskeletal: no clubbing / cyanosis. No joint deformity upper and lower extremities. Good ROM, no contractures. Normal muscle tone.  Skin: no rashes, lesions, ulcers. No induration Neurologic: CN 2-12 grossly intact.  Patient able to move all extremities Psychiatric: Poor memory.  Patient alert and oriented to person and place, but not time or situation.    Labs on Admission: I have personally reviewed following labs and imaging studies  CBC: Recent Labs  Lab 03/08/2020 1359  WBC 10.7*  NEUTROABS PENDING  HGB 12.8  HCT 39.2  MCV 92.7  PLT PENDING   Basic Metabolic Panel: Recent Labs  Lab 03/05/2020 1359  NA 137  K 4.7  CL 100  CO2 18*  GLUCOSE 115*  BUN 57*  CREATININE 1.38*  CALCIUM 8.6*    GFR: CrCl cannot be calculated (Unknown  ideal weight.). Liver Function Tests: Recent Labs  Lab 02/23/2020 1359  AST 50*  ALT 39  ALKPHOS 125  BILITOT 1.3*  PROT 6.9  ALBUMIN 3.2*   No results for input(s): LIPASE, AMYLASE in the last 168 hours. No results for input(s): AMMONIA in the last 168 hours. Coagulation Profile: Recent Labs  Lab 02/14/2020 1359  INR 1.0   Cardiac Enzymes: Recent Labs  Lab 02/25/2020 1359  CKTOTAL 50   BNP (last 3 results) No results for input(s): PROBNP in the last 8760 hours. HbA1C: No results for input(s): HGBA1C in the last 72 hours. CBG: No results for input(s): GLUCAP in the last 168 hours. Lipid Profile: No results for input(s): CHOL, HDL, LDLCALC, TRIG, CHOLHDL, LDLDIRECT in the last 72 hours. Thyroid Function Tests: No results for input(s): TSH, T4TOTAL, FREET4, T3FREE, THYROIDAB in the last 72 hours. Anemia Panel: No results for input(s): VITAMINB12, FOLATE, FERRITIN, TIBC, IRON, RETICCTPCT in the last 72 hours. Urine analysis:    Component Value Date/Time   COLORURINE YELLOW 02/17/2020 1359   APPEARANCEUR CLOUDY (A) 02/29/2020 1359   LABSPEC 1.011 03/09/2020 1359   PHURINE 5.0 03/01/2020 1359   GLUCOSEU 50 (A) 03/01/2020 1359   HGBUR SMALL (A) 02/15/2020 1359   BILIRUBINUR NEGATIVE 03/04/2020 1359   KETONESUR 5 (A) 02/27/2020 1359   PROTEINUR 100 (A) 02/14/2020 1359   UROBILINOGEN 0.2 09/04/2014 1340   NITRITE NEGATIVE 02/12/2020 1359   LEUKOCYTESUR NEGATIVE 02/13/2020 1359   Sepsis Labs: No results found for this or any previous visit (from the past 240 hour(s)).   Radiological Exams on Admission: DG Chest Portable 1 View  Result Date: 02/19/2020 CLINICAL DATA:  84 year old female with history of altered mental status. Urinary tract infection. EXAM: PORTABLE CHEST 1 VIEW COMPARISON:  Chest x-ray 10/18/2019. FINDINGS: Lung volumes are low. Mild diffuse peribronchial cuffing and areas of interstitial prominence throughout  the lungs bilaterally, which may reflect bronchitis. No acute consolidative airspace disease. No definite pleural effusions. No evidence of pulmonary edema. Heart size is normal. Upper mediastinal contours are within normal limits. Aortic atherosclerosis. Status post ORIF in the distal left clavicle. Status post left shoulder arthroplasty. Orthopedic fixation hardware in the lower thoracic and lumbar spine, incompletely imaged. IMPRESSION: 1. Low lung volumes with findings suggestive of potential bronchitis. Electronically Signed   By: Vinnie Langton M.D.   On: 03/11/2020 15:20    Chest x-ray: Independently reviewed.  Poor qualities with low lung volumes  Assessment/Plan Hypothermia, SIRS: Acute.  Patient's initial rectal temperature was noted to be 87.9 F with WBC elevated at 10.7.  Lactic acid was reassuring at 1.  Chest x-ray noted possible bronchitis, but urinalysis did not show significant signs of infection.  Patient was placed on empiric hugger and blood cultures were obtained. -Admit to a progressive bed -Follow-up blood cultures -Check urine drug screen -Check procalcitonin, ESR, and CRP -Consider starting antibiotics if needed -Social work consulted   Acute metabolic encephalopathy, history of dementia: Daughter reported the patient was not able to walk over the last 2 days and had been not able to speak since this morning.  Patient have symptoms secondary to severe hyperthermia as patient's bed was in like a son room with poor insulation.  Patient currently able to talk after being placed on bear hugger on physical exam at this time, but not able to give any significant details regarding on why she here in the hospital. -Neurochecks -Check CT scan of the brain  Lower extremity weakness: Acute.  Patient reportedly not able to get up and walk with use of a walker as previously done over the last 2 days -PT/OT to treat  Diastolic CHF: Chronic.  Last EF noted to be 60 to 65% without  significant wall motion abnormalities appreciated in 09/2018.  Patient does not appear fluid overloaded at this time. -Strict Intake and out  -Daily weights -Restarting furosemide when medically appropriate  Essential Hypertension: On admission blood pressures elevated up to 188/81.  Home blood pressure medications include hydralazine 100 mg twice daily, furosemide 20 mg daily, and metoprolol 75 mg daily. -Continue hydralazine and metoprolol  Hypothyroidism: Last TSH noted to be 2.658 on 10/18/2019 -Check TSH -Continue levothyroxine  Depression: Chronic. - Continue Prozac  Irritable bowel syndrome: Chronic.  Patient is on hyoscyaminen for treatment. -Continue current regimen  Insomina: At home patient had been taking 10 mg of Ambien and 200 mg of Seroquel nightly - Continue Seroquel and reduce Ambien to 5 mg   IBS - Continue hyocscyamine  DNR present on admission DVT prophylaxis: Lovenox Code Status: DNR Family Communication: Talked to daughter over the phone. Disposition Plan: To be determined Consults called: None Admission status: Observation  Norval Morton MD [facility] Pager [phone]   If 7PM-7AM, please contact night-coverage [email or web address] Password [facility]  02/11/2020, 3:29 PM

## 2020-03-02 NOTE — ED Triage Notes (Signed)
Pt here via [facility] from home where she lives with her daughter.  Per [facility],  A friend of the pt's came over and was distraught to find the pt in the un-insulated sun room, in urine-soaked clothes with a diaper soaked in urine and chucks soaked through.  Per friend, the daughter stated that she had been allowing the pt to drink etoh and take pills b/c "the pt wanted to go be with her husband in [place]".    VS stable, except for temp. Per [facility], they placed hot packs on pt and heated the ambulance en-route.

## 2020-03-02 NOTE — ED Notes (Signed)
Pt placed on bair hugger

## 2020-03-02 NOTE — ED Provider Notes (Signed)
Medical screening examination/treatment/procedure(s) were conducted as a shared visit with non-physician practitioner(s) and myself.  I personally evaluated the patient during the encounter.      Patient seen by me along with the physician assistant.  Patient lives with her daughter.  But has not seen her daughter for hearing for couple days.  She arrived significantly hypothermic her temperature was 87.9 Fahrenheit.  But vital signs otherwise were normal heart rate around 58 respiratory rate 18 blood pressure 156/64 oxygen sats anywhere from 90 to 94%.  Patient without any really particular complaints.  She was found in kind of a sunroom kind of environment where it was a little cool.  Her work-up lab wise very unimpressive a little bit of leukocytosis lactic acid was normal.  CO2 was a little down at 18 but as mentioned lactic acid was normal.  Her BUN a little higher than baseline for her but her GFR's were close to baseline.  No anemia.  CPK was not elevated.  Urinalysis was negative.  And chest x-ray without any significant abnormalities.  Patient is alert and was answer questions" fairly oriented.  Moves all extremities.  No obvious focal findings.  Based on this significant temperature patient started on Bair hugger.  She will need to be admitted to make sure she is able to maintain her her temperatures on her own.  Her primary care doctor is Dr. Delfina Redwood so she would be a [facility] admission.  CRITICAL CARE Performed by: Fredia Sorrow Total critical care time: 30 minutes Critical care time was exclusive of separately billable procedures and treating other patients. Critical care was necessary to treat or prevent imminent or life-threatening deterioration. Critical care was time spent personally by me on the following activities: development of treatment plan with patient and/or surrogate as well as nursing, discussions with consultants, evaluation of patient's response to treatment,  examination of patient, obtaining history from patient or surrogate, ordering and performing treatments and interventions, ordering and review of laboratory studies, ordering and review of radiographic studies, pulse oximetry and re-evaluation of patient's condition.    Fredia Sorrow, MD 03/03/2020 [phone]

## 2020-03-03 ENCOUNTER — Observation Stay (HOSPITAL_COMMUNITY): Payer: Medicare Other

## 2020-03-03 ENCOUNTER — Inpatient Hospital Stay (HOSPITAL_COMMUNITY): Payer: Medicare Other

## 2020-03-03 DIAGNOSIS — N189 Chronic kidney disease, unspecified: Secondary | ICD-10-CM | POA: Diagnosis not present

## 2020-03-03 DIAGNOSIS — G9341 Metabolic encephalopathy: Secondary | ICD-10-CM | POA: Diagnosis present

## 2020-03-03 DIAGNOSIS — E870 Hyperosmolality and hypernatremia: Secondary | ICD-10-CM | POA: Diagnosis not present

## 2020-03-03 DIAGNOSIS — R531 Weakness: Secondary | ICD-10-CM | POA: Diagnosis not present

## 2020-03-03 DIAGNOSIS — I482 Chronic atrial fibrillation, unspecified: Secondary | ICD-10-CM | POA: Diagnosis present

## 2020-03-03 DIAGNOSIS — E039 Hypothyroidism, unspecified: Secondary | ICD-10-CM | POA: Diagnosis not present

## 2020-03-03 DIAGNOSIS — G9340 Encephalopathy, unspecified: Secondary | ICD-10-CM | POA: Diagnosis not present

## 2020-03-03 DIAGNOSIS — R112 Nausea with vomiting, unspecified: Secondary | ICD-10-CM | POA: Diagnosis not present

## 2020-03-03 DIAGNOSIS — K7200 Acute and subacute hepatic failure without coma: Secondary | ICD-10-CM | POA: Diagnosis not present

## 2020-03-03 DIAGNOSIS — I5032 Chronic diastolic (congestive) heart failure: Secondary | ICD-10-CM | POA: Diagnosis present

## 2020-03-03 DIAGNOSIS — I739 Peripheral vascular disease, unspecified: Secondary | ICD-10-CM | POA: Diagnosis present

## 2020-03-03 DIAGNOSIS — N183 Chronic kidney disease, stage 3 unspecified: Secondary | ICD-10-CM | POA: Diagnosis not present

## 2020-03-03 DIAGNOSIS — N179 Acute kidney failure, unspecified: Secondary | ICD-10-CM | POA: Diagnosis not present

## 2020-03-03 DIAGNOSIS — T68XXXA Hypothermia, initial encounter: Secondary | ICD-10-CM | POA: Diagnosis not present

## 2020-03-03 DIAGNOSIS — I361 Nonrheumatic tricuspid (valve) insufficiency: Secondary | ICD-10-CM | POA: Diagnosis not present

## 2020-03-03 DIAGNOSIS — K589 Irritable bowel syndrome without diarrhea: Secondary | ICD-10-CM | POA: Diagnosis not present

## 2020-03-03 DIAGNOSIS — E872 Acidosis: Secondary | ICD-10-CM | POA: Diagnosis not present

## 2020-03-03 DIAGNOSIS — R4182 Altered mental status, unspecified: Secondary | ICD-10-CM | POA: Diagnosis not present

## 2020-03-03 DIAGNOSIS — I130 Hypertensive heart and chronic kidney disease with heart failure and stage 1 through stage 4 chronic kidney disease, or unspecified chronic kidney disease: Secondary | ICD-10-CM | POA: Diagnosis present

## 2020-03-03 DIAGNOSIS — R627 Adult failure to thrive: Secondary | ICD-10-CM | POA: Diagnosis not present

## 2020-03-03 DIAGNOSIS — E875 Hyperkalemia: Secondary | ICD-10-CM | POA: Diagnosis not present

## 2020-03-03 DIAGNOSIS — N281 Cyst of kidney, acquired: Secondary | ICD-10-CM | POA: Diagnosis not present

## 2020-03-03 DIAGNOSIS — R0682 Tachypnea, not elsewhere classified: Secondary | ICD-10-CM | POA: Diagnosis not present

## 2020-03-03 DIAGNOSIS — J690 Pneumonitis due to inhalation of food and vomit: Secondary | ICD-10-CM | POA: Diagnosis not present

## 2020-03-03 DIAGNOSIS — J181 Lobar pneumonia, unspecified organism: Secondary | ICD-10-CM | POA: Diagnosis not present

## 2020-03-03 DIAGNOSIS — Z66 Do not resuscitate: Secondary | ICD-10-CM | POA: Diagnosis present

## 2020-03-03 DIAGNOSIS — Z20822 Contact with and (suspected) exposure to covid-19: Secondary | ICD-10-CM | POA: Diagnosis present

## 2020-03-03 DIAGNOSIS — J9601 Acute respiratory failure with hypoxia: Secondary | ICD-10-CM | POA: Diagnosis not present

## 2020-03-03 DIAGNOSIS — Z515 Encounter for palliative care: Secondary | ICD-10-CM | POA: Diagnosis not present

## 2020-03-03 DIAGNOSIS — F0390 Unspecified dementia without behavioral disturbance: Secondary | ICD-10-CM | POA: Diagnosis present

## 2020-03-03 DIAGNOSIS — I4891 Unspecified atrial fibrillation: Secondary | ICD-10-CM | POA: Diagnosis not present

## 2020-03-03 DIAGNOSIS — I340 Nonrheumatic mitral (valve) insufficiency: Secondary | ICD-10-CM | POA: Diagnosis not present

## 2020-03-03 DIAGNOSIS — N170 Acute kidney failure with tubular necrosis: Secondary | ICD-10-CM | POA: Diagnosis not present

## 2020-03-03 DIAGNOSIS — A419 Sepsis, unspecified organism: Secondary | ICD-10-CM | POA: Diagnosis present

## 2020-03-03 DIAGNOSIS — R0602 Shortness of breath: Secondary | ICD-10-CM | POA: Diagnosis not present

## 2020-03-03 DIAGNOSIS — D631 Anemia in chronic kidney disease: Secondary | ICD-10-CM | POA: Diagnosis present

## 2020-03-03 DIAGNOSIS — E8809 Other disorders of plasma-protein metabolism, not elsewhere classified: Secondary | ICD-10-CM | POA: Diagnosis not present

## 2020-03-03 DIAGNOSIS — N1832 Chronic kidney disease, stage 3b: Secondary | ICD-10-CM | POA: Diagnosis present

## 2020-03-03 DIAGNOSIS — I10 Essential (primary) hypertension: Secondary | ICD-10-CM | POA: Diagnosis not present

## 2020-03-03 DIAGNOSIS — R6510 Systemic inflammatory response syndrome (SIRS) of non-infectious origin without acute organ dysfunction: Secondary | ICD-10-CM | POA: Diagnosis not present

## 2020-03-03 DIAGNOSIS — L4050 Arthropathic psoriasis, unspecified: Secondary | ICD-10-CM | POA: Diagnosis present

## 2020-03-03 DIAGNOSIS — D696 Thrombocytopenia, unspecified: Secondary | ICD-10-CM | POA: Diagnosis present

## 2020-03-03 LAB — CBC
HCT: 31.2 % — ABNORMAL LOW (ref 36.0–46.0)
Hemoglobin: 10.1 g/dL — ABNORMAL LOW (ref 12.0–15.0)
MCH: 30.3 pg (ref 26.0–34.0)
MCHC: 32.4 g/dL (ref 30.0–36.0)
MCV: 93.7 fL (ref 80.0–100.0)
Platelets: 63 10*3/uL — ABNORMAL LOW (ref 150–400)
RBC: 3.33 MIL/uL — ABNORMAL LOW (ref 3.87–5.11)
RDW: 15.2 % (ref 11.5–15.5)
WBC: 7.5 10*3/uL (ref 4.0–10.5)
nRBC: 0.7 % — ABNORMAL HIGH (ref 0.0–0.2)

## 2020-03-03 LAB — BLOOD GAS, ARTERIAL
Acid-base deficit: 3.8 mmol/L — ABNORMAL HIGH (ref 0.0–2.0)
Bicarbonate: 20.7 mmol/L (ref 20.0–28.0)
Drawn by: 519031
FIO2: 100
O2 Saturation: 98.1 %
Patient temperature: 36.7
pCO2 arterial: 36.4 mmHg (ref 32.0–48.0)
pH, Arterial: 7.371 (ref 7.350–7.450)
pO2, Arterial: 120 mmHg — ABNORMAL HIGH (ref 83.0–108.0)

## 2020-03-03 LAB — BASIC METABOLIC PANEL
Anion gap: 17 — ABNORMAL HIGH (ref 5–15)
BUN: 55 mg/dL — ABNORMAL HIGH (ref 8–23)
CO2: 16 mmol/L — ABNORMAL LOW (ref 22–32)
Calcium: 7.9 mg/dL — ABNORMAL LOW (ref 8.9–10.3)
Chloride: 107 mmol/L (ref 98–111)
Creatinine, Ser: 2.02 mg/dL — ABNORMAL HIGH (ref 0.44–1.00)
GFR calc Af Amer: 26 mL/min — ABNORMAL LOW (ref >60)
GFR calc non Af Amer: 23 mL/min — ABNORMAL LOW (ref >60)
Glucose, Bld: 69 mg/dL — ABNORMAL LOW (ref 70–99)
Potassium: 4.8 mmol/L (ref 3.5–5.1)
Sodium: 140 mmol/L (ref 135–145)

## 2020-03-03 LAB — T4, FREE: Free T4: 0.79 ng/dL (ref 0.61–1.12)

## 2020-03-03 LAB — C-REACTIVE PROTEIN: CRP: 6.4 mg/dL — ABNORMAL HIGH (ref <1.0)

## 2020-03-03 LAB — URINE CULTURE

## 2020-03-03 LAB — TSH: TSH: 0.861 u[IU]/mL (ref 0.350–4.500)

## 2020-03-03 MED ORDER — FUROSEMIDE 10 MG/ML IJ SOLN
INTRAMUSCULAR | Status: AC
  Filled 2020-03-03: qty 2

## 2020-03-03 MED ORDER — CHLORHEXIDINE GLUCONATE CLOTH 2 % EX PADS
6.0000 | MEDICATED_PAD | Freq: Every day | CUTANEOUS | Status: DC
  Administered 2020-03-04 – 2020-03-06 (×3): 6 via TOPICAL

## 2020-03-03 MED ORDER — LORAZEPAM 2 MG/ML IJ SOLN
0.5000 mg | INTRAMUSCULAR | Status: DC | PRN
  Administered 2020-03-04 – 2020-03-05 (×3): 0.5 mg via INTRAVENOUS
  Filled 2020-03-03 (×5): qty 1

## 2020-03-03 MED ORDER — SODIUM CHLORIDE 0.9 % IV SOLN
3.0000 g | Freq: Two times a day (BID) | INTRAVENOUS | Status: DC
  Administered 2020-03-04 – 2020-03-06 (×5): 3 g via INTRAVENOUS
  Filled 2020-03-03: qty 8
  Filled 2020-03-03: qty 3
  Filled 2020-03-03: qty 8
  Filled 2020-03-03 (×2): qty 3
  Filled 2020-03-03: qty 8
  Filled 2020-03-03 (×2): qty 3

## 2020-03-03 MED ORDER — SODIUM CHLORIDE 0.9 % IV SOLN
3.0000 g | Freq: Once | INTRAVENOUS | Status: AC
  Administered 2020-03-03: 3 g via INTRAVENOUS
  Filled 2020-03-03: qty 3

## 2020-03-03 MED ORDER — IPRATROPIUM-ALBUTEROL 0.5-2.5 (3) MG/3ML IN SOLN
3.0000 mL | Freq: Four times a day (QID) | RESPIRATORY_TRACT | Status: DC
  Administered 2020-03-03 – 2020-03-05 (×8): 3 mL via RESPIRATORY_TRACT
  Filled 2020-03-03 (×8): qty 3

## 2020-03-03 MED ORDER — IPRATROPIUM-ALBUTEROL 0.5-2.5 (3) MG/3ML IN SOLN: 3.0000 mL | Freq: Four times a day (QID) | RESPIRATORY_TRACT | Status: DC

## 2020-03-03 MED ORDER — MORPHINE SULFATE (PF) 2 MG/ML IV SOLN
1.0000 mg | INTRAVENOUS | Status: DC | PRN
  Administered 2020-03-04: 02:00:00 1 mg via INTRAVENOUS
  Filled 2020-03-03: qty 1

## 2020-03-03 MED ORDER — FUROSEMIDE 10 MG/ML IJ SOLN
20.0000 mg | Freq: Once | INTRAMUSCULAR | Status: AC
  Administered 2020-03-03: 20 mg via INTRAVENOUS
  Filled 2020-03-03: qty 2

## 2020-03-03 MED ORDER — SODIUM CHLORIDE 0.9 % IV SOLN: Freq: Once | INTRAVENOUS | Status: AC

## 2020-03-03 MED ORDER — PROMETHAZINE HCL 25 MG RE SUPP
25.0000 mg | Freq: Four times a day (QID) | RECTAL | Status: DC | PRN
  Filled 2020-03-03: qty 1

## 2020-03-03 NOTE — Progress Notes (Addendum)
PT Cancellation Note  Patient Details Name: [name] [name] MRN: [id number] DOB: 1936/09/22   Cancelled Treatment:    Reason Eval/Treat Not Completed: Medical issues which prohibited therapy.  Tx  Canceled per nsg due to respiratory distress, and will re-attempt as time and pt allow.   Ramond Dial 03/03/2020, 10:58 AM  Mee Hives, PT MS Acute Rehab Dept. Number: [place]

## 2020-03-03 NOTE — Progress Notes (Signed)
RT NOTES: Patient desaturating to 84%, placed on HFNC but patient mouth breathing. Changed to a 100% NRB and sats now 96%. Will continue to monitor.

## 2020-03-03 NOTE — Progress Notes (Signed)
RT NOTES: ABG obtained and sent to lab. Lab tech Amanda notified.  

## 2020-03-03 NOTE — Progress Notes (Signed)
OT Cancellation Note  Patient Details Name: [name] [name] MRN: [id number] DOB: December 22, 1935   Cancelled Treatment:    Reason Eval/Treat Not Completed: Medical issues which prohibited therapy.  Pt with respiratory distress.  Will reattempt as appropriate.  Nilsa Nutting., OTR/L Acute Rehabilitation Services Pager [phone] Office [phone]   Lucille Passy M 03/03/2020, 10:58 AM

## 2020-03-03 NOTE — Progress Notes (Signed)
Called MRI to make them aware that patient was doing better with her respiratory. They are aware of the need for MRI and will get her when they can. Patient is currently resting without any distress. 115/51,76, 34 (shallow, regular) and 99 % on non-rebreather. Payton Emerald, RN

## 2020-03-03 NOTE — Progress Notes (Addendum)
SWOT RN monitored pt while in MRI. After MRI was finished pt vomited up yellow bile. Pt turned to side and suctioned. Pt now with crackles, unable to cough. Pt tachypneic, respirations in 30s, O2 95% on NRB. RN gave report to Katharine Look, Therapist, sports. Katharine Look, RN called MD and updated with patient's status.

## 2020-03-03 NOTE — Plan of Care (Signed)
POC initiated and progressing. 

## 2020-03-03 NOTE — Progress Notes (Signed)
Called daughter [name] with update on patient status. Daughter aware that MRI is yet to be done. She would like call later from MD with update and plan. Payton Emerald, RN

## 2020-03-03 NOTE — Progress Notes (Signed)
Pharmacy Antibiotic Note  [name] [name] is a 84 y.o. female admitted on 03/12/2020 with weakness.  Pharmacy has been consulted for unasyn dosing for possible aspiration pneumonia.  Patient currently afebrile, wbc 7, scr elevated at 2.0. Lactic acid 1.   Plan: Unasyn 3g q12 hours Follow up length of therapy and ability to de-escalate abx  Height: 5\' 1"  (154.9 cm) Weight: 173 lb 1 oz (78.5 kg) IBW/kg (Calculated) : 47.8  Temp (24hrs), Avg:97.6 F (36.4 C), Min:96.1 F (35.6 C), Max:98.6 F (37 C)  Recent Labs  Lab 03/11/2020 1359 03/04/2020 1548 03/03/20 0351  WBC 10.7*  --  7.5  CREATININE 1.38*  --  2.02*  LATICACIDVEN 1.4 1.0  --     Estimated Creatinine Clearance: 20.7 mL/min (A) (by C-G formula based on SCr of 2.02 mg/dL (H)).    Allergies  Allergen Reactions  . Tetanus Toxoids Swelling and Other (See Comments)    Arm (site) became swollen as a child  . Snake Antivenin [Antivenin Crotalidae Polyvalent] Other (See Comments)    Just can't take per daughter  . Yellow Dyes (Non-Tartrazine) Itching and Other (See Comments)    Makes patient nervous     Thank you for allowing pharmacy to be a part of this patient's care.  Erin Hearing PharmD., BCPS Clinical Pharmacist 03/03/2020 2:25 PM

## 2020-03-03 NOTE — Progress Notes (Signed)
Pt had a rectal temp of 96.1 F at the beginning of shift. Informed the on call hospitalist and placed two warmed blankets and turned room temp up to 73 F.  Thirty minutes later the physician put in an order for a warming blanket.  Rechecked rectal temp before placing a bair hugger on the pt, which was 96.6 F. Applied bair hugger over pt on 38 C with two blankets on top at 2310.  Will continue to monitor.  Lupita Dawn, RN

## 2020-03-03 NOTE — Progress Notes (Addendum)
[name]  PROGRESS NOTE    [name] [name]  GEX:[id number] DOB: May 10, 1936 DOA: 02/27/2020 PCP: [name], MD   Brief Narrative:   [name] [name] is a 84 y.o. female with medical history significant of diastolic CHF, paroxysmal atrial fibrillation, CKD, hypothyroidism, and dementia.  History is obtained from review of records and daughter over the phone.  At baseline patient ambulates with the use of a walker and normally sleeps downstairs on the couch.  For the last 2 days she had not been able to get up off of the couch normal.  Her daughter had placed a potty chair beside her, but noted that she was unable to get up and ultimately placed an adult brief on her.  She had not been wanting to eat or drink much despite her daughter trying.  This morning when she awoke the patient was not able to speak.  The patient's daughter appears to manage her medications and had not had any recent changes.  A friend reportedly came over and called EMS.  Patient currently able to talk, but does not report any complaints at this time.  She is unaware of why she is here in the hospital.  Daughter noted similar episode with the patient not being able to walk back in November 2020 where she had to go to [facility] for rehab.  During that hospitalization as well it appears she met SIRS criteria, but did not require any antibiotics as no clear source of infection was found.  EMS found the patient in her bed that was in a sun room for insulation soiled with urine, food crumbs caked around her mouth, and with dry mucous membranes.  Her initial temperature was checked and she was noted to be hypothermic.  3/22: Slowly responsive during initial exam this AM. Using some accessory muscles during exam, so fluids stopped. She apparently had an episode of vomiting later in the morning and possibly aspirated. CXR shows worsening right base opacity. It would be fair to start Unasyn in heart. She has a weak cough. Will get SLP to eval as well.  MRI is pending.   UPDATE: MR Brain negative. Had another vomiting/aspiration episode while on the MR table. At the moment she is satting high 90s and RR are 30 - 40. Updated dtr by phone at 1650hrs. She confirms that she is NO INTUBATION, NO CARDIAC RESUSCITATION, NO HEROIC MEASURES - DNR. I've told her that we will start morphine and ativan. She confirms that she was previously on xanax for anxiety and has struggled with anxiety for some time now, but that medicine was taken away because she was taking too much.   Assessment & Plan:   Principal Problem:   Hypothermia Active Problems:   Hypertension   IBS (irritable bowel syndrome)   Hypothyroidism   Weakness   Chronic diastolic congestive heart failure (HCC)   Dementia (HCC)   SIRS (systemic inflammatory response syndrome) (HCC)   DNR (do not resuscitate)  Hypothermia, SIRS: Acute.       - pt's initial rectal temperature was noted to be 87.9 F with WBC elevated at 10.7.       - at admission, lactic acid was 1.       - CXR noted possible bronchitis     - UA did not show significant signs of infection.       - pt was placed on empiric hugger     - 3/22: Bld Cx, UCx pending/NTD     -  procal is negative     - CRP is 6.4, WBC have decreased to 7.5, temp is improving; sed reate is slightly elevated for her age at 49     - 3/22: TSH is normal, but check FT4       Acute metabolic encephalopathy w/ history of dementia     - Dtr reported the patient was not able to walk over the last 2 days and had developed acute aphasia       - Pt has symptoms secondary to severe hyperthermia as pt's bed is in a sun room with poor insulation       - Pt able to talk after being placed on bear hugger on physical exam at admission, but not able to give any significant details regarding on why she here in the hospital.     - CT Head was negative      - 3/22: MRI pending  Lower extremity weakness, Acute.     - Patient reportedly not able to get up and walk  with use of a walker as previously done over the last 2 days     - PT/OT to treat  Diastolic CHF, Chronic.       - Last EF noted to be 60 to 65% without significant wall motion abnormalities appreciated in 09/2018.       - Pt was euvolemic at admission.     - Strict I&Os, daily wts     - Restart furosemide when medically appropriate  Essential Hypertension     - On admission blood pressures elevated up to 188/81.       - Home blood pressure medications include hydralazine 100 mg twice daily, furosemide 20 mg daily, and metoprolol 75 mg daily.     - Continue hydralazine and metoprolol  Hypothyroidism     - Last TSH noted to be 2.658 on 10/18/2019     - TSH is 0.861; FT4 pending     - Continue levothyroxine  Depression; Chronic.     - Continue Prozac  Irritable bowel syndrome; Chronic.       - Pt is on hyoscyaminen for treatment; continue  Insomina:      - At home patient had been taking 10 mg of Ambien and 200 mg of Seroquel nightly     - Continue Seroquel and reduce Ambien to 5 mg  Thrombocytopenia     - like as a inflammatory response rxn; monitor     - change lovenox to SCDs for now  Acute hypoxic respiratory failure Aspiration     - episode of N/V this AM; possible aspiration, CXR c/w that, start Unasyn, breathing Tx  DVT prophylaxis: SCDs Code Status: DNR Family Communication: Spoke with dtr [name] [name] [phone]) by phone and updated with plan.    Disposition Plan: TBD. W/u ongoing. Requiring higher than baseline O2 use. Not medically stable  Antimicrobials:  . Unasyn   ROS:  Unable to obtain d/t mentation  Subjective: N/V this AM  Objective: Vitals:   02/14/2020 1813 03/05/2020 2052 02/23/2020 2300 03/03/20 0501  BP: (!) 141/61 134/65 (!) 131/48 (!) 118/51  Pulse:  83 91   Resp: (!) 24 (!) 21 19   Temp: 97.8 F (36.6 C) (!) 96.1 F (35.6 C) (!) 97.5 F (36.4 C) (!) 97.5 F (36.4 C)  TempSrc: Axillary Axillary Oral Oral  SpO2: 92% 92% 94% 93%   Weight: 78.8 kg   78.5 kg  Height: 5' 1"  (1.549 m)  Intake/Output Summary (Last 24 hours) at 03/03/2020 9509 Last data filed at 03/03/2020 0503 Gross per 24 hour  Intake 1003.13 ml  Output 650 ml  Net 353.13 ml   Filed Weights   02/19/2020 1813 03/03/20 0501  Weight: 78.8 kg 78.5 kg    Examination:  General: 84 y.o. female resting in bed in NAD Cardiovascular: RRR, +S1, S2, no m/g/r, equal pulses throughout Respiratory: b/l rhonchi, increased WOB GI: BS+, NDNT, no masses noted, no organomegaly noted MSK: No e/c/c Neuro: Alert and following some commands  Data Reviewed: I have personally reviewed following labs and imaging studies.  CBC: Recent Labs  Lab 02/27/2020 1359 03/03/20 0351  WBC 10.7* 7.5  NEUTROABS 10.1*  --   HGB 12.8 10.1*  HCT 39.2 31.2*  MCV 92.7 93.7  PLT 66* 63*   Basic Metabolic Panel: Recent Labs  Lab 02/21/2020 1359 03/03/20 0351  NA 137 140  K 4.7 4.8  CL 100 107  CO2 18* 16*  GLUCOSE 115* 69*  BUN 57* 55*  CREATININE 1.38* 2.02*  CALCIUM 8.6* 7.9*   GFR: Estimated Creatinine Clearance: 20.7 mL/min (A) (by C-G formula based on SCr of 2.02 mg/dL (H)). Liver Function Tests: Recent Labs  Lab 03/08/2020 1359  AST 50*  ALT 39  ALKPHOS 125  BILITOT 1.3*  PROT 6.9  ALBUMIN 3.2*   No results for input(s): LIPASE, AMYLASE in the last 168 hours. No results for input(s): AMMONIA in the last 168 hours. Coagulation Profile: Recent Labs  Lab 02/11/2020 1359  INR 1.0   Cardiac Enzymes: Recent Labs  Lab 02/17/2020 1359  CKTOTAL 50   BNP (last 3 results) No results for input(s): PROBNP in the last 8760 hours. HbA1C: No results for input(s): HGBA1C in the last 72 hours. CBG: No results for input(s): GLUCAP in the last 168 hours. Lipid Profile: No results for input(s): CHOL, HDL, LDLCALC, TRIG, CHOLHDL, LDLDIRECT in the last 72 hours. Thyroid Function Tests: Recent Labs    03/03/20 0351  TSH 0.861   Anemia Panel: No results for  input(s): VITAMINB12, FOLATE, FERRITIN, TIBC, IRON, RETICCTPCT in the last 72 hours. Sepsis Labs: Recent Labs  Lab 03/08/2020 1359 03/11/2020 1548  PROCALCITON <0.10  --   LATICACIDVEN 1.4 1.0    Recent Results (from the past 240 hour(s))  SARS CORONAVIRUS 2 (TAT 6-24 HRS) Nasopharyngeal Nasopharyngeal Swab     Status: None   Collection Time: 03/08/2020  4:44 PM   Specimen: Nasopharyngeal Swab  Result Value Ref Range Status   SARS Coronavirus 2 NEGATIVE NEGATIVE Final    Comment: (NOTE) SARS-CoV-2 target nucleic acids are NOT DETECTED. The SARS-CoV-2 RNA is generally detectable in upper and lower respiratory specimens during the acute phase of infection. Negative results do not preclude SARS-CoV-2 infection, do not rule out co-infections with other pathogens, and should not be used as the sole basis for treatment or other patient management decisions. Negative results must be combined with clinical observations, patient history, and epidemiological information. The expected result is Negative. Fact Sheet for Patients: SugarRoll.be Fact Sheet for Healthcare Providers: https://www.woods-mathews.com/ This test is not yet approved or cleared by the [place] FDA and  has been authorized for detection and/or diagnosis of SARS-CoV-2 by FDA under an Emergency Use Authorization (EUA). This EUA will remain  in effect (meaning this test can be used) for the duration of the COVID-19 declaration under Section 56 4(b)(1) of the Act, 21 U.S.C. section 360bbb-3(b)(1), unless the authorization is terminated or revoked sooner.  Performed at [facility], [place] [address] [place], [place] [address]       Radiology Studies: CT HEAD WO CONTRAST  Result Date: 03/01/2020 CLINICAL DATA:  AMS; POSSIBLE UTI EXAM: CT HEAD WITHOUT CONTRAST TECHNIQUE: Contiguous axial images were obtained from the base of the skull through the vertex without intravenous  contrast. COMPARISON:  CT head 10/18/2019 FINDINGS: Brain: No evidence of acute infarction, hemorrhage, hydrocephalus, extra-axial collection or mass lesion/mass effect. Global atrophy. Periventricular white matter hypodensity consistent with chronic small vessel ischemic change. 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 Small old left lacunar infarct in the lentiform nucleus. Vascular: No hyperdense vessel or unexpected calcification. Skull: Normal. Negative for fracture or focal lesion. Sinuses/Orbits: No acute finding. Other: None. IMPRESSION: No acute intracranial abnormality. Electronically Signed   By: Audie Pinto M.D.   On: 02/11/2020 18:26   DG Chest Portable 1 View  Result Date: 02/18/2020 CLINICAL DATA:  84 year old female with history of altered mental status. Urinary tract infection. EXAM: PORTABLE CHEST 1 VIEW COMPARISON:  Chest x-ray 10/18/2019. FINDINGS: Lung volumes are low. Mild diffuse peribronchial cuffing and areas of interstitial prominence throughout the lungs bilaterally, which may reflect bronchitis. No acute consolidative airspace disease. No definite pleural effusions. No evidence of pulmonary edema. Heart size is normal. Upper mediastinal contours are within normal limits. Aortic atherosclerosis. Status post ORIF in the distal left clavicle. Status post left shoulder arthroplasty. Orthopedic fixation hardware in the lower thoracic and lumbar spine, incompletely imaged. IMPRESSION: 1. Low lung volumes with findings suggestive of potential bronchitis. Electronically Signed   By: Vinnie Langton M.D.   On: 03/04/2020 15:20     Scheduled Meds: . aspirin EC  81 mg Oral q morning - 10a  . chlordiazePOXIDE  5 mg Oral q morning - 10a  . enoxaparin (LOVENOX) injection  40 mg Subcutaneous Q24H  . FLUoxetine  20 mg Oral Daily  . folic acid  1 mg Oral Daily  . hydrALAZINE  100 mg Oral BID  . hyoscyamine  0.375 mg Oral BID  . levothyroxine  100  mcg Oral Q0600  . metoprolol succinate  75 mg Oral Daily  . QUEtiapine  200 mg Oral QHS  . simvastatin  20 mg Oral QHS  . sodium chloride flush  3 mL Intravenous Q12H  . venlafaxine XR  37.5 mg Oral Q breakfast  . cyanocobalamin  1,000 mcg Oral Daily   Continuous Infusions:   LOS: 0 days    Time spent: 25 minutes spent in the coordination of care today.    Jonnie Finner, DO [facility]  If 7PM-7AM, please contact night-coverage [email or web address] 03/03/2020, 7:14 AM

## 2020-03-03 NOTE — Significant Event (Addendum)
Rapid Response Event Note  Overview: Time Called: [id number] Arrival Time: [id number] Event Type: Respiratory  Initial Focused Assessment: Patient in acute respiratory distress, increased WOB and rapid respiratory rate. BP 147/64  SR 92 RR 40  O2 sat 97% on NRB Lung sounds rhochi, wheezes Alert, oriented x 1,  Occasionally able to cough on command.  She is not able to speak more than one word at a time. Face, neck and arms red, legs, torso white/pale.   Warm to touch but rectal temp 98 Per Rn patient vomited the last of her pills but otherwise no signs of aspiration.  Interventions: PCXR ABG  7.37/36/120/20  20 mg lasix IV  Dr Marylyn Ishihara at bedside to assess patient.  Plan of Care (if not transferred): RN to call if respiratory status does not improve post lasix.    ADDENDUM: [id number]:   Per staff patient aspirated bile while in the MRI scanner.  Increased respiratory distress/work of breathing. Zofran, Unasyn given IV  Positioned upright in bed:  Pt breathing more comfortably  Event Summary: Name of Physician Notified: Dr Cherylann Ratel at 1043    at       Event End Time: [place]  Raliegh Ip

## 2020-03-04 ENCOUNTER — Inpatient Hospital Stay (HOSPITAL_COMMUNITY): Payer: Medicare Other

## 2020-03-04 LAB — CBC WITH DIFFERENTIAL/PLATELET
Abs Immature Granulocytes: 0.09 10*3/uL — ABNORMAL HIGH (ref 0.00–0.07)
Basophils Absolute: 0 10*3/uL (ref 0.0–0.1)
Basophils Relative: 0 %
Eosinophils Absolute: 0.2 10*3/uL (ref 0.0–0.5)
Eosinophils Relative: 1 %
HCT: 31.5 % — ABNORMAL LOW (ref 36.0–46.0)
Hemoglobin: 10 g/dL — ABNORMAL LOW (ref 12.0–15.0)
Immature Granulocytes: 1 %
Lymphocytes Relative: 1 %
Lymphs Abs: 0.3 10*3/uL — ABNORMAL LOW (ref 0.7–4.0)
MCH: 30.8 pg (ref 26.0–34.0)
MCHC: 31.7 g/dL (ref 30.0–36.0)
MCV: 96.9 fL (ref 80.0–100.0)
Monocytes Absolute: 1.2 10*3/uL — ABNORMAL HIGH (ref 0.1–1.0)
Monocytes Relative: 7 %
Neutro Abs: 17.1 10*3/uL — ABNORMAL HIGH (ref 1.7–7.7)
Neutrophils Relative %: 90 %
Platelets: 68 10*3/uL — ABNORMAL LOW (ref 150–400)
RBC: 3.25 MIL/uL — ABNORMAL LOW (ref 3.87–5.11)
RDW: 15.9 % — ABNORMAL HIGH (ref 11.5–15.5)
WBC: 18.8 10*3/uL — ABNORMAL HIGH (ref 4.0–10.5)
nRBC: 0.3 % — ABNORMAL HIGH (ref 0.0–0.2)

## 2020-03-04 LAB — CORTISOL-PM, BLOOD: Cortisol - PM: 45.1 ug/dL — ABNORMAL HIGH (ref <10.0)

## 2020-03-04 LAB — COMPREHENSIVE METABOLIC PANEL
ALT: 24 U/L (ref 0–44)
AST: 27 U/L (ref 15–41)
Albumin: 2.5 g/dL — ABNORMAL LOW (ref 3.5–5.0)
Alkaline Phosphatase: 83 U/L (ref 38–126)
Anion gap: 16 — ABNORMAL HIGH (ref 5–15)
BUN: 72 mg/dL — ABNORMAL HIGH (ref 8–23)
CO2: 20 mmol/L — ABNORMAL LOW (ref 22–32)
Calcium: 8.5 mg/dL — ABNORMAL LOW (ref 8.9–10.3)
Chloride: 110 mmol/L (ref 98–111)
Creatinine, Ser: 3.08 mg/dL — ABNORMAL HIGH (ref 0.44–1.00)
GFR calc Af Amer: 16 mL/min — ABNORMAL LOW (ref >60)
GFR calc non Af Amer: 14 mL/min — ABNORMAL LOW (ref >60)
Glucose, Bld: 133 mg/dL — ABNORMAL HIGH (ref 70–99)
Potassium: 5.1 mmol/L (ref 3.5–5.1)
Sodium: 146 mmol/L — ABNORMAL HIGH (ref 135–145)
Total Bilirubin: 1.2 mg/dL (ref 0.3–1.2)
Total Protein: 5.8 g/dL — ABNORMAL LOW (ref 6.5–8.1)

## 2020-03-04 LAB — SODIUM, URINE, RANDOM: Sodium, Ur: <10 mmol/L

## 2020-03-04 LAB — CREATININE, URINE, RANDOM: Creatinine, Urine: 116.37 mg/dL

## 2020-03-04 LAB — MAGNESIUM: Magnesium: 2 mg/dL (ref 1.7–2.4)

## 2020-03-04 MED ORDER — SODIUM CHLORIDE 0.9 % IV SOLN: INTRAVENOUS | Status: DC

## 2020-03-04 MED ORDER — WHITE PETROLATUM EX OINT: TOPICAL_OINTMENT | CUTANEOUS | Status: DC | PRN

## 2020-03-04 MED ORDER — MORPHINE SULFATE (PF) 2 MG/ML IV SOLN
1.0000 mg | Freq: Four times a day (QID) | INTRAVENOUS | Status: DC | PRN
  Administered 2020-03-05 – 2020-03-06 (×2): 1 mg via INTRAVENOUS
  Filled 2020-03-04 (×2): qty 1

## 2020-03-04 MED ORDER — FUROSEMIDE 10 MG/ML IJ SOLN
20.0000 mg | Freq: Once | INTRAMUSCULAR | Status: AC
  Administered 2020-03-04: 20 mg via INTRAVENOUS
  Filled 2020-03-04: qty 2

## 2020-03-04 NOTE — Progress Notes (Signed)
RT NOTES:  Removed patient from bipap to give patient a break from the mask, placed on 100% nonrebreather. Sats 100%. RR 27. Will continue to monitor.

## 2020-03-04 NOTE — Progress Notes (Signed)
Patient states she is very tired and does not like to use the Bipap but will tolerate it if necessary.

## 2020-03-04 NOTE — Procedures (Signed)
Order for Bipap per MD received.  Noted increased WOB and RR with coarse crackles throughout.  RT will continue to monitor.

## 2020-03-04 NOTE — Progress Notes (Signed)
Noticed around 0200, that the pt's lungs were sounding very congested and rhonchi.  Breathing had become labored and shallow.  Paged the hospitalist who ordered a one time IV Lasix 20 mg.  Pt's heart rate was also increasing.  Hospitalist ordered to place pt on BiPap and a STAT chest X-Ray.  No output seen from the Lasix.  After being placed on BiPap, Pt's lungs were no longer rhonchi, heart rate started to decrease and respirations unlabored.  Will continue to monitor.  Lupita Dawn, RN

## 2020-03-04 NOTE — Progress Notes (Signed)
OT Cancellation Note  Patient Details Name: [name] [name] MRN: [id number] DOB: 07-04-1936   Cancelled Treatment:     Reason Eval/Treat Not Completed: Medical issues which prohibited therapy. Per nsg pt is still quite fragile, agreed that pt can have therapy reordered when medically ready.  Follow up when new order is placed.  [place] OT office: [place] 03/04/2020, 11:31 AM

## 2020-03-04 NOTE — Progress Notes (Signed)
PT Cancellation Note  Patient Details Name: [name] [name] MRN: [id number] DOB: 19-Jun-1936   Cancelled Treatment:    Reason Eval/Treat Not Completed: Medical issues which prohibited therapy. Per nsg pt is still quite fragile, agreed that pt can have therapy reordered when medically ready.  Follow up when new order is placed.   Ramond Dial 03/04/2020, 11:19 AM   Mee Hives, PT MS Acute Rehab Dept. Number: [place] and [place]

## 2020-03-04 NOTE — Progress Notes (Signed)
[name]  PROGRESS NOTE    [name] [name]  HTD:[id number] DOB: Jun 15, 1936 DOA: 02/13/2020 PCP: [name], MD   Brief Narrative:   [name] Specht Dixonis a 84 y.o.femalewith medical history significant ofdiastolic CHF, paroxysmal atrial fibrillation, CKD, hypothyroidism, and dementia. History is obtained from review of records and daughter over the phone. At baseline patient ambulates with the use of a walker and normally sleeps downstairs on the couch. For the last 2 days she had not been able to get up off of the couch normal. Her daughter had placed a potty chair beside her, but noted that she was unable to get up and ultimately placed anadult brief on her. She had not been wanting to eat or drink much despite her daughter trying. This morning when she awoke the patient was not able to speak. The patient's daughter appears to manage her medications and had not had any recent changes. A friend reportedly came over and called EMS. Patient currently able to talk, but does not report any complaints at this time. She is unaware of why she is here in the hospital. Daughter noted similar episode with the patient not being able to walk back in November 2020 where she had to go to collapse for rehab. During that hospitalization as well it appears she met SIRS criteria, but did not require any antibiotics as no clear source of infection was found.  EMS found the patient in her bed that was in a sun room for insulation soiled with urine, food crumbs caked around her mouth, and with dry mucous membranes. Her initial temperature was checked and she was noted to be hypothermic.  3/23: Apparently had more vomiting and aspiration last night. More lasix given. Her Scr is up more today. Will give fluids. Check US renal. Check urine creatine/urine urea. Her UOP is not great. Nausea control. Has 2 PRNs for nausea. They need to be used. She has ativan and morphine for anxiety and air hunger. Wean O2 as able. MRI  is negative. KUB is also negative.   Assessment & Plan:   Principal Problem:   Hypothermia Active Problems:   Hypertension   IBS (irritable bowel syndrome)   Hypothyroidism   Weakness   Chronic diastolic congestive heart failure (HCC)   Dementia (HCC)   SIRS (systemic inflammatory response syndrome) (HCC)   DNR (do not resuscitate)  Hypothermia, SIRS: Acute.       - pt's initial rectal temperature was noted to be 87.9 F with WBC elevated at 10.7.       - at admission, lactic acid was 1.       - CXR noted possible bronchitis     - UA did not show significant signs of infection.       - pt was placed on empiric hugger     - 3/22: Bld Cx, UCx pending/NTD     - procal is negative     - CRP is 6.4, WBC have decreased to 7.5, temp is improving; sed reate is slightly elevated for her age at 64     - 3/22: TSH is normal, but check FT4     - 3/23: FT4 is ok; temp is 98.4 oral; etiology still unknown; check cortisol       Acute metabolic encephalopathy w/ history of dementia     - Dtr reported the patient was not able to walk over the last 2 days and had developed acute aphasia       - Pt  has symptoms secondary to severe hyperthermia as pt's bed is in a sun room with poor insulation       - Pt able to talk after being placed on bear hugger on physical exam at admission, but not able to give any significant details regarding on why she here in the hospital.     - CT Head was negative      - 3/23: MRI is negative; she is more alert today and interactive  Lower extremity weakness, Acute.     - Patient reportedly not able to get up and walk with use of a walker as previously done over the last 2 days     - 3/23: MRI is negative; PT/OT to see when more stable  Diastolic CHF, Chronic.       - Last EF noted to be 60 to 65% without significant wall motion abnormalities appreciated in 09/2018.       - Pt was euvolemic at admission.     - Strict I&Os, daily wts     - Restart furosemide when  medically appropriate     - 3/23: SCR is increased and UOP is down; she's had 2 IV doses of lasix in the last 24 hrs that are twice the amount of what she normally has; will check urine studies and add some fluids  Essential Hypertension     - On admission blood pressures elevated up to 188/81.       - Home blood pressure medications include hydralazine 100 mg twice daily, furosemide 20 mg daily, and metoprolol 75 mg daily.     - Continue hydralazine and metoprolol     - 3/23: BP is ok   Hypothyroidism     - Last TSH noted to be 2.658 on 10/18/2019     - TSH is 0.861; FT4 pending     - Continue levothyroxine     - 3/23: FT4 is ok; continue current Tx  Depression; Chronic.     - Continue Prozac  Irritable bowel syndrome; Chronic.       - Pt is on hyoscyaminen for treatment; continue  Insomina:      - At home patient had been taking 10 mg of Ambien and 200 mg of Seroquel nightly     - Continue Seroquel and reduce Ambien to 5 mg  Thrombocytopenia     - like as a inflammatory response rxn; monitor     - change lovenox to SCDs for now  Acute hypoxic respiratory failure Aspiration     - episode of N/V this AM; possible aspiration, CXR c/w that, start Unasyn, breathing Tx     - 3/23: aspiration precautions, required BiPap overnight, wean as able, needs nausea control  AKI     - check urine studies, renal US, give fluids, watch nephrotoxins     - formal consult to nephro if not improved by AM (sidebarred today)   DVT prophylaxis: SCDs Code Status: DNR   Disposition Plan: Not medically stable. Still requiring higher than baseline O2 support. Still altered. Requiring IV antiemetics and fluids.   Antimicrobials:  . Unasyn    Subjective: She appears comfortable on BiPap. Denies pain.   Objective: Vitals:   03/04/20 1100 03/04/20 1134 03/04/20 1339 03/04/20 1514  BP: (!) 105/43 (!) 110/48    Pulse: 74 73    Resp: (!) 34 (!) 27    Temp:  98.4 F (36.9 C)    TempSrc:   Oral    SpO2:  100% 100% 100% 100%  Weight:      Height:        Intake/Output Summary (Last 24 hours) at 03/04/2020 1551 Last data filed at 03/04/2020 0600 Gross per 24 hour  Intake 142.59 ml  Output --  Net 142.59 ml   Filed Weights   03/06/2020 1813 03/03/20 0501 03/04/20 0408  Weight: 78.8 kg 78.5 kg 76.8 kg    Examination:  General: 84 y.o. female resting in bed in NAD,. On Bipap Cardiovascular: RRR, +S1, S2, no m/g/r, equal pulses throughout Respiratory: Bipap sounds, course, decreased at bases  GI: BS+, NDNT, soft MSK: No e/c/c Neuro: Alert and following some commands, nodding yes/no to questioning Psyc: calm/cooperative   Data Reviewed: I have personally reviewed following labs and imaging studies.  CBC: Recent Labs  Lab 02/13/2020 1359 03/03/20 0351 03/04/20 0706  WBC 10.7* 7.5 18.8*  NEUTROABS 10.1*  --  17.1*  HGB 12.8 10.1* 10.0*  HCT 39.2 31.2* 31.5*  MCV 92.7 93.7 96.9  PLT 66* 63* 68*   Basic Metabolic Panel: Recent Labs  Lab 03/08/2020 1359 03/03/20 0351 03/04/20 0706  NA 137 140 146*  K 4.7 4.8 5.1  CL 100 107 110  CO2 18* 16* 20*  GLUCOSE 115* 69* 133*  BUN 57* 55* 72*  CREATININE 1.38* 2.02* 3.08*  CALCIUM 8.6* 7.9* 8.5*  MG  --   --  2.0   GFR: Estimated Creatinine Clearance: 13.4 mL/min (A) (by C-G formula based on SCr of 3.08 mg/dL (H)). Liver Function Tests: Recent Labs  Lab 03/09/2020 1359 03/04/20 0706  AST 50* 27  ALT 39 24  ALKPHOS 125 83  BILITOT 1.3* 1.2  PROT 6.9 5.8*  ALBUMIN 3.2* 2.5*   No results for input(s): LIPASE, AMYLASE in the last 168 hours. No results for input(s): AMMONIA in the last 168 hours. Coagulation Profile: Recent Labs  Lab 03/01/2020 1359  INR 1.0   Cardiac Enzymes: Recent Labs  Lab 02/27/2020 1359  CKTOTAL 50   BNP (last 3 results) No results for input(s): PROBNP in the last 8760 hours. HbA1C: No results for input(s): HGBA1C in the last 72 hours. CBG: No results for input(s): GLUCAP in the  last 168 hours. Lipid Profile: No results for input(s): CHOL, HDL, LDLCALC, TRIG, CHOLHDL, LDLDIRECT in the last 72 hours. Thyroid Function Tests: Recent Labs    03/03/20 0351  TSH 0.861  FREET4 0.79   Anemia Panel: No results for input(s): VITAMINB12, FOLATE, FERRITIN, TIBC, IRON, RETICCTPCT in the last 72 hours. Sepsis Labs: Recent Labs  Lab 02/24/2020 1359 02/24/2020 1548  PROCALCITON <0.10  --   LATICACIDVEN 1.4 1.0    Recent Results (from the past 240 hour(s))  Culture, blood (Routine x 2)     Status: None (Preliminary result)   Collection Time: 03/04/2020  1:59 PM   Specimen: BLOOD  Result Value Ref Range Status   Specimen Description BLOOD BLOOD LEFT HAND  Final   Special Requests   Final    BOTTLES DRAWN AEROBIC AND ANAEROBIC Blood Culture results may not be optimal due to an inadequate volume of blood received in culture bottles Performed at [facility] [address] [place], [place] [address]    Culture NO GROWTH 2 DAYS  Final   Report Status PENDING  Incomplete  Urine culture     Status: Abnormal   Collection Time: 02/25/2020  1:59 PM   Specimen: Urine, Random  Result Value Ref Range Status   Specimen Description  URINE, RANDOM  Final   Special Requests   Final    NONE Performed at [facility], [place] [address] [place], [place] [address]    Culture MULTIPLE SPECIES PRESENT, SUGGEST RECOLLECTION (A)  Final   Report Status 03/03/2020 FINAL  Final  Culture, blood (Routine x 2)     Status: None (Preliminary result)   Collection Time: 03/10/2020  3:48 PM   Specimen: BLOOD RIGHT WRIST  Result Value Ref Range Status   Specimen Description BLOOD RIGHT WRIST  Final   Special Requests   Final    BOTTLES DRAWN AEROBIC AND ANAEROBIC Blood Culture results may not be optimal due to an excessive volume of blood received in culture bottles   Culture NO GROWTH 2 DAYS  Final   Report Status PENDING  Incomplete  SARS CORONAVIRUS 2 (TAT 6-24 HRS) Nasopharyngeal  Nasopharyngeal Swab     Status: None   Collection Time: 02/20/2020  4:44 PM   Specimen: Nasopharyngeal Swab  Result Value Ref Range Status   SARS Coronavirus 2 NEGATIVE NEGATIVE Final    Comment: (NOTE) SARS-CoV-2 target nucleic acids are NOT DETECTED. The SARS-CoV-2 RNA is generally detectable in upper and lower respiratory specimens during the acute phase of infection. Negative results do not preclude SARS-CoV-2 infection, do not rule out co-infections with other pathogens, and should not be used as the sole basis for treatment or other patient management decisions. Negative results must be combined with clinical observations, patient history, and epidemiological information. The expected result is Negative. Fact Sheet for Patients: SugarRoll.be Fact Sheet for Healthcare Providers: https://www.woods-mathews.com/ This test is not yet approved or cleared by the [place] FDA and  has been authorized for detection and/or diagnosis of SARS-CoV-2 by FDA under an Emergency Use Authorization (EUA). This EUA will remain  in effect (meaning this test can be used) for the duration of the COVID-19 declaration under Section 56 4(b)(1) of the Act, 21 U.S.C. section 360bbb-3(b)(1), unless the authorization is terminated or revoked sooner. Performed at [facility], [place] [address], [place], [place] [address]       Radiology Studies: DG Abd 1 View  Result Date: 03/04/2020 CLINICAL DATA:  Nausea and vomiting. EXAM: ABDOMEN - 1 VIEW COMPARISON:  CT 10/18/2019.  Abdomen 03/11/2017. FINDINGS: Surgical clips and staples again noted over the abdomen. No bowel distention or free air. Thoracolumbar and lumbosacral spine fusion again noted. Hardware intact. Right base atelectasis/infiltrate. IMPRESSION: 1.  No acute intra-abdominal abnormality identified. 2.  Right base atelectasis/infiltrate cannot be excluded Electronically Signed   By: Harvey    On: 03/04/2020 08:00   CT HEAD WO CONTRAST  Result Date: 03/01/2020 CLINICAL DATA:  AMS; POSSIBLE UTI EXAM: CT HEAD WITHOUT CONTRAST TECHNIQUE: Contiguous axial images were obtained from the base of the skull through the vertex without intravenous contrast. COMPARISON:  CT head 10/18/2019 FINDINGS: Brain: No evidence of acute infarction, hemorrhage, hydrocephalus, extra-axial collection or mass lesion/mass effect. Global atrophy. Periventricular white matter hypodensity consistent with chronic small vessel ischemic change. 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 Small old left lacunar infarct in the lentiform nucleus. Vascular: No hyperdense vessel or unexpected calcification. Skull: Normal. Negative for fracture or focal lesion. Sinuses/Orbits: No acute finding. Other: None. IMPRESSION: No acute intracranial abnormality. Electronically Signed   By: Audie Pinto M.D.   On: 02/28/2020 18:26   MR BRAIN WO CONTRAST  Result Date: 03/03/2020 CLINICAL DATA:  Encephalopathy EXAM: MRI HEAD  WITHOUT CONTRAST TECHNIQUE: Multiplanar, multiecho pulse sequences of the brain and surrounding structures were obtained without intravenous contrast. COMPARISON:  06/05/2019. FINDINGS: Brain: There is no acute infarction or intracranial hemorrhage. There is no intracranial mass, mass effect, or edema. There is no hydrocephalus or extra-axial fluid collection. Patchy foci of T2 hyperintensity in the supratentorial and pontine white matter are nonspecific but may reflect mild chronic microvascular ischemic changes similar to the prior study. Chronic small vessel infarcts of bilateral basal ganglia are noted with new involvement of the right caudate. Vascular: Major vessel flow voids at the skull base are preserved. Skull and upper cervical spine: Normal marrow signal is preserved. Sinuses/Orbits: Trace mucosal thickening.  No acute orbital finding. Other: Sella is unremarkable. Patchy right  mastoid fluid opacification. IMPRESSION: No acute infarction, hemorrhage, or mass. Mild chronic microvascular ischemic changes. Chronic small vessel infarcts of basal ganglia with new involvement of right caudate. Electronically Signed   By: Macy Mis M.D.   On: 03/03/2020 16:31   DG CHEST PORT 1 VIEW  Result Date: 03/04/2020 CLINICAL DATA:  Increasing shortness of breath EXAM: PORTABLE CHEST 1 VIEW COMPARISON:  Yesterday FINDINGS: Patchy bilateral airspace disease most confluent in the right upper lobe where there has been definite progression. No visible effusion or pneumothorax. Chronic cardiomegaly. Mediastinal contours are distorted by rightward rotation. Extensive spinal fusion and postoperative left shoulder girdle. IMPRESSION: Worsening multifocal pneumonia. Electronically Signed   By: Monte Fantasia M.D.   On: 03/04/2020 04:11   DG CHEST PORT 1 VIEW  Result Date: 03/03/2020 CLINICAL DATA:  Shortness of breath. EXAM: PORTABLE CHEST 1 VIEW COMPARISON:  Chest x-ray from yesterday. FINDINGS: The patient is rotated to the right. The heart size and mediastinal contours are within normal limits. Normal pulmonary vascularity. Similar appearing peribronchial thickening with increasing opacity and interstitial prominence at the right lung base. No pneumothorax or large pleural effusion. No acute osseous abnormality. IMPRESSION: Increasing opacity at the right lung base could reflect atelectasis or pneumonia. Electronically Signed   By: Titus Dubin M.D.   On: 03/03/2020 11:22     Scheduled Meds: . aspirin EC  81 mg Oral q morning - 10a  . chlordiazePOXIDE  5 mg Oral q morning - 10a  . Chlorhexidine Gluconate Cloth  6 each Topical Daily  . FLUoxetine  20 mg Oral Daily  . folic acid  1 mg Oral Daily  . hydrALAZINE  100 mg Oral BID  . hyoscyamine  0.375 mg Oral BID  . ipratropium-albuterol  3 mL Nebulization Q6H  . levothyroxine  100 mcg Oral Q0600  . metoprolol succinate  75 mg Oral Daily   . QUEtiapine  200 mg Oral QHS  . simvastatin  20 mg Oral QHS  . sodium chloride flush  3 mL Intravenous Q12H  . venlafaxine XR  37.5 mg Oral Q breakfast  . cyanocobalamin  1,000 mcg Oral Daily   Continuous Infusions: . sodium chloride 75 mL/hr at 03/04/20 1141  . ampicillin-sulbactam (UNASYN) IV Stopped (03/04/20 0456)     LOS: 1 day    Time spent: 35 minutes spent in the coordination of care today.    Jonnie Finner, DO [facility]  If 7PM-7AM, please contact night-coverage [email or web address] 03/04/2020, 3:51 PM

## 2020-03-04 NOTE — Progress Notes (Signed)
Pt's rectal temp is now 98.1 F. Continue to keep Pt under bair hugger at 38 C.  Will continue to monitor.  Lupita Dawn, RN

## 2020-03-04 NOTE — Progress Notes (Signed)
SLP Cancellation Note  Patient Details Name: JOLEENA PEZZA MRN: [id number] DOB: Dec 15, 1935   Cancelled treatment:       Reason Eval/Treat Not Completed: Patient not medically ready. Pt on BiPAP. Will f/u tomorrow   Herbie Baltimore, MA [place] Pager [phone] Office [phone]  Lynann Beaver 03/04/2020, 10:35 AM

## 2020-03-05 ENCOUNTER — Inpatient Hospital Stay (HOSPITAL_COMMUNITY): Payer: Medicare Other

## 2020-03-05 ENCOUNTER — Other Ambulatory Visit: Payer: Self-pay | Admitting: *Deleted

## 2020-03-05 DIAGNOSIS — I361 Nonrheumatic tricuspid (valve) insufficiency: Secondary | ICD-10-CM

## 2020-03-05 DIAGNOSIS — I4891 Unspecified atrial fibrillation: Secondary | ICD-10-CM

## 2020-03-05 DIAGNOSIS — I340 Nonrheumatic mitral (valve) insufficiency: Secondary | ICD-10-CM

## 2020-03-05 LAB — CBC WITH DIFFERENTIAL/PLATELET
Abs Immature Granulocytes: 0.08 10*3/uL — ABNORMAL HIGH (ref 0.00–0.07)
Basophils Absolute: 0 10*3/uL (ref 0.0–0.1)
Basophils Relative: 0 %
Eosinophils Absolute: 0 10*3/uL (ref 0.0–0.5)
Eosinophils Relative: 0 %
HCT: 32.6 % — ABNORMAL LOW (ref 36.0–46.0)
Hemoglobin: 10 g/dL — ABNORMAL LOW (ref 12.0–15.0)
Immature Granulocytes: 1 %
Lymphocytes Relative: 2 %
Lymphs Abs: 0.3 10*3/uL — ABNORMAL LOW (ref 0.7–4.0)
MCH: 31 pg (ref 26.0–34.0)
MCHC: 30.7 g/dL (ref 30.0–36.0)
MCV: 100.9 fL — ABNORMAL HIGH (ref 80.0–100.0)
Monocytes Absolute: 1.1 10*3/uL — ABNORMAL HIGH (ref 0.1–1.0)
Monocytes Relative: 8 %
Neutro Abs: 12.9 10*3/uL — ABNORMAL HIGH (ref 1.7–7.7)
Neutrophils Relative %: 89 %
Platelets: 72 10*3/uL — ABNORMAL LOW (ref 150–400)
RBC: 3.23 MIL/uL — ABNORMAL LOW (ref 3.87–5.11)
RDW: 16.4 % — ABNORMAL HIGH (ref 11.5–15.5)
WBC: 14.4 10*3/uL — ABNORMAL HIGH (ref 4.0–10.5)
nRBC: 0.8 % — ABNORMAL HIGH (ref 0.0–0.2)

## 2020-03-05 LAB — COMPREHENSIVE METABOLIC PANEL
ALT: 44 U/L (ref 0–44)
AST: 86 U/L — ABNORMAL HIGH (ref 15–41)
Albumin: 2.5 g/dL — ABNORMAL LOW (ref 3.5–5.0)
Alkaline Phosphatase: 146 U/L — ABNORMAL HIGH (ref 38–126)
Anion gap: 17 — ABNORMAL HIGH (ref 5–15)
BUN: 91 mg/dL — ABNORMAL HIGH (ref 8–23)
CO2: 18 mmol/L — ABNORMAL LOW (ref 22–32)
Calcium: 9 mg/dL (ref 8.9–10.3)
Chloride: 111 mmol/L (ref 98–111)
Creatinine, Ser: 3.83 mg/dL — ABNORMAL HIGH (ref 0.44–1.00)
GFR calc Af Amer: 12 mL/min — ABNORMAL LOW (ref >60)
GFR calc non Af Amer: 10 mL/min — ABNORMAL LOW (ref >60)
Glucose, Bld: 125 mg/dL — ABNORMAL HIGH (ref 70–99)
Potassium: 5.5 mmol/L — ABNORMAL HIGH (ref 3.5–5.1)
Sodium: 146 mmol/L — ABNORMAL HIGH (ref 135–145)
Total Bilirubin: 1.3 mg/dL — ABNORMAL HIGH (ref 0.3–1.2)
Total Protein: 6.2 g/dL — ABNORMAL LOW (ref 6.5–8.1)

## 2020-03-05 LAB — URINALYSIS, ROUTINE W REFLEX MICROSCOPIC
Bilirubin Urine: NEGATIVE
Glucose, UA: NEGATIVE mg/dL
Ketones, ur: NEGATIVE mg/dL
Leukocytes,Ua: NEGATIVE
Nitrite: NEGATIVE
Protein, ur: 30 mg/dL — AB
Specific Gravity, Urine: 1.017 (ref 1.005–1.030)
pH: 5 (ref 5.0–8.0)

## 2020-03-05 LAB — CORTISOL-AM, BLOOD: Cortisol - AM: 32.8 ug/dL — ABNORMAL HIGH (ref 6.7–22.6)

## 2020-03-05 LAB — ECHOCARDIOGRAM COMPLETE
Height: 61 in
Weight: 2821.89 oz

## 2020-03-05 LAB — MAGNESIUM: Magnesium: 2.4 mg/dL (ref 1.7–2.4)

## 2020-03-05 MED ORDER — LEVALBUTEROL HCL 0.63 MG/3ML IN NEBU
0.6300 mg | INHALATION_SOLUTION | Freq: Four times a day (QID) | RESPIRATORY_TRACT | Status: DC
  Administered 2020-03-05 – 2020-03-06 (×4): 0.63 mg via RESPIRATORY_TRACT
  Filled 2020-03-05 (×4): qty 3

## 2020-03-05 MED ORDER — AMIODARONE HCL IN DEXTROSE 360-4.14 MG/200ML-% IV SOLN
30.0000 mg/h | INTRAVENOUS | Status: DC
  Administered 2020-03-06: 30 mg/h via INTRAVENOUS
  Filled 2020-03-05: qty 200

## 2020-03-05 MED ORDER — DEXTROSE 5 % IV SOLN: INTRAVENOUS | Status: DC

## 2020-03-05 MED ORDER — SODIUM BICARBONATE 4.2 % IV SOLN: 25.0000 meq | Freq: Once | INTRAVENOUS | Status: DC

## 2020-03-05 MED ORDER — SODIUM BICARBONATE 8.4 % IV SOLN
25.0000 meq | Freq: Once | INTRAVENOUS | Status: AC
  Administered 2020-03-05: 25 meq via INTRAVENOUS
  Filled 2020-03-05: qty 50

## 2020-03-05 MED ORDER — PERFLUTREN LIPID MICROSPHERE
1.0000 mL | INTRAVENOUS | Status: AC | PRN
  Administered 2020-03-05: 3 mL via INTRAVENOUS
  Filled 2020-03-05: qty 10

## 2020-03-05 MED ORDER — DIGOXIN 0.25 MG/ML IJ SOLN
0.2500 mg | Freq: Every day | INTRAMUSCULAR | Status: DC
  Filled 2020-03-05: qty 1

## 2020-03-05 MED ORDER — SODIUM CHLORIDE 0.9 % IV SOLN
INTRAVENOUS | Status: DC | PRN
  Administered 2020-03-05: 500 mL via INTRAVENOUS

## 2020-03-05 MED ORDER — AMIODARONE LOAD VIA INFUSION
150.0000 mg | Freq: Once | INTRAVENOUS | Status: AC
  Administered 2020-03-05: 150 mg via INTRAVENOUS
  Filled 2020-03-05: qty 83.34

## 2020-03-05 MED ORDER — AMIODARONE HCL IN DEXTROSE 360-4.14 MG/200ML-% IV SOLN
INTRAVENOUS | Status: AC
  Filled 2020-03-05: qty 200

## 2020-03-05 MED ORDER — METOPROLOL TARTRATE 5 MG/5ML IV SOLN
5.0000 mg | Freq: Four times a day (QID) | INTRAVENOUS | Status: DC
  Administered 2020-03-05 – 2020-03-06 (×3): 5 mg via INTRAVENOUS
  Filled 2020-03-05 (×3): qty 5

## 2020-03-05 MED ORDER — IPRATROPIUM BROMIDE 0.02 % IN SOLN
0.5000 mg | Freq: Four times a day (QID) | RESPIRATORY_TRACT | Status: DC
  Administered 2020-03-05 – 2020-03-06 (×4): 0.5 mg via RESPIRATORY_TRACT
  Filled 2020-03-05 (×4): qty 2.5

## 2020-03-05 MED ORDER — ALBUMIN HUMAN 25 % IV SOLN
12.5000 g | Freq: Once | INTRAVENOUS | Status: AC
  Administered 2020-03-05: 12.5 g via INTRAVENOUS
  Filled 2020-03-05: qty 50

## 2020-03-05 MED ORDER — HYDRALAZINE HCL 50 MG PO TABS: 50.0000 mg | ORAL_TABLET | Freq: Two times a day (BID) | ORAL | Status: DC

## 2020-03-05 MED ORDER — AMIODARONE HCL IN DEXTROSE 360-4.14 MG/200ML-% IV SOLN
60.0000 mg/h | INTRAVENOUS | Status: DC
  Administered 2020-03-05 (×2): 60 mg/h via INTRAVENOUS
  Filled 2020-03-05: qty 200

## 2020-03-05 NOTE — Progress Notes (Signed)
Patient transported to radiology via bed at 1130.  Now back in room.

## 2020-03-05 NOTE — Progress Notes (Addendum)
  Echocardiogram 2D Echocardiogram has been performed with Definity.  [name] [name] 03/05/2020, 4:37 PM

## 2020-03-05 NOTE — Progress Notes (Signed)
Pt not in need of BIPAP V60 at this time. Pt respiratory status stable on NRB at this time. RT will continue to monitor.

## 2020-03-05 NOTE — Progress Notes (Signed)
1800 Page to Dr. Alfredia Ferguson regarding hearth rate for Mrs. Emanuelson and need for additional medications to lower heart rate.  Call received back from Dr. Alfredia Ferguson, orders received for Amiodarone bolus and drip.  Given by Kathaleen Grinder, RN.  Heart rate decreased to 120's.  Will continue to monitor.

## 2020-03-05 NOTE — Consult Note (Signed)
[facility]  INPATIENT CONSULTATION  Reason for Consultation: AKI Requesting Provider: Dr. Alfredia Ferguson  HPI: [name] [name] is an 84 y.o. female with HFpEF, pAFib, hypothyroidism, dementia and CKD who is seen for evaluation and management of AKI.   She presented to [facility] 3/21 via EMS from home after found to be unable to speak; EMS found her in bed, hypothermic, soiled with urine, food caked around mouth and dry MM. Per report has had decreased po intake lately despite encouragement. She initially HTN on arrival but has had some transient hypotension during admission periodically in the 90/40s.   She's been oscillating between NRB and Bipap --> CXR ?pulm edema vs PNA and has had some aspiration events while admitted.  Treated with IVF and lasix both during admission, net + 1.5 since admission.  CT chest today shows GG opacities BL -- multifocal PNA +/- element of pulm edema; dense RLL, RML, LLL consolidations. She's on unasyn.   MRI of brain unrevealing.   Renal function through 2020 1.1-1.5; on presentation 02/15/2020 1.38 > 2 > 3.08 > 3.83 today.  UA 1.017, + protein, + blood, 0-5 RBC/hpf, 6-10 WBC.  No contrast exposure, no known NSAIDs.  UOP 210mL yesterday. I/Os for the admission +1.5L; admission wt 78.8kg, today 80kg.  Urine sodium 3/23 < 10, urine creatinine 116, FeNa 0.18% Renal US 3/24 R 9, L 10.1, echogenic, bladder decompressed.   PMH: Past Medical History:  Diagnosis Date  . Alopecia   . Anemia    hx of years ago   . Anxiety   . ARF (acute renal failure) ([place]) 08/23/2014  . Arthritis    psoriatic arthritis  . Back pain   . Blood transfusion    at age of 4  . Carotid stenosis 12/2016  . Chronic diastolic CHF (congestive heart failure) ([place])   . Depression   . Dysrhythmia 11/13/2016   PAFib for a short time- Echo done 11/14/16 PAF- to follow up with PCP- to see if she is a candiate for anticoags.  Not started at times time due to alcohol abuse.  [name] GERD  (gastroesophageal reflux disease)   . H/O hiatal hernia   . History of acute cholangitis   . History of GI diverticular bleed   . Hyperlipidemia   . Hypertension   . Hypothyroidism   . IBS (irritable bowel syndrome)   . Pancreatitis   . Peripheral vascular disease ([place])   . PONV (postoperative nausea and vomiting)    with Breast reduction- only time  . Rosacea   . Seizures ([place]) 11/13/2016   Thopught to be from withdrawl Benzodiazepine  . Stroke [facility])    patient denies-  . Thyroid disease    PSH: Past Surgical History:  Procedure Laterality Date  . ABDOMINAL HYSTERECTOMY     partial  . BACK SURGERY  2010   thoractic-screws and rods  . BILE DUCT STENT PLACEMENT  08/26/2014  . BREAST REDUCTION SURGERY    . CHOLECYSTECTOMY  1988   lap  . COLONOSCOPY    . COLONOSCOPY Left 09/08/2014   Procedure: COLONOSCOPY;  Surgeon: Arta Silence, MD;  Location: [facility] ENDOSCOPY;  Service: Endoscopy;  Laterality: Left;  . ENDARTERECTOMY Left 12/28/2016  . ENDARTERECTOMY Left 12/28/2016   Procedure: ENDARTERECTOMY CAROTID LEFT WITH XENOSURE BIOLOGIC PATCH ANGIOPLASTY;  Surgeon: Angelia Mould, MD;  Location: [place];  Service: Vascular;  Laterality: Left;  . ERCP    . ERCP N/A 08/26/2014   Procedure: ENDOSCOPIC RETROGRADE CHOLANGIOPANCREATOGRAPHY (ERCP);  Surgeon: Jeryl Columbia, MD;  Location: [facility] ENDOSCOPY;  Service: Endoscopy;  Laterality: N/A;  . ERCP N/A 09/11/2014   Procedure: ENDOSCOPIC RETROGRADE CHOLANGIOPANCREATOGRAPHY (ERCP);  Surgeon: Arta Silence, MD;  Location: [facility] ENDOSCOPY;  Service: Endoscopy;  Laterality: N/A;  . EUS N/A 09/11/2014   Procedure: ESOPHAGEAL ENDOSCOPIC ULTRASOUND (EUS) RADIAL;  Surgeon: Arta Silence, MD;  Location: [facility] ENDOSCOPY;  Service: Endoscopy;  Laterality: N/A;  . EYE SURGERY Bilateral    Cataract  . JOINT REPLACEMENT  2005   left shoulder replacement   . ORIF CLAVICULAR FRACTURE Left 07/18/2014   Procedure: OPEN REDUCTION INTERNAL FIXATION (ORIF) LEFT  CLAVICULAR FRACTURE;  Surgeon: Ninetta Lights, MD;  Location: [place];  Service: Orthopedics;  Laterality: Left;  . SPYGLASS CHOLANGIOSCOPY N/A 09/11/2014   Procedure: SPYGLASS CHOLANGIOSCOPY;  Surgeon: Arta Silence, MD;  Location: [facility] ENDOSCOPY;  Service: Endoscopy;  Laterality: N/A;  . TONSILLECTOMY  as child  . TOTAL KNEE ARTHROPLASTY Left 03/10/2016   Procedure: LEFT TOTAL KNEE ARTHROPLASTY;  Surgeon: Ninetta Lights, MD;  Location: [place];  Service: Orthopedics;  Laterality: Left;  [name] VENTRAL HERNIA REPAIR  06/08/2012   Procedure: LAPAROSCOPIC VENTRAL HERNIA;  Surgeon: Adin Hector, MD;  Location: [facility] ORS;  Service: General;  Laterality: Right;    Past Medical History:  Diagnosis Date  . Alopecia   . Anemia    hx of years ago   . Anxiety   . ARF (acute renal failure) ([place]) 08/23/2014  . Arthritis    psoriatic arthritis  . Back pain   . Blood transfusion    at age of 4  . Carotid stenosis 12/2016  . Chronic diastolic CHF (congestive heart failure) ([place])   . Depression   . Dysrhythmia 11/13/2016   PAFib for a short time- Echo done 11/14/16 PAF- to follow up with PCP- to see if she is a candiate for anticoags.  Not started at times time due to alcohol abuse.  [name] GERD (gastroesophageal reflux disease)   . H/O hiatal hernia   . History of acute cholangitis   . History of GI diverticular bleed   . Hyperlipidemia   . Hypertension   . Hypothyroidism   . IBS (irritable bowel syndrome)   . Pancreatitis   . Peripheral vascular disease ([place])   . PONV (postoperative nausea and vomiting)    with Breast reduction- only time  . Rosacea   . Seizures ([place]) 11/13/2016   Thopught to be from withdrawl Benzodiazepine  . Stroke [facility])    patient denies-  . Thyroid disease     Medications:  I have reviewed the patient's current medications.  Medications Prior to Admission  Medication Sig Dispense Refill  . acetaminophen (TYLENOL) 500 MG tablet Take 1 tablet (500 mg  total) by mouth every 6 (six) hours as needed for mild pain or headache (pain). (Patient taking differently: Take 1,000 mg by mouth every 6 (six) hours as needed for mild pain or headache (pain). ) 30 tablet 0  . aspirin EC 81 MG tablet Take 1 tablet (81 mg total) by mouth every morning. 30 tablet 0  . Calcium Carbonate Antacid (TUMS PO) Take 1 tablet by mouth daily as needed (indigestion/heartburn).    . chlordiazePOXIDE (LIBRIUM) 5 MG capsule Take 1 capsule (5 mg total) by mouth every morning. 30 capsule 0  . FLUoxetine (PROZAC) 20 MG capsule Take 1 capsule (20 mg total) by mouth daily. 30 capsule 0  . folic acid (FOLVITE) 1 MG tablet  Take 1 tablet (1 mg total) by mouth daily. 30 tablet 0  . furosemide (LASIX) 20 MG tablet Take 1 tablet (20 mg total) by mouth daily. 30 tablet 1  . hydrALAZINE (APRESOLINE) 25 MG tablet Take 100 mg by mouth 2 (two) times daily.     . hyoscyamine (LEVBID) 0.375 MG 12 hr tablet Take 1 tablet (0.375 mg total) by mouth 2 (two) times daily. 60 tablet 0  . levothyroxine (SYNTHROID) 100 MCG tablet Take 1 tablet (100 mcg total) by mouth daily at 6 (six) AM. (Patient taking differently: Take 100 mcg by mouth daily. ) 30 tablet 0  . Melatonin 3 MG TABS Take 3 mg by mouth at bedtime.    . metoprolol succinate (TOPROL-XL) 25 MG 24 hr tablet Take 3 tablets (75 mg total) by mouth daily. 30 tablet 0  . Multiple Vitamins-Minerals (ADULT ONE DAILY GUMMIES) CHEW Chew 2 tablets by mouth daily.    [name] OVER THE COUNTER MEDICATION Take 6 tablets by mouth daily. Juice Plus - 3 fruits and 3 vegies    . potassium chloride SA (KLOR-CON) 20 MEQ tablet Take 20 mEq by mouth daily as needed (leg cramps).     . QUEtiapine (SEROQUEL) 100 MG tablet Take 2 tablets (200 mg total) by mouth at bedtime. (Patient taking differently: Take 100-200 mg by mouth at bedtime. ) 60 tablet 0  . simvastatin (ZOCOR) 20 MG tablet Take 1 tablet (20 mg total) by mouth at bedtime. 30 tablet 0  . thiamine 100 MG tablet  Take 100 mg by mouth daily.    [name] venlafaxine XR (EFFEXOR-XR) 37.5 MG 24 hr capsule Take 1 capsule (37.5 mg total) by mouth daily with breakfast. 30 capsule 0  . zolpidem (AMBIEN) 10 MG tablet Take 10 mg by mouth at bedtime.     . hydrALAZINE (APRESOLINE) 100 MG tablet Take 1 tablet (100 mg total) by mouth 2 (two) times daily. (Patient not taking: Reported on 02/20/2020) 60 tablet 0  . vitamin B-12 1000 MCG tablet Take 1 tablet (1,000 mcg total) by mouth daily. (Patient not taking: Reported on 02/27/2020) 30 tablet 0  . zolpidem (AMBIEN) 5 MG tablet Take 1 tablet (5 mg total) by mouth at bedtime as needed for sleep. (Patient not taking: Reported on 03/06/2020) 30 tablet 0    ALLERGIES:   Allergies  Allergen Reactions  . Tetanus Toxoids Swelling and Other (See Comments)    Arm (site) became swollen as a child  . Snake Antivenin [Antivenin Crotalidae Polyvalent] Other (See Comments)    Just can't take per daughter  . Yellow Dyes (Non-Tartrazine) Itching and Other (See Comments)    Makes patient nervous     FAM HX: Family History  Problem Relation Age of Onset  . Heart disease Mother   . Cancer Father        lung  . Bipolar disorder Brother     Social History:   reports that she has never smoked. She has never used smokeless tobacco. She reports current alcohol use of about 7.0 standard drinks of alcohol per week. She reports that she does not use drugs.  ROS: 12 system ROS per HPI above  Blood pressure (!) 107/49, pulse (!) 57, temperature 98.2 F (36.8 C), temperature source Axillary, resp. rate (!) 24, height 5\' 1"  (1.549 m), weight 80 kg, SpO2 99 %. PHYSICAL EXAM: Gen: elderly woman who is chronically ill appearing but nontoxic  Eyes: anicteric ENT: MM dry and caked Neck: supple, no JVD  CV:  RRR Abd: soft, obese, nontender Lungs: normal WOB at rest, scattered rhonchi; drops to 70s with FM off GU: foley draining amber urine Extr:  No edema Neuro: awake but not oriented     Results for orders placed or performed during the hospital encounter of 02/13/2020 (from the past 48 hour(s))  Comprehensive metabolic panel     Status: Abnormal   Collection Time: 03/04/20  7:06 AM  Result Value Ref Range   Sodium 146 (H) 135 - 145 mmol/L   Potassium 5.1 3.5 - 5.1 mmol/L   Chloride 110 98 - 111 mmol/L   CO2 20 (L) 22 - 32 mmol/L   Glucose, Bld 133 (H) 70 - 99 mg/dL    Comment: Glucose reference range applies only to samples taken after fasting for at least 8 hours.   BUN 72 (H) 8 - 23 mg/dL   Creatinine, Ser 3.08 (H) 0.44 - 1.00 mg/dL    Comment: DELTA CHECK NOTED   Calcium 8.5 (L) 8.9 - 10.3 mg/dL   Total Protein 5.8 (L) 6.5 - 8.1 g/dL   Albumin 2.5 (L) 3.5 - 5.0 g/dL   AST 27 15 - 41 U/L   ALT 24 0 - 44 U/L   Alkaline Phosphatase 83 38 - 126 U/L   Total Bilirubin 1.2 0.3 - 1.2 mg/dL   GFR calc non Af Amer 14 (L) >60 mL/min   GFR calc Af Amer 16 (L) >60 mL/min   Anion gap 16 (H) 5 - 15    Comment: Performed at [facility], [place] [address], [place], [place] [address]  Magnesium     Status: None   Collection Time: 03/04/20  7:06 AM  Result Value Ref Range   Magnesium 2.0 1.7 - 2.4 mg/dL    Comment: Performed at [facility] [address], [place], [place] [address]  CBC with Differential/Platelet     Status: Abnormal   Collection Time: 03/04/20  7:06 AM  Result Value Ref Range   WBC 18.8 (H) 4.0 - 10.5 K/uL   RBC 3.25 (L) 3.87 - 5.11 MIL/uL   Hemoglobin 10.0 (L) 12.0 - 15.0 g/dL   HCT 31.5 (L) 36.0 - 46.0 %   MCV 96.9 80.0 - 100.0 fL   MCH 30.8 26.0 - 34.0 pg   MCHC 31.7 30.0 - 36.0 g/dL   RDW 15.9 (H) 11.5 - 15.5 %   Platelets 68 (L) 150 - 400 K/uL    Comment: REPEATED TO VERIFY Immature Platelet Fraction may be clinically indicated, consider ordering this additional test [id number] CONSISTENT WITH PREVIOUS RESULT    nRBC 0.3 (H) 0.0 - 0.2 %   Neutrophils Relative % 90 %   Neutro Abs 17.1 (H) 1.7 - 7.7 K/uL   Lymphocytes Relative 1 %    Lymphs Abs 0.3 (L) 0.7 - 4.0 K/uL   Monocytes Relative 7 %   Monocytes Absolute 1.2 (H) 0.1 - 1.0 K/uL   Eosinophils Relative 1 %   Eosinophils Absolute 0.2 0.0 - 0.5 K/uL   Basophils Relative 0 %   Basophils Absolute 0.0 0.0 - 0.1 K/uL   Immature Granulocytes 1 %   Abs Immature Granulocytes 0.09 (H) 0.00 - 0.07 K/uL    Comment: Performed at [facility], [place] [address] [place], [place] [address]  Sodium, urine, random     Status: None   Collection Time: 03/04/20 12:15 PM  Result Value Ref Range   Sodium, Ur <10 mmol/L  Comment: Performed at [facility], [place] [address] [place], [place] [address]  Creatinine, urine, random     Status: None   Collection Time: 03/04/20 12:15 PM  Result Value Ref Range   Creatinine, Urine 116.37 mg/dL    Comment: Performed at [facility] [address], [place], [place] [address]  Cortisol-pm, blood     Status: Abnormal   Collection Time: 03/04/20  6:38 PM  Result Value Ref Range   Cortisol - PM 45.1 (H) <10.0 ug/dL    Comment: Performed at [facility] [address] [place], [place] [address]  CBC with Differential/Platelet     Status: Abnormal   Collection Time: 03/05/20  3:14 AM  Result Value Ref Range   WBC 14.4 (H) 4.0 - 10.5 K/uL   RBC 3.23 (L) 3.87 - 5.11 MIL/uL   Hemoglobin 10.0 (L) 12.0 - 15.0 g/dL   HCT 32.6 (L) 36.0 - 46.0 %   MCV 100.9 (H) 80.0 - 100.0 fL   MCH 31.0 26.0 - 34.0 pg   MCHC 30.7 30.0 - 36.0 g/dL   RDW 16.4 (H) 11.5 - 15.5 %   Platelets 72 (L) 150 - 400 K/uL    Comment: REPEATED TO VERIFY SPECIMEN CHECKED FOR CLOTS Immature Platelet Fraction may be clinically indicated, consider ordering this additional test [id number] CONSISTENT WITH PREVIOUS RESULT    nRBC 0.8 (H) 0.0 - 0.2 %   Neutrophils Relative % 89 %   Neutro Abs 12.9 (H) 1.7 - 7.7 K/uL   Lymphocytes Relative 2 %   Lymphs Abs 0.3 (L) 0.7 - 4.0 K/uL   Monocytes Relative 8 %   Monocytes Absolute 1.1 (H) 0.1 - 1.0 K/uL    Eosinophils Relative 0 %   Eosinophils Absolute 0.0 0.0 - 0.5 K/uL   Basophils Relative 0 %   Basophils Absolute 0.0 0.0 - 0.1 K/uL   Immature Granulocytes 1 %   Abs Immature Granulocytes 0.08 (H) 0.00 - 0.07 K/uL    Comment: Performed at [facility], [place] [address], [place], [place] [address]  Comprehensive metabolic panel     Status: Abnormal   Collection Time: 03/05/20  3:14 AM  Result Value Ref Range   Sodium 146 (H) 135 - 145 mmol/L   Potassium 5.5 (H) 3.5 - 5.1 mmol/L   Chloride 111 98 - 111 mmol/L   CO2 18 (L) 22 - 32 mmol/L   Glucose, Bld 125 (H) 70 - 99 mg/dL    Comment: Glucose reference range applies only to samples taken after fasting for at least 8 hours.   BUN 91 (H) 8 - 23 mg/dL   Creatinine, Ser 3.83 (H) 0.44 - 1.00 mg/dL   Calcium 9.0 8.9 - 10.3 mg/dL   Total Protein 6.2 (L) 6.5 - 8.1 g/dL   Albumin 2.5 (L) 3.5 - 5.0 g/dL   AST 86 (H) 15 - 41 U/L   ALT 44 0 - 44 U/L   Alkaline Phosphatase 146 (H) 38 - 126 U/L   Total Bilirubin 1.3 (H) 0.3 - 1.2 mg/dL   GFR calc non Af Amer 10 (L) >60 mL/min   GFR calc Af Amer 12 (L) >60 mL/min   Anion gap 17 (H) 5 - 15    Comment: Performed at [facility], [place] [address] [place], [place] [address]  Magnesium     Status: None   Collection Time: 03/05/20  3:14 AM  Result Value Ref Range  Magnesium 2.4 1.7 - 2.4 mg/dL    Comment: Performed at [facility], [place] [address], [place], [place] [address]  Cortisol-am, blood     Status: Abnormal   Collection Time: 03/05/20  7:18 AM  Result Value Ref Range   Cortisol - AM 32.8 (H) 6.7 - 22.6 ug/dL    Comment: Performed at [facility] [address] [place], [place] [address]  Urinalysis, Routine w reflex microscopic     Status: Abnormal   Collection Time: 03/05/20 11:25 AM  Result Value Ref Range   Color, Urine YELLOW YELLOW   APPearance CLEAR CLEAR   Specific Gravity, Urine 1.017 1.005 - 1.030   pH 5.0 5.0 - 8.0   Glucose, UA NEGATIVE NEGATIVE mg/dL    Hgb urine dipstick SMALL (A) NEGATIVE   Bilirubin Urine NEGATIVE NEGATIVE   Ketones, ur NEGATIVE NEGATIVE mg/dL   Protein, ur 30 (A) NEGATIVE mg/dL   Nitrite NEGATIVE NEGATIVE   Leukocytes,Ua NEGATIVE NEGATIVE   RBC / HPF 0-5 0 - 5 RBC/hpf   WBC, UA 6-10 0 - 5 WBC/hpf   Bacteria, UA RARE (A) NONE SEEN   Squamous Epithelial / LPF 0-5 0 - 5   Mucus PRESENT    Hyaline Casts, UA PRESENT     Comment: Performed at [facility], [address], [place], [place] [address]    DG Abd 1 View  Result Date: 03/04/2020 CLINICAL DATA:  Nausea and vomiting. EXAM: ABDOMEN - 1 VIEW COMPARISON:  CT 10/18/2019.  Abdomen 03/11/2017. FINDINGS: Surgical clips and staples again noted over the abdomen. No bowel distention or free air. Thoracolumbar and lumbosacral spine fusion again noted. Hardware intact. Right base atelectasis/infiltrate. IMPRESSION: 1.  No acute intra-abdominal abnormality identified. 2.  Right base atelectasis/infiltrate cannot be excluded Electronically Signed   By: Marcello Moores  Register   On: 03/04/2020 08:00   MR BRAIN WO CONTRAST  Result Date: 03/03/2020 CLINICAL DATA:  Encephalopathy EXAM: MRI HEAD WITHOUT CONTRAST TECHNIQUE: Multiplanar, multiecho pulse sequences of the brain and surrounding structures were obtained without intravenous contrast. COMPARISON:  06/05/2019. FINDINGS: Brain: There is no acute infarction or intracranial hemorrhage. There is no intracranial mass, mass effect, or edema. There is no hydrocephalus or extra-axial fluid collection. Patchy foci of T2 hyperintensity in the supratentorial and pontine white matter are nonspecific but may reflect mild chronic microvascular ischemic changes similar to the prior study. Chronic small vessel infarcts of bilateral basal ganglia are noted with new involvement of the right caudate. Vascular: Major vessel flow voids at the skull base are preserved. Skull and upper cervical spine: Normal marrow signal is preserved. Sinuses/Orbits:  Trace mucosal thickening.  No acute orbital finding. Other: Sella is unremarkable. Patchy right mastoid fluid opacification. IMPRESSION: No acute infarction, hemorrhage, or mass. Mild chronic microvascular ischemic changes. Chronic small vessel infarcts of basal ganglia with new involvement of right caudate. Electronically Signed   By: Macy Mis M.D.   On: 03/03/2020 16:31   US RENAL  Result Date: 03/04/2020 CLINICAL DATA:  Oliguria EXAM: RENAL / URINARY TRACT ULTRASOUND COMPLETE COMPARISON:  None. FINDINGS: Right Kidney: Renal measurements: 9.0 x 5.6 x 5.7 cm. = volume: 151 mL. Increased echogenicity is noted. No mass lesion or hydronephrosis is seen. Left Kidney: Renal measurements: 10.1 x 5.9 x 4.8 cm = volume: 149 mL. 1.1 cm cyst is noted in the upper pole. Cortical thinning and increased echogenicity is noted. No mass lesion or hydronephrosis is seen. Bladder: Decompressed by Foley  catheter. Other: None. IMPRESSION: Increased echogenicity. Small left renal cyst. No obstructive changes are noted. Electronically Signed   By: Inez Catalina M.D.   On: 03/04/2020 16:41   DG CHEST PORT 1 VIEW  Result Date: 03/04/2020 CLINICAL DATA:  Increasing shortness of breath EXAM: PORTABLE CHEST 1 VIEW COMPARISON:  Yesterday FINDINGS: Patchy bilateral airspace disease most confluent in the right upper lobe where there has been definite progression. No visible effusion or pneumothorax. Chronic cardiomegaly. Mediastinal contours are distorted by rightward rotation. Extensive spinal fusion and postoperative left shoulder girdle. IMPRESSION: Worsening multifocal pneumonia. Electronically Signed   By: Monte Fantasia M.D.   On: 03/04/2020 04:11    Assessment/Plan **AKI:  Mild CKD at baseline now with significant AKI.  UA with small protein and blood, can check UP/C but doesn't look like acute GN.  Renal US fine.  Low urine sodium and I think a small volume challenge is in order as she clinically appears so dry.  Will  be judicious given ?pulm edema on CT chest though I think it's worth it with the low urine sodium and dry physical exam.  If that's ineffective I suspect this is hemodynamically mediated ATN in setting of sepsis.  She would not be a candidate for dialysis with her dementia and poor functional status.   **hypoxic respiratory failure:  Appears this is more driven by aspiration, pneumonia than fluid based on CT chest.  Cont abx.    **HTN:  Has been having some low BPs so I decreased hydralazine to 50 BID to prevent further hypotension > ATN.   **Hyperkalemia:  Secondary to AKI.  lokelma 10g today ordered, follow.   **Anemia: mild, no role for ESA  **Thrombocytopenia: since 02/2019, Plt 72, no bleeding.  CTM.    Justin Mend 03/05/2020, 12:33 PM

## 2020-03-05 NOTE — Consult Note (Addendum)
Cardiology Consultation:   Patient ID: [name] [name] MRN: [id number]; DOB: 15-Mar-1936  Admit date: 02/20/2020 Date of Consult: 03/05/2020  Primary Care Provider: [name], MD Primary Cardiologist: No primary care provider on file.  Primary Electrophysiologist:  None    Patient Profile:   [name] [name] is a 84 y.o. female with a hx of chronic diastolic HF, HTN, PAF (not on AC 2/2 to falls), anxiety/depression, ETOH abuse and psoriatic arthritis who is being seen today for the evaluation of Afib at the request of Dr. Alfredia Ferguson.  History of Present Illness:   [name] [name] is an 84 yo female with PMH noted above. She was seen by Dr. Burt Knack back in 4/20 after experiencing a fall which was felt to be 2/2 to polypharmacy/ETOH [place]. She was treated for hypothermia, AKI, possible aspiration PNA and Afib RVR a month prior to this admission. She was discharged to CIR afterwards. She was treated for acute on chronic diastolic HF during her CIR admission. Treated with metoprolol with a ChadsVasc of at least 5 but not on [place] 2/2 to falls.   Presented to the ED on 3/21 via EMS from home after found to be unable to speak. Information was obtained from the daughter over phone. Reported at baseline she ambulates with the use of a walker. For 2 days prior to admission she was not at her baseline and very weak. The morning of admission daughter noted she was unable to speak. EMS was called.   In the ED her labs showed stable electrolytes, Cr 1.3, WBC 10.7, Lactic acid 1.4, CRP 6.4. Rectal temp was noted at 87.9, and had transient hypotension. CXR showed pulmonary edema via PNA. She was given IVFs and admitted for sepsis. Bair Hugger placed on admission. CT chest showed ground glass opacities bilaterally with multifocal PNA. Placed on antibiotics, with concern for aspiration PNA. CT head and MRI were negative. Her Cr had worsened to 3.83. Seen by nephrology with plans for small volume challenge today. Felt this is  likely relates to ATN in the setting of sepsis.   No EKG on admission. In review of telemetry she has been in Afib this admission. Rates have been mostly elevated in the 100s up to 140s at times. She is unaware of her HR.    Past Medical History:  Diagnosis Date  . Alopecia   . Anemia    hx of years ago   . Anxiety   . ARF (acute renal failure) ([place]) 08/23/2014  . Arthritis    psoriatic arthritis  . Back pain   . Blood transfusion    at age of 4  . Carotid stenosis 12/2016  . Chronic diastolic CHF (congestive heart failure) ([place])   . Depression   . Dysrhythmia 11/13/2016   PAFib for a short time- Echo done 11/14/16 PAF- to follow up with PCP- to see if she is a candiate for anticoags.  Not started at times time due to alcohol abuse.  [name] GERD (gastroesophageal reflux disease)   . H/O hiatal hernia   . History of acute cholangitis   . History of GI diverticular bleed   . Hyperlipidemia   . Hypertension   . Hypothyroidism   . IBS (irritable bowel syndrome)   . Pancreatitis   . Peripheral vascular disease ([place])   . PONV (postoperative nausea and vomiting)    with Breast reduction- only time  . Rosacea   . Seizures ([place]) 11/13/2016   Thopught to be from withdrawl Benzodiazepine  .  Stroke [facility])    patient denies-  . Thyroid disease     Past Surgical History:  Procedure Laterality Date  . ABDOMINAL HYSTERECTOMY     partial  . BACK SURGERY  2010   thoractic-screws and rods  . BILE DUCT STENT PLACEMENT  08/26/2014  . BREAST REDUCTION SURGERY    . CHOLECYSTECTOMY  1988   lap  . COLONOSCOPY    . COLONOSCOPY Left 09/08/2014   Procedure: COLONOSCOPY;  Surgeon: Arta Silence, MD;  Location: [facility] ENDOSCOPY;  Service: Endoscopy;  Laterality: Left;  . ENDARTERECTOMY Left 12/28/2016  . ENDARTERECTOMY Left 12/28/2016   Procedure: ENDARTERECTOMY CAROTID LEFT WITH XENOSURE BIOLOGIC PATCH ANGIOPLASTY;  Surgeon: Angelia Mould, MD;  Location: [place];  Service: Vascular;  Laterality:  Left;  . ERCP    . ERCP N/A 08/26/2014   Procedure: ENDOSCOPIC RETROGRADE CHOLANGIOPANCREATOGRAPHY (ERCP);  Surgeon: Jeryl Columbia, MD;  Location: [facility] ENDOSCOPY;  Service: Endoscopy;  Laterality: N/A;  . ERCP N/A 09/11/2014   Procedure: ENDOSCOPIC RETROGRADE CHOLANGIOPANCREATOGRAPHY (ERCP);  Surgeon: Arta Silence, MD;  Location: [facility] ENDOSCOPY;  Service: Endoscopy;  Laterality: N/A;  . EUS N/A 09/11/2014   Procedure: ESOPHAGEAL ENDOSCOPIC ULTRASOUND (EUS) RADIAL;  Surgeon: Arta Silence, MD;  Location: [facility] ENDOSCOPY;  Service: Endoscopy;  Laterality: N/A;  . EYE SURGERY Bilateral    Cataract  . JOINT REPLACEMENT  2005   left shoulder replacement   . ORIF CLAVICULAR FRACTURE Left 07/18/2014   Procedure: OPEN REDUCTION INTERNAL FIXATION (ORIF) LEFT CLAVICULAR FRACTURE;  Surgeon: Ninetta Lights, MD;  Location: [place];  Service: Orthopedics;  Laterality: Left;  . SPYGLASS CHOLANGIOSCOPY N/A 09/11/2014   Procedure: SPYGLASS CHOLANGIOSCOPY;  Surgeon: Arta Silence, MD;  Location: [facility] ENDOSCOPY;  Service: Endoscopy;  Laterality: N/A;  . TONSILLECTOMY  as child  . TOTAL KNEE ARTHROPLASTY Left 03/10/2016   Procedure: LEFT TOTAL KNEE ARTHROPLASTY;  Surgeon: Ninetta Lights, MD;  Location: [place];  Service: Orthopedics;  Laterality: Left;  [name] VENTRAL HERNIA REPAIR  06/08/2012   Procedure: LAPAROSCOPIC VENTRAL HERNIA;  Surgeon: Adin Hector, MD;  Location: [facility] ORS;  Service: General;  Laterality: Right;     Home Medications:  Prior to Admission medications   Medication Sig Start Date End Date Taking? Authorizing Provider  acetaminophen (TYLENOL) 500 MG tablet Take 1 tablet (500 mg total) by mouth every 6 (six) hours as needed for mild pain or headache (pain). Patient taking differently: Take 1,000 mg by mouth every 6 (six) hours as needed for mild pain or headache (pain).  10/24/19  Yes [place] Donnamarie Poag, DO  aspirin EC 81 MG tablet Take 1 tablet (81 mg total) by mouth every morning. 10/24/19   Yes [place] Donnamarie Poag, DO  Calcium Carbonate Antacid (TUMS PO) Take 1 tablet by mouth daily as needed (indigestion/heartburn).   Yes [provider]  chlordiazePOXIDE (LIBRIUM) 5 MG capsule Take 1 capsule (5 mg total) by mouth every morning. 10/25/19  Yes [place] Eric J, DO  FLUoxetine (PROZAC) 20 MG capsule Take 1 capsule (20 mg total) by mouth daily. 10/24/19  Yes [place] Donnamarie Poag, DO  folic acid (FOLVITE) 1 MG tablet Take 1 tablet (1 mg total) by mouth daily. 10/24/19  Yes [place] Eric J, DO  furosemide (LASIX) 20 MG tablet Take 1 tablet (20 mg total) by mouth daily. 10/24/19  Yes [place] Eric J, DO  hydrALAZINE (APRESOLINE) 25 MG tablet Take 100 mg by mouth 2 (two) times daily.  02/14/20  Yes [provider]  hyoscyamine (LEVBID) 0.375 MG 12 hr tablet Take 1 tablet (0.375 mg total) by mouth 2 (two) times daily. 10/24/19  Yes [place] Donnamarie Poag, DO  levothyroxine (SYNTHROID) 100 MCG tablet Take 1 tablet (100 mcg total) by mouth daily at 6 (six) AM. Patient taking differently: Take 100 mcg by mouth daily.  10/24/19  Yes [place] Eric J, DO  Melatonin 3 MG TABS Take 3 mg by mouth at bedtime.   Yes [provider]  metoprolol succinate (TOPROL-XL) 25 MG 24 hr tablet Take 3 tablets (75 mg total) by mouth daily. 10/25/19  Yes [place] Donnamarie Poag, DO  Multiple Vitamins-Minerals (ADULT ONE DAILY GUMMIES) CHEW Chew 2 tablets by mouth daily.   Yes [provider]  OVER THE COUNTER MEDICATION Take 6 tablets by mouth daily. Juice Plus - 3 fruits and 3 vegies   Yes [provider]  potassium chloride SA (KLOR-CON) 20 MEQ tablet Take 20 mEq by mouth daily as needed (leg cramps).  02/29/20  Yes [provider]  QUEtiapine (SEROQUEL) 100 MG tablet Take 2 tablets (200 mg total) by mouth at bedtime. Patient taking differently: Take 100-200 mg by mouth at bedtime.  10/24/19 04/11/20 Yes [place] Eric J, DO  simvastatin (ZOCOR) 20 MG tablet Take 1 tablet (20 mg total) by  mouth at bedtime. 10/24/19  Yes [place] Eric J, DO  thiamine 100 MG tablet Take 100 mg by mouth daily.   Yes [provider]  venlafaxine XR (EFFEXOR-XR) 37.5 MG 24 hr capsule Take 1 capsule (37.5 mg total) by mouth daily with breakfast. 10/24/19  Yes [place] Eric J, DO  zolpidem (AMBIEN) 10 MG tablet Take 10 mg by mouth at bedtime.  01/22/20  Yes [provider]  hydrALAZINE (APRESOLINE) 100 MG tablet Take 1 tablet (100 mg total) by mouth 2 (two) times daily. Patient not taking: Reported on 03/11/2020 10/24/19   [place] Donnamarie Poag, DO  vitamin B-12 1000 MCG tablet Take 1 tablet (1,000 mcg total) by mouth daily. Patient not taking: Reported on 03/10/2020 10/25/19   [place] Donnamarie Poag, DO  zolpidem (AMBIEN) 5 MG tablet Take 1 tablet (5 mg total) by mouth at bedtime as needed for sleep. Patient not taking: Reported on 02/27/2020 10/24/19   [place] Eric J, DO    Inpatient Medications: Scheduled Meds: . aspirin EC  81 mg Oral q morning - 10a  . chlordiazePOXIDE  5 mg Oral q morning - 10a  . Chlorhexidine Gluconate Cloth  6 each Topical Daily  . digoxin  0.25 mg Intravenous Daily  . FLUoxetine  20 mg Oral Daily  . folic acid  1 mg Oral Daily  . hydrALAZINE  50 mg Oral BID  . hyoscyamine  0.375 mg Oral BID  . ipratropium  0.5 mg Nebulization Q6H  . levalbuterol  0.63 mg Nebulization Q6H  . levothyroxine  100 mcg Oral Q0600  . metoprolol succinate  75 mg Oral Daily  . QUEtiapine  200 mg Oral QHS  . simvastatin  20 mg Oral QHS  . sodium chloride flush  3 mL Intravenous Q12H  . venlafaxine XR  37.5 mg Oral Q breakfast  . cyanocobalamin  1,000 mcg Oral Daily   Continuous Infusions: . albumin human    . ampicillin-sulbactam (UNASYN) IV 3 g (03/05/20 0546)  . dextrose 75 mL/hr at 03/05/20 0950   PRN Meds: acetaminophen **OR** acetaminophen, LORazepam, morphine injection, ondansetron **OR** ondansetron (ZOFRAN) IV, promethazine, white petrolatum, zolpidem  Allergies:  Allergies  Allergen Reactions  . Tetanus Toxoids Swelling and Other (See Comments)    Arm (site) became swollen as a child  . Snake Antivenin [Antivenin Crotalidae Polyvalent] Other (See Comments)    Just can't take per daughter  . Yellow Dyes (Non-Tartrazine) Itching and Other (See Comments)    Makes patient nervous     Social History:   Social History   Socioeconomic History  . Marital status: Married    Spouse name: Not on file  . Number of children: Not on file  . Years of education: Not on file  . Highest education level: Not on file  Occupational History  . Not on file  Tobacco Use  . Smoking status: Never Smoker  . Smokeless tobacco: Never Used  Substance and Sexual Activity  . Alcohol use: Yes    Alcohol/week: 7.0 standard drinks    Types: 7 Glasses of wine per week    Comment: rare  . Drug use: No    Types: Benzodiazepines  . Sexual activity: Not Currently  Other Topics Concern  . Not on file  Social History Narrative  . Not on file   Social Determinants of Health   Financial Resource Strain:   . Difficulty of Paying Living Expenses:   Food Insecurity: No Food Insecurity  . Worried About Charity fundraiser in the Last Year: Never true  . Ran Out of Food in the Last Year: Never true  Transportation Needs: No Transportation Needs  . Lack of Transportation (Medical): No  . Lack of Transportation (Non-Medical): No  Physical Activity:   . Days of Exercise per Week:   . Minutes of Exercise per Session:   Stress:   . Feeling of Stress :   Social Connections:   . Frequency of Communication with Friends and Family:   . Frequency of Social Gatherings with Friends and Family:   . Attends Religious Services:   . Active Member of Clubs or Organizations:   . Attends Archivist Meetings:   [name] Marital Status:   Intimate Partner Violence:   . Fear of Current or Ex-Partner:   . Emotionally Abused:   [name] Physically Abused:   . Sexually Abused:      Family History:    Family History  Problem Relation Age of Onset  . Heart disease Mother   . Cancer Father        lung  . Bipolar disorder Brother      ROS:  Please see the history of present illness.   All other ROS reviewed and negative.     Physical Exam/Data:   Vitals:   03/05/20 0327 03/05/20 0410 03/05/20 0753 03/05/20 0900  BP: 115/74   (!) 107/49  Pulse: 70   (!) 57  Resp: (!) 31   (!) 24  Temp: (!) 97.5 F (36.4 C)   98.2 F (36.8 C)  TempSrc: Axillary   Axillary  SpO2: 100%  100% 99%  Weight:  80 kg    Height:        Intake/Output Summary (Last 24 hours) at 03/05/2020 1335 Last data filed at 03/05/2020 0900 Gross per 24 hour  Intake 1631.97 ml  Output 450 ml  Net 1181.97 ml   Last 3 Weights 03/05/2020 03/04/2020 03/03/2020  Weight (lbs) 176 lb 5.9 oz 169 lb 5 oz 173 lb 1 oz  Weight (kg) 80 kg 76.8 kg 78.5 kg     Body mass index is 33.32 kg/m.  General:  Frail,  pale older WF laying bed.  HEENT: normal Lymph: no adenopathy Neck: no JVD Endocrine:  No thryomegaly Vascular: No carotid bruits; FA pulses 2+ bilaterally without bruits  Cardiac:  normal S1, S2; Irreg Irreg, tachy; no murmur  Lungs:  Course anterior breath sounds Abd: soft, nontender, no hepatomegaly  Ext: trace LE edema Musculoskeletal:  No deformities, BUE and BLE strength normal and equal Skin: warm and dry  Neuro:  CNs 2-12 intact, no focal abnormalities noted Psych:  Normal affect   EKG:  The EKG was personally reviewed and demonstrates:  N/a Telemetry:  Telemetry was personally reviewed and demonstrates:  Afib RVR  Relevant CV Studies:  Echo: 10/19  Study Conclusions   - Left ventricle: The cavity size was normal. Wall thickness was  increased in a pattern of moderate LVH. Systolic function was  normal. The estimated ejection fraction was in the range of 60%  to 65%. Wall motion was normal; there were no regional wall  motion abnormalities.  - Mitral valve: There  was mild regurgitation.  - Left atrium: The atrium was mildly dilated.  - Pulmonary arteries: PA peak pressure: 32 mm Hg (S).   Laboratory Data:  High Sensitivity Troponin:  No results for input(s): TROPONINIHS in the last 720 hours.   Chemistry Recent Labs  Lab 03/03/20 0351 03/04/20 0706 03/05/20 0314  NA 140 146* 146*  K 4.8 5.1 5.5*  CL 107 110 111  CO2 16* 20* 18*  GLUCOSE 69* 133* 125*  BUN 55* 72* 91*  CREATININE 2.02* 3.08* 3.83*  CALCIUM 7.9* 8.5* 9.0  GFRNONAA 23* 14* 10*  GFRAA 26* 16* 12*  ANIONGAP 17* 16* 17*    Recent Labs  Lab 03/04/2020 1359 03/04/20 0706 03/05/20 0314  PROT 6.9 5.8* 6.2*  ALBUMIN 3.2* 2.5* 2.5*  AST 50* 27 86*  ALT 39 24 44  ALKPHOS 125 83 146*  BILITOT 1.3* 1.2 1.3*   Hematology Recent Labs  Lab 03/03/20 0351 03/04/20 0706 03/05/20 0314  WBC 7.5 18.8* 14.4*  RBC 3.33* 3.25* 3.23*  HGB 10.1* 10.0* 10.0*  HCT 31.2* 31.5* 32.6*  MCV 93.7 96.9 100.9*  MCH 30.3 30.8 31.0  MCHC 32.4 31.7 30.7  RDW 15.2 15.9* 16.4*  PLT 63* 68* 72*   BNPNo results for input(s): BNP, PROBNP in the last 168 hours.  DDimer No results for input(s): DDIMER in the last 168 hours.   Radiology/Studies:  DG Abd 1 View  Result Date: 03/04/2020 CLINICAL DATA:  Nausea and vomiting. EXAM: ABDOMEN - 1 VIEW COMPARISON:  CT 10/18/2019.  Abdomen 03/11/2017. FINDINGS: Surgical clips and staples again noted over the abdomen. No bowel distention or free air. Thoracolumbar and lumbosacral spine fusion again noted. Hardware intact. Right base atelectasis/infiltrate. IMPRESSION: 1.  No acute intra-abdominal abnormality identified. 2.  Right base atelectasis/infiltrate cannot be excluded Electronically Signed   By: Pershing   On: 03/04/2020 08:00   CT HEAD WO CONTRAST  Result Date: 02/15/2020 CLINICAL DATA:  AMS; POSSIBLE UTI EXAM: CT HEAD WITHOUT CONTRAST TECHNIQUE: Contiguous axial images were obtained from the base of the skull through the vertex without  intravenous contrast. COMPARISON:  CT head 10/18/2019 FINDINGS: Brain: No evidence of acute infarction, hemorrhage, hydrocephalus, extra-axial collection or mass lesion/mass effect. Global atrophy. Periventricular white matter hypodensity consistent with chronic small vessel ischemic change. 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 Small old left lacunar infarct in the lentiform nucleus. Vascular: No hyperdense vessel or unexpected calcification.  Skull: Normal. Negative for fracture or focal lesion. Sinuses/Orbits: No acute finding. Other: None. IMPRESSION: No acute intracranial abnormality. Electronically Signed   By: Audie Pinto M.D.   On: 03/08/2020 18:26   CT CHEST WO CONTRAST  Result Date: 03/05/2020 CLINICAL DATA:  Pneumonia, effusion or abscess suspected, xray done Dyspnea, chronic, unclear etiology EXAM: CT CHEST WITHOUT CONTRAST TECHNIQUE: Multidetector CT imaging of the chest was performed following the standard protocol without IV contrast. COMPARISON:  Most recent radiograph yesterday. No prior CT available. FINDINGS: Cardiovascular: Atherosclerosis of the thoracic aorta. Ectatic ascending aorta at 3.9 cm without frank aneurysm. Conventional branching pattern from the aortic arch. Multi chamber cardiomegaly. There are coronary artery calcifications. No pericardial effusion. Mediastinum/Nodes: Enlarged right lower paratracheal node at 12 mm. Additional shoddy mediastinal lymph nodes which are subcentimeter. Limited assessment for hilar adenopathy given lack of IV contrast. No esophageal wall thickening. No suspicious thyroid nodule. Lungs/Pleura: Multifocal ground-glass nodularity and consolidation throughout both lungs, right greater than left. Air bronchograms are more prominent on the right. Dense consolidation in the dependent right lower lobe and central right middle and lower lobes no left pleural effusion. Suspect small right pleural effusion, but only  minimal pleural fluid compared to degree of consolidation. There is lower lobe bronchial thickening with areas of mucoid impaction and bronchial filling in the right lower lobe segmental bronchi. Upper Abdomen: Streak artifact from spinal fusion hardware partially obscures evaluation. There is also patient motion artifact. Small exophytic cyst from the upper right kidney. Postcholecystectomy. No definite acute finding. Musculoskeletal: Posterior fusion with interbody spacers involving the lower thoracic and upper lumbar spine. Plate and screw fixation of left clavicle. Left proximal humeral arthroplasty. No acute osseous abnormalities are seen. Multilevel degenerative change in the thoracic spine. IMPRESSION: 1. Multifocal ground-glass nodularity and consolidation throughout both lungs, right greater than left. Air bronchograms are more prominent on the right. Findings are consistent with multifocal pneumonia, including atypical viral infection. An element of pulmonary edema is also considered. 2. Dense consolidation in the dependent right lower lobe and central right middle and left lower lobes. There is lower lobe bronchial thickening with areas of mucoid impaction/bronchial filling in the right lower lobe. This may represent more confluent pneumonia or aspiration. 3. Possible small right pleural effusion, but only minimal pleural fluid. 4. Cardiomegaly.  Coronary artery calcifications. 5. Enlarged right lower paratracheal node is likely reactive. Aortic Atherosclerosis (ICD10-I70.0). Electronically Signed   By: Keith Rake M.D.   On: 03/05/2020 12:35   MR BRAIN WO CONTRAST  Result Date: 03/03/2020 CLINICAL DATA:  Encephalopathy EXAM: MRI HEAD WITHOUT CONTRAST TECHNIQUE: Multiplanar, multiecho pulse sequences of the brain and surrounding structures were obtained without intravenous contrast. COMPARISON:  06/05/2019. FINDINGS: Brain: There is no acute infarction or intracranial hemorrhage. There is no  intracranial mass, mass effect, or edema. There is no hydrocephalus or extra-axial fluid collection. Patchy foci of T2 hyperintensity in the supratentorial and pontine white matter are nonspecific but may reflect mild chronic microvascular ischemic changes similar to the prior study. Chronic small vessel infarcts of bilateral basal ganglia are noted with new involvement of the right caudate. Vascular: Major vessel flow voids at the skull base are preserved. Skull and upper cervical spine: Normal marrow signal is preserved. Sinuses/Orbits: Trace mucosal thickening.  No acute orbital finding. Other: Sella is unremarkable. Patchy right mastoid fluid opacification. IMPRESSION: No acute infarction, hemorrhage, or mass. Mild chronic microvascular ischemic changes. Chronic small vessel infarcts of basal ganglia with new involvement of right  caudate. Electronically Signed   By: Macy Mis M.D.   On: 03/03/2020 16:31   US RENAL  Result Date: 03/04/2020 CLINICAL DATA:  Oliguria EXAM: RENAL / URINARY TRACT ULTRASOUND COMPLETE COMPARISON:  None. FINDINGS: Right Kidney: Renal measurements: 9.0 x 5.6 x 5.7 cm. = volume: 151 mL. Increased echogenicity is noted. No mass lesion or hydronephrosis is seen. Left Kidney: Renal measurements: 10.1 x 5.9 x 4.8 cm = volume: 149 mL. 1.1 cm cyst is noted in the upper pole. Cortical thinning and increased echogenicity is noted. No mass lesion or hydronephrosis is seen. Bladder: Decompressed by Foley catheter. Other: None. IMPRESSION: Increased echogenicity. Small left renal cyst. No obstructive changes are noted. Electronically Signed   By: Inez Catalina M.D.   On: 03/04/2020 16:41   DG CHEST PORT 1 VIEW  Result Date: 03/04/2020 CLINICAL DATA:  Increasing shortness of breath EXAM: PORTABLE CHEST 1 VIEW COMPARISON:  Yesterday FINDINGS: Patchy bilateral airspace disease most confluent in the right upper lobe where there has been definite progression. No visible effusion or  pneumothorax. Chronic cardiomegaly. Mediastinal contours are distorted by rightward rotation. Extensive spinal fusion and postoperative left shoulder girdle. IMPRESSION: Worsening multifocal pneumonia. Electronically Signed   By: Monte Fantasia M.D.   On: 03/04/2020 04:11   DG CHEST PORT 1 VIEW  Result Date: 03/03/2020 CLINICAL DATA:  Shortness of breath. EXAM: PORTABLE CHEST 1 VIEW COMPARISON:  Chest x-ray from yesterday. FINDINGS: The patient is rotated to the right. The heart size and mediastinal contours are within normal limits. Normal pulmonary vascularity. Similar appearing peribronchial thickening with increasing opacity and interstitial prominence at the right lung base. No pneumothorax or large pleural effusion. No acute osseous abnormality. IMPRESSION: Increasing opacity at the right lung base could reflect atelectasis or pneumonia. Electronically Signed   By: Titus Dubin M.D.   On: 03/03/2020 11:22   DG Chest Portable 1 View  Result Date: 02/22/2020 CLINICAL DATA:  84 year old female with history of altered mental status. Urinary tract infection. EXAM: PORTABLE CHEST 1 VIEW COMPARISON:  Chest x-ray 10/18/2019. FINDINGS: Lung volumes are low. Mild diffuse peribronchial cuffing and areas of interstitial prominence throughout the lungs bilaterally, which may reflect bronchitis. No acute consolidative airspace disease. No definite pleural effusions. No evidence of pulmonary edema. Heart size is normal. Upper mediastinal contours are within normal limits. Aortic atherosclerosis. Status post ORIF in the distal left clavicle. Status post left shoulder arthroplasty. Orthopedic fixation hardware in the lower thoracic and lumbar spine, incompletely imaged. IMPRESSION: 1. Low lung volumes with findings suggestive of potential bronchitis. Electronically Signed   By: Vinnie Langton M.D.   On: 03/08/2020 15:20    Assessment and Plan:   ANTANIA ACKERS is a 84 y.o. female with a hx of chronic  diastolic HF, HTN, PAF (not on AC 2/2 to falls), anxiety/depression, ETOH abuse and psoriatic arthritis who is being seen today for the evaluation of Afib at the request of Dr. Alfredia Ferguson.  1. Afib RVR: has hx of PAF but not on anticoagulation 2/2 to concern for falls. No EKG on admission but appear she has mostly been in Afib with brief episodes of SR. Rates are currently elevated in the 130-140 range. Given we are unable to anticoagulate her will focus on rate control. Blood pressures have been soft at times. Will stop hydralazine to allow for more BB therapy. Does not appear that she is taking any PO meds at this time.  -- start metoprolol 5mg  IV q6hr while NPO  2. Sepsis/hypothermia: found with a core temp of 87.9 on admission. Temp improving. Now being treated for aspiration PNA.   3. AKI: Cr up to 3.83 today. Seen by nephrology, felt to be ATN 2/2 to sepsis/hypotension.   4. HTN: as above, will stop hydralazine to allow for BB therapy to be increased as needed.   5. Thrombocytopenia: lovenox stopped. Suspect 2/2 to sepsis. As above would not anticoagulate.   For questions or updates, please contact [facility] Please consult [email or web address] for contact info under   Signed, Reino Bellis, NP  03/05/2020 1:35 PM  I have personally seen and examined this patient. I agree with the assessment and plan as outlined above.  84 yo female with HTN, PAF (not on chronic anticoagulation due to falls), chronic diastolic CHF admitted with altered mental status and likely aspiration pneumonia. She is in rapid atrial fib.  Heart rates 140s.  She is NPO Labs reviewed by me. No EKG to review. My exam: Frail elderly female. Irreg irreg tachy. Lungs clear. No LE edema. Plan: Atrial fib with RVR: She has chronic AF. Rate now up in the setting of dehydration and pneumonia. Will start IV Lopressor 5 mg IV every six hours. No anti-coagulation as she has not been felt to be a candidate for long term  anticoagulation per prior notes.  We will follow with you.   Lauree Chandler 03/05/2020 2:39 PM

## 2020-03-05 NOTE — Patient Outreach (Signed)
[facility]) Care Management  03/05/2020  ZAKAIYA GROSSMAN 1936-03-06 [id number]   Voice message received from [facility] daughter stating that member was admitted to the hospital.  Member's case was closed on 3/11 due to inability to maintain contact.  Hospital liaisons notified of admission and need for new referral pending disposition.  [name], [place], MSN [place] [phone]

## 2020-03-05 NOTE — Progress Notes (Signed)
Patient has left hand PIV. Had to stop maintenance fluid( D5) and IV antibiotic (Unasyn) not compatible with amiodarone bolus and drip. Notified pharmacy to reschedule and send new bag.

## 2020-03-05 NOTE — Progress Notes (Signed)
PROGRESS NOTE    [name] [name]  NAT:[id number] DOB: 1936-03-26 DOA: 02/28/2020 PCP: [name], MD  Brief Narrative:  HPI per Dr. Fuller Plan on 03/01/2020 [name] [name] is a 84 y.o. female with medical history significant of diastolic CHF, paroxysmal atrial fibrillation, CKD, hypothyroidism, and dementia.  History is obtained from review of records and daughter over the phone.  At baseline patient ambulates with the use of a walker and normally sleeps downstairs on the couch.  For the last 2 days she had not been able to get up off of the couch normal.  Her daughter had placed a potty chair beside her, but noted that she was unable to get up and ultimately placed an adult brief on her.  She had not been wanting to eat or drink much despite her daughter trying.  This morning when she awoke the patient was not able to speak.  The patient's daughter appears to manage her medications and had not had any recent changes.  A friend reportedly came over and called EMS.  Patient currently able to talk, but does not report any complaints at this time.  She is unaware of why she is here in the hospital.  Daughter noted similar episode with the patient not being able to walk back in November 2020 where she had to go to collapse for rehab.  During that hospitalization as well it appears she met SIRS criteria, but did not require any antibiotics as no clear source of infection was found.  EMS found the patient in her bed that was in a sun room for insulation soiled with urine, food crumbs caked around her mouth, and with dry mucous membranes.  Her initial temperature was checked and she was noted to be hypothermic.   ED Course: Upon admission into the emergency department patient was noted to be 87.9, pulse 53-58, aspirations 18-22, blood pressure 156/64-188/81, and O2 saturations maintained on room air.  Labs significant for WBC 10.7, BUN 57, creatinine 1.38, CK 50, lactic acid 1.4.  Chest x-ray showed  possible signs of bronchitis but was of low lung volumes.  Urinalysis did not show any significant signs of infection.  Patient was given 1 L normal saline IV fluids and placed on a bear hugger.  TRH called to admit.  **Interim History Patient's temperature has improved however she remains encephalopathic and overnight she wondered A. fib with RVR.  Renal function is worsening cardiology is consulted for assistance with management of heart rate.  Nephrology was also consulted for her acute kidney injury.  Her labs seem to be worsening and she was started on D5W.  Patient has a high risk for decompensation so we will consult palliative care for further goals of care discussion given that she has uncontrolled heart rate still.  Assessment & Plan:   Principal Problem:   Hypothermia Active Problems:   Hypertension   IBS (irritable bowel syndrome)   Hypothyroidism   Weakness   Chronic diastolic congestive heart failure (HCC)   Dementia (HCC)   SIRS (systemic inflammatory response syndrome) (HCC)   DNR (do not resuscitate)  Hypothermia, SIRS: Acute.  -Pt's initial rectal temperature was noted to be 87.9 F with WBC elevated at 10.7.  -A admission, lactic acid was 1.  -CXR noted possible bronchitis -UA did not show significant signs of infection.  -Pt was placed on empiric hugger -On 3/22: Bld Cx, UCx pending/NTD -Procal is negative -CRP is 6.4, WBC have decreased to 7.5, temp is improving;  sed reate is slightly elevated for her age at 21 -TSH is normal, but check FT4 and -3/23: FT4 is ok; temp is 98.4 oral; etiology still unknown; check cortisol  Acute metabolic encephalopathy w/ history of dementia -Family had reported the patient was not able to walk over the last 2 days and had developed acute aphasia  -Pt has symptoms secondary to severe hyperthermia as pt's bed is in a sun room with poor insulation  -Pt able to talk after being placed on bear hugger on physical exam at  admission, but not able to give any significant details regarding on why she here in the hospital. -CT Head was negative  -MRI is negative; she is more alert today and interactive still remains significantly confused  Lower extremity weakness, Acute. -Patient reportedly not able to get up and walk with use of a walker as previously done over the last 2 days -On 3/23: MRI is negative; PT/OT to see when more stable  Diastolic CHF, Chronic.  -Last EF noted to be 60 to 65% without significant wall motion abnormalities appreciated in 09/2018.  -Pt was euvolemic at admission but now there is some question about some pulmonary edema -Strict I&Os, daily wts -Restart furosemide when medically appropriate but this is not appropriate at this time given her worsening renal function -Today SCR is increased and UOP is down; she's had 2 IV doses of lasix in the last 24 hrs that are twice the amount of what she normally has; will check urine studies and add some fluids and have changed to D5 W at 75 mL's per hour  Essential Hypertension but now hypotensive -On admission blood pressures elevated up to 188/81.  -Home blood pressure medications include hydralazine 100 mg twice daily, furosemide 20 mg daily, and metoprolol 75 mg daily. -Blood pressure is low in the setting of A. fib with RVR next-continue with metoprolol tartrate 5 mg IV every 6 hours and her hydralazine has now been stopped  Hypothyroidism -Last TSH noted to be 2.658 on 10/18/2019 -TSH is 0.861; FT4 ok -Continue levothyroxine 100 mcg if able and if she is not taking p.o. we will need to change IV  Depression; Chronic. -Continue Prozac if able to take po   Irritable bowel syndrome; Chronic.  -Pt is on hyoscyamine for treatment; continue  Insomina -At home patient had been taking 10 mg of Ambien and 200 mg of Seroquel nightly -Continue Seroquel and reduce Ambien to 5 mg if able to take po   Thrombocytopenia -like as a  inflammatory response rxn; monitor -change lovenox to SCDs for now -Patient's platelet count is now 72,000 -Continue to monitor for signs and symptoms of bleeding; currently no overt bleeding noted  Paroxysmal atrial fibrillation with RVR -Went into RVR with bursts that were sustained -Poor anticoagulation candidate despite having a CHA2DS2-VASc score of at least 5 -Continue aspirin for now but patient is n.p.o. -Because the patient had some uncontrolled heart rates cardiology is consulted and they recommended scheduling metoprolol every 6 -Patient in the evening became hypotensive and had heart rate sustained in the 160s so I spoke with cardiology again they recommended amiodarone drip with a slow low bolus  -Continue amiodarone for now given that we cannot really use digoxin given her renal function and I have discontinued this after more -Repeat echocardiogram -Appreciate cardiology evaluation  Acute hypoxic respiratory failure Aspiration -SLP recommending NPO xstill  -Currently remains on a nonrebreather as she was previously on the BiPAP -episode of N/V this  AM; possible aspiration, CXR c/w that, start Unasyn, breathing Tx -C/w aspiration precautions, required BiPap overnight, wean as able, needs nausea control -Continue to treat and continue with pulmonary hygiene as well as Xopenex/Atrovent -Repeat chest x-ray in a.m. -If worsening may need to consult pulmonary Palliative care consulted for further goals of care discussion  AKI on mild CKD Elevated anion gap Metabolic Acidosis Hypernatremia, mild Hyperkalemia -Worsening as patient's BUN and creatinine have worsened all the way to 91/3.83 -checked urine studies, renal US, give fluids, watch nephrotoxins -Nephrology consulted for further evaluation recommendations -Nephrology recommending to check UPC but did not clinically feel that appears like acute glomerulonephritis  -Renal ultrasound was unrevealing -She is also given  albumin 12.5 mg and is currently on D5 +5 mL's per hour given her hyponatremia -There trying a small volume challenge given that the patient appeared clinically dry but they were to be judicious given questionable pulmonary edema  -They feel that this is infective than that this is hemodynamically mediated ATN in the setting of sepsis and the patient no longer a candidate for dialysis with her dementia poor functional status -Patient's hydralazine has been reduced to 50 mg p.o. twice daily to prevent further hypotension  Obesity -Estimated body mass index is 33.32 kg/m as calculated from the following:   Height as of this encounter: 5' 1"  (1.549 m).   Weight as of this encounter: 80 kg. -Weight Loss and Dietary Counseling given   GOC: DNR, poA   DVT prophylaxis:  Code Status: DO NOT RESUSCITATE  Family Communication: (Specify name, relationship & date discussed. NO "discussed with patient") Disposition Plan: (specify when and where you expect patient to be discharged). Include barriers to DC in this tab.   Consultants:   Cardiology  Nephrology  Palliative Care Medicine    Procedures:  ECHOCARDIOGRAM IMPRESSIONS    1. Left ventricular ejection fraction, by estimation, is 60 to 65%. The  left ventricle has normal function. The left ventricle has no regional  wall motion abnormalities. There is severe left ventricular hypertrophy.  Left ventricular diastolic parameters  are indeterminate.  2. Right ventricular systolic function was not well visualized. The right  ventricular size is normal. There is severely elevated pulmonary artery  systolic pressure.  3. The mitral valve is normal in structure. Mild mitral valve  regurgitation. No evidence of mitral stenosis.  4. Tricuspid valve regurgitation is moderate.  5. The aortic valve is tricuspid. Aortic valve regurgitation is not  visualized. Mild to moderate aortic valve sclerosis/calcification is  present, without any  evidence of aortic stenosis.  6. The inferior vena cava is normal in size with greater than 50%  respiratory variability, suggesting right atrial pressure of 3 mmHg.   FINDINGS  Left Ventricle: Left ventricular ejection fraction, by estimation, is 60  to 65%. The left ventricle has normal function. The left ventricle has no  regional wall motion abnormalities. The left ventricular internal cavity  size was normal in size. There is  severe left ventricular hypertrophy. Left ventricular diastolic  parameters are indeterminate.   Right Ventricle: The right ventricular size is normal. Right vetricular  wall thickness was not assessed. Right ventricular systolic function was  not well visualized. There is severely elevated pulmonary artery systolic  pressure. The tricuspid regurgitant  velocity is 3.79 m/s, and with an assumed right atrial pressure of 15  mmHg, the estimated right ventricular systolic pressure is 30.8 mmHg.   Left Atrium: Left atrial size was normal in size.  Right Atrium: Right atrial size was normal in size.   Pericardium: Trivial pericardial effusion is present. The pericardial  effusion is posterior to the left ventricle.   Mitral Valve: The mitral valve is normal in structure. There is mild  thickening of the mitral valve leaflet(s). There is mild calcification of  the mitral valve leaflet(s). Normal mobility of the mitral valve leaflets.  Mild mitral annular calcification.  Mild mitral valve regurgitation. No evidence of mitral valve stenosis.   Tricuspid Valve: The tricuspid valve is normal in structure. Tricuspid  valve regurgitation is moderate . No evidence of tricuspid stenosis.   Aortic Valve: The aortic valve is tricuspid. Aortic valve regurgitation is  not visualized. Mild to moderate aortic valve sclerosis/calcification is  present, without any evidence of aortic stenosis. Aortic valve mean  gradient measures 3.0 mmHg. Aortic  valve peak gradient  measures 4.5 mmHg. Aortic valve area, by VTI measures  2.29 cm.   Pulmonic Valve: The pulmonic valve was normal in structure. Pulmonic valve  regurgitation is not visualized. No evidence of pulmonic stenosis.   Aorta: The aortic root is normal in size and structure.   Venous: The inferior vena cava is normal in size with greater than 50%  respiratory variability, suggesting right atrial pressure of 3 mmHg.   IAS/Shunts: No atrial level shunt detected by color flow Doppler.     LEFT VENTRICLE  PLAX 2D  LVIDd:     2.70 cm  LVIDs:     1.80 cm  LV PW:     1.70 cm  LV IVS:    1.80 cm  LVOT diam:   1.80 cm  LV SV:     45  LV SV Index:  25  LVOT Area:   2.54 cm     RIGHT VENTRICLE      IVC  RV Basal diam: 2.90 cm  IVC diam: 2.00 cm  RV S prime:   7.83 cm/s  TAPSE (M-mode): 1.8 cm   LEFT ATRIUM       Index    RIGHT ATRIUM      Index  LA diam:    3.30 cm 1.84 cm/m RA Area:   12.90 cm  LA Vol (A2C):  82.6 ml 46.13 ml/m RA Volume:  29.80 ml 16.64 ml/m  LA Vol (A4C):  72.6 ml 40.55 ml/m  LA Biplane Vol: 77.4 ml 43.23 ml/m  AORTIC VALVE  AV Area (Vmax):  2.21 cm  AV Area (Vmean):  2.08 cm  AV Area (VTI):   2.29 cm  AV Vmax:      105.60 cm/s  AV Vmean:     82.740 cm/s  AV VTI:      0.199 m  AV Peak Grad:   4.5 mmHg  AV Mean Grad:   3.0 mmHg  LVOT Vmax:     91.70 cm/s  LVOT Vmean:    67.720 cm/s  LVOT VTI:     0.179 m  LVOT/AV VTI ratio: 0.90    AORTA  Ao Root diam: 2.80 cm  Ao Asc diam: 3.60 cm   TRICUSPID VALVE  TR Peak grad:  57.5 mmHg  TR Vmax:    379.00 cm/s    SHUNTS  Systemic VTI: 0.18 m  Systemic Diam: 1.80 cm    Antimicrobials:  Anti-infectives (From admission, onward)   Start     Dose/Rate Route Frequency Ordered Stop   03/04/20 0500  Ampicillin-Sulbactam (UNASYN) 3 g in sodium chloride 0.9 % 100 mL IVPB  3 g 200 mL/hr over 30  Minutes Intravenous Every 12 hours 03/03/20 1431     03/03/20 1500  Ampicillin-Sulbactam (UNASYN) 3 g in sodium chloride 0.9 % 100 mL IVPB     3 g 200 mL/hr over 30 Minutes Intravenous  Once 03/03/20 1431 03/03/20 1717     Subjective: Seen and examined at bedside and she remains a little bit confused and remains on the nonrebreather.  Respiratory status is tenuous however she continues have bursts and be in A. fib with RVR.  Denies any current pain.  No other concerns or complaints at this time.  Objective: Vitals:   03/05/20 2009 03/05/20 2100 03/05/20 2200 03/05/20 2223  BP:  (!) 111/57 (!) 83/66 115/75  Pulse: (!) 130 (!) 121 66 (!) 118  Resp: (!) 22 (!) 22 (!) 25 (!) 26  Temp:      TempSrc:      SpO2:  98% 97% 93%  Weight:      Height:        Intake/Output Summary (Last 24 hours) at 03/05/2020 2227 Last data filed at 03/05/2020 1856 Gross per 24 hour  Intake 1386.41 ml  Output 350 ml  Net 1036.41 ml   Filed Weights   03/03/20 0501 03/04/20 0408 03/05/20 0410  Weight: 78.5 kg 76.8 kg 80 kg   Examination: Physical Exam:  Constitutional: WN/WD obese Caucasian female currently in some respiratory distress appears somewhat uncomfortable on a nonrebreather Eyes: Lids and conjunctivae normal, sclerae anicteric  ENMT: External Ears, Nose appear normal. Grossly normal hearing. .  Neck: Appears normal, supple, no cervical masses, normal ROM, no appreciable thyromegaly; no JVD Respiratory: Diminished to auscultation bilaterally with coarse breath sounds with some rhonchi and crackles; has increased respiratory rate and she is wearing a nonrebreather Cardiovascular: Irregularly irregular tachycardic, no appreciable murmurs, rubs, gallops.  Has 1+ lower extremity edema Abdomen: Soft, non-tender, distended secondary body habitus. Bowel sounds positive.  GU: Deferred. Musculoskeletal: No clubbing / cyanosis of digits/nails. No joint deformity upper and lower extremities.  Skin: No  rashes, lesions, ulcers on limited skin evaluation. No induration; Warm and dry.  Neurologic: CN 2-12 grossly intact with no focal deficits. Romberg sign cerebellar reflexes not assessed.  Psychiatric: Impaired judgment and insight. Alert and oriented x 1.  Depressed appearing and flat mood and affect.  Data Reviewed: I have personally reviewed following labs and imaging studies  CBC: Recent Labs  Lab 03/03/2020 1359 03/03/20 0351 03/04/20 0706 03/05/20 0314  WBC 10.7* 7.5 18.8* 14.4*  NEUTROABS 10.1*  --  17.1* 12.9*  HGB 12.8 10.1* 10.0* 10.0*  HCT 39.2 31.2* 31.5* 32.6*  MCV 92.7 93.7 96.9 100.9*  PLT 66* 63* 68* 72*   Basic Metabolic Panel: Recent Labs  Lab 02/15/2020 1359 03/03/20 0351 03/04/20 0706 03/05/20 0314  NA 137 140 146* 146*  K 4.7 4.8 5.1 5.5*  CL 100 107 110 111  CO2 18* 16* 20* 18*  GLUCOSE 115* 69* 133* 125*  BUN 57* 55* 72* 91*  CREATININE 1.38* 2.02* 3.08* 3.83*  CALCIUM 8.6* 7.9* 8.5* 9.0  MG  --   --  2.0 2.4   GFR: Estimated Creatinine Clearance: 11 mL/min (A) (by C-G formula based on SCr of 3.83 mg/dL (H)). Liver Function Tests: Recent Labs  Lab 03/12/2020 1359 03/04/20 0706 03/05/20 0314  AST 50* 27 86*  ALT 39 24 44  ALKPHOS 125 83 146*  BILITOT 1.3* 1.2 1.3*  PROT 6.9 5.8* 6.2*  ALBUMIN 3.2* 2.5* 2.5*  No results for input(s): LIPASE, AMYLASE in the last 168 hours. No results for input(s): AMMONIA in the last 168 hours. Coagulation Profile: Recent Labs  Lab 03/03/2020 1359  INR 1.0   Cardiac Enzymes: Recent Labs  Lab 02/19/2020 1359  CKTOTAL 50   BNP (last 3 results) No results for input(s): PROBNP in the last 8760 hours. HbA1C: No results for input(s): HGBA1C in the last 72 hours. CBG: No results for input(s): GLUCAP in the last 168 hours. Lipid Profile: No results for input(s): CHOL, HDL, LDLCALC, TRIG, CHOLHDL, LDLDIRECT in the last 72 hours. Thyroid Function Tests: Recent Labs    03/03/20 0351  TSH 0.861  FREET4  0.79   Anemia Panel: No results for input(s): VITAMINB12, FOLATE, FERRITIN, TIBC, IRON, RETICCTPCT in the last 72 hours. Sepsis Labs: Recent Labs  Lab 03/12/2020 1359 02/25/2020 1548  PROCALCITON <0.10  --   LATICACIDVEN 1.4 1.0    Recent Results (from the past 240 hour(s))  Culture, blood (Routine x 2)     Status: None (Preliminary result)   Collection Time: 02/14/2020  1:59 PM   Specimen: BLOOD  Result Value Ref Range Status   Specimen Description BLOOD BLOOD LEFT HAND  Final   Special Requests   Final    BOTTLES DRAWN AEROBIC AND ANAEROBIC Blood Culture results may not be optimal due to an inadequate volume of blood received in culture bottles   Culture   Final    NO GROWTH 3 DAYS Performed at [facility], [place] [address], [place], [place] [address]    Report Status PENDING  Incomplete  Urine culture     Status: Abnormal   Collection Time: 02/29/2020  1:59 PM   Specimen: Urine, Random  Result Value Ref Range Status   Specimen Description URINE, RANDOM  Final   Special Requests   Final    NONE Performed at [facility], [place] [address], [place], [place] [address]    Culture MULTIPLE SPECIES PRESENT, SUGGEST RECOLLECTION (A)  Final   Report Status 03/03/2020 FINAL  Final  Culture, blood (Routine x 2)     Status: None (Preliminary result)   Collection Time: 03/12/2020  3:48 PM   Specimen: BLOOD RIGHT WRIST  Result Value Ref Range Status   Specimen Description BLOOD RIGHT WRIST  Final   Special Requests   Final    BOTTLES DRAWN AEROBIC AND ANAEROBIC Blood Culture results may not be optimal due to an excessive volume of blood received in culture bottles   Culture   Final    NO GROWTH 3 DAYS Performed at [facility], [place] [address], [place], [place] [address]    Report Status PENDING  Incomplete  SARS CORONAVIRUS 2 (TAT 6-24 HRS) Nasopharyngeal Nasopharyngeal Swab     Status: None   Collection Time: 02/24/2020  4:44 PM   Specimen: Nasopharyngeal Swab  Result  Value Ref Range Status   SARS Coronavirus 2 NEGATIVE NEGATIVE Final    Comment: (NOTE) SARS-CoV-2 target nucleic acids are NOT DETECTED. The SARS-CoV-2 RNA is generally detectable in upper and lower respiratory specimens during the acute phase of infection. Negative results do not preclude SARS-CoV-2 infection, do not rule out co-infections with other pathogens, and should not be used as the sole basis for treatment or other patient management decisions. Negative results must be combined with clinical observations, patient history, and epidemiological information. The expected result is Negative. Fact Sheet for Patients: SugarRoll.be Fact Sheet for Healthcare Providers: https://www.woods-mathews.com/ This  test is not yet approved or cleared by the [place] and  has been authorized for detection and/or diagnosis of SARS-CoV-2 by FDA under an Emergency Use Authorization (EUA). This EUA will remain  in effect (meaning this test can be used) for the duration of the COVID-19 declaration under Section 56 4(b)(1) of the Act, 21 U.S.C. section 360bbb-3(b)(1), unless the authorization is terminated or revoked sooner. Performed at [facility], [place] [address], [place], [place] [address]      RN Pressure Injury Documentation:     Estimated body mass index is 33.32 kg/m as calculated from the following:   Height as of this encounter: 5' 1"  (1.549 m).   Weight as of this encounter: 80 kg.  Malnutrition Type:      Malnutrition Characteristics:      Nutrition Interventions:      Radiology Studies: DG Abd 1 View  Result Date: 03/04/2020 CLINICAL DATA:  Nausea and vomiting. EXAM: ABDOMEN - 1 VIEW COMPARISON:  CT 10/18/2019.  Abdomen 03/11/2017. FINDINGS: Surgical clips and staples again noted over the abdomen. No bowel distention or free air. Thoracolumbar and lumbosacral spine fusion again noted. Hardware intact. Right base  atelectasis/infiltrate. IMPRESSION: 1.  No acute intra-abdominal abnormality identified. 2.  Right base atelectasis/infiltrate cannot be excluded Electronically Signed   By: Marcello Moores  Register   On: 03/04/2020 08:00   CT CHEST WO CONTRAST  Result Date: 03/05/2020 CLINICAL DATA:  Pneumonia, effusion or abscess suspected, xray done Dyspnea, chronic, unclear etiology EXAM: CT CHEST WITHOUT CONTRAST TECHNIQUE: Multidetector CT imaging of the chest was performed following the standard protocol without IV contrast. COMPARISON:  Most recent radiograph yesterday. No prior CT available. FINDINGS: Cardiovascular: Atherosclerosis of the thoracic aorta. Ectatic ascending aorta at 3.9 cm without frank aneurysm. Conventional branching pattern from the aortic arch. Multi chamber cardiomegaly. There are coronary artery calcifications. No pericardial effusion. Mediastinum/Nodes: Enlarged right lower paratracheal node at 12 mm. Additional shoddy mediastinal lymph nodes which are subcentimeter. Limited assessment for hilar adenopathy given lack of IV contrast. No esophageal wall thickening. No suspicious thyroid nodule. Lungs/Pleura: Multifocal ground-glass nodularity and consolidation throughout both lungs, right greater than left. Air bronchograms are more prominent on the right. Dense consolidation in the dependent right lower lobe and central right middle and lower lobes no left pleural effusion. Suspect small right pleural effusion, but only minimal pleural fluid compared to degree of consolidation. There is lower lobe bronchial thickening with areas of mucoid impaction and bronchial filling in the right lower lobe segmental bronchi. Upper Abdomen: Streak artifact from spinal fusion hardware partially obscures evaluation. There is also patient motion artifact. Small exophytic cyst from the upper right kidney. Postcholecystectomy. No definite acute finding. Musculoskeletal: Posterior fusion with interbody spacers involving the  lower thoracic and upper lumbar spine. Plate and screw fixation of left clavicle. Left proximal humeral arthroplasty. No acute osseous abnormalities are seen. Multilevel degenerative change in the thoracic spine. IMPRESSION: 1. Multifocal ground-glass nodularity and consolidation throughout both lungs, right greater than left. Air bronchograms are more prominent on the right. Findings are consistent with multifocal pneumonia, including atypical viral infection. An element of pulmonary edema is also considered. 2. Dense consolidation in the dependent right lower lobe and central right middle and left lower lobes. There is lower lobe bronchial thickening with areas of mucoid impaction/bronchial filling in the right lower lobe. This may represent more confluent pneumonia or aspiration. 3. Possible small right pleural effusion, but only minimal pleural fluid. 4. Cardiomegaly.  Coronary artery calcifications. 5. Enlarged right lower paratracheal node is likely reactive. Aortic Atherosclerosis (ICD10-I70.0). Electronically Signed   By: Keith Rake M.D.   On: 03/05/2020 12:35   US RENAL  Result Date: 03/04/2020 CLINICAL DATA:  Oliguria EXAM: RENAL / URINARY TRACT ULTRASOUND COMPLETE COMPARISON:  None. FINDINGS: Right Kidney: Renal measurements: 9.0 x 5.6 x 5.7 cm. = volume: 151 mL. Increased echogenicity is noted. No mass lesion or hydronephrosis is seen. Left Kidney: Renal measurements: 10.1 x 5.9 x 4.8 cm = volume: 149 mL. 1.1 cm cyst is noted in the upper pole. Cortical thinning and increased echogenicity is noted. No mass lesion or hydronephrosis is seen. Bladder: Decompressed by Foley catheter. Other: None. IMPRESSION: Increased echogenicity. Small left renal cyst. No obstructive changes are noted. Electronically Signed   By: Inez Catalina M.D.   On: 03/04/2020 16:41   DG CHEST PORT 1 VIEW  Result Date: 03/04/2020 CLINICAL DATA:  Increasing shortness of breath EXAM: PORTABLE CHEST 1 VIEW COMPARISON:   Yesterday FINDINGS: Patchy bilateral airspace disease most confluent in the right upper lobe where there has been definite progression. No visible effusion or pneumothorax. Chronic cardiomegaly. Mediastinal contours are distorted by rightward rotation. Extensive spinal fusion and postoperative left shoulder girdle. IMPRESSION: Worsening multifocal pneumonia. Electronically Signed   By: Monte Fantasia M.D.   On: 03/04/2020 04:11   ECHOCARDIOGRAM COMPLETE  Result Date: 03/05/2020    ECHOCARDIOGRAM REPORT   Patient Name:   [name] [name] Date of Exam: 03/05/2020 Medical Rec #:  [id number]     Height:       61.0 in Accession #:    [id number]    Weight:       176.4 lb Date of Birth:  1936/05/18     BSA:          1.790 m Patient Age:    42 years      BP:           132/53 mmHg Patient Gender: F             HR:           83 bpm. Exam Location:  Inpatient Procedure: 2D Echo, Cardiac Doppler and Color Doppler Indications:    Dyspnea 786.09/R06.00  History:        Patient has prior history of Echocardiogram examinations, most                 recent 09/18/2018. CHF, Arrythmias:Atrial Fibrillation; Risk                 Factors:Hypertension and Dyslipidemia. ETOH abuse.  Sonographer:    Clayton Lefort RDCS (AE) Referring Phys: [id number] [facility] LATIF [facility]  Sonographer Comments: Patient is morbidly obese and suboptimal apical window. Image acquisition challenging due to patient body habitus. Limited ability to position patient. IMPRESSIONS  1. Left ventricular ejection fraction, by estimation, is 60 to 65%. The left ventricle has normal function. The left ventricle has no regional wall motion abnormalities. There is severe left ventricular hypertrophy. Left ventricular diastolic parameters  are indeterminate.  2. Right ventricular systolic function was not well visualized. The right ventricular size is normal. There is severely elevated pulmonary artery systolic pressure.  3. The mitral valve is normal in structure. Mild mitral valve  regurgitation. No evidence of mitral stenosis.  4. Tricuspid valve regurgitation is moderate.  5. The aortic valve is tricuspid. Aortic valve regurgitation is not visualized. Mild to moderate aortic valve sclerosis/calcification is present, without any evidence  of aortic stenosis.  6. The inferior vena cava is normal in size with greater than 50% respiratory variability, suggesting right atrial pressure of 3 mmHg. FINDINGS  Left Ventricle: Left ventricular ejection fraction, by estimation, is 60 to 65%. The left ventricle has normal function. The left ventricle has no regional wall motion abnormalities. The left ventricular internal cavity size was normal in size. There is  severe left ventricular hypertrophy. Left ventricular diastolic parameters are indeterminate. Right Ventricle: The right ventricular size is normal. Right vetricular wall thickness was not assessed. Right ventricular systolic function was not well visualized. There is severely elevated pulmonary artery systolic pressure. The tricuspid regurgitant  velocity is 3.79 m/s, and with an assumed right atrial pressure of 15 mmHg, the estimated right ventricular systolic pressure is 11.0 mmHg. Left Atrium: Left atrial size was normal in size. Right Atrium: Right atrial size was normal in size. Pericardium: Trivial pericardial effusion is present. The pericardial effusion is posterior to the left ventricle. Mitral Valve: The mitral valve is normal in structure. There is mild thickening of the mitral valve leaflet(s). There is mild calcification of the mitral valve leaflet(s). Normal mobility of the mitral valve leaflets. Mild mitral annular calcification. Mild mitral valve regurgitation. No evidence of mitral valve stenosis. Tricuspid Valve: The tricuspid valve is normal in structure. Tricuspid valve regurgitation is moderate . No evidence of tricuspid stenosis. Aortic Valve: The aortic valve is tricuspid. Aortic valve regurgitation is not visualized. Mild  to moderate aortic valve sclerosis/calcification is present, without any evidence of aortic stenosis. Aortic valve mean gradient measures 3.0 mmHg. Aortic valve peak gradient measures 4.5 mmHg. Aortic valve area, by VTI measures 2.29 cm. Pulmonic Valve: The pulmonic valve was normal in structure. Pulmonic valve regurgitation is not visualized. No evidence of pulmonic stenosis. Aorta: The aortic root is normal in size and structure. Venous: The inferior vena cava is normal in size with greater than 50% respiratory variability, suggesting right atrial pressure of 3 mmHg. IAS/Shunts: No atrial level shunt detected by color flow Doppler.  LEFT VENTRICLE PLAX 2D LVIDd:         2.70 cm LVIDs:         1.80 cm LV PW:         1.70 cm LV IVS:        1.80 cm LVOT diam:     1.80 cm LV SV:         45 LV SV Index:   25 LVOT Area:     2.54 cm  RIGHT VENTRICLE            IVC RV Basal diam:  2.90 cm    IVC diam: 2.00 cm RV S prime:     7.83 cm/s TAPSE (M-mode): 1.8 cm LEFT ATRIUM             Index       RIGHT ATRIUM           Index LA diam:        3.30 cm 1.84 cm/m  RA Area:     12.90 cm LA Vol (A2C):   82.6 ml 46.13 ml/m RA Volume:   29.80 ml  16.64 ml/m LA Vol (A4C):   72.6 ml 40.55 ml/m LA Biplane Vol: 77.4 ml 43.23 ml/m  AORTIC VALVE AV Area (Vmax):    2.21 cm AV Area (Vmean):   2.08 cm AV Area (VTI):     2.29 cm AV Vmax:  105.60 cm/s AV Vmean:          82.740 cm/s AV VTI:            0.199 m AV Peak Grad:      4.5 mmHg AV Mean Grad:      3.0 mmHg LVOT Vmax:         91.70 cm/s LVOT Vmean:        67.720 cm/s LVOT VTI:          0.179 m LVOT/AV VTI ratio: 0.90  AORTA Ao Root diam: 2.80 cm Ao Asc diam:  3.60 cm TRICUSPID VALVE TR Peak grad:   57.5 mmHg TR Vmax:        379.00 cm/s  SHUNTS Systemic VTI:  0.18 m Systemic Diam: 1.80 cm Jenkins Rouge MD Electronically signed by Jenkins Rouge MD Signature Date/Time: 03/05/2020/5:01:47 PM    Final    Scheduled Meds: . aspirin EC  81 mg Oral q morning - 10a  .  chlordiazePOXIDE  5 mg Oral q morning - 10a  . Chlorhexidine Gluconate Cloth  6 each Topical Daily  . digoxin  0.25 mg Intravenous Daily  . FLUoxetine  20 mg Oral Daily  . folic acid  1 mg Oral Daily  . hyoscyamine  0.375 mg Oral BID  . ipratropium  0.5 mg Nebulization Q6H  . levalbuterol  0.63 mg Nebulization Q6H  . levothyroxine  100 mcg Oral Q0600  . metoprolol tartrate  5 mg Intravenous Q6H  . QUEtiapine  200 mg Oral QHS  . simvastatin  20 mg Oral QHS  . sodium chloride flush  3 mL Intravenous Q12H  . venlafaxine XR  37.5 mg Oral Q breakfast  . cyanocobalamin  1,000 mcg Oral Daily   Continuous Infusions: . sodium chloride 500 mL (03/05/20 2210)  . amiodarone 60 mg/hr (03/05/20 2156)   Followed by  . [START ON 03/06/2020] amiodarone    . ampicillin-sulbactam (UNASYN) IV 3 g (03/05/20 2213)  . dextrose 75 mL/hr at 03/05/20 2159    LOS: 2 days   Kerney Elbe, DO [facility] PAGER is on AMION  If 7PM-7AM, please contact night-coverage [email or web address]

## 2020-03-05 NOTE — Evaluation (Signed)
Clinical/Bedside Swallow Evaluation Patient Details  Name: [name] [name] MRN: [id number] Date of Birth: Jul 09, 1936  Today's Date: 03/05/2020 Time: SLP Start Time (ACUTE ONLY): 0948 SLP Stop Time (ACUTE ONLY): 1002 SLP Time Calculation (min) (ACUTE ONLY): 14 min  Past Medical History:  Past Medical History:  Diagnosis Date  . Alopecia   . Anemia    hx of years ago   . Anxiety   . ARF (acute renal failure) ([place]) 08/23/2014  . Arthritis    psoriatic arthritis  . Back pain   . Blood transfusion    at age of 4  . Carotid stenosis 12/2016  . Chronic diastolic CHF (congestive heart failure) ([place])   . Depression   . Dysrhythmia 11/13/2016   PAFib for a short time- Echo done 11/14/16 PAF- to follow up with PCP- to see if she is a candiate for anticoags.  Not started at times time due to alcohol abuse.  [name] GERD (gastroesophageal reflux disease)   . H/O hiatal hernia   . History of acute cholangitis   . History of GI diverticular bleed   . Hyperlipidemia   . Hypertension   . Hypothyroidism   . IBS (irritable bowel syndrome)   . Pancreatitis   . Peripheral vascular disease ([place])   . PONV (postoperative nausea and vomiting)    with Breast reduction- only time  . Rosacea   . Seizures ([place]) 11/13/2016   Thopught to be from withdrawl Benzodiazepine  . Stroke [facility])    patient denies-  . Thyroid disease    Past Surgical History:  Past Surgical History:  Procedure Laterality Date  . ABDOMINAL HYSTERECTOMY     partial  . BACK SURGERY  2010   thoractic-screws and rods  . BILE DUCT STENT PLACEMENT  08/26/2014  . BREAST REDUCTION SURGERY    . CHOLECYSTECTOMY  1988   lap  . COLONOSCOPY    . COLONOSCOPY Left 09/08/2014   Procedure: COLONOSCOPY;  Surgeon: Arta Silence, MD;  Location[facility] ENDOSCOPY;  Service: Endoscopy;  Laterality: Left;  . ENDARTERECTOMY Left 12/28/2016  . ENDARTERECTOMY Left 12/28/2016   Procedure: ENDARTERECTOMY CAROTID LEFT WITH XENOSURE BIOLOGIC PATCH ANGIOPLASTY;   Surgeon: Angelia Mould, MD;  Location: [place];  Service: Vascular;  Laterality: Left;  . ERCP    . ERCP N/A 08/26/2014   Procedure: ENDOSCOPIC RETROGRADE CHOLANGIOPANCREATOGRAPHY (ERCP);  Surgeon: Jeryl Columbia, MD;  Location: [facility] ENDOSCOPY;  Service: Endoscopy;  Laterality: N/A;  . ERCP N/A 09/11/2014   Procedure: ENDOSCOPIC RETROGRADE CHOLANGIOPANCREATOGRAPHY (ERCP);  Surgeon: Arta Silence, MD;  Location: [facility] ENDOSCOPY;  Service: Endoscopy;  Laterality: N/A;  . EUS N/A 09/11/2014   Procedure: ESOPHAGEAL ENDOSCOPIC ULTRASOUND (EUS) RADIAL;  Surgeon: Arta Silence, MD;  Location: [facility] ENDOSCOPY;  Service: Endoscopy;  Laterality: N/A;  . EYE SURGERY Bilateral    Cataract  . JOINT REPLACEMENT  2005   left shoulder replacement   . ORIF CLAVICULAR FRACTURE Left 07/18/2014   Procedure: OPEN REDUCTION INTERNAL FIXATION (ORIF) LEFT CLAVICULAR FRACTURE;  Surgeon: Ninetta Lights, MD;  Location: [place];  Service: Orthopedics;  Laterality: Left;  . SPYGLASS CHOLANGIOSCOPY N/A 09/11/2014   Procedure: SPYGLASS CHOLANGIOSCOPY;  Surgeon: Arta Silence, MD;  Location: [facility] ENDOSCOPY;  Service: Endoscopy;  Laterality: N/A;  . TONSILLECTOMY  as child  . TOTAL KNEE ARTHROPLASTY Left 03/10/2016   Procedure: LEFT TOTAL KNEE ARTHROPLASTY;  Surgeon: Ninetta Lights, MD;  Location: [place];  Service: Orthopedics;  Laterality: Left;  [name] VENTRAL HERNIA REPAIR  06/08/2012   Procedure: LAPAROSCOPIC VENTRAL HERNIA;  Surgeon: Adin Hector, MD;  Location: [facility] ORS;  Service: General;  Laterality: Right;   HPI:  ANTIONETTA ATOR is a 84 y.o. female with medical history significant of diastolic CHF, paroxysmal atrial fibrillation, CKD, hypothyroidism, and dementia.  For 2 days PTA she had not been able to get up off of the couch as normal.  Her daughter had placed a potty chair beside her, but noted that she was unable to get up and ultimately placed an adult brief on her.  She had not been wanting to eat or drink  much despite her daughter trying.  The morning of admission when she awoke the patient was not able to speak. A friend reportedly came over and called EMS. EMS found the patient in her bed that was in a sun room, hypothermic, soiled with urine, food crumbs caked around her mouth, and with dry mucous membranes.  She apparently had an episode of vomiting later in the morning and possibly aspirated, and then again in MRI documented to ahve asirated bile. CXR shows worsening right base opacity. MRI is negative.    Assessment / Plan / Recommendation Clinical Impression  Pt was seen for bedside swallow evaluation. She did not present as a reliable historian due to confusion and exhibited difficulty following commands for this same reason. Oral mucosa was dry with dried mucous/secretions. Oral care was provided by SLP using NPO oral care kit. She tolerated puree solids and ice chips without overt s/sx of aspiration; however, coughing was noted following intake of thin liquids via tsp, suggesting possible aspiration. It is recommended that the pt's NPO status be maintained at this time but any critical meds may be given crushed in applesauce. SLP will follow to assess improvement in swallow function and need for an instrumental assessment with any progression in respiratory function.  SLP Visit Diagnosis: Dysphagia, pharyngeal phase (R13.13)    Aspiration Risk  Mild aspiration risk;Moderate aspiration risk    Diet Recommendation NPO except meds   Medication Administration: Crushed with puree    Other  Recommendations Oral Care Recommendations: Oral care QID   Follow up Recommendations        Frequency and Duration            Prognosis Prognosis for Safe Diet Advancement: Good Barriers to Reach Goals: Cognitive deficits      Swallow Study   General Date of Onset: 03/03/20 HPI: [name] [name] is a 84 y.o. female with medical history significant of diastolic CHF, paroxysmal atrial fibrillation,  CKD, hypothyroidism, and dementia.  For 2 days PTA she had not been able to get up off of the couch as normal.  Her daughter had placed a potty chair beside her, but noted that she was unable to get up and ultimately placed an adult brief on her.  She had not been wanting to eat or drink much despite her daughter trying.  The morning of admission when she awoke the patient was not able to speak. A friend reportedly came over and called EMS. EMS found the patient in her bed that was in a sun room, hypothermic, soiled with urine, food crumbs caked around her mouth, and with dry mucous membranes.  She apparently had an episode of vomiting later in the morning and possibly aspirated, and then again in MRI documented to ahve asirated bile. CXR shows worsening right base opacity. MRI is negative.  Type of Study: Bedside Swallow Evaluation Previous Swallow  Assessment: None Diet Prior to this Study: NPO Temperature Spikes Noted: No Respiratory Status: Non-rebreather History of Recent Intubation: No Behavior/Cognition: Alert;Cooperative;Confused Oral Cavity Assessment: Dry;Dried secretions Oral Care Completed by SLP: Yes Oral Cavity - Dentition: Adequate natural dentition Vision: Functional for self-feeding Self-Feeding Abilities: Total assist Patient Positioning: Upright in bed;Postural control adequate for testing Baseline Vocal Quality: Normal Volitional Cough: Strong Volitional Swallow: Unable to elicit    Oral/Motor/Sensory Function Overall Oral Motor/Sensory Function: Other (comment)(UTA due to pt's difficulty following commands)   Ice Chips Ice chips: Within functional limits Presentation: Spoon   Thin Liquid Thin Liquid: Impaired Presentation: Spoon Pharyngeal  Phase Impairments: Cough - Immediate    Nectar Thick Nectar Thick Liquid: Not tested   Honey Thick Honey Thick Liquid: Not tested   Puree Puree: Within functional limits   Solid     Solid: Not tested     [name] [name] I. Hardin Negus,  [place], [place] Office number [phone] Pager [phone]  Horton Marshall 03/05/2020,1:09 PM

## 2020-03-06 ENCOUNTER — Inpatient Hospital Stay (HOSPITAL_COMMUNITY): Payer: Medicare Other

## 2020-03-06 DIAGNOSIS — J690 Pneumonitis due to inhalation of food and vomit: Secondary | ICD-10-CM

## 2020-03-06 DIAGNOSIS — Z515 Encounter for palliative care: Secondary | ICD-10-CM

## 2020-03-06 DIAGNOSIS — J9601 Acute respiratory failure with hypoxia: Secondary | ICD-10-CM | POA: Diagnosis present

## 2020-03-06 DIAGNOSIS — N179 Acute kidney failure, unspecified: Secondary | ICD-10-CM

## 2020-03-06 DIAGNOSIS — I4891 Unspecified atrial fibrillation: Secondary | ICD-10-CM

## 2020-03-06 DIAGNOSIS — N189 Chronic kidney disease, unspecified: Secondary | ICD-10-CM

## 2020-03-06 DIAGNOSIS — Z66 Do not resuscitate: Secondary | ICD-10-CM | POA: Diagnosis not present

## 2020-03-06 DIAGNOSIS — I5032 Chronic diastolic (congestive) heart failure: Secondary | ICD-10-CM

## 2020-03-06 DIAGNOSIS — R6510 Systemic inflammatory response syndrome (SIRS) of non-infectious origin without acute organ dysfunction: Secondary | ICD-10-CM

## 2020-03-06 DIAGNOSIS — R0682 Tachypnea, not elsewhere classified: Secondary | ICD-10-CM

## 2020-03-06 LAB — BLOOD GAS, ARTERIAL
Acid-base deficit: 3.4 mmol/L — ABNORMAL HIGH (ref 0.0–2.0)
Bicarbonate: 21.8 mmol/L (ref 20.0–28.0)
Drawn by: 548791
FIO2: 50
O2 Saturation: 94.1 %
Patient temperature: 37
pCO2 arterial: 43.9 mmHg (ref 32.0–48.0)
pH, Arterial: 7.317 — ABNORMAL LOW (ref 7.350–7.450)
pO2, Arterial: 74.4 mmHg — ABNORMAL LOW (ref 83.0–108.0)

## 2020-03-06 LAB — COMPREHENSIVE METABOLIC PANEL
ALT: 55 U/L — ABNORMAL HIGH (ref 0–44)
AST: 96 U/L — ABNORMAL HIGH (ref 15–41)
Albumin: 2.5 g/dL — ABNORMAL LOW (ref 3.5–5.0)
Alkaline Phosphatase: 227 U/L — ABNORMAL HIGH (ref 38–126)
Anion gap: 15 (ref 5–15)
BUN: 105 mg/dL — ABNORMAL HIGH (ref 8–23)
CO2: 22 mmol/L (ref 22–32)
Calcium: 9 mg/dL (ref 8.9–10.3)
Chloride: 109 mmol/L (ref 98–111)
Creatinine, Ser: 3.98 mg/dL — ABNORMAL HIGH (ref 0.44–1.00)
GFR calc Af Amer: 12 mL/min — ABNORMAL LOW (ref >60)
GFR calc non Af Amer: 10 mL/min — ABNORMAL LOW (ref >60)
Glucose, Bld: 142 mg/dL — ABNORMAL HIGH (ref 70–99)
Potassium: 4.8 mmol/L (ref 3.5–5.1)
Sodium: 146 mmol/L — ABNORMAL HIGH (ref 135–145)
Total Bilirubin: 1.4 mg/dL — ABNORMAL HIGH (ref 0.3–1.2)
Total Protein: 6.2 g/dL — ABNORMAL LOW (ref 6.5–8.1)

## 2020-03-06 LAB — CBC WITH DIFFERENTIAL/PLATELET
Abs Immature Granulocytes: 0.19 10*3/uL — ABNORMAL HIGH (ref 0.00–0.07)
Basophils Absolute: 0 10*3/uL (ref 0.0–0.1)
Basophils Relative: 0 %
Eosinophils Absolute: 0 10*3/uL (ref 0.0–0.5)
Eosinophils Relative: 0 %
HCT: 27.9 % — ABNORMAL LOW (ref 36.0–46.0)
Hemoglobin: 8.6 g/dL — ABNORMAL LOW (ref 12.0–15.0)
Immature Granulocytes: 2 %
Lymphocytes Relative: 6 %
Lymphs Abs: 0.6 10*3/uL — ABNORMAL LOW (ref 0.7–4.0)
MCH: 30.7 pg (ref 26.0–34.0)
MCHC: 30.8 g/dL (ref 30.0–36.0)
MCV: 99.6 fL (ref 80.0–100.0)
Monocytes Absolute: 1.2 10*3/uL — ABNORMAL HIGH (ref 0.1–1.0)
Monocytes Relative: 11 %
Neutro Abs: 8.5 10*3/uL — ABNORMAL HIGH (ref 1.7–7.7)
Neutrophils Relative %: 81 %
Platelets: 81 10*3/uL — ABNORMAL LOW (ref 150–400)
RBC: 2.8 MIL/uL — ABNORMAL LOW (ref 3.87–5.11)
RDW: 16.3 % — ABNORMAL HIGH (ref 11.5–15.5)
WBC: 10.4 10*3/uL (ref 4.0–10.5)
nRBC: 1.9 % — ABNORMAL HIGH (ref 0.0–0.2)

## 2020-03-06 LAB — URINE CULTURE: Culture: NO GROWTH

## 2020-03-06 LAB — PHOSPHORUS: Phosphorus: 5.8 mg/dL — ABNORMAL HIGH (ref 2.5–4.6)

## 2020-03-06 LAB — MAGNESIUM: Magnesium: 2.3 mg/dL (ref 1.7–2.4)

## 2020-03-06 LAB — UREA NITROGEN, URINE: Urea Nitrogen, Ur: 400 mg/dL

## 2020-03-06 MED ORDER — MORPHINE SULFATE (PF) 2 MG/ML IV SOLN
2.0000 mg | INTRAVENOUS | Status: DC | PRN
  Administered 2020-03-06: 2 mg via INTRAVENOUS
  Filled 2020-03-06: qty 1

## 2020-03-06 MED ORDER — HALOPERIDOL LACTATE 5 MG/ML IJ SOLN: 2.0000 mg | Freq: Four times a day (QID) | INTRAMUSCULAR | Status: DC | PRN

## 2020-03-06 MED ORDER — LORAZEPAM 2 MG/ML IJ SOLN
0.5000 mg | INTRAMUSCULAR | Status: DC | PRN
  Administered 2020-03-06: 0.5 mg via INTRAVENOUS
  Filled 2020-03-06: qty 1

## 2020-03-06 MED ORDER — HYDROMORPHONE BOLUS VIA INFUSION
0.5000 mg | INTRAVENOUS | Status: DC | PRN
  Filled 2020-03-06: qty 1

## 2020-03-06 MED ORDER — SCOPOLAMINE 1 MG/3DAYS TD PT72
1.0000 | MEDICATED_PATCH | TRANSDERMAL | Status: DC
  Administered 2020-03-06: 1.5 mg via TRANSDERMAL
  Filled 2020-03-06 (×2): qty 1

## 2020-03-06 MED ORDER — GLYCOPYRROLATE 0.2 MG/ML IJ SOLN: 0.2000 mg | INTRAMUSCULAR | Status: DC | PRN

## 2020-03-06 MED ORDER — HYDROMORPHONE HCL 1 MG/ML IJ SOLN
1.0000 mg | INTRAMUSCULAR | Status: AC
  Administered 2020-03-06: 1 mg via INTRAVENOUS
  Filled 2020-03-06: qty 1

## 2020-03-06 MED ORDER — SODIUM CHLORIDE 0.9 % IV SOLN
0.5000 mg/h | INTRAVENOUS | Status: DC
  Administered 2020-03-06: 0.5 mg/h via INTRAVENOUS
  Filled 2020-03-06 (×2): qty 2.5

## 2020-03-06 NOTE — Progress Notes (Signed)
RT placed pt on BIPAP for increased WOB while on NRB 100%. Pt settings are as follows: 14/8 BUR 16 60%. RT will continue to monitor.

## 2020-03-06 NOTE — Progress Notes (Signed)
Pharmacy Antibiotic Note  [name] [name] is a 84 y.o. female admitted on 03/06/2020 with weakness.  Pharmacy has been consulted for unasyn dosing for possible aspiration pneumonia.  On day #4 of antibiotics. WBC down to 10.4, afebrile. Scr remains elevated at 3.98 (CrCl 10 mL/min). Remains on BiPAP currently. No growth on BCx or UCx.  Plan: Unasyn 3g q12 hours - if Scr worsens tomorrow will need to adjust to q24 hour frequency  Follow up length of therapy and ability to de-escalate abx  Height: 5\' 1"  (154.9 cm) Weight: 176 lb 9.4 oz (80.1 kg) IBW/kg (Calculated) : 47.8  Temp (24hrs), Avg:98.4 F (36.9 C), Min:98 F (36.7 C), Max:99 F (37.2 C)  Recent Labs  Lab 02/19/2020 1359 03/01/2020 1548 03/03/20 0351 03/04/20 0706 03/05/20 0314 03/06/20 0343  WBC 10.7*  --  7.5 18.8* 14.4* 10.4  CREATININE 1.38*  --  2.02* 3.08* 3.83* 3.98*  LATICACIDVEN 1.4 1.0  --   --   --   --     Estimated Creatinine Clearance: 10.6 mL/min (A) (by C-G formula based on SCr of 3.98 mg/dL (H)).    Allergies  Allergen Reactions  . Tetanus Toxoids Swelling and Other (See Comments)    Arm (site) became swollen as a child  . Snake Antivenin [Antivenin Crotalidae Polyvalent] Other (See Comments)    Just can't take per daughter  . Yellow Dyes (Non-Tartrazine) Itching and Other (See Comments)    Makes patient nervous     Thank you for allowing pharmacy to be a part of this patient's care.  Antonietta Jewel, PharmD, BCCCP Clinical Pharmacist  Phone: [phone]  Please check AMION for all [place] phone numbers After 10:00 PM, call [place] [facility] [phone] 03/06/2020 10:41 AM

## 2020-03-06 NOTE — Consult Note (Signed)
Consultation Note Date: 03/06/2020   Patient Name: [name] [name]  DOB: Sep 18, 1936  MRN: [id number]  Age / Sex: 84 y.o., female  PCP: [name], MD Referring Physician: Kerney Elbe, DO  Reason for Consultation: Establishing goals of care  HPI/Patient Profile: 84 y.o. female  with past medical history of diastolic heart failure, paroxysmal atrial fibrillation, CKD, hypothyroidism, mild dementia admitted on 03/06/2020 with altered mental status, poor oral intake, weakness. Hospital admission for SIRS with hypothermia, acute metabolic encephalopathy with CT/MRI negative for acute findings, afib RVR on amio gtt, AKI, and acute hypoxic respiratory failure possible aspiration requiring BIPAP. Ongoing encephalopathy, worsening kidney function and respiratory status, afib RVR with soft blood pressures. PCCM, cardiology, and nephrology following. Palliative medicine consultation for goals of care/terminal care.  Clinical Assessment and Goals of Care:  I have reviewed medical records, discussed with Dr. Alfredia Ferguson and RN and assessed the patient at bedside. Patient is lethargic on BiPAP. Intermittent episodes of tachypnea and accessory muscle usage. She is not able to participate in conversation.   Met with daughter, [name] [name] at bedside to discuss goals of care. Patient known to this PMT provider from hospital admission in June 2020. Notes reviewed.   I introduced Palliative Medicine as specialized medical care for people living with serious illness. It focuses on providing relief from the symptoms and stress of a serious illness.  Prior to admission, patient living home with daughter. Fairly independent, able to ambulate with walker and would occasionally still cook. Daughter reports only mild cognitive changes and never officially diagnosed with dementia. Patient would sleep downstairs on the couch because she is  too weak to ambulate upstairs to bedrooms. Appetite fair. She has been grieving the loss of her husband in December 2019. [name] [name] is an only child.  Discussed events leading up to admission and course of hospitalization including diagnoses, interventions, plan of care. Reviewed recommendations from specialists. Frankly and compassionately discussed multiorgan failure, failure to thrive, and poor prognosis.   [name] [name] and I reviewed the MOST form that her mother completed in 2020. [name] [name] confirms her mother's wishes for DNR code status and she would not wish for heroic measure at end-of-life. [name] [name] shares that her mother has "fought, and fought, and fought" and deeply misses her husband. [name] [name] shares her belief that her mother is tired and ready.  The difference between aggressive medical intervention and comfort care was considered in light of the patient's goals of care. While at bedside, patient continues to have intermittent episodes of slight agitation with tachypnea and increased accessory muscle use. This is hard for [name] [name] to watch. She is tearful but ready for transition to comfort measures.   Discussed transition to comfort measures only with emphasis on symptom management medications to ensure comfort, peace, dignity at EOL. [name] [name] is agreeable with continuous infusion to ensure comfort. We discussed that once her mother is comfortable on dilaudid gtt, she will be transitioned off BiPAP to nasal cannula. Prepared [name] [name] for 'anything to happen at any time' once BiPAP is removed.  [name] [name] understands prognosis could be hours-days and that her mother will likely pass in the hospital. Discussed EOL expectations, comfort feeds, symptom management medications, and visitor policy.   Answered all questions. Therapeutic listening and spiritual/emotional support provided.    SUMMARY OF RECOMMENDATIONS    GOC discussion with patient's only daughter, [name].    Previously completed MOST form reviewed in EMR with daughter. Daughter confirms her mother's wishes for DNR and against life-prolonging interventions.   Transition to comfort measures only. Daughter understands diagnoses and poor prognosis. Daughter understands interventions not aimed at comfort will be discontinued.   Symptom management--see below. RN to give IV dilaudid and IV ativan x1 now. RN to start dilaudid gtt and transition patient off BiPAP once comfortable on gtt.  Unrestricted visitor access.  Spiritual care consult for EOL care.  Anticipate hospital death within hours-days once BiPAP removed.   Code Status/Advance Care Planning:  DNR/DNI  Symptom Management:   Dilaudid 568m IV and Ativan 0.534mIV x1 now. RN notified.  Initiate dilaudid continuous infusion 0.68m28mr  RN may bolus dilaudid 0.5-1mg3m bolus via infusion for pain/dyspnea/air hunger/tachypnea  Ativan 0.68mg 3mq4h prn anxiety/agitation  Robinul 0.2mg I32m4h prn secretions  Haldol 2mg IV76mh prn agitation  Scopolamine TD  Palliative Prophylaxis:   Aspiration, Delirium Protocol, Frequent Pain Assessment, Oral Care and Turn Reposition  Additional Recommendations (Limitations, Scope, Preferences):  Full Comfort Care  Psycho-social/Spiritual:   Desire for further Chaplaincy support: yes  Additional Recommendations: Caregiving  Support/Resources, Compassionate Wean Education and Education on Hospice  Prognosis:   Anticipate hours-days when BiPAP discontinued.   Discharge Planning: Anticipated Hospital Death      Primary Diagnoses: Present on Admission: . Hypothermia . SIRS (systemic inflammatory response syndrome) (HCC) . Hypothyroidism . Hypertension . IBS (irritable bowel syndrome) . Dementia (HCC) . [place] diastolic congestive heart failure (HCC) . [place] (do not resuscitate)   I have reviewed the medical record, interviewed the patient and family, and examined the patient. The  following aspects are pertinent.  Past Medical History:  Diagnosis Date  . Alopecia   . Anemia    hx of years ago   . Anxiety   . ARF (acute renal failure) (HCC) 9/[place]2015  . Arthritis    psoriatic arthritis  . Back pain   . Blood transfusion    at age of 4  . Carotid stenosis 12/2016  . Chronic diastolic CHF (congestive heart failure) (HCC)   Oleanepression   . Dysrhythmia 11/13/2016   PAFib for a short time- Echo done 11/14/16 PAF- to follow up with PCP- to see if she is a candiate for anticoags.  Not started at times time due to alcohol abuse.  . GERD [name] Kitchengastroesophageal reflux disease)   . H/O hiatal hernia   . History of acute cholangitis   . History of GI diverticular bleed   . Hyperlipidemia   . Hypertension   . Hypothyroidism   . IBS (irritable bowel syndrome)   . Pancreatitis   . Peripheral vascular disease (HCC)   [place]    with Breast reduction- only time  . Rosacea   . Seizures (HCC) [place]2017   Thopught to be from withdrawl Benzodiazepine  . Stroke (HCC)  [facility]-  . Thyroid disease    Social History   Socioeconomic History  . Marital status: Married    Spouse name: Not on file  . Number of children: Not on file  . Years  of education: Not on file  . Highest education level: Not on file  Occupational History  . Not on file  Tobacco Use  . Smoking status: Never Smoker  . Smokeless tobacco: Never Used  Substance and Sexual Activity  . Alcohol use: Yes    Alcohol/week: 7.0 standard drinks    Types: 7 Glasses of wine per week    Comment: rare  . Drug use: No    Types: Benzodiazepines  . Sexual activity: Not Currently  Other Topics Concern  . Not on file  Social History Narrative  . Not on file   Social Determinants of Health   Financial Resource Strain:   . Difficulty of Paying Living Expenses:   Food Insecurity: No Food Insecurity  . Worried About Charity fundraiser in the Last Year: Never true  .  Ran Out of Food in the Last Year: Never true  Transportation Needs: No Transportation Needs  . Lack of Transportation (Medical): No  . Lack of Transportation (Non-Medical): No  Physical Activity:   . Days of Exercise per Week:   . Minutes of Exercise per Session:   Stress:   . Feeling of Stress :   Social Connections:   . Frequency of Communication with Friends and Family:   . Frequency of Social Gatherings with Friends and Family:   . Attends Religious Services:   . Active Member of Clubs or Organizations:   . Attends Archivist Meetings:   [name] Marital Status:    Family History  Problem Relation Age of Onset  . Heart disease Mother   . Cancer Father        lung  . Bipolar disorder Brother    Scheduled Meds: . scopolamine  1 patch Transdermal Q72H  . sodium chloride flush  3 mL Intravenous Q12H   Continuous Infusions: . sodium chloride 10 mL/hr at 03/06/20 0100  . HYDROmorphone     PRN Meds:.sodium chloride, acetaminophen **OR** acetaminophen, glycopyrrolate, haloperidol lactate, HYDROmorphone, LORazepam, ondansetron **OR** ondansetron (ZOFRAN) IV, promethazine, white petrolatum Medications Prior to Admission:  Prior to Admission medications   Medication Sig Start Date End Date Taking? Authorizing Provider  acetaminophen (TYLENOL) 500 MG tablet Take 1 tablet (500 mg total) by mouth every 6 (six) hours as needed for mild pain or headache (pain). Patient taking differently: Take 1,000 mg by mouth every 6 (six) hours as needed for mild pain or headache (pain).  10/24/19  Yes [place] Donnamarie Poag, DO  aspirin EC 81 MG tablet Take 1 tablet (81 mg total) by mouth every morning. 10/24/19  Yes [place] Donnamarie Poag, DO  Calcium Carbonate Antacid (TUMS PO) Take 1 tablet by mouth daily as needed (indigestion/heartburn).   Yes [provider]  chlordiazePOXIDE (LIBRIUM) 5 MG capsule Take 1 capsule (5 mg total) by mouth every morning. 10/25/19  Yes [place] Eric J, DO  FLUoxetine  (PROZAC) 20 MG capsule Take 1 capsule (20 mg total) by mouth daily. 10/24/19  Yes [place] Donnamarie Poag, DO  folic acid (FOLVITE) 1 MG tablet Take 1 tablet (1 mg total) by mouth daily. 10/24/19  Yes [place] Eric J, DO  furosemide (LASIX) 20 MG tablet Take 1 tablet (20 mg total) by mouth daily. 10/24/19  Yes [place] Eric J, DO  hydrALAZINE (APRESOLINE) 25 MG tablet Take 100 mg by mouth 2 (two) times daily.  02/14/20  Yes [provider]  hyoscyamine (LEVBID) 0.375 MG 12 hr tablet Take 1 tablet (0.375 mg total) by mouth 2 (two)  times daily. 10/24/19  Yes [place] Donnamarie Poag, DO  levothyroxine (SYNTHROID) 100 MCG tablet Take 1 tablet (100 mcg total) by mouth daily at 6 (six) AM. Patient taking differently: Take 100 mcg by mouth daily.  10/24/19  Yes [place] Eric J, DO  Melatonin 3 MG TABS Take 3 mg by mouth at bedtime.   Yes [provider]  metoprolol succinate (TOPROL-XL) 25 MG 24 hr tablet Take 3 tablets (75 mg total) by mouth daily. 10/25/19  Yes [place] Donnamarie Poag, DO  Multiple Vitamins-Minerals (ADULT ONE DAILY GUMMIES) CHEW Chew 2 tablets by mouth daily.   Yes [provider]  OVER THE COUNTER MEDICATION Take 6 tablets by mouth daily. Juice Plus - 3 fruits and 3 vegies   Yes [provider]  potassium chloride SA (KLOR-CON) 20 MEQ tablet Take 20 mEq by mouth daily as needed (leg cramps).  02/29/20  Yes [provider]  QUEtiapine (SEROQUEL) 100 MG tablet Take 2 tablets (200 mg total) by mouth at bedtime. Patient taking differently: Take 100-200 mg by mouth at bedtime.  10/24/19 04/11/20 Yes [place] Eric J, DO  simvastatin (ZOCOR) 20 MG tablet Take 1 tablet (20 mg total) by mouth at bedtime. 10/24/19  Yes [place] Eric J, DO  thiamine 100 MG tablet Take 100 mg by mouth daily.   Yes [provider]  venlafaxine XR (EFFEXOR-XR) 37.5 MG 24 hr capsule Take 1 capsule (37.5 mg total) by mouth daily with breakfast. 10/24/19  Yes [place] Eric J, DO  zolpidem  (AMBIEN) 10 MG tablet Take 10 mg by mouth at bedtime.  01/22/20  Yes [provider]  hydrALAZINE (APRESOLINE) 100 MG tablet Take 1 tablet (100 mg total) by mouth 2 (two) times daily. Patient not taking: Reported on 03/08/2020 10/24/19   [place] Donnamarie Poag, DO  vitamin B-12 1000 MCG tablet Take 1 tablet (1,000 mcg total) by mouth daily. Patient not taking: Reported on 03/01/2020 10/25/19   [place] Donnamarie Poag, DO  zolpidem (AMBIEN) 5 MG tablet Take 1 tablet (5 mg total) by mouth at bedtime as needed for sleep. Patient not taking: Reported on 02/21/2020 10/24/19   [place] Eric J, DO   Allergies  Allergen Reactions  . Tetanus Toxoids Swelling and Other (See Comments)    Arm (site) became swollen as a child  . Snake Antivenin [Antivenin Crotalidae Polyvalent] Other (See Comments)    Just can't take per daughter  . Yellow Dyes (Non-Tartrazine) Itching and Other (See Comments)    Makes patient nervous    Review of Systems  Unable to perform ROS: Acuity of condition   Physical Exam Vitals and nursing note reviewed.  Constitutional:      Appearance: She is ill-appearing.  HENT:     Head: Normocephalic and atraumatic.  Cardiovascular:     Rate and Rhythm: Rhythm irregularly irregular.     Comments: afib RVR Pulmonary:     Effort: Tachypnea and accessory muscle usage present.     Breath sounds: Decreased breath sounds and rhonchi present.     Comments: RN to give Dilaudid 58m IV x1 and Ativan 0.564mx1 now Skin:    General: Skin is warm and dry.     Coloration: Skin is pale.  Neurological:     Mental Status: She is lethargic.     Comments: obtunded    Vital Signs: BP 119/66   Pulse (!) 114   Temp 98.2 F (36.8 C) (Axillary)   Resp (!) 25   Ht 5' 1"  (  1.549 m)   Wt 80.1 kg   SpO2 95%   BMI 33.37 kg/m  Pain Scale: Faces   Pain Score: Asleep   SpO2: SpO2: 95 % O2 Device:SpO2: 95 % O2 Flow Rate: .O2 Flow Rate (L/min): 15 L/min  IO: Intake/output summary:   Intake/Output  Summary (Last 24 hours) at 03/06/2020 1405 Last data filed at 03/06/2020 1156 Gross per 24 hour  Intake 2422.03 ml  Output 375 ml  Net 2047.03 ml    LBM: Last BM Date: 02/25/2020 Baseline Weight: Weight: 78.8 kg Most recent weight: Weight: 80.1 kg     Palliative Assessment/Data: PPS 10%   Flowsheet Rows     Most Recent Value  Intake Tab  Referral Department  Hospitalist  Unit at Time of Referral  Intermediate Care Unit  Palliative Care Primary Diagnosis  Other (Comment)  Palliative Care Type  Return patient Palliative Care  Reason for referral  Clarify Goals of Care, End of Life Care Assistance  Date first seen by Palliative Care  03/06/20  Clinical Assessment  Palliative Performance Scale Score  10%  Psychosocial & Spiritual Assessment  Palliative Care Outcomes  Patient/Family meeting held?  Yes  Who was at the meeting?  daughter  Palliative Care Outcomes  Clarified goals of care, Improved pain interventions, Improved non-pain symptom therapy, Counseled regarding hospice, Provided end of life care assistance, Provided psychosocial or spiritual support, Changed to focus on comfort, ACP counseling assistance      Time In: 5038 Time Out: 1410 Time Total: 38mn Greater than 50%  of this time was spent counseling and coordinating care related to the above assessment and plan.  Signed by:  MIhor Dow DNP, FNP-C Palliative Medicine Team  Phone: [phone]: [phone]  Please contact Palliative Medicine Team phone at [phone] questions and concerns.  For individual provider: See AShea Evans

## 2020-03-06 NOTE — Progress Notes (Signed)
Sharon in lab notified of ABG being sent down. RT will continue to monitor.

## 2020-03-06 NOTE — Progress Notes (Signed)
SLP Cancellation Note  Patient Details Name: [name] [name] MRN: [id number] DOB: 11-Jan-1936   Cancelled treatment:       Reason Eval/Treat Not Completed: Patient not medically ready(Pt's case was discussed with RN and she indicated that the pt's level of responsiveness has been reduced and that she has been demonstrating increased WOB and dyspnea. SLP will follow up on subsequent date unless contacted by team sooner.)  Halsey Persaud I. Hardin Negus, [place], [place] Office number [phone] Pager [place] 03/06/2020, 10:14 AM

## 2020-03-06 NOTE — Consult Note (Signed)
NAME:  [name] [name], MRN:  [id number], DOB:  12-27-1935, LOS: 3 ADMISSION DATE:  02/17/2020, CONSULTATION DATE:  03/06/2020 REFERRING MD:  Claybon Jabs DO, CHIEF COMPLAINT: Acute hypoxic respiratory failure  Brief History   84 year old with diastolic heart failure, paroxysmal atrial fibrillation, CKD, hypothyroidism, dementia admitted with encephalopathy, respiratory failure with bilateral pulmonary infiltrates on CT scan, rapid atrial fibrillation. Evaluated by cardiology, nephrology and speech pathology.  Remains n.p.o. due to aspiration risk.    She has been intermittently on BiPAP.  Has worsening respiratory status overnight with increasing encephalopathy.  PCCM consulted for help with management.  Past Medical History    has a past medical history of Alopecia, Anemia, Anxiety, ARF (acute renal failure) ([place]) (08/23/2014), Arthritis, Back pain, Blood transfusion, Carotid stenosis (12/2016), Chronic diastolic CHF (congestive heart failure) ([place]), Depression, Dysrhythmia (11/13/2016), GERD (gastroesophageal reflux disease), H/O hiatal hernia, History of acute cholangitis, History of GI diverticular bleed, Hyperlipidemia, Hypertension, Hypothyroidism, IBS (irritable bowel syndrome), Pancreatitis, Peripheral vascular disease ([place]), PONV (postoperative nausea and vomiting), Rosacea, Seizures ([place]) (11/13/2016), Stroke ([place]), and Thyroid disease.  Significant Hospital Events   3/21- Admit  Consults:    Procedures:    Significant Diagnostic Tests:  CT chest 03/05/2020-multifocal groundglass and consolidation bilaterally right greater than left.  Small right pleural effusion with minimal pleural fluid.  Micro Data:  Blood culture 3/21- Urine culture 3/24-  Antimicrobials:  Unasyn 3/23  Interim history/subjective:    Objective   Blood pressure (!) 128/107, pulse 71, temperature 98.2 F (36.8 C), temperature source Axillary, resp. rate (!) 23, height 5\' 1"  (1.549 m), weight 80.1 kg, SpO2  95 %.    FiO2 (%):  [60 %-100 %] 60 %   Intake/Output Summary (Last 24 hours) at 03/06/2020 1020 Last data filed at 03/06/2020 0408 Gross per 24 hour  Intake 1508.87 ml  Output 375 ml  Net 1133.87 ml   Filed Weights   03/04/20 0408 03/05/20 0410 03/06/20 0437  Weight: 76.8 kg 80 kg 80.1 kg    Examination: Gen:      Mild resp distress HEENT:  EOMI, sclera anicteric Neck:     No masses; no thyromegaly Lungs:    B/L Rhonchi CV:         Regular rate and rhythm; no murmurs Abd:      + bowel sounds; soft, non-tender; no palpable masses, no distension Ext:    No edema; adequate peripheral perfusion Skin:      Warm and dry; no rash Neuro: Obtunded, moans to stimuli  Resolved Hospital Problem list     Assessment & Plan:  Acute respiratory failure secondary to multilobar pneumonia, aspiration BiPAP as needed.  Not an ideal situation but have limited options Keep n.p.o. for now Continue Unasyn.  Follow culture  Atrial fibrillation IV amnio.  Not a candidate for anticoagulation On aspirin Cardiology on board  AKI on CKD Monitor urine output and creatinine.  Nephrology on board  Failure to thrive Goals of care discussion Discussed with daughter Griselle Killoren over telephone.   She has had multiple discussions with mom in past and is clear that she is DNR with no aggressive support I told her that mom's condition is getting worse and cannot continue the BiPAP for much longer due to altered mental status and continuing risk of aspiration. Recommended transitioning to comfort measures.  Daughter is on way to hospital to be at bedside Use morphine as needed and take off BiPAP once family has a chance to visit. Palliative  care consulted  Best practice:  Per primary  Labs   CBC: Recent Labs  Lab 03/03/2020 1359 03/03/20 0351 03/04/20 0706 03/05/20 0314 03/06/20 0343  WBC 10.7* 7.5 18.8* 14.4* 10.4  NEUTROABS 10.1*  --  17.1* 12.9* 8.5*  HGB 12.8 10.1* 10.0* 10.0* 8.6*    HCT 39.2 31.2* 31.5* 32.6* 27.9*  MCV 92.7 93.7 96.9 100.9* 99.6  PLT 66* 63* 68* 72* 81*    Basic Metabolic Panel: Recent Labs  Lab 02/12/2020 1359 03/03/20 0351 03/04/20 0706 03/05/20 0314 03/06/20 0343  NA 137 140 146* 146* 146*  K 4.7 4.8 5.1 5.5* 4.8  CL 100 107 110 111 109  CO2 18* 16* 20* 18* 22  GLUCOSE 115* 69* 133* 125* 142*  BUN 57* 55* 72* 91* 105*  CREATININE 1.38* 2.02* 3.08* 3.83* 3.98*  CALCIUM 8.6* 7.9* 8.5* 9.0 9.0  MG  --   --  2.0 2.4 2.3  PHOS  --   --   --   --  5.8*   GFR: Estimated Creatinine Clearance: 10.6 mL/min (A) (by C-G formula based on SCr of 3.98 mg/dL (H)). Recent Labs  Lab 02/15/2020 1359 02/11/2020 1359 02/19/2020 1548 03/03/20 0351 03/04/20 0706 03/05/20 0314 03/06/20 0343  PROCALCITON <0.10  --   --   --   --   --   --   WBC 10.7*   < >  --  7.5 18.8* 14.4* 10.4  LATICACIDVEN 1.4  --  1.0  --   --   --   --    < > = values in this interval not displayed.    Liver Function Tests: Recent Labs  Lab 02/26/2020 1359 03/04/20 0706 03/05/20 0314 03/06/20 0343  AST 50* 27 86* 96*  ALT 39 24 44 55*  ALKPHOS 125 83 146* 227*  BILITOT 1.3* 1.2 1.3* 1.4*  PROT 6.9 5.8* 6.2* 6.2*  ALBUMIN 3.2* 2.5* 2.5* 2.5*   No results for input(s): LIPASE, AMYLASE in the last 168 hours. No results for input(s): AMMONIA in the last 168 hours.  ABG    Component Value Date/Time   PHART 7.317 (L) 03/06/2020 0900   PCO2ART 43.9 03/06/2020 0900   PO2ART 74.4 (L) 03/06/2020 0900   HCO3 21.8 03/06/2020 0900   TCO2 21 06/26/2017 1204   ACIDBASEDEF 3.4 (H) 03/06/2020 0900   O2SAT 94.1 03/06/2020 0900     Coagulation Profile: Recent Labs  Lab 02/24/2020 1359  INR 1.0    Cardiac Enzymes: Recent Labs  Lab 02/20/2020 1359  CKTOTAL 50    HbA1C: Hgb A1c MFr Bld  Date/Time Value Ref Range Status  06/26/2017 05:01 PM 5.4 4.8 - 5.6 % Final    Comment:    (NOTE)         Pre-diabetes: 5.7 - 6.4         Diabetes: >6.4         Glycemic control for  adults with diabetes: <7.0   11/11/2016 02:01 AM 5.4 4.8 - 5.6 % Final    Comment:    (NOTE)         Pre-diabetes: 5.7 - 6.4         Diabetes: >6.4         Glycemic control for adults with diabetes: <7.0     CBG: No results for input(s): GLUCAP in the last 168 hours.  Review of Systems:   Unable to obtain due to encephalopathy  Past Medical History  She,  has a past medical  history of Alopecia, Anemia, Anxiety, ARF (acute renal failure) ([place]) (08/23/2014), Arthritis, Back pain, Blood transfusion, Carotid stenosis (12/2016), Chronic diastolic CHF (congestive heart failure) ([place]), Depression, Dysrhythmia (11/13/2016), GERD (gastroesophageal reflux disease), H/O hiatal hernia, History of acute cholangitis, History of GI diverticular bleed, Hyperlipidemia, Hypertension, Hypothyroidism, IBS (irritable bowel syndrome), Pancreatitis, Peripheral vascular disease ([place]), PONV (postoperative nausea and vomiting), Rosacea, Seizures ([place]) (11/13/2016), Stroke ([place]), and Thyroid disease.   Surgical History    Past Surgical History:  Procedure Laterality Date  . ABDOMINAL HYSTERECTOMY     partial  . BACK SURGERY  2010   thoractic-screws and rods  . BILE DUCT STENT PLACEMENT  08/26/2014  . BREAST REDUCTION SURGERY    . CHOLECYSTECTOMY  1988   lap  . COLONOSCOPY    . COLONOSCOPY Left 09/08/2014   Procedure: COLONOSCOPY;  Surgeon: Arta Silence, MD;  Location: [facility] ENDOSCOPY;  Service: Endoscopy;  Laterality: Left;  . ENDARTERECTOMY Left 12/28/2016  . ENDARTERECTOMY Left 12/28/2016   Procedure: ENDARTERECTOMY CAROTID LEFT WITH XENOSURE BIOLOGIC PATCH ANGIOPLASTY;  Surgeon: Angelia Mould, MD;  Location: [place];  Service: Vascular;  Laterality: Left;  . ERCP    . ERCP N/A 08/26/2014   Procedure: ENDOSCOPIC RETROGRADE CHOLANGIOPANCREATOGRAPHY (ERCP);  Surgeon: Jeryl Columbia, MD;  Location: [facility] ENDOSCOPY;  Service: Endoscopy;  Laterality: N/A;  . ERCP N/A 09/11/2014   Procedure: ENDOSCOPIC  RETROGRADE CHOLANGIOPANCREATOGRAPHY (ERCP);  Surgeon: Arta Silence, MD;  Location: [facility];  Service: Endoscopy;  Laterality: N/A;  . EUS N/A 09/11/2014   Procedure: ESOPHAGEAL ENDOSCOPIC ULTRASOUND (EUS) RADIAL;  Surgeon: Arta Silence, MD;  Location: [facility] ENDOSCOPY;  Service: Endoscopy;  Laterality: N/A;  . EYE SURGERY Bilateral    Cataract  . JOINT REPLACEMENT  2005   left shoulder replacement   . ORIF CLAVICULAR FRACTURE Left 07/18/2014   Procedure: OPEN REDUCTION INTERNAL FIXATION (ORIF) LEFT CLAVICULAR FRACTURE;  Surgeon: Ninetta Lights, MD;  Location: [place];  Service: Orthopedics;  Laterality: Left;  . SPYGLASS CHOLANGIOSCOPY N/A 09/11/2014   Procedure: SPYGLASS CHOLANGIOSCOPY;  Surgeon: Arta Silence, MD;  Location: [facility] ENDOSCOPY;  Service: Endoscopy;  Laterality: N/A;  . TONSILLECTOMY  as child  . TOTAL KNEE ARTHROPLASTY Left 03/10/2016   Procedure: LEFT TOTAL KNEE ARTHROPLASTY;  Surgeon: Ninetta Lights, MD;  Location: [place];  Service: Orthopedics;  Laterality: Left;  [name] VENTRAL HERNIA REPAIR  06/08/2012   Procedure: LAPAROSCOPIC VENTRAL HERNIA;  Surgeon: Adin Hector, MD;  Location: [facility] ORS;  Service: General;  Laterality: Right;     Social History   reports that she has never smoked. She has never used smokeless tobacco. She reports current alcohol use of about 7.0 standard drinks of alcohol per week. She reports that she does not use drugs.   Family History   Her family history includes Bipolar disorder in her brother; Cancer in her father; Heart disease in her mother.   Allergies Allergies  Allergen Reactions  . Tetanus Toxoids Swelling and Other (See Comments)    Arm (site) became swollen as a child  . Snake Antivenin [Antivenin Crotalidae Polyvalent] Other (See Comments)    Just can't take per daughter  . Yellow Dyes (Non-Tartrazine) Itching and Other (See Comments)    Makes patient nervous      Home Medications  Prior to Admission medications    Medication Sig Start Date End Date Taking? Authorizing Provider  acetaminophen (TYLENOL) 500 MG tablet Take 1 tablet (500 mg total) by mouth every 6 (six) hours as  needed for mild pain or headache (pain). Patient taking differently: Take 1,000 mg by mouth every 6 (six) hours as needed for mild pain or headache (pain).  10/24/19  Yes [place] Donnamarie Poag, DO  aspirin EC 81 MG tablet Take 1 tablet (81 mg total) by mouth every morning. 10/24/19  Yes [place] Donnamarie Poag, DO  Calcium Carbonate Antacid (TUMS PO) Take 1 tablet by mouth daily as needed (indigestion/heartburn).   Yes [provider]  chlordiazePOXIDE (LIBRIUM) 5 MG capsule Take 1 capsule (5 mg total) by mouth every morning. 10/25/19  Yes [place] Eric J, DO  FLUoxetine (PROZAC) 20 MG capsule Take 1 capsule (20 mg total) by mouth daily. 10/24/19  Yes [place] Donnamarie Poag, DO  folic acid (FOLVITE) 1 MG tablet Take 1 tablet (1 mg total) by mouth daily. 10/24/19  Yes [place] Eric J, DO  furosemide (LASIX) 20 MG tablet Take 1 tablet (20 mg total) by mouth daily. 10/24/19  Yes [place] Eric J, DO  hydrALAZINE (APRESOLINE) 25 MG tablet Take 100 mg by mouth 2 (two) times daily.  02/14/20  Yes [provider]  hyoscyamine (LEVBID) 0.375 MG 12 hr tablet Take 1 tablet (0.375 mg total) by mouth 2 (two) times daily. 10/24/19  Yes [place] Donnamarie Poag, DO  levothyroxine (SYNTHROID) 100 MCG tablet Take 1 tablet (100 mcg total) by mouth daily at 6 (six) AM. Patient taking differently: Take 100 mcg by mouth daily.  10/24/19  Yes [place] Eric J, DO  Melatonin 3 MG TABS Take 3 mg by mouth at bedtime.   Yes [provider]  metoprolol succinate (TOPROL-XL) 25 MG 24 hr tablet Take 3 tablets (75 mg total) by mouth daily. 10/25/19  Yes [place] Donnamarie Poag, DO  Multiple Vitamins-Minerals (ADULT ONE DAILY GUMMIES) CHEW Chew 2 tablets by mouth daily.   Yes [provider]  OVER THE COUNTER MEDICATION Take 6 tablets by mouth daily. Juice Plus - 3 fruits  and 3 vegies   Yes [provider]  potassium chloride SA (KLOR-CON) 20 MEQ tablet Take 20 mEq by mouth daily as needed (leg cramps).  02/29/20  Yes [provider]  QUEtiapine (SEROQUEL) 100 MG tablet Take 2 tablets (200 mg total) by mouth at bedtime. Patient taking differently: Take 100-200 mg by mouth at bedtime.  10/24/19 04/11/20 Yes [place] Eric J, DO  simvastatin (ZOCOR) 20 MG tablet Take 1 tablet (20 mg total) by mouth at bedtime. 10/24/19  Yes [place] Eric J, DO  thiamine 100 MG tablet Take 100 mg by mouth daily.   Yes [provider]  venlafaxine XR (EFFEXOR-XR) 37.5 MG 24 hr capsule Take 1 capsule (37.5 mg total) by mouth daily with breakfast. 10/24/19  Yes [place] Eric J, DO  zolpidem (AMBIEN) 10 MG tablet Take 10 mg by mouth at bedtime.  01/22/20  Yes [provider]  hydrALAZINE (APRESOLINE) 100 MG tablet Take 1 tablet (100 mg total) by mouth 2 (two) times daily. Patient not taking: Reported on 02/28/2020 10/24/19   [place] Donnamarie Poag, DO  vitamin B-12 1000 MCG tablet Take 1 tablet (1,000 mcg total) by mouth daily. Patient not taking: Reported on 03/06/2020 10/25/19   [place] Donnamarie Poag, DO  zolpidem (AMBIEN) 5 MG tablet Take 1 tablet (5 mg total) by mouth at bedtime as needed for sleep. Patient not taking: Reported on 02/17/2020 10/24/19   [place] Eric J, DO     Critical care time:    The patient is critically ill with multiple organ system  failure and requires high complexity decision making for assessment and support, frequent evaluation and titration of therapies, advanced monitoring, review of radiographic studies and interpretation of complex data.   Critical Care Time devoted to patient care services, exclusive of separately billable procedures, described in this note is 35 minutes.   Marshell Garfinkel MD [facility] Please see [email or web address] for pager details.  03/06/2020, 11:27 AM

## 2020-03-06 NOTE — Progress Notes (Signed)
Dilaudid 0.5mg /hr infusing as per order, scopolamine patch placed behind L ear. Bi-PAP discontinued per palliative orders, patient placed on 3L of oxygen via [place]. Patient oxygen saturation 91% on 3L [place], RR 11 per min. Will continue to monitor

## 2020-03-06 NOTE — Progress Notes (Signed)
[facility] Progress Note   Subjective:   Lethargic on bipap this AM  Objective Vitals:   03/06/20 0500 03/06/20 0752 03/06/20 0800 03/06/20 0900  BP: 116/78 133/62 121/70 (!) 128/107  Pulse: (!) 59 (!) 107 97 71  Resp: (!) 30 (!) 25 (!) 24 (!) 23  Temp:  98.2 F (36.8 C)    TempSrc:  Axillary    SpO2: 94% 96% 97% 95%  Weight:      Height:       Physical Exam General: frail, chronically ill, lethargic on bipap Heart: irreg, tachy, no rub Lungs: coarse with scattered rhonchi, on bipap Abdomen: soft nontender Extremities: trace edema Neuro: arousable but not conversant  Additional Objective Labs: Basic Metabolic Panel: Recent Labs  Lab 03/04/20 0706 03/05/20 0314 03/06/20 0343  NA 146* 146* 146*  K 5.1 5.5* 4.8  CL 110 111 109  CO2 20* 18* 22  GLUCOSE 133* 125* 142*  BUN 72* 91* 105*  CREATININE 3.08* 3.83* 3.98*  CALCIUM 8.5* 9.0 9.0  PHOS  --   --  5.8*   Liver Function Tests: Recent Labs  Lab 03/04/20 0706 03/05/20 0314 03/06/20 0343  AST 27 86* 96*  ALT 24 44 55*  ALKPHOS 83 146* 227*  BILITOT 1.2 1.3* 1.4*  PROT 5.8* 6.2* 6.2*  ALBUMIN 2.5* 2.5* 2.5*   No results for input(s): LIPASE, AMYLASE in the last 168 hours. CBC: Recent Labs  Lab 03/05/2020 1359 02/21/2020 1359 03/03/20 0351 03/03/20 0351 03/04/20 0706 03/05/20 0314 03/06/20 0343  WBC 10.7*   < > 7.5   < > 18.8* 14.4* 10.4  NEUTROABS 10.1*   < >  --   --  17.1* 12.9* 8.5*  HGB 12.8   < > 10.1*   < > 10.0* 10.0* 8.6*  HCT 39.2   < > 31.2*   < > 31.5* 32.6* 27.9*  MCV 92.7  --  93.7  --  96.9 100.9* 99.6  PLT 66*   < > 63*   < > 68* 72* 81*   < > = values in this interval not displayed.   Blood Culture    Component Value Date/Time   SDES URINE, CATHETERIZED 03/05/2020 0645   SPECREQUEST NONE 03/05/2020 0645   CULT  03/05/2020 0645    NO GROWTH Performed at [facility], [place] [address], [place], [place] [address]    REPTSTATUS 03/06/2020 FINAL 03/05/2020  0645    Cardiac Enzymes: Recent Labs  Lab 02/12/2020 1359  CKTOTAL 50   CBG: No results for input(s): GLUCAP in the last 168 hours. Iron Studies: No results for input(s): IRON, TIBC, TRANSFERRIN, FERRITIN in the last 72 hours. @lablastinr3 @ Studies/Results: CT CHEST WO CONTRAST  Result Date: 03/05/2020 CLINICAL DATA:  Pneumonia, effusion or abscess suspected, xray done Dyspnea, chronic, unclear etiology EXAM: CT CHEST WITHOUT CONTRAST TECHNIQUE: Multidetector CT imaging of the chest was performed following the standard protocol without IV contrast. COMPARISON:  Most recent radiograph yesterday. No prior CT available. FINDINGS: Cardiovascular: Atherosclerosis of the thoracic aorta. Ectatic ascending aorta at 3.9 cm without frank aneurysm. Conventional branching pattern from the aortic arch. Multi chamber cardiomegaly. There are coronary artery calcifications. No pericardial effusion. Mediastinum/Nodes: Enlarged right lower paratracheal node at 12 mm. Additional shoddy mediastinal lymph nodes which are subcentimeter. Limited assessment for hilar adenopathy given lack of IV contrast. No esophageal wall thickening. No suspicious thyroid nodule. Lungs/Pleura: Multifocal ground-glass nodularity and consolidation throughout both lungs, right greater than left. Air bronchograms  are more prominent on the right. Dense consolidation in the dependent right lower lobe and central right middle and lower lobes no left pleural effusion. Suspect small right pleural effusion, but only minimal pleural fluid compared to degree of consolidation. There is lower lobe bronchial thickening with areas of mucoid impaction and bronchial filling in the right lower lobe segmental bronchi. Upper Abdomen: Streak artifact from spinal fusion hardware partially obscures evaluation. There is also patient motion artifact. Small exophytic cyst from the upper right kidney. Postcholecystectomy. No definite acute finding. Musculoskeletal:  Posterior fusion with interbody spacers involving the lower thoracic and upper lumbar spine. Plate and screw fixation of left clavicle. Left proximal humeral arthroplasty. No acute osseous abnormalities are seen. Multilevel degenerative change in the thoracic spine. IMPRESSION: 1. Multifocal ground-glass nodularity and consolidation throughout both lungs, right greater than left. Air bronchograms are more prominent on the right. Findings are consistent with multifocal pneumonia, including atypical viral infection. An element of pulmonary edema is also considered. 2. Dense consolidation in the dependent right lower lobe and central right middle and left lower lobes. There is lower lobe bronchial thickening with areas of mucoid impaction/bronchial filling in the right lower lobe. This may represent more confluent pneumonia or aspiration. 3. Possible small right pleural effusion, but only minimal pleural fluid. 4. Cardiomegaly.  Coronary artery calcifications. 5. Enlarged right lower paratracheal node is likely reactive. Aortic Atherosclerosis (ICD10-I70.0). Electronically Signed   By: Keith Rake M.D.   On: 03/05/2020 12:35   US RENAL  Result Date: 03/04/2020 CLINICAL DATA:  Oliguria EXAM: RENAL / URINARY TRACT ULTRASOUND COMPLETE COMPARISON:  None. FINDINGS: Right Kidney: Renal measurements: 9.0 x 5.6 x 5.7 cm. = volume: 151 mL. Increased echogenicity is noted. No mass lesion or hydronephrosis is seen. Left Kidney: Renal measurements: 10.1 x 5.9 x 4.8 cm = volume: 149 mL. 1.1 cm cyst is noted in the upper pole. Cortical thinning and increased echogenicity is noted. No mass lesion or hydronephrosis is seen. Bladder: Decompressed by Foley catheter. Other: None. IMPRESSION: Increased echogenicity. Small left renal cyst. No obstructive changes are noted. Electronically Signed   By: Inez Catalina M.D.   On: 03/04/2020 16:41   DG CHEST PORT 1 VIEW  Result Date: 03/06/2020 CLINICAL DATA:  Shortness of breath.   Respiratory distress. EXAM: PORTABLE CHEST 1 VIEW COMPARISON:  CT 03/05/2020.  Chest x-ray 03/04/2020. FINDINGS: Stable cardiomegaly. Diffuse bilateral pulmonary infiltrates/edema again noted without significant change. No pleural effusion or pneumothorax. Prior thoracolumbar spine fusion. Surgical clips right upper quadrant. Prior ORIF left clavicle. Prior left shoulder replacement. IMPRESSION: 1.  Stable cardiomegaly. 2. Diffuse bilateral pulmonary infiltrates/edema again noted. No significant change. Electronically Signed   By: Marcello Moores  Register   On: 03/06/2020 07:41   ECHOCARDIOGRAM COMPLETE  Result Date: 03/05/2020    ECHOCARDIOGRAM REPORT   Patient Name:   [name] [name] Date of Exam: 03/05/2020 Medical Rec #:  [id number]     Height:       61.0 in Accession #:    [id number]    Weight:       176.4 lb Date of Birth:  [birth year]     BSA:          1.790 m Patient Age:    87 years      BP:           132/53 mmHg Patient Gender: F             HR:  83 bpm. Exam Location:  Inpatient Procedure: 2D Echo, Cardiac Doppler and Color Doppler Indications:    Dyspnea 786.09/R06.00  History:        Patient has prior history of Echocardiogram examinations, most                 recent 09/18/2018. CHF, Arrythmias:Atrial Fibrillation; Risk                 Factors:Hypertension and Dyslipidemia. ETOH abuse.  Sonographer:    Clayton Lefort RDCS (AE) Referring Phys: [id number] [facility] LATIF [facility]  Sonographer Comments: Patient is morbidly obese and suboptimal apical window. Image acquisition challenging due to patient body habitus. Limited ability to position patient. IMPRESSIONS  1. Left ventricular ejection fraction, by estimation, is 60 to 65%. The left ventricle has normal function. The left ventricle has no regional wall motion abnormalities. There is severe left ventricular hypertrophy. Left ventricular diastolic parameters  are indeterminate.  2. Right ventricular systolic function was not well visualized. The right ventricular  size is normal. There is severely elevated pulmonary artery systolic pressure.  3. The mitral valve is normal in structure. Mild mitral valve regurgitation. No evidence of mitral stenosis.  4. Tricuspid valve regurgitation is moderate.  5. The aortic valve is tricuspid. Aortic valve regurgitation is not visualized. Mild to moderate aortic valve sclerosis/calcification is present, without any evidence of aortic stenosis.  6. The inferior vena cava is normal in size with greater than 50% respiratory variability, suggesting right atrial pressure of 3 mmHg. FINDINGS  Left Ventricle: Left ventricular ejection fraction, by estimation, is 60 to 65%. The left ventricle has normal function. The left ventricle has no regional wall motion abnormalities. The left ventricular internal cavity size was normal in size. There is  severe left ventricular hypertrophy. Left ventricular diastolic parameters are indeterminate. Right Ventricle: The right ventricular size is normal. Right vetricular wall thickness was not assessed. Right ventricular systolic function was not well visualized. There is severely elevated pulmonary artery systolic pressure. The tricuspid regurgitant  velocity is 3.79 m/s, and with an assumed right atrial pressure of 15 mmHg, the estimated right ventricular systolic pressure is [id number] mmHg. Left Atrium: Left atrial size was normal in size. Right Atrium: Right atrial size was normal in size. Pericardium: Trivial pericardial effusion is present. The pericardial effusion is posterior to the left ventricle. Mitral Valve: The mitral valve is normal in structure. There is mild thickening of the mitral valve leaflet(s). There is mild calcification of the mitral valve leaflet(s). Normal mobility of the mitral valve leaflets. Mild mitral annular calcification. Mild mitral valve regurgitation. No evidence of mitral valve stenosis. Tricuspid Valve: The tricuspid valve is normal in structure. Tricuspid valve regurgitation is  moderate . No evidence of tricuspid stenosis. Aortic Valve: The aortic valve is tricuspid. Aortic valve regurgitation is not visualized. Mild to moderate aortic valve sclerosis/calcification is present, without any evidence of aortic stenosis. Aortic valve mean gradient measures 3.0 mmHg. Aortic valve peak gradient measures 4.5 mmHg. Aortic valve area, by VTI measures 2.29 cm. Pulmonic Valve: The pulmonic valve was normal in structure. Pulmonic valve regurgitation is not visualized. No evidence of pulmonic stenosis. Aorta: The aortic root is normal in size and structure. Venous: The inferior vena cava is normal in size with greater than 50% respiratory variability, suggesting right atrial pressure of 3 mmHg. IAS/Shunts: No atrial level shunt detected by color flow Doppler.  LEFT VENTRICLE PLAX 2D LVIDd:         2.70 cm LVIDs:  1.80 cm LV PW:         1.70 cm LV IVS:        1.80 cm LVOT diam:     1.80 cm LV SV:         45 LV SV Index:   25 LVOT Area:     2.54 cm  RIGHT VENTRICLE            IVC RV Basal diam:  2.90 cm    IVC diam: 2.00 cm RV S prime:     7.83 cm/s TAPSE (M-mode): 1.8 cm LEFT ATRIUM             Index       RIGHT ATRIUM           Index LA diam:        3.30 cm 1.84 cm/m  RA Area:     12.90 cm LA Vol (A2C):   82.6 ml 46.13 ml/m RA Volume:   29.80 ml  16.64 ml/m LA Vol (A4C):   72.6 ml 40.55 ml/m LA Biplane Vol: 77.4 ml 43.23 ml/m  AORTIC VALVE AV Area (Vmax):    2.21 cm AV Area (Vmean):   2.08 cm AV Area (VTI):     2.29 cm AV Vmax:           105.60 cm/s AV Vmean:          82.740 cm/s AV VTI:            0.199 m AV Peak Grad:      4.5 mmHg AV Mean Grad:      3.0 mmHg LVOT Vmax:         91.70 cm/s LVOT Vmean:        67.720 cm/s LVOT VTI:          0.179 m LVOT/AV VTI ratio: 0.90  AORTA Ao Root diam: 2.80 cm Ao Asc diam:  3.60 cm TRICUSPID VALVE TR Peak grad:   57.5 mmHg TR Vmax:        379.00 cm/s  SHUNTS Systemic VTI:  0.18 m Systemic Diam: 1.80 cm Jenkins Rouge MD Electronically signed by  Jenkins Rouge MD Signature Date/Time: 03/05/2020/5:01:47 PM    Final    Medications: . sodium chloride 10 mL/hr at 03/06/20 0100  . amiodarone 30 mg/hr (03/06/20 0728)  . ampicillin-sulbactam (UNASYN) IV 3 g (03/05/20 2213)  . dextrose 75 mL/hr at 03/06/20 0408   . aspirin EC  81 mg Oral q morning - 10a  . chlordiazePOXIDE  5 mg Oral q morning - 10a  . Chlorhexidine Gluconate Cloth  6 each Topical Daily  . FLUoxetine  20 mg Oral Daily  . folic acid  1 mg Oral Daily  . hyoscyamine  0.375 mg Oral BID  . ipratropium  0.5 mg Nebulization Q6H  . levalbuterol  0.63 mg Nebulization Q6H  . levothyroxine  100 mcg Oral Q0600  . QUEtiapine  200 mg Oral QHS  . simvastatin  20 mg Oral QHS  . sodium chloride flush  3 mL Intravenous Q12H  . venlafaxine XR  37.5 mg Oral Q breakfast  . cyanocobalamin  1,000 mcg Oral Daily    Assessment/Plan **AKI:  Mild CKD at baseline now with significant AKI.  UA with small protein and blood,  doesn't look like acute GN.  Renal US fine.  Low urine sodium but failed a volume challenge 3/24 (albeit judicious given tenuous pulm status and ?pulm edema on CT chest).  I suspect this  is hemodynamically mediated ATN in setting of sepsis.  She would not be a candidate for dialysis with her dementia and poor functional status.  Continue supportive care.   **hypoxic respiratory failure:  Appears this is more driven by aspiration, pneumonia than fluid based on CT chest.  Cont abx.    **HTN:  Has been having some low BPs so hydralazine has been discontinued.    **A fib:  Didn't tolerate IV BB due to hypotension, better on amiodarone, cardiology following.   **Hyperkalemia:  Secondary to AKI.  lokelma 10g 3/25, K 4.8 today. [name]   **Anemia: per primary, no role for ESA  **Thrombocytopenia: since 02/2019, Plt 81, no bleeding.  CTM.   Prognosis seems poor.  Not sure what else I have to offer and agree with palliative involvement.  I will sign off but please call if there is  additional input nephrology can provide.   Jannifer Hick MD 03/06/2020, 10:01 AM  [facility] Pager: [phone]

## 2020-03-06 NOTE — Progress Notes (Signed)
PROGRESS NOTE    [name] [name]  HGD:[id number] DOB: 1936-08-10 DOA: 02/15/2020 PCP: [name], MD  Brief Narrative:  HPI per Dr. Fuller Plan on 03/01/2020 [name] [name] is a 84 y.o. female with medical history significant of diastolic CHF, paroxysmal atrial fibrillation, CKD, hypothyroidism, and dementia.  History is obtained from review of records and daughter over the phone.  At baseline patient ambulates with the use of a walker and normally sleeps downstairs on the couch.  For the last 2 days she had not been able to get up off of the couch normal.  Her daughter had placed a potty chair beside her, but noted that she was unable to get up and ultimately placed an adult brief on her.  She had not been wanting to eat or drink much despite her daughter trying.  This morning when she awoke the patient was not able to speak.  The patient's daughter appears to manage her medications and had not had any recent changes.  A friend reportedly came over and called EMS.  Patient currently able to talk, but does not report any complaints at this time.  She is unaware of why she is here in the hospital.  Daughter noted similar episode with the patient not being able to walk back in November 2020 where she had to go to collapse for rehab.  During that hospitalization as well it appears she met SIRS criteria, but did not require any antibiotics as no clear source of infection was found.  EMS found the patient in her bed that was in a sun room for insulation soiled with urine, food crumbs caked around her mouth, and with dry mucous membranes.  Her initial temperature was checked and she was noted to be hypothermic.   ED Course: Upon admission into the emergency department patient was noted to be 87.9, pulse 53-58, aspirations 18-22, blood pressure 156/64-188/81, and O2 saturations maintained on room air.  Labs significant for WBC 10.7, BUN 57, creatinine 1.38, CK 50, lactic acid 1.4.  Chest x-ray showed  possible signs of bronchitis but was of low lung volumes.  Urinalysis did not show any significant signs of infection.  Patient was given 1 L normal saline IV fluids and placed on a bear hugger.  TRH called to admit.  **Interim History Patient's temperature has improved however she remains encephalopathic and the night before last went into A. fib with RVR.  Renal function is worsening cardiology is consulted for assistance with management of heart rate.  Nephrology was also consulted for her acute kidney injury.  Her labs seem to be worsening and she was started on D5W.  Patient has a high risk for decompensation so we will consult palliative care for further goals of care discussion given that she has uncontrolled heart rate still.  Overnight, the patient further decompensated and ended up on BiPAP.  She was encephalopathic and essentially unresponsive on the BiPAP however she did respond to painful stimuli.  Pulmonary was consulted for further assistance with her respiratory status given her worsening respiratory condition.  Pulmonary had no further recommendations and recommended comfort care given her high risk for further decompensation.  Palliative care was consulted and after goals of care discussion with the patient's daughter decision was made to transition the patient to full comfort she is started on a Dilaudid drip and likely the patient will pass away in the hospital given her worsening condition.    Assessment & Plan:   Principal Problem:  Hypothermia Active Problems:   Hypertension   IBS (irritable bowel syndrome)   Hypothyroidism   Weakness   Chronic diastolic congestive heart failure (HCC)   Dementia (HCC)   SIRS (systemic inflammatory response syndrome) (HCC)   DNR (do not resuscitate)  Hypothermia, SIRS: Acute.  -Pt's initial rectal temperature was noted to be 87.9 F with WBC elevated at 10.7.  -A admission, lactic acid was 1.  -CXR noted possible bronchitis -UA did  not show significant signs of infection.  -Pt was placed on empiric hugger -On 3/22: Bld Cx, UCx pending/NTD -Procal is negative -CRP is 6.4, WBC have decreased to 7.5, temp is improving; sed reate is slightly elevated for her age at 9 -TSH is normal, but check FT4 and -3/23: FT4 is ok; temp is 98.4 oral; etiology still unknown; check cortisol -GEN acutely stated and after goals of care discussion with palliative and the patient daughter decision was made to transition the patient to comfort care and full comfort measures have been enacted and all medications none patient's comfort will be discontinued -She has been more lethargic and essentially unresponsive this morning only responds to painful stimuli  Acute metabolic encephalopathy w/ history of dementia, worsened -Family had reported the patient was not able to walk over the last 2 days and had developed acute aphasia  -Pt has symptoms secondary to severe hyperthermia as pt's bed is in a sun room with poor insulation  -Pt was able to talk after being placed on bear hugger on physical exam at admission, but not able to give any significant details regarding on why she here in the hospital and now she is essentially unresponsive -CT Head was negative  -MRI is negative; she was confused yesterday and more alert but she is not interactive and somnolent and lethargic on BiPAP only responds to painful stimuli -Patient will be transition to comfort care measures  Lower extremity weakness, Acute. -Patient reportedly not able to get up and walk with use of a walker as previously done over the last 2 days -On 3/23: MRI is negative; PT/OT to see when more stable however will stop this given that the patient will be transition to comfort care  Diastolic CHF, Chronic.  -Last EF noted to be 60 to 65% without significant wall motion abnormalities appreciated in 09/2018.  -Pt was euvolemic at admission but now there is some question about  some pulmonary edema -Strict I&Os, daily wts -Restart furosemide when medically appropriate but this is not appropriate at this time given her worsening renal function -Today SCR is increased and UOP is down; she's had 2 IV doses of lasix in the last 24 hrs that are twice the amount of what she normally has; -She was started on IV fluids however will transition to comfort care measures now and she is on a Dilaudid drip -All medications not in the patient's comfort have been discontinued  Essential Hypertension but now hypotensive -On admission blood pressures elevated up to 188/81.  -Home blood pressure medications include hydralazine 100 mg twice daily, furosemide 20 mg daily, and metoprolol 75 mg daily. -Blood pressure is low in the setting of A. fib with RVR  -Stop the IV metoprolol given that the patient is being transitioned to comfort care  Hypothyroidism -Last TSH noted to be 2.658 on 10/18/2019 -TSH is 0.861; FT4 ok -Continue levothyroxine 100 mcg if able and if she is not taking p.o. we will need to change IV however will not do this given the patient  is being transitioned to comfort care  Depression; Chronic. -We will stop the Prozac as she is being transitioned to comfort  Irritable bowel syndrome; Chronic.  -Pt is on hyoscyamine for treatment but will discontinue  Insomina -At home patient had been taking 10 mg of Ambien and 200 mg of Seroquel nightly -We will discontinue Seroquel and reduce Ambien to 5 mg if able to take po   Thrombocytopenia -like as a inflammatory response rxn; monitor -change lovenox to SCDs for now -Patient's platelet count is now 72,000 -Continue to monitor for signs and symptoms of bleeding; currently no overt bleeding noted  Paroxysmal atrial fibrillation with RVR -Went into RVR with bursts that were sustained -Poor anticoagulation candidate despite having a CHA2DS2-VASc score of at least 5 -Continue aspirin for now but patient is  n.p.o. -Because the patient had some uncontrolled heart rates cardiology is consulted and they recommended scheduling metoprolol every 6 -Patient in the evening became hypotensive and had heart rate sustained in the 160s so I spoke with cardiology again they recommended amiodarone drip with a slow low bolus  -Continued amiodarone for now given that we cannot really use digoxin given her renal function and I have discontinued this after more -Repeat echocardiogram as below -Appreciate cardiology evaluation -Will stop Amiodarone given that the patient is being transitioned to Comfort Measures   Acute hypoxic respiratory failure, worsened Aspiration -SLP recommending NPO still  -Currently remains on a nonrebreather as she was previously on the BiPAP but had to be transitioned back to BiPAP and will be transitioned off and on a Dilaudid gtt -episode of N/V this AM; possible aspiration, CXR c/w that, start Unasyn, breathing Tx -C/w aspiration precautions, required BiPap overnight, wean as able, needs nausea control -Continue to treat and continue with pulmonary hygiene as well as Xopenex/Atrovent -Repeat chest x-ray in a.m. -If worsening may need to consult pulmonary -Palliative care consulted for further goals of care discussion  AKI on mild CKD, worsening  Elevated anion gap Metabolic Acidosis Hypernatremia, mild Hyperkalemia -Worsening as patient's BUN and creatinine have worsened all the way to 91/3.83 -checked urine studies, renal US, give fluids, watch nephrotoxins -Nephrology consulted for further evaluation recommendations -Nephrology recommending to check UPC but did not clinically feel that appears like acute glomerulonephritis  -Renal ultrasound was unrevealing -She is also given albumin 12.5 mg and is currently on D5W given her hyponatremia -There trying a small volume challenge given that the patient appeared clinically dry but they were to be judicious given questionable  pulmonary edema  -They feel that this is infective than that this is hemodynamically mediated ATN in the setting of sepsis and the patient no longer a candidate for dialysis with her dementia poor functional status -Patient's hydralazine has been reduced to 50 mg p.o. twice daily to prevent further hypotension -We will stop all her current measures given transition to comfort care  Obesity -Estimated body mass index is 33.37 kg/m as calculated from the following:   Height as of this encounter: _0  (1.549 m).   Weight as of this encounter: 80.1 kg. -Weight Loss and Dietary Counseling given   GOC: DNR, poA  DVT prophylaxis: None now that she is FULL COMFORT MEAUSRES Code Status: DO NOT RESUSCITATE and COMFORT MEASURES Family Communication: No family present and I attempted to Call the Daughter and she did not pick up twice.  Disposition Plan: Transition to [facility] given High Risk of Decompensation   Consultants:  Cardiology  Nephrology  Palliative Care Medicine    Procedures:  ECHOCARDIOGRAM IMPRESSIONS    1. Left ventricular ejection fraction, by estimation, is 60 to 65%. The  left ventricle has normal function. The left ventricle has no regional  wall motion abnormalities. There is severe left ventricular hypertrophy.  Left ventricular diastolic parameters  are indeterminate.  2. Right ventricular systolic function was not well visualized. The right  ventricular size is normal. There is severely elevated pulmonary artery  systolic pressure.  3. The mitral valve is normal in structure. Mild mitral valve  regurgitation. No evidence of mitral stenosis.  4. Tricuspid valve regurgitation is moderate.  5. The aortic valve is tricuspid. Aortic valve regurgitation is not  visualized. Mild to moderate aortic valve sclerosis/calcification is  present, without any evidence of aortic stenosis.  6. The inferior vena cava is normal in  size with greater than 50%  respiratory variability, suggesting right atrial pressure of 3 mmHg.   FINDINGS  Left Ventricle: Left ventricular ejection fraction, by estimation, is 60  to 65%. The left ventricle has normal function. The left ventricle has no  regional wall motion abnormalities. The left ventricular internal cavity  size was normal in size. There is  severe left ventricular hypertrophy. Left ventricular diastolic  parameters are indeterminate.   Right Ventricle: The right ventricular size is normal. Right vetricular  wall thickness was not assessed. Right ventricular systolic function was  not well visualized. There is severely elevated pulmonary artery systolic  pressure. The tricuspid regurgitant  velocity is 3.79 m/s, and with an assumed right atrial pressure of 15  mmHg, the estimated right ventricular systolic pressure is 78.6 mmHg.   Left Atrium: Left atrial size was normal in size.   Right Atrium: Right atrial size was normal in size.   Pericardium: Trivial pericardial effusion is present. The pericardial  effusion is posterior to the left ventricle.   Mitral Valve: The mitral valve is normal in structure. There is mild  thickening of the mitral valve leaflet(s). There is mild calcification of  the mitral valve leaflet(s). Normal mobility of the mitral valve leaflets.  Mild mitral annular calcification.  Mild mitral valve regurgitation. No evidence of mitral valve stenosis.   Tricuspid Valve: The tricuspid valve is normal in structure. Tricuspid  valve regurgitation is moderate . No evidence of tricuspid stenosis.   Aortic Valve: The aortic valve is tricuspid. Aortic valve regurgitation is  not visualized. Mild to moderate aortic valve sclerosis/calcification is  present, without any evidence of aortic stenosis. Aortic valve mean  gradient measures 3.0 mmHg. Aortic  valve peak gradient measures 4.5 mmHg. Aortic valve area, by VTI measures  2.29 cm.    Pulmonic Valve: The pulmonic valve was normal in structure. Pulmonic valve  regurgitation is not visualized. No evidence of pulmonic stenosis.   Aorta: The aortic root is normal in size and structure.   Venous: The inferior vena cava is normal in size with greater than 50%  respiratory variability, suggesting right atrial pressure of 3 mmHg.   IAS/Shunts: No atrial level shunt detected by color flow Doppler.     LEFT VENTRICLE  PLAX 2D  LVIDd:     2.70 cm  LVIDs:     1.80 cm  LV PW:     1.70 cm  LV IVS:    1.80 cm  LVOT diam:   1.80 cm  LV SV:     45  LV SV Index:  25  LVOT Area:  2.54 cm     RIGHT VENTRICLE      IVC  RV Basal diam: 2.90 cm  IVC diam: 2.00 cm  RV S prime:   7.83 cm/s  TAPSE (M-mode): 1.8 cm   LEFT ATRIUM       Index    RIGHT ATRIUM      Index  LA diam:    3.30 cm 1.84 cm/m RA Area:   12.90 cm  LA Vol (A2C):  82.6 ml 46.13 ml/m RA Volume:  29.80 ml 16.64 ml/m  LA Vol (A4C):  72.6 ml 40.55 ml/m  LA Biplane Vol: 77.4 ml 43.23 ml/m  AORTIC VALVE  AV Area (Vmax):  2.21 cm  AV Area (Vmean):  2.08 cm  AV Area (VTI):   2.29 cm  AV Vmax:      105.60 cm/s  AV Vmean:     82.740 cm/s  AV VTI:      0.199 m  AV Peak Grad:   4.5 mmHg  AV Mean Grad:   3.0 mmHg  LVOT Vmax:     91.70 cm/s  LVOT Vmean:    67.720 cm/s  LVOT VTI:     0.179 m  LVOT/AV VTI ratio: 0.90    AORTA  Ao Root diam: 2.80 cm  Ao Asc diam: 3.60 cm   TRICUSPID VALVE  TR Peak grad:  57.5 mmHg  TR Vmax:    379.00 cm/s    SHUNTS  Systemic VTI: 0.18 m  Systemic Diam: 1.80 cm    Antimicrobials:  Anti-infectives (From admission, onward)   Start     Dose/Rate Route Frequency Ordered Stop   03/04/20 0500  Ampicillin-Sulbactam (UNASYN) 3 g in sodium chloride 0.9 % 100 mL IVPB  Status:  Discontinued     3 g 200 mL/hr over 30 Minutes Intravenous Every 12 hours 03/03/20  1431 03/06/20 1334   03/03/20 1500  Ampicillin-Sulbactam (UNASYN) 3 g in sodium chloride 0.9 % 100 mL IVPB     3 g 200 mL/hr over 30 Minutes Intravenous  Once 03/03/20 1431 03/03/20 1717     Subjective: Seen and examined at bedside and as she is significantly encephalopathic and on the BiPAP now.  She is minimally responsive but does respond to pain.  She acutely worsened in the middle of the night.  After palliative care discussion with the patient's daughter patient will be transition to comfort care measures as she has a very high risk of decompensation and in-hospital death is expected.  Objective: Vitals:   03/06/20 0800 03/06/20 0900 03/06/20 1000 03/06/20 1100  BP: 121/70 (!) 128/107 111/73 119/66  Pulse: 97 71 (!) 114 (!) 114  Resp: (!) 24 (!) 23 (!) 25 (!) 25  Temp:      TempSrc:      SpO2: 97% 95% 95% 95%  Weight:      Height:        Intake/Output Summary (Last 24 hours) at 03/06/2020 1341 Last data filed at 03/06/2020 1156 Gross per 24 hour  Intake 2422.03 ml  Output 375 ml  Net 2047.03 ml   Filed Weights   03/04/20 0408 03/05/20 0410 03/06/20 0437  Weight: 76.8 kg 80 kg 80.1 kg   Examination: Physical Exam:  Constitutional: WN/WD occasion female currently in respiratory distress now on BiPAP 16/8 appears extremely lethargic and somnolent Eyes: Lids and conjunctivae normal, sclerae anicteric  ENMT: External Ears, Nose appear normal.  Neck: Appears normal, supple, no cervical masses, normal ROM, no appreciable thyromegaly; no  JVD Respiratory: Diminished to auscultation bilaterally with coarse breath sounds and rhonchi.  She has increased respiratory rate and she is wearing BiPAP now Cardiovascular: Irregularly irregular and tachycardic,; has 1+ lower extremity edema Abdomen: Soft, non-tender, distended secondary body habitus.  Bowel sounds positive.  GU: Deferred. Musculoskeletal: No clubbing / cyanosis of digits/nails. No joint deformity upper and lower  extremities. Skin: No rashes, lesions, ulcers on limited skin evaluation. No induration; Warm and dry.  Neurologic: CN 2-12 grossly intact with no focal deficits. Romberg sign and cerebellar reflexes not assessed.  Psychiatric: Impaired judgment and insight.  She is not alert and oriented x 3.    Data Reviewed: I have personally reviewed following labs and imaging studies  CBC: Recent Labs  Lab 03/04/2020 1359 03/03/20 0351 03/04/20 0706 03/05/20 0314 03/06/20 0343  WBC 10.7* 7.5 18.8* 14.4* 10.4  NEUTROABS 10.1*  --  17.1* 12.9* 8.5*  HGB 12.8 10.1* 10.0* 10.0* 8.6*  HCT 39.2 31.2* 31.5* 32.6* 27.9*  MCV 92.7 93.7 96.9 100.9* 99.6  PLT 66* 63* 68* 72* 81*   Basic Metabolic Panel: Recent Labs  Lab 02/14/2020 1359 03/03/20 0351 03/04/20 0706 03/05/20 0314 03/06/20 0343  NA 137 140 146* 146* 146*  K 4.7 4.8 5.1 5.5* 4.8  CL 100 107 110 111 109  CO2 18* 16* 20* 18* 22  GLUCOSE 115* 69* 133* 125* 142*  BUN 57* 55* 72* 91* 105*  CREATININE 1.38* 2.02* 3.08* 3.83* 3.98*  CALCIUM 8.6* 7.9* 8.5* 9.0 9.0  MG  --   --  2.0 2.4 2.3  PHOS  --   --   --   --  5.8*   GFR: Estimated Creatinine Clearance: 10.6 mL/min (A) (by C-G formula based on SCr of 3.98 mg/dL (H)). Liver Function Tests: Recent Labs  Lab 03/01/2020 1359 03/04/20 0706 03/05/20 0314 03/06/20 0343  AST 50* 27 86* 96*  ALT 39 24 44 55*  ALKPHOS 125 83 146* 227*  BILITOT 1.3* 1.2 1.3* 1.4*  PROT 6.9 5.8* 6.2* 6.2*  ALBUMIN 3.2* 2.5* 2.5* 2.5*   No results for input(s): LIPASE, AMYLASE in the last 168 hours. No results for input(s): AMMONIA in the last 168 hours. Coagulation Profile: Recent Labs  Lab 02/19/2020 1359  INR 1.0   Cardiac Enzymes: Recent Labs  Lab 02/20/2020 1359  CKTOTAL 50   BNP (last 3 results) No results for input(s): PROBNP in the last 8760 hours. HbA1C: No results for input(s): HGBA1C in the last 72 hours. CBG: No results for input(s): GLUCAP in the last 168 hours. Lipid Profile: No  results for input(s): CHOL, HDL, LDLCALC, TRIG, CHOLHDL, LDLDIRECT in the last 72 hours. Thyroid Function Tests: No results for input(s): TSH, T4TOTAL, FREET4, T3FREE, THYROIDAB in the last 72 hours. Anemia Panel: No results for input(s): VITAMINB12, FOLATE, FERRITIN, TIBC, IRON, RETICCTPCT in the last 72 hours. Sepsis Labs: Recent Labs  Lab 03/08/2020 1359 03/04/2020 1548  PROCALCITON <0.10  --   LATICACIDVEN 1.4 1.0    Recent Results (from the past 240 hour(s))  Culture, blood (Routine x 2)     Status: None (Preliminary result)   Collection Time: 03/06/2020  1:59 PM   Specimen: BLOOD  Result Value Ref Range Status   Specimen Description BLOOD BLOOD LEFT HAND  Final   Special Requests   Final    BOTTLES DRAWN AEROBIC AND ANAEROBIC Blood Culture results may not be optimal due to an inadequate volume of blood received in culture bottles   Culture  Final    NO GROWTH 4 DAYS Performed at [facility], [place] [address] [place], [place] [address]    Report Status PENDING  Incomplete  Urine culture     Status: Abnormal   Collection Time: 02/12/2020  1:59 PM   Specimen: Urine, Random  Result Value Ref Range Status   Specimen Description URINE, RANDOM  Final   Special Requests   Final    NONE Performed at [facility], [place] [address] [place], [place] [address]    Culture MULTIPLE SPECIES PRESENT, SUGGEST RECOLLECTION (A)  Final   Report Status 03/03/2020 FINAL  Final  Culture, blood (Routine x 2)     Status: None (Preliminary result)   Collection Time: 02/11/2020  3:48 PM   Specimen: BLOOD RIGHT WRIST  Result Value Ref Range Status   Specimen Description BLOOD RIGHT WRIST  Final   Special Requests   Final    BOTTLES DRAWN AEROBIC AND ANAEROBIC Blood Culture results may not be optimal due to an excessive volume of blood received in culture bottles   Culture   Final    NO GROWTH 4 DAYS Performed at [facility], [place] [address], [place], [place] [address]    Report  Status PENDING  Incomplete  SARS CORONAVIRUS 2 (TAT 6-24 HRS) Nasopharyngeal Nasopharyngeal Swab     Status: None   Collection Time: 03/01/2020  4:44 PM   Specimen: Nasopharyngeal Swab  Result Value Ref Range Status   SARS Coronavirus 2 NEGATIVE NEGATIVE Final    Comment: (NOTE) SARS-CoV-2 target nucleic acids are NOT DETECTED. The SARS-CoV-2 RNA is generally detectable in upper and lower respiratory specimens during the acute phase of infection. Negative results do not preclude SARS-CoV-2 infection, do not rule out co-infections with other pathogens, and should not be used as the sole basis for treatment or other patient management decisions. Negative results must be combined with clinical observations, patient history, and epidemiological information. The expected result is Negative. Fact Sheet for Patients: SugarRoll.be Fact Sheet for Healthcare Providers: https://www.woods-mathews.com/ This test is not yet approved or cleared by the [place] FDA and  has been authorized for detection and/or diagnosis of SARS-CoV-2 by FDA under an Emergency Use Authorization (EUA). This EUA will remain  in effect (meaning this test can be used) for the duration of the COVID-19 declaration under Section 56 4(b)(1) of the Act, 21 U.S.C. section 360bbb-3(b)(1), unless the authorization is terminated or revoked sooner. Performed at [facility], [place] [address] [place], [place] [address]   Culture, Urine     Status: None   Collection Time: 03/05/20  6:45 AM   Specimen: Urine, Catheterized  Result Value Ref Range Status   Specimen Description URINE, CATHETERIZED  Final   Special Requests NONE  Final   Culture   Final    NO GROWTH Performed at [place] [address] [place], [place] [address]    Report Status 03/06/2020 FINAL  Final     RN Pressure Injury Documentation:     Estimated body mass index is 33.37 kg/m as calculated  from the following:   Height as of this encounter: _0  (1.549 m).   Weight as of this encounter: 80.1 kg.  Malnutrition Type:      Malnutrition Characteristics:      Nutrition Interventions:      Radiology Studies: CT CHEST WO CONTRAST  Result Date: 03/05/2020 CLINICAL DATA:  Pneumonia, effusion or abscess suspected, xray  done Dyspnea, chronic, unclear etiology EXAM: CT CHEST WITHOUT CONTRAST TECHNIQUE: Multidetector CT imaging of the chest was performed following the standard protocol without IV contrast. COMPARISON:  Most recent radiograph yesterday. No prior CT available. FINDINGS: Cardiovascular: Atherosclerosis of the thoracic aorta. Ectatic ascending aorta at 3.9 cm without frank aneurysm. Conventional branching pattern from the aortic arch. Multi chamber cardiomegaly. There are coronary artery calcifications. No pericardial effusion. Mediastinum/Nodes: Enlarged right lower paratracheal node at 12 mm. Additional shoddy mediastinal lymph nodes which are subcentimeter. Limited assessment for hilar adenopathy given lack of IV contrast. No esophageal wall thickening. No suspicious thyroid nodule. Lungs/Pleura: Multifocal ground-glass nodularity and consolidation throughout both lungs, right greater than left. Air bronchograms are more prominent on the right. Dense consolidation in the dependent right lower lobe and central right middle and lower lobes no left pleural effusion. Suspect small right pleural effusion, but only minimal pleural fluid compared to degree of consolidation. There is lower lobe bronchial thickening with areas of mucoid impaction and bronchial filling in the right lower lobe segmental bronchi. Upper Abdomen: Streak artifact from spinal fusion hardware partially obscures evaluation. There is also patient motion artifact. Small exophytic cyst from the upper right kidney. Postcholecystectomy. No definite acute finding. Musculoskeletal: Posterior fusion with interbody  spacers involving the lower thoracic and upper lumbar spine. Plate and screw fixation of left clavicle. Left proximal humeral arthroplasty. No acute osseous abnormalities are seen. Multilevel degenerative change in the thoracic spine. IMPRESSION: 1. Multifocal ground-glass nodularity and consolidation throughout both lungs, right greater than left. Air bronchograms are more prominent on the right. Findings are consistent with multifocal pneumonia, including atypical viral infection. An element of pulmonary edema is also considered. 2. Dense consolidation in the dependent right lower lobe and central right middle and left lower lobes. There is lower lobe bronchial thickening with areas of mucoid impaction/bronchial filling in the right lower lobe. This may represent more confluent pneumonia or aspiration. 3. Possible small right pleural effusion, but only minimal pleural fluid. 4. Cardiomegaly.  Coronary artery calcifications. 5. Enlarged right lower paratracheal node is likely reactive. Aortic Atherosclerosis (ICD10-I70.0). Electronically Signed   By: Keith Rake M.D.   On: 03/05/2020 12:35   US RENAL  Result Date: 03/04/2020 CLINICAL DATA:  Oliguria EXAM: RENAL / URINARY TRACT ULTRASOUND COMPLETE COMPARISON:  None. FINDINGS: Right Kidney: Renal measurements: 9.0 x 5.6 x 5.7 cm. = volume: 151 mL. Increased echogenicity is noted. No mass lesion or hydronephrosis is seen. Left Kidney: Renal measurements: 10.1 x 5.9 x 4.8 cm = volume: 149 mL. 1.1 cm cyst is noted in the upper pole. Cortical thinning and increased echogenicity is noted. No mass lesion or hydronephrosis is seen. Bladder: Decompressed by Foley catheter. Other: None. IMPRESSION: Increased echogenicity. Small left renal cyst. No obstructive changes are noted. Electronically Signed   By: Inez Catalina M.D.   On: 03/04/2020 16:41   DG CHEST PORT 1 VIEW  Result Date: 03/06/2020 CLINICAL DATA:  Shortness of breath.  Respiratory distress. EXAM:  PORTABLE CHEST 1 VIEW COMPARISON:  CT 03/05/2020.  Chest x-ray 03/04/2020. FINDINGS: Stable cardiomegaly. Diffuse bilateral pulmonary infiltrates/edema again noted without significant change. No pleural effusion or pneumothorax. Prior thoracolumbar spine fusion. Surgical clips right upper quadrant. Prior ORIF left clavicle. Prior left shoulder replacement. IMPRESSION: 1.  Stable cardiomegaly. 2. Diffuse bilateral pulmonary infiltrates/edema again noted. No significant change. Electronically Signed   By: Marcello Moores  Register   On: 03/06/2020 07:41   ECHOCARDIOGRAM COMPLETE  Result Date: 03/05/2020  ECHOCARDIOGRAM REPORT   Patient Name:   MARTESHA NIEDERMEIER Date of Exam: 03/05/2020 Medical Rec #:  [id number]     Height:       61.0 in Accession #:    [id number]    Weight:       176.4 lb Date of Birth:  10/29/1936     BSA:          1.790 m Patient Age:    32 years      BP:           132/53 mmHg Patient Gender: F             HR:           83 bpm. Exam Location:  Inpatient Procedure: 2D Echo, Cardiac Doppler and Color Doppler Indications:    Dyspnea 786.09/R06.00  History:        Patient has prior history of Echocardiogram examinations, most                 recent 09/18/2018. CHF, Arrythmias:Atrial Fibrillation; Risk                 Factors:Hypertension and Dyslipidemia. ETOH abuse.  Sonographer:    Clayton Lefort RDCS (AE) Referring Phys: [id number] [facility] LATIF [facility]  Sonographer Comments: Patient is morbidly obese and suboptimal apical window. Image acquisition challenging due to patient body habitus. Limited ability to position patient. IMPRESSIONS  1. Left ventricular ejection fraction, by estimation, is 60 to 65%. The left ventricle has normal function. The left ventricle has no regional wall motion abnormalities. There is severe left ventricular hypertrophy. Left ventricular diastolic parameters  are indeterminate.  2. Right ventricular systolic function was not well visualized. The right ventricular size is normal. There is  severely elevated pulmonary artery systolic pressure.  3. The mitral valve is normal in structure. Mild mitral valve regurgitation. No evidence of mitral stenosis.  4. Tricuspid valve regurgitation is moderate.  5. The aortic valve is tricuspid. Aortic valve regurgitation is not visualized. Mild to moderate aortic valve sclerosis/calcification is present, without any evidence of aortic stenosis.  6. The inferior vena cava is normal in size with greater than 50% respiratory variability, suggesting right atrial pressure of 3 mmHg. FINDINGS  Left Ventricle: Left ventricular ejection fraction, by estimation, is 60 to 65%. The left ventricle has normal function. The left ventricle has no regional wall motion abnormalities. The left ventricular internal cavity size was normal in size. There is  severe left ventricular hypertrophy. Left ventricular diastolic parameters are indeterminate. Right Ventricle: The right ventricular size is normal. Right vetricular wall thickness was not assessed. Right ventricular systolic function was not well visualized. There is severely elevated pulmonary artery systolic pressure. The tricuspid regurgitant  velocity is 3.79 m/s, and with an assumed right atrial pressure of 15 mmHg, the estimated right ventricular systolic pressure is 38.7 mmHg. Left Atrium: Left atrial size was normal in size. Right Atrium: Right atrial size was normal in size. Pericardium: Trivial pericardial effusion is present. The pericardial effusion is posterior to the left ventricle. Mitral Valve: The mitral valve is normal in structure. There is mild thickening of the mitral valve leaflet(s). There is mild calcification of the mitral valve leaflet(s). Normal mobility of the mitral valve leaflets. Mild mitral annular calcification. Mild mitral valve regurgitation. No evidence of mitral valve stenosis. Tricuspid Valve: The tricuspid valve is normal in structure. Tricuspid valve regurgitation is moderate . No evidence  of tricuspid stenosis. Aortic Valve: The aortic valve  is tricuspid. Aortic valve regurgitation is not visualized. Mild to moderate aortic valve sclerosis/calcification is present, without any evidence of aortic stenosis. Aortic valve mean gradient measures 3.0 mmHg. Aortic valve peak gradient measures 4.5 mmHg. Aortic valve area, by VTI measures 2.29 cm. Pulmonic Valve: The pulmonic valve was normal in structure. Pulmonic valve regurgitation is not visualized. No evidence of pulmonic stenosis. Aorta: The aortic root is normal in size and structure. Venous: The inferior vena cava is normal in size with greater than 50% respiratory variability, suggesting right atrial pressure of 3 mmHg. IAS/Shunts: No atrial level shunt detected by color flow Doppler.  LEFT VENTRICLE PLAX 2D LVIDd:         2.70 cm LVIDs:         1.80 cm LV PW:         1.70 cm LV IVS:        1.80 cm LVOT diam:     1.80 cm LV SV:         45 LV SV Index:   25 LVOT Area:     2.54 cm  RIGHT VENTRICLE            IVC RV Basal diam:  2.90 cm    IVC diam: 2.00 cm RV S prime:     7.83 cm/s TAPSE (M-mode): 1.8 cm LEFT ATRIUM             Index       RIGHT ATRIUM           Index LA diam:        3.30 cm 1.84 cm/m  RA Area:     12.90 cm LA Vol (A2C):   82.6 ml 46.13 ml/m RA Volume:   29.80 ml  16.64 ml/m LA Vol (A4C):   72.6 ml 40.55 ml/m LA Biplane Vol: 77.4 ml 43.23 ml/m  AORTIC VALVE AV Area (Vmax):    2.21 cm AV Area (Vmean):   2.08 cm AV Area (VTI):     2.29 cm AV Vmax:           105.60 cm/s AV Vmean:          82.740 cm/s AV VTI:            0.199 m AV Peak Grad:      4.5 mmHg AV Mean Grad:      3.0 mmHg LVOT Vmax:         91.70 cm/s LVOT Vmean:        67.720 cm/s LVOT VTI:          0.179 m LVOT/AV VTI ratio: 0.90  AORTA Ao Root diam: 2.80 cm Ao Asc diam:  3.60 cm TRICUSPID VALVE TR Peak grad:   57.5 mmHg TR Vmax:        379.00 cm/s  SHUNTS Systemic VTI:  0.18 m Systemic Diam: 1.80 cm Jenkins Rouge MD Electronically signed by Jenkins Rouge MD Signature  Date/Time: 03/05/2020/5:01:47 PM    Final    Scheduled Meds: .  HYDROmorphone (DILAUDID) injection  1 mg Intravenous NOW  . scopolamine  1 patch Transdermal Q72H  . sodium chloride flush  3 mL Intravenous Q12H   Continuous Infusions: . sodium chloride 10 mL/hr at 03/06/20 0100  . HYDROmorphone      LOS: 3 days   Kerney Elbe, DO [facility] PAGER is on [place]  If 7PM-7AM, please contact night-coverage [email or web address]

## 2020-03-06 NOTE — Progress Notes (Signed)
Progress Note  Patient Name: [name] [name] Date of Encounter: 03/06/2020  Primary Cardiologist: No primary care provider on file.   Subjective   Pt on bipap. Somnolent  Inpatient Medications    Scheduled Meds: . aspirin EC  81 mg Oral q morning - 10a  . chlordiazePOXIDE  5 mg Oral q morning - 10a  . Chlorhexidine Gluconate Cloth  6 each Topical Daily  . FLUoxetine  20 mg Oral Daily  . folic acid  1 mg Oral Daily  . hyoscyamine  0.375 mg Oral BID  . ipratropium  0.5 mg Nebulization Q6H  . levalbuterol  0.63 mg Nebulization Q6H  . levothyroxine  100 mcg Oral Q0600  . QUEtiapine  200 mg Oral QHS  . simvastatin  20 mg Oral QHS  . sodium chloride flush  3 mL Intravenous Q12H  . venlafaxine XR  37.5 mg Oral Q breakfast  . cyanocobalamin  1,000 mcg Oral Daily   Continuous Infusions: . sodium chloride 10 mL/hr at 03/06/20 0100  . amiodarone 30 mg/hr (03/06/20 0728)  . ampicillin-sulbactam (UNASYN) IV 3 g (03/05/20 2213)  . dextrose 75 mL/hr at 03/06/20 0408   PRN Meds: sodium chloride, acetaminophen **OR** acetaminophen, LORazepam, morphine injection, ondansetron **OR** ondansetron (ZOFRAN) IV, promethazine, white petrolatum, zolpidem   Vital Signs    Vitals:   03/06/20 0400 03/06/20 0402 03/06/20 0437 03/06/20 0500  BP: (!) 132/103 (!) 132/103  116/78  Pulse: (!) 121 (!) 123 (!) 121 (!) 59  Resp: (!) 30 (!) 34 (!) 24 (!) 30  Temp: 99 F (37.2 C)     TempSrc: Axillary     SpO2: (!) 84% 92% 94% 94%  Weight:   80.1 kg   Height:        Intake/Output Summary (Last 24 hours) at 03/06/2020 0751 Last data filed at 03/06/2020 0408 Gross per 24 hour  Intake 2160.15 ml  Output 550 ml  Net 1610.15 ml   Last 3 Weights 03/06/2020 03/05/2020 03/04/2020  Weight (lbs) 176 lb 9.4 oz 176 lb 5.9 oz 169 lb 5 oz  Weight (kg) 80.1 kg 80 kg 76.8 kg      Telemetry    Atrial fib, rates 100-110 bpm - Personally Reviewed  ECG     No AM EKG- Personally Reviewed  Physical Exam    GEN: Somnolent, bipap in place Cardiac: Irreg irreg, tachy.   Respiratory: Clear to auscultation bilaterally. GI: Soft, nontender, non-distended  MS: no LE edema Psych: somnolent  Labs    High Sensitivity Troponin:  No results for input(s): TROPONINIHS in the last 720 hours.    Chemistry Recent Labs  Lab 03/04/20 0706 03/05/20 0314 03/06/20 0343  NA 146* 146* 146*  K 5.1 5.5* 4.8  CL 110 111 109  CO2 20* 18* 22  GLUCOSE 133* 125* 142*  BUN 72* 91* 105*  CREATININE 3.08* 3.83* 3.98*  CALCIUM 8.5* 9.0 9.0  PROT 5.8* 6.2* 6.2*  ALBUMIN 2.5* 2.5* 2.5*  AST 27 86* 96*  ALT 24 44 55*  ALKPHOS 83 146* 227*  BILITOT 1.2 1.3* 1.4*  GFRNONAA 14* 10* 10*  GFRAA 16* 12* 12*  ANIONGAP 16* 17* 15     Hematology Recent Labs  Lab 03/04/20 0706 03/05/20 0314 03/06/20 0343  WBC 18.8* 14.4* 10.4  RBC 3.25* 3.23* 2.80*  HGB 10.0* 10.0* 8.6*  HCT 31.5* 32.6* 27.9*  MCV 96.9 100.9* 99.6  MCH 30.8 31.0 30.7  MCHC 31.7 30.7 30.8  RDW 15.9* 16.4*  16.3*  PLT 68* 72* 81*    BNPNo results for input(s): BNP, PROBNP in the last 168 hours.   DDimer No results for input(s): DDIMER in the last 168 hours.   Radiology    CT CHEST WO CONTRAST  Result Date: 03/05/2020 CLINICAL DATA:  Pneumonia, effusion or abscess suspected, xray done Dyspnea, chronic, unclear etiology EXAM: CT CHEST WITHOUT CONTRAST TECHNIQUE: Multidetector CT imaging of the chest was performed following the standard protocol without IV contrast. COMPARISON:  Most recent radiograph yesterday. No prior CT available. FINDINGS: Cardiovascular: Atherosclerosis of the thoracic aorta. Ectatic ascending aorta at 3.9 cm without frank aneurysm. Conventional branching pattern from the aortic arch. Multi chamber cardiomegaly. There are coronary artery calcifications. No pericardial effusion. Mediastinum/Nodes: Enlarged right lower paratracheal node at 12 mm. Additional shoddy mediastinal lymph nodes which are subcentimeter. Limited  assessment for hilar adenopathy given lack of IV contrast. No esophageal wall thickening. No suspicious thyroid nodule. Lungs/Pleura: Multifocal ground-glass nodularity and consolidation throughout both lungs, right greater than left. Air bronchograms are more prominent on the right. Dense consolidation in the dependent right lower lobe and central right middle and lower lobes no left pleural effusion. Suspect small right pleural effusion, but only minimal pleural fluid compared to degree of consolidation. There is lower lobe bronchial thickening with areas of mucoid impaction and bronchial filling in the right lower lobe segmental bronchi. Upper Abdomen: Streak artifact from spinal fusion hardware partially obscures evaluation. There is also patient motion artifact. Small exophytic cyst from the upper right kidney. Postcholecystectomy. No definite acute finding. Musculoskeletal: Posterior fusion with interbody spacers involving the lower thoracic and upper lumbar spine. Plate and screw fixation of left clavicle. Left proximal humeral arthroplasty. No acute osseous abnormalities are seen. Multilevel degenerative change in the thoracic spine. IMPRESSION: 1. Multifocal ground-glass nodularity and consolidation throughout both lungs, right greater than left. Air bronchograms are more prominent on the right. Findings are consistent with multifocal pneumonia, including atypical viral infection. An element of pulmonary edema is also considered. 2. Dense consolidation in the dependent right lower lobe and central right middle and left lower lobes. There is lower lobe bronchial thickening with areas of mucoid impaction/bronchial filling in the right lower lobe. This may represent more confluent pneumonia or aspiration. 3. Possible small right pleural effusion, but only minimal pleural fluid. 4. Cardiomegaly.  Coronary artery calcifications. 5. Enlarged right lower paratracheal node is likely reactive. Aortic Atherosclerosis  (ICD10-I70.0). Electronically Signed   By: Keith Rake M.D.   On: 03/05/2020 12:35   US RENAL  Result Date: 03/04/2020 CLINICAL DATA:  Oliguria EXAM: RENAL / URINARY TRACT ULTRASOUND COMPLETE COMPARISON:  None. FINDINGS: Right Kidney: Renal measurements: 9.0 x 5.6 x 5.7 cm. = volume: 151 mL. Increased echogenicity is noted. No mass lesion or hydronephrosis is seen. Left Kidney: Renal measurements: 10.1 x 5.9 x 4.8 cm = volume: 149 mL. 1.1 cm cyst is noted in the upper pole. Cortical thinning and increased echogenicity is noted. No mass lesion or hydronephrosis is seen. Bladder: Decompressed by Foley catheter. Other: None. IMPRESSION: Increased echogenicity. Small left renal cyst. No obstructive changes are noted. Electronically Signed   By: Inez Catalina M.D.   On: 03/04/2020 16:41   DG CHEST PORT 1 VIEW  Result Date: 03/06/2020 CLINICAL DATA:  Shortness of breath.  Respiratory distress. EXAM: PORTABLE CHEST 1 VIEW COMPARISON:  CT 03/05/2020.  Chest x-ray 03/04/2020. FINDINGS: Stable cardiomegaly. Diffuse bilateral pulmonary infiltrates/edema again noted without significant change. No pleural effusion or pneumothorax.  Prior thoracolumbar spine fusion. Surgical clips right upper quadrant. Prior ORIF left clavicle. Prior left shoulder replacement. IMPRESSION: 1.  Stable cardiomegaly. 2. Diffuse bilateral pulmonary infiltrates/edema again noted. No significant change. Electronically Signed   By: Marcello Moores  Register   On: 03/06/2020 07:41   ECHOCARDIOGRAM COMPLETE  Result Date: 03/05/2020    ECHOCARDIOGRAM REPORT   Patient Name:   KUMIKO SAUTTER Date of Exam: 03/05/2020 Medical Rec #:  [id number]     Height:       61.0 in Accession #:    [id number]    Weight:       176.4 lb Date of Birth:  10/11/1936     BSA:          1.790 m Patient Age:    63 years      BP:           132/53 mmHg Patient Gender: F             HR:           83 bpm. Exam Location:  Inpatient Procedure: 2D Echo, Cardiac Doppler and Color Doppler  Indications:    Dyspnea 786.09/R06.00  History:        Patient has prior history of Echocardiogram examinations, most                 recent 09/18/2018. CHF, Arrythmias:Atrial Fibrillation; Risk                 Factors:Hypertension and Dyslipidemia. ETOH abuse.  Sonographer:    Clayton Lefort RDCS (AE) Referring Phys: [id number] [facility] LATIF [facility]  Sonographer Comments: Patient is morbidly obese and suboptimal apical window. Image acquisition challenging due to patient body habitus. Limited ability to position patient. IMPRESSIONS  1. Left ventricular ejection fraction, by estimation, is 60 to 65%. The left ventricle has normal function. The left ventricle has no regional wall motion abnormalities. There is severe left ventricular hypertrophy. Left ventricular diastolic parameters  are indeterminate.  2. Right ventricular systolic function was not well visualized. The right ventricular size is normal. There is severely elevated pulmonary artery systolic pressure.  3. The mitral valve is normal in structure. Mild mitral valve regurgitation. No evidence of mitral stenosis.  4. Tricuspid valve regurgitation is moderate.  5. The aortic valve is tricuspid. Aortic valve regurgitation is not visualized. Mild to moderate aortic valve sclerosis/calcification is present, without any evidence of aortic stenosis.  6. The inferior vena cava is normal in size with greater than 50% respiratory variability, suggesting right atrial pressure of 3 mmHg. FINDINGS  Left Ventricle: Left ventricular ejection fraction, by estimation, is 60 to 65%. The left ventricle has normal function. The left ventricle has no regional wall motion abnormalities. The left ventricular internal cavity size was normal in size. There is  severe left ventricular hypertrophy. Left ventricular diastolic parameters are indeterminate. Right Ventricle: The right ventricular size is normal. Right vetricular wall thickness was not assessed. Right ventricular systolic  function was not well visualized. There is severely elevated pulmonary artery systolic pressure. The tricuspid regurgitant  velocity is 3.79 m/s, and with an assumed right atrial pressure of 15 mmHg, the estimated right ventricular systolic pressure is [id number] mmHg. Left Atrium: Left atrial size was normal in size. Right Atrium: Right atrial size was normal in size. Pericardium: Trivial pericardial effusion is present. The pericardial effusion is posterior to the left ventricle. Mitral Valve: The mitral valve is normal in structure. There is mild thickening of the mitral valve  leaflet(s). There is mild calcification of the mitral valve leaflet(s). Normal mobility of the mitral valve leaflets. Mild mitral annular calcification. Mild mitral valve regurgitation. No evidence of mitral valve stenosis. Tricuspid Valve: The tricuspid valve is normal in structure. Tricuspid valve regurgitation is moderate . No evidence of tricuspid stenosis. Aortic Valve: The aortic valve is tricuspid. Aortic valve regurgitation is not visualized. Mild to moderate aortic valve sclerosis/calcification is present, without any evidence of aortic stenosis. Aortic valve mean gradient measures 3.0 mmHg. Aortic valve peak gradient measures 4.5 mmHg. Aortic valve area, by VTI measures 2.29 cm. Pulmonic Valve: The pulmonic valve was normal in structure. Pulmonic valve regurgitation is not visualized. No evidence of pulmonic stenosis. Aorta: The aortic root is normal in size and structure. Venous: The inferior vena cava is normal in size with greater than 50% respiratory variability, suggesting right atrial pressure of 3 mmHg. IAS/Shunts: No atrial level shunt detected by color flow Doppler.  LEFT VENTRICLE PLAX 2D LVIDd:         2.70 cm LVIDs:         1.80 cm LV PW:         1.70 cm LV IVS:        1.80 cm LVOT diam:     1.80 cm LV SV:         45 LV SV Index:   25 LVOT Area:     2.54 cm  RIGHT VENTRICLE            IVC RV Basal diam:  2.90 cm    IVC  diam: 2.00 cm RV S prime:     7.83 cm/s TAPSE (M-mode): 1.8 cm LEFT ATRIUM             Index       RIGHT ATRIUM           Index LA diam:        3.30 cm 1.84 cm/m  RA Area:     12.90 cm LA Vol (A2C):   82.6 ml 46.13 ml/m RA Volume:   29.80 ml  16.64 ml/m LA Vol (A4C):   72.6 ml 40.55 ml/m LA Biplane Vol: 77.4 ml 43.23 ml/m  AORTIC VALVE AV Area (Vmax):    2.21 cm AV Area (Vmean):   2.08 cm AV Area (VTI):     2.29 cm AV Vmax:           105.60 cm/s AV Vmean:          82.740 cm/s AV VTI:            0.199 m AV Peak Grad:      4.5 mmHg AV Mean Grad:      3.0 mmHg LVOT Vmax:         91.70 cm/s LVOT Vmean:        67.720 cm/s LVOT VTI:          0.179 m LVOT/AV VTI ratio: 0.90  AORTA Ao Root diam: 2.80 cm Ao Asc diam:  3.60 cm TRICUSPID VALVE TR Peak grad:   57.5 mmHg TR Vmax:        379.00 cm/s  SHUNTS Systemic VTI:  0.18 m Systemic Diam: 1.80 cm Jenkins Rouge MD Electronically signed by Jenkins Rouge MD Signature Date/Time: 03/05/2020/5:01:47 PM    Final     Cardiac Studies   Echo 03/05/20: 1. Left ventricular ejection fraction, by estimation, is 60 to 65%. The  left ventricle has normal function. The left ventricle has no  regional  wall motion abnormalities. There is severe left ventricular hypertrophy.  Left ventricular diastolic parameters  are indeterminate.  2. Right ventricular systolic function was not well visualized. The right  ventricular size is normal. There is severely elevated pulmonary artery  systolic pressure.  3. The mitral valve is normal in structure. Mild mitral valve  regurgitation. No evidence of mitral stenosis.  4. Tricuspid valve regurgitation is moderate.  5. The aortic valve is tricuspid. Aortic valve regurgitation is not  visualized. Mild to moderate aortic valve sclerosis/calcification is  present, without any evidence of aortic stenosis.  6. The inferior vena cava is normal in size with greater than 50%  respiratory variability, suggesting right atrial pressure  of 3 mmHg.   Patient Profile     [name] [name] is a 84 y.o. female with a hx of chronic diastolic HF, HTN, PAF (not on AC 2/2 to falls), anxiety/depression, ETOH abuse and psoriatic arthritis who is being seen today for the evaluation of Afib at the request of Dr. Alfredia Ferguson  Assessment & Plan    1. Atrial fib with RVR: Rates better controlled now on IV amiodarone. She did not tolerate IV Lopressor due to hypotension. Continue IV amiodarone today while she is recovering from her pneumonia. Not a candidate for anti-coagulation.  Continue ASA.    For questions or updates, please contact [place] Please consult [email or web address] for contact info under        Signed, Lauree Chandler, MD  03/06/2020, 7:51 AM

## 2020-03-06 NOTE — Progress Notes (Addendum)
Pt appeared to have alteration of consciousness, drowsiness, tremors and arouseable to be awakened with incomprehensible speech, oriented to herself, able to followed simple commands such as performing hand grip and opening her eyes. Able to rest after Ativan 5 mg and morphine 1 mg IV given PRN.  She's continuing 15 LPM of non-rebreathe face mask, Fio2 100%, her SPO2 97-98%, RR 19-28. No labor breathing. Auscultated lungs had rhonchi with crackles bilaterally. She 's able to have weak non productive cough. RT evaluated Pt able to tolerated without BiPAP.  At 04:30 Pt Spo2 dropped to 83-84%, BiPAP put on by RT assessed at bedside. Breathing treatment given. SPO2 upt to 94%.  HR 114-130, Atrial fibrillation with unsustained RVR, on Amiodarone bolus x 1 then gtt, maintenance at 30 mg/hr.  BP 94/76- 122/ 62 mmHg Temp 98 - 98.2  axillary,  NPO, unable to give oral med due to her status. On 5% dextrose at 75 ml/hr.  On Foley cath with low urine out put. 200 ml for night shift.  Will continue to monitor.  Kennyth Lose, RN

## 2020-03-07 DIAGNOSIS — E8809 Other disorders of plasma-protein metabolism, not elsewhere classified: Secondary | ICD-10-CM | POA: Diagnosis present

## 2020-03-07 DIAGNOSIS — J9601 Acute respiratory failure with hypoxia: Secondary | ICD-10-CM

## 2020-03-07 DIAGNOSIS — D638 Anemia in other chronic diseases classified elsewhere: Secondary | ICD-10-CM | POA: Diagnosis present

## 2020-03-07 DIAGNOSIS — K7200 Acute and subacute hepatic failure without coma: Secondary | ICD-10-CM | POA: Diagnosis present

## 2020-03-07 DIAGNOSIS — R700 Elevated erythrocyte sedimentation rate: Secondary | ICD-10-CM | POA: Diagnosis present

## 2020-03-07 DIAGNOSIS — R809 Proteinuria, unspecified: Secondary | ICD-10-CM | POA: Diagnosis present

## 2020-03-07 DIAGNOSIS — G319 Degenerative disease of nervous system, unspecified: Secondary | ICD-10-CM

## 2020-03-07 DIAGNOSIS — R7982 Elevated C-reactive protein (CRP): Secondary | ICD-10-CM | POA: Diagnosis present

## 2020-03-07 DIAGNOSIS — R739 Hyperglycemia, unspecified: Secondary | ICD-10-CM | POA: Diagnosis present

## 2020-03-07 DIAGNOSIS — E870 Hyperosmolality and hypernatremia: Secondary | ICD-10-CM

## 2020-03-07 DIAGNOSIS — Z79899 Other long term (current) drug therapy: Secondary | ICD-10-CM

## 2020-03-07 LAB — CULTURE, BLOOD (ROUTINE X 2)
Culture: NO GROWTH
Culture: NO GROWTH

## 2020-03-13 NOTE — Progress Notes (Addendum)
PT appears apneic and pulseless. No signs of life. Daughter at bedside. 2 RNS present no heart tones confirmed. Daughter given pts wedding ring by request. [facility] and pallative notified. Jerald Kief, RN

## 2020-03-13 NOTE — Death Summary Note (Signed)
DEATH SUMMARY   Patient Details  Name: [name] [name] MRN: [id number] DOB: 1936/07/26  Admission/Discharge Information   Admit Date:  March 03, 2020  Date of Death: Date of Death: Mar 08, 2020  Time of Death: Time of Death: 80  Length of Stay: 4  Referring Physician: [name], MD   Reason(s) for Hospitalization  Weakness and Hypothermia in a patient who at baseline ambulates with a walker and normally sleeps downstairs on the couch  Diagnoses  Preliminary cause of death:    Acute cardiopulmonary arrest in the setting of acute respiratory failure secondary to sepsis due to multilobar pneumonia likely aspiration requiring noninvasive positive ventilation complicated by atrial fibrillation with RVR and acute kidney injury on CKD stage III and with an initial SIRS picture with hypothermia progressing to multi-organ failure including acute liver failure, progressive renal failure, progressive respiratory failure.  Secondary Diagnoses (including complications and co-morbidities):   Hypothermia, sepsis: Acute.  Acute liver injury Multi-organ failure Initial rectal temperature was noted to be 87.9 F with WBC elevated at 10.7. CXR noted possible bronchitis with an infiltrate of the RLL consistent with pneumonia. Inflammatory markers were elevated including CPR and ESR. UA noted proteinuria and initial cultures grew multiple species and f/u cultures negative (after initiation of antibiotics). BCUX negative. Pt was placed on empiric hugger. Despite aggressive measures, the patient's condition continued to deteriorate and palliative care discussions were held with the patient/family with decision to transition the patient to comfort care.  All medications were subsequently discontinued.  The patient's encephalopathy continued and she progressed to lethargy and unresponsiveness. She was placed on a dilaudid infusion for comfort care.  Acute metabolic encephalopathy w/ history of dementia,  worsened Family had reported the patient was not able to walk over the last 2 days and had developed acute aphasia in the setting of brain atrophy, microvascular cerebrovascular disease and evidence of multiple strokes. Encephalopathy multi-factorial with hypothermia, sepsis, and cerebrovascular disease/dementia contributory.   Lower extremity weakness, Acute. Patient reportedly not able to get up and walk with use of a walker as previously done over the last 2 days. MRI was negative for acute stroke, but did show evidence of multiple prior strokes and brain atrophy. Given progressive decline, was not evaluated by PT/OT.   Diastolic CHF, Chronic.  Last EF noted to be 60 to 65% without significant wall motion abnormalities appreciated in 09/2018. The patient was likely volume depleted on admission, given her AKI, and developed pulmonary edema with fluid volume resuscitation. BUN/creatinine steadily worsened with attempts to diurese.  Essential Hypertension but now hypotensive On admission blood pressures elevated up to 188/81. Initially, usual BP medications continued, but BP dropped in the setting of the development of afib w/ RVR. Medications ultimately discontinued in light of transition to comfort measures.  Hypothyroidism Last TSH noted to be 2.658 on 10/18/2019. TSH was 0.861; FT4 ok. Maintained on levothyroxine until transition to comfort care.  Depression; Chronic. Prozac was discontinued when she was transitioned to comfort care.  Irritable bowel syndrome; Chronic.  Hyoscyamine was discontinued when she was transitioned to comfort care.  Insomina/benzodiazepine use UDS + benzodiazepines, with known history of dependency. At home patient had been taking 10 mg of Ambien and 200 mg of Seroquel nightly. Discontinued when transitioned to comfort care.  Thrombocytopenia Likely an inflammatory response rxn and indicative of severe multi-organ failure. No active bleeding  noted.  Paroxysmal atrial fibrillation with RVR Went into RVR with bursts that were sustained. Felt to be a poor anticoagulation candidate despite  having a CHA2DS2-VASc score of at least 5. Cardiology initially consulted with attempts to control heart rate with beta blockade, however unable to tolerate due to hypotension.  Attempted to control rate with IV amiodarone but ultimately this was discontinued when the patient was transitioned to comfort care.  Acute hypoxic respiratory failure, worsened Aspiration SLP evaluated patient and recommended NPO status. The patient was noted to have active vomiting while in the hospital. She also had progressive respiratory failure requiring Bipap/non-rebreather mask. Aspiration pneumonia initially treated with Unasyn.    AKI on mild CKD, worsening  Elevated anion gap Metabolic Acidosis Hypernatremia, mild Hyperkalemia Hypocalcemia Proteinuria Hypernatremia Hyperphosphatemia Presented with AKI in the setting of stage III CKD. Despite fluid volume resuscitation, renal function worsened and electrolyte imbalances persisted. Renal ultrasound negative for obstruction. Nephrology consulted and findings felt to be consistent with hemodynamically mediated ATN in the setting of sepsis. She was not felt to be a HD candidate.   Obesity Estimated body mass index was 33.37 kg/m as calculated from the following:   Height as of this encounter: 5' 1" (1.549 m).   Weight as of this encounter: 80.1 kg. 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)   Hyperkalemia   Hypothermia   Thrombocytopenia (HCC)   Weakness   Chronic diastolic congestive heart failure (HCC)   Somnolence   Metabolic encephalopathy   Palliative care by specialist   Terminal care   Depression   Generalized weakness   Dementia (HCC)   SIRS (systemic inflammatory response syndrome) (HCC)   CKD (chronic kidney disease), stage III   DNR (do not resuscitate)   Acute respiratory failure with hypoxia (HCC)   Tachypnea   Atrial fibrillation with rapid ventricular response (HCC)   Hyperglycemia   Hypocalcemia   ALF (acute liver failure)   Proteinuria   Chronic prescription benzodiazepine use   ESR raised   CRP elevated   Anemia of chronic disease   Hypoalbuminemia   Hypernatremia   Hyperphosphatemia   Brain atrophy [facility])   Brief Hospital Course (including significant findings, care, treatment, and services provided and events leading to death)  HPI per Dr. Fuller Plan on 03/01/2020 [name] Lueras Dixonis a 84 y.o.femalewith medical history significant ofdiastolic CHF, paroxysmal atrial fibrillation, CKD, hypothyroidism, and dementia. History is obtained from review of records and daughter over the phone. At baseline patient ambulates with the use of a walker and normally sleeps downstairs on the couch. For the last 2 days she had not been able to get up off of the couch normal. Her daughter had placed a potty chair beside her, but noted that she was unable to get up and ultimately placed anadult brief on her. She had not been wanting to eat or drink much despite her daughter trying. This morning when she awoke the patient was not able to speak. The patient's daughter appears to manage her medications and had not had any recent changes. A friend reportedly came over and called EMS. Patient currently able to talk, but does not report any complaints at this time. She is  unaware of why she is here in the hospital. Daughter noted similar episode  with the patient not being able to walk back in November 2020 where she had to go to collapse for rehab. During that hospitalization as well it appears she met SIRS criteria, but did not require any antibiotics as no clear source of infection was found.  EMS found the patient in her bed that was in a sun room for insulation soiled with urine, food crumbs caked around her mouth, and with dry mucous membranes. Her initial temperature was checked and she was noted to be hypothermic.   ED Course:Upon admission into the emergency department patient was noted to be87.9, pulse 53-58, aspirations 18-22, blood pressure 156/64-188/81, and O2 saturations maintained on room air. Labs significant for WBC 10.7, BUN 57, creatinine 1.38, CK 50, lactic acid 1.4. Chest x-ray showed possible signs of bronchitis but was of low lung volumes. Urinalysis did not show any significant signs of infection. Patient was given 1 L normal saline IV fluids and placed on a bear hugger. TRH called to admit.  **Interim History Patient's temperature initally improved with Bair Hugger, however she remained encephalopathic. She had a slow response and was using some accessory muscles to breathe still IV fluids that she initially got were stopped and later that afternoon on 03/03/2020 she vomited and likely aspirated.  Chest x-ray showed worsening right base opacity and she was started on IV Unasyn.  SLP was evaluated and recommended the patient to be n.p.o. an MRI of the brain was done given her encephalopathy was negative.  She had another episode of vomiting while she was on the MRI table my partner and colleague's discussion with the patient's daughter she was changed to DNR.  On 03/04/2020 vomiting aspiration.  Because of concern of her volume status she was given Lasix however her renal function started trending upwards.  Lasix was stopped and she is started back on fluids.  Her renal function was further worked up with a renal  ultrasound.  And then further worsened.  On the evening of 03/04/2020 to 03/05/2020 patient went into atrial fibrillation with RVR with intermittent bursts into the 140s.  Cardiology was further consulted for assistance with management given her significant rates.  Patient is a high risk for a stroke as she is not a candidate for anticoagulation given her multiple falls.  Nephrology was also consulted on 03/05/2020 because of her worsening renal labs.  Started on D5 W for fluid resuscitation.  Because of the patient's high risk for decompensation further, palliative care was consulted for goals of care discussion.  Heart rates remained relatively uncontrolled so she is started on IV amiodarone drip  On the evening of 03/05/2020-03/06/20, the patient further decompensated and ended up on BiPAP.  She was encephalopathic and essentially unresponsive on the BiPAP however she did respond to painful stimuli.  Pulmonary was consulted for further assistance with her respiratory status given her worsening respiratory condition.  Pulmonary had no further recommendations and recommended comfort care given her high risk for further decompensation.  Patient's renal function continued to worsen and her heart rates were still uncontrolled even on the IV amiodarone. Palliative care was consulted and after goals of care discussion with the patient's daughter decision was made to transition the patient to full comfort she is started on a Dilaudid drip.  All aggressive measures were then ceased and her BiPAP was removed and she is transition to nasal cannula.  She further decompensated and started  having shallow breathing this morning and passed away at 1350 as we would have expected hospital death given her high risk of decompensation in the setting of all her multiple comorbidities.     Pertinent Labs and Studies  Significant Diagnostic Studies DG Abd 1 View  Result Date: 03/04/2020 CLINICAL DATA:  Nausea and vomiting. EXAM:  ABDOMEN - 1 VIEW COMPARISON:  CT 10/18/2019.  Abdomen 03/11/2017. FINDINGS: Surgical clips and staples again noted over the abdomen. No bowel distention or free air. Thoracolumbar and lumbosacral spine fusion again noted. Hardware intact. Right base atelectasis/infiltrate. IMPRESSION: 1.  No acute intra-abdominal abnormality identified. 2.  Right base atelectasis/infiltrate cannot be excluded Electronically Signed   By: Garber   On: 03/04/2020 08:00   CT HEAD WO CONTRAST  Result Date: 02/22/2020 CLINICAL DATA:  AMS; POSSIBLE UTI EXAM: CT HEAD WITHOUT CONTRAST TECHNIQUE: Contiguous axial images were obtained from the base of the skull through the vertex without intravenous contrast. COMPARISON:  CT head 10/18/2019 FINDINGS: Brain: No evidence of acute infarction, hemorrhage, hydrocephalus, extra-axial collection or mass lesion/mass effect. Global atrophy. Periventricular white matter hypodensity consistent with chronic small vessel ischemic change. 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 Small old left lacunar infarct in the lentiform nucleus. Vascular: No hyperdense vessel or unexpected calcification. Skull: Normal. Negative for fracture or focal lesion. Sinuses/Orbits: No acute finding. Other: None. IMPRESSION: No acute intracranial abnormality. Electronically Signed   By: Audie Pinto M.D.   On: 02/24/2020 18:26   CT CHEST WO CONTRAST  Result Date: 03/05/2020 CLINICAL DATA:  Pneumonia, effusion or abscess suspected, xray done Dyspnea, chronic, unclear etiology EXAM: CT CHEST WITHOUT CONTRAST TECHNIQUE: Multidetector CT imaging of the chest was performed following the standard protocol without IV contrast. COMPARISON:  Most recent radiograph yesterday. No prior CT available. FINDINGS: Cardiovascular: Atherosclerosis of the thoracic aorta. Ectatic ascending aorta at 3.9 cm without frank aneurysm. Conventional branching pattern from the aortic arch. Multi  chamber cardiomegaly. There are coronary artery calcifications. No pericardial effusion. Mediastinum/Nodes: Enlarged right lower paratracheal node at 12 mm. Additional shoddy mediastinal lymph nodes which are subcentimeter. Limited assessment for hilar adenopathy given lack of IV contrast. No esophageal wall thickening. No suspicious thyroid nodule. Lungs/Pleura: Multifocal ground-glass nodularity and consolidation throughout both lungs, right greater than left. Air bronchograms are more prominent on the right. Dense consolidation in the dependent right lower lobe and central right middle and lower lobes no left pleural effusion. Suspect small right pleural effusion, but only minimal pleural fluid compared to degree of consolidation. There is lower lobe bronchial thickening with areas of mucoid impaction and bronchial filling in the right lower lobe segmental bronchi. Upper Abdomen: Streak artifact from spinal fusion hardware partially obscures evaluation. There is also patient motion artifact. Small exophytic cyst from the upper right kidney. Postcholecystectomy. No definite acute finding. Musculoskeletal: Posterior fusion with interbody spacers involving the lower thoracic and upper lumbar spine. Plate and screw fixation of left clavicle. Left proximal humeral arthroplasty. No acute osseous abnormalities are seen. Multilevel degenerative change in the thoracic spine. IMPRESSION: 1. Multifocal ground-glass nodularity and consolidation throughout both lungs, right greater than left. Air bronchograms are more prominent on the right. Findings are consistent with multifocal pneumonia, including atypical viral infection. An element of pulmonary edema is also considered. 2. Dense consolidation in the dependent right lower lobe and central right middle and left lower lobes. There is lower lobe bronchial thickening with areas of mucoid impaction/bronchial filling in the  right lower lobe. This may represent more confluent  pneumonia or aspiration. 3. Possible small right pleural effusion, but only minimal pleural fluid. 4. Cardiomegaly.  Coronary artery calcifications. 5. Enlarged right lower paratracheal node is likely reactive. Aortic Atherosclerosis (ICD10-I70.0). Electronically Signed   By: Keith Rake M.D.   On: 03/05/2020 12:35   MR BRAIN WO CONTRAST  Result Date: 03/03/2020 CLINICAL DATA:  Encephalopathy EXAM: MRI HEAD WITHOUT CONTRAST TECHNIQUE: Multiplanar, multiecho pulse sequences of the brain and surrounding structures were obtained without intravenous contrast. COMPARISON:  06/05/2019. FINDINGS: Brain: There is no acute infarction or intracranial hemorrhage. There is no intracranial mass, mass effect, or edema. There is no hydrocephalus or extra-axial fluid collection. Patchy foci of T2 hyperintensity in the supratentorial and pontine white matter are nonspecific but may reflect mild chronic microvascular ischemic changes similar to the prior study. Chronic small vessel infarcts of bilateral basal ganglia are noted with new involvement of the right caudate. Vascular: Major vessel flow voids at the skull base are preserved. Skull and upper cervical spine: Normal marrow signal is preserved. Sinuses/Orbits: Trace mucosal thickening.  No acute orbital finding. Other: Sella is unremarkable. Patchy right mastoid fluid opacification. IMPRESSION: No acute infarction, hemorrhage, or mass. Mild chronic microvascular ischemic changes. Chronic small vessel infarcts of basal ganglia with new involvement of right caudate. Electronically Signed   By: Macy Mis M.D.   On: 03/03/2020 16:31   US RENAL  Result Date: 03/04/2020 CLINICAL DATA:  Oliguria EXAM: RENAL / URINARY TRACT ULTRASOUND COMPLETE COMPARISON:  None. FINDINGS: Right Kidney: Renal measurements: 9.0 x 5.6 x 5.7 cm. = volume: 151 mL. Increased echogenicity is noted. No mass lesion or hydronephrosis is seen. Left Kidney: Renal measurements: 10.1 x 5.9 x 4.8 cm  = volume: 149 mL. 1.1 cm cyst is noted in the upper pole. Cortical thinning and increased echogenicity is noted. No mass lesion or hydronephrosis is seen. Bladder: Decompressed by Foley catheter. Other: None. IMPRESSION: Increased echogenicity. Small left renal cyst. No obstructive changes are noted. Electronically Signed   By: Inez Catalina M.D.   On: 03/04/2020 16:41   DG CHEST PORT 1 VIEW  Result Date: 03/06/2020 CLINICAL DATA:  Shortness of breath.  Respiratory distress. EXAM: PORTABLE CHEST 1 VIEW COMPARISON:  CT 03/05/2020.  Chest x-ray 03/04/2020. FINDINGS: Stable cardiomegaly. Diffuse bilateral pulmonary infiltrates/edema again noted without significant change. No pleural effusion or pneumothorax. Prior thoracolumbar spine fusion. Surgical clips right upper quadrant. Prior ORIF left clavicle. Prior left shoulder replacement. IMPRESSION: 1.  Stable cardiomegaly. 2. Diffuse bilateral pulmonary infiltrates/edema again noted. No significant change. Electronically Signed   By: Marcello Moores  Register   On: 03/06/2020 07:41   DG CHEST PORT 1 VIEW  Result Date: 03/04/2020 CLINICAL DATA:  Increasing shortness of breath EXAM: PORTABLE CHEST 1 VIEW COMPARISON:  Yesterday FINDINGS: Patchy bilateral airspace disease most confluent in the right upper lobe where there has been definite progression. No visible effusion or pneumothorax. Chronic cardiomegaly. Mediastinal contours are distorted by rightward rotation. Extensive spinal fusion and postoperative left shoulder girdle. IMPRESSION: Worsening multifocal pneumonia. Electronically Signed   By: Monte Fantasia M.D.   On: 03/04/2020 04:11   DG CHEST PORT 1 VIEW  Result Date: 03/03/2020 CLINICAL DATA:  Shortness of breath. EXAM: PORTABLE CHEST 1 VIEW COMPARISON:  Chest x-ray from yesterday. FINDINGS: The patient is rotated to the right. The heart size and mediastinal contours are within normal limits. Normal pulmonary vascularity. Similar appearing peribronchial  thickening with increasing opacity and interstitial prominence  at the right lung base. No pneumothorax or large pleural effusion. No acute osseous abnormality. IMPRESSION: Increasing opacity at the right lung base could reflect atelectasis or pneumonia. Electronically Signed   By: Titus Dubin M.D.   On: 03/03/2020 11:22   DG Chest Portable 1 View  Result Date: 02/18/2020 CLINICAL DATA:  84 year old female with history of altered mental status. Urinary tract infection. EXAM: PORTABLE CHEST 1 VIEW COMPARISON:  Chest x-ray 10/18/2019. FINDINGS: Lung volumes are low. Mild diffuse peribronchial cuffing and areas of interstitial prominence throughout the lungs bilaterally, which may reflect bronchitis. No acute consolidative airspace disease. No definite pleural effusions. No evidence of pulmonary edema. Heart size is normal. Upper mediastinal contours are within normal limits. Aortic atherosclerosis. Status post ORIF in the distal left clavicle. Status post left shoulder arthroplasty. Orthopedic fixation hardware in the lower thoracic and lumbar spine, incompletely imaged. IMPRESSION: 1. Low lung volumes with findings suggestive of potential bronchitis. Electronically Signed   By: Vinnie Langton M.D.   On: 03/11/2020 15:20   ECHOCARDIOGRAM COMPLETE  Result Date: 03/05/2020    ECHOCARDIOGRAM REPORT   Patient Name:   MADYSYN HANKEN Date of Exam: 03/05/2020 Medical Rec #:  [id number]     Height:       61.0 in Accession #:    [id number]    Weight:       176.4 lb Date of Birth:  03/06/1936     BSA:          1.790 m Patient Age:    73 years      BP:           132/53 mmHg Patient Gender: F             HR:           83 bpm. Exam Location:  Inpatient Procedure: 2D Echo, Cardiac Doppler and Color Doppler Indications:    Dyspnea 786.09/R06.00  History:        Patient has prior history of Echocardiogram examinations, most                 recent 09/18/2018. CHF, Arrythmias:Atrial Fibrillation; Risk                  Factors:Hypertension and Dyslipidemia. ETOH abuse.  Sonographer:    Clayton Lefort RDCS (AE) Referring Phys: [id number] [facility] LATIF [facility]  Sonographer Comments: Patient is morbidly obese and suboptimal apical window. Image acquisition challenging due to patient body habitus. Limited ability to position patient. IMPRESSIONS  1. Left ventricular ejection fraction, by estimation, is 60 to 65%. The left ventricle has normal function. The left ventricle has no regional wall motion abnormalities. There is severe left ventricular hypertrophy. Left ventricular diastolic parameters  are indeterminate.  2. Right ventricular systolic function was not well visualized. The right ventricular size is normal. There is severely elevated pulmonary artery systolic pressure.  3. The mitral valve is normal in structure. Mild mitral valve regurgitation. No evidence of mitral stenosis.  4. Tricuspid valve regurgitation is moderate.  5. The aortic valve is tricuspid. Aortic valve regurgitation is not visualized. Mild to moderate aortic valve sclerosis/calcification is present, without any evidence of aortic stenosis.  6. The inferior vena cava is normal in size with greater than 50% respiratory variability, suggesting right atrial pressure of 3 mmHg. FINDINGS  Left Ventricle: Left ventricular ejection fraction, by estimation, is 60 to 65%. The left ventricle has normal function. The left ventricle has no regional wall motion abnormalities. The  left ventricular internal cavity size was normal in size. There is  severe left ventricular hypertrophy. Left ventricular diastolic parameters are indeterminate. Right Ventricle: The right ventricular size is normal. Right vetricular wall thickness was not assessed. Right ventricular systolic function was not well visualized. There is severely elevated pulmonary artery systolic pressure. The tricuspid regurgitant  velocity is 3.79 m/s, and with an assumed right atrial pressure of 15 mmHg, the estimated  right ventricular systolic pressure is 25.0 mmHg. Left Atrium: Left atrial size was normal in size. Right Atrium: Right atrial size was normal in size. Pericardium: Trivial pericardial effusion is present. The pericardial effusion is posterior to the left ventricle. Mitral Valve: The mitral valve is normal in structure. There is mild thickening of the mitral valve leaflet(s). There is mild calcification of the mitral valve leaflet(s). Normal mobility of the mitral valve leaflets. Mild mitral annular calcification. Mild mitral valve regurgitation. No evidence of mitral valve stenosis. Tricuspid Valve: The tricuspid valve is normal in structure. Tricuspid valve regurgitation is moderate . No evidence of tricuspid stenosis. Aortic Valve: The aortic valve is tricuspid. Aortic valve regurgitation is not visualized. Mild to moderate aortic valve sclerosis/calcification is present, without any evidence of aortic stenosis. Aortic valve mean gradient measures 3.0 mmHg. Aortic valve peak gradient measures 4.5 mmHg. Aortic valve area, by VTI measures 2.29 cm. Pulmonic Valve: The pulmonic valve was normal in structure. Pulmonic valve regurgitation is not visualized. No evidence of pulmonic stenosis. Aorta: The aortic root is normal in size and structure. Venous: The inferior vena cava is normal in size with greater than 50% respiratory variability, suggesting right atrial pressure of 3 mmHg. IAS/Shunts: No atrial level shunt detected by color flow Doppler.  LEFT VENTRICLE PLAX 2D LVIDd:         2.70 cm LVIDs:         1.80 cm LV PW:         1.70 cm LV IVS:        1.80 cm LVOT diam:     1.80 cm LV SV:         45 LV SV Index:   25 LVOT Area:     2.54 cm  RIGHT VENTRICLE            IVC RV Basal diam:  2.90 cm    IVC diam: 2.00 cm RV S prime:     7.83 cm/s TAPSE (M-mode): 1.8 cm LEFT ATRIUM             Index       RIGHT ATRIUM           Index LA diam:        3.30 cm 1.84 cm/m  RA Area:     12.90 cm LA Vol (A2C):   82.6 ml 46.13  ml/m RA Volume:   29.80 ml  16.64 ml/m LA Vol (A4C):   72.6 ml 40.55 ml/m LA Biplane Vol: 77.4 ml 43.23 ml/m  AORTIC VALVE AV Area (Vmax):    2.21 cm AV Area (Vmean):   2.08 cm AV Area (VTI):     2.29 cm AV Vmax:           105.60 cm/s AV Vmean:          82.740 cm/s AV VTI:            0.199 m AV Peak Grad:      4.5 mmHg AV Mean Grad:      3.0 mmHg LVOT Vmax:  91.70 cm/s LVOT Vmean:        67.720 cm/s LVOT VTI:          0.179 m LVOT/AV VTI ratio: 0.90  AORTA Ao Root diam: 2.80 cm Ao Asc diam:  3.60 cm TRICUSPID VALVE TR Peak grad:   57.5 mmHg TR Vmax:        379.00 cm/s  SHUNTS Systemic VTI:  0.18 m Systemic Diam: 1.80 cm Jenkins Rouge MD Electronically signed by Jenkins Rouge MD Signature Date/Time: 03/05/2020/5:01:47 PM    Final    ECHOCARDIOGRAM IMPRESSIONS    1. Left ventricular ejection fraction, by estimation, is 60 to 65%. The  left ventricle has normal function. The left ventricle has no regional  wall motion abnormalities. There is severe left ventricular hypertrophy.  Left ventricular diastolic parameters  are indeterminate.  2. Right ventricular systolic function was not well visualized. The right  ventricular size is normal. There is severely elevated pulmonary artery  systolic pressure.  3. The mitral valve is normal in structure. Mild mitral valve  regurgitation. No evidence of mitral stenosis.  4. Tricuspid valve regurgitation is moderate.  5. The aortic valve is tricuspid. Aortic valve regurgitation is not  visualized. Mild to moderate aortic valve sclerosis/calcification is  present, without any evidence of aortic stenosis.  6. The inferior vena cava is normal in size with greater than 50%  respiratory variability, suggesting right atrial pressure of 3 mmHg.   FINDINGS  Left Ventricle: Left ventricular ejection fraction, by estimation, is 60  to 65%. The left ventricle has normal function. The left ventricle has no  regional wall motion abnormalities. The  left ventricular internal cavity  size was normal in size. There is  severe left ventricular hypertrophy. Left ventricular diastolic  parameters are indeterminate.   Right Ventricle: The right ventricular size is normal. Right vetricular  wall thickness was not assessed. Right ventricular systolic function was  not well visualized. There is severely elevated pulmonary artery systolic  pressure. The tricuspid regurgitant  velocity is 3.79 m/s, and with an assumed right atrial pressure of 15  mmHg, the estimated right ventricular systolic pressure is 93.2 mmHg.   Left Atrium: Left atrial size was normal in size.   Right Atrium: Right atrial size was normal in size.   Pericardium: Trivial pericardial effusion is present. The pericardial  effusion is posterior to the left ventricle.   Mitral Valve: The mitral valve is normal in structure. There is mild  thickening of the mitral valve leaflet(s). There is mild calcification of  the mitral valve leaflet(s). Normal mobility of the mitral valve leaflets.  Mild mitral annular calcification.  Mild mitral valve regurgitation. No evidence of mitral valve stenosis.   Tricuspid Valve: The tricuspid valve is normal in structure. Tricuspid  valve regurgitation is moderate . No evidence of tricuspid stenosis.   Aortic Valve: The aortic valve is tricuspid. Aortic valve regurgitation is  not visualized. Mild to moderate aortic valve sclerosis/calcification is  present, without any evidence of aortic stenosis. Aortic valve mean  gradient measures 3.0 mmHg. Aortic  valve peak gradient measures 4.5 mmHg. Aortic valve area, by VTI measures  2.29 cm.   Pulmonic Valve: The pulmonic valve was normal in structure. Pulmonic valve  regurgitation is not visualized. No evidence of pulmonic stenosis.   Aorta: The aortic root is normal in size and structure.   Venous: The inferior vena cava is normal in size with greater than 50%  respiratory variability,  suggesting right atrial pressure  of 3 mmHg.   IAS/Shunts: No atrial level shunt detected by color flow Doppler.     LEFT VENTRICLE  PLAX 2D  LVIDd:     2.70 cm  LVIDs:     1.80 cm  LV PW:     1.70 cm  LV IVS:    1.80 cm  LVOT diam:   1.80 cm  LV SV:     45  LV SV Index:  25  LVOT Area:   2.54 cm     RIGHT VENTRICLE      IVC  RV Basal diam: 2.90 cm  IVC diam: 2.00 cm  RV S prime:   7.83 cm/s  TAPSE (M-mode): 1.8 cm   LEFT ATRIUM       Index    RIGHT ATRIUM      Index  LA diam:    3.30 cm 1.84 cm/m RA Area:   12.90 cm  LA Vol (A2C):  82.6 ml 46.13 ml/m RA Volume:  29.80 ml 16.64 ml/m  LA Vol (A4C):  72.6 ml 40.55 ml/m  LA Biplane Vol: 77.4 ml 43.23 ml/m  AORTIC VALVE  AV Area (Vmax):  2.21 cm  AV Area (Vmean):  2.08 cm  AV Area (VTI):   2.29 cm  AV Vmax:      105.60 cm/s  AV Vmean:     82.740 cm/s  AV VTI:      0.199 m  AV Peak Grad:   4.5 mmHg  AV Mean Grad:   3.0 mmHg  LVOT Vmax:     91.70 cm/s  LVOT Vmean:    67.720 cm/s  LVOT VTI:     0.179 m  LVOT/AV VTI ratio: 0.90    AORTA  Ao Root diam: 2.80 cm  Ao Asc diam: 3.60 cm   TRICUSPID VALVE  TR Peak grad:  57.5 mmHg  TR Vmax:    379.00 cm/s    SHUNTS  Systemic VTI: 0.18 m  Systemic Diam: 1.80 cm   Microbiology Recent Results (from the past 240 hour(s))  Culture, blood (Routine x 2)     Status: None   Collection Time: 02/20/2020  1:59 PM   Specimen: BLOOD  Result Value Ref Range Status   Specimen Description BLOOD BLOOD LEFT HAND  Final   Special Requests   Final    BOTTLES DRAWN AEROBIC AND ANAEROBIC Blood Culture results may not be optimal due to an inadequate volume of blood received in culture bottles   Culture   Final    NO GROWTH 5 DAYS Performed at [facility], [address], [place], [place] [address]    Report Status 03/24/2020 FINAL  Final  Urine culture     Status:  Abnormal   Collection Time: 02/25/2020  1:59 PM   Specimen: Urine, Random  Result Value Ref Range Status   Specimen Description URINE, RANDOM  Final   Special Requests   Final    NONE Performed at [facility], [place] [address] [place], [place] [address]    Culture MULTIPLE SPECIES PRESENT, SUGGEST RECOLLECTION (A)  Final   Report Status 03/03/2020 FINAL  Final  Culture, blood (Routine x 2)     Status: None   Collection Time: 02/27/2020  3:48 PM   Specimen: BLOOD RIGHT WRIST  Result Value Ref Range Status   Specimen Description BLOOD RIGHT WRIST  Final   Special Requests   Final    BOTTLES DRAWN AEROBIC AND ANAEROBIC Blood Culture results  may not be optimal due to an excessive volume of blood received in culture bottles   Culture   Final    NO GROWTH 5 DAYS Performed at [facility], [place] [address] [place], [place] [address]    Report Status 03-10-20 FINAL  Final  SARS CORONAVIRUS 2 (TAT 6-24 HRS) Nasopharyngeal Nasopharyngeal Swab     Status: None   Collection Time: 02/21/2020  4:44 PM   Specimen: Nasopharyngeal Swab  Result Value Ref Range Status   SARS Coronavirus 2 NEGATIVE NEGATIVE Final    Comment: (NOTE) SARS-CoV-2 target nucleic acids are NOT DETECTED. The SARS-CoV-2 RNA is generally detectable in upper and lower respiratory specimens during the acute phase of infection. Negative results do not preclude SARS-CoV-2 infection, do not rule out co-infections with other pathogens, and should not be used as the sole basis for treatment or other patient management decisions. Negative results must be combined with clinical observations, patient history, and epidemiological information. The expected result is Negative. Fact Sheet for Patients: SugarRoll.be Fact Sheet for Healthcare Providers: https://www.woods-mathews.com/ This test is not yet approved or cleared by the [place] FDA and  has been authorized for detection  and/or diagnosis of SARS-CoV-2 by FDA under an Emergency Use Authorization (EUA). This EUA will remain  in effect (meaning this test can be used) for the duration of the COVID-19 declaration under Section 56 4(b)(1) of the Act, 21 U.S.C. section 360bbb-3(b)(1), unless the authorization is terminated or revoked sooner. Performed at [facility], [place] [address], [place], [place] [address]   Culture, Urine     Status: None   Collection Time: 03/05/20  6:45 AM   Specimen: Urine, Catheterized  Result Value Ref Range Status   Specimen Description URINE, CATHETERIZED  Final   Special Requests NONE  Final   Culture   Final    NO GROWTH Performed at [place] [address], [place], [place] [address]    Report Status 03/06/2020 FINAL  Final    Lab Basic Metabolic Panel: Recent Labs  Lab 02/19/2020 1359 03/03/20 0351 03/04/20 0706 03/05/20 0314 03/06/20 0343  NA 137 140 146* 146* 146*  K 4.7 4.8 5.1 5.5* 4.8  CL 100 107 110 111 109  CO2 18* 16* 20* 18* 22  GLUCOSE 115* 69* 133* 125* 142*  BUN 57* 55* 72* 91* 105*  CREATININE 1.38* 2.02* 3.08* 3.83* 3.98*  CALCIUM 8.6* 7.9* 8.5* 9.0 9.0  MG  --   --  2.0 2.4 2.3  PHOS  --   --   --   --  5.8*   Liver Function Tests: Recent Labs  Lab 03/10/2020 1359 03/04/20 0706 03/05/20 0314 03/06/20 0343  AST 50* 27 86* 96*  ALT 39 24 44 55*  ALKPHOS 125 83 146* 227*  BILITOT 1.3* 1.2 1.3* 1.4*  PROT 6.9 5.8* 6.2* 6.2*  ALBUMIN 3.2* 2.5* 2.5* 2.5*   No results for input(s): LIPASE, AMYLASE in the last 168 hours. No results for input(s): AMMONIA in the last 168 hours. CBC: Recent Labs  Lab 02/23/2020 1359 03/03/20 0351 03/04/20 0706 03/05/20 0314 03/06/20 0343  WBC 10.7* 7.5 18.8* 14.4* 10.4  NEUTROABS 10.1*  --  17.1* 12.9* 8.5*  HGB 12.8 10.1* 10.0* 10.0* 8.6*  HCT 39.2 31.2* 31.5* 32.6* 27.9*  MCV 92.7 93.7 96.9 100.9* 99.6  PLT 66* 63* 68* 72* 81*   Cardiac Enzymes: Recent Labs  Lab 02/26/2020 1359   CKTOTAL 50   Sepsis  Labs: Recent Labs  Lab 02/16/2020 1359 02/25/2020 1359 02/17/2020 1548 03/03/20 0351 03/04/20 0706 03/05/20 0314 03/06/20 0343  PROCALCITON <0.10  --   --   --   --   --   --   WBC 10.7*   < >  --  7.5 18.8* 14.4* 10.4  LATICACIDVEN 1.4  --  1.0  --   --   --   --    < > = values in this interval not displayed.    Procedures/Operations  None  Kerney Elbe 03-28-20, 4:16 PM

## 2020-03-13 NOTE — Progress Notes (Signed)
I responded to consult for pastoral support. PT is transitioning with slow breathing. Family friend at bedside. She said she relieved Patients daughter at 8pm last night. She said patients daughter is having difficulty seeing her mother this way. I offered spiritual care with words of comfort, ministry of presence and prayer.   Palliative Care  Chaplain Resident Fidel Levy [phone]

## 2020-03-13 NOTE — Progress Notes (Signed)
Pt is now on comfort care only. We will be available if needed with questions.  Cardiology will sign off.   [name] [name] 03-24-20  8:22 AM

## 2020-03-13 DEATH — deceased

## 2020-04-13 IMAGING — CT CT ABD-PELV W/ CM
2 of 5 series · 16 of 46 positions shown, 18 images · IV contrast (APPLIED)
Comparison: CT abdomen pelvis dated August 25, 2017.

CLINICAL DATA: Acute abdominal pain.  Urinary tract infection.

EXAM:
CT ABDOMEN AND PELVIS WITH CONTRAST
TECHNIQUE: Multidetector CT imaging of the abdomen and pelvis was performed
using the standard protocol following bolus administration of
intravenous contrast.
CONTRAST:  100mL OMNIPAQUE IOHEXOL 300 MG/ML  SOLN

[Series 7: abdomen 5.0 (person_name) · axial · 0.73mm/px · z∈[+674,+1124]mm · 13 of 102 slices shown, 15 images]
[im 6/102  soft-tissue]
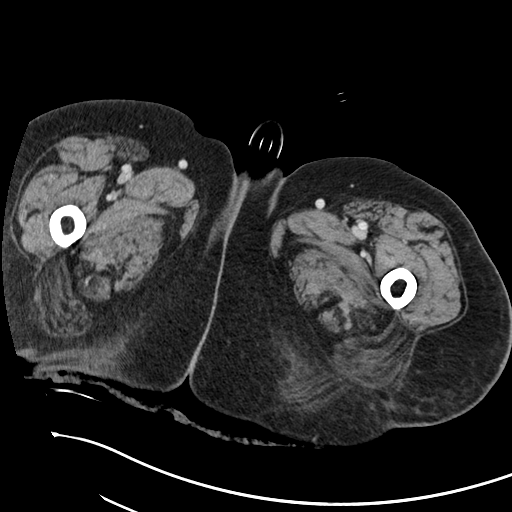
[im 6/102  bone]
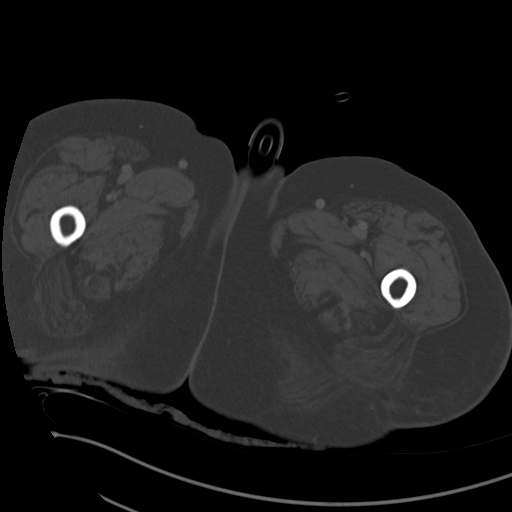
[im 12/102  soft-tissue]
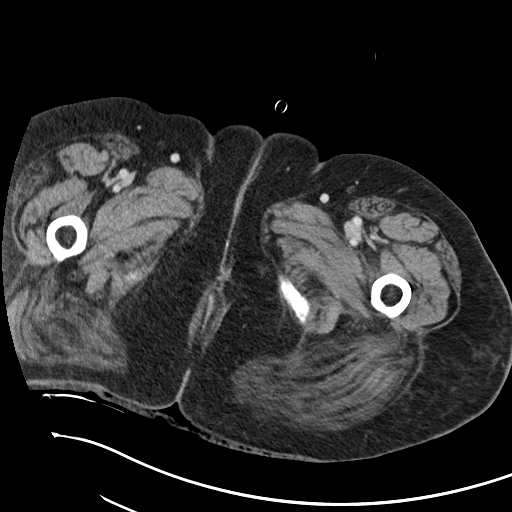
[im 24/102  soft-tissue]
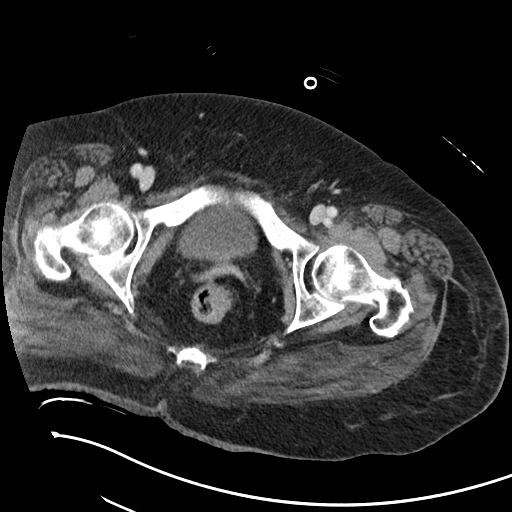
[im 30/102  soft-tissue]
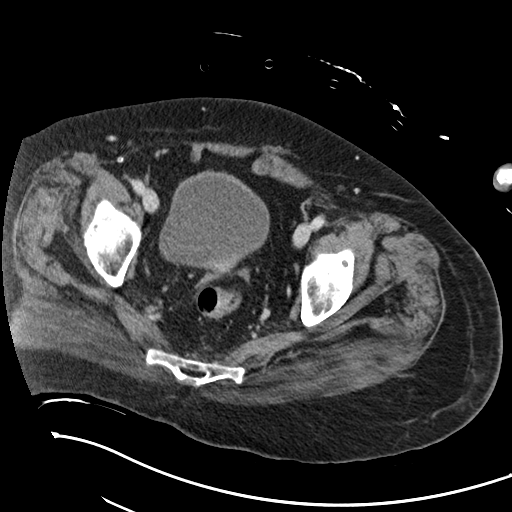
[im 36/102  soft-tissue]
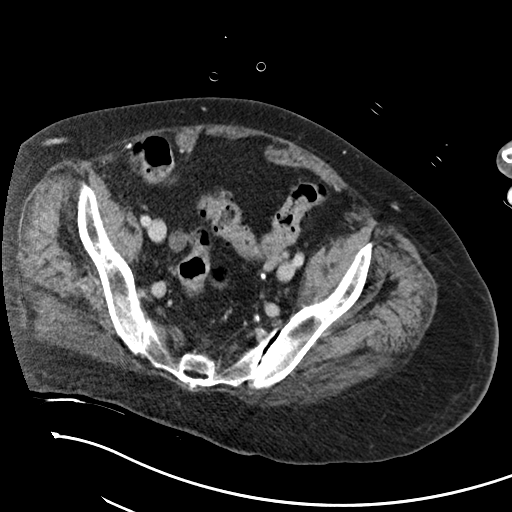
[im 42/102  soft-tissue]
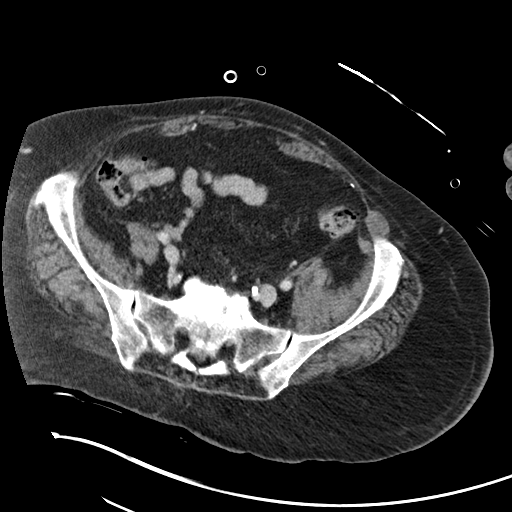
[im 54/102  soft-tissue]
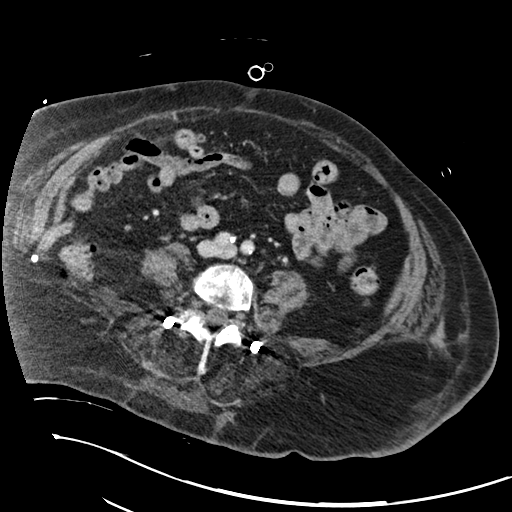
[im 60/102  soft-tissue]
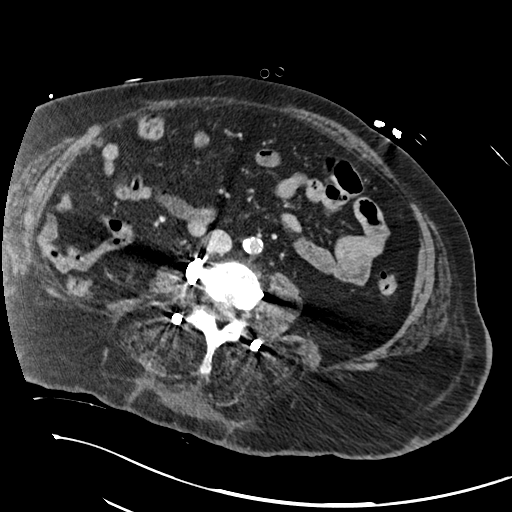
[im 66/102  soft-tissue]
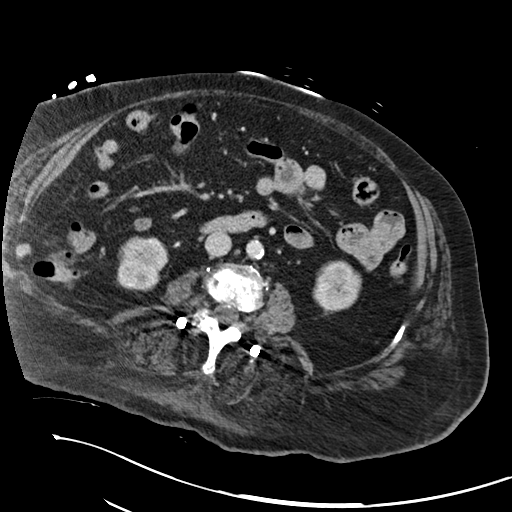
[im 66/102  bone]
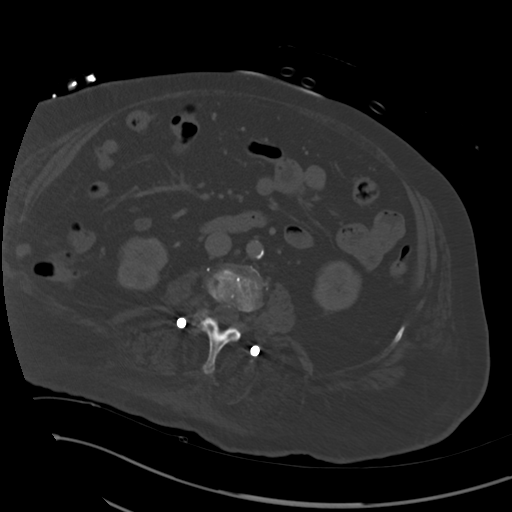
[im 72/102  soft-tissue]
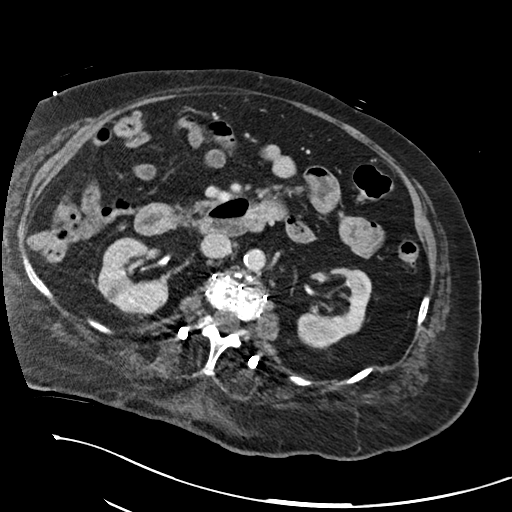
[im 78/102  soft-tissue]
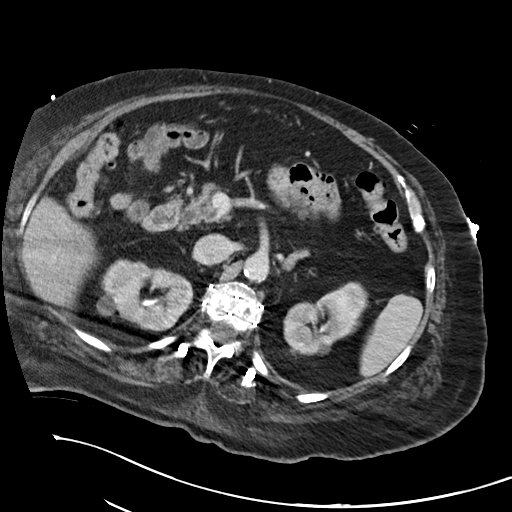
[im 90/102  soft-tissue]
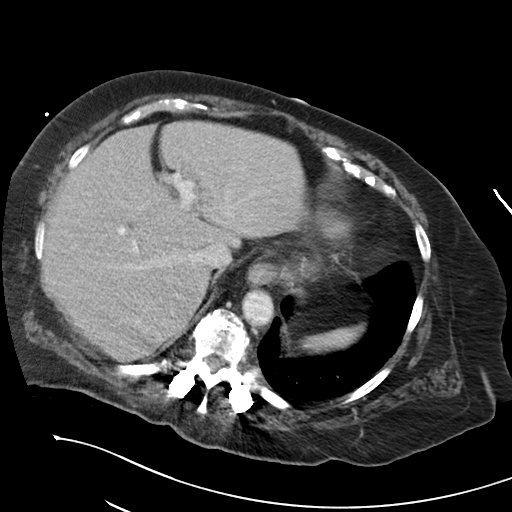
[im 96/102  soft-tissue]
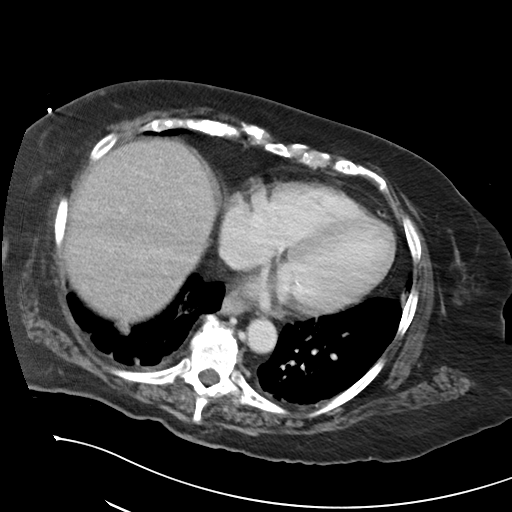

[Series 11: abdomen 3.0 (person_name) · coronal · 0.81mm/px · 3 of 103 slices shown]
[im 35/103  soft-tissue]
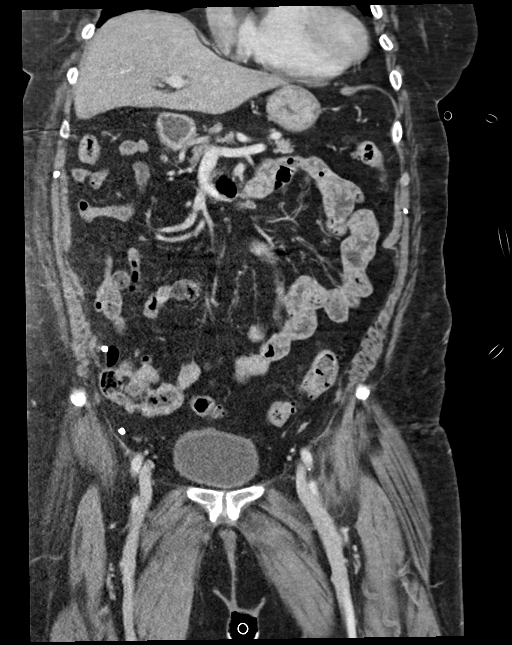
[im 46/103  soft-tissue]
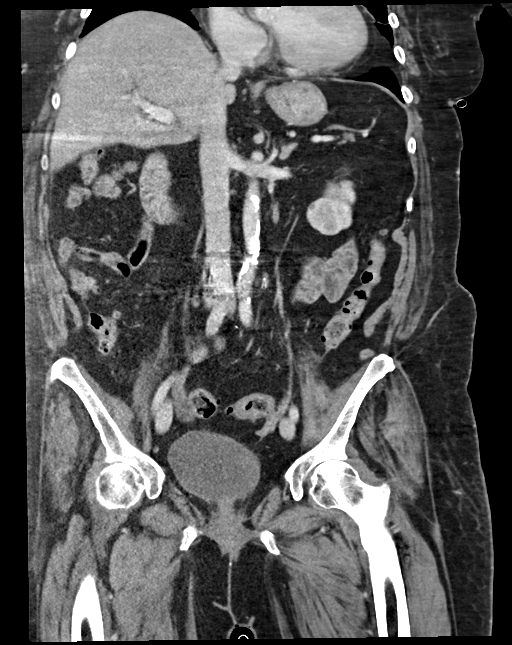
[im 57/103  soft-tissue]
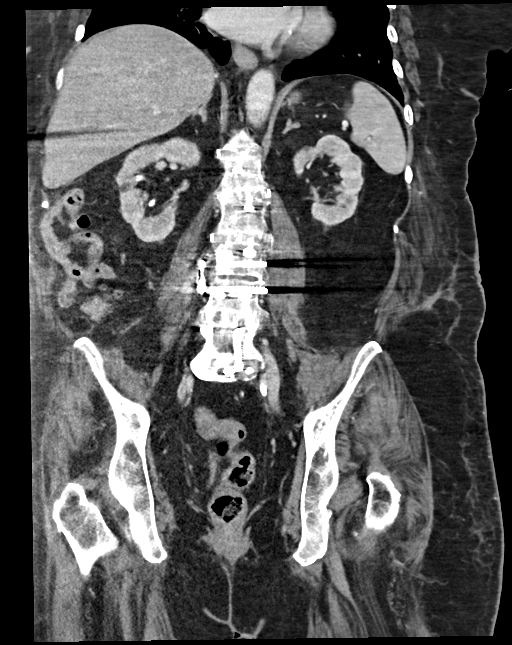

[16 of 46 positions shown; findings below may reference images not displayed]

FINDINGS: Lower chest: No acute abnormality. Chronic mild bibasilar
scarring/fibrosis.

Hepatobiliary: No focal liver abnormality is seen. Status post
cholecystectomy. No biliary dilatation.

Pancreas: Moderate atrophy. No ductal dilatation or surrounding
inflammatory changes.

Spleen: Normal in size without focal abnormality.

Adrenals/Urinary Tract: The adrenal glands are unremarkable. Small
bilateral renal cysts. No renal calculi or hydronephrosis. Small
amount of contrast in the bladder. The bladder is otherwise
unremarkable.

Stomach/Bowel: Stomach is within normal limits. Appendix appears
normal. No evidence of bowel wall thickening, distention, or
inflammatory changes. Unchanged duodenal diverticulum. Mild colonic
diverticulosis.

Vascular/Lymphatic: Aortic atherosclerosis. No enlarged abdominal or
pelvic lymph nodes.

Reproductive: Status post hysterectomy. No adnexal masses.

Other: No abdominal wall hernia or abnormality. No abdominopelvic
ascites. No pneumoperitoneum.

Musculoskeletal: No acute or significant osseous findings. Extensive
prior thoracolumbar fusion.
IMPRESSION: 1.  No acute intra-abdominal process.
2.  Aortic atherosclerosis ([id number]).
